# Patient Record
Sex: Male | Born: 1960 | Race: White | Hispanic: No | Marital: Single | Smoking: Never smoker
Health system: Southern US, Community
[De-identification: ages and names within clinical notes are randomized; demographics above are authoritative.]

## PROBLEM LIST (undated history)

## (undated) DIAGNOSIS — T4145XA Adverse effect of unspecified anesthetic, initial encounter: Secondary | ICD-10-CM

## (undated) DIAGNOSIS — R519 Headache, unspecified: Secondary | ICD-10-CM

## (undated) DIAGNOSIS — F200 Paranoid schizophrenia: Secondary | ICD-10-CM

## (undated) DIAGNOSIS — Z21 Asymptomatic human immunodeficiency virus [HIV] infection status: Secondary | ICD-10-CM

## (undated) DIAGNOSIS — E7800 Pure hypercholesterolemia, unspecified: Secondary | ICD-10-CM

## (undated) DIAGNOSIS — I509 Heart failure, unspecified: Secondary | ICD-10-CM

## (undated) DIAGNOSIS — J189 Pneumonia, unspecified organism: Secondary | ICD-10-CM

## (undated) DIAGNOSIS — E119 Type 2 diabetes mellitus without complications: Secondary | ICD-10-CM

## (undated) DIAGNOSIS — Z87442 Personal history of urinary calculi: Secondary | ICD-10-CM

## (undated) DIAGNOSIS — M1990 Unspecified osteoarthritis, unspecified site: Secondary | ICD-10-CM

## (undated) DIAGNOSIS — I639 Cerebral infarction, unspecified: Secondary | ICD-10-CM

## (undated) DIAGNOSIS — C469 Kaposi's sarcoma, unspecified: Secondary | ICD-10-CM

## (undated) DIAGNOSIS — F329 Major depressive disorder, single episode, unspecified: Secondary | ICD-10-CM

## (undated) DIAGNOSIS — K219 Gastro-esophageal reflux disease without esophagitis: Secondary | ICD-10-CM

## (undated) DIAGNOSIS — F32A Depression, unspecified: Secondary | ICD-10-CM

## (undated) DIAGNOSIS — I10 Essential (primary) hypertension: Secondary | ICD-10-CM

## (undated) DIAGNOSIS — F419 Anxiety disorder, unspecified: Secondary | ICD-10-CM

## (undated) DIAGNOSIS — F22 Delusional disorders: Secondary | ICD-10-CM

## (undated) DIAGNOSIS — D649 Anemia, unspecified: Secondary | ICD-10-CM

## (undated) DIAGNOSIS — G459 Transient cerebral ischemic attack, unspecified: Secondary | ICD-10-CM

## (undated) DIAGNOSIS — N189 Chronic kidney disease, unspecified: Secondary | ICD-10-CM

## (undated) DIAGNOSIS — G4730 Sleep apnea, unspecified: Secondary | ICD-10-CM

## (undated) DIAGNOSIS — B20 Human immunodeficiency virus [HIV] disease: Secondary | ICD-10-CM

## (undated) DIAGNOSIS — I219 Acute myocardial infarction, unspecified: Secondary | ICD-10-CM

## (undated) HISTORY — PX: DG TEETH FULL: HXRAD118

## (undated) HISTORY — DX: Unspecified osteoarthritis, unspecified site: M19.90

## (undated) HISTORY — PX: CARDIAC CATHETERIZATION: SHX172

## (undated) HISTORY — DX: Heart failure, unspecified: I50.9

---

## 1998-01-11 ENCOUNTER — Encounter: Admission: RE | Admit: 1998-01-11 | Discharge: 1998-01-11 | Payer: Self-pay | Admitting: Hematology and Oncology

## 1998-08-21 ENCOUNTER — Ambulatory Visit (HOSPITAL_COMMUNITY): Admission: RE | Admit: 1998-08-21 | Discharge: 1998-08-21 | Payer: Self-pay | Admitting: Internal Medicine

## 1998-08-21 ENCOUNTER — Encounter: Admission: RE | Admit: 1998-08-21 | Discharge: 1998-08-21 | Payer: Self-pay | Admitting: Internal Medicine

## 1998-09-11 ENCOUNTER — Encounter: Admission: RE | Admit: 1998-09-11 | Discharge: 1998-09-11 | Payer: Self-pay | Admitting: Internal Medicine

## 2000-01-14 ENCOUNTER — Encounter: Payer: Self-pay | Admitting: Hematology and Oncology

## 2000-01-14 ENCOUNTER — Emergency Department (HOSPITAL_COMMUNITY): Admission: EM | Admit: 2000-01-14 | Discharge: 2000-01-14 | Payer: Self-pay | Admitting: Emergency Medicine

## 2000-01-14 ENCOUNTER — Ambulatory Visit (HOSPITAL_COMMUNITY): Admission: RE | Admit: 2000-01-14 | Discharge: 2000-01-14 | Payer: Self-pay | Admitting: Hematology and Oncology

## 2000-01-14 ENCOUNTER — Encounter: Admission: RE | Admit: 2000-01-14 | Discharge: 2000-01-14 | Payer: Self-pay | Admitting: Hematology and Oncology

## 2000-02-11 ENCOUNTER — Encounter: Admission: RE | Admit: 2000-02-11 | Discharge: 2000-02-11 | Payer: Self-pay | Admitting: Internal Medicine

## 2000-09-17 ENCOUNTER — Encounter: Admission: RE | Admit: 2000-09-17 | Discharge: 2000-09-17 | Payer: Self-pay | Admitting: Hematology and Oncology

## 2004-07-12 ENCOUNTER — Inpatient Hospital Stay: Payer: Self-pay | Admitting: Internal Medicine

## 2004-07-15 ENCOUNTER — Ambulatory Visit: Payer: Self-pay | Admitting: Unknown Physician Specialty

## 2004-08-19 ENCOUNTER — Ambulatory Visit: Payer: Self-pay | Admitting: Internal Medicine

## 2004-08-28 ENCOUNTER — Ambulatory Visit: Payer: Medicaid Other | Admitting: Internal Medicine

## 2006-04-06 ENCOUNTER — Ambulatory Visit: Payer: Self-pay | Admitting: Internal Medicine

## 2007-03-04 ENCOUNTER — Ambulatory Visit: Payer: Self-pay

## 2007-04-27 ENCOUNTER — Ambulatory Visit: Payer: Self-pay | Admitting: Internal Medicine

## 2007-05-17 ENCOUNTER — Ambulatory Visit: Payer: Self-pay | Admitting: Internal Medicine

## 2007-12-14 ENCOUNTER — Ambulatory Visit: Payer: Self-pay

## 2007-12-21 ENCOUNTER — Emergency Department: Payer: Self-pay | Admitting: Emergency Medicine

## 2008-01-11 ENCOUNTER — Ambulatory Visit: Payer: Self-pay | Admitting: Internal Medicine

## 2008-02-21 ENCOUNTER — Ambulatory Visit: Payer: Self-pay | Admitting: Internal Medicine

## 2008-02-29 ENCOUNTER — Ambulatory Visit: Payer: Self-pay | Admitting: Internal Medicine

## 2008-03-30 ENCOUNTER — Ambulatory Visit: Payer: Self-pay | Admitting: Internal Medicine

## 2009-08-14 ENCOUNTER — Ambulatory Visit: Payer: Self-pay | Admitting: Gastroenterology

## 2009-09-27 ENCOUNTER — Ambulatory Visit: Payer: Self-pay | Admitting: Gastroenterology

## 2009-11-27 ENCOUNTER — Emergency Department: Payer: Self-pay | Admitting: Unknown Physician Specialty

## 2009-12-18 ENCOUNTER — Ambulatory Visit: Payer: Self-pay | Admitting: Surgery

## 2010-04-04 ENCOUNTER — Emergency Department: Payer: Self-pay | Admitting: Emergency Medicine

## 2010-07-21 ENCOUNTER — Emergency Department: Payer: Self-pay | Admitting: Internal Medicine

## 2011-04-20 ENCOUNTER — Emergency Department: Payer: Self-pay | Admitting: *Deleted

## 2011-05-11 ENCOUNTER — Emergency Department: Payer: Self-pay | Admitting: Emergency Medicine

## 2011-07-05 ENCOUNTER — Emergency Department: Payer: Self-pay | Admitting: Emergency Medicine

## 2011-07-22 ENCOUNTER — Ambulatory Visit: Payer: Self-pay | Admitting: Gastroenterology

## 2011-09-25 ENCOUNTER — Emergency Department: Payer: Self-pay | Admitting: Emergency Medicine

## 2011-09-25 LAB — URINALYSIS, COMPLETE
Bilirubin,UR: NEGATIVE
Ketone: NEGATIVE
Nitrite: NEGATIVE
Protein: 100
RBC,UR: 2 /HPF (ref 0–5)
Specific Gravity: 1.025 (ref 1.003–1.030)
Squamous Epithelial: <1

## 2011-11-07 ENCOUNTER — Emergency Department: Payer: Self-pay | Admitting: *Deleted

## 2011-11-09 ENCOUNTER — Emergency Department: Payer: Self-pay | Admitting: Internal Medicine

## 2012-03-15 ENCOUNTER — Observation Stay: Payer: Self-pay | Admitting: Internal Medicine

## 2012-03-15 LAB — MAGNESIUM: Magnesium: 1.3 mg/dL — ABNORMAL LOW

## 2012-03-15 LAB — DRUG SCREEN, URINE

## 2012-03-15 LAB — CK TOTAL AND CKMB (NOT AT ARMC)
CK, Total: 45 U/L (ref 35–232)
CK, Total: 48 U/L (ref 35–232)
CK, Total: 73 U/L (ref 35–232)
CK-MB: 2.1 ng/mL (ref 0.5–3.6)
CK-MB: 2.8 ng/mL (ref 0.5–3.6)
CK-MB: 3.5 ng/mL (ref 0.5–3.6)

## 2012-03-15 LAB — COMPREHENSIVE METABOLIC PANEL
Albumin: 2.8 g/dL — ABNORMAL LOW (ref 3.4–5.0)
Alkaline Phosphatase: 86 U/L (ref 50–136)
Anion Gap: 13 (ref 7–16)
BUN: 22 mg/dL — ABNORMAL HIGH (ref 7–18)
Bilirubin,Total: 0.3 mg/dL (ref 0.2–1.0)
Calcium, Total: 8.9 mg/dL (ref 8.5–10.1)
Chloride: 101 mmol/L (ref 98–107)
Co2: 27 mmol/L (ref 21–32)
Creatinine: 1.35 mg/dL — ABNORMAL HIGH (ref 0.60–1.30)
EGFR (African American): >60
EGFR (Non-African Amer.): 60 — ABNORMAL LOW
Glucose: 220 mg/dL — ABNORMAL HIGH (ref 65–99)
Osmolality: 291 (ref 275–301)
Potassium: 3.7 mmol/L (ref 3.5–5.1)
SGOT(AST): 11 U/L — ABNORMAL LOW (ref 15–37)
SGPT (ALT): 37 U/L (ref 12–78)
Sodium: 141 mmol/L (ref 136–145)
Total Protein: 5.9 g/dL — ABNORMAL LOW (ref 6.4–8.2)

## 2012-03-15 LAB — CBC
HCT: 39.5 % — ABNORMAL LOW (ref 40.0–52.0)
HGB: 13.8 g/dL (ref 13.0–18.0)
MCH: 34.9 pg — ABNORMAL HIGH (ref 26.0–34.0)
MCHC: 35 g/dL (ref 32.0–36.0)
MCV: 100 fL (ref 80–100)
Platelet: 188 10*3/uL (ref 150–440)
RBC: 3.96 10*6/uL — ABNORMAL LOW (ref 4.40–5.90)
RDW: 14 % (ref 11.5–14.5)
WBC: 7.2 10*3/uL (ref 3.8–10.6)

## 2012-03-15 LAB — TROPONIN I
Troponin-I: <0.02 ng/mL
Troponin-I: <0.02 ng/mL

## 2012-03-15 LAB — TSH: Thyroid Stimulating Horm: 0.68 u[IU]/mL

## 2012-03-16 LAB — CBC WITH DIFFERENTIAL/PLATELET
Basophil #: 0 10*3/uL (ref 0.0–0.1)
Eosinophil #: 0.1 10*3/uL (ref 0.0–0.7)
Eosinophil %: 1.2 %
HGB: 14.5 g/dL (ref 13.0–18.0)
Lymphocyte %: 35.7 %
MCH: 34.8 pg — ABNORMAL HIGH (ref 26.0–34.0)
MCHC: 35 g/dL (ref 32.0–36.0)
Monocyte #: 0.6 x10 3/mm (ref 0.2–1.0)
Neutrophil %: 54.3 %
Platelet: 211 10*3/uL (ref 150–440)

## 2012-03-16 LAB — BASIC METABOLIC PANEL
Co2: 30 mmol/L (ref 21–32)
Creatinine: 1.04 mg/dL (ref 0.60–1.30)
EGFR (African American): >60
EGFR (Non-African Amer.): >60
Potassium: 3.9 mmol/L (ref 3.5–5.1)
Sodium: 136 mmol/L (ref 136–145)

## 2012-04-15 ENCOUNTER — Emergency Department: Payer: Self-pay | Admitting: Unknown Physician Specialty

## 2012-04-15 LAB — URINALYSIS, COMPLETE
Bacteria: NONE SEEN
Glucose,UR: 150 mg/dL (ref 0–75)
Nitrite: NEGATIVE
Ph: 5 (ref 4.5–8.0)
Protein: 100
Specific Gravity: 1.009 (ref 1.003–1.030)
Squamous Epithelial: NONE SEEN
WBC UR: 1 /HPF (ref 0–5)

## 2012-04-15 LAB — COMPREHENSIVE METABOLIC PANEL
Albumin: 3.5 g/dL (ref 3.4–5.0)
Alkaline Phosphatase: 122 U/L (ref 50–136)
Anion Gap: 11 (ref 7–16)
BUN: 20 mg/dL — ABNORMAL HIGH (ref 7–18)
Bilirubin,Total: 0.5 mg/dL (ref 0.2–1.0)
Calcium, Total: 9.6 mg/dL (ref 8.5–10.1)
Chloride: 97 mmol/L — ABNORMAL LOW (ref 98–107)
Creatinine: 1.05 mg/dL (ref 0.60–1.30)
EGFR (Non-African Amer.): >60
Glucose: 180 mg/dL — ABNORMAL HIGH (ref 65–99)
Potassium: 3.6 mmol/L (ref 3.5–5.1)
Sodium: 136 mmol/L (ref 136–145)
Total Protein: 7.3 g/dL (ref 6.4–8.2)

## 2012-04-15 LAB — CBC WITH DIFFERENTIAL/PLATELET
Basophil #: 0.1 10*3/uL (ref 0.0–0.1)
Eosinophil %: 1.6 %
HCT: 44 % (ref 40.0–52.0)
Lymphocyte #: 3.2 10*3/uL (ref 1.0–3.6)
MCV: 98 fL (ref 80–100)
Monocyte #: 0.8 x10 3/mm (ref 0.2–1.0)
Monocyte %: 8.9 %
Neutrophil %: 52.3 %
Platelet: 260 10*3/uL (ref 150–440)
RBC: 4.48 10*6/uL (ref 4.40–5.90)
RDW: 13.6 % (ref 11.5–14.5)
WBC: 8.7 10*3/uL (ref 3.8–10.6)

## 2012-04-15 LAB — TROPONIN I: Troponin-I: <0.02 ng/mL

## 2012-12-26 ENCOUNTER — Observation Stay: Payer: Self-pay | Admitting: Internal Medicine

## 2012-12-26 LAB — CBC
HGB: 13.5 g/dL (ref 13.0–18.0)
MCH: 33.1 pg (ref 26.0–34.0)
RBC: 4.07 10*6/uL — ABNORMAL LOW (ref 4.40–5.90)
RDW: 13.1 % (ref 11.5–14.5)
WBC: 8.4 10*3/uL (ref 3.8–10.6)

## 2012-12-26 LAB — URINALYSIS, COMPLETE
Glucose,UR: NEGATIVE mg/dL (ref 0–75)
Ketone: NEGATIVE
Nitrite: NEGATIVE
Ph: 6 (ref 4.5–8.0)
RBC,UR: 3 /HPF (ref 0–5)
Squamous Epithelial: NONE SEEN
WBC UR: 1 /HPF (ref 0–5)

## 2012-12-26 LAB — COMPREHENSIVE METABOLIC PANEL
Albumin: 3 g/dL — ABNORMAL LOW (ref 3.4–5.0)
BUN: 10 mg/dL (ref 7–18)
Calcium, Total: 9 mg/dL (ref 8.5–10.1)
Creatinine: 0.99 mg/dL (ref 0.60–1.30)
EGFR (African American): >60
Glucose: 119 mg/dL — ABNORMAL HIGH (ref 65–99)
Osmolality: 276 (ref 275–301)
Potassium: 4.5 mmol/L (ref 3.5–5.1)
SGOT(AST): 29 U/L (ref 15–37)
Sodium: 138 mmol/L (ref 136–145)
Total Protein: 6.7 g/dL (ref 6.4–8.2)

## 2012-12-26 LAB — CK TOTAL AND CKMB (NOT AT ARMC)
CK, Total: 65 U/L (ref 35–232)
CK-MB: 2.2 ng/mL (ref 0.5–3.6)

## 2012-12-27 LAB — BASIC METABOLIC PANEL
Anion Gap: 6 — ABNORMAL LOW (ref 7–16)
Calcium, Total: 9.1 mg/dL (ref 8.5–10.1)
EGFR (Non-African Amer.): >60
Potassium: 3.9 mmol/L (ref 3.5–5.1)
Sodium: 138 mmol/L (ref 136–145)

## 2012-12-27 LAB — CBC WITH DIFFERENTIAL/PLATELET
Eosinophil %: 4.1 %
MCH: 33.3 pg (ref 26.0–34.0)
MCV: 94 fL (ref 80–100)
Monocyte #: 0.7 x10 3/mm (ref 0.2–1.0)
Neutrophil #: 3.8 10*3/uL (ref 1.4–6.5)
RDW: 13 % (ref 11.5–14.5)
WBC: 7.2 10*3/uL (ref 3.8–10.6)

## 2012-12-27 LAB — CK TOTAL AND CKMB (NOT AT ARMC)
CK, Total: 55 U/L (ref 35–232)
CK-MB: 1.4 ng/mL (ref 0.5–3.6)

## 2012-12-27 LAB — TROPONIN I: Troponin-I: <0.02 ng/mL

## 2013-02-20 ENCOUNTER — Emergency Department: Payer: Self-pay | Admitting: Emergency Medicine

## 2013-02-20 LAB — COMPREHENSIVE METABOLIC PANEL
Albumin: 2.9 g/dL — ABNORMAL LOW (ref 3.4–5.0)
Alkaline Phosphatase: 130 U/L (ref 50–136)
Bilirubin,Total: 0.3 mg/dL (ref 0.2–1.0)
Calcium, Total: 8.6 mg/dL (ref 8.5–10.1)
Chloride: 105 mmol/L (ref 98–107)
Co2: 26 mmol/L (ref 21–32)
EGFR (African American): >60
EGFR (Non-African Amer.): >60
Glucose: 160 mg/dL — ABNORMAL HIGH (ref 65–99)
Osmolality: 279 (ref 275–301)
SGOT(AST): 17 U/L (ref 15–37)
SGPT (ALT): 23 U/L (ref 12–78)
Sodium: 138 mmol/L (ref 136–145)
Total Protein: 6.5 g/dL (ref 6.4–8.2)

## 2013-02-20 LAB — CBC
HCT: 38.8 % — ABNORMAL LOW (ref 40.0–52.0)
HGB: 13.4 g/dL (ref 13.0–18.0)
MCV: 93 fL (ref 80–100)
Platelet: 200 10*3/uL (ref 150–440)
WBC: 8.3 10*3/uL (ref 3.8–10.6)

## 2013-02-20 LAB — URINALYSIS, COMPLETE
Bacteria: NONE SEEN
Blood: NEGATIVE
Leukocyte Esterase: NEGATIVE
Nitrite: NEGATIVE
Protein: 150
RBC,UR: NONE SEEN /HPF (ref 0–5)
WBC UR: <1 /HPF (ref 0–5)

## 2013-02-21 ENCOUNTER — Ambulatory Visit: Payer: Self-pay | Admitting: Internal Medicine

## 2013-03-25 ENCOUNTER — Emergency Department: Payer: Self-pay | Admitting: Emergency Medicine

## 2013-03-25 LAB — COMPREHENSIVE METABOLIC PANEL
Alkaline Phosphatase: 162 U/L — ABNORMAL HIGH (ref 50–136)
Anion Gap: 8 (ref 7–16)
BUN: 15 mg/dL (ref 7–18)
Bilirubin,Total: 0.3 mg/dL (ref 0.2–1.0)
Calcium, Total: 9.4 mg/dL (ref 8.5–10.1)
Chloride: 102 mmol/L (ref 98–107)
Co2: 26 mmol/L (ref 21–32)
Creatinine: 1.26 mg/dL (ref 0.60–1.30)
EGFR (African American): >60
EGFR (Non-African Amer.): >60
Glucose: 249 mg/dL — ABNORMAL HIGH (ref 65–99)
Osmolality: 281 (ref 275–301)
Potassium: 4.2 mmol/L (ref 3.5–5.1)
SGOT(AST): 19 U/L (ref 15–37)
SGPT (ALT): 27 U/L (ref 12–78)
Sodium: 136 mmol/L (ref 136–145)

## 2013-03-25 LAB — URINALYSIS, COMPLETE
Bilirubin,UR: NEGATIVE
Glucose,UR: >=500 mg/dL (ref 0–75)
Hyaline Cast: 2
Ketone: NEGATIVE
Leukocyte Esterase: NEGATIVE
Nitrite: NEGATIVE
Ph: 5 (ref 4.5–8.0)
Protein: >=500

## 2013-03-25 LAB — CBC
HCT: 42.1 % (ref 40.0–52.0)
HGB: 14.5 g/dL (ref 13.0–18.0)
MCH: 31.7 pg (ref 26.0–34.0)
MCHC: 34.4 g/dL (ref 32.0–36.0)
RBC: 4.56 10*6/uL (ref 4.40–5.90)
WBC: 11.4 10*3/uL — ABNORMAL HIGH (ref 3.8–10.6)

## 2013-03-26 LAB — URINE CULTURE

## 2014-03-22 ENCOUNTER — Emergency Department: Payer: Self-pay | Admitting: Emergency Medicine

## 2014-04-03 ENCOUNTER — Emergency Department: Payer: Self-pay | Admitting: Emergency Medicine

## 2014-06-07 ENCOUNTER — Emergency Department: Payer: Self-pay | Admitting: Emergency Medicine

## 2014-06-07 LAB — CBC
HCT: 40 % (ref 40.0–52.0)
HGB: 13.5 g/dL (ref 13.0–18.0)
MCH: 33.5 pg (ref 26.0–34.0)
MCHC: 33.7 g/dL (ref 32.0–36.0)
MCV: 99 fL (ref 80–100)
Platelet: 195 10*3/uL (ref 150–440)
RBC: 4.02 10*6/uL — ABNORMAL LOW (ref 4.40–5.90)
RDW: 13 % (ref 11.5–14.5)
WBC: 8 10*3/uL (ref 3.8–10.6)

## 2014-06-07 LAB — COMPREHENSIVE METABOLIC PANEL
ALK PHOS: 84 U/L
AST: 13 U/L — AB (ref 15–37)
Albumin: 3.4 g/dL (ref 3.4–5.0)
Anion Gap: 6 — ABNORMAL LOW (ref 7–16)
BUN: 18 mg/dL (ref 7–18)
Bilirubin,Total: 0.3 mg/dL (ref 0.2–1.0)
CALCIUM: 9 mg/dL (ref 8.5–10.1)
CHLORIDE: 105 mmol/L (ref 98–107)
CO2: 29 mmol/L (ref 21–32)
Creatinine: 1.19 mg/dL (ref 0.60–1.30)
EGFR (African American): >60
EGFR (Non-African Amer.): >60
Glucose: 176 mg/dL — ABNORMAL HIGH (ref 65–99)
OSMOLALITY: 286 (ref 275–301)
POTASSIUM: 3.8 mmol/L (ref 3.5–5.1)
SGPT (ALT): 19 U/L
Sodium: 140 mmol/L (ref 136–145)
TOTAL PROTEIN: 6.9 g/dL (ref 6.4–8.2)

## 2014-10-17 NOTE — H&P (Signed)
PATIENT NAME:  Danny Cook, RISDON MR#:  [id number] DATE OF BIRTH:  05-18-1961  DATE OF ADMISSION:  03/15/2012  PRIMARY CARE PHYSICIAN: Dr. Ginette Pitman ER PHYSICIAN: Dr. Beather Arbour ADMITTING PHYSICIAN: Dr. Pearletha Furl  PRESENTING COMPLAINT: Chest pain which started this evening.   HISTORY OF PRESENT ILLNESS: Patient is a 54 year old male who was in his usual state of health until this evening when he started having chest pain. Pain was sharp in nature, precordial in location, intensity 7/10 radiating up to the neck, associated dizziness, nausea but no vomiting, no loss of consciousness. Denies PND, orthopnea, or pedal edema. Denies any prior episodes of similar chest pain. Had some episodes of dizziness which patient states is chronic for him. However, with recurrence of the pain, duration more than 50 minutes he presented to the Emergency Room where he was given nitrates and has some resolution of the pain. Patient took some aspirin at home prior to arrival in the ER. Currently he is chest pain-free. Workup included a chest x-ray which showed no abnormality and EKG which shows sinus tachycardia, rate of 103. Blood pressure is stable. Denies any recent change in medication, long distance travel, sick contact. No rashes on the anterior chest wall.   REVIEW OF SYSTEMS: CONSTITUTIONAL: Denies any fever, fatigue. Denies any weakness. Admits to chest pain only. No weight loss or weight gain. EYES: No blurred vision, redness or discharge. ENT: No tinnitus, epistaxis, or difficulty swallowing. RESPIRATORY: Has some cough which is nonproductive. Denies any painful respiration. CARDIOVASCULAR: Positive for the chest pain but denies any syncopal episode, palpitations, PND, orthopnea. GASTROINTESTINAL: Positive for nausea but no vomiting or diarrhea. Admits occasional abdominal pain with feeds. No change in bowel habits. GENITOURINARY: No dysuria, frequency, incontinence. ENDOCRINE: No polyuria, polydipsia, heat or cold intolerance.  HEMATOLOGIC: No anemia, easy bruising, bleeding, or swollen glands. SKIN: No rashes, change in hair or skin texture. MUSCULOSKELETAL: No joint pain, redness, swelling, or limited activity. NEUROLOGIC: No numbness, weakness, headaches, seizure, or memory loss. PSYCH: Admits of anxiety but no depression or nervousness.   PAST MEDICAL HISTORY:  1. Type 2 diabetes.  2. Hyperlipidemia.  3. Obstructive sleep apnea. 4. Gastroesophageal reflux disease. 5. Generalized anxiety with paranoid ideation. 6. History of depression. 7. History of HIV since 1990, being followed by Dr. Clayborn Bigness, infection disease, currently on HAART therapy. Viral load undetectable and CD4 count more than 1000. 8. Had history of chronic kidney disease secondary to Tenofovir medication with improvement in renal function after drug was discontinued.  9. History of panic disorder with agoraphobia and chronic posttraumatic stress disorder.   PAST SURGICAL HISTORY: No significant past surgical history.   SOCIAL HISTORY: Lives alone. Denies any alcohol, tobacco, or recreational drug use. Has remote history of drug use more than 20 years ago. Currently unemployed. 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    FAMILY HISTORY: Positive for anxiety and depression in the mother. Coronary artery disease in both parents. Colon cancer.   ALLERGIES: Combivir, Viread.    MEDICATIONS:  1. Aspirin 325 mg daily.  2. Meloxicam 15 mg daily.  3. Lisinopril 2.5 mg once a day. 4. Clonazepam 1 mg t.i.d.   5. Mirtazapine 30 mg at bedtime.  6. Metformin 500 mg twice daily.  7. Meclizine 25 mg twice daily p.r.n.  8. Promethazine 25 mg q.6h.  9. Crestor 20 mg at bedtime. 10. Niaspan ER 500 mg at bedtime.  11. Trilipix 45 mg daily.  12.  Abilify 10 mg daily.  13. Epzicom 600 mg/300 mg 1 tablet twice a day. 14. Kaletra 200 mg/50 mg 2 tablets twice a day.  15. Nevirapine 200 mg twice a day. 16. Viramune 200 mg  twice daily.  17. Hydroxyzine 25 mg 1 to 2 tablets 4 times daily p.r.n. for itching.  18. Lunesta 3 mg at bedtime.  19. ProAir inhaler 2 puffs q.6 hours p.r.n.  20. Symbicort 80 mcg 2 puffs twice daily.  21. Lasix 40 mg daily.  22. Potassium chloride 10 mEq twice daily.  23. Flexeril 10 mg twice daily p.r.n.  24. Omeprazole 40 mg twice daily.  25. Lovaza 500 mg daily. 26. Paroxetine 4 mg daily.   PHYSICAL EXAMINATION:  VITAL SIGNS: Temperature record 95.4 on arrival now is 98, pulse 103, respiratory rate 20, blood pressure 145/86, sats 95% on room air.   GENERAL: Obese, middle-aged male lying comfortably on the gurney, awake, alert, oriented to time, place, and person, in no distress.   HEENT: Atraumatic, normocephalic. Pupils equal, reactive to light and accommodation. Extraocular movement intact. Mucous membranes pink, moist.   NECK: Supple. No JV distention.   CHEST: Good air entry. Clear to auscultation.   HEART: Regular rate and rhythm. No murmur.   ABDOMEN: Obese, distended, moves with respiration, nontender. Bowel sounds normoactive. No organomegaly.   EXTREMITIES: No edema. No clubbing. No deformity.   NEUROLOGICAL: Cranial nerves II through XII grossly intact. No focal deficits.   PSYCH: Affect appropriate to situation.   LABORATORY, DIAGNOSTIC, AND RADIOLOGICAL DATA: EKG shows sinus tachycardia, rate of 100, lateral ST depression in V4 to V6, left atrial enlargement. Chest x-ray showed no obvious infiltrates. CBC: White count 7, hemoglobin 13, platelets 188. Chemistry unremarkable. Creatinine 1.3 from baseline 1.2 from two years ago. BUN 22, glucose 220, calcium 8.9, potassium 3.7, sodium 141, albumin 2.8. LFTs are not elevated. CK 73. Troponin negative. Records shows negative stress test in 2006 during which time he had presented to the Emergency Room with similar complaints.   IMPRESSION:  1. Chest pain, to rule out ACS versus arrhythmia, consider anxiety with  gastritis.  2. Uncontrolled diabetes.  3. Obstructive sleep apnea, not on CPAP or oxygen.  4. Gastroesophageal reflux disease probably exacerbated by NSAID use.  5. Hyperlipidemia, stable.  6. History of HIV infection on HAART therapy with undetectable viral load and CD4 count more than 1000 from last check two months ago according to patient.  7. History of anxiety, depression, panic disorder.   PLAN:  1. Admit to general medical floor for serial cardiac enzymes. Check TSH, magnesium.  2. Check d-dimer and if elevated for CT angiogram.  3. Aspirin, nitroglycerin p.r.n. morphine. Telemonitoring overnight. Serial cardiac enzymes. Stress test in a.m.  4. Resume rest of outpatient medications and adjust as needed. Will hold his Lasix tonight. Hold the Glucophage. Hold meloxicam, potassium chloride for now. 5. GI prophylaxis with Protonix. Deep venous thrombosis prophylaxis with Lovenox.  6. CODE STATUS: FULL CODE.   TOTAL PATIENT CARE TIME: 50 minutes. Will transfer to Dr. Linton Ham service in the morning.   ____________________________ Jules Husbands. Pearletha Furl, MD mia:cms D: 03/15/2012 01:53:30 ET T: 03/15/2012 06:57:48 ET JOB#: [id number]  cc: Hamdi Kley I. Pearletha Furl, MD, <Dictator> Tracie Harrier, MD Carola Frost MD ELECTRONICALLY SIGNED 03/15/2012 22:33

## 2014-10-17 NOTE — Discharge Summary (Signed)
PATIENT NAME:  Danny Cook MR#:  [id number] DATE OF BIRTH:  05/30/1961  DATE OF ADMISSION:  03/15/2012 DATE OF DISCHARGE:  03/16/2012  DISCHARGE DIAGNOSES:  1. Atypical chest pain, myocardial infarction ruled out. Lexiscan stress test negative for ischemia or arrhythmia.  2. Anxiety.  3. Depression.  4. Type 2 diabetes.  5. History of hyperlipidemia.  6. Obstructive sleep apnea syndrome.  7. Gastroesophageal reflux disease.   CHIEF COMPLAINT: Chest pain.   HISTORY OF PRESENT ILLNESS: Danny Cook is a 54 year old male who presented to the ED complaining of chest pain. The patient states that the pain was around 7/10 in intensity, radiating to the neck associated with dizziness, nausea, and described as sharp in nature. The patient denies any previous such episodes. Denies any pedal edema, orthopnea or paroxysmal nocturnal dyspnea. The patient took aspirin at home and was chest pain free when he came to the Emergency Room.   PAST MEDICAL HISTORY:  1. Type 2 diabetes. 2. Hyperlipidemia. 3. Obstructive sleep apnea.   4. Gastroesophageal reflux disease.  5. Anxiety. 6. Paranoid ideation. 7. History of depression. 8. History of human immunodeficiency virus followed by Dr. Clayborn Bigness. 9. History of chronic kidney disease. 10. History of panic disorder and agoraphobia and chronic posttraumatic stress disorder.   PHYSICAL EXAMINATION: GENERAL: He was anxious. VS: Afebrile, respirations 20, pulse 103, blood pressure 144/86, oxygen saturation 95%, not in distress. HEENT: Normocephalic, atraumatic. Mucous membranes were moist. NECK: Supple. No JVD. HEART: S1, S2. LUNGS: Clear to auscultation. ABDOMEN: Soft, obese, nontender. No masses felt. EXTREMITIES: No edema. NEUROLOGIC: Nonfocal.   LABORATORY, RADIOLOGICAL AND DIAGNOSTIC DATA: EKG showed sinus tachycardia with heart rate of 100. ST depression was noted in V4-V6 with left atrial enlargement. Chest x-ray did not show any infiltrates.  Hemoglobin 13, WBC count 7, platelets 188, creatinine 1.3, BUN 22, glucose 220, CPK 73. Troponin was negative.   HOSPITAL COURSE: The patient was admitted to [facility], was ruled out for myocardial infarction, underwent a Lexiscan stress test which showed normal myocardial perfusion without evidence of myocardial ischemia. LV systolic function was normal, ejection fraction of 50%. There was no EKG evidence of ischemia or arrhythmia. The patient was chest pain free, was ambulated and symptomatically better. He was discharged in stable condition on the following medications.   DISCHARGE MEDICATIONS: 1. Hydroxyzine 25 mg 1 to 2 tablets every 4 to 6 hours p.r.n.  2. Kaletra 200 mg 2 tabs b.i.d.  3. Clonazepam 1 mg 3 times daily. 4. Omeprazole 40 mg b.i.d.  5. Lasix 40 mg once a day.  6. Meclizine 25 mg b.i.d. p.r.n.  7. Flexeril 10 mg b.i.d. p.r.n.  8. ProAir HFA 2 puffs q.i.d. p.r.n.  9. Metformin 500 mg b.i.d.  10. Nevirapine 200 mg 1 tab b.i.d.  11. Niaspan ER 500 mg once a day.  12. Lovaza 500 mg daily. 13. Mirtazapine 30 mg once a day at bedtime as needed.  14. Trilipix 45 mg once a day.  15. Crestor 20 mg once a day.  16. Lunesta 3 mg at bedtime. 17. Abilify 10 mg once a day. 18. Paroxetine 40 mg a day.  19. KCl 10 mEq b.i.d.  20. Lisinopril 2.5 mg once a day.  21. Symbicort 80/4.5, two puffs b.i.d.  22. Aspirin 325 mg p.o. daily.  23. Meloxicam 50 mg once a day as needed.  24. Promethazine 25 mg 1/2 tablet q.6 hours p.r.n.   DIET: The patient is advised to follow a strict  ADA diet.  FOLLOWUP:  Follow up with me, Dr. Ginette Pitman, in 1 to 2 weeks' time.  Total time spent in discharging patient was 30 minutes   ____________________________ Tracie Harrier, MD vh:ap D: 03/18/2012 13:14:20 ET T: 03/19/2012 11:29:09 ET JOB#: [id number]  cc: Tracie Harrier, MD, <Dictator> Tracie Harrier MD ELECTRONICALLY SIGNED 03/22/2012 13:07

## 2014-10-20 ENCOUNTER — Emergency Department
Admit: 2014-10-20 | Disposition: A | Discharge status: Home / Self Care | Payer: Self-pay | Admitting: Emergency Medicine

## 2014-10-20 LAB — TROPONIN I: Troponin-I: <0.03 ng/mL

## 2014-10-20 LAB — CBC
HCT: 40.3 % (ref 40.0–52.0)
HGB: 13.8 g/dL (ref 13.0–18.0)
MCH: 32.2 pg (ref 26.0–34.0)
MCHC: 34.1 g/dL (ref 32.0–36.0)
MCV: 94 fL (ref 80–100)
Platelet: 201 10*3/uL (ref 150–440)
RBC: 4.28 10*6/uL — AB (ref 4.40–5.90)
RDW: 13.2 % (ref 11.5–14.5)
WBC: 6.2 10*3/uL (ref 3.8–10.6)

## 2014-10-20 LAB — COMPREHENSIVE METABOLIC PANEL
ALBUMIN: 3.4 g/dL — AB
ALK PHOS: 87 U/L
Anion Gap: 10 (ref 7–16)
BILIRUBIN TOTAL: 0.4 mg/dL
BUN: 16 mg/dL
CREATININE: 1.07 mg/dL
Calcium, Total: 9 mg/dL
Chloride: 103 mmol/L
Co2: 22 mmol/L
EGFR (African American): >60
EGFR (Non-African Amer.): >60
Glucose: 196 mg/dL — ABNORMAL HIGH
Potassium: 3.9 mmol/L
SGOT(AST): 30 U/L
SGPT (ALT): 19 U/L
Sodium: 135 mmol/L
Total Protein: 6.7 g/dL

## 2014-10-20 LAB — URINALYSIS, COMPLETE
Bacteria: NONE SEEN
Blood: NEGATIVE
Glucose,UR: 150 mg/dL (ref 0–75)
Leukocyte Esterase: NEGATIVE
Nitrite: NEGATIVE
Ph: 5 (ref 4.5–8.0)
Protein: >=500
Specific Gravity: 1.027 (ref 1.003–1.030)
Squamous Epithelial: NONE SEEN

## 2014-10-20 NOTE — H&P (Signed)
PATIENT NAME:  Danny Cook, Danny Cook MR#:  [id number] DATE OF BIRTH:  Jul 31, 1960  DATE OF ADMISSION:  12/26/2012  ED REFERRING PHYSICIAN: Dr. Benjaman Lobe   PRIMARY CARE PHYSICIAN:  Dr. Ginette Pitman  DICTATING PHYSICIAN:  Dr. Stevenson Clinch  CHIEF COMPLAINT:  "Chest pain and heaviness earlier today."   HISTORY OF PRESENT ILLNESS:  This is a 54 year old Caucasian male with past medical history of obesity, hypertension, hyperlipidemia, HIV, obstructive sleep apnea, presenting with the above chief complaint. The patient states that he was at his group home and started noticing some chest heaviness and tightness over 15 minutes. It started to become more heavy and tight, associated with some numbness and dizziness and mild nausea, at which point he called EMS and he decided to take 2 of his sublingual nitro tablets, which relieved all his symptoms. By the time EMS got there, he had a little bit of dizziness, and by the time he got to the ED, his chest pain and tightness and heaviness had completely dissipated. Rated as 0/10 now. The patient stated initially when it started, it was about a 7/10, and symptoms are currently gone as of the time of arrival at the ED. Of note, he had a similar episode back in September 2013. He had a nuclear stress test done at that time, which showed no acute ischemic areas of his heart. He follows with Dr. Ginette Pitman. He also is noted to have obstructive sleep apnea, but has not been wearing his CPAP on a regular basis because he states that he does not like it. Hospitalist services were consulted for further inpatient workup and management.   PAST MEDICAL HISTORY:  Again significant for hypertension, obesity, HIV, obstructive sleep apnea, hyperlipidemia, depression, anxiety.    HOME MEDICATIONS ARE AS FOLLOWS:  He is on Abilify 10 mg 1 tab daily, aspirin 325 mg 1 tab daily, Clonazepam 2 mg take twice a day, Crestor 20 mg 1 tab daily, Flexeril 1 tab 3 to 4 times a day, 5 mg, Lunesta 3 mg take by mouth  nightly, triplex 45 mg DR take by mouth daily, Lasix 40 mg 1 tab by mouth as needed for leg swelling and shortness of breath, lisinopril 2.5 mg 1 tab every morning, Lovaza 1 gram take 2 tabs at night, meclizine 25 mg take by mouth as needed once a day, metformin 500 mg 1 tab b.i.d., Remeron 30 mg 1 tab at night, niacin 500 mg 1 tab at night, omeprazole 20 mg 2 tabs in the morning, 2 tabs at night, Paxil 20 mg take by mouth daily, permethrin 2.5% continuously, Klor-Con 10 mEq take by month as needed, Phenergan 25 mg 1 tab as needed.   HIS HAART REGIMEN INCLUDES THE FOLLOWING: Epzicom 600/300, 1 tab by mouth daily, Kaletra 200/50, 2 tabs by mouth twice a day, Viramune 200 mg 1 tab by mouth b.i.d.  ALLERGIES:  COMBIVIR, VIREAD.  PAST SURGICAL HISTORY:  No significant past surgical history.  SOCIAL HISTORY: He lives in a group home at times. Denies any alcohol, tobacco or recreational drug use. Has a remote history of drug use about 20 years ago. 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   FAMILY HISTORY:  Positive for anxiety and depression in mother, coronary artery disease in both parents, and colon cancer.  REVIEW OF SYSTEMS:    CONSTITUTIONAL: No fever, fatigue, weakness. Positive chest pain. No weight loss or weight gain.  EYES: No blurry vision, double vision,  pain or redness.  EARS, NOSE, THROAT:  No tinnitus, ear pain, hearing loss or seasonal allergies.  RESPIRATORY: No cough or wheezing, hemoptysis, dyspnea, asthma, painful respiration, COPD, TB or pneumonia.  CARDIOVASCULAR: Positive chest pain, positive chest heaviness. Positive high blood pressure. Otherwise, no palpitations, syncope or varicose veins.  GASTROINTESTINAL: Positive nausea. No vomiting, diarrhea, abdominal pain. Positive GERD. No hematemesis, melena, ulcers.  GENITOURINARY: No dysuria, hematuria. ENDOCRINE:  No polyuria, nocturia. No thyroid  problems. HEMATOLOGIC/LYMPHATIC:  No anemia, easy bruising, bleeding or swollen glands.  INTEGUMENTARY:  No acne, rash, change in mole, hair or skin.  MUSCULOSKELETAL: No pain in neck, back, shoulders, knees or hips.  NEUROLOGIC: Positive jaw numbness associated with this chest pain. Otherwise, no weakness, dysarthria, epilepsy, tremor or vertigo. PSYCHIATRIC: Positive anxiety, depression. No insomnia, ADD, OCD, bipolar, schizophrenia.   PHYSICAL EXAMINATION IN THE EMERGENCY DEPARTMENT VITAL SIGNS ARE AS FOLLOWS:  Temperature is 98.4, pulse is 78, respirations 22, blood pressure is 138/79, pulse ox at 97%.  GENERAL APPEARANCE: Well-developed, well-nourished, obese male lying in bed in no acute respiratory distress, who had no diaphoresis.  HEENT: PERRLA, EOMI. No scleral icterus. No conjunctivitis. No difficulty hearing.  NECK:  No thyroid problems or enlargement. Neck is supple, nontender. No adenopathy is noted.  RESPIRATORY:  Clear to auscultation bilaterally. No rales, rhonchi, crackles or diminished breath sounds.  CARDIOVASCULAR: Regular rate and rhythm. No chest wall tenderness. No murmurs are noted. No S3 no S4. Trace lower extremity edema.  ABDOMEN:  Soft, nontender. Obese abdomen. Otherwise no hepatosplenomegaly.  MUSCULOSKELETAL: Strength 5/5 bilateral upper and lower extremities. No cyanosis or DJD.   SKIN:  No rashes, change in mole, hair or skin. Skin is warm and dry.  LYMPHATIC: No adenopathy noted in the cervical, axilla or supraclavicular regions.  NEUROLOGIC: Cranial nerves II through XII intact. Deep tendon reflexes intact. Sensory is intact.  PSYCHIATRIC: The patient is alert, oriented to person, time and place, cooperative, and good judgment is noted.   LABS ARE AS FOLLOWS:  Sodium 138, potassium 4.5, chloride 105, BUN 10, creatinine 0.99, glucose 119, alk phos 141, total bilirubin 0.3. Troponin less than 0.02,  White cell count 8.4, hemoglobin 13.5, hematocrit 38.6,  platelet count 214. UA is unremarkable. He did have a portable chest x-ray that showed the following:  No definitive evidence of CHF or pneumonia. No acute cardiopulmonary abnormalities. A followup PA and lateral chest would be of value in evaluating the retrocardiac region of the left. EKG shows normal sinus rhythm, no acute ST-T wave changes, rate of about 80.   ASSESSMENT AND PLAN: A 54 year old male with past medical history of hypertension, diabetes, obstructive sleep apnea, coronary artery disease, presenting with chest pain and heaviness within 20 minutes, relieved with nitroglycerin.   1.  Chest pain, chest heaviness. Rule out ACS with arrhythmia versus gastritis or panic attack. Will monitor him on telemetry bed. Check cardiac enzymes x 3, magnesium. Continue with sublingual, nitroglycerin, aspirin and statin. Obtain a Cardiology consult in the morning. He does have a stress test that is negative from September 2014. We will continue to monitor closely.  2.  Diabetes. Will check a hemoglobin A1c, hold his metformin, and put him on insulin sliding scale.   3.  Gastroesophageal reflux disease. Start him on a proton pump inhibitor.  4.  Obstructive sleep apnea, noncompliant. He states that his mask was uncomfortable, and he does not like wearing his CPAP. He is willing to try it tonight. Will place him  on Auto PAP tonight.  5.  Human immunodeficiency virus. Continue with HAART treatment currently. He will have his friends bring his HAART medications so he can take them.  6.  The patient is a FULL CODE.  7.  Gastrointestinal and deep venous thrombosis prophylaxis. PPI and Lovenox.    ____________________________ Vilinda Boehringer, MD vm:mr D: 12/26/2012 18:06:53 ET T: 12/26/2012 19:11:18 ET JOB#: [id number]  cc: Vilinda Boehringer, MD, <Dictator> Tracie Harrier, MD Vilinda Boehringer MD ELECTRONICALLY SIGNED 12/27/2012 10:53

## 2014-10-20 NOTE — Consult Note (Signed)
   Present Illness Pt is a 54 yo male with history of hypertension, hyperlipidemia, HIV positive status who was admitted with chest pain with both typical and atypical features. He has ruled out for an mi and he has had no further chest pain since admission. He had discomfort in  the past and had a negative funcitonal study. Risk factors include hyperftension and hyperlipidemia.   Physical Exam:  GEN well developed, well nourished, no acute distress   HEENT PERRL, hearing intact to voice   NECK supple  No masses   RESP normal resp effort   CARD Regular rate and rhythm  Normal, S1, S2  No murmur   ABD denies tenderness  normal BS  no Adominal Mass   LYMPH negative neck, negative axillae   EXTR negative cyanosis/clubbing, negative edema   SKIN normal to palpation   NEURO cranial nerves intact, motor/sensory function intact   PSYCH A+O to time, place, person   Review of Systems:  Subjective/Chief Complaint admitted with chest pain. no symptoms at present.   General: No Complaints   Skin: No Complaints   ENT: No Complaints   Eyes: No Complaints   Neck: No Complaints   Respiratory: No Complaints   Cardiovascular: Chest pain or discomfort   Gastrointestinal: No Complaints   Genitourinary: No Complaints   Vascular: No Complaints   Musculoskeletal: No Complaints   Neurologic: No Complaints   Hematologic: No Complaints   Endocrine: No Complaints   Psychiatric: No Complaints   Review of Systems: All other systems were reviewed and found to be negative   Medications/Allergies Reviewed Medications/Allergies reviewed   EKG:  EKG NSR    Combivir: Other  Viread: Unknown   Impression 54 yo male with history of hypertension, sleep apnea (defers CPAP), wno was admitted with chest pain with both typical and atypical features. Has ruled out for an mi and ekg is unremarkable. Ambulating without symptoms. OK for dischage from [place]. Will see as outpatint  for consideariton for funcitonal study.   Plan 1. Conitnue current meds 2. OK for discharge form cardiac standpoint 3. WIll follow up as outpatient for consideration for funcitonal study. 4.   Electronic Signatures: Teodoro Spray (MD)  (Signed 30-Jun-14 14:32)  Authored: General Aspect/Present Illness, History and Physical Exam, Review of System, EKG , Allergies, Impression/Plan   Last Updated: 30-Jun-14 14:32 by Teodoro Spray (MD)

## 2014-10-23 ENCOUNTER — Emergency Department
Admit: 2014-10-23 | Disposition: A | Discharge status: Home / Self Care | Payer: Self-pay | Admitting: Emergency Medicine

## 2014-10-23 LAB — URINALYSIS, COMPLETE
BILIRUBIN, UR: NEGATIVE
Bacteria: NONE SEEN
Glucose,UR: NEGATIVE mg/dL (ref 0–75)
KETONE: NEGATIVE
Leukocyte Esterase: NEGATIVE
Nitrite: NEGATIVE
Ph: 6 (ref 4.5–8.0)
Protein: >=500
SQUAMOUS EPITHELIAL: NONE SEEN
Specific Gravity: 1.017 (ref 1.003–1.030)

## 2014-10-23 LAB — COMPREHENSIVE METABOLIC PANEL
ALT: 24 U/L
Albumin: 3.7 g/dL
Alkaline Phosphatase: 88 U/L
Anion Gap: 13 (ref 7–16)
BILIRUBIN TOTAL: 0.4 mg/dL
BUN: 16 mg/dL
CREATININE: 1.05 mg/dL
Calcium, Total: 9.4 mg/dL
Chloride: 101 mmol/L
Co2: 24 mmol/L
EGFR (African American): >60
EGFR (Non-African Amer.): >60
Glucose: 141 mg/dL — ABNORMAL HIGH
POTASSIUM: 4 mmol/L
SGOT(AST): 29 U/L
SODIUM: 138 mmol/L
TOTAL PROTEIN: 7 g/dL

## 2014-10-23 LAB — CBC
HCT: 39.6 % — AB (ref 40.0–52.0)
HGB: 13.8 g/dL (ref 13.0–18.0)
MCH: 32.3 pg (ref 26.0–34.0)
MCHC: 34.9 g/dL (ref 32.0–36.0)
MCV: 93 fL (ref 80–100)
Platelet: 205 10*3/uL (ref 150–440)
RBC: 4.28 10*6/uL — ABNORMAL LOW (ref 4.40–5.90)
RDW: 13 % (ref 11.5–14.5)
WBC: 9.7 10*3/uL (ref 3.8–10.6)

## 2014-10-23 LAB — SALICYLATE LEVEL: Salicylates, Serum: <4 mg/dL

## 2014-10-23 LAB — DRUG SCREEN, URINE
Amphetamines, Ur Screen: NEGATIVE
BARBITURATES, UR SCREEN: NEGATIVE
BENZODIAZEPINE, UR SCRN: POSITIVE
CANNABINOID 50 NG, UR ~~LOC~~: NEGATIVE
Cocaine Metabolite,Ur ~~LOC~~: NEGATIVE
MDMA (ECSTASY) UR SCREEN: NEGATIVE
METHADONE, UR SCREEN: NEGATIVE
Opiate, Ur Screen: NEGATIVE
Phencyclidine (PCP) Ur S: NEGATIVE
Tricyclic, Ur Screen: NEGATIVE

## 2014-10-23 LAB — ACETAMINOPHEN LEVEL

## 2014-10-23 LAB — ETHANOL: Ethanol: <5 mg/dL

## 2014-11-08 ENCOUNTER — Emergency Department
Admission: EM | Admit: 2014-11-08 | Discharge: 2014-11-09 | Disposition: A | Discharge status: Home / Self Care | Payer: Medicare Other | Attending: Emergency Medicine | Admitting: Emergency Medicine

## 2014-11-08 DIAGNOSIS — R531 Weakness: Secondary | ICD-10-CM | POA: Diagnosis present

## 2014-11-08 DIAGNOSIS — F419 Anxiety disorder, unspecified: Secondary | ICD-10-CM | POA: Diagnosis not present

## 2014-11-09 ENCOUNTER — Other Ambulatory Visit: Payer: Self-pay

## 2014-11-09 DIAGNOSIS — R531 Weakness: Secondary | ICD-10-CM | POA: Diagnosis not present

## 2014-11-09 LAB — BASIC METABOLIC PANEL
Anion gap: 16 — ABNORMAL HIGH (ref 5–15)
BUN: 18 mg/dL (ref 6–20)
CHLORIDE: 101 mmol/L (ref 101–111)
CO2: 20 mmol/L — AB (ref 22–32)
CREATININE: 1.37 mg/dL — AB (ref 0.61–1.24)
Calcium: 10 mg/dL (ref 8.9–10.3)
GFR calc Af Amer: >60 mL/min (ref >60)
GFR calc non Af Amer: 56 mL/min — ABNORMAL LOW (ref >60)
GLUCOSE: 330 mg/dL — AB (ref 65–99)
POTASSIUM: 4.4 mmol/L (ref 3.5–5.1)
Sodium: 137 mmol/L (ref 135–145)

## 2014-11-09 LAB — URINALYSIS COMPLETE WITH MICROSCOPIC (ARMC ONLY)
BILIRUBIN URINE: NEGATIVE
Bacteria, UA: NONE SEEN
Glucose, UA: >500 mg/dL — AB
LEUKOCYTES UA: NEGATIVE
Nitrite: NEGATIVE
Specific Gravity, Urine: 1.027 (ref 1.005–1.030)
pH: 5 (ref 5.0–8.0)

## 2014-11-09 LAB — CBC
HEMATOCRIT: 41 % (ref 40.0–52.0)
Hemoglobin: 13.9 g/dL (ref 13.0–18.0)
MCH: 32.1 pg (ref 26.0–34.0)
MCHC: 33.9 g/dL (ref 32.0–36.0)
MCV: 94.8 fL (ref 80.0–100.0)
Platelets: 261 10*3/uL (ref 150–440)
RBC: 4.33 MIL/uL — ABNORMAL LOW (ref 4.40–5.90)
RDW: 13.1 % (ref 11.5–14.5)
WBC: 8.7 10*3/uL (ref 3.8–10.6)

## 2014-11-09 LAB — TROPONIN I: Troponin I: <0.03 ng/mL (ref <0.031)

## 2014-11-09 NOTE — ED Notes (Signed)
Patient ambulatory to triage with steady gait, without difficulty or distress noted; pt st received several cortisone injections to lower back and right leg Wednesday; now feeling anxious, dizziness, weakness, and light-headed when lying down; st "I didn't take too much of my mind medicine because I was scared it would stop my heart and it felt like it was slowing down"

## 2014-12-15 ENCOUNTER — Other Ambulatory Visit: Payer: Self-pay

## 2014-12-15 ENCOUNTER — Emergency Department
Admission: EM | Admit: 2014-12-15 | Discharge: 2014-12-15 | Disposition: A | Discharge status: Home / Self Care | Payer: Medicare Other | Attending: Emergency Medicine | Admitting: Emergency Medicine

## 2014-12-15 ENCOUNTER — Encounter: Payer: Self-pay | Admitting: Intensive Care

## 2014-12-15 DIAGNOSIS — R079 Chest pain, unspecified: Secondary | ICD-10-CM

## 2014-12-15 DIAGNOSIS — E119 Type 2 diabetes mellitus without complications: Secondary | ICD-10-CM | POA: Insufficient documentation

## 2014-12-15 DIAGNOSIS — I10 Essential (primary) hypertension: Secondary | ICD-10-CM | POA: Diagnosis not present

## 2014-12-15 DIAGNOSIS — R0789 Other chest pain: Secondary | ICD-10-CM | POA: Diagnosis not present

## 2014-12-15 DIAGNOSIS — R42 Dizziness and giddiness: Secondary | ICD-10-CM | POA: Diagnosis present

## 2014-12-15 HISTORY — DX: Paranoid schizophrenia: F20.0

## 2014-12-15 HISTORY — DX: Type 2 diabetes mellitus without complications: E11.9

## 2014-12-15 HISTORY — DX: Major depressive disorder, single episode, unspecified: F32.9

## 2014-12-15 HISTORY — DX: Essential (primary) hypertension: I10

## 2014-12-15 HISTORY — DX: Asymptomatic human immunodeficiency virus (hiv) infection status: Z21

## 2014-12-15 HISTORY — DX: Human immunodeficiency virus (HIV) disease: B20

## 2014-12-15 HISTORY — DX: Depression, unspecified: F32.A

## 2014-12-15 LAB — CBC
HCT: 37.7 % — ABNORMAL LOW (ref 40.0–52.0)
HEMOGLOBIN: 12.8 g/dL — AB (ref 13.0–18.0)
MCH: 31.7 pg (ref 26.0–34.0)
MCHC: 33.9 g/dL (ref 32.0–36.0)
MCV: 93.6 fL (ref 80.0–100.0)
Platelets: 186 10*3/uL (ref 150–440)
RBC: 4.03 MIL/uL — AB (ref 4.40–5.90)
RDW: 13.2 % (ref 11.5–14.5)
WBC: 6.7 10*3/uL (ref 3.8–10.6)

## 2014-12-15 LAB — BASIC METABOLIC PANEL
Anion gap: 11 (ref 5–15)
BUN: 15 mg/dL (ref 6–20)
CO2: 24 mmol/L (ref 22–32)
Calcium: 9.2 mg/dL (ref 8.9–10.3)
Chloride: 102 mmol/L (ref 101–111)
Creatinine, Ser: 1.1 mg/dL (ref 0.61–1.24)
GFR calc Af Amer: >60 mL/min (ref >60)
Glucose, Bld: 135 mg/dL — ABNORMAL HIGH (ref 65–99)
Potassium: 4.2 mmol/L (ref 3.5–5.1)
Sodium: 137 mmol/L (ref 135–145)

## 2014-12-15 LAB — TROPONIN I

## 2014-12-15 NOTE — ED Provider Notes (Signed)
[facility] Emergency Department Provider Note  Time seen: 1:56 PM  I have reviewed the triage vital signs and the nursing notes.   HISTORY  Chief Complaint Dizziness    HPI Danny Cook. is a 54 y.o. male with a past medical history of HIV, diabetes, hypertension, schizophrenia who presents the emergency department with dizziness, chest pain and pressure. According the patient's approximate 1 hour prior to arrival patient states he was at home eating when he suddenly became dizzy, lightheaded, felt short of breath and had a "strange feeling" in his chest which he describes as a tingling or pressure. States the chest painis now resolved, he states he still feels a little dizzy. Denies any history of cardiac disease in the past. Denies any nausea, diaphoresis or any time. Patient states he does have a cardiologist that follows. Patient describes his chest symptoms as mild, and now resolved. No modifying factors identified.     Past Medical History  Diagnosis Date  . HIV (human immunodeficiency virus infection)   . Diabetes mellitus without complication   . Hypertension   . Depressed   . Schizophrenia, paranoid     There are no active problems to display for this patient.   Past Surgical History  Procedure Laterality Date  . Dg teeth full      No current outpatient prescriptions on file.  Allergies Viread  History reviewed. No pertinent family history.  Social History History  Substance Use Topics  . Smoking status: Never Smoker   . Smokeless tobacco: Never Used  . Alcohol Use: No    Review of Systems Constitutional: Negative for fever. Cardiovascular: Positive for chest pain, now resolved. Respiratory: Positive for shortness breath, now resolved. Gastrointestinal: Negative for abdominal pain Musculoskeletal: Negative for back pain. Neurological: Negative for headaches, focal weakness or numbness.  10-point ROS otherwise  negative.  ____________________________________________   PHYSICAL EXAM:  VITAL SIGNS: ED Triage Vitals  Enc Vitals Group     BP 12/15/14 1230 136/91 mmHg     Pulse Rate 12/15/14 1230 80     Resp 12/15/14 1230 16     Temp 12/15/14 1233 98.2 F (36.8 C)     Temp Source 12/15/14 1233 Oral     SpO2 12/15/14 1230 96 %     Weight 12/15/14 1233 236 lb (107.049 kg)     Height 12/15/14 1233 5\' 7"  (1.702 m)     Head Cir --      Peak Flow --      Pain Score --      Pain Loc --      Pain Edu? --      Excl. in [place]? --     Constitutional: Alert and oriented. Well appearing Eyes: Normal exam ENT   Head: Normocephalic and atraumatic. Cardiovascular: Normal rate, regular rhythm. No murmurs, rubs, or gallops. Respiratory: Normal respiratory effort without tachypnea nor retractions. Breath sounds are clear Gastrointestinal: Soft and nontender. No distention.   Musculoskeletal: Nontender with normal range of motion in all extremities.  Neurologic:  Normal speech and language. No gross focal neurologic deficits  Skin:  Skin is warm, dry and intact.  Psychiatric: Mood and affect are normal. Speech and behavior are normal.   ____________________________________________    EKG  EKG reviewed and interpreted by myself shows normal sinus rhythm at 78 bpm, narrow QRS, normal axis, normal intervals, no ST changes noted. Overall normal EKG.  ____________________________________________    INITIAL IMPRESSION / ASSESSMENT AND PLAN /  ED COURSE  Pertinent labs & imaging results that were available during my care of the patient were reviewed by me and considered in my medical decision making (see chart for details).  Patient presents with vague symptoms of dizziness, chest pain, which is now resolved. EKG is normal. We will check labs, and closely monitor in the emergency department. Patient agreeable to plan.  Labs within normal limits and the patient was discharged  home.  ____________________________________________   FINAL CLINICAL IMPRESSION(S) / ED DIAGNOSES  Chest pain   Harvest Dark, MD 12/17/14 [phone]

## 2014-12-15 NOTE — Discharge Instructions (Signed)
Chest Pain (Nonspecific) °It is often hard to give a specific diagnosis for the cause of chest pain. There is always a chance that your pain could be related to something serious, such as a heart attack or a blood clot in the lungs. You need to follow up with your health care provider for further evaluation. °CAUSES  °· Heartburn. °· Pneumonia or bronchitis. °· Anxiety or stress. °· Inflammation around your heart (pericarditis) or lung (pleuritis or pleurisy). °· A blood clot in the lung. °· A collapsed lung (pneumothorax). It can develop suddenly on its own (spontaneous pneumothorax) or from trauma to the chest. °· Shingles infection (herpes zoster virus). °The chest wall is composed of bones, muscles, and cartilage. Any of these can be the source of the pain. °· The bones can be bruised by injury. °· The muscles or cartilage can be strained by coughing or overwork. °· The cartilage can be affected by inflammation and become sore (costochondritis). °DIAGNOSIS  °Lab tests or other studies may be needed to find the cause of your pain. Your health care provider may have you take a test called an ambulatory electrocardiogram (ECG). An ECG records your heartbeat patterns over a 24-hour period. You may also have other tests, such as: °· Transthoracic echocardiogram (TTE). During echocardiography, sound waves are used to evaluate how blood flows through your heart. °· Transesophageal echocardiogram (TEE). °· Cardiac monitoring. This allows your health care provider to monitor your heart rate and rhythm in real time. °· Holter monitor. This is a portable device that records your heartbeat and can help diagnose heart arrhythmias. It allows your health care provider to track your heart activity for several days, if needed. °· Stress tests by exercise or by giving medicine that makes the heart beat faster. °TREATMENT  °· Treatment depends on what may be causing your chest pain. Treatment may include: °¨ Acid blockers for  heartburn. °¨ Anti-inflammatory medicine. °¨ Pain medicine for inflammatory conditions. °¨ Antibiotics if an infection is present. °· You may be advised to change lifestyle habits. This includes stopping smoking and avoiding alcohol, caffeine, and chocolate. °· You may be advised to keep your head raised (elevated) when sleeping. This reduces the chance of acid going backward from your stomach into your esophagus. °Most of the time, nonspecific chest pain will improve within 2-3 days with rest and mild pain medicine.  °HOME CARE INSTRUCTIONS  °· If antibiotics were prescribed, take them as directed. Finish them even if you start to feel better. °· For the next few days, avoid physical activities that bring on chest pain. Continue physical activities as directed. °· Do not use any tobacco products, including cigarettes, chewing tobacco, or electronic cigarettes. °· Avoid drinking alcohol. °· Only take medicine as directed by your health care provider. °· Follow your health care provider's suggestions for further testing if your chest pain does not go away. °· Keep any follow-up appointments you made. If you do not go to an appointment, you could develop lasting (chronic) problems with pain. If there is any problem keeping an appointment, call to reschedule. °SEEK MEDICAL CARE IF:  °· Your chest pain does not go away, even after treatment. °· You have a rash with blisters on your chest. °· You have a fever. °SEEK IMMEDIATE MEDICAL CARE IF:  °· You have increased chest pain or pain that spreads to your arm, neck, jaw, back, or abdomen. °· You have shortness of breath. °· You have an increasing cough, or you cough   up blood.  You have severe back or abdominal pain.  You feel nauseous or vomit.  You have severe weakness.  You faint.  You have chills. This is an emergency. Do not wait to see if the pain will go away. Get medical help at once. Call your local emergency services (911 in [place] Do not drive  yourself to the hospital. MAKE SURE YOU:   Understand these instructions.  Will watch your condition.  Will get help right away if you are not doing well or get worse. Document Released: 03/26/2005 Document Revised: 06/21/2013 Document Reviewed: 01/20/2008 [facility] Patient Information 2015 [place], [place]. This information is not intended to replace advice given to you by your health care provider. Make sure you discuss any questions you have with your health care provider.    Please follow up with your cardiologist as soon as possible to discuss further workup for your chest symptoms including possible stress test. Return to the department for any further chest pain, pressure, tightness or any other symptom personally concerning to self.

## 2014-12-15 NOTE — ED Notes (Signed)
PT arrived per EMS from home. Pt was eating at home and started to feel dizzy, lightheaded, SOB, and stated "had a strange feeling in chest". Pt denies falling or LOC. Per EMS b/p 158/101, 82 pulse, 98 RA, 133 FSBS. HX of HIV, diabetic, depression

## 2015-01-08 ENCOUNTER — Ambulatory Visit
Admission: RE | Admit: 2015-01-08 | Discharge: 2015-01-08 | Disposition: A | Discharge status: Home / Self Care | Payer: Medicare Other | Source: Ambulatory Visit | Attending: Internal Medicine | Admitting: Internal Medicine

## 2015-01-08 ENCOUNTER — Other Ambulatory Visit: Payer: Self-pay | Admitting: Internal Medicine

## 2015-01-08 DIAGNOSIS — R600 Localized edema: Secondary | ICD-10-CM | POA: Insufficient documentation

## 2015-01-08 DIAGNOSIS — M7989 Other specified soft tissue disorders: Secondary | ICD-10-CM

## 2015-02-04 ENCOUNTER — Emergency Department
Admission: EM | Admit: 2015-02-04 | Discharge: 2015-02-04 | Disposition: A | Discharge status: Home / Self Care | Payer: Medicare Other | Attending: Emergency Medicine | Admitting: Emergency Medicine

## 2015-02-04 ENCOUNTER — Emergency Department: Payer: Medicare Other

## 2015-02-04 ENCOUNTER — Other Ambulatory Visit: Payer: Self-pay

## 2015-02-04 ENCOUNTER — Encounter: Payer: Self-pay | Admitting: Emergency Medicine

## 2015-02-04 DIAGNOSIS — E119 Type 2 diabetes mellitus without complications: Secondary | ICD-10-CM | POA: Insufficient documentation

## 2015-02-04 DIAGNOSIS — R079 Chest pain, unspecified: Secondary | ICD-10-CM | POA: Insufficient documentation

## 2015-02-04 DIAGNOSIS — I10 Essential (primary) hypertension: Secondary | ICD-10-CM | POA: Diagnosis not present

## 2015-02-04 DIAGNOSIS — R0602 Shortness of breath: Secondary | ICD-10-CM | POA: Insufficient documentation

## 2015-02-04 HISTORY — DX: Pure hypercholesterolemia, unspecified: E78.00

## 2015-02-04 LAB — BASIC METABOLIC PANEL
Anion gap: 15 (ref 5–15)
BUN: 12 mg/dL (ref 6–20)
CHLORIDE: 100 mmol/L — AB (ref 101–111)
CO2: 22 mmol/L (ref 22–32)
CREATININE: 1.17 mg/dL (ref 0.61–1.24)
Calcium: 9.1 mg/dL (ref 8.9–10.3)
GFR calc Af Amer: >60 mL/min (ref >60)
GFR calc non Af Amer: >60 mL/min (ref >60)
GLUCOSE: 300 mg/dL — AB (ref 65–99)
Potassium: 4 mmol/L (ref 3.5–5.1)
SODIUM: 137 mmol/L (ref 135–145)

## 2015-02-04 LAB — CBC
HEMATOCRIT: 36.3 % — AB (ref 40.0–52.0)
Hemoglobin: 12.9 g/dL — ABNORMAL LOW (ref 13.0–18.0)
MCH: 32.7 pg (ref 26.0–34.0)
MCHC: 35.6 g/dL (ref 32.0–36.0)
MCV: 91.8 fL (ref 80.0–100.0)
PLATELETS: 199 10*3/uL (ref 150–440)
RBC: 3.96 MIL/uL — AB (ref 4.40–5.90)
RDW: 13 % (ref 11.5–14.5)
WBC: 6.5 10*3/uL (ref 3.8–10.6)

## 2015-02-04 LAB — TROPONIN I: Troponin I: <0.03 ng/mL (ref <0.031)

## 2015-02-04 NOTE — ED Provider Notes (Signed)
[facility] Emergency Department Provider Note  ____________________________________________  Time seen: Approximately 530 PM  I have reviewed the triage vital signs and the nursing notes.   HISTORY  Chief Complaint Chest Pain and Shortness of Breath    HPI Danny Cook. is a 54 y.o. male with a history of diabetes, hypertension and HIV presents today with multiple episodes of chest pain. He says that they are in the central chest and radiates to the left arm. The last several minutes at a time. There are no exacerbating factors. He says they may come on when he is just sitting and resting. He has been seen multiple times in the emergency department for these episodes. Has some associated shortness of breath with the episodes. Also with dizziness with chest pain. No nausea vomiting or diaphoresis. The episodes resolve spontaneously. He sees Dr. Ubaldo Glassing and has a stress test scheduled for August 15.He is currently pain-free. He takes a baby aspirin daily. Compliant with his HIV medications. Says that his last CD4 count was 700. No chest pain with deep inspiration.   Past Medical History  Diagnosis Date  . HIV (human immunodeficiency virus infection)   . Diabetes mellitus without complication   . Hypertension   . Depressed   . Schizophrenia, paranoid   . High cholesterol     There are no active problems to display for this patient.   Past Surgical History  Procedure Laterality Date  . Dg teeth full      No current outpatient prescriptions on file.  Allergies Viread  No family history on file.  Social History History  Substance Use Topics  . Smoking status: Never Smoker   . Smokeless tobacco: Never Used  . Alcohol Use: No    Review of Systems Constitutional: No fever/chills Eyes: No visual changes. ENT: No sore throat. Cardiovascular: As above  Respiratory: As above  Gastrointestinal: No abdominal pain.  No nausea, no vomiting.  No  diarrhea.  No constipation. Genitourinary: Negative for dysuria. Musculoskeletal: Negative for back pain. Skin: Negative for rash. Neurological: Negative for headaches, focal weakness or numbness.  10-point ROS otherwise negative.  ____________________________________________   PHYSICAL EXAM:  VITAL SIGNS: ED Triage Vitals  Enc Vitals Group     BP 02/04/15 1524 150/72 mmHg     Pulse Rate 02/04/15 1524 96     Resp 02/04/15 1524 20     Temp 02/04/15 1524 97.8 F (36.6 C)     Temp Source 02/04/15 1524 Oral     SpO2 02/04/15 1524 94 %     Weight 02/04/15 1524 238 lb (107.956 kg)     Height 02/04/15 1524 5\' 7"  (1.702 m)     Head Cir --      Peak Flow --      Pain Score 02/04/15 1524 2     Pain Loc --      Pain Edu? --      Excl. in [place]? --     Constitutional: Alert and oriented. Well appearing and in no acute distress. Eyes: Conjunctivae are normal. PERRL. EOMI. Head: Atraumatic. Nose: No congestion/rhinnorhea. Mouth/Throat: Mucous membranes are moist.  Oropharynx non-erythematous. Neck: No stridor.   Cardiovascular: Normal rate, regular rhythm. Grossly normal heart sounds.  Good peripheral circulation. Respiratory: Normal respiratory effort.  No retractions. Lungs CTAB. Gastrointestinal: Soft and nontender. No distention. No abdominal bruits. No CVA tenderness. Musculoskeletal: No lower extremity tenderness nor edema.  No joint effusions. Neurologic:  Normal speech and language.  No gross focal neurologic deficits are appreciated. No gait instability. Skin:  Skin is warm, dry and intact. No rash noted. Psychiatric: Mood and affect are normal. Speech and behavior are normal.  ____________________________________________   LABS (all labs ordered are listed, but only abnormal results are displayed)  Labs Reviewed  BASIC METABOLIC PANEL - Abnormal; Notable for the following:    Chloride 100 (*)    Glucose, Bld 300 (*)    All other components within normal limits  CBC -  Abnormal; Notable for the following:    RBC 3.96 (*)    Hemoglobin 12.9 (*)    HCT 36.3 (*)    All other components within normal limits  TROPONIN I   ____________________________________________  EKG  ED ECG REPORT I, Doran Stabler, the attending physician, personally viewed and interpreted this ECG.   Date: 02/04/2015  EKG Time: 1525  Rate: 95  Rhythm: normal sinus rhythm  Axis: Normal axis  Intervals:none  ST&T Change: No ST segment elevations or depressions. No abnormal T-wave inversions.  ____________________________________________  RADIOLOGY  Low lung volumes without any acute cardiopulmonary disease. I personally reviewed these images. ____________________________________________   PROCEDURES    ____________________________________________   INITIAL IMPRESSION / ASSESSMENT AND PLAN / ED COURSE  Pertinent labs & imaging results that were available during my care of the patient were reviewed by me and considered in my medical decision making (see chart for details).  ----------------------------------------- 7:14 PM on 02/04/2015 -----------------------------------------  Patient continues to rest comfortable he. No chest pain throughout her emergency department visit. Patient will call his cardiologist tomorrow morning to expedite his follow-up to be seen within the next 3 days. Understands that he will continue to take his 81 mg a daily aspirin along with his other medications.  Reviewed patient's last emergency department note with Dr.Paduchowski and symptoms are almost identical to current complaints. Feel that with recurrent chest pain and reassuring EKG as well as labs that the patient may be safely discharged home for outpatient follow-up. ____________________________________________   FINAL CLINICAL IMPRESSION(S) / ED DIAGNOSES  Acute chest pain. Return visit.    Orbie Pyo, MD 02/04/15 [phone]

## 2015-02-04 NOTE — Discharge Instructions (Signed)

## 2015-02-04 NOTE — ED Notes (Addendum)
Pt reports shortness of breath and chest pressure, started about an hour ago. Pt reports pain radiates to left arm. Pt with some cardiac hx, no stent placement.

## 2015-05-23 ENCOUNTER — Emergency Department: Payer: Medicare Other

## 2015-05-23 ENCOUNTER — Encounter: Payer: Self-pay | Admitting: Emergency Medicine

## 2015-05-23 ENCOUNTER — Emergency Department
Admission: EM | Admit: 2015-05-23 | Discharge: 2015-05-23 | Disposition: A | Discharge status: Home / Self Care | Payer: Medicare Other | Attending: Emergency Medicine | Admitting: Emergency Medicine

## 2015-05-23 DIAGNOSIS — R1032 Left lower quadrant pain: Secondary | ICD-10-CM | POA: Diagnosis not present

## 2015-05-23 DIAGNOSIS — R631 Polydipsia: Secondary | ICD-10-CM | POA: Insufficient documentation

## 2015-05-23 DIAGNOSIS — R358 Other polyuria: Secondary | ICD-10-CM | POA: Insufficient documentation

## 2015-05-23 DIAGNOSIS — E1165 Type 2 diabetes mellitus with hyperglycemia: Secondary | ICD-10-CM | POA: Insufficient documentation

## 2015-05-23 DIAGNOSIS — I10 Essential (primary) hypertension: Secondary | ICD-10-CM | POA: Insufficient documentation

## 2015-05-23 DIAGNOSIS — R109 Unspecified abdominal pain: Secondary | ICD-10-CM | POA: Diagnosis present

## 2015-05-23 DIAGNOSIS — R350 Frequency of micturition: Secondary | ICD-10-CM | POA: Insufficient documentation

## 2015-05-23 LAB — COMPREHENSIVE METABOLIC PANEL
ALT: 27 U/L (ref 17–63)
ANION GAP: 13 (ref 5–15)
AST: 31 U/L (ref 15–41)
Albumin: 3.5 g/dL (ref 3.5–5.0)
Alkaline Phosphatase: 118 U/L (ref 38–126)
BUN: 20 mg/dL (ref 6–20)
CHLORIDE: 98 mmol/L — AB (ref 101–111)
CO2: 22 mmol/L (ref 22–32)
Calcium: 8.9 mg/dL (ref 8.9–10.3)
Creatinine, Ser: 1.38 mg/dL — ABNORMAL HIGH (ref 0.61–1.24)
GFR, EST NON AFRICAN AMERICAN: 56 mL/min — AB (ref >60)
Glucose, Bld: 595 mg/dL (ref 65–99)
POTASSIUM: 4.4 mmol/L (ref 3.5–5.1)
Sodium: 133 mmol/L — ABNORMAL LOW (ref 135–145)
TOTAL PROTEIN: 7 g/dL (ref 6.5–8.1)
Total Bilirubin: 0.4 mg/dL (ref 0.3–1.2)

## 2015-05-23 LAB — CBC WITH DIFFERENTIAL/PLATELET
BASOS ABS: 0.1 10*3/uL (ref 0–0.1)
BASOS PCT: 1 %
Eosinophils Absolute: 0.2 10*3/uL (ref 0–0.7)
Eosinophils Relative: 3 %
HCT: 38 % — ABNORMAL LOW (ref 40.0–52.0)
Hemoglobin: 13 g/dL (ref 13.0–18.0)
Lymphocytes Relative: 33 %
Lymphs Abs: 2.4 10*3/uL (ref 1.0–3.6)
MCH: 32.5 pg (ref 26.0–34.0)
MCHC: 34.3 g/dL (ref 32.0–36.0)
MCV: 94.7 fL (ref 80.0–100.0)
Monocytes Absolute: 0.6 10*3/uL (ref 0.2–1.0)
Monocytes Relative: 8 %
NEUTROS ABS: 4.1 10*3/uL (ref 1.4–6.5)
Neutrophils Relative %: 55 %
PLATELETS: 232 10*3/uL (ref 150–440)
RBC: 4.01 MIL/uL — AB (ref 4.40–5.90)
RDW: 13.7 % (ref 11.5–14.5)
WBC: 7.4 10*3/uL (ref 3.8–10.6)

## 2015-05-23 LAB — URINALYSIS COMPLETE WITH MICROSCOPIC (ARMC ONLY)
BILIRUBIN URINE: NEGATIVE
Bacteria, UA: NONE SEEN
Glucose, UA: >500 mg/dL — AB
Hgb urine dipstick: NEGATIVE
KETONES UR: NEGATIVE mg/dL
LEUKOCYTES UA: NEGATIVE
Nitrite: NEGATIVE
PH: 5 (ref 5.0–8.0)
Protein, ur: 100 mg/dL — AB
SPECIFIC GRAVITY, URINE: 1.018 (ref 1.005–1.030)
Squamous Epithelial / LPF: NONE SEEN

## 2015-05-23 LAB — GLUCOSE, CAPILLARY
GLUCOSE-CAPILLARY: 386 mg/dL — AB (ref 65–99)
Glucose-Capillary: 574 mg/dL (ref 65–99)

## 2015-05-23 LAB — LIPASE, BLOOD: LIPASE: 33 U/L (ref 11–51)

## 2015-05-23 MED ORDER — OXYCODONE-ACETAMINOPHEN 5-325 MG PO TABS: 1.0000 | ORAL_TABLET | Freq: Four times a day (QID) | ORAL | Status: DC | PRN

## 2015-05-23 MED ORDER — SODIUM CHLORIDE 0.9 % IV BOLUS (SEPSIS)
2000.0000 mL | Freq: Once | INTRAVENOUS | Status: AC
  Administered 2015-05-23: 2000 mL via INTRAVENOUS

## 2015-05-23 MED ORDER — HYDROMORPHONE HCL 1 MG/ML IJ SOLN
1.0000 mg | Freq: Once | INTRAMUSCULAR | Status: AC
  Administered 2015-05-23: 1 mg via INTRAVENOUS
  Filled 2015-05-23: qty 1

## 2015-05-23 MED ORDER — ONDANSETRON HCL 4 MG/2ML IJ SOLN
4.0000 mg | Freq: Once | INTRAMUSCULAR | Status: AC
  Administered 2015-05-23: 4 mg via INTRAVENOUS
  Filled 2015-05-23: qty 2

## 2015-05-23 NOTE — ED Provider Notes (Signed)
-----------------------------------------   4:33 PM on 05/23/2015 -----------------------------------------   Blood pressure 134/95, pulse 80, temperature 97.6 F (36.4 C), temperature source Oral, resp. rate 14, height 5\' 7"  (1.702 m), weight 228 lb (103.42 kg), SpO2 97 %.  Assuming care from Dr. Joni Fears.  In short, Danny Cook is a 54 y.o. male with a chief complaint of Hyperglycemia and Back Pain .  Refer to the original H&P for additional details.  The current plan of care is to follow results of the patient's abdominal CT and recheck his blood sugar. His blood sugars dropped to 386 just with IV fluids.  TECHNIQUE: Multidetector CT imaging of the abdomen and pelvis was performed following the standard protocol without oral or intravenous contrast material administration.  COMPARISON: March 25, 2013  FINDINGS: Lower chest: There is slight bibasilar lung atelectasis.  Hepatobiliary: Liver is prominent, measuring 20.6 cm in length. No focal liver lesions are identified on this noncontrast enhanced study. Gallbladder is somewhat contracted without appreciable wall thickening. There is no biliary duct dilatation.  Pancreas: No pancreatic mass or inflammation.  Spleen: No splenic lesions are identified.  Adrenals/Urinary Tract: There is a stable left adrenal adenoma measuring 3.7 x 3.7 cm. Right adrenal appears normal. There is a calculus in the posterior mid right kidney measuring 1.1 x 0.9 cm. No other calculi are identified in either kidney. There is no renal mass or hydronephrosis on either side. There is no ureteral calculus on either side. The urinary bladder is midline with normal wall thickness.  Stomach/Bowel: There is no bowel wall or mesenteric thickening. No bowel obstruction. No free air or portal venous air.  Vascular/Lymphatic: There is no demonstrable abdominal aortic aneurysm. Major mesenteric vessels appear grossly normal on this noncontrast  enhanced study. There is no demonstrable adenopathy in the abdomen or pelvis.  Reproductive: There are several small prostatic calculi. The prostate does not appear enlarged. There is no pelvic mass or pelvic fluid collection.  Other: The appendix appears normal. No ascites or abscess is appreciable in the abdomen or pelvis.  Musculoskeletal: There is degenerative change in the lumbar spine. There are no blastic or lytic bone lesions. No intramuscular or abdominal wall lesions are identified.  IMPRESSION: Nonobstructing calculus mid right kidney measuring 1.1 x 0.9 cm. No ureteral calculi on either side. No hydronephrosis on either side. Several small prostatic calculi noted.  Prominent liver without focal lesion.  Stable left adrenal adenoma.  No bowel obstruction. No abscess. Appendix appears normal.  Patient otherwise has no physical complaints and denied any chest or abdominal pain at this time. Patient was advised to follow up with his primary physician for any adjustments of his metformin but I felt he could take an additional 500 mg tablet in the morning if his blood sugar was still elevated with the morning fingerstick. Patient's currently afebrile and expresses no physical concerns and I felt was stable for discharge. He was advised to watch his diet and again contact his primary physician or return here for any new concerns.  Danny Larsen, MD 05/23/15 [phone]

## 2015-05-23 NOTE — ED Notes (Signed)
Patient states that he has had increased thirst and increased urination x 2 weeks. Patient has hx/o Diabetes.

## 2015-05-23 NOTE — Discharge Instructions (Signed)
You were prescribed a medication that is potentially sedating. Do not drink alcohol, drive or participate in any other potentially dangerous activities while taking this medication as it may make you sleepy. Do not take this medication with any other sedating medications, either prescription or over-the-counter. If you were prescribed Percocet or Vicodin, do not take these with acetaminophen (Tylenol) as it is already contained within these medications. °  °Opioid pain medications (or "narcotics") can be habit forming.  Use it as little as possible to achieve adequate pain control.  Do not use or use it with extreme caution if you have a history of opiate abuse or dependence.  If you are on a pain contract with your primary care doctor or a pain specialist, be sure to let them know you were prescribed this medication today from the [facility].  This medication is intended for your use only - do not give any to anyone else and keep it in a secure place where nobody else, especially children and pets, have access to it.  It will also cause or worsen constipation, so you may want to consider taking an over-the-counter stool softener while you are taking this medication. ° °Abdominal Pain, Adult °Many things can cause abdominal pain. Usually, abdominal pain is not caused by a disease and will improve without treatment. It can often be observed and treated at home. Your health care provider will do a physical exam and possibly order blood tests and X-rays to help determine the seriousness of your pain. However, in many cases, more time must pass before a clear cause of the pain can be found. Before that point, your health care provider may not know if you need more testing or further treatment. °HOME CARE INSTRUCTIONS °Monitor your abdominal pain for any changes. The following actions may help to alleviate any discomfort you are experiencing: °· Only take over-the-counter or prescription  medicines as directed by your health care provider. °· Do not take laxatives unless directed to do so by your health care provider. °· Try a clear liquid diet (broth, tea, or water) as directed by your health care provider. Slowly move to a bland diet as tolerated. °SEEK MEDICAL CARE IF: °· You have unexplained abdominal pain. °· You have abdominal pain associated with nausea or diarrhea. °· You have pain when you urinate or have a bowel movement. °· You experience abdominal pain that wakes you in the night. °· You have abdominal pain that is worsened or improved by eating food. °· You have abdominal pain that is worsened with eating fatty foods. °· You have a fever. °SEEK IMMEDIATE MEDICAL CARE IF: °· Your pain does not go away within 2 hours. °· You keep throwing up (vomiting). °· Your pain is felt only in portions of the abdomen, such as the right side or the left lower portion of the abdomen. °· You pass bloody or black tarry stools. °MAKE SURE YOU: °· Understand these instructions. °· Will watch your condition. °· Will get help right away if you are not doing well or get worse. °  °This information is not intended to replace advice given to you by your health care provider. Make sure you discuss any questions you have with your health care provider. °  °Document Released: 03/26/2005 Document Revised: 03/07/2015 Document Reviewed: 02/23/2013 °[employer] Interactive Patient Education ©2016 [employer] ° °

## 2015-05-23 NOTE — ED Provider Notes (Signed)
[facility] Emergency Department Provider Note  ____________________________________________  Time seen: 12:45 PM  I have reviewed the triage vital signs and the nursing notes.   HISTORY  Chief Complaint Hyperglycemia and Back Pain    HPI Danny Cook. is a 54 y.o. male who complains of left flank pain since this morning. Also polyuria and polydipsia for 2 weeks. He has diabetes and takes metformin and he has been compliant. This morning his blood sugar read about 300, but when EMS picked him up it was over 600. Dysuria frequency urgency hematuria, shortness of breath chest pain fever chills or syncope.     Past Medical History  Diagnosis Date  . HIV (human immunodeficiency virus infection) ([place])   . Diabetes mellitus without complication ([place])   . Hypertension   . Depressed   . Schizophrenia, paranoid ([place])   . High cholesterol      There are no active problems to display for this patient.    Past Surgical History  Procedure Laterality Date  . Dg teeth full       No current outpatient prescriptions on file.   Allergies Viread   No family history on file.  Social History Social History  Substance Use Topics  . Smoking status: Never Smoker   . Smokeless tobacco: Never Used  . Alcohol Use: No    Review of Systems  Constitutional:   No fever or chills. No weight changes Eyes:   No blurry vision or double vision.  ENT:   No sore throat. Cardiovascular:   No chest pain. Respiratory:   No dyspnea or cough. Gastrointestinal:   Positive as above for abdominal pain, without vomiting and diarrhea.  No BRBPR or melena. Genitourinary:   Negative for dysuria, urinary retention, bloody urine, or difficulty urinating. Positive urinary frequency Musculoskeletal:   Negative for back pain. No joint swelling or pain. Skin:   Negative for rash. Neurological:   Negative for headaches, focal weakness or numbness. Psychiatric:  No  anxiety or depression.   Endocrine:  No hot/cold intolerance, changes in energy, or sleep difficulty.  10-point ROS otherwise negative.  ____________________________________________   PHYSICAL EXAM:  VITAL SIGNS: ED Triage Vitals  Enc Vitals Group     BP 05/23/15 1300 102/77 mmHg     Pulse Rate 05/23/15 1251 101     Resp 05/23/15 1251 16     Temp 05/23/15 1251 97.6 F (36.4 C)     Temp Source 05/23/15 1251 Oral     SpO2 05/23/15 1251 96 %     Weight 05/23/15 1251 228 lb (103.42 kg)     Height 05/23/15 1251 5\' 7"  (1.702 m)     Head Cir --      Peak Flow --      Pain Score 05/23/15 1256 8     Pain Loc --      Pain Edu? --      Excl. in [place]? --      Constitutional:   Alert and oriented. Well appearing and in no distress. Eyes:   No scleral icterus. No conjunctival pallor. PERRL. EOMI ENT   Head:   Normocephalic and atraumatic.   Nose:   No congestion/rhinnorhea. No septal hematoma   Mouth/Throat:   MMM, no pharyngeal erythema. No peritonsillar mass. No uvula shift.   Neck:   No stridor. No SubQ emphysema. No meningismus. Hematological/Lymphatic/Immunilogical:   No cervical lymphadenopathy. Cardiovascular:   RRR. Normal and symmetric distal pulses are present in  all extremities. No murmurs, rubs, or gallops. Respiratory:   Normal respiratory effort without tachypnea nor retractions. Breath sounds are clear and equal bilaterally. No wheezes/rales/rhonchi. Gastrointestinal:   Soft with mild left lower quadrant tenderness. No distention. There is no CVA tenderness.  No rebound, rigidity, or guarding. Genitourinary:   deferred Musculoskeletal:   Nontender with normal range of motion in all extremities. No joint effusions.  No lower extremity tenderness.  No edema. Neurologic:   Normal speech and language.  CN 2-10 normal. Motor grossly intact. No pronator drift.  Normal gait. No gross focal neurologic deficits are appreciated.  Skin:    Skin is warm, dry and  intact. No rash noted.  No petechiae, purpura, or bullae. Psychiatric:   Mood and affect are normal. Speech and behavior are normal. Patient exhibits appropriate insight and judgment.  ____________________________________________    LABS (pertinent positives/negatives) (all labs ordered are listed, but only abnormal results are displayed) Labs Reviewed  COMPREHENSIVE METABOLIC PANEL - Abnormal; Notable for the following:    Sodium 133 (*)    Chloride 98 (*)    Glucose, Bld 595 (*)    Creatinine, Ser 1.38 (*)    GFR calc non Af Amer 56 (*)    All other components within normal limits  CBC WITH DIFFERENTIAL/PLATELET - Abnormal; Notable for the following:    RBC 4.01 (*)    HCT 38.0 (*)    All other components within normal limits  URINALYSIS COMPLETEWITH MICROSCOPIC ([facility] ONLY) - Abnormal; Notable for the following:    Color, Urine STRAW (*)    APPearance CLEAR (*)    Glucose, UA >500 (*)    Protein, ur 100 (*)    All other components within normal limits  GLUCOSE, CAPILLARY - Abnormal; Notable for the following:    Glucose-Capillary 574 (*)    All other components within normal limits  LIPASE, BLOOD   ____________________________________________   EKG  Interpreted by me Normal sinus rhythm rate of 99, normal axis intervals QRS and ST segments and T waves.  ____________________________________________    RADIOLOGY  CT abdomen and pelvis renal protocol pending  ____________________________________________   PROCEDURES   ____________________________________________   INITIAL IMPRESSION / ASSESSMENT AND PLAN / ED COURSE  Pertinent labs & imaging results that were available during my care of the patient were reviewed by me and considered in my medical decision making (see chart for details).  Patient presents with flank pain and polyuria with hyperglycemia. We'll evaluate with labs and urine for DKA. CT abdomen and pelvis to evaluate for kidney stone. Low  suspicion for obstruction or perforation, no evidence of appendicitis or cholecystitis, AAA.  Hemodynamically stable.  ----------------------------------------- 3:14 PM on 05/23/2015 -----------------------------------------  Labs unremarkable. Anticipate discharge home. CT report still pending. Care of the patient is signed out to oncoming physician Dr. Marcelene Butte pending results for disposition.     ____________________________________________   FINAL CLINICAL IMPRESSION(S) / ED DIAGNOSES  Final diagnoses:  LLQ abdominal pain      Carrie Mew, MD 05/23/15 1514

## 2015-05-23 NOTE — ED Notes (Signed)
Patient brought in by [facility] c/o Left sided back pain in kidney area and also high blood sugar. Per EMS their meter read "high".

## 2015-05-27 ENCOUNTER — Emergency Department
Admission: EM | Admit: 2015-05-27 | Discharge: 2015-05-27 | Disposition: A | Discharge status: Home / Self Care | Payer: Medicare Other | Attending: Emergency Medicine | Admitting: Emergency Medicine

## 2015-05-27 ENCOUNTER — Emergency Department: Payer: Medicare Other

## 2015-05-27 ENCOUNTER — Encounter: Payer: Self-pay | Admitting: Emergency Medicine

## 2015-05-27 DIAGNOSIS — Z7984 Long term (current) use of oral hypoglycemic drugs: Secondary | ICD-10-CM | POA: Diagnosis not present

## 2015-05-27 DIAGNOSIS — I10 Essential (primary) hypertension: Secondary | ICD-10-CM | POA: Insufficient documentation

## 2015-05-27 DIAGNOSIS — E119 Type 2 diabetes mellitus without complications: Secondary | ICD-10-CM | POA: Diagnosis not present

## 2015-05-27 DIAGNOSIS — M1612 Unilateral primary osteoarthritis, left hip: Secondary | ICD-10-CM

## 2015-05-27 DIAGNOSIS — Z79899 Other long term (current) drug therapy: Secondary | ICD-10-CM | POA: Diagnosis not present

## 2015-05-27 DIAGNOSIS — M25552 Pain in left hip: Secondary | ICD-10-CM

## 2015-05-27 DIAGNOSIS — Z7982 Long term (current) use of aspirin: Secondary | ICD-10-CM | POA: Diagnosis not present

## 2015-05-27 HISTORY — DX: Sleep apnea, unspecified: G47.30

## 2015-05-27 MED ORDER — KETOROLAC TROMETHAMINE 30 MG/ML IJ SOLN
60.0000 mg | Freq: Once | INTRAMUSCULAR | Status: AC
  Administered 2015-05-27: 60 mg via INTRAMUSCULAR
  Filled 2015-05-27: qty 2

## 2015-05-27 MED ORDER — OXYCODONE-ACETAMINOPHEN 5-325 MG PO TABS: 1.0000 | ORAL_TABLET | ORAL | Status: DC | PRN

## 2015-05-27 MED ORDER — IBUPROFEN 800 MG PO TABS: 800.0000 mg | ORAL_TABLET | Freq: Three times a day (TID) | ORAL | Status: DC | PRN

## 2015-05-27 MED ORDER — OXYCODONE-ACETAMINOPHEN 5-325 MG PO TABS
1.0000 | ORAL_TABLET | Freq: Once | ORAL | Status: AC
  Administered 2015-05-27: 1 via ORAL
  Filled 2015-05-27: qty 1

## 2015-05-27 NOTE — ED Notes (Signed)
Pt reports sharp, shooting pain from left hip down leg x 4months.  Pt denies hx sciatica.  Pt denies numbness/tingling.  Pt reports able to walk with difficulty.  PT NAD at this time, respirations equal and unlabored, skin warm and dry.

## 2015-05-27 NOTE — ED Provider Notes (Signed)
[facility] Emergency Department Provider Note  ____________________________________________  Time seen: Approximately 4:32 AM  I have reviewed the triage vital signs and the nursing notes.   HISTORY  Chief Complaint Hip Pain    HPI Danny Cook. is a 54 y.o. male who presents to the ED from home with a chief complaint of left hip pain. Patient reports onset of nontraumatic left hip pain 2 months. Complains of sharp, shooting pain stemming from left hip radiating down left leg. Pain is worse with movement and ambulation. Patient denies associated symptoms of leg weakness, numbness/tingling. Patient denies back pain, bowel or bladder incontinence. Denies recent fever, chills, chest pain, shortness of breath, abdominal pain, nausea, vomiting, diarrhea. Of note, upon chart review, it is noted that patient was seen on November 23 for hyperglycemia and back pain. Denies recent travel. Nothing makes his pain better. Movement makes his pain worse.   Past Medical History  Diagnosis Date  . HIV (human immunodeficiency virus infection) ([place])   . Diabetes mellitus without complication ([place])   . Hypertension   . Depressed   . Schizophrenia, paranoid ([place])   . High cholesterol   . Sleep apnea     There are no active problems to display for this patient.   Past Surgical History  Procedure Laterality Date  . Dg teeth full      Current Outpatient Rx  Name  Route  Sig  Dispense  Refill  . Abacavir-Dolutegravir-Lamivud (TRIUMEQ) 600-50-300 MG TABS   Oral   Take 1 tablet by mouth daily.         [name] aspirin 325 MG tablet   Oral   Take 325 mg by mouth daily.         . clonazePAM (KLONOPIN) 2 MG tablet   Oral   Take 2 mg by mouth 2 (two) times daily.         . divalproex (DEPAKOTE ER) 250 MG 24 hr tablet   Oral   Take 250 mg by mouth daily.         . divalproex (DEPAKOTE ER) 250 MG 24 hr tablet   Oral   Take 500 mg by mouth at bedtime.          . ferrous sulfate 325 (65 FE) MG tablet   Oral   Take 325 mg by mouth daily with breakfast.         . furosemide (LASIX) 20 MG tablet   Oral   Take 20 mg by mouth daily.         [name] gabapentin (NEURONTIN) 100 MG capsule   Oral   Take 100 mg by mouth 3 (three) times daily.         [name] HYDROcodone-homatropine (HYCODAN) 5-1.5 MG/5ML syrup   Oral   Take 5 mLs by mouth every 6 (six) hours as needed for cough.         . iloperidone (FANAPT) 4 MG TABS tablet   Oral   Take 4 mg by mouth 2 (two) times daily.         [name] lisinopril (PRINIVIL,ZESTRIL) 20 MG tablet   Oral   Take 20 mg by mouth daily.         . meclizine (ANTIVERT) 25 MG tablet   Oral   Take 25 mg by mouth 3 (three) times daily as needed for dizziness.         . metFORMIN (GLUCOPHAGE) 1000 MG tablet   Oral   Take 1,000  mg by mouth 2 (two) times daily with a meal.         . mirtazapine (REMERON) 30 MG tablet   Oral   Take 30 mg by mouth at bedtime.         . Multiple Vitamins-Minerals (MULTIVITAMIN WITH MINERALS) tablet   Oral   Take 1 tablet by mouth daily.         [name] omeprazole (PRILOSEC) 40 MG capsule   Oral   Take 40 mg by mouth 2 (two) times daily.         [name] oxyCODONE-acetaminophen (ROXICET) 5-325 MG tablet   Oral   Take 1 tablet by mouth every 6 (six) hours as needed for severe pain.   10 tablet   0   . promethazine (PHENERGAN) 6.25 MG/5ML syrup   Oral   Take 6.25 mg by mouth every 6 (six) hours as needed for nausea or vomiting.         . rosuvastatin (CRESTOR) 20 MG tablet   Oral   Take 20 mg by mouth daily.         . sucralfate (CARAFATE) 1 G tablet   Oral   Take 1 g by mouth 4 (four) times daily -  with meals and at bedtime.           Allergies Viread  History reviewed. No pertinent family history.  Social History Social History  Substance Use Topics  . Smoking status: Never Smoker   . Smokeless tobacco: Never Used  . Alcohol Use: No    Review of  Systems Constitutional: No fever/chills Eyes: No visual changes. ENT: No sore throat. Cardiovascular: Denies chest pain. Respiratory: Denies shortness of breath. Gastrointestinal: No abdominal pain.  No nausea, no vomiting.  No diarrhea.  No constipation. Genitourinary: Negative for dysuria. Musculoskeletal: Positive for left hip pain. Negative for back pain. Skin: Negative for rash. Neurological: Negative for headaches, focal weakness or numbness.  10-point ROS otherwise negative.  ____________________________________________   PHYSICAL EXAM:  VITAL SIGNS: ED Triage Vitals  Enc Vitals Group     BP 05/27/15 0210 129/82 mmHg     Pulse Rate 05/27/15 0210 109     Resp 05/27/15 0210 20     Temp 05/27/15 0210 97.5 F (36.4 C)     Temp Source 05/27/15 0210 Oral     SpO2 05/27/15 0210 94 %     Weight 05/27/15 0210 228 lb (103.42 kg)     Height 05/27/15 0210 5\' 7"  (1.702 m)     Head Cir --      Peak Flow --      Pain Score 05/27/15 0231 10     Pain Loc --      Pain Edu? --      Excl. in [place]? --     Constitutional: Alert and oriented. Well appearing and in mild acute distress. Eyes: Conjunctivae are normal. PERRL. EOMI. Head: Atraumatic. Nose: No congestion/rhinnorhea. Mouth/Throat: Mucous membranes are moist.  Oropharynx non-erythematous. Neck: No stridor.  No cervical spine tenderness to palpation. Cardiovascular: Normal rate, regular rhythm. Grossly normal heart sounds.  Good peripheral circulation. Respiratory: Normal respiratory effort.  No retractions. Lungs CTAB. Gastrointestinal: Soft and nontender. No distention. No abdominal bruits. No CVA tenderness. Musculoskeletal: Stable pelvis. Left lateral hip tender to palpation. Decreased range of motion secondary to pain. There is no external rotation or shortening. No lower extremity tenderness nor edema.  No joint effusions. There is no warmth, erythema or swelling around the left  hip joint to suggest septic  arthritis. Neurologic:  Normal speech and language. No gross focal neurologic deficits are appreciated.  Skin:  Skin is warm, dry and intact. No rash noted. Psychiatric: Mood and affect are normal. Speech and behavior are normal.  ____________________________________________   LABS (all labs ordered are listed, but only abnormal results are displayed)  Labs Reviewed - No data to display ____________________________________________  EKG  None ____________________________________________  RADIOLOGY  Left hip x-ray (viewed by me, interpreted per Dr. [name]): 1. No acute fracture or dislocation. 2. Mild degenerative osteoarthrosis about the hips bilaterally.  ____________________________________________   PROCEDURES  Procedure(s) performed: None  Critical Care performed: No  ____________________________________________   INITIAL IMPRESSION / ASSESSMENT AND PLAN / ED COURSE  Pertinent labs & imaging results that were available during my care of the patient were reviewed by me and considered in my medical decision making (see chart for details).  54 year old male who presents with a 2 month history of left hip pain which is nontraumatic. This is patient's first evaluation for his left hip pain. Will obtain x-rays, administer analgesia and reassess.  ----------------------------------------- 6:24 AM on 05/27/2015 -----------------------------------------  Pain improved. Updated patient of imaging results. Plan for analgesia, walker and orthopedics follow-up. Strict return precautions given. Patient verbalizes understanding and agrees with plan of care. ____________________________________________   FINAL CLINICAL IMPRESSION(S) / ED DIAGNOSES  Final diagnoses:  Left hip pain  Osteoarthritis of left hip, unspecified osteoarthritis type      Paulette Blanch, MD 05/27/15 [id number]

## 2015-05-27 NOTE — ED Notes (Signed)
Discussed discharge instructions, prescriptions, and follow-up care with patient. No questions or concerns at this time. Pt stable at discharge. Pt to lobby to wait for ride.

## 2015-05-27 NOTE — Discharge Instructions (Signed)
1. Take pain medicines as needed (Motrin/Percocet #15). 2. Use walker for balance and ambulation. 3. Return to the ER for worsening symptoms, leg weakness, numbness/tingling or other concerns.  Hip Pain Your hip is the joint between your upper legs and your lower pelvis. The bones, cartilage, tendons, and muscles of your hip joint perform a lot of work each day supporting your body weight and allowing you to move around. Hip pain can range from a minor ache to severe pain in one or both of your hips. Pain may be felt on the inside of the hip joint near the groin, or the outside near the buttocks and upper thigh. You may have swelling or stiffness as well.  HOME CARE INSTRUCTIONS   Take medicines only as directed by your health care provider.  Apply ice to the injured area:  Put ice in a plastic bag.  Place a towel between your skin and the bag.  Leave the ice on for 15-20 minutes at a time, 3-4 times a day.  Keep your leg raised (elevated) when possible to lessen swelling.  Avoid activities that cause pain.  Follow specific exercises as directed by your health care provider.  Sleep with a pillow between your legs on your most comfortable side.  Record how often you have hip pain, the location of the pain, and what it feels like. SEEK MEDICAL CARE IF:   You are unable to put weight on your leg.  Your hip is red or swollen or very tender to touch.  Your pain or swelling continues or worsens after 1 week.  You have increasing difficulty walking.  You have a fever. SEEK IMMEDIATE MEDICAL CARE IF:   You have fallen.  You have a sudden increase in pain and swelling in your hip. MAKE SURE YOU:   Understand these instructions.  Will watch your condition.  Will get help right away if you are not doing well or get worse.   This information is not intended to replace advice given to you by your health care provider. Make sure you discuss any questions you have with your  health care provider.   Document Released: 12/04/2009 Document Revised: 07/07/2014 Document Reviewed: 02/10/2013 Elsevier Interactive Patient Education 2016 Elsevier Inc.  Osteoarthritis Osteoarthritis is a disease that causes soreness and inflammation of a joint. It occurs when the cartilage at the affected joint wears down. Cartilage acts as a cushion, covering the ends of bones where they meet to form a joint. Osteoarthritis is the most common form of arthritis. It often occurs in older people. The joints affected most often by this condition include those in the:  Ends of the fingers.  Thumbs.  Neck.  Lower back.  Knees.  Hips. CAUSES  Over time, the cartilage that covers the ends of bones begins to wear away. This causes bone to rub on bone, producing pain and stiffness in the affected joints.  RISK FACTORS Certain factors can increase your chances of having osteoarthritis, including:  Older age.  Excessive body weight.  Overuse of joints.  Previous joint injury. SIGNS AND SYMPTOMS   Pain, swelling, and stiffness in the joint.  Over time, the joint may lose its normal shape.  Small deposits of bone (osteophytes) may grow on the edges of the joint.  Bits of bone or cartilage can break off and float inside the joint space. This may cause more pain and damage. DIAGNOSIS  Your health care provider will do a physical exam and ask about  your symptoms. Various tests may be ordered, such as:  X-rays of the affected joint.  Blood tests to rule out other types of arthritis. Additional tests may be used to diagnose your condition. TREATMENT  Goals of treatment are to control pain and improve joint function. Treatment plans may include:  A prescribed exercise program that allows for rest and joint relief.  A weight control plan.  Pain relief techniques, such as:  Properly applied heat and cold.  Electric pulses delivered to nerve endings under the skin  (transcutaneous electrical nerve stimulation [TENS]).  Massage.  Certain nutritional supplements.  Medicines to control pain, such as:  Acetaminophen.  Nonsteroidal anti-inflammatory drugs (NSAIDs), such as naproxen.  Narcotic or central-acting agents, such as tramadol.  Corticosteroids. These can be given orally or as an injection.  Surgery to reposition the bones and relieve pain (osteotomy) or to remove loose pieces of bone and cartilage. Joint replacement may be needed in advanced states of osteoarthritis. HOME CARE INSTRUCTIONS   Take medicines only as directed by your health care provider.  Maintain a healthy weight. Follow your health care provider's instructions for weight control. This may include dietary instructions.  Exercise as directed. Your health care provider can recommend specific types of exercise. These may include:  Strengthening exercises. These are done to strengthen the muscles that support joints affected by arthritis. They can be performed with weights or with exercise bands to add resistance.  Aerobic activities. These are exercises, such as brisk walking or low-impact aerobics, that get your heart pumping.  Range-of-motion activities. These keep your joints limber.  Balance and agility exercises. These help you maintain daily living skills.  Rest your affected joints as directed by your health care provider.  Keep all follow-up visits as directed by your health care provider. SEEK MEDICAL CARE IF:   Your skin turns red.  You develop a rash in addition to your joint pain.  You have worsening joint pain.  You have a fever along with joint or muscle aches. SEEK IMMEDIATE MEDICAL CARE IF:  You have a significant loss of weight or appetite.  You have night sweats. [place] of Arthritis and Musculoskeletal and Skin Diseases: www.niams.SouthExposed.es  [employer] on Aging: http://kim-miller.com/  American College  of Rheumatology: www.rheumatology.org   This information is not intended to replace advice given to you by your health care provider. Make sure you discuss any questions you have with your health care provider.   Document Released: 06/16/2005 Document Revised: 07/07/2014 Document Reviewed: 02/21/2013 Elsevier Interactive Patient Education [employer].

## 2015-05-27 NOTE — ED Notes (Signed)
Pt complains of pain in left hip radiating down leg for 2 months that is getting worse.

## 2015-07-23 ENCOUNTER — Other Ambulatory Visit: Payer: Self-pay | Admitting: Physical Medicine and Rehabilitation

## 2015-07-23 ENCOUNTER — Other Ambulatory Visit: Payer: Self-pay | Admitting: Gastroenterology

## 2015-07-23 DIAGNOSIS — R1310 Dysphagia, unspecified: Secondary | ICD-10-CM

## 2015-07-23 DIAGNOSIS — M5416 Radiculopathy, lumbar region: Secondary | ICD-10-CM

## 2015-08-02 ENCOUNTER — Ambulatory Visit: Payer: Medicare Other

## 2015-08-07 ENCOUNTER — Ambulatory Visit: Payer: Medicare Other

## 2015-08-10 ENCOUNTER — Other Ambulatory Visit: Payer: Self-pay | Admitting: Gastroenterology

## 2015-08-10 ENCOUNTER — Ambulatory Visit: Admission: RE | Admit: 2015-08-10 | Payer: Medicare Other | Source: Ambulatory Visit

## 2015-08-10 ENCOUNTER — Ambulatory Visit
Admission: RE | Admit: 2015-08-10 | Discharge: 2015-08-10 | Disposition: A | Discharge status: Home / Self Care | Payer: Medicare Other | Source: Ambulatory Visit | Attending: Physical Medicine and Rehabilitation | Admitting: Physical Medicine and Rehabilitation

## 2015-08-10 ENCOUNTER — Ambulatory Visit
Admission: RE | Admit: 2015-08-10 | Discharge: 2015-08-10 | Disposition: A | Discharge status: Home / Self Care | Payer: Medicare Other | Source: Ambulatory Visit | Attending: Gastroenterology | Admitting: Gastroenterology

## 2015-08-10 DIAGNOSIS — M47816 Spondylosis without myelopathy or radiculopathy, lumbar region: Secondary | ICD-10-CM | POA: Diagnosis not present

## 2015-08-10 DIAGNOSIS — M2578 Osteophyte, vertebrae: Secondary | ICD-10-CM | POA: Diagnosis not present

## 2015-08-10 DIAGNOSIS — M4806 Spinal stenosis, lumbar region: Secondary | ICD-10-CM | POA: Insufficient documentation

## 2015-08-10 DIAGNOSIS — M4316 Spondylolisthesis, lumbar region: Secondary | ICD-10-CM | POA: Insufficient documentation

## 2015-08-10 DIAGNOSIS — R1310 Dysphagia, unspecified: Secondary | ICD-10-CM | POA: Diagnosis present

## 2015-08-10 DIAGNOSIS — M5136 Other intervertebral disc degeneration, lumbar region: Secondary | ICD-10-CM | POA: Diagnosis not present

## 2015-08-10 DIAGNOSIS — K228 Other specified diseases of esophagus: Secondary | ICD-10-CM | POA: Diagnosis not present

## 2015-08-10 DIAGNOSIS — M5416 Radiculopathy, lumbar region: Secondary | ICD-10-CM

## 2015-08-10 DIAGNOSIS — M545 Low back pain: Secondary | ICD-10-CM | POA: Diagnosis present

## 2015-08-31 ENCOUNTER — Encounter: Payer: Self-pay | Admitting: *Deleted

## 2015-09-03 ENCOUNTER — Encounter: Payer: Self-pay | Admitting: *Deleted

## 2015-09-03 ENCOUNTER — Ambulatory Visit: Payer: Medicare Other | Admitting: Anesthesiology

## 2015-09-03 ENCOUNTER — Ambulatory Visit
Admission: RE | Admit: 2015-09-03 | Discharge: 2015-09-03 | Disposition: A | Discharge status: Home / Self Care | Payer: Medicare Other | Source: Ambulatory Visit | Attending: Gastroenterology | Admitting: Gastroenterology

## 2015-09-03 ENCOUNTER — Encounter
Admission: RE | Disposition: A | Discharge status: Home / Self Care | Payer: Self-pay | Source: Ambulatory Visit | Attending: Gastroenterology

## 2015-09-03 DIAGNOSIS — G4730 Sleep apnea, unspecified: Secondary | ICD-10-CM | POA: Diagnosis not present

## 2015-09-03 DIAGNOSIS — E119 Type 2 diabetes mellitus without complications: Secondary | ICD-10-CM | POA: Insufficient documentation

## 2015-09-03 DIAGNOSIS — E7800 Pure hypercholesterolemia, unspecified: Secondary | ICD-10-CM | POA: Diagnosis not present

## 2015-09-03 DIAGNOSIS — Z7984 Long term (current) use of oral hypoglycemic drugs: Secondary | ICD-10-CM | POA: Insufficient documentation

## 2015-09-03 DIAGNOSIS — F419 Anxiety disorder, unspecified: Secondary | ICD-10-CM | POA: Insufficient documentation

## 2015-09-03 DIAGNOSIS — F200 Paranoid schizophrenia: Secondary | ICD-10-CM | POA: Insufficient documentation

## 2015-09-03 DIAGNOSIS — Z79899 Other long term (current) drug therapy: Secondary | ICD-10-CM | POA: Diagnosis not present

## 2015-09-03 DIAGNOSIS — K625 Hemorrhage of anus and rectum: Secondary | ICD-10-CM | POA: Insufficient documentation

## 2015-09-03 DIAGNOSIS — I10 Essential (primary) hypertension: Secondary | ICD-10-CM | POA: Insufficient documentation

## 2015-09-03 DIAGNOSIS — K5730 Diverticulosis of large intestine without perforation or abscess without bleeding: Secondary | ICD-10-CM | POA: Insufficient documentation

## 2015-09-03 DIAGNOSIS — Z888 Allergy status to other drugs, medicaments and biological substances status: Secondary | ICD-10-CM | POA: Insufficient documentation

## 2015-09-03 DIAGNOSIS — Z7982 Long term (current) use of aspirin: Secondary | ICD-10-CM | POA: Insufficient documentation

## 2015-09-03 DIAGNOSIS — Z21 Asymptomatic human immunodeficiency virus [HIV] infection status: Secondary | ICD-10-CM | POA: Diagnosis not present

## 2015-09-03 DIAGNOSIS — K529 Noninfective gastroenteritis and colitis, unspecified: Secondary | ICD-10-CM | POA: Diagnosis not present

## 2015-09-03 HISTORY — DX: Adverse effect of unspecified anesthetic, initial encounter: T41.45XA

## 2015-09-03 HISTORY — DX: Anxiety disorder, unspecified: F41.9

## 2015-09-03 HISTORY — PX: COLONOSCOPY WITH PROPOFOL: SHX5780

## 2015-09-03 LAB — CBC
HCT: 37.7 % — ABNORMAL LOW (ref 40.0–52.0)
Hemoglobin: 13 g/dL (ref 13.0–18.0)
MCH: 31.4 pg (ref 26.0–34.0)
MCHC: 34.6 g/dL (ref 32.0–36.0)
MCV: 90.6 fL (ref 80.0–100.0)
PLATELETS: 174 10*3/uL (ref 150–440)
RBC: 4.16 MIL/uL — AB (ref 4.40–5.90)
RDW: 13.7 % (ref 11.5–14.5)
WBC: 5.5 10*3/uL (ref 3.8–10.6)

## 2015-09-03 LAB — GLUCOSE, CAPILLARY: GLUCOSE-CAPILLARY: 90 mg/dL (ref 65–99)

## 2015-09-03 SURGERY — COLONOSCOPY WITH PROPOFOL
Anesthesia: General

## 2015-09-03 MED ORDER — SODIUM CHLORIDE 0.9 % IV SOLN
INTRAVENOUS | Status: DC
  Administered 2015-09-03: 1000 mL via INTRAVENOUS

## 2015-09-03 MED ORDER — SODIUM CHLORIDE 0.9 % IV SOLN: INTRAVENOUS | Status: DC

## 2015-09-03 MED ORDER — PROPOFOL 10 MG/ML IV BOLUS
INTRAVENOUS | Status: DC | PRN
  Administered 2015-09-03: 100 mg via INTRAVENOUS

## 2015-09-03 MED ORDER — LIDOCAINE HCL (PF) 1 % IJ SOLN
INTRAMUSCULAR | Status: AC
  Administered 2015-09-03: 0.03 mL via INTRADERMAL
  Filled 2015-09-03: qty 2

## 2015-09-03 MED ORDER — MIDAZOLAM HCL 5 MG/5ML IJ SOLN
INTRAMUSCULAR | Status: DC | PRN
  Administered 2015-09-03: 1 mg via INTRAVENOUS

## 2015-09-03 MED ORDER — LIDOCAINE HCL (PF) 1 % IJ SOLN
2.0000 mL | Freq: Once | INTRAMUSCULAR | Status: AC
  Administered 2015-09-03: 0.03 mL via INTRADERMAL

## 2015-09-03 MED ORDER — PROPOFOL 500 MG/50ML IV EMUL
INTRAVENOUS | Status: DC | PRN
  Administered 2015-09-03: 150 ug/kg/min via INTRAVENOUS

## 2015-09-03 MED ORDER — FENTANYL CITRATE (PF) 100 MCG/2ML IJ SOLN
INTRAMUSCULAR | Status: DC | PRN
  Administered 2015-09-03: 50 ug via INTRAVENOUS

## 2015-09-03 MED ORDER — LIDOCAINE HCL (CARDIAC) 20 MG/ML IV SOLN
INTRAVENOUS | Status: DC | PRN
  Administered 2015-09-03: 30 mg via INTRAVENOUS

## 2015-09-03 NOTE — Transfer of Care (Signed)
Immediate Anesthesia Transfer of Care Note  Patient: Danny Cook.  Procedure(s) Performed: Procedure(s): COLONOSCOPY WITH PROPOFOL (N/A)  Patient Location: PACU and Short Stay  Anesthesia Type:General  Level of Consciousness: awake, oriented and patient cooperative  Airway & Oxygen Therapy: Patient Spontanous Breathing and Patient connected to nasal cannula oxygen  Post-op Assessment: Report given to RN and Post -op Vital signs reviewed and stable  Post vital signs: Reviewed and stable  Last Vitals:  Filed Vitals:   09/03/15 0809  BP: 145/88  Pulse: 68  Temp: 36.4 C  Resp: 20    Complications: No apparent anesthesia complications

## 2015-09-03 NOTE — H&P (Signed)
Outpatient short stay form Pre-procedure 09/03/2015 9:41 AM Lollie Sails MD  Primary Physician: Dr. Adrian Prows, Dr.Vishwanath Hande  Reason for visit:  Colonoscopy  History of present illness:  Patient is a 55 year old male presenting today for colonoscopy. He has been seen some occasional blood in his stool. He had a colonoscopy in 2011 with a hyperplastic polyp and some hemorrhoids. He tolerated his prep well. He does take a 325 mg aspirin which she is held for several days. He takes no other blood thinning products.    Current facility-administered medications:  .  0.9 %  sodium chloride infusion, , Intravenous, Continuous, Lollie Sails, MD, Last Rate: 20 mL/hr at 09/03/15 0912, 1,000 mL at 09/03/15 0912 .  0.9 %  sodium chloride infusion, , Intravenous, Continuous, Lollie Sails, MD  Prescriptions prior to admission  Medication Sig Dispense Refill Last Dose  . Abacavir-Dolutegravir-Lamivud (TRIUMEQ) 600-50-300 MG TABS Take 1 tablet by mouth daily.   09/02/2015 at Unknown time  . aspirin 325 MG tablet Take 325 mg by mouth daily.   Past Week at Unknown time  . clonazePAM (KLONOPIN) 2 MG tablet Take 2 mg by mouth 2 (two) times daily.   Past Week at Unknown time  . divalproex (DEPAKOTE ER) 250 MG 24 hr tablet Take 250 mg by mouth daily.   Past Week at Unknown time  . divalproex (DEPAKOTE ER) 250 MG 24 hr tablet Take 500 mg by mouth at bedtime.   Past Week at Unknown time  . ferrous sulfate 325 (65 FE) MG tablet Take 325 mg by mouth daily with breakfast.   Past Week at Unknown time  . furosemide (LASIX) 20 MG tablet Take 20 mg by mouth daily.   Past Week at Unknown time  . gabapentin (NEURONTIN) 100 MG capsule Take 100 mg by mouth 3 (three) times daily.   Past Week at Unknown time  . HYDROcodone-homatropine (HYCODAN) 5-1.5 MG/5ML syrup Take 5 mLs by mouth every 6 (six) hours as needed for cough.   Past Week at Unknown time  . ibuprofen (ADVIL,MOTRIN) 800 MG tablet Take 1  tablet (800 mg total) by mouth every 8 (eight) hours as needed for moderate pain. 15 tablet 0 Past Week at Unknown time  . iloperidone (FANAPT) 4 MG TABS tablet Take 4 mg by mouth 2 (two) times daily.   Past Week at Unknown time  . lisinopril (PRINIVIL,ZESTRIL) 20 MG tablet Take 20 mg by mouth daily.   Past Week at Unknown time  . meclizine (ANTIVERT) 25 MG tablet Take 25 mg by mouth 3 (three) times daily as needed for dizziness.   Past Week at Unknown time  . metFORMIN (GLUCOPHAGE) 1000 MG tablet Take 1,000 mg by mouth 2 (two) times daily with a meal.   Past Week at Unknown time  . mirtazapine (REMERON) 30 MG tablet Take 30 mg by mouth at bedtime.   Past Week at Unknown time  . Multiple Vitamins-Minerals (MULTIVITAMIN WITH MINERALS) tablet Take 1 tablet by mouth daily.   Past Week at Unknown time  . omeprazole (PRILOSEC) 40 MG capsule Take 40 mg by mouth 2 (two) times daily.   Past Week at Unknown time  . oxyCODONE-acetaminophen (ROXICET) 5-325 MG tablet Take 1 tablet by mouth every 4 (four) hours as needed for severe pain. 15 tablet 0 Past Week at Unknown time  . promethazine (PHENERGAN) 6.25 MG/5ML syrup Take 6.25 mg by mouth every 6 (six) hours as needed for nausea or vomiting.  Past Week at Unknown time  . rosuvastatin (CRESTOR) 20 MG tablet Take 20 mg by mouth daily.   Past Week at Unknown time  . sucralfate (CARAFATE) 1 G tablet Take 1 g by mouth 4 (four) times daily -  with meals and at bedtime.   Past Week at Unknown time     Allergies  Allergen Reactions  . Viread [Tenofovir Disoproxil Fumarate]     Damages kidneys     Past Medical History  Diagnosis Date  . HIV (human immunodeficiency virus infection) ([place])   . Diabetes mellitus without complication ([place])   . Hypertension   . Depressed   . Schizophrenia, paranoid ([place])   . High cholesterol   . Sleep apnea   . Complication of anesthesia   . Anxiety     Review of systems:      Physical Exam    Heart and lungs:  Regular rate and rhythm, lungs are bilaterally clear.    HEENT: Normocephalic atraumatic eyes are anicteric    Other:     Pertinant exam for procedure: Soft nontender nondistended bowel sounds positive normoactive.    Planned proceedures: Colonoscopy and indicated procedures. I have discussed the risks benefits and complications of procedures to include not limited to bleeding, infection, perforation and the risk of sedation and the patient wishes to proceed.    Lollie Sails, MD Gastroenterology 09/03/2015  9:41 AM

## 2015-09-03 NOTE — Op Note (Signed)
[facility] Gastroenterology Patient Name: Danny Cook Procedure Date: 09/03/2015 9:58 AM MRN: [id number] Account #: [id number] Date of Birth: [birth year] Admit Type: Outpatient Age: 55 Room: [facility] ENDO ROOM 3 Gender: Male Note Status: Finalized Procedure:            Colonoscopy Indications:          Rectal bleeding Providers:            Lollie Sails, MD Referring MD:         Tracie Harrier, MD (Referring MD) Medicines:            Monitored Anesthesia Care Complications:        No immediate complications. Procedure:            Pre-Anesthesia Assessment:                       - ASA Grade Assessment: III - A patient with severe                        systemic disease.                       After obtaining informed consent, the colonoscope was                        passed under direct vision. Throughout the procedure,                        the patient's blood pressure, pulse, and oxygen                        saturations were monitored continuously. The                        Colonoscope was introduced through the anus and                        advanced to the the cecum, identified by appendiceal                        orifice and ileocecal valve. The colonoscopy was                        performed without difficulty. The patient tolerated the                        procedure well. The quality of the bowel preparation                        was good. Findings:      A few small-mouthed diverticula were found in the sigmoid colon.      A diffuse area of mildly congested mucosa was found in the rectum.       Biopsies for histology were taken with a cold forceps from the rectum       for evaluation of microscopic colitis.      The retroflexed view of the distal rectum and anal verge was normal and       showed no anal or rectal abnormalities.      The exam was otherwise without abnormality.      The digital rectal exam was normal.  A few patchy  non-bleeding aphthae were found in the rectum. Stigmata of       recent bleeding were present. Impression:           - Diverticulosis in the sigmoid colon.                       - Congested mucosa in the rectum. Biopsied.                       - The distal rectum and anal verge are normal on                        retroflexion view.                       - The examination was otherwise normal. Recommendation:       - Discharge patient to home.                       - Await pathology results.                       - Return to GI clinic in 1 month. Procedure Code(s):    --- Professional ---                       [phone], Colonoscopy, flexible; with biopsy, single or                        multiple Diagnosis Code(s):    --- Professional ---                       K62.89, Other specified diseases of anus and rectum                       K62.5, Hemorrhage of anus and rectum                       K57.30, Diverticulosis of large intestine without                        perforation or abscess without bleeding CPT copyright 2016 [employer]. All rights reserved. The codes documented in this report are preliminary and upon coder review may  be revised to meet current compliance requirements. Lollie Sails, MD 09/03/2015 10:27:47 AM This report has been signed electronically. Number of Addenda: 0 Note Initiated On: 09/03/2015 9:58 AM Scope Withdrawal Time: 0 hours 8 minutes 3 seconds  Total Procedure Duration: 0 hours 16 minutes 46 seconds       [facility]

## 2015-09-03 NOTE — Anesthesia Preprocedure Evaluation (Signed)
Anesthesia Evaluation  Patient identified by MRN, date of birth, ID band Patient awake    Reviewed: Allergy & Precautions, NPO status , Patient's Chart, lab work & pertinent test results, reviewed documented beta blocker date and time   Airway Mallampati: II  TM Distance: >3 FB     Dental  (+) Chipped   Pulmonary sleep apnea ,           Cardiovascular hypertension, Pt. on medications      Neuro/Psych PSYCHIATRIC DISORDERS Anxiety Schizophrenia    GI/Hepatic   Endo/Other  diabetes, Type 2  Renal/GU      Musculoskeletal   Abdominal   Peds  Hematology   Anesthesia Other Findings   Reproductive/Obstetrics                             Anesthesia Physical Anesthesia Plan  ASA: II  Anesthesia Plan: General   Post-op Pain Management:    Induction: Intravenous  Airway Management Planned: Nasal Cannula  Additional Equipment:   Intra-op Plan:   Post-operative Plan:   Informed Consent: I have reviewed the patients History and Physical, chart, labs and discussed the procedure including the risks, benefits and alternatives for the proposed anesthesia with the patient or authorized representative who has indicated his/her understanding and acceptance.     Plan Discussed with: CRNA  Anesthesia Plan Comments:         Anesthesia Quick Evaluation

## 2015-09-03 NOTE — Transfer of Care (Signed)
Immediate Anesthesia Transfer of Care Note  Patient: Danny Cook.  Procedure(s) Performed: Procedure(s): COLONOSCOPY WITH PROPOFOL (N/A)  Patient Location: PACU and Short Stay  Anesthesia Type:General  Level of Consciousness: awake and oriented  Airway & Oxygen Therapy: Patient Spontanous Breathing and Patient connected to nasal cannula oxygen  Post-op Assessment: Report given to RN and Post -op Vital signs reviewed and stable  Post vital signs: Reviewed and stable  Last Vitals:  Filed Vitals:   09/03/15 0809 09/03/15 1032  BP: 145/88 132/84  Pulse: 68 64  Temp: 36.4 C 36.2 C  Resp: 20 8    Complications: No apparent anesthesia complications

## 2015-09-04 LAB — SURGICAL PATHOLOGY

## 2015-09-05 NOTE — Anesthesia Postprocedure Evaluation (Signed)
Anesthesia Post Note  Patient: Danny Cook.  Procedure(s) Performed: Procedure(s) (LRB): COLONOSCOPY WITH PROPOFOL (N/A)  Patient location during evaluation: Endoscopy Anesthesia Type: General Level of consciousness: awake and alert Pain management: pain level controlled Vital Signs Assessment: post-procedure vital signs reviewed and stable Respiratory status: spontaneous breathing, nonlabored ventilation, respiratory function stable and patient connected to nasal cannula oxygen Cardiovascular status: blood pressure returned to baseline and stable Postop Assessment: no signs of nausea or vomiting Anesthetic complications: no    Last Vitals:  Filed Vitals:   09/03/15 1050 09/03/15 1100  BP: 89/76 113/79  Pulse: 54 50  Temp:    Resp: 14 21    Last Pain: There were no vitals filed for this visit.               Synda Bagent S

## 2015-11-15 ENCOUNTER — Other Ambulatory Visit
Admission: RE | Admit: 2015-11-15 | Discharge: 2015-11-15 | Disposition: A | Discharge status: Home / Self Care | Payer: Medicare Other | Source: Ambulatory Visit | Attending: Gastroenterology | Admitting: Gastroenterology

## 2015-11-15 DIAGNOSIS — K625 Hemorrhage of anus and rectum: Secondary | ICD-10-CM | POA: Diagnosis present

## 2015-11-15 DIAGNOSIS — R1084 Generalized abdominal pain: Secondary | ICD-10-CM | POA: Diagnosis present

## 2015-11-22 ENCOUNTER — Encounter: Payer: Self-pay | Admitting: Gastroenterology

## 2015-11-23 LAB — MISCELLANEOUS TEST

## 2016-04-12 ENCOUNTER — Emergency Department: Payer: Medicare Other

## 2016-04-12 ENCOUNTER — Emergency Department
Admission: EM | Admit: 2016-04-12 | Discharge: 2016-04-12 | Disposition: A | Discharge status: Home / Self Care | Payer: Medicare Other | Attending: Emergency Medicine | Admitting: Emergency Medicine

## 2016-04-12 DIAGNOSIS — E860 Dehydration: Secondary | ICD-10-CM | POA: Diagnosis not present

## 2016-04-12 DIAGNOSIS — R42 Dizziness and giddiness: Secondary | ICD-10-CM | POA: Diagnosis present

## 2016-04-12 DIAGNOSIS — Z7984 Long term (current) use of oral hypoglycemic drugs: Secondary | ICD-10-CM | POA: Diagnosis not present

## 2016-04-12 DIAGNOSIS — E119 Type 2 diabetes mellitus without complications: Secondary | ICD-10-CM | POA: Insufficient documentation

## 2016-04-12 DIAGNOSIS — Z79899 Other long term (current) drug therapy: Secondary | ICD-10-CM | POA: Diagnosis not present

## 2016-04-12 DIAGNOSIS — I10 Essential (primary) hypertension: Secondary | ICD-10-CM | POA: Diagnosis not present

## 2016-04-12 DIAGNOSIS — Z7982 Long term (current) use of aspirin: Secondary | ICD-10-CM | POA: Insufficient documentation

## 2016-04-12 DIAGNOSIS — Z21 Asymptomatic human immunodeficiency virus [HIV] infection status: Secondary | ICD-10-CM | POA: Diagnosis not present

## 2016-04-12 LAB — CBC
HEMATOCRIT: 38.4 % — AB (ref 40.0–52.0)
Hemoglobin: 13.5 g/dL (ref 13.0–18.0)
MCH: 32.7 pg (ref 26.0–34.0)
MCHC: 35.2 g/dL (ref 32.0–36.0)
MCV: 92.8 fL (ref 80.0–100.0)
Platelets: 202 10*3/uL (ref 150–440)
RBC: 4.14 MIL/uL — AB (ref 4.40–5.90)
RDW: 13.7 % (ref 11.5–14.5)
WBC: 8.7 10*3/uL (ref 3.8–10.6)

## 2016-04-12 LAB — URINALYSIS COMPLETE WITH MICROSCOPIC (ARMC ONLY)
BACTERIA UA: NONE SEEN
BILIRUBIN URINE: NEGATIVE
Ketones, ur: NEGATIVE mg/dL
LEUKOCYTES UA: NEGATIVE
NITRITE: NEGATIVE
PH: 5 (ref 5.0–8.0)
Protein, ur: 100 mg/dL — AB
SPECIFIC GRAVITY, URINE: 1.021 (ref 1.005–1.030)
Squamous Epithelial / LPF: NONE SEEN

## 2016-04-12 LAB — GLUCOSE, CAPILLARY: Glucose-Capillary: 126 mg/dL — ABNORMAL HIGH (ref 65–99)

## 2016-04-12 LAB — BASIC METABOLIC PANEL
Anion gap: 9 (ref 5–15)
BUN: 25 mg/dL — AB (ref 6–20)
CO2: 26 mmol/L (ref 22–32)
Calcium: 9.1 mg/dL (ref 8.9–10.3)
Chloride: 105 mmol/L (ref 101–111)
Creatinine, Ser: 1.75 mg/dL — ABNORMAL HIGH (ref 0.61–1.24)
GFR, EST AFRICAN AMERICAN: 48 mL/min — AB (ref >60)
GFR, EST NON AFRICAN AMERICAN: 41 mL/min — AB (ref >60)
Glucose, Bld: 223 mg/dL — ABNORMAL HIGH (ref 65–99)
Potassium: 4.4 mmol/L (ref 3.5–5.1)
Sodium: 140 mmol/L (ref 135–145)

## 2016-04-12 MED ORDER — MECLIZINE HCL 25 MG PO TABS: 25.0000 mg | ORAL_TABLET | Freq: Three times a day (TID) | ORAL | 1 refills | Status: DC | PRN

## 2016-04-12 MED ORDER — ONDANSETRON 4 MG PO TBDP: 4.0000 mg | ORAL_TABLET | Freq: Three times a day (TID) | ORAL | 0 refills | Status: DC | PRN

## 2016-04-12 MED ORDER — MECLIZINE HCL 25 MG PO TABS
50.0000 mg | ORAL_TABLET | Freq: Once | ORAL | Status: AC
  Administered 2016-04-12: 50 mg via ORAL
  Filled 2016-04-12: qty 2

## 2016-04-12 NOTE — ED Notes (Signed)
E-signature not working. 

## 2016-04-12 NOTE — ED Notes (Signed)
Pt states is dizzy and lightheaded for 2 days. Pt assisted out of vehicle, is ambulatory without difficulty. Pt denies head trauma but states has had a headache today.

## 2016-04-12 NOTE — ED Triage Notes (Signed)
Patient reports being dizzy for the past 2 days, worse tonight and with the feeling of weakness.

## 2016-04-12 NOTE — ED Provider Notes (Signed)
[facility] Emergency Department Provider Note  ____________________________________________  Time seen: Approximately 9:44 PM  I have reviewed the triage vital signs and the nursing notes.   HISTORY  Chief Complaint Dizziness    HPI Danny Cook. is a 55 y.o. male who complains of dizziness described as room spinning for the past 2 days, off and on. Worse with changing of position. Worse with turning of his head. Associated with nausea but no vomiting. Also has a posterior headache. Feels like he is off balance when he walks, but not dropping anything. No lateralizing weakness paresthesias or changes in coordination. Denies any head trauma. No neck stiffness or fever or chills.  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 The headache is new but not thunderclap or sudden onset. Headache is mild in intensity. Patient is compliant with his medications.   Past Medical History:  Diagnosis Date  . Anxiety   . Complication of anesthesia   . Depressed   . Diabetes mellitus without complication ([place])   . High cholesterol   . HIV (human immunodeficiency virus infection) ([place])   . Hypertension   . Schizophrenia, paranoid ([place])   . Sleep apnea      There are no active problems to display for this patient.    Past Surgical History:  Procedure Laterality Date  . COLONOSCOPY WITH PROPOFOL N/A 09/03/2015   Procedure: COLONOSCOPY WITH PROPOFOL;  Surgeon: Lollie Sails, MD;  Location: [facility] ENDOSCOPY;  Service: Endoscopy;  Laterality: N/A;  . DG TEETH FULL       Prior to Admission medications   Medication Sig Start Date End Date Taking? Authorizing Provider  Abacavir-Dolutegravir-Lamivud (TRIUMEQ) 600-50-300 MG TABS Take 1 tablet by mouth daily.    Historical Provider, MD  aspirin 325 MG tablet Take 325 mg by mouth daily.    Historical Provider, MD  clonazePAM (KLONOPIN) 2 MG tablet Take 2 mg by mouth 2 (two)  times daily.    Historical Provider, MD  divalproex (DEPAKOTE ER) 250 MG 24 hr tablet Take 250 mg by mouth daily.    Historical Provider, MD  divalproex (DEPAKOTE ER) 250 MG 24 hr tablet Take 500 mg by mouth at bedtime.    Historical Provider, MD  ferrous sulfate 325 (65 FE) MG tablet Take 325 mg by mouth daily with breakfast.    Historical Provider, MD  furosemide (LASIX) 20 MG tablet Take 20 mg by mouth daily.    Historical Provider, MD  gabapentin (NEURONTIN) 100 MG capsule Take 100 mg by mouth 3 (three) times daily.    Historical Provider, MD  HYDROcodone-homatropine (HYCODAN) 5-1.5 MG/5ML syrup Take 5 mLs by mouth every 6 (six) hours as needed for cough.    Historical Provider, MD  ibuprofen (ADVIL,MOTRIN) 800 MG tablet Take 1 tablet (800 mg total) by mouth every 8 (eight) hours as needed for moderate pain. 05/27/15   Paulette Blanch, MD  iloperidone (FANAPT) 4 MG TABS tablet Take 4 mg by mouth 2 (two) times daily.    Historical Provider, MD  lisinopril (PRINIVIL,ZESTRIL) 20 MG tablet Take 20 mg by mouth daily.    Historical Provider, MD  meclizine (ANTIVERT) 25 MG tablet Take 1 tablet (25 mg total) by mouth 3 (three) times daily as needed for dizziness or nausea. 04/12/16   Carrie Mew, MD  metFORMIN (GLUCOPHAGE) 1000 MG tablet Take 1,000 mg by mouth 2 (two) times daily with a meal.  Historical Provider, MD  mirtazapine (REMERON) 30 MG tablet Take 30 mg by mouth at bedtime.    Historical Provider, MD  Multiple Vitamins-Minerals (MULTIVITAMIN WITH MINERALS) tablet Take 1 tablet by mouth daily.    Historical Provider, MD  omeprazole (PRILOSEC) 40 MG capsule Take 40 mg by mouth 2 (two) times daily.    Historical Provider, MD  ondansetron (ZOFRAN ODT) 4 MG disintegrating tablet Take 1 tablet (4 mg total) by mouth every 8 (eight) hours as needed for nausea or vomiting. 04/12/16   Carrie Mew, MD  oxyCODONE-acetaminophen (ROXICET) 5-325 MG tablet Take 1 tablet by mouth every 4 (four) hours as  needed for severe pain. 05/27/15   Paulette Blanch, MD  promethazine (PHENERGAN) 6.25 MG/5ML syrup Take 6.25 mg by mouth every 6 (six) hours as needed for nausea or vomiting.    Historical Provider, MD  rosuvastatin (CRESTOR) 20 MG tablet Take 20 mg by mouth daily.    Historical Provider, MD  sucralfate (CARAFATE) 1 G tablet Take 1 g by mouth 4 (four) times daily -  with meals and at bedtime.    Historical Provider, MD     Allergies Viread [tenofovir disoproxil]   No family history on file.  Social History Social History  Substance Use Topics  . Smoking status: Never Smoker  . Smokeless tobacco: Never Used  . Alcohol use No    Review of Systems  Constitutional:   No fever or chills.  ENT:   No sore throat. No rhinorrhea. Cardiovascular:   No chest pain. Respiratory:   No dyspnea or cough. Gastrointestinal:   Negative for abdominal pain, vomiting and diarrhea.  Genitourinary:   Negative for dysuria or difficulty urinating. Neurological:   Negative for headaches 10-point ROS otherwise negative.  ____________________________________________   PHYSICAL EXAM:  VITAL SIGNS: ED Triage Vitals  Enc Vitals Group     BP 04/12/16 2055 140/74     Pulse Rate 04/12/16 2055 85     Resp 04/12/16 2055 20     Temp 04/12/16 2055 97.9 F (36.6 C)     Temp Source 04/12/16 2055 Oral     SpO2 04/12/16 2055 98 %     Weight 04/12/16 2054 207 lb (93.9 kg)     Height 04/12/16 2054 5\' 7"  (1.702 m)     Head Circumference --      Peak Flow --      Pain Score --      Pain Loc --      Pain Edu? --      Excl. in [place]? --     Vital signs reviewed, nursing assessments reviewed.   Constitutional:   Alert and oriented. Well appearing and in no distress. Eyes:   No scleral icterus. No conjunctival pallor. PERRL. EOMI.  No nystagmus. ENT   Head:   Normocephalic and atraumatic.   Nose:   No congestion/rhinnorhea. No septal hematoma   Mouth/Throat:   MMM, no pharyngeal erythema. No  peritonsillar mass.    Neck:   No stridor. No SubQ emphysema. No meningismus. Hematological/Lymphatic/Immunilogical:   No cervical lymphadenopathy. Cardiovascular:   RRR. Symmetric bilateral radial and DP pulses.  No murmurs.  Respiratory:   Normal respiratory effort without tachypnea nor retractions. Breath sounds are clear and equal bilaterally. No wheezes/rales/rhonchi. Gastrointestinal:   Soft and nontender. Non distended. There is no CVA tenderness.  No rebound, rigidity, or guarding. Genitourinary:   deferred Musculoskeletal:   Nontender with normal range of motion in all extremities. No  joint effusions.  No lower extremity tenderness.  No edema. Neurologic:   Normal speech and language.  No pronator drift. Normal finger-nose-finger. Steady gait. CN 2-10 normal. Motor grossly intact. No gross focal neurologic deficits are appreciated.  Skin:    Skin is warm, dry and intact. No rash noted.  No petechiae, purpura, or bullae.  ____________________________________________    LABS (pertinent positives/negatives) (all labs ordered are listed, but only abnormal results are displayed) Labs Reviewed  BASIC METABOLIC PANEL - Abnormal; Notable for the following:       Result Value   Glucose, Bld 223 (*)    BUN 25 (*)    Creatinine, Ser 1.75 (*)    GFR calc non Af Amer 41 (*)    GFR calc Af Amer 48 (*)    All other components within normal limits  CBC - Abnormal; Notable for the following:    RBC 4.14 (*)    HCT 38.4 (*)    All other components within normal limits  URINALYSIS COMPLETEWITH MICROSCOPIC ([facility] ONLY) - Abnormal; Notable for the following:    Color, Urine YELLOW (*)    APPearance CLEAR (*)    Glucose, UA >500 (*)    Hgb urine dipstick 2+ (*)    Protein, ur 100 (*)    All other components within normal limits   ____________________________________________   EKG  Interpreted by me  Date: 04/12/2016  Rate: 86  Rhythm: normal sinus rhythm  QRS Axis: normal   Intervals: normal  ST/T Wave abnormalities: normal  Conduction Disutrbances: none  Narrative Interpretation: unremarkable      ____________________________________________    RADIOLOGY  CT head unremarkable  ____________________________________________   PROCEDURES Procedures  ____________________________________________   INITIAL IMPRESSION / ASSESSMENT AND PLAN / ED COURSE  Pertinent labs & imaging results that were available during my care of the patient were reviewed by me and considered in my medical decision making (see chart for details).  Patient well appearing no acute distress. We'll check CT head, give meclizine. Likely benign positional vertigo. Low suspicion for cerebellar stroke or brain stem stroke.     Clinical Course  Comment By Time  CT neg. F/u UA, plan for DC home.  Carrie Mew, MD 10/14 2232    ----------------------------------------- 10:51 PM on 04/12/2016 -----------------------------------------  Urinalysis unremarkable. Creatinine slightly elevated from baseline of 1.4, consistent with mild dehydration, likely due to hyperglycemia, worsening his underlying inner ear dysfunction and vertigo.Considering the patient's symptoms, medical history, and physical examination today, I have low suspicion for ischemic stroke, intracranial hemorrhage, meningitis, encephalitis, carotid or vertebral dissection, venous sinus thrombosis, MS, intracranial hypertension, glaucoma, CRAO, CRVO, or temporal arteritis. Discharge home, follow up with primary care in 2 days on Monday. ____________________________________________   FINAL CLINICAL IMPRESSION(S) / ED DIAGNOSES  Final diagnoses:  Dizziness  Vertigo  Mild dehydration       Portions of this note were generated with dragon dictation software. Dictation errors may occur despite best attempts at proofreading.    Carrie Mew, MD 04/12/16 2252

## 2016-05-15 ENCOUNTER — Other Ambulatory Visit: Payer: Self-pay | Admitting: Internal Medicine

## 2016-05-15 DIAGNOSIS — R519 Headache, unspecified: Secondary | ICD-10-CM

## 2016-05-15 DIAGNOSIS — R51 Headache: Principal | ICD-10-CM

## 2016-05-25 ENCOUNTER — Encounter: Payer: Self-pay | Admitting: Emergency Medicine

## 2016-05-25 ENCOUNTER — Emergency Department: Payer: Medicare Other

## 2016-05-25 ENCOUNTER — Emergency Department
Admission: EM | Admit: 2016-05-25 | Discharge: 2016-05-25 | Disposition: A | Discharge status: Home / Self Care | Payer: Medicare Other | Attending: Emergency Medicine | Admitting: Emergency Medicine

## 2016-05-25 DIAGNOSIS — R531 Weakness: Secondary | ICD-10-CM | POA: Insufficient documentation

## 2016-05-25 DIAGNOSIS — R9082 White matter disease, unspecified: Secondary | ICD-10-CM | POA: Diagnosis not present

## 2016-05-25 DIAGNOSIS — E119 Type 2 diabetes mellitus without complications: Secondary | ICD-10-CM | POA: Diagnosis not present

## 2016-05-25 DIAGNOSIS — Z7982 Long term (current) use of aspirin: Secondary | ICD-10-CM | POA: Insufficient documentation

## 2016-05-25 DIAGNOSIS — Z21 Asymptomatic human immunodeficiency virus [HIV] infection status: Secondary | ICD-10-CM | POA: Diagnosis not present

## 2016-05-25 DIAGNOSIS — R5383 Other fatigue: Secondary | ICD-10-CM | POA: Diagnosis present

## 2016-05-25 DIAGNOSIS — Z7984 Long term (current) use of oral hypoglycemic drugs: Secondary | ICD-10-CM | POA: Diagnosis not present

## 2016-05-25 DIAGNOSIS — I10 Essential (primary) hypertension: Secondary | ICD-10-CM | POA: Insufficient documentation

## 2016-05-25 LAB — URINALYSIS COMPLETE WITH MICROSCOPIC (ARMC ONLY)
Bilirubin Urine: NEGATIVE
GLUCOSE, UA: 150 mg/dL — AB
HGB URINE DIPSTICK: NEGATIVE
Ketones, ur: NEGATIVE mg/dL
NITRITE: NEGATIVE
PH: 5 (ref 5.0–8.0)
PROTEIN: 100 mg/dL — AB
SPECIFIC GRAVITY, URINE: 1.021 (ref 1.005–1.030)

## 2016-05-25 LAB — BASIC METABOLIC PANEL
ANION GAP: 11 (ref 5–15)
BUN: 20 mg/dL (ref 6–20)
CO2: 22 mmol/L (ref 22–32)
Calcium: 9 mg/dL (ref 8.9–10.3)
Chloride: 103 mmol/L (ref 101–111)
Creatinine, Ser: 1.76 mg/dL — ABNORMAL HIGH (ref 0.61–1.24)
GFR, EST AFRICAN AMERICAN: 48 mL/min — AB (ref >60)
GFR, EST NON AFRICAN AMERICAN: 41 mL/min — AB (ref >60)
GLUCOSE: 258 mg/dL — AB (ref 65–99)
POTASSIUM: 3.5 mmol/L (ref 3.5–5.1)
Sodium: 136 mmol/L (ref 135–145)

## 2016-05-25 LAB — CBC
HEMATOCRIT: 34.5 % — AB (ref 40.0–52.0)
HEMOGLOBIN: 12 g/dL — AB (ref 13.0–18.0)
MCH: 32.2 pg (ref 26.0–34.0)
MCHC: 34.9 g/dL (ref 32.0–36.0)
MCV: 92.1 fL (ref 80.0–100.0)
Platelets: 185 10*3/uL (ref 150–440)
RBC: 3.74 MIL/uL — AB (ref 4.40–5.90)
RDW: 13.3 % (ref 11.5–14.5)
WBC: 9.2 10*3/uL (ref 3.8–10.6)

## 2016-05-25 NOTE — Discharge Instructions (Signed)
Please follow up with your doctor as we discussed. Today your lab work CT scan chest x-ray and urinalysis were all unremarkable. You may need additional studies if you or dizziness and weakness continue. Please contact her doctor and schedule an appointment. If you feel like you or worsening please return immediately to the emergency department

## 2016-05-25 NOTE — ED Provider Notes (Signed)
[facility] Emergency Department Provider Note   ____________________________________________    I have reviewed the triage vital signs and the nursing notes.   HISTORY  Chief Complaint Weakness and Shoulder Pain     HPI Danny Cook. is a 55 y.o. male who presents with complaints of fatigue, diffuse weakness and vertigo symptoms. Patient reports he has had vertigo symptoms for years and seems to be gradually worsening over the last several months. He is having it worked up by his PCP. He denies neuro deficits. No fevers or chills. No nausea or vomiting. No headache. No neck pain. He denies cough or dysuria but does report night sweats. No reported fevers. He reports compliance with his medications   Past Medical History:  Diagnosis Date  . Anxiety   . Complication of anesthesia   . Depressed   . Diabetes mellitus without complication ([place])   . High cholesterol   . HIV (human immunodeficiency virus infection) ([place])   . Hypertension   . Schizophrenia, paranoid ([place])   . Sleep apnea     There are no active problems to display for this patient.   Past Surgical History:  Procedure Laterality Date  . COLONOSCOPY WITH PROPOFOL N/A 09/03/2015   Procedure: COLONOSCOPY WITH PROPOFOL;  Surgeon: Lollie Sails, MD;  Location: [facility] ENDOSCOPY;  Service: Endoscopy;  Laterality: N/A;  . DG TEETH FULL      Prior to Admission medications   Medication Sig Start Date End Date Taking? Authorizing Provider  Abacavir-Dolutegravir-Lamivud (TRIUMEQ) 600-50-300 MG TABS Take 1 tablet by mouth daily.    Historical Provider, MD  aspirin 325 MG tablet Take 325 mg by mouth daily.    Historical Provider, MD  clonazePAM (KLONOPIN) 2 MG tablet Take 2 mg by mouth 2 (two) times daily.    Historical Provider, MD  divalproex (DEPAKOTE ER) 250 MG 24 hr tablet Take 250 mg by mouth daily.    Historical Provider, MD  divalproex (DEPAKOTE ER) 250 MG 24 hr tablet Take 500  mg by mouth at bedtime.    Historical Provider, MD  ferrous sulfate 325 (65 FE) MG tablet Take 325 mg by mouth daily with breakfast.    Historical Provider, MD  furosemide (LASIX) 20 MG tablet Take 20 mg by mouth daily.    Historical Provider, MD  gabapentin (NEURONTIN) 100 MG capsule Take 100 mg by mouth 3 (three) times daily.    Historical Provider, MD  HYDROcodone-homatropine (HYCODAN) 5-1.5 MG/5ML syrup Take 5 mLs by mouth every 6 (six) hours as needed for cough.    Historical Provider, MD  ibuprofen (ADVIL,MOTRIN) 800 MG tablet Take 1 tablet (800 mg total) by mouth every 8 (eight) hours as needed for moderate pain. 05/27/15   Paulette Blanch, MD  iloperidone (FANAPT) 4 MG TABS tablet Take 4 mg by mouth 2 (two) times daily.    Historical Provider, MD  lisinopril (PRINIVIL,ZESTRIL) 20 MG tablet Take 20 mg by mouth daily.    Historical Provider, MD  meclizine (ANTIVERT) 25 MG tablet Take 1 tablet (25 mg total) by mouth 3 (three) times daily as needed for dizziness or nausea. 04/12/16   Carrie Mew, MD  metFORMIN (GLUCOPHAGE) 1000 MG tablet Take 1,000 mg by mouth 2 (two) times daily with a meal.    Historical Provider, MD  mirtazapine (REMERON) 30 MG tablet Take 30 mg by mouth at bedtime.    Historical Provider, MD  Multiple Vitamins-Minerals (MULTIVITAMIN WITH MINERALS) tablet Take 1  tablet by mouth daily.    Historical Provider, MD  omeprazole (PRILOSEC) 40 MG capsule Take 40 mg by mouth 2 (two) times daily.    Historical Provider, MD  ondansetron (ZOFRAN ODT) 4 MG disintegrating tablet Take 1 tablet (4 mg total) by mouth every 8 (eight) hours as needed for nausea or vomiting. 04/12/16   Carrie Mew, MD  oxyCODONE-acetaminophen (ROXICET) 5-325 MG tablet Take 1 tablet by mouth every 4 (four) hours as needed for severe pain. 05/27/15   Paulette Blanch, MD  promethazine (PHENERGAN) 6.25 MG/5ML syrup Take 6.25 mg by mouth every 6 (six) hours as needed for nausea or vomiting.    Historical Provider,  MD  rosuvastatin (CRESTOR) 20 MG tablet Take 20 mg by mouth daily.    Historical Provider, MD  sucralfate (CARAFATE) 1 G tablet Take 1 g by mouth 4 (four) times daily -  with meals and at bedtime.    Historical Provider, MD     Allergies Viread [tenofovir disoproxil]  No family history on file.  Social History Social History  Substance Use Topics  . Smoking status: Never Smoker  . Smokeless tobacco: Never Used  . Alcohol use No    Review of Systems  Constitutional: No fever/chills, Night sweats, fatigue Eyes: No visual changes.  ENT: No sore throat. Cardiovascular: Denies chest pain. Respiratory: Denies shortness of breath. Gastrointestinal: No abdominal pain.  Genitourinary: Negative for dysuria. Musculoskeletal: Negative for back pain.  Neurological: Negative for headaches or weakness  10-point ROS otherwise negative.  ____________________________________________   PHYSICAL EXAM:  VITAL SIGNS: ED Triage Vitals  Enc Vitals Group     BP 05/25/16 1627 (!) 103/55     Pulse Rate 05/25/16 1627 88     Resp 05/25/16 1627 20     Temp 05/25/16 1627 97.8 F (36.6 C)     Temp Source 05/25/16 1627 Oral     SpO2 05/25/16 1627 95 %     Weight 05/25/16 1625 218 lb (98.9 kg)     Height 05/25/16 1625 5\' 7"  (1.702 m)     Head Circumference --      Peak Flow --      Pain Score --      Pain Loc --      Pain Edu? --      Excl. in [place]? --     Constitutional: Alert and oriented. No acute distress. Pleasant and interactive Eyes: Conjunctivae are normal. PERRLA, EOMI  Nose: No congestion/rhinnorhea. Mouth/Throat: Mucous membranes are moist.   Neck:  Painless ROM Cardiovascular: Normal rate, regular rhythm. Grossly normal heart sounds.  Good peripheral circulation. Respiratory: Normal respiratory effort.  No retractions. Lungs CTAB. Gastrointestinal: Soft and nontender. No distention.  No CVA tenderness. Genitourinary: deferred Musculoskeletal: No lower extremity tenderness  nor edema.  Warm and well perfused Neurologic:  Normal speech and language. No gross focal neurologic deficits are appreciated. Cranial nerves II-12 are normal, normal ambulation Skin:  Skin is warm, dry and intact. No rash noted. Psychiatric: Mood and affect are normal. Speech and behavior are normal.  ____________________________________________   LABS (all labs ordered are listed, but only abnormal results are displayed)  Labs Reviewed  BASIC METABOLIC PANEL - Abnormal; Notable for the following:       Result Value   Glucose, Bld 258 (*)    Creatinine, Ser 1.76 (*)    GFR calc non Af Amer 41 (*)    GFR calc Af Amer 48 (*)    All other  components within normal limits  CBC - Abnormal; Notable for the following:    RBC 3.74 (*)    Hemoglobin 12.0 (*)    HCT 34.5 (*)    All other components within normal limits  URINALYSIS COMPLETEWITH MICROSCOPIC ([facility] ONLY) - Abnormal; Notable for the following:    Color, Urine YELLOW (*)    APPearance CLEAR (*)    Glucose, UA 150 (*)    Protein, ur 100 (*)    Leukocytes, UA TRACE (*)    Bacteria, UA RARE (*)    Squamous Epithelial / LPF 0-5 (*)    All other components within normal limits   ____________________________________________  EKG  ED ECG REPORT I, Lavonia Drafts, the attending physician, personally viewed and interpreted this ECG.  Date: 05/25/2016  Rate: 81 Rhythm: normal sinus rhythm QRS Axis: normal Intervals: normal ST/T Wave abnormalities: normal Conduction Disturbances: Occasional PVC Narrative Interpretation: unremarkable  ____________________________________________  RADIOLOGY  CT head unremarkable Chest x-ray unremarkable ____________________________________________   PROCEDURES  Procedure(s) performed: No    Critical Care performed: No ____________________________________________   INITIAL IMPRESSION / ASSESSMENT AND PLAN / ED COURSE  Pertinent labs & imaging results that were available  during my care of the patient were reviewed by me and considered in my medical decision making (see chart for details).  Patient well-appearing and in no acute distress. Neurologically intact. No headache. No fevers. Compliant with medications. Workup is unremarkable. he is well-appearing. He does have MRI scheduled as an outpatient. Did not feel further testing is necessary at this time given the chronicity of the complaint  Clinical Course    ____________________________________________   FINAL CLINICAL IMPRESSION(S) / ED DIAGNOSES  Final diagnoses:  Weakness      NEW MEDICATIONS STARTED DURING THIS VISIT:  Discharge Medication List as of 05/25/2016  8:20 PM       Note:  This document was prepared using Dragon voice recognition software and may include unintentional dictation errors.    Lavonia Drafts, MD 05/25/16 2059

## 2016-05-25 NOTE — ED Notes (Signed)
Pt ambulated with RN, slow but steady gait noted. MD made aware. (No assisted devices used)

## 2016-05-25 NOTE — ED Triage Notes (Signed)
States 3 days ago he developed chills and sweats   Weakness    Then had pain to both shoulders 2 days ago  But denies any pain a present

## 2016-06-03 ENCOUNTER — Ambulatory Visit: Payer: Medicare Other

## 2016-06-09 ENCOUNTER — Ambulatory Visit
Admission: RE | Admit: 2016-06-09 | Discharge: 2016-06-09 | Disposition: A | Discharge status: Home / Self Care | Payer: Medicare Other | Source: Ambulatory Visit | Attending: Internal Medicine | Admitting: Internal Medicine

## 2016-06-09 DIAGNOSIS — R51 Headache: Secondary | ICD-10-CM | POA: Insufficient documentation

## 2016-06-09 DIAGNOSIS — R519 Headache, unspecified: Secondary | ICD-10-CM

## 2016-06-09 MED ORDER — GADOBENATE DIMEGLUMINE 529 MG/ML IV SOLN
20.0000 mL | Freq: Once | INTRAVENOUS | Status: AC | PRN
  Administered 2016-06-09: 20 mL via INTRAVENOUS

## 2016-06-21 ENCOUNTER — Emergency Department: Payer: Medicare Other

## 2016-06-21 ENCOUNTER — Encounter: Payer: Self-pay | Admitting: Medical Oncology

## 2016-06-21 ENCOUNTER — Emergency Department
Admission: EM | Admit: 2016-06-21 | Discharge: 2016-06-21 | Disposition: A | Discharge status: Home / Self Care | Payer: Medicare Other | Attending: Student in an Organized Health Care Education/Training Program | Admitting: Student in an Organized Health Care Education/Training Program

## 2016-06-21 DIAGNOSIS — Z7984 Long term (current) use of oral hypoglycemic drugs: Secondary | ICD-10-CM | POA: Insufficient documentation

## 2016-06-21 DIAGNOSIS — Z791 Long term (current) use of non-steroidal anti-inflammatories (NSAID): Secondary | ICD-10-CM | POA: Insufficient documentation

## 2016-06-21 DIAGNOSIS — N3000 Acute cystitis without hematuria: Secondary | ICD-10-CM | POA: Diagnosis not present

## 2016-06-21 DIAGNOSIS — R1032 Left lower quadrant pain: Secondary | ICD-10-CM

## 2016-06-21 DIAGNOSIS — I10 Essential (primary) hypertension: Secondary | ICD-10-CM | POA: Insufficient documentation

## 2016-06-21 DIAGNOSIS — Z21 Asymptomatic human immunodeficiency virus [HIV] infection status: Secondary | ICD-10-CM | POA: Diagnosis not present

## 2016-06-21 DIAGNOSIS — Z7982 Long term (current) use of aspirin: Secondary | ICD-10-CM | POA: Insufficient documentation

## 2016-06-21 DIAGNOSIS — E119 Type 2 diabetes mellitus without complications: Secondary | ICD-10-CM | POA: Diagnosis not present

## 2016-06-21 LAB — CBC
HCT: 35.7 % — ABNORMAL LOW (ref 40.0–52.0)
HEMOGLOBIN: 12.3 g/dL — AB (ref 13.0–18.0)
MCH: 31.1 pg (ref 26.0–34.0)
MCHC: 34.5 g/dL (ref 32.0–36.0)
MCV: 90.3 fL (ref 80.0–100.0)
PLATELETS: 234 10*3/uL (ref 150–440)
RBC: 3.96 MIL/uL — AB (ref 4.40–5.90)
RDW: 12.7 % (ref 11.5–14.5)
WBC: 11.5 10*3/uL — ABNORMAL HIGH (ref 3.8–10.6)

## 2016-06-21 LAB — COMPREHENSIVE METABOLIC PANEL
ALBUMIN: 3.5 g/dL (ref 3.5–5.0)
ALK PHOS: 74 U/L (ref 38–126)
ALT: 12 U/L — AB (ref 17–63)
AST: 24 U/L (ref 15–41)
Anion gap: 11 (ref 5–15)
BUN: 15 mg/dL (ref 6–20)
CALCIUM: 9.2 mg/dL (ref 8.9–10.3)
CHLORIDE: 107 mmol/L (ref 101–111)
CO2: 23 mmol/L (ref 22–32)
CREATININE: 1.26 mg/dL — AB (ref 0.61–1.24)
GFR calc non Af Amer: >60 mL/min (ref >60)
GLUCOSE: 139 mg/dL — AB (ref 65–99)
Potassium: 3.8 mmol/L (ref 3.5–5.1)
SODIUM: 141 mmol/L (ref 135–145)
Total Bilirubin: 0.3 mg/dL (ref 0.3–1.2)
Total Protein: 7.4 g/dL (ref 6.5–8.1)

## 2016-06-21 LAB — URINALYSIS, COMPLETE (UACMP) WITH MICROSCOPIC
BILIRUBIN URINE: NEGATIVE
Glucose, UA: NEGATIVE mg/dL
Hgb urine dipstick: NEGATIVE
KETONES UR: 5 mg/dL — AB
Nitrite: NEGATIVE
Protein, ur: >=300 mg/dL — AB
SQUAMOUS EPITHELIAL / LPF: NONE SEEN
Specific Gravity, Urine: 1.028 (ref 1.005–1.030)
pH: 5 (ref 5.0–8.0)

## 2016-06-21 LAB — CHLAMYDIA/NGC RT PCR (ARMC ONLY)
CHLAMYDIA TR: NOT DETECTED
N gonorrhoeae: NOT DETECTED

## 2016-06-21 LAB — LIPASE, BLOOD: LIPASE: 26 U/L (ref 11–51)

## 2016-06-21 MED ORDER — CEPHALEXIN 500 MG PO CAPS
500.0000 mg | ORAL_CAPSULE | Freq: Once | ORAL | Status: AC
  Administered 2016-06-21: 500 mg via ORAL
  Filled 2016-06-21: qty 1

## 2016-06-21 MED ORDER — CEPHALEXIN 500 MG PO CAPS: 500.0000 mg | ORAL_CAPSULE | Freq: Three times a day (TID) | ORAL | 0 refills | Status: AC

## 2016-06-21 MED ORDER — IOPAMIDOL (ISOVUE-300) INJECTION 61%
100.0000 mL | Freq: Once | INTRAVENOUS | Status: AC | PRN
  Administered 2016-06-21: 100 mL via INTRAVENOUS

## 2016-06-21 NOTE — ED Triage Notes (Signed)
Pt reports for 4 days he has been having left lower abd pain that has worsened, denies NVD, pt reports pain worsens with laying down. Pt denies dysuria

## 2016-06-21 NOTE — ED Provider Notes (Signed)
[facility] Emergency Department Provider Note    First MD Initiated Contact with Patient 06/21/16 1415     (approximate)  I have reviewed the triage vital signs and the nursing notes.   HISTORY  Chief Complaint Abdominal Pain    HPI Danny Cook. is a 55 y.o. male history of HIV and schizophrenia presents with left lower abdominal pain for the past 4 days that is steadily worsening. States worse when he moves or lays down. Denies any dysuria but has noticed a different color. Denies any diarrhea. No constipation. No nausea vomiting. No measured fevers. States that he has been compliant with his home medications. Is unaware of being diagnosed with diverticulosis.   Past Medical History:  Diagnosis Date  . Anxiety   . Complication of anesthesia   . Depressed   . Diabetes mellitus without complication ([place])   . High cholesterol   . HIV (human immunodeficiency virus infection) ([place])   . Hypertension   . Schizophrenia, paranoid ([place])   . Sleep apnea    No family history on file. Past Surgical History:  Procedure Laterality Date  . COLONOSCOPY WITH PROPOFOL N/A 09/03/2015   Procedure: COLONOSCOPY WITH PROPOFOL;  Surgeon: Lollie Sails, MD;  Location: [facility] ENDOSCOPY;  Service: Endoscopy;  Laterality: N/A;  . DG TEETH FULL     There are no active problems to display for this patient.     Prior to Admission medications   Medication Sig Start Date End Date Taking? Authorizing Provider  Abacavir-Dolutegravir-Lamivud (TRIUMEQ) 600-50-300 MG TABS Take 1 tablet by mouth daily.    Historical Provider, MD  aspirin 325 MG tablet Take 325 mg by mouth daily.    Historical Provider, MD  cephALEXin (KEFLEX) 500 MG capsule Take 1 capsule (500 mg total) by mouth 3 (three) times daily. 06/21/16 06/28/16  Merlyn Lot, MD  clonazePAM (KLONOPIN) 2 MG tablet Take 2 mg by mouth 2 (two) times daily.    Historical Provider, MD  divalproex (DEPAKOTE ER) 250  MG 24 hr tablet Take 250 mg by mouth daily.    Historical Provider, MD  divalproex (DEPAKOTE ER) 250 MG 24 hr tablet Take 500 mg by mouth at bedtime.    Historical Provider, MD  ferrous sulfate 325 (65 FE) MG tablet Take 325 mg by mouth daily with breakfast.    Historical Provider, MD  furosemide (LASIX) 20 MG tablet Take 20 mg by mouth daily.    Historical Provider, MD  gabapentin (NEURONTIN) 100 MG capsule Take 100 mg by mouth 3 (three) times daily.    Historical Provider, MD  HYDROcodone-homatropine (HYCODAN) 5-1.5 MG/5ML syrup Take 5 mLs by mouth every 6 (six) hours as needed for cough.    Historical Provider, MD  ibuprofen (ADVIL,MOTRIN) 800 MG tablet Take 1 tablet (800 mg total) by mouth every 8 (eight) hours as needed for moderate pain. 05/27/15   Paulette Blanch, MD  iloperidone (FANAPT) 4 MG TABS tablet Take 4 mg by mouth 2 (two) times daily.    Historical Provider, MD  lisinopril (PRINIVIL,ZESTRIL) 20 MG tablet Take 20 mg by mouth daily.    Historical Provider, MD  meclizine (ANTIVERT) 25 MG tablet Take 1 tablet (25 mg total) by mouth 3 (three) times daily as needed for dizziness or nausea. 04/12/16   Carrie Mew, MD  metFORMIN (GLUCOPHAGE) 1000 MG tablet Take 1,000 mg by mouth 2 (two) times daily with a meal.    Historical Provider, MD  mirtazapine (REMERON)  30 MG tablet Take 30 mg by mouth at bedtime.    Historical Provider, MD  Multiple Vitamins-Minerals (MULTIVITAMIN WITH MINERALS) tablet Take 1 tablet by mouth daily.    Historical Provider, MD  omeprazole (PRILOSEC) 40 MG capsule Take 40 mg by mouth 2 (two) times daily.    Historical Provider, MD  ondansetron (ZOFRAN ODT) 4 MG disintegrating tablet Take 1 tablet (4 mg total) by mouth every 8 (eight) hours as needed for nausea or vomiting. 04/12/16   Carrie Mew, MD  oxyCODONE-acetaminophen (ROXICET) 5-325 MG tablet Take 1 tablet by mouth every 4 (four) hours as needed for severe pain. 05/27/15   Paulette Blanch, MD  promethazine  (PHENERGAN) 6.25 MG/5ML syrup Take 6.25 mg by mouth every 6 (six) hours as needed for nausea or vomiting.    Historical Provider, MD  rosuvastatin (CRESTOR) 20 MG tablet Take 20 mg by mouth daily.    Historical Provider, MD  sucralfate (CARAFATE) 1 G tablet Take 1 g by mouth 4 (four) times daily -  with meals and at bedtime.    Historical Provider, MD    Allergies Viread [tenofovir disoproxil]    Social History Social History  Substance Use Topics  . Smoking status: Never Smoker  . Smokeless tobacco: Never Used  . Alcohol use No    Review of Systems Patient denies headaches, rhinorrhea, blurry vision, numbness, shortness of breath, chest pain, edema, cough, abdominal pain, nausea, vomiting, diarrhea, dysuria, fevers, rashes or hallucinations unless otherwise stated above in HPI. ____________________________________________   PHYSICAL EXAM:  VITAL SIGNS: Vitals:   06/21/16 1500 06/21/16 1613  BP: 132/79 (!) 140/91  Pulse: 72 79  Resp:  18  Temp:      Constitutional: Alert and oriented. Well appearing and in no acute distress. Eyes: Conjunctivae are normal. PERRL. EOMI. Head: Atraumatic. Nose: No congestion/rhinnorhea. Mouth/Throat: Mucous membranes are moist.  Oropharynx non-erythematous. Neck: No stridor. Painless ROM. No cervical spine tenderness to palpation Hematological/Lymphatic/Immunilogical: No cervical lymphadenopathy. Cardiovascular: Normal rate, regular rhythm. Grossly normal heart sounds.  Good peripheral circulation. Respiratory: Normal respiratory effort.  No retractions. Lungs CTAB. Gastrointestinal: Soft with mild LLQ ttp. No distention. No abdominal bruits. No CVA tenderness.  Musculoskeletal: No lower extremity tenderness nor edema.  No joint effusions. Neurologic:  Normal speech and language. No gross focal neurologic deficits are appreciated. No gait instability. Skin:  Skin is warm, dry and intact. No rash noted. Psychiatric: Mood and affect are  normal. Speech and behavior are normal.  ____________________________________________   LABS (all labs ordered are listed, but only abnormal results are displayed)  Results for orders placed or performed during the hospital encounter of 06/21/16 (from the past 24 hour(s))  Lipase, blood     Status: None   Collection Time: 06/21/16 11:56 AM  Result Value Ref Range   Lipase 26 11 - 51 U/L  Comprehensive metabolic panel     Status: Abnormal   Collection Time: 06/21/16 11:56 AM  Result Value Ref Range   Sodium 141 135 - 145 mmol/L   Potassium 3.8 3.5 - 5.1 mmol/L   Chloride 107 101 - 111 mmol/L   CO2 23 22 - 32 mmol/L   Glucose, Bld 139 (H) 65 - 99 mg/dL   BUN 15 6 - 20 mg/dL   Creatinine, Ser 1.26 (H) 0.61 - 1.24 mg/dL   Calcium 9.2 8.9 - 10.3 mg/dL   Total Protein 7.4 6.5 - 8.1 g/dL   Albumin 3.5 3.5 - 5.0 g/dL   AST  24 15 - 41 U/L   ALT 12 (L) 17 - 63 U/L   Alkaline Phosphatase 74 38 - 126 U/L   Total Bilirubin 0.3 0.3 - 1.2 mg/dL   GFR calc non Af Amer >60 >60 mL/min   GFR calc Af Amer >60 >60 mL/min   Anion gap 11 5 - 15  CBC     Status: Abnormal   Collection Time: 06/21/16 11:56 AM  Result Value Ref Range   WBC 11.5 (H) 3.8 - 10.6 K/uL   RBC 3.96 (L) 4.40 - 5.90 MIL/uL   Hemoglobin 12.3 (L) 13.0 - 18.0 g/dL   HCT 35.7 (L) 40.0 - 52.0 %   MCV 90.3 80.0 - 100.0 fL   MCH 31.1 26.0 - 34.0 pg   MCHC 34.5 32.0 - 36.0 g/dL   RDW 12.7 11.5 - 14.5 %   Platelets 234 150 - 440 K/uL  Urinalysis, Complete w Microscopic     Status: Abnormal   Collection Time: 06/21/16  1:26 PM  Result Value Ref Range   Color, Urine YELLOW (A) YELLOW   APPearance CLEAR (A) CLEAR   Specific Gravity, Urine 1.028 1.005 - 1.030   pH 5.0 5.0 - 8.0   Glucose, UA NEGATIVE NEGATIVE mg/dL   Hgb urine dipstick NEGATIVE NEGATIVE   Bilirubin Urine NEGATIVE NEGATIVE   Ketones, ur 5 (A) NEGATIVE mg/dL   Protein, ur >=300 (A) NEGATIVE mg/dL   Nitrite NEGATIVE NEGATIVE   Leukocytes, UA MODERATE (A)  NEGATIVE   RBC / HPF 6-30 0 - 5 RBC/hpf   WBC, UA TOO NUMEROUS TO COUNT 0 - 5 WBC/hpf   Bacteria, UA RARE (A) NONE SEEN   Squamous Epithelial / LPF NONE SEEN NONE SEEN   Mucous PRESENT    Sperm, UA PRESENT   Chlamydia/NGC rt PCR ([facility] only)     Status: None   Collection Time: 06/21/16  2:37 PM  Result Value Ref Range   Specimen source GC/Chlam URINE, RANDOM    Chlamydia Tr NOT DETECTED NOT DETECTED   N gonorrhoeae NOT DETECTED NOT DETECTED   ____________________________________________  EKG_______________________________  RADIOLOGY  I personally reviewed all radiographic images ordered to evaluate for the above acute complaints and reviewed radiology reports and findings.  These findings were personally discussed with the patient.  Please see medical record for radiology report.  ____________________________________________   PROCEDURES  Procedure(s) performed:  Procedures    Critical Care performed: no ____________________________________________   INITIAL IMPRESSION / ASSESSMENT AND PLAN / ED COURSE  Pertinent labs & imaging results that were available during my care of the patient were reviewed by me and considered in my medical decision making (see chart for details).  DDX: diverticulitis, stone, colitis, fistula, pyelonephritis  Danny Cook. is a 55 y.o. who presents to the ED with 4 days of left lower abdominal pain as described above. Patient arrives afebrile but mildly tachycardic. Based on his pain and mild leukocytosis will order CT imaging to evaluate for any evidence of abscess or diverticulosis. Urine does show evidence of protein and probable infection the patient denies any dysuria.  Clinical Course as of Jun 21 2028  Sat Jun 21, 2016  1545 CT without any evidence of acute abnormality. Based on presentation will treat empirically for acute cystitis. Patient remains he mechanically stable without any signs of sepsis at this time.  Will start on  keflex   [PR]  [id number] Patient was able to tolerate PO and was able to ambulate with  a steady gait.  Have discussed with the patient and available family all diagnostics and treatments performed thus far and all questions were answered to the best of my ability. The patient demonstrates understanding and agreement with plan.   [PR]    Clinical Course User Index [PR] Merlyn Lot, MD     ____________________________________________   FINAL CLINICAL IMPRESSION(S) / ED DIAGNOSES  Final diagnoses:  Left lower quadrant pain  Acute cystitis without hematuria      NEW MEDICATIONS STARTED DURING THIS VISIT:  Discharge Medication List as of 06/21/2016  4:02 PM    START taking these medications   Details  cephALEXin (KEFLEX) 500 MG capsule Take 1 capsule (500 mg total) by mouth 3 (three) times daily., Starting Sat 06/21/2016, Until Sat 06/28/2016, Print         Note:  This document was prepared using Dragon voice recognition software and may include unintentional dictation errors.    Merlyn Lot, MD 06/21/16 2029

## 2016-06-21 NOTE — ED Notes (Signed)

## 2016-06-23 LAB — URINE CULTURE: Culture: NO GROWTH

## 2016-06-27 ENCOUNTER — Encounter: Payer: Self-pay | Admitting: *Deleted

## 2016-07-01 ENCOUNTER — Ambulatory Visit
Admission: RE | Admit: 2016-07-01 | Discharge: 2016-07-01 | Disposition: A | Discharge status: Home / Self Care | Payer: Medicare Other | Source: Ambulatory Visit | Attending: Gastroenterology | Admitting: Gastroenterology

## 2016-07-01 ENCOUNTER — Encounter: Payer: Self-pay | Admitting: *Deleted

## 2016-07-01 ENCOUNTER — Ambulatory Visit: Payer: Medicare Other | Admitting: Anesthesiology

## 2016-07-01 ENCOUNTER — Encounter
Admission: RE | Disposition: A | Discharge status: Home / Self Care | Payer: Self-pay | Source: Ambulatory Visit | Attending: Gastroenterology

## 2016-07-01 DIAGNOSIS — Z7984 Long term (current) use of oral hypoglycemic drugs: Secondary | ICD-10-CM | POA: Insufficient documentation

## 2016-07-01 DIAGNOSIS — N189 Chronic kidney disease, unspecified: Secondary | ICD-10-CM | POA: Insufficient documentation

## 2016-07-01 DIAGNOSIS — E1122 Type 2 diabetes mellitus with diabetic chronic kidney disease: Secondary | ICD-10-CM | POA: Insufficient documentation

## 2016-07-01 DIAGNOSIS — Z7982 Long term (current) use of aspirin: Secondary | ICD-10-CM | POA: Insufficient documentation

## 2016-07-01 DIAGNOSIS — K224 Dyskinesia of esophagus: Secondary | ICD-10-CM | POA: Diagnosis not present

## 2016-07-01 DIAGNOSIS — G4730 Sleep apnea, unspecified: Secondary | ICD-10-CM | POA: Diagnosis not present

## 2016-07-01 DIAGNOSIS — R1310 Dysphagia, unspecified: Secondary | ICD-10-CM | POA: Insufficient documentation

## 2016-07-01 DIAGNOSIS — I129 Hypertensive chronic kidney disease with stage 1 through stage 4 chronic kidney disease, or unspecified chronic kidney disease: Secondary | ICD-10-CM | POA: Diagnosis not present

## 2016-07-01 DIAGNOSIS — F419 Anxiety disorder, unspecified: Secondary | ICD-10-CM | POA: Diagnosis not present

## 2016-07-01 DIAGNOSIS — K2950 Unspecified chronic gastritis without bleeding: Secondary | ICD-10-CM | POA: Insufficient documentation

## 2016-07-01 DIAGNOSIS — F200 Paranoid schizophrenia: Secondary | ICD-10-CM | POA: Diagnosis not present

## 2016-07-01 DIAGNOSIS — K3189 Other diseases of stomach and duodenum: Secondary | ICD-10-CM | POA: Diagnosis not present

## 2016-07-01 DIAGNOSIS — E7800 Pure hypercholesterolemia, unspecified: Secondary | ICD-10-CM | POA: Insufficient documentation

## 2016-07-01 HISTORY — DX: Anemia, unspecified: D64.9

## 2016-07-01 HISTORY — DX: Kaposi's sarcoma, unspecified: C46.9

## 2016-07-01 HISTORY — DX: Delusional disorders: F22

## 2016-07-01 HISTORY — PX: ESOPHAGOGASTRODUODENOSCOPY (EGD) WITH PROPOFOL: SHX5813

## 2016-07-01 HISTORY — DX: Chronic kidney disease, unspecified: N18.9

## 2016-07-01 LAB — GLUCOSE, CAPILLARY: GLUCOSE-CAPILLARY: 104 mg/dL — AB (ref 65–99)

## 2016-07-01 SURGERY — ESOPHAGOGASTRODUODENOSCOPY (EGD) WITH PROPOFOL
Anesthesia: General

## 2016-07-01 MED ORDER — PROPOFOL 500 MG/50ML IV EMUL
INTRAVENOUS | Status: DC | PRN
  Administered 2016-07-01: 160 ug/kg/min via INTRAVENOUS

## 2016-07-01 MED ORDER — FENTANYL CITRATE (PF) 100 MCG/2ML IJ SOLN
INTRAMUSCULAR | Status: DC | PRN
  Administered 2016-07-01: 50 ug via INTRAVENOUS

## 2016-07-01 MED ORDER — SODIUM CHLORIDE 0.9 % IV SOLN
INTRAVENOUS | Status: DC
  Administered 2016-07-01: 14:00:00 via INTRAVENOUS
  Administered 2016-07-01: 1000 mL via INTRAVENOUS

## 2016-07-01 MED ORDER — MIDAZOLAM HCL 2 MG/2ML IJ SOLN
INTRAMUSCULAR | Status: AC
  Filled 2016-07-01: qty 2

## 2016-07-01 MED ORDER — FENTANYL CITRATE (PF) 100 MCG/2ML IJ SOLN
INTRAMUSCULAR | Status: AC
  Filled 2016-07-01: qty 2

## 2016-07-01 MED ORDER — MIDAZOLAM HCL 2 MG/2ML IJ SOLN
INTRAMUSCULAR | Status: DC | PRN
  Administered 2016-07-01 (×2): 1 mg via INTRAVENOUS

## 2016-07-01 MED ORDER — PROPOFOL 500 MG/50ML IV EMUL
INTRAVENOUS | Status: AC
  Filled 2016-07-01: qty 50

## 2016-07-01 MED ORDER — PROPOFOL 10 MG/ML IV BOLUS
INTRAVENOUS | Status: AC
  Filled 2016-07-01: qty 20

## 2016-07-01 MED ORDER — SODIUM CHLORIDE 0.9 % IV SOLN: INTRAVENOUS | Status: DC

## 2016-07-01 NOTE — Transfer of Care (Signed)
Immediate Anesthesia Transfer of Care Note  Patient: Danny Cook.  Procedure(s) Performed: Procedure(s): ESOPHAGOGASTRODUODENOSCOPY (EGD) WITH PROPOFOL (N/A)  Patient Location: PACU  Anesthesia Type:General  Level of Consciousness: sedated  Airway & Oxygen Therapy: Patient Spontanous Breathing and Patient connected to nasal cannula oxygen  Post-op Assessment: Report given to RN and Post -op Vital signs reviewed and stable  Post vital signs: Reviewed and stable  Last Vitals:  Vitals:   07/01/16 1224  BP: (!) 131/92  Pulse: 87  Resp: 18  Temp: 37 C    Last Pain:  Vitals:   07/01/16 1224  TempSrc: Tympanic         Complications: No apparent anesthesia complications

## 2016-07-01 NOTE — Op Note (Signed)
[facility] Patient Name: Danny Cook Procedure Date: 07/01/2016 1:41 PM MRN: [id number] Account #: [id number] Date of Birth: [birth year] Admit Type: Outpatient Age: 56 Room: [facility] ENDO ROOM 3 Gender: Male Note Status: Finalized Procedure:            Upper GI endoscopy Indications:          Dysphagia Providers:            Lollie Sails, MD Referring MD:         Tracie Harrier, MD (Referring MD) Medicines:            Monitored Anesthesia Care Complications:        No immediate complications. Procedure:            Pre-Anesthesia Assessment:                       - ASA Grade Assessment: III - A patient with severe                        systemic disease.                       After obtaining informed consent, the endoscope was                        passed under direct vision. Throughout the procedure,                        the patient's blood pressure, pulse, and oxygen                        saturations were monitored continuously. The Endoscope                        was introduced through the mouth, and advanced to the                        third part of duodenum. The upper GI endoscopy was                        accomplished without difficulty. The patient tolerated                        the procedure well. Findings:      The Z-line was regular. Biopsies were taken with a cold forceps for       histology.      Abnormal motility was noted in the mid esophagus and in the distal       esophagus. The cricopharyngeus was normal. There are extra peristaltic       waves in the esophageal body. Tertiary peristaltic waves are noted.      Patchy mild inflammation characterized by erosions, some of these       appearing as a nodule, and erythema was found in the gastric antrum.       Biopsies were taken with a cold forceps for histology.      A few localized, small non-bleeding erosions were found in the       prepyloric region of the stomach.  There were no stigmata of recent       bleeding. Biopsies were taken with a cold forceps for histology.  The cardia and gastric fundus were normal on retroflexion.      The examined duodenum was normal. Impression:           - Z-line regular. Biopsied.                       - Abnormal esophageal motility, suspicious for                        presbyesophagus.                       - Bile gastritis. Biopsied.                       - Non-bleeding erosive gastropathy. Biopsied.                       - Normal examined duodenum. Recommendation:       - Perform a modified barium swallow at appointment to                        be scheduled.                       - Return to GI clinic in 1 month. Procedure Code(s):    --- Professional ---                       [phone], Esophagogastroduodenoscopy, flexible, transoral;                        with biopsy, single or multiple Diagnosis Code(s):    --- Professional ---                       K22.4, Dyskinesia of esophagus                       K29.60, Other gastritis without bleeding                       K31.89, Other diseases of stomach and duodenum                       R13.10, Dysphagia, unspecified CPT copyright 2016 [employer]. All rights reserved. The codes documented in this report are preliminary and upon coder review may  be revised to meet current compliance requirements. Lollie Sails, MD 07/01/2016 2:12:58 PM This report has been signed electronically. Number of Addenda: 0 Note Initiated On: 07/01/2016 1:41 PM      [facility]

## 2016-07-01 NOTE — Anesthesia Procedure Notes (Signed)
Date/Time: 07/01/2016 1:55 PM Performed by: Allean Found Pre-anesthesia Checklist: Patient identified, Emergency Drugs available, Suction available, Patient being monitored and Timeout performed Patient Re-evaluated:Patient Re-evaluated prior to inductionOxygen Delivery Method: Nasal cannula

## 2016-07-01 NOTE — Anesthesia Preprocedure Evaluation (Addendum)
Anesthesia Evaluation  Patient identified by MRN, date of birth, ID band Patient awake    Reviewed: Allergy & Precautions, H&P , NPO status , Patient's Chart, lab work & pertinent test results  History of Anesthesia Complications Negative for: history of anesthetic complications  Airway Mallampati: III  TM Distance: <3 FB Neck ROM: limited    Dental  (+) Poor Dentition, Chipped, Missing   Pulmonary neg shortness of breath, sleep apnea ,    Pulmonary exam normal breath sounds clear to auscultation       Cardiovascular hypertension, (-) angina(-) Past MI and (-) DOE Normal cardiovascular exam Rhythm:regular Rate:Normal     Neuro/Psych PSYCHIATRIC DISORDERS Anxiety Depression Schizophrenia negative neurological ROS     GI/Hepatic negative GI ROS, Neg liver ROS,   Endo/Other  diabetes  Renal/GU Renal disease  negative genitourinary   Musculoskeletal   Abdominal   Peds  Hematology negative hematology ROS (+)   Anesthesia Other Findings Past Medical History: No date: Anemia No date: Anxiety No date: Chronic kidney disease     Comment: Renal Insufficiency Syndrome;               Glomerulosclerosis 0000000 No date: Complication of anesthesia No date: Depressed No date: Diabetes mellitus without complication (HCC) No date: High cholesterol No date: HIV (human immunodeficiency virus infection) (* No date: Hypertension No date: 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[place]) No date: Paranoid disorder ([place]) No date: Schizophrenia, paranoid ([place]) No date: Sleep apnea  Past Surgical History: 09/03/2015: COLONOSCOPY WITH PROPOFOL N/A     Comment: Procedure: COLONOSCOPY WITH PROPOFOL;                Surgeon: Lollie Sails, MD;  Location: [facility]              ENDOSCOPY;  Service: Endoscopy;  Laterality:               N/A; No date: DG TEETH FULL  BMI    Body Mass Index:  34.14 kg/m      Reproductive/Obstetrics negative OB ROS                             Anesthesia Physical Anesthesia Plan  ASA: III  Anesthesia Plan: General   Post-op Pain Management:    Induction:   Airway Management Planned:   Additional Equipment:   Intra-op Plan:   Post-operative Plan:   Informed Consent: I have reviewed the patients History and Physical, chart, labs and discussed the procedure including the risks, benefits and alternatives for the proposed anesthesia with the patient or authorized representative who has indicated his/her understanding and acceptance.   Dental Advisory Given  Plan Discussed with: Anesthesiologist, CRNA and Surgeon  Anesthesia Plan Comments:         Anesthesia Quick Evaluation

## 2016-07-01 NOTE — H&P (Signed)
Outpatient short stay form Pre-procedure 07/01/2016 1:35 PM Danny Sails MD  Primary Physician: Dr Tracie Harrier  Reason for visit:  EGD  History of present illness:  Patient is a 56 year old male presenting today as above. He has a complaint of some cervical dysphagia. He does not regurgitate foods. He does have a room swallow which was done on 08/10/2015 showing multiple prominent cervical and plate osteophytes causing deformity in in the posterior cervical esophagus as well as evidence of presbyesophagus. His symptom is essentially in the low cervical region. He does take a proton pump inhibitor. He has not had a modified barium swallow. He denies any pill dysphagia or liquid dysphagia. Things seem to slow down rather than occluded. His be consistent with the findings on the barium swallow study.    Current Facility-Administered Medications:  .  0.9 %  sodium chloride infusion, , Intravenous, Continuous, Danny Sails, MD, Last Rate: 20 mL/hr at 07/01/16 1242, 1,000 mL at 07/01/16 1242 .  0.9 %  sodium chloride infusion, , Intravenous, Continuous, Danny Sails, MD  Prescriptions Prior to Admission  Medication Sig Dispense Refill Last Dose  . Abacavir-Dolutegravir-Lamivud (TRIUMEQ) 600-50-300 MG TABS Take 1 tablet by mouth daily.   06/30/2016 at Unknown time  . aspirin 325 MG tablet Take 325 mg by mouth daily.   Past Week at Unknown time  . Brexpiprazole 2 MG TABS Take 1 tablet by mouth daily.   07/01/2016 at 0800  . Choline Fenofibrate 45 MG capsule Take 45 mg by mouth daily.   Past Week at Unknown time  . clonazePAM (KLONOPIN) 2 MG tablet Take 2 mg by mouth 2 (two) times daily.   07/01/2016 at 0800  . clorazepate (TRANXENE) 15 MG tablet Take 15 mg by mouth at bedtime.   07/01/2016 at 0800  . Cyanocobalamin (VITAMIN B 12 PO) Take 1,000 mcg by mouth daily.   Past Week at Unknown time  . divalproex (DEPAKOTE ER) 250 MG 24 hr tablet Take 500 mg by mouth at bedtime.   07/01/2016 at 0800   . ferrous sulfate 325 (65 FE) MG tablet Take 325 mg by mouth daily with breakfast.   Past Week at Unknown time  . gabapentin (NEURONTIN) 100 MG capsule Take 100 mg by mouth 3 (three) times daily.   07/01/2016 at 0800  . glimepiride (AMARYL) 4 MG tablet Take 8 mg by mouth daily with breakfast.   06/30/2016 at Unknown time  . ibuprofen (ADVIL,MOTRIN) 800 MG tablet Take 1 tablet (800 mg total) by mouth every 8 (eight) hours as needed for moderate pain. 15 tablet 0 Past Week at Unknown time  . iloperidone (FANAPT) 4 MG TABS tablet Take 4 mg by mouth 2 (two) times daily.   07/01/2016 at 0800  . lisinopril (PRINIVIL,ZESTRIL) 20 MG tablet Take 20 mg by mouth daily.   07/01/2016 at 0800Unknown time  . meclizine (ANTIVERT) 25 MG tablet Take 1 tablet (25 mg total) by mouth 3 (three) times daily as needed for dizziness or nausea. 30 tablet 1 Past Week at Unknown time  . mirtazapine (REMERON) 30 MG tablet Take 30 mg by mouth at bedtime.   06/30/2016 at Unknown time  . Multiple Vitamins-Minerals (MULTIVITAMIN WITH MINERALS) tablet Take 1 tablet by mouth daily.   Past Week at Unknown time  . omeprazole (PRILOSEC) 40 MG capsule Take 40 mg by mouth 2 (two) times daily.   07/01/2016 at 0800  . promethazine-codeine (PHENERGAN WITH CODEINE) 6.25-10 MG/5ML syrup Take 5 mLs  by mouth every 8 (eight) hours as needed for cough.   Past Week at Unknown time  . ramelteon (ROZEREM) 8 MG tablet Take 8 mg by mouth at bedtime.   07/01/2016 at 0800  . rosuvastatin (CRESTOR) 20 MG tablet Take 20 mg by mouth daily.   06/30/2016 at Unknown time  . sucralfate (CARAFATE) 1 G tablet Take 1 g by mouth 4 (four) times daily -  with meals and at bedtime.   07/01/2016 at 0800  . divalproex (DEPAKOTE ER) 250 MG 24 hr tablet Take 250 mg by mouth daily.   Past Week at Unknown time  . furosemide (LASIX) 20 MG tablet Take 20 mg by mouth daily.   Past Week at Unknown time  . HYDROcodone-homatropine (HYCODAN) 5-1.5 MG/5ML syrup Take 5 mLs by mouth every 6 (six)  hours as needed for cough.   Past Week at Unknown time  . metFORMIN (GLUCOPHAGE) 1000 MG tablet Take 1,000 mg by mouth 2 (two) times daily with a meal.   Past Week at Unknown time  . ondansetron (ZOFRAN ODT) 4 MG disintegrating tablet Take 1 tablet (4 mg total) by mouth every 8 (eight) hours as needed for nausea or vomiting. 20 tablet 0   . oxyCODONE-acetaminophen (ROXICET) 5-325 MG tablet Take 1 tablet by mouth every 4 (four) hours as needed for severe pain. 15 tablet 0 Past Week at Unknown time  . promethazine (PHENERGAN) 6.25 MG/5ML syrup Take 6.25 mg by mouth every 6 (six) hours as needed for nausea or vomiting.   Past Week at Unknown time     Allergies  Allergen Reactions  . Trazodone And Nefazodone   . Viread [Tenofovir Disoproxil]     Damages kidneys  . Etodolac Rash     Past Medical History:  Diagnosis Date  . Anemia   . Anxiety   . Chronic kidney disease    Renal Insufficiency Syndrome; Glomerulosclerosis 2013  . Complication of anesthesia   . Depressed   . Diabetes mellitus without complication ([place])   . High cholesterol   . HIV (human immunodeficiency virus infection) ([place])   . Hypertension   . 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 Paranoid disorder ([place])   . Schizophrenia, paranoid ([place])   . Sleep apnea     Review of systems:      Physical Exam    Heart and lungs: Regular rate and rhythm without rub or gallop, lungs are bilaterally clear.    HEENT: Normocephalic atraumatic eyes are anicteric    Other:     Pertinant exam for procedure: Soft nontender nondistended bowel sounds positive normoactive.    Planned proceedures: EGD and indicated procedures. I have discussed the risks benefits and complications of procedures to include not limited to bleeding, infection, perforation and the risk of sedation and the patient wishes to proceed. Outpatient short stay form Pre-procedure 07/01/2016 1:39 PM Danny Sails MD

## 2016-07-02 ENCOUNTER — Encounter: Payer: Self-pay | Admitting: Gastroenterology

## 2016-07-02 NOTE — Anesthesia Postprocedure Evaluation (Signed)
Anesthesia Post Note  Patient: Danny Cook.  Procedure(s) Performed: Procedure(s) (LRB): ESOPHAGOGASTRODUODENOSCOPY (EGD) WITH PROPOFOL (N/A)  Patient location during evaluation: Endoscopy Anesthesia Type: General Level of consciousness: awake and alert Pain management: pain level controlled Vital Signs Assessment: post-procedure vital signs reviewed and stable Respiratory status: spontaneous breathing, nonlabored ventilation, respiratory function stable and patient connected to nasal cannula oxygen Cardiovascular status: blood pressure returned to baseline and stable Postop Assessment: no signs of nausea or vomiting Anesthetic complications: no     Last Vitals:  Vitals:   07/01/16 1440 07/01/16 1450  BP: 135/87 (!) 143/95  Pulse: 65 71  Resp: 17 20  Temp:      Last Pain:  Vitals:   07/01/16 1420  TempSrc: Tympanic                 Precious Haws Mykell Rawl

## 2016-07-07 LAB — SURGICAL PATHOLOGY

## 2016-07-08 ENCOUNTER — Other Ambulatory Visit: Payer: Self-pay | Admitting: Gastroenterology

## 2016-07-08 DIAGNOSIS — R1312 Dysphagia, oropharyngeal phase: Secondary | ICD-10-CM

## 2016-07-28 ENCOUNTER — Ambulatory Visit
Admission: RE | Admit: 2016-07-28 | Discharge: 2016-07-28 | Disposition: A | Discharge status: Home / Self Care | Payer: Medicare Other | Source: Ambulatory Visit | Attending: Gastroenterology | Admitting: Gastroenterology

## 2016-07-28 DIAGNOSIS — R1312 Dysphagia, oropharyngeal phase: Secondary | ICD-10-CM | POA: Diagnosis present

## 2016-07-28 NOTE — Therapy (Signed)
[facility] [place], [place], [address] Phone: [phone]   Fax:     Modified Barium Swallow  Patient Details  Name: Asiah Reynen. MRN: [id number] Date of Birth: 29-Jul-1960 No Data Recorded  Encounter Date: 07/28/2016      End of Session - 07/28/16 1158    Visit Number 1   Number of Visits 1   Date for SLP Re-Evaluation 07/28/16   SLP Start Time 1120   SLP Stop Time  1158   SLP Time Calculation (min) 38 min   Activity Tolerance Patient tolerated treatment well      Past Medical History:  Diagnosis Date  . Anemia   . Anxiety   . Chronic kidney disease    Renal Insufficiency Syndrome; Glomerulosclerosis 2013  . Complication of anesthesia   . Depressed   . Diabetes mellitus without complication ([place])   . High cholesterol   . HIV (human immunodeficiency virus infection) ([place])   . Hypertension   . 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 Paranoid disorder ([place])   . Schizophrenia, paranoid ([place])   . Sleep apnea     Past Surgical History:  Procedure Laterality Date  . COLONOSCOPY WITH PROPOFOL N/A 09/03/2015   Procedure: COLONOSCOPY WITH PROPOFOL;  Surgeon: Lollie Sails, MD;  Location: [facility] ENDOSCOPY;  Service: Endoscopy;  Laterality: N/A;  . DG TEETH FULL    . ESOPHAGOGASTRODUODENOSCOPY (EGD) WITH PROPOFOL N/A 07/01/2016   Procedure: ESOPHAGOGASTRODUODENOSCOPY (EGD) WITH PROPOFOL;  Surgeon: Lollie Sails, MD;  Location[facility] ENDOSCOPY;  Service: Endoscopy;  Laterality: N/A;    There were no vitals filed for this visit.   Subjective: Patient behavior: (alertness, ability to follow instructions, etc.): The patient is able to express his swallowing complaints and follow directions. Chief complaint: dysphagia to solids.  Barium swallow February 2017 showed prominent cervical osteophytes and presbyesophagus.  Abnormal esophagus per upper GI endoscopy January 2018.   Objective:  Radiological  Procedure: A videoflouroscopic evaluation of oral-preparatory, reflex initiation, and pharyngeal phases of the swallow was performed; as well as a screening of the upper esophageal phase.  I. POSTURE: Upright in MBS chair  II. VIEW: Lateral  III. COMPENSATORY STRATEGIES: N/A  IV. BOLUSES ADMINISTERED:   Thin Liquid: 2 small sips, 3 rapid, consecutive moderate sips   Nectar-thick Liquid: 1 moderate sip    Puree: 2 teaspoon presentations   Mechanical Soft: 1/4 graham cracker in apple sauce  V. RESULTS OF EVALUATION: A. ORAL PREPARATORY PHASE: (The lips, tongue, and velum are observed for strength and coordination)       **Overall Severity Rating: Minimal; slightly disorganized oral phase with trace oral residue of solids post swallow  B. SWALLOW INITIATION/REFLEX: (The reflex is normal if "triggered" by the time the bolus reached the base of the tongue)  **Overall Severity Rating: Mild; triggers while falling from the valleculae to the pyriform sinuses  C. PHARYNGEAL PHASE: (Pharyngeal function is normal if the bolus shows rapid, smooth, and continuous transit through the pharynx and there is no pharyngeal residue after the swallow)  **Overall Severity Rating: Within normal limits  D. LARYNGEAL PENETRATION: (Material entering into the laryngeal inlet/vestibule but not aspirated): Transient, only during rapid, consecutive drinking   E. ASPIRATION:  None  F. ESOPHAGEAL PHASE: (Screening of the upper esophagus): cervical osteophytes do not impede flow of boluses tested.   ASSESSMENT: This 56 year old man, with dysphagia to solids, is presenting with mild oropharyngeal dysphagia.  Oral  control of the bolus including oral hold, rotary mastication, and anterior to posterior transfer are minimally disorganized with trace oral residue post swallow. Timing of the pharyngeal swallow is delayed, triggering while falling from the valleculae to the pyriform sinuses.  Aspects of the pharyngeal  stage of swallowing including tongue base retraction, hyolaryngeal excursion, epiglottic inversion, and duration/amplitude of UES opening are within normal limits.  There is no pharyngeal residue.  With rapid, consecutive drinking of thin liquid, there is transient laryngeal penetration.  There was no observed tracheal aspiration.  In the cervical esophagus there is impingement of an osteophyte.  However all boluses flow through the cervical esophagus.  An esophageal sweep was not remarkable.  The patient was counseled to take small bites, chew well, alternate liquids and solids, and modify problematic foods (soften/moisten as needed).  PLAN/RECOMMENDATIONS:   A. Diet: Regular (may want to soften and/or moisten for comfort, ease of swallowing)   B. Swallowing Precautions: small bites, chew well, alternate liquids and solids, and modify problematic foods (soften/moisten as needed).   C. Recommended consultation to: follow up with GI as recommended   D. Therapy recommendations: N/A   E. Results and recommendations were discussed with the patient and a caregiver immediately following the study and the final report routed to the referring practitioner.      Oropharyngeal dysphagia - Plan: DG OP Swallowing Func-Medicare/Speech Path, DG OP Swallowing Func-Medicare/Speech Path      G-Codes - 29-Jul-2016 1159    Functional Assessment Tool Used MBSS, clinical judgment   Functional Limitations Swallowing   Swallow Current Status [id number]) At least 20 percent but less than 40 percent impaired, limited or restricted   Swallow Goal Status [id number]) At least 20 percent but less than 40 percent impaired, limited or restricted   Swallow Discharge Status [phone]) At least 20 percent but less than 40 percent impaired, limited or restricted          Problem List There are no active problems to display for this patient.   Lou Miner 07-29-2016, 11:59 AM  [facility] [place], [place], [address] Phone: [phone]   Fax:     Name: Karin Pisano. MRN: [id number] Date of Birth: 1961/04/19

## 2016-10-27 ENCOUNTER — Emergency Department
Admission: EM | Admit: 2016-10-27 | Discharge: 2016-10-27 | Disposition: A | Discharge status: Home / Self Care | Payer: Medicare Other | Attending: Emergency Medicine | Admitting: Emergency Medicine

## 2016-10-27 DIAGNOSIS — N189 Chronic kidney disease, unspecified: Secondary | ICD-10-CM | POA: Insufficient documentation

## 2016-10-27 DIAGNOSIS — Z7984 Long term (current) use of oral hypoglycemic drugs: Secondary | ICD-10-CM | POA: Diagnosis not present

## 2016-10-27 DIAGNOSIS — E1165 Type 2 diabetes mellitus with hyperglycemia: Secondary | ICD-10-CM | POA: Insufficient documentation

## 2016-10-27 DIAGNOSIS — Z79899 Other long term (current) drug therapy: Secondary | ICD-10-CM | POA: Insufficient documentation

## 2016-10-27 DIAGNOSIS — Z7982 Long term (current) use of aspirin: Secondary | ICD-10-CM | POA: Insufficient documentation

## 2016-10-27 DIAGNOSIS — I129 Hypertensive chronic kidney disease with stage 1 through stage 4 chronic kidney disease, or unspecified chronic kidney disease: Secondary | ICD-10-CM | POA: Insufficient documentation

## 2016-10-27 DIAGNOSIS — R739 Hyperglycemia, unspecified: Secondary | ICD-10-CM

## 2016-10-27 DIAGNOSIS — Z21 Asymptomatic human immunodeficiency virus [HIV] infection status: Secondary | ICD-10-CM | POA: Diagnosis not present

## 2016-10-27 DIAGNOSIS — E1122 Type 2 diabetes mellitus with diabetic chronic kidney disease: Secondary | ICD-10-CM | POA: Insufficient documentation

## 2016-10-27 LAB — CBC WITH DIFFERENTIAL/PLATELET
Basophils Absolute: 0 10*3/uL (ref 0–0.1)
Basophils Relative: 1 %
Eosinophils Absolute: 0.2 10*3/uL (ref 0–0.7)
Eosinophils Relative: 3 %
HEMATOCRIT: 36.3 % — AB (ref 40.0–52.0)
HEMOGLOBIN: 12.6 g/dL — AB (ref 13.0–18.0)
LYMPHS ABS: 2.4 10*3/uL (ref 1.0–3.6)
LYMPHS PCT: 42 %
MCH: 31.2 pg (ref 26.0–34.0)
MCHC: 34.6 g/dL (ref 32.0–36.0)
MCV: 90.2 fL (ref 80.0–100.0)
MONO ABS: 0.5 10*3/uL (ref 0.2–1.0)
MONOS PCT: 9 %
NEUTROS ABS: 2.6 10*3/uL (ref 1.4–6.5)
NEUTROS PCT: 45 %
Platelets: 211 10*3/uL (ref 150–440)
RBC: 4.03 MIL/uL — ABNORMAL LOW (ref 4.40–5.90)
RDW: 14.9 % — AB (ref 11.5–14.5)
WBC: 5.7 10*3/uL (ref 3.8–10.6)

## 2016-10-27 LAB — HEPATIC FUNCTION PANEL
ALBUMIN: 3.5 g/dL (ref 3.5–5.0)
ALT: 29 U/L (ref 17–63)
AST: 32 U/L (ref 15–41)
Alkaline Phosphatase: 89 U/L (ref 38–126)
TOTAL PROTEIN: 6.9 g/dL (ref 6.5–8.1)
Total Bilirubin: 0.6 mg/dL (ref 0.3–1.2)

## 2016-10-27 LAB — BASIC METABOLIC PANEL
ANION GAP: 11 (ref 5–15)
BUN: 14 mg/dL (ref 6–20)
CO2: 24 mmol/L (ref 22–32)
Calcium: 9 mg/dL (ref 8.9–10.3)
Chloride: 105 mmol/L (ref 101–111)
Creatinine, Ser: 1.5 mg/dL — ABNORMAL HIGH (ref 0.61–1.24)
GFR, EST AFRICAN AMERICAN: 58 mL/min — AB (ref >60)
GFR, EST NON AFRICAN AMERICAN: 50 mL/min — AB (ref >60)
GLUCOSE: 317 mg/dL — AB (ref 65–99)
POTASSIUM: 4.4 mmol/L (ref 3.5–5.1)
Sodium: 140 mmol/L (ref 135–145)

## 2016-10-27 LAB — GLUCOSE, CAPILLARY
GLUCOSE-CAPILLARY: 356 mg/dL — AB (ref 65–99)
Glucose-Capillary: 211 mg/dL — ABNORMAL HIGH (ref 65–99)

## 2016-10-27 LAB — BETA-HYDROXYBUTYRIC ACID: Beta-Hydroxybutyric Acid: 0.18 mmol/L (ref 0.05–0.27)

## 2016-10-27 MED ORDER — SODIUM CHLORIDE 0.9 % IV BOLUS (SEPSIS)
1000.0000 mL | Freq: Once | INTRAVENOUS | Status: AC
  Administered 2016-10-27: 1000 mL via INTRAVENOUS

## 2016-10-27 NOTE — Discharge Instructions (Signed)
Please make an appointment to see her primary care physician within the next 2-3 days for recheck of your blood sugar. Continue taking all of your medications as prescribed and return to the emergency department for any new or worsening symptoms such as chest pain, shortness of breath, or for any other concerns.  It was a pleasure to take care of you today, and thank you for coming to our emergency department.  If you have any questions or concerns before leaving please ask the nurse to grab me and I'm more than happy to go through your aftercare instructions again.  If you were prescribed any opioid pain medication today such as Norco, Vicodin, Percocet, morphine, hydrocodone, or oxycodone please make sure you do not drive when you are taking this medication as it can alter your ability to drive safely.  If you have any concerns once you are home that you are not improving or are in fact getting worse before you can make it to your follow-up appointment, please do not hesitate to call 911 and come back for further evaluation.  Darel Hong MD  Results for orders placed or performed during the hospital encounter of 54/62/70  Basic metabolic panel  Result Value Ref Range   Sodium 140 135 - 145 mmol/L   Potassium 4.4 3.5 - 5.1 mmol/L   Chloride 105 101 - 111 mmol/L   CO2 24 22 - 32 mmol/L   Glucose, Bld 317 (H) 65 - 99 mg/dL   BUN 14 6 - 20 mg/dL   Creatinine, Ser 1.50 (H) 0.61 - 1.24 mg/dL   Calcium 9.0 8.9 - 10.3 mg/dL   GFR calc non Af Amer 50 (L) >60 mL/min   GFR calc Af Amer 58 (L) >60 mL/min   Anion gap 11 5 - 15  Hepatic function panel  Result Value Ref Range   Total Protein 6.9 6.5 - 8.1 g/dL   Albumin 3.5 3.5 - 5.0 g/dL   AST 32 15 - 41 U/L   ALT 29 17 - 63 U/L   Alkaline Phosphatase 89 38 - 126 U/L   Total Bilirubin 0.6 0.3 - 1.2 mg/dL   Bilirubin, Direct <0.1 (L) 0.1 - 0.5 mg/dL   Indirect Bilirubin NOT CALCULATED 0.3 - 0.9 mg/dL  CBC with Differential  Result Value Ref  Range   WBC 5.7 3.8 - 10.6 K/uL   RBC 4.03 (L) 4.40 - 5.90 MIL/uL   Hemoglobin 12.6 (L) 13.0 - 18.0 g/dL   HCT 36.3 (L) 40.0 - 52.0 %   MCV 90.2 80.0 - 100.0 fL   MCH 31.2 26.0 - 34.0 pg   MCHC 34.6 32.0 - 36.0 g/dL   RDW 14.9 (H) 11.5 - 14.5 %   Platelets 211 150 - 440 K/uL   Neutrophils Relative % 45 %   Neutro Abs 2.6 1.4 - 6.5 K/uL   Lymphocytes Relative 42 %   Lymphs Abs 2.4 1.0 - 3.6 K/uL   Monocytes Relative 9 %   Monocytes Absolute 0.5 0.2 - 1.0 K/uL   Eosinophils Relative 3 %   Eosinophils Absolute 0.2 0 - 0.7 K/uL   Basophils Relative 1 %   Basophils Absolute 0.0 0 - 0.1 K/uL  Beta-hydroxybutyric acid  Result Value Ref Range   Beta-Hydroxybutyric Acid 0.18 0.05 - 0.27 mmol/L  Blood gas, venous  Result Value Ref Range   FIO2 PENDING    pH, Ven 7.35 7.250 - 7.430   pCO2, Ven 39 (L) 44.0 - 60.0 mmHg  pO2, Ven PENDING 32.0 - 45.0 mmHg   Bicarbonate 21.5 20.0 - 28.0 mmol/L   Acid-base deficit 3.8 (H) 0.0 - 2.0 mmol/L   O2 Saturation 55.6 %   Patient temperature 37.0    Collection site VEIN    Sample type VEIN    Mechanical Rate PENDING   Glucose, capillary  Result Value Ref Range   Glucose-Capillary 356 (H) 65 - 99 mg/dL

## 2016-10-27 NOTE — ED Provider Notes (Signed)
[facility] Emergency Department Provider Note  ____________________________________________   First MD Initiated Contact with Patient 10/27/16 1834     (approximate)  I have reviewed the triage vital signs and the nursing notes.   HISTORY  Chief Complaint Hyperglycemia    HPI Danny Cook. is a 56 y.o. male who comes to the emergency department via EMS for critical high blood sugar at his home. He has a past medical history of type 2 diabetes mellitus, HIV with a last known CD4 count of 800, chronic kidney disease who has felt generally well although somewhat tired the last several weeks. 2 weeks ago he began taking amitriptyline to help him sleep and he has no other recent changes in his medications. Today he checked his blood sugar at home and noted it was critically high so she called 911. When EMS arrived his sugar was in the 380s. He's had no fevers or chills. No chest pain or shortness of breath. No abdominal pain nausea or vomiting.   Past Medical History:  Diagnosis Date  . Anemia   . Anxiety   . Chronic kidney disease    Renal Insufficiency Syndrome; Glomerulosclerosis 2013  . Complication of anesthesia   . Depressed   . Diabetes mellitus without complication ([place])   . High cholesterol   . HIV (human immunodeficiency virus infection) ([place])   . Hypertension   . 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 Paranoid disorder ([place])   . Schizophrenia, paranoid ([place])   . Sleep apnea     There are no active problems to display for this patient.   Past Surgical History:  Procedure Laterality Date  . COLONOSCOPY WITH PROPOFOL N/A 09/03/2015   Procedure: COLONOSCOPY WITH PROPOFOL;  Surgeon: Lollie Sails, MD;  Location: [facility] ENDOSCOPY;  Service: Endoscopy;  Laterality: N/A;  . DG TEETH FULL    . ESOPHAGOGASTRODUODENOSCOPY (EGD) WITH PROPOFOL N/A 07/01/2016   Procedure: ESOPHAGOGASTRODUODENOSCOPY (EGD) WITH PROPOFOL;  Surgeon: Lollie Sails, MD;   Location: [facility] ENDOSCOPY;  Service: Endoscopy;  Laterality: N/A;    Prior to Admission medications   Medication Sig Start Date End Date Taking? Authorizing Provider  Abacavir-Dolutegravir-Lamivud (TRIUMEQ) 600-50-300 MG TABS Take 1 tablet by mouth daily.    Historical Provider, MD  aspirin 325 MG tablet Take 325 mg by mouth daily.    Historical Provider, MD  Brexpiprazole 2 MG TABS Take 1 tablet by mouth daily.    Historical Provider, MD  Choline Fenofibrate 45 MG capsule Take 45 mg by mouth daily.    Historical Provider, MD  clonazePAM (KLONOPIN) 2 MG tablet Take 2 mg by mouth 2 (two) times daily.    Historical Provider, MD  clorazepate (TRANXENE) 15 MG tablet Take 15 mg by mouth at bedtime.    Historical Provider, MD  Cyanocobalamin (VITAMIN B 12 PO) Take 1,000 mcg by mouth daily.    Historical Provider, MD  divalproex (DEPAKOTE ER) 250 MG 24 hr tablet Take 250 mg by mouth daily.    Historical Provider, MD  divalproex (DEPAKOTE ER) 250 MG 24 hr tablet Take 500 mg by mouth at bedtime.    Historical Provider, MD  ferrous sulfate 325 (65 FE) MG tablet Take 325 mg by mouth daily with breakfast.    Historical Provider, MD  furosemide (LASIX) 20 MG tablet Take 20 mg by mouth daily.    Historical Provider, MD  gabapentin (NEURONTIN) 100 MG capsule Take 100 mg by mouth 3 (three) times daily.  Historical Provider, MD  glimepiride (AMARYL) 4 MG tablet Take 8 mg by mouth daily with breakfast.    Historical Provider, MD  HYDROcodone-homatropine (HYCODAN) 5-1.5 MG/5ML syrup Take 5 mLs by mouth every 6 (six) hours as needed for cough.    Historical Provider, MD  ibuprofen (ADVIL,MOTRIN) 800 MG tablet Take 1 tablet (800 mg total) by mouth every 8 (eight) hours as needed for moderate pain. 05/27/15   Paulette Blanch, MD  iloperidone (FANAPT) 4 MG TABS tablet Take 4 mg by mouth 2 (two) times daily.    Historical Provider, MD  lisinopril (PRINIVIL,ZESTRIL) 20 MG tablet Take 20 mg by mouth daily.    Historical  Provider, MD  meclizine (ANTIVERT) 25 MG tablet Take 1 tablet (25 mg total) by mouth 3 (three) times daily as needed for dizziness or nausea. 04/12/16   Carrie Mew, MD  metFORMIN (GLUCOPHAGE) 1000 MG tablet Take 1,000 mg by mouth 2 (two) times daily with a meal.    Historical Provider, MD  mirtazapine (REMERON) 30 MG tablet Take 30 mg by mouth at bedtime.    Historical Provider, MD  Multiple Vitamins-Minerals (MULTIVITAMIN WITH MINERALS) tablet Take 1 tablet by mouth daily.    Historical Provider, MD  omeprazole (PRILOSEC) 40 MG capsule Take 40 mg by mouth 2 (two) times daily.    Historical Provider, MD  ondansetron (ZOFRAN ODT) 4 MG disintegrating tablet Take 1 tablet (4 mg total) by mouth every 8 (eight) hours as needed for nausea or vomiting. 04/12/16   Carrie Mew, MD  oxyCODONE-acetaminophen (ROXICET) 5-325 MG tablet Take 1 tablet by mouth every 4 (four) hours as needed for severe pain. 05/27/15   Paulette Blanch, MD  promethazine (PHENERGAN) 6.25 MG/5ML syrup Take 6.25 mg by mouth every 6 (six) hours as needed for nausea or vomiting.    Historical Provider, MD  promethazine-codeine (PHENERGAN WITH CODEINE) 6.25-10 MG/5ML syrup Take 5 mLs by mouth every 8 (eight) hours as needed for cough.    Historical Provider, MD  ramelteon (ROZEREM) 8 MG tablet Take 8 mg by mouth at bedtime.    Historical Provider, MD  rosuvastatin (CRESTOR) 20 MG tablet Take 20 mg by mouth daily.    Historical Provider, MD  sucralfate (CARAFATE) 1 G tablet Take 1 g by mouth 4 (four) times daily -  with meals and at bedtime.    Historical Provider, MD    Allergies Trazodone and nefazodone; Viread [tenofovir disoproxil]; and Etodolac  Family History  Problem Relation Age of Onset  . Heart attack Mother   . Diabetes Mellitus II Mother   . Mental illness Mother   . CAD Mother   . Heart attack Father   . CAD Father   . Hypertension Father     Social History Social History  Substance Use Topics  . Smoking  status: Never Smoker  . Smokeless tobacco: Never Used  . Alcohol use No    Review of Systems Constitutional: No fever/chills Eyes: No visual changes. ENT: No sore throat. Cardiovascular: Denies chest pain. Respiratory: Denies shortness of breath. Gastrointestinal: No abdominal pain.  No nausea, no vomiting.  No diarrhea.  No constipation. Genitourinary: Negative for dysuria. Musculoskeletal: Negative for back pain. Skin: Negative for rash. Neurological: Negative for headaches, focal weakness or numbness.  10-point ROS otherwise negative.  ____________________________________________   PHYSICAL EXAM:  VITAL SIGNS: ED Triage Vitals  Enc Vitals Group     BP      Pulse      Resp  Temp      Temp src      SpO2      Weight      Height      Head Circumference      Peak Flow      Pain Score      Pain Loc      Pain Edu?      Excl. in [place]?     Constitutional: Alert and oriented x 4 well appearing nontoxic no diaphoresis speaks in full, clear sentences Eyes: PERRL EOMI. Head: Atraumatic. Nose: No congestion/rhinnorhea. Mouth/Throat: No trismus Neck: No stridor.   Cardiovascular: Normal rate, regular rhythm. Grossly normal heart sounds.  Good peripheral circulation. Respiratory: Normal respiratory effort.  No retractions. Lungs CTAB and moving good air Gastrointestinal: Soft nondistended nontender no rebound or guarding no peritonitis Musculoskeletal: No lower extremity edema   Neurologic:  Normal speech and language. No gross focal neurologic deficits are appreciated. Skin:  Skin is warm, dry and intact. No rash noted. Psychiatric: Mood and affect are normal. Speech and behavior are normal.    ____________________________________________   DIFFERENTIAL  HHS, DKA, asymptomatic hyperglycemia, medication noncompliance, medication interaction, ____________________________________________   LABS (all labs ordered are listed, but only abnormal results are  displayed)  Labs Reviewed  BASIC METABOLIC PANEL - Abnormal; Notable for the following:       Result Value   Glucose, Bld 317 (*)    Creatinine, Ser 1.50 (*)    GFR calc non Af Amer 50 (*)    GFR calc Af Amer 58 (*)    All other components within normal limits  HEPATIC FUNCTION PANEL - Abnormal; Notable for the following:    Bilirubin, Direct <0.1 (*)    All other components within normal limits  CBC WITH DIFFERENTIAL/PLATELET - Abnormal; Notable for the following:    RBC 4.03 (*)    Hemoglobin 12.6 (*)    HCT 36.3 (*)    RDW 14.9 (*)    All other components within normal limits  BLOOD GAS, VENOUS - Abnormal; Notable for the following:    pCO2, Ven 39 (*)    Acid-base deficit 3.8 (*)    All other components within normal limits  GLUCOSE, CAPILLARY - Abnormal; Notable for the following:    Glucose-Capillary 356 (*)    All other components within normal limits  GLUCOSE, CAPILLARY - Abnormal; Notable for the following:    Glucose-Capillary 211 (*)    All other components within normal limits  BETA-HYDROXYBUTYRIC ACID    Slightly elevated blood sugar with normal anion gap and normal beta hydroxybutyric acid __________________________________________  EKG   ____________________________________________  RADIOLOGY   ____________________________________________   PROCEDURES  Procedure(s) performed: no  Procedures  Critical Care performed: no  Observation: no ____________________________________________   INITIAL IMPRESSION / ASSESSMENT AND PLAN / ED COURSE  Pertinent labs & imaging results that were available during my care of the patient were reviewed by me and considered in my medical decision making (see chart for details).  The patient arrives well-appearing and hemodynamically stable with no ketones on his breath and no clue small breathing. Doubt DKA. At this point differential for hyperglycemia is broad so labs are pending.    The patient's labs show  an elevated blood glucose but no signs of DKA or HHS. After some hydration and he feels significantly improved. At this point I'm comfortable having the patient follow up with his primary care physician later on this week for recheck. He is discharged  home in improved condition.  ____________________________________________   FINAL CLINICAL IMPRESSION(S) / ED DIAGNOSES  Final diagnoses:  Hyperglycemia      NEW MEDICATIONS STARTED DURING THIS VISIT:  Discharge Medication List as of 10/27/2016  8:03 PM       Note:  This document was prepared using Dragon voice recognition software and may include unintentional dictation errors.     Darel Hong, MD 10/28/16 [phone]

## 2016-10-28 LAB — BLOOD GAS, VENOUS
ACID-BASE DEFICIT: 3.8 mmol/L — AB (ref 0.0–2.0)
BICARBONATE: 21.5 mmol/L (ref 20.0–28.0)
O2 Saturation: 55.6 %
PATIENT TEMPERATURE: 37
pCO2, Ven: 39 mmHg — ABNORMAL LOW (ref 44.0–60.0)
pH, Ven: 7.35 (ref 7.250–7.430)

## 2016-11-16 ENCOUNTER — Emergency Department
Admission: EM | Admit: 2016-11-16 | Discharge: 2016-11-16 | Disposition: A | Discharge status: Home / Self Care | Payer: Medicare Other | Attending: Emergency Medicine | Admitting: Emergency Medicine

## 2016-11-16 ENCOUNTER — Encounter: Payer: Self-pay | Admitting: Emergency Medicine

## 2016-11-16 DIAGNOSIS — Y939 Activity, unspecified: Secondary | ICD-10-CM | POA: Insufficient documentation

## 2016-11-16 DIAGNOSIS — Z7984 Long term (current) use of oral hypoglycemic drugs: Secondary | ICD-10-CM | POA: Diagnosis not present

## 2016-11-16 DIAGNOSIS — Y999 Unspecified external cause status: Secondary | ICD-10-CM | POA: Diagnosis not present

## 2016-11-16 DIAGNOSIS — L299 Pruritus, unspecified: Secondary | ICD-10-CM | POA: Diagnosis not present

## 2016-11-16 DIAGNOSIS — E1122 Type 2 diabetes mellitus with diabetic chronic kidney disease: Secondary | ICD-10-CM | POA: Diagnosis not present

## 2016-11-16 DIAGNOSIS — N189 Chronic kidney disease, unspecified: Secondary | ICD-10-CM | POA: Diagnosis not present

## 2016-11-16 DIAGNOSIS — Y92129 Unspecified place in nursing home as the place of occurrence of the external cause: Secondary | ICD-10-CM | POA: Diagnosis not present

## 2016-11-16 DIAGNOSIS — Z79899 Other long term (current) drug therapy: Secondary | ICD-10-CM | POA: Diagnosis not present

## 2016-11-16 DIAGNOSIS — W57XXXA Bitten or stung by nonvenomous insect and other nonvenomous arthropods, initial encounter: Secondary | ICD-10-CM | POA: Insufficient documentation

## 2016-11-16 DIAGNOSIS — S40862A Insect bite (nonvenomous) of left upper arm, initial encounter: Secondary | ICD-10-CM | POA: Insufficient documentation

## 2016-11-16 DIAGNOSIS — B20 Human immunodeficiency virus [HIV] disease: Secondary | ICD-10-CM | POA: Diagnosis not present

## 2016-11-16 DIAGNOSIS — S40861A Insect bite (nonvenomous) of right upper arm, initial encounter: Secondary | ICD-10-CM | POA: Diagnosis not present

## 2016-11-16 DIAGNOSIS — Z7982 Long term (current) use of aspirin: Secondary | ICD-10-CM | POA: Diagnosis not present

## 2016-11-16 MED ORDER — TRIAMCINOLONE ACETONIDE 0.1 % EX CREA
TOPICAL_CREAM | Freq: Two times a day (BID) | CUTANEOUS | Status: DC | PRN
  Filled 2016-11-16: qty 15

## 2016-11-16 MED ORDER — ACYCLOVIR 400 MG PO TABS: 400.0000 mg | ORAL_TABLET | Freq: Three times a day (TID) | ORAL | 0 refills | Status: DC

## 2016-11-16 MED ORDER — TRIAMCINOLONE ACETONIDE 0.1 % EX CREA: 1.0000 "application " | TOPICAL_CREAM | Freq: Two times a day (BID) | CUTANEOUS | 0 refills | Status: DC | PRN

## 2016-11-16 NOTE — ED Triage Notes (Signed)
Patient reports that he has a bug bite to his left hand and that he had one to his neck that is causing itching.

## 2016-11-16 NOTE — ED Provider Notes (Signed)
[facility] Provider Note   CSN: [id number] Arrival date & time: 11/16/16  1931     History   Chief Complaint Chief Complaint  Patient presents with  . Insect Bite    HPI Danny Cook is a 56 y.o. male presents with several areas on the right and left upper extremities that appears to be bug or insect type bites. Patient endorses the areas feel "itchy." He has attempted to self treat with soap and alcohol. Patient patient lives in a group home and denies anyone else having similar insect like bites. He denies seeing any bugs or insects in his living space or clothing.   HPI  Past Medical History:  Diagnosis Date  . Anemia   . Anxiety   . Chronic kidney disease    Renal Insufficiency Syndrome; Glomerulosclerosis 2013  . Complication of anesthesia   . Depressed   . Diabetes mellitus without complication ([place])   . High cholesterol   . HIV (human immunodeficiency virus infection) ([place])   . Hypertension   . 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 Paranoid disorder ([place])   . Schizophrenia, paranoid ([place])   . Sleep apnea     There are no active problems to display for this patient.   Past Surgical History:  Procedure Laterality Date  . COLONOSCOPY WITH PROPOFOL N/A 09/03/2015   Procedure: COLONOSCOPY WITH PROPOFOL;  Surgeon: Lollie Sails, MD;  Location: [facility] ENDOSCOPY;  Service: Endoscopy;  Laterality: N/A;  . DG TEETH FULL    . ESOPHAGOGASTRODUODENOSCOPY (EGD) WITH PROPOFOL N/A 07/01/2016   Procedure: ESOPHAGOGASTRODUODENOSCOPY (EGD) WITH PROPOFOL;  Surgeon: Lollie Sails, MD;  Location: [facility] ENDOSCOPY;  Service: Endoscopy;  Laterality: N/A;       Home Medications    Prior to Admission medications   Medication Sig Start Date End Date Taking? Authorizing Provider  Abacavir-Dolutegravir-Lamivud (TRIUMEQ) 600-50-300 MG TABS Take 1 tablet by mouth daily.    [provider]  aspirin 325 MG tablet Take 325 mg by mouth daily.    [provider]  Brexpiprazole 2 MG TABS Take 1 tablet by mouth daily.    [provider]  Choline Fenofibrate 45 MG capsule Take 45 mg by mouth daily.    [provider]  clonazePAM (KLONOPIN) 2 MG tablet Take 2 mg by mouth 2 (two) times daily.    [provider]  clorazepate (TRANXENE) 15 MG tablet Take 15 mg by mouth at bedtime.    [provider]  Cyanocobalamin (VITAMIN B 12 PO) Take 1,000 mcg by mouth daily.    [provider]  divalproex (DEPAKOTE ER) 250 MG 24 hr tablet Take 250 mg by mouth daily.    [provider]  divalproex (DEPAKOTE ER) 250 MG 24 hr tablet Take 500 mg by mouth at bedtime.    [provider]  ferrous sulfate 325 (65 FE) MG tablet Take 325 mg by mouth daily with breakfast.    [provider]  furosemide (LASIX) 20 MG tablet Take 20 mg by mouth daily.    [provider]  gabapentin (NEURONTIN) 100 MG capsule Take 100 mg by mouth 3 (three) times daily.    [provider]  glimepiride (AMARYL) 4 MG tablet Take 8 mg by mouth daily with breakfast.    [provider]  HYDROcodone-homatropine (HYCODAN) 5-1.5 MG/5ML syrup Take 5 mLs by mouth every 6 (six) hours as needed for cough.    [provider]  ibuprofen (ADVIL,MOTRIN) 800 MG  tablet Take 1 tablet (800 mg total) by mouth every 8 (eight) hours as needed for moderate pain. 05/27/15   Paulette Blanch, MD  iloperidone (FANAPT) 4 MG TABS tablet Take 4 mg by mouth 2 (two) times daily.    [provider]  lisinopril (PRINIVIL,ZESTRIL) 20 MG tablet Take 20 mg by mouth daily.    [provider]  meclizine (ANTIVERT) 25 MG tablet Take 1 tablet (25 mg total) by mouth 3 (three) times daily as needed for dizziness or nausea. 04/12/16   Carrie Mew, MD  metFORMIN (GLUCOPHAGE) 1000 MG tablet Take 1,000 mg by mouth 2 (two) times daily with a meal.    [provider]  mirtazapine (REMERON) 30 MG tablet  Take 30 mg by mouth at bedtime.    [provider]  Multiple Vitamins-Minerals (MULTIVITAMIN WITH MINERALS) tablet Take 1 tablet by mouth daily.    [provider]  omeprazole (PRILOSEC) 40 MG capsule Take 40 mg by mouth 2 (two) times daily.    [provider]  ondansetron (ZOFRAN ODT) 4 MG disintegrating tablet Take 1 tablet (4 mg total) by mouth every 8 (eight) hours as needed for nausea or vomiting. 04/12/16   Carrie Mew, MD  oxyCODONE-acetaminophen (ROXICET) 5-325 MG tablet Take 1 tablet by mouth every 4 (four) hours as needed for severe pain. 05/27/15   Paulette Blanch, MD  promethazine (PHENERGAN) 6.25 MG/5ML syrup Take 6.25 mg by mouth every 6 (six) hours as needed for nausea or vomiting.    [provider]  promethazine-codeine (PHENERGAN WITH CODEINE) 6.25-10 MG/5ML syrup Take 5 mLs by mouth every 8 (eight) hours as needed for cough.    [provider]  ramelteon (ROZEREM) 8 MG tablet Take 8 mg by mouth at bedtime.    [provider]  rosuvastatin (CRESTOR) 20 MG tablet Take 20 mg by mouth daily.    [provider]  sucralfate (CARAFATE) 1 G tablet Take 1 g by mouth 4 (four) times daily -  with meals and at bedtime.    [provider]  triamcinolone cream (KENALOG) 0.1 % Apply 1 application topically 2 (two) times daily as needed. 11/16/16   Olie Dibert, Laroy Apple, PA-C    Family History Family History  Problem Relation Age of Onset  . Heart attack Mother   . Diabetes Mellitus II Mother   . Mental illness Mother   . CAD Mother   . Heart attack Father   . CAD Father   . Hypertension Father     Social History Social History  Substance Use Topics  . Smoking status: Never Smoker  . Smokeless tobacco: Never Used  . Alcohol use No     Allergies   Trazodone and nefazodone; Viread [tenofovir disoproxil]; and Etodolac   Review of Systems Review of Systems  Constitutional: Negative for chills and fever.    Respiratory: Negative for shortness of breath.   Cardiovascular: Negative for chest pain.  Skin: Positive for rash.     Physical Exam Updated Vital Signs BP 123/78 (BP Location: Left Arm)   Pulse 89   Temp 98 F (36.7 C) (Oral)   Resp 18   Ht 5\' 7"  (1.702 m)   Wt 217 lb (98.4 kg)   SpO2 96%   BMI 33.99 kg/m   Physical Exam  Constitutional: He appears well-developed and well-nourished.  Eyes: Conjunctivae are normal.  Cardiovascular: Normal rate.   Pulmonary/Chest: Breath sounds normal.  Musculoskeletal: Normal range of motion.  Skin:  Skin is warm and dry.  Bilateral upper extremities: several bug like bites, varying stages of healing. Pruritic in nature. Associated per patient during assessment with possibility of bug or insects present however he is unable to fully confirm.      ED Treatments / Results  Labs (all labs ordered are listed, but only abnormal results are displayed) Labs Reviewed - No data to display  EKG  EKG Interpretation None       Radiology No results found.  Procedures Procedures (including critical care time)  none  Medications Ordered in ED Medications  triamcinolone cream (KENALOG) 0.1 % (not administered)     Initial Impression / Assessment and Plan / ED Course  I have reviewed the triage vital signs and the nursing notes.  Pertinent labs & imaging results that were available during my care of the patient were reviewed by me and considered in my medical decision making (see chart for details).    Patient presents with what appears to be insect or bug bites likely bedbug bites. Patient described seeing possibly a few insects in his laundry however unable to confirm what the insects were. Physical exam findings are reassuring that there is no sign of infection. Patient will be prescribed Triamcinolone for symptoms management. Encourage patient to monitor affected areas and follow up with primary care as needed and return to emergency  department if symptoms worsen.  Final Clinical Impressions(s) / ED Diagnoses   Final diagnoses:  Insect bite, initial encounter  Pruritic condition    New Prescriptions Discharge Medication List as of 11/16/2016  9:12 PM    START taking these medications   Details  triamcinolone cream (KENALOG) 0.1 % Apply 1 application topically 2 (two) times daily as needed., Starting Sun 11/16/2016, Print         Radley Barto M, PA-C 11/16/16 2301    Carrie Mew, MD 11/17/16 2205

## 2016-12-16 ENCOUNTER — Encounter: Payer: Medicare Other | Attending: Internal Medicine | Admitting: Dietician

## 2016-12-16 ENCOUNTER — Encounter: Payer: Self-pay | Admitting: Dietician

## 2016-12-16 VITALS — BP 116/78 | Ht 67.0 in | Wt 214.9 lb

## 2016-12-16 DIAGNOSIS — E119 Type 2 diabetes mellitus without complications: Secondary | ICD-10-CM | POA: Diagnosis present

## 2016-12-16 DIAGNOSIS — Z713 Dietary counseling and surveillance: Secondary | ICD-10-CM | POA: Diagnosis not present

## 2016-12-16 NOTE — Progress Notes (Signed)
Diabetes Self-Management Education  Visit Type: First/Initial  Appt. Start Time: 1200 Appt. End Time: 9767  12/16/2016  [name], identified by name and date of birth, is a 56 y.o. male with a diagnosis of Diabetes: Type 2.   ASSESSMENT  Blood pressure 116/78, height 5\' 7"  (1.702 m), weight 214 lb 14.4 oz (97.5 kg). Body mass index is 33.66 kg/m.      Diabetes Self-Management Education - 12/16/16 1500      Visit Information   Visit Type First/Initial     Initial Visit   Diabetes Type Type 2     Health Coping   How would you rate your overall health? Fair     Psychosocial Assessment   Patient Belief/Attitude about Diabetes Motivated to manage diabetes   Self-care barriers Unable to determine   Self-management support Doctor's office  pt lives at [facility] home   Other persons present Patient   Patient Concerns Glycemic Control;Medication;Weight Control;Healthy Lifestyle  prevent complications   Special Needs None   Preferred Learning Style Auditory   Learning Readiness Ready   What is the last grade level you completed in school? GED     Pre-Education Assessment   Patient understands the diabetes disease and treatment process. Needs Review   Patient understands incorporating nutritional management into lifestyle. Needs Review   Patient undertands incorporating physical activity into lifestyle. Needs Review   Patient understands using medications safely. Needs Review   Patient understands monitoring blood glucose, interpreting and using results Needs Review-pt checks BG's 2x/day with recent results 120's-200's   Patient understands prevention, detection, and treatment of acute complications. Needs Review   Patient understands prevention, detection, and treatment of chronic complications. Needs Review   Patient understands how to develop strategies to address psychosocial issues. Needs Review   Patient understands how to develop strategies to promote  health/change behavior. Needs Review     Complications   How often do you check your blood sugar? 1-2 times/day  checks  BG's at family home -results 120's-200's; per pt recent A1C 8+%   Have you had a dilated eye exam in the past 12 months? Yes  early 2018   Have you had a dental exam in the past 12 months? No  has no teeth   Are you checking your feet? No     Dietary Intake   Breakfast --  eats breakfast at 8a-8:15a   Snack (morning) --  occasionally eats chips or sweets   Lunch --  eats lunch 11:45a-12p; eats sweets and fried foods 2-3x/wk    Snack (afternoon) --  occasionally eats chips or sweets   Dinner --  eats supper at [employer] (evening) --  none before bedtime which is 8:30p; wakes up at 3a and often eats meat sandwich during night    Beverage(s) --  drinks water 4-5x/day, regular sodas 2-3x/day, artificially sweetened beverages 2-3x/day and occasional milk with cereal for breakfast     Exercise   Exercise Type ADL's;Light (walking / raking leaves)  walks 30-45 min 2-3x/wk     Patient Education   Previous Diabetes Education Yes (please comment)   Disease state  Definition of diabetes, type 1 and 2, and the diagnosis of diabetes;Explored patient's options for treatment of their diabetes   Nutrition management  Role of diet in the treatment of diabetes and the relationship between the three main macronutrients and blood glucose level;Food label reading, portion sizes and measuring food.;Carbohydrate counting   Physical activity  and exercise  Role of exercise on diabetes management, blood pressure control and cardiac health.;Helped patient identify appropriate exercises in relation to his/her diabetes, diabetes complications and other health issue.   Medications Taught/reviewed insulin injection, site rotation, insulin storage and needle disposal.;Reviewed patients medication for diabetes, action, purpose, timing of dose and side effects.   Monitoring Purpose and  frequency of SMBG.;Taught/discussed recording of test results and interpretation of SMBG.;Identified appropriate SMBG and/or A1C goals.;Daily foot exams;Yearly dilated eye exam;Interpreting lab values - A1C, lipid, urine microalbumina.   Acute complications Taught treatment of hypoglycemia - the 15 rule.   Chronic complications Relationship between chronic complications and blood glucose control;Assessed and discussed foot care and prevention of foot problems;Lipid levels, blood glucose control and heart disease;Identified and discussed with patient  current chronic complications;Dental care;Retinopathy and reason for yearly dilated eye exams;Nephropathy, what it is, prevention of, the use of ACE, ARB's and early detection of through urine microalbumia.;Reviewed with patient heart disease, higher risk of, and prevention   Psychosocial adjustment Role of stress on diabetes   Personal strategies to promote health Lifestyle issues that need to be addressed for better diabetes care;Helped patient develop diabetes management plan for (enter comment)     Outcomes   Expected Outcomes Demonstrated interest in learning. Expect positive outcomes      Individualized Plan for Diabetes Self-Management Training:   Learning Objective:  Patient will have a greater understanding of diabetes self-management. Patient education plan is to attend individual and/or group sessions per assessed needs and concerns.   Plan:   Patient Instructions   Check blood sugars 2 x day before breakfast and 2 hrs after supper every day Bring blood sugar records to the next appointment/class Exercise: walk  for    30  minutes  4 days a week Eat 3 meals day,   2  snacks a day in afternoon and at bedtime Space meals 4-5 hours apart Eat 3 carbohydrate servings/meal + protein Eat 1 carbohydrate serving/snack + protein Limit intake of sweets and fried foods Avoid sugar sweetened drinks (soda, tea, coffee, sports drinks,  juices) Complete 3 Day Food Record and bring to next appt Get a Sharps container Carry fast acting glucose and a snack at all times Rotate injection sites Carry medical alert ID card Check feet daily Start Tresiba insulin   40 units daily as per MD instructed  Decrease Glimepiride 4mg  one tablet daily-if  BG's below 70, discontinue Glimepiride completely as per MD instructed  Return for appointment/classes on:  01-01-17   Expected Outcomes:  Demonstrated interest in learning. Expect positive outcomes  Education material provided: General meal planning guidelines, low BG handout, medical alert ID card, Living Well With Diabetes booklet, A1C handout, foot care handout, 1 tube glucose tablets for PRN use  If problems or questions, patient to contact team via:  [phone]  Future DSME appointment:  01-01-17

## 2016-12-16 NOTE — Patient Instructions (Signed)
  Check blood sugars 2 x day before breakfast and 2 hrs after supper every day  Bring blood sugar records to the next appointment/class  Exercise: walk  for    30  minutes  4 days a week  Eat 3 meals day,   2  snacks a day in afternoon and at bedtime  Space meals 4-5 hours apart  Eat 3 carbohydrate servings/meal + protein  Eat 1 carbohydrate serving/snack + protein  Limit intake of sweets and fried foods  Avoid sugar sweetened drinks (soda, tea, coffee, sports drinks, juices)  Complete 3 Day Food Record and bring to next appt  Get a Sharps container  Carry fast acting glucose and a snack at all times  Rotate injection sites  Carry medical alert ID card  Check feet daily  Return for appointment/classes on:  01-01-17

## 2016-12-19 ENCOUNTER — Telehealth: Payer: Self-pay | Admitting: *Deleted

## 2016-12-19 NOTE — Telephone Encounter (Signed)
Received call from patient. He had questions about using lotion on his feet. Instructed him to apply to top and bottom of feet - especially on his heels but not in between his toes. He verbalized understanding. Instructed him to call back if he has any further questions.

## 2016-12-26 ENCOUNTER — Telehealth: Payer: Self-pay | Admitting: Dietician

## 2016-12-26 NOTE — Telephone Encounter (Signed)
called pt for FU on BG's-pt reports no low BG's since starting Tresiba- BG's doing much better mostly 100's-180 with 200's x1-pt still gets up during night and eats and is trying to work on decreasing that-pt does not usually eat a bedtime snack-advised pt to eat small bedtime snack which may help curb hunger during night

## 2017-01-01 ENCOUNTER — Ambulatory Visit: Payer: Medicare Other | Admitting: Dietician

## 2017-01-13 ENCOUNTER — Encounter: Payer: Self-pay | Admitting: Dietician

## 2017-01-13 NOTE — Progress Notes (Signed)
Patient cancelled appointment for 01/01/17, did not wish to reschedule. Sent discharge letter to MD.

## 2017-03-11 ENCOUNTER — Encounter: Payer: Self-pay | Admitting: Emergency Medicine

## 2017-03-11 ENCOUNTER — Emergency Department
Admission: EM | Admit: 2017-03-11 | Discharge: 2017-03-12 | Disposition: A | Discharge status: Home / Self Care | Payer: Medicare Other | Attending: Emergency Medicine | Admitting: Emergency Medicine

## 2017-03-11 DIAGNOSIS — E1122 Type 2 diabetes mellitus with diabetic chronic kidney disease: Secondary | ICD-10-CM | POA: Insufficient documentation

## 2017-03-11 DIAGNOSIS — W57XXXA Bitten or stung by nonvenomous insect and other nonvenomous arthropods, initial encounter: Secondary | ICD-10-CM | POA: Insufficient documentation

## 2017-03-11 DIAGNOSIS — Z794 Long term (current) use of insulin: Secondary | ICD-10-CM | POA: Diagnosis not present

## 2017-03-11 DIAGNOSIS — Z7982 Long term (current) use of aspirin: Secondary | ICD-10-CM | POA: Insufficient documentation

## 2017-03-11 DIAGNOSIS — R21 Rash and other nonspecific skin eruption: Secondary | ICD-10-CM | POA: Insufficient documentation

## 2017-03-11 DIAGNOSIS — I129 Hypertensive chronic kidney disease with stage 1 through stage 4 chronic kidney disease, or unspecified chronic kidney disease: Secondary | ICD-10-CM | POA: Insufficient documentation

## 2017-03-11 DIAGNOSIS — N189 Chronic kidney disease, unspecified: Secondary | ICD-10-CM | POA: Insufficient documentation

## 2017-03-11 DIAGNOSIS — Z79899 Other long term (current) drug therapy: Secondary | ICD-10-CM | POA: Diagnosis not present

## 2017-03-11 NOTE — ED Triage Notes (Signed)
Patient ambulatory to triage with steady gait, without difficulty or distress noted; pt reports generalized itchy rash x week with no known cause

## 2017-03-12 DIAGNOSIS — R21 Rash and other nonspecific skin eruption: Secondary | ICD-10-CM | POA: Diagnosis not present

## 2017-03-12 MED ORDER — HYDROCORTISONE 1 % EX CREA
TOPICAL_CREAM | Freq: Once | CUTANEOUS | Status: AC
  Administered 2017-03-12: 01:00:00 via TOPICAL
  Filled 2017-03-12: qty 28

## 2017-03-12 MED ORDER — DIPHENHYDRAMINE HCL 25 MG PO CAPS
25.0000 mg | ORAL_CAPSULE | Freq: Once | ORAL | Status: AC
  Administered 2017-03-12: 25 mg via ORAL
  Filled 2017-03-12: qty 1

## 2017-03-12 NOTE — ED Notes (Signed)
Signature pad not responding at this time, pt signed hard copy of ED Discharge and placed on chart.

## 2017-03-12 NOTE — Discharge Instructions (Signed)
1. You may take Benadryl as needed for itching. 2. Apply hydrocortisone cream to affected area 3 times daily as needed for itching and rash. 3. Return to the ER for worsening symptoms, persistent vomiting, difficulty breathing or other concerns.

## 2017-03-12 NOTE — ED Notes (Signed)

## 2017-03-12 NOTE — ED Provider Notes (Signed)
[facility] Emergency Department Provider Note   ____________________________________________   First MD Initiated Contact with Patient 03/12/17 0000     (approximate)  I have reviewed the triage vital signs and the nursing notes.   HISTORY  Chief Complaint Rash    HPI Danny Cook. is a 56 y.o. male brought from group home with a chief complaint of rash. Patient reports itchy rash to bilateral forearms and left thigh for the past 1-1/2 weeks.Found 2 small black insects in his bed. Washed his sheets and the rash to his right forearm and left thigh have improved. Rash to left forearm persists. denies new food or environmental exposures. Denies new medications including over-the-counter medications. Denies fever, chills, facial or tongue swelling, chest pain, wheezing, shortness of breath, abdominal pain, nausea, vomiting. Denies recent travel or trauma.   Past Medical History:  Diagnosis Date  . Anemia   . Anxiety   . Arthritis   . Chronic kidney disease    Renal Insufficiency Syndrome; Glomerulosclerosis 2013  . Complication of anesthesia   . Depressed   . Diabetes mellitus without complication ([place])   . High cholesterol   . HIV (human immunodeficiency virus infection) ([place])   . Hypertension   . 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 Paranoid disorder ([place])   . Schizophrenia, paranoid ([place])   . Sleep apnea     There are no active problems to display for this patient.   Past Surgical History:  Procedure Laterality Date  . COLONOSCOPY WITH PROPOFOL N/A 09/03/2015   Procedure: COLONOSCOPY WITH PROPOFOL;  Surgeon: Lollie Sails, MD;  Location[facility] ENDOSCOPY;  Service: Endoscopy;  Laterality: N/A;  . DG TEETH FULL    . ESOPHAGOGASTRODUODENOSCOPY (EGD) WITH PROPOFOL N/A 07/01/2016   Procedure: ESOPHAGOGASTRODUODENOSCOPY (EGD) WITH PROPOFOL;  Surgeon: Lollie Sails, MD;  Location: [facility] ENDOSCOPY;  Service: Endoscopy;  Laterality: N/A;    Prior  to Admission medications   Medication Sig Start Date End Date Taking? Authorizing Provider  Abacavir-Dolutegravir-Lamivud (TRIUMEQ) 600-50-300 MG TABS Take 1 tablet by mouth daily.    [provider]  amitriptyline (ELAVIL) 25 MG tablet Take 1 tablet by mouth at bedtime. 11/12/16   [provider]  aspirin 325 MG tablet Take 325 mg by mouth daily.    [provider]  B Complex Vitamins (VITAMIN B COMPLEX PO) Take 1 tablet by mouth daily.    [provider]  clonazePAM (KLONOPIN) 2 MG tablet Take 2 mg by mouth 2 (two) times daily.    [provider]  Cyanocobalamin (VITAMIN B 12 PO) Take 1,000 mcg by mouth daily.    [provider]  divalproex (DEPAKOTE ER) 250 MG 24 hr tablet Take 250 mg by mouth daily.    [provider]  divalproex (DEPAKOTE ER) 250 MG 24 hr tablet Take 500 mg by mouth at bedtime.    [provider]  FANAPT 10 MG TABS Take 1 tablet by mouth 2 (two) times daily. 11/14/16   [provider]  fluticasone (FLONASE) 50 MCG/ACT nasal spray Place 2 sprays into the nose at bedtime. 12/11/16 12/11/17  [provider]  gabapentin (NEURONTIN) 300 MG capsule Take 300 mg by mouth 3 (three) times daily. 08/21/16   [provider]  gemfibrozil (LOPID) 600 MG tablet Take 1 tablet by mouth 2 (two) times daily. 11/12/16   [provider]  glimepiride (AMARYL) 4 MG tablet Take 4 mg by mouth daily. 08/21/16   [provider]  glucose 4 GM chewable tablet Chew 4 tablets by mouth as needed. 12/16/16 12/16/17  [provider]  Insulin Degludec 200 UNIT/ML SOPN Inject 40 Units into the skin daily. 12/16/16   [provider]  liraglutide 18 MG/3ML SOPN Inject 1.8 mg into the skin daily. 11/06/16 11/06/17  [provider]  lisinopril (PRINIVIL,ZESTRIL) 20 MG tablet Take 20 mg by mouth daily.    [provider]  meclizine (ANTIVERT) 25 MG tablet Take 1 tablet (25 mg  total) by mouth 3 (three) times daily as needed for dizziness or nausea. Patient not taking: Reported on 12/16/2016 04/12/16   Carrie Mew, MD  metFORMIN (GLUCOPHAGE) 1000 MG tablet Take 1,000 mg by mouth 2 (two) times daily with a meal.    [provider]  mirtazapine (REMERON) 30 MG tablet Take 30 mg by mouth at bedtime.    [provider]  Multiple Vitamins-Minerals (MULTIVITAMIN WITH MINERALS) tablet Take 1 tablet by mouth daily.    [provider]  omeprazole (PRILOSEC) 40 MG capsule Take 40 mg by mouth 2 (two) times daily.    [provider]  ondansetron (ZOFRAN ODT) 4 MG disintegrating tablet Take 1 tablet (4 mg total) by mouth every 8 (eight) hours as needed for nausea or vomiting. Patient not taking: Reported on 12/16/2016 04/12/16   Carrie Mew, MD  promethazine (PHENERGAN) 6.25 MG/5ML syrup Take 6.25 mg by mouth every 6 (six) hours as needed for nausea or vomiting.    [provider]  rosuvastatin (CRESTOR) 20 MG tablet Take 20 mg by mouth daily.    [provider]  sucralfate (CARAFATE) 1 G tablet Take 1 g by mouth 4 (four) times daily -  with meals and at bedtime.    [provider]  triamcinolone cream (KENALOG) 0.1 % Apply 1 application topically 2 (two) times daily as needed. 11/16/16   Little, Traci M, PA-C    Allergies Trazodone and nefazodone; Viread [tenofovir disoproxil]; and Etodolac  Family History  Problem Relation Age of Onset  . Heart attack Mother   . Diabetes Mellitus II Mother   . Mental illness Mother   . CAD Mother   . Heart attack Father   . CAD Father   . Hypertension Father     Social History Social History  Substance Use Topics  . Smoking status: Never Smoker  . Smokeless tobacco: Never Used  . Alcohol use No    Review of Systems  Constitutional: No fever/chills. Eyes: No visual changes. ENT: No sore throat. Cardiovascular: Denies chest pain. Respiratory: Denies  shortness of breath. Gastrointestinal: No abdominal pain.  No nausea, no vomiting.  No diarrhea.  No constipation. Genitourinary: Negative for dysuria. Musculoskeletal: Negative for back pain. Skin: positive for rash. Neurological: Negative for headaches, focal weakness or numbness.   ____________________________________________   PHYSICAL EXAM:  VITAL SIGNS: ED Triage Vitals  Enc Vitals Group     BP 03/11/17 2145 (!) 161/79     Pulse Rate 03/11/17 2145 81     Resp 03/11/17 2145 20     Temp 03/11/17 2145 97.8 F (36.6 C)     Temp Source 03/11/17 2145 Oral     SpO2 03/11/17 2145 97 %     Weight 03/11/17 2144 221 lb (100.2 kg)     Height 03/11/17 2144 5\' 7"  (1.702 m)     Head Circumference --      Peak Flow --      Pain Score --  Pain Loc --      Pain Edu? --      Excl. in [place]? --     Constitutional: Alert and oriented. Well appearing and in no acute distress. Eyes: Conjunctivae are normal. PERRL. EOMI. Head: Atraumatic. Nose: No congestion/rhinnorhea. Mouth/Throat: Mucous membranes are moist.  Oropharynx non-erythematous. Neck: No stridor.   Cardiovascular: Normal rate, regular rhythm. Grossly normal heart sounds.  Good peripheral circulation. Respiratory: Normal respiratory effort.  No retractions. Lungs CTAB. Gastrointestinal: Soft and nontender. No distention. No abdominal bruits. No CVA tenderness. Musculoskeletal: No lower extremity tenderness nor edema.  No joint effusions. Neurologic:  Normal speech and language. No gross focal neurologic deficits are appreciated. No gait instability. Skin:  Skin is warm, dry and intact. Small red papules in a linear distribution along inside of left forearm. No petechiae. Psychiatric: Mood and affect are normal. Speech and behavior are normal.  ____________________________________________   LABS (all labs ordered are listed, but only abnormal results are displayed)  Labs Reviewed - No data to  display ____________________________________________  EKG  none ____________________________________________  RADIOLOGY  No results found.  ____________________________________________   PROCEDURES  Procedure(s) performed: None  Procedures  Critical Care performed: No  ____________________________________________   INITIAL IMPRESSION / ASSESSMENT AND PLAN / ED COURSE  Pertinent labs & imaging results that were available during my care of the patient were reviewed by me and considered in my medical decision making (see chart for details).  56 year old male who presents with itchy rash secondary to bedbugs. Will administer oral Benadryl, apply hydrocortisone cream. Instructed patient to launder all of his belongings in very hot water. Strict return precautions given. Patient verbalizes understanding and agrees with plan of care.      ____________________________________________   FINAL CLINICAL IMPRESSION(S) / ED DIAGNOSES  Final diagnoses:  Bedbug bite, initial encounter      NEW MEDICATIONS STARTED DURING THIS VISIT:  New Prescriptions   No medications on file     Note:  This document was prepared using Dragon voice recognition software and may include unintentional dictation errors.    Paulette Blanch, MD 03/12/17 0630

## 2017-04-26 ENCOUNTER — Emergency Department
Admission: EM | Admit: 2017-04-26 | Discharge: 2017-04-26 | Disposition: A | Discharge status: Home / Self Care | Payer: Medicare Other | Attending: Emergency Medicine | Admitting: Emergency Medicine

## 2017-04-26 ENCOUNTER — Encounter: Payer: Self-pay | Admitting: Emergency Medicine

## 2017-04-26 DIAGNOSIS — Z79899 Other long term (current) drug therapy: Secondary | ICD-10-CM | POA: Diagnosis not present

## 2017-04-26 DIAGNOSIS — N189 Chronic kidney disease, unspecified: Secondary | ICD-10-CM | POA: Insufficient documentation

## 2017-04-26 DIAGNOSIS — Z7982 Long term (current) use of aspirin: Secondary | ICD-10-CM | POA: Insufficient documentation

## 2017-04-26 DIAGNOSIS — F329 Major depressive disorder, single episode, unspecified: Secondary | ICD-10-CM | POA: Diagnosis not present

## 2017-04-26 DIAGNOSIS — E1122 Type 2 diabetes mellitus with diabetic chronic kidney disease: Secondary | ICD-10-CM | POA: Insufficient documentation

## 2017-04-26 DIAGNOSIS — Z21 Asymptomatic human immunodeficiency virus [HIV] infection status: Secondary | ICD-10-CM | POA: Insufficient documentation

## 2017-04-26 DIAGNOSIS — R739 Hyperglycemia, unspecified: Secondary | ICD-10-CM | POA: Diagnosis present

## 2017-04-26 DIAGNOSIS — Z794 Long term (current) use of insulin: Secondary | ICD-10-CM | POA: Insufficient documentation

## 2017-04-26 DIAGNOSIS — F209 Schizophrenia, unspecified: Secondary | ICD-10-CM | POA: Insufficient documentation

## 2017-04-26 DIAGNOSIS — E1165 Type 2 diabetes mellitus with hyperglycemia: Secondary | ICD-10-CM | POA: Insufficient documentation

## 2017-04-26 DIAGNOSIS — F419 Anxiety disorder, unspecified: Secondary | ICD-10-CM | POA: Insufficient documentation

## 2017-04-26 DIAGNOSIS — I129 Hypertensive chronic kidney disease with stage 1 through stage 4 chronic kidney disease, or unspecified chronic kidney disease: Secondary | ICD-10-CM | POA: Diagnosis not present

## 2017-04-26 LAB — CBC
HEMATOCRIT: 36.3 % — AB (ref 40.0–52.0)
Hemoglobin: 12.6 g/dL — ABNORMAL LOW (ref 13.0–18.0)
MCH: 32.7 pg (ref 26.0–34.0)
MCHC: 34.9 g/dL (ref 32.0–36.0)
MCV: 93.9 fL (ref 80.0–100.0)
PLATELETS: 233 10*3/uL (ref 150–440)
RBC: 3.86 MIL/uL — AB (ref 4.40–5.90)
RDW: 14.1 % (ref 11.5–14.5)
WBC: 6.5 10*3/uL (ref 3.8–10.6)

## 2017-04-26 LAB — BASIC METABOLIC PANEL
Anion gap: 10 (ref 5–15)
BUN: 21 mg/dL — AB (ref 6–20)
CHLORIDE: 101 mmol/L (ref 101–111)
CO2: 24 mmol/L (ref 22–32)
CREATININE: 1.21 mg/dL (ref 0.61–1.24)
Calcium: 9.7 mg/dL (ref 8.9–10.3)
GFR calc non Af Amer: >60 mL/min (ref >60)
Glucose, Bld: 384 mg/dL — ABNORMAL HIGH (ref 65–99)
POTASSIUM: 4.7 mmol/L (ref 3.5–5.1)
Sodium: 135 mmol/L (ref 135–145)

## 2017-04-26 LAB — GLUCOSE, CAPILLARY
Glucose-Capillary: 344 mg/dL — ABNORMAL HIGH (ref 65–99)
Glucose-Capillary: 244 mg/dL — ABNORMAL HIGH (ref 65–99)

## 2017-04-26 MED ORDER — SODIUM CHLORIDE 0.9 % IV BOLUS (SEPSIS)
1000.0000 mL | Freq: Once | INTRAVENOUS | Status: AC
  Administered 2017-04-26: 1000 mL via INTRAVENOUS

## 2017-04-26 NOTE — ED Notes (Signed)
Verbal order from Dr. Burlene Arnt for NS 1,000 mL bolus

## 2017-04-26 NOTE — ED Notes (Signed)
Report off to rebecca rn 

## 2017-04-26 NOTE — ED Notes (Signed)
Pt really feeling weak and noticed elevated blood sugar.  Pt reports nausea.  No v/d.   No chest pain or abd pain.  Pt denies sob.  Iv fluids infusing.   nsr on monitor.  Skin warm and dry  Pt alert.  Speech clear.

## 2017-04-26 NOTE — ED Provider Notes (Signed)
[facility] Emergency Department Provider Note  ____________________________________________   I have reviewed the triage vital signs and the nursing notes.   HISTORY  Chief Complaint Hyperglycemia    HPI Danny Cook. is a 56 y.o. male who presents today complaining of high sugar.  Patient has a history of diabetes which is controlled by oral medications, he had a cherry pie and some [place] sugar went up.  He has no other complaints or concerns.-Today, no antecedent or associated symptoms, no prior treatment no complaints    Past Medical History:  Diagnosis Date  . Anemia   . Anxiety   . Arthritis   . Chronic kidney disease    Renal Insufficiency Syndrome; Glomerulosclerosis 2013  . Complication of anesthesia   . Depressed   . Diabetes mellitus without complication ([place])   . High cholesterol   . HIV (human immunodeficiency virus infection) ([place])   . Hypertension   . 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 Paranoid disorder ([place])   . Schizophrenia, paranoid ([place])   . Sleep apnea     There are no active problems to display for this patient.   Past Surgical History:  Procedure Laterality Date  . COLONOSCOPY WITH PROPOFOL N/A 09/03/2015   Procedure: COLONOSCOPY WITH PROPOFOL;  Surgeon: Lollie Sails, MD;  Location: [facility] ENDOSCOPY;  Service: Endoscopy;  Laterality: N/A;  . DG TEETH FULL    . ESOPHAGOGASTRODUODENOSCOPY (EGD) WITH PROPOFOL N/A 07/01/2016   Procedure: ESOPHAGOGASTRODUODENOSCOPY (EGD) WITH PROPOFOL;  Surgeon: Lollie Sails, MD;  Location: [facility] ENDOSCOPY;  Service: Endoscopy;  Laterality: N/A;    Prior to Admission medications   Medication Sig Start Date End Date Taking? Authorizing Provider  Abacavir-Dolutegravir-Lamivud (TRIUMEQ) 600-50-300 MG TABS Take 1 tablet by mouth daily.   Yes [provider]  amitriptyline (ELAVIL) 25 MG tablet Take 1 tablet by mouth at bedtime. 11/12/16  Yes [provider]  aspirin 325 MG  tablet Take 325 mg by mouth daily.   Yes [provider]  B Complex Vitamins (VITAMIN B COMPLEX PO) Take 1 tablet by mouth daily.   Yes [provider]  clonazePAM (KLONOPIN) 2 MG tablet Take 2 mg by mouth 2 (two) times daily.   Yes [provider]  divalproex (DEPAKOTE ER) 250 MG 24 hr tablet Take 250 mg by mouth daily.   Yes [provider]  divalproex (DEPAKOTE ER) 250 MG 24 hr tablet Take 500 mg by mouth at bedtime.   Yes [provider]  FANAPT 10 MG TABS Take 1 tablet by mouth 2 (two) times daily. 11/14/16  Yes [provider]  gabapentin (NEURONTIN) 300 MG capsule Take 300 mg by mouth 3 (three) times daily. 08/21/16  Yes [provider]  gemfibrozil (LOPID) 600 MG tablet Take 1 tablet by mouth 2 (two) times daily. 11/12/16  Yes [provider]  glimepiride (AMARYL) 4 MG tablet Take 4 mg by mouth daily. 08/21/16  Yes [provider]  Insulin Degludec 200 UNIT/ML SOPN Inject 40 Units into the skin daily. 12/16/16  Yes [provider]  liraglutide 18 MG/3ML SOPN Inject 1.8 mg into the skin daily. 11/06/16 11/06/17 Yes [provider]  lisinopril (PRINIVIL,ZESTRIL) 20 MG tablet Take 20 mg by mouth daily.   Yes [provider]  metFORMIN (GLUCOPHAGE) 1000 MG tablet Take 1,000 mg by mouth 2 (two) times daily with a meal.   Yes [provider]  mirtazapine (REMERON) 30 MG tablet Take 30 mg by  mouth at bedtime.   Yes [provider]  Multiple Vitamins-Minerals (MULTIVITAMIN WITH MINERALS) tablet Take 1 tablet by mouth daily.   Yes [provider]  omeprazole (PRILOSEC) 40 MG capsule Take 40 mg by mouth 2 (two) times daily.   Yes [provider]  fluticasone (FLONASE) 50 MCG/ACT nasal spray Place 2 sprays into the nose at bedtime. 12/11/16 12/11/17  [provider]  glucose 4 GM chewable tablet Chew 4 tablets by mouth as needed. 12/16/16 12/16/17  [provider]  meclizine (ANTIVERT) 25 MG tablet Take 1 tablet (25 mg total) by mouth 3 (three) times daily as needed for dizziness or nausea. Patient not taking: Reported on 12/16/2016 04/12/16   Carrie Mew, MD  ondansetron (ZOFRAN ODT) 4 MG disintegrating tablet Take 1 tablet (4 mg total) by mouth every 8 (eight) hours as needed for nausea or vomiting. Patient not taking: Reported on 12/16/2016 04/12/16   Carrie Mew, MD  promethazine (PHENERGAN) 6.25 MG/5ML syrup Take 6.25 mg by mouth every 6 (six) hours as needed for nausea or vomiting.    [provider]  triamcinolone cream (KENALOG) 0.1 % Apply 1 application topically 2 (two) times daily as needed. 11/16/16   Little, Traci M, PA-C    Allergies Trazodone and nefazodone; Viread [tenofovir disoproxil]; and Etodolac  Family History  Problem Relation Age of Onset  . Heart attack Mother   . Diabetes Mellitus II Mother   . Mental illness Mother   . CAD Mother   . Heart attack Father   . CAD Father   . Hypertension Father     Social History Social History  Substance Use Topics  . Smoking status: Never Smoker  . Smokeless tobacco: Never Used  . Alcohol use No    Review of Systems Constitutional: No fever/chills Eyes: No visual changes. ENT: No sore throat. No stiff neck no neck pain Cardiovascular: Denies chest pain. Respiratory: Denies shortness of breath. Gastrointestinal:   no vomiting.  No diarrhea.  No constipation. Genitourinary: Negative for dysuria. Musculoskeletal: Negative lower extremity swelling Skin: Negative for rash. Neurological: Negative for severe headaches, focal weakness or numbness.   ____________________________________________   PHYSICAL EXAM:  VITAL SIGNS: ED Triage Vitals  Enc Vitals Group     BP 04/26/17 1700 (!) 153/87     Pulse Rate 04/26/17 1700 84     Resp 04/26/17 1700 16     Temp 04/26/17 1700 98 F (36.7 C)     Temp Source 04/26/17 1700 Oral     SpO2 04/26/17  1700 97 %     Weight 04/26/17 1701 215 lb (97.5 kg)     Height --      Head Circumference --      Peak Flow --      Pain Score --      Pain Loc --      Pain Edu? --      Excl. in [place]? --     Constitutional: Alert and oriented. Well appearing and in no acute distress. Eyes: Conjunctivae are normal Head: Atraumatic HEENT: No congestion/rhinnorhea. Mucous membranes are moist.  Oropharynx non-erythematous Neck:   Nontender with no meningismus, no masses, no stridor Cardiovascular: Normal rate, regular rhythm. Grossly normal heart sounds.  Good peripheral circulation. Respiratory: Normal respiratory effort.  No retractions. Lungs CTAB. Abdominal: Soft and nontender. No distention. No guarding no rebound Back:  There is no focal tenderness or step off.  there is no midline tenderness there are no  lesions noted. there is no CVA tenderness  Musculoskeletal: No lower extremity tenderness, no upper extremity tenderness. No joint effusions, no DVT signs strong distal pulses no edema Neurologic:  Normal speech and language. No gross focal neurologic deficits are appreciated.  Skin:  Skin is warm, dry and intact. No rash noted. Psychiatric: Mood and affect are normal. Speech and behavior are normal.  ____________________________________________   LABS (all labs ordered are listed, but only abnormal results are displayed)  Labs Reviewed  BASIC METABOLIC PANEL - Abnormal; Notable for the following:       Result Value   Glucose, Bld 384 (*)    BUN 21 (*)    All other components within normal limits  CBC - Abnormal; Notable for the following:    RBC 3.86 (*)    Hemoglobin 12.6 (*)    HCT 36.3 (*)    All other components within normal limits  GLUCOSE, CAPILLARY - Abnormal; Notable for the following:    Glucose-Capillary 344 (*)    All other components within normal limits  URINALYSIS, COMPLETE (UACMP) WITH MICROSCOPIC  CBG MONITORING, ED  CBG MONITORING, ED    Pertinent labs  results  that were available during my care of the patient were reviewed by me and considered in my medical decision making (see chart for details). ____________________________________________  EKG  I personally interpreted any EKGs ordered by me or triage  ____________________________________________  RADIOLOGY  Pertinent labs & imaging results that were available during my care of the patient were reviewed by me and considered in my medical decision making (see chart for details). If possible, patient and/or family made aware of any abnormal findings. ____________________________________________    PROCEDURES  Procedure(s) performed: None  Procedures  Critical Care performed: None  ____________________________________________   INITIAL IMPRESSION / ASSESSMENT AND PLAN / ED COURSE  Pertinent labs & imaging results that were available during my care of the patient were reviewed by me and considered in my medical decision making (see chart for details).  Patient a cherry pie had a high sugar we gave him fluids and sugar evaluation we will discharge him we have advised him about diabetic diet etc.  Sugar now in the 240s which is close to his baseline.  Denies chest pain shortness of breath or concomitant symptoms.    ____________________________________________   FINAL CLINICAL IMPRESSION(S) / ED DIAGNOSES  Final diagnoses:  None      This chart was dictated using voice recognition software.  Despite best efforts to proofread,  errors can occur which can change meaning.      Schuyler Amor, MD 04/26/17 2011

## 2017-04-26 NOTE — ED Notes (Signed)
CBG 244 

## 2017-04-26 NOTE — ED Triage Notes (Signed)
Pt to ED via POV, pt states that today his blood sugar has been elevated. Pt states that his blood sugar was in the 400's at home. Pt in NAD at this time. Respirations are equal and unlabored.

## 2017-04-26 NOTE — Discharge Instructions (Signed)
Follow diabetic diet return to the emergency room for any new or worrisome symptoms.

## 2017-07-25 ENCOUNTER — Emergency Department: Payer: Medicare Other

## 2017-07-25 ENCOUNTER — Other Ambulatory Visit: Payer: Self-pay

## 2017-07-25 ENCOUNTER — Other Ambulatory Visit
Admission: RE | Admit: 2017-07-25 | Discharge: 2017-07-25 | Disposition: A | Discharge status: Home / Self Care | Payer: Medicare Other | Source: Ambulatory Visit | Attending: *Deleted | Admitting: *Deleted

## 2017-07-25 ENCOUNTER — Emergency Department
Admission: EM | Admit: 2017-07-25 | Discharge: 2017-07-25 | Disposition: A | Discharge status: Home / Self Care | Payer: Medicare Other | Attending: Emergency Medicine | Admitting: Emergency Medicine

## 2017-07-25 DIAGNOSIS — R011 Cardiac murmur, unspecified: Secondary | ICD-10-CM | POA: Diagnosis not present

## 2017-07-25 DIAGNOSIS — I129 Hypertensive chronic kidney disease with stage 1 through stage 4 chronic kidney disease, or unspecified chronic kidney disease: Secondary | ICD-10-CM | POA: Insufficient documentation

## 2017-07-25 DIAGNOSIS — R0602 Shortness of breath: Secondary | ICD-10-CM | POA: Insufficient documentation

## 2017-07-25 DIAGNOSIS — R0789 Other chest pain: Secondary | ICD-10-CM | POA: Insufficient documentation

## 2017-07-25 DIAGNOSIS — R0989 Other specified symptoms and signs involving the circulatory and respiratory systems: Secondary | ICD-10-CM | POA: Diagnosis not present

## 2017-07-25 DIAGNOSIS — R5381 Other malaise: Secondary | ICD-10-CM | POA: Insufficient documentation

## 2017-07-25 DIAGNOSIS — R5383 Other fatigue: Secondary | ICD-10-CM

## 2017-07-25 DIAGNOSIS — N189 Chronic kidney disease, unspecified: Secondary | ICD-10-CM | POA: Diagnosis not present

## 2017-07-25 DIAGNOSIS — Z794 Long term (current) use of insulin: Secondary | ICD-10-CM | POA: Insufficient documentation

## 2017-07-25 DIAGNOSIS — B20 Human immunodeficiency virus [HIV] disease: Secondary | ICD-10-CM | POA: Insufficient documentation

## 2017-07-25 DIAGNOSIS — E1122 Type 2 diabetes mellitus with diabetic chronic kidney disease: Secondary | ICD-10-CM | POA: Insufficient documentation

## 2017-07-25 DIAGNOSIS — R000 Tachycardia, unspecified: Secondary | ICD-10-CM | POA: Diagnosis not present

## 2017-07-25 LAB — CBC
HEMATOCRIT: 38.4 % — AB (ref 40.0–52.0)
HEMOGLOBIN: 13.2 g/dL (ref 13.0–18.0)
MCH: 32.8 pg (ref 26.0–34.0)
MCHC: 34.3 g/dL (ref 32.0–36.0)
MCV: 95.5 fL (ref 80.0–100.0)
Platelets: 274 10*3/uL (ref 150–440)
RBC: 4.02 MIL/uL — ABNORMAL LOW (ref 4.40–5.90)
RDW: 13.3 % (ref 11.5–14.5)
WBC: 7.2 10*3/uL (ref 3.8–10.6)

## 2017-07-25 LAB — BASIC METABOLIC PANEL
ANION GAP: 11 (ref 5–15)
BUN: 26 mg/dL — ABNORMAL HIGH (ref 6–20)
CHLORIDE: 106 mmol/L (ref 101–111)
CO2: 23 mmol/L (ref 22–32)
Calcium: 9.5 mg/dL (ref 8.9–10.3)
Creatinine, Ser: 1.22 mg/dL (ref 0.61–1.24)
GFR calc Af Amer: >60 mL/min (ref >60)
GLUCOSE: 216 mg/dL — AB (ref 65–99)
POTASSIUM: 4.4 mmol/L (ref 3.5–5.1)
Sodium: 140 mmol/L (ref 135–145)

## 2017-07-25 LAB — TROPONIN I: Troponin I: <0.03 ng/mL (ref <0.03)

## 2017-07-25 LAB — FIBRIN DERIVATIVES D-DIMER (ARMC ONLY): FIBRIN DERIVATIVES D-DIMER (ARMC): 538.88 ng{FEU}/mL — AB (ref 0.00–499.00)

## 2017-07-25 MED ORDER — FUROSEMIDE 10 MG/ML IJ SOLN
20.0000 mg | Freq: Once | INTRAMUSCULAR | Status: AC
  Administered 2017-07-25: 20 mg via INTRAVENOUS
  Filled 2017-07-25: qty 4

## 2017-07-25 MED ORDER — IOPAMIDOL (ISOVUE-370) INJECTION 76%
75.0000 mL | Freq: Once | INTRAVENOUS | Status: AC | PRN
  Administered 2017-07-25: 75 mL via INTRAVENOUS

## 2017-07-25 MED ORDER — ALBUTEROL SULFATE (2.5 MG/3ML) 0.083% IN NEBU
5.0000 mg | INHALATION_SOLUTION | Freq: Once | RESPIRATORY_TRACT | Status: AC
  Administered 2017-07-25: 5 mg via RESPIRATORY_TRACT
  Filled 2017-07-25: qty 6

## 2017-07-25 NOTE — ED Notes (Signed)
Topez not working 

## 2017-07-25 NOTE — ED Notes (Signed)
FIRST NURSE NOTE: Pt states he was sent over for abnormal labs and needs a CT scan.

## 2017-07-25 NOTE — ED Notes (Signed)
Full rainbow drawn for ? Add on testing

## 2017-07-25 NOTE — ED Provider Notes (Signed)
[facility] Emergency Department Provider Note   ____________________________________________    I have reviewed the triage vital signs and the nursing notes.   HISTORY  Chief Complaint Shortness of breath    HPI Danny Ambs. is a 57 y.o. male who presents with complaints of chest pain and shortness of breath.  Apparently the patient went to urgent care today because he has been feeling weak and fatigued over the last 3 weeks.  He also states that he has had shortness of breath with exertion "more than usual ".  He had chest pain while in triage but none now.  He had labs done at urgent care and they called him and said he needed to come to the emergency department for a CT scan but he does not know what lab he had.  Denies cough or fever.  Has a history of HIV schizophrenia diabetes as listed below.   Past Medical History:  Diagnosis Date  . Anemia   . Anxiety   . Arthritis   . Chronic kidney disease    Renal Insufficiency Syndrome; Glomerulosclerosis 2013  . Complication of anesthesia   . Depressed   . Diabetes mellitus without complication ([place])   . High cholesterol   . HIV (human immunodeficiency virus infection) ([place])   . Hypertension   . 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 Paranoid disorder ([place])   . Schizophrenia, paranoid ([place])   . Sleep apnea     There are no active problems to display for this patient.   Past Surgical History:  Procedure Laterality Date  . COLONOSCOPY WITH PROPOFOL N/A 09/03/2015   Procedure: COLONOSCOPY WITH PROPOFOL;  Surgeon: Lollie Sails, MD;  Location: [facility] ENDOSCOPY;  Service: Endoscopy;  Laterality: N/A;  . DG TEETH FULL    . ESOPHAGOGASTRODUODENOSCOPY (EGD) WITH PROPOFOL N/A 07/01/2016   Procedure: ESOPHAGOGASTRODUODENOSCOPY (EGD) WITH PROPOFOL;  Surgeon: Lollie Sails, MD;  Location: [facility] ENDOSCOPY;  Service: Endoscopy;  Laterality: N/A;    Prior to Admission medications   Medication Sig Start  Date End Date Taking? Authorizing Provider  Abacavir-Dolutegravir-Lamivud (TRIUMEQ) 600-50-300 MG TABS Take 1 tablet by mouth daily.    [provider]  amitriptyline (ELAVIL) 25 MG tablet Take 1 tablet by mouth at bedtime. 11/12/16   [provider]  aspirin 325 MG tablet Take 325 mg by mouth daily.    [provider]  B Complex Vitamins (VITAMIN B COMPLEX PO) Take 1 tablet by mouth daily.    [provider]  clonazePAM (KLONOPIN) 2 MG tablet Take 2 mg by mouth 2 (two) times daily.    [provider]  divalproex (DEPAKOTE ER) 250 MG 24 hr tablet Take 250 mg by mouth daily.    [provider]  divalproex (DEPAKOTE ER) 250 MG 24 hr tablet Take 500 mg by mouth at bedtime.    [provider]  FANAPT 10 MG TABS Take 1 tablet by mouth 2 (two) times daily. 11/14/16   [provider]  fluticasone (FLONASE) 50 MCG/ACT nasal spray Place 2 sprays into the nose at bedtime. 12/11/16 12/11/17  [provider]  gabapentin (NEURONTIN) 300 MG capsule Take 300 mg by mouth 3 (three) times daily. 08/21/16   [provider]  gemfibrozil (LOPID) 600 MG tablet Take 1 tablet by mouth 2 (two) times daily. 11/12/16   [provider]  glimepiride (AMARYL) 4 MG tablet Take 4 mg by mouth daily. 08/21/16   [provider]  glucose 4 GM chewable tablet Chew 4 tablets by mouth as needed. 12/16/16 12/16/17  [provider]  Insulin Degludec 200 UNIT/ML SOPN Inject 40 Units into the skin daily. 12/16/16   [provider]  liraglutide 18 MG/3ML SOPN Inject 1.8 mg into the skin daily. 11/06/16 11/06/17  [provider]  lisinopril (PRINIVIL,ZESTRIL) 20 MG tablet Take 20 mg by mouth daily.    [provider]  meclizine (ANTIVERT) 25 MG tablet Take 1 tablet (25 mg total) by mouth 3 (three) times daily as needed for dizziness or nausea. Patient not taking: Reported on 12/16/2016 04/12/16   Carrie Mew, MD  metFORMIN (GLUCOPHAGE) 1000 MG tablet Take 1,000 mg by mouth 2 (two) times daily with a meal.    [provider]  mirtazapine (REMERON) 30 MG tablet Take 30 mg by mouth at bedtime.    [provider]  Multiple Vitamins-Minerals (MULTIVITAMIN WITH MINERALS) tablet Take 1 tablet by mouth daily.    [provider]  omeprazole (PRILOSEC) 40 MG capsule Take 40 mg by mouth 2 (two) times daily.    [provider]  ondansetron (ZOFRAN ODT) 4 MG disintegrating tablet Take 1 tablet (4 mg total) by mouth every 8 (eight) hours as needed for nausea or vomiting. Patient not taking: Reported on 12/16/2016 04/12/16   Carrie Mew, MD  promethazine (PHENERGAN) 6.25 MG/5ML syrup Take 6.25 mg by mouth every 6 (six) hours as needed for nausea or vomiting.    [provider]  triamcinolone cream (KENALOG) 0.1 % Apply 1 application topically 2 (two) times daily as needed. 11/16/16   Little, Traci M, PA-C     Allergies Trazodone and nefazodone; Viread [tenofovir disoproxil]; and Etodolac  Family History  Problem Relation Age of Onset  . Heart attack Mother   . Diabetes Mellitus II Mother   . Mental illness Mother   . CAD Mother   . Heart attack Father   . CAD Father   . Hypertension Father     Social History Social History   Tobacco Use  . Smoking status: Never Smoker  . Smokeless tobacco: Never Used  Substance Use Topics  . Alcohol use: No  . Drug use: No    Review of Systems  Constitutional: Fatigue Eyes: No visual changes.  ENT: No sore throat. Cardiovascular: Chest pain resolved Respiratory: As above Gastrointestinal: No abdominal pain.  No nausea, no vomiting.   Genitourinary: Negative for dysuria. Musculoskeletal: Negative for back pain. Skin: Negative for rash. Neurological: Negative for headaches    ____________________________________________   PHYSICAL EXAM:  VITAL SIGNS: ED Triage Vitals  Enc Vitals Group      BP 07/25/17 1206 116/60     Pulse Rate 07/25/17 1206 (!) 102     Resp 07/25/17 1206 18     Temp 07/25/17 1206 97.8 F (36.6 C)     Temp Source 07/25/17 1206 Oral     SpO2 07/25/17 1206 97 %     Weight 07/25/17 1204 99.8 kg (220 lb)     Height 07/25/17 1204 1.702 m (5\' 7" )     Head Circumference --      Peak Flow --      Pain Score 07/25/17 1204 5     Pain Loc --      Pain Edu? --      Excl. in [place]? --     Constitutional: Alert and oriented. No acute distress.  Eyes: Conjunctivae are normal.   Nose: No congestion/rhinnorhea. Mouth/Throat:  Mucous membranes are moist.    Cardiovascular: Normal rate, regular rhythm.  Systolic ejection murmur.  Good peripheral circulation. Respiratory: Normal respiratory effort.  No retractions. Lungs CTAB. Gastrointestinal: Soft and nontender. No distention.  No CVA tenderness. Genitourinary: deferred Musculoskeletal: No lower extremity tenderness nor edema.  Warm and well perfused Neurologic:  Normal speech and language. No gross focal neurologic deficits are appreciated.  Skin:  Skin is warm, dry and intact. No rash noted. Psychiatric: Mood and affect are normal. Speech and behavior are normal.  ____________________________________________   LABS (all labs ordered are listed, but only abnormal results are displayed)  Labs Reviewed  BASIC METABOLIC PANEL - Abnormal; Notable for the following components:      Result Value   Glucose, Bld 216 (*)    BUN 26 (*)    All other components within normal limits  CBC - Abnormal; Notable for the following components:   RBC 4.02 (*)    HCT 38.4 (*)    All other components within normal limits  TROPONIN I   ____________________________________________  EKG  ED ECG REPORT I, Lavonia Drafts, the attending physician, personally viewed and interpreted this ECG.  Date: 07/25/2017  Rhythm: normal sinus rhythm QRS Axis: normal Intervals: normal ST/T Wave abnormalities: normal Narrative  Interpretation: no evidence of acute ischemia  ____________________________________________  RADIOLOGY  Chest x-ray shows mild vascular congestion ____________________________________________   PROCEDURES  Procedure(s) performed: No  Procedures   Critical Care performed: No ____________________________________________   INITIAL IMPRESSION / ASSESSMENT AND PLAN / ED COURSE  Pertinent labs & imaging results that were available during my care of the patient were reviewed by me and considered in my medical decision making (see chart for details).  Looking back at records it appears the patient had a mildly elevated d-dimer for which he was sent to the emergency department.  Given chest x-ray shows some mild vascular congestion this is likely the cause of his elevated d-dimer and his shortness of breath however given the patient's description of chest pain and mild tachycardia upon arrival we will obtain CT angiography to rule out PE. ----------------------------------------- 4:29 PM on 07/25/2017 -----------------------------------------   CT angiography unremarkable will refer the patient to CHF clinic for further evaluation of vascular congestion    ____________________________________________   FINAL CLINICAL IMPRESSION(S) / ED DIAGNOSES  Final diagnoses:  Shortness of breath  Pulmonary vascular congestion        Note:  This document was prepared using Dragon voice recognition software and may include unintentional dictation errors.    Lavonia Drafts, MD 07/25/17 [phone]

## 2017-07-25 NOTE — ED Triage Notes (Signed)
Pt went to walk-in clinic for fatigue and weakness - walk-in called and advised him that he had "abnormal labs and needs a CT scan" - pt is now c/o shortness of breath, weakness, and chest pain

## 2017-07-28 ENCOUNTER — Ambulatory Visit: Payer: Medicare Other | Attending: Family | Admitting: Family

## 2017-07-28 ENCOUNTER — Encounter: Payer: Self-pay | Admitting: Family

## 2017-07-28 DIAGNOSIS — I130 Hypertensive heart and chronic kidney disease with heart failure and stage 1 through stage 4 chronic kidney disease, or unspecified chronic kidney disease: Secondary | ICD-10-CM | POA: Insufficient documentation

## 2017-07-28 DIAGNOSIS — F329 Major depressive disorder, single episode, unspecified: Secondary | ICD-10-CM | POA: Insufficient documentation

## 2017-07-28 DIAGNOSIS — F419 Anxiety disorder, unspecified: Secondary | ICD-10-CM | POA: Insufficient documentation

## 2017-07-28 DIAGNOSIS — F209 Schizophrenia, unspecified: Secondary | ICD-10-CM

## 2017-07-28 DIAGNOSIS — M1990 Unspecified osteoarthritis, unspecified site: Secondary | ICD-10-CM | POA: Diagnosis not present

## 2017-07-28 DIAGNOSIS — Z9889 Other specified postprocedural states: Secondary | ICD-10-CM | POA: Insufficient documentation

## 2017-07-28 DIAGNOSIS — R0602 Shortness of breath: Secondary | ICD-10-CM | POA: Diagnosis not present

## 2017-07-28 DIAGNOSIS — Z833 Family history of diabetes mellitus: Secondary | ICD-10-CM | POA: Diagnosis not present

## 2017-07-28 DIAGNOSIS — E7800 Pure hypercholesterolemia, unspecified: Secondary | ICD-10-CM | POA: Diagnosis not present

## 2017-07-28 DIAGNOSIS — Z7982 Long term (current) use of aspirin: Secondary | ICD-10-CM | POA: Diagnosis not present

## 2017-07-28 DIAGNOSIS — Z7984 Long term (current) use of oral hypoglycemic drugs: Secondary | ICD-10-CM | POA: Insufficient documentation

## 2017-07-28 DIAGNOSIS — E1165 Type 2 diabetes mellitus with hyperglycemia: Secondary | ICD-10-CM | POA: Diagnosis not present

## 2017-07-28 DIAGNOSIS — R42 Dizziness and giddiness: Secondary | ICD-10-CM | POA: Diagnosis not present

## 2017-07-28 DIAGNOSIS — Z888 Allergy status to other drugs, medicaments and biological substances status: Secondary | ICD-10-CM | POA: Diagnosis not present

## 2017-07-28 DIAGNOSIS — G4733 Obstructive sleep apnea (adult) (pediatric): Secondary | ICD-10-CM | POA: Insufficient documentation

## 2017-07-28 DIAGNOSIS — Z79899 Other long term (current) drug therapy: Secondary | ICD-10-CM | POA: Insufficient documentation

## 2017-07-28 DIAGNOSIS — E119 Type 2 diabetes mellitus without complications: Secondary | ICD-10-CM

## 2017-07-28 DIAGNOSIS — Z8249 Family history of ischemic heart disease and other diseases of the circulatory system: Secondary | ICD-10-CM | POA: Insufficient documentation

## 2017-07-28 DIAGNOSIS — E1122 Type 2 diabetes mellitus with diabetic chronic kidney disease: Secondary | ICD-10-CM | POA: Diagnosis not present

## 2017-07-28 DIAGNOSIS — I5032 Chronic diastolic (congestive) heart failure: Secondary | ICD-10-CM | POA: Diagnosis not present

## 2017-07-28 DIAGNOSIS — I10 Essential (primary) hypertension: Secondary | ICD-10-CM

## 2017-07-28 DIAGNOSIS — R0789 Other chest pain: Secondary | ICD-10-CM | POA: Insufficient documentation

## 2017-07-28 DIAGNOSIS — Z794 Long term (current) use of insulin: Secondary | ICD-10-CM

## 2017-07-28 DIAGNOSIS — N189 Chronic kidney disease, unspecified: Secondary | ICD-10-CM | POA: Insufficient documentation

## 2017-07-28 LAB — GLUCOSE, CAPILLARY: Glucose-Capillary: 160 mg/dL — ABNORMAL HIGH (ref 65–99)

## 2017-07-28 MED ORDER — FUROSEMIDE 20 MG PO TABS: 10.0000 mg | ORAL_TABLET | Freq: Every day | ORAL | 5 refills | Status: DC

## 2017-07-28 NOTE — Progress Notes (Signed)
Patient ID: Danny Cook., male    DOB: 1960-07-11, 57 y.o.   MRN: [id number]  HPI  [name] is a 57 y/o male with a history of DM, hyperlipidemia, HTN, CKD, HIV, schizophrenia, obstructive sleep apnea, anxiety, osteoarthritis and chronic heart failure.   Echo report from 05/11/17 reviewed and showed an EF of 55% along with mild AR/ TR/ MR.  Was in the ED 07/24/17 due to shortness of breath. D-dimer slightly elevated. CT angiography was negative for PE. Released home. Was in the ED 04/26/17 due to hyperglycemia where he was treated and released.  He presents today for his initial visit with a chief complaint of minimal shortness of breath upon moderate exertion. He says that he's noticed this over the last several months. Has associated fatigue, chest tightness, light-headedness, depression, anxiety and difficulty sleeping. He denies any chest pain, edema, palpitations or abdominal distention.    Past Medical History:  Diagnosis Date  . Anemia   . Anxiety   . Arthritis   . CHF (congestive heart failure) ([place])   . Chronic kidney disease    Renal Insufficiency Syndrome; Glomerulosclerosis 2013  . Complication of anesthesia   . Depressed   . Diabetes mellitus without complication ([place])   . High cholesterol   . HIV (human immunodeficiency virus infection) ([place])   . Hypertension   . 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 Paranoid disorder ([place])   . Schizophrenia, paranoid ([place])   . Sleep apnea    Past Surgical History:  Procedure Laterality Date  . COLONOSCOPY WITH PROPOFOL N/A 09/03/2015   Procedure: COLONOSCOPY WITH PROPOFOL;  Surgeon: Lollie Sails, MD;  Location: [facility] ENDOSCOPY;  Service: Endoscopy;  Laterality: N/A;  . DG TEETH FULL    . ESOPHAGOGASTRODUODENOSCOPY (EGD) WITH PROPOFOL N/A 07/01/2016   Procedure: ESOPHAGOGASTRODUODENOSCOPY (EGD) WITH PROPOFOL;  Surgeon: Lollie Sails, MD;  Location: [facility] ENDOSCOPY;  Service: Endoscopy;  Laterality: N/A;   Family History  Problem  Relation Age of Onset  . Heart attack Mother   . Diabetes Mellitus II Mother   . Mental illness Mother   . CAD Mother   . Heart attack Father   . CAD Father   . Hypertension Father    Social History   Tobacco Use  . Smoking status: Never Smoker  . Smokeless tobacco: Never Used  Substance Use Topics  . Alcohol use: No   Allergies  Allergen Reactions  . Trazodone And Nefazodone   . Viread [Tenofovir Disoproxil]     Damages kidneys  . Etodolac Rash   Prior to Admission medications   Medication Sig Start Date End Date Taking? Authorizing Provider  Abacavir-Dolutegravir-Lamivud (TRIUMEQ) 600-50-300 MG TABS Take 1 tablet by mouth daily.   Yes [provider]  amitriptyline (ELAVIL) 25 MG tablet Take 1 tablet by mouth at bedtime. 11/12/16  Yes [provider]  aspirin 325 MG tablet Take 325 mg by mouth daily.   Yes [provider]  B Complex Vitamins (VITAMIN B COMPLEX PO) Take 1 tablet by mouth daily.   Yes [provider]  clonazePAM (KLONOPIN) 2 MG tablet Take 2 mg by mouth 2 (two) times daily.   Yes [provider]  divalproex (DEPAKOTE ER) 250 MG 24 hr tablet Take 250 mg by mouth daily.   Yes [provider]  FANAPT 10 MG TABS Take 12 mg by mouth 2 (two) times daily.  11/14/16  Yes [provider]  fluticasone (FLONASE) 50 MCG/ACT nasal spray  Place 2 sprays into the nose at bedtime. 12/11/16 12/11/17 Yes [provider]  gabapentin (NEURONTIN) 300 MG capsule Take 300 mg by mouth 3 (three) times daily. 08/21/16  Yes [provider]  gemfibrozil (LOPID) 600 MG tablet Take 1 tablet by mouth 2 (two) times daily. 11/12/16  Yes [provider]  glimepiride (AMARYL) 4 MG tablet Take 4 mg by mouth daily. 08/21/16  Yes [provider]  glucose 4 GM chewable tablet Chew 4 tablets by mouth as needed. 12/16/16 12/16/17 Yes [provider]  Insulin Degludec 200 UNIT/ML SOPN Inject 40 Units into  the skin daily. 12/16/16  Yes [provider]  liraglutide 18 MG/3ML SOPN Inject 1.8 mg into the skin daily. 11/06/16 11/06/17 Yes [provider]  lisinopril (PRINIVIL,ZESTRIL) 20 MG tablet Take 20 mg by mouth daily.   Yes [provider]  meclizine (ANTIVERT) 25 MG tablet Take 1 tablet (25 mg total) by mouth 3 (three) times daily as needed for dizziness or nausea. 04/12/16  Yes Carrie Mew, MD  metFORMIN (GLUCOPHAGE) 1000 MG tablet Take 1,000 mg by mouth 2 (two) times daily with a meal.   Yes [provider]  mirtazapine (REMERON) 30 MG tablet Take 30 mg by mouth at bedtime.   Yes [provider]  Multiple Vitamins-Minerals (MULTIVITAMIN WITH MINERALS) tablet Take 1 tablet by mouth daily.   Yes [provider]  omeprazole (PRILOSEC) 40 MG capsule Take 40 mg by mouth 2 (two) times daily.   Yes [provider]  ondansetron (ZOFRAN ODT) 4 MG disintegrating tablet Take 1 tablet (4 mg total) by mouth every 8 (eight) hours as needed for nausea or vomiting. 04/12/16  Yes Carrie Mew, MD  promethazine (PHENERGAN) 6.25 MG/5ML syrup Take 6.25 mg by mouth every 6 (six) hours as needed for nausea or vomiting.   Yes [provider]  rosuvastatin (CRESTOR) 20 MG tablet Take 20 mg by mouth daily.   Yes [provider]  triamcinolone cream (KENALOG) 0.1 % Apply 1 application topically 2 (two) times daily as needed. 11/16/16  Yes Little, Traci M, PA-C    Review of Systems  Constitutional: Positive for fatigue (improving). Negative for appetite change.  HENT: Negative for congestion, postnasal drip and sore throat.   Eyes: Negative.   Respiratory: Positive for chest tightness (2 days ago) and shortness of breath (with moderate exertion). Negative for cough.   Cardiovascular: Negative for chest pain, palpitations and leg swelling.  Gastrointestinal: Negative for abdominal distention and abdominal pain.  Endocrine: Negative.    Genitourinary: Negative.   Musculoskeletal: Positive for back pain. Negative for neck pain.  Skin: Negative.   Allergic/Immunologic: Negative.   Neurological: Positive for light-headedness. Negative for dizziness.  Hematological: Negative for adenopathy. Does not bruise/bleed easily.  Psychiatric/Behavioral: Positive for dysphoric mood and sleep disturbance (waking up every few hours). The patient is nervous/anxious.    Vitals:   07/28/17 1123  BP: (!) 143/84  Pulse: 80  Resp: 18  SpO2: 99%  Weight: 223 lb 8 oz (101.4 kg)  Height: 5\' 7"  (1.702 m)   Wt Readings from Last 3 Encounters:  07/28/17 223 lb 8 oz (101.4 kg)  07/25/17 220 lb (99.8 kg)  04/26/17 215 lb (97.5 kg)   Lab Results  Component Value Date   CREATININE 1.22 07/25/2017   CREATININE 1.21 04/26/2017   CREATININE 1.50 (H) 10/27/2016    Physical Exam  Constitutional: He is oriented to person, place, and time. He appears well-developed and  well-nourished.  HENT:  Head: Normocephalic and atraumatic.  Neck: Normal range of motion. Neck supple. No JVD present.  Cardiovascular: Normal rate and regular rhythm.  Pulmonary/Chest: Effort normal. He has no wheezes. He has no rales.  Abdominal: Soft. He exhibits no distension. There is no tenderness.  Musculoskeletal: He exhibits no edema or tenderness.  Neurological: He is alert and oriented to person, place, and time.  Skin: Skin is warm and dry.  Psychiatric: He has a normal mood and affect. His behavior is normal. Thought content normal.  Nursing note and vitals reviewed.  Assessment & Plan:  1: Chronic heart failure with preserved ejection fraction- - NYHA class II - euvolemic today - does not have scales so a set of scales was given to him. Instructed to call for an overnight weight gain of >2 pounds or a weekly weight gain of >5 pounds - not adding salt to his food. Written dietary information given to him and instructed to keep sodium intake to 2000mg  a  day - saw cardiology ([place]) 04/08/17 - patient reports receiving his flu vaccine for this season - will add furosemide 10mg  daily. He is to cut the 20mg  tablet in half. Had fluid show up on his chest xray in the ER.   2: HTN- - BP looks good today - BMP from 07/25/17 reviewed and showed sodium 140, potassium 4.4 and GFR >60 - saw PCP Kary Kos) 07/25/17  3: Diabetes- - nonfasting glucose in clinic was 160. Had eaten banana, sausage biscuit and sugar free drink - A1c 07/14/17 was 8.0% - saw endocrinologist Jenetta DownerDina Rich) 07/15/17 - saw nephrologist ([place]) 06/16/17  4: Schizophrenia- - sees psychiatrist  Medication bottles were reviewed.  Return in 1 month or sooner for any questions/problems before then. Will get a BMP at that time since low dose furosemide was started.

## 2017-07-28 NOTE — Patient Instructions (Addendum)
Begin weighing daily and call for an overnight weight gain of > 2 pounds or a weekly weight gain of >5 pounds.  Begin taking furosemide (lasix). Take 1/2 tablet daily in the morning

## 2017-07-29 DIAGNOSIS — I10 Essential (primary) hypertension: Secondary | ICD-10-CM | POA: Insufficient documentation

## 2017-07-29 DIAGNOSIS — I5032 Chronic diastolic (congestive) heart failure: Secondary | ICD-10-CM | POA: Insufficient documentation

## 2017-07-29 DIAGNOSIS — E119 Type 2 diabetes mellitus without complications: Secondary | ICD-10-CM | POA: Insufficient documentation

## 2017-07-29 DIAGNOSIS — F209 Schizophrenia, unspecified: Secondary | ICD-10-CM | POA: Insufficient documentation

## 2017-07-30 ENCOUNTER — Inpatient Hospital Stay
Admission: EM | Admit: 2017-07-30 | Discharge: 2017-08-01 | DRG: 977 | Disposition: A | Discharge status: Home Health | Payer: Medicare Other | Attending: Internal Medicine | Admitting: Internal Medicine

## 2017-07-30 ENCOUNTER — Other Ambulatory Visit: Payer: Self-pay

## 2017-07-30 ENCOUNTER — Encounter: Payer: Self-pay | Admitting: Emergency Medicine

## 2017-07-30 DIAGNOSIS — R42 Dizziness and giddiness: Secondary | ICD-10-CM

## 2017-07-30 DIAGNOSIS — E785 Hyperlipidemia, unspecified: Secondary | ICD-10-CM | POA: Diagnosis not present

## 2017-07-30 DIAGNOSIS — N179 Acute kidney failure, unspecified: Secondary | ICD-10-CM | POA: Diagnosis not present

## 2017-07-30 DIAGNOSIS — Z79899 Other long term (current) drug therapy: Secondary | ICD-10-CM

## 2017-07-30 DIAGNOSIS — Z23 Encounter for immunization: Secondary | ICD-10-CM | POA: Diagnosis not present

## 2017-07-30 DIAGNOSIS — G4730 Sleep apnea, unspecified: Secondary | ICD-10-CM | POA: Diagnosis present

## 2017-07-30 DIAGNOSIS — Z794 Long term (current) use of insulin: Secondary | ICD-10-CM

## 2017-07-30 DIAGNOSIS — I5032 Chronic diastolic (congestive) heart failure: Secondary | ICD-10-CM | POA: Diagnosis not present

## 2017-07-30 DIAGNOSIS — I951 Orthostatic hypotension: Secondary | ICD-10-CM | POA: Diagnosis present

## 2017-07-30 DIAGNOSIS — F419 Anxiety disorder, unspecified: Secondary | ICD-10-CM | POA: Diagnosis not present

## 2017-07-30 DIAGNOSIS — F200 Paranoid schizophrenia: Secondary | ICD-10-CM | POA: Diagnosis present

## 2017-07-30 DIAGNOSIS — F329 Major depressive disorder, single episode, unspecified: Secondary | ICD-10-CM | POA: Diagnosis present

## 2017-07-30 DIAGNOSIS — Z7982 Long term (current) use of aspirin: Secondary | ICD-10-CM | POA: Diagnosis not present

## 2017-07-30 DIAGNOSIS — B20 Human immunodeficiency virus [HIV] disease: Secondary | ICD-10-CM | POA: Diagnosis not present

## 2017-07-30 DIAGNOSIS — E7800 Pure hypercholesterolemia, unspecified: Secondary | ICD-10-CM | POA: Diagnosis not present

## 2017-07-30 DIAGNOSIS — E860 Dehydration: Secondary | ICD-10-CM | POA: Diagnosis not present

## 2017-07-30 DIAGNOSIS — H6691 Otitis media, unspecified, right ear: Secondary | ICD-10-CM | POA: Diagnosis not present

## 2017-07-30 DIAGNOSIS — I110 Hypertensive heart disease with heart failure: Secondary | ICD-10-CM | POA: Diagnosis not present

## 2017-07-30 DIAGNOSIS — E1140 Type 2 diabetes mellitus with diabetic neuropathy, unspecified: Secondary | ICD-10-CM | POA: Diagnosis not present

## 2017-07-30 LAB — CBC WITH DIFFERENTIAL/PLATELET
Basophils Absolute: 0.1 10*3/uL (ref 0–0.1)
Basophils Relative: 1 %
EOS ABS: 0.1 10*3/uL (ref 0–0.7)
Eosinophils Relative: 2 %
HEMATOCRIT: 38 % — AB (ref 40.0–52.0)
HEMOGLOBIN: 13.1 g/dL (ref 13.0–18.0)
LYMPHS ABS: 2.7 10*3/uL (ref 1.0–3.6)
LYMPHS PCT: 35 %
MCH: 33.1 pg (ref 26.0–34.0)
MCHC: 34.5 g/dL (ref 32.0–36.0)
MCV: 96.1 fL (ref 80.0–100.0)
MONOS PCT: 8 %
Monocytes Absolute: 0.6 10*3/uL (ref 0.2–1.0)
NEUTROS ABS: 4.2 10*3/uL (ref 1.4–6.5)
NEUTROS PCT: 54 %
Platelets: 267 10*3/uL (ref 150–440)
RBC: 3.95 MIL/uL — ABNORMAL LOW (ref 4.40–5.90)
RDW: 13.4 % (ref 11.5–14.5)
WBC: 7.8 10*3/uL (ref 3.8–10.6)

## 2017-07-30 LAB — TROPONIN I: Troponin I: <0.03 ng/mL (ref <0.03)

## 2017-07-30 LAB — URINALYSIS, COMPLETE (UACMP) WITH MICROSCOPIC
BILIRUBIN URINE: NEGATIVE
Bacteria, UA: NONE SEEN
GLUCOSE, UA: NEGATIVE mg/dL
Ketones, ur: NEGATIVE mg/dL
Leukocytes, UA: NEGATIVE
Nitrite: NEGATIVE
Protein, ur: 30 mg/dL — AB
SPECIFIC GRAVITY, URINE: 1.01 (ref 1.005–1.030)
Squamous Epithelial / LPF: NONE SEEN
pH: 5 (ref 5.0–8.0)

## 2017-07-30 LAB — BASIC METABOLIC PANEL
Anion gap: 14 (ref 5–15)
BUN: 31 mg/dL — AB (ref 6–20)
CHLORIDE: 105 mmol/L (ref 101–111)
CO2: 19 mmol/L — ABNORMAL LOW (ref 22–32)
CREATININE: 1.63 mg/dL — AB (ref 0.61–1.24)
Calcium: 10 mg/dL (ref 8.9–10.3)
GFR calc Af Amer: 52 mL/min — ABNORMAL LOW (ref >60)
GFR calc non Af Amer: 45 mL/min — ABNORMAL LOW (ref >60)
Glucose, Bld: 169 mg/dL — ABNORMAL HIGH (ref 65–99)
Potassium: 4.4 mmol/L (ref 3.5–5.1)
Sodium: 138 mmol/L (ref 135–145)

## 2017-07-30 MED ORDER — SODIUM CHLORIDE 0.9 % IV BOLUS (SEPSIS)
500.0000 mL | Freq: Once | INTRAVENOUS | Status: AC
  Administered 2017-07-30: 500 mL via INTRAVENOUS

## 2017-07-30 MED ORDER — SODIUM CHLORIDE 0.9 % IV BOLUS (SEPSIS)
1000.0000 mL | Freq: Once | INTRAVENOUS | Status: AC
  Administered 2017-07-30: 1000 mL via INTRAVENOUS

## 2017-07-30 NOTE — ED Notes (Signed)
Danny Cook (brother) [phone]

## 2017-07-30 NOTE — ED Triage Notes (Signed)
Pt to ED via EMS from home with c/o dizziness since aprox 1300 today, pt hx of inner ear dizziness, but states this episode is worse. Pt A&OX4, speech clear, VS stable, MD at bedside

## 2017-07-30 NOTE — Discharge Instructions (Signed)
Please seek medical attention for any high fevers, chest pain, shortness of breath, change in behavior, persistent vomiting, bloody stool or any other new or concerning symptoms.  

## 2017-07-30 NOTE — ED Notes (Signed)
Pt stated that he has been taking fluid pill for the past 4 days and he has noticed that he has not been going to the bathroom enough. Mostly dribbling.

## 2017-07-30 NOTE — ED Provider Notes (Signed)
[facility] Emergency Department Provider Note   ____________________________________________   I have reviewed the triage vital signs and the nursing notes.   HISTORY  Chief Complaint Dizziness   History limited by: Not Limited   HPI Danny Cook. is a 57 y.o. male who presents to the emergency department today because of concerns for dizziness and lightheadedness.  Patient states the symptoms started this afternoon.  It started after the patient got home.  He states that he feels lightheaded and like he might pass out.  It is worse when he gets up and tries to walk around.  He has a history of inner ear disorder and tried taking 2 meclizine without any relief.  Today symptoms feel somewhat different than his normal inner ear symptoms.  He denies any associated chest pain shortness of breath or palpitations.  He denies any fevers.   Per medical record review patient has a history of ER visit 5 days ago  Past Medical History:  Diagnosis Date  . Anemia   . Anxiety   . Arthritis   . CHF (congestive heart failure) ([place])   . Chronic kidney disease    Renal Insufficiency Syndrome; Glomerulosclerosis 2013  . Complication of anesthesia   . Depressed   . Diabetes mellitus without complication ([place])   . High cholesterol   . HIV (human immunodeficiency virus infection) ([place])   . Hypertension   . 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 Paranoid disorder ([place])   . Schizophrenia, paranoid ([place])   . Sleep apnea     Patient Active Problem List   Diagnosis Date Noted  . Chronic diastolic heart failure ([place]) 07/29/2017  . HTN (hypertension) 07/29/2017  . Diabetes ([place]) 07/29/2017  . Schizophrenia ([place]) 07/29/2017    Past Surgical History:  Procedure Laterality Date  . COLONOSCOPY WITH PROPOFOL N/A 09/03/2015   Procedure: COLONOSCOPY WITH PROPOFOL;  Surgeon: Lollie Sails, MD;  Location: [facility] ENDOSCOPY;  Service: Endoscopy;  Laterality: N/A;  . DG TEETH FULL     . ESOPHAGOGASTRODUODENOSCOPY (EGD) WITH PROPOFOL N/A 07/01/2016   Procedure: ESOPHAGOGASTRODUODENOSCOPY (EGD) WITH PROPOFOL;  Surgeon: Lollie Sails, MD;  Location: [facility] ENDOSCOPY;  Service: Endoscopy;  Laterality: N/A;    Prior to Admission medications   Medication Sig Start Date End Date Taking? Authorizing Provider  Abacavir-Dolutegravir-Lamivud (TRIUMEQ) 600-50-300 MG TABS Take 1 tablet by mouth daily.    [provider]  amitriptyline (ELAVIL) 25 MG tablet Take 1 tablet by mouth at bedtime. 11/12/16   [provider]  aspirin 325 MG tablet Take 325 mg by mouth daily.    [provider]  B Complex Vitamins (VITAMIN B COMPLEX PO) Take 1 tablet by mouth daily.    [provider]  clonazePAM (KLONOPIN) 2 MG tablet Take 2 mg by mouth 2 (two) times daily.    [provider]  divalproex (DEPAKOTE ER) 250 MG 24 hr tablet Take 250 mg by mouth daily.    [provider]  FANAPT 10 MG TABS Take 12 mg by mouth 2 (two) times daily.  11/14/16   [provider]  fluticasone (FLONASE) 50 MCG/ACT nasal spray Place 2 sprays into the nose at bedtime. 12/11/16 12/11/17  [provider]  furosemide (LASIX) 20 MG tablet Take 0.5 tablets (10 mg total) by mouth daily. 07/28/17   Alisa Graff, FNP  gabapentin (NEURONTIN) 300 MG capsule Take 300 mg by mouth 3 (three) times daily. 08/21/16   [provider]  gemfibrozil (LOPID) 600 MG tablet Take 1 tablet by mouth 2 (two) times daily. 11/12/16   [provider]  glimepiride (AMARYL) 4 MG tablet Take 4 mg by mouth daily. 08/21/16   [provider]  glucose 4 GM chewable tablet Chew 4 tablets by mouth as needed. 12/16/16 12/16/17  [provider]  Insulin Degludec 200 UNIT/ML SOPN Inject 40 Units into the skin daily. 12/16/16   [provider]  liraglutide 18 MG/3ML SOPN Inject 1.8 mg into the skin daily. 11/06/16 11/06/17  [provider]  lisinopril  (PRINIVIL,ZESTRIL) 20 MG tablet Take 20 mg by mouth daily.    [provider]  meclizine (ANTIVERT) 25 MG tablet Take 1 tablet (25 mg total) by mouth 3 (three) times daily as needed for dizziness or nausea. 04/12/16   Carrie Mew, MD  metFORMIN (GLUCOPHAGE) 1000 MG tablet Take 1,000 mg by mouth 2 (two) times daily with a meal.    [provider]  mirtazapine (REMERON) 30 MG tablet Take 30 mg by mouth at bedtime.    [provider]  Multiple Vitamins-Minerals (MULTIVITAMIN WITH MINERALS) tablet Take 1 tablet by mouth daily.    [provider]  omeprazole (PRILOSEC) 40 MG capsule Take 40 mg by mouth 2 (two) times daily.    [provider]  ondansetron (ZOFRAN ODT) 4 MG disintegrating tablet Take 1 tablet (4 mg total) by mouth every 8 (eight) hours as needed for nausea or vomiting. 04/12/16   Carrie Mew, MD  promethazine (PHENERGAN) 6.25 MG/5ML syrup Take 6.25 mg by mouth every 6 (six) hours as needed for nausea or vomiting.    [provider]  rosuvastatin (CRESTOR) 20 MG tablet Take 20 mg by mouth daily.    [provider]  triamcinolone cream (KENALOG) 0.1 % Apply 1 application topically 2 (two) times daily as needed. 11/16/16   Little, Traci M, PA-C    Allergies Trazodone and nefazodone; Viread [tenofovir disoproxil]; and Etodolac  Family History  Problem Relation Age of Onset  . Heart attack Mother   . Diabetes Mellitus II Mother   . Mental illness Mother   . CAD Mother   . Heart attack Father   . CAD Father   . Hypertension Father     Social History Social History   Tobacco Use  . Smoking status: Never Smoker  . Smokeless tobacco: Never Used  Substance Use Topics  . Alcohol use: No  . Drug use: No    Review of Systems Constitutional: No fever/chills Eyes: No visual changes. ENT: No sore throat. Cardiovascular: Denies chest pain. Respiratory: Denies shortness of breath. Gastrointestinal: No  abdominal pain.  No nausea, no vomiting.  No diarrhea.   Genitourinary: Negative for dysuria. Musculoskeletal: Negative for back pain. Skin: Negative for rash. Neurological: Positive for dizziness. ____________________________________________   PHYSICAL EXAM:  VITAL SIGNS: ED Triage Vitals  Enc Vitals Group     BP 07/30/17 1800 104/77     Pulse Rate 07/30/17 1800 87     Resp 07/30/17 1800 16     Temp 07/30/17 1800 98.7 F (37.1 C)     Temp Source 07/30/17 1800 Oral     SpO2 07/30/17 1800 98 %     Weight --      Height --      Head Circumference --      Peak Flow --      Pain Score 07/30/17 1801 0   Constitutional: Alert and oriented. Well appearing and in  no distress. Eyes: Conjunctivae are normal.  ENT   Head: Normocephalic and atraumatic.   Nose: No congestion/rhinnorhea.   Mouth/Throat: Mucous membranes are moist.   Neck: No stridor. Hematological/Lymphatic/Immunilogical: No cervical lymphadenopathy. Cardiovascular: Normal rate, regular rhythm.  No murmurs, rubs, or gallops.  Respiratory: Normal respiratory effort without tachypnea nor retractions. Breath sounds are clear and equal bilaterally. No wheezes/rales/rhonchi. Gastrointestinal: Soft and non tender. No rebound. No guarding.  Genitourinary: Deferred Musculoskeletal: Normal range of motion in all extremities. No lower extremity edema. Neurologic:  Normal speech and language. No gross focal neurologic deficits are appreciated.  Skin:  Skin is warm, dry and intact. No rash noted. Psychiatric: Mood and affect are normal. Speech and behavior are normal. Patient exhibits appropriate insight and judgment.  ____________________________________________    LABS (pertinent positives/negatives)  Trop <0.03 UA not consistent with infection CBC wbc 7.8, hgb 13.1, plt 267 BMP glu 169, cr 1.63, k 4.4 ____________________________________________   EKG  I, Nance Pear, attending physician, personally  viewed and interpreted this EKG  EKG Time: 1808 Rate: 87 Rhythm: sinus rhythm Axis: normal Intervals: qtc 419 QRS: narrow ST changes: no st elevation Impression: normal ekg   ____________________________________________    RADIOLOGY  None  ____________________________________________   PROCEDURES  Procedures  ____________________________________________   INITIAL IMPRESSION / ASSESSMENT AND PLAN / ED COURSE  Pertinent labs & imaging results that were available during my care of the patient were reviewed by me and considered in my medical decision making (see chart for details).  Patient presented to the emergency department today because of concern for lightheadedness. Ddx would be broad including infection, anemia, dehydration amongst other etiologies. Patient was found to be orthostatic. No concerning signs of infection on blood work. Patient will be given ivfs and reassessed.    ____________________________________________   FINAL CLINICAL IMPRESSION(S) / ED DIAGNOSES  Final diagnoses:  Dehydration  Lightheadedness     Note: This dictation was prepared with Dragon dictation. Any transcriptional errors that result from this process are unintentional     Nance Pear, MD 07/31/17 1446

## 2017-07-31 ENCOUNTER — Inpatient Hospital Stay
Admit: 2017-07-31 | Discharge: 2017-07-31 | Disposition: A | Discharge status: Home / Self Care | Payer: Medicare Other | Attending: Internal Medicine | Admitting: Internal Medicine

## 2017-07-31 ENCOUNTER — Other Ambulatory Visit: Payer: Self-pay

## 2017-07-31 ENCOUNTER — Inpatient Hospital Stay: Payer: Medicare Other

## 2017-07-31 DIAGNOSIS — F200 Paranoid schizophrenia: Secondary | ICD-10-CM | POA: Diagnosis not present

## 2017-07-31 DIAGNOSIS — E785 Hyperlipidemia, unspecified: Secondary | ICD-10-CM | POA: Diagnosis not present

## 2017-07-31 DIAGNOSIS — Z79899 Other long term (current) drug therapy: Secondary | ICD-10-CM | POA: Diagnosis not present

## 2017-07-31 DIAGNOSIS — I951 Orthostatic hypotension: Secondary | ICD-10-CM | POA: Diagnosis not present

## 2017-07-31 DIAGNOSIS — F329 Major depressive disorder, single episode, unspecified: Secondary | ICD-10-CM | POA: Diagnosis not present

## 2017-07-31 DIAGNOSIS — Z794 Long term (current) use of insulin: Secondary | ICD-10-CM | POA: Diagnosis not present

## 2017-07-31 DIAGNOSIS — I110 Hypertensive heart disease with heart failure: Secondary | ICD-10-CM | POA: Diagnosis not present

## 2017-07-31 DIAGNOSIS — R42 Dizziness and giddiness: Secondary | ICD-10-CM

## 2017-07-31 DIAGNOSIS — F419 Anxiety disorder, unspecified: Secondary | ICD-10-CM | POA: Diagnosis not present

## 2017-07-31 DIAGNOSIS — I5032 Chronic diastolic (congestive) heart failure: Secondary | ICD-10-CM | POA: Diagnosis not present

## 2017-07-31 DIAGNOSIS — N179 Acute kidney failure, unspecified: Secondary | ICD-10-CM | POA: Diagnosis not present

## 2017-07-31 DIAGNOSIS — E1140 Type 2 diabetes mellitus with diabetic neuropathy, unspecified: Secondary | ICD-10-CM | POA: Diagnosis not present

## 2017-07-31 DIAGNOSIS — B20 Human immunodeficiency virus [HIV] disease: Secondary | ICD-10-CM | POA: Diagnosis not present

## 2017-07-31 DIAGNOSIS — Z7982 Long term (current) use of aspirin: Secondary | ICD-10-CM | POA: Diagnosis not present

## 2017-07-31 DIAGNOSIS — Z23 Encounter for immunization: Secondary | ICD-10-CM | POA: Diagnosis not present

## 2017-07-31 DIAGNOSIS — E7800 Pure hypercholesterolemia, unspecified: Secondary | ICD-10-CM | POA: Diagnosis not present

## 2017-07-31 DIAGNOSIS — H6691 Otitis media, unspecified, right ear: Secondary | ICD-10-CM | POA: Diagnosis not present

## 2017-07-31 DIAGNOSIS — E860 Dehydration: Secondary | ICD-10-CM | POA: Diagnosis present

## 2017-07-31 DIAGNOSIS — G4730 Sleep apnea, unspecified: Secondary | ICD-10-CM | POA: Diagnosis not present

## 2017-07-31 LAB — BASIC METABOLIC PANEL
Anion gap: 11 (ref 5–15)
BUN: 26 mg/dL — AB (ref 6–20)
CHLORIDE: 106 mmol/L (ref 101–111)
CO2: 23 mmol/L (ref 22–32)
CREATININE: 1.33 mg/dL — AB (ref 0.61–1.24)
Calcium: 9.6 mg/dL (ref 8.9–10.3)
GFR calc non Af Amer: 57 mL/min — ABNORMAL LOW (ref >60)
Glucose, Bld: 77 mg/dL (ref 65–99)
Potassium: 3.8 mmol/L (ref 3.5–5.1)
Sodium: 140 mmol/L (ref 135–145)

## 2017-07-31 LAB — GLUCOSE, CAPILLARY
GLUCOSE-CAPILLARY: 97 mg/dL (ref 65–99)
Glucose-Capillary: 140 mg/dL — ABNORMAL HIGH (ref 65–99)
Glucose-Capillary: 109 mg/dL — ABNORMAL HIGH (ref 65–99)
Glucose-Capillary: 108 mg/dL — ABNORMAL HIGH (ref 65–99)

## 2017-07-31 LAB — CBC
HCT: 37.9 % — ABNORMAL LOW (ref 40.0–52.0)
Hemoglobin: 12.9 g/dL — ABNORMAL LOW (ref 13.0–18.0)
MCH: 32.6 pg (ref 26.0–34.0)
MCHC: 34.1 g/dL (ref 32.0–36.0)
MCV: 95.6 fL (ref 80.0–100.0)
PLATELETS: 259 10*3/uL (ref 150–440)
RBC: 3.97 MIL/uL — AB (ref 4.40–5.90)
RDW: 13.5 % (ref 11.5–14.5)
WBC: 8.1 10*3/uL (ref 3.8–10.6)

## 2017-07-31 MED ORDER — PANTOPRAZOLE SODIUM 40 MG PO TBEC
40.0000 mg | DELAYED_RELEASE_TABLET | Freq: Every day | ORAL | Status: DC
  Administered 2017-07-31 – 2017-08-01 (×2): 40 mg via ORAL
  Filled 2017-07-31 (×2): qty 1

## 2017-07-31 MED ORDER — ACETAMINOPHEN 650 MG RE SUPP: 650.0000 mg | Freq: Four times a day (QID) | RECTAL | Status: DC | PRN

## 2017-07-31 MED ORDER — AMOXICILLIN-POT CLAVULANATE 875-125 MG PO TABS
1.0000 | ORAL_TABLET | Freq: Two times a day (BID) | ORAL | Status: DC
  Administered 2017-07-31 – 2017-08-01 (×3): 1 via ORAL
  Filled 2017-07-31 (×3): qty 1

## 2017-07-31 MED ORDER — ONDANSETRON HCL 4 MG PO TABS
4.0000 mg | ORAL_TABLET | Freq: Four times a day (QID) | ORAL | Status: DC | PRN
Start: 2017-07-31 — End: 2017-08-01
  Administered 2017-07-31: 4 mg via ORAL
  Filled 2017-07-31: qty 1

## 2017-07-31 MED ORDER — SUCRALFATE 1 G PO TABS
1.0000 g | ORAL_TABLET | Freq: Two times a day (BID) | ORAL | Status: DC
  Administered 2017-07-31 – 2017-08-01 (×3): 1 g via ORAL
  Filled 2017-07-31 (×3): qty 1

## 2017-07-31 MED ORDER — GEMFIBROZIL 600 MG PO TABS
600.0000 mg | ORAL_TABLET | Freq: Two times a day (BID) | ORAL | Status: DC
  Administered 2017-07-31 – 2017-08-01 (×3): 600 mg via ORAL
  Filled 2017-07-31 (×4): qty 1

## 2017-07-31 MED ORDER — INSULIN GLARGINE 100 UNIT/ML ~~LOC~~ SOLN
20.0000 [IU] | Freq: Every day | SUBCUTANEOUS | Status: DC
  Administered 2017-07-31: 20 [IU] via SUBCUTANEOUS
  Filled 2017-07-31 (×2): qty 0.2

## 2017-07-31 MED ORDER — GABAPENTIN 300 MG PO CAPS
300.0000 mg | ORAL_CAPSULE | Freq: Three times a day (TID) | ORAL | Status: DC
  Administered 2017-07-31 – 2017-08-01 (×4): 300 mg via ORAL
  Filled 2017-07-31 (×4): qty 1

## 2017-07-31 MED ORDER — LIRAGLUTIDE 18 MG/3ML ~~LOC~~ SOPN: 1.8000 mg | PEN_INJECTOR | Freq: Every day | SUBCUTANEOUS | Status: DC

## 2017-07-31 MED ORDER — MECLIZINE HCL 25 MG PO TABS
25.0000 mg | ORAL_TABLET | Freq: Three times a day (TID) | ORAL | Status: DC | PRN
  Filled 2017-07-31: qty 1

## 2017-07-31 MED ORDER — HYDROCODONE-ACETAMINOPHEN 5-325 MG PO TABS
1.0000 | ORAL_TABLET | ORAL | Status: DC | PRN
  Administered 2017-07-31: 1 via ORAL
  Filled 2017-07-31: qty 1

## 2017-07-31 MED ORDER — SODIUM CHLORIDE 0.9 % IV BOLUS (SEPSIS)
500.0000 mL | Freq: Once | INTRAVENOUS | Status: AC
  Administered 2017-07-31: 500 mL via INTRAVENOUS

## 2017-07-31 MED ORDER — VITAMIN B-12 1000 MCG PO TABS
1000.0000 ug | ORAL_TABLET | Freq: Every day | ORAL | Status: DC
  Administered 2017-07-31 – 2017-08-01 (×2): 1000 ug via ORAL
  Filled 2017-07-31 (×2): qty 1

## 2017-07-31 MED ORDER — METFORMIN HCL 500 MG PO TABS
1000.0000 mg | ORAL_TABLET | Freq: Two times a day (BID) | ORAL | Status: DC
  Administered 2017-07-31 – 2017-08-01 (×3): 1000 mg via ORAL
  Filled 2017-07-31 (×3): qty 2

## 2017-07-31 MED ORDER — DOCUSATE SODIUM 100 MG PO CAPS
100.0000 mg | ORAL_CAPSULE | Freq: Two times a day (BID) | ORAL | Status: DC
  Administered 2017-07-31 – 2017-08-01 (×2): 100 mg via ORAL
  Filled 2017-07-31 (×2): qty 1

## 2017-07-31 MED ORDER — SODIUM CHLORIDE 0.9 % IV SOLN
INTRAVENOUS | Status: DC
  Administered 2017-07-31: 75 mL/h via INTRAVENOUS
  Administered 2017-07-31: 18:00:00 via INTRAVENOUS

## 2017-07-31 MED ORDER — CLONAZEPAM 0.5 MG PO TABS
2.0000 mg | ORAL_TABLET | Freq: Two times a day (BID) | ORAL | Status: DC
  Administered 2017-07-31 – 2017-08-01 (×3): 2 mg via ORAL
  Filled 2017-07-31 (×3): qty 4

## 2017-07-31 MED ORDER — ROSUVASTATIN CALCIUM 20 MG PO TABS
20.0000 mg | ORAL_TABLET | Freq: Every day | ORAL | Status: DC
  Administered 2017-07-31: 20 mg via ORAL
  Filled 2017-07-31 (×2): qty 1
  Filled 2017-07-31: qty 2

## 2017-07-31 MED ORDER — GLIMEPIRIDE 4 MG PO TABS
4.0000 mg | ORAL_TABLET | Freq: Every day | ORAL | Status: DC
  Administered 2017-07-31 – 2017-08-01 (×2): 4 mg via ORAL
  Filled 2017-07-31 (×2): qty 1

## 2017-07-31 MED ORDER — HEPARIN SODIUM (PORCINE) 5000 UNIT/ML IJ SOLN
5000.0000 [IU] | Freq: Three times a day (TID) | INTRAMUSCULAR | Status: DC
  Administered 2017-07-31 – 2017-08-01 (×4): 5000 [IU] via SUBCUTANEOUS
  Filled 2017-07-31 (×4): qty 1

## 2017-07-31 MED ORDER — PNEUMOCOCCAL VAC POLYVALENT 25 MCG/0.5ML IJ INJ
0.5000 mL | INJECTION | INTRAMUSCULAR | Status: AC
  Administered 2017-08-01: 0.5 mL via INTRAMUSCULAR
  Filled 2017-07-31: qty 0.5

## 2017-07-31 MED ORDER — INSULIN DEGLUDEC 200 UNIT/ML ~~LOC~~ SOPN: 40.0000 [IU] | PEN_INJECTOR | Freq: Every day | SUBCUTANEOUS | Status: DC

## 2017-07-31 MED ORDER — ACETAMINOPHEN 325 MG PO TABS
650.0000 mg | ORAL_TABLET | Freq: Four times a day (QID) | ORAL | Status: DC | PRN
Start: 2017-07-31 — End: 2017-08-01

## 2017-07-31 MED ORDER — AMITRIPTYLINE HCL 50 MG PO TABS
25.0000 mg | ORAL_TABLET | Freq: Every day | ORAL | Status: DC
  Administered 2017-07-31: 25 mg via ORAL
  Filled 2017-07-31: qty 0.5
  Filled 2017-07-31: qty 1
  Filled 2017-07-31: qty 0.5

## 2017-07-31 MED ORDER — MIRTAZAPINE 15 MG PO TABS
30.0000 mg | ORAL_TABLET | Freq: Every day | ORAL | Status: DC
  Administered 2017-07-31: 30 mg via ORAL
  Filled 2017-07-31: qty 2

## 2017-07-31 MED ORDER — ABACAVIR-DOLUTEGRAVIR-LAMIVUD 600-50-300 MG PO TABS
1.0000 | ORAL_TABLET | Freq: Every day | ORAL | Status: DC
  Administered 2017-07-31 – 2017-08-01 (×2): 1 via ORAL
  Filled 2017-07-31 (×2): qty 1

## 2017-07-31 MED ORDER — LISINOPRIL 10 MG PO TABS: 20.0000 mg | ORAL_TABLET | Freq: Every day | ORAL | Status: DC

## 2017-07-31 MED ORDER — ONDANSETRON HCL 4 MG/2ML IJ SOLN: 4.0000 mg | Freq: Four times a day (QID) | INTRAMUSCULAR | Status: DC | PRN

## 2017-07-31 MED ORDER — DIVALPROEX SODIUM ER 250 MG PO TB24
500.0000 mg | ORAL_TABLET | Freq: Two times a day (BID) | ORAL | Status: DC
  Administered 2017-07-31 – 2017-08-01 (×3): 500 mg via ORAL
  Filled 2017-07-31 (×5): qty 2

## 2017-07-31 MED ORDER — ASPIRIN 325 MG PO TABS
325.0000 mg | ORAL_TABLET | Freq: Every day | ORAL | Status: DC
  Administered 2017-07-31 – 2017-08-01 (×2): 325 mg via ORAL
  Filled 2017-07-31 (×2): qty 1

## 2017-07-31 MED ORDER — INSULIN ASPART 100 UNIT/ML ~~LOC~~ SOLN
0.0000 [IU] | Freq: Three times a day (TID) | SUBCUTANEOUS | Status: DC
  Administered 2017-07-31 – 2017-08-01 (×2): 2 [IU] via SUBCUTANEOUS
  Administered 2017-08-01: 5 [IU] via SUBCUTANEOUS
  Filled 2017-07-31 (×3): qty 1

## 2017-07-31 MED ORDER — ILOPERIDONE 4 MG PO TABS
12.0000 mg | ORAL_TABLET | Freq: Two times a day (BID) | ORAL | Status: DC
  Administered 2017-07-31 – 2017-08-01 (×3): 12 mg via ORAL
  Filled 2017-07-31 (×5): qty 3

## 2017-07-31 MED ORDER — BISACODYL 5 MG PO TBEC: 5.0000 mg | DELAYED_RELEASE_TABLET | Freq: Every day | ORAL | Status: DC | PRN

## 2017-07-31 MED ORDER — PROMETHAZINE HCL 6.25 MG/5ML PO SYRP
6.2500 mg | ORAL_SOLUTION | Freq: Four times a day (QID) | ORAL | Status: DC | PRN
  Filled 2017-07-31: qty 5

## 2017-07-31 NOTE — Progress Notes (Signed)
Patient ID: Danny Cook., male   DOB: April 16, 1961, 57 y.o.   MRN: [id number]  ACP note  Patient present for discussion.  Diagnosis: Dizziness with fall off the bed.  Other diagnosis: Sleep apnea, HIV, diabetes, depression, history of congestive heart failure.   Discussed CODE STATUS.  Patient would like to be a full code at this point in time.  Symptoms the patient having today dizziness, nausea, weakness, hit head with fall.  Plan for today.  Augmentin started for otitis media in the right ear.  Hopefully this is the cause of his dizziness.  Decrease rate of IV fluids with history of CHF.  Patient's blood pressure on the lower side on presentation and IV fluids given and lisinopril held.  Acute kidney injury on presentation IV fluids decreased.  CT scan of the head for head trauma with fall.  Physical therapy evaluation.  Time spent on ACP discussion 25 minutes  Dr. Loletha Grayer

## 2017-07-31 NOTE — Progress Notes (Signed)
*  PRELIMINARY RESULTS* Echocardiogram 2D Echocardiogram has been performed.  Danny Cook 07/31/2017, 3:37 PM

## 2017-07-31 NOTE — Evaluation (Signed)
Physical Therapy Evaluation Patient Details Name: Danny Cook. MRN: [id number] DOB: 1961-01-12 Today's Date: 07/31/2017   History of Present Illness  Pt admitted following experiencing a fall and hitting his head.  PMH includes sleep apnea, schizophrenia, paranoia, Kaposi carcinoma, Htn, HIV and hypercholestolemia.  Clinical Impression  Pt is a 57 year old male who lives alone in an apartment.  Pt is able to perform bed mobility with use of bed rail and transfers with supervision due to pt not being familiar with using a RW.  PT provided VC's for proper use of RW and advised pt to use a RW for ambulation while he is experiencing difficulty with walking distances due to LBP.  Pt was able to ambulate 100 ft with RW and reported that back pain had increased to a 7/10.  Pt presented with decreased strength of UE and LE and reported no sensation loss.  Pt will continue to benefit from skilled PT with focus on strength, tolerance to activity, functional mobility and safe ambulation with an AD.    Follow Up Recommendations Home health PT    Equipment Recommendations  Rolling walker with 5" wheels    Recommendations for Other Services       Precautions / Restrictions Precautions Precautions: Fall Restrictions Weight Bearing Restrictions: No      Mobility  Bed Mobility Overal bed mobility: Modified Independent             General bed mobility comments: Pt able to complete bed mobility with use of bed rail and increased time due to pt reported back pain.  Transfers Overall transfer level: Needs assistance Equipment used: Rolling walker (2 wheeled) Transfers: Sit to/from Stand Sit to Stand: Supervision         General transfer comment: Pt able to perform STS with RW and VC's from PT for hand placement and safe use of RW.  Ambulation/Gait Ambulation/Gait assistance: Supervision Ambulation Distance (Feet): 100 Feet Assistive device: Rolling walker (2 wheeled)     Gait  velocity interpretation: at or above normal speed for age/gender General Gait Details: Pt able to ambulate 100 ft with RW using good foot clearance, hip and knee flexion and DF.  PT provided VC's for upright posture as pt tends to look at floor during all activity.    Stairs            Wheelchair Mobility    Modified Rankin (Stroke Patients Only)       Balance Overall balance assessment: Modified Independent                                           Pertinent Vitals/Pain Pain Assessment: 0-10 Pain Score: 7  Pain Location: low back during ambulation Pain Descriptors / Indicators: Aching Pain Intervention(s): Limited activity within patient's tolerance    Home Living Family/patient expects to be discharged to:: Private residence Living Arrangements: Alone   Type of Home: Apartment Home Access: Stairs to enter Entrance Stairs-Rails: Can reach both Entrance Stairs-Number of Steps: 11 Home Layout: One level Home Equipment: Grab bars - tub/shower      Prior Function Level of Independence: Independent with assistive device(s)         Comments: Pt states that he hasn't received help with bathing currently but that he loses strength easily and has a fear of falling in the shower.     Hand Dominance  Dominant Hand: Right    Extremity/Trunk Assessment   Upper Extremity Assessment Upper Extremity Assessment: Generalized weakness    Lower Extremity Assessment Lower Extremity Assessment: Generalized weakness    Cervical / Trunk Assessment Cervical / Trunk Assessment: Kyphotic  Communication   Communication: No difficulties  Cognition Arousal/Alertness: Awake/alert Behavior During Therapy: WFL for tasks assessed/performed Overall Cognitive Status: Within Functional Limits for tasks assessed                                 General Comments: A&Ox2      General Comments      Exercises     Assessment/Plan    PT  Assessment Patient needs continued PT services  PT Problem List Decreased strength;Decreased mobility;Decreased activity tolerance;Decreased balance;Decreased knowledge of use of DME;Pain       PT Treatment Interventions DME instruction;Therapeutic activities;Gait training;Therapeutic exercise;Functional mobility training;Balance training;Patient/family education    PT Goals (Current goals can be found in the Care Plan section)  Acute Rehab PT Goals Patient Stated Goal: To return home and maintain as much independence as possible. PT Goal Formulation: With patient Time For Goal Achievement: 08/14/17 Potential to Achieve Goals: Good    Frequency Min 2X/week   Barriers to discharge        Co-evaluation               AM-PAC PT "6 Clicks" Daily Activity  Outcome Measure Difficulty turning over in bed (including adjusting bedclothes, sheets and blankets)?: A Little Difficulty moving from lying on back to sitting on the side of the bed? : A Little Difficulty sitting down on and standing up from a chair with arms (e.g., wheelchair, bedside commode, etc,.)?: A Little Help needed moving to and from a bed to chair (including a wheelchair)?: A Little Help needed walking in hospital room?: A Little Help needed climbing 3-5 steps with a railing? : A Little 6 Click Score: 18    End of Session Equipment Utilized During Treatment: Gait belt Activity Tolerance: Patient limited by pain Patient left: in chair;with chair alarm set;with call bell/phone within reach   PT Visit Diagnosis: Unsteadiness on feet (R26.81);Pain Pain - part of body: (Low back)    Time: 1430-1500 PT Time Calculation (min) (ACUTE ONLY): 30 min   Charges:   PT Evaluation $PT Eval Low Complexity: 1 Low PT Treatments $Therapeutic Activity: 8-22 mins   PT G Codes:   PT G-Codes **NOT FOR INPATIENT CLASS** Functional Assessment Tool Used: AM-PAC 6 Clicks Basic Mobility    Roxanne Gates, PT, DPT   Roxanne Gates 07/31/2017, 3:33 PM

## 2017-07-31 NOTE — H&P (Signed)
[facility] NAME: Danny Cook    MR#:  [id number]  DATE OF BIRTH:  19-Jan-1961  DATE OF ADMISSION:  07/30/2017  PRIMARY CARE PHYSICIAN: Leonel Ramsay, MD   REQUESTING/REFERRING PHYSICIAN:   CHIEF COMPLAINT:   Chief Complaint  Patient presents with  . Dizziness    HISTORY OF PRESENT ILLNESS: Danny Cook  is a 57 y.o. male with a known history of diastolic heart failure, HIV, hypertension, Kaposi's sarcoma, schizophrenia, diabetes type 2, sleep apnea and many other comorbidities. Patient presented to emergency room for acute onset lightheadedness, started just before presenting to emergency room.  Patient complains he feels very dizzy, almost like fainting when he stands up from sitting position.  The lightheadedness resolves when the patient is lying down on the bed.  He denies any chest pain, SOB, or palpitations; no fever or chills; no nausea, vomiting, or diarrhea.  Patient recalls having vertigo episodes in the past but not lightheadedness like this.  No new medications, no changes in the dose of his meds. While in the emergency room he was noted with orthostatic hypotension, that improved very little with IV fluids.  EKG was reviewed and noted without any acute changes.  Troponin level was negative.  UA and chest x-ray do not show any infection.  Creatinine level is slightly elevated at 1.63. And is admitted for further evaluation and treatment.  PAST MEDICAL HISTORY:   Past Medical History:  Diagnosis Date  . Anemia   . Anxiety   . Arthritis   . CHF (congestive heart failure) ([place])   . Chronic kidney disease    Renal Insufficiency Syndrome; Glomerulosclerosis 2013  . Complication of anesthesia   . Depressed   . Diabetes mellitus without complication ([place])   . High cholesterol   . HIV (human immunodeficiency virus infection) ([place])   . Hypertension   . 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[place])   . Paranoid disorder ([place])    . Schizophrenia, paranoid ([place])   . Sleep apnea     PAST SURGICAL HISTORY:  Past Surgical History:  Procedure Laterality Date  . COLONOSCOPY WITH PROPOFOL N/A 09/03/2015   Procedure: COLONOSCOPY WITH PROPOFOL;  Surgeon: Lollie Sails, MD;  Location: [facility] ENDOSCOPY;  Service: Endoscopy;  Laterality: N/A;  . DG TEETH FULL    . ESOPHAGOGASTRODUODENOSCOPY (EGD) WITH PROPOFOL N/A 07/01/2016   Procedure: ESOPHAGOGASTRODUODENOSCOPY (EGD) WITH PROPOFOL;  Surgeon: Lollie Sails, MD;  Location: [facility] ENDOSCOPY;  Service: Endoscopy;  Laterality: N/A;    SOCIAL HISTORY:  Social History   Tobacco Use  . Smoking status: Never Smoker  . Smokeless tobacco: Never Used  Substance Use Topics  . Alcohol use: No    FAMILY HISTORY:  Family History  Problem Relation Age of Onset  . Heart attack Mother   . Diabetes Mellitus II Mother   . Mental illness Mother   . CAD Mother   . Heart attack Father   . CAD Father   . Hypertension Father     DRUG ALLERGIES:  Allergies  Allergen Reactions  . Trazodone And Nefazodone   . Viread [Tenofovir Disoproxil]     Damages kidneys  . Etodolac Rash    REVIEW OF SYSTEMS:   CONSTITUTIONAL: No fever, fatigue or weakness.  EYES: No blurred or double vision.  EARS, NOSE, AND THROAT: No tinnitus or ear pain.  RESPIRATORY: No cough, shortness of breath, wheezing or hemoptysis.  CARDIOVASCULAR: No chest pain, orthopnea, edema.  GASTROINTESTINAL:  No nausea, vomiting, diarrhea or abdominal pain.  GENITOURINARY: No dysuria, hematuria.  ENDOCRINE: No polyuria, nocturia,  HEMATOLOGY: No bleeding SKIN: No rash or lesion. MUSCULOSKELETAL: No joint pain.   NEUROLOGIC: No focal weakness.  PSYCHIATRY: No anxiety or depression.   MEDICATIONS AT HOME:  Prior to Admission medications   Medication Sig Start Date End Date Taking? Authorizing Provider  Abacavir-Dolutegravir-Lamivud (TRIUMEQ) 600-50-300 MG TABS Take 1 tablet by mouth daily.   Yes [provider]  amitriptyline (ELAVIL) 25 MG tablet Take 1 tablet by mouth at bedtime. 11/12/16  Yes [provider]  aspirin 325 MG tablet Take 325 mg by mouth daily.   Yes [provider]  clonazePAM (KLONOPIN) 2 MG tablet Take 2 mg by mouth 2 (two) times daily.   Yes [provider]  cyanocobalamin 1000 MCG tablet Take 1,000 mcg by mouth daily.   Yes [provider]  divalproex (DEPAKOTE ER) 250 MG 24 hr tablet Take 500 mg by mouth 2 (two) times daily.    Yes [provider]  FANAPT 10 MG TABS Take 12 mg by mouth 2 (two) times daily.  11/14/16  Yes [provider]  gabapentin (NEURONTIN) 300 MG capsule Take 300 mg by mouth 3 (three) times daily. 08/21/16  Yes [provider]  gemfibrozil (LOPID) 600 MG tablet Take 1 tablet by mouth 2 (two) times daily. 11/12/16  Yes [provider]  glimepiride (AMARYL) 4 MG tablet Take 4 mg by mouth daily. 08/21/16  Yes [provider]  Insulin Degludec 200 UNIT/ML SOPN Inject 40 Units into the skin daily. 12/16/16  Yes [provider]  liraglutide 18 MG/3ML SOPN Inject 18 mg into the skin daily.  11/06/16 11/06/17 Yes [provider]  lisinopril (PRINIVIL,ZESTRIL) 20 MG tablet Take 20 mg by mouth daily.   Yes [provider]  metFORMIN (GLUCOPHAGE) 1000 MG tablet Take 1,000 mg by mouth 2 (two) times daily with a meal.   Yes [provider]  mirtazapine (REMERON) 30 MG tablet Take 30 mg by mouth at bedtime.   Yes [provider]  omeprazole (PRILOSEC) 40 MG capsule Take 40 mg by mouth 2 (two) times daily.   Yes [provider]  rosuvastatin (CRESTOR) 20 MG tablet Take 20 mg by mouth daily.   Yes [provider]  sucralfate (CARAFATE) 1 g tablet Take 1 g by mouth 2 (two) times daily.   Yes [provider]  meclizine (ANTIVERT) 25 MG tablet Take 1 tablet (25 mg total) by mouth 3 (three) times daily as needed for  dizziness or nausea. 04/12/16   Carrie Mew, MD  ondansetron (ZOFRAN ODT) 4 MG disintegrating tablet Take 1 tablet (4 mg total) by mouth every 8 (eight) hours as needed for nausea or vomiting. Patient not taking: Reported on 07/30/2017 04/12/16   Carrie Mew, MD  promethazine (PHENERGAN) 6.25 MG/5ML syrup Take 6.25 mg by mouth every 6 (six) hours as needed for nausea or vomiting.    [provider]  triamcinolone cream (KENALOG) 0.1 % Apply 1 application topically 2 (two) times daily as needed. 11/16/16   Little, Traci M, PA-C      PHYSICAL EXAMINATION:   VITAL SIGNS: Blood pressure (!) 91/50, pulse 62, temperature 98.7 F (37.1 C), temperature source Oral, resp. rate 16, SpO2 97 %.  GENERAL:  57 y.o.-year-old patient lying in the bed with no acute distress.  EYES: No scleral icterus.  HEENT: Head atraumatic, normocephalic. Oropharynx and nasopharynx clear.  NECK:  Supple, no jugular venous distention. No thyroid enlargement, no tenderness.  LUNGS: Reduced breath sounds bilaterally, no wheezing, rales,rhonchi or crepitation. No use of accessory muscles of respiration.  CARDIOVASCULAR: S1, S2 normal. No murmurs, rubs, or gallops.  ABDOMEN: Soft, nontender, nondistended. Bowel sounds present. No organomegaly or mass.  EXTREMITIES: No pedal edema, cyanosis, or clubbing.  NEUROLOGIC: No focal weakness. Gait not checked, due to patient's dizziness.  PSYCHIATRIC: The patient is alert and oriented x 3.  SKIN: No obvious rash, lesion, or ulcer.   LABORATORY PANEL:   CBC Recent Labs  Lab 07/25/17 1206 07/30/17 1810  WBC 7.2 7.8  HGB 13.2 13.1  HCT 38.4* 38.0*  PLT 274 267  MCV 95.5 96.1  MCH 32.8 33.1  MCHC 34.3 34.5  RDW 13.3 13.4  LYMPHSABS  --  2.7  MONOABS  --  0.6  EOSABS  --  0.1  BASOSABS  --  0.1   ------------------------------------------------------------------------------------------------------------------  Chemistries  Recent Labs  Lab  07/25/17 1206 07/30/17 1810  NA 140 138  K 4.4 4.4  CL 106 105  CO2 23 19*  GLUCOSE 216* 169*  BUN 26* 31*  CREATININE 1.22 1.63*  CALCIUM 9.5 10.0   ------------------------------------------------------------------------------------------------------------------ estimated creatinine clearance is 56 mL/min (A) (by C-G formula based on SCr of 1.63 mg/dL (H)). ------------------------------------------------------------------------------------------------------------------ No results for input(s): TSH, T4TOTAL, T3FREE, THYROIDAB in the last 72 hours.  Invalid input(s): FREET3   Coagulation profile No results for input(s): INR, PROTIME in the last 168 hours. ------------------------------------------------------------------------------------------------------------------- No results for input(s): DDIMER in the last 72 hours. -------------------------------------------------------------------------------------------------------------------  Cardiac Enzymes Recent Labs  Lab 07/25/17 1206 07/30/17 1810  TROPONINI <0.03 <0.03   ------------------------------------------------------------------------------------------------------------------ Invalid input(s): POCBNP  ---------------------------------------------------------------------------------------------------------------  Urinalysis    Component Value Date/Time   COLORURINE STRAW (A) 07/30/2017 2000   APPEARANCEUR HAZY (A) 07/30/2017 2000   APPEARANCEUR Clear 10/23/2014 1719   LABSPEC 1.010 07/30/2017 2000   LABSPEC 1.017 10/23/2014 1719   PHURINE 5.0 07/30/2017 2000   GLUCOSEU NEGATIVE 07/30/2017 2000   GLUCOSEU Negative 10/23/2014 1719   HGBUR SMALL (A) 07/30/2017 2000   BILIRUBINUR NEGATIVE 07/30/2017 2000   BILIRUBINUR Negative 10/23/2014 [place] 07/30/2017 2000   PROTEINUR 30 (A) 07/30/2017 2000   NITRITE NEGATIVE 07/30/2017 2000   LEUKOCYTESUR NEGATIVE 07/30/2017 2000   LEUKOCYTESUR  Negative 10/23/2014 1719     RADIOLOGY: No results found.  EKG: Orders placed or performed during the hospital encounter of 07/30/17  . ED EKG  . ED EKG    IMPRESSION AND PLAN:  1.  Presyncope, unclear etiology .  It could be related to acute renal failure, medications side effects, or HIV.  Will discontinue lisinopril. We will continue IV fluids and will check echocardiogram to rule out other causes.  We will continue to monitor patient on telemetry and follow troponin level.  2.  Acute renal failure, creatinine is elevated at 1.63.  We will start IV fluids and monitor kidney function closely.  Avoid nephrotoxic medications. 3.  Diastolic heart failure, clinically compensated at this time.  We will continue medical management.  4.  HIV, restart home medications. Will have infectious disease specialist further evaluate the patient.  All the records are reviewed and case discussed with ED provider. Management plans discussed with the patient, family and they are in agreement.  CODE STATUS:    Code Status Orders  (From admission, onward)        Start     Ordered  07/31/17 0240  Full code  Continuous     07/31/17 0239    Code Status History    Date Active Date Inactive Code Status Order ID Comments User Context   This patient has a current code status but no historical code status.       TOTAL TIME TAKING CARE OF THIS PATIENT: 42 minutes.    [name] M.D on 07/31/2017 at 2:43 AM  Between 7am to 6pm - Pager - [phone]  After 6pm go to [email or web address] - password [email or web address] [facility]  [facility]  Office  [phone]  CC: Primary care physician; Leonel Ramsay, MD

## 2017-07-31 NOTE — Consult Note (Signed)
[facility] Infectious Disease     Reason for Consult: HIV    Referring Physician: Earleen Newport. R Date of Admission:  07/30/2017   Active Problems:   Orthostatic dizziness   HPI: Danny Cook. is a 57 y.o. male with well controlled  HIV, DM, CKD, HTN schizophrenia admitted with acute onset LH, presyncope.  In ED had orthostatic hypotension and was admitted. Had been seen 1/26 for SOB and CP and had CT chest neg and dced home. He established with CHF clinic on 1/29 and was started on oral lasix 10 mg.  CD4 1000 and VL < 20 on 07/15/17.   CT head neg, CT chest done 1/26 neg for PE.     Past Medical History:  Diagnosis Date  . Anemia   . Anxiety   . Arthritis   . CHF (congestive heart failure) ([place])   . Chronic kidney disease    Renal Insufficiency Syndrome; Glomerulosclerosis 2013  . Complication of anesthesia   . Depressed   . Diabetes mellitus without complication ([place])   . High cholesterol   . HIV (human immunodeficiency virus infection) ([place])   . Hypertension   . 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 Paranoid disorder ([place])   . Schizophrenia, paranoid ([place])   . Sleep apnea    Past Surgical History:  Procedure Laterality Date  . COLONOSCOPY WITH PROPOFOL N/A 09/03/2015   Procedure: COLONOSCOPY WITH PROPOFOL;  Surgeon: Lollie Sails, MD;  Location[facility] ENDOSCOPY;  Service: Endoscopy;  Laterality: N/A;  . DG TEETH FULL    . ESOPHAGOGASTRODUODENOSCOPY (EGD) WITH PROPOFOL N/A 07/01/2016   Procedure: ESOPHAGOGASTRODUODENOSCOPY (EGD) WITH PROPOFOL;  Surgeon: Lollie Sails, MD;  Location: [facility] ENDOSCOPY;  Service: Endoscopy;  Laterality: N/A;   Social History   Tobacco Use  . Smoking status: Never Smoker  . Smokeless tobacco: Never Used  Substance Use Topics  . Alcohol use: No  . Drug use: No   Family History  Problem Relation Age of Onset  . Heart attack Mother   . Diabetes Mellitus II Mother   . Mental illness Mother   . CAD Mother   . Heart attack Father   . CAD  Father   . Hypertension Father     Allergies:  Allergies  Allergen Reactions  . Trazodone And Nefazodone   . Viread [Tenofovir Disoproxil]     Damages kidneys  . Etodolac Rash    Current antibiotics: Antibiotics Given (last 72 hours)    Date/Time Action Medication Dose   07/31/17 1001 Given   abacavir-dolutegravir-lamiVUDine (TRIUMEQ) 600-50-300 MG per tablet 1 tablet 1 tablet   07/31/17 1206 Given   amoxicillin-clavulanate (AUGMENTIN) 875-125 MG per tablet 1 tablet 1 tablet      MEDICATIONS: . abacavir-dolutegravir-lamiVUDine  1 tablet Oral Daily  . amitriptyline  25 mg Oral QHS  . amoxicillin-clavulanate  1 tablet Oral Q12H  . aspirin  325 mg Oral Daily  . clonazePAM  2 mg Oral BID  . divalproex  500 mg Oral BID  . docusate sodium  100 mg Oral BID  . gabapentin  300 mg Oral TID  . gemfibrozil  600 mg Oral BID  . glimepiride  4 mg Oral Daily  . heparin  5,000 Units Subcutaneous Q8H  . iloperidone  12 mg Oral BID  . insulin aspart  0-15 Units Subcutaneous TID WC  . insulin glargine  20 Units Subcutaneous QHS  . metFORMIN  1,000 mg Oral BID WC  . mirtazapine  30 mg  Oral QHS  . pantoprazole  40 mg Oral Daily  . [START ON 08/01/2017] pneumococcal 23 valent vaccine  0.5 mL Intramuscular Tomorrow-1000  . rosuvastatin  20 mg Oral Daily  . sucralfate  1 g Oral BID  . cyanocobalamin  1,000 mcg Oral Daily    Review of Systems - 11 systems reviewed and negative per HPI  OBJECTIVE: Temp:  [97.7 F (36.5 C)-98.7 F (37.1 C)] 97.8 F (36.6 C) (02/01 0820) Pulse Rate:  [59-87] 73 (02/01 0820) Resp:  [16-23] 18 (02/01 0256) BP: (85-122)/(49-84) 119/72 (02/01 0820) SpO2:  [89 %-98 %] 96 % (02/01 0820) Weight:  [98.3 kg (216 lb 12.8 oz)] 98.3 kg (216 lb 12.8 oz) (02/01 0500) Physical Exam  Constitutional: He is oriented to person, place, and time. He appears well-developed and well-nourished. No distress.  HENT:  Mouth/Throat: Oropharynx is clear and moist. No oropharyngeal  exudate.  Cardiovascular: Normal rate, regular rhythm and normal heart sounds. Exam reveals no gallop and no friction rub.  No murmur heard.  Pulmonary/Chest: Effort normal and breath sounds normal. No respiratory distress. He has no wheezes.  Abdominal: Soft. Bowel sounds are normal. He exhibits no distension. There is no tenderness.  Lymphadenopathy:  He has no cervical adenopathy.  Neurological: He is alert and oriented to person, place, and time.  Skin: Skin is warm and dry. No rash noted. No erythema.  Psychiatric: He has a normal mood and affect. His behavior is normal.     LABS: Results for orders placed or performed during the hospital encounter of 07/30/17 (from the past 48 hour(s))  CBC with Differential     Status: Abnormal   Collection Time: 07/30/17  6:10 PM  Result Value Ref Range   WBC 7.8 3.8 - 10.6 K/uL   RBC 3.95 (L) 4.40 - 5.90 MIL/uL   Hemoglobin 13.1 13.0 - 18.0 g/dL   HCT 38.0 (L) 40.0 - 52.0 %   MCV 96.1 80.0 - 100.0 fL   MCH 33.1 26.0 - 34.0 pg   MCHC 34.5 32.0 - 36.0 g/dL   RDW 13.4 11.5 - 14.5 %   Platelets 267 150 - 440 K/uL   Neutrophils Relative % 54 %   Neutro Abs 4.2 1.4 - 6.5 K/uL   Lymphocytes Relative 35 %   Lymphs Abs 2.7 1.0 - 3.6 K/uL   Monocytes Relative 8 %   Monocytes Absolute 0.6 0.2 - 1.0 K/uL   Eosinophils Relative 2 %   Eosinophils Absolute 0.1 0 - 0.7 K/uL   Basophils Relative 1 %   Basophils Absolute 0.1 0 - 0.1 K/uL    Comment: Performed at [facility], [address] [place], [place] [address]  Basic metabolic panel     Status: Abnormal   Collection Time: 07/30/17  6:10 PM  Result Value Ref Range   Sodium 138 135 - 145 mmol/L   Potassium 4.4 3.5 - 5.1 mmol/L   Chloride 105 101 - 111 mmol/L   CO2 19 (L) 22 - 32 mmol/L   Glucose, Bld 169 (H) 65 - 99 mg/dL   BUN 31 (H) 6 - 20 mg/dL   Creatinine, Ser 1.63 (H) 0.61 - 1.24 mg/dL   Calcium 10.0 8.9 - 10.3 mg/dL   GFR calc non Af Amer 45 (L) >60 mL/min   GFR calc Af  Amer 52 (L) >60 mL/min    Comment: (NOTE) The eGFR has been calculated using the CKD EPI equation. This calculation has not been validated in all  clinical situations. eGFR's persistently <60 mL/min signify possible Chronic Kidney Disease.    Anion gap 14 5 - 15    Comment: Performed at [facility], [address] [place], [place] [address]  Troponin I     Status: None   Collection Time: 07/30/17  6:10 PM  Result Value Ref Range   Troponin I <0.03 <0.03 ng/mL    Comment: Performed at [facility], [address] [place], [place] [address]  Urinalysis, Complete w Microscopic     Status: Abnormal   Collection Time: 07/30/17  8:00 PM  Result Value Ref Range   Color, Urine STRAW (A) YELLOW   APPearance HAZY (A) CLEAR   Specific Gravity, Urine 1.010 1.005 - 1.030   pH 5.0 5.0 - 8.0   Glucose, UA NEGATIVE NEGATIVE mg/dL   Hgb urine dipstick SMALL (A) NEGATIVE   Bilirubin Urine NEGATIVE NEGATIVE   Ketones, ur NEGATIVE NEGATIVE mg/dL   Protein, ur 30 (A) NEGATIVE mg/dL   Nitrite NEGATIVE NEGATIVE   Leukocytes, UA NEGATIVE NEGATIVE   RBC / HPF 0-5 0 - 5 RBC/hpf   WBC, UA 0-5 0 - 5 WBC/hpf   Bacteria, UA NONE SEEN NONE SEEN   Squamous Epithelial / LPF NONE SEEN NONE SEEN    Comment: Performed at [facility], [address] [place], [place] [address]  Basic metabolic panel     Status: Abnormal   Collection Time: 07/31/17  3:24 AM  Result Value Ref Range   Sodium 140 135 - 145 mmol/L   Potassium 3.8 3.5 - 5.1 mmol/L   Chloride 106 101 - 111 mmol/L   CO2 23 22 - 32 mmol/L   Glucose, Bld 77 65 - 99 mg/dL   BUN 26 (H) 6 - 20 mg/dL   Creatinine, Ser 1.33 (H) 0.61 - 1.24 mg/dL   Calcium 9.6 8.9 - 10.3 mg/dL   GFR calc non Af Amer 57 (L) >60 mL/min   GFR calc Af Amer >60 >60 mL/min    Comment: (NOTE) The eGFR has been calculated using the CKD EPI equation. This calculation has not been validated in all clinical situations. eGFR's persistently <60  mL/min signify possible Chronic Kidney Disease.    Anion gap 11 5 - 15    Comment: Performed at [facility], [address] [place], [place] [address]  CBC     Status: Abnormal   Collection Time: 07/31/17  3:24 AM  Result Value Ref Range   WBC 8.1 3.8 - 10.6 K/uL   RBC 3.97 (L) 4.40 - 5.90 MIL/uL   Hemoglobin 12.9 (L) 13.0 - 18.0 g/dL   HCT 37.9 (L) 40.0 - 52.0 %   MCV 95.6 80.0 - 100.0 fL   MCH 32.6 26.0 - 34.0 pg   MCHC 34.1 32.0 - 36.0 g/dL   RDW 13.5 11.5 - 14.5 %   Platelets 259 150 - 440 K/uL    Comment: Performed at [facility], [address] [place], [place] [address]  Glucose, capillary     Status: Abnormal   Collection Time: 07/31/17  8:21 AM  Result Value Ref Range   Glucose-Capillary 140 (H) 65 - 99 mg/dL   Comment 1 Notify RN    Comment 2 Document in Chart   Glucose, capillary     Status: None   Collection Time: 07/31/17 12:21 PM  Result Value Ref Range   Glucose-Capillary 97 65 - 99 mg/dL   Comment 1 Notify RN  Comment 2 Document in Chart    No components found for: ESR, C REACTIVE PROTEIN MICRO: No results found for this or any previous visit (from the past 720 hour(s)).  IMAGING: Dg Chest 2 View  Result Date: 07/25/2017 CLINICAL DATA:  Chest pain EXAM: CHEST  2 VIEW COMPARISON:  05/25/2016 FINDINGS: Cardiac shadow is stable. The lungs are well aerated bilaterally. Mild vascular congestion is noted without interstitial edema. No sizable infiltrate is seen. Degenerative changes of the thoracic spine are noted. IMPRESSION: Mild vascular congestion without interstitial edema. Electronically Signed   By: Inez Catalina M.D.   On: 07/25/2017 14:00   Ct Head Wo Contrast  Result Date: 07/31/2017 CLINICAL DATA:  Dizziness and orthostatic hypotension EXAM: CT HEAD WITHOUT CONTRAST TECHNIQUE: Contiguous axial images were obtained from the base of the skull through the vertex without intravenous contrast. COMPARISON:  05/25/2016 FINDINGS: Brain:  No evidence of acute infarction, hemorrhage, hydrocephalus, extra-axial collection or mass lesion/mass effect. Mild atrophic changes are noted. Stable midline lipoma is noted Vascular: No hyperdense vessel or unexpected calcification. Skull: Normal. Negative for fracture or focal lesion. Sinuses/Orbits: No acute finding. Other: None IMPRESSION: Mild atrophic changes.  No acute abnormality noted. Electronically Signed   By: Inez Catalina M.D.   On: 07/31/2017 12:07   Ct Angio Chest Pe W And/or Wo Contrast  Result Date: 07/25/2017 CLINICAL DATA:  Shortness of breath, weakness, fatigue, elevated D-dimer, suspected pulmonary embolism of intermediate clinical probability EXAM: CT ANGIOGRAPHY CHEST WITH CONTRAST TECHNIQUE: Multidetector CT imaging of the chest was performed using the standard protocol during bolus administration of intravenous contrast. Multiplanar CT image reconstructions and MIPs were obtained to evaluate the vascular anatomy. CONTRAST:  68m ISOVUE-370 IOPAMIDOL (ISOVUE-370) INJECTION 76% IV COMPARISON:  07/12/2004 FINDINGS: Cardiovascular: Aneurysmal dilatation of the ascending thoracic aorta 4.3 cm diameter image 38. No evidence of aortic dissection. No pericardial effusion. Heart unremarkable. Pulmonary arteries adequately opacified is and patent. No evidence of pulmonary embolism. Mediastinum/Nodes: Esophagus unremarkable. Few normal size mediastinal lymph nodes. No thoracic adenopathy. Base of cervical region unremarkable. Lungs/Pleura: Minimal subsegmental atelectasis in RIGHT lower lobe. Lungs otherwise clear. No infiltrate, pleural effusion or pneumothorax. Upper Abdomen: Large LEFT adrenal mass 3.9 x 3.4 cm image 82 previously 2.9 x 2.6 cm in 2006; prior MRI of 12/18/2009 demonstrated characteristics consistent with a LEFT adrenal adenoma. Remaining visualized upper abdomen unremarkable. Musculoskeletal: Degenerative disc disease changes thoracic spine. Review of the MIP images confirms the  above findings. IMPRESSION: No evidence of pulmonary embolism. Minimal subsegmental atelectasis RIGHT lower lobe. Mild interval increase in size of a LEFT adrenal adenoma. Electronically Signed   By: MLavonia DanaM.D.   On: 07/25/2017 16:21    Assessment:   KGeza Beranek is a 57y.o. male with HIV, DM, CKD, HTN schizophrenia admitted with acute onset LH, presyncope.  In ED had orthostatic hypotension and was admitted. Had been seen 1/26 for SOB and CP and had CT chest neg and dced home. He established with CHF clinic on 1/29 and was started on oral lasix 10 mg.  His HIV is very well controlled on Triumeq with CD4 1000 and VL < 20 on 07/15/17.   CT head neg, CT chest done 1/26 neg for PE.  I suspect he has some dehydration or med effect or BPPV.  Recommendations Cont triumeq which he has been on for 2 yeasrs and would not cause these sxs Defer further management to medicine. Thank you very much for allowing me to  participate in the care of this patient. Please call with questions.   Cheral Marker. Ola Spurr, MD

## 2017-08-01 DIAGNOSIS — E860 Dehydration: Secondary | ICD-10-CM | POA: Diagnosis present

## 2017-08-01 DIAGNOSIS — Z23 Encounter for immunization: Secondary | ICD-10-CM | POA: Diagnosis not present

## 2017-08-01 LAB — GLUCOSE, CAPILLARY
GLUCOSE-CAPILLARY: 87 mg/dL (ref 65–99)
Glucose-Capillary: 247 mg/dL — ABNORMAL HIGH (ref 65–99)
Glucose-Capillary: 130 mg/dL — ABNORMAL HIGH (ref 65–99)

## 2017-08-01 LAB — ECHOCARDIOGRAM COMPLETE: Weight: 3468.81 oz

## 2017-08-01 MED ORDER — LIRAGLUTIDE 18 MG/3ML ~~LOC~~ SOPN: 1.8000 mg | PEN_INJECTOR | Freq: Every day | SUBCUTANEOUS | 11 refills | Status: DC

## 2017-08-01 MED ORDER — INSULIN DEGLUDEC 200 UNIT/ML ~~LOC~~ SOPN: 20.0000 [IU] | PEN_INJECTOR | Freq: Every day | SUBCUTANEOUS | Status: DC

## 2017-08-01 MED ORDER — AMOXICILLIN-POT CLAVULANATE 875-125 MG PO TABS: 1.0000 | ORAL_TABLET | Freq: Two times a day (BID) | ORAL | 0 refills | Status: DC

## 2017-08-01 NOTE — Discharge Summary (Addendum)
[facility] [place] NAME: Danny Cook    MR#:  [id number]  DATE OF BIRTH:  03-Dec-1960  DATE OF ADMISSION:  07/30/2017 ADMITTING PHYSICIAN: [name], MD  DATE OF DISCHARGE: 08/01/2017  PRIMARY CARE PHYSICIAN: Leonel Ramsay, MD    ADMISSION DIAGNOSIS:  Dehydration [E86.0] Lightheadedness [R42]  DISCHARGE DIAGNOSIS:  Active Problems:   Orthostatic dizziness   SECONDARY DIAGNOSIS:   Past Medical History:  Diagnosis Date  . Anemia   . Anxiety   . Arthritis   . CHF (congestive heart failure) ([place])   . Chronic kidney disease    Renal Insufficiency Syndrome; Glomerulosclerosis 2013  . Complication of anesthesia   . Depressed   . Diabetes mellitus without complication ([place])   . High cholesterol   . HIV (human immunodeficiency virus infection) ([place])   . Hypertension   . 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 Paranoid disorder ([place])   . Schizophrenia, paranoid ([place])   . Sleep apnea     HOSPITAL COURSE:   1.  Acute kidney injury, orthostatic hypotension and dehydration.  This has improved with IV fluids. 2.  Dizziness and right otitis media.  Augmentin prescribed and patient will finish up a course.  Physical therapy recommended home health PT. 3.  HIV on triumeq 4.  Anxiety depression continue psychiatric medications 5.  Type 2 diabetes mellitus on metformin and insulin.  I decreased it down to 20 units.  Hold off on Amaryl and Actos at this point 6.  Hyperlipidemia unspecified on Crestor 7.  Diabetic neuropathy on gabapentin 8.  History of diastolic congestive heart failure.  No signs at this time.  Patient was given IV fluids during the hospital course.  DISCHARGE CONDITIONS:   Satisfactory  CONSULTS OBTAINED:  Treatment Team:  Leonel Ramsay, MD  DRUG ALLERGIES:   Allergies  Allergen Reactions  . Trazodone And Nefazodone   . Viread [Tenofovir Disoproxil]     Damages kidneys  . Etodolac Rash    DISCHARGE  MEDICATIONS:   Allergies as of 08/01/2017      Reactions   Trazodone And Nefazodone    Viread [tenofovir Disoproxil]    Damages kidneys   Etodolac Rash      Medication List    STOP taking these medications   glimepiride 4 MG tablet Commonly known as:  AMARYL   liraglutide 18 MG/3ML Sopn Commonly known as:  VICTOZA   lisinopril 20 MG tablet Commonly known as:  PRINIVIL,ZESTRIL     TAKE these medications   amitriptyline 25 MG tablet Commonly known as:  ELAVIL Take 1 tablet by mouth at bedtime.   amoxicillin-clavulanate 875-125 MG tablet Commonly known as:  AUGMENTIN Take 1 tablet by mouth every 12 (twelve) hours.   aspirin 325 MG tablet Take 325 mg by mouth daily.   clonazePAM 2 MG tablet Commonly known as:  KLONOPIN Take 2 mg by mouth 2 (two) times daily.   cyanocobalamin 1000 MCG tablet Take 1,000 mcg by mouth daily.   divalproex 250 MG 24 hr tablet Commonly known as:  DEPAKOTE ER Take 500 mg by mouth 2 (two) times daily.   FANAPT 10 MG Tabs Generic drug:  Iloperidone Take 12 mg by mouth 2 (two) times daily.   gabapentin 300 MG capsule Commonly known as:  NEURONTIN Take 300 mg by mouth 3 (three) times daily.   gemfibrozil 600 MG tablet Commonly known as:  LOPID Take 1 tablet by mouth 2 (two) times daily.  Insulin Degludec 200 UNIT/ML Sopn Inject 20 Units into the skin daily. What changed:  how much to take   meclizine 25 MG tablet Commonly known as:  ANTIVERT Take 1 tablet (25 mg total) by mouth 3 (three) times daily as needed for dizziness or nausea.   metFORMIN 1000 MG tablet Commonly known as:  GLUCOPHAGE Take 1,000 mg by mouth 2 (two) times daily with a meal.   mirtazapine 30 MG tablet Commonly known as:  REMERON Take 30 mg by mouth at bedtime.   omeprazole 40 MG capsule Commonly known as:  PRILOSEC Take 40 mg by mouth 2 (two) times daily.   ondansetron 4 MG disintegrating tablet Commonly known as:  ZOFRAN ODT Take 1 tablet (4 mg  total) by mouth every 8 (eight) hours as needed for nausea or vomiting.   promethazine 6.25 MG/5ML syrup Commonly known as:  PHENERGAN Take 6.25 mg by mouth every 6 (six) hours as needed for nausea or vomiting.   rosuvastatin 20 MG tablet Commonly known as:  CRESTOR Take 20 mg by mouth daily.   sucralfate 1 g tablet Commonly known as:  CARAFATE Take 1 g by mouth 2 (two) times daily.   triamcinolone cream 0.1 % Commonly known as:  KENALOG Apply 1 application topically 2 (two) times daily as needed.   TRIUMEQ 600-50-300 MG tablet Generic drug:  abacavir-dolutegravir-lamiVUDine Take 1 tablet by mouth daily.            Durable Medical Equipment  (From admission, onward)        Start     Ordered   08/01/17 0935  For home use only DME Walker rolling  Once    Question:  Patient needs a walker to treat with the following condition  Answer:  Unsteady gait   08/01/17 0934       DISCHARGE INSTRUCTIONS:    Follow-up PMD 1 week  If you experience worsening of your admission symptoms, develop shortness of breath, life threatening emergency, suicidal or homicidal thoughts you must seek medical attention immediately by calling 911 or calling your MD immediately  if symptoms less severe.  You Must read complete instructions/literature along with all the possible adverse reactions/side effects for all the Medicines you take and that have been prescribed to you. Take any new Medicines after you have completely understood and accept all the possible adverse reactions/side effects.   Please note  You were cared for by a hospitalist during your hospital stay. If you have any questions about your discharge medications or the care you received while you were in the hospital after you are discharged, you can call the unit and asked to speak with the hospitalist on call if the hospitalist that took care of you is not available. Once you are discharged, your primary care physician will handle  any further medical issues. Please note that NO REFILLS for any discharge medications will be authorized once you are discharged, as it is imperative that you return to your primary care physician (or establish a relationship with a primary care physician if you do not have one) for your aftercare needs so that they can reassess your need for medications and monitor your lab values.    Today   CHIEF COMPLAINT:   Chief Complaint  Patient presents with  . Dizziness    HISTORY OF PRESENT ILLNESS:  Danny Cook  is a 57 y.o. male came in with dizziness.  Found to be in acute kidney injury, orthostatic and dehydrated.  VITAL SIGNS:  Blood pressure 126/72, pulse 71, temperature (!) 97.4 F (36.3 C), temperature source Oral, resp. rate 18, weight 98.6 kg (217 lb 4.8 oz), SpO2 97 %.    PHYSICAL EXAMINATION:  GENERAL:  57 y.o.-year-old patient lying in the bed with no acute distress.  EYES: Pupils equal, round, reactive to light and accommodation. No scleral icterus. Extraocular muscles intact.  HEENT: Head atraumatic, normocephalic. Oropharynx and nasopharynx clear.  NECK:  Supple, no jugular venous distention. No thyroid enlargement, no tenderness.  LUNGS: Normal breath sounds bilaterally, no wheezing, rales,rhonchi or crepitation. No use of accessory muscles of respiration.  CARDIOVASCULAR: S1, S2 normal. No murmurs, rubs, or gallops.  ABDOMEN: Soft, non-tender, non-distended. Bowel sounds present. No organomegaly or mass.  EXTREMITIES: No pedal edema, cyanosis, or clubbing.  NEUROLOGIC: Cranial nerves II through XII are intact. Muscle strength 5/5 in all extremities. Sensation intact. Gait not checked.  PSYCHIATRIC: The patient is alert and oriented x 3.  SKIN: No obvious rash, lesion, or ulcer.   DATA REVIEW:   CBC Recent Labs  Lab 07/31/17 0324  WBC 8.1  HGB 12.9*  HCT 37.9*  PLT 259    Chemistries  Recent Labs  Lab 07/31/17 0324  NA 140  K 3.8  CL 106  CO2 23   GLUCOSE 77  BUN 26*  CREATININE 1.33*  CALCIUM 9.6    Cardiac Enzymes Recent Labs  Lab 07/30/17 1810  TROPONINI <0.03     RADIOLOGY:  Ct Head Wo Contrast  Result Date: 07/31/2017 CLINICAL DATA:  Dizziness and orthostatic hypotension EXAM: CT HEAD WITHOUT CONTRAST TECHNIQUE: Contiguous axial images were obtained from the base of the skull through the vertex without intravenous contrast. COMPARISON:  05/25/2016 FINDINGS: Brain: No evidence of acute infarction, hemorrhage, hydrocephalus, extra-axial collection or mass lesion/mass effect. Mild atrophic changes are noted. Stable midline lipoma is noted Vascular: No hyperdense vessel or unexpected calcification. Skull: Normal. Negative for fracture or focal lesion. Sinuses/Orbits: No acute finding. Other: None IMPRESSION: Mild atrophic changes.  No acute abnormality noted. Electronically Signed   By: Inez Catalina M.D.   On: 07/31/2017 12:07      Management plans discussed with the patient, and he is in agreement.  CODE STATUS:     Code Status Orders  (From admission, onward)        Start     Ordered   07/31/17 0240  Full code  Continuous     07/31/17 0239    Code Status History    Date Active Date Inactive Code Status Order ID Comments User Context   This patient has a current code status but no historical code status.      TOTAL TIME TAKING CARE OF THIS PATIENT: 35 minutes.    Loletha Grayer M.D on 08/01/2017 at 3:10 PM  Between 7am to 6pm - Pager - [phone]  After 6pm go to [email or web address] - password [email or web address]  [employer] Office  [phone]  CC: Primary care physician; Leonel Ramsay, MD

## 2017-08-01 NOTE — Progress Notes (Addendum)
Patient stable for discharge.   PIV discontinued, catheter intact; site clean, dry, intact. Discharge instructions and follow-up reviewed. Patient verbalized understanding.   Family at visitor's entrance to take patient home.   NT to take patient down in wheelchair.

## 2017-08-01 NOTE — Care Management Note (Addendum)
Case Management Note  Patient Details  Name: Danny Cook. MRN: [id number] Date of Birth: July 28, 1960  Subjective/Objective:       Already open to [facility]. A request for a RW was called to [place] [facility].             Action/Plan:   Expected Discharge Date:                  Expected Discharge Plan:     In-House Referral:     Discharge planning Services     Post Acute Care Choice:    Choice offered to:     DME Arranged:    DME Agency:     HH Arranged:    HH Agency:     Status of Service:     If discussed at [employer] of Stay Meetings, dates discussed:    Additional Comments:  Ionia Schey A, RN 08/01/2017, 1:39 PM

## 2017-08-01 NOTE — Progress Notes (Addendum)
Weekend case manager called.  We are waiting for a walker for the patient.  Sts pt cannot leave without walker.  Patient informed.

## 2017-08-02 NOTE — Progress Notes (Signed)
Patient ID: Danny Cook., male   DOB: [birth year], 59 y.o.   MRN: [id number]  Called by nurse that I did not call in Insulin. I called [facility] [phone] and the pharmacist was not very helpful in helping coordinated care I spoke with patient and told him that he already has the insulin Traceba 20 units daily and metformin. I stopped amaryl and victoza.  Dr Leslye Peer

## 2017-08-11 NOTE — Progress Notes (Signed)
Patient ID: Danny Cook., male    DOB: 08/24/1960, 57 y.o.   MRN: [id number]  HPI  [name] is a 57 y/o male with a history of DM, hyperlipidemia, HTN, CKD, HIV, schizophrenia, obstructive sleep apnea, anxiety, osteoarthritis and chronic heart failure.   Echo report from 05/11/17 reviewed and showed an EF of 55% along with mild AR/ TR/ MR.  Admitted 07/30/17 due to hypotension, dizziness and acute kidney injury. Was given IV fluids. Discharged after 2 days with home health PT. Was in the ED 07/25/17 due to shortness of breath. D-dimer slightly elevated. CT angiography was negative for PE. Released home. Was in the ED 04/26/17 due to hyperglycemia where he was treated and released.  He presents today for a follow-up visit with a chief complaint of minimal shortness of breath upon moderate exertion. He describes this as chronic in nature having been present for several months. He has associated fatigue, chest tightness, depression and chronic difficulty sleeping along with this. He denies any chest pain, edema, palpitations, abdominal distention, dizziness or weight gain.   Past Medical History:  Diagnosis Date  . Anemia   . Anxiety   . Arthritis   . CHF (congestive heart failure) ([place])   . Chronic kidney disease    Renal Insufficiency Syndrome; Glomerulosclerosis 2013  . Complication of anesthesia   . Depressed   . Diabetes mellitus without complication ([place])   . High cholesterol   . HIV (human immunodeficiency virus infection) ([place])   . Hypertension   . 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 Paranoid disorder ([place])   . Schizophrenia, paranoid ([place])   . Sleep apnea    Past Surgical History:  Procedure Laterality Date  . COLONOSCOPY WITH PROPOFOL N/A 09/03/2015   Procedure: COLONOSCOPY WITH PROPOFOL;  Surgeon: Lollie Sails, MD;  Location: [facility] ENDOSCOPY;  Service: Endoscopy;  Laterality: N/A;  . DG TEETH FULL    . ESOPHAGOGASTRODUODENOSCOPY (EGD) WITH PROPOFOL N/A 07/01/2016    Procedure: ESOPHAGOGASTRODUODENOSCOPY (EGD) WITH PROPOFOL;  Surgeon: Lollie Sails, MD;  Location: [facility] ENDOSCOPY;  Service: Endoscopy;  Laterality: N/A;   Family History  Problem Relation Age of Onset  . Heart attack Mother   . Diabetes Mellitus II Mother   . Mental illness Mother   . CAD Mother   . Heart attack Father   . CAD Father   . Hypertension Father    Social History   Tobacco Use  . Smoking status: Never Smoker  . Smokeless tobacco: Never Used  Substance Use Topics  . Alcohol use: No   Allergies  Allergen Reactions  . Trazodone And Nefazodone   . Viread [Tenofovir Disoproxil]     Damages kidneys  . Etodolac Rash   Prior to Admission medications   Medication Sig Start Date End Date Taking? Authorizing Provider  Abacavir-Dolutegravir-Lamivud (TRIUMEQ) 600-50-300 MG TABS Take 1 tablet by mouth daily.   Yes [provider]  amitriptyline (ELAVIL) 25 MG tablet Take 1 tablet by mouth at bedtime. 11/12/16  Yes [provider]  amoxicillin-clavulanate (AUGMENTIN) 875-125 MG tablet Take 1 tablet by mouth every 12 (twelve) hours. 08/01/17  Yes Wieting, Richard, MD  aspirin 325 MG tablet Take 325 mg by mouth daily.   Yes [provider]  clonazePAM (KLONOPIN) 2 MG tablet Take 2 mg by mouth 2 (two) times daily.   Yes [provider]  cyanocobalamin 1000 MCG tablet Take 1,000 mcg by mouth daily.   Yes [provider]  divalproex (  DEPAKOTE ER) 250 MG 24 hr tablet Take 500 mg by mouth 2 (two) times daily.    Yes [provider]  FANAPT 10 MG TABS Take 12 mg by mouth 2 (two) times daily.  11/14/16  Yes [provider]  furosemide (LASIX) 20 MG tablet Take 10 mg by mouth daily.   Yes [provider]  gabapentin (NEURONTIN) 300 MG capsule Take 300 mg by mouth 3 (three) times daily. 08/21/16  Yes [provider]  gemfibrozil (LOPID) 600 MG tablet Take 1 tablet by mouth 2 (two) times daily. 11/12/16  Yes  [provider]  Insulin Degludec 200 UNIT/ML SOPN Inject 20 Units into the skin daily. 08/01/17  Yes Wieting, Richard, MD  lisinopril (PRINIVIL,ZESTRIL) 20 MG tablet Take 20 mg by mouth daily.   Yes [provider]  meclizine (ANTIVERT) 25 MG tablet Take 1 tablet (25 mg total) by mouth 3 (three) times daily as needed for dizziness or nausea. 04/12/16  Yes Carrie Mew, MD  metFORMIN (GLUCOPHAGE) 1000 MG tablet Take 1,000 mg by mouth 2 (two) times daily with a meal.   Yes [provider]  mirtazapine (REMERON) 30 MG tablet Take 30 mg by mouth at bedtime.   Yes [provider]  omeprazole (PRILOSEC) 40 MG capsule Take 40 mg by mouth 2 (two) times daily.   Yes [provider]  ondansetron (ZOFRAN ODT) 4 MG disintegrating tablet Take 1 tablet (4 mg total) by mouth every 8 (eight) hours as needed for nausea or vomiting. 04/12/16  Yes Carrie Mew, MD  promethazine (PHENERGAN) 6.25 MG/5ML syrup Take 6.25 mg by mouth every 6 (six) hours as needed for nausea or vomiting.   Yes [provider]  rosuvastatin (CRESTOR) 20 MG tablet Take 20 mg by mouth daily.   Yes [provider]  sucralfate (CARAFATE) 1 g tablet Take 1 g by mouth 2 (two) times daily.   Yes [provider]  triamcinolone cream (KENALOG) 0.1 % Apply 1 application topically 2 (two) times daily as needed. 11/16/16  Yes Little, Traci M, PA-C    Review of Systems  Constitutional: Positive for fatigue. Negative for appetite change.  HENT: Negative for congestion, postnasal drip and sore throat.   Eyes: Negative.   Respiratory: Positive for chest tightness (2 days ago) and shortness of breath (with moderate exertion). Negative for cough.   Cardiovascular: Negative for chest pain, palpitations and leg swelling.  Gastrointestinal: Negative for abdominal distention and abdominal pain.  Endocrine: Negative.   Genitourinary: Negative.   Musculoskeletal: Positive for back  pain. Negative for neck pain.  Skin: Negative.   Allergic/Immunologic: Negative.   Neurological: Negative for dizziness and light-headedness.  Hematological: Negative for adenopathy. Does not bruise/bleed easily.  Psychiatric/Behavioral: Positive for dysphoric mood and sleep disturbance (waking up every few hours). The patient is nervous/anxious.    Vitals:   08/12/17 1116  BP: 119/60  Pulse: (!) 102  Resp: 18  SpO2: 97%  Weight: 218 lb 8 oz (99.1 kg)  Height: 5\' 7"  (1.702 m)   Wt Readings from Last 3 Encounters:  08/12/17 218 lb 8 oz (99.1 kg)  08/01/17 217 lb 4.8 oz (98.6 kg)  07/28/17 223 lb 8 oz (101.4 kg)   Lab Results  Component Value Date   CREATININE 1.25 (H) 08/12/2017   CREATININE 1.33 (H) 07/31/2017   CREATININE 1.63 (H) 07/30/2017    Physical Exam  Constitutional: He is oriented to person, place, and time. He appears well-developed and well-nourished.  HENT:  Head: Normocephalic and atraumatic.  Neck: Normal range of motion. Neck supple. No JVD present.  Cardiovascular: Normal rate and regular rhythm.  Pulmonary/Chest: Effort normal. He has no wheezes. He has no rales.  Abdominal: Soft. He exhibits no distension. There is no tenderness.  Musculoskeletal: He exhibits no edema or tenderness.  Neurological: He is alert and oriented to person, place, and time.  Skin: Skin is warm and dry.  Psychiatric: He has a normal mood and affect. His behavior is normal. Thought content normal.  Nursing note and vitals reviewed.  Assessment & Plan:  1: Chronic heart failure with preserved ejection fraction- - NYHA class II - euvolemic today - weighing daily; reminded to call for an overnight weight gain of >2 pounds or a weekly weight gain of >5 pounds - weight down 5 pounds since 07/28/17 - not adding salt to his food.  - saw cardiology (Danny Cook) 04/08/17 - patient reports receiving his flu vaccine for this season - will get BMP since furosemide was added at last  visit  2: HTN- - BP looks good today - BMP from 07/31/17 reviewed and showed sodium 140, potassium 3.8 and GFR 57 - saw PCP Danny Cook) 07/25/17  3: Diabetes- - fasting glucose at home this morning was 284 - A1c 07/14/17 was 8.0% - saw endocrinologist Danny Cook) 07/15/17 & returns 08/24/17 - saw nephrologist (Danny Cook) 06/16/17  4: Schizophrenia- - sees psychiatrist  Medication bottles were reviewed.  Return in 3 months or sooner for any questions/problems before then.

## 2017-08-12 ENCOUNTER — Telehealth: Payer: Self-pay | Admitting: Family

## 2017-08-12 ENCOUNTER — Ambulatory Visit: Payer: Medicare Other | Attending: Family | Admitting: Family

## 2017-08-12 ENCOUNTER — Encounter: Payer: Self-pay | Admitting: Family

## 2017-08-12 VITALS — BP 119/60 | HR 102 | Resp 18 | Ht 67.0 in | Wt 218.5 lb

## 2017-08-12 DIAGNOSIS — Z7982 Long term (current) use of aspirin: Secondary | ICD-10-CM | POA: Diagnosis not present

## 2017-08-12 DIAGNOSIS — E7800 Pure hypercholesterolemia, unspecified: Secondary | ICD-10-CM | POA: Diagnosis not present

## 2017-08-12 DIAGNOSIS — R0789 Other chest pain: Secondary | ICD-10-CM | POA: Insufficient documentation

## 2017-08-12 DIAGNOSIS — Z79899 Other long term (current) drug therapy: Secondary | ICD-10-CM | POA: Diagnosis not present

## 2017-08-12 DIAGNOSIS — G4733 Obstructive sleep apnea (adult) (pediatric): Secondary | ICD-10-CM | POA: Insufficient documentation

## 2017-08-12 DIAGNOSIS — Z8249 Family history of ischemic heart disease and other diseases of the circulatory system: Secondary | ICD-10-CM | POA: Diagnosis not present

## 2017-08-12 DIAGNOSIS — Z833 Family history of diabetes mellitus: Secondary | ICD-10-CM | POA: Diagnosis not present

## 2017-08-12 DIAGNOSIS — E1165 Type 2 diabetes mellitus with hyperglycemia: Secondary | ICD-10-CM | POA: Insufficient documentation

## 2017-08-12 DIAGNOSIS — I5032 Chronic diastolic (congestive) heart failure: Secondary | ICD-10-CM | POA: Diagnosis present

## 2017-08-12 DIAGNOSIS — F419 Anxiety disorder, unspecified: Secondary | ICD-10-CM | POA: Diagnosis not present

## 2017-08-12 DIAGNOSIS — I130 Hypertensive heart and chronic kidney disease with heart failure and stage 1 through stage 4 chronic kidney disease, or unspecified chronic kidney disease: Secondary | ICD-10-CM | POA: Insufficient documentation

## 2017-08-12 DIAGNOSIS — I10 Essential (primary) hypertension: Secondary | ICD-10-CM

## 2017-08-12 DIAGNOSIS — F209 Schizophrenia, unspecified: Secondary | ICD-10-CM | POA: Diagnosis not present

## 2017-08-12 DIAGNOSIS — Z9889 Other specified postprocedural states: Secondary | ICD-10-CM | POA: Insufficient documentation

## 2017-08-12 DIAGNOSIS — N189 Chronic kidney disease, unspecified: Secondary | ICD-10-CM | POA: Insufficient documentation

## 2017-08-12 DIAGNOSIS — Z85831 Personal history of malignant neoplasm of soft tissue: Secondary | ICD-10-CM | POA: Insufficient documentation

## 2017-08-12 DIAGNOSIS — N179 Acute kidney failure, unspecified: Secondary | ICD-10-CM | POA: Insufficient documentation

## 2017-08-12 DIAGNOSIS — F329 Major depressive disorder, single episode, unspecified: Secondary | ICD-10-CM | POA: Diagnosis not present

## 2017-08-12 DIAGNOSIS — Z794 Long term (current) use of insulin: Secondary | ICD-10-CM | POA: Insufficient documentation

## 2017-08-12 DIAGNOSIS — Z818 Family history of other mental and behavioral disorders: Secondary | ICD-10-CM | POA: Insufficient documentation

## 2017-08-12 DIAGNOSIS — Z888 Allergy status to other drugs, medicaments and biological substances status: Secondary | ICD-10-CM | POA: Insufficient documentation

## 2017-08-12 DIAGNOSIS — E1122 Type 2 diabetes mellitus with diabetic chronic kidney disease: Secondary | ICD-10-CM | POA: Insufficient documentation

## 2017-08-12 DIAGNOSIS — E119 Type 2 diabetes mellitus without complications: Secondary | ICD-10-CM

## 2017-08-12 DIAGNOSIS — B20 Human immunodeficiency virus [HIV] disease: Secondary | ICD-10-CM | POA: Diagnosis not present

## 2017-08-12 DIAGNOSIS — M1990 Unspecified osteoarthritis, unspecified site: Secondary | ICD-10-CM | POA: Insufficient documentation

## 2017-08-12 LAB — BASIC METABOLIC PANEL
ANION GAP: 13 (ref 5–15)
BUN: 22 mg/dL — ABNORMAL HIGH (ref 6–20)
CALCIUM: 9.5 mg/dL (ref 8.9–10.3)
CO2: 23 mmol/L (ref 22–32)
Chloride: 106 mmol/L (ref 101–111)
Creatinine, Ser: 1.25 mg/dL — ABNORMAL HIGH (ref 0.61–1.24)
GLUCOSE: 181 mg/dL — AB (ref 65–99)
Potassium: 4.3 mmol/L (ref 3.5–5.1)
SODIUM: 142 mmol/L (ref 135–145)

## 2017-08-12 NOTE — Patient Instructions (Addendum)
Continue weighing daily and call for an overnight weight gain of > 2 pounds or a weekly weight gain of >5 pounds.  Drink between 40-60 ounces of fluid daily.

## 2017-08-12 NOTE — Telephone Encounter (Signed)
Reviewed lab results with patient that were obtained today (08/12/17). Potassium, sodium and GFR are all normal. Continue medications at this time.

## 2017-08-17 ENCOUNTER — Other Ambulatory Visit: Payer: Self-pay

## 2017-08-25 ENCOUNTER — Ambulatory Visit: Payer: Medicare Other | Admitting: Family

## 2017-10-07 ENCOUNTER — Telehealth: Payer: Self-pay | Admitting: Family

## 2017-10-07 NOTE — Telephone Encounter (Signed)
Marita Kansas from [facility] home health called to report that patient had gained 5 pounds in the last week. She says that he's a little more short of breath and has a little more swelling in his ankles than normal. Currently taking 10mg  furosemide daily.   Casimiro Needle to have him take a whole 20mg  furosemide daily for the next 3 days. Encouraged low sodium diet as Marita Kansas says they had a long discussion about this today as he's been eating bacon.   To call back if weight does not decline or if symptoms worsen.

## 2017-11-04 ENCOUNTER — Encounter: Payer: Self-pay | Admitting: Emergency Medicine

## 2017-11-04 ENCOUNTER — Inpatient Hospital Stay
Admission: AD | Admit: 2017-11-04 | Discharge: 2017-11-09 | DRG: 885 | Disposition: A | Discharge status: Home / Self Care | Payer: Medicare Other | Source: Intra-hospital | Attending: Psychiatry | Admitting: Psychiatry

## 2017-11-04 ENCOUNTER — Emergency Department (EMERGENCY_DEPARTMENT_HOSPITAL)
Admission: EM | Admit: 2017-11-04 | Discharge: 2017-11-04 | Disposition: A | Discharge status: Other Acute Inpatient Hospital | Payer: Medicare Other | Source: Home / Self Care | Attending: Emergency Medicine | Admitting: Emergency Medicine

## 2017-11-04 ENCOUNTER — Other Ambulatory Visit: Payer: Self-pay

## 2017-11-04 DIAGNOSIS — M1990 Unspecified osteoarthritis, unspecified site: Secondary | ICD-10-CM | POA: Diagnosis present

## 2017-11-04 DIAGNOSIS — Z21 Asymptomatic human immunodeficiency virus [HIV] infection status: Secondary | ICD-10-CM

## 2017-11-04 DIAGNOSIS — E1122 Type 2 diabetes mellitus with diabetic chronic kidney disease: Secondary | ICD-10-CM | POA: Insufficient documentation

## 2017-11-04 DIAGNOSIS — I130 Hypertensive heart and chronic kidney disease with heart failure and stage 1 through stage 4 chronic kidney disease, or unspecified chronic kidney disease: Secondary | ICD-10-CM | POA: Insufficient documentation

## 2017-11-04 DIAGNOSIS — F259 Schizoaffective disorder, unspecified: Secondary | ICD-10-CM

## 2017-11-04 DIAGNOSIS — N189 Chronic kidney disease, unspecified: Secondary | ICD-10-CM | POA: Insufficient documentation

## 2017-11-04 DIAGNOSIS — F32A Depression, unspecified: Secondary | ICD-10-CM

## 2017-11-04 DIAGNOSIS — F329 Major depressive disorder, single episode, unspecified: Secondary | ICD-10-CM

## 2017-11-04 DIAGNOSIS — E7800 Pure hypercholesterolemia, unspecified: Secondary | ICD-10-CM | POA: Diagnosis present

## 2017-11-04 DIAGNOSIS — Z7982 Long term (current) use of aspirin: Secondary | ICD-10-CM | POA: Diagnosis not present

## 2017-11-04 DIAGNOSIS — G4730 Sleep apnea, unspecified: Secondary | ICD-10-CM | POA: Diagnosis present

## 2017-11-04 DIAGNOSIS — Z888 Allergy status to other drugs, medicaments and biological substances status: Secondary | ICD-10-CM

## 2017-11-04 DIAGNOSIS — F251 Schizoaffective disorder, depressive type: Secondary | ICD-10-CM | POA: Diagnosis present

## 2017-11-04 DIAGNOSIS — I5032 Chronic diastolic (congestive) heart failure: Secondary | ICD-10-CM | POA: Diagnosis present

## 2017-11-04 DIAGNOSIS — B20 Human immunodeficiency virus [HIV] disease: Secondary | ICD-10-CM | POA: Diagnosis present

## 2017-11-04 DIAGNOSIS — Z8249 Family history of ischemic heart disease and other diseases of the circulatory system: Secondary | ICD-10-CM | POA: Diagnosis not present

## 2017-11-04 DIAGNOSIS — Z8589 Personal history of malignant neoplasm of other organs and systems: Secondary | ICD-10-CM

## 2017-11-04 DIAGNOSIS — Z7984 Long term (current) use of oral hypoglycemic drugs: Secondary | ICD-10-CM

## 2017-11-04 DIAGNOSIS — I10 Essential (primary) hypertension: Secondary | ICD-10-CM | POA: Diagnosis present

## 2017-11-04 DIAGNOSIS — R45851 Suicidal ideations: Secondary | ICD-10-CM

## 2017-11-04 DIAGNOSIS — Z87448 Personal history of other diseases of urinary system: Secondary | ICD-10-CM | POA: Diagnosis not present

## 2017-11-04 DIAGNOSIS — Z833 Family history of diabetes mellitus: Secondary | ICD-10-CM

## 2017-11-04 DIAGNOSIS — E119 Type 2 diabetes mellitus without complications: Secondary | ICD-10-CM

## 2017-11-04 DIAGNOSIS — Z818 Family history of other mental and behavioral disorders: Secondary | ICD-10-CM

## 2017-11-04 LAB — COMPREHENSIVE METABOLIC PANEL
ALT: 19 U/L (ref 17–63)
AST: 29 U/L (ref 15–41)
Albumin: 4.3 g/dL (ref 3.5–5.0)
Alkaline Phosphatase: 92 U/L (ref 38–126)
Anion gap: 13 (ref 5–15)
BILIRUBIN TOTAL: 0.5 mg/dL (ref 0.3–1.2)
BUN: 24 mg/dL — AB (ref 6–20)
CHLORIDE: 103 mmol/L (ref 101–111)
CO2: 23 mmol/L (ref 22–32)
CREATININE: 1.43 mg/dL — AB (ref 0.61–1.24)
Calcium: 9.3 mg/dL (ref 8.9–10.3)
GFR calc Af Amer: >60 mL/min (ref >60)
GFR, EST NON AFRICAN AMERICAN: 52 mL/min — AB (ref >60)
Glucose, Bld: 87 mg/dL (ref 65–99)
POTASSIUM: 4 mmol/L (ref 3.5–5.1)
SODIUM: 139 mmol/L (ref 135–145)
Total Protein: 8.2 g/dL — ABNORMAL HIGH (ref 6.5–8.1)

## 2017-11-04 LAB — SALICYLATE LEVEL: Salicylate Lvl: <7 mg/dL (ref 2.8–30.0)

## 2017-11-04 LAB — URINE DRUG SCREEN, QUALITATIVE (ARMC ONLY)
Amphetamines, Ur Screen: NOT DETECTED
BENZODIAZEPINE, UR SCRN: POSITIVE — AB
Barbiturates, Ur Screen: NOT DETECTED
CANNABINOID 50 NG, UR ~~LOC~~: NOT DETECTED
COCAINE METABOLITE, UR ~~LOC~~: NOT DETECTED
MDMA (Ecstasy)Ur Screen: NOT DETECTED
METHADONE SCREEN, URINE: NOT DETECTED
OPIATE, UR SCREEN: NOT DETECTED
Phencyclidine (PCP) Ur S: NOT DETECTED
TRICYCLIC, UR SCREEN: NOT DETECTED

## 2017-11-04 LAB — CBC
HCT: 39.1 % — ABNORMAL LOW (ref 40.0–52.0)
HEMOGLOBIN: 13.5 g/dL (ref 13.0–18.0)
MCH: 32 pg (ref 26.0–34.0)
MCHC: 34.4 g/dL (ref 32.0–36.0)
MCV: 93 fL (ref 80.0–100.0)
Platelets: 243 10*3/uL (ref 150–440)
RBC: 4.2 MIL/uL — ABNORMAL LOW (ref 4.40–5.90)
RDW: 13.8 % (ref 11.5–14.5)
WBC: 7.9 10*3/uL (ref 3.8–10.6)

## 2017-11-04 LAB — GLUCOSE, CAPILLARY: Glucose-Capillary: 107 mg/dL — ABNORMAL HIGH (ref 65–99)

## 2017-11-04 LAB — ACETAMINOPHEN LEVEL

## 2017-11-04 LAB — ETHANOL

## 2017-11-04 MED ORDER — INSULIN ASPART 100 UNIT/ML ~~LOC~~ SOLN: 0.0000 [IU] | Freq: Three times a day (TID) | SUBCUTANEOUS | Status: DC

## 2017-11-04 MED ORDER — LISINOPRIL 20 MG PO TABS
20.0000 mg | ORAL_TABLET | Freq: Every day | ORAL | Status: DC
  Administered 2017-11-05 – 2017-11-09 (×4): 20 mg via ORAL
  Filled 2017-11-04 (×5): qty 1

## 2017-11-04 MED ORDER — CLONAZEPAM 0.5 MG PO TABS
1.0000 mg | ORAL_TABLET | Freq: Three times a day (TID) | ORAL | Status: DC
  Administered 2017-11-04: 1 mg via ORAL

## 2017-11-04 MED ORDER — GABAPENTIN 300 MG PO CAPS
300.0000 mg | ORAL_CAPSULE | Freq: Three times a day (TID) | ORAL | Status: DC
  Administered 2017-11-05 – 2017-11-09 (×13): 300 mg via ORAL
  Filled 2017-11-04 (×13): qty 1

## 2017-11-04 MED ORDER — GABAPENTIN 300 MG PO CAPS
300.0000 mg | ORAL_CAPSULE | Freq: Three times a day (TID) | ORAL | Status: DC
  Administered 2017-11-04: 300 mg via ORAL
  Filled 2017-11-04: qty 1

## 2017-11-04 MED ORDER — GEMFIBROZIL 600 MG PO TABS
600.0000 mg | ORAL_TABLET | Freq: Two times a day (BID) | ORAL | Status: DC
  Filled 2017-11-04: qty 1

## 2017-11-04 MED ORDER — MAGNESIUM HYDROXIDE 400 MG/5ML PO SUSP: 30.0000 mL | Freq: Every day | ORAL | Status: DC | PRN

## 2017-11-04 MED ORDER — ALUM & MAG HYDROXIDE-SIMETH 200-200-20 MG/5ML PO SUSP
30.0000 mL | ORAL | Status: DC | PRN
  Administered 2017-11-08: 30 mL via ORAL
  Filled 2017-11-04: qty 30

## 2017-11-04 MED ORDER — ROSUVASTATIN CALCIUM 20 MG PO TABS
20.0000 mg | ORAL_TABLET | Freq: Every day | ORAL | Status: DC
  Administered 2017-11-04: 20 mg via ORAL
  Filled 2017-11-04: qty 1

## 2017-11-04 MED ORDER — ABACAVIR-DOLUTEGRAVIR-LAMIVUD 600-50-300 MG PO TABS
1.0000 | ORAL_TABLET | Freq: Every day | ORAL | Status: DC
  Administered 2017-11-05 – 2017-11-09 (×5): 1 via ORAL
  Filled 2017-11-04 (×5): qty 1

## 2017-11-04 MED ORDER — ABACAVIR-DOLUTEGRAVIR-LAMIVUD 600-50-300 MG PO TABS: 1.0000 | ORAL_TABLET | Freq: Every day | ORAL | Status: DC

## 2017-11-04 MED ORDER — ASPIRIN EC 81 MG PO TBEC
DELAYED_RELEASE_TABLET | ORAL | Status: AC
  Administered 2017-11-04: 81 mg via ORAL
  Filled 2017-11-04: qty 1

## 2017-11-04 MED ORDER — CLONAZEPAM 1 MG PO TABS
1.0000 mg | ORAL_TABLET | Freq: Three times a day (TID) | ORAL | Status: DC
  Administered 2017-11-05: 1 mg via ORAL
  Filled 2017-11-04: qty 1

## 2017-11-04 MED ORDER — ASPIRIN EC 81 MG PO TBEC
81.0000 mg | DELAYED_RELEASE_TABLET | Freq: Every day | ORAL | Status: DC
  Administered 2017-11-04: 81 mg via ORAL

## 2017-11-04 MED ORDER — ILOPERIDONE 4 MG PO TABS
12.0000 mg | ORAL_TABLET | Freq: Two times a day (BID) | ORAL | Status: DC
  Filled 2017-11-04: qty 3

## 2017-11-04 MED ORDER — PANTOPRAZOLE SODIUM 40 MG PO TBEC
DELAYED_RELEASE_TABLET | ORAL | Status: AC
  Administered 2017-11-04: 40 mg via ORAL
  Filled 2017-11-04: qty 1

## 2017-11-04 MED ORDER — LISINOPRIL 10 MG PO TABS
20.0000 mg | ORAL_TABLET | Freq: Every day | ORAL | Status: DC
  Administered 2017-11-04: 20 mg via ORAL

## 2017-11-04 MED ORDER — ROSUVASTATIN CALCIUM 20 MG PO TABS
20.0000 mg | ORAL_TABLET | Freq: Every day | ORAL | Status: DC
  Administered 2017-11-05 – 2017-11-08 (×4): 20 mg via ORAL
  Filled 2017-11-04 (×4): qty 1

## 2017-11-04 MED ORDER — AMITRIPTYLINE HCL 50 MG PO TABS: 25.0000 mg | ORAL_TABLET | Freq: Every day | ORAL | Status: DC

## 2017-11-04 MED ORDER — METFORMIN HCL 500 MG PO TABS
ORAL_TABLET | ORAL | Status: AC
  Administered 2017-11-04: 1000 mg via ORAL
  Filled 2017-11-04: qty 2

## 2017-11-04 MED ORDER — METFORMIN HCL 500 MG PO TABS
1000.0000 mg | ORAL_TABLET | Freq: Two times a day (BID) | ORAL | Status: DC
  Administered 2017-11-05 – 2017-11-09 (×9): 1000 mg via ORAL
  Filled 2017-11-04 (×9): qty 2

## 2017-11-04 MED ORDER — ILOPERIDONE 4 MG PO TABS
12.0000 mg | ORAL_TABLET | Freq: Two times a day (BID) | ORAL | Status: DC
  Administered 2017-11-04 – 2017-11-09 (×10): 12 mg via ORAL
  Filled 2017-11-04 (×11): qty 3

## 2017-11-04 MED ORDER — TRAZODONE HCL 100 MG PO TABS: 100.0000 mg | ORAL_TABLET | Freq: Every evening | ORAL | Status: DC | PRN

## 2017-11-04 MED ORDER — INSULIN DEGLUDEC 100 UNIT/ML ~~LOC~~ SOPN: 84.0000 [IU] | PEN_INJECTOR | SUBCUTANEOUS | Status: DC

## 2017-11-04 MED ORDER — PANTOPRAZOLE SODIUM 40 MG PO TBEC
40.0000 mg | DELAYED_RELEASE_TABLET | Freq: Every day | ORAL | Status: DC
  Administered 2017-11-04: 40 mg via ORAL

## 2017-11-04 MED ORDER — CLONAZEPAM 0.5 MG PO TABS
ORAL_TABLET | ORAL | Status: AC
  Administered 2017-11-04: 1 mg via ORAL
  Filled 2017-11-04: qty 2

## 2017-11-04 MED ORDER — MIRTAZAPINE 15 MG PO TABS: 30.0000 mg | ORAL_TABLET | Freq: Every day | ORAL | Status: DC

## 2017-11-04 MED ORDER — HYDROXYZINE HCL 50 MG PO TABS
50.0000 mg | ORAL_TABLET | Freq: Three times a day (TID) | ORAL | Status: DC | PRN
  Administered 2017-11-05: 50 mg via ORAL
  Filled 2017-11-04: qty 1

## 2017-11-04 MED ORDER — ACETAMINOPHEN 325 MG PO TABS: 650.0000 mg | ORAL_TABLET | Freq: Four times a day (QID) | ORAL | Status: DC | PRN

## 2017-11-04 MED ORDER — DIVALPROEX SODIUM 500 MG PO DR TAB: 500.0000 mg | DELAYED_RELEASE_TABLET | Freq: Two times a day (BID) | ORAL | Status: DC

## 2017-11-04 MED ORDER — GLIMEPIRIDE 2 MG PO TABS
4.0000 mg | ORAL_TABLET | Freq: Every day | ORAL | Status: DC
  Administered 2017-11-06 – 2017-11-09 (×4): 4 mg via ORAL
  Filled 2017-11-04 (×3): qty 2
  Filled 2017-11-04: qty 1
  Filled 2017-11-04 (×2): qty 2

## 2017-11-04 MED ORDER — GEMFIBROZIL 600 MG PO TABS
600.0000 mg | ORAL_TABLET | Freq: Two times a day (BID) | ORAL | Status: DC
  Administered 2017-11-05 – 2017-11-09 (×9): 600 mg via ORAL
  Filled 2017-11-04 (×9): qty 1

## 2017-11-04 MED ORDER — AMITRIPTYLINE HCL 25 MG PO TABS
25.0000 mg | ORAL_TABLET | Freq: Every day | ORAL | Status: DC
  Administered 2017-11-04: 25 mg via ORAL
  Filled 2017-11-04: qty 1

## 2017-11-04 MED ORDER — INSULIN ASPART 100 UNIT/ML ~~LOC~~ SOLN
0.0000 [IU] | Freq: Three times a day (TID) | SUBCUTANEOUS | Status: DC
  Administered 2017-11-05: 3 [IU] via SUBCUTANEOUS
  Administered 2017-11-06: 2 [IU] via SUBCUTANEOUS
  Administered 2017-11-06: 5 [IU] via SUBCUTANEOUS
  Administered 2017-11-07 – 2017-11-09 (×5): 3 [IU] via SUBCUTANEOUS
  Filled 2017-11-04 (×5): qty 1

## 2017-11-04 MED ORDER — GLIMEPIRIDE 4 MG PO TABS
4.0000 mg | ORAL_TABLET | Freq: Every day | ORAL | Status: DC
  Filled 2017-11-04: qty 1

## 2017-11-04 MED ORDER — VITAMIN B-12 1000 MCG PO TABS: 1000.0000 ug | ORAL_TABLET | Freq: Every day | ORAL | Status: DC

## 2017-11-04 MED ORDER — MIRTAZAPINE 15 MG PO TABS
30.0000 mg | ORAL_TABLET | Freq: Every day | ORAL | Status: DC
  Administered 2017-11-04 – 2017-11-08 (×5): 30 mg via ORAL
  Filled 2017-11-04 (×5): qty 2

## 2017-11-04 MED ORDER — PANTOPRAZOLE SODIUM 40 MG PO TBEC
40.0000 mg | DELAYED_RELEASE_TABLET | Freq: Every day | ORAL | Status: DC
  Administered 2017-11-05 – 2017-11-09 (×5): 40 mg via ORAL
  Filled 2017-11-04 (×5): qty 1

## 2017-11-04 MED ORDER — ASPIRIN EC 81 MG PO TBEC
81.0000 mg | DELAYED_RELEASE_TABLET | Freq: Every day | ORAL | Status: DC
  Administered 2017-11-05 – 2017-11-09 (×5): 81 mg via ORAL
  Filled 2017-11-04 (×5): qty 1

## 2017-11-04 MED ORDER — METFORMIN HCL 500 MG PO TABS
1000.0000 mg | ORAL_TABLET | Freq: Two times a day (BID) | ORAL | Status: DC
  Administered 2017-11-04: 1000 mg via ORAL

## 2017-11-04 MED ORDER — VITAMIN B-12 1000 MCG PO TABS
1000.0000 ug | ORAL_TABLET | Freq: Every day | ORAL | Status: DC
  Administered 2017-11-05 – 2017-11-09 (×5): 1000 ug via ORAL
  Filled 2017-11-04 (×5): qty 1

## 2017-11-04 MED ORDER — DIVALPROEX SODIUM 500 MG PO DR TAB
500.0000 mg | DELAYED_RELEASE_TABLET | Freq: Two times a day (BID) | ORAL | Status: DC
  Administered 2017-11-04 – 2017-11-09 (×10): 500 mg via ORAL
  Filled 2017-11-04 (×10): qty 1

## 2017-11-04 MED ORDER — LISINOPRIL 10 MG PO TABS
ORAL_TABLET | ORAL | Status: AC
  Administered 2017-11-04: 20 mg via ORAL
  Filled 2017-11-04: qty 2

## 2017-11-04 NOTE — ED Notes (Signed)
Report called to [facility] RN in BMU. Patient aware of plan of care and is agreeable with plan. Patient currently denies SI/HI/AVH and pain.

## 2017-11-04 NOTE — BH Assessment (Signed)
Patient is to be admitted to [facility] by Dr. Weber Cooks.  Attending Physician will be Dr. Wonda Olds.   Patient has been assigned to room 320, by [place].   Intake Paper Work has been signed and placed on patient chart.  ER staff is aware of the admission:  Luann:ER Sectary   Dr. Quentin Cornwall: ER MD   Anderson Malta: Patient's Nurse   Genella Rife: Patient Access.

## 2017-11-04 NOTE — Progress Notes (Signed)
Patient ID: Danny Cook., male   DOB: 1961-02-18, 57 y.o.   MRN: [id number] Per State regulations 482.30 this chart was reviewed for medical necessity with respect to the patient's admission/duration of stay.    Next review date: 11/08/17  Debarah Crape, BSN, RN-BC  Case Manager

## 2017-11-04 NOTE — ED Notes (Signed)
Attempted to call report. Gwen from behavioral health reports they will take the patient after 1900. Patient resting. Even and non labored respirations noted. Patient denies needs at this time. Will continue to monitor.

## 2017-11-04 NOTE — ED Notes (Signed)
Pharmacy notified of need of medication. Will administer medication once it arrives from pharmacy.

## 2017-11-04 NOTE — Tx Team (Signed)
Initial Treatment Plan 11/04/2017 10:48 PM Danny Cook. UXL:[id number]    PATIENT STRESSORS: Financial difficulties Health problems Occupational concerns   PATIENT STRENGTHS: Average or above average intelligence Capable of independent living Communication skills Motivation for treatment/growth   PATIENT IDENTIFIED PROBLEMS: Suicide ideation    Depression and Anxiety    Poor health condition             DISCHARGE CRITERIA:  Adequate post-discharge living arrangements Improved stabilization in mood, thinking, and/or behavior Motivation to continue treatment in a less acute level of care Safe-care adequate arrangements made  PRELIMINARY DISCHARGE PLAN: Attend 12-step recovery group Outpatient therapy Return to previous living arrangement  PATIENT/FAMILY INVOLVEMENT: This treatment plan has been presented to and reviewed with the patient, Danny Cook.,   The patient  have been given the opportunity to ask questions and make suggestions.  Clemens Catholic, RN 11/04/2017, 10:48 PM

## 2017-11-04 NOTE — ED Notes (Signed)
[employer] contact is Charlton Amor and can be reached at [phone]

## 2017-11-04 NOTE — ED Triage Notes (Signed)
Pt comes into the ED via [facility] where the patient's [employer] person was doing a home visit and the patient informed her that he doesn't believe his medication is working and he feels like harming himself.  Patient states he was going to overdose as his plan.  Patient is seen outpatient often with [employer] care.  Patient lives by himself and felt unsafe.

## 2017-11-04 NOTE — Consult Note (Signed)
[facility] Face-to-Face Psychiatry Consult   Reason for Consult: Consult for 57 year old man who came to the emergency room stating he is having suicidal thoughts Referring Physician: Jimmye [place] Patient Identification: Danny Cook. MRN:  [id number] Principal Diagnosis: Schizoaffective disorder, depressive type [facility]) Diagnosis:   Patient Active Problem List   Diagnosis Date Noted  . Schizoaffective disorder, depressive type ([place]) [F25.1] 11/04/2017  . HIV positive ([place]) [Z21] 11/04/2017  . Orthostatic dizziness [R42] 07/31/2017  . Chronic diastolic heart failure ([place]) [I50.32] 07/29/2017  . HTN (hypertension) [I10] 07/29/2017  . Diabetes ([place]) [E11.9] 07/29/2017  . Schizophrenia ([place]) [F20.9] 07/29/2017    Total Time spent with patient: 1 hour  Subjective:   Danny Isenberg. is a 57 y.o. male patient admitted with "I am having suicidal thoughts".  HPI: Patient seen chart reviewed.  Patient with chronic mood disorder comes to the emergency room saying that he has been feeling depressed for over a month but it is gradually getting worse.  He feels overwhelmed by his multiple problems.  Has been having thoughts of overdosing on his medicine.  Patient has major stresses from living independently and worrying constantly about his money and feeling lonely.  He does have outpatient psychiatric treatment and says he is compliant with his prescribed medicine.  He is not using alcohol or drugs.  He has vague paranoia but no hallucinations.  No homicidal ideation.  Social history: Patient is disabled lives by himself.  He is only been living by himself for less than a year and finds it stressful.  Medical history: Patient is HIV positive also has diabetes requiring insulin and chronic congestive heart failure and high blood pressure  Substance abuse history: Quit drinking about 15 years ago.  Before that had a problem with it does not drink or use any drugs currently.  Past Psychiatric History:  Long-standing mental health problems with various diagnoses but the patient feels that schizoaffective disorder is the most recent and correct one.  His last psychiatric hospitalization was about 20 years ago.  Last suicide attempt was at age 28.  He is currently seeing Dr. Collie Siad in [place] to Self: Is patient at risk for suicide?: Yes Risk to Others:   Prior Inpatient Therapy:   Prior Outpatient Therapy:    Past Medical History:  Past Medical History:  Diagnosis Date  . Anemia   . Anxiety   . Arthritis   . CHF (congestive heart failure) ([place])   . Chronic kidney disease    Renal Insufficiency Syndrome; Glomerulosclerosis 2013  . Complication of anesthesia   . Depressed   . Diabetes mellitus without complication ([place])   . High cholesterol   . HIV (human immunodeficiency virus infection) ([place])   . Hypertension   . 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 Paranoid disorder ([place])   . Schizophrenia, paranoid ([place])   . Sleep apnea     Past Surgical History:  Procedure Laterality Date  . COLONOSCOPY WITH PROPOFOL N/A 09/03/2015   Procedure: COLONOSCOPY WITH PROPOFOL;  Surgeon: Lollie Sails, MD;  Location: [facility] ENDOSCOPY;  Service: Endoscopy;  Laterality: N/A;  . DG TEETH FULL    . ESOPHAGOGASTRODUODENOSCOPY (EGD) WITH PROPOFOL N/A 07/01/2016   Procedure: ESOPHAGOGASTRODUODENOSCOPY (EGD) WITH PROPOFOL;  Surgeon: Lollie Sails, MD;  Location: [facility] ENDOSCOPY;  Service: Endoscopy;  Laterality: N/A;   Family History:  Family History  Problem Relation Age of Onset  . Heart attack Mother   . Diabetes Mellitus II Mother   .  Mental illness Mother   . CAD Mother   . Heart attack Father   . CAD Father   . Hypertension Father    Family Psychiatric  History: Mother had depression Social History:  Social History   Substance and Sexual Activity  Alcohol Use No     Social History   Substance and Sexual Activity  Drug Use No    Social History   Socioeconomic History  . Marital  status: Single    Spouse name: Not on file  . Number of children: Not on file  . Years of education: 35  . Highest education level: GED or equivalent  Occupational History  . Not on file  Social Needs  . Financial resource strain: Not hard at all  . Food insecurity:    Worry: Sometimes true    Inability: Never true  . Transportation needs:    Medical: No    Non-medical: No  Tobacco Use  . Smoking status: Never Smoker  . Smokeless tobacco: Never Used  Substance and Sexual Activity  . Alcohol use: No  . Drug use: No  . Sexual activity: Never  Lifestyle  . Physical activity:    Days per week: 0 days    Minutes per session: 0 min  . Stress: To some extent  Relationships  . Social connections:    Talks on phone: More than three times a week    Gets together: Three times a week    Attends religious service: Never    Active member of club or organization: No    Attends meetings of clubs or organizations: Never    Relationship status: Never married  Other Topics Concern  . Not on file  Social History Narrative  . Not on file   Additional Social History:    Allergies:   Allergies  Allergen Reactions  . Trazodone And Nefazodone   . Viread [Tenofovir Disoproxil]     Damages kidneys  . Etodolac Rash    Labs:  Results for orders placed or performed during the hospital encounter of 11/04/17 (from the past 48 hour(s))  Comprehensive metabolic panel     Status: Abnormal   Collection Time: 11/04/17  1:15 PM  Result Value Ref Range   Sodium 139 135 - 145 mmol/L   Potassium 4.0 3.5 - 5.1 mmol/L   Chloride 103 101 - 111 mmol/L   CO2 23 22 - 32 mmol/L   Glucose, Bld 87 65 - 99 mg/dL   BUN 24 (H) 6 - 20 mg/dL   Creatinine, Ser 1.43 (H) 0.61 - 1.24 mg/dL   Calcium 9.3 8.9 - 10.3 mg/dL   Total Protein 8.2 (H) 6.5 - 8.1 g/dL   Albumin 4.3 3.5 - 5.0 g/dL   AST 29 15 - 41 U/L   ALT 19 17 - 63 U/L   Alkaline Phosphatase 92 38 - 126 U/L   Total Bilirubin 0.5 0.3 - 1.2 mg/dL    GFR calc non Af Amer 52 (L) >60 mL/min   GFR calc Af Amer >60 >60 mL/min    Comment: (NOTE) The eGFR has been calculated using the CKD EPI equation. This calculation has not been validated in all clinical situations. eGFR's persistently <60 mL/min signify possible Chronic Kidney Disease.    Anion gap 13 5 - 15    Comment: Performed at [facility], [address] [place], [place] [address]  Ethanol     Status: None   Collection Time: 11/04/17  1:15 PM  Result Value Ref Range   Alcohol, Ethyl (B) <10 <10 mg/dL    Comment:        LOWEST DETECTABLE LIMIT FOR SERUM ALCOHOL IS 10 mg/dL FOR MEDICAL PURPOSES ONLY Performed at [facility], [address] [place], [place] [address]   Salicylate level     Status: None   Collection Time: 11/04/17  1:15 PM  Result Value Ref Range   Salicylate Lvl <5.6 2.8 - 30.0 mg/dL    Comment: Performed at [facility], [address] [place], [place] [address]  Acetaminophen level     Status: Abnormal   Collection Time: 11/04/17  1:15 PM  Result Value Ref Range   Acetaminophen (Tylenol), Serum <10 (L) 10 - 30 ug/mL    Comment:        THERAPEUTIC CONCENTRATIONS VARY SIGNIFICANTLY. A RANGE OF 10-30 ug/mL MAY BE AN EFFECTIVE CONCENTRATION FOR MANY PATIENTS. HOWEVER, SOME ARE BEST TREATED AT CONCENTRATIONS OUTSIDE THIS RANGE. ACETAMINOPHEN CONCENTRATIONS >150 ug/mL AT 4 HOURS AFTER INGESTION AND >50 ug/mL AT 12 HOURS AFTER INGESTION ARE OFTEN ASSOCIATED WITH TOXIC REACTIONS. Performed at [facility], [address] [place], [place] [address]   cbc     Status: Abnormal   Collection Time: 11/04/17  1:15 PM  Result Value Ref Range   WBC 7.9 3.8 - 10.6 K/uL   RBC 4.20 (L) 4.40 - 5.90 MIL/uL   Hemoglobin 13.5 13.0 - 18.0 g/dL   HCT 39.1 (L) 40.0 - 52.0 %   MCV 93.0 80.0 - 100.0 fL   MCH 32.0 26.0 - 34.0 pg   MCHC 34.4 32.0 - 36.0 g/dL   RDW 13.8 11.5 - 14.5 %   Platelets 243 150 - 440 K/uL     Comment: Performed at [facility], [address], [place], [place] [address]    Current Facility-Administered Medications  Medication Dose Route Frequency Provider Last Rate Last Dose  . abacavir-dolutegravir-lamiVUDine (TRIUMEQ) [id number] MG per tablet 1 tablet  1 tablet Oral Daily Sunjai Levandoski T, MD      . amitriptyline (ELAVIL) tablet 25 mg  25 mg Oral QHS Marquan Vokes T, MD      . aspirin EC tablet 81 mg  81 mg Oral Daily Burney Calzadilla T, MD      . clonazePAM (KLONOPIN) tablet 1 mg  1 mg Oral TID Channell Quattrone T, MD      . divalproex (DEPAKOTE) DR tablet 500 mg  500 mg Oral Q12H Otoniel Myhand T, MD      . gabapentin (NEURONTIN) capsule 300 mg  300 mg Oral TID Rilley Poulter T, MD      . gemfibrozil (LOPID) tablet 600 mg  600 mg Oral BID AC Selvin Yun T, MD      . Derrill Memo ON 11/05/2017] glimepiride (AMARYL) tablet 4 mg  4 mg Oral Q breakfast Ryin Schillo T, MD      . iloperidone (FANAPT) tablet 12 mg  12 mg Oral BID Murle Hellstrom T, MD      . insulin aspart (novoLOG) injection 0-15 Units  0-15 Units Subcutaneous TID WC Ja Pistole T, MD      . Derrill Memo ON 11/05/2017] insulin degludec (TRESIBA) 100 UNIT/ML FlexTouch Pen 84 Units  84 Units Subcutaneous BH-q7a Elaine Middleton T, MD      . lisinopril (PRINIVIL,ZESTRIL) tablet 20 mg  20 mg Oral Daily Bilan Tedesco T, MD      . metFORMIN (GLUCOPHAGE) tablet 1,000 mg  1,000 mg Oral  BID WC Teyah Rossy T, MD      . mirtazapine (REMERON) tablet 30 mg  30 mg Oral QHS Siddharth Babington T, MD      . pantoprazole (PROTONIX) EC tablet 40 mg  40 mg Oral Daily Sharlisa Hollifield T, MD      . rosuvastatin (CRESTOR) tablet 20 mg  20 mg Oral q1800 Searra Carnathan T, MD      . vitamin B-12 (CYANOCOBALAMIN) tablet 1,000 mcg  1,000 mcg Oral Daily Abdoul Encinas, Madie Reno, MD       Current Outpatient Medications  Medication Sig Dispense Refill  . Abacavir-Dolutegravir-Lamivud (TRIUMEQ) 600-50-300 MG TABS Take 1 tablet by mouth daily.    [name] amitriptyline (ELAVIL) 25 MG  tablet Take 1 tablet by mouth at bedtime.    [name] amoxicillin-clavulanate (AUGMENTIN) 875-125 MG tablet Take 1 tablet by mouth every 12 (twelve) hours. 17 tablet 0  . aspirin 325 MG tablet Take 325 mg by mouth daily.    . clonazePAM (KLONOPIN) 2 MG tablet Take 2 mg by mouth 2 (two) times daily.    . cyanocobalamin 1000 MCG tablet Take 1,000 mcg by mouth daily.    . divalproex (DEPAKOTE ER) 250 MG 24 hr tablet Take 500 mg by mouth 2 (two) times daily.     [name] FANAPT 10 MG TABS Take 12 mg by mouth 2 (two) times daily.     . furosemide (LASIX) 20 MG tablet Take 10 mg by mouth daily.    [name] gabapentin (NEURONTIN) 300 MG capsule Take 300 mg by mouth 3 (three) times daily.    [name] gemfibrozil (LOPID) 600 MG tablet Take 1 tablet by mouth 2 (two) times daily.    . Insulin Degludec 200 UNIT/ML SOPN Inject 20 Units into the skin daily.    [name] lisinopril (PRINIVIL,ZESTRIL) 20 MG tablet Take 20 mg by mouth daily.    . meclizine (ANTIVERT) 25 MG tablet Take 1 tablet (25 mg total) by mouth 3 (three) times daily as needed for dizziness or nausea. 30 tablet 1  . metFORMIN (GLUCOPHAGE) 1000 MG tablet Take 1,000 mg by mouth 2 (two) times daily with a meal.    . mirtazapine (REMERON) 30 MG tablet Take 30 mg by mouth at bedtime.    [name] omeprazole (PRILOSEC) 40 MG capsule Take 40 mg by mouth 2 (two) times daily.    . ondansetron (ZOFRAN ODT) 4 MG disintegrating tablet Take 1 tablet (4 mg total) by mouth every 8 (eight) hours as needed for nausea or vomiting. 20 tablet 0  . promethazine (PHENERGAN) 6.25 MG/5ML syrup Take 6.25 mg by mouth every 6 (six) hours as needed for nausea or vomiting.    . rosuvastatin (CRESTOR) 20 MG tablet Take 20 mg by mouth daily.    . sucralfate (CARAFATE) 1 g tablet Take 1 g by mouth 2 (two) times daily.    [name] triamcinolone cream (KENALOG) 0.1 % Apply 1 application topically 2 (two) times daily as needed. 30 g 0    Musculoskeletal: Strength & Muscle Tone: within normal limits Gait & Station:  normal Patient leans: N/A  Psychiatric Specialty Exam: Physical Exam  Nursing note and vitals reviewed. Constitutional: He appears well-developed and well-nourished.  HENT:  Head: Normocephalic and atraumatic.  Eyes: Pupils are equal, round, and reactive to light. Conjunctivae are normal.  Neck: Normal range of motion.  Cardiovascular: Regular rhythm and normal heart sounds.  Respiratory: Effort normal. No respiratory distress.  GI: Soft.  Musculoskeletal: Normal range of motion.  Neurological:  He is alert.  Skin: Skin is warm and dry.  Psychiatric: His affect is blunt. His speech is delayed. He is slowed. Cognition and memory are normal. He expresses impulsivity. He exhibits a depressed mood. He expresses suicidal ideation. He expresses suicidal plans.    Review of Systems  Constitutional: Negative.   HENT: Negative.   Eyes: Negative.   Respiratory: Negative.   Cardiovascular: Negative.   Gastrointestinal: Negative.   Musculoskeletal: Negative.   Skin: Negative.   Neurological: Negative.   Psychiatric/Behavioral: Positive for depression and suicidal ideas. Negative for hallucinations, memory loss and substance abuse. The patient is nervous/anxious and has insomnia.     Blood pressure 104/78, pulse 77, temperature 98.4 F (36.9 C), temperature source Oral, resp. rate 16, height 5' 7"  (1.702 m), weight 97.1 kg (214 lb), SpO2 99 %.Body mass index is 33.52 kg/m.  General Appearance: Casual  Eye Contact:  Good  Speech:  Slow  Volume:  Normal  Mood:  Depressed  Affect:  Congruent  Thought Process:  Goal Directed  Orientation:  Full (Time, Place, and Person)  Thought Content:  Logical  Suicidal Thoughts:  Yes.  with intent/plan  Homicidal Thoughts:  No  Memory:  Immediate;   Fair Recent;   Fair Remote;   Fair  Judgement:  Fair  Insight:  Fair  Psychomotor Activity:  Decreased  Concentration:  Concentration: Fair  Recall:  [employer] of Knowledge:  Fair  Language:  Fair   Akathisia:  No  Handed:  Right  AIMS (if indicated):     Assets:  Communication Skills Desire for Improvement Housing Resilience  ADL's:  Intact  Cognition:  WNL  Sleep:        Treatment Plan Summary: Daily contact with patient to assess and evaluate symptoms and progress in treatment, Medication management and Plan 57 year old man with a history of schizoaffective disorder is reporting depressive symptoms with active thoughts of killing himself.  Seems down depressed and withdrawn.  No very specific clear new stressor.  Patient meets criteria for admission.  Admission orders completed.  Continue outpatient medicine.  Not all of his medicines, particularly Victoza, are available so we will try to do our best to replace it with sliding scale.  Continue usual psychiatric medicine if possible.  Full set of labs can be done.  Disposition: Recommend psychiatric Inpatient admission when medically cleared. Supportive therapy provided about ongoing stressors.  Alethia Berthold, MD 11/04/2017 4:18 PM

## 2017-11-04 NOTE — ED Provider Notes (Addendum)
[facility] Emergency Department Provider Note       Time seen: ----------------------------------------- 2:24 PM on 11/04/2017 -----------------------------------------   I have reviewed the triage vital signs and the nursing notes.  HISTORY   Chief Complaint Suicidal    HPI Danny Mo. is a 57 y.o. male with a history of anemia, anxiety, arthritis, CHF, chronic kidney disease, depression, diabetes, hyperlipidemia, HIV, schizophrenia and paranoid disorder who presents to the ED for thoughts of harming himself.  [facility] where he was being evaluated and indicated that he does not believe his medications are working and he feels like harming himself.  He was going to overdose to kill himself.  He lives by himself and felt unsafe.  He denies any medical complaints or recent illness.  Past Medical History:  Diagnosis Date  . Anemia   . Anxiety   . Arthritis   . CHF (congestive heart failure) ([place])   . Chronic kidney disease    Renal Insufficiency Syndrome; Glomerulosclerosis 2013  . Complication of anesthesia   . Depressed   . Diabetes mellitus without complication ([place])   . High cholesterol   . HIV (human immunodeficiency virus infection) ([place])   . Hypertension   . 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 Paranoid disorder ([place])   . Schizophrenia, paranoid ([place])   . Sleep apnea     Patient Active Problem List   Diagnosis Date Noted  . Orthostatic dizziness 07/31/2017  . Chronic diastolic heart failure ([place]) 07/29/2017  . HTN (hypertension) 07/29/2017  . Diabetes ([place]) 07/29/2017  . Schizophrenia ([place]) 07/29/2017    Past Surgical History:  Procedure Laterality Date  . COLONOSCOPY WITH PROPOFOL N/A 09/03/2015   Procedure: COLONOSCOPY WITH PROPOFOL;  Surgeon: Lollie Sails, MD;  [facility];  Service: Endoscopy;  Laterality: N/A;  . DG TEETH FULL    . ESOPHAGOGASTRODUODENOSCOPY (EGD) WITH PROPOFOL N/A  07/01/2016   Procedure: ESOPHAGOGASTRODUODENOSCOPY (EGD) WITH PROPOFOL;  Surgeon: Lollie Sails, MD;  Location: [facility] ENDOSCOPY;  Service: Endoscopy;  Laterality: N/A;    Allergies Trazodone and nefazodone; Viread [tenofovir disoproxil]; and Etodolac  Social History Social History   Tobacco Use  . Smoking status: Never Smoker  . Smokeless tobacco: Never Used  Substance Use Topics  . Alcohol use: No  . Drug use: No   Review of Systems Constitutional: Negative for fever. Cardiovascular: Negative for chest pain. Respiratory: Negative for shortness of breath. Gastrointestinal: Negative for abdominal pain, vomiting and diarrhea. Musculoskeletal: Negative for back pain. Skin: Negative for rash. Neurological: Negative for headaches, focal weakness or numbness. Psychiatric: Positive for depression and suicidal thoughts  All systems negative/normal/unremarkable except as stated in the HPI  ____________________________________________   PHYSICAL EXAM:  VITAL SIGNS: ED Triage Vitals  Enc Vitals Group     BP 11/04/17 1317 (!) 95/57     Pulse Rate 11/04/17 1317 (!) 101     Resp 11/04/17 1317 16     Temp 11/04/17 1317 98.4 F (36.9 C)     Temp Source 11/04/17 1317 Oral     SpO2 11/04/17 1317 96 %     Weight 11/04/17 1313 214 lb (97.1 kg)     Height 11/04/17 1313 5\' 7"  (1.702 m)     Head Circumference --      Peak Flow --      Pain Score 11/04/17 1313 9     Pain Loc --      Pain Edu? --  Excl. in [place]? --    Constitutional: Alert and oriented. Well appearing and in no distress. Eyes: Conjunctivae are normal. Normal extraocular movements. Cardiovascular: Normal rate, regular rhythm. No murmurs, rubs, or gallops. Respiratory: Normal respiratory effort without tachypnea nor retractions. Breath sounds are clear and equal bilaterally. No wheezes/rales/rhonchi. Gastrointestinal: Soft and nontender. Normal bowel sounds Musculoskeletal: Nontender with normal range of motion in  extremities. No lower extremity tenderness nor edema. Neurologic:  Normal speech and language. No gross focal neurologic deficits are appreciated.  Skin:  Skin is warm, dry and intact. No rash noted. Psychiatric: Depressed mood and affect ____________________________________________  ED COURSE:  As part of my medical decision making, I reviewed the following data within the [place] History obtained from family if available, nursing notes, old chart and ekg, as well as notes from prior ED visits. Patient presented for depression and suicidal ideation, we will assess with labs and imaging as indicated at this time.   Procedures ____________________________________________   LABS (pertinent positives/negatives)  Labs Reviewed  COMPREHENSIVE METABOLIC PANEL - Abnormal; Notable for the following components:      Result Value   BUN 24 (*)    Creatinine, Ser 1.43 (*)    Total Protein 8.2 (*)    GFR calc non Af Amer 52 (*)    All other components within normal limits  CBC - Abnormal; Notable for the following components:   RBC 4.20 (*)    HCT 39.1 (*)    All other components within normal limits  ETHANOL  SALICYLATE LEVEL  ACETAMINOPHEN LEVEL  URINE DRUG SCREEN, QUALITATIVE ([facility] ONLY)  ____________________________________________  DIFFERENTIAL DIAGNOSIS   Depression, suicidal ideation, anxiety  FINAL ASSESSMENT AND PLAN  Depression, suicidal thought   Plan: The patient had presented for feeling unsafe at home due to his plans to overdose and harm himself. Patient's labs do not reveal any acute process, he is medically stable for psychiatric evaluation and disposition.   Laurence Aly, MD   Note: This note was generated in part or whole with voice recognition software. Voice recognition is usually quite accurate but there are transcription errors that can and very often do occur. I apologize for any typographical errors that were not detected and  corrected.     Earleen Newport, MD 11/04/17 1429    Earleen Newport, MD 11/04/17 [phone]

## 2017-11-04 NOTE — BH Assessment (Addendum)
Assessment Note  Danny Cook. is an 57 y.o. male. Patient presents to [facility] voluntarily due to depression characterized by suicidal ideation. Patient endorses a suicidal plan to overdose on medication. Patient reports his suicidal ideation has worsened over the past few weeks. Patient reports symptoms of his depression are isolation and feelings of worthlessness and hopelessness. Patient states he is afraid he will kill himself if he goes home, so he came to the ED. Patient endorses a suicide attempt at the age of 22 via overdose. Patient endorses a history of AVH. Patient denies HI.  Patient doesn't have any current involvement in the legal system.  Patient denies substance and alcohol use.   Patient presented cooperative with a depressed affect during assessment.   Diagnosis: Schizoaffective Disorder  Past Medical History:  Past Medical History:  Diagnosis Date  . Anemia   . Anxiety   . Arthritis   . CHF (congestive heart failure) ([place])   . Chronic kidney disease    Renal Insufficiency Syndrome; Glomerulosclerosis 2013  . Complication of anesthesia   . Depressed   . Diabetes mellitus without complication ([place])   . High cholesterol   . HIV (human immunodeficiency virus infection) ([place])   . Hypertension   . 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 Paranoid disorder ([place])   . Schizophrenia, paranoid ([place])   . Sleep apnea     Past Surgical History:  Procedure Laterality Date  . COLONOSCOPY WITH PROPOFOL N/A 09/03/2015   Procedure: COLONOSCOPY WITH PROPOFOL;  Surgeon: Lollie Sails, MD;  Location: [facility] ENDOSCOPY;  Service: Endoscopy;  Laterality: N/A;  . DG TEETH FULL    . ESOPHAGOGASTRODUODENOSCOPY (EGD) WITH PROPOFOL N/A 07/01/2016   Procedure: ESOPHAGOGASTRODUODENOSCOPY (EGD) WITH PROPOFOL;  Surgeon: Lollie Sails, MD;  Location: [facility] ENDOSCOPY;  Service: Endoscopy;  Laterality: N/A;    Family History:  Family History  Problem Relation Age of Onset  . Heart attack Mother    . Diabetes Mellitus II Mother   . Mental illness Mother   . CAD Mother   . Heart attack Father   . CAD Father   . Hypertension Father     Social History:  reports that he has never smoked. He has never used smokeless tobacco. He reports that he does not drink alcohol or use drugs.  Additional Social History:  Alcohol / Drug Use Pain Medications: SEE PTA  Prescriptions: SEE PTA  Over the Counter: SEE PTA  History of alcohol / drug use?: No history of alcohol / drug abuse  CIWA: CIWA-Ar BP: 104/78 Pulse Rate: 77 COWS:    Allergies:  Allergies  Allergen Reactions  . Trazodone And Nefazodone   . Viread [Tenofovir Disoproxil]     Damages kidneys  . Etodolac Rash    Home Medications:  (Not in a hospital admission)  OB/GYN Status:  No LMP for male patient.  General Assessment Data Assessment unable to be completed: (Assessment Completed ) Location of Assessment: [facility] ED TTS Assessment: In system Is this a Tele or Face-to-Face Assessment?: Face-to-Face Is this an Initial Assessment or a Re-assessment for this encounter?: Initial Assessment Marital status: Single Maiden name: N/A Is patient pregnant?: No Pregnancy Status: No Living Arrangements: Alone Can pt return to current living arrangement?: Yes Admission Status: Voluntary Is patient capable of signing voluntary admission?: Yes Referral Source: Self/Family/Friend Insurance type: Medicare  Medical Screening Exam ([place]) Medical Exam completed: Yes  Crisis Care Plan Living Arrangements: Alone Legal Guardian: Other:(None reported) Name of  Psychiatrist: [facility], [place], [place]  Name of Therapist: [facility], [place], [place]   Education Status Is patient currently in school?: No Is the patient employed, unemployed or receiving disability?: Receiving disability income, Unemployed  Risk to self with the past 6 months Suicidal Ideation: Yes-Currently Present Has patient been a risk to self within the past 6  months prior to admission? : Yes Suicidal Intent: Yes-Currently Present Has patient had any suicidal intent within the past 6 months prior to admission? : Yes Is patient at risk for suicide?: Yes Suicidal Plan?: Yes-Currently Present Has patient had any suicidal plan within the past 6 months prior to admission? : Yes Specify Current Suicidal Plan: OD on medications Access to Means: Yes Specify Access to Suicidal Means: prescribed medication What has been your use of drugs/alcohol within the last 12 months?: None reported Previous Attempts/Gestures: No How many times?: 0 Other Self Harm Risks: None reported Triggers for Past Attempts: Unpredictable Intentional Self Injurious Behavior: None Family Suicide History: No Recent stressful life event(s): Other (Comment) Persecutory voices/beliefs?: No Depression: Yes Depression Symptoms: Isolating, Feeling worthless/self pity, Loss of interest in usual pleasures Substance abuse history and/or treatment for substance abuse?: No Suicide prevention information given to non-admitted patients: Not applicable  Risk to Others within the past 6 months Homicidal Ideation: No Does patient have any lifetime risk of violence toward others beyond the six months prior to admission? : No Thoughts of Harm to Others: No Current Homicidal Intent: No Current Homicidal Plan: No Access to Homicidal Means: No Identified Victim: None reported History of harm to others?: No Assessment of Violence: None Noted Violent Behavior Description: None reported Does patient have access to weapons?: No     Mental Status Report Motor Activity: Freedom of movement     ADLScreening [facility] Assessment Services) Patient's cognitive ability adequate to safely complete daily activities?: Yes Patient able to express need for assistance with ADLs?: Yes Independently performs ADLs?: Yes (appropriate for developmental age)  Prior Inpatient Therapy Prior Inpatient Therapy:  Yes Prior Therapy Facilty/Provider(s): [facility]  Reason for Treatment: Depression  Prior Outpatient Therapy Prior Outpatient Therapy: Yes Prior Therapy Facilty/Provider(s): current Reason for Treatment: Depression Does patient have an ACCT team?: No Does patient have Intensive In-House Services?  : No Does patient have [facility] services? : No Does patient have P4CC services?: No  ADL Screening (condition at time of admission) Patient's cognitive ability adequate to safely complete daily activities?: Yes Is the patient deaf or have difficulty hearing?: No Does the patient have difficulty seeing, even when wearing glasses/contacts?: No Does the patient have difficulty concentrating, remembering, or making decisions?: No Patient able to express need for assistance with ADLs?: Yes Does the patient have difficulty dressing or bathing?: No Independently performs ADLs?: Yes (appropriate for developmental age) Does the patient have difficulty walking or climbing stairs?: No Weakness of Legs: None Weakness of Arms/Hands: None  Home Assistive Devices/Equipment Home Assistive Devices/Equipment: None  Therapy Consults (therapy consults require a physician order) PT Evaluation Needed: No OT Evalulation Needed: No SLP Evaluation Needed: No Abuse/Neglect Assessment (Assessment to be complete while patient is alone) Abuse/Neglect Assessment Can Be Completed: Yes Physical Abuse: Denies Verbal Abuse: Denies Sexual Abuse: Denies Exploitation of patient/patient's resources: Denies Self-Neglect: Denies Possible abuse reported to:: [place] department of social services Values / Beliefs Cultural Requests During Hospitalization: None Spiritual Requests During Hospitalization: None Consults Spiritual Care Consult Needed: No Social Work Consult Needed: No            Disposition:  Disposition Initial Assessment Completed for this Encounter: Yes Patient referred to: Other (Comment)([facility] )  On  Site Evaluation by:   Reviewed with Physician:    Jodie Echevaria , [place], LCAS-A 11/04/2017 5:28 PM

## 2017-11-05 DIAGNOSIS — F251 Schizoaffective disorder, depressive type: Principal | ICD-10-CM

## 2017-11-05 LAB — GLUCOSE, CAPILLARY
Glucose-Capillary: 109 mg/dL — ABNORMAL HIGH (ref 65–99)
Glucose-Capillary: 155 mg/dL — ABNORMAL HIGH (ref 65–99)
Glucose-Capillary: 119 mg/dL — ABNORMAL HIGH (ref 65–99)

## 2017-11-05 LAB — LIPID PANEL
CHOLESTEROL: 215 mg/dL — AB (ref 0–200)
HDL: 32 mg/dL — ABNORMAL LOW (ref >40)
LDL Cholesterol: 106 mg/dL — ABNORMAL HIGH (ref 0–99)
TRIGLYCERIDES: 387 mg/dL — AB (ref <150)
Total CHOL/HDL Ratio: 6.7 RATIO
VLDL: 77 mg/dL — AB (ref 0–40)

## 2017-11-05 LAB — HEMOGLOBIN A1C
Hgb A1c MFr Bld: 7.8 % — ABNORMAL HIGH (ref 4.8–5.6)
Mean Plasma Glucose: 177.16 mg/dL

## 2017-11-05 LAB — TSH: TSH: 3.261 u[IU]/mL (ref 0.350–4.500)

## 2017-11-05 MED ORDER — INSULIN GLARGINE 100 UNIT/ML ~~LOC~~ SOLN
84.0000 [IU] | SUBCUTANEOUS | Status: DC
  Administered 2017-11-05 – 2017-11-09 (×4): 84 [IU] via SUBCUTANEOUS
  Filled 2017-11-05 (×6): qty 0.84

## 2017-11-05 MED ORDER — CLONAZEPAM 0.5 MG PO TABS
0.5000 mg | ORAL_TABLET | Freq: Three times a day (TID) | ORAL | Status: DC
  Administered 2017-11-05 – 2017-11-09 (×12): 0.5 mg via ORAL
  Filled 2017-11-05 (×12): qty 1

## 2017-11-05 MED ORDER — DIPHENHYDRAMINE HCL 25 MG PO CAPS
50.0000 mg | ORAL_CAPSULE | Freq: Every evening | ORAL | Status: DC | PRN
  Administered 2017-11-05 – 2017-11-08 (×3): 50 mg via ORAL
  Filled 2017-11-05 (×4): qty 2

## 2017-11-05 MED ORDER — VENLAFAXINE HCL ER 75 MG PO CP24
75.0000 mg | ORAL_CAPSULE | Freq: Every day | ORAL | Status: DC
  Administered 2017-11-05 – 2017-11-09 (×5): 75 mg via ORAL
  Filled 2017-11-05 (×6): qty 1

## 2017-11-05 NOTE — Plan of Care (Signed)
Patient up ad lib ambulates the unit with rolling walker , alert and oriented x 4. Affect/mood sad but brightens upon approach. Denies SI/HI/AVH and pain at this time.. Reports that he slept fair last night without the use of a sleep aid. Appetite is fair, energy level is fair but his concentration is poor. Depression is a 7, hopelessness is rated a 10 and anxiety a 10.  His most important goal for today is to get his mind clear enough to know what is best for him. Milieu remains safe with q 15 minute check.

## 2017-11-05 NOTE — BHH Group Notes (Signed)
LCSW Group Therapy Note 11/05/2017 9:00 AM  Type of Therapy and Topic:  Group Therapy:  Setting Goals  Participation Level:  Did Not Attend  Description of Group: In this process group, patients discussed using strengths to work toward goals and address challenges.  Patients identified two positive things about themselves and one goal they were working on.  Patients were given the opportunity to share openly and support each other's plan for self-empowerment.  The group discussed the value of gratitude and were encouraged to have a daily reflection of positive characteristics or circumstances.  Patients were encouraged to identify a plan to utilize their strengths to work on current challenges and goals.  Therapeutic Goals 1. Patient will verbalize personal strengths/positive qualities and relate how these can assist with achieving desired personal goals 2. Patients will verbalize affirmation of peers plans for personal change and goal setting 3. Patients will explore the value of gratitude and positive focus as related to successful achievement of goals 4. Patients will verbalize a plan for regular reinforcement of personal positive qualities and circumstances.  Summary of Patient Progress:  Sriman was invited to today's group, but chose not to attend.     Therapeutic Modalities Cognitive Behavioral Therapy Motivational Interviewing    Devona Konig, [place] 11/05/2017 10:41 AM

## 2017-11-05 NOTE — Progress Notes (Signed)
Recreation Therapy Notes   Date: 11/05/2017  Time: 9:30 am  Location: Craft Room  Behavioral response: Appropriate   Intervention Topic: Coping Skills  Discussion/Intervention:  Group content on today was focused on coping skills. The group defined what coping skills are and when they can be used. Individuals described how they normally cope with things and the coping skills they normally use. Patients expressed why it is important to cope with things and how not coping with things can affect you. The group participated in the intervention "Exploring coping skills" where they had a chance to test new coping skills they could use in the future.  Clinical Observations/Feedback:  Patient came to group and expressed that when he copes with something in a positive way he feels good afterward. Individual participated in the intervention during group.   Yuriana Gaal LRT/CTRS          Artur Winningham 11/05/2017 12:03 PM

## 2017-11-05 NOTE — BHH Suicide Risk Assessment (Signed)
Danny Cook INPATIENT:  Family/Significant Other Suicide Prevention Education  Suicide Prevention Education:  Education Completed; Danny Cook, pt's brother, at [phone] has been identified by the patient as the family member/significant other with whom the patient will be residing, and identified as the person(s) who will aid the patient in the event of a mental health crisis (suicidal ideations/suicide attempt).  With written consent from the patient, the family member/significant other has been provided the following suicide prevention education, prior to the and/or following the discharge of the patient.  The suicide prevention education provided includes the following:  Suicide risk factors  Suicide prevention and interventions  National Suicide Hotline telephone number  [facility] assessment telephone number  [facility] Emergency Assistance [place] and/or Residential Mobile Crisis Unit telephone number  Request made of family/significant other to:  Remove weapons (e.g., guns, rifles, knives), all items previously/currently identified as safety concern.    Remove drugs/medications (over-the-counter, prescriptions, illicit drugs), all items previously/currently identified as a safety concern.  The family member/significant other verbalizes understanding of the suicide prevention education information provided.  The family member/significant other agrees to remove the items of safety concern listed above.  Pt's brother added, "He's been like that for a long time. He had heavy drug abuse when he was younger--he was using a little bit of everything: cocaine, heroin, angel dust. This is where it's put him, more or less. He feels like people are watching him or he'll feel really down. Usually he comes to my house weekly. And, I haven't talked to him in a while. I know he has a lot of health problems as well. But, I didn't know it was to the point of him being  suicidal. I can't think of anything that's happened recently. He's been depressed for a long time--and it sounds like maybe it's gotten worse. He was living in a group home until about a year ago. Now, he's in an apartment. I know he gets lonely sometimes in there. He's moved a lot. It's hard to say what's happened recently." Pt's brother confirmed pt does not have access to weapons/other means of self harm. [name] will continue to coordinate with pt's brother as needed for updates/discharge planning.   Alden Hipp, LCSW 11/05/2017, 9:02 AM

## 2017-11-05 NOTE — Progress Notes (Signed)
New admit with Dx. Thought of over dose himself with prescription pills, live by himself and felt unsafe, present with multiple medical issues, including  IDDM, HIV positive, paranoid depression and anxiety. Patient denies alcohol and drug use, educate patient on safety and nutrition, verbalize understanding of information giver to him, patient denies any suicidal tendencies and contract for safety of self and others no signs of AVH at this time, patient is resting comfortably in room 20, no distress noted.

## 2017-11-05 NOTE — Progress Notes (Signed)
Per Dr. Weber Cooks, patient had a sitter order that was carried over from when he was in the ED. "He no longer requires a sitter, I will discontinue this order." This information will be forwarded to the on-coming shift.
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
## 2017-11-05 NOTE — [facility] Group Notes (Signed)
[facility] Group Notes:  (Nursing/MHT/Case Management/Adjunct)  Date:  11/05/2017  Time:  12:29 AM  Type of Therapy:  Group Therapy  Participation Level:  Did Not Attend   Summary of Progress/Problems:  Danny Cook 11/05/2017, 12:29 AM

## 2017-11-05 NOTE — [facility] Group Notes (Signed)
  11/05/2017  Time: 1PM  Type of Therapy/Topic:  Group Therapy:  Balance in Life  Participation Level:  Did Not Attend  Description of Group:   This group will address the concept of balance and how it feels and looks when one is unbalanced. Patients will be encouraged to process areas in their lives that are out of balance and identify reasons for remaining unbalanced. Facilitators will guide patients in utilizing problem-solving interventions to address and correct the stressor making their life unbalanced. Understanding and applying boundaries will be explored and addressed for obtaining and maintaining a balanced life. Patients will be encouraged to explore ways to assertively make their unbalanced needs known to significant others in their lives, using other group members and facilitator for support and feedback.  Therapeutic Goals: 1. Patient will identify two or more emotions or situations they have that consume much of in their lives. 2. Patient will identify signs/triggers that life has become out of balance:  3. Patient will identify two ways to set boundaries in order to achieve balance in their lives:  4. Patient will demonstrate ability to communicate their needs through discussion and/or role plays  Summary of Patient Progress: Pt was invited to attend group but chose not to attend. [name] will continue to encourage pt to attend group throughout their admission.    Therapeutic Modalities:   Cognitive Behavioral Therapy Solution-Focused Therapy Assertiveness Training  Alden Hipp, MSW, LCSW Clinical Social Worker 11/05/2017 2:04 PM
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
## 2017-11-05 NOTE — [facility] Suicide Risk Assessment (Signed)
[facility] Admission Suicide Risk Assessment   Nursing information obtained from:  Patient Demographic factors:  Living alone Current Mental Status:  Self-harm thoughts Loss Factors:  Financial problems / change in socioeconomic status Historical Factors:  NA Risk Reduction Factors:  Positive coping skills or problem solving skills  Total Time spent with patient: 1 hour Principal Problem: Schizoaffective disorder, depressive type ([place]) Diagnosis:   Patient Active Problem List   Diagnosis Date Noted  . Schizoaffective disorder, depressive type ([place]) [F25.1] 11/04/2017    Priority: High  . HIV positive ([place]) [Z21] 11/04/2017  . Orthostatic dizziness [R42] 07/31/2017  . Chronic diastolic heart failure ([place]) [I50.32] 07/29/2017  . HTN (hypertension) [I10] 07/29/2017  . Diabetes ([place]) [E11.9] 07/29/2017  . Schizophrenia ([place]) [F20.9] 07/29/2017   Subjective Data: See H&P  Continued Clinical Symptoms:  Alcohol Use Disorder Identification Test Final Score (AUDIT): 5 The "Alcohol Use Disorders Identification Test", Guidelines for Use in Primary Care, Second Edition.  World Pharmacologist [facility]). Score between 0-7:  no or low risk or alcohol related problems. Score between 8-15:  moderate risk of alcohol related problems. Score between 16-19:  high risk of alcohol related problems. Score 20 or above:  warrants further diagnostic evaluation for alcohol dependence and treatment.   CLINICAL FACTORS:   Depression:   Insomnia   COGNITIVE FEATURES THAT CONTRIBUTE TO RISK:  None    SUICIDE RISK:   Moderate:  Frequent suicidal ideation with limited intensity, and duration, some specificity in terms of plans, no associated intent, good self-control, limited dysphoria/symptomatology, some risk factors present, and identifiable protective factors, including available and accessible social support.  PLAN OF CARE: See H&P  I certify that inpatient services furnished can reasonably be expected  to improve the patient's condition.   Marylin Crosby, MD 11/05/2017, 3:14 PM

## 2017-11-05 NOTE — Plan of Care (Signed)
New admit , improving in safety and nutrition, skin is clean no cuts or bruises and body search done with Burnard Bunting and Art therapist no contraband found, patient denies SI/HI and no signs of AVH and patient contract for safety of self and others. Problem: Education: Goal: Knowledge of General Education information will improve Outcome: Progressing   Problem: Nutrition: Goal: Adequate nutrition will be maintained Outcome: Progressing   Problem: Pain Managment: Goal: General experience of comfort will improve Outcome: Progressing   Problem: Safety: Goal: Ability to remain free from injury will improve Outcome: Progressing   Problem: Skin Integrity: Goal: Risk for impaired skin integrity will decrease Outcome: Progressing

## 2017-11-06 LAB — COMPREHENSIVE METABOLIC PANEL
ALK PHOS: 85 U/L (ref 38–126)
ALT: 16 U/L — AB (ref 17–63)
ANION GAP: 9 (ref 5–15)
AST: 17 U/L (ref 15–41)
Albumin: 4 g/dL (ref 3.5–5.0)
BILIRUBIN TOTAL: 0.5 mg/dL (ref 0.3–1.2)
BUN: 40 mg/dL — ABNORMAL HIGH (ref 6–20)
CALCIUM: 10 mg/dL (ref 8.9–10.3)
CO2: 27 mmol/L (ref 22–32)
Chloride: 101 mmol/L (ref 101–111)
Creatinine, Ser: 1.82 mg/dL — ABNORMAL HIGH (ref 0.61–1.24)
GFR calc non Af Amer: 39 mL/min — ABNORMAL LOW (ref >60)
GFR, EST AFRICAN AMERICAN: 45 mL/min — AB (ref >60)
GLUCOSE: 137 mg/dL — AB (ref 65–99)
Potassium: 4.6 mmol/L (ref 3.5–5.1)
Sodium: 137 mmol/L (ref 135–145)
TOTAL PROTEIN: 8 g/dL (ref 6.5–8.1)

## 2017-11-06 LAB — GLUCOSE, CAPILLARY
GLUCOSE-CAPILLARY: 232 mg/dL — AB (ref 65–99)
GLUCOSE-CAPILLARY: 96 mg/dL (ref 65–99)
Glucose-Capillary: 138 mg/dL — ABNORMAL HIGH (ref 65–99)
Glucose-Capillary: 149 mg/dL — ABNORMAL HIGH (ref 65–99)

## 2017-11-06 LAB — VALPROIC ACID LEVEL: Valproic Acid Lvl: 34 ug/mL — ABNORMAL LOW (ref 50.0–100.0)

## 2017-11-06 MED ORDER — VENLAFAXINE HCL ER 75 MG PO CP24: 75.0000 mg | ORAL_CAPSULE | Freq: Every day | ORAL | 0 refills | Status: DC

## 2017-11-06 MED ORDER — FUROSEMIDE 20 MG PO TABS
20.0000 mg | ORAL_TABLET | Freq: Every day | ORAL | Status: DC
  Administered 2017-11-07 – 2017-11-09 (×3): 20 mg via ORAL
  Filled 2017-11-06 (×3): qty 1

## 2017-11-06 NOTE — Progress Notes (Signed)
Recreation Therapy Notes  INPATIENT RECREATION THERAPY ASSESSMENT  Patient Details Name: Danny Cook. MRN: [id number] DOB: 28-Apr-1961 Today's Date: 11/06/2017       Information Obtained From: Patient  Able to Participate in Assessment/Interview: Yes  Patient Presentation: Responsive  Reason for Admission (Per Patient): Active Symptoms, Suicidal Ideation  Patient Stressors: (Living alone)  Coping Skills:   Art  Leisure Interests (2+):  Art - Paint, Art - Draw, Games - Bingo  Frequency of Recreation/Participation: Monthly  Awareness of Community Resources:  Yes  Community Resources:  Recreation Center  Current Use: Yes  If no, Barriers?:    Expressed Interest in [place]:    [employer] of Residence:  Insurance underwriter  Patient Main Form of Transportation:    Patient Strengths:     Patient Identified Areas of Improvement:     Patient Goal for Hospitalization:     Current SI (including self-harm):     Current HI:     Current AVH:    Staff Intervention Plan: Group Attendance, Collaborate with Interdisciplinary Treatment Team  Consent to Intern Participation: N/A  Khaliel Morey 11/06/2017, 2:46 PM

## 2017-11-06 NOTE — Progress Notes (Signed)
[facility] MD Progress Note  11/06/2017 12:58 PM Danny Cook.  MRN:  [id number] Subjective:  Pt states taht he is starting to feel better. He has brighter affect and smiling today. He states that the suicidal thoughts are resolving. His peer support worker came to visit and they are going to set up all sorts of activities for him which he is excited for. He is also going to look into volunteering. He has been attending groups and enjoying them. He states taht he feels much more relaxed and not overwhelmed. He states taht he is sleeping much better and the benadryl helped a lot. We discussed tapering off Klonopin which is my recommendation due to sleep apnea and he does not feel it is effective. He is taking 2 mg qam 1 mg qnoon and 2 mg qhs at home. He will half all of the doses and follow up with his psychiatrist for further tapering.   Principal Problem: Schizoaffective disorder, depressive type ([place]) Diagnosis:   Patient Active Problem List   Diagnosis Date Noted  . Schizoaffective disorder, depressive type ([place]) [F25.1] 11/04/2017    Priority: High  . HIV positive ([place]) [Z21] 11/04/2017  . Orthostatic dizziness [R42] 07/31/2017  . Chronic diastolic heart failure ([place]) [I50.32] 07/29/2017  . HTN (hypertension) [I10] 07/29/2017  . Diabetes ([place]) [E11.9] 07/29/2017  . Schizophrenia ([place]) [F20.9] 07/29/2017   Total Time spent with patient: 20 minutes  Past Psychiatric History: See H&P  Past Medical History:  Past Medical History:  Diagnosis Date  . Anemia   . Anxiety   . Arthritis   . CHF (congestive heart failure) ([place])   . Chronic kidney disease    Renal Insufficiency Syndrome; Glomerulosclerosis 2013  . Complication of anesthesia   . Depressed   . Diabetes mellitus without complication ([place])   . High cholesterol   . HIV (human immunodeficiency virus infection) ([place])   . Hypertension   . 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 Paranoid disorder ([place])   . Schizophrenia, paranoid ([place])   .  Sleep apnea     Past Surgical History:  Procedure Laterality Date  . COLONOSCOPY WITH PROPOFOL N/A 09/03/2015   Procedure: COLONOSCOPY WITH PROPOFOL;  Surgeon: Lollie Sails, MD;  Location: [facility] ENDOSCOPY;  Service: Endoscopy;  Laterality: N/A;  . DG TEETH FULL    . ESOPHAGOGASTRODUODENOSCOPY (EGD) WITH PROPOFOL N/A 07/01/2016   Procedure: ESOPHAGOGASTRODUODENOSCOPY (EGD) WITH PROPOFOL;  Surgeon: Lollie Sails, MD;  Location: [facility] ENDOSCOPY;  Service: Endoscopy;  Laterality: N/A;   Family History:  Family History  Problem Relation Age of Onset  . Heart attack Mother   . Diabetes Mellitus II Mother   . Mental illness Mother   . CAD Mother   . Heart attack Father   . CAD Father   . Hypertension Father    Family Psychiatric  History: See H&P Social History:  Social History   Substance and Sexual Activity  Alcohol Use No     Social History   Substance and Sexual Activity  Drug Use No    Social History   Socioeconomic History  . Marital status: Single    Spouse name: Not on file  . Number of children: Not on file  . Years of education: 68  . Highest education level: GED or equivalent  Occupational History  . Not on file  Social Needs  . Financial resource strain: Not hard at all  . Food insecurity:    Worry: Sometimes true  Inability: Never true  . Transportation needs:    Medical: No    Non-medical: No  Tobacco Use  . Smoking status: Never Smoker  . Smokeless tobacco: Never Used  Substance and Sexual Activity  . Alcohol use: No  . Drug use: No  . Sexual activity: Never  Lifestyle  . Physical activity:    Days per week: 0 days    Minutes per session: 0 min  . Stress: To some extent  Relationships  . Social connections:    Talks on phone: More than three times a week    Gets together: Three times a week    Attends religious service: Never    Active member of club or organization: No    Attends meetings of clubs or organizations: Never     Relationship status: Never married  Other Topics Concern  . Not on file  Social History Narrative  . Not on file   Additional Social History:                         Sleep: Good  Appetite:  Good  Current Medications: Current Facility-Administered Medications  Medication Dose Route Frequency Provider Last Rate Last Dose  . abacavir-dolutegravir-lamiVUDine (TRIUMEQ) [id number] MG per tablet 1 tablet  1 tablet Oral Daily Clapacs, Madie Reno, MD   1 tablet at 11/06/17 [phone]  . acetaminophen (TYLENOL) tablet 650 mg  650 mg Oral Q6H PRN Clapacs, John T, MD      . alum & mag hydroxide-simeth (MAALOX/MYLANTA) 200-200-20 MG/5ML suspension 30 mL  30 mL Oral Q4H PRN Clapacs, John T, MD      . aspirin EC tablet 81 mg  81 mg Oral Daily Clapacs, Madie Reno, MD   81 mg at 11/06/17 0839  . clonazePAM (KLONOPIN) tablet 0.5 mg  0.5 mg Oral TID Marylin Crosby, MD   0.5 mg at 11/06/17 1123  . diphenhydrAMINE (BENADRYL) capsule 50 mg  50 mg Oral QHS PRN Marylin Crosby, MD   50 mg at 11/05/17 2124  . divalproex (DEPAKOTE) DR tablet 500 mg  500 mg Oral Q12H Clapacs, Madie Reno, MD   500 mg at 11/06/17 0839  . [START ON 11/07/2017] furosemide (LASIX) tablet 20 mg  20 mg Oral Daily Alisi Lupien R, MD      . gabapentin (NEURONTIN) capsule 300 mg  300 mg Oral TID Clapacs, Madie Reno, MD   300 mg at 11/06/17 1123  . gemfibrozil (LOPID) tablet 600 mg  600 mg Oral BID AC Clapacs, Madie Reno, MD   600 mg at 11/06/17 0839  . glimepiride (AMARYL) tablet 4 mg  4 mg Oral Q breakfast Clapacs, John T, MD   4 mg at 11/06/17 0839  . hydrOXYzine (ATARAX/VISTARIL) tablet 50 mg  50 mg Oral TID PRN Clapacs, Madie Reno, MD   50 mg at 11/05/17 2124  . iloperidone (FANAPT) tablet 12 mg  12 mg Oral BID Clapacs, Madie Reno, MD   12 mg at 11/06/17 0837  . insulin aspart (novoLOG) injection 0-15 Units  0-15 Units Subcutaneous TID WC Clapacs, Madie Reno, MD   5 Units at 11/06/17 1121  . insulin glargine (LANTUS) injection 84 Units  84 Units Subcutaneous Redmond School, MD   84 Units at 11/06/17 [phone]  . lisinopril (PRINIVIL,ZESTRIL) tablet 20 mg  20 mg Oral Daily Clapacs, Madie Reno, MD   Stopped at 11/06/17 [phone]  . magnesium hydroxide (MILK OF MAGNESIA) suspension 30  mL  30 mL Oral Daily PRN Clapacs, John T, MD      . metFORMIN (GLUCOPHAGE) tablet 1,000 mg  1,000 mg Oral BID WC Clapacs, Madie Reno, MD   1,000 mg at 11/06/17 2297  . mirtazapine (REMERON) tablet 30 mg  30 mg Oral QHS Clapacs, Madie Reno, MD   30 mg at 11/05/17 2124  . pantoprazole (PROTONIX) EC tablet 40 mg  40 mg Oral Daily Clapacs, Madie Reno, MD   40 mg at 11/06/17 0839  . rosuvastatin (CRESTOR) tablet 20 mg  20 mg Oral q1800 Clapacs, Madie Reno, MD   20 mg at 11/05/17 1758  . venlafaxine XR (EFFEXOR-XR) 24 hr capsule 75 mg  75 mg Oral Q breakfast Zanna Hawn, Tyson Babinski, MD   75 mg at 11/06/17 0847  . vitamin B-12 (CYANOCOBALAMIN) tablet 1,000 mcg  1,000 mcg Oral Daily Clapacs, Madie Reno, MD   1,000 mcg at 11/06/17 [phone]    Lab Results:  Results for orders placed or performed during the hospital encounter of 11/04/17 (from the past 48 hour(s))  Glucose, capillary     Status: Abnormal   Collection Time: 11/05/17  7:05 AM  Result Value Ref Range   Glucose-Capillary 109 (H) 65 - 99 mg/dL   Comment 1 Notify RN   Hemoglobin A1c     Status: Abnormal   Collection Time: 11/05/17  7:52 AM  Result Value Ref Range   Hgb A1c MFr Bld 7.8 (H) 4.8 - 5.6 %    Comment: (NOTE) Pre diabetes:          5.7%-6.4% Diabetes:              >6.4% Glycemic control for   <7.0% adults with diabetes    Mean Plasma Glucose 177.16 mg/dL    Comment: Performed at [facility], [place] [address], [place], [place] [address]  Lipid panel     Status: Abnormal   Collection Time: 11/05/17  7:52 AM  Result Value Ref Range   Cholesterol 215 (H) 0 - 200 mg/dL   Triglycerides 387 (H) <150 mg/dL   HDL 32 (L) >40 mg/dL   Total CHOL/HDL Ratio 6.7 RATIO   VLDL 77 (H) 0 - 40 mg/dL   LDL Cholesterol 106 (H) 0 - 99 mg/dL    Comment:         Total Cholesterol/HDL:CHD Risk Coronary Heart Disease Risk Table                     Men   Women  1/2 Average Risk   3.4   3.3  Average Risk       5.0   4.4  2 X Average Risk   9.6   7.1  3 X Average Risk  23.4   11.0        Use the calculated Patient Ratio above and the CHD Risk Table to determine the patient's CHD Risk.        ATP III CLASSIFICATION (LDL):  <100     mg/dL   Optimal  100-129  mg/dL   Near or Above                    Optimal  130-159  mg/dL   Borderline  160-189  mg/dL   High  >190     mg/dL   Very High Performed at [facility], [address], [place], [place] [address]   TSH     Status: None  Collection Time: 11/05/17  7:52 AM  Result Value Ref Range   TSH 3.261 0.350 - 4.500 uIU/mL    Comment: Performed by a 3rd Generation assay with a functional sensitivity of <=0.01 uIU/mL. Performed at [facility], [address] [place], [place] [address]   Glucose, capillary     Status: Abnormal   Collection Time: 11/05/17 11:31 AM  Result Value Ref Range   Glucose-Capillary 155 (H) 65 - 99 mg/dL   Comment 1 Notify RN   Glucose, capillary     Status: Abnormal   Collection Time: 11/05/17  4:23 PM  Result Value Ref Range   Glucose-Capillary 119 (H) 65 - 99 mg/dL   Comment 1 Notify RN   Glucose, capillary     Status: Abnormal   Collection Time: 11/06/17  7:15 AM  Result Value Ref Range   Glucose-Capillary 138 (H) 65 - 99 mg/dL  Valproic acid level     Status: Abnormal   Collection Time: 11/06/17  7:21 AM  Result Value Ref Range   Valproic Acid Lvl 34 (L) 50.0 - 100.0 ug/mL    Comment: Performed at [facility], [address] [place], [place] [address]  Comprehensive metabolic panel     Status: Abnormal   Collection Time: 11/06/17  7:21 AM  Result Value Ref Range   Sodium 137 135 - 145 mmol/L   Potassium 4.6 3.5 - 5.1 mmol/L   Chloride 101 101 - 111 mmol/L   CO2 27 22 - 32 mmol/L   Glucose, Bld 137 (H) 65 - 99 mg/dL    BUN 40 (H) 6 - 20 mg/dL   Creatinine, Ser 1.82 (H) 0.61 - 1.24 mg/dL   Calcium 10.0 8.9 - 10.3 mg/dL   Total Protein 8.0 6.5 - 8.1 g/dL   Albumin 4.0 3.5 - 5.0 g/dL   AST 17 15 - 41 U/L   ALT 16 (L) 17 - 63 U/L   Alkaline Phosphatase 85 38 - 126 U/L   Total Bilirubin 0.5 0.3 - 1.2 mg/dL   GFR calc non Af Amer 39 (L) >60 mL/min   GFR calc Af Amer 45 (L) >60 mL/min    Comment: (NOTE) The eGFR has been calculated using the CKD EPI equation. This calculation has not been validated in all clinical situations. eGFR's persistently <60 mL/min signify possible Chronic Kidney Disease.    Anion gap 9 5 - 15    Comment: Performed at [facility], [address] [place], [place] [address]  Glucose, capillary     Status: Abnormal   Collection Time: 11/06/17 11:20 AM  Result Value Ref Range   Glucose-Capillary 232 (H) 65 - 99 mg/dL    Blood Alcohol level:  Lab Results  Component Value Date   ETH <10 91/47/8295    Metabolic Disorder Labs: Lab Results  Component Value Date   HGBA1C 7.8 (H) 11/05/2017   MPG 177.16 11/05/2017   No results found for: PROLACTIN Lab Results  Component Value Date   CHOL 215 (H) 11/05/2017   TRIG 387 (H) 11/05/2017   HDL 32 (L) 11/05/2017   CHOLHDL 6.7 11/05/2017   VLDL 77 (H) 11/05/2017   LDLCALC 106 (H) 11/05/2017    Physical Findings: AIMS: Facial and Oral Movements Muscles of Facial Expression: None, normal Lips and Perioral Area: None, normal Jaw: None, normal Tongue: None, normal,Extremity Movements Upper (arms, wrists, hands, fingers): None, normal Lower (legs, knees, ankles, toes): None, normal, Trunk Movements Neck, shoulders, hips: None, normal, Overall  Severity Severity of abnormal movements (highest score from questions above): None, normal Incapacitation due to abnormal movements: None, normal Patient's awareness of abnormal movements (rate only patient's report): No Awareness, Dental Status Current problems with teeth  and/or dentures?: No Does patient usually wear dentures?: No  CIWA:  CIWA-Ar Total: 2 COWS:  COWS Total Score: 3  Musculoskeletal: Strength & Muscle Tone: within normal limits Gait & Station: normal Patient leans: N/A  Psychiatric Specialty Exam: Physical Exam  Nursing note and vitals reviewed.   Review of Systems  All other systems reviewed and are negative.   Blood pressure 92/66, pulse 72, temperature 97.9 F (36.6 C), temperature source Oral, resp. rate 18, height 5' 7"  (1.702 m), weight 95.7 kg (211 lb), SpO2 97 %.Body mass index is 33.05 kg/m.  General Appearance: Casual  Eye Contact:  Good  Speech:  Clear and Coherent  Volume:  Normal  Mood:  Euthymic  Affect:  Appropriate  Thought Process:  Coherent and Goal Directed  Orientation:  Full (Time, Place, and Person)  Thought Content:  Logical  Suicidal Thoughts:  No  Homicidal Thoughts:  No  Memory:  Immediate;   Good  Judgement:  Fair  Insight:  Fair  Psychomotor Activity:  Normal  Concentration:  Concentration: Fair  Recall:  [employer] of Knowledge:  Fair  Language:  Fair  Akathisia:  No      Assets:  Resilience  ADL's:  Intact  Cognition:  WNL  Sleep:  Number of Hours: 7.75     Treatment Plan Summary: 57 yo male admitted due to suicidal thoughts. Mood is improving and he is sleeping better. No psychotic symptoms.  Plan:  History of schizoaffective disorder -Continue Fanapt 12 mg BID -Continue Depakote 500 mg BID. Lvel 34, no indication to increase dose at that time as his mood is stable -Continue Remeron 30 mg daily -Continue Effexor XR 75 mg daily -Continue lower dose of Klonopin 0.5 mg TID. Discussed tapering off of this medications which he is open to  HIV -Pt reports consistent follow up with his outpatient provider and monitors CD4 count -Triumeq  DM -Glimepiride 4 mg daily -Metformin 1000 mg BID -Lantus 85 units daily  Cre still elevated, IN chart review, he does have history of renal  insufficient and glomerular infiltration. He will need PCP follow up with this.   Dispo -Pt will discharge home when stable. He will follow up with [facility] and [place], MD 11/06/2017, 12:58 PM

## 2017-11-06 NOTE — Tx Team (Addendum)
Interdisciplinary Treatment and Diagnostic Plan Update  11/06/2017 Time of Session: 1100 Danny Cook. MRN: [id number]  Principal Diagnosis: Schizoaffective disorder, depressive type ([place])  Secondary Diagnoses: Principal Problem:   Schizoaffective disorder, depressive type ([place])   Current Medications:  Current Facility-Administered Medications  Medication Dose Route Frequency Provider Last Rate Last Dose  . abacavir-dolutegravir-lamiVUDine (TRIUMEQ) 268-34-196 MG per tablet 1 tablet  1 tablet Oral Daily Clapacs, Madie Reno, MD   1 tablet at 11/06/17 [phone]  . acetaminophen (TYLENOL) tablet 650 mg  650 mg Oral Q6H PRN Clapacs, John T, MD      . alum & mag hydroxide-simeth (MAALOX/MYLANTA) 200-200-20 MG/5ML suspension 30 mL  30 mL Oral Q4H PRN Clapacs, John T, MD      . aspirin EC tablet 81 mg  81 mg Oral Daily Clapacs, Madie Reno, MD   81 mg at 11/06/17 0839  . clonazePAM (KLONOPIN) tablet 0.5 mg  0.5 mg Oral TID Marylin Crosby, MD   0.5 mg at 11/06/17 0839  . diphenhydrAMINE (BENADRYL) capsule 50 mg  50 mg Oral QHS PRN Marylin Crosby, MD   50 mg at 11/05/17 2124  . divalproex (DEPAKOTE) DR tablet 500 mg  500 mg Oral Q12H Clapacs, Madie Reno, MD   500 mg at 11/06/17 0839  . [START ON 11/07/2017] furosemide (LASIX) tablet 20 mg  20 mg Oral Daily McNew, Holly R, MD      . gabapentin (NEURONTIN) capsule 300 mg  300 mg Oral TID Clapacs, Madie Reno, MD   300 mg at 11/06/17 0839  . gemfibrozil (LOPID) tablet 600 mg  600 mg Oral BID AC Clapacs, Madie Reno, MD   600 mg at 11/06/17 0839  . glimepiride (AMARYL) tablet 4 mg  4 mg Oral Q breakfast Clapacs, John T, MD   4 mg at 11/06/17 0839  . hydrOXYzine (ATARAX/VISTARIL) tablet 50 mg  50 mg Oral TID PRN Clapacs, Madie Reno, MD   50 mg at 11/05/17 2124  . iloperidone (FANAPT) tablet 12 mg  12 mg Oral BID Clapacs, Madie Reno, MD   12 mg at 11/06/17 0837  . insulin aspart (novoLOG) injection 0-15 Units  0-15 Units Subcutaneous TID WC Clapacs, Madie Reno, MD   2 Units at 11/06/17  [phone]  . insulin glargine (LANTUS) injection 84 Units  84 Units Subcutaneous Redmond School, MD   84 Units at 11/06/17 [phone]  . lisinopril (PRINIVIL,ZESTRIL) tablet 20 mg  20 mg Oral Daily Clapacs, Madie Reno, MD   Stopped at 11/06/17 [phone]  . magnesium hydroxide (MILK OF MAGNESIA) suspension 30 mL  30 mL Oral Daily PRN Clapacs, John T, MD      . metFORMIN (GLUCOPHAGE) tablet 1,000 mg  1,000 mg Oral BID WC Clapacs, Madie Reno, MD   1,000 mg at 11/06/17 0814  . mirtazapine (REMERON) tablet 30 mg  30 mg Oral QHS Clapacs, Madie Reno, MD   30 mg at 11/05/17 2124  . pantoprazole (PROTONIX) EC tablet 40 mg  40 mg Oral Daily Clapacs, Madie Reno, MD   40 mg at 11/06/17 0839  . rosuvastatin (CRESTOR) tablet 20 mg  20 mg Oral q1800 Clapacs, Madie Reno, MD   20 mg at 11/05/17 1758  . venlafaxine XR (EFFEXOR-XR) 24 hr capsule 75 mg  75 mg Oral Q breakfast McNew, Tyson Babinski, MD   75 mg at 11/06/17 0847  . vitamin B-12 (CYANOCOBALAMIN) tablet 1,000 mcg  1,000 mcg Oral Daily Clapacs, Madie Reno, MD  1,000 mcg at 11/06/17 4174   PTA Medications: Medications Prior to Admission  Medication Sig Dispense Refill Last Dose  . Abacavir-Dolutegravir-Lamivud (TRIUMEQ) 600-50-300 MG TABS Take 1 tablet by mouth daily.   Taking  . amitriptyline (ELAVIL) 25 MG tablet Take 1 tablet by mouth at bedtime.   Taking  . amoxicillin-clavulanate (AUGMENTIN) 875-125 MG tablet Take 1 tablet by mouth every 12 (twelve) hours. 17 tablet 0 Taking  . aspirin 325 MG tablet Take 325 mg by mouth daily.   Taking  . clonazePAM (KLONOPIN) 2 MG tablet Take 2 mg by mouth 2 (two) times daily.   Taking  . cyanocobalamin 1000 MCG tablet Take 1,000 mcg by mouth daily.   Taking  . divalproex (DEPAKOTE ER) 250 MG 24 hr tablet Take 500 mg by mouth 2 (two) times daily.    Taking  . FANAPT 10 MG TABS Take 12 mg by mouth 2 (two) times daily.    Taking  . furosemide (LASIX) 20 MG tablet Take 10 mg by mouth daily.   Taking  . gabapentin (NEURONTIN) 300 MG capsule Take 300 mg by  mouth 3 (three) times daily.   Taking  . gemfibrozil (LOPID) 600 MG tablet Take 1 tablet by mouth 2 (two) times daily.   Taking  . Insulin Degludec 200 UNIT/ML SOPN Inject 20 Units into the skin daily.   Taking  . lisinopril (PRINIVIL,ZESTRIL) 20 MG tablet Take 20 mg by mouth daily.   Taking  . meclizine (ANTIVERT) 25 MG tablet Take 1 tablet (25 mg total) by mouth 3 (three) times daily as needed for dizziness or nausea. 30 tablet 1 Taking  . metFORMIN (GLUCOPHAGE) 1000 MG tablet Take 1,000 mg by mouth 2 (two) times daily with a meal.   Taking  . mirtazapine (REMERON) 30 MG tablet Take 30 mg by mouth at bedtime.   Taking  . omeprazole (PRILOSEC) 40 MG capsule Take 40 mg by mouth 2 (two) times daily.   Taking  . ondansetron (ZOFRAN ODT) 4 MG disintegrating tablet Take 1 tablet (4 mg total) by mouth every 8 (eight) hours as needed for nausea or vomiting. 20 tablet 0 Taking  . promethazine (PHENERGAN) 6.25 MG/5ML syrup Take 6.25 mg by mouth every 6 (six) hours as needed for nausea or vomiting.   Taking  . rosuvastatin (CRESTOR) 20 MG tablet Take 20 mg by mouth daily.   Taking  . sucralfate (CARAFATE) 1 g tablet Take 1 g by mouth 2 (two) times daily.   Taking  . triamcinolone cream (KENALOG) 0.1 % Apply 1 application topically 2 (two) times daily as needed. 30 g 0 Taking    Patient Stressors: Financial difficulties Health problems Occupational concerns  Patient Strengths: Average or above average intelligence Capable of independent living Communication skills Motivation for treatment/growth  Treatment Modalities: Medication Management, Group therapy, Case management,  1 to 1 session with clinician, Psychoeducation, Recreational therapy.   Physician Treatment Plan for Primary Diagnosis: Schizoaffective disorder, depressive type ([place]) Long Term Goal(s): Improvement in symptoms so as ready for discharge   Short Term Goals: Ability to disclose and discuss suicidal ideas  Medication  Management: Evaluate patient's response, side effects, and tolerance of medication regimen.  Therapeutic Interventions: 1 to 1 sessions, Unit Group sessions and Medication administration.  Evaluation of Outcomes: Progressing  Physician Treatment Plan for Secondary Diagnosis: Principal Problem:   Schizoaffective disorder, depressive type ([place])  Long Term Goal(s): Improvement in symptoms so as ready for discharge   Short Term Goals: Ability  to disclose and discuss suicidal ideas     Medication Management: Evaluate patient's response, side effects, and tolerance of medication regimen.  Therapeutic Interventions: 1 to 1 sessions, Unit Group sessions and Medication administration.  Evaluation of Outcomes: Progressing   RN Treatment Plan for Primary Diagnosis: Schizoaffective disorder, depressive type ([place]) Long Term Goal(s): Knowledge of disease and therapeutic regimen to maintain health will improve  Short Term Goals: Ability to participate in decision making will improve, Ability to identify and develop effective coping behaviors will improve and Compliance with prescribed medications will improve  Medication Management: RN will administer medications as ordered by provider, will assess and evaluate patient's response and provide education to patient for prescribed medication. RN will report any adverse and/or side effects to prescribing provider.  Therapeutic Interventions: 1 on 1 counseling sessions, Psychoeducation, Medication administration, Evaluate responses to treatment, Monitor vital signs and CBGs as ordered, Perform/monitor CIWA, COWS, AIMS and Fall Risk screenings as ordered, Perform wound care treatments as ordered.  Evaluation of Outcomes: Progressing   LCSW Treatment Plan for Primary Diagnosis: Schizoaffective disorder, depressive type ([place]) Long Term Goal(s): Safe transition to appropriate next level of care at discharge, Engage patient in therapeutic group addressing  interpersonal concerns.  Short Term Goals: Engage patient in aftercare planning with referrals and resources, Increase emotional regulation and Increase skills for wellness and recovery  Therapeutic Interventions: Assess for all discharge needs, 1 to 1 time with Social worker, Explore available resources and support systems, Assess for adequacy in community support network, Educate family and significant other(s) on suicide prevention, Complete Psychosocial Assessment, Interpersonal group therapy.  Evaluation of Outcomes: Progressing   Progress in Treatment: Attending groups: Yes. Participating in groups: Yes. Taking medication as prescribed: Yes. Toleration medication: Yes. Family/Significant other contact made: Yes, individual(s) contacted:  pt's brother. Patient understands diagnosis: Yes. Discussing patient identified problems/goals with staff: Yes. Medical problems stabilized or resolved: Yes. Denies suicidal/homicidal ideation: Yes. Issues/concerns per patient self-inventory: No. Other: None at this time.   New problem(s) identified: No, Describe:  none at this time.   New Short Term/Long Term Goal(s): Pt reported his goal for tx is to, "do activities, relax, and not get overwhelmed."   Discharge Plan or Barriers: Pt will discharge home and will continue tx with [facility], and continue peer support services with [facility].   Reason for Continuation of Hospitalization: Depression Medication stabilization  Estimated Length of Stay: 3 days  Recreational Therapy: Patient Stressors: Living alone    Patient Goal: Patient will identify 3 positive coping skills to decrease depressive symptoms within 5 recreation therapy group sessions  Attendees: Patient: Danny Cook  11/06/2017 11:13 AM  Physician: Dr. Wonda Olds, MD 11/06/2017 11:13 AM  Nursing: Jerry Caras, RN  11/06/2017 11:13 AM  RN Care Manager: 11/06/2017 11:13 AM  Social Worker: Alden Hipp, LCSW 11/06/2017 11:13 AM  Recreational  Therapist: Roanna Epley, CTRS-LRT 11/06/2017 11:13 AM  Other: Derrek Gu, LCSW 11/06/2017 11:13 AM  Other: Darin Engels, [place] 11/06/2017 11:13 AM  Other: 11/06/2017 11:13 AM     Scribe for Treatment Team: Alden Hipp, LCSW 11/06/2017 11:21 AM

## 2017-11-06 NOTE — Progress Notes (Signed)
Recreation Therapy Notes  Date: 11/06/2017  Time: 9:30 am  Location: Craft Room  Behavioral response: Appropriate   Intervention Topic: Leisure  Discussion/Intervention:  Group content today was focused on leisure. The group defined what leisure is and some positive leisure activities they participate in. Individuals identified the difference between good and bad leisure. Participants expressed how they feel after participating in the leisure of their choice. The group discussed how they go about picking a leisure activity and if others are involved in their leisure activities. The patient stated how many leisure activities they too choose from and reasons why it is important to have leisure time. Individuals participated in the intervention "Leisure Jeopardy" where they had a chance to identify new leisure activities as well as benefits of leisure. Clinical Observations/Feedback:  Patient came to group and stated he likes to play bingo when he has free time. Individual stated he participates in leisure activities because the relax his mind. He was social with peers and staff while participating in the intervention. Danny Cook LRT/CTRS         Lorie Cleckley 11/06/2017 11:56 AM

## 2017-11-06 NOTE — Progress Notes (Signed)
Data: Patient is alert and oriented. Patient Denies SI/HI/AVH at this time. Patient is appropriate and cooperative. Patient has no complaints and a pain rating of 0/10. Patient reports good sleep, appetite is good. Patient rates depression 1/10 , Feelings of hopelessness 1/10 and Anxiety 4/10. Patients goal for today is staying positive.   Action:  Q x 15 minute observation checks were completed for safety. Patient was provided with education on medications. Patient was offered support and encouragement. Patient was given scheduled medications. Patient  was encourage to attend groups, participate in unit activities and continue with plan of care.    Response: Patient has no complaints at this time. Patient is receptive to treatment and safety maintained on unit.

## 2017-11-06 NOTE — Progress Notes (Signed)
[name] contacted pt's Peer Support Specialist, Hardie Pulley at [phone], to alert her of pt's projected discharge for Monday, 11/09/17. Lauren did not answer, so [name] left a voicemail asking her to call back ASAP. [name] will reattempt contact as needed.   Alden Hipp, MSW, LCSW Clinical Social Worker 11/06/2017 10:01 AM

## 2017-11-06 NOTE — [facility] Group Notes (Signed)

## 2017-11-06 NOTE — Plan of Care (Signed)
  Problem: Self-Concept: Goal: Ability to disclose and discuss suicidal ideas will improve Outcome: Progressing Goal: Will verbalize positive feelings about self Outcome: Progressing   Problem: Coping: Goal: Will verbalize feelings Outcome: Progressing   Problem: Education: Goal: Knowledge of General Education information will improve Outcome: Progressing   Problem: Safety: Goal: Ability to remain free from injury will improve Outcome: Progressing

## 2017-11-07 DIAGNOSIS — Z87448 Personal history of other diseases of urinary system: Secondary | ICD-10-CM

## 2017-11-07 DIAGNOSIS — Z21 Asymptomatic human immunodeficiency virus [HIV] infection status: Secondary | ICD-10-CM

## 2017-11-07 DIAGNOSIS — E119 Type 2 diabetes mellitus without complications: Secondary | ICD-10-CM

## 2017-11-07 DIAGNOSIS — F251 Schizoaffective disorder, depressive type: Secondary | ICD-10-CM

## 2017-11-07 LAB — GLUCOSE, CAPILLARY
GLUCOSE-CAPILLARY: 155 mg/dL — AB (ref 65–99)
GLUCOSE-CAPILLARY: 80 mg/dL (ref 65–99)
GLUCOSE-CAPILLARY: 73 mg/dL (ref 65–99)
Glucose-Capillary: 119 mg/dL — ABNORMAL HIGH (ref 65–99)
Glucose-Capillary: 54 mg/dL — ABNORMAL LOW (ref 65–99)
Glucose-Capillary: 64 mg/dL — ABNORMAL LOW (ref 65–99)
Glucose-Capillary: 101 mg/dL — ABNORMAL HIGH (ref 65–99)

## 2017-11-07 NOTE — Progress Notes (Signed)
Hypoglycemia Interventions   2101: Pt. Blood sugar 64 with no signs or symptoms of hypoglycemia. Pt. Given juice and snacks for snack time per hypoglycemia protocol and per unit activities. Pt. Assessed and Alert and oriented times 4. No signs or symptoms of distress.   2108: On call MD notified of pt. Low blood sugar POC testing. No new orders given. Continue to monitor and follow hypoglycemia protocol. Pt. Assessed and Alert and oriented times 4. No signs or symptoms of distress.    2116 & 2121: Pt. Blood sugar POC testing performed with results still low 54. Pt. Given additional juice and snacks per protocol. Pt. Assessed and Alert and oriented times 4. No signs or symptoms of distress.    2132: Night shift supervisor notified and was asked by this writer for hypoglycemia template for hypoglycemia protocol interventions documentation. Did not receive. Pt. Assessed and Alert and oriented times 4. No signs or symptoms of distress.   2137: Pt. Blood sugar POC testing performed with results WNL. Protocol followed to give additional juice and carbohydrates.Pt. Assessed and Alert and oriented times 4. No signs or symptoms of distress.   2153: Pt. Blood sugar POC testing performed with results WNL. Protocol followed. Pt. Assessed and Alert and oriented times 4. No signs or symptoms of distress.   Will continue to monitor blood sugars per MD orders and protocols for safety.

## 2017-11-07 NOTE — [facility] Group Notes (Signed)
[facility] Group Notes:  (Nursing/MHT/Case Management/Adjunct)  Date:  11/07/2017  Time:  9:54 PM  Type of Therapy:  Group Therapy  Participation Level:  Active  Participation Quality:  Appropriate  Affect:  Appropriate  Cognitive:  Alert  Insight:  Good  Engagement in Group:  Engaged  Modes of Intervention:  Support  Summary of Progress/Problems:  Danny Cook 11/07/2017, 9:54 PM

## 2017-11-07 NOTE — Plan of Care (Signed)
Pt. Denies Si/HI. Pt. Verbally contracts for safety. Pt. Reports he can remain safe while on the unit. Pt. Verbalizes understanding of educations provided. Pt. Given in-debt education on blood sugar management, Dm, utilizing front wheel walker, and to call this Probation officer or staff at anytime for assistance with ambulation or other needs.    Problem: Self-Concept: Goal: Ability to disclose and discuss suicidal ideas will improve Outcome: Progressing   Problem: Education: Goal: Knowledge of General Education information will improve Outcome: Progressing   Problem: Pain Managment: Goal: General experience of comfort will improve Outcome: Progressing   Problem: Safety: Goal: Ability to remain free from injury will improve Outcome: Progressing

## 2017-11-07 NOTE — Progress Notes (Signed)
D: Patient denies SI/HI/AVH. Patient verbally contracts for safety. Patient is calm, cooperative and pleasant. Patient is seen in milieu interacting with peers. Patient has no complaints at this time.  A: Patient was assessed by this nurse. Patient was oriented to unit. Patient's safety was maintained on unit. Q x 15 minute observation checks were completed for safety. Patient care plan was reviewed. Patient was offered support and encouragement. Patient was encourage to attend groups, participate in unit activities and continue with plan of care.   R: Patient has no complaints of pain at this time. Patient is receptive to treatment and safety maintained on unit. Patient is prepared for discharge later this week.

## 2017-11-07 NOTE — Progress Notes (Signed)
D:Pt denies SI/HI/AVH. Pt is pleasant and cooperative. Pt. has no Complaints, but does report anxiety 4/10.  Patient Interaction engaging and appropriate. Pt. Blood sugar monitored for safety per MD orders. Pt. Attending to hygiene well.   A: Q x 15 minute observation checks were completed for safety. Patient was provided with education. Patient was given scheduled medications. Patient  was encourage to attend groups, participate in unit activities and continue with plan of care. Pt. Chart and care plans reviewed. Pt. Given support and encouragement.    R:Patient is complaint with medication and unit procedures.             Precautionary checks every 15 minutes for safety maintained, room free of safety hazards, patient sustains no injury or falls during this shift.

## 2017-11-07 NOTE — Plan of Care (Signed)
Pt. Verbalizes understanding of education provided. Pt. Denies SI/Hi. Pt. Reports he can remain safe while on the unit.    Problem: Self-Concept: Goal: Ability to disclose and discuss suicidal ideas will improve Outcome: Progressing   Problem: Education: Goal: Knowledge of General Education information will improve Outcome: Progressing

## 2017-11-07 NOTE — Progress Notes (Signed)
[facility] MD Progress Note  11/07/2017 2:02 PM Danny Cook.  MRN:  [id number] Subjective: Patient doing much better.  Endorses that he does not have any complaints or concerns at this point.  Feels her medications are doing okay.  Denies any suicidal thoughts or safety issues currently.  Principal Problem: Schizoaffective disorder, depressive type (Danny Cook) Diagnosis:   Patient Active Problem List   Diagnosis Date Noted  . Schizoaffective disorder, depressive type ([place]) [F25.1] 11/04/2017  . HIV positive ([place]) [Z21] 11/04/2017  . Orthostatic dizziness [R42] 07/31/2017  . Chronic diastolic heart failure ([place]) [I50.32] 07/29/2017  . HTN (hypertension) [I10] 07/29/2017  . Diabetes ([place]) [E11.9] 07/29/2017  . Schizophrenia ([place]) [F20.9] 07/29/2017   Total Time spent with patient: 20 minutes  Past Psychiatric History: See H&P  Past Medical History:  Past Medical History:  Diagnosis Date  . Anemia   . Anxiety   . Arthritis   . CHF (congestive heart failure) ([place])   . Chronic kidney disease    Renal Insufficiency Syndrome; Glomerulosclerosis 2013  . Complication of anesthesia   . Depressed   . Diabetes mellitus without complication ([place])   . High cholesterol   . HIV (human immunodeficiency virus infection) ([place])   . Hypertension   . 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 Paranoid disorder ([place])   . Schizophrenia, paranoid ([place])   . Sleep apnea     Past Surgical History:  Procedure Laterality Date  . COLONOSCOPY WITH PROPOFOL N/A 09/03/2015   Procedure: COLONOSCOPY WITH PROPOFOL;  Surgeon: Lollie Sails, MD;  Location: [facility] ENDOSCOPY;  Service: Endoscopy;  Laterality: N/A;  . DG TEETH FULL    . ESOPHAGOGASTRODUODENOSCOPY (EGD) WITH PROPOFOL N/A 07/01/2016   Procedure: ESOPHAGOGASTRODUODENOSCOPY (EGD) WITH PROPOFOL;  Surgeon: Lollie Sails, MD;  [facility];  Service: Endoscopy;  Laterality: N/A;   Family History:  Family History  Problem Relation Age of Onset  . Heart  attack Mother   . Diabetes Mellitus II Mother   . Mental illness Mother   . CAD Mother   . Heart attack Father   . CAD Father   . Hypertension Father    Family Psychiatric  History: See H&P Social History:  Social History   Substance and Sexual Activity  Alcohol Use No     Social History   Substance and Sexual Activity  Drug Use No    Social History   Socioeconomic History  . Marital status: Single    Spouse name: Not on file  . Number of children: Not on file  . Years of education: 75  . Highest education level: GED or equivalent  Occupational History  . Not on file  Social Needs  . Financial resource strain: Not hard at all  . Food insecurity:    Worry: Sometimes true    Inability: Never true  . Transportation needs:    Medical: No    Non-medical: No  Tobacco Use  . Smoking status: Never Smoker  . Smokeless tobacco: Never Used  Substance and Sexual Activity  . Alcohol use: No  . Drug use: No  . Sexual activity: Never  Lifestyle  . Physical activity:    Days per week: 0 days    Minutes per session: 0 min  . Stress: To some extent  Relationships  . Social connections:    Talks on phone: More than three times a week    Gets together: Three times a week    Attends religious service: Never  Active member of club or organization: No    Attends meetings of clubs or organizations: Never    Relationship status: Never married  Other Topics Concern  . Not on file  Social History Narrative  . Not on file   Additional Social History:                         Sleep: Good  Appetite:  Good  Current Medications: Current Facility-Administered Medications  Medication Dose Route Frequency Provider Last Rate Last Dose  . abacavir-dolutegravir-lamiVUDine (TRIUMEQ) [id number] MG per tablet 1 tablet  1 tablet Oral Daily Clapacs, Madie Reno, MD   1 tablet at 11/07/17 0847  . acetaminophen (TYLENOL) tablet 650 mg  650 mg Oral Q6H PRN Clapacs, John T, MD       . alum & mag hydroxide-simeth (MAALOX/MYLANTA) 200-200-20 MG/5ML suspension 30 mL  30 mL Oral Q4H PRN Clapacs, John T, MD      . aspirin EC tablet 81 mg  81 mg Oral Daily Clapacs, Madie Reno, MD   81 mg at 11/07/17 0846  . clonazePAM (KLONOPIN) tablet 0.5 mg  0.5 mg Oral TID Marylin Crosby, MD   0.5 mg at 11/07/17 1112  . diphenhydrAMINE (BENADRYL) capsule 50 mg  50 mg Oral QHS PRN Marylin Crosby, MD   50 mg at 11/06/17 2102  . divalproex (DEPAKOTE) DR tablet 500 mg  500 mg Oral Q12H Clapacs, John T, MD   500 mg at 11/07/17 0847  . furosemide (LASIX) tablet 20 mg  20 mg Oral Daily McNew, Tyson Babinski, MD   20 mg at 11/07/17 0846  . gabapentin (NEURONTIN) capsule 300 mg  300 mg Oral TID Clapacs, Madie Reno, MD   300 mg at 11/07/17 1112  . gemfibrozil (LOPID) tablet 600 mg  600 mg Oral BID AC Clapacs, John T, MD   600 mg at 11/07/17 0848  . glimepiride (AMARYL) tablet 4 mg  4 mg Oral Q breakfast Clapacs, Madie Reno, MD   4 mg at 11/07/17 0847  . hydrOXYzine (ATARAX/VISTARIL) tablet 50 mg  50 mg Oral TID PRN Clapacs, Madie Reno, MD   50 mg at 11/05/17 2124  . iloperidone (FANAPT) tablet 12 mg  12 mg Oral BID Clapacs, Madie Reno, MD   12 mg at 11/07/17 0845  . insulin aspart (novoLOG) injection 0-15 Units  0-15 Units Subcutaneous TID WC Clapacs, Madie Reno, MD   3 Units at 11/07/17 1112  . insulin glargine (LANTUS) injection 84 Units  84 Units Subcutaneous Redmond School, MD   84 Units at 11/07/17 0849  . lisinopril (PRINIVIL,ZESTRIL) tablet 20 mg  20 mg Oral Daily Clapacs, Madie Reno, MD   20 mg at 11/07/17 0846  . magnesium hydroxide (MILK OF MAGNESIA) suspension 30 mL  30 mL Oral Daily PRN Clapacs, John T, MD      . metFORMIN (GLUCOPHAGE) tablet 1,000 mg  1,000 mg Oral BID WC Clapacs, Madie Reno, MD   1,000 mg at 11/07/17 0845  . mirtazapine (REMERON) tablet 30 mg  30 mg Oral QHS Clapacs, Madie Reno, MD   30 mg at 11/06/17 2102  . pantoprazole (PROTONIX) EC tablet 40 mg  40 mg Oral Daily Clapacs, Madie Reno, MD   40 mg at 11/07/17 0847   . rosuvastatin (CRESTOR) tablet 20 mg  20 mg Oral q1800 Clapacs, Madie Reno, MD   20 mg at 11/06/17 1714  . venlafaxine XR (EFFEXOR-XR) 24 hr  capsule 75 mg  75 mg Oral Q breakfast McNew, Tyson Babinski, MD   75 mg at 11/07/17 0846  . vitamin B-12 (CYANOCOBALAMIN) tablet 1,000 mcg  1,000 mcg Oral Daily Clapacs, Madie Reno, MD   1,000 mcg at 11/07/17 0845    Lab Results:  Results for orders placed or performed during the hospital encounter of 11/04/17 (from the past 48 hour(s))  Glucose, capillary     Status: Abnormal   Collection Time: 11/05/17  4:23 PM  Result Value Ref Range   Glucose-Capillary 119 (H) 65 - 99 mg/dL   Comment 1 Notify RN   Glucose, capillary     Status: Abnormal   Collection Time: 11/06/17  7:15 AM  Result Value Ref Range   Glucose-Capillary 138 (H) 65 - 99 mg/dL  Valproic acid level     Status: Abnormal   Collection Time: 11/06/17  7:21 AM  Result Value Ref Range   Valproic Acid Lvl 34 (L) 50.0 - 100.0 ug/mL    Comment: Performed at [facility], [address] [place], [place] [address]  Comprehensive metabolic panel     Status: Abnormal   Collection Time: 11/06/17  7:21 AM  Result Value Ref Range   Sodium 137 135 - 145 mmol/L   Potassium 4.6 3.5 - 5.1 mmol/L   Chloride 101 101 - 111 mmol/L   CO2 27 22 - 32 mmol/L   Glucose, Bld 137 (H) 65 - 99 mg/dL   BUN 40 (H) 6 - 20 mg/dL   Creatinine, Ser 1.82 (H) 0.61 - 1.24 mg/dL   Calcium 10.0 8.9 - 10.3 mg/dL   Total Protein 8.0 6.5 - 8.1 g/dL   Albumin 4.0 3.5 - 5.0 g/dL   AST 17 15 - 41 U/L   ALT 16 (L) 17 - 63 U/L   Alkaline Phosphatase 85 38 - 126 U/L   Total Bilirubin 0.5 0.3 - 1.2 mg/dL   GFR calc non Af Amer 39 (L) >60 mL/min   GFR calc Af Amer 45 (L) >60 mL/min    Comment: (NOTE) The eGFR has been calculated using the CKD EPI equation. This calculation has not been validated in all clinical situations. eGFR's persistently <60 mL/min signify possible Chronic Kidney Disease.    Anion gap 9 5 - 15     Comment: Performed at [facility], [address] [place], [place] [address]  Glucose, capillary     Status: Abnormal   Collection Time: 11/06/17 11:20 AM  Result Value Ref Range   Glucose-Capillary 232 (H) 65 - 99 mg/dL  Glucose, capillary     Status: None   Collection Time: 11/06/17  4:21 PM  Result Value Ref Range   Glucose-Capillary 96 65 - 99 mg/dL  Glucose, capillary     Status: Abnormal   Collection Time: 11/06/17  9:00 PM  Result Value Ref Range   Glucose-Capillary 149 (H) 65 - 99 mg/dL  Glucose, capillary     Status: Abnormal   Collection Time: 11/07/17  7:05 AM  Result Value Ref Range   Glucose-Capillary 119 (H) 65 - 99 mg/dL  Glucose, capillary     Status: Abnormal   Collection Time: 11/07/17 11:12 AM  Result Value Ref Range   Glucose-Capillary 155 (H) 65 - 99 mg/dL    Blood Alcohol level:  Lab Results  Component Value Date   ETH <10 78/67/6720    Metabolic Disorder Labs: Lab Results  Component Value Date   HGBA1C 7.8 (H) 11/05/2017  MPG 177.16 11/05/2017   No results found for: PROLACTIN Lab Results  Component Value Date   CHOL 215 (H) 11/05/2017   TRIG 387 (H) 11/05/2017   HDL 32 (L) 11/05/2017   CHOLHDL 6.7 11/05/2017   VLDL 77 (H) 11/05/2017   LDLCALC 106 (H) 11/05/2017    Physical Findings: AIMS: Facial and Oral Movements Muscles of Facial Expression: None, normal Lips and Perioral Area: None, normal Jaw: None, normal Tongue: None, normal,Extremity Movements Upper (arms, wrists, hands, fingers): None, normal Lower (legs, knees, ankles, toes): None, normal, Trunk Movements Neck, shoulders, hips: None, normal, Overall Severity Severity of abnormal movements (highest score from questions above): None, normal Incapacitation due to abnormal movements: None, normal Patient's awareness of abnormal movements (rate only patient's report): No Awareness, Dental Status Current problems with teeth and/or dentures?: No Does patient usually  wear dentures?: No  CIWA:  CIWA-Ar Total: 2 COWS:  COWS Total Score: 3  Musculoskeletal: Strength & Muscle Tone: within normal limits Gait & Station: normal Patient leans: N/A  Psychiatric Specialty Exam: Physical Exam  Nursing note and vitals reviewed.   Review of Systems  All other systems reviewed and are negative.   Blood pressure 127/78, pulse 89, temperature 98.4 F (36.9 C), temperature source Oral, resp. rate 18, height _0  (1.702 m), weight 211 lb (95.7 kg), SpO2 99 %.Body mass index is 33.05 kg/m.  General Appearance: Casual  Eye Contact:  Good  Speech:  Clear and Coherent  Volume:  Normal  Mood:  Euthymic  Affect:  Appropriate  Thought Process:  Coherent and Goal Directed  Orientation:  Full (Time, Place, and Person)  Thought Content:  Logical  Suicidal Thoughts:  No  Homicidal Thoughts:  No  Memory:  Immediate;   Good  Judgement:  Fair  Insight:  Fair  Psychomotor Activity:  Normal  Concentration:  Concentration: Fair  Recall:  [employer] of Knowledge:  Fair  Language:  Fair  Akathisia:  No      Assets:  Resilience  ADL's:  Intact  Cognition:  WNL  Sleep:  Number of Hours: 4.45     Treatment Plan Summary: 57 yo male admitted due to suicidal thoughts. Mood is improving and he is sleeping better. No psychotic symptoms.  Plan:  History of schizoaffective disorder -Continue Fanapt 12 mg BID -Continue Depakote 500 mg BID. Lvel 34, no indication to increase dose at that time as his mood is stable -Continue Remeron 30 mg daily -Continue Effexor XR 75 mg daily -Continue lower dose of Klonopin 0.5 mg TID. Discussed tapering off of this medications which he is open to  HIV -Pt reports consistent follow up with his outpatient provider and monitors CD4 count -Triumeq  DM -Glimepiride 4 mg daily -Metformin 1000 mg BID -Lantus 85 units daily  Cre still elevated, IN chart review, he does have history of renal insufficient and glomerular infiltration.  He will need PCP follow up with this.   Dispo -Pt will discharge home when stable. He will follow up with [facility] and [facility] Ramond Dial, MD 11/07/2017, 2:02 PM

## 2017-11-07 NOTE — [facility] Group Notes (Addendum)
LCSW Group Therapy Note  11/07/2017 1:15pm  Type of Therapy and Topic:  Group Therapy:  Cognitive Distortions  Participation Level:  Active   Description of Group:    Patients in this group will be introduced to the topic of cognitive distortions.  Patients will identify and describe cognitive distortions, describe the feelings these distortions create for them.  Patients will identify one or more situations in their personal life where they have cognitively distorted thinking and will verbalize challenging this cognitive distortion through positive thinking skills.  Patients will practice the skill of using positive affirmations to challenge cognitive distortions using affirmation cards.    Therapeutic Goals:  1. Patient will identify two or more cognitive distortions they have used 2. Patient will identify one or more emotions that stem from use of a cognitive distortion 3. Patient will demonstrate use of a positive affirmation to counter a cognitive distortion through discussion and/or role play. 4. Patient will describe one way cognitive distortions can be detrimental to wellness   Summary of Patient Progress: Patient scored his mood at a 10 (10 best.) Patient stated, "I am sleeping better." Patient was able to identify and describe cognitive distortions. He described the feelings these distortions create for him. He shared that the most used unhelpful thinking style he uses is personalization. Patient identified situations in his personal life where he has cognitively distorted thinking and was able to challenge these cognitive distortions through positive thinking skills.  Patients practiced the skill of using positive affirmations to challenge cognitive distortions using affirmation cards. Patient actively engaged in group discussion.       Therapeutic Modalities:   Cognitive Behavioral Therapy Motivational Interviewing   Charod Slawinski  CUEBAS-COLON, LCSW 11/07/2017 12:15 PM

## 2017-11-08 DIAGNOSIS — Z21 Asymptomatic human immunodeficiency virus [HIV] infection status: Secondary | ICD-10-CM

## 2017-11-08 DIAGNOSIS — Z87448 Personal history of other diseases of urinary system: Secondary | ICD-10-CM

## 2017-11-08 DIAGNOSIS — E119 Type 2 diabetes mellitus without complications: Secondary | ICD-10-CM

## 2017-11-08 DIAGNOSIS — F251 Schizoaffective disorder, depressive type: Secondary | ICD-10-CM

## 2017-11-08 LAB — GLUCOSE, CAPILLARY
GLUCOSE-CAPILLARY: 129 mg/dL — AB (ref 65–99)
Glucose-Capillary: 159 mg/dL — ABNORMAL HIGH (ref 65–99)
Glucose-Capillary: 169 mg/dL — ABNORMAL HIGH (ref 65–99)
Glucose-Capillary: 157 mg/dL — ABNORMAL HIGH (ref 65–99)

## 2017-11-08 NOTE — Plan of Care (Signed)
Pt calm and cooperative in dayroom interacting with most of the evening. Pt compliant with medication. Pt voice no complaints of pain. Pt denies SI/HI. Pt stated he experiences paranoia off and on throughout the day. Pt is receptive to treatment and safety maintained on unit. Pt gets around well with the front wheel walker. Will continue to monitor.

## 2017-11-08 NOTE — Progress Notes (Signed)
D:Pt denies SI/HI/AVH. Pt is pleasant and cooperative. Pt. has no Complaints.  Patient Interaction is cheerful and appropriate. Pt. This evening had an incident of hypoglycemia with no s/s of low blood sugar. MD and night shift supervisor updated on pt. Low blood sugar as well as hypoglycemia protocol followed per policy. Pt. Blood sugars monitored this shift for safety per MD orders. Pt. Reports no depression or anxiety.   A: Q x 15 minute observation checks were completed for safety. Patient was provided with education. Pt. Given extensive education on diabetes and safe mobility with front wheel walker and when to request the nurse. Patient was given scheduled medications. Patient  was encourage to attend groups, participate in unit activities and continue with plan of care. Pt. Plans of care reviewed as well as chart. Pt. Given support and encouragement.  R:Patient is complaint with medication and unit procedures. Pt. Attends snacks and unit activities, socializing with peers cheerfully. Pt. Is eager for D/C.              Precautionary checks every 15 minutes for safety maintained, room free of safety hazards, patient sustains no injury or falls during this shift.

## 2017-11-08 NOTE — BHH Group Notes (Signed)
LCSW Group Therapy Note 11/08/2017 1:15pm  Type of Therapy and Topic: Group Therapy: Feelings Around Returning Home & Establishing a Supportive Framework and Supporting Oneself When Supports Not Available  Participation Level: Active  Description of Group:  Patients first processed thoughts and feelings about upcoming discharge. These included fears of upcoming changes, lack of change, new living environments, judgements and expectations from others and overall stigma of mental health issues. The group then discussed the definition of a supportive framework, what that looks and feels like, and how do to discern it from an unhealthy non-supportive network. The group identified different types of supports as well as what to do when your family/friends are less than helpful or unavailable  Therapeutic Goals  1. Patient will identify one healthy supportive network that they can use at discharge. 2. Patient will identify one factor of a supportive framework and how to tell it from an unhealthy network. 3. Patient able to identify one coping skill to use when they do not have positive supports from others. 4. Patient will demonstrate ability to communicate their needs through discussion and/or role plays.  Summary of Patient Progress:  Patient reported he feels "okay." Pt engaged during group session. As patients processed their anxiety about discharge and described healthy supports patient shared that he is ready to go home, he stated "my mind is clear and rational." Patients identified at least one self-care tool they were willing to use after discharge, play table games.   Therapeutic Modalities Cognitive Behavioral Therapy Motivational Interviewing   Katisha Shimizu  CUEBAS-COLON, LCSW 11/08/2017 9:59 AM

## 2017-11-08 NOTE — Plan of Care (Signed)
Patient has the ability to make informed decisions about his treatment as well as the ability to cope and verbalize his feelings. Patient denies SI/HI/AVH at this time and has been attending unit groups without any issues. Patient also denies any signs/symptoms of depression and anxiety. Patient has been observed interacting well with staff and other members on the unit and he has been working towards maintaining his clinical measurements within normal limits. Patient has the ability to manage any of his health-related needs and has been free from any health-related complications thus far. Patient has remained free from infection and his diagnostic test results have been improving. Patient has used fair eye contact when communicating with this Probation officer. Patient has also maintained adequate nutrition while on the unit and his overall level of comfort has improved. Patient has remained free from injury, he has been functioning at an adequate level, and his risk for impaired skin integrity has decreased. Patient remains safe on the unit at this time.   Problem: Education: Goal: Ability to make informed decisions regarding treatment will improve Outcome: Progressing   Problem: Self-Concept: Goal: Ability to disclose and discuss suicidal ideas will improve Outcome: Progressing Goal: Will verbalize positive feelings about self Outcome: Progressing   Problem: Coping: Goal: Coping ability will improve Outcome: Progressing Goal: Will verbalize feelings Outcome: Progressing   Problem: Coping: Goal: Ability to identify and develop effective coping behavior will improve Outcome: Progressing Goal: Ability to interact with others will improve Outcome: Progressing Goal: Demonstration of participation in decision-making regarding own care will improve Outcome: Progressing Goal: Ability to use eye contact when communicating with others will improve Outcome: Progressing   Problem: Education: Goal: Knowledge  of General Education information will improve Outcome: Progressing   Problem: Health Behavior/Discharge Planning: Goal: Ability to manage health-related needs will improve Outcome: Progressing   Problem: Clinical Measurements: Goal: Ability to maintain clinical measurements within normal limits will improve Outcome: Progressing Goal: Will remain free from infection Outcome: Progressing Goal: Diagnostic test results will improve Outcome: Progressing Goal: Respiratory complications will improve Outcome: Progressing Goal: Cardiovascular complication will be avoided Outcome: Progressing   Problem: Activity: Goal: Risk for activity intolerance will decrease Outcome: Progressing   Problem: Nutrition: Goal: Adequate nutrition will be maintained Outcome: Progressing   Problem: Coping: Goal: Level of anxiety will decrease Outcome: Progressing   Problem: Elimination: Goal: Will not experience complications related to bowel motility Outcome: Progressing Goal: Will not experience complications related to urinary retention Outcome: Progressing   Problem: Pain Managment: Goal: General experience of comfort will improve Outcome: Progressing   Problem: Safety: Goal: Ability to remain free from injury will improve Outcome: Progressing   Problem: Skin Integrity: Goal: Risk for impaired skin integrity will decrease Outcome: Progressing   Problem: Spiritual Needs Goal: Ability to function at adequate level Outcome: Progressing

## 2017-11-08 NOTE — Progress Notes (Signed)
D- Patient alert and oriented. Patient presents in a pleasant mood on assessment stating that he slept "real good" last night. Patient states to this writer that he has osteoarthritis and "I can't stand for too long". Patient denies SI, HI, AVH, and pain at this time. Patient also denies any signs/symptoms of depression/anxiety at this time. Patient's goal for today is "not to worry about anything", in which patient will "stay positive" in order to do so.  A- Scheduled medications administered to patient, per MD orders. Support and encouragement provided.  Routine safety checks conducted every 15 minutes.  Patient informed to notify staff with problems or concerns.  R- No adverse drug reactions noted. Patient contracts for safety at this time. Patient compliant with medications and treatment plan. Patient receptive, calm, and cooperative. Patient interacts well with others on the unit.  Patient remains safe at this time.

## 2017-11-08 NOTE — BHH Group Notes (Signed)
[facility] Group Notes:  (Nursing/MHT/Case Management/Adjunct)  Date:  11/08/2017  Time:  10:50 PM  Type of Therapy:  Group Therapy  Participation Level:  Active  Participation Quality:  Appropriate  Affect:  Appropriate  Cognitive:  Appropriate  Insight:  Good  Engagement in Group:  Engaged  Modes of Intervention:  Support  Summary of Progress/Problems:  Danny Cook 11/08/2017, 10:50 PM

## 2017-11-08 NOTE — Progress Notes (Signed)
Patient ID: Danny Cook., male   DOB: 1960-09-18, 57 y.o.   MRN: [id number]   Per State regulations 482.30 this chart was reviewed for medical necessity with respect to the patient's admission/duration of stay.    Next review date:11/12/17  Debarah Crape, BSN, RN-BC  Case Manager

## 2017-11-08 NOTE — Progress Notes (Signed)
[facility] MD Progress Note  11/08/2017 12:12 PM Danny Cook.  MRN:  [id number] Subjective: Patient doing much better.  Endorses that he does not have any complaints or concerns at this point.  Feels her medications are doing okay.  Denies any suicidal thoughts or safety issues currently.  Principal Problem: Schizoaffective disorder, depressive type ([place]) Diagnosis:   Patient Active Problem List   Diagnosis Date Noted  . Schizoaffective disorder, depressive type ([place]) [F25.1] 11/04/2017  . HIV positive ([place]) [Z21] 11/04/2017  . Orthostatic dizziness [R42] 07/31/2017  . Chronic diastolic heart failure ([place]) [I50.32] 07/29/2017  . HTN (hypertension) [I10] 07/29/2017  . Diabetes ([place]) [E11.9] 07/29/2017  . Schizophrenia ([place]) [F20.9] 07/29/2017   Total Time spent with patient: 20 minutes  Past Psychiatric History: See H&P  Past Medical History:  Past Medical History:  Diagnosis Date  . Anemia   . Anxiety   . Arthritis   . CHF (congestive heart failure) ([place])   . Chronic kidney disease    Renal Insufficiency Syndrome; Glomerulosclerosis 2013  . Complication of anesthesia   . Depressed   . Diabetes mellitus without complication ([place])   . High cholesterol   . HIV (human immunodeficiency virus infection) ([place])   . Hypertension   . 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 Paranoid disorder ([place])   . Schizophrenia, paranoid ([employer])   . Sleep apnea     Past Surgical History:  Procedure Laterality Date  . COLONOSCOPY WITH PROPOFOL N/A 09/03/2015   Procedure: COLONOSCOPY WITH PROPOFOL;  Surgeon: Lollie Sails, MD;  Location: [facility] ENDOSCOPY;  Service: Endoscopy;  Laterality: N/A;  . DG TEETH FULL    . ESOPHAGOGASTRODUODENOSCOPY (EGD) WITH PROPOFOL N/A 07/01/2016   Procedure: ESOPHAGOGASTRODUODENOSCOPY (EGD) WITH PROPOFOL;  Surgeon: Lollie Sails, MD;  Location: [facility] ENDOSCOPY;  Service: Endoscopy;  Laterality: N/A;   Family History:  Family History  Problem Relation Age of Onset  . Heart  attack Mother   . Diabetes Mellitus II Mother   . Mental illness Mother   . CAD Mother   . Heart attack Father   . CAD Father   . Hypertension Father    Family Psychiatric  History: See H&P Social History:  Social History   Substance and Sexual Activity  Alcohol Use No     Social History   Substance and Sexual Activity  Drug Use No    Social History   Socioeconomic History  . Marital status: Single    Spouse name: Not on file  . Number of children: Not on file  . Years of education: 53  . Highest education level: GED or equivalent  Occupational History  . Not on file  Social Needs  . Financial resource strain: Not hard at all  . Food insecurity:    Worry: Sometimes true    Inability: Never true  . Transportation needs:    Medical: No    Non-medical: No  Tobacco Use  . Smoking status: Never Smoker  . Smokeless tobacco: Never Used  Substance and Sexual Activity  . Alcohol use: No  . Drug use: No  . Sexual activity: Never  Lifestyle  . Physical activity:    Days per week: 0 days    Minutes per session: 0 min  . Stress: To some extent  Relationships  . Social connections:    Talks on phone: More than three times a week    Gets together: Three times a week    Attends religious service: Never  Active member of club or organization: No    Attends meetings of clubs or organizations: Never    Relationship status: Never married  Other Topics Concern  . Not on file  Social History Narrative  . Not on file   Additional Social History:                         Sleep: Good  Appetite:  Good  Current Medications: Current Facility-Administered Medications  Medication Dose Route Frequency Provider Last Rate Last Dose  . abacavir-dolutegravir-lamiVUDine (TRIUMEQ) [id number] MG per tablet 1 tablet  1 tablet Oral Daily Clapacs, Madie Reno, MD   1 tablet at 11/08/17 0806  . acetaminophen (TYLENOL) tablet 650 mg  650 mg Oral Q6H PRN Clapacs, John T, MD       . alum & mag hydroxide-simeth (MAALOX/MYLANTA) 200-200-20 MG/5ML suspension 30 mL  30 mL Oral Q4H PRN Clapacs, John T, MD      . aspirin EC tablet 81 mg  81 mg Oral Daily Clapacs, Madie Reno, MD   81 mg at 11/08/17 0804  . clonazePAM (KLONOPIN) tablet 0.5 mg  0.5 mg Oral TID Marylin Crosby, MD   0.5 mg at 11/08/17 1154  . diphenhydrAMINE (BENADRYL) capsule 50 mg  50 mg Oral QHS PRN Marylin Crosby, MD   50 mg at 11/06/17 2102  . divalproex (DEPAKOTE) DR tablet 500 mg  500 mg Oral Q12H Clapacs, Madie Reno, MD   500 mg at 11/08/17 0804  . furosemide (LASIX) tablet 20 mg  20 mg Oral Daily McNew, Tyson Babinski, MD   20 mg at 11/08/17 0804  . gabapentin (NEURONTIN) capsule 300 mg  300 mg Oral TID Clapacs, Madie Reno, MD   300 mg at 11/08/17 1154  . gemfibrozil (LOPID) tablet 600 mg  600 mg Oral BID AC Clapacs, Madie Reno, MD   600 mg at 11/08/17 0806  . glimepiride (AMARYL) tablet 4 mg  4 mg Oral Q breakfast Clapacs, Madie Reno, MD   4 mg at 11/08/17 0805  . hydrOXYzine (ATARAX/VISTARIL) tablet 50 mg  50 mg Oral TID PRN Clapacs, Madie Reno, MD   50 mg at 11/05/17 2124  . iloperidone (FANAPT) tablet 12 mg  12 mg Oral BID Clapacs, Madie Reno, MD   12 mg at 11/08/17 0805  . insulin aspart (novoLOG) injection 0-15 Units  0-15 Units Subcutaneous TID WC Clapacs, Madie Reno, MD   3 Units at 11/08/17 1155  . insulin glargine (LANTUS) injection 84 Units  84 Units Subcutaneous Redmond School, MD   84 Units at 11/07/17 0849  . lisinopril (PRINIVIL,ZESTRIL) tablet 20 mg  20 mg Oral Daily Clapacs, Madie Reno, MD   20 mg at 11/08/17 0805  . magnesium hydroxide (MILK OF MAGNESIA) suspension 30 mL  30 mL Oral Daily PRN Clapacs, John T, MD      . metFORMIN (GLUCOPHAGE) tablet 1,000 mg  1,000 mg Oral BID WC Clapacs, Madie Reno, MD   1,000 mg at 11/08/17 0804  . mirtazapine (REMERON) tablet 30 mg  30 mg Oral QHS Clapacs, John T, MD   30 mg at 11/07/17 2140  . pantoprazole (PROTONIX) EC tablet 40 mg  40 mg Oral Daily Clapacs, Madie Reno, MD   40 mg at 11/08/17 0805   . rosuvastatin (CRESTOR) tablet 20 mg  20 mg Oral q1800 Clapacs, Madie Reno, MD   20 mg at 11/07/17 1618  . venlafaxine XR (EFFEXOR-XR) 24 hr  capsule 75 mg  75 mg Oral Q breakfast McNew, Tyson Babinski, MD   75 mg at 11/08/17 0805  . vitamin B-12 (CYANOCOBALAMIN) tablet 1,000 mcg  1,000 mcg Oral Daily Clapacs, Madie Reno, MD   1,000 mcg at 11/08/17 0804    Lab Results:  Results for orders placed or performed during the hospital encounter of 11/04/17 (from the past 48 hour(s))  Glucose, capillary     Status: None   Collection Time: 11/06/17  4:21 PM  Result Value Ref Range   Glucose-Capillary 96 65 - 99 mg/dL  Glucose, capillary     Status: Abnormal   Collection Time: 11/06/17  9:00 PM  Result Value Ref Range   Glucose-Capillary 149 (H) 65 - 99 mg/dL  Glucose, capillary     Status: Abnormal   Collection Time: 11/07/17  7:05 AM  Result Value Ref Range   Glucose-Capillary 119 (H) 65 - 99 mg/dL  Glucose, capillary     Status: Abnormal   Collection Time: 11/07/17 11:12 AM  Result Value Ref Range   Glucose-Capillary 155 (H) 65 - 99 mg/dL  Glucose, capillary     Status: None   Collection Time: 11/07/17  4:16 PM  Result Value Ref Range   Glucose-Capillary 80 65 - 99 mg/dL  Glucose, capillary     Status: Abnormal   Collection Time: 11/07/17  9:01 PM  Result Value Ref Range   Glucose-Capillary 64 (L) 65 - 99 mg/dL  Glucose, capillary     Status: Abnormal   Collection Time: 11/07/17  9:21 PM  Result Value Ref Range   Glucose-Capillary 54 (L) 65 - 99 mg/dL  Glucose, capillary     Status: None   Collection Time: 11/07/17  9:37 PM  Result Value Ref Range   Glucose-Capillary 73 65 - 99 mg/dL  Glucose, capillary     Status: Abnormal   Collection Time: 11/07/17  9:53 PM  Result Value Ref Range   Glucose-Capillary 101 (H) 65 - 99 mg/dL  Glucose, capillary     Status: Abnormal   Collection Time: 11/08/17  7:10 AM  Result Value Ref Range   Glucose-Capillary 159 (H) 65 - 99 mg/dL   Comment 1 Notify RN    Glucose, capillary     Status: Abnormal   Collection Time: 11/08/17 11:16 AM  Result Value Ref Range   Glucose-Capillary 169 (H) 65 - 99 mg/dL    Blood Alcohol level:  Lab Results  Component Value Date   ETH <10 76/16/0737    Metabolic Disorder Labs: Lab Results  Component Value Date   HGBA1C 7.8 (H) 11/05/2017   MPG 177.16 11/05/2017   No results found for: PROLACTIN Lab Results  Component Value Date   CHOL 215 (H) 11/05/2017   TRIG 387 (H) 11/05/2017   HDL 32 (L) 11/05/2017   CHOLHDL 6.7 11/05/2017   VLDL 77 (H) 11/05/2017   LDLCALC 106 (H) 11/05/2017    Physical Findings: AIMS: Facial and Oral Movements Muscles of Facial Expression: None, normal Lips and Perioral Area: None, normal Jaw: None, normal Tongue: None, normal,Extremity Movements Upper (arms, wrists, hands, fingers): None, normal Lower (legs, knees, ankles, toes): None, normal, Trunk Movements Neck, shoulders, hips: None, normal, Overall Severity Severity of abnormal movements (highest score from questions above): None, normal Incapacitation due to abnormal movements: None, normal Patient's awareness of abnormal movements (rate only patient's report): No Awareness, Dental Status Current problems with teeth and/or dentures?: No Does patient usually wear dentures?: No  CIWA:  CIWA-Ar  Total: 2 COWS:  COWS Total Score: 3  Musculoskeletal: Strength & Muscle Tone: within normal limits Gait & Station: normal Patient leans: N/A  Psychiatric Specialty Exam: Physical Exam  Nursing note and vitals reviewed.   Review of Systems  All other systems reviewed and are negative.   Blood pressure 120/71, pulse 99, temperature 98.7 F (37.1 C), temperature source Oral, resp. rate 18, height 5\' 7"  (1.702 m), weight 211 lb (95.7 kg), SpO2 93 %.Body mass index is 33.05 kg/m.  General Appearance: Casual  Eye Contact:  Good  Speech:  Clear and Coherent  Volume:  Normal  Mood:  Euthymic  Affect:  Appropriate   Thought Process:  Coherent and Goal Directed  Orientation:  Full (Time, Place, and Person)  Thought Content:  Logical  Suicidal Thoughts:  No  Homicidal Thoughts:  No  Memory:  Immediate;   Good  Judgement:  Fair  Insight:  Fair  Psychomotor Activity:  Normal  Concentration:  Concentration: Fair  Recall:  [employer] of Knowledge:  Fair  Language:  Fair  Akathisia:  No      Assets:  Resilience  ADL's:  Intact  Cognition:  WNL  Sleep:  Number of Hours: 6.15     Treatment Plan Summary: 57 yo male admitted due to suicidal thoughts. Mood is improving and he is sleeping better. No psychotic symptoms.  Plan:  History of schizoaffective disorder -Continue Fanapt 12 mg BID -Continue Depakote 500 mg BID. Lvel 34, no indication to increase dose at that time as his mood is stable -Continue Remeron 30 mg daily -Continue Effexor XR 75 mg daily -Continue lower dose of Klonopin 0.5 mg TID. Discussed tapering off of this medications which he is open to  HIV -Pt reports consistent follow up with his outpatient provider and monitors CD4 count -Triumeq  DM -Glimepiride 4 mg daily -Metformin 1000 mg BID -Lantus 85 units daily  Cre still elevated, IN chart review, he does have history of renal insufficient and glomerular infiltration. He will need PCP follow up with this.   Dispo -Pt will discharge home when stable. He will follow up with [facility] and [facility] Ramond Dial, MD 11/08/2017, 12:12 PM

## 2017-11-09 LAB — GLUCOSE, CAPILLARY: GLUCOSE-CAPILLARY: 161 mg/dL — AB (ref 65–99)

## 2017-11-09 MED ORDER — CLONAZEPAM 1 MG PO TABS: 2.0000 mg | ORAL_TABLET | Freq: Two times a day (BID) | ORAL | Status: DC

## 2017-11-09 MED ORDER — VENLAFAXINE HCL ER 75 MG PO CP24: 75.0000 mg | ORAL_CAPSULE | Freq: Every day | ORAL | 0 refills | Status: DC

## 2017-11-09 MED ORDER — DIPHENHYDRAMINE HCL 50 MG PO CAPS: 50.0000 mg | ORAL_CAPSULE | Freq: Every evening | ORAL | 0 refills | Status: DC | PRN

## 2017-11-09 NOTE — BHH Suicide Risk Assessment (Signed)
[facility] Discharge Suicide Risk Assessment   Principal Problem: Schizoaffective disorder, depressive type [facility]) Discharge Diagnoses:  Patient Active Problem List   Diagnosis Date Noted  . Schizoaffective disorder, depressive type ([place]) [F25.1] 11/04/2017    Priority: High  . HIV positive ([place]) [Z21] 11/04/2017  . Orthostatic dizziness [R42] 07/31/2017  . Chronic diastolic heart failure ([place]) [I50.32] 07/29/2017  . HTN (hypertension) [I10] 07/29/2017  . Diabetes ([place]) [E11.9] 07/29/2017  . Schizophrenia ([place]) [F20.9] 07/29/2017    Mental Status Per Nursing Assessment::   On Admission:  Self-harm thoughts  Demographic Factors:  Male, Caucasian and Living alone  Loss Factors: Loss of significant relationship  Historical Factors: Impulsivity  Risk Reduction Factors:   Positive social support, Positive therapeutic relationship and Positive coping skills or problem solving skills  Continued Clinical Symptoms:  Medical Diagnoses and Treatments/Surgeries  Cognitive Features That Contribute To Risk:  None    Suicide Risk:  Minimal: No identifiable suicidal ideation.    Follow-up Information    Care, [place] on 11/16/2017.   Why:  Please go to your therapist appointment with [facility] on Monday, 11/16/17 at [place]. Your appointment with Dr. Kasandra Knudsen is on Wednesday, 11/25/17 at 10:40AM. Thank you. Contact information: [place] [address] [phone]           Plan Of Care/Follow-up recommendations: Follow up with [place], MD 11/09/2017, 9:43 AM

## 2017-11-09 NOTE — Progress Notes (Signed)
Recreation Therapy Notes  INPATIENT RECREATION TR PLAN  Patient Details Name: Danny Cook. MRN: [id number] DOB: 03/04/1961 Today's Date: 11/09/2017  Rec Therapy Plan Is patient appropriate for Therapeutic Recreation?: Yes Treatment times per week: at least 3 Estimated Length of Stay: 5-7 days TR Treatment/Interventions: Group participation (Comment)  Discharge Criteria Pt will be discharged from therapy if:: Discharged Treatment plan/goals/alternatives discussed and agreed upon by:: Patient/family  Discharge Summary Short term goals set: Patient will identify 3 positive coping skills to decrease depressive symptoms within 5 recreation therapy group sessions Short term goals met: Complete Progress toward goals comments: Groups attended Which groups?: Coping skills, Leisure education, Other (Comment)(Problem Solving) Reason goals not met: N/A Therapeutic equipment acquired: N/A Reason patient discharged from therapy: Discharge from hospital Pt/family agrees with progress & goals achieved: Yes Date patient discharged from therapy: 11/09/17   Rahi Chandonnet 11/09/2017, 3:25 PM

## 2017-11-09 NOTE — Progress Notes (Signed)
  [facility] Adult Case Management Discharge Plan :  Will you be returning to the same living situation after discharge:  Yes,  returning home At discharge, do you have transportation home?: Yes,  peer support. Do you have the ability to pay for your medications: Yes,  insurance  Release of information consent forms completed and in the chart;  Patient's signature needed at discharge.  Patient to Follow up at: Follow-up Information    Care, [place] on 11/16/2017.   Why:  Please go to your therapist appointment with [facility] on Monday, 11/16/17 at [place]. Your appointment with Dr. Kasandra Knudsen is on Wednesday, 11/25/17 at 10:40AM. Thank you. Contact information: [place] [address] [phone]           Next level of care provider has access to [place] and Suicide Prevention discussed: Yes,  with pt and his brother.  Have you used any form of tobacco in the last 30 days? (Cigarettes, Smokeless Tobacco, Cigars, and/or Pipes): No  Has patient been referred to the Quitline?: N/A patient is not a smoker  Patient has been referred for addiction treatment: N/A  Alden Hipp, LCSW 11/09/2017, 8:58 AM

## 2017-11-09 NOTE — Progress Notes (Signed)
Recreation Therapy Notes  Date: 11/09/2017  Time: 9:30 am  Location: Craft Room  Behavioral response: Appropriate   Intervention Topic: Problem Solving  Discussion/Intervention:  Group content on today was focused on problem solving. The group described what problem solving is. Patients expressed how problems affect them and how they deal with problems. Individuals identified healthy ways to deal with problems. Patients explained what normally happens to them when they do not deal with problems. The group expressed reoccurring problems for them. The group participated in the intervention "Ways to Solve problems" where patients were given a chance to explore different ways to solve problems.  Clinical Observations/Feedback:  Patient came to group and identified meditating, listening to music and watching television as things he does to help him problem solve. He stated when he avoids his problem he feels negative. Individual participated in the intervention and was social with peers and staff during group.  Ruben Mahler LRT/CTRS         Ilayda Toda 11/09/2017 10:21 AM

## 2017-11-09 NOTE — Discharge Summary (Signed)
Physician Discharge Summary Note  Patient:  Danny Cook. is an 57 y.o., male MRN:  [id number] DOB:  [birth year] Patient phone:  [phone] (home)  Patient address:   [address] [place] [address],  Total Time spent with patient: 20 minutes  Plus 20 minutes of medication reconciliation, discharge planning, and discharge documentation   Date of Admission:  11/04/2017 Date of Discharge: 11/09/17  Reason for Admission: Suicidal thoughts  Principal Problem: Schizoaffective disorder, depressive type [facility]) Discharge Diagnoses: Patient Active Problem List   Diagnosis Date Noted  . Schizoaffective disorder, depressive type ([place]) [F25.1] 11/04/2017    Priority: High  . HIV positive ([place]) [Z21] 11/04/2017  . Orthostatic dizziness [R42] 07/31/2017  . Chronic diastolic heart failure ([place]) [I50.32] 07/29/2017  . HTN (hypertension) [I10] 07/29/2017  . Diabetes ([place]) [E11.9] 07/29/2017  . Schizophrenia ([place]) [F20.9] 07/29/2017    Past Psychiatric History: See H&P  Past Medical History:  Past Medical History:  Diagnosis Date  . Anemia   . Anxiety   . Arthritis   . CHF (congestive heart failure) ([place])   . Chronic kidney disease    Renal Insufficiency Syndrome; Glomerulosclerosis 2013  . Complication of anesthesia   . Depressed   . Diabetes mellitus without complication ([place])   . High cholesterol   . HIV (human immunodeficiency virus infection) ([place])   . Hypertension   . 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 Paranoid disorder ([place])   . Schizophrenia, paranoid ([place])   . Sleep apnea     Past Surgical History:  Procedure Laterality Date  . COLONOSCOPY WITH PROPOFOL N/A 09/03/2015   Procedure: COLONOSCOPY WITH PROPOFOL;  Surgeon: Lollie Sails, MD;  Location: [facility] ENDOSCOPY;  Service: Endoscopy;  Laterality: N/A;  . DG TEETH FULL    . ESOPHAGOGASTRODUODENOSCOPY (EGD) WITH PROPOFOL N/A 07/01/2016   Procedure: ESOPHAGOGASTRODUODENOSCOPY (EGD) WITH PROPOFOL;  Surgeon:  Lollie Sails, MD;  Location: [facility] ENDOSCOPY;  Service: Endoscopy;  Laterality: N/A;   Family History:  Family History  Problem Relation Age of Onset  . Heart attack Mother   . Diabetes Mellitus II Mother   . Mental illness Mother   . CAD Mother   . Heart attack Father   . CAD Father   . Hypertension Father    Family Psychiatric  History: See H&P Social History:  Social History   Substance and Sexual Activity  Alcohol Use No     Social History   Substance and Sexual Activity  Drug Use No    Social History   Socioeconomic History  . Marital status: Single    Spouse name: Not on file  . Number of children: Not on file  . Years of education: 21  . Highest education level: GED or equivalent  Occupational History  . Not on file  Social Needs  . Financial resource strain: Not hard at all  . Food insecurity:    Worry: Sometimes true    Inability: Never true  . Transportation needs:    Medical: No    Non-medical: No  Tobacco Use  . Smoking status: Never Smoker  . Smokeless tobacco: Never Used  Substance and Sexual Activity  . Alcohol use: No  . Drug use: No  . Sexual activity: Never  Lifestyle  . Physical activity:    Days per week: 0 days    Minutes per session: 0 min  . Stress: To some extent  Relationships  . Social connections:    Talks on phone:  More than three times a week    Gets together: Three times a week    Attends religious service: Never    Active member of club or organization: No    Attends meetings of clubs or organizations: Never    Relationship status: Never married  Other Topics Concern  . Not on file  Social History Narrative  . Not on file    Hospital Course:  Pt was restarted on home medications. Elavil was discontinued. HE was started on Effexor for depression. HE felt that Klonopin was no longer effective for him. It was recommended for him to taper off of this, especially  Since he has history of sleep apnea. He will decrease  to 1 mg TID for now and follow up with Dr. Kasandra Knudsen about further taper schedule. Pt was very calm and cooperative on the unit. HE attended all groups and was social on unit which was helpful for him. ON day of discharge, pt had much brighter affect. HE was feeling much more hopeful. He met with his peer support on the unit which was helpful. They plan to increase his activities which he is looking forward to this. He plans to volunteer to help give him some routine. He denied SI or thoughts of self harm. He was very future oriented. HE denied SI, HI, AH, VH. He was given 7 day supply of Effexor. He requested discharge and did not meet IVC criteria.   The patient is at low risk of imminent suicide. Patient denied thoughts, intent, or plan for harm to self or others, expressed significant future orientation, and expressed an ability to mobilize assistance for his needs. He is presently void of any contributing psychiatric symptoms, cognitive difficulties, or substance use which would elevate his risk for lethality. Chronic risk for lethality is elevated in light of male gender, impulsivity, poor routine. The chronic risk is presently mitigated by his ongoing desire and engagement in [facility] treatment and mobilization of support from family and friends. Chronic risk may elevate if he experiences any significant loss or worsening of symptoms, which can be managed and monitored through outpatient providers. At this time,a cute risk for lethality is low and he is stable for ongoing outpatient management.   Modifiable risk factors were addressed during this hospitalization through appropriate pharmacotherapy and establishment of outpatient follow-up treatment. Some risk factors for suicide are situational (i.e. Unstable housing) or related personality pathology (i.e. Poor coping mechanisms) and thus cannot be further mitigated by continued hospitalization in this setting.    Physical Findings: AIMS: Facial and Oral  Movements Muscles of Facial Expression: None, normal Lips and Perioral Area: None, normal Jaw: None, normal Tongue: None, normal,Extremity Movements Upper (arms, wrists, hands, fingers): None, normal Lower (legs, knees, ankles, toes): None, normal, Trunk Movements Neck, shoulders, hips: None, normal, Overall Severity Severity of abnormal movements (highest score from questions above): None, normal Incapacitation due to abnormal movements: None, normal Patient's awareness of abnormal movements (rate only patient's report): No Awareness, Dental Status Current problems with teeth and/or dentures?: No Does patient usually wear dentures?: No  CIWA:  CIWA-Ar Total: 2 COWS:  COWS Total Score: 3  Musculoskeletal: Strength & Muscle Tone: within normal limits Gait & Station: normal Patient leans: N/A  Psychiatric Specialty Exam: Physical Exam  Nursing note and vitals reviewed.   Review of Systems  All other systems reviewed and are negative.   Blood pressure 126/67, pulse (!) 106, temperature 97.8 F (36.6 C), temperature source Oral, resp. rate 18,  height 5' 7" (1.702 m), weight 95.7 kg (211 lb), SpO2 99 %.Body mass index is 33.05 kg/m.  General Appearance: Casual  Eye Contact:  Good  Speech:  Clear and Coherent  Volume:  Normal  Mood:  Euthymic  Affect:  Appropriate  Thought Process:  Coherent and Goal Directed  Orientation:  Full (Time, Place, and Person)  Thought Content:  Logical  Suicidal Thoughts:  No  Homicidal Thoughts:  No  Memory:  Immediate;   Fair  Judgement:  Fair  Insight:  Good  Psychomotor Activity:  Normal  Concentration:  Concentration: Good  Recall:  Good  Fund of Knowledge:  Good  Language:  Good  Akathisia:  No      Assets:  Resilience  ADL's:  Intact  Cognition:  WNL  Sleep:  Number of Hours: 6.15     Have you used any form of tobacco in the last 30 days? (Cigarettes, Smokeless Tobacco, Cigars, and/or Pipes): No  Has this patient used any form  of tobacco in the last 30 days? (Cigarettes, Smokeless Tobacco, Cigars, and/or Pipes) No  Blood Alcohol level:  Lab Results  Component Value Date   ETH <10 83/15/1761    Metabolic Disorder Labs:  Lab Results  Component Value Date   HGBA1C 7.8 (H) 11/05/2017   MPG 177.16 11/05/2017   No results found for: PROLACTIN Lab Results  Component Value Date   CHOL 215 (H) 11/05/2017   TRIG 387 (H) 11/05/2017   HDL 32 (L) 11/05/2017   CHOLHDL 6.7 11/05/2017   VLDL 77 (H) 11/05/2017   LDLCALC 106 (H) 11/05/2017    See Psychiatric Specialty Exam and Suicide Risk Assessment completed by Attending Physician prior to discharge.  Discharge destination:  Home  Is patient on multiple antipsychotic therapies at discharge:  No   Has Patient had three or more failed trials of antipsychotic monotherapy by history:  No  Recommended Plan for Multiple Antipsychotic Therapies: NA   Allergies as of 11/09/2017      Reactions   Trazodone And Nefazodone    Viread [tenofovir Disoproxil]    Damages kidneys   Etodolac Rash      Medication List    STOP taking these medications   amitriptyline 25 MG tablet Commonly known as:  ELAVIL   amoxicillin-clavulanate 875-125 MG tablet Commonly known as:  AUGMENTIN     TAKE these medications     Indication  aspirin 325 MG tablet Take 325 mg by mouth daily.  Indication:  prophylaxis   clonazePAM 1 MG tablet Commonly known as:  KLONOPIN Take 2 tablets (2 mg total) by mouth 2 (two) times daily. What changed:  medication strength  Indication:  Panic Disorder   cyanocobalamin 1000 MCG tablet Take 1,000 mcg by mouth daily.  Indication:  Inadequate Vitamin B12   diphenhydrAMINE 50 MG capsule Commonly known as:  BENADRYL Take 1 capsule (50 mg total) by mouth at bedtime as needed for sleep.  Indication:  Trouble Sleeping   divalproex 250 MG 24 hr tablet Commonly known as:  DEPAKOTE ER Take 500 mg by mouth 2 (two) times daily.  Indication:   Mood   FANAPT 10 MG Tabs Generic drug:  Iloperidone Take 12 mg by mouth 2 (two) times daily.  Indication:  Schizophrenia   furosemide 20 MG tablet Commonly known as:  LASIX Take 10 mg by mouth daily.  Indication:  Edema   gabapentin 300 MG capsule Commonly known as:  NEURONTIN Take 300 mg by mouth 3 (three)  times daily.  Indication:  Diabetes with Nerve Disease   gemfibrozil 600 MG tablet Commonly known as:  LOPID Take 1 tablet by mouth 2 (two) times daily.  Indication:  HLD   Insulin Degludec 200 UNIT/ML Sopn Inject 20 Units into the skin daily.  Indication:  Type 2 Diabetes   lisinopril 20 MG tablet Commonly known as:  PRINIVIL,ZESTRIL Take 20 mg by mouth daily.  Indication:  High Blood Pressure Disorder   meclizine 25 MG tablet Commonly known as:  ANTIVERT Take 1 tablet (25 mg total) by mouth 3 (three) times daily as needed for dizziness or nausea.  Indication:  Motion Sickness   metFORMIN 1000 MG tablet Commonly known as:  GLUCOPHAGE Take 1,000 mg by mouth 2 (two) times daily with a meal.  Indication:  Type 2 Diabetes   mirtazapine 30 MG tablet Commonly known as:  REMERON Take 30 mg by mouth at bedtime.  Indication:  Major Depressive Disorder   omeprazole 40 MG capsule Commonly known as:  PRILOSEC Take 40 mg by mouth 2 (two) times daily.  Indication:  Gastroesophageal Reflux Disease   ondansetron 4 MG disintegrating tablet Commonly known as:  ZOFRAN ODT Take 1 tablet (4 mg total) by mouth every 8 (eight) hours as needed for nausea or vomiting.  Indication:  Nausea and Vomiting   promethazine 6.25 MG/5ML syrup Commonly known as:  PHENERGAN Take 6.25 mg by mouth every 6 (six) hours as needed for nausea or vomiting.  Indication:  Nausea and Vomiting   rosuvastatin 20 MG tablet Commonly known as:  CRESTOR Take 20 mg by mouth daily.  Indication:  High Amount of Fats in the Blood   sucralfate 1 g tablet Commonly known as:  CARAFATE Take 1 g by mouth 2  (two) times daily.  Indication:  Ulcer of the Duodenum   triamcinolone cream 0.1 % Commonly known as:  KENALOG Apply 1 application topically 2 (two) times daily as needed.  Indication:  Inflammation   TRIUMEQ 600-50-300 MG tablet Generic drug:  abacavir-dolutegravir-lamiVUDine Take 1 tablet by mouth daily.  Indication:  HIV Disease   venlafaxine XR 75 MG 24 hr capsule Commonly known as:  EFFEXOR-XR Take 1 capsule (75 mg total) by mouth daily with breakfast.  Indication:  Major Depressive Disorder      Follow-up Information    [facility] [place] on 11/16/2017.   Why:  Please go to your therapist appointment with [facility] on Monday, 11/16/17 at [place]. Your appointment with Dr. Kasandra Knudsen is on Wednesday, 11/25/17 at 10:40AM. Thank you. Contact information: [place] [address] [phone]           Follow-up recommendations: Follow up with [facility] Signed: Marylin Crosby, MD 11/09/2017, 9:44 AM

## 2017-11-10 NOTE — Progress Notes (Signed)
Patient ID: Danny Cook., male    DOB: [birth year], 57 y.o.   MRN: [id number]  HPI  Mr Aburto is a 57 y/o male with a history of DM, hyperlipidemia, HTN, CKD, HIV, schizophrenia, obstructive sleep apnea, anxiety, osteoarthritis and chronic heart failure.   Echo report from 07/31/17 reviewed and showed an EF of 55-65%. Echo report from 05/11/17 reviewed and showed an EF of 55% along with mild AR/ TR/ MR.  Admitted 11/04/17 due to suicidal thoughts and schizophrenia. Was evaluated by psychiatry and discharged home after 5 days. Admitted 07/30/17 due to hypotension, dizziness and acute kidney injury. Was given IV fWas in the ED 04/26/17 due to hyperglycemia where he was treated and released.  He presents today for a follow-up visit with a chief complaint of minimal shortness of breath upon moderate exertion. This has been chronic in nature having been present for several years. He does feel like his breathing has improved. He has associated fatigue, depression and anxiety along with this. He denies any difficulty sleeping, abdominal distention, palpitations, pedal edema, chest pain, cough, dizziness or weight gain.   Past Medical History:  Diagnosis Date  . Anemia   . Anxiety   . Arthritis   . CHF (congestive heart failure) ([place])   . Chronic kidney disease    Renal Insufficiency Syndrome; Glomerulosclerosis 2013  . Complication of anesthesia   . Depressed   . Diabetes mellitus without complication ([place])   . High cholesterol   . HIV (human immunodeficiency virus infection) ([place])   . Hypertension   . 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 Paranoid disorder ([place])   . Schizophrenia, paranoid ([place])   . Sleep apnea    Past Surgical History:  Procedure Laterality Date  . COLONOSCOPY WITH PROPOFOL N/A 09/03/2015   Procedure: COLONOSCOPY WITH PROPOFOL;  Surgeon: Lollie Sails, MD;  Location: [facility] ENDOSCOPY;  Service: Endoscopy;  Laterality: N/A;  . DG TEETH FULL    . ESOPHAGOGASTRODUODENOSCOPY  (EGD) WITH PROPOFOL N/A 07/01/2016   Procedure: ESOPHAGOGASTRODUODENOSCOPY (EGD) WITH PROPOFOL;  Surgeon: Lollie Sails, MD;  Location: [facility] ENDOSCOPY;  Service: Endoscopy;  Laterality: N/A;   Family History  Problem Relation Age of Onset  . Heart attack Mother   . Diabetes Mellitus II Mother   . Mental illness Mother   . CAD Mother   . Heart attack Father   . CAD Father   . Hypertension Father    Social History   Tobacco Use  . Smoking status: Never Smoker  . Smokeless tobacco: Never Used  Substance Use Topics  . Alcohol use: No   Allergies  Allergen Reactions  . Trazodone And Nefazodone   . Viread [Tenofovir Disoproxil]     Damages kidneys  . Etodolac Rash   Prior to Admission medications   Medication Sig Start Date End Date Taking? Authorizing Provider  Abacavir-Dolutegravir-Lamivud (TRIUMEQ) 600-50-300 MG TABS Take 1 tablet by mouth daily.   Yes [provider]  aspirin 325 MG tablet Take 325 mg by mouth daily.   Yes [provider]  clonazePAM (KLONOPIN) 1 MG tablet Take 0.5 mg by mouth 2 (two) times daily.   Yes [provider]  cyanocobalamin 1000 MCG tablet Take 1,000 mcg by mouth daily.   Yes [provider]  diphenhydrAMINE (BENADRYL) 50 MG capsule Take 1 capsule (50 mg total) by mouth at bedtime as needed for sleep. 11/09/17  Yes McNew, Tyson Babinski, MD  divalproex (DEPAKOTE ER) 250 MG 24 hr tablet  Take 500 mg by mouth 2 (two) times daily.    Yes [provider]  FANAPT 10 MG TABS Take 12 mg by mouth 2 (two) times daily.  11/14/16  Yes [provider]  furosemide (LASIX) 20 MG tablet Take 10 mg by mouth daily.   Yes [provider]  gabapentin (NEURONTIN) 300 MG capsule Take 300 mg by mouth 3 (three) times daily. 08/21/16  Yes [provider]  gemfibrozil (LOPID) 600 MG tablet Take 1 tablet by mouth 2 (two) times daily. 11/12/16  Yes [provider]  Insulin Degludec 200 UNIT/ML SOPN Inject 20  Units into the skin daily. 08/01/17  Yes Wieting, Richard, MD  lisinopril (PRINIVIL,ZESTRIL) 20 MG tablet Take 20 mg by mouth daily.   Yes [provider]  meclizine (ANTIVERT) 25 MG tablet Take 1 tablet (25 mg total) by mouth 3 (three) times daily as needed for dizziness or nausea. 04/12/16  Yes Carrie Mew, MD  metFORMIN (GLUCOPHAGE) 1000 MG tablet Take 1,000 mg by mouth 2 (two) times daily with a meal.   Yes [provider]  mirtazapine (REMERON) 30 MG tablet Take 30 mg by mouth at bedtime.   Yes [provider]  omeprazole (PRILOSEC) 40 MG capsule Take 40 mg by mouth 2 (two) times daily.   Yes [provider]  ondansetron (ZOFRAN ODT) 4 MG disintegrating tablet Take 1 tablet (4 mg total) by mouth every 8 (eight) hours as needed for nausea or vomiting. 04/12/16  Yes Carrie Mew, MD  promethazine (PHENERGAN) 6.25 MG/5ML syrup Take 6.25 mg by mouth every 6 (six) hours as needed for nausea or vomiting.   Yes [provider]  rosuvastatin (CRESTOR) 20 MG tablet Take 20 mg by mouth daily.   Yes [provider]  sucralfate (CARAFATE) 1 g tablet Take 1 g by mouth 2 (two) times daily.   Yes [provider]  triamcinolone cream (KENALOG) 0.1 % Apply 1 application topically 2 (two) times daily as needed. 11/16/16  Yes Little, Traci M, PA-C  venlafaxine XR (EFFEXOR-XR) 75 MG 24 hr capsule Take 1 capsule (75 mg total) by mouth daily with breakfast. 11/09/17  Yes McNew, Tyson Babinski, MD    Review of Systems  Constitutional: Positive for fatigue. Negative for appetite change.  HENT: Negative for congestion, postnasal drip and sore throat.   Eyes: Negative.   Respiratory: Positive for shortness of breath ("improving"). Negative for cough and chest tightness.   Cardiovascular: Negative for chest pain, palpitations and leg swelling.  Gastrointestinal: Negative for abdominal distention and abdominal pain.  Endocrine: Negative.   Genitourinary:  Negative.   Musculoskeletal: Positive for back pain. Negative for neck pain.  Skin: Negative.   Allergic/Immunologic: Negative.   Neurological: Negative for dizziness and light-headedness.  Hematological: Negative for adenopathy. Does not bruise/bleed easily.  Psychiatric/Behavioral: Positive for dysphoric mood. Negative for sleep disturbance. The patient is nervous/anxious.    Vitals:   11/11/17 1055  BP: 108/69  Pulse: 77  Resp: 18  SpO2: 97%  Weight: 222 lb 2 oz (100.8 kg)  Height: 5\' 7"  (1.702 m)   Wt Readings from Last 3 Encounters:  11/11/17 222 lb 2 oz (100.8 kg)  11/04/17 211 lb (95.7 kg)  11/04/17 214 lb (97.1 kg)   Lab Results  Component Value Date   CREATININE 1.82 (H) 11/06/2017   CREATININE 1.43 (H) 11/04/2017   CREATININE 1.25 (H) 08/12/2017    Physical Exam  Constitutional: He is oriented to person, place, and  time. He appears well-developed and well-nourished.  HENT:  Head: Normocephalic and atraumatic.  Neck: Normal range of motion. Neck supple. No JVD present.  Cardiovascular: Normal rate and regular rhythm.  Pulmonary/Chest: Effort normal. He has no wheezes. He has no rales.  Abdominal: Soft. He exhibits no distension. There is no tenderness.  Musculoskeletal: He exhibits no edema or tenderness.  Neurological: He is alert and oriented to person, place, and time.  Skin: Skin is warm and dry.  Psychiatric: He has a normal mood and affect. His behavior is normal. Thought content normal.  Nursing note and vitals reviewed.  Assessment & Plan:  1: Chronic heart failure with preserved ejection fraction- - NYHA class II - euvolemic today - weighing daily; reminded to call for an overnight weight gain of >2 pounds or a weekly weight gain of >5 pounds - weight up 4 pounds since he was last here February 2019 - not adding salt to his food and is trying to follow a low sodium diet - saw cardiology ([place]) 10/21/17 - [facility] has [facility] right  now  2: HTN- - BP looks good today - BMP from 11/06/17 reviewed and showed sodium 137, potassium 4.6 and GFR 39 - saw PCP Ola Spurr) 08/24/17  3: Diabetes- - nonfasting glucose at home this morning was 235 after eating bacon, eggs and skim milk - A1c 11/05/17 was 7.8% - saw endocrinologist Jenetta DownerDina Rich) 10/21/17 - saw nephrologist ([place]) 06/16/17  4: Schizophrenia- - follows closely with psychiatrist - recent hospitalization  Patient did not bring his medications nor a list. Each medication was verbally reviewed with the patient and he was encouraged to bring the bottles to every visit to confirm accuracy of list.  Patient to return in 6 months or sooner for any questions/problems before then.

## 2017-11-11 ENCOUNTER — Encounter: Payer: Self-pay | Admitting: Family

## 2017-11-11 ENCOUNTER — Ambulatory Visit: Payer: Medicare Other | Attending: Family | Admitting: Family

## 2017-11-11 VITALS — BP 108/69 | HR 77 | Resp 18 | Ht 67.0 in | Wt 222.1 lb

## 2017-11-11 DIAGNOSIS — Z833 Family history of diabetes mellitus: Secondary | ICD-10-CM | POA: Diagnosis not present

## 2017-11-11 DIAGNOSIS — Z794 Long term (current) use of insulin: Secondary | ICD-10-CM | POA: Insufficient documentation

## 2017-11-11 DIAGNOSIS — I130 Hypertensive heart and chronic kidney disease with heart failure and stage 1 through stage 4 chronic kidney disease, or unspecified chronic kidney disease: Secondary | ICD-10-CM | POA: Diagnosis not present

## 2017-11-11 DIAGNOSIS — Z79899 Other long term (current) drug therapy: Secondary | ICD-10-CM | POA: Diagnosis not present

## 2017-11-11 DIAGNOSIS — E7800 Pure hypercholesterolemia, unspecified: Secondary | ICD-10-CM | POA: Insufficient documentation

## 2017-11-11 DIAGNOSIS — C469 Kaposi's sarcoma, unspecified: Secondary | ICD-10-CM | POA: Diagnosis not present

## 2017-11-11 DIAGNOSIS — G4733 Obstructive sleep apnea (adult) (pediatric): Secondary | ICD-10-CM | POA: Diagnosis not present

## 2017-11-11 DIAGNOSIS — F419 Anxiety disorder, unspecified: Secondary | ICD-10-CM | POA: Insufficient documentation

## 2017-11-11 DIAGNOSIS — N189 Chronic kidney disease, unspecified: Secondary | ICD-10-CM | POA: Insufficient documentation

## 2017-11-11 DIAGNOSIS — F209 Schizophrenia, unspecified: Secondary | ICD-10-CM | POA: Diagnosis not present

## 2017-11-11 DIAGNOSIS — I509 Heart failure, unspecified: Secondary | ICD-10-CM | POA: Diagnosis present

## 2017-11-11 DIAGNOSIS — E1122 Type 2 diabetes mellitus with diabetic chronic kidney disease: Secondary | ICD-10-CM | POA: Diagnosis not present

## 2017-11-11 DIAGNOSIS — Z8249 Family history of ischemic heart disease and other diseases of the circulatory system: Secondary | ICD-10-CM | POA: Diagnosis not present

## 2017-11-11 DIAGNOSIS — I5032 Chronic diastolic (congestive) heart failure: Secondary | ICD-10-CM

## 2017-11-11 DIAGNOSIS — F329 Major depressive disorder, single episode, unspecified: Secondary | ICD-10-CM | POA: Insufficient documentation

## 2017-11-11 DIAGNOSIS — Z7982 Long term (current) use of aspirin: Secondary | ICD-10-CM | POA: Insufficient documentation

## 2017-11-11 DIAGNOSIS — E119 Type 2 diabetes mellitus without complications: Secondary | ICD-10-CM

## 2017-11-11 DIAGNOSIS — I10 Essential (primary) hypertension: Secondary | ICD-10-CM

## 2017-11-11 DIAGNOSIS — Z888 Allergy status to other drugs, medicaments and biological substances status: Secondary | ICD-10-CM | POA: Diagnosis not present

## 2017-11-11 DIAGNOSIS — B20 Human immunodeficiency virus [HIV] disease: Secondary | ICD-10-CM | POA: Diagnosis not present

## 2017-11-11 DIAGNOSIS — M1990 Unspecified osteoarthritis, unspecified site: Secondary | ICD-10-CM | POA: Diagnosis not present

## 2017-11-11 NOTE — Patient Instructions (Signed)
Continue weighing daily and call for an overnight weight gain of > 2 pounds or a weekly weight gain of >5 pounds. 

## 2017-12-21 ENCOUNTER — Telehealth: Payer: Self-pay | Admitting: Family

## 2017-12-21 NOTE — Telephone Encounter (Signed)
Patient called to say that his left foot/ mid calf is swollen and has been for the last few days or so. No redness/ warmth or pain in that leg. Says that he ate [place] a couple of days ago. Weight has fluctuated between 223-227 pounds.  Currently is taking 10mg  furosemide daily. Advised patient to increase furosemide to 40mg  daily until he can be seen. He says that due to transportation issues, he can't get to the office until later this week. Will see patient in clinic on 12/25/17 and he was instructed to call back if symptoms persist after increasing furosemide.

## 2017-12-25 ENCOUNTER — Ambulatory Visit: Payer: Self-pay | Admitting: Family

## 2018-01-18 ENCOUNTER — Ambulatory Visit: Payer: Self-pay | Admitting: Family

## 2018-01-26 ENCOUNTER — Ambulatory Visit: Payer: Medicare Other | Attending: Family | Admitting: Family

## 2018-01-26 ENCOUNTER — Encounter: Payer: Self-pay | Admitting: Family

## 2018-01-26 VITALS — BP 120/85 | HR 79 | Resp 18 | Ht 67.0 in | Wt 224.4 lb

## 2018-01-26 DIAGNOSIS — F419 Anxiety disorder, unspecified: Secondary | ICD-10-CM | POA: Insufficient documentation

## 2018-01-26 DIAGNOSIS — M1990 Unspecified osteoarthritis, unspecified site: Secondary | ICD-10-CM | POA: Insufficient documentation

## 2018-01-26 DIAGNOSIS — Z7982 Long term (current) use of aspirin: Secondary | ICD-10-CM | POA: Insufficient documentation

## 2018-01-26 DIAGNOSIS — Z79899 Other long term (current) drug therapy: Secondary | ICD-10-CM | POA: Insufficient documentation

## 2018-01-26 DIAGNOSIS — Z8249 Family history of ischemic heart disease and other diseases of the circulatory system: Secondary | ICD-10-CM | POA: Diagnosis not present

## 2018-01-26 DIAGNOSIS — N183 Chronic kidney disease, stage 3 (moderate): Secondary | ICD-10-CM

## 2018-01-26 DIAGNOSIS — F329 Major depressive disorder, single episode, unspecified: Secondary | ICD-10-CM | POA: Insufficient documentation

## 2018-01-26 DIAGNOSIS — F200 Paranoid schizophrenia: Secondary | ICD-10-CM | POA: Insufficient documentation

## 2018-01-26 DIAGNOSIS — I5032 Chronic diastolic (congestive) heart failure: Secondary | ICD-10-CM | POA: Diagnosis not present

## 2018-01-26 DIAGNOSIS — E1122 Type 2 diabetes mellitus with diabetic chronic kidney disease: Secondary | ICD-10-CM | POA: Diagnosis not present

## 2018-01-26 DIAGNOSIS — E785 Hyperlipidemia, unspecified: Secondary | ICD-10-CM | POA: Insufficient documentation

## 2018-01-26 DIAGNOSIS — R5383 Other fatigue: Secondary | ICD-10-CM | POA: Diagnosis present

## 2018-01-26 DIAGNOSIS — G4733 Obstructive sleep apnea (adult) (pediatric): Secondary | ICD-10-CM | POA: Diagnosis not present

## 2018-01-26 DIAGNOSIS — Z794 Long term (current) use of insulin: Secondary | ICD-10-CM | POA: Insufficient documentation

## 2018-01-26 DIAGNOSIS — I130 Hypertensive heart and chronic kidney disease with heart failure and stage 1 through stage 4 chronic kidney disease, or unspecified chronic kidney disease: Secondary | ICD-10-CM | POA: Diagnosis not present

## 2018-01-26 DIAGNOSIS — N189 Chronic kidney disease, unspecified: Secondary | ICD-10-CM | POA: Insufficient documentation

## 2018-01-26 DIAGNOSIS — I10 Essential (primary) hypertension: Secondary | ICD-10-CM

## 2018-01-26 DIAGNOSIS — B20 Human immunodeficiency virus [HIV] disease: Secondary | ICD-10-CM | POA: Diagnosis not present

## 2018-01-26 LAB — BASIC METABOLIC PANEL
ANION GAP: 11 (ref 5–15)
BUN: 16 mg/dL (ref 6–20)
CO2: 27 mmol/L (ref 22–32)
Calcium: 9.6 mg/dL (ref 8.9–10.3)
Chloride: 103 mmol/L (ref 98–111)
Creatinine, Ser: 1.14 mg/dL (ref 0.61–1.24)
GFR calc non Af Amer: >60 mL/min (ref >60)
Glucose, Bld: 80 mg/dL (ref 70–99)
POTASSIUM: 4 mmol/L (ref 3.5–5.1)
SODIUM: 141 mmol/L (ref 135–145)

## 2018-01-26 NOTE — Progress Notes (Signed)
Patient ID: Danny Fairy., male    DOB: 05-22-1961, 57 y.o.   MRN: [id number]  HPI  Danny Cook is a 56 y/o male with a history of DM, hyperlipidemia, HTN, CKD, HIV, schizophrenia, obstructive sleep apnea, anxiety, osteoarthritis and chronic heart failure.   Echo report from 07/31/17 reviewed and showed an EF of 55-65%. Echo report from 05/11/17 reviewed and showed an EF of 55% along with mild AR/ TR/ Danny.  Admitted 11/04/17 due to suicidal thoughts and schizophrenia. Was evaluated by psychiatry and discharged home after 5 days. Admitted 07/30/17 due to hypotension, dizziness and acute kidney injury.   He presents today for a follow-up visit with a chief complaint of minimal fatigue upon moderate exertion. He describes this as chronic in nature having been present for several years. He has associated shortness of breath, chronic back pain and depression along with this. He denies any difficulty sleeping, abdominal distention, palpitations, pedal edema, chest pain, dizziness, cough or weight gain.   Past Medical History:  Diagnosis Date  . Anemia   . Anxiety   . Arthritis   . CHF (congestive heart failure) ([place])   . Chronic kidney disease    Renal Insufficiency Syndrome; Glomerulosclerosis 2013  . Complication of anesthesia   . Depressed   . Diabetes mellitus without complication ([place])   . High cholesterol   . HIV (human immunodeficiency virus infection) ([place])   . Hypertension   . 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 Paranoid disorder ([place])   . Schizophrenia, paranoid ([place])   . Sleep apnea    Past Surgical History:  Procedure Laterality Date  . COLONOSCOPY WITH PROPOFOL N/A 09/03/2015   Procedure: COLONOSCOPY WITH PROPOFOL;  Surgeon: Lollie Sails, MD;  Location: [facility] ENDOSCOPY;  Service: Endoscopy;  Laterality: N/A;  . DG TEETH FULL    . ESOPHAGOGASTRODUODENOSCOPY (EGD) WITH PROPOFOL N/A 07/01/2016   Procedure: ESOPHAGOGASTRODUODENOSCOPY (EGD) WITH PROPOFOL;  Surgeon: Lollie Sails, MD;  Location: [facility] ENDOSCOPY;  Service: Endoscopy;  Laterality: N/A;   Family History  Problem Relation Age of Onset  . Heart attack Mother   . Diabetes Mellitus II Mother   . Mental illness Mother   . CAD Mother   . Heart attack Father   . CAD Father   . Hypertension Father    Social History   Tobacco Use  . Smoking status: Never Smoker  . Smokeless tobacco: Never Used  Substance Use Topics  . Alcohol use: No   Allergies  Allergen Reactions  . Trazodone And Nefazodone   . Viread [Tenofovir Disoproxil]     Damages kidneys  . Etodolac Rash   Prior to Admission medications   Medication Sig Start Date End Date Taking? Authorizing Provider  Abacavir-Dolutegravir-Lamivud (TRIUMEQ) 600-50-300 MG TABS Take 1 tablet by mouth daily.   Yes [provider]  aspirin 325 MG tablet Take 325 mg by mouth daily.   Yes [provider]  clonazePAM (KLONOPIN) 1 MG tablet Take 0.5 mg by mouth 2 (two) times daily.   Yes [provider]  cyanocobalamin 1000 MCG tablet Take 1,000 mcg by mouth daily.   Yes [provider]  diphenhydrAMINE (BENADRYL) 50 MG capsule Take 1 capsule (50 mg total) by mouth at bedtime as needed for sleep. 11/09/17  Yes McNew, Tyson Babinski, MD  divalproex (DEPAKOTE ER) 250 MG 24 hr tablet Take 500 mg by mouth 2 (two) times daily.    Yes [provider]  FANAPT 10 MG TABS  Take 12 mg by mouth 2 (two) times daily.  11/14/16  Yes [provider]  furosemide (LASIX) 20 MG tablet Take 10 mg by mouth daily.   Yes [provider]  gabapentin (NEURONTIN) 300 MG capsule Take 300 mg by mouth 3 (three) times daily. 08/21/16  Yes [provider]  gemfibrozil (LOPID) 600 MG tablet Take 1 tablet by mouth 2 (two) times daily. 11/12/16  Yes [provider]  Insulin Degludec 200 UNIT/ML SOPN Inject 20 Units into the skin daily. 08/01/17  Yes Wieting, Richard, MD  lisinopril (PRINIVIL,ZESTRIL) 20 MG tablet Take 20  mg by mouth daily.   Yes [provider]  meclizine (ANTIVERT) 25 MG tablet Take 1 tablet (25 mg total) by mouth 3 (three) times daily as needed for dizziness or nausea. 04/12/16  Yes Carrie Mew, MD  metFORMIN (GLUCOPHAGE) 1000 MG tablet Take 1,000 mg by mouth 2 (two) times daily with a meal.   Yes [provider]  mirtazapine (REMERON) 30 MG tablet Take 30 mg by mouth at bedtime.   Yes [provider]  omeprazole (PRILOSEC) 40 MG capsule Take 40 mg by mouth 2 (two) times daily.   Yes [provider]  ondansetron (ZOFRAN ODT) 4 MG disintegrating tablet Take 1 tablet (4 mg total) by mouth every 8 (eight) hours as needed for nausea or vomiting. 04/12/16  Yes Carrie Mew, MD  promethazine (PHENERGAN) 6.25 MG/5ML syrup Take 6.25 mg by mouth every 6 (six) hours as needed for nausea or vomiting.   Yes [provider]  rosuvastatin (CRESTOR) 20 MG tablet Take 20 mg by mouth daily.   Yes [provider]  sucralfate (CARAFATE) 1 g tablet Take 1 g by mouth 2 (two) times daily.   Yes [provider]  triamcinolone cream (KENALOG) 0.1 % Apply 1 application topically 2 (two) times daily as needed. 11/16/16  Yes Little, Traci M, PA-C  venlafaxine XR (EFFEXOR-XR) 75 MG 24 hr capsule Take 1 capsule (75 mg total) by mouth daily with breakfast. 11/09/17  Yes McNew, Tyson Babinski, MD   Review of Systems  Constitutional: Positive for fatigue (improving). Negative for appetite change.  HENT: Negative for congestion, postnasal drip and sore throat.   Eyes: Negative.   Respiratory: Positive for shortness of breath (rarely). Negative for cough and chest tightness.   Cardiovascular: Negative for chest pain, palpitations and leg swelling.  Gastrointestinal: Negative for abdominal distention and abdominal pain.  Endocrine: Negative.   Genitourinary: Negative.   Musculoskeletal: Positive for back pain (when standing for long periods of time). Negative for  neck pain.  Skin: Negative.   Allergic/Immunologic: Negative.   Neurological: Negative for dizziness and light-headedness.  Hematological: Negative for adenopathy. Does not bruise/bleed easily.  Psychiatric/Behavioral: Positive for dysphoric mood. Negative for sleep disturbance. The patient is nervous/anxious.    Vitals:   01/26/18 1101  BP: 120/85  Pulse: 79  Resp: 18  SpO2: 97%  Weight: 224 lb 6 oz (101.8 kg)  Height: 5\' 7"  (1.702 m)   Wt Readings from Last 3 Encounters:  01/26/18 224 lb 6 oz (101.8 kg)  11/11/17 222 lb 2 oz (100.8 kg)  11/04/17 214 lb (97.1 kg)   Lab Results  Component Value Date   CREATININE 1.82 (H) 11/06/2017   CREATININE 1.43 (H) 11/04/2017   CREATININE 1.25 (H) 08/12/2017    Physical Exam  Constitutional: He is oriented to person, place, and time. He appears well-developed and well-nourished.  HENT:  Head: Normocephalic and  atraumatic.  Neck: Normal range of motion. Neck supple. No JVD present.  Cardiovascular: Normal rate and regular rhythm.  Pulmonary/Chest: Effort normal. He has no wheezes. He has no rales.  Abdominal: Soft. He exhibits no distension. There is no tenderness.  Musculoskeletal: He exhibits no edema or tenderness.  Neurological: He is alert and oriented to person, place, and time.  Skin: Skin is warm and dry.  Psychiatric: He has a normal mood and affect. His behavior is normal. Thought content normal.  Nursing note and vitals reviewed.  Assessment & Plan:  1: Chronic heart failure with preserved ejection fraction- - NYHA class II - euvolemic today - weighing daily; reminded to call for an overnight weight gain of >2 pounds or a weekly weight gain of >5 pounds - weight stable from last visit 2 months ago - not adding salt to his food and is trying to follow a low sodium diet - saw cardiology ([place]) 10/21/17 - currently has [facility] home health right now - get a BMP today as he had diuretic increased last month but didn't  come for appointment/ labs  2: HTN- - BP looks good today - BMP from 11/06/17 reviewed and showed sodium 137, potassium 4.6 and GFR 39 - saw PCP Ola Spurr) 08/24/17  3: Diabetes- - nonfasting glucose at home this morning was 145 - A1c 11/05/17 was 7.8% - saw endocrinologist Jenetta DownerDina Rich) 10/21/17 - saw nephrologist ([place]) recently  Patient did not bring his medications nor a list. Each medication was verbally reviewed with the patient and he was encouraged to bring the bottles to every visit to confirm accuracy of list.  Return in 4 months or sooner for any questions/problems before then

## 2018-01-26 NOTE — Patient Instructions (Signed)
Continue weighing daily and call for an overnight weight gain of > 2 pounds or a weekly weight gain of >5 pounds. 

## 2018-01-27 ENCOUNTER — Encounter: Payer: Self-pay | Admitting: Family

## 2018-01-30 ENCOUNTER — Emergency Department
Admission: EM | Admit: 2018-01-30 | Discharge: 2018-01-30 | Disposition: A | Discharge status: Home / Self Care | Payer: Medicare Other | Attending: Emergency Medicine | Admitting: Emergency Medicine

## 2018-01-30 ENCOUNTER — Other Ambulatory Visit: Payer: Self-pay

## 2018-01-30 ENCOUNTER — Emergency Department: Payer: Medicare Other

## 2018-01-30 DIAGNOSIS — Z79899 Other long term (current) drug therapy: Secondary | ICD-10-CM | POA: Diagnosis not present

## 2018-01-30 DIAGNOSIS — I5032 Chronic diastolic (congestive) heart failure: Secondary | ICD-10-CM | POA: Diagnosis not present

## 2018-01-30 DIAGNOSIS — B20 Human immunodeficiency virus [HIV] disease: Secondary | ICD-10-CM | POA: Diagnosis not present

## 2018-01-30 DIAGNOSIS — R6884 Jaw pain: Secondary | ICD-10-CM | POA: Insufficient documentation

## 2018-01-30 DIAGNOSIS — E1122 Type 2 diabetes mellitus with diabetic chronic kidney disease: Secondary | ICD-10-CM | POA: Insufficient documentation

## 2018-01-30 DIAGNOSIS — Z794 Long term (current) use of insulin: Secondary | ICD-10-CM | POA: Insufficient documentation

## 2018-01-30 DIAGNOSIS — I130 Hypertensive heart and chronic kidney disease with heart failure and stage 1 through stage 4 chronic kidney disease, or unspecified chronic kidney disease: Secondary | ICD-10-CM | POA: Diagnosis not present

## 2018-01-30 DIAGNOSIS — N189 Chronic kidney disease, unspecified: Secondary | ICD-10-CM | POA: Insufficient documentation

## 2018-01-30 LAB — TROPONIN I: Troponin I: <0.03 ng/mL (ref <0.03)

## 2018-01-30 LAB — CBC
HEMATOCRIT: 37.7 % — AB (ref 40.0–52.0)
Hemoglobin: 13 g/dL (ref 13.0–18.0)
MCH: 32 pg (ref 26.0–34.0)
MCHC: 34.6 g/dL (ref 32.0–36.0)
MCV: 92.6 fL (ref 80.0–100.0)
PLATELETS: 209 10*3/uL (ref 150–440)
RBC: 4.07 MIL/uL — ABNORMAL LOW (ref 4.40–5.90)
RDW: 13.6 % (ref 11.5–14.5)
WBC: 12.7 10*3/uL — ABNORMAL HIGH (ref 3.8–10.6)

## 2018-01-30 LAB — BASIC METABOLIC PANEL
Anion gap: 8 (ref 5–15)
BUN: 15 mg/dL (ref 6–20)
CO2: 23 mmol/L (ref 22–32)
CREATININE: 1.13 mg/dL (ref 0.61–1.24)
Calcium: 9.4 mg/dL (ref 8.9–10.3)
Chloride: 107 mmol/L (ref 98–111)
GFR calc Af Amer: >60 mL/min (ref >60)
GFR calc non Af Amer: >60 mL/min (ref >60)
GLUCOSE: 165 mg/dL — AB (ref 70–99)
POTASSIUM: 4.4 mmol/L (ref 3.5–5.1)
Sodium: 138 mmol/L (ref 135–145)

## 2018-01-30 MED ORDER — OXYCODONE-ACETAMINOPHEN 5-325 MG PO TABS
1.0000 | ORAL_TABLET | Freq: Once | ORAL | Status: AC
  Administered 2018-01-30: 1 via ORAL
  Filled 2018-01-30: qty 1

## 2018-01-30 NOTE — ED Triage Notes (Signed)
Pt comes into the ED via EMS from home with c/o nonradiating BL jaw pain that woke him this morning with SOB. Denies N/V/. States he took 1g tylenol at 4am.

## 2018-01-30 NOTE — ED Notes (Signed)
Pt transported to radiology.

## 2018-01-30 NOTE — ED Notes (Signed)
Pt given sandwich try and diet ginger ale

## 2018-01-30 NOTE — ED Provider Notes (Addendum)
[facility] Emergency Department Provider Note  ____________________________________________   I have reviewed the triage vital signs and the nursing notes. Where available I have reviewed prior notes and, if possible and indicated, outside hospital notes.    HISTORY  Chief Complaint Jaw Pain    HPI Danny Cook. is a 57 y.o. male with history of anxiety, anemia, CHF, HIV, schizoaffective disorder, last echo was February of this year with an EF of 55 to 65%, very mild concentric hypertrophy noted on the left ventricle apparently, patient presents with pain in his jaws when he chews.  It has been going on since last night.  He thinks he was chewing something hard to chew.  He does not have any chest pain shortness of breath.  It is better when he walks around worse when he lies flat or chews something.  He does not have a headache he does have stiff neck he has no difficulty swallowing, he denies any swelling, denies any shortness of breath, hurts to touch on both sides.  Has no pain in his mouth.  No swelling in his mouth.  No teeth.  He has no other complaints.  States that started late last night    Past Medical History:  Diagnosis Date  . Anemia   . Anxiety   . Arthritis   . CHF (congestive heart failure) ([place])   . Chronic kidney disease    Renal Insufficiency Syndrome; Glomerulosclerosis 2013  . Complication of anesthesia   . Depressed   . Diabetes mellitus without complication ([place])   . High cholesterol   . HIV (human immunodeficiency virus infection) ([place])   . Hypertension   . 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 Paranoid disorder ([place])   . Schizophrenia, paranoid ([place])   . Sleep apnea     Patient Active Problem List   Diagnosis Date Noted  . Schizoaffective disorder, depressive type ([place]) 11/04/2017  . HIV positive ([place]) 11/04/2017  . Orthostatic dizziness 07/31/2017  . Chronic diastolic heart failure ([place]) 07/29/2017  . HTN (hypertension)  07/29/2017  . Diabetes ([place]) 07/29/2017  . Schizophrenia ([place]) 07/29/2017    Past Surgical History:  Procedure Laterality Date  . COLONOSCOPY WITH PROPOFOL N/A 09/03/2015   Procedure: COLONOSCOPY WITH PROPOFOL;  Surgeon: Lollie Sails, MD;  Location: [facility] ENDOSCOPY;  Service: Endoscopy;  Laterality: N/A;  . DG TEETH FULL    . ESOPHAGOGASTRODUODENOSCOPY (EGD) WITH PROPOFOL N/A 07/01/2016   Procedure: ESOPHAGOGASTRODUODENOSCOPY (EGD) WITH PROPOFOL;  Surgeon: Lollie Sails, MD;  Location: [facility] ENDOSCOPY;  Service: Endoscopy;  Laterality: N/A;    Prior to Admission medications   Medication Sig Start Date End Date Taking? Authorizing Provider  Abacavir-Dolutegravir-Lamivud (TRIUMEQ) 600-50-300 MG TABS Take 1 tablet by mouth daily.    [provider]  aspirin 325 MG tablet Take 325 mg by mouth daily.    [provider]  clonazePAM (KLONOPIN) 1 MG tablet Take 0.5 mg by mouth 2 (two) times daily.    [provider]  cyanocobalamin 1000 MCG tablet Take 1,000 mcg by mouth daily.    [provider]  diphenhydrAMINE (BENADRYL) 50 MG capsule Take 1 capsule (50 mg total) by mouth at bedtime as needed for sleep. 11/09/17   McNew, Tyson Babinski, MD  divalproex (DEPAKOTE ER) 250 MG 24 hr tablet Take 500 mg by mouth 2 (two) times daily.     [provider]  FANAPT 10 MG TABS Take 12 mg by mouth 2 (two) times daily.  11/14/16   [provider]  furosemide (LASIX) 20 MG tablet Take 10 mg by mouth daily.    [provider]  gabapentin (NEURONTIN) 300 MG capsule Take 300 mg by mouth 3 (three) times daily. 08/21/16   [provider]  gemfibrozil (LOPID) 600 MG tablet Take 1 tablet by mouth 2 (two) times daily. 11/12/16   [provider]  Insulin Degludec 200 UNIT/ML SOPN Inject 20 Units into the skin daily. 08/01/17   Loletha Grayer, MD  lisinopril (PRINIVIL,ZESTRIL) 20 MG tablet Take 20 mg by mouth daily.    [provider]   meclizine (ANTIVERT) 25 MG tablet Take 1 tablet (25 mg total) by mouth 3 (three) times daily as needed for dizziness or nausea. 04/12/16   Carrie Mew, MD  metFORMIN (GLUCOPHAGE) 1000 MG tablet Take 1,000 mg by mouth 2 (two) times daily with a meal.    [provider]  mirtazapine (REMERON) 30 MG tablet Take 30 mg by mouth at bedtime.    [provider]  omeprazole (PRILOSEC) 40 MG capsule Take 40 mg by mouth 2 (two) times daily.    [provider]  ondansetron (ZOFRAN ODT) 4 MG disintegrating tablet Take 1 tablet (4 mg total) by mouth every 8 (eight) hours as needed for nausea or vomiting. 04/12/16   Carrie Mew, MD  promethazine (PHENERGAN) 6.25 MG/5ML syrup Take 6.25 mg by mouth every 6 (six) hours as needed for nausea or vomiting.    [provider]  rosuvastatin (CRESTOR) 20 MG tablet Take 20 mg by mouth daily.    [provider]  sucralfate (CARAFATE) 1 g tablet Take 1 g by mouth 2 (two) times daily.    [provider]  triamcinolone cream (KENALOG) 0.1 % Apply 1 application topically 2 (two) times daily as needed. 11/16/16   Little, Traci M, PA-C  venlafaxine XR (EFFEXOR-XR) 75 MG 24 hr capsule Take 1 capsule (75 mg total) by mouth daily with breakfast. 11/09/17   McNew, Tyson Babinski, MD    Allergies Trazodone and nefazodone; Viread [tenofovir disoproxil]; and Etodolac  Family History  Problem Relation Age of Onset  . Heart attack Mother   . Diabetes Mellitus II Mother   . Mental illness Mother   . CAD Mother   . Heart attack Father   . CAD Father   . Hypertension Father     Social History Social History   Tobacco Use  . Smoking status: Never Smoker  . Smokeless tobacco: Never Used  Substance Use Topics  . Alcohol use: No  . Drug use: No    Review of Systems Constitutional: No fever/chills Eyes: No visual changes. ENT: No sore throat. No stiff neck no neck pain Cardiovascular: Denies chest pain. Respiratory:  Denies shortness of breath. Gastrointestinal:   no vomiting.  No diarrhea.  No constipation. Genitourinary: Negative for dysuria. Musculoskeletal: Negative lower extremity swelling Skin: Negative for rash. Neurological: Negative for severe headaches, focal weakness or numbness.   ____________________________________________   PHYSICAL EXAM:  VITAL SIGNS: ED Triage Vitals  Enc Vitals Group     BP 01/30/18 1005 (!) 154/79     Pulse Rate 01/30/18 1005 (!) 106     Resp 01/30/18 1005 (!) 24     Temp 01/30/18 1006 98.6 F (37 C)     Temp Source 01/30/18 1005 Oral     SpO2 01/30/18 1005 95 %     Weight 01/30/18 1006 226 lb (102.5 kg)     Height  01/30/18 1006 5\' 7"  (1.702 m)     Head Circumference --      Peak Flow --      Pain Score 01/30/18 1006 10     Pain Loc --      Pain Edu? --      Excl. in [place]? --     Constitutional: Alert and oriented. Well appearing and in no acute distress. Eyes: Conjunctivae are normal Head: Atraumatic HEENT: No congestion/rhinnorhea. Mucous membranes are moist.  Oropharynx non-erythematous, he is edentulous.  There is no evidence of intraoral infection or ludwigs angina, his airway is patent, he has no pain or swelling in the mouth.  Palpation of bilateral TMJs reproduces his discomfort.  There is no erythema, but not rather not hot to touch it also hurts when he opens his jaw or bites down hard he states.  No abnormalities noted likely aside from the discomfort he reports when palpated. Neck:   Nontender with no meningismus, no masses, no stridor Cardiovascular: Normal rate, regular rhythm. Grossly normal heart sounds.  Good peripheral circulation. Respiratory: Normal respiratory effort.  No retractions. Lungs CTAB. Abdominal: Soft and nontender. No distention. No guarding no rebound Back:  There is no focal tenderness or step off.  there is no midline tenderness there are no lesions noted. there is no CVA tenderness Musculoskeletal: No lower extremity  tenderness, no upper extremity tenderness. No joint effusions, no DVT signs strong distal pulses no edema Neurologic:  Normal speech and language. No gross focal neurologic deficits are appreciated.  Skin:  Skin is warm, dry and intact. No rash noted. Psychiatric: Mood and affect are somewhat odd. Speech and behavior are normal.  ____________________________________________   LABS (all labs ordered are listed, but only abnormal results are displayed)  Labs Reviewed  BASIC METABOLIC PANEL - Abnormal; Notable for the following components:      Result Value   Glucose, Bld 165 (*)    All other components within normal limits  CBC - Abnormal; Notable for the following components:   WBC 12.7 (*)    RBC 4.07 (*)    HCT 37.7 (*)    All other components within normal limits  TROPONIN I    Pertinent labs  results that were available during my care of the patient were reviewed by me and considered in my medical decision making (see chart for details). ____________________________________________  EKG  I personally interpreted any EKGs ordered by me or triage Sinus rhythm mild tachycardia noted no acute ST elevation or depression, normal axis, morphology consistent with repolarization abnormality. ____________________________________________  RADIOLOGY  Pertinent labs & imaging results that were available during my care of the patient were reviewed by me and considered in my medical decision making (see chart for details). If possible, patient and/or family made aware of any abnormal findings.  Dg Chest 2 View  Result Date: 01/30/2018 CLINICAL DATA:  Jaw pain since early this morning.  Nonsmoker. EXAM: CHEST - 2 VIEW COMPARISON:  CT and radiographs 07/25/2017. FINDINGS: Suboptimal inspiration on both views. The heart size and mediastinal contours are stable. The pulmonary vascularity is normal. The lungs are clear. There is no pleural effusion or pneumothorax. Mild degenerative changes are  present throughout the spine. IMPRESSION: No active cardiopulmonary process. Electronically Signed   By: Richardean Sale M.D.   On: 01/30/2018 10:34   ____________________________________________    PROCEDURES  Procedure(s) performed: None  Procedures  Critical Care performed: None  ____________________________________________   INITIAL IMPRESSION / ASSESSMENT AND  PLAN / ED COURSE  Pertinent labs & imaging results that were available during my care of the patient were reviewed by me and considered in my medical decision making (see chart for details).  Patient here with very reproducible bilateral TMJ discomfort after chewing something last night.  He has schizoaffective disorder which makes history little bit minute.  Exam is quite reassuring, is very reproducible discomfort because jaw pain can sometimes be ACS, even though last echo was quite reassuring few months ago, we will check 2 sets of cardiac markers.  He is pain-free after a single oral analgesic.  KG morphology is quite reassuring although there is limited anterior and inferior ST changes, that are sufficiently of note that I will get a second set of markers but not sufficiently concerning I do not think to send him emergently to the Cath Lab for jaw pain is indicated  ----------------------------------------- 1:41 PM on 01/30/2018 -----------------------------------------  She did no acute distress pain-free after some Percocet here, 2 sets of cardiac enzymes are negative, no chest pain no shortness of breath, work-up otherwise unremarkable, no evidence of acute infection, he is compliant with his antiretrovirals, no fever, reassuring exam on serial exams, no evidence of headache.  He has reproducible bilateral TMJ pain we will refer him to his dentist and his PCP.  Return precautions given and understood.  Patient with uninterrupted discomfort for over 12 hours and no positive troponin no chest pain etc.     ____________________________________________   FINAL CLINICAL IMPRESSION(S) / ED DIAGNOSES  Final diagnoses:  None      This chart was dictated using voice recognition software.  Despite best efforts to proofread,  errors can occur which can change meaning.      Schuyler Amor, MD 01/30/18 1226    Schuyler Amor, MD 01/30/18 1336    Schuyler Amor, MD 01/30/18 1341    Schuyler Amor, MD 01/30/18 1343

## 2018-01-30 NOTE — ED Notes (Signed)
Pt returned from radiology.

## 2018-01-30 NOTE — Discharge Instructions (Addendum)
If you have chest pain, shortness of breath, nausea, vomiting, or you feel worse in any way please return to the emergency department.  Otherwise follow closely with your primary care doctor and your dentist.  Take Tylenol for the pain.

## 2018-01-31 ENCOUNTER — Encounter: Payer: Self-pay | Admitting: Emergency Medicine

## 2018-01-31 ENCOUNTER — Emergency Department
Admission: EM | Admit: 2018-01-31 | Discharge: 2018-01-31 | Disposition: A | Discharge status: Home / Self Care | Payer: Medicare Other | Attending: Emergency Medicine | Admitting: Emergency Medicine

## 2018-01-31 ENCOUNTER — Other Ambulatory Visit: Payer: Self-pay

## 2018-01-31 DIAGNOSIS — R51 Headache: Secondary | ICD-10-CM | POA: Insufficient documentation

## 2018-01-31 DIAGNOSIS — B20 Human immunodeficiency virus [HIV] disease: Secondary | ICD-10-CM | POA: Diagnosis not present

## 2018-01-31 DIAGNOSIS — Z794 Long term (current) use of insulin: Secondary | ICD-10-CM | POA: Diagnosis not present

## 2018-01-31 DIAGNOSIS — Z7982 Long term (current) use of aspirin: Secondary | ICD-10-CM | POA: Insufficient documentation

## 2018-01-31 DIAGNOSIS — E119 Type 2 diabetes mellitus without complications: Secondary | ICD-10-CM | POA: Insufficient documentation

## 2018-01-31 DIAGNOSIS — Z79899 Other long term (current) drug therapy: Secondary | ICD-10-CM | POA: Insufficient documentation

## 2018-01-31 DIAGNOSIS — I110 Hypertensive heart disease with heart failure: Secondary | ICD-10-CM | POA: Diagnosis not present

## 2018-01-31 DIAGNOSIS — R519 Headache, unspecified: Secondary | ICD-10-CM

## 2018-01-31 DIAGNOSIS — I5032 Chronic diastolic (congestive) heart failure: Secondary | ICD-10-CM | POA: Insufficient documentation

## 2018-01-31 DIAGNOSIS — E7800 Pure hypercholesterolemia, unspecified: Secondary | ICD-10-CM | POA: Diagnosis not present

## 2018-01-31 MED ORDER — BUTALBITAL-APAP-CAFFEINE 50-325-40 MG PO TABS: 1.0000 | ORAL_TABLET | Freq: Four times a day (QID) | ORAL | 0 refills | Status: AC | PRN

## 2018-01-31 MED ORDER — SODIUM CHLORIDE 0.9 % IV SOLN
Freq: Once | INTRAVENOUS | Status: AC
  Administered 2018-01-31: 15:00:00 via INTRAVENOUS

## 2018-01-31 MED ORDER — METOCLOPRAMIDE HCL 5 MG/ML IJ SOLN
10.0000 mg | Freq: Once | INTRAMUSCULAR | Status: AC
  Administered 2018-01-31: 10 mg via INTRAVENOUS
  Filled 2018-01-31: qty 2

## 2018-01-31 MED ORDER — KETOROLAC TROMETHAMINE 30 MG/ML IJ SOLN
30.0000 mg | Freq: Once | INTRAMUSCULAR | Status: AC
  Administered 2018-01-31: 30 mg via INTRAVENOUS
  Filled 2018-01-31: qty 1

## 2018-01-31 MED ORDER — BUTALBITAL-APAP-CAFFEINE 50-325-40 MG PO TABS
2.0000 | ORAL_TABLET | Freq: Once | ORAL | Status: AC
  Administered 2018-01-31: 2 via ORAL
  Filled 2018-01-31: qty 2

## 2018-01-31 NOTE — ED Notes (Signed)

## 2018-01-31 NOTE — ED Provider Notes (Signed)
[facility] Emergency Department Provider Note       Time seen: ----------------------------------------- 2:37 PM on 01/31/2018 -----------------------------------------   I have reviewed the triage vital signs and the nursing notes.  HISTORY   Chief Complaint No chief complaint on file.    HPI Danny Cook. is a 57 y.o. male with a history of anemia, anxiety, arthritis, CHF, chronic kidney disease, diabetes, HIV, hypertension, schizophrenia who presents to the ED for severe headache that started yesterday.  Patient complains of photosensitivity and discomfort with loud noises.  He denies fevers, chills, states he was seen yesterday for headache as well as jaw pain.  He denies any other complaints at this time.  He reports he had surveillance labs performed about a week ago for HIV viral load and CD4 count  Past Medical History:  Diagnosis Date  . Anemia   . Anxiety   . Arthritis   . CHF (congestive heart failure) ([place])   . Chronic kidney disease    Renal Insufficiency Syndrome; Glomerulosclerosis 2013  . Complication of anesthesia   . Depressed   . Diabetes mellitus without complication ([place])   . High cholesterol   . HIV (human immunodeficiency virus infection) ([place])   . Hypertension   . 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 Paranoid disorder ([place])   . Schizophrenia, paranoid ([place])   . Sleep apnea     Patient Active Problem List   Diagnosis Date Noted  . Schizoaffective disorder, depressive type ([place]) 11/04/2017  . HIV positive ([place]) 11/04/2017  . Orthostatic dizziness 07/31/2017  . Chronic diastolic heart failure ([place]) 07/29/2017  . HTN (hypertension) 07/29/2017  . Diabetes ([place]) 07/29/2017  . Schizophrenia ([place]) 07/29/2017    Past Surgical History:  Procedure Laterality Date  . COLONOSCOPY WITH PROPOFOL N/A 09/03/2015   Procedure: COLONOSCOPY WITH PROPOFOL;  Surgeon: Lollie Sails, MD;  [facility] ENDOSCOPY;  Service: Endoscopy;   Laterality: N/A;  . DG TEETH FULL    . ESOPHAGOGASTRODUODENOSCOPY (EGD) WITH PROPOFOL N/A 07/01/2016   Procedure: ESOPHAGOGASTRODUODENOSCOPY (EGD) WITH PROPOFOL;  Surgeon: Lollie Sails, MD;  Location: [facility] ENDOSCOPY;  Service: Endoscopy;  Laterality: N/A;    Allergies Trazodone and nefazodone; Viread [tenofovir disoproxil]; and Etodolac  Social History Social History   Tobacco Use  . Smoking status: Never Smoker  . Smokeless tobacco: Never Used  Substance Use Topics  . Alcohol use: No  . Drug use: No   Review of Systems Constitutional: Negative for fever. Cardiovascular: Negative for chest pain. Respiratory: Negative for shortness of breath. Gastrointestinal: Negative for abdominal pain, vomiting and diarrhea. Genitourinary: Negative for dysuria. Musculoskeletal: Negative for back pain. Skin: Negative for rash. Neurological: Positive for headache  All systems negative/normal/unremarkable except as stated in the HPI  ____________________________________________   PHYSICAL EXAM:  VITAL SIGNS: ED Triage Vitals  Enc Vitals Group     BP 01/31/18 1314 113/74     Pulse Rate 01/31/18 1314 (!) 105     Resp 01/31/18 1314 18     Temp 01/31/18 1314 98.5 F (36.9 C)     Temp Source 01/31/18 1314 Oral     SpO2 01/31/18 1314 95 %     Weight 01/31/18 1315 226 lb (102.5 kg)     Height 01/31/18 1315 5\' 7"  (1.702 m)     Head Circumference --      Peak Flow --      Pain Score 01/31/18 1315 9     Pain Loc --  Pain Edu? --      Excl. in [place]? --    Constitutional: Alert and oriented. Well appearing and in no distress. Eyes: Conjunctivae are normal. Normal extraocular movements. ENT   Head: Normocephalic and atraumatic.   Nose: No congestion/rhinnorhea.   Mouth/Throat: Mucous membranes are moist.   Neck: No stridor. Cardiovascular: Normal rate, regular rhythm. No murmurs, rubs, or gallops. Respiratory: Normal respiratory effort without tachypnea nor  retractions. Breath sounds are clear and equal bilaterally. No wheezes/rales/rhonchi. Gastrointestinal: Soft and nontender. Normal bowel sounds Musculoskeletal: Nontender with normal range of motion in extremities. No lower extremity tenderness nor edema. Neurologic:  Normal speech and language. No gross focal neurologic deficits are appreciated.  Skin:  Skin is warm, dry and intact. No rash noted. Psychiatric: Mood and affect are normal. Speech and behavior are normal.  ____________________________________________  ED COURSE:  As part of my medical decision making, I reviewed the following data within the [place] History obtained from family if available, nursing notes, old chart and ekg, as well as notes from prior ED visits. Patient presented for persistent headache, we will assess with labs and imaging as indicated at this time.  Labs yesterday were unremarkable   Procedures ____________________________________________   LABS (pertinent positives/negatives)  Labs Reviewed  HELPER T-LYMPH-CD4 ([facility] ONLY)   ____________________________________________  DIFFERENTIAL DIAGNOSIS   Migraine, tension headache, stress-induced headache  FINAL ASSESSMENT AND PLAN  Headache   Plan: The patient had presented for persistent headache.  I did send out a CD4 count that we would have a reference point in the future.  His headache is nonspecific, posterior and he has no signs of meningitis.  He is currently feeling better, is cleared for outpatient follow-up.   Laurence Aly, MD   Note: This note was generated in part or whole with voice recognition software. Voice recognition is usually quite accurate but there are transcription errors that can and very often do occur. I apologize for any typographical errors that were not detected and corrected.     Earleen Newport, MD 01/31/18 1620

## 2018-01-31 NOTE — ED Triage Notes (Signed)
Pt presents to ED via [facility] from home c/o severe headache that started yesterday. Pt has hx of headaches like this and c/o photosensitivity and discomfort with loud noises. Pt neurologically intact at this time with NAD.

## 2018-02-02 LAB — HELPER T-LYMPH-CD4 (ARMC ONLY)
% CD 4 Pos. Lymph.: 36 % (ref 30.8–58.5)
Absolute CD 4 Helper: 612 /uL (ref 359–1519)
BASOS: 0 %
Basophils Absolute: 0 10*3/uL (ref 0.0–0.2)
EOS (ABSOLUTE): 0 10*3/uL (ref 0.0–0.4)
EOS: 0 %
Hematocrit: 35 % — ABNORMAL LOW (ref 37.5–51.0)
Hemoglobin: 11.7 g/dL — ABNORMAL LOW (ref 13.0–17.7)
Immature Grans (Abs): 0 10*3/uL (ref 0.0–0.1)
Immature Granulocytes: 0 %
LYMPHS ABS: 1.7 10*3/uL (ref 0.7–3.1)
Lymphs: 16 %
MCH: 31.1 pg (ref 26.6–33.0)
MCHC: 33.4 g/dL (ref 31.5–35.7)
MCV: 93 fL (ref 79–97)
Monocytes Absolute: 1.1 10*3/uL — ABNORMAL HIGH (ref 0.1–0.9)
Monocytes: 10 %
NEUTROS PCT: 74 %
Neutrophils Absolute: 7.6 10*3/uL — ABNORMAL HIGH (ref 1.4–7.0)
PLATELETS: 195 10*3/uL (ref 150–450)
RBC: 3.76 x10E6/uL — AB (ref 4.14–5.80)
RDW: 14.4 % (ref 12.3–15.4)
WBC: 10.4 10*3/uL (ref 3.4–10.8)

## 2018-02-22 ENCOUNTER — Emergency Department
Admission: EM | Admit: 2018-02-22 | Discharge: 2018-02-22 | Disposition: A | Discharge status: Home / Self Care | Payer: Medicare Other | Attending: Emergency Medicine | Admitting: Emergency Medicine

## 2018-02-22 ENCOUNTER — Encounter: Payer: Self-pay | Admitting: Emergency Medicine

## 2018-02-22 ENCOUNTER — Emergency Department: Payer: Medicare Other

## 2018-02-22 DIAGNOSIS — R079 Chest pain, unspecified: Secondary | ICD-10-CM | POA: Diagnosis present

## 2018-02-22 LAB — CBC
HCT: 36.7 % — ABNORMAL LOW (ref 40.0–52.0)
Hemoglobin: 12.9 g/dL — ABNORMAL LOW (ref 13.0–18.0)
MCH: 32.2 pg (ref 26.0–34.0)
MCHC: 35.1 g/dL (ref 32.0–36.0)
MCV: 91.7 fL (ref 80.0–100.0)
PLATELETS: 192 10*3/uL (ref 150–440)
RBC: 4 MIL/uL — ABNORMAL LOW (ref 4.40–5.90)
RDW: 14.2 % (ref 11.5–14.5)
WBC: 8.6 10*3/uL (ref 3.8–10.6)

## 2018-02-22 LAB — BASIC METABOLIC PANEL
Anion gap: 9 (ref 5–15)
BUN: 22 mg/dL — AB (ref 6–20)
CALCIUM: 8.8 mg/dL — AB (ref 8.9–10.3)
CO2: 25 mmol/L (ref 22–32)
CREATININE: 1.18 mg/dL (ref 0.61–1.24)
Chloride: 107 mmol/L (ref 98–111)
GFR calc Af Amer: >60 mL/min (ref >60)
Glucose, Bld: 123 mg/dL — ABNORMAL HIGH (ref 70–99)
Potassium: 3.9 mmol/L (ref 3.5–5.1)
Sodium: 141 mmol/L (ref 135–145)

## 2018-02-22 LAB — TROPONIN I: Troponin I: <0.03 ng/mL (ref <0.03)

## 2018-02-22 NOTE — ED Triage Notes (Signed)
Pt arrived via EMS from home with c/o left sided, non-radiating chest pain x3 days. Accompanied by SOB and nausea. Pt has hx/o CHF.

## 2018-02-23 ENCOUNTER — Other Ambulatory Visit
Admission: RE | Admit: 2018-02-23 | Discharge: 2018-02-23 | Disposition: A | Discharge status: Home / Self Care | Payer: Medicare Other | Source: Ambulatory Visit | Attending: Cardiology | Admitting: Cardiology

## 2018-02-23 DIAGNOSIS — R0602 Shortness of breath: Secondary | ICD-10-CM | POA: Diagnosis present

## 2018-02-23 DIAGNOSIS — R079 Chest pain, unspecified: Secondary | ICD-10-CM | POA: Diagnosis present

## 2018-02-23 LAB — TROPONIN I

## 2018-02-27 ENCOUNTER — Other Ambulatory Visit: Payer: Self-pay

## 2018-02-27 ENCOUNTER — Emergency Department
Admission: EM | Admit: 2018-02-27 | Discharge: 2018-02-28 | Disposition: A | Discharge status: Home / Self Care | Payer: Medicare Other | Attending: Emergency Medicine | Admitting: Emergency Medicine

## 2018-02-27 ENCOUNTER — Emergency Department: Payer: Medicare Other

## 2018-02-27 DIAGNOSIS — R531 Weakness: Secondary | ICD-10-CM | POA: Insufficient documentation

## 2018-02-27 DIAGNOSIS — I5032 Chronic diastolic (congestive) heart failure: Secondary | ICD-10-CM | POA: Diagnosis not present

## 2018-02-27 DIAGNOSIS — I130 Hypertensive heart and chronic kidney disease with heart failure and stage 1 through stage 4 chronic kidney disease, or unspecified chronic kidney disease: Secondary | ICD-10-CM | POA: Insufficient documentation

## 2018-02-27 DIAGNOSIS — Z79899 Other long term (current) drug therapy: Secondary | ICD-10-CM | POA: Insufficient documentation

## 2018-02-27 DIAGNOSIS — E860 Dehydration: Secondary | ICD-10-CM | POA: Insufficient documentation

## 2018-02-27 DIAGNOSIS — Z794 Long term (current) use of insulin: Secondary | ICD-10-CM | POA: Insufficient documentation

## 2018-02-27 DIAGNOSIS — Z7982 Long term (current) use of aspirin: Secondary | ICD-10-CM | POA: Diagnosis not present

## 2018-02-27 DIAGNOSIS — E1122 Type 2 diabetes mellitus with diabetic chronic kidney disease: Secondary | ICD-10-CM | POA: Insufficient documentation

## 2018-02-27 DIAGNOSIS — B20 Human immunodeficiency virus [HIV] disease: Secondary | ICD-10-CM | POA: Insufficient documentation

## 2018-02-27 DIAGNOSIS — N289 Disorder of kidney and ureter, unspecified: Secondary | ICD-10-CM | POA: Insufficient documentation

## 2018-02-27 DIAGNOSIS — R42 Dizziness and giddiness: Secondary | ICD-10-CM | POA: Diagnosis present

## 2018-02-27 DIAGNOSIS — N189 Chronic kidney disease, unspecified: Secondary | ICD-10-CM | POA: Insufficient documentation

## 2018-02-27 LAB — URINALYSIS, COMPLETE (UACMP) WITH MICROSCOPIC
Bacteria, UA: NONE SEEN
Bilirubin Urine: NEGATIVE
Hgb urine dipstick: NEGATIVE
KETONES UR: NEGATIVE mg/dL
Leukocytes, UA: NEGATIVE
NITRITE: NEGATIVE
Protein, ur: 100 mg/dL — AB
SPECIFIC GRAVITY, URINE: 1.02 (ref 1.005–1.030)
Squamous Epithelial / HPF: NONE SEEN (ref 0–5)
pH: 5 (ref 5.0–8.0)

## 2018-02-27 LAB — CBC WITH DIFFERENTIAL/PLATELET
BASOS PCT: 1 %
Basophils Absolute: 0.1 10*3/uL (ref 0–0.1)
EOS PCT: 3 %
Eosinophils Absolute: 0.2 10*3/uL (ref 0–0.7)
HCT: 36.4 % — ABNORMAL LOW (ref 40.0–52.0)
Hemoglobin: 12.5 g/dL — ABNORMAL LOW (ref 13.0–18.0)
Lymphocytes Relative: 39 %
Lymphs Abs: 3.1 10*3/uL (ref 1.0–3.6)
MCH: 31.6 pg (ref 26.0–34.0)
MCHC: 34.2 g/dL (ref 32.0–36.0)
MCV: 92.1 fL (ref 80.0–100.0)
Monocytes Absolute: 0.7 10*3/uL (ref 0.2–1.0)
Monocytes Relative: 8 %
Neutro Abs: 4 10*3/uL (ref 1.4–6.5)
Neutrophils Relative %: 49 %
PLATELETS: 159 10*3/uL (ref 150–440)
RBC: 3.95 MIL/uL — ABNORMAL LOW (ref 4.40–5.90)
RDW: 14.2 % (ref 11.5–14.5)
WBC: 8 10*3/uL (ref 3.8–10.6)

## 2018-02-27 LAB — BASIC METABOLIC PANEL
ANION GAP: 11 (ref 5–15)
BUN: 35 mg/dL — AB (ref 6–20)
CHLORIDE: 105 mmol/L (ref 98–111)
CO2: 24 mmol/L (ref 22–32)
Calcium: 8.9 mg/dL (ref 8.9–10.3)
Creatinine, Ser: 2.86 mg/dL — ABNORMAL HIGH (ref 0.61–1.24)
GFR calc Af Amer: 26 mL/min — ABNORMAL LOW (ref >60)
GFR calc non Af Amer: 23 mL/min — ABNORMAL LOW (ref >60)
Glucose, Bld: 171 mg/dL — ABNORMAL HIGH (ref 70–99)
POTASSIUM: 3.9 mmol/L (ref 3.5–5.1)
Sodium: 140 mmol/L (ref 135–145)

## 2018-02-27 MED ORDER — SODIUM CHLORIDE 0.9 % IV BOLUS
1000.0000 mL | Freq: Once | INTRAVENOUS | Status: AC
Start: 2018-02-27 — End: 2018-02-28
  Administered 2018-02-27: 1000 mL via INTRAVENOUS

## 2018-02-27 MED ORDER — SODIUM CHLORIDE 0.9 % IV BOLUS
1000.0000 mL | Freq: Once | INTRAVENOUS | Status: AC
  Administered 2018-02-27: 1000 mL via INTRAVENOUS

## 2018-02-27 NOTE — ED Notes (Signed)
MD cleared pt to drink fluids. Pt given water by RN

## 2018-02-27 NOTE — ED Provider Notes (Signed)
[facility] Emergency Department Provider Note  ____________________________________________  Time seen: Approximately 11:43 PM  I have reviewed the triage vital signs and the nursing notes.   HISTORY  Chief Complaint Near Syncope and Weakness  Level 5 Caveat: Portions of the History and Physical including HPI and review of systems are unable to be completely obtained due to patient being a poor historian    HPI Rory Xiang. is a 57 y.o. male with a history of HIV CKD CHF diabetes who comes to the ED feeling dizzy today.  He states when he stands up he feels lightheaded and he has generalized weakness.  No syncope.  No chest pain shortness of breath belly pain or vomiting or diarrhea.  He reports no recent illnesses.  He does note that on a typical day is only fluid intake is 1 can of diet caffeine free Coke.      Past Medical History:  Diagnosis Date  . Anemia   . Anxiety   . Arthritis   . CHF (congestive heart failure) ([place])   . Chronic kidney disease    Renal Insufficiency Syndrome; Glomerulosclerosis 2013  . Complication of anesthesia   . Depressed   . Diabetes mellitus without complication ([place])   . High cholesterol   . HIV (human immunodeficiency virus infection) ([place])   . Hypertension   . 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 Paranoid disorder ([place])   . Schizophrenia, paranoid ([place])   . Sleep apnea      Patient Active Problem List   Diagnosis Date Noted  . Schizoaffective disorder, depressive type ([place]) 11/04/2017  . HIV positive ([place]) 11/04/2017  . Orthostatic dizziness 07/31/2017  . Chronic diastolic heart failure ([place]) 07/29/2017  . HTN (hypertension) 07/29/2017  . Diabetes ([place]) 07/29/2017  . Schizophrenia ([place]) 07/29/2017     Past Surgical History:  Procedure Laterality Date  . COLONOSCOPY WITH PROPOFOL N/A 09/03/2015   Procedure: COLONOSCOPY WITH PROPOFOL;  Surgeon: Lollie Sails, MD;  Location: [facility] ENDOSCOPY;   Service: Endoscopy;  Laterality: N/A;  . DG TEETH FULL    . ESOPHAGOGASTRODUODENOSCOPY (EGD) WITH PROPOFOL N/A 07/01/2016   Procedure: ESOPHAGOGASTRODUODENOSCOPY (EGD) WITH PROPOFOL;  Surgeon: Lollie Sails, MD;  Location: [facility] ENDOSCOPY;  Service: Endoscopy;  Laterality: N/A;     Prior to Admission medications   Medication Sig Start Date End Date Taking? Authorizing Provider  Abacavir-Dolutegravir-Lamivud (TRIUMEQ) 600-50-300 MG TABS Take 1 tablet by mouth daily.    [provider]  aspirin 325 MG tablet Take 325 mg by mouth daily.    [provider]  butalbital-acetaminophen-caffeine (FIORICET, ESGIC) 50-325-40 MG tablet Take 1-2 tablets by mouth every 6 (six) hours as needed. 01/31/18 01/31/19  Earleen Newport, MD  clonazePAM (KLONOPIN) 1 MG tablet Take 0.5-1 mg by mouth 3 (three) times daily. Take 1 mg in the morning, 0.5 mg mid-day and 1 mg in the evening.    [provider]  cyanocobalamin 1000 MCG tablet Take 1,000 mcg by mouth daily.    [provider]  diphenhydrAMINE (BENADRYL) 50 MG capsule Take 1 capsule (50 mg total) by mouth at bedtime as needed for sleep. 11/09/17   McNew, Tyson Babinski, MD  divalproex (DEPAKOTE ER) 250 MG 24 hr tablet Take 500 mg by mouth 2 (two) times daily.     [provider]  FANAPT 12 MG TABS Take 12 mg by mouth 2 (two) times daily.  11/14/16   [provider]  furosemide (LASIX) 20  MG tablet Take 10 mg by mouth daily.    [provider]  gabapentin (NEURONTIN) 300 MG capsule Take 300 mg by mouth 3 (three) times daily. 08/21/16   [provider]  gemfibrozil (LOPID) 600 MG tablet Take 1 tablet by mouth 2 (two) times daily. 11/12/16   [provider]  Insulin Degludec (TRESIBA FLEXTOUCH [place]) Inject 94 Units into the skin daily.    [provider]  Insulin Degludec 200 UNIT/ML SOPN Inject 20 Units into the skin daily. Patient not taking: Reported on 01/30/2018 08/01/17   Loletha Grayer, MD  liraglutide (VICTOZA) 18 MG/3ML SOPN Inject 1.8 mg into the skin daily.    [provider]  lisinopril (PRINIVIL,ZESTRIL) 20 MG tablet Take 20 mg by mouth daily.    [provider]  meclizine (ANTIVERT) 25 MG tablet Take 1 tablet (25 mg total) by mouth 3 (three) times daily as needed for dizziness or nausea. 04/12/16   Carrie Mew, MD  metFORMIN (GLUCOPHAGE) 1000 MG tablet Take 1,000 mg by mouth 2 (two) times daily with a meal.    [provider]  mirtazapine (REMERON) 30 MG tablet Take 30 mg by mouth at bedtime.    [provider]  omeprazole (PRILOSEC) 40 MG capsule Take 40 mg by mouth 2 (two) times daily.    [provider]  ondansetron (ZOFRAN ODT) 4 MG disintegrating tablet Take 1 tablet (4 mg total) by mouth every 8 (eight) hours as needed for nausea or vomiting. 04/12/16   Carrie Mew, MD  promethazine (PHENERGAN) 6.25 MG/5ML syrup Take 6.25 mg by mouth every 6 (six) hours as needed for nausea or vomiting.    [provider]  rosuvastatin (CRESTOR) 20 MG tablet Take 20 mg by mouth daily.    [provider]  sucralfate (CARAFATE) 1 g tablet Take 1 g by mouth 2 (two) times daily.    [provider]  triamcinolone cream (KENALOG) 0.1 % Apply 1 application topically 2 (two) times daily as needed. 11/16/16   Little, Traci M, PA-C  venlafaxine XR (EFFEXOR-XR) 75 MG 24 hr capsule Take 1 capsule (75 mg total) by mouth daily with breakfast. 11/09/17   McNew, Tyson Babinski, MD     Allergies Trazodone and nefazodone; Viread [tenofovir disoproxil]; and Etodolac   Family History  Problem Relation Age of Onset  . Heart attack Mother   . Diabetes Mellitus II Mother   . Mental illness Mother   . CAD Mother   . Heart attack Father   . CAD Father   . Hypertension Father     Social History Social History   Tobacco Use  . Smoking status: Never Smoker  . Smokeless tobacco: Never Used  Substance Use Topics   . Alcohol use: No  . Drug use: No    Review of Systems  Constitutional:   No fever or chills.  ENT:   No sore throat. No rhinorrhea. Cardiovascular:   No chest pain or syncope. Respiratory:   No dyspnea or cough. Gastrointestinal:   Negative for abdominal pain, vomiting and diarrhea.  Musculoskeletal:   Negative for focal pain or swelling All other systems reviewed and are negative except as documented above in ROS and HPI.  ____________________________________________   PHYSICAL EXAM:  VITAL SIGNS: ED Triage Vitals  Enc Vitals Group     BP 02/27/18 1950 (!) 101/56     Pulse Rate 02/27/18 1950 78     Resp 02/27/18 1950 16  Temp 02/27/18 1950 97.7 F (36.5 C)     Temp Source 02/27/18 1950 Oral     SpO2 02/27/18 1950 96 %     Weight 02/27/18 1948 210 lb (95.3 kg)     Height --      Head Circumference --      Peak Flow --      Pain Score 02/27/18 1948 0     Pain Loc --      Pain Edu? --      Excl. in [place]? --     Vital signs reviewed, nursing assessments reviewed. Orthostatic vital signs positive  Constitutional:   Alert and oriented. Non-toxic appearance. Eyes:   Conjunctivae are normal. EOMI. PERRL. ENT      Head:   Normocephalic and atraumatic.      Nose:   No congestion/rhinnorhea.       Mouth/Throat:   Dry mucous membranes, no pharyngeal erythema. No peritonsillar mass.       Neck:   No meningismus. Full ROM. Hematological/Lymphatic/Immunilogical:   No cervical lymphadenopathy. Cardiovascular:   RRR. Symmetric bilateral radial and DP pulses.  No murmurs. Cap refill less than 2 seconds. Respiratory:   Normal respiratory effort without tachypnea/retractions. Breath sounds are clear and equal bilaterally. No wheezes/rales/rhonchi. Gastrointestinal:   Soft and nontender. Non distended. There is no CVA tenderness.  No rebound, rigidity, or guarding. Genitourinary:   deferred Musculoskeletal:   Normal range of motion in all extremities. No joint effusions.  No  lower extremity tenderness.  No edema. Neurologic:   Normal speech and language.  Motor grossly intact. No acute focal neurologic deficits are appreciated.  Skin:    Skin is warm, dry and intact. No rash noted.  No petechiae, purpura, or bullae.  ____________________________________________    LABS (pertinent positives/negatives) (all labs ordered are listed, but only abnormal results are displayed) Labs Reviewed  BASIC METABOLIC PANEL - Abnormal; Notable for the following components:      Result Value   Glucose, Bld 171 (*)    BUN 35 (*)    Creatinine, Ser 2.86 (*)    GFR calc non Af Amer 23 (*)    GFR calc Af Amer 26 (*)    All other components within normal limits  CBC WITH DIFFERENTIAL/PLATELET - Abnormal; Notable for the following components:   RBC 3.95 (*)    Hemoglobin 12.5 (*)    HCT 36.4 (*)    All other components within normal limits  URINALYSIS, COMPLETE (UACMP) WITH MICROSCOPIC - Abnormal; Notable for the following components:   Color, Urine YELLOW (*)    APPearance CLEAR (*)    Glucose, UA >=500 (*)    Protein, ur 100 (*)    All other components within normal limits   ____________________________________________   EKG  Interpreted by me Sinus rhythm rate of 77, normal axis and intervals.  Normal QRS ST segments and T waves.  ____________________________________________    RADIOLOGY  Ct Renal Stone Study  Result Date: 02/27/2018 CLINICAL DATA:  57 year old male with history of lower abdominal pain, hypotension and weakness. EXAM: CT ABDOMEN AND PELVIS WITHOUT CONTRAST TECHNIQUE: Multidetector CT imaging of the abdomen and pelvis was performed following the standard protocol without IV contrast. COMPARISON:  CT the abdomen and pelvis 06/21/2016. FINDINGS: Lower chest: Unremarkable. Hepatobiliary: No definite cystic or solid hepatic lesions are confidently identified on today's noncontrast CT examination. Unenhanced appearance of the gallbladder is normal.  Pancreas: No pancreatic mass or peripancreatic fluid or inflammatory changes confidently  identified on today's noncontrast CT examination. Spleen: Unremarkable. Adrenals/Urinary Tract: 11 mm nonobstructive calculus in the upper pole the right kidney. No additional calculi are noted within the left kidney, along the course of either ureter, or within the lumen of the urinary bladder. No hydroureteronephrosis. Right adrenal gland is normal in appearance. 4.0 x 3.6 cm low-attenuation (5 Hounsfield unit) left adrenal mass, similar to the prior study, compatible with an adenoma. Urinary bladder is normal in appearance. Stomach/Bowel: Normal appearance of the stomach. No pathologic dilatation of small bowel or colon. Normal appendix. Vascular/Lymphatic: Aortic atherosclerosis. No lymphadenopathy noted in the abdomen or pelvis. Reproductive: Prostate gland and seminal vesicles are unremarkable in appearance. Other: No significant volume of ascites.  No pneumoperitoneum. Musculoskeletal: There are no aggressive appearing lytic or blastic lesions noted in the visualized portions of the skeleton. IMPRESSION: 1. No acute findings are noted in the abdomen or pelvis to account for the patient's symptoms. 2. 11 mm nonobstructive calculus in the upper pole collecting system of the right kidney. No ureteral stones. No findings of urinary tract obstruction. 3. 4.0 x 3.6 cm left adrenal mass is compatible with an adenoma. 4. Aortic atherosclerosis. Electronically Signed   By: Vinnie Langton M.D.   On: 02/27/2018 22:07    ____________________________________________   PROCEDURES Procedures  ____________________________________________  DIFFERENTIAL DIAGNOSIS   Post renal obstruction, urinary tract infection, dehydration  CLINICAL IMPRESSION / ASSESSMENT AND PLAN / ED COURSE  Pertinent labs & imaging results that were available during my care of the patient were reviewed by me and considered in my medical decision  making (see chart for details).    Patient presents with orthostatic dizziness, likely due to dehydration.  Vital signs are unremarkable at rest.  Orthostatics were positive.  Labs show a creatinine of 2.8 compared to a baseline of 1.0.  There is a proportional increase in his BUN and this all appears to be prerenal from dehydration.  Doubt AAA or dissection or renal artery injury.  No evidence of urinary tract infection or pyelonephritis.  No obstructing stone on CT.  Suitable for outpatient follow-up after 2 L IV fluids for hydration.  Patient is feeling better and tolerating oral intake.  Counseled on increasing his water intake, return precautions discussed.  Informed of CT findings.      ____________________________________________   FINAL CLINICAL IMPRESSION(S) / ED DIAGNOSES    Final diagnoses:  Dehydration  Acute renal insufficiency     ED Discharge Orders    None      Portions of this note were generated with dragon dictation software. Dictation errors may occur despite best attempts at proofreading.    Carrie Mew, MD 02/27/18 2348

## 2018-02-27 NOTE — ED Triage Notes (Signed)
Patient reports feeling lightheaded and weak.  Reports checked BP at home several times and it was low.

## 2018-02-27 NOTE — Discharge Instructions (Addendum)
You were significantly dehydrated today.  We gave you lots of IV fluids for hydration.  Be sure to increase your water intake over the next few days and follow-up with your doctor in 3 days.  Return to the ED if your symptoms worsen or if you have inability to eat or drink and more dizziness.  Your creatinine (a measure of kidney function) was 2.8 today.  Please follow-up with your doctor for continued monitoring after further hydration.  Results for orders placed or performed during the hospital encounter of 81/27/51  Basic metabolic panel  Result Value Ref Range   Sodium 140 135 - 145 mmol/L   Potassium 3.9 3.5 - 5.1 mmol/L   Chloride 105 98 - 111 mmol/L   CO2 24 22 - 32 mmol/L   Glucose, Bld 171 (H) 70 - 99 mg/dL   BUN 35 (H) 6 - 20 mg/dL   Creatinine, Ser 2.86 (H) 0.61 - 1.24 mg/dL   Calcium 8.9 8.9 - 10.3 mg/dL   GFR calc non Af Amer 23 (L) >60 mL/min   GFR calc Af Amer 26 (L) >60 mL/min   Anion gap 11 5 - 15  CBC with Differential  Result Value Ref Range   WBC 8.0 3.8 - 10.6 K/uL   RBC 3.95 (L) 4.40 - 5.90 MIL/uL   Hemoglobin 12.5 (L) 13.0 - 18.0 g/dL   HCT 36.4 (L) 40.0 - 52.0 %   MCV 92.1 80.0 - 100.0 fL   MCH 31.6 26.0 - 34.0 pg   MCHC 34.2 32.0 - 36.0 g/dL   RDW 14.2 11.5 - 14.5 %   Platelets 159 150 - 440 K/uL   Neutrophils Relative % 49 %   Neutro Abs 4.0 1.4 - 6.5 K/uL   Lymphocytes Relative 39 %   Lymphs Abs 3.1 1.0 - 3.6 K/uL   Monocytes Relative 8 %   Monocytes Absolute 0.7 0.2 - 1.0 K/uL   Eosinophils Relative 3 %   Eosinophils Absolute 0.2 0 - 0.7 K/uL   Basophils Relative 1 %   Basophils Absolute 0.1 0 - 0.1 K/uL  Urinalysis, Complete w Microscopic  Result Value Ref Range   Color, Urine YELLOW (A) YELLOW   APPearance CLEAR (A) CLEAR   Specific Gravity, Urine 1.020 1.005 - 1.030   pH 5.0 5.0 - 8.0   Glucose, UA >=500 (A) NEGATIVE mg/dL   Hgb urine dipstick NEGATIVE NEGATIVE   Bilirubin Urine NEGATIVE NEGATIVE   Ketones, ur NEGATIVE NEGATIVE mg/dL   Protein, ur 100 (A) NEGATIVE mg/dL   Nitrite NEGATIVE NEGATIVE   Leukocytes, UA NEGATIVE NEGATIVE   RBC / HPF 0-5 0 - 5 RBC/hpf   WBC, UA 0-5 0 - 5 WBC/hpf   Bacteria, UA NONE SEEN NONE SEEN   Squamous Epithelial / LPF NONE SEEN 0 - 5   Dg Chest 2 View  Result Date: 02/22/2018 CLINICAL DATA:  Left chest pain for the past 3 days with some shortness of breath and nausea. EXAM: CHEST - 2 VIEW COMPARISON:  01/30/2018. FINDINGS: Stable enlarged cardiac silhouette. Clear lungs with normal vascularity. Thoracic spine degenerative changes. IMPRESSION: Stable cardiomegaly. No acute cardiopulmonary abnormalities. Electronically Signed   By: Claudie Revering M.D.   On: 02/22/2018 20:04   Dg Chest 2 View  Result Date: 01/30/2018 CLINICAL DATA:  Jaw pain since early this morning.  Nonsmoker. EXAM: CHEST - 2 VIEW COMPARISON:  CT and radiographs 07/25/2017. FINDINGS: Suboptimal inspiration on both views. The heart size and mediastinal contours are  stable. The pulmonary vascularity is normal. The lungs are clear. There is no pleural effusion or pneumothorax. Mild degenerative changes are present throughout the spine. IMPRESSION: No active cardiopulmonary process. Electronically Signed   By: Richardean Sale M.D.   On: 01/30/2018 10:34   Ct Renal Stone Study  Result Date: 02/27/2018 CLINICAL DATA:  57 year old male with history of lower abdominal pain, hypotension and weakness. EXAM: CT ABDOMEN AND PELVIS WITHOUT CONTRAST TECHNIQUE: Multidetector CT imaging of the abdomen and pelvis was performed following the standard protocol without IV contrast. COMPARISON:  CT the abdomen and pelvis 06/21/2016. FINDINGS: Lower chest: Unremarkable. Hepatobiliary: No definite cystic or solid hepatic lesions are confidently identified on today's noncontrast CT examination. Unenhanced appearance of the gallbladder is normal. Pancreas: No pancreatic mass or peripancreatic fluid or inflammatory changes confidently identified on today's  noncontrast CT examination. Spleen: Unremarkable. Adrenals/Urinary Tract: 11 mm nonobstructive calculus in the upper pole the right kidney. No additional calculi are noted within the left kidney, along the course of either ureter, or within the lumen of the urinary bladder. No hydroureteronephrosis. Right adrenal gland is normal in appearance. 4.0 x 3.6 cm low-attenuation (5 Hounsfield unit) left adrenal mass, similar to the prior study, compatible with an adenoma. Urinary bladder is normal in appearance. Stomach/Bowel: Normal appearance of the stomach. No pathologic dilatation of small bowel or colon. Normal appendix. Vascular/Lymphatic: Aortic atherosclerosis. No lymphadenopathy noted in the abdomen or pelvis. Reproductive: Prostate gland and seminal vesicles are unremarkable in appearance. Other: No significant volume of ascites.  No pneumoperitoneum. Musculoskeletal: There are no aggressive appearing lytic or blastic lesions noted in the visualized portions of the skeleton. IMPRESSION: 1. No acute findings are noted in the abdomen or pelvis to account for the patient's symptoms. 2. 11 mm nonobstructive calculus in the upper pole collecting system of the right kidney. No ureteral stones. No findings of urinary tract obstruction. 3. 4.0 x 3.6 cm left adrenal mass is compatible with an adenoma. 4. Aortic atherosclerosis. Electronically Signed   By: Vinnie Langton M.D.   On: 02/27/2018 22:07

## 2018-03-23 ENCOUNTER — Other Ambulatory Visit: Payer: Self-pay

## 2018-03-23 ENCOUNTER — Encounter
Admission: EM | Disposition: A | Discharge status: Home Health | Payer: Self-pay | Source: Home / Self Care | Attending: Specialist

## 2018-03-23 ENCOUNTER — Inpatient Hospital Stay: Payer: Medicare Other

## 2018-03-23 ENCOUNTER — Encounter: Payer: Self-pay | Admitting: *Deleted

## 2018-03-23 ENCOUNTER — Inpatient Hospital Stay
Admission: EM | Admit: 2018-03-23 | Discharge: 2018-03-27 | DRG: 314 | Disposition: A | Discharge status: Home Health | Payer: Medicare Other | Attending: Internal Medicine | Admitting: Internal Medicine

## 2018-03-23 DIAGNOSIS — E669 Obesity, unspecified: Secondary | ICD-10-CM | POA: Diagnosis present

## 2018-03-23 DIAGNOSIS — I309 Acute pericarditis, unspecified: Secondary | ICD-10-CM | POA: Diagnosis present

## 2018-03-23 DIAGNOSIS — N183 Chronic kidney disease, stage 3 (moderate): Secondary | ICD-10-CM | POA: Diagnosis present

## 2018-03-23 DIAGNOSIS — E1142 Type 2 diabetes mellitus with diabetic polyneuropathy: Secondary | ICD-10-CM | POA: Diagnosis present

## 2018-03-23 DIAGNOSIS — N182 Chronic kidney disease, stage 2 (mild): Secondary | ICD-10-CM

## 2018-03-23 DIAGNOSIS — E7800 Pure hypercholesterolemia, unspecified: Secondary | ICD-10-CM | POA: Diagnosis present

## 2018-03-23 DIAGNOSIS — F251 Schizoaffective disorder, depressive type: Secondary | ICD-10-CM | POA: Diagnosis present

## 2018-03-23 DIAGNOSIS — Z8589 Personal history of malignant neoplasm of other organs and systems: Secondary | ICD-10-CM

## 2018-03-23 DIAGNOSIS — R4182 Altered mental status, unspecified: Secondary | ICD-10-CM

## 2018-03-23 DIAGNOSIS — E872 Acidosis: Secondary | ICD-10-CM | POA: Diagnosis present

## 2018-03-23 DIAGNOSIS — I5032 Chronic diastolic (congestive) heart failure: Secondary | ICD-10-CM | POA: Diagnosis present

## 2018-03-23 DIAGNOSIS — I480 Paroxysmal atrial fibrillation: Secondary | ICD-10-CM | POA: Diagnosis not present

## 2018-03-23 DIAGNOSIS — I130 Hypertensive heart and chronic kidney disease with heart failure and stage 1 through stage 4 chronic kidney disease, or unspecified chronic kidney disease: Secondary | ICD-10-CM | POA: Diagnosis present

## 2018-03-23 DIAGNOSIS — N170 Acute kidney failure with tubular necrosis: Secondary | ICD-10-CM | POA: Diagnosis present

## 2018-03-23 DIAGNOSIS — Z9111 Patient's noncompliance with dietary regimen: Secondary | ICD-10-CM

## 2018-03-23 DIAGNOSIS — F419 Anxiety disorder, unspecified: Secondary | ICD-10-CM | POA: Diagnosis present

## 2018-03-23 DIAGNOSIS — I319 Disease of pericardium, unspecified: Secondary | ICD-10-CM | POA: Diagnosis present

## 2018-03-23 DIAGNOSIS — G43909 Migraine, unspecified, not intractable, without status migrainosus: Secondary | ICD-10-CM | POA: Diagnosis present

## 2018-03-23 DIAGNOSIS — E1165 Type 2 diabetes mellitus with hyperglycemia: Secondary | ICD-10-CM | POA: Diagnosis present

## 2018-03-23 DIAGNOSIS — G4733 Obstructive sleep apnea (adult) (pediatric): Secondary | ICD-10-CM | POA: Diagnosis present

## 2018-03-23 DIAGNOSIS — R05 Cough: Secondary | ICD-10-CM

## 2018-03-23 DIAGNOSIS — Z8249 Family history of ischemic heart disease and other diseases of the circulatory system: Secondary | ICD-10-CM | POA: Diagnosis not present

## 2018-03-23 DIAGNOSIS — R0603 Acute respiratory distress: Secondary | ICD-10-CM

## 2018-03-23 DIAGNOSIS — R29708 NIHSS score 8: Secondary | ICD-10-CM | POA: Diagnosis not present

## 2018-03-23 DIAGNOSIS — E860 Dehydration: Secondary | ICD-10-CM | POA: Diagnosis not present

## 2018-03-23 DIAGNOSIS — Z7952 Long term (current) use of systemic steroids: Secondary | ICD-10-CM

## 2018-03-23 DIAGNOSIS — R51 Headache: Secondary | ICD-10-CM | POA: Diagnosis not present

## 2018-03-23 DIAGNOSIS — T464X5A Adverse effect of angiotensin-converting-enzyme inhibitors, initial encounter: Secondary | ICD-10-CM | POA: Diagnosis not present

## 2018-03-23 DIAGNOSIS — R112 Nausea with vomiting, unspecified: Secondary | ICD-10-CM | POA: Diagnosis not present

## 2018-03-23 DIAGNOSIS — B20 Human immunodeficiency virus [HIV] disease: Secondary | ICD-10-CM | POA: Diagnosis present

## 2018-03-23 DIAGNOSIS — G9340 Encephalopathy, unspecified: Secondary | ICD-10-CM | POA: Diagnosis present

## 2018-03-23 DIAGNOSIS — R509 Fever, unspecified: Secondary | ICD-10-CM | POA: Diagnosis not present

## 2018-03-23 DIAGNOSIS — R9431 Abnormal electrocardiogram [ECG] [EKG]: Secondary | ICD-10-CM

## 2018-03-23 DIAGNOSIS — R059 Cough, unspecified: Secondary | ICD-10-CM

## 2018-03-23 DIAGNOSIS — N179 Acute kidney failure, unspecified: Secondary | ICD-10-CM

## 2018-03-23 DIAGNOSIS — R571 Hypovolemic shock: Secondary | ICD-10-CM | POA: Diagnosis present

## 2018-03-23 DIAGNOSIS — Z6834 Body mass index (BMI) 34.0-34.9, adult: Secondary | ICD-10-CM | POA: Diagnosis not present

## 2018-03-23 DIAGNOSIS — E1122 Type 2 diabetes mellitus with diabetic chronic kidney disease: Secondary | ICD-10-CM | POA: Diagnosis present

## 2018-03-23 DIAGNOSIS — I959 Hypotension, unspecified: Secondary | ICD-10-CM | POA: Diagnosis not present

## 2018-03-23 DIAGNOSIS — K5900 Constipation, unspecified: Secondary | ICD-10-CM | POA: Diagnosis present

## 2018-03-23 DIAGNOSIS — F200 Paranoid schizophrenia: Secondary | ICD-10-CM | POA: Diagnosis present

## 2018-03-23 DIAGNOSIS — R531 Weakness: Secondary | ICD-10-CM

## 2018-03-23 DIAGNOSIS — Z452 Encounter for adjustment and management of vascular access device: Secondary | ICD-10-CM

## 2018-03-23 DIAGNOSIS — E876 Hypokalemia: Secondary | ICD-10-CM | POA: Diagnosis present

## 2018-03-23 DIAGNOSIS — R0602 Shortness of breath: Secondary | ICD-10-CM

## 2018-03-23 DIAGNOSIS — Z818 Family history of other mental and behavioral disorders: Secondary | ICD-10-CM

## 2018-03-23 DIAGNOSIS — Z7982 Long term (current) use of aspirin: Secondary | ICD-10-CM

## 2018-03-23 DIAGNOSIS — Z79899 Other long term (current) drug therapy: Secondary | ICD-10-CM

## 2018-03-23 DIAGNOSIS — R4701 Aphasia: Secondary | ICD-10-CM | POA: Diagnosis not present

## 2018-03-23 DIAGNOSIS — I509 Heart failure, unspecified: Secondary | ICD-10-CM | POA: Diagnosis not present

## 2018-03-23 DIAGNOSIS — Z21 Asymptomatic human immunodeficiency virus [HIV] infection status: Secondary | ICD-10-CM | POA: Diagnosis present

## 2018-03-23 DIAGNOSIS — E785 Hyperlipidemia, unspecified: Secondary | ICD-10-CM | POA: Diagnosis present

## 2018-03-23 DIAGNOSIS — Z794 Long term (current) use of insulin: Secondary | ICD-10-CM

## 2018-03-23 DIAGNOSIS — N189 Chronic kidney disease, unspecified: Secondary | ICD-10-CM | POA: Diagnosis not present

## 2018-03-23 DIAGNOSIS — I313 Pericardial effusion (noninflammatory): Secondary | ICD-10-CM | POA: Diagnosis not present

## 2018-03-23 DIAGNOSIS — Z833 Family history of diabetes mellitus: Secondary | ICD-10-CM

## 2018-03-23 DIAGNOSIS — D649 Anemia, unspecified: Secondary | ICD-10-CM | POA: Diagnosis present

## 2018-03-23 DIAGNOSIS — I10 Essential (primary) hypertension: Secondary | ICD-10-CM | POA: Diagnosis present

## 2018-03-23 DIAGNOSIS — K219 Gastro-esophageal reflux disease without esophagitis: Secondary | ICD-10-CM | POA: Diagnosis present

## 2018-03-23 DIAGNOSIS — E119 Type 2 diabetes mellitus without complications: Secondary | ICD-10-CM

## 2018-03-23 DIAGNOSIS — I951 Orthostatic hypotension: Secondary | ICD-10-CM | POA: Diagnosis not present

## 2018-03-23 DIAGNOSIS — N269 Renal sclerosis, unspecified: Secondary | ICD-10-CM | POA: Diagnosis present

## 2018-03-23 LAB — HEPATIC FUNCTION PANEL
ALT: 13 U/L (ref 0–44)
AST: 22 U/L (ref 15–41)
Albumin: 3.5 g/dL (ref 3.5–5.0)
Alkaline Phosphatase: 73 U/L (ref 38–126)
BILIRUBIN INDIRECT: 0.5 mg/dL (ref 0.3–0.9)
Bilirubin, Direct: 0.1 mg/dL (ref 0.0–0.2)
TOTAL PROTEIN: 7.5 g/dL (ref 6.5–8.1)
Total Bilirubin: 0.6 mg/dL (ref 0.3–1.2)

## 2018-03-23 LAB — CBC
HCT: 32.8 % — ABNORMAL LOW (ref 40.0–52.0)
Hemoglobin: 11.2 g/dL — ABNORMAL LOW (ref 13.0–18.0)
MCH: 31.9 pg (ref 26.0–34.0)
MCHC: 34.2 g/dL (ref 32.0–36.0)
MCV: 93.4 fL (ref 80.0–100.0)
PLATELETS: 164 10*3/uL (ref 150–440)
RBC: 3.51 MIL/uL — ABNORMAL LOW (ref 4.40–5.90)
RDW: 14.7 % — ABNORMAL HIGH (ref 11.5–14.5)
WBC: 11.3 10*3/uL — ABNORMAL HIGH (ref 3.8–10.6)

## 2018-03-23 LAB — BASIC METABOLIC PANEL
Anion gap: 14 (ref 5–15)
BUN: 41 mg/dL — ABNORMAL HIGH (ref 6–20)
CALCIUM: 8.3 mg/dL — AB (ref 8.9–10.3)
CO2: 21 mmol/L — AB (ref 22–32)
CREATININE: 4.52 mg/dL — AB (ref 0.61–1.24)
Chloride: 101 mmol/L (ref 98–111)
GFR calc non Af Amer: 13 mL/min — ABNORMAL LOW (ref >60)
GFR, EST AFRICAN AMERICAN: 15 mL/min — AB (ref >60)
GLUCOSE: 163 mg/dL — AB (ref 70–99)
Potassium: 4.3 mmol/L (ref 3.5–5.1)
Sodium: 136 mmol/L (ref 135–145)

## 2018-03-23 LAB — TROPONIN I: TROPONIN I: 0.03 ng/mL — AB (ref <0.03)

## 2018-03-23 LAB — APTT: APTT: 155 s — AB (ref 24–36)

## 2018-03-23 LAB — GLUCOSE, CAPILLARY: GLUCOSE-CAPILLARY: 105 mg/dL — AB (ref 70–99)

## 2018-03-23 LAB — LIPASE, BLOOD: LIPASE: 34 U/L (ref 11–51)

## 2018-03-23 LAB — PROTIME-INR
INR: 1.34
Prothrombin Time: 16.5 seconds — ABNORMAL HIGH (ref 11.4–15.2)

## 2018-03-23 LAB — MAGNESIUM: MAGNESIUM: 1.9 mg/dL (ref 1.7–2.4)

## 2018-03-23 SURGERY — CORONARY/GRAFT ACUTE MI REVASCULARIZATION
Anesthesia: Moderate Sedation

## 2018-03-23 MED ORDER — ACETAMINOPHEN 325 MG PO TABS
650.0000 mg | ORAL_TABLET | Freq: Four times a day (QID) | ORAL | Status: DC | PRN
  Administered 2018-03-24: 650 mg via ORAL
  Filled 2018-03-23: qty 2

## 2018-03-23 MED ORDER — SODIUM CHLORIDE 0.9 % IV BOLUS
1000.0000 mL | Freq: Once | INTRAVENOUS | Status: AC
  Administered 2018-03-23: 1000 mL via INTRAVENOUS

## 2018-03-23 MED ORDER — ABACAVIR-DOLUTEGRAVIR-LAMIVUD 600-50-300 MG PO TABS
1.0000 | ORAL_TABLET | Freq: Every day | ORAL | Status: DC
  Administered 2018-03-24 (×2): 1 via ORAL
  Filled 2018-03-23 (×2): qty 1

## 2018-03-23 MED ORDER — OXYCODONE HCL 5 MG PO TABS: 5.0000 mg | ORAL_TABLET | ORAL | Status: DC | PRN

## 2018-03-23 MED ORDER — CLONAZEPAM 0.5 MG PO TABS
0.5000 mg | ORAL_TABLET | Freq: Every day | ORAL | Status: DC
  Administered 2018-03-24: 0.5 mg via ORAL
  Filled 2018-03-23: qty 1

## 2018-03-23 MED ORDER — PHENYLEPHRINE HCL-NACL 10-0.9 MG/250ML-% IV SOLN
0.0000 ug/min | INTRAVENOUS | Status: DC
  Administered 2018-03-23: 20 ug/min via INTRAVENOUS
  Administered 2018-03-24: 150 ug/min via INTRAVENOUS
  Administered 2018-03-24 (×2): 160 ug/min via INTRAVENOUS
  Administered 2018-03-24 (×2): 150 ug/min via INTRAVENOUS
  Administered 2018-03-25: 20 ug/min via INTRAVENOUS
  Filled 2018-03-23 (×3): qty 250
  Filled 2018-03-23: qty 10
  Filled 2018-03-23 (×4): qty 250
  Filled 2018-03-23: qty 10
  Filled 2018-03-23 (×2): qty 250

## 2018-03-23 MED ORDER — SODIUM CHLORIDE 0.9 % IV SOLN
INTRAVENOUS | Status: AC
  Administered 2018-03-23: 22:00:00 via INTRAVENOUS

## 2018-03-23 MED ORDER — ACETAMINOPHEN 650 MG RE SUPP: 650.0000 mg | Freq: Four times a day (QID) | RECTAL | Status: DC | PRN

## 2018-03-23 MED ORDER — HEPARIN BOLUS VIA INFUSION
4000.0000 [IU] | Freq: Once | INTRAVENOUS | Status: DC
  Filled 2018-03-23: qty 4000

## 2018-03-23 MED ORDER — CLONAZEPAM 0.5 MG PO TABS
1.0000 mg | ORAL_TABLET | Freq: Two times a day (BID) | ORAL | Status: DC
  Administered 2018-03-23 – 2018-03-24 (×3): 1 mg via ORAL
  Filled 2018-03-23 (×4): qty 2

## 2018-03-23 MED ORDER — ASPIRIN EC 325 MG PO TBEC
325.0000 mg | DELAYED_RELEASE_TABLET | Freq: Every day | ORAL | Status: DC
  Administered 2018-03-24 – 2018-03-27 (×4): 325 mg via ORAL
  Filled 2018-03-23 (×4): qty 1

## 2018-03-23 MED ORDER — HEPARIN SODIUM (PORCINE) 5000 UNIT/ML IJ SOLN
4000.0000 [IU] | Freq: Once | INTRAMUSCULAR | Status: AC
  Administered 2018-03-23: 4000 [IU] via INTRAVENOUS
  Filled 2018-03-23: qty 1

## 2018-03-23 MED ORDER — HEPARIN SODIUM (PORCINE) 5000 UNIT/ML IJ SOLN
5000.0000 [IU] | Freq: Three times a day (TID) | INTRAMUSCULAR | Status: DC
  Administered 2018-03-24 – 2018-03-25 (×4): 5000 [IU] via SUBCUTANEOUS
  Filled 2018-03-23 (×4): qty 1

## 2018-03-23 MED ORDER — ILOPERIDONE 4 MG PO TABS
12.0000 mg | ORAL_TABLET | Freq: Two times a day (BID) | ORAL | Status: DC
  Administered 2018-03-24 – 2018-03-27 (×8): 12 mg via ORAL
  Filled 2018-03-23 (×9): qty 3

## 2018-03-23 MED ORDER — ASPIRIN 81 MG PO CHEW
324.0000 mg | CHEWABLE_TABLET | Freq: Once | ORAL | Status: AC
  Administered 2018-03-23: 324 mg via ORAL
  Filled 2018-03-23: qty 4

## 2018-03-23 MED ORDER — HEPARIN (PORCINE) IN NACL 100-0.45 UNIT/ML-% IJ SOLN
1200.0000 [IU]/h | INTRAMUSCULAR | Status: DC
  Filled 2018-03-23: qty 250

## 2018-03-23 MED ORDER — ONDANSETRON HCL 4 MG PO TABS: 4.0000 mg | ORAL_TABLET | Freq: Four times a day (QID) | ORAL | Status: DC | PRN

## 2018-03-23 MED ORDER — INSULIN ASPART 100 UNIT/ML ~~LOC~~ SOLN
0.0000 [IU] | SUBCUTANEOUS | Status: DC
  Administered 2018-03-24 (×2): 1 [IU] via SUBCUTANEOUS
  Administered 2018-03-25: 2 [IU] via SUBCUTANEOUS
  Administered 2018-03-25: 3 [IU] via SUBCUTANEOUS
  Administered 2018-03-25: 2 [IU] via SUBCUTANEOUS
  Administered 2018-03-25: 3 [IU] via SUBCUTANEOUS
  Administered 2018-03-25: 2 [IU] via SUBCUTANEOUS
  Administered 2018-03-26: 7 [IU] via SUBCUTANEOUS
  Administered 2018-03-26: 2 [IU] via SUBCUTANEOUS
  Administered 2018-03-26: 3 [IU] via SUBCUTANEOUS
  Administered 2018-03-26: 2 [IU] via SUBCUTANEOUS
  Administered 2018-03-26 – 2018-03-27 (×2): 3 [IU] via SUBCUTANEOUS
  Administered 2018-03-27 (×2): 1 [IU] via SUBCUTANEOUS
  Administered 2018-03-27: 2 [IU] via SUBCUTANEOUS
  Filled 2018-03-23 (×17): qty 1

## 2018-03-23 MED ORDER — PANTOPRAZOLE SODIUM 40 MG PO TBEC
40.0000 mg | DELAYED_RELEASE_TABLET | Freq: Two times a day (BID) | ORAL | Status: DC
  Administered 2018-03-23 – 2018-03-27 (×8): 40 mg via ORAL
  Filled 2018-03-23 (×8): qty 1

## 2018-03-23 MED ORDER — ENOXAPARIN SODIUM 40 MG/0.4ML ~~LOC~~ SOLN: 40.0000 mg | SUBCUTANEOUS | Status: DC

## 2018-03-23 MED ORDER — ONDANSETRON HCL 4 MG/2ML IJ SOLN: 4.0000 mg | Freq: Four times a day (QID) | INTRAMUSCULAR | Status: DC | PRN

## 2018-03-23 MED ORDER — ROSUVASTATIN CALCIUM 20 MG PO TABS
20.0000 mg | ORAL_TABLET | Freq: Every day | ORAL | Status: DC
  Administered 2018-03-23 – 2018-03-27 (×5): 20 mg via ORAL
  Filled 2018-03-23: qty 1
  Filled 2018-03-23: qty 2
  Filled 2018-03-23: qty 1
  Filled 2018-03-23: qty 2
  Filled 2018-03-23: qty 1
  Filled 2018-03-23: qty 2
  Filled 2018-03-23 (×2): qty 1
  Filled 2018-03-23: qty 2

## 2018-03-23 MED ORDER — DIVALPROEX SODIUM ER 500 MG PO TB24
500.0000 mg | ORAL_TABLET | Freq: Two times a day (BID) | ORAL | Status: DC
  Administered 2018-03-24 – 2018-03-27 (×8): 500 mg via ORAL
  Filled 2018-03-23 (×9): qty 1

## 2018-03-23 MED ORDER — VENLAFAXINE HCL ER 75 MG PO CP24
75.0000 mg | ORAL_CAPSULE | Freq: Every day | ORAL | Status: DC
  Administered 2018-03-24 – 2018-03-27 (×4): 75 mg via ORAL
  Filled 2018-03-23 (×4): qty 1

## 2018-03-23 NOTE — ED Notes (Signed)
Stemi called in to [facility]

## 2018-03-23 NOTE — ED Provider Notes (Addendum)
[facility] Emergency Department Provider Note  ____________________________________________  Time seen: Approximately 7:39 PM  I have reviewed the triage vital signs and the nursing notes.   HISTORY  Chief Complaint Dehydration    HPI Danny Cook. is a 57 y.o. male who complains of generalized weakness for the past 3 or 4 days and decreased appetite and decreased oral intake.  Reports that he saw his doctor today who checked labs and told him to come to the ED for fluids because he was dehydrated.  Patient denies any focal pain no chest pain shortness of breath or exertional symptoms.  No fevers or chills.  No headache vision changes or syncope.  Symptoms are constant, severe, no aggravating or alleviating factors.      Past Medical History:  Diagnosis Date  . Anemia   . Anxiety   . Arthritis   . CHF (congestive heart failure) ([place])   . Chronic kidney disease    Renal Insufficiency Syndrome; Glomerulosclerosis 2013  . Complication of anesthesia   . Depressed   . Diabetes mellitus without complication ([place])   . High cholesterol   . HIV (human immunodeficiency virus infection) ([place])   . Hypertension   . 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 Paranoid disorder ([place])   . Schizophrenia, paranoid ([place])   . Sleep apnea      Patient Active Problem List   Diagnosis Date Noted  . Schizoaffective disorder, depressive type ([place]) 11/04/2017  . HIV positive ([place]) 11/04/2017  . Orthostatic dizziness 07/31/2017  . Chronic diastolic heart failure ([place]) 07/29/2017  . HTN (hypertension) 07/29/2017  . Diabetes ([place]) 07/29/2017  . Schizophrenia ([place]) 07/29/2017     Past Surgical History:  Procedure Laterality Date  . COLONOSCOPY WITH PROPOFOL N/A 09/03/2015   Procedure: COLONOSCOPY WITH PROPOFOL;  Surgeon: Lollie Sails, MD;  Location: [facility] ENDOSCOPY;  Service: Endoscopy;  Laterality: N/A;  . DG TEETH FULL    . ESOPHAGOGASTRODUODENOSCOPY (EGD) WITH  PROPOFOL N/A 07/01/2016   Procedure: ESOPHAGOGASTRODUODENOSCOPY (EGD) WITH PROPOFOL;  Surgeon: Lollie Sails, MD;  Location: [facility] ENDOSCOPY;  Service: Endoscopy;  Laterality: N/A;     Prior to Admission medications   Medication Sig Start Date End Date Taking? Authorizing Provider  Abacavir-Dolutegravir-Lamivud (TRIUMEQ) 600-50-300 MG TABS Take 1 tablet by mouth daily.    [provider]  aspirin 325 MG tablet Take 325 mg by mouth daily.    [provider]  butalbital-acetaminophen-caffeine (FIORICET, ESGIC) 50-325-40 MG tablet Take 1-2 tablets by mouth every 6 (six) hours as needed. 01/31/18 01/31/19  Earleen Newport, MD  clonazePAM (KLONOPIN) 1 MG tablet Take 0.5-1 mg by mouth 3 (three) times daily. Take 1 mg in the morning, 0.5 mg mid-day and 1 mg in the evening.    [provider]  cyanocobalamin 1000 MCG tablet Take 1,000 mcg by mouth daily.    [provider]  diphenhydrAMINE (BENADRYL) 50 MG capsule Take 1 capsule (50 mg total) by mouth at bedtime as needed for sleep. 11/09/17   McNew, Tyson Babinski, MD  divalproex (DEPAKOTE ER) 250 MG 24 hr tablet Take 500 mg by mouth 2 (two) times daily.     [provider]  FANAPT 12 MG TABS Take 12 mg by mouth 2 (two) times daily.  11/14/16   [provider]  furosemide (LASIX) 20 MG tablet Take 10 mg by mouth daily.    [provider]  gabapentin (NEURONTIN) 300 MG capsule Take 300 mg by mouth  3 (three) times daily. 08/21/16   [provider]  gemfibrozil (LOPID) 600 MG tablet Take 1 tablet by mouth 2 (two) times daily. 11/12/16   [provider]  Insulin Degludec (TRESIBA FLEXTOUCH [place]) Inject 94 Units into the skin daily.    [provider]  Insulin Degludec 200 UNIT/ML SOPN Inject 20 Units into the skin daily. Patient not taking: Reported on 01/30/2018 08/01/17   Loletha Grayer, MD  liraglutide (VICTOZA) 18 MG/3ML SOPN Inject 1.8 mg into the skin daily.    [provider]  lisinopril (PRINIVIL,ZESTRIL) 20 MG tablet Take 20 mg by mouth daily.    [provider]  meclizine (ANTIVERT) 25 MG tablet Take 1 tablet (25 mg total) by mouth 3 (three) times daily as needed for dizziness or nausea. 04/12/16   Carrie Mew, MD  metFORMIN (GLUCOPHAGE) 1000 MG tablet Take 1,000 mg by mouth 2 (two) times daily with a meal.    [provider]  mirtazapine (REMERON) 30 MG tablet Take 30 mg by mouth at bedtime.    [provider]  omeprazole (PRILOSEC) 40 MG capsule Take 40 mg by mouth 2 (two) times daily.    [provider]  ondansetron (ZOFRAN ODT) 4 MG disintegrating tablet Take 1 tablet (4 mg total) by mouth every 8 (eight) hours as needed for nausea or vomiting. 04/12/16   Carrie Mew, MD  promethazine (PHENERGAN) 6.25 MG/5ML syrup Take 6.25 mg by mouth every 6 (six) hours as needed for nausea or vomiting.    [provider]  rosuvastatin (CRESTOR) 20 MG tablet Take 20 mg by mouth daily.    [provider]  sucralfate (CARAFATE) 1 g tablet Take 1 g by mouth 2 (two) times daily.    [provider]  triamcinolone cream (KENALOG) 0.1 % Apply 1 application topically 2 (two) times daily as needed. 11/16/16   Little, Traci M, PA-C  venlafaxine XR (EFFEXOR-XR) 75 MG 24 hr capsule Take 1 capsule (75 mg total) by mouth daily with breakfast. 11/09/17   McNew, Tyson Babinski, MD     Allergies Trazodone and nefazodone; Viread [tenofovir disoproxil]; and Etodolac   Family History  Problem Relation Age of Onset  . Heart attack Mother   . Diabetes Mellitus II Mother   . Mental illness Mother   . CAD Mother   . Heart attack Father   . CAD Father   . Hypertension Father     Social History Social History   Tobacco Use  . Smoking status: Never Smoker  . Smokeless tobacco: Never Used  Substance Use Topics  . Alcohol use: No  . Drug use: No    Review of Systems  Constitutional:   No fever or  chills.  ENT:   No sore throat. No rhinorrhea. Cardiovascular:   No chest pain or syncope. Respiratory:   No dyspnea or cough. Gastrointestinal:   Negative for abdominal pain, vomiting and diarrhea.  Musculoskeletal:   Negative for focal pain or swelling All other systems reviewed and are negative except as documented above in ROS and HPI.  ____________________________________________   PHYSICAL EXAM:  VITAL SIGNS: ED Triage Vitals [03/23/18 1839]  Enc Vitals Group     BP (!) 66/36     Pulse Rate 92     Resp 16     Temp 98.2 F (36.8 C)     Temp src      SpO2 93 %     Weight 221 lb (100.2 kg)  Height 5\' 7"  (1.702 m)     Head Circumference      Peak Flow      Pain Score 3     Pain Loc      Pain Edu?      Excl. in [place]?     Vital signs reviewed, nursing assessments reviewed.   Constitutional:   Alert and oriented. Non-toxic appearance. Eyes:   Conjunctivae are normal. EOMI. PERRL. ENT      Head:   Normocephalic and atraumatic.      Nose:   No congestion/rhinnorhea.       Mouth/Throat:   Dry mucous membranes, no pharyngeal erythema. No peritonsillar mass.       Neck:   No meningismus. Full ROM. Hematological/Lymphatic/Immunilogical:   No cervical lymphadenopathy. Cardiovascular:   RRR. Symmetric bilateral radial and DP pulses.  No murmurs. Cap refill less than 2 seconds. Respiratory:   Normal respiratory effort without tachypnea/retractions. Breath sounds are clear and equal bilaterally. No wheezes/rales/rhonchi. Gastrointestinal:   Soft and nontender. Non distended. There is no CVA tenderness.  No rebound, rigidity, or guarding. Musculoskeletal:   Normal range of motion in all extremities. No joint effusions.  No lower extremity tenderness.  No edema. Neurologic:   Normal speech and language.  Motor grossly intact. No acute focal neurologic deficits are appreciated.  Skin:    Skin is warm, dry and intact. No rash noted.  No petechiae, purpura, or  bullae.  ____________________________________________    LABS (pertinent positives/negatives) (all labs ordered are listed, but only abnormal results are displayed) Labs Reviewed  BASIC METABOLIC PANEL - Abnormal; Notable for the following components:      Result Value   CO2 21 (*)    Glucose, Bld 163 (*)    BUN 41 (*)    Creatinine, Ser 4.52 (*)    Calcium 8.3 (*)    GFR calc non Af Amer 13 (*)    GFR calc Af Amer 15 (*)    All other components within normal limits  CBC - Abnormal; Notable for the following components:   WBC 11.3 (*)    RBC 3.51 (*)    Hemoglobin 11.2 (*)    HCT 32.8 (*)    RDW 14.7 (*)    All other components within normal limits  URINALYSIS, COMPLETE (UACMP) WITH MICROSCOPIC  HEPATIC FUNCTION PANEL  LIPASE, BLOOD  MAGNESIUM  TROPONIN I   ____________________________________________   EKG  Interpreted by me Sinus rhythm rate of 85, normal axis and intervals.  Normal QRS ST segments and T waves.  Diffuse PR depression suggestive of pericarditis  ____________________________________________    RADIOLOGY  No results found.  ____________________________________________   PROCEDURES .Critical Care Performed by: Carrie Mew, MD Authorized by: Carrie Mew, MD   Critical care provider statement:    Critical care time (minutes):  30   Critical care time was exclusive of:  Separately billable procedures and treating other patients   Critical care was necessary to treat or prevent imminent or life-threatening deterioration of the following conditions:  Circulatory failure, dehydration and shock   Critical care was time spent personally by me on the following activities:  Development of treatment plan with patient or surrogate, discussions with consultants, evaluation of patient's response to treatment, examination of patient, obtaining history from patient or surrogate, ordering and performing treatments and interventions, ordering and  review of laboratory studies, ordering and review of radiographic studies, pulse oximetry, re-evaluation of patient's condition and review of old charts  ____________________________________________    CLINICAL IMPRESSION / ASSESSMENT AND PLAN / ED COURSE  Pertinent labs & imaging results that were available during my care of the patient were reviewed by me and considered in my medical decision making (see chart for details).    Patient presents with generalized weakness, found to have acute renal failure with a creatinine of 4.5 compared to a baseline normal creatinine for this patient.  Blood counts are unremarkable.  He does have hypotension and orthostatic symptoms.  2 L IV saline bolus ordered in the ED.  EKG to me is not consistent with a STEMI.  He has no chest pain shortness of breath or other symptoms to suggest ACS.  I will check troponin with his other labs and plan for admission.  Clinical Course as of Mar 24 2031  Tue Mar 23, 2018  2030 EKG and repeat EKG reviewed with cardiology who advises that for lack of a alternate diagnosis would proceed with STEMI evaluation.  Code STEMI activated through [place].  Follow-up with Dr. Geronimo Running.   [PS]    Clinical Course User Index [PS] Carrie Mew, MD     ____________________________________________   FINAL CLINICAL IMPRESSION(S) / ED DIAGNOSES    Final diagnoses:  Acute renal failure, unspecified acute renal failure type ([place])  Dehydration  Hypotension, unspecified hypotension type  Generalized weakness     ED Discharge Orders    None      Portions of this note were generated with dragon dictation software. Dictation errors may occur despite best attempts at proofreading.    Carrie Mew, MD 03/23/18 Glorianne Manchester    Carrie Mew, MD 03/23/18 2032

## 2018-03-23 NOTE — Progress Notes (Signed)
ANTICOAGULATION CONSULT NOTE - Initial Consult  Pharmacy Consult for Heparin  Indication: chest pain/ACS  Allergies  Allergen Reactions  . Trazodone And Nefazodone   . Viread [Tenofovir Disoproxil]     Damages kidneys  . Etodolac Rash    Patient Measurements: Height: 5\' 7"  (170.2 cm) Weight: 221 lb (100.2 kg) IBW/kg (Calculated) : 66.1 Heparin Dosing Weight:  87.9 kg   Vital Signs: Temp: 98.2 F (36.8 C) (09/24 1839) BP: 81/55 (09/24 2040) Pulse Rate: 80 (09/24 2040)  Labs: Recent Labs    03/23/18 1847  HGB 11.2*  HCT 32.8*  PLT 164  CREATININE 4.52*  TROPONINI 0.03*    Estimated Creatinine Clearance: 19.8 mL/min (A) (by C-G formula based on SCr of 4.52 mg/dL (H)).   Medical History: Past Medical History:  Diagnosis Date  . Anemia   . Anxiety   . Arthritis   . CHF (congestive heart failure) ([place])   . Chronic kidney disease    Renal Insufficiency Syndrome; Glomerulosclerosis 2013  . Complication of anesthesia   . Depressed   . Diabetes mellitus without complication ([place])   . High cholesterol   . HIV (human immunodeficiency virus infection) ([place])   . Hypertension   . 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 Paranoid disorder ([place])   . Schizophrenia, paranoid ([place])   . Sleep apnea     Medications:   (Not in a hospital admission)  Assessment: Pharmacy consulted to dose heparin in this 57 year old male admitted with ACS/NSTEMI .  No prior anticoag noted. CrCl = 19.8 ml/min  Goal of Therapy:  Heparin level 0.3-0.7 units/ml Monitor platelets by anticoagulation protocol: Yes   Plan:  Give 4000 units bolus x 1 Start heparin infusion at 1200 units/hr Check anti-Xa level in 8 hours and daily while on heparin Continue to monitor H&H and platelets  Noelie Renfrow D 03/23/2018,8:47 PM

## 2018-03-23 NOTE — H&P (Signed)
[facility] NAME: Danny Cook    MR#:  [id number]  DATE OF BIRTH:  07/07/1960  DATE OF ADMISSION:  03/23/2018  PRIMARY CARE PHYSICIAN: Katheren Shams   REQUESTING/REFERRING PHYSICIAN: Joni Fears, MD  CHIEF COMPLAINT:   Chief Complaint  Patient presents with  . Dehydration    HISTORY OF PRESENT ILLNESS:  Danny Cook  is a 57 y.o. male who presents with chief complaint as above.  States that for the last 3 to 4 days he has had headaches, migraines, and has had associated nausea with some vomiting.  He has had accompanying malaise and fatigue.  He went to see his primary care physician today due to the same, and was told he had a low blood pressure and encouraged to come to the emergency department.  Patient did not want to come to the ED.  His PCP performed some labs, and called him back at home to let them know that he needed to come to the emergency department because he was in renal failure.  Patient came to the ED and here was found initially to have ST elevations on his EKG in his inferior leads concerning for possible STEMI, although the patient has not had any chest symptoms whatsoever.  Cardiology came to see the patient in the ED and felt that this is more likely due to pericarditis, especially given bedside echo performed by cardiology which showed pericardial effusion.  Hospitalist were called for admission  PAST MEDICAL HISTORY:   Past Medical History:  Diagnosis Date  . Anemia   . Anxiety   . Arthritis   . CHF (congestive heart failure) ([place])   . Chronic kidney disease    Renal Insufficiency Syndrome; Glomerulosclerosis 2013  . Complication of anesthesia   . Depressed   . Diabetes mellitus without complication ([place])   . High cholesterol   . HIV (human immunodeficiency virus infection) ([place])   . Hypertension   . 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 Paranoid disorder ([place])   . Schizophrenia, paranoid ([place])    . Sleep apnea      PAST SURGICAL HISTORY:   Past Surgical History:  Procedure Laterality Date  . COLONOSCOPY WITH PROPOFOL N/A 09/03/2015   Procedure: COLONOSCOPY WITH PROPOFOL;  Surgeon: Lollie Sails, MD;  Location[facility] ENDOSCOPY;  Service: Endoscopy;  Laterality: N/A;  . DG TEETH FULL    . ESOPHAGOGASTRODUODENOSCOPY (EGD) WITH PROPOFOL N/A 07/01/2016   Procedure: ESOPHAGOGASTRODUODENOSCOPY (EGD) WITH PROPOFOL;  Surgeon: Lollie Sails, MD;  Location: [facility] ENDOSCOPY;  Service: Endoscopy;  Laterality: N/A;     SOCIAL HISTORY:   Social History   Tobacco Use  . Smoking status: Never Smoker  . Smokeless tobacco: Never Used  Substance Use Topics  . Alcohol use: No     FAMILY HISTORY:   Family History  Problem Relation Age of Onset  . Heart attack Mother   . Diabetes Mellitus II Mother   . Mental illness Mother   . CAD Mother   . Heart attack Father   . CAD Father   . Hypertension Father      DRUG ALLERGIES:   Allergies  Allergen Reactions  . Trazodone And Nefazodone   . Viread [Tenofovir Disoproxil]     Damages kidneys  . Etodolac Rash    MEDICATIONS AT HOME:   Prior to Admission medications   Medication Sig Start Date End Date Taking? Authorizing Provider  Abacavir-Dolutegravir-Lamivud (TRIUMEQ) 600-50-300 MG TABS Take  1 tablet by mouth daily.   Yes [provider]  aspirin 325 MG tablet Take 325 mg by mouth daily.   Yes [provider]  butalbital-acetaminophen-caffeine (FIORICET, ESGIC) 50-325-40 MG tablet Take 1-2 tablets by mouth every 6 (six) hours as needed. 01/31/18 01/31/19 Yes Earleen Newport, MD  clonazePAM (KLONOPIN) 1 MG tablet Take 0.5-1 mg by mouth 3 (three) times daily. Take 1 mg in the morning, 0.5 mg mid-day and 1 mg in the evening.   Yes [provider]  cyanocobalamin 1000 MCG tablet Take 1,000 mcg by mouth daily.   Yes [provider]  diphenhydrAMINE (BENADRYL) 50 MG capsule Take 1 capsule (50  mg total) by mouth at bedtime as needed for sleep. 11/09/17  Yes McNew, Tyson Babinski, MD  divalproex (DEPAKOTE ER) 250 MG 24 hr tablet Take 500 mg by mouth 2 (two) times daily.    Yes [provider]  FANAPT 12 MG TABS Take 12 mg by mouth 2 (two) times daily.  11/14/16  Yes [provider]  furosemide (LASIX) 20 MG tablet Take 10 mg by mouth daily.   Yes [provider]  gabapentin (NEURONTIN) 300 MG capsule Take 300 mg by mouth 3 (three) times daily. 08/21/16  Yes [provider]  gemfibrozil (LOPID) 600 MG tablet Take 1 tablet by mouth 2 (two) times daily. 11/12/16  Yes [provider]  Insulin Degludec (TRESIBA FLEXTOUCH [place]) Inject 94 Units into the skin daily.   Yes [provider]  liraglutide (VICTOZA) 18 MG/3ML SOPN Inject 1.8 mg into the skin daily.   Yes [provider]  lisinopril (PRINIVIL,ZESTRIL) 20 MG tablet Take 20 mg by mouth daily.   Yes [provider]  metFORMIN (GLUCOPHAGE) 1000 MG tablet Take 1,000 mg by mouth 2 (two) times daily with a meal.   Yes [provider]  mirtazapine (REMERON) 30 MG tablet Take 30 mg by mouth at bedtime.   Yes [provider]  omeprazole (PRILOSEC) 40 MG capsule Take 40 mg by mouth 2 (two) times daily.   Yes [provider]  promethazine (PHENERGAN) 6.25 MG/5ML syrup Take 6.25 mg by mouth every 6 (six) hours as needed for nausea or vomiting.   Yes [provider]  rosuvastatin (CRESTOR) 20 MG tablet Take 20 mg by mouth daily.   Yes [provider]  sucralfate (CARAFATE) 1 g tablet Take 1 g by mouth 2 (two) times daily.   Yes [provider]  triamcinolone cream (KENALOG) 0.1 % Apply 1 application topically 2 (two) times daily as needed. 11/16/16  Yes Little, Traci M, PA-C  venlafaxine XR (EFFEXOR-XR) 75 MG 24 hr capsule Take 1 capsule (75 mg total) by mouth daily with breakfast. 11/09/17  Yes McNew, Tyson Babinski, MD  Insulin Degludec 200  UNIT/ML SOPN Inject 20 Units into the skin daily. Patient not taking: Reported on 01/30/2018 08/01/17   Loletha Grayer, MD  meclizine (ANTIVERT) 25 MG tablet Take 1 tablet (25 mg total) by mouth 3 (three) times daily as needed for dizziness or nausea. Patient not taking: Reported on 03/23/2018 04/12/16   Carrie Mew, MD  ondansetron (ZOFRAN ODT) 4 MG disintegrating tablet Take 1 tablet (4 mg total) by mouth every 8 (eight) hours as needed for nausea or vomiting. Patient not taking: Reported on 03/23/2018 04/12/16   Carrie Mew, MD    REVIEW OF SYSTEMS:  Review of Systems  Constitutional: Positive for malaise/fatigue. Negative for chills, fever and weight loss.  HENT:  Negative for ear pain, hearing loss and tinnitus.   Eyes: Negative for blurred vision, double vision, pain and redness.  Respiratory: Negative for cough, hemoptysis and shortness of breath.   Cardiovascular: Negative for chest pain, palpitations, orthopnea and leg swelling.  Gastrointestinal: Negative for abdominal pain, constipation, diarrhea, nausea and vomiting.  Genitourinary: Negative for dysuria, frequency and hematuria.  Musculoskeletal: Negative for back pain, joint pain and neck pain.  Skin:       No acne, rash, or lesions  Neurological: Positive for weakness and headaches. Negative for dizziness, tremors and focal weakness.  Endo/Heme/Allergies: Negative for polydipsia. Does not bruise/bleed easily.  Psychiatric/Behavioral: Negative for depression. The patient is not nervous/anxious and does not have insomnia.      VITAL SIGNS:   Vitals:   03/23/18 2036 03/23/18 2040 03/23/18 2100 03/23/18 2110  BP: (!) 74/53 (!) 81/55 (!) 78/54 (!) 77/49  Pulse: 75 80 80 79  Resp: 20 (!) 21 19 19   Temp:      SpO2: 96% 93% 96% 95%  Weight:      Height:       Wt Readings from Last 3 Encounters:  03/23/18 100.2 kg  02/27/18 95.3 kg  02/22/18 99.8 kg    PHYSICAL EXAMINATION:  Physical Exam  Vitals  reviewed. Constitutional: He is oriented to person, place, and time. He appears well-developed and well-nourished. No distress.  HENT:  Head: Normocephalic and atraumatic.  Mouth/Throat: Oropharynx is clear and moist.  Eyes: Pupils are equal, round, and reactive to light. Conjunctivae and EOM are normal. No scleral icterus.  Neck: Normal range of motion. Neck supple. No JVD present. No thyromegaly present.  Cardiovascular: Normal rate, regular rhythm and intact distal pulses. Exam reveals no gallop and no friction rub.  No murmur heard. Distant heart sounds, no appreciable rub  Respiratory: Effort normal and breath sounds normal. No respiratory distress. He has no wheezes. He has no rales.  GI: Soft. Bowel sounds are normal. He exhibits no distension. There is no tenderness.  Musculoskeletal: Normal range of motion. He exhibits no edema.  No arthritis, no gout  Lymphadenopathy:    He has no cervical adenopathy.  Neurological: He is alert and oriented to person, place, and time. No cranial nerve deficit.  No dysarthria, no aphasia  Skin: Skin is warm and dry. No rash noted. No erythema.  Psychiatric: He has a normal mood and affect. His behavior is normal. Judgment and thought content normal.    LABORATORY PANEL:   CBC Recent Labs  Lab 03/23/18 1847  WBC 11.3*  HGB 11.2*  HCT 32.8*  PLT 164   ------------------------------------------------------------------------------------------------------------------  Chemistries  Recent Labs  Lab 03/23/18 1847  NA 136  K 4.3  CL 101  CO2 21*  GLUCOSE 163*  BUN 41*  CREATININE 4.52*  CALCIUM 8.3*  MG 1.9  AST 22  ALT 13  ALKPHOS 73  BILITOT 0.6   ------------------------------------------------------------------------------------------------------------------  Cardiac Enzymes Recent Labs  Lab 03/23/18 1847  TROPONINI 0.03*    ------------------------------------------------------------------------------------------------------------------  RADIOLOGY:  No results found.  EKG:   Orders placed or performed during the hospital encounter of 03/23/18  . ED EKG  . ED EKG  . EKG 12-Lead  . EKG 12-Lead  . EKG 12-Lead  . EKG 12-Lead  . Repeat EKG  . Repeat EKG  . EKG 12-Lead  . EKG 12-Lead    IMPRESSION AND PLAN:  Principal Problem:   Pericarditis with effusion -consult cardiology, formal echocardiogram ordered, per initial recommendations  by cardiology I have ordered some preliminary autoimmune labs.  Given his renal failure we will avoid colchicine or NSAIDs, and due to the pericardial effusion we will not give any anticoagulation at this time unless his troponin rises significantly, his EKG changes significantly, or he develops chest symptoms. Active Problems:   Chronic diastolic heart failure (HCC) -cautious resuscitation with IV fluids to improve his blood pressure, continue home meds, other work-up as above   AKI (acute kidney injury) ([place]) -profound renal failure, unclear absolute etiology.  Patient had some labs in our system around a month ago which showed a creatinine of around 2, labs prior to that this year had shown a normal creatinine.  His creatinine today is greater than 4.  Nephrology consult, IV fluids tonight, avoid nephrotoxins   HTN (hypertension) -patient has been hypotensive in the ED, hold antihypertensives, IV fluids initially for blood pressure support as above   Diabetes (HCC) -sliding scale insulin with corresponding glucose checks   HIV positive (HCC) -checking HIV labs including CD4 count   Schizoaffective disorder, depressive type ([place]) -continue home meds  Chart review performed and case discussed with ED provider. Labs, imaging and/or ECG reviewed by provider and discussed with patient/family. Management plans discussed with the patient and/or family.  DVT PROPHYLAXIS: SubQ  lovenox   GI PROPHYLAXIS:  PPI   ADMISSION STATUS: Inpatient     CODE STATUS: Full Code Status History    Date Active Date Inactive Code Status Order ID Comments User Context   11/04/2017 2050 11/09/2017 1400 Full Code [id number]  Gonzella Lex, MD Inpatient   07/31/2017 0240 08/01/2017 1952 Full Code [id number]  [name], MD ED      TOTAL TIME TAKING CARE OF THIS PATIENT: 45 minutes.   Jatniel Verastegui [place] 03/23/2018, 9:21 PM  [employer]  [phone]  CC: Primary care physician; Katheren Shams  Note:  This document was prepared using Dragon voice recognition software and may include unintentional dictation errors.

## 2018-03-23 NOTE — ED Notes (Addendum)
Cardiology MD at bedside. 2051 Cardiology MD at bedside with [place] performing echo 2100 per End MD hold on cath lab at this time. Pt aware.

## 2018-03-23 NOTE — ED Triage Notes (Signed)
PT to ED after PCP called and told him to come to the ED due to dehydration. Pt reports he has been drinking more sodas then water but has also been feeling weak, tired, and dizzy x 3 days with intermittent migraines.

## 2018-03-23 NOTE — Consult Note (Signed)
Cardiology Consultation:   Patient ID: Danny Cook. MRN: [id number]; DOB: [birth year]  Admit date: 03/23/2018 Date of Consult: 03/23/2018  Primary Care Provider: Katheren Shams Primary Cardiologist: Jordan Hawks, MD Primary Electrophysiologist:  None    Patient Profile:   Danny Cook. is a 57 y.o. 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 Joni Fears.  History of Present Illness:   Danny Cook reports several days of generalized weakness, malaise, and nausea/vomiting.  He has not been unable to keep anything down for the last few days.  Today, he was profoundly weak and even unable to stand on his own.  He notes a persistent migraine headache for the last 3 days, to which she attributes much of his nausea and vomiting.  He was seen by his PCP, Dr. Ginette Pitman, today, at which time labs were drawn.  Basic metabolic panel returned with a creatinine of 3.7, prompting the patient to be referred to the emergency department for further evaluation.  At this time, Danny Cook continues to have generalized fatigue.  He has not had any chest pain, shortness of breath, palpitations, orthopnea, or edema.  He was evaluated a month ago by Dr. Ubaldo Glassing of [facility] clinic cardiology due to an episode of chest pain.  Stress test is currently pending.  The patient denies a personal history of cardiac disease.  Echocardiogram in February showed normal LVEF without valvular abnormalities.  Past Medical History:  Diagnosis Date  . Anemia   . Anxiety   . Arthritis   . CHF (congestive heart failure) ([place])   . Chronic kidney disease    Renal Insufficiency Syndrome; Glomerulosclerosis 2013  . Complication of anesthesia   . Depressed   . Diabetes mellitus  without complication ([place])   . High cholesterol   . HIV (human immunodeficiency virus infection) ([place])   . Hypertension   . 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[place])   . Paranoid disorder ([place])   . Schizophrenia, paranoid ([place])   . Sleep apnea     Past Surgical History:  Procedure Laterality Date  . COLONOSCOPY WITH PROPOFOL N/A 09/03/2015   Procedure: COLONOSCOPY WITH PROPOFOL;  Surgeon: Lollie Sails, MD;  Location: [facility] ENDOSCOPY;  Service: Endoscopy;  Laterality: N/A;  . DG TEETH FULL    . ESOPHAGOGASTRODUODENOSCOPY (EGD) WITH PROPOFOL N/A 07/01/2016   Procedure: ESOPHAGOGASTRODUODENOSCOPY (EGD) WITH PROPOFOL;  Surgeon: Lollie Sails, MD;  Location: [facility] ENDOSCOPY;  Service: Endoscopy;  Laterality: N/A;     Home Medications:  Prior to Admission medications   Medication Sig Start Date Arkel Cartwright Date Taking? Authorizing Provider  Abacavir-Dolutegravir-Lamivud (TRIUMEQ) 600-50-300 MG TABS Take 1 tablet by mouth daily.   Yes [provider]  aspirin 325 MG tablet Take 325 mg by mouth daily.   Yes [provider]  butalbital-acetaminophen-caffeine (FIORICET, ESGIC) 50-325-40 MG tablet Take 1-2 tablets by mouth every 6 (six) hours as needed. 01/31/18 01/31/19 Yes Earleen Newport, MD  clonazePAM (KLONOPIN) 1 MG tablet Take 0.5-1 mg by mouth 3 (three) times daily. Take 1 mg in the morning, 0.5 mg mid-day and 1 mg in the evening.   Yes [provider]  cyanocobalamin 1000 MCG tablet Take 1,000 mcg by mouth daily.   Yes [provider]  diphenhydrAMINE (BENADRYL) 50 MG capsule Take 1 capsule (50  mg total) by mouth at bedtime as needed for sleep. 11/09/17  Yes McNew, Tyson Babinski, MD  divalproex (DEPAKOTE ER) 250 MG 24 hr tablet Take 500 mg by mouth 2 (two) times daily.    Yes [provider]  FANAPT 12 MG TABS Take 12 mg by mouth 2 (two) times daily.  11/14/16  Yes [provider]  furosemide (LASIX) 20 MG tablet Take 10 mg by mouth daily.   Yes [provider]  gabapentin (NEURONTIN) 300 MG capsule Take 300 mg by mouth 3 (three) times daily. 08/21/16  Yes [provider]  gemfibrozil (LOPID) 600 MG tablet Take 1 tablet by mouth 2 (two) times daily. 11/12/16  Yes [provider]  Insulin Degludec (TRESIBA FLEXTOUCH [place]) Inject 94 Units into the skin daily.   Yes [provider]  liraglutide (VICTOZA) 18 MG/3ML SOPN Inject 1.8 mg into the skin daily.   Yes [provider]  lisinopril (PRINIVIL,ZESTRIL) 20 MG tablet Take 20 mg by mouth daily.   Yes [provider]  metFORMIN (GLUCOPHAGE) 1000 MG tablet Take 1,000 mg by mouth 2 (two) times daily with a meal.   Yes [provider]  mirtazapine (REMERON) 30 MG tablet Take 30 mg by mouth at bedtime.   Yes [provider]  omeprazole (PRILOSEC) 40 MG capsule Take 40 mg by mouth 2 (two) times daily.   Yes [provider]  promethazine (PHENERGAN) 6.25 MG/5ML syrup Take 6.25 mg by mouth every 6 (six) hours as needed for nausea or vomiting.   Yes [provider]  rosuvastatin (CRESTOR) 20 MG tablet Take 20 mg by mouth daily.   Yes [provider]  sucralfate (CARAFATE) 1 g tablet Take 1 g by mouth 2 (two) times daily.   Yes [provider]  triamcinolone cream (KENALOG) 0.1 % Apply 1 application topically 2 (two) times daily as needed. 11/16/16  Yes Little, Traci M, PA-C  venlafaxine XR (EFFEXOR-XR) 75 MG 24 hr capsule Take 1 capsule (75 mg total) by mouth daily with breakfast. 11/09/17  Yes McNew, Tyson Babinski, MD  Insulin Degludec 200 UNIT/ML SOPN Inject 20 Units into the skin daily. Patient not taking: Reported on 01/30/2018 08/01/17   Loletha Grayer, MD  meclizine (ANTIVERT) 25 MG tablet Take 1 tablet (25 mg total) by mouth 3 (three) times daily as needed for dizziness or nausea. Patient not taking: Reported on 03/23/2018 04/12/16   Carrie Mew, MD  ondansetron (ZOFRAN ODT) 4 MG disintegrating tablet Take  1 tablet (4 mg total) by mouth every 8 (eight) hours as needed for nausea or vomiting. Patient not taking: Reported on 03/23/2018 04/12/16   Carrie Mew, MD    Inpatient Medications: Scheduled Meds:  Continuous Infusions:  PRN Meds:   Allergies:    Allergies  Allergen Reactions  . Trazodone And Nefazodone   . Viread [Tenofovir Disoproxil]     Damages kidneys  . Etodolac Rash    Social History:   Social History   Socioeconomic History  . Marital status: Single    Spouse name: Not on file  . Number of children: Not on file  . Years of education: 35  . Highest education level: GED or equivalent  Occupational History  . Not on file  Social Needs  . Financial resource strain: Not hard at all  . Food insecurity:    Worry: Sometimes true    Inability: Never true  . Transportation needs:    Medical: No  Non-medical: No  Tobacco Use  . Smoking status: Never Smoker  . Smokeless tobacco: Never Used  Substance and Sexual Activity  . Alcohol use: No  . Drug use: No  . Sexual activity: Never  Lifestyle  . Physical activity:    Days per week: 0 days    Minutes per session: 0 min  . Stress: To some extent  Relationships  . Social connections:    Talks on phone: More than three times a week    Gets together: Three times a week    Attends religious service: Never    Active member of club or organization: No    Attends meetings of clubs or organizations: Never    Relationship status: Never married  . Intimate partner violence:    Fear of current or ex partner: Patient refused    Emotionally abused: Patient refused    Physically abused: Patient refused    Forced sexual activity: Patient refused  Other Topics Concern  . Not on file  Social History Narrative  . Not on file    Family History:   Family History  Problem Relation Age of Onset  . Heart attack Mother   . Diabetes Mellitus II Mother   . Mental illness Mother   . CAD Mother   . Heart attack  Father   . CAD Father   . Hypertension Father      ROS:  Please see the history of present illness. All other ROS reviewed and negative.     Physical Exam/Data:   Vitals:   03/23/18 2000 03/23/18 2036 03/23/18 2040 03/23/18 2100  BP: (!) 78/57 (!) 74/53 (!) 81/55 (!) 78/54  Pulse: 75 75 80 80  Resp: 19 20 (!) 21 19  Temp:      SpO2: 95% 96% 93% 96%  Weight:      Height:        Intake/Output Summary (Last 24 hours) at 03/23/2018 2109 Last data filed at 03/23/2018 2044 Gross per 24 hour  Intake 1999.99 ml  Output -  Net 1999.99 ml   Filed Weights   03/23/18 1839  Weight: 100.2 kg   Body mass index is 34.61 kg/m.  General: Ill-appearing man, lying in bed. HEENT: normal Lymph: no adenopathy Neck: no JVD Endocrine:  No thryomegaly Vascular: No carotid bruits; FA pulses 2+ bilaterally without bruits  Cardiac: Distant heart sounds.  Regular rate and rhythm without murmurs. Lungs:  clear to auscultation bilaterally, no wheezing, rhonchi or rales  Abd: soft, nontender, no hepatomegaly  Ext: no edema Musculoskeletal:  No deformities, BUE and BLE strength normal and equal Skin: warm and mildly diaphoretic. Neuro:  CNs 2-12 intact, no focal abnormalities noted Psych:  Normal affect   EKG:  The EKG was personally reviewed and demonstrates: Normal sinus rhythm with ST elevation in the inferior and anterolateral leads.  There is also PR depression in the inferior leads as well as PR elevation in aVR.  Relevant CV Studies: Limited bedside echocardiogram performed by myself today demonstrates normal LVEF without wall motion abnormality.  If anything, the LV appears somewhat underfilled.  There is a small to moderate pericardial effusion without evidence of tamponade.  IVC is normal size with decreased respiratory variation suggestive of mildly elevated central venous pressure.  Laboratory Data:  Chemistry Recent Labs  Lab 03/23/18 1847  NA 136  K 4.3  CL 101  CO2 21*    GLUCOSE 163*  BUN 41*  CREATININE 4.52*  CALCIUM 8.3*  GFRNONAA  13*  GFRAA 15*  ANIONGAP 14    Recent Labs  Lab 03/23/18 1847  PROT 7.5  ALBUMIN 3.5  AST 22  ALT 13  ALKPHOS 73  BILITOT 0.6   Hematology Recent Labs  Lab 03/23/18 1847  WBC 11.3*  RBC 3.51*  HGB 11.2*  HCT 32.8*  MCV 93.4  MCH 31.9  MCHC 34.2  RDW 14.7*  PLT 164   Cardiac Enzymes Recent Labs  Lab 03/23/18 1847  TROPONINI 0.03*   No results for input(s): TROPIPOC in the last 168 hours.  BNPNo results for input(s): BNP, PROBNP in the last 168 hours.  DDimer No results for input(s): DDIMER in the last 168 hours.  Radiology/Studies:  No results found.  Assessment and Plan:   Abnormal EKG with suspected pericarditis EKG findings certainly are concerning for ischemia.  However, given lack of chest pain, distribution of ST elevation, subtle PR depression (elevation in aVR), and normal LVEF by bedside echo without wall motion abnormality, I am hesitant to proceed with emergent cardiac catheterization as I do not believe this represents an acute myocardial infarction.  Pericarditis is a greater concern.  In the setting of acute kidney injury, certainly uremic pericarditis is a possibility.  Patient also is at risk for infectious etiologies with his history of HIV as well as idiopathic/inflammatory pericarditis.  Continue IV hydration and close monitoring in stepdown/ICU.  Trend troponin.  Unless patient has marked rise in troponin, onset of anginal chest pain, or worsening EKG changes, I would recommend against heparinization or emergent cardiac catheterization in the setting of possible pericarditis and acute on chronic kidney disease.  Avoid NSAIDs and colchicine in the setting of acute on chronic kidney disease.  Consider autoimmune panel to work-up potential inflammatory causes of pericarditis.  Acute on chronic kidney disease Evidence of acute kidney injury superimposed on baseline chronic kidney  disease stage II-III (creatinine 1.2 on 02/17/2018 but up to 2.9 on 02/27/2018).  Continue gentle hydration.  Avoid nephrotoxic drugs.  Recommend nephrology consultation.  Hypotension Likely multifactorial.  Possible considerations include septic and hypovolemic shock.  Given preserved LVEF, cardiogenic shock seems less likely.  RV dysfunction is a possibility, though RV was grossly normal and function on limited bedside echo.  Obtain formal echocardiogram.  Further work-up for potential infectious etiologies per internal medicine.  If patient has refractory hypotension, consider vasopressor support.  Echo findings and the lack of tachycardia argue against tamponade physiology.  Consider pulses paradoxus evaluation.  Check lactic acid level, given hypotension and history of metformin use in the setting of worsening renal insufficiency.   Disposition Patient follows with Dr. Ubaldo Glassing.  Recommend consultation with [facility] Cardiology for further recommendations.  For questions or updates, please contact [place] Please consult [email or web address] for contact info under [facility] cardiology.  Signed, Nelva Bush, MD  03/23/2018 9:09 PM

## 2018-03-23 NOTE — ED Notes (Signed)
[place] MD at bedside 

## 2018-03-23 NOTE — Consult Note (Addendum)
PULMONARY / CRITICAL CARE MEDICINE   Name: Danny Cook. MRN: [id number] DOB: 01-29-1961    ADMISSION DATE:  03/23/2018 CONSULTATION DATE:  03/23/2018  REFERRING MD:  Dr. Jannifer Franklin  CHIEF COMPLAINT:  Generalized weakness and poor PO intake  DISCUSSION: 57 y.o.  Male with Hx of HIV, admitted with Pericarditis with a small to moderate pericardial effusion without evidence of tamponade on bedside Echo, and AKI on CKD.  HISTORY OF PRESENT ILLNESS:   Danny Cook is a 57 y.o. Male with a PMH as listed below who presents to [facility] ED on 03/23/18 with c/o generalized weakness and poor po intake for approximately 3 to 4 days.  He reports that he was at his PCP today who checked labs and referred him to the ED for fluids due to dehydration and AKI.  Pt denies chest pain, shortness of breath, or fever/chills.  Initial workup in the ED revealed Creatinine 4.52 (baseline Cr 1.2), serum CO2 21, Anion gap 14, and glucose 163.  Pt was noted to have hypotension and orthostatic symptoms.  EKG was concerning for ST elevation, but clinically he denied chest pain, SOB, or other symptoms suggestive of ACS. Troponin was 0.03.  Cardiology was consulted and saw pt in ED, of which a bedside Ultrasound was performed.  Bedside Ultrasound was concerning for Pericarditis with a small to moderate pericardial effusion without evidence of tamponade.  He is admitted to [facility] ICU for treatment of Pericarditis and AKI on CKD.  PCCM is consulted for further management.   Significant event: 03/24/2018 admission, central line attempt  Culture: Blood> Sputum> Urine>  Antibiotics: Vanco plus cefepime 9/2 5  Subjective: Overnight patient went into A. fib with RVR, started on amiodarone drip after discussing the case with cardiology, patient's blood pressure remained low and borderline, received IV fluid boluses and started on phenylephrine, central line was attempted without success - Currently patient in sinus tach on  phenylephrine 160 mics amiodarone 0.5 - 4400+ - Prior investigation suggested adrenal adenoma and kidney stone  PAST MEDICAL HISTORY :  He  has a past medical history of Anemia, Anxiety, Arthritis, CHF (congestive heart failure) ([place]), Chronic kidney disease, Complication of anesthesia, Depressed, Diabetes mellitus without complication ([place]), High cholesterol, HIV (human immunodeficiency virus infection) ([place]), Hypertension, 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[place]), Paranoid disorder ([place]), Schizophrenia, paranoid ([place]), and Sleep apnea.  PAST SURGICAL HISTORY: He  has a past surgical history that includes DG TEETH FULL; Colonoscopy with propofol (N/A, 09/03/2015); and Esophagogastroduodenoscopy (egd) with propofol (N/A, 07/01/2016).  Allergies  Allergen Reactions  . Trazodone And Nefazodone   . Viread [Tenofovir Disoproxil]     Damages kidneys  . Etodolac Rash    No current facility-administered medications on file prior to encounter.    Current Outpatient Medications on File Prior to Encounter  Medication Sig  . Abacavir-Dolutegravir-Lamivud (TRIUMEQ) 600-50-300 MG TABS Take 1 tablet by mouth daily.  [name] aspirin 325 MG tablet Take 325 mg by mouth daily.  . butalbital-acetaminophen-caffeine (FIORICET, ESGIC) 50-325-40 MG tablet Take 1-2 tablets by mouth every 6 (six) hours as needed.  . clonazePAM (KLONOPIN) 1 MG tablet Take 0.5-1 mg by mouth 3 (three) times daily. Take 1 mg in the morning, 0.5 mg mid-day and 1 mg in the evening.  . cyanocobalamin 1000 MCG tablet Take 1,000 mcg by mouth daily.  . diphenhydrAMINE (BENADRYL) 50 MG capsule Take 1 capsule (50 mg total) by mouth at bedtime as needed for sleep.  . divalproex (DEPAKOTE ER) 250 MG 24 hr tablet  Take 500 mg by mouth 2 (two) times daily.   [name] FANAPT 12 MG TABS Take 12 mg by mouth 2 (two) times daily.   . furosemide (LASIX) 20 MG tablet Take 10 mg by mouth daily.  [name] gabapentin (NEURONTIN) 300 MG capsule Take 300 mg by mouth 3 (three) times daily.  [name]  gemfibrozil (LOPID) 600 MG tablet Take 1 tablet by mouth 2 (two) times daily.  . Insulin Degludec (TRESIBA FLEXTOUCH ) Inject 94 Units into the skin daily.  [name] liraglutide (VICTOZA) 18 MG/3ML SOPN Inject 1.8 mg into the skin daily.  [name] lisinopril (PRINIVIL,ZESTRIL) 20 MG tablet Take 20 mg by mouth daily.  . metFORMIN (GLUCOPHAGE) 1000 MG tablet Take 1,000 mg by mouth 2 (two) times daily with a meal.  . mirtazapine (REMERON) 30 MG tablet Take 30 mg by mouth at bedtime.  [name] omeprazole (PRILOSEC) 40 MG capsule Take 40 mg by mouth 2 (two) times daily.  . promethazine (PHENERGAN) 6.25 MG/5ML syrup Take 6.25 mg by mouth every 6 (six) hours as needed for nausea or vomiting.  . rosuvastatin (CRESTOR) 20 MG tablet Take 20 mg by mouth daily.  . sucralfate (CARAFATE) 1 g tablet Take 1 g by mouth 2 (two) times daily.  [name] triamcinolone cream (KENALOG) 0.1 % Apply 1 application topically 2 (two) times daily as needed.  . venlafaxine XR (EFFEXOR-XR) 75 MG 24 hr capsule Take 1 capsule (75 mg total) by mouth daily with breakfast.  . Insulin Degludec 200 UNIT/ML SOPN Inject 20 Units into the skin daily. (Patient not taking: Reported on 01/30/2018)  . meclizine (ANTIVERT) 25 MG tablet Take 1 tablet (25 mg total) by mouth 3 (three) times daily as needed for dizziness or nausea. (Patient not taking: Reported on 03/23/2018)  . ondansetron (ZOFRAN ODT) 4 MG disintegrating tablet Take 1 tablet (4 mg total) by mouth every 8 (eight) hours as needed for nausea or vomiting. (Patient not taking: Reported on 03/23/2018)    FAMILY HISTORY:  His He indicated that the status of his mother is unknown. He indicated that the status of his father is unknown.   SOCIAL HISTORY: He  reports that he has never smoked. He has never used smokeless tobacco. He reports that he does not drink alcohol or use drugs.  REVIEW OF SYSTEMS:   Positives in BOLD: Gen: Denies fever, chills, weight change, +fatigue, night sweats HEENT: Denies blurred  vision, double vision, hearing loss, tinnitus, sinus congestion, rhinorrhea, sore throat, neck stiffness, dysphagia PULM: Denies shortness of breath, +cough, sputum production, hemoptysis, wheezing CV: Denies chest pain, edema, orthopnea, paroxysmal nocturnal dyspnea, palpitations GI: Denies abdominal pain, nausea, vomiting, diarrhea, hematochezia, melena, +constipation, change in bowel habits GU: Denies dysuria, hematuria, polyuria, oliguria, urethral discharge Endocrine: Denies hot or cold intolerance, polyuria, polyphagia or appetite change Derm: Denies rash, dry skin, scaling or peeling skin change Heme: Denies easy bruising, bleeding, bleeding gums Neuro: Denies headache, numbness, weakness, slurred speech, loss of memory or consciousness   SUBJECTIVE:  Pt reports feeling tired and fatigued, chills Reports intermittent nonproductive dry cough Denies chest pain, SOB, palpitations, edema, weight loss or gain On room air  VITAL SIGNS: BP (!) 88/50   Pulse 82   Temp 98.3 F (36.8 C) (Oral)   Resp 20   Ht 5\' 7"  (1.702 m)   Wt 100.2 kg   SpO2 96%   BMI 34.61 kg/m   HEMODYNAMICS:    VENTILATOR SETTINGS:    INTAKE / OUTPUT: No intake/output data recorded.  PHYSICAL EXAMINATION: General:  Acutely ill appearing male, laying in bed, on room air, in NAD Neuro:  Awake, A&O x4, follows commands, no focal deficits HEENT:  Atraumatic, normocephalic, neck supple, no JVD Cardiovascular: RRR, s1s2, no M/R/G, 2+ pulses throughout Lungs:  Clear diminished bilaterally, no wheezing, even, non-labored, normal effort Abdomen:  Obese, tender, BS+ x4 Musculoskeletal:  No deformities, normal bulk and tone Skin:  Warm/dry. No obvious rashes, lesions, or ulcerations  LABS:  BMET Recent Labs  Lab 03/23/18 1847  NA 136  K 4.3  CL 101  CO2 21*  BUN 41*  CREATININE 4.52*  GLUCOSE 163*    Electrolytes Recent Labs  Lab 03/23/18 1847  CALCIUM 8.3*  MG 1.9    CBC Recent Labs   Lab 03/23/18 1847  WBC 11.3*  HGB 11.2*  HCT 32.8*  PLT 164    Coag's Recent Labs  Lab 03/23/18 2043  APTT 155*  INR 1.34    Sepsis Markers No results for input(s): LATICACIDVEN, PROCALCITON, O2SATVEN in the last 168 hours.  ABG No results for input(s): PHART, PCO2ART, PO2ART in the last 168 hours.  Liver Enzymes Recent Labs  Lab 03/23/18 1847  AST 22  ALT 13  ALKPHOS 73  BILITOT 0.6  ALBUMIN 3.5    Cardiac Enzymes Recent Labs  Lab 03/23/18 1847  TROPONINI 0.03*    Glucose No results for input(s): GLUCAP in the last 168 hours.  Imaging No results found.   STUDIES:  CXR 9/24>> stable cardiomegaly, negative for acute disease process Echocardiogram 9/25>>  CULTURES: Blood x2 03/23/18>>  ANTIBIOTICS:   SIGNIFICANT EVENTS: 03/23/2018>> Admission to [facility]   LINES/TUBES:   ASSESSMENT / PLAN:  PULMONARY A: No acute issues Hx: Sleep apnea P:   Supplemental O2 prn to maintain O2 sats >92% BiPAP prn at bedtime for sleep apnea -Discussed with the patient at length if he can bring his home sleep machine if not will use empiric BiPAP 12/8 in the hospital  CARDIOVASCULAR A:  Pericarditis Mildly elevated troponin Shock, likely hypovolemic +/- Septic vs. Obstructive (bedside ECHO not concerning for cardiac tamponade) P:  -Cardiac monitoring -Maintain MAP>65 -Received 3L NS boluses -Maintenance IVF: NS @ 100 ml/hr -Neo-synephrine if needed to maintain MAP goal -Cardiology following, appreciate input -Per Cardiology recommendations, do not recommend Heparin gtt or emergent cardiac cath at this time -Trend Troponin -Unable to treat Pericarditis with NSAIDS and Colchicine at this time due to AKI- get rheumatology consult, consider prednisone -Auto-immune panel workup to evaluate for potential inflammatory cause of Pericarditis -Obtain Blood cultures to look for infectious source of pericarditis -Echocardiogram pending -At this time,  Pericardiocentesis not indicated as bedside Echo without evidence of tamponade, continue to monitor - Currently on amiodarone for A. fib, monitor LFTs   RENAL A:   AKI on CKD (baseline Cr. 1.2) Mild Anion Gap Metabolic Acidosis P:   -Monitor I&O's / urinary output-Foley catheter -Follow BMP -Ensure adequate renal perfusion -Avoid nephrotoxic agents as able -Replace electrolytes as indicated -Gentle IVF -Trend Lactic acid -Obtain Renal Ultrasound -Nephrology consulted,    GASTROINTESTINAL A:   Constipation P:   -Discussed with cardiology if no procedure planned start diet -Monitor LFTs -Continue PO Protonix BID -Add Colace  HEMATOLOGIC A:   Anemia without signs of bleeding P:  -Monitor for s/sx of bleeding -Trend CBC -Heparin SQ for VTE Prophylaxis -Transfuse for Hgb<7  INFECTIOUS A:   Leukocytosis -CXR currently negative for consolidation at this time Hx: HIV last CD4 of 612 on  August P:   -Monitor fever curve -Trend WBC -Obtain Blood cultures -Trend Procalcitonin, started on empiric Vanco and cefepime -Consult ID,  -Continue home Triumeq  ENDOCRINE A:   Hyperglycemia Hx: DM   P:   -CBG's -SSI -Follow ICU hypo/hyperglycemia protocol  NEUROLOGIC A:   No acute issues Hx: Schizophrenia, Depression, Anxiety P:   -Provide supportive care -Lights on during the day -Promote normal wake/sleep cycle -Avoid sedating meds as able -Continue home Effexor, Iloperidone, Depakote, Clonzapam   FAMILY  - Updates: Updated pt at bedside 03/23/18.  Pt remains Full Code. - PICC line order placed as central line attempt was unsuccessful - Plan of care discussed with the nursing  Darel Hong, [facility] Pager: [phone]   03/23/2018, 10:23 PM   STAFF NOTE: I, Dr Lahoma Rocker have personally reviewed patient's available data, including medical history, events of note, physical examination and test results as part  of my evaluation. I have discussed with resident/NP and other care providers such as pharmacist, RN and RRT.  In addition,  I personally evaluated patient and elicited key findings of   -I reviewed patient's note, examined the patient, made appropriate changes in the note, reviewed labs and patient's history   Skin/Wound: Chronic changes  Electrolytes: Replace electrolytes per ICU electrolyte replacement protocol.   IVF: Normal saline at 100, potassium replaced  Nutrition: Start diet if no procedures done  Prophylaxis: DVT Prophylaxis with heparin,. GI Prophylaxis.   Restraints: None  PT/OT eval and treat. OOB when appropriate.   Lines/Tubes: Insert Foley plan PICC central line.  ADVANCE DIRECTIVE: Full code  FAMILY DISCUSSION: Discussed with the patient  Quality Care: PPI, DVT prophylaxis, HOB elevated, Infection control all reviewed and addressed.  Events and notes from last 24 hours reviewed. Care plan discussed on multidisciplinary rounds  CC TIME: 40 minutes   Old records reviewed discussed results and management plan with patient  Images personally reviewed and results and labs reviewed and discussed with patient.  All medication reviewed and adjusted  Further management depending on test results and work up as outlined above.    Lahoma Rocker, M.D

## 2018-03-23 NOTE — ED Notes (Signed)
Date and time results received: 03/23/18 1954  Test: Troponin  Critical Value: 0.03  Name of Provider Notified: Joni Fears MD  Orders Received? Or Actions Taken? Pt remains on cardiac monitor and pulse ox, will continue to monitor.

## 2018-03-23 NOTE — Progress Notes (Signed)
   03/23/18 2045  Clinical Encounter Type  Visited With Patient not available  Visit Type Initial;Spiritual support;ED  Referral From Nurse  Consult/Referral To Chaplain  Spiritual Encounters  Spiritual Needs Prayer;Other (Comment)   [place] received a Code stemi for Danny Cook. I reported to the patient's room and he was in the middle of receiving a test from the MD. As I waited to see the patient the code stemi was cancelled. I will follow up as needed.

## 2018-03-24 ENCOUNTER — Inpatient Hospital Stay: Payer: Self-pay

## 2018-03-24 ENCOUNTER — Inpatient Hospital Stay: Payer: Medicare Other

## 2018-03-24 ENCOUNTER — Inpatient Hospital Stay
Admit: 2018-03-24 | Discharge: 2018-03-24 | Disposition: A | Discharge status: Home / Self Care | Payer: Medicare Other | Attending: Cardiovascular Disease | Admitting: Cardiovascular Disease

## 2018-03-24 DIAGNOSIS — Z794 Long term (current) use of insulin: Secondary | ICD-10-CM

## 2018-03-24 DIAGNOSIS — I959 Hypotension, unspecified: Secondary | ICD-10-CM

## 2018-03-24 DIAGNOSIS — Z7982 Long term (current) use of aspirin: Secondary | ICD-10-CM

## 2018-03-24 DIAGNOSIS — R531 Weakness: Secondary | ICD-10-CM

## 2018-03-24 DIAGNOSIS — E1122 Type 2 diabetes mellitus with diabetic chronic kidney disease: Secondary | ICD-10-CM

## 2018-03-24 DIAGNOSIS — N179 Acute kidney failure, unspecified: Secondary | ICD-10-CM

## 2018-03-24 DIAGNOSIS — Z79899 Other long term (current) drug therapy: Secondary | ICD-10-CM

## 2018-03-24 DIAGNOSIS — R509 Fever, unspecified: Secondary | ICD-10-CM

## 2018-03-24 DIAGNOSIS — I313 Pericardial effusion (noninflammatory): Secondary | ICD-10-CM

## 2018-03-24 DIAGNOSIS — E860 Dehydration: Secondary | ICD-10-CM

## 2018-03-24 DIAGNOSIS — B20 Human immunodeficiency virus [HIV] disease: Secondary | ICD-10-CM

## 2018-03-24 DIAGNOSIS — R0602 Shortness of breath: Secondary | ICD-10-CM

## 2018-03-24 DIAGNOSIS — F329 Major depressive disorder, single episode, unspecified: Secondary | ICD-10-CM

## 2018-03-24 DIAGNOSIS — Z888 Allergy status to other drugs, medicaments and biological substances status: Secondary | ICD-10-CM

## 2018-03-24 DIAGNOSIS — I509 Heart failure, unspecified: Secondary | ICD-10-CM

## 2018-03-24 DIAGNOSIS — R05 Cough: Secondary | ICD-10-CM

## 2018-03-24 DIAGNOSIS — I130 Hypertensive heart and chronic kidney disease with heart failure and stage 1 through stage 4 chronic kidney disease, or unspecified chronic kidney disease: Secondary | ICD-10-CM

## 2018-03-24 DIAGNOSIS — R51 Headache: Secondary | ICD-10-CM

## 2018-03-24 DIAGNOSIS — N189 Chronic kidney disease, unspecified: Secondary | ICD-10-CM

## 2018-03-24 DIAGNOSIS — N051 Unspecified nephritic syndrome with focal and segmental glomerular lesions: Secondary | ICD-10-CM

## 2018-03-24 DIAGNOSIS — R112 Nausea with vomiting, unspecified: Secondary | ICD-10-CM

## 2018-03-24 LAB — URINALYSIS, COMPLETE (UACMP) WITH MICROSCOPIC
BILIRUBIN URINE: NEGATIVE
Bacteria, UA: NONE SEEN
KETONES UR: NEGATIVE mg/dL
LEUKOCYTES UA: NEGATIVE
NITRITE: NEGATIVE
PH: 5 (ref 5.0–8.0)
Protein, ur: 30 mg/dL — AB
SQUAMOUS EPITHELIAL / LPF: NONE SEEN (ref 0–5)
Specific Gravity, Urine: 1.013 (ref 1.005–1.030)

## 2018-03-24 LAB — BASIC METABOLIC PANEL
ANION GAP: 13 (ref 5–15)
BUN: 41 mg/dL — ABNORMAL HIGH (ref 6–20)
CHLORIDE: 105 mmol/L (ref 98–111)
CO2: 22 mmol/L (ref 22–32)
CREATININE: 3.92 mg/dL — AB (ref 0.61–1.24)
Calcium: 7.6 mg/dL — ABNORMAL LOW (ref 8.9–10.3)
GFR calc non Af Amer: 15 mL/min — ABNORMAL LOW (ref >60)
GFR, EST AFRICAN AMERICAN: 18 mL/min — AB (ref >60)
Glucose, Bld: 72 mg/dL (ref 70–99)
POTASSIUM: 3.3 mmol/L — AB (ref 3.5–5.1)
SODIUM: 140 mmol/L (ref 135–145)

## 2018-03-24 LAB — MAGNESIUM: Magnesium: 2 mg/dL (ref 1.7–2.4)

## 2018-03-24 LAB — PROTEIN / CREATININE RATIO, URINE
CREATININE, URINE: 113 mg/dL
Protein Creatinine Ratio: 0.46 mg/mg{Cre} — ABNORMAL HIGH (ref 0.00–0.15)
TOTAL PROTEIN, URINE: 52 mg/dL

## 2018-03-24 LAB — CK: Total CK: 877 U/L — ABNORMAL HIGH (ref 49–397)

## 2018-03-24 LAB — CBC
HCT: 32.1 % — ABNORMAL LOW (ref 40.0–52.0)
Hemoglobin: 10.8 g/dL — ABNORMAL LOW (ref 13.0–18.0)
MCH: 31.9 pg (ref 26.0–34.0)
MCHC: 33.7 g/dL (ref 32.0–36.0)
MCV: 94.6 fL (ref 80.0–100.0)
PLATELETS: 140 10*3/uL — AB (ref 150–440)
RBC: 3.39 MIL/uL — AB (ref 4.40–5.90)
RDW: 14.8 % — AB (ref 11.5–14.5)
WBC: 11.9 10*3/uL — AB (ref 3.8–10.6)

## 2018-03-24 LAB — PROCALCITONIN
PROCALCITONIN: 0.8 ng/mL
Procalcitonin: 0.83 ng/mL

## 2018-03-24 LAB — ECHOCARDIOGRAM COMPLETE
HEIGHTINCHES: 67 in
Weight: 3536 oz

## 2018-03-24 LAB — GLUCOSE, CAPILLARY
GLUCOSE-CAPILLARY: 145 mg/dL — AB (ref 70–99)
GLUCOSE-CAPILLARY: 85 mg/dL (ref 70–99)
Glucose-Capillary: 91 mg/dL (ref 70–99)
Glucose-Capillary: 138 mg/dL — ABNORMAL HIGH (ref 70–99)
Glucose-Capillary: 131 mg/dL — ABNORMAL HIGH (ref 70–99)
Glucose-Capillary: 134 mg/dL — ABNORMAL HIGH (ref 70–99)
Glucose-Capillary: 118 mg/dL — ABNORMAL HIGH (ref 70–99)
Glucose-Capillary: 101 mg/dL — ABNORMAL HIGH (ref 70–99)

## 2018-03-24 LAB — C-REACTIVE PROTEIN: CRP: 23.2 mg/dL — ABNORMAL HIGH (ref <1.0)

## 2018-03-24 LAB — LACTIC ACID, PLASMA
LACTIC ACID, VENOUS: 1.3 mmol/L (ref 0.5–1.9)
Lactic Acid, Venous: 1.9 mmol/L (ref 0.5–1.9)

## 2018-03-24 LAB — SEDIMENTATION RATE: Sed Rate: >140 mm/hr — ABNORMAL HIGH (ref 0–20)

## 2018-03-24 LAB — TROPONIN I
Troponin I: 0.03 ng/mL (ref <0.03)
Troponin I: <0.03 ng/mL (ref <0.03)

## 2018-03-24 LAB — MRSA PCR SCREENING: MRSA by PCR: NEGATIVE

## 2018-03-24 MED ORDER — AMIODARONE LOAD VIA INFUSION: 150.0000 mg | Freq: Once | INTRAVENOUS | Status: DC

## 2018-03-24 MED ORDER — LAMIVUDINE 10 MG/ML PO SOLN
100.0000 mg | Freq: Every day | ORAL | Status: DC
  Administered 2018-03-25: 100 mg via ORAL
  Filled 2018-03-24 (×2): qty 10

## 2018-03-24 MED ORDER — POTASSIUM CHLORIDE 20 MEQ PO PACK
40.0000 meq | PACK | Freq: Once | ORAL | Status: AC
  Administered 2018-03-24: 40 meq via ORAL
  Filled 2018-03-24: qty 2

## 2018-03-24 MED ORDER — SODIUM CHLORIDE 0.9 % IV SOLN
2.0000 g | INTRAVENOUS | Status: DC
  Administered 2018-03-24: 2 g via INTRAVENOUS
  Filled 2018-03-24 (×2): qty 2

## 2018-03-24 MED ORDER — AMIODARONE HCL IN DEXTROSE 360-4.14 MG/200ML-% IV SOLN
30.0000 mg/h | INTRAVENOUS | Status: DC
  Administered 2018-03-25 (×2): 30 mg/h via INTRAVENOUS
  Filled 2018-03-24 (×3): qty 200

## 2018-03-24 MED ORDER — AMIODARONE IV BOLUS ONLY 150 MG/100ML
150.0000 mg | Freq: Once | INTRAVENOUS | Status: AC
  Administered 2018-03-24: 150 mg via INTRAVENOUS
  Filled 2018-03-24: qty 100

## 2018-03-24 MED ORDER — SODIUM CHLORIDE 0.9 % IV BOLUS
500.0000 mL | Freq: Once | INTRAVENOUS | Status: AC
  Administered 2018-03-24: 500 mL via INTRAVENOUS

## 2018-03-24 MED ORDER — DOLUTEGRAVIR SODIUM 50 MG PO TABS
50.0000 mg | ORAL_TABLET | Freq: Every day | ORAL | Status: DC
  Administered 2018-03-25 – 2018-03-27 (×3): 50 mg via ORAL
  Filled 2018-03-24 (×3): qty 1

## 2018-03-24 MED ORDER — AMIODARONE HCL IN DEXTROSE 360-4.14 MG/200ML-% IV SOLN
60.0000 mg/h | INTRAVENOUS | Status: AC
  Administered 2018-03-24: 60 mg/h via INTRAVENOUS
  Filled 2018-03-24: qty 200

## 2018-03-24 MED ORDER — VANCOMYCIN HCL IN DEXTROSE 1-5 GM/200ML-% IV SOLN
1000.0000 mg | INTRAVENOUS | Status: DC
  Administered 2018-03-24: 1000 mg via INTRAVENOUS
  Filled 2018-03-24 (×2): qty 200

## 2018-03-24 MED ORDER — DOCUSATE SODIUM 100 MG PO CAPS
100.0000 mg | ORAL_CAPSULE | Freq: Every day | ORAL | Status: DC
  Administered 2018-03-24 – 2018-03-27 (×4): 100 mg via ORAL
  Filled 2018-03-24 (×4): qty 1

## 2018-03-24 MED ORDER — VANCOMYCIN HCL IN DEXTROSE 1-5 GM/200ML-% IV SOLN
1000.0000 mg | Freq: Once | INTRAVENOUS | Status: AC
  Administered 2018-03-24: 1000 mg via INTRAVENOUS
  Filled 2018-03-24: qty 200

## 2018-03-24 MED ORDER — ABACAVIR SULFATE 300 MG PO TABS
600.0000 mg | ORAL_TABLET | Freq: Every day | ORAL | Status: DC
  Administered 2018-03-25 – 2018-03-27 (×3): 600 mg via ORAL
  Filled 2018-03-24 (×3): qty 2

## 2018-03-24 NOTE — Consult Note (Signed)
Reason for Consult: Pericardial effusion.  Referring Physician: Hospitalist  [name].   HPI: 57 year old white male.  Prior Engineer, manufacturing systems.  Past history of diabetes.  HIV positive without active disease.  Recent paranoia with schizophrenia Mild renal insufficiency.  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 In the last 3 days he had headache.  Thought to be his regular migraine.  Then had chills and fever.  Had episodes of being lightheaded when he stood up.  Felt weak.  Was seen in the office with a new elevation of creatinine at 3.7.  Was then hospitalized.  Creatinine rose to 4.5 Admission at EKG with ST segment elevation diffusely.  Bedside echo suggested pericardial effusion.  Was hypotensive requiring pressors. Troponin 0 0.03.  White count 11,000.  Hemoglobin 10.  Platelets 140,000.  New decrease.  Urinalysis shows 100 protein.  Unchanged.  No red cells Sed rate 140 Denies joint pain.  Denies recent skin rash.  Sometimes will get an itching rash on his elbows.  Has been short of breath.  No significant chest pain.  No ray nodes.  No photosensitive.  PMH: Diabetes.  HIV.  Reflux.  Schizophrenia.  Hyperlipidemia.  Renal insufficiency.  Neuropathy.  Migraines.  SURGICAL HISTORY: No joint surgery  Family History: No family history of connective tissue disease  Social History: No cigarettes or alcohol.  No illicit drugs.  Allergies:  Allergies  Allergen Reactions  . Trazodone And Nefazodone   . Viread [Tenofovir Disoproxil]     Damages kidneys  . Etodolac Rash    Medications:  Scheduled: . abacavir-dolutegravir-lamiVUDine  1 tablet Oral Daily  . aspirin EC  325 mg Oral Daily  . clonazePAM  0.5 mg Oral Daily  . clonazePAM  1 mg Oral BID  . divalproex  500 mg Oral BID  . docusate sodium  100 mg Oral Daily  . heparin injection (subcutaneous)  5,000 Units Subcutaneous Q8H  . iloperidone  12 mg Oral BID  . insulin aspart  0-9 Units Subcutaneous Q4H  .  pantoprazole  40 mg Oral BID  . rosuvastatin  20 mg Oral Daily  . venlafaxine XR  75 mg Oral Q breakfast        ROS: Nausea.  No vomiting.  No abdominal pain.  No diarrhea.   PHYSICAL EXAM: Blood pressure 94/68, pulse (!) 105, temperature 100.3 F (37.9 C), temperature source Oral, resp. rate (!) 30, height 5\' 7"  (1.702 m), weight 100.2 kg, SpO2 91 %. Mildly ill-appearing male.  Seen in bed in ICU.  Alert.  Sclera clear.  Oropharynx clear without ulcerations.  Neck is supple.  Chest clear.  No significant murmur.  Regular rhythm.  Nontender abdomen.  Lower extremities with no significant edema.  No rash.  No photosensitive rash.  No vasculitic rash Good range of motion of neck and shoulders.  Hands without synovitis.  Hips move well.  No knee effusions  Assessment: Sudden illness with manifestations of chills and fever, nausea, pericarditis with ST changes but no chest pain, worsening renal insufficiency.  Proteinuria chronic without red cells in urinalysis.  Viral versus inflammatory illness sudden onset  Atrial fibrillation, sudden onset, hypotension requiring pressors.  No evidence of infarct  HIV positive, adequate CD4 counts  Diabetes  Mild chronic renal insufficiency  History of paranoia and schizophrenia     Recommendations: Pending echocardiogram and renal ultrasound and nephrology opinion. Serologies and serum proteins ordered. No current indication for steroids.  Awaiting further work-up   Leeanne Mannan,  Julieanne Manson 03/24/2018, 12:22 PM

## 2018-03-24 NOTE — Progress Notes (Signed)
Pharmacy Antibiotic Note  Danny Cook. is a 57 y.o. male admitted on 03/23/2018 with sepsis.  Pharmacy has been consulted for vanc/cefepime dosing. Patient received vanc 1g  Plan: Will continue vanc 1g IV q24h w/ 6 hour stack  Will draw vanc trough 09/28 @ 1000 prior to 4th dose. Will start cefepime 2g IV q24h  Ke 0.0234 T1/2 29.6 ~ 24 hrs Goal trough 15 - 20 mcg/mL  Height: 5\' 7"  (170.2 cm) Weight: 221 lb (100.2 kg) IBW/kg (Calculated) : 66.1  Temp (24hrs), Avg:98.7 F (37.1 C), Min:98.2 F (36.8 C), Max:99.6 F (37.6 C)  Recent Labs  Lab 03/23/18 1847 03/23/18 2341 03/24/18 0204  WBC 11.3*  --  11.9*  CREATININE 4.52*  --  3.92*  LATICACIDVEN  --  1.3 1.9    Estimated Creatinine Clearance: 22.9 mL/min (A) (by C-G formula based on SCr of 3.92 mg/dL (H)).    Allergies  Allergen Reactions  . Trazodone And Nefazodone   . Viread [Tenofovir Disoproxil]     Damages kidneys  . Etodolac Rash   Thank you for allowing pharmacy to be a part of this patient's care.  Tobie Lords, PharmD, BCPS Clinical Pharmacist 03/24/2018

## 2018-03-24 NOTE — Progress Notes (Signed)
Procalcitonin has resulted back at 0.83.  Pt with AKI (Cr. 4.52). Given elevated procalcitonin, concern as to whether there is an infectious source contributing to Pericarditis.  Discussed with Dr. Mortimer Fries regarding intiating empiric abx at this time.  Dr. Mortimer Fries recommends holding off on Abx as this time given that his AKI can falsely elevate his procalcitonin.  If Procalcitonin trends upward or pt becomes febrile will initiate empiric abx.  Continue to monitor.   Darel Hong, AGACNP-BC [facility] Pager: [phone]

## 2018-03-24 NOTE — Progress Notes (Signed)
Pt remains in A-fib with rate 110-120's and with increasing Neo-synephrine requirements.  Concern that pt could possibly be progressing to tamponade.  Called and discussed with Dr. Harrell Gave of Cardiology.  Dr. Harrell Gave recommends IV amiodarone bolus and amiodarone infusion, and also recommends can continue with fluid boluses (pt has received 3.5 L to date).  Order placed for amiodarone.

## 2018-03-24 NOTE — Progress Notes (Signed)
Per Dr. Candiss Norse, PICC line placement is acceptable. He would prefer an IJ if the team is able to get it.

## 2018-03-24 NOTE — Progress Notes (Signed)
Pharmacy Antibiotic Note  Danny Cook. is a 57 y.o. male admitted on 03/23/2018 with pericarditis and AKI. Concern for sepsis. Pharmacy has been consulted for vancomycin and cefepime dosing.  Plan: Continue vancomycin 1 g IV q24h. Goal trough 15-20 mcg/mL. Trough ordered 9/28 at 1000. Continue cefepime 2 g IV q24h  Per CCM on ICU 9/25 rounds, will continue antibiotics for now and reassess after cultures result   Height: 5\' 7"  (170.2 cm) Weight: 221 lb (100.2 kg) IBW/kg (Calculated) : 66.1  Temp (24hrs), Avg:99.1 F (37.3 C), Min:98.2 F (36.8 C), Max:100.3 F (37.9 C)  Recent Labs  Lab 03/23/18 1847 03/23/18 2341 03/24/18 0204  WBC 11.3*  --  11.9*  CREATININE 4.52*  --  3.92*  LATICACIDVEN  --  1.3 1.9    Estimated Creatinine Clearance: 22.9 mL/min (A) (by C-G formula based on SCr of 3.92 mg/dL (H)).    Allergies  Allergen Reactions  . Trazodone And Nefazodone   . Viread [Tenofovir Disoproxil]     Damages kidneys  . Etodolac Rash    Antimicrobials this admission: Vancomycin 9/25 >>  Cefepime 9/25 >>   Dose adjustments this admission: N/A  Microbiology results: 9/24 BCx: NG < 12 hours  9/24 MRSA PCR: negative  Thank you for allowing pharmacy to be a part of this patient's care.  Tawnya Crook, PharmD Pharmacy Resident  03/24/2018 1:45 PM

## 2018-03-24 NOTE — Progress Notes (Signed)
[facility] NAME: Olin Gurski    MR#:  [id number]  DATE OF BIRTH:  [birth year]  SUBJECTIVE:   Patient presents to the hospital secondary to severe hypotension, dizziness and also noted to have a pericardial effusion and noted to be in acute kidney injury.  Patient denies any chest pains, but admits to some shortness of breath and has been complaining of headaches now for the past few days.  REVIEW OF SYSTEMS:    Review of Systems  Constitutional: Negative for chills and fever.  HENT: Negative for congestion and tinnitus.   Eyes: Negative for blurred vision and double vision.  Respiratory: Positive for shortness of breath. Negative for cough and wheezing.   Cardiovascular: Negative for chest pain, orthopnea and PND.  Gastrointestinal: Negative for abdominal pain, diarrhea, nausea and vomiting.  Genitourinary: Negative for dysuria and hematuria.  Neurological: Positive for dizziness and headaches. Negative for sensory change and focal weakness.  All other systems reviewed and are negative.   Nutrition: Heart Healthy/Carb modified Tolerating Diet: Yes Tolerating PT: Await Eval.    DRUG ALLERGIES:   Allergies  Allergen Reactions  . Trazodone And Nefazodone   . Viread [Tenofovir Disoproxil]     Damages kidneys  . Etodolac Rash    VITALS:  Blood pressure 106/60, pulse 99, temperature 100.3 F (37.9 C), temperature source Oral, resp. rate (!) 34, height 5\' 7"  (1.702 m), weight 100.2 kg, SpO2 94 %.  PHYSICAL EXAMINATION:   Physical Exam  GENERAL:  57 y.o.-year-old patient lying in bed in no acute distress.  EYES: Pupils equal, round, reactive to light and accommodation. No scleral icterus. Extraocular muscles intact.  HEENT: Head atraumatic, normocephalic. Oropharynx and nasopharynx clear.  NECK:  Supple, no jugular venous distention. No thyroid enlargement, no tenderness.  LUNGS: Normal breath sounds bilaterally, no  wheezing, rales, rhonchi. No use of accessory muscles of respiration.  CARDIOVASCULAR: S1, S2 normal. No murmurs, rubs, or gallops.  ABDOMEN: Soft, nontender, nondistended. Bowel sounds present. No organomegaly or mass.  EXTREMITIES: No cyanosis, clubbing, +1 edema b/l NEUROLOGIC: Cranial nerves II through XII are intact. No focal Motor or sensory deficits b/l. Globally weak   PSYCHIATRIC: The patient is alert and oriented x 3.  SKIN: No obvious rash, lesion, or ulcer.    LABORATORY PANEL:   CBC Recent Labs  Lab 03/24/18 0204  WBC 11.9*  HGB 10.8*  HCT 32.1*  PLT 140*   ------------------------------------------------------------------------------------------------------------------  Chemistries  Recent Labs  Lab 03/23/18 1847 03/24/18 0204  NA 136 140  K 4.3 3.3*  CL 101 105  CO2 21* 22  GLUCOSE 163* 72  BUN 41* 41*  CREATININE 4.52* 3.92*  CALCIUM 8.3* 7.6*  MG 1.9 2.0  AST 22  --   ALT 13  --   ALKPHOS 73  --   BILITOT 0.6  --    ------------------------------------------------------------------------------------------------------------------  Cardiac Enzymes Recent Labs  Lab 03/24/18 0204  TROPONINI 0.03*   ------------------------------------------------------------------------------------------------------------------  RADIOLOGY:  US Renal  Result Date: 03/24/2018 CLINICAL DATA:  Acute renal failure. EXAM: RENAL / URINARY TRACT ULTRASOUND COMPLETE COMPARISON:  CT scan of February 27, 2018. FINDINGS: Right Kidney: Length: 11.4 cm. 11 mm nonobstructive calculus is seen in upper pole. Echogenicity within normal limits. No mass or hydronephrosis visualized. Left Kidney: Length: 11.9 cm. Echogenicity within normal limits. No mass or hydronephrosis visualized. Bladder: Appears normal for degree of bladder distention. Bilateral ureteral jets are noted. Mild ascites is noted. IMPRESSION:  Nonobstructive right renal calculus. Mild ascites is noted. No other renal  abnormality is noted. Electronically Signed   By: Marijo Conception, M.D.   On: 03/24/2018 13:44   Dg Chest Port 1 View  Result Date: 03/24/2018 CLINICAL DATA:  Central line placement EXAM: PORTABLE CHEST 1 VIEW COMPARISON:  Chest radiograph 03/24/2018 FINDINGS: Left IJ central venous catheter tip is at the cavoatrial junction. Cardiomegaly is unchanged. No focal airspace disease. No pneumothorax. IMPRESSION: Left IJ central venous catheter tip at the cavoatrial junction. No pneumothorax. Electronically Signed   By: Ulyses Jarred M.D.   On: 03/24/2018 14:56   Dg Chest Port 1 View  Result Date: 03/24/2018 CLINICAL DATA:  Recent attempted central line placement, evaluate for possible pneumothorax EXAM: PORTABLE CHEST 1 VIEW COMPARISON:  03/23/2018 FINDINGS: Cardiac shadow remains enlarged but accentuated by the portable technique. No pneumothorax is noted following filled central line placement. No bony abnormality is seen. IMPRESSION: No evidence of pneumothorax following fail attempt at central line placement. Electronically Signed   By: Inez Catalina M.D.   On: 03/24/2018 11:08   Dg Chest Port 1 View  Result Date: 03/23/2018 CLINICAL DATA:  Weakness and fatigue EXAM: PORTABLE CHEST 1 VIEW COMPARISON:  02/22/2018 FINDINGS: Stable cardiomegaly. Nonaneurysmal thoracic aorta. Mild elevation or eventration of the right hemidiaphragm. No acute pulmonary consolidation. No effusion or pneumothorax. No overt pulmonary edema. No acute nor suspicious osseous abnormalities. No free air beneath the diaphragm. IMPRESSION: Stable cardiomegaly.  No active pulmonary disease. Electronically Signed   By: Ashley Royalty M.D.   On: 03/23/2018 22:42   [place] Ekg Site Rite  Result Date: 03/24/2018 If Site Rite image not attached, placement could not be confirmed due to current cardiac rhythm.    ASSESSMENT AND PLAN:   57 year old male with past medical history of schizophrenia, history of HIV, obstructive sleep apnea, CHF,  CKD stage III, hypertension who presented to the hospital due to dizziness and noted to be severely hypotensive and in acute on chronic renal failure.  1.  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  Baseline creatinine is around 1.13 as of August 2019. -He presents now with acute kidney injury suspected to be multifactorial in relation to increasing nonsteroidal use over the past month.  As per nephrology this is likely ATN due to hypotension and also in the setting of patient taking ACE inhibitors. - Continue IV fluid hydration, follow BUN and creatinine urine output. -Serologic work-up for underlying renal failure has been initiated per nephrology.  Renal ultrasound showing no evidence of obstruction. -BUN/creatinine improved since yesterday with IV fluid hydration.  2.  Sepsis-source unclear.  Patient presented to the hospital with severe hypotension, tachycardia. - Chest x-ray is negative for acute pathology, urinalysis negative.  Empirically patient is on IV vancomycin, cefepime.  Follow cultures. -Await further infectious disease input.  3.  Acute pericarditis- initially when patient presented to the hospital he was noted to have ST elevations consistent with a ST elevation MI but patient had no chest pain and his cardiac markers are not elevated. - Suspect this is pericarditis nonspecific and viral in nature.  Patient's echocardiogram that showed a moderate pericardial effusion but no tamponade physiology. - Seen by cardiology and continue supportive care to treat underlying renal failure.  No plans for doing pericardial window or pericardiocentesis for now.  4.  Hypotension-in the setting of sepsis/volume loss. -Continue IV fluids, Neo-Synephrine.  5.  History of paranoid schizophrenia/bipolar  disorder-continue Depakote.  6.  Depression-continue Effexor.  7.  A. fib with RVR- continue amiodarone drip as per cardiology.  Defer anticoagulation for  now.  8.  History of HIV-cont. HAART.  - await ID input. Pt's CD4 count has been good as per ID.  Pt. Is complaint.     All the records are reviewed and case discussed with Care Management/Social Worker. Management plans discussed with the patient, family and they are in agreement.  CODE STATUS: Full code  DVT Prophylaxis: Hep SQ  TOTAL TIME TAKING CARE OF THIS PATIENT: 30 minutes.   POSSIBLE D/C IN 3-4 DAYS, DEPENDING ON CLINICAL CONDITION.   Henreitta Leber M.D on 03/24/2018 at 3:36 PM  Between 7am to 6pm - Pager - [phone]  After 6pm go to [email or web address] - [facility]  Office  [phone]  CC: Primary care physician; Katheren Shams

## 2018-03-24 NOTE — Progress Notes (Signed)
Pt has converted into A-fib, HR 110-120.  Pt also with increasing requirements on Neo-synephrine gtt to maintain MAP goal.  Discussed with Dr. Mortimer Fries conversion into A-fib and increasing pressor requirements.  Dr. Mortimer Fries recommends to hold off on anticoagulation, and to give an additional 500 ml bolus NS.

## 2018-03-24 NOTE — Progress Notes (Addendum)
Pt now has become febrile, and with increasing Neo-synephrine requirements.  PCT 0.8 (previously 0.83).  Will start empiric vancomycin and cefepime.  Pharmacy consulted for dosing.

## 2018-03-24 NOTE — Progress Notes (Signed)
MEDICATION RELATED CONSULT NOTE - INITIAL   Pharmacy Consult for drug interaction review Indication: amiodarone drip for new onset afib w/ RVR  Allergies  Allergen Reactions  . Trazodone And Nefazodone   . Viread [Tenofovir Disoproxil]     Damages kidneys  . Etodolac Rash    Patient Measurements: Height: 5\' 7"  (170.2 cm) Weight: 221 lb (100.2 kg) IBW/kg (Calculated) : 66.1 Adjusted Body Weight: 77 kg  Vital Signs: Temp: 99.6 F (37.6 C) (09/25 0400) Temp Source: Oral (09/24 2200) BP: 86/49 (09/25 0400) Pulse Rate: 110 (09/25 0400) Intake/Output from previous day: 09/24 0701 - 09/25 0700 In: 3408.1 [I.V.:1352.6; IV Piggyback:2055.5] Out: 100 [Urine:100] Intake/Output from this shift: Total I/O In: 3408.1 [I.V.:1352.6; IV Piggyback:2055.5] Out: 100 [Urine:100]  Labs: Recent Labs    03/23/18 1847 03/23/18 2043 03/24/18 0204  WBC 11.3*  --  11.9*  HGB 11.2*  --  10.8*  HCT 32.8*  --  32.1*  PLT 164  --  140*  APTT  --  155*  --   CREATININE 4.52*  --  3.92*  MG 1.9  --   --   ALBUMIN 3.5  --   --   PROT 7.5  --   --   AST 22  --   --   ALT 13  --   --   ALKPHOS 73  --   --   BILITOT 0.6  --   --   BILIDIR 0.1  --   --   IBILI 0.5  --   --    Estimated Creatinine Clearance: 22.9 mL/min (A) (by C-G formula based on SCr of 3.92 mg/dL (H)).   Microbiology: Recent Results (from the past 720 hour(s))  MRSA PCR Screening     Status: None   Collection Time: 03/23/18 10:46 PM  Result Value Ref Range Status   MRSA by PCR NEGATIVE NEGATIVE Final    Comment:        The GeneXpert MRSA Assay (FDA approved for NASAL specimens only), is one component of a comprehensive MRSA colonization surveillance program. It is not intended to diagnose MRSA infection nor to guide or monitor treatment for MRSA infections. Performed at [facility], [address], [place], [place] [address]     Medical History: Past Medical History:  Diagnosis Date  . Anemia    . Anxiety   . Arthritis   . CHF (congestive heart failure) ([place])   . Chronic kidney disease    Renal Insufficiency Syndrome; Glomerulosclerosis 2013  . Complication of anesthesia   . Depressed   . Diabetes mellitus without complication ([place])   . High cholesterol   . HIV (human immunodeficiency virus infection) ([place])   . Hypertension   . 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 Paranoid disorder ([place])   . Schizophrenia, paranoid ([place])   . Sleep apnea     Medications:  Scheduled:  . abacavir-dolutegravir-lamiVUDine  1 tablet Oral Daily  . aspirin EC  325 mg Oral Daily  . clonazePAM  0.5 mg Oral Daily  . clonazePAM  1 mg Oral BID  . divalproex  500 mg Oral BID  . docusate sodium  100 mg Oral Daily  . heparin injection (subcutaneous)  5,000 Units Subcutaneous Q8H  . iloperidone  12 mg Oral BID  . insulin aspart  0-9 Units Subcutaneous Q4H  . pantoprazole  40 mg Oral BID  . rosuvastatin  20 mg Oral Daily  . venlafaxine XR  75 mg Oral Q breakfast  Assessment: Patient admitted for weakness and dehydration was thought to have STEMI d/t EKG ST elevation w/ trops < 0.03 >> 0.03, but no c/o CP. Patient does however present with a type of shock-state (not sure if cardiogenic or septic). Patient is tachycardic and is now showing afib w/ RVR started on amiodarone drip and hypotensive. LA 1.3 >> 1.9 PCT 0.83 >> 0.80 WBC 11.9 Sed rate > 140 CXR cardiomegaly EKG: afib w/ RVR, QTc: 467 msec  Pharmacy has been consulted to review drug-drug interactions for start of amiodarone therapy:  Amiodarone + venlafaxine = QTc prolongation (high severity) Amiodarone + iloperidone = QTc prolongation (high severity) Amiodarone + ondansetron = QTc prolongation (high severity) Amiodarone + oxycodone = increase oxycodone levels and risk of respiratory distress Amiodarone + clonazepam = increase clonazepam levels and risk of respiratory distress (moderate severity) Amiodarone + rosuvastatin = increase  rosuvastatin levers and risk of elevated AST/ALT, creatinine, rhabdomyolysis Clonazepam + oxycodone = increased risk of respiratory depression  Goal of Therapy:  Avoid/reduce possibility of drug-drug interactions and drug-related adverse events  Recommendations: Current QTc WNL, continue to monitor QTc intervals. If prolongation occurs; consider holding one or more QTc prolonging agents, until QTc can normalize.  Tobie Lords, PharmD, BCPS Clinical Pharmacist 03/24/2018

## 2018-03-24 NOTE — Progress Notes (Signed)
[facility]  SUBJECTIVE: Patient laying flat in bed, denies chest pain or shortness of breath   Vitals:   03/24/18 0400 03/24/18 0500 03/24/18 0600 03/24/18 0800  BP: (!) 86/49 (!) 79/59 (!) 77/55 94/68  Pulse: (!) 110 (!) 118 (!) 118 (!) 105  Resp: (!) 25 (!) 25 (!) 28 (!) 30  Temp: 99.6 F (37.6 C)   100.3 F (37.9 C)  TempSrc:    Oral  SpO2: 93% 94% 93% 91%  Weight:      Height:         Intake/Output Summary (Last 24 hours) at 03/24/2018 1324 Last data filed at 03/24/2018 1200 Gross per 24 hour  Intake 4582.7 ml  Output 675 ml  Net 3907.7 ml      PHYSICAL EXAM  General: Well developed, well nourished, in no acute distress HEENT:  Normocephalic and atramatic Neck:  No JVD.  Lungs: Clear bilaterally to auscultation and percussion. Heart: HRRR . Normal S1 and S2 without gallops or murmurs.  Abdomen: Bowel sounds are positive, abdomen soft and non-tender  Msk:  Back normal, normal gait. Normal strength and tone for age. Extremities: No clubbing, cyanosis or edema.   Neuro: Alert and oriented X 3. Psych:  Good affect, responds appropriately   LABS: Basic Metabolic Panel: Recent Labs    03/23/18 1847 03/24/18 0204  NA 136 140  K 4.3 3.3*  CL 101 105  CO2 21* 22  GLUCOSE 163* 72  BUN 41* 41*  CREATININE 4.52* 3.92*  CALCIUM 8.3* 7.6*  MG 1.9 2.0   Liver Function Tests: Recent Labs    03/23/18 1847  AST 22  ALT 13  ALKPHOS 73  BILITOT 0.6  PROT 7.5  ALBUMIN 3.5   Recent Labs    03/23/18 1847  LIPASE 34   CBC: Recent Labs    03/23/18 1847 03/24/18 0204  WBC 11.3* 11.9*  HGB 11.2* 10.8*  HCT 32.8* 32.1*  MCV 93.4 94.6  PLT 164 140*   Cardiac Enzymes: Recent Labs    03/23/18 1847 03/23/18 2341 03/24/18 0204  TROPONINI 0.03* <0.03 0.03*   BNP: Invalid input(s): POCBNP D-Dimer: No results for input(s): DDIMER in the last 72 hours. Hemoglobin A1C: No results for input(s): HGBA1C in the last 72 hours. Fasting Lipid Panel: No  results for input(s): CHOL, HDL, LDLCALC, TRIG, CHOLHDL, LDLDIRECT in the last 72 hours. Thyroid Function Tests: No results for input(s): TSH, T4TOTAL, T3FREE, THYROIDAB in the last 72 hours.  Invalid input(s): FREET3 Anemia Panel: No results for input(s): VITAMINB12, FOLATE, FERRITIN, TIBC, IRON, RETICCTPCT in the last 72 hours.  Dg Chest Port 1 View  Result Date: 03/24/2018 CLINICAL DATA:  Recent attempted central line placement, evaluate for possible pneumothorax EXAM: PORTABLE CHEST 1 VIEW COMPARISON:  03/23/2018 FINDINGS: Cardiac shadow remains enlarged but accentuated by the portable technique. No pneumothorax is noted following filled central line placement. No bony abnormality is seen. IMPRESSION: No evidence of pneumothorax following fail attempt at central line placement. Electronically Signed   By: Inez Catalina M.D.   On: 03/24/2018 11:08   Dg Chest Port 1 View  Result Date: 03/23/2018 CLINICAL DATA:  Weakness and fatigue EXAM: PORTABLE CHEST 1 VIEW COMPARISON:  02/22/2018 FINDINGS: Stable cardiomegaly. Nonaneurysmal thoracic aorta. Mild elevation or eventration of the right hemidiaphragm. No acute pulmonary consolidation. No effusion or pneumothorax. No overt pulmonary edema. No acute nor suspicious osseous abnormalities. No free air beneath the diaphragm. IMPRESSION: Stable cardiomegaly.  No active pulmonary disease. Electronically Signed  By: Ashley Royalty M.D.   On: 03/23/2018 22:42   [place] Ekg Site Rite  Result Date: 03/24/2018 If Site Rite image not attached, placement could not be confirmed due to current cardiac rhythm.    Echo LVEF 55 to 65%, small to moderate pericardial effusion, without evidence for hemodynamic compromise  TELEMETRY: Sinus tachycardia at 105 bpm:  ASSESSMENT AND PLAN:  Principal Problem:   Pericarditis with effusion Active Problems:   Chronic diastolic heart failure (HCC)   HTN (hypertension)   Diabetes (HCC)   Schizoaffective disorder, depressive  type (HCC)   HIV positive ([place])   AKI (acute kidney injury) ([place])    1.  Abnormal ECG, with PR elevation aVR, PR depression with upsloping ST elevation in inferior leads, consistent with acute pericarditis, in the absence of chest pain, with negative troponin x3, likely nonischemic 2.  Probable acute pericarditis, mild to moderate pericardial effusion without evidence for hemodynamic compromise, of uncertain clinical significance, in the setting of renal failure due to ATN as result of low blood pressure, and ACE inhibitor therapy.  Review of echo shows small amount of effusion apically, not ideal for diagnostic pericardiocentesis. 3.  Paroxysmal atrial fibrillation, converted to sinus tachycardia, on amiodarone drip 4.  Hypotension, on Neo-Synephrine,  being weaned  Recommendations  1.  Agree with overall current therapy 2.  Continue amiodarone drip for now, plan to transition to oral amiodarone 3.  Agree with weaning Neo-Synephrine 4.  Defer diagnostic pericardiocentesis 5.  If clinically indicated, may consider pericardial window, though effusion very likely uremic in nature 6.  Defer cardiac catheterization as per Dr. Arvin Collard, MD, PhD, [facility] 03/24/2018 1:24 PM

## 2018-03-24 NOTE — Progress Notes (Signed)
*  PRELIMINARY RESULTS* Echocardiogram 2D Echocardiogram has been performed.  Danny Cook 03/24/2018, 11:34 AM

## 2018-03-24 NOTE — Consult Note (Signed)
Date of Admission:  03/23/2018                 Reason for Consult: HIV    Referring Provider: Gouru    HPI: Danny Cook. is a 57 y.o. male with history of HIV, CHF, chronic kidney disease] focal sclerosis glomerular nephritis is admitted with dizziness, nausea, headaches for the past few days.  As per patient he had a headache for the past month or so.  Recently he started to feel dizzy along with nausea.  He also felt hot but did not check his temperature.  He went to his PCPs office on 03/23/2018 and  his blood pressure was 100/68, heart rate of 107, oxygen saturation of 92%.  He received a injection of Toradol 30 mg IM for headache and was asked to hold the Jardiance and Lasix and was asked to go to the  emergency department .  In the ED was found to have a creatinine of 4.52, anion gap of 14, bicarb of 21 his temperature was 98.2 blood pressure was 66/36.  Cultures were sent he also had an EKG which showed changes in the inferior and anterior leads suggestive of an MI and cardiology was consulted they thought it could be due to pericarditis he also had an echo at bedside which shows  pericardial fluid.  Patient does not have any chest pain.  He has been short of breath for the past few weeks.    He has presented to the ED quite a few times in August.  On August 3 it was for jaw pain .on August 4 he presented for intractable headache.  During that visit his vitals were blood pressure 130/74 heart rate of 105 and temperature of 98.5 CD4 was done on that date and it was 612 with 36%.  He was diagnosed as migraine.  He was discharged on Fioricet.  On August 26 his creatinine was 1.18.  He was started on Jardiance which is a DPP 4 by his endocrinologist on August 28.  He presented to [facility] ED on August 31 complaining of lightheadedness and generalized weakness.  His creatinine from August 31st was 2.86.  He was given IV fluids and discharged.  He saw his PCP on 9/24 /2019 and was asked to get  admitted.  I am asked to see the patient for HIV.  He is currently in ICU and is getting pressors for low blood pressure.  He had transient A. fib and was given amiodarone he has reversed back to sinus rhythm. He has been seen by cardiologist, nephrologist, intensivist and hospitalist.  He is also on vancomycin and cefepime. Patient states he has felt hot but never checked his temperature. He has had a chronic cough for almost a month but nonproductive. He has been short of breath on minimal exertion. He also has orthopnea and especially when he lies flat he has   cough and gets short of breath.  Patient states he was diagnosed with HIV many years ago on a routine blood test which was done as he was donating blood.  He was initially followed at [facility] and of late had been seeing Dr. Ola Spurr until May 2019.  He has been on Triumeq.  He states he is 100% adherent to his medication.    Past Medical History:  Diagnosis Date  . Anemia   . Anxiety   . Arthritis   . CHF (congestive heart failure) ([place])   . Chronic kidney disease  Renal Insufficiency Syndrome; Glomerulosclerosis 2013  . Complication of anesthesia   . Depressed   . Diabetes mellitus without complication ([place])   . High cholesterol   . HIV (human immunodeficiency virus infection) ([place])   . Hypertension   . 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 Paranoid disorder ([place])   . Schizophrenia, paranoid ([place])   . Sleep apnea     Past Surgical History:  Procedure Laterality Date  . COLONOSCOPY WITH PROPOFOL N/A 09/03/2015   Procedure: COLONOSCOPY WITH PROPOFOL;  Surgeon: Lollie Sails, MD;  Location: [facility] ENDOSCOPY;  Service: Endoscopy;  Laterality: N/A;  . DG TEETH FULL    . ESOPHAGOGASTRODUODENOSCOPY (EGD) WITH PROPOFOL N/A 07/01/2016   Procedure: ESOPHAGOGASTRODUODENOSCOPY (EGD) WITH PROPOFOL;  Surgeon: Lollie Sails, MD;  Location: [facility] ENDOSCOPY;  Service: Endoscopy;  Laterality: N/A;    Social History   Tobacco Use  .  Smoking status: Never Smoker  . Smokeless tobacco: Never Used  Substance Use Topics  . Alcohol use: No  . Drug use: No    Family History  Problem Relation Age of Onset  . Heart attack Mother   . Diabetes Mellitus II Mother   . Mental illness Mother   . CAD Mother   . Heart attack Father   . CAD Father   . Hypertension Father    Home meds Aspirin B complex Clonazepam Vitamin B12 Benadryl as needed Depakote Jardiance which was started in August 2019 when he saw his endocrinologist Flonase Lasix Gabapentin Lopid Glimepiride Metformin I iloperidone Degludec insulin Lisinopril Metformin Remeron Omeprazole Rosuvastatin Victoza Triumeq Sucralfate Effexor     Abtx:  Anti-infectives (From admission, onward)   Start     Dose/Rate Route Frequency Ordered Stop   03/24/18 1100  vancomycin (VANCOCIN) IVPB 1000 mg/200 mL premix     1,000 mg 200 mL/hr over 60 Minutes Intravenous Every 24 hours 03/24/18 0534     03/24/18 0530  vancomycin (VANCOCIN) IVPB 1000 mg/200 mL premix     1,000 mg 200 mL/hr over 60 Minutes Intravenous  Once 03/24/18 0527 03/24/18 0742   03/24/18 0530  ceFEPIme (MAXIPIME) 2 g in sodium chloride 0.9 % 100 mL IVPB     2 g 200 mL/hr over 30 Minutes Intravenous Every 24 hours 03/24/18 0527     03/23/18 2200  abacavir-dolutegravir-lamiVUDine (TRIUMEQ) 600-50-300 MG per tablet 1 tablet     1 tablet Oral Daily 03/23/18 2154         Review of Systems: Review of Systems  Constitutional:       Felt hot, weight gain, sob  HENT: Negative for congestion, hearing loss and sore throat.   Eyes: Positive for blurred vision (a few days ago- not now).  Respiratory: Positive for cough and shortness of breath. Negative for sputum production.   Cardiovascular: Positive for orthopnea and leg swelling. Negative for chest pain and palpitations.  Gastrointestinal: Positive for nausea and vomiting. Negative for abdominal pain, diarrhea and melena.  Genitourinary:  Negative for dysuria.       Decreased urine output  Musculoskeletal: Positive for back pain.  Skin: Negative for itching and rash.  Neurological: Positive for dizziness, weakness and headaches. Negative for focal weakness.  Endo/Heme/Allergies: Does not bruise/bleed easily.  Psychiatric/Behavioral: Positive for depression.      Allergies  Allergen Reactions  . Trazodone And Nefazodone   . Viread [Tenofovir Disoproxil]     Damages kidneys  . Etodolac Rash    OBJECTIVE: Blood pressure 94/68, pulse (!) 105, temperature 100.3  F (37.9 C), temperature source Oral, resp. rate (!) 30, height _0  (1.702 m), weight 100.2 kg, SpO2 91 %.  Physical Exam Constitutional: Well-nourished, no obvious distress, Eyes: Pupils reacting to light ENT: Oral cavity normal with no exudate, no nasal congestion, no sinus tenderness CVS: S1-S2 tachycardia RS: Bilateral air entry GI: Abdomen soft distention no tenderness GU MSK moves all limbs, ankle edema, no joint swelling SKIN: No rash Neurologic: Nonfocal examination cranial nerves intact Psychiatric: Normal affect Lymphatic: No adenopathy palpated  Lab Results CBC    Component Value Date/Time   WBC 11.9 (H) 03/24/2018 0204   RBC 3.39 (L) 03/24/2018 0204   HGB 10.8 (L) 03/24/2018 0204   HGB 11.7 (L) 01/31/2018 1434   HCT 32.1 (L) 03/24/2018 0204   HCT 35.0 (L) 01/31/2018 1434   PLT 140 (L) 03/24/2018 0204   PLT 195 01/31/2018 1434   MCV 94.6 03/24/2018 0204   MCV 93 01/31/2018 1434   MCV 93 10/23/2014 1719   MCH 31.9 03/24/2018 0204   MCHC 33.7 03/24/2018 0204   RDW 14.8 (H) 03/24/2018 0204   RDW 14.4 01/31/2018 1434   RDW 13.0 10/23/2014 1719   LYMPHSABS 3.1 02/27/2018 2012   LYMPHSABS 1.7 01/31/2018 1434   LYMPHSABS 2.3 12/27/2012 0550   MONOABS 0.7 02/27/2018 2012   MONOABS 0.7 12/27/2012 0550   EOSABS 0.2 02/27/2018 2012   EOSABS 0.0 01/31/2018 1434   EOSABS 0.3 12/27/2012 0550   BASOSABS 0.1 02/27/2018 2012   BASOSABS  0.0 01/31/2018 1434   BASOSABS 0.1 12/27/2012 0550    CMP Latest Ref Rng & Units 03/24/2018 03/23/2018 02/27/2018  Glucose 70 - 99 mg/dL 72 163(H) 171(H)  BUN 6 - 20 mg/dL 41(H) 41(H) 35(H)  Creatinine 0.61 - 1.24 mg/dL 3.92(H) 4.52(H) 2.86(H)  Sodium 135 - 145 mmol/L 140 136 140  Potassium 3.5 - 5.1 mmol/L 3.3(L) 4.3 3.9  Chloride 98 - 111 mmol/L 105 101 105  CO2 22 - 32 mmol/L 22 21(L) 24  Calcium 8.9 - 10.3 mg/dL 7.6(L) 8.3(L) 8.9  Total Protein 6.5 - 8.1 g/dL - 7.5 -  Total Bilirubin 0.3 - 1.2 mg/dL - 0.6 -  Alkaline Phos 38 - 126 U/L - 73 -  AST 15 - 41 U/L - 22 -  ALT 0 - 44 U/L - 13 -      Microbiology: Recent Results (from the past 240 hour(s))  MRSA PCR Screening     Status: None   Collection Time: 03/23/18 10:46 PM  Result Value Ref Range Status   MRSA by PCR NEGATIVE NEGATIVE Final    Comment:        The GeneXpert MRSA Assay (FDA approved for NASAL specimens only), is one component of a comprehensive MRSA colonization surveillance program. It is not intended to diagnose MRSA infection nor to guide or monitor treatment for MRSA infections. Performed at [facility], [address] [place], [place] [address]   CULTURE, BLOOD (ROUTINE X 2) w Reflex to ID Panel     Status: None (Preliminary result)   Collection Time: 03/23/18 11:44 PM  Result Value Ref Range Status   Specimen Description BLOOD LEFT ANTECUBITAL  Final   Special Requests   Final    BOTTLES DRAWN AEROBIC AND ANAEROBIC Blood Culture adequate volume   Culture   Final    NO GROWTH < 12 HOURS Performed at [facility], [address] [place], [place] [address]    Report Status PENDING  Incomplete  CULTURE,  BLOOD (ROUTINE X 2) w Reflex to ID Panel     Status: None (Preliminary result)   Collection Time: 03/23/18 11:52 PM  Result Value Ref Range Status   Specimen Description BLOOD BLOOD LEFT HAND  Final   Special Requests   Final    BOTTLES DRAWN AEROBIC AND ANAEROBIC Blood  Culture adequate volume   Culture   Final    NO GROWTH < 12 HOURS Performed at [facility], [address] [place], [place] [address]    Report Status PENDING  Incomplete    Radiographs and labs were personally reviewed by me.   Assessment and Plan 57 y.o. male with history of HIV, CHF, chronic kidney disease] focal sclerosis glomera nephritis is admitted with dizziness, nausea, headaches for the past few days.  As per patient he had a headache for the past month or so.  Recently he started to feel dizzy along with nausea.  He also felt hot but did not check his temperature.  He went to his PCPs office on 03/23/2018 and was and his blood pressure was 100/68, heart rate of 107, oxygen saturation of 92%.  He received a injection of Toradol 30 mg IM for headache and was asked to hold the Jardiance and Lasix and was asked to go to the  emergency department .  Headache, nausea, shortness of breath with subjective fever but no documented temperature in the hospital.  Likely the symptoms are related to his worsening kidney failure. Not sure what the reason of his kidney failure.  But there are only some medications which could have aggravated the condition and also with underlying renal failure could have caused side effects including metformin, Jardiance. Also he is on Triumeq for HIV which is a fixed drug combination of dolutegravir, Epivir and abacavir.  The Epivir dose will have to be adjusted for creatinine clearance . Will change the single pill to its separate components tomorrow   AKI on CKD Avoid nephrotoxic drug  Hypotension: Likely related to hypovolemia, medications. Pericardial effusion/tamponade was being considered Sepsis is a concern but I do not see clinically any evidence for of infection. Procalcitonin is mildly elevated but that could be high  in kidney failure. Will avoid nephrotoxic drugs like vancomycin. Continuing cefepime for now   Pericardial effusion- doubt  this is acute pericarditis - uremic pericardial effusion VS other causes including infection/autoimmune. ESR is high > 130 -with headache  Temporal arteritis a concern but no temporal tenderness tenderness    HIV- well controlled -says he never had KS. Last cd4 612 and Vl 20- On triumeq- need to adjust the medicine because of low crcl  DM- was on multiple meds- GLP A1, SGLTP2, Metformin, insulin  CHF  HTN- lasix, lisinoril  Tsosie Billing, MD  03/24/2018, 1:48 PM  Note:  This document was prepared using Dragon voice recognition software and may include unintentional dictation errors.

## 2018-03-24 NOTE — Consult Note (Signed)
Date: 03/24/2018                  Patient Name:  Danny Cook.  MRN: [id number]  DOB: 1961/05/24  Age / Sex: 57 y.o., male         PCP: [facility]                 Service Requesting Consult: IM/ Henreitta Leber, MD                 Reason for Consult: ARF            History of Present Illness: Patient is a 57 y.o. caucsian male with medical problems of HIV, CKD with FSGS, who was admitted to [facility] on 03/23/2018 for evaluation of weakness, tiredness and dizziness for last 3 days.  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 He is followed by [facility] and was last seen by Dr Kathrynn Ducking. Managed conservatively with ACE-I.  Baseline creatinine of 1.18 from February 22, 2018.  Of note, patient has had similar episodes of dizziness, tiredness and was evaluated for dehydration about a month ago in the emergency room.  He was given a bolus of IV fluids. A CT scan of the abdomen and pelvis on August 31 show nonobstructing right kidney stone of 11 mm Patient states he was taking nonsteroidals about a month ago and was asked to discontinue. He presents with another elevated creatinine of 4.52 which has improved to 3.92 today Currently he is requiring IV pressor support.  He was evaluated by cardiologist for elevated troponin.  Diagnosed with acute pericarditis and pericardial effusion..  Patient to undergo 2D echo this morning    Medications: Outpatient medications: Medications Prior to Admission  Medication Sig Dispense Refill Last Dose  . Abacavir-Dolutegravir-Lamivud (TRIUMEQ) 600-50-300 MG TABS Take 1 tablet by mouth daily.   01/30/2018 at 0830  . aspirin 325 MG tablet Take 325 mg by mouth daily.   01/30/2018 at 0830  . butalbital-acetaminophen-caffeine (FIORICET, ESGIC) 50-325-40 MG tablet Take 1-2 tablets by mouth every 6 (six) hours as needed. 20 tablet 0   . clonazePAM (KLONOPIN) 1 MG tablet Take 0.5-1 mg by mouth 3 (three) times daily. Take 1 mg in the morning,  0.5 mg mid-day and 1 mg in the evening.   01/30/2018 at 0830  . cyanocobalamin 1000 MCG tablet Take 1,000 mcg by mouth daily.   01/30/2018 at 0830  . diphenhydrAMINE (BENADRYL) 50 MG capsule Take 1 capsule (50 mg total) by mouth at bedtime as needed for sleep. 30 capsule 0 PRN at PRN  . divalproex (DEPAKOTE ER) 250 MG 24 hr tablet Take 500 mg by mouth 2 (two) times daily.    01/30/2018 at 0830  . FANAPT 12 MG TABS Take 12 mg by mouth 2 (two) times daily.    01/30/2018 at 0830  . furosemide (LASIX) 20 MG tablet Take 10 mg by mouth daily.   01/30/2018 at 0830  . gabapentin (NEURONTIN) 300 MG capsule Take 300 mg by mouth 3 (three) times daily.   01/30/2018 at 0830  . gemfibrozil (LOPID) 600 MG tablet Take 1 tablet by mouth 2 (two) times daily.   01/30/2018 at 0830  . Insulin Degludec (TRESIBA FLEXTOUCH [place]) Inject 94 Units into the skin daily.   01/29/2018 at 0800  . liraglutide (VICTOZA) 18 MG/3ML SOPN Inject 1.8 mg into the skin daily.   01/29/2018 at 0800  . lisinopril (PRINIVIL,ZESTRIL) 20 MG tablet  Take 20 mg by mouth daily.   01/30/2018 at 0830  . metFORMIN (GLUCOPHAGE) 1000 MG tablet Take 1,000 mg by mouth 2 (two) times daily with a meal.   01/30/2018 at 0830  . mirtazapine (REMERON) 30 MG tablet Take 30 mg by mouth at bedtime.   01/29/2018 at 2000  . omeprazole (PRILOSEC) 40 MG capsule Take 40 mg by mouth 2 (two) times daily.   01/30/2018 at 0830  . promethazine (PHENERGAN) 6.25 MG/5ML syrup Take 6.25 mg by mouth every 6 (six) hours as needed for nausea or vomiting.   PRN at PRN  . rosuvastatin (CRESTOR) 20 MG tablet Take 20 mg by mouth daily.   01/30/2018 at 0830  . sucralfate (CARAFATE) 1 g tablet Take 1 g by mouth 2 (two) times daily.   01/30/2018 at 0830  . triamcinolone cream (KENALOG) 0.1 % Apply 1 application topically 2 (two) times daily as needed. 30 g 0 PRN at PRN  . venlafaxine XR (EFFEXOR-XR) 75 MG 24 hr capsule Take 1 capsule (75 mg total) by mouth daily with breakfast. 30 capsule 0 01/30/2018 at 0830  . Insulin  Degludec 200 UNIT/ML SOPN Inject 20 Units into the skin daily. (Patient not taking: Reported on 01/30/2018)   Not Taking at Unknown time  . meclizine (ANTIVERT) 25 MG tablet Take 1 tablet (25 mg total) by mouth 3 (three) times daily as needed for dizziness or nausea. (Patient not taking: Reported on 03/23/2018) 30 tablet 1 Not Taking at Unknown time  . ondansetron (ZOFRAN ODT) 4 MG disintegrating tablet Take 1 tablet (4 mg total) by mouth every 8 (eight) hours as needed for nausea or vomiting. (Patient not taking: Reported on 03/23/2018) 20 tablet 0 Not Taking at Unknown time    Current medications: Current Facility-Administered Medications  Medication Dose Route Frequency Provider Last Rate Last Dose  . abacavir-dolutegravir-lamiVUDine (TRIUMEQ) 161-09-604 MG per tablet 1 tablet  1 tablet Oral Daily Lance Coon, MD   1 tablet at 03/24/18 0004  . acetaminophen (TYLENOL) tablet 650 mg  650 mg Oral Q6H PRN Lance Coon, MD       Or  . acetaminophen (TYLENOL) suppository 650 mg  650 mg Rectal Q6H PRN Lance Coon, MD      . amiodarone (NEXTERONE PREMIX) 360-4.14 MG/200ML-% (1.8 mg/mL) IV infusion  60 mg/hr Intravenous Continuous Darel Hong D, NP 33.3 mL/hr at 03/24/18 0541 60 mg/hr at 03/24/18 0541   Followed by  . amiodarone (NEXTERONE PREMIX) 360-4.14 MG/200ML-% (1.8 mg/mL) IV infusion  30 mg/hr Intravenous Continuous Darel Hong D, NP      . aspirin EC tablet 325 mg  325 mg Oral Daily Lance Coon, MD      . ceFEPIme (MAXIPIME) 2 g in sodium chloride 0.9 % 100 mL IVPB  2 g Intravenous Q24H Lance Coon, MD 200 mL/hr at 03/24/18 0555 2 g at 03/24/18 0555  . clonazePAM (KLONOPIN) tablet 0.5 mg  0.5 mg Oral Daily Lance Coon, MD      . clonazePAM [facility] [place]) tablet 1 mg  1 mg Oral BID Lance Coon, MD   1 mg at 03/23/18 2255  . divalproex (DEPAKOTE ER) 24 hr tablet 500 mg  500 mg Oral BID Lance Coon, MD   500 mg at 03/24/18 0002  . docusate sodium (COLACE) capsule 100 mg  100 mg Oral  Daily Darel Hong D, NP      . heparin injection 5,000 Units  5,000 Units Subcutaneous Q8H Lance Coon, MD   5,000 Units  at 03/24/18 7591  . iloperidone (FANAPT) tablet 12 mg  12 mg Oral BID Lance Coon, MD   12 mg at 03/24/18 0003  . insulin aspart (novoLOG) injection 0-9 Units  0-9 Units Subcutaneous Q4H Lance Coon, MD   1 Units at 03/24/18 [phone]  . ondansetron (ZOFRAN) tablet 4 mg  4 mg Oral Q6H PRN Lance Coon, MD       Or  . ondansetron [facility]) injection 4 mg  4 mg Intravenous Q6H PRN Lance Coon, MD      . oxyCODONE (Oxy IR/ROXICODONE) immediate release tablet 5 mg  5 mg Oral Q4H PRN Lance Coon, MD      . pantoprazole (PROTONIX) EC tablet 40 mg  40 mg Oral BID Lance Coon, MD   40 mg at 03/23/18 2255  . phenylephrine (NEOSYNEPHRINE) 10-0.9 MG/250ML-% infusion  0-400 mcg/min Intravenous Titrated Darel Hong D, NP 240 mL/hr at 03/24/18 0643 160 mcg/min at 03/24/18 0643  . rosuvastatin (CRESTOR) tablet 20 mg  20 mg Oral Daily Lance Coon, MD   20 mg at 03/23/18 2255  . vancomycin (VANCOCIN) IVPB 1000 mg/200 mL premix  1,000 mg Intravenous Q24H Lance Coon, MD      . venlafaxine XR [facility]) 24 hr capsule 75 mg  75 mg Oral Q breakfast Lance Coon, MD   75 mg at 03/24/18 0820      Allergies: Allergies  Allergen Reactions  . Trazodone And Nefazodone   . Viread [Tenofovir Disoproxil]     Damages kidneys  . Etodolac Rash      Past Medical History: Past Medical History:  Diagnosis Date  . Anemia   . Anxiety   . Arthritis   . CHF (congestive heart failure) ([place])   . Chronic kidney disease    Renal Insufficiency Syndrome; Glomerulosclerosis 2013  . Complication of anesthesia   . Depressed   . Diabetes mellitus without complication ([place])   . High cholesterol   . HIV (human immunodeficiency virus infection) ([place])   . Hypertension   . 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 Paranoid disorder ([place])   . Schizophrenia, paranoid ([place])   . Sleep apnea      Past  Surgical History: Past Surgical History:  Procedure Laterality Date  . COLONOSCOPY WITH PROPOFOL N/A 09/03/2015   Procedure: COLONOSCOPY WITH PROPOFOL;  Surgeon: Lollie Sails, MD;  Location: [facility] ENDOSCOPY;  Service: Endoscopy;  Laterality: N/A;  . DG TEETH FULL    . ESOPHAGOGASTRODUODENOSCOPY (EGD) WITH PROPOFOL N/A 07/01/2016   Procedure: ESOPHAGOGASTRODUODENOSCOPY (EGD) WITH PROPOFOL;  Surgeon: Lollie Sails, MD;  Location: [facility] ENDOSCOPY;  Service: Endoscopy;  Laterality: N/A;     Family History: Family History  Problem Relation Age of Onset  . Heart attack Mother   . Diabetes Mellitus II Mother   . Mental illness Mother   . CAD Mother   . Heart attack Father   . CAD Father   . Hypertension Father      Social History: Social History   Socioeconomic History  . Marital status: Single    Spouse name: Not on file  . Number of children: Not on file  . Years of education: 55  . Highest education level: GED or equivalent  Occupational History  . Not on file  Social Needs  . Financial resource strain: Not hard at all  . Food insecurity:    Worry: Sometimes true    Inability: Never true  . Transportation needs:    Medical: No  Non-medical: No  Tobacco Use  . Smoking status: Never Smoker  . Smokeless tobacco: Never Used  Substance and Sexual Activity  . Alcohol use: No  . Drug use: No  . Sexual activity: Never  Lifestyle  . Physical activity:    Days per week: 0 days    Minutes per session: 0 min  . Stress: To some extent  Relationships  . Social connections:    Talks on phone: More than three times a week    Gets together: Three times a week    Attends religious service: Never    Active member of club or organization: No    Attends meetings of clubs or organizations: Never    Relationship status: Never married  . Intimate partner violence:    Fear of current or ex partner: Patient refused    Emotionally abused: Patient refused    Physically abused:  Patient refused    Forced sexual activity: Patient refused  Other Topics Concern  . Not on file  Social History Narrative  . Not on file     Review of Systems: Gen: Generalized weakness, subjective fevers HEENT: No vision or hearing complaints CV: No chest pain at present Resp: No cough or sputum production GI: Decreased appetite.  No nausea, vomiting or diarrhea GU : Denies any blood in the urine MS: Generalized weakness Derm:  No complaints Psych: No complaints Heme: No complaints Neuro: No complaints Endocrine.  Patient has been noncompliant with diet  Vital Signs: Blood pressure 94/68, pulse (!) 105, temperature 100.3 F (37.9 C), temperature source Oral, resp. rate (!) 30, height 5\' 7"  (1.702 m), weight 100.2 kg, SpO2 91 %.   Intake/Output Summary (Last 24 hours) at 03/24/2018 0940 Last data filed at 03/24/2018 1601 Gross per 24 hour  Intake 4582.7 ml  Output 100 ml  Net 4482.7 ml    Weight trends: [employer]   03/23/18 1839  Weight: 100.2 kg    Physical Exam: General:  Ill-appearing, laying in the bed  HEENT  anicteric, dry oral mucous membranes  Neck:  Supple  Lungs:  Bibasilar crackles  Heart::  Regular,   Abdomen:  Soft, nontender  Extremities:  No peripheral edema  Neurologic:  Alert, oriented  Skin:  No acute rashes             Lab results: Basic Metabolic Panel: Recent Labs  Lab 03/23/18 1847 03/24/18 0204  NA 136 140  K 4.3 3.3*  CL 101 105  CO2 21* 22  GLUCOSE 163* 72  BUN 41* 41*  CREATININE 4.52* 3.92*  CALCIUM 8.3* 7.6*  MG 1.9 2.0    Liver Function Tests: Recent Labs  Lab 03/23/18 1847  AST 22  ALT 13  ALKPHOS 73  BILITOT 0.6  PROT 7.5  ALBUMIN 3.5   Recent Labs  Lab 03/23/18 1847  LIPASE 34   No results for input(s): AMMONIA in the last 168 hours.  CBC: Recent Labs  Lab 03/23/18 1847 03/24/18 0204  WBC 11.3* 11.9*  HGB 11.2* 10.8*  HCT 32.8* 32.1*  MCV 93.4 94.6  PLT 164 140*    Cardiac  Enzymes: Recent Labs  Lab 03/24/18 0204  TROPONINI 0.03*    BNP: Invalid input(s): POCBNP  CBG: Recent Labs  Lab 03/23/18 2217 03/24/18 0004 03/24/18 0423 03/24/18 0718 03/24/18 0823  GLUCAP 105* 85 91 138* 131*    Microbiology: Recent Results (from the past 720 hour(s))  MRSA PCR Screening     Status: None  Collection Time: 03/23/18 10:46 PM  Result Value Ref Range Status   MRSA by PCR NEGATIVE NEGATIVE Final    Comment:        The GeneXpert MRSA Assay (FDA approved for NASAL specimens only), is one component of a comprehensive MRSA colonization surveillance program. It is not intended to diagnose MRSA infection nor to guide or monitor treatment for MRSA infections. Performed at [facility], [address] [place], [place] [address]   CULTURE, BLOOD (ROUTINE X 2) w Reflex to ID Panel     Status: None (Preliminary result)   Collection Time: 03/23/18 11:44 PM  Result Value Ref Range Status   Specimen Description BLOOD LEFT ANTECUBITAL  Final   Special Requests   Final    BOTTLES DRAWN AEROBIC AND ANAEROBIC Blood Culture adequate volume   Culture   Final    NO GROWTH < 12 HOURS Performed at [facility], [address] [place], [place] [address]    Report Status PENDING  Incomplete  CULTURE, BLOOD (ROUTINE X 2) w Reflex to ID Panel     Status: None (Preliminary result)   Collection Time: 03/23/18 11:52 PM  Result Value Ref Range Status   Specimen Description BLOOD BLOOD LEFT HAND  Final   Special Requests   Final    BOTTLES DRAWN AEROBIC AND ANAEROBIC Blood Culture adequate volume   Culture   Final    NO GROWTH < 12 HOURS Performed at [facility], [address] [place], [place] [address]    Report Status PENDING  Incomplete     Coagulation Studies: Recent Labs    03/23/18 29-Sep-2041  LABPROT 16.5*  INR 1.34    Urinalysis: No results for input(s): COLORURINE, LABSPEC, PHURINE, GLUCOSEU, HGBUR, BILIRUBINUR, KETONESUR,  PROTEINUR, UROBILINOGEN, NITRITE, LEUKOCYTESUR in the last 72 hours.  Invalid input(s): APPERANCEUR      Imaging: Dg Chest Port 1 View  Result Date: 03/23/2018 CLINICAL DATA:  Weakness and fatigue EXAM: PORTABLE CHEST 1 VIEW COMPARISON:  02/22/2018 FINDINGS: Stable cardiomegaly. Nonaneurysmal thoracic aorta. Mild elevation or eventration of the right hemidiaphragm. No acute pulmonary consolidation. No effusion or pneumothorax. No overt pulmonary edema. No acute nor suspicious osseous abnormalities. No free air beneath the diaphragm. IMPRESSION: Stable cardiomegaly.  No active pulmonary disease. Electronically Signed   By: Ashley Royalty M.D.   On: 03/23/2018 22:42   [place] Ekg Site Rite  Result Date: 03/24/2018 If Site Rite image not attached, placement could not be confirmed due to current cardiac rhythm.     Assessment & Plan: Pt is a 57 y.o. caucsian  male with CKD secondary to FSGS, HIV,Schizophrenia, Diabetes mellitus, HTN, OSA , was admitted on 03/23/2018 with hypotension, pericarditis, dizziness, generalized weakness.   1.  Acute renal failure, likely multifactorial Patient has history of nonsteroidal use until about a month ago.  He also has a right kidney stone 11 mm that was nonobstructive as per CT done on August 26.  He was evaluated for hypotension, dizziness and similar complaints about a month ago.  Other differential diagnosis includes ATN due to hypotension in the setting of taking ACE inhibitors.  Patient states that he has not missed his HIV medications therefore HIV-associated nephropathy less likely Will obtain urinalysis, urine protein to creatinine ratio, add to serologies in light of current pericardial effusion.  Renal ultrasound is pending.  Serum creatinine has improved today to 3.92 from IV resuscitation and hemodynamic stability.  Continue management as per ICU team  Electrolytes and  volume status are acceptable.  No acute indication for hemodialysis at  present Patient has poor IV access prefer IJ PICC line if possible  2.  FSGS Baseline creatinine 1.13 from January 30, 2018.  GFR greater than 60.  Managed with ACE inhibitors  3.  Acute pericarditis Evaluation by cardiology is ongoing.  4. Hypokalemia -Electrolyte replacement as per ICU protocols.     LOS: [place] 9/25/20199:40 AM  [place], [place]  Note: This note was prepared with Dragon dictation. Any transcription errors are unintentional

## 2018-03-25 ENCOUNTER — Inpatient Hospital Stay
Admit: 2018-03-25 | Discharge: 2018-03-25 | Disposition: A | Discharge status: Home / Self Care | Payer: Medicare Other | Attending: Pulmonary Disease | Admitting: Pulmonary Disease

## 2018-03-25 ENCOUNTER — Inpatient Hospital Stay: Payer: Medicare Other

## 2018-03-25 DIAGNOSIS — R4182 Altered mental status, unspecified: Secondary | ICD-10-CM

## 2018-03-25 LAB — CBC WITH DIFFERENTIAL/PLATELET
BASOS ABS: 0 10*3/uL (ref 0–0.1)
Basophils Relative: 1 %
Eosinophils Absolute: 0 10*3/uL (ref 0–0.7)
Eosinophils Relative: 0 %
HCT: 25.3 % — ABNORMAL LOW (ref 40.0–52.0)
Hemoglobin: 8.9 g/dL — ABNORMAL LOW (ref 13.0–18.0)
Lymphocytes Relative: 21 %
Lymphs Abs: 2 10*3/uL (ref 1.0–3.6)
MCH: 32.4 pg (ref 26.0–34.0)
MCHC: 35.1 g/dL (ref 32.0–36.0)
MCV: 92.2 fL (ref 80.0–100.0)
MONO ABS: 1.1 10*3/uL — AB (ref 0.2–1.0)
MONOS PCT: 12 %
Neutro Abs: 6.1 10*3/uL (ref 1.4–6.5)
Neutrophils Relative %: 66 %
Platelets: 113 10*3/uL — ABNORMAL LOW (ref 150–440)
RBC: 2.74 MIL/uL — ABNORMAL LOW (ref 4.40–5.90)
RDW: 14.5 % (ref 11.5–14.5)
WBC: 9.3 10*3/uL (ref 3.8–10.6)

## 2018-03-25 LAB — PROTIME-INR
INR: 1.22
INR: 1.19
PROTHROMBIN TIME: 15 s (ref 11.4–15.2)
Prothrombin Time: 15.3 seconds — ABNORMAL HIGH (ref 11.4–15.2)

## 2018-03-25 LAB — C4 COMPLEMENT: COMPLEMENT C4, BODY FLUID: 21 mg/dL (ref 14–44)

## 2018-03-25 LAB — T-HELPER CELLS CD4/CD8 %
% CD 4 Pos. Lymph.: 45.3 % (ref 30.8–58.5)
Absolute CD 4 Helper: 1314 /uL (ref 359–1519)
Basophils Absolute: 0 10*3/uL (ref 0.0–0.2)
Basos: 0 %
CD3+CD4+ CELLS/CD3+CD8+ CELLS BLD: 1.16 (ref 0.92–3.72)
CD3+CD8+ CELLS # BLD: 1128 /uL — AB (ref 109–897)
CD3+CD8+ CELLS NFR BLD: 38.9 % — AB (ref 12.0–35.5)
EOS (ABSOLUTE): 0.1 10*3/uL (ref 0.0–0.4)
EOS: 1 %
Hematocrit: 29.5 % — ABNORMAL LOW (ref 37.5–51.0)
Hemoglobin: 9.5 g/dL — ABNORMAL LOW (ref 13.0–17.7)
IMMATURE GRANS (ABS): 0 10*3/uL (ref 0.0–0.1)
Immature Granulocytes: 0 %
LYMPHS ABS: 2.9 10*3/uL (ref 0.7–3.1)
Lymphs: 29 %
MCH: 30.4 pg (ref 26.6–33.0)
MCHC: 32.2 g/dL (ref 31.5–35.7)
MCV: 95 fL (ref 79–97)
Monocytes Absolute: 1.3 10*3/uL — ABNORMAL HIGH (ref 0.1–0.9)
Monocytes: 14 %
NEUTROS PCT: 56 %
Neutrophils Absolute: 5.5 10*3/uL (ref 1.4–7.0)
PLATELETS: 156 10*3/uL (ref 150–450)
RBC: 3.12 x10E6/uL — AB (ref 4.14–5.80)
RDW: 14.9 % (ref 12.3–15.4)
WBC: 9.8 10*3/uL (ref 3.4–10.8)

## 2018-03-25 LAB — ECHOCARDIOGRAM COMPLETE
Height: 67 in
WEIGHTICAEL: 3536 [oz_av]

## 2018-03-25 LAB — BLOOD GAS, ARTERIAL
Acid-base deficit: 6.9 mmol/L — ABNORMAL HIGH (ref 0.0–2.0)
Bicarbonate: 17.2 mmol/L — ABNORMAL LOW (ref 20.0–28.0)
FIO2: 0.21
O2 SAT: 90 %
PATIENT TEMPERATURE: 37
PO2 ART: 60 mmHg — AB (ref 83.0–108.0)
pCO2 arterial: 29 mmHg — ABNORMAL LOW (ref 32.0–48.0)
pH, Arterial: 7.38 (ref 7.350–7.450)

## 2018-03-25 LAB — COMPREHENSIVE METABOLIC PANEL
ALT: 18 U/L (ref 0–44)
AST: 34 U/L (ref 15–41)
Albumin: 2.6 g/dL — ABNORMAL LOW (ref 3.5–5.0)
Alkaline Phosphatase: 47 U/L (ref 38–126)
Anion gap: 10 (ref 5–15)
BUN: 39 mg/dL — AB (ref 6–20)
CHLORIDE: 109 mmol/L (ref 98–111)
CO2: 19 mmol/L — ABNORMAL LOW (ref 22–32)
Calcium: 6.9 mg/dL — ABNORMAL LOW (ref 8.9–10.3)
Creatinine, Ser: 2.84 mg/dL — ABNORMAL HIGH (ref 0.61–1.24)
GFR calc Af Amer: 26 mL/min — ABNORMAL LOW (ref >60)
GFR, EST NON AFRICAN AMERICAN: 23 mL/min — AB (ref >60)
Glucose, Bld: 100 mg/dL — ABNORMAL HIGH (ref 70–99)
Potassium: 3.5 mmol/L (ref 3.5–5.1)
Sodium: 138 mmol/L (ref 135–145)
Total Bilirubin: 0.7 mg/dL (ref 0.3–1.2)
Total Protein: 6 g/dL — ABNORMAL LOW (ref 6.5–8.1)

## 2018-03-25 LAB — URINE DRUG SCREEN, QUALITATIVE (ARMC ONLY)
AMPHETAMINES, UR SCREEN: NOT DETECTED
BENZODIAZEPINE, UR SCRN: NOT DETECTED
Barbiturates, Ur Screen: POSITIVE — AB
Cannabinoid 50 Ng, Ur ~~LOC~~: NOT DETECTED
Cocaine Metabolite,Ur ~~LOC~~: NOT DETECTED
MDMA (Ecstasy)Ur Screen: NOT DETECTED
METHADONE SCREEN, URINE: NOT DETECTED
OPIATE, UR SCREEN: NOT DETECTED
Phencyclidine (PCP) Ur S: NOT DETECTED
Tricyclic, Ur Screen: NOT DETECTED

## 2018-03-25 LAB — MAGNESIUM: MAGNESIUM: 1.9 mg/dL (ref 1.7–2.4)

## 2018-03-25 LAB — ANTIPHOSPHOLIPID SYNDROME EVAL, BLD
Anticardiolipin IgA: <9 APL U/mL (ref 0–11)
Anticardiolipin IgG: <9 GPL U/mL (ref 0–14)
Anticardiolipin IgM: <9 MPL U/mL (ref 0–12)
DRVVT: 39.8 s (ref 0.0–47.0)
PHOSPHATYDALSERINE, IGA: 3 {APS'U} (ref 0–20)
PHOSPHATYDALSERINE, IGM: 5 {MPS'U} (ref 0–25)
PTT LA: 44.9 s (ref 0.0–51.9)
Phosphatydalserine, IgG: 6 GPS IgG (ref 0–11)

## 2018-03-25 LAB — GLUCOSE, CAPILLARY
GLUCOSE-CAPILLARY: 83 mg/dL (ref 70–99)
GLUCOSE-CAPILLARY: 237 mg/dL — AB (ref 70–99)
GLUCOSE-CAPILLARY: 181 mg/dL — AB (ref 70–99)
Glucose-Capillary: 93 mg/dL (ref 70–99)
Glucose-Capillary: 117 mg/dL — ABNORMAL HIGH (ref 70–99)
Glucose-Capillary: 133 mg/dL — ABNORMAL HIGH (ref 70–99)
Glucose-Capillary: 168 mg/dL — ABNORMAL HIGH (ref 70–99)
Glucose-Capillary: 193 mg/dL — ABNORMAL HIGH (ref 70–99)
Glucose-Capillary: 201 mg/dL — ABNORMAL HIGH (ref 70–99)

## 2018-03-25 LAB — PROCALCITONIN: Procalcitonin: 1.13 ng/mL

## 2018-03-25 LAB — C3 COMPLEMENT: C3 COMPLEMENT: 155 mg/dL (ref 82–167)

## 2018-03-25 LAB — ANCA TITERS: C-ANCA: <1:20 {titer}

## 2018-03-25 LAB — AMMONIA: AMMONIA: 15 umol/L (ref 9–35)

## 2018-03-25 LAB — CYCLIC CITRUL PEPTIDE ANTIBODY, IGG/IGA
CCP Antibodies IgG/IgA: 7 units (ref 0–19)
CCP Antibodies IgG/IgA: 8 units (ref 0–19)

## 2018-03-25 LAB — RHEUMATOID FACTOR
RHEUMATOID FACTOR: 13.9 [IU]/mL (ref 0.0–13.9)
Rhuematoid fact SerPl-aCnc: 10.8 IU/mL (ref 0.0–13.9)

## 2018-03-25 LAB — GLOMERULAR BASEMENT MEMBRANE ANTIBODIES: GBM AB: 3 U (ref 0–20)

## 2018-03-25 LAB — TROPONIN I
TROPONIN I: 0.12 ng/mL — AB (ref <0.03)
TROPONIN I: 0.09 ng/mL — AB (ref <0.03)
Troponin I: 0.15 ng/mL (ref <0.03)

## 2018-03-25 LAB — ANTI-SCLERODERMA ANTIBODY: Scleroderma (Scl-70) (ENA) Antibody, IgG: <0.2 AI (ref 0.0–0.9)

## 2018-03-25 LAB — APTT: APTT: 39 s — AB (ref 24–36)

## 2018-03-25 LAB — PHOSPHORUS: Phosphorus: 3.3 mg/dL (ref 2.5–4.6)

## 2018-03-25 LAB — ANA: ANA: NEGATIVE

## 2018-03-25 LAB — ANA W/REFLEX: ANA: NEGATIVE

## 2018-03-25 LAB — TSH: TSH: 0.613 u[IU]/mL (ref 0.350–4.500)

## 2018-03-25 MED ORDER — DIPHENHYDRAMINE HCL 25 MG PO CAPS
50.0000 mg | ORAL_CAPSULE | Freq: Every evening | ORAL | Status: DC | PRN
  Filled 2018-03-25 (×2): qty 1

## 2018-03-25 MED ORDER — DEXTROSE 50 % IV SOLN
INTRAVENOUS | Status: AC
  Administered 2018-03-25: 25 mL via INTRAVENOUS
  Filled 2018-03-25: qty 50

## 2018-03-25 MED ORDER — VANCOMYCIN HCL IN DEXTROSE 1-5 GM/200ML-% IV SOLN
1000.0000 mg | INTRAVENOUS | Status: DC
  Administered 2018-03-26: 1000 mg via INTRAVENOUS
  Filled 2018-03-25 (×2): qty 200

## 2018-03-25 MED ORDER — SODIUM CHLORIDE 0.9 % IV BOLUS
500.0000 mL | Freq: Once | INTRAVENOUS | Status: AC
  Administered 2018-03-25: 500 mL via INTRAVENOUS

## 2018-03-25 MED ORDER — VANCOMYCIN HCL 10 G IV SOLR
1500.0000 mg | Freq: Once | INTRAVENOUS | Status: DC
  Administered 2018-03-25: 1500 mg via INTRAVENOUS
  Filled 2018-03-25: qty 1500

## 2018-03-25 MED ORDER — VANCOMYCIN HCL 10 G IV SOLR
1250.0000 mg | INTRAVENOUS | Status: DC
  Filled 2018-03-25: qty 1250

## 2018-03-25 MED ORDER — NEPRO/CARBSTEADY PO LIQD
237.0000 mL | Freq: Two times a day (BID) | ORAL | Status: DC
  Administered 2018-03-25 – 2018-03-27 (×2): 237 mL via ORAL

## 2018-03-25 MED ORDER — LACTATED RINGERS IV SOLN: INTRAVENOUS | Status: DC

## 2018-03-25 MED ORDER — AMIODARONE HCL 200 MG PO TABS
400.0000 mg | ORAL_TABLET | Freq: Two times a day (BID) | ORAL | Status: DC
  Administered 2018-03-25 – 2018-03-27 (×5): 400 mg via ORAL
  Filled 2018-03-25 (×5): qty 2

## 2018-03-25 MED ORDER — DEXTROSE 50 % IV SOLN
25.0000 mL | Freq: Once | INTRAVENOUS | Status: AC
  Administered 2018-03-25: 25 mL via INTRAVENOUS

## 2018-03-25 MED ORDER — DEXTROSE 5 % IV SOLN
10.0000 mg/kg | Freq: Two times a day (BID) | INTRAVENOUS | Status: DC
  Administered 2018-03-25 (×2): 795 mg via INTRAVENOUS
  Filled 2018-03-25 (×3): qty 15.9

## 2018-03-25 MED ORDER — SODIUM CHLORIDE 0.9% FLUSH: 10.0000 mL | INTRAVENOUS | Status: DC | PRN

## 2018-03-25 MED ORDER — MAGNESIUM SULFATE IN D5W 1-5 GM/100ML-% IV SOLN
1.0000 g | Freq: Once | INTRAVENOUS | Status: AC
  Administered 2018-03-25: 1 g via INTRAVENOUS
  Filled 2018-03-25: qty 100

## 2018-03-25 MED ORDER — SODIUM CHLORIDE 0.9 % IV SOLN
2.0000 g | Freq: Two times a day (BID) | INTRAVENOUS | Status: DC
  Administered 2018-03-25 – 2018-03-27 (×4): 2 g via INTRAVENOUS
  Filled 2018-03-25 (×5): qty 2

## 2018-03-25 MED ORDER — NOREPINEPHRINE 16 MG/250ML-% IV SOLN
0.0000 ug/min | INTRAVENOUS | Status: DC
  Administered 2018-03-25: 10 ug/min via INTRAVENOUS
  Filled 2018-03-25: qty 250

## 2018-03-25 MED ORDER — NOREPINEPHRINE 4 MG/250ML-% IV SOLN
INTRAVENOUS | Status: AC
  Filled 2018-03-25: qty 250

## 2018-03-25 MED ORDER — DEXAMETHASONE SODIUM PHOSPHATE 4 MG/ML IJ SOLN
10.0000 mg | Freq: Four times a day (QID) | INTRAMUSCULAR | Status: DC
  Administered 2018-03-25 (×4): 10 mg via INTRAVENOUS
  Filled 2018-03-25 (×4): qty 3

## 2018-03-25 MED ORDER — ACETAMINOPHEN 325 MG PO TABS
325.0000 mg | ORAL_TABLET | Freq: Once | ORAL | Status: AC
  Administered 2018-03-25: 325 mg via ORAL
  Filled 2018-03-25: qty 1

## 2018-03-25 MED ORDER — SODIUM CHLORIDE 0.9 % IV SOLN
1.0000 g | Freq: Two times a day (BID) | INTRAVENOUS | Status: DC
  Administered 2018-03-25: 1 g via INTRAVENOUS
  Filled 2018-03-25 (×3): qty 1

## 2018-03-25 MED ORDER — SODIUM CHLORIDE 0.9 % IV SOLN
1.0000 g | Freq: Four times a day (QID) | INTRAVENOUS | Status: DC
  Administered 2018-03-25: 1 g via INTRAVENOUS
  Filled 2018-03-25: qty 1000
  Filled 2018-03-25: qty 1
  Filled 2018-03-25 (×2): qty 1000

## 2018-03-25 MED ORDER — SODIUM CHLORIDE 0.9 % IV SOLN
0.0000 ug/min | INTRAVENOUS | Status: DC
  Administered 2018-03-25: 10 ug/min via INTRAVENOUS
  Administered 2018-03-25: 20 ug/min via INTRAVENOUS
  Filled 2018-03-25: qty 40

## 2018-03-25 NOTE — Significant Event (Signed)
Called to bedside by Terrence Dupont, pts RN, due to Altered mental status.  Pt previously had been awake and alert, no focal deficits.  Pt is now obtunded and somnolent, unable to follow commands, aphasic.  Orders placed for STAT Head CT without contrast and ABG.  Glucose was 83, so 1/2 amp D50 given.  Will call TeleNeurology when pt arrives back from CT.

## 2018-03-25 NOTE — Progress Notes (Signed)
[facility] NAME: Danny Cook    MR#:  [id number]  DATE OF BIRTH:  September 26, 1960  SUBJECTIVE:   Overnight patient had a unresponsive episode and thought to have a possible stroke, CT head was negative and full stroke work-up was initiated.  Patient was not given TPA.  He is more awake and alert today.  Also empirically started on antibiotics to treat meningitis/encephalitis.  Patient's headache is improved today, he denies any nausea or vomiting or any symptoms.  REVIEW OF SYSTEMS:    Review of Systems  Constitutional: Negative for chills and fever.  HENT: Negative for congestion and tinnitus.   Eyes: Negative for blurred vision and double vision.  Respiratory: Positive for shortness of breath. Negative for cough and wheezing.   Cardiovascular: Negative for chest pain, orthopnea and PND.  Gastrointestinal: Negative for abdominal pain, diarrhea, nausea and vomiting.  Genitourinary: Negative for dysuria and hematuria.  Neurological: Positive for dizziness and headaches. Negative for sensory change and focal weakness.  All other systems reviewed and are negative.   Nutrition: Heart Healthy/Carb modified Tolerating Diet: Yes Tolerating PT: Await Eval.    DRUG ALLERGIES:   Allergies  Allergen Reactions  . Trazodone And Nefazodone   . Viread [Tenofovir Disoproxil]     Damages kidneys  . Etodolac Rash    VITALS:  Blood pressure 125/71, pulse 91, temperature 99.4 F (37.4 C), temperature source Oral, resp. rate (!) 29, height 5\' 7"  (1.702 m), weight 100.2 kg, SpO2 92 %.  PHYSICAL EXAMINATION:   Physical Exam  GENERAL:  57 y.o.-year-old obese patient lying in bed in no acute distress.  EYES: Pupils equal, round, reactive to light and accommodation. No scleral icterus. Extraocular muscles intact.  HEENT: Head atraumatic, normocephalic. Oropharynx and nasopharynx clear.  NECK:  Supple, no jugular venous distention. No thyroid  enlargement, no tenderness.  LUNGS: Normal breath sounds bilaterally, no wheezing, rales, rhonchi. No use of accessory muscles of respiration.  CARDIOVASCULAR: S1, S2 normal. No murmurs, rubs, or gallops.  ABDOMEN: Soft, nontender, nondistended. Bowel sounds present. No organomegaly or mass.  EXTREMITIES: No cyanosis, clubbing, +1 edema b/l NEUROLOGIC: Cranial nerves II through XII are intact. No focal Motor or sensory deficits b/l. Globally weak   PSYCHIATRIC: The patient is alert and oriented x 3.  SKIN: No obvious rash, lesion, or ulcer.    LABORATORY PANEL:   CBC Recent Labs  Lab 03/25/18 0108  WBC 9.3  HGB 8.9*  HCT 25.3*  PLT 113*   ------------------------------------------------------------------------------------------------------------------  Chemistries  Recent Labs  Lab 03/25/18 0108  NA 138  K 3.5  CL 109  CO2 19*  GLUCOSE 100*  BUN 39*  CREATININE 2.84*  CALCIUM 6.9*  MG 1.9  AST 34  ALT 18  ALKPHOS 47  BILITOT 0.7   ------------------------------------------------------------------------------------------------------------------  Cardiac Enzymes Recent Labs  Lab 03/25/18 1344  TROPONINI 0.09*   ------------------------------------------------------------------------------------------------------------------  RADIOLOGY:  Ct Head Wo Contrast  Result Date: 03/25/2018 CLINICAL DATA:  Initial evaluation for acute altered mental status. EXAM: CT HEAD WITHOUT CONTRAST TECHNIQUE: Contiguous axial images were obtained from the base of the skull through the vertex without intravenous contrast. COMPARISON:  Prior CT from 07/31/2017. FINDINGS: Brain: Generalized age-related cerebral atrophy with mild chronic small vessel ischemic disease, stable. No acute intracranial hemorrhage. No acute large vessel territory infarct. No mass lesion, midline shift or mass effect no hydrocephalus. No extra-axial fluid collection. Vascular: No hyperdense vessel. Scattered  vascular calcifications noted within  the carotid siphons. Skull: Scalp soft tissues within normal limits.  Calvarium intact. Sinuses/Orbits: Globes and orbital soft tissues demonstrate no acute finding. Mild scattered mucosal thickening within the ethmoidal air cells. Paranasal sinuses are otherwise clear. No mastoid effusion. Other: None. IMPRESSION: 1. No acute intracranial abnormality. 2. Mild age-related cerebral atrophy with chronic small vessel ischemic disease, stable. Electronically Signed   By: [name] M.D.   On: 03/25/2018 03:03   US Renal  Result Date: 03/24/2018 CLINICAL DATA:  Acute renal failure. EXAM: RENAL / URINARY TRACT ULTRASOUND COMPLETE COMPARISON:  CT scan of February 27, 2018. FINDINGS: Right Kidney: Length: 11.4 cm. 11 mm nonobstructive calculus is seen in upper pole. Echogenicity within normal limits. No mass or hydronephrosis visualized. Left Kidney: Length: 11.9 cm. Echogenicity within normal limits. No mass or hydronephrosis visualized. Bladder: Appears normal for degree of bladder distention. Bilateral ureteral jets are noted. Mild ascites is noted. IMPRESSION: Nonobstructive right renal calculus. Mild ascites is noted. No other renal abnormality is noted. Electronically Signed   By: Marijo Conception, M.D.   On: 03/24/2018 13:44   Dg Chest Port 1 View  Result Date: 03/25/2018 CLINICAL DATA:  Acute respiratory distress EXAM: PORTABLE CHEST 1 VIEW COMPARISON:  Chest radiograph from one day prior. FINDINGS: Left internal jugular central venous catheter terminates at the cavoatrial junction. Stable cardiomediastinal silhouette with mild cardiomegaly. No pneumothorax. No right pleural effusion. Probable stable small left pleural effusion. No overt pulmonary edema. Low lung volumes with stable left retrocardiac opacity. IMPRESSION: 1. Stable mild cardiomegaly without overt pulmonary edema. 2. Probable stable small left pleural effusion. 3. Low lung volumes with stable left  retrocardiac opacity, favor atelectasis. Electronically Signed   By: Ilona Sorrel M.D.   On: 03/25/2018 01:39   Dg Chest Port 1 View  Result Date: 03/24/2018 CLINICAL DATA:  Central line placement EXAM: PORTABLE CHEST 1 VIEW COMPARISON:  Chest radiograph 03/24/2018 FINDINGS: Left IJ central venous catheter tip is at the cavoatrial junction. Cardiomegaly is unchanged. No focal airspace disease. No pneumothorax. IMPRESSION: Left IJ central venous catheter tip at the cavoatrial junction. No pneumothorax. Electronically Signed   By: Ulyses Jarred M.D.   On: 03/24/2018 14:56   Dg Chest Port 1 View  Result Date: 03/24/2018 CLINICAL DATA:  Recent attempted central line placement, evaluate for possible pneumothorax EXAM: PORTABLE CHEST 1 VIEW COMPARISON:  03/23/2018 FINDINGS: Cardiac shadow remains enlarged but accentuated by the portable technique. No pneumothorax is noted following filled central line placement. No bony abnormality is seen. IMPRESSION: No evidence of pneumothorax following fail attempt at central line placement. Electronically Signed   By: Inez Catalina M.D.   On: 03/24/2018 11:08   Dg Chest Port 1 View  Result Date: 03/23/2018 CLINICAL DATA:  Weakness and fatigue EXAM: PORTABLE CHEST 1 VIEW COMPARISON:  02/22/2018 FINDINGS: Stable cardiomegaly. Nonaneurysmal thoracic aorta. Mild elevation or eventration of the right hemidiaphragm. No acute pulmonary consolidation. No effusion or pneumothorax. No overt pulmonary edema. No acute nor suspicious osseous abnormalities. No free air beneath the diaphragm. IMPRESSION: Stable cardiomegaly.  No active pulmonary disease. Electronically Signed   By: Ashley Royalty M.D.   On: 03/23/2018 22:42   [place] Ekg Site Rite  Result Date: 03/24/2018 If Site Rite image not attached, placement could not be confirmed due to current cardiac rhythm.    ASSESSMENT AND PLAN:   57 year old male with past medical history of schizophrenia, history of HIV, obstructive sleep  apnea, CHF, CKD stage III, hypertension who presented  to the hospital due to dizziness and noted to be severely hypotensive and in acute on chronic renal failure.  1.  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  Baseline creatinine is around 1.13 as of August 2019. -He presents now with acute kidney injury suspected to be multifactorial in relation to increasing nonsteroidal use over the past month.  As per nephrology this is likely ATN due to hypotension and also in the setting of patient taking ACE inhibitors. - Continue IV fluid hydration, Bun/Cr improving. No acute indication for dialysis.  -Serologic work-up for underlying renal failure has been initiated per nephrology.  Renal ultrasound showing no evidence of obstruction.  2.  Sepsis-source unclear.  Patient presented to the hospital with severe hypotension, tachycardia. - Chest x-ray is negative for acute pathology, urinalysis negative.  Empirically patient is on IV vancomycin, cefepime.  Follow cultures which are (-) so far.   3.  Acute pericarditis- initially when patient presented to the hospital he was noted to have ST elevations consistent with a ST elevation MI but patient had no chest pain and his cardiac markers are not elevated. - As per cardiology this is likely related to volume overload/uremia unlikely viral pericarditis.   -  Patient's echocardiogram  showed a moderate pericardial effusion but no tamponade physiology. - Seen by cardiology and continue supportive care to treat underlying renal failure.    4.  Altered mental status/confusion-patient had an episode of altered mental status and unresponsiveness last night.  Stroke work-up was initiated.  CT head was negative, await MRI/MRA of the brain.  EEG was done today showing no evidence of seizures.  Etiology remains unclear.  Initially patient was started on broad-spectrum antibiotics to treat underlying meningitis/encephalitis but those  have been narrowed down now. - Await further neurology input.  Continue supportive care with antibiotics as mentioned above.  Await further neurologic imaging.  5.  Hypotension-in the setting of sepsis/volume loss. -Continue IV fluids, Neo-Synephrine, Levophed.  6.  History of paranoid schizophrenia/bipolar disorder-continue Depakote.  7.  Depression-continue Effexor.  8.  A. fib with RVR- off amiodarone drip, switch to oral amiodarone. - Defer anticoagulation for now.  Continue aspirin.  9.  History of HIV-cont. HAART.  -Last cd4 612 and Vl 20. Appreciate ID Input.    All the records are reviewed and case discussed with Care Management/Social Worker. Management plans discussed with the patient, family and they are in agreement.  CODE STATUS: Full code  DVT Prophylaxis: Hep SQ  TOTAL TIME TAKING CARE OF THIS PATIENT: 30 minutes.   POSSIBLE D/C IN 3-4 DAYS, DEPENDING ON CLINICAL CONDITION.   Henreitta Leber M.D on 03/25/2018 at 4:01 PM  Between 7am to 6pm - Pager - [phone]  After 6pm go to [email or web address] - [facility]  Office  [phone]  CC: Primary care physician; Katheren Shams

## 2018-03-25 NOTE — Progress Notes (Addendum)
Pharmacy Antibiotic Note  Galan Ghee. is a 57 y.o. male admitted on 03/23/2018 with meningitis.  Pharmacy has been consulted for vanc/ceftazidime/acyclovir dosing. Patient was previously on vanc 1g IV q24h 09/25 then d/c'd. Last dose was vanc 1g @ 1111 09/25. Pharmacy reconsulted for vanc for meningitis and patient was given vanc 1.5g IV x 1 16 hours post last dose.  Plan: Since patient has previously been on vanc and received a 1.5g IV x 1 tonight, will hold off on a maintenance regimen and will draw a 24 hour random level to assess. Renal function appears to be improving 4.52 >> 3.92 >> 2.84, UOP 1L. Will continue to monitor.  Ke 0.0306  T1/2 24 hours Goal random < 20 mcg/mL Goal trough 15 - 20 mcg/mL  Will start ceftazidime 1g IV q12h and acyclovir 795 mg IV q12h (10 mg/kg per adjusted body weight)  Height: 5\' 7"  (170.2 cm) Weight: 221 lb (100.2 kg) IBW/kg (Calculated) : 66.1  Temp (24hrs), Avg:99.9 F (37.7 C), Min:98.2 F (36.8 C), Max:101.3 F (38.5 C)  Recent Labs  Lab 03/23/18 1847 03/23/18 2341 03/24/18 0204 03/25/18 0108  WBC 11.3*  --  11.9* 9.3  CREATININE 4.52*  --  3.92* 2.84*  LATICACIDVEN  --  1.3 1.9  --     Estimated Creatinine Clearance: 31.6 mL/min (A) (by C-G formula based on SCr of 2.84 mg/dL (H)).    Allergies  Allergen Reactions  . Trazodone And Nefazodone   . Viread [Tenofovir Disoproxil]     Damages kidneys  . Etodolac Rash    Thank you for allowing pharmacy to be a part of this patient's care.  Tobie Lords, PharmD, BCPS Clinical Pharmacist 03/25/2018

## 2018-03-25 NOTE — Progress Notes (Signed)
[facility] is recommending CTA of Head and Neck.  Called and discussed with Dr. Candiss Norse of Nephrology given pt's AKI.  Per Dr. Candiss Norse, it if is medically necessary, then proceed with CTA, and will follow kidney function.  Darel Hong, AGACNP-BC [facility] Pager: [phone]

## 2018-03-25 NOTE — Progress Notes (Signed)
[facility] Cardiology  SUBJECTIVE: Patient laying in bed, denies chest pain or shortness of breath   Vitals:   03/25/18 0430 03/25/18 0500 03/25/18 0530 03/25/18 0630  BP: (!) 114/57 116/66 111/65 (!) 110/59  Pulse: 80 88 85 86  Resp: (!) 21 (!) 30 (!) 22 (!) 28  Temp: 98.2 F (36.8 C)     TempSrc: Oral     SpO2: 97% 96% 96% 97%  Weight:      Height:         Intake/Output Summary (Last 24 hours) at 03/25/2018 1248 Last data filed at 03/25/2018 0600 Gross per 24 hour  Intake 3781.06 ml  Output 1350 ml  Net 2431.06 ml      PHYSICAL EXAM  General: Well developed, well nourished, in no acute distress HEENT:  Normocephalic and atramatic Neck:  No JVD.  Lungs: Clear bilaterally to auscultation and percussion. Heart: HRRR . Normal S1 and S2 without gallops or murmurs.  Abdomen: Bowel sounds are positive, abdomen soft and non-tender  Msk:  Back normal, normal gait. Normal strength and tone for age. Extremities: No clubbing, cyanosis or edema.   Neuro: Alert and oriented X 3. Psych:  Good affect, responds appropriately   LABS: Basic Metabolic Panel: Recent Labs    03/24/18 0204 03/25/18 0108  NA 140 138  K 3.3* 3.5  CL 105 109  CO2 22 19*  GLUCOSE 72 100*  BUN 41* 39*  CREATININE 3.92* 2.84*  CALCIUM 7.6* 6.9*  MG 2.0 1.9  PHOS  --  3.3   Liver Function Tests: Recent Labs    03/23/18 1847 03/25/18 0108  AST 22 34  ALT 13 18  ALKPHOS 73 47  BILITOT 0.6 0.7  PROT 7.5 6.0*  ALBUMIN 3.5 2.6*   Recent Labs    03/23/18 1847  LIPASE 34   CBC: Recent Labs    03/24/18 0204 03/25/18 0108  WBC 11.9* 9.3  NEUTROABS  --  6.1  HGB 10.8* 8.9*  HCT 32.1* 25.3*  MCV 94.6 92.2  PLT 140* 113*   Cardiac Enzymes: Recent Labs    03/24/18 0204 03/24/18 0731 03/25/18 0539 03/25/18 0902  CKTOTAL  --  877*  --   --   TROPONINI 0.03*  --  0.15* 0.12*   BNP: Invalid input(s): POCBNP D-Dimer: No results for input(s): DDIMER in the last 72 hours. Hemoglobin  A1C: No results for input(s): HGBA1C in the last 72 hours. Fasting Lipid Panel: No results for input(s): CHOL, HDL, LDLCALC, TRIG, CHOLHDL, LDLDIRECT in the last 72 hours. Thyroid Function Tests: Recent Labs    03/25/18 0108  TSH 0.613   Anemia Panel: No results for input(s): VITAMINB12, FOLATE, FERRITIN, TIBC, IRON, RETICCTPCT in the last 72 hours.  Ct Head Wo Contrast  Result Date: 03/25/2018 CLINICAL DATA:  Initial evaluation for acute altered mental status. EXAM: CT HEAD WITHOUT CONTRAST TECHNIQUE: Contiguous axial images were obtained from the base of the skull through the vertex without intravenous contrast. COMPARISON:  Prior CT from 07/31/2017. FINDINGS: Brain: Generalized age-related cerebral atrophy with mild chronic small vessel ischemic disease, stable. No acute intracranial hemorrhage. No acute large vessel territory infarct. No mass lesion, midline shift or mass effect no hydrocephalus. No extra-axial fluid collection. Vascular: No hyperdense vessel. Scattered vascular calcifications noted within the carotid siphons. Skull: Scalp soft tissues within normal limits.  Calvarium intact. Sinuses/Orbits: Globes and orbital soft tissues demonstrate no acute finding. Mild scattered mucosal thickening within the ethmoidal air cells. Paranasal sinuses are otherwise  clear. No mastoid effusion. Other: None. IMPRESSION: 1. No acute intracranial abnormality. 2. Mild age-related cerebral atrophy with chronic small vessel ischemic disease, stable. Electronically Signed   By: [name] M.D.   On: 03/25/2018 03:03   US Renal  Result Date: 03/24/2018 CLINICAL DATA:  Acute renal failure. EXAM: RENAL / URINARY TRACT ULTRASOUND COMPLETE COMPARISON:  CT scan of February 27, 2018. FINDINGS: Right Kidney: Length: 11.4 cm. 11 mm nonobstructive calculus is seen in upper pole. Echogenicity within normal limits. No mass or hydronephrosis visualized. Left Kidney: Length: 11.9 cm. Echogenicity within  normal limits. No mass or hydronephrosis visualized. Bladder: Appears normal for degree of bladder distention. Bilateral ureteral jets are noted. Mild ascites is noted. IMPRESSION: Nonobstructive right renal calculus. Mild ascites is noted. No other renal abnormality is noted. Electronically Signed   By: Marijo Conception, M.D.   On: 03/24/2018 13:44   Dg Chest Port 1 View  Result Date: 03/25/2018 CLINICAL DATA:  Acute respiratory distress EXAM: PORTABLE CHEST 1 VIEW COMPARISON:  Chest radiograph from one day prior. FINDINGS: Left internal jugular central venous catheter terminates at the cavoatrial junction. Stable cardiomediastinal silhouette with mild cardiomegaly. No pneumothorax. No right pleural effusion. Probable stable small left pleural effusion. No overt pulmonary edema. Low lung volumes with stable left retrocardiac opacity. IMPRESSION: 1. Stable mild cardiomegaly without overt pulmonary edema. 2. Probable stable small left pleural effusion. 3. Low lung volumes with stable left retrocardiac opacity, favor atelectasis. Electronically Signed   By: Ilona Sorrel M.D.   On: 03/25/2018 01:39   Dg Chest Port 1 View  Result Date: 03/24/2018 CLINICAL DATA:  Central line placement EXAM: PORTABLE CHEST 1 VIEW COMPARISON:  Chest radiograph 03/24/2018 FINDINGS: Left IJ central venous catheter tip is at the cavoatrial junction. Cardiomegaly is unchanged. No focal airspace disease. No pneumothorax. IMPRESSION: Left IJ central venous catheter tip at the cavoatrial junction. No pneumothorax. Electronically Signed   By: Ulyses Jarred M.D.   On: 03/24/2018 14:56   Dg Chest Port 1 View  Result Date: 03/24/2018 CLINICAL DATA:  Recent attempted central line placement, evaluate for possible pneumothorax EXAM: PORTABLE CHEST 1 VIEW COMPARISON:  03/23/2018 FINDINGS: Cardiac shadow remains enlarged but accentuated by the portable technique. No pneumothorax is noted following filled central line placement. No bony  abnormality is seen. IMPRESSION: No evidence of pneumothorax following fail attempt at central line placement. Electronically Signed   By: Inez Catalina M.D.   On: 03/24/2018 11:08   Dg Chest Port 1 View  Result Date: 03/23/2018 CLINICAL DATA:  Weakness and fatigue EXAM: PORTABLE CHEST 1 VIEW COMPARISON:  02/22/2018 FINDINGS: Stable cardiomegaly. Nonaneurysmal thoracic aorta. Mild elevation or eventration of the right hemidiaphragm. No acute pulmonary consolidation. No effusion or pneumothorax. No overt pulmonary edema. No acute nor suspicious osseous abnormalities. No free air beneath the diaphragm. IMPRESSION: Stable cardiomegaly.  No active pulmonary disease. Electronically Signed   By: Ashley Royalty M.D.   On: 03/23/2018 22:42   [place] Ekg Site Rite  Result Date: 03/24/2018 If Site Rite image not attached, placement could not be confirmed due to current cardiac rhythm.    Echo LVEF 55 to 65%, with small to moderate pericardial effusion, predominant posterior location with less than 1 cm at the apex, without apparent hemodynamic compromise  TELEMETRY: Sinus rhythm:  ASSESSMENT AND PLAN:  Principal Problem:   Pericarditis with effusion Active Problems:   Chronic diastolic heart failure (HCC)   HTN (hypertension)   Diabetes ([place])   Schizoaffective  disorder, depressive type ([place])   HIV positive ([place])   AKI (acute kidney injury) ([place])    1.  Abnormal ECG, with PR elevation in aVR, PR depression and upsloping ST elevation in inferior leads, most consistent with acute pericarditis, in the absence of chest pain, borderline elevated troponin, likely nonischemic 2.  Probable acute pericarditis, with mild to moderate pericardial effusion, without evidence for hemodynamic compromise, of uncertain clinical significance, in the setting of renal failure due to ATN as a result of low blood pressure and recent ACE inhibitor therapy. 3.  Persistent hypotension, on Neo-Synephrine and levophed 4.   Paroxysmal atrial fibrillation, converted to sinus rhythm, on amiodarone drip 5.  Cute on chronic renal failure, probable ATN  Recommendations  1.  Agree with overall current therapy 2.  Transition IV to oral amiodarone 3.  Consult Dr. Genevive Bi to consider diagnostic +- therapeutic pericardial window 4.  Defer cardiac catheterization   Isaias Cowman, MD, PhD, [facility] 03/25/2018 12:48 PM

## 2018-03-25 NOTE — Progress Notes (Signed)
Danny Cook. is a 57 y.o. male with history of HIV, CHF, chronic kidney disease] focal sclerosis glomerular nephritis is admitted with dizziness, nausea, headaches for the past few days. As per patient he had a headache for the past month or so. Recently he started to feel dizzy along with nausea. He also felt hot but did not check his temperature. He went to his PCPs office on 03/23/2018 and his blood pressure was 100/68, heart rate of 107, oxygen saturation of 92%. He received a injection of Toradol 30 mg IM for headache and was asked to hold the Jardiance and Lasix and was asked to go to the emergency department . In the ED was found to have a creatinine of 4.52, anion gap of 14, bicarb of 21 his temperature was 98.2 blood pressure was 66/36. Cultures were sent he also had an EKG which showed changes in the inferior and anterior leads suggestive of an MI and cardiology was consulted they thought it could be due to pericarditis he also had an echo at bedside which shows pericardial fluid. Patient does not have any chest pain. He has been short of breath for the past few weeks.  He has presented to the ED quite a few times in August. On August 3 it was for jaw pain .on August 4 he presented for intractable headache. During that visit his vitals were blood pressure 130/74 heart rate of 105 and temperature of 98.5 CD4 was done on that date and it was 612 with 36%. He was diagnosed as migraine. He was discharged on Fioricet. On August 26 his creatinine was 1.18. He was started on Jardiance which is a DPP 4 by his endocrinologist on August 28. He presented to [facility] ED on August 31 complaining of lightheadedness and generalized weakness. His creatinine from August 31st was 2.86. He was given IV fluids and discharged. He saw his PCP on 9/24 /2019 and was asked to get admitted.  I am asked to see the patient for HIV.  He is currently in ICU and is getting pressors for low blood pressure. He had transient  A. fib and was given amiodarone he has reversed back to sinus rhythm.  He has been seen by cardiologist, nephrologist, intensivist and hospitalist. He is also on vancomycin and cefepime.  Patient states he has felt hot but never checked his temperature.  He has had a chronic cough for almost a month but nonproductive.  He has been short of breath on minimal exertion.  He also has orthopnea and especially when he lies flat he has cough and gets short of breath.  Patient states he was diagnosed with HIV many years ago on a routine blood test which was done as he was donating blood.   Subjective Last night he was less responsive and a code S was called and he was given decadron 36m and also antibiotic expanded to cover meningitis and encephalitis. He is fine this morning- awake and alert and sitting and taling  OBJECTIVE:  BP 102/75   Pulse 71   Temp 98.3 F (36.8 C) (Oral)   Resp 20   Ht 5' 7"  (1.702 m)   Wt 100.2 kg   SpO2 94%   BMI 34.61 kg/m    Physical Exam  Constitutional: Well-nourished, no obvious distress,  Eyes: Pupils reacting to light  ENT: Oral cavity normal with no exudate, no nasal congestion, no sinus tenderness  CVS: S1-S2 tachycardia  RS: Bilateral air entry  GI: Abdomen  soft distention no tenderness  GU  MSK moves all limbs, ankle edema, no joint swelling  SKIN: No rash  Neurologic: Nonfocal examination cranial nerves intact  Psychiatric: Normal affect  Lymphatic: No adenopathy palpated  Lab Results  CBC Latest Ref Rng & Units 03/25/2018 03/24/2018 03/23/2018  WBC 3.8 - 10.6 K/uL 9.3 11.9(H) 9.8  Hemoglobin 13.0 - 18.0 g/dL 8.9(L) 10.8(L) 9.5(L)  Hematocrit 40.0 - 52.0 % 25.3(L) 32.1(L) 29.5(L)  Platelets 150 - 440 K/uL 113(L) 140(L) 156   CMP Latest Ref Rng & Units 03/25/2018 03/24/2018 03/23/2018  Glucose 70 - 99 mg/dL 100(H) 72 163(H)  BUN 6 - 20 mg/dL 39(H) 41(H) 41(H)  Creatinine 0.61 - 1.24 mg/dL 2.84(H) 3.92(H) 4.52(H)  Sodium 135 - 145 mmol/L 138 140  136  Potassium 3.5 - 5.1 mmol/L 3.5 3.3(L) 4.3  Chloride 98 - 111 mmol/L 109 105 101  CO2 22 - 32 mmol/L 19(L) 22 21(L)  Calcium 8.9 - 10.3 mg/dL 6.9(L) 7.6(L) 8.3(L)  Total Protein 6.5 - 8.1 g/dL 6.0(L) - 7.5  Total Bilirubin 0.3 - 1.2 mg/dL 0.7 - 0.6  Alkaline Phos 38 - 126 U/L 47 - 73  AST 15 - 41 U/L 34 - 22  ALT 0 - 44 U/L 18 - 13      Assessment and Plan  57 y.o. male with history of HIV, CHF, chronic kidney disease] focal sclerosis glomera nephritis is admitted with dizziness, nausea, headaches for the past few days. As per patient he had a headache for the past month or so. Recently he started to feel dizzy along with nausea. He also felt hot but did not check his temperature. He went to his PCPs office on 03/23/2018 and was and his blood pressure was 100/68, heart rate of 107, oxygen saturation of 92%. He received a injection of Toradol 30 mg IM for headache and was asked to hold the Jardiance and Lasix and was asked to go to the emergency department .   Fever- clinically this is not meningitis or encephalitis- DC acyclovir and ampicillin  Pericardial effusion- unclear etiology- if window is being performed will need to send fluid for AFB, bacterial culture and cell count, pathology of pericardial tissue  Headache, nausea, shortness of breath Not sure what the reason of his kidney failure. But there are only some medications which could have aggravated the condition and also with underlying renal failure could have caused side effects including metformin, Jardiance.  Also he is on Triumeq for HIV which is a fixed drug combination of dolutegravir, Epivir and abacavir. The Epivir dose will have to be adjusted for creatinine clearance . Will change the single pill to its separate components   AKI on CKD  Avoid nephrotoxic drug   Hypotension: Likely related to hypovolemia, medications. Pericardial effusion/tamponade was being considered  Sepsis is a concern but clincially no source  unless the pericardial fluid is infected  Procalcitonin is mildly elevated but that could be high in kidney failure.    Pericardial effusion- - uremic pericardial effusion VS other causes including infection/autoimmune.  ESR is high > 130 -with headache Temporal arteritis a concern but no temporal tenderness . Will repeat ESR   HIV- well controlled -says he never had KS.  Last cd4 612 and Vl 20- adjusted the dose of his HAARt  DM- was on multiple meds- GLP A1, SGLTP2, Metformin, insulin   CHF  HTN- lasix, lisinopril  Discussed the management with the patient and care team  Tsosie Billing, MD

## 2018-03-25 NOTE — Consult Note (Addendum)
Reportedly there was an improvement in patients mental status and now he is more easily arousable with no focal deficits.  Can hold off on urgent head and neck CTA. Should get routine brain MRI, head MRA without contrast and Carotid Ultrasound.  /////////////////////////////////  Date of Service 03/25/2018  [facility] Consult Services  Comments: Last time known well:  _ 2:30 Door time:  _ inpatient [facility] contacted: _239 [facility] at bedside: _244 NIHSS assessment time: _249 consult end time: _308 --------------------------------------------------------------------------- Impression:  obtundation, etiology is unclear but given nonfocal examination it is most likely toxic metabolic  Does meet Large Vessel Occlusion (LVO) screening criteria (Aphasia, Neglect, Gaze deviation/preference, Dense hemiparesis, or Visual field deficits on exam), therefore advanced imaging (CTA head and neck and CTP brain) is indicated.   Differential Diagnosis:  1. Cardioembolic stroke  2. Small vessel disease/ lacune  3. Thromboembolic, artery-to-artery mechanism  4. Hypercoagulable state-related infarct  5. Transient ischemic attack  6. Thrombotic mechanism, large artery disease   ------------------------------------------------------------------------------------------- tPA decision and other recommendations:   _ Patient is not a tPA candidate Head CT did not show any acute hemorrhage. reviewed report (if available) and images  Reason: _ nonfocal exam, only obtunded which is more likely to be toxic metabolic. Based on the results of CTA head and neck and (if needed) CTP brain, will determine presence of Large Vessel Occlusion and eligibility for mechanical thrombectomy.  Recommendations dysphagia screen ASA if no contraindications head of bed flat IV fluids NS Stroke work up with: noncontrast brain MRI, 2D ECHO, lipid panel, HbA1c (Goal LDL<70,  HbA1c<7)  Consider EEG  inpatient neurology consultation Inpatient stroke evaluation as per Neurology/ Internal Medicine Discussed with ED physician/medical staff Please contact [facility] Navigator to reach me if further questions/concerns arise.   -------------------------------------------------------------------------------------------- Reason for Stroke Alert and History of Present Illness:  _ Patient is a(n) 57 years old  male, with history of pericarditis, acute renal failure, hypotension, sepsis, Atrial Fibrillation last known well: 230  was noticed to become obtunded.  ---------------------------------------------------------------------------------------------- Review of Systems:  Constitutional:  Negative except as documented in history of present illness.   Eye:  Negative except as documented in history of present illness.   Ear/Nose/Mouth/Throat:  Negative except as documented in history of present illness.   Respiratory:  Negative except as documented in history of present illness.   Cardiovascular:  Negative except as documented in history of present illness.   Gastrointestinal:  Negative except as documented in history of present illness.   Musculoskeletal:  Negative except as documented in history of present illness.   Neurologic:  Negative except as documented in history of present illness.    --------------------------------------------------------------------------------------------------- Examination:   NIHSS Details documented in the note ___ 8 Deficits nonfocal and likely due to obtundation.  # NIHSS - NIH STROKE SCALE - 11 items:  1A: Level of consciousness: 2 - Requires repeated stimulation to arouse; Or Movements to pain.  1B: Ask month and age: 36 - Both questions right  1C: 'Blink eyes' & 'squeeze hands': 0 - Performs both tasks  2: Horizontal extraocular movements: 0 - Normal.  3: Visual fields: 0 - No visual loss.  4: Facial palsy: 0  - Normal symmetry.  5A: Left arm motor drift: 0 - No drift for 10 seconds, or Amputation/joint fusion.  5B: Right arm motor drift: 0 - No drift for 10 seconds, or Amputation/joint fusion.  6A: Left leg motor drift: 3 - No effort against gravity.  6B: Right leg motor  drift: 3 - No effort against gravity.  7: Limb Ataxia: 0 - No ataxia, or does not understand, or Paralyzed, or Amputation/joint fusion.  8: Sensation: 0 - Normal; no sensory loss.  9: Language/aphasia: 0 - Normal; no aphasia.  10: Dysarthria: 0 - Normal, or Intubated/unable to test  11: Extinction/inattention: 0 - No abnormality   == Score: 8.  ------------------------------------------------------------------------------------------------------- Medical Decision Making:  - Extensive number of diagnosis or management options are considered above.   - Extensive amount of complex data reviewed.   - High risk of complication and/or morbidity or mortality are associated with differential diagnostic considerations above.  - There may be Uncertain outcome and increased probability of prolonged functional impairment or high probability of severe prolonged functional impairment associated with some of these differential diagnoses.  Medical Data Reviewed:  1.Data reviewed include clinical labs, radiology, Medical Tests;   2.Tests results discussed w/performing or interpreting physician;   3.Obtaining/reviewing old medical records;  4.Obtaining case history from another source;  5.Independent review of image, tracing or specimen.    When possible Patient/family were informed the Neurology Consult would happen via TeleHealth consult by way of interactive audio and video telecommunications and consented to receiving care in this manner.  Case discussed with the Medical staff.  Critical Care notation:  I was called to see this critical patient emergently. I personally evaluated this critical patient for acute stroke  evaluation and determining their eligibility for IV Alteplase and interventional therapies. I have spent approximately 24__ minutes with the patient, including time at bedside, time discussing the case with other physicians, reviewing plan of care, and time independently reviewing the records and scans.

## 2018-03-25 NOTE — Progress Notes (Signed)
Pt has arrived back from CT.  [facility] is evaluating pt.  Will follow their recommendations.

## 2018-03-25 NOTE — Progress Notes (Signed)
*  PRELIMINARY RESULTS* Echocardiogram 2D Echocardiogram has been performed.  Danny Cook 03/25/2018, 1:31 PM

## 2018-03-25 NOTE — Progress Notes (Signed)
.   0215 I could hear patient snoring hard and went to room to check on pt now he was obtunded and somnolent, speech was slurred. Pt has been previously alert and  Oriented. Now pt unable to follow any command expect deep pain stimulation. NP was called and pt was taken to stat CT head. Upon return to room code stroke was activated. See neurology progress note.

## 2018-03-25 NOTE — Consult Note (Signed)
Referring Physician: Henreitta Leber, MD    Chief Complaint: Altered mental status  HPI: Danny Cook. is an 57 y.o. male with multiple medical problems as below presenting to the ED as a referral from PCPs office on 03/23/2018 with persistent headache, dizziness, generalized weakness, decreased p.o. intake for the past 3 to 4 days.  At his PCPs office he was noted to have a blood pressure of 100/68, tachycardic, with a low oxygen saturation.  He received a Toradol injection 30 mg IM for headache at his PCPs office and was sent to the ED for further evaluation.  Initial work-up in the ED revealed abnormal kidney function with elevated creatinine of 4.52, he was also hypotensive with orthostatic symptoms.  He had an ultrasound which shows pericarditis with a small to moderate pericardial effusion without evidence of tamponade.  Cardiology was consulted and patient admitted to the ICU for further management.  While admitted in the ICU on 03/24/2018, patient was noted to have altered mental status which was a change from baseline with no focal deficit.  He appeared obtunded and somnolent, unable to follow commands and somewhat aphasic. Code stroke was called and patient was evaluated by tele-neurologist.  Initial CT head did not show acute intracranial abnormality.  CTA head and neck was recommended however due to patient's decreased kidney function and improving mental status patient did not get the imaging.  Per ICU report, patient remains lethargic but arouses easily to voice and able to follow commands.  Aphasia also resolved with no focality noted therefore patient was deemed not a candidate for TPA.  Initial NIH stroke scale of 8.  He was started on empiric treatment with vancomycin, ceftazidime and acyclovir for possible meningitis. He is currently on vaso active drip for MAP>65.  Date last known well: Date: 03/25/2018 Time last known well: Time: 02:30 tPA Given: No: Rapidly improving  symptoms with nonfocal exam  Past Medical History:  Diagnosis Date  . Anemia   . Anxiety   . Arthritis   . CHF (congestive heart failure) ([place])   . Chronic kidney disease    Renal Insufficiency Syndrome; Glomerulosclerosis 2013  . Complication of anesthesia   . Depressed   . Diabetes mellitus without complication ([place])   . High cholesterol   . HIV (human immunodeficiency virus infection) ([place])   . Hypertension   . 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 Paranoid disorder ([place])   . Schizophrenia, paranoid ([place])   . Sleep apnea     Past Surgical History:  Procedure Laterality Date  . COLONOSCOPY WITH PROPOFOL N/A 09/03/2015   Procedure: COLONOSCOPY WITH PROPOFOL;  Surgeon: Lollie Sails, MD;  Location: [facility] ENDOSCOPY;  Service: Endoscopy;  Laterality: N/A;  . DG TEETH FULL    . ESOPHAGOGASTRODUODENOSCOPY (EGD) WITH PROPOFOL N/A 07/01/2016   Procedure: ESOPHAGOGASTRODUODENOSCOPY (EGD) WITH PROPOFOL;  Surgeon: Lollie Sails, MD;  Location: [facility] ENDOSCOPY;  Service: Endoscopy;  Laterality: N/A;    Family History  Problem Relation Age of Onset  . Heart attack Mother   . Diabetes Mellitus II Mother   . Mental illness Mother   . CAD Mother   . Heart attack Father   . CAD Father   . Hypertension Father    Social History:  reports that he has never smoked. He has never used smokeless tobacco. He reports that he does not drink alcohol or use drugs.  Allergies:  Allergies  Allergen Reactions  . Trazodone And Nefazodone   . Viread [  Tenofovir Disoproxil]     Damages kidneys  . Etodolac Rash    Medications:  I have reviewed the patient's current medications. Prior to Admission:  Medications Prior to Admission  Medication Sig Dispense Refill Last Dose  . Abacavir-Dolutegravir-Lamivud (TRIUMEQ) 600-50-300 MG TABS Take 1 tablet by mouth daily.   01/30/2018 at 0830  . aspirin 325 MG tablet Take 325 mg by mouth daily.   01/30/2018 at 0830  . butalbital-acetaminophen-caffeine (FIORICET,  ESGIC) 50-325-40 MG tablet Take 1-2 tablets by mouth every 6 (six) hours as needed. 20 tablet 0   . clonazePAM (KLONOPIN) 1 MG tablet Take 0.5-1 mg by mouth 3 (three) times daily. Take 1 mg in the morning, 0.5 mg mid-day and 1 mg in the evening.   01/30/2018 at 0830  . cyanocobalamin 1000 MCG tablet Take 1,000 mcg by mouth daily.   01/30/2018 at 0830  . diphenhydrAMINE (BENADRYL) 50 MG capsule Take 1 capsule (50 mg total) by mouth at bedtime as needed for sleep. 30 capsule 0 PRN at PRN  . divalproex (DEPAKOTE ER) 250 MG 24 hr tablet Take 500 mg by mouth 2 (two) times daily.    01/30/2018 at 0830  . FANAPT 12 MG TABS Take 12 mg by mouth 2 (two) times daily.    01/30/2018 at 0830  . furosemide (LASIX) 20 MG tablet Take 10 mg by mouth daily.   01/30/2018 at 0830  . gabapentin (NEURONTIN) 300 MG capsule Take 300 mg by mouth 3 (three) times daily.   01/30/2018 at 0830  . gemfibrozil (LOPID) 600 MG tablet Take 1 tablet by mouth 2 (two) times daily.   01/30/2018 at 0830  . Insulin Degludec (TRESIBA FLEXTOUCH [place]) Inject 94 Units into the skin daily.   01/29/2018 at 0800  . liraglutide (VICTOZA) 18 MG/3ML SOPN Inject 1.8 mg into the skin daily.   01/29/2018 at 0800  . lisinopril (PRINIVIL,ZESTRIL) 20 MG tablet Take 20 mg by mouth daily.   01/30/2018 at 0830  . metFORMIN (GLUCOPHAGE) 1000 MG tablet Take 1,000 mg by mouth 2 (two) times daily with a meal.   01/30/2018 at 0830  . mirtazapine (REMERON) 30 MG tablet Take 30 mg by mouth at bedtime.   01/29/2018 at 2000  . omeprazole (PRILOSEC) 40 MG capsule Take 40 mg by mouth 2 (two) times daily.   01/30/2018 at 0830  . promethazine (PHENERGAN) 6.25 MG/5ML syrup Take 6.25 mg by mouth every 6 (six) hours as needed for nausea or vomiting.   PRN at PRN  . rosuvastatin (CRESTOR) 20 MG tablet Take 20 mg by mouth daily.   01/30/2018 at 0830  . sucralfate (CARAFATE) 1 g tablet Take 1 g by mouth 2 (two) times daily.   01/30/2018 at 0830  . triamcinolone cream (KENALOG) 0.1 % Apply 1 application  topically 2 (two) times daily as needed. 30 g 0 PRN at PRN  . venlafaxine XR (EFFEXOR-XR) 75 MG 24 hr capsule Take 1 capsule (75 mg total) by mouth daily with breakfast. 30 capsule 0 01/30/2018 at 0830  . Insulin Degludec 200 UNIT/ML SOPN Inject 20 Units into the skin daily. (Patient not taking: Reported on 01/30/2018)   Not Taking at Unknown time  . meclizine (ANTIVERT) 25 MG tablet Take 1 tablet (25 mg total) by mouth 3 (three) times daily as needed for dizziness or nausea. (Patient not taking: Reported on 03/23/2018) 30 tablet 1 Not Taking at Unknown time  . ondansetron (ZOFRAN ODT) 4 MG disintegrating tablet Take 1 tablet (4  mg total) by mouth every 8 (eight) hours as needed for nausea or vomiting. (Patient not taking: Reported on 03/23/2018) 20 tablet 0 Not Taking at Unknown time   Scheduled: . abacavir  600 mg Oral Daily  . aspirin EC  325 mg Oral Daily  . dexamethasone  10 mg Intravenous Q6H  . divalproex  500 mg Oral BID  . docusate sodium  100 mg Oral Daily  . dolutegravir  50 mg Oral Daily  . iloperidone  12 mg Oral BID  . insulin aspart  0-9 Units Subcutaneous Q4H  . lamiVUDine  100 mg Oral Daily  . pantoprazole  40 mg Oral BID  . rosuvastatin  20 mg Oral Daily  . venlafaxine XR  75 mg Oral Q breakfast    ROS: History obtained from the patient   General ROS: negative for - chills, fatigue, fever, night sweats, weight gain or weight loss Psychological ROS: positive for - behavioral disorder, hallucinations, paranoia, mood swings. Negative for suicidal ideation Ophthalmic ROS: negative for - blurry vision, double vision, eye pain or loss of vision ENT ROS: negative for - epistaxis, nasal discharge, oral lesions, sore throat, tinnitus or vertigo Allergy and Immunology ROS: negative for - hives or itchy/watery eyes Hematological and Lymphatic ROS: negative for - bleeding problems, bruising or swollen lymph nodes Endocrine ROS: negative for - galactorrhea, hair pattern changes,  polydipsia/polyuria or temperature intolerance Respiratory ROS: Positive for -  shortness of breath or wheezing. Negative for cough, hemoptysis, Cardiovascular ROS: negative for - chest pain, dyspnea on exertion, edema or irregular heartbeat Gastrointestinal ROS: negative for - abdominal pain, diarrhea, hematemesis, nausea/vomiting or stool incontinence Genito-Urinary ROS: negative for - dysuria, hematuria, incontinence or urinary frequency/urgency Musculoskeletal ROS: negative for - joint swelling or muscular weakness Neurological ROS: as noted in HPI Dermatological ROS: negative for rash and skin lesion changes  Physical Exam   Vitals Blood pressure (!) 110/59, pulse 86, temperature 98.2 F (36.8 C), temperature source Oral, resp. rate (!) 28, height 5\' 7"  (1.702 m), weight 100.2 kg, SpO2 97 %.    HEENT-  Normocephalic, no lesions, without obvious abnormality.  Normal external eye and conjunctiva.  Normal TM's bilaterally.  Normal auditory canals and external ears. Normal external nose, mucus membranes and septum.  Normal pharynx. Cardiovascular- S1, S2 normal, pulses palpable throughout   Lungs- chest clear, no wheezing, rales, normal symmetric air entry Abdomen- soft, non-tender; bowel sounds normal; no masses,  no organomegaly Extremities- no edema Lymph-no adenopathy palpable Musculoskeletal-no joint tenderness, deformity or swelling Skin-warm and dry, no hyperpigmentation, vitiligo, or suspicious lesions  Neurological Exam   Mental Status: Alert, oriented, thought content appropriate.  Speech fluent without evidence of aphasia.  Able to follow 3 step commands without difficulty. Attention span and concentration seemed appropriate  Cranial Nerves: II: Discs flat bilaterally; Visual fields grossly normal, pupils equal, round, reactive to light and accommodation III,IV, VI: ptosis not present, extra-ocular motions intact bilaterally V,VII: smile symmetric, facial light touch  sensation intact VIII: hearing normal bilaterally IX,X: gag reflex present XI: bilateral shoulder shrug XII: midline tongue extension Motor: Right :  Upper extremity   5/5 Without pronator drift      Left: Upper extremity   5/5 without pronator drift Right:   Lower extremity   5/5  Left: Lower extremity   5/5 Tone and bulk:normal tone throughout; no atrophy noted Sensory: Pinprick and light touch intact bialterally Deep Tendon Reflexes: 2+ and symmetric throughout Plantars: Right: mute                             Left: mute Cerebellar: Finger-to-nose testing intact bilaterally. Heel to shin testing normal bilaterally Gait: not tested due to safety concerns   Data Reviewed  Laboratory Studies:  Basic Metabolic Panel: Recent Labs  Lab 03/23/18 1847 03/24/18 0204 03/25/18 0108  NA 136 140 138  K 4.3 3.3* 3.5  CL 101 105 109  CO2 21* 22 19*  GLUCOSE 163* 72 100*  BUN 41* 41* 39*  CREATININE 4.52* 3.92* 2.84*  CALCIUM 8.3* 7.6* 6.9*  MG 1.9 2.0 1.9  PHOS  --   --  3.3    Liver Function Tests: Recent Labs  Lab 03/23/18 1847 03/25/18 0108  AST 22 34  ALT 13 18  ALKPHOS 73 47  BILITOT 0.6 0.7  PROT 7.5 6.0*  ALBUMIN 3.5 2.6*   Recent Labs  Lab 03/23/18 1847  LIPASE 34   Recent Labs  Lab 03/25/18 0543  AMMONIA 15    CBC: Recent Labs  Lab 03/23/18 1847 03/24/18 0204 03/25/18 0108  WBC 11.3* 11.9* 9.3  NEUTROABS  --   --  6.1  HGB 11.2* 10.8* 8.9*  HCT 32.8* 32.1* 25.3*  MCV 93.4 94.6 92.2  PLT 164 140* 113*    Cardiac Enzymes: Recent Labs  Lab 03/23/18 1847 03/23/18 2341 03/24/18 0204 03/24/18 0731 03/25/18 0539  CKTOTAL  --   --   --  877*  --   TROPONINI 0.03* <0.03 0.03*  --  0.15*    BNP: Invalid input(s): POCBNP  CBG: Recent Labs  Lab 03/24/18 2347 03/25/18 0221 03/25/18 0401 03/25/18 0422 03/25/18 0743  GLUCAP 93 29 117* 133* 168*    Microbiology: Results for orders placed or  performed during the hospital encounter of 03/23/18  MRSA PCR Screening     Status: None   Collection Time: 03/23/18 10:46 PM  Result Value Ref Range Status   MRSA by PCR NEGATIVE NEGATIVE Final    Comment:        The GeneXpert MRSA Assay (FDA approved for NASAL specimens only), is one component of a comprehensive MRSA colonization surveillance program. It is not intended to diagnose MRSA infection nor to guide or monitor treatment for MRSA infections. Performed at [facility], [address] [place], [place] [address]   CULTURE, BLOOD (ROUTINE X 2) w Reflex to ID Panel     Status: None (Preliminary result)   Collection Time: 03/23/18 11:44 PM  Result Value Ref Range Status   Specimen Description BLOOD LEFT ANTECUBITAL  Final   Special Requests   Final    BOTTLES DRAWN AEROBIC AND ANAEROBIC Blood Culture adequate volume   Culture   Final    NO GROWTH 1 DAY Performed at [facility], [address], [place], [place] [address]    Report Status PENDING  Incomplete  CULTURE, BLOOD (ROUTINE X 2) w Reflex to ID Panel     Status: None (Preliminary result)   Collection Time: 03/23/18 11:52 PM  Result Value Ref Range Status   Specimen Description BLOOD BLOOD LEFT HAND  Final   Special Requests   Final    BOTTLES DRAWN AEROBIC AND ANAEROBIC Blood Culture adequate volume  Culture   Final    NO GROWTH 1 DAY Performed at [facility], [address] [place], [place] [address]    Report Status PENDING  Incomplete    Coagulation Studies: Recent Labs    03/23/18 10-11-41 03/25/18 0108 03/25/18 0539  LABPROT 16.5* 15.3* 15.0  INR 1.34 1.22 1.19    Urinalysis:  Recent Labs  Lab 03/23/18 1714  COLORURINE YELLOW*  LABSPEC 1.013  PHURINE 5.0  GLUCOSEU >=500*  HGBUR SMALL*  BILIRUBINUR NEGATIVE  KETONESUR NEGATIVE  PROTEINUR 30*  NITRITE NEGATIVE  LEUKOCYTESUR NEGATIVE    Lipid Panel:    Component Value Date/Time   CHOL 215 (H) 11/05/2017  0752   TRIG 387 (H) 11/05/2017 0752   HDL 32 (L) 11/05/2017 0752   CHOLHDL 6.7 11/05/2017 0752   VLDL 77 (H) 11/05/2017 0752   LDLCALC 106 (H) 11/05/2017 0752    HgbA1C:  Lab Results  Component Value Date   HGBA1C 7.8 (H) 11/05/2017    Urine Drug Screen:      Component Value Date/Time   LABOPIA NONE DETECTED 03/25/2018 0543   COCAINSCRNUR NONE DETECTED 03/25/2018 0543   LABBENZ NONE DETECTED 03/25/2018 0543   AMPHETMU NONE DETECTED 03/25/2018 0543   THCU NONE DETECTED 03/25/2018 0543   LABBARB POSITIVE (A) 03/25/2018 0543    Alcohol Level: No results for input(s): ETH in the last 168 hours.  Other results: EKG: normal EKG, normal sinus rhythm, unchanged from previous tracings, atrial fibrillation, rate controlled on Amiodarone.  Imaging: Ct Head Wo Contrast  Result Date: 03/25/2018 CLINICAL DATA:  Initial evaluation for acute altered mental status. EXAM: CT HEAD WITHOUT CONTRAST TECHNIQUE: Contiguous axial images were obtained from the base of the skull through the vertex without intravenous contrast. COMPARISON:  Prior CT from 07/31/2017. FINDINGS: Brain: Generalized age-related cerebral atrophy with mild chronic small vessel ischemic disease, stable. No acute intracranial hemorrhage. No acute large vessel territory infarct. No mass lesion, midline shift or mass effect no hydrocephalus. No extra-axial fluid collection. Vascular: No hyperdense vessel. Scattered vascular calcifications noted within the carotid siphons. Skull: Scalp soft tissues within normal limits.  Calvarium intact. Sinuses/Orbits: Globes and orbital soft tissues demonstrate no acute finding. Mild scattered mucosal thickening within the ethmoidal air cells. Paranasal sinuses are otherwise clear. No mastoid effusion. Other: None. IMPRESSION: 1. No acute intracranial abnormality. 2. Mild age-related cerebral atrophy with chronic small vessel ischemic disease, stable. Electronically Signed   By: [name]  M.D.   On: 03/25/2018 03:03   US Renal  Result Date: 03/24/2018 CLINICAL DATA:  Acute renal failure. EXAM: RENAL / URINARY TRACT ULTRASOUND COMPLETE COMPARISON:  CT scan of February 27, 2018. FINDINGS: Right Kidney: Length: 11.4 cm. 11 mm nonobstructive calculus is seen in upper pole. Echogenicity within normal limits. No mass or hydronephrosis visualized. Left Kidney: Length: 11.9 cm. Echogenicity within normal limits. No mass or hydronephrosis visualized. Bladder: Appears normal for degree of bladder distention. Bilateral ureteral jets are noted. Mild ascites is noted. IMPRESSION: Nonobstructive right renal calculus. Mild ascites is noted. No other renal abnormality is noted. Electronically Signed   By: Marijo Conception, M.D.   On: 03/24/2018 13:44   Dg Chest Port 1 View  Result Date: 03/25/2018 CLINICAL DATA:  Acute respiratory distress EXAM: PORTABLE CHEST 1 VIEW COMPARISON:  Chest radiograph from one day prior. FINDINGS: Left internal jugular central venous catheter terminates at the cavoatrial junction. Stable cardiomediastinal silhouette with mild cardiomegaly. No pneumothorax. No right pleural effusion. Probable stable small  left pleural effusion. No overt pulmonary edema. Low lung volumes with stable left retrocardiac opacity. IMPRESSION: 1. Stable mild cardiomegaly without overt pulmonary edema. 2. Probable stable small left pleural effusion. 3. Low lung volumes with stable left retrocardiac opacity, favor atelectasis. Electronically Signed   By: Ilona Sorrel M.D.   On: 03/25/2018 01:39   Dg Chest Port 1 View  Result Date: 03/24/2018 CLINICAL DATA:  Central line placement EXAM: PORTABLE CHEST 1 VIEW COMPARISON:  Chest radiograph 03/24/2018 FINDINGS: Left IJ central venous catheter tip is at the cavoatrial junction. Cardiomegaly is unchanged. No focal airspace disease. No pneumothorax. IMPRESSION: Left IJ central venous catheter tip at the cavoatrial junction. No pneumothorax. Electronically Signed    By: Ulyses Jarred M.D.   On: 03/24/2018 14:56   Dg Chest Port 1 View  Result Date: 03/24/2018 CLINICAL DATA:  Recent attempted central line placement, evaluate for possible pneumothorax EXAM: PORTABLE CHEST 1 VIEW COMPARISON:  03/23/2018 FINDINGS: Cardiac shadow remains enlarged but accentuated by the portable technique. No pneumothorax is noted following filled central line placement. No bony abnormality is seen. IMPRESSION: No evidence of pneumothorax following fail attempt at central line placement. Electronically Signed   By: Inez Catalina M.D.   On: 03/24/2018 11:08   Dg Chest Port 1 View  Result Date: 03/23/2018 CLINICAL DATA:  Weakness and fatigue EXAM: PORTABLE CHEST 1 VIEW COMPARISON:  02/22/2018 FINDINGS: Stable cardiomegaly. Nonaneurysmal thoracic aorta. Mild elevation or eventration of the right hemidiaphragm. No acute pulmonary consolidation. No effusion or pneumothorax. No overt pulmonary edema. No acute nor suspicious osseous abnormalities. No free air beneath the diaphragm. IMPRESSION: Stable cardiomegaly.  No active pulmonary disease. Electronically Signed   By: Ashley Royalty M.D.   On: 03/23/2018 22:42   [place] Ekg Site Rite  Result Date: 03/24/2018 If Site Rite image not attached, placement could not be confirmed due to current cardiac rhythm.  Patient seen and examined.  Clinical course and management discussed.  Necessary edits performed.  I agree with the above.  Assessment and plan of care developed and discussed below.    Assessment: 57 y.o. male with multiple medical problems presenting as a code stroke due to an episode of altered mental status and aphasia.  Head CT reviewed and shows no acute intracranial abnormality.  Patient has returned to baseline.  Etiology of event unclear.  There is a question of medication effect.  Would consider TIA and seizure as well.  Doubt meningitis with patient's rapid return to baseline.  LP not indicated at this time.    Stroke Risk Factors -  diabetes mellitus, family history, hyperlipidemia and hypertension  Plan: 1. MRI of the brain without contrast 2. EEG 3. Telemetry monitoring 4. Frequent neuro checks  This patient was staffed with Dr. Magda Paganini, Doy Mince who personally evaluated patient, reviewed documentation and agreed with assessment and plan of care as above.  Rufina Falco, DNP, FNP-BC Board certified Nurse Practitioner Neurology Department  03/25/2018, 8:41 AM   Alexis Goodell, MD Neurology [phone]  03/25/2018  2:35 PM

## 2018-03-25 NOTE — Progress Notes (Signed)
Patient has remained alert and oriented through out shift.  Levophed and neosynephrine titrated off. Patient hemodynamically stable.  Order obtained for patient to go to MRI without RN supervision.

## 2018-03-25 NOTE — Progress Notes (Signed)
[facility], [place] 03/25/18  Subjective:   Patient had an episode last night where he was thought to have a aphasia.  Code stroke was called.  TPA was not given.  This morning he is back to his usual self. Urine output 1900 cc Serum creatinine slightly improved to 2.84 Still requiring infusion of pressors phenylephrine and norepinephrine  Objective:  Vital signs in last 24 hours:  Temp:  [98.2 F (36.8 C)-101.3 F (38.5 C)] 98.2 F (36.8 C) (09/26 0430) Pulse Rate:  [66-101] 86 (09/26 0630) Resp:  [0-34] 28 (09/26 0630) BP: (70-133)/(43-115) 110/59 (09/26 0630) SpO2:  [90 %-98 %] 97 % (09/26 0630)  Weight change:  Filed Weights   03/23/18 1839  Weight: 100.2 kg    Intake/Output:    Intake/Output Summary (Last 24 hours) at 03/25/2018 0958 Last data filed at 03/25/2018 0600 Gross per 24 hour  Intake 4565.76 ml  Output 1575 ml  Net 2990.76 ml     Physical Exam: General:  No acute distress, laying in the bed  HEENT  anicteric, moist oral mucous membranes  Neck  supple  Pulm/lungs  normal breathing effort, mild basilar crackles  CVS/Heart  regular  Abdomen:   Soft, nontender  Extremities:  No significant edema  Neurologic:  Alert, oriented, speech normal, able to answer questions             Basic Metabolic Panel:  Recent Labs  Lab 03/23/18 1847 03/24/18 0204 03/25/18 0108  NA 136 140 138  K 4.3 3.3* 3.5  CL 101 105 109  CO2 21* 22 19*  GLUCOSE 163* 72 100*  BUN 41* 41* 39*  CREATININE 4.52* 3.92* 2.84*  CALCIUM 8.3* 7.6* 6.9*  MG 1.9 2.0 1.9  PHOS  --   --  3.3     CBC: Recent Labs  Lab 03/23/18 1847 03/24/18 0204 03/25/18 0108  WBC 11.3* 11.9* 9.3  NEUTROABS  --   --  6.1  HGB 11.2* 10.8* 8.9*  HCT 32.8* 32.1* 25.3*  MCV 93.4 94.6 92.2  PLT 164 140* 113*     No results found for: HEPBSAG, HEPBSAB, HEPBIGM    Microbiology:  Recent Results (from the past 240 hour(s))  MRSA PCR Screening     Status:  None   Collection Time: 03/23/18 10:46 PM  Result Value Ref Range Status   MRSA by PCR NEGATIVE NEGATIVE Final    Comment:        The GeneXpert MRSA Assay (FDA approved for NASAL specimens only), is one component of a comprehensive MRSA colonization surveillance program. It is not intended to diagnose MRSA infection nor to guide or monitor treatment for MRSA infections. Performed at [facility], [address] [place], [place] [address]   CULTURE, BLOOD (ROUTINE X 2) w Reflex to ID Panel     Status: None (Preliminary result)   Collection Time: 03/23/18 11:44 PM  Result Value Ref Range Status   Specimen Description BLOOD LEFT ANTECUBITAL  Final   Special Requests   Final    BOTTLES DRAWN AEROBIC AND ANAEROBIC Blood Culture adequate volume   Culture   Final    NO GROWTH 1 DAY Performed at [facility], [address], [place], [place] [address]    Report Status PENDING  Incomplete  CULTURE, BLOOD (ROUTINE X 2) w Reflex to ID Panel     Status: None (Preliminary result)   Collection Time: 03/23/18 11:52 PM  Result Value Ref Range Status  Specimen Description BLOOD BLOOD LEFT HAND  Final   Special Requests   Final    BOTTLES DRAWN AEROBIC AND ANAEROBIC Blood Culture adequate volume   Culture   Final    NO GROWTH 1 DAY Performed at [facility], [address] [place], [place] [address]    Report Status PENDING  Incomplete    Coagulation Studies: Recent Labs    03/23/18 2041/09/26 03/25/18 0108 03/25/18 0539  LABPROT 16.5* 15.3* 15.0  INR 1.34 1.22 1.19    Urinalysis: Recent Labs    03/23/18 1714  COLORURINE YELLOW*  LABSPEC 1.013  PHURINE 5.0  GLUCOSEU >=500*  HGBUR SMALL*  BILIRUBINUR NEGATIVE  KETONESUR NEGATIVE  PROTEINUR 30*  NITRITE NEGATIVE  LEUKOCYTESUR NEGATIVE      Imaging: Ct Head Wo Contrast  Result Date: 03/25/2018 CLINICAL DATA:  Initial evaluation for acute altered mental status. EXAM: CT HEAD WITHOUT CONTRAST  TECHNIQUE: Contiguous axial images were obtained from the base of the skull through the vertex without intravenous contrast. COMPARISON:  Prior CT from 07/31/2017. FINDINGS: Brain: Generalized age-related cerebral atrophy with mild chronic small vessel ischemic disease, stable. No acute intracranial hemorrhage. No acute large vessel territory infarct. No mass lesion, midline shift or mass effect no hydrocephalus. No extra-axial fluid collection. Vascular: No hyperdense vessel. Scattered vascular calcifications noted within the carotid siphons. Skull: Scalp soft tissues within normal limits.  Calvarium intact. Sinuses/Orbits: Globes and orbital soft tissues demonstrate no acute finding. Mild scattered mucosal thickening within the ethmoidal air cells. Paranasal sinuses are otherwise clear. No mastoid effusion. Other: None. IMPRESSION: 1. No acute intracranial abnormality. 2. Mild age-related cerebral atrophy with chronic small vessel ischemic disease, stable. Electronically Signed   By: [name] M.D.   On: 03/25/2018 03:03   US Renal  Result Date: 03/24/2018 CLINICAL DATA:  Acute renal failure. EXAM: RENAL / URINARY TRACT ULTRASOUND COMPLETE COMPARISON:  CT scan of February 27, 2018. FINDINGS: Right Kidney: Length: 11.4 cm. 11 mm nonobstructive calculus is seen in upper pole. Echogenicity within normal limits. No mass or hydronephrosis visualized. Left Kidney: Length: 11.9 cm. Echogenicity within normal limits. No mass or hydronephrosis visualized. Bladder: Appears normal for degree of bladder distention. Bilateral ureteral jets are noted. Mild ascites is noted. IMPRESSION: Nonobstructive right renal calculus. Mild ascites is noted. No other renal abnormality is noted. Electronically Signed   By: Marijo Conception, M.D.   On: 03/24/2018 13:44   Dg Chest Port 1 View  Result Date: 03/25/2018 CLINICAL DATA:  Acute respiratory distress EXAM: PORTABLE CHEST 1 VIEW COMPARISON:  Chest radiograph from one day  prior. FINDINGS: Left internal jugular central venous catheter terminates at the cavoatrial junction. Stable cardiomediastinal silhouette with mild cardiomegaly. No pneumothorax. No right pleural effusion. Probable stable small left pleural effusion. No overt pulmonary edema. Low lung volumes with stable left retrocardiac opacity. IMPRESSION: 1. Stable mild cardiomegaly without overt pulmonary edema. 2. Probable stable small left pleural effusion. 3. Low lung volumes with stable left retrocardiac opacity, favor atelectasis. Electronically Signed   By: Ilona Sorrel M.D.   On: 03/25/2018 01:39   Dg Chest Port 1 View  Result Date: 03/24/2018 CLINICAL DATA:  Central line placement EXAM: PORTABLE CHEST 1 VIEW COMPARISON:  Chest radiograph 03/24/2018 FINDINGS: Left IJ central venous catheter tip is at the cavoatrial junction. Cardiomegaly is unchanged. No focal airspace disease. No pneumothorax. IMPRESSION: Left IJ central venous catheter tip at the cavoatrial junction. No pneumothorax. Electronically Signed   By: Cletus Gash.D.  On: 03/24/2018 14:56   Dg Chest Port 1 View  Result Date: 03/24/2018 CLINICAL DATA:  Recent attempted central line placement, evaluate for possible pneumothorax EXAM: PORTABLE CHEST 1 VIEW COMPARISON:  03/23/2018 FINDINGS: Cardiac shadow remains enlarged but accentuated by the portable technique. No pneumothorax is noted following filled central line placement. No bony abnormality is seen. IMPRESSION: No evidence of pneumothorax following fail attempt at central line placement. Electronically Signed   By: Inez Catalina M.D.   On: 03/24/2018 11:08   Dg Chest Port 1 View  Result Date: 03/23/2018 CLINICAL DATA:  Weakness and fatigue EXAM: PORTABLE CHEST 1 VIEW COMPARISON:  02/22/2018 FINDINGS: Stable cardiomegaly. Nonaneurysmal thoracic aorta. Mild elevation or eventration of the right hemidiaphragm. No acute pulmonary consolidation. No effusion or pneumothorax. No overt pulmonary  edema. No acute nor suspicious osseous abnormalities. No free air beneath the diaphragm. IMPRESSION: Stable cardiomegaly.  No active pulmonary disease. Electronically Signed   By: Ashley Royalty M.D.   On: 03/23/2018 22:42   [place] Ekg Site Rite  Result Date: 03/24/2018 If Site Rite image not attached, placement could not be confirmed due to current cardiac rhythm.    Medications:   . acyclovir Stopped (03/25/18 0459)  . amiodarone 30 mg/hr (03/25/18 0600)  . ampicillin (OMNIPEN) IV 300 mL/hr at 03/25/18 0600  . cefTAZidime (FORTAZ)  IV Stopped (03/25/18 0441)  . norepinephrine (LEVOPHED) Adult infusion 30 mcg/min (03/25/18 0600)  . norepinephrine Stopped (03/25/18 0310)  . phenylephrine (NEO-SYNEPHRINE) Adult infusion 50 mcg/min (03/25/18 0600)   . abacavir  600 mg Oral Daily  . aspirin EC  325 mg Oral Daily  . dexamethasone  10 mg Intravenous Q6H  . divalproex  500 mg Oral BID  . docusate sodium  100 mg Oral Daily  . dolutegravir  50 mg Oral Daily  . feeding supplement (NEPRO CARB STEADY)  237 mL Oral BID BM  . iloperidone  12 mg Oral BID  . insulin aspart  0-9 Units Subcutaneous Q4H  . lamiVUDine  100 mg Oral Daily  . pantoprazole  40 mg Oral BID  . rosuvastatin  20 mg Oral Daily  . venlafaxine XR  75 mg Oral Q breakfast   acetaminophen **OR** acetaminophen, diphenhydrAMINE, ondansetron **OR** ondansetron (ZOFRAN) IV, oxyCODONE, sodium chloride flush  Assessment/ Plan:  57 y.o. 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   1.  Acute renal failure, likely multifactorial Patient has history of nonsteroidal use until about a month ago.  He also has a right kidney stone 11 mm that was nonobstructive as per CT done on August 26.  He was evaluated for hypotension, dizziness and similar complaints about a month ago.  Other differential diagnosis  includes ATN due to hypotension in the setting of taking ACE inhibitors.  Patient states that he has not missed his HIV medications therefore HIV-associated nephropathy less likely Urinalysis is unremarkable.  Urine protein to creatinine ratio 0.5 g Serum creatinine is improving with IV hydration. Urine output significantly improved today  Serum creatinine has improved today to 2.84 from IV resuscitation and hemodynamic stability.  Continue management as per ICU team  Electrolytes and volume status are acceptable.  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  GFR greater than 60.  Managed with ACE inhibitors  3.  Acute pericarditis Evaluation by cardiology is ongoing.  4. Hypokalemia -Electrolyte replacement as per ICU protocols.  5. OSA - May need CPAP at night .     LOS: [place] 9/26/20199:58 AM  Luce, Amelia  Note: This note was prepared with Dragon dictation. Any transcription errors are unintentional

## 2018-03-25 NOTE — Progress Notes (Signed)
Called and discussed with Dr. Doy Mince of Neurology pt's neuro symptoms and consultation with [facility].  Per Dr. Doy Mince, continue to follow [facility] recommendations.  If they deem pt needs tPA, she will urgently come assess pt.  It they recommend against tPA, she will follow up with pt in the am as Routine Consult.  She is in agreement with empiric treatment of meninginitis.  Will defer obtaining LP to Neurology. CT Head wo contrast negative for any acute abnormality.   Darel Hong, AGACNP-BC [facility] Pager: [phone]

## 2018-03-25 NOTE — Progress Notes (Addendum)
It is noted that pt had fever of 101 prior to change in mental status.  Will cover empirically for meningitis at this time.  Will consult Neurology.    Darel Hong, AGACNP-BC [facility] Pager: [phone]

## 2018-03-25 NOTE — Procedures (Signed)
ELECTROENCEPHALOGRAM REPORT   Patient: Danny Cook.       Room #: IC09A-AA EEG No. ID: [id number] Age: 58 y.o.        Sex: male Referring Physician: Manuella Ghazi Report Date:  03/25/2018        Interpreting Physician: Alexis Goodell  History: Amadu Schlageter. is an 57 y.o. male with a episode of altered mental status  Medications:  Ziagen, ASA, Decadron, Colace, Tivicav, Fanapt, Insulin, Epivir, Levophed, Protonix, Crestor, Effexor  Conditions of Recording:  This is a 21 channel routine scalp EEG performed with bipolar and monopolar montages arranged in accordance to the international 10/20 system of electrode placement. One channel was dedicated to EKG recording.  The patient is in the awake state.  Description:  The waking background activity consists of a low voltage, symmetrical, fairly well organized, 9 Hz alpha activity, seen from the parieto-occipital and posterior temporal regions.  Low voltage fast activity, poorly organized, is seen anteriorly and is at times superimposed on more posterior regions.  A mixture of theta and alpha rhythms are seen from the central and temporal regions. The patient does not drowse or sleep. No epileptiform activity is noted.   Hyperventilation and intermittent photic stimulation were not performed.   IMPRESSION: This is a normal awake electroencephalogram. There are no focal lateralizing or epileptiform features.   Comment:  An EEG with the patient sleep deprived to elicit drowse and light sleep may be desirable to further elicit a possible seizure disorder.     Alexis Goodell, MD Neurology [phone] 03/25/2018, 1:06 PM

## 2018-03-25 NOTE — Progress Notes (Signed)
Pharmacy Antibiotic Note  Danny Cook. is a 57 y.o. male admitted on 03/23/2018 with pericarditis. Concern for sepsis for which patient was receiving vancomycin, cefepime and vancomycin. Yesterday, there was concern for possible meningitis for which antibiotic coverage was broadened. Pharmacy now consulted for vancomycin and cefepime dosing with concern for sepsis.  Plan: Patient received vancomycin 1500 mg at 0600 this morning. Will start vancomycin 1 g IV q24h to start tomorrow at 1000. Goal trough 15-20 mcg/mL. Trough ordered 9/29 at 0900. Start cefepime 2 g IV q12h to start tonight at 1800  Height: 5\' 7"  (170.2 cm) Weight: 221 lb (100.2 kg) IBW/kg (Calculated) : 66.1  Temp (24hrs), Avg:99.7 F (37.6 C), Min:98.2 F (36.8 C), Max:101.3 F (38.5 C)  Recent Labs  Lab 03/23/18 1847 03/23/18 2341 03/24/18 0204 03/25/18 0108  WBC 11.3* 9.8 11.9* 9.3  CREATININE 4.52*  --  3.92* 2.84*  LATICACIDVEN  --  1.3 1.9  --     Estimated Creatinine Clearance: 31.6 mL/min (A) (by C-G formula based on SCr of 2.84 mg/dL (H)).    Allergies  Allergen Reactions  . Trazodone And Nefazodone   . Viread [Tenofovir Disoproxil]     Damages kidneys  . Etodolac Rash    Antimicrobials this admission: Cefepime 9/25 x 1 dose Ceftazidime 9/26 >> 9/26 Ampicillin 9/26 >> 9/26 Acyclovir 9/26 >> 9/26 Vancomycin 9/25 >> Cefepime 9/26 >>   Dose adjustments this admission: N/A  Microbiology results: 9/24 BCx: NG 1 day  9/24 MRSA PCR: negative  Thank you for allowing pharmacy to be a part of this patient's care.  Tawnya Crook, PharmD Pharmacy Resident  03/25/2018 3:15 PM

## 2018-03-25 NOTE — Progress Notes (Signed)
Spoke with [facility] of pt's improvement in mentation and resolving neuro symptoms. They are in agreement with avoiding urgent CTA with contrast, and recommend obtaining ROUTINE Carotid US, MRI Head wo contrast, and MRA Head wo contrast.  Orders placed.     Darel Hong, AGACNP-BC [facility] Pager: [phone]

## 2018-03-25 NOTE — Progress Notes (Signed)
PULMONARY / CRITICAL CARE MEDICINE   Name: Danny Cook. MRN: [id number] DOB: January 22, 1961    ADMISSION DATE:  03/23/2018  REFERRING MD:  Dr. Jannifer Franklin  CHIEF COMPLAINT:  Generalized weakness and poor PO intake  DISCUSSION: 57 y.o.  Male with Hx of HIV, admitted with Pericarditis with a small to moderate pericardial effusion without evidence of tamponade on bedside Echo, and AKI on CKD.  HISTORY OF PRESENT ILLNESS:   Danny Cook is a 57 y.o. Male with a PMH as listed below who presents to [facility] ED on 03/23/18 with c/o generalized weakness and poor po intake for approximately 3 to 4 days.  He reports that he was at his PCP today who checked labs and referred him to the ED for fluids due to dehydration and AKI.  Pt denies chest pain, shortness of breath, or fever/chills.  Initial workup in the ED revealed Creatinine 4.52 (baseline Cr 1.2), serum CO2 21, Anion gap 14, and glucose 163.  Pt was noted to have hypotension and orthostatic symptoms.  EKG was concerning for ST elevation, but clinically he denied chest pain, SOB, or other symptoms suggestive of ACS. Troponin was 0.03.  Cardiology was consulted and saw pt in ED, of which a bedside Ultrasound was performed.  Bedside Ultrasound was concerning for Pericarditis with a small to moderate pericardial effusion without evidence of tamponade.  He is admitted to [facility] ICU for treatment of Pericarditis and AKI on CKD.  PCCM is consulted for further management.   Significant event: 03/24/2018 admission, central line attempt, Picc placement 9/26: Code S called- Head CT ok- More hypotensive went on phenylephrine and levo- antibiotics changed to meningitis coverage  Culture: Blood> Sputum> Urine>  Antibiotics: Vanco plus cefepime 9/2 5 Vanco +Ceftazidime+ Acyclovir+ Ampicilin started on 9/26  Subjective: Overnight became somnolant and code S called, Head Ct ordered and was negative, CTA recommended but patient was waking up and following all  commands and with Cr like this it was kept on hold  - Currently patient in sleeping but waking up and able to follow command -7400 + -- Renal sono is done no significant abnormality, Cr showing improvement -- Overnight required more pressors now on Levophed  30 mcg and phenylephrine 50 mcg and amiodarone 0.5 - Prior investigation suggested adrenal adenoma and kidney stone VITAL SIGNS: BP (!) 110/59   Pulse 86   Temp 98.2 F (36.8 C) (Oral)   Resp (!) 28   Ht 5\' 7"  (1.702 m)   Wt 100.2 kg   SpO2 97%   BMI 34.61 kg/m   INTAKE / OUTPUT: I/O last 3 completed shifts: In: 9465 [P.O.:840; I.V.:3674.7; IV Piggyback:4950.3] Out: 2025 [Urine:2025]  PHYSICAL EXAMINATION: General:  Acutely ill appearing male, laying in bed, on room air, in NAD Neuro:  Awake, A&O x4, follows commands, no focal deficits HEENT:  Atraumatic, normocephalic, neck supple, no JVD Cardiovascular: RRR, s1s2, no M/R/G, 2+ pulses throughout Lungs:  Clear diminished bilaterally, no wheezing, even, non-labored, normal effort Abdomen:  Obese, tender, BS+ x4 Musculoskeletal:  No deformities, normal bulk and tone Skin:  Warm/dry. No obvious rashes, lesions, or ulcerations  LABS:  BMET Recent Labs  Lab 03/23/18 1847 03/24/18 0204 03/25/18 0108  NA 136 140 138  K 4.3 3.3* 3.5  CL 101 105 109  CO2 21* 22 19*  BUN 41* 41* 39*  CREATININE 4.52* 3.92* 2.84*  GLUCOSE 163* 72 100*    Electrolytes Recent Labs  Lab 03/23/18 1847 03/24/18 0204 03/25/18 0108  CALCIUM 8.3* 7.6* 6.9*  MG 1.9 2.0 1.9  PHOS  --   --  3.3    CBC Recent Labs  Lab 03/23/18 1847 03/24/18 0204 03/25/18 0108  WBC 11.3* 11.9* 9.3  HGB 11.2* 10.8* 8.9*  HCT 32.8* 32.1* 25.3*  PLT 164 140* 113*    Coag's Recent Labs  Lab 03/23/18 2043 03/25/18 0108 03/25/18 0539  APTT 155*  --  39*  INR 1.34 1.22 1.19    Sepsis Markers Recent Labs  Lab 03/23/18 2341 03/24/18 0204 03/25/18 0108  LATICACIDVEN 1.3 1.9  --    PROCALCITON 0.83 0.80 1.13    ABG Recent Labs  Lab 03/25/18 0222  PHART 7.38  PCO2ART 29*  PO2ART 60*    Liver Enzymes Recent Labs  Lab 03/23/18 1847 03/25/18 0108  AST 22 34  ALT 13 18  ALKPHOS 73 47  BILITOT 0.6 0.7  ALBUMIN 3.5 2.6*    Cardiac Enzymes Recent Labs  Lab 03/23/18 2341 03/24/18 0204 03/25/18 0539  TROPONINI <0.03 0.03* 0.15*    Glucose Recent Labs  Lab 03/24/18 1710 03/24/18 1928 03/24/18 2347 03/25/18 0221 03/25/18 0401 03/25/18 0422  GLUCAP 118* 101* 93 83 117* 133*    Imaging Ct Head Wo Contrast  Result Date: 03/25/2018 CLINICAL DATA:  Initial evaluation for acute altered mental status. EXAM: CT HEAD WITHOUT CONTRAST TECHNIQUE: Contiguous axial images were obtained from the base of the skull through the vertex without intravenous contrast. COMPARISON:  Prior CT from 07/31/2017. FINDINGS: Brain: Generalized age-related cerebral atrophy with mild chronic small vessel ischemic disease, stable. No acute intracranial hemorrhage. No acute large vessel territory infarct. No mass lesion, midline shift or mass effect no hydrocephalus. No extra-axial fluid collection. Vascular: No hyperdense vessel. Scattered vascular calcifications noted within the carotid siphons. Skull: Scalp soft tissues within normal limits.  Calvarium intact. Sinuses/Orbits: Globes and orbital soft tissues demonstrate no acute finding. Mild scattered mucosal thickening within the ethmoidal air cells. Paranasal sinuses are otherwise clear. No mastoid effusion. Other: None. IMPRESSION: 1. No acute intracranial abnormality. 2. Mild age-related cerebral atrophy with chronic small vessel ischemic disease, stable. Electronically Signed   By: [name] M.D.   On: 03/25/2018 03:03   US Renal  Result Date: 03/24/2018 CLINICAL DATA:  Acute renal failure. EXAM: RENAL / URINARY TRACT ULTRASOUND COMPLETE COMPARISON:  CT scan of February 27, 2018. FINDINGS: Right Kidney: Length: 11.4  cm. 11 mm nonobstructive calculus is seen in upper pole. Echogenicity within normal limits. No mass or hydronephrosis visualized. Left Kidney: Length: 11.9 cm. Echogenicity within normal limits. No mass or hydronephrosis visualized. Bladder: Appears normal for degree of bladder distention. Bilateral ureteral jets are noted. Mild ascites is noted. IMPRESSION: Nonobstructive right renal calculus. Mild ascites is noted. No other renal abnormality is noted. Electronically Signed   By: Marijo Conception, M.D.   On: 03/24/2018 13:44   Dg Chest Port 1 View  Result Date: 03/25/2018 CLINICAL DATA:  Acute respiratory distress EXAM: PORTABLE CHEST 1 VIEW COMPARISON:  Chest radiograph from one day prior. FINDINGS: Left internal jugular central venous catheter terminates at the cavoatrial junction. Stable cardiomediastinal silhouette with mild cardiomegaly. No pneumothorax. No right pleural effusion. Probable stable small left pleural effusion. No overt pulmonary edema. Low lung volumes with stable left retrocardiac opacity. IMPRESSION: 1. Stable mild cardiomegaly without overt pulmonary edema. 2. Probable stable small left pleural effusion. 3. Low lung volumes with stable left retrocardiac opacity, favor atelectasis. Electronically Signed   By: Ilona Sorrel  M.D.   On: 03/25/2018 01:39   Dg Chest Port 1 View  Result Date: 03/24/2018 CLINICAL DATA:  Central line placement EXAM: PORTABLE CHEST 1 VIEW COMPARISON:  Chest radiograph 03/24/2018 FINDINGS: Left IJ central venous catheter tip is at the cavoatrial junction. Cardiomegaly is unchanged. No focal airspace disease. No pneumothorax. IMPRESSION: Left IJ central venous catheter tip at the cavoatrial junction. No pneumothorax. Electronically Signed   By: Ulyses Jarred M.D.   On: 03/24/2018 14:56     STUDIES:  CXR 9/24>> stable cardiomegaly, negative for acute disease process Echocardiogram 9/25>>  CULTURES: Blood x2 03/23/18>>  ANTIBIOTICS:   SIGNIFICANT  EVENTS: 03/23/2018>> Admission to [facility]   LINES/TUBES:   ASSESSMENT / PLAN:  PULMONARY A: No acute issues Hx: Sleep apnea P:   Supplemental O2 prn to maintain O2 sats >92% BiPAP prn at bedtime for sleep apnea -Discussed with the patient at length if he can bring his home sleep machine if not will use empiric BiPAP 12/8 in the hospital  CARDIOVASCULAR A:  Pericarditis Mildly elevated troponin Shock, likely hypovolemic +/- Septic vs. Obstructive (bedside ECHO not concerning for cardiac tamponade)-- Worsening BP and more pressure requirement- Will rpt Echo today and ask for cardiology follow up P:  -Cardiac monitoring -Maintain MAP>65 -Maintenance IVF:  LR at 50/hr -Taper levophed and phenylephrine as tolerated - RPT echo as ongoing hypotension -Cardiology following, appreciate input -Per Cardiology recommendations, do not recommend Heparin gtt or emergent cardiac cath at this time -Mild leak of troponin defer to cardiology most likely type 2 due to demand ischemia and Renal failure -Unable to treat Pericarditis with NSAIDS and Colchicine at this time due to AKI- Rehumatology consult appreciated, Plan to wait on work up and hold steroids for now -Auto-immune panel workup to evaluate for potential inflammatory cause of Pericarditis -Pending Blood cultures to look for infectious source of pericarditis -Echocardiogram pending - Currently on amiodarone for A. fib, monitor LFTs   RENAL A:   AKI on CKD (baseline Cr. 1.2) Mild Anion Gap Metabolic Acidosis P:   -Monitor I&O's / urinary output-Foley catheter -Follow BMP -Ensure adequate renal perfusion -Avoid nephrotoxic agents as able -Replace electrolytes as indicated -Gentle IVF- Start LR at 29, Good out put started with improvement in CR noted -Trend Lactic acid -Obtain Renal Ultrasound -Nephrology consulted,   NEUROLOGIC A:   AMS overnight- Extensive work up done, Head CT ok, neurology inhouse consult is pending, I think  patient received too much clonapine along with sepsis causing AMS Hx: Schizophrenia, Depression, Anxiety P:   -Provide supportive care -DC clonapine - Ammonia is ok - ABG is ok -Follow Neurology consult, MRI head, Carotid doppler -I personally do not think meningitis is causing all this but will defer to neurology and ID to decide on antibiotics and LP -Lights on during the day -Promote normal wake/sleep cycle -Avoid sedating meds as able -Continue home Effexor, Iloperidone, Depakote,   GASTROINTESTINAL A:   Constipation P:   -Monitor LFTs -Continue PO Protonix BID -Add Colace  HEMATOLOGIC A:   Anemia without signs of bleeding Drop in PLT P:  -Monitor for s/sx of bleeding -Trend CBC -Drop in PLT noted- Hold heparin and send HITT AB -Transfuse for Hgb<7  INFECTIOUS A:   Leukocytosis -CXR currently negative for consolidation at this time Hx: HIV last CD4 of 612 on August P:   -Monitor fever curve -Trend WBC -Follow ID recommendation on adjustment of antibiotics -Trend Procalcitonin, started on empiric treatment of meningitis -Continue home Triumeq  ENDOCRINE A:   Hyperglycemia Hx: DM   P:   -CBG's -SSI -Follow ICU hypo/hyperglycemia protocol  FAMILY  - Updates: Updated pt at bedside 03/23/18.  Pt remains Full Code. - Plan of care discussed with the nursing  Skin/Wound: Chronic changes  Electrolytes: Replace electrolytes per ICU electrolyte replacement protocol.   IVF: Normal saline at 100, potassium replaced  Nutrition: Start diet if no procedures done  Prophylaxis: DVT Prophylaxis with heparin,. GI Prophylaxis.   Restraints: None  PT/OT eval and treat. OOB when appropriate.   Lines/Tubes: Insert Foley plan PICC central line.  ADVANCE DIRECTIVE: Full code  FAMILY DISCUSSION: Discussed with the patient  Quality Care: PPI, DVT prophylaxis, HOB elevated, Infection control all reviewed and addressed.  Events and notes from last 24 hours  reviewed. Care plan discussed on multidisciplinary rounds  CC TIME: 35 minutes   Old records reviewed discussed results and management plan with patient  Images personally reviewed and results and labs reviewed and discussed with patient.  All medication reviewed and adjusted  Further management depending on test results and work up as outlined above.    Lahoma Rocker, M.D

## 2018-03-25 NOTE — Progress Notes (Signed)
Pt remains lethargic, but arouses easily to voice and is following commands.  Equal strength bilaterally.  Pupils PERRL sluggish bilaterally.  Aphasia has resolved.  Dr. Manuella Ghazi at bedside to assess pt.  Discussed with Dr. Manuella Ghazi, given that pt's symptoms are resolving and that pt has AKI, Dr. Manuella Ghazi wishes to cancel CTA, and would like to obtain MRI Head instead.  Pt is currently hemodynamically unstable, requiring Levophed and Neo-synephrine to maintain MAP 65.  Will obtain MRI later this morning given hemodynamically instability at present time.  Continue to monitor.

## 2018-03-26 ENCOUNTER — Inpatient Hospital Stay: Payer: Medicare Other

## 2018-03-26 LAB — GLUCOSE, CAPILLARY
Glucose-Capillary: 193 mg/dL — ABNORMAL HIGH (ref 70–99)
Glucose-Capillary: 166 mg/dL — ABNORMAL HIGH (ref 70–99)
Glucose-Capillary: 219 mg/dL — ABNORMAL HIGH (ref 70–99)
Glucose-Capillary: 216 mg/dL — ABNORMAL HIGH (ref 70–99)
Glucose-Capillary: 187 mg/dL — ABNORMAL HIGH (ref 70–99)
Glucose-Capillary: 328 mg/dL — ABNORMAL HIGH (ref 70–99)

## 2018-03-26 LAB — BLOOD GAS, ARTERIAL
Acid-base deficit: 7 mmol/L — ABNORMAL HIGH (ref 0.0–2.0)
Bicarbonate: 16.7 mmol/L — ABNORMAL LOW (ref 20.0–28.0)
FIO2: 0.21
O2 Saturation: 93.5 %
PH ART: 7.4 (ref 7.350–7.450)
Patient temperature: 37
pCO2 arterial: 27 mmHg — ABNORMAL LOW (ref 32.0–48.0)
pO2, Arterial: 69 mmHg — ABNORMAL LOW (ref 83.0–108.0)

## 2018-03-26 LAB — C3 COMPLEMENT: C3 Complement: 151 mg/dL (ref 82–167)

## 2018-03-26 LAB — CBC
HCT: 28.5 % — ABNORMAL LOW (ref 40.0–52.0)
Hemoglobin: 9.9 g/dL — ABNORMAL LOW (ref 13.0–18.0)
MCH: 32.1 pg (ref 26.0–34.0)
MCHC: 34.7 g/dL (ref 32.0–36.0)
MCV: 92.6 fL (ref 80.0–100.0)
Platelets: 135 10*3/uL — ABNORMAL LOW (ref 150–440)
RBC: 3.08 MIL/uL — ABNORMAL LOW (ref 4.40–5.90)
RDW: 14.7 % — ABNORMAL HIGH (ref 11.5–14.5)
WBC: 9.2 10*3/uL (ref 3.8–10.6)

## 2018-03-26 LAB — COMPREHENSIVE METABOLIC PANEL
ALBUMIN: 2.9 g/dL — AB (ref 3.5–5.0)
ALT: 31 U/L (ref 0–44)
ANION GAP: 13 (ref 5–15)
AST: 41 U/L (ref 15–41)
Alkaline Phosphatase: 66 U/L (ref 38–126)
BUN: 40 mg/dL — AB (ref 6–20)
CHLORIDE: 111 mmol/L (ref 98–111)
CO2: 18 mmol/L — ABNORMAL LOW (ref 22–32)
Calcium: 7.9 mg/dL — ABNORMAL LOW (ref 8.9–10.3)
Creatinine, Ser: 1.83 mg/dL — ABNORMAL HIGH (ref 0.61–1.24)
GFR calc Af Amer: 45 mL/min — ABNORMAL LOW (ref >60)
GFR calc non Af Amer: 39 mL/min — ABNORMAL LOW (ref >60)
GLUCOSE: 208 mg/dL — AB (ref 70–99)
Potassium: 4.6 mmol/L (ref 3.5–5.1)
SODIUM: 142 mmol/L (ref 135–145)
Total Bilirubin: 0.5 mg/dL (ref 0.3–1.2)
Total Protein: 6.7 g/dL (ref 6.5–8.1)

## 2018-03-26 LAB — AMMONIA: AMMONIA: 12 umol/L (ref 9–35)

## 2018-03-26 LAB — IMMUNOFIXATION ELECTROPHORESIS
IGA: 206 mg/dL (ref 90–386)
IGG (IMMUNOGLOBIN G), SERUM: 711 mg/dL (ref 700–1600)
IgM (Immunoglobulin M), Srm: 29 mg/dL (ref 20–172)
Total Protein ELP: 5.4 g/dL — ABNORMAL LOW (ref 6.0–8.5)

## 2018-03-26 LAB — MAGNESIUM: Magnesium: 2.6 mg/dL — ABNORMAL HIGH (ref 1.7–2.4)

## 2018-03-26 LAB — C4 COMPLEMENT: COMPLEMENT C4, BODY FLUID: 21 mg/dL (ref 14–44)

## 2018-03-26 LAB — PHOSPHORUS: PHOSPHORUS: 2.9 mg/dL (ref 2.5–4.6)

## 2018-03-26 LAB — HEPARIN INDUCED PLATELET AB (HIT ANTIBODY): Heparin Induced Plt Ab: 0.258 OD (ref 0.000–0.400)

## 2018-03-26 LAB — MPO/PR-3 (ANCA) ANTIBODIES
ANCA Proteinase 3: <3.5 U/mL (ref 0.0–3.5)
Myeloperoxidase Abs: <9 U/mL (ref 0.0–9.0)

## 2018-03-26 LAB — SEDIMENTATION RATE: Sed Rate: 126 mm/hr — ABNORMAL HIGH (ref 0–20)

## 2018-03-26 LAB — CK: Total CK: 373 U/L (ref 49–397)

## 2018-03-26 LAB — CRYPTOCOCCUS ANTIGEN, SERUM: CRYPTOCOCCUS ANTIGEN, SERUM: NEGATIVE

## 2018-03-26 MED ORDER — ENALAPRIL MALEATE 2.5 MG PO TABS
2.5000 mg | ORAL_TABLET | Freq: Every day | ORAL | Status: DC
Start: 2018-03-26 — End: 2018-03-27
  Administered 2018-03-26 – 2018-03-27 (×2): 2.5 mg via ORAL
  Filled 2018-03-26 (×2): qty 1

## 2018-03-26 MED ORDER — CLONAZEPAM 0.5 MG PO TABS: 0.5000 mg | ORAL_TABLET | Freq: Three times a day (TID) | ORAL | Status: DC

## 2018-03-26 MED ORDER — SODIUM CHLORIDE 0.9 % IV SOLN
INTRAVENOUS | Status: DC | PRN
  Administered 2018-03-26: 250 mL via INTRAVENOUS

## 2018-03-26 MED ORDER — CLONAZEPAM 0.5 MG PO TABS
1.0000 mg | ORAL_TABLET | Freq: Three times a day (TID) | ORAL | Status: DC
  Administered 2018-03-26 – 2018-03-27 (×3): 1 mg via ORAL
  Filled 2018-03-26: qty 2
  Filled 2018-03-26: qty 1
  Filled 2018-03-26: qty 2

## 2018-03-26 MED ORDER — LAMIVUDINE 10 MG/ML PO SOLN
150.0000 mg | Freq: Every day | ORAL | Status: DC
  Administered 2018-03-27: 150 mg via ORAL
  Filled 2018-03-26: qty 15

## 2018-03-26 NOTE — Progress Notes (Signed)
[facility] NAME: Danny Cook    MR#:  [id number]  DATE OF BIRTH:  [birth year]  SUBJECTIVE:   Patient's mental status is much improved and back to baseline.  He is asking for anxiety medications.  Neurologic work-up was negative for stroke including CT head, MRI of the brain.  REVIEW OF SYSTEMS:    Review of Systems  Constitutional: Negative for chills and fever.  HENT: Negative for congestion and tinnitus.   Eyes: Negative for blurred vision and double vision.  Respiratory: Negative for cough, shortness of breath and wheezing.   Cardiovascular: Negative for chest pain, orthopnea and PND.  Gastrointestinal: Negative for abdominal pain, diarrhea, nausea and vomiting.  Genitourinary: Negative for dysuria and hematuria.  Neurological: Negative for dizziness, sensory change, focal weakness and headaches.  All other systems reviewed and are negative.   Nutrition: Heart Healthy/Carb modified Tolerating Diet: Yes Tolerating PT: Await Eval.    DRUG ALLERGIES:   Allergies  Allergen Reactions  . Trazodone And Nefazodone   . Viread [Tenofovir Disoproxil]     Damages kidneys  . Etodolac Rash    VITALS:  Blood pressure 132/83, pulse 64, temperature 97.6 F (36.4 C), temperature source Oral, resp. rate (!) 24, height 5\' 7"  (1.702 m), weight 100.2 kg, SpO2 93 %.  PHYSICAL EXAMINATION:   Physical Exam  GENERAL:  57 y.o.-year-old obese patient lying in bed in no acute distress.  EYES: Pupils equal, round, reactive to light and accommodation. No scleral icterus. Extraocular muscles intact.  HEENT: Head atraumatic, normocephalic. Oropharynx and nasopharynx clear.  NECK:  Supple, no jugular venous distention. No thyroid enlargement, no tenderness.  LUNGS: Normal breath sounds bilaterally, no wheezing, rales, rhonchi. No use of accessory muscles of respiration.  CARDIOVASCULAR: S1, S2 normal. No murmurs, rubs, or gallops.  ABDOMEN:  Soft, nontender, nondistended. Bowel sounds present. No organomegaly or mass.  EXTREMITIES: No cyanosis, clubbing, +1 edema b/l NEUROLOGIC: Cranial nerves II through XII are intact. No focal Motor or sensory deficits b/l. Globally weak   PSYCHIATRIC: The patient is alert and oriented x 3.  SKIN: No obvious rash, lesion, or ulcer.    LABORATORY PANEL:   CBC Recent Labs  Lab 03/26/18 0638  WBC 9.2  HGB 9.9*  HCT 28.5*  PLT 135*   ------------------------------------------------------------------------------------------------------------------  Chemistries  Recent Labs  Lab 03/26/18 0347  NA 142  K 4.6  CL 111  CO2 18*  GLUCOSE 208*  BUN 40*  CREATININE 1.83*  CALCIUM 7.9*  MG 2.6*  AST 41  ALT 31  ALKPHOS 66  BILITOT 0.5   ------------------------------------------------------------------------------------------------------------------  Cardiac Enzymes Recent Labs  Lab 03/25/18 1344  TROPONINI 0.09*   ------------------------------------------------------------------------------------------------------------------  RADIOLOGY:  Ct Head Wo Contrast  Result Date: 03/25/2018 CLINICAL DATA:  Initial evaluation for acute altered mental status. EXAM: CT HEAD WITHOUT CONTRAST TECHNIQUE: Contiguous axial images were obtained from the base of the skull through the vertex without intravenous contrast. COMPARISON:  Prior CT from 07/31/2017. FINDINGS: Brain: Generalized age-related cerebral atrophy with mild chronic small vessel ischemic disease, stable. No acute intracranial hemorrhage. No acute large vessel territory infarct. No mass lesion, midline shift or mass effect no hydrocephalus. No extra-axial fluid collection. Vascular: No hyperdense vessel. Scattered vascular calcifications noted within the carotid siphons. Skull: Scalp soft tissues within normal limits.  Calvarium intact. Sinuses/Orbits: Globes and orbital soft tissues demonstrate no acute finding. Mild scattered  mucosal thickening within the ethmoidal air cells. Paranasal sinuses are  otherwise clear. No mastoid effusion. Other: None. IMPRESSION: 1. No acute intracranial abnormality. 2. Mild age-related cerebral atrophy with chronic small vessel ischemic disease, stable. Electronically Signed   By: [name] M.D.   On: 03/25/2018 03:03   Mr Jodene Nam Head Wo Contrast  Result Date: 03/26/2018 CLINICAL DATA:  Initial evaluation for acute altered mental status, unclear cause. EXAM: MRI HEAD WITHOUT CONTRAST MRA HEAD WITHOUT CONTRAST TECHNIQUE: Multiplanar, multiecho pulse sequences of the brain and surrounding structures were obtained without intravenous contrast. Angiographic images of the head were obtained using MRA technique without contrast. COMPARISON:  Prior CT from 03/25/2018 as well as previous MRI from 06/09/2016. FINDINGS: MRI HEAD FINDINGS Brain: Mild generalized age-related cerebral atrophy. Patchy and confluent T2/FLAIR hyperintensities seen involving the periventricular, deep, and subcortical white matter both cerebral hemispheres, nonspecific, but most like related chronic small vessel ischemic change. These are mildly progressed relative to 2017. No abnormal foci of restricted diffusion to suggest acute or subacute ischemia. No encephalomalacia to suggest remote cortical infarction. No foci of susceptibility artifact to suggest acute or chronic intracranial hemorrhage. 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 No other mass lesion. No midline shift or mass effect. No general swelling or edema to suggest encephalitis. Ventricles normal size without hydrocephalus. No extra-axial fluid collection. Pituitary gland normal. Vascular: Major intracranial vascular flow voids are maintained. Skull and upper cervical spine: Craniocervical junction within normal limits. Upper cervical spine normal. Bone marrow signal intensity within normal limits. No scalp soft tissue abnormality.  Sinuses/Orbits: Globes and orbital soft tissues within normal limits. Mild mucosal thickening within the ethmoidal air cells and maxillary sinuses. Paranasal sinuses are otherwise clear. No mastoid effusion. Inner ear structures grossly normal. Other: None. MRA HEAD FINDINGS ANTERIOR CIRCULATION: Distal cervical segments of the internal carotid arteries are patent with antegrade flow. Petrous, cavernous, and supraclinoid segments widely patent without flow-limiting stenosis. Origin of the ophthalmic arteries patent bilaterally. ICA termini widely patent. A1 segments widely patent bilaterally. Azygos ACA proximally, which subsequently divides into bilateral A2 segments, both of which are widely patent. M1 segments widely patent bilaterally. Normal MCA bifurcations. No proximal M2 occlusion. Distal MCA branches well perfused and symmetric. POSTERIOR CIRCULATION: Left vertebral artery dominant and widely patent to the vertebrobasilar junction. 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 Left PICA patent. Right PICA not visualized. Dominant right AICA. Basilar widely patent to its distal aspect. Superior cerebral arteries patent bilaterally. Right PCA supplied via the basilar. Fetal type origin of the left PCA. PCAs widely patent to their distal aspects without stenosis. No intracranial aneurysm. IMPRESSION: MRI HEAD IMPRESSION: 1. No acute intracranial infarct or other abnormality identified. 2. Mild cerebral atrophy with chronic nonspecific cerebral white matter signal changes, mildly progressed relative to 2017. Findings most commonly related to sequelae of chronic small vessel ischemic disease. 3. 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 MRA HEAD IMPRESSION: 1. Negative intracranial MRA with no large vessel occlusion or hemodynamically significant stenosis identified. 2. 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 Electronically Signed   By: [name] M.D.   On: 03/26/2018 02:44   Mr Brain Wo Contrast  Result Date: 03/26/2018 CLINICAL DATA:  Initial evaluation for acute altered mental status, unclear cause. EXAM: MRI HEAD WITHOUT CONTRAST MRA HEAD WITHOUT CONTRAST TECHNIQUE: Multiplanar, multiecho pulse sequences of the brain and surrounding structures were obtained without intravenous contrast. Angiographic  images of the head were obtained using MRA technique without contrast. COMPARISON:  Prior CT from 03/25/2018 as well as previous MRI from 06/09/2016. FINDINGS: MRI HEAD FINDINGS Brain: Mild generalized age-related cerebral atrophy. Patchy and confluent T2/FLAIR hyperintensities seen involving the periventricular, deep, and subcortical white matter both cerebral hemispheres, nonspecific, but most like related chronic small vessel ischemic change. These are mildly progressed relative to 2017. No abnormal foci of restricted diffusion to suggest acute or subacute ischemia. No encephalomalacia to suggest remote cortical infarction. No foci of susceptibility artifact to suggest acute or chronic intracranial hemorrhage. 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 No other mass lesion. No midline shift or mass effect. No general swelling or edema to suggest encephalitis. Ventricles normal size without hydrocephalus. No extra-axial fluid collection. Pituitary gland normal. Vascular: Major intracranial vascular flow voids are maintained. Skull and upper cervical spine: Craniocervical junction within normal limits. Upper cervical spine normal. Bone marrow signal intensity within normal limits. No scalp soft tissue abnormality. Sinuses/Orbits: Globes and orbital soft tissues within normal limits. Mild mucosal thickening within the ethmoidal air cells and maxillary sinuses. Paranasal sinuses are otherwise clear. No mastoid effusion. Inner ear  structures grossly normal. Other: None. MRA HEAD FINDINGS ANTERIOR CIRCULATION: Distal cervical segments of the internal carotid arteries are patent with antegrade flow. Petrous, cavernous, and supraclinoid segments widely patent without flow-limiting stenosis. Origin of the ophthalmic arteries patent bilaterally. ICA termini widely patent. A1 segments widely patent bilaterally. Azygos ACA proximally, which subsequently divides into bilateral A2 segments, both of which are widely patent. M1 segments widely patent bilaterally. Normal MCA bifurcations. No proximal M2 occlusion. Distal MCA branches well perfused and symmetric. POSTERIOR CIRCULATION: Left vertebral artery dominant and widely patent to the vertebrobasilar junction. 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 Left PICA patent. Right PICA not visualized. Dominant right AICA. Basilar widely patent to its distal aspect. Superior cerebral arteries patent bilaterally. Right PCA supplied via the basilar. Fetal type origin of the left PCA. PCAs widely patent to their distal aspects without stenosis. No intracranial aneurysm. IMPRESSION: MRI HEAD IMPRESSION: 1. No acute intracranial infarct or other abnormality identified. 2. Mild cerebral atrophy with chronic nonspecific cerebral white matter signal changes, mildly progressed relative to 2017. Findings most commonly related to sequelae of chronic small vessel ischemic disease. 3. 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 MRA HEAD IMPRESSION: 1. Negative intracranial MRA with no large vessel occlusion or hemodynamically significant stenosis identified. 2. 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 Electronically Signed   By: [name] M.D.   On: 03/26/2018 02:44   US Carotid Bilateral  Result Date: 03/26/2018 CLINICAL DATA:  57 year old male with  altered mental status EXAM: BILATERAL CAROTID DUPLEX ULTRASOUND TECHNIQUE: Pearline Cables scale imaging, color Doppler and duplex ultrasound were performed of bilateral carotid and vertebral arteries in the neck. COMPARISON:  Brain MRI 03/26/2018; prior carotid duplex ultrasound 12/14/2007 FINDINGS: Criteria: Quantification of carotid stenosis is based on velocity parameters that correlate the residual internal carotid diameter with NASCET-based stenosis levels, using the diameter of the distal internal carotid lumen as the denominator for stenosis measurement. The following velocity measurements were obtained: RIGHT ICA: 78/28 cm/sec CCA: 44/01 cm/sec SYSTOLIC ICA/CCA RATIO:  1.0 ECA:  70 cm/sec LEFT ICA: 89/32 cm/sec CCA: 02/72 cm/sec SYSTOLIC ICA/CCA RATIO:  1.3 ECA:  120 cm/sec RIGHT CAROTID ARTERY: No significant atherosclerotic plaque or  evidence of stenosis. RIGHT VERTEBRAL ARTERY:  Patent with normal antegrade flow. LEFT CAROTID ARTERY: Trace heterogeneous atherosclerotic plaque at the origin of the internal carotid artery. By peak systolic velocity criteria, the estimated stenosis remains less than 50%. LEFT VERTEBRAL ARTERY:  Patent with normal antegrade flow. IMPRESSION: 1. 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 2. No evidence of atherosclerotic plaque or stenosis in the right internal carotid artery. 3. Vertebral arteries are patent with normal antegrade flow. Signed, Criselda Peaches, MD, [facility] Electronically Signed   By: Jacqulynn Cadet M.D.   On: 03/26/2018 11:13   Dg Chest Port 1 View  Result Date: 03/26/2018 CLINICAL DATA:  Shortness of breath EXAM: PORTABLE CHEST 1 VIEW COMPARISON:  03/25/2018 FINDINGS: Cardiac shadow is enlarged but stable from the previous exam. Left jugular central line is noted in satisfactory position. No pneumothorax is  noted. Left retrocardiac atelectasis is noted slightly increased when compared with the prior exam. No other focal abnormality is noted. IMPRESSION: Increasing left basilar atelectasis. Electronically Signed   By: Inez Catalina M.D.   On: 03/26/2018 06:48   Dg Chest Port 1 View  Result Date: 03/25/2018 CLINICAL DATA:  Acute respiratory distress EXAM: PORTABLE CHEST 1 VIEW COMPARISON:  Chest radiograph from one day prior. FINDINGS: Left internal jugular central venous catheter terminates at the cavoatrial junction. Stable cardiomediastinal silhouette with mild cardiomegaly. No pneumothorax. No right pleural effusion. Probable stable small left pleural effusion. No overt pulmonary edema. Low lung volumes with stable left retrocardiac opacity. IMPRESSION: 1. Stable mild cardiomegaly without overt pulmonary edema. 2. Probable stable small left pleural effusion. 3. Low lung volumes with stable left retrocardiac opacity, favor atelectasis. Electronically Signed   By: Ilona Sorrel M.D.   On: 03/25/2018 01:39     ASSESSMENT AND PLAN:   57 year old male with past medical history of schizophrenia, history of HIV, obstructive sleep apnea, CHF, CKD stage III, hypertension who presented to the hospital due to dizziness and noted to be severely hypotensive and in acute on chronic renal failure.  1.  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  Baseline creatinine is around 1.13 as of August 2019. -He presents now with acute kidney injury suspected to be multifactorial in relation to increasing nonsteroidal use over the past month, use of Metformin, Jardiance.   - Continue IV fluid hydration, Bun/Cr improving. No acute indication for dialysis.  -Serologic work-up for underlying renal failure has been initiated per nephrology.  Renal ultrasound showing no evidence of obstruction.  2.  Sepsis-source unclear.  Patient presented to the hospital with severe hypotension,  tachycardia. - Chest x-ray is negative for acute pathology, urinalysis negative.  Empirically patient is on IV vancomycin, cefepime.   -Patient's blood cultures have remained negative.  As per ID can DC IV antibiotics tomorrow if cultures remain negative.  3.  Acute pericarditis- initially when patient presented to the hospital he was noted to have ST elevations consistent with a ST elevation MI but patient had no chest pain and his cardiac markers are not elevated. - As per cardiology this is likely related to volume overload/uremia unlikely viral pericarditis.   -  Patient's echocardiogram  showed a moderate pericardial effusion but no tamponade physiology. - Seen by cardiology and continue supportive care to treat underlying renal failure.   - repeat Echo outpatient in the next few months.  4.  Altered mental status/confusion-patient had an episode of altered mental status and unresponsiveness last night.  Stroke work-up was initiated.  -CT head, MRI MRA of the brain was negative for acute pathology.  Patient's carotid duplex shows no hemodynamically significant carotid artery stenosis. - There was also concern for encephalitis but this has been ruled out and will DC acyclovir.  Mental status back to baseline.  EEG is also negative for seizures.  5.  Hypotension-in the setting of sepsis/volume loss. -Continue IV fluids, Neo-Synephrine, Levophed.  6.  History of paranoid schizophrenia/bipolar disorder- continue Depakote.  7.  Depression- continue Effexor.  8.  A. fib with RVR- cont. Amiodarone.   - Defer anticoagulation for now.  Continue aspirin.  9.  History of HIV-cont. HAART.  -Last cd4 612 and Vl 20. Appreciate ID Input.    All the records are reviewed and case discussed with Care Management/Social Worker. Management plans discussed with the patient, family and they are in agreement.  CODE STATUS: Full code  DVT Prophylaxis: Hep SQ  TOTAL TIME TAKING CARE OF THIS PATIENT:  30 minutes.   POSSIBLE D/C IN 1-2 DAYS, DEPENDING ON CLINICAL CONDITION.   Henreitta Leber M.D on 03/26/2018 at 3:41 PM  Between 7am to 6pm - Pager - [phone]  After 6pm go to [email or web address] - [employer] [facility]  Office  [phone]  CC: Primary care physician; Katheren Shams

## 2018-03-26 NOTE — Progress Notes (Signed)
Patient has transfer order to [facility] floor.  Discussed with Dr. Verdell Carmine.  Will sign off.  Please call PCCM service as needed.  Merton Border, MD PCCM service Mobile [phone] Pager [phone] 03/26/2018 2:51 PM

## 2018-03-26 NOTE — Progress Notes (Signed)
[facility], [place] 03/26/18  Subjective:   Doing well this morning. UOP 5300 cc S Creatinine is better Being transferred out of ICU   Objective:  Vital signs in last 24 hours:  Temp:  [97.6 F (36.4 C)-98.5 F (36.9 C)] 97.6 F (36.4 C) (09/27 0745) Pulse Rate:  [48-94] 64 (09/27 0745) Resp:  [14-30] 24 (09/27 0745) BP: (102-140)/(66-84) 132/83 (09/27 0745) SpO2:  [91 %-95 %] 93 % (09/27 0745)  Weight change:  Filed Weights   03/23/18 1839  Weight: 100.2 kg    Intake/Output:    Intake/Output Summary (Last 24 hours) at 03/26/2018 1036 Last data filed at 03/26/2018 0939 Gross per 24 hour  Intake 1005.78 ml  Output 5825 ml  Net -4819.22 ml     Physical Exam: General:  No acute distress, laying in the bed  HEENT  anicteric, moist oral mucous membranes  Neck  supple  Pulm/lungs  normal breathing effort, clear to ausculation  CVS/Heart  regular  Abdomen:   Soft, nontender  Extremities:  No significant edema  Neurologic:  Alert, oriented, speech normal, able to answer questions             Basic Metabolic Panel:  Recent Labs  Lab 03/23/18 1847 03/24/18 0204 03/25/18 0108 03/26/18 0347  NA 136 140 138 142  K 4.3 3.3* 3.5 4.6  CL 101 105 109 111  CO2 21* 22 19* 18*  GLUCOSE 163* 72 100* 208*  BUN 41* 41* 39* 40*  CREATININE 4.52* 3.92* 2.84* 1.83*  CALCIUM 8.3* 7.6* 6.9* 7.9*  MG 1.9 2.0 1.9 2.6*  PHOS  --   --  3.3 2.9     CBC: Recent Labs  Lab 03/23/18 1847 03/23/18 2341 03/24/18 0204 03/25/18 0108 03/26/18 0638  WBC 11.3* 9.8 11.9* 9.3 9.2  NEUTROABS  --  5.5  --  6.1  --   HGB 11.2* 9.5* 10.8* 8.9* 9.9*  HCT 32.8* 29.5* 32.1* 25.3* 28.5*  MCV 93.4 95 94.6 92.2 92.6  PLT 164 156 140* 113* 135*     No results found for: HEPBSAG, HEPBSAB, HEPBIGM    Microbiology:  Recent Results (from the past 240 hour(s))  MRSA PCR Screening     Status: None   Collection Time: 03/23/18 10:46 PM  Result Value Ref  Range Status   MRSA by PCR NEGATIVE NEGATIVE Final    Comment:        The GeneXpert MRSA Assay (FDA approved for NASAL specimens only), is one component of a comprehensive MRSA colonization surveillance program. It is not intended to diagnose MRSA infection nor to guide or monitor treatment for MRSA infections. Performed at [facility], [address] [place], [place] [address]   CULTURE, BLOOD (ROUTINE X 2) w Reflex to ID Panel     Status: None (Preliminary result)   Collection Time: 03/23/18 11:44 PM  Result Value Ref Range Status   Specimen Description BLOOD LEFT ANTECUBITAL  Final   Special Requests   Final    BOTTLES DRAWN AEROBIC AND ANAEROBIC Blood Culture adequate volume   Culture   Final    NO GROWTH 2 DAYS Performed at [facility], [address] [place], [place] [address]    Report Status PENDING  Incomplete  CULTURE, BLOOD (ROUTINE X 2) w Reflex to ID Panel     Status: None (Preliminary result)   Collection Time: 03/23/18 11:52 PM  Result Value Ref Range Status   Specimen Description BLOOD BLOOD  LEFT HAND  Final   Special Requests   Final    BOTTLES DRAWN AEROBIC AND ANAEROBIC Blood Culture adequate volume   Culture   Final    NO GROWTH 2 DAYS Performed at [facility], [address] [place], [place] [address]    Report Status PENDING  Incomplete    Coagulation Studies: Recent Labs    03/23/18 October 09, 2041 03/25/18 0108 03/25/18 0539  LABPROT 16.5* 15.3* 15.0  INR 1.34 1.22 1.19    Urinalysis: Recent Labs    03/23/18 1714  COLORURINE YELLOW*  LABSPEC 1.013  PHURINE 5.0  GLUCOSEU >=500*  HGBUR SMALL*  BILIRUBINUR NEGATIVE  KETONESUR NEGATIVE  PROTEINUR 30*  NITRITE NEGATIVE  LEUKOCYTESUR NEGATIVE      Imaging: Ct Head Wo Contrast  Result Date: 03/25/2018 CLINICAL DATA:  Initial evaluation for acute altered mental status. EXAM: CT HEAD WITHOUT CONTRAST TECHNIQUE: Contiguous axial images were obtained from the  base of the skull through the vertex without intravenous contrast. COMPARISON:  Prior CT from 07/31/2017. FINDINGS: Brain: Generalized age-related cerebral atrophy with mild chronic small vessel ischemic disease, stable. No acute intracranial hemorrhage. No acute large vessel territory infarct. No mass lesion, midline shift or mass effect no hydrocephalus. No extra-axial fluid collection. Vascular: No hyperdense vessel. Scattered vascular calcifications noted within the carotid siphons. Skull: Scalp soft tissues within normal limits.  Calvarium intact. Sinuses/Orbits: Globes and orbital soft tissues demonstrate no acute finding. Mild scattered mucosal thickening within the ethmoidal air cells. Paranasal sinuses are otherwise clear. No mastoid effusion. Other: None. IMPRESSION: 1. No acute intracranial abnormality. 2. Mild age-related cerebral atrophy with chronic small vessel ischemic disease, stable. Electronically Signed   By: [name] M.D.   On: 03/25/2018 03:03   Mr Jodene Nam Head Wo Contrast  Result Date: 03/26/2018 CLINICAL DATA:  Initial evaluation for acute altered mental status, unclear cause. EXAM: MRI HEAD WITHOUT CONTRAST MRA HEAD WITHOUT CONTRAST TECHNIQUE: Multiplanar, multiecho pulse sequences of the brain and surrounding structures were obtained without intravenous contrast. Angiographic images of the head were obtained using MRA technique without contrast. COMPARISON:  Prior CT from 03/25/2018 as well as previous MRI from 06/09/2016. FINDINGS: MRI HEAD FINDINGS Brain: Mild generalized age-related cerebral atrophy. Patchy and confluent T2/FLAIR hyperintensities seen involving the periventricular, deep, and subcortical white matter both cerebral hemispheres, nonspecific, but most like related chronic small vessel ischemic change. These are mildly progressed relative to 10-10-15. No abnormal foci of restricted diffusion to suggest acute or subacute ischemia. No encephalomalacia to suggest remote  cortical infarction. No foci of susceptibility artifact to suggest acute or chronic intracranial hemorrhage. 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 No other mass lesion. No midline shift or mass effect. No general swelling or edema to suggest encephalitis. Ventricles normal size without hydrocephalus. No extra-axial fluid collection. Pituitary gland normal. Vascular: Major intracranial vascular flow voids are maintained. Skull and upper cervical spine: Craniocervical junction within normal limits. Upper cervical spine normal. Bone marrow signal intensity within normal limits. No scalp soft tissue abnormality. Sinuses/Orbits: Globes and orbital soft tissues within normal limits. Mild mucosal thickening within the ethmoidal air cells and maxillary sinuses. Paranasal sinuses are otherwise clear. No mastoid effusion. Inner ear structures grossly normal. Other: None. MRA HEAD FINDINGS ANTERIOR CIRCULATION: Distal cervical segments of the internal carotid arteries are patent with antegrade flow. Petrous, cavernous, and supraclinoid segments widely patent without flow-limiting stenosis. Origin of the ophthalmic arteries patent bilaterally. ICA termini widely patent. A1 segments widely patent bilaterally.  Azygos ACA proximally, which subsequently divides into bilateral A2 segments, both of which are widely patent. M1 segments widely patent bilaterally. Normal MCA bifurcations. No proximal M2 occlusion. Distal MCA branches well perfused and symmetric. POSTERIOR CIRCULATION: Left vertebral artery dominant and widely patent to the vertebrobasilar junction. 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 Left PICA patent. Right PICA not visualized. Dominant right AICA. Basilar widely patent to its distal aspect. Superior cerebral arteries patent bilaterally. Right PCA supplied via the basilar. Fetal type origin of the left PCA. PCAs widely patent to their distal aspects without stenosis. No  intracranial aneurysm. IMPRESSION: MRI HEAD IMPRESSION: 1. No acute intracranial infarct or other abnormality identified. 2. Mild cerebral atrophy with chronic nonspecific cerebral white matter signal changes, mildly progressed relative to 2017. Findings most commonly related to sequelae of chronic small vessel ischemic disease. 3. 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 MRA HEAD IMPRESSION: 1. Negative intracranial MRA with no large vessel occlusion or hemodynamically significant stenosis identified. 2. 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 Electronically Signed   By: [name] M.D.   On: 03/26/2018 02:44   Mr Brain Wo Contrast  Result Date: 03/26/2018 CLINICAL DATA:  Initial evaluation for acute altered mental status, unclear cause. EXAM: MRI HEAD WITHOUT CONTRAST MRA HEAD WITHOUT CONTRAST TECHNIQUE: Multiplanar, multiecho pulse sequences of the brain and surrounding structures were obtained without intravenous contrast. Angiographic images of the head were obtained using MRA technique without contrast. COMPARISON:  Prior CT from 03/25/2018 as well as previous MRI from 06/09/2016. FINDINGS: MRI HEAD FINDINGS Brain: Mild generalized age-related cerebral atrophy. Patchy and confluent T2/FLAIR hyperintensities seen involving the periventricular, deep, and subcortical white matter both cerebral hemispheres, nonspecific, but most like related chronic small vessel ischemic change. These are mildly progressed relative to 2017. No abnormal foci of restricted diffusion to suggest acute or subacute ischemia. No encephalomalacia to suggest remote cortical infarction. No foci of susceptibility artifact to suggest acute or chronic intracranial hemorrhage. 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 No other mass lesion. No midline shift or mass  effect. No general swelling or edema to suggest encephalitis. Ventricles normal size without hydrocephalus. No extra-axial fluid collection. Pituitary gland normal. Vascular: Major intracranial vascular flow voids are maintained. Skull and upper cervical spine: Craniocervical junction within normal limits. Upper cervical spine normal. Bone marrow signal intensity within normal limits. No scalp soft tissue abnormality. Sinuses/Orbits: Globes and orbital soft tissues within normal limits. Mild mucosal thickening within the ethmoidal air cells and maxillary sinuses. Paranasal sinuses are otherwise clear. No mastoid effusion. Inner ear structures grossly normal. Other: None. MRA HEAD FINDINGS ANTERIOR CIRCULATION: Distal cervical segments of the internal carotid arteries are patent with antegrade flow. Petrous, cavernous, and supraclinoid segments widely patent without flow-limiting stenosis. Origin of the ophthalmic arteries patent bilaterally. ICA termini widely patent. A1 segments widely patent bilaterally. Azygos ACA proximally, which subsequently divides into bilateral A2 segments, both of which are widely patent. M1 segments widely patent bilaterally. Normal MCA bifurcations. No proximal M2 occlusion. Distal MCA branches well perfused and symmetric. POSTERIOR CIRCULATION: Left vertebral artery dominant and widely patent to the vertebrobasilar junction. 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 Left PICA patent. Right PICA not visualized. Dominant right AICA. Basilar widely patent to its distal aspect. Superior cerebral arteries patent bilaterally. Right PCA supplied via the basilar. Fetal type origin of  the left PCA. PCAs widely patent to their distal aspects without stenosis. No intracranial aneurysm. IMPRESSION: MRI HEAD IMPRESSION: 1. No acute intracranial infarct or other abnormality identified. 2. Mild cerebral atrophy with chronic nonspecific cerebral white matter signal changes, mildly progressed  relative to 2017. Findings most commonly related to sequelae of chronic small vessel ischemic disease. 3. 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 MRA HEAD IMPRESSION: 1. Negative intracranial MRA with no large vessel occlusion or hemodynamically significant stenosis identified. 2. 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 Electronically Signed   By: [name] M.D.   On: 03/26/2018 02:44   US Renal  Result Date: 03/24/2018 CLINICAL DATA:  Acute renal failure. EXAM: RENAL / URINARY TRACT ULTRASOUND COMPLETE COMPARISON:  CT scan of February 27, 2018. FINDINGS: Right Kidney: Length: 11.4 cm. 11 mm nonobstructive calculus is seen in upper pole. Echogenicity within normal limits. No mass or hydronephrosis visualized. Left Kidney: Length: 11.9 cm. Echogenicity within normal limits. No mass or hydronephrosis visualized. Bladder: Appears normal for degree of bladder distention. Bilateral ureteral jets are noted. Mild ascites is noted. IMPRESSION: Nonobstructive right renal calculus. Mild ascites is noted. No other renal abnormality is noted. Electronically Signed   By: Marijo Conception, M.D.   On: 03/24/2018 13:44   Dg Chest Port 1 View  Result Date: 03/26/2018 CLINICAL DATA:  Shortness of breath EXAM: PORTABLE CHEST 1 VIEW COMPARISON:  03/25/2018 FINDINGS: Cardiac shadow is enlarged but stable from the previous exam. Left jugular central line is noted in satisfactory position. No pneumothorax is noted. Left retrocardiac atelectasis is noted slightly increased when compared with the prior exam. No other focal abnormality is noted. IMPRESSION: Increasing left basilar atelectasis. Electronically Signed   By: Inez Catalina M.D.   On: 03/26/2018 06:48   Dg Chest Port 1 View  Result Date: 03/25/2018 CLINICAL DATA:  Acute respiratory distress EXAM: PORTABLE CHEST 1 VIEW  COMPARISON:  Chest radiograph from one day prior. FINDINGS: Left internal jugular central venous catheter terminates at the cavoatrial junction. Stable cardiomediastinal silhouette with mild cardiomegaly. No pneumothorax. No right pleural effusion. Probable stable small left pleural effusion. No overt pulmonary edema. Low lung volumes with stable left retrocardiac opacity. IMPRESSION: 1. Stable mild cardiomegaly without overt pulmonary edema. 2. Probable stable small left pleural effusion. 3. Low lung volumes with stable left retrocardiac opacity, favor atelectasis. Electronically Signed   By: Ilona Sorrel M.D.   On: 03/25/2018 01:39   Dg Chest Port 1 View  Result Date: 03/24/2018 CLINICAL DATA:  Central line placement EXAM: PORTABLE CHEST 1 VIEW COMPARISON:  Chest radiograph 03/24/2018 FINDINGS: Left IJ central venous catheter tip is at the cavoatrial junction. Cardiomegaly is unchanged. No focal airspace disease. No pneumothorax. IMPRESSION: Left IJ central venous catheter tip at the cavoatrial junction. No pneumothorax. Electronically Signed   By: Ulyses Jarred M.D.   On: 03/24/2018 14:56     Medications:   . ceFEPime (MAXIPIME) IV Stopped (03/25/18 1815)  . norepinephrine (LEVOPHED) Adult infusion Stopped (03/25/18 1400)  . phenylephrine (NEO-SYNEPHRINE) Adult infusion Stopped (03/25/18 1722)  . vancomycin     . abacavir  600 mg Oral Daily  . amiodarone  400 mg Oral BID  . aspirin EC  325 mg Oral Daily  . divalproex  500 mg Oral BID  . docusate sodium  100 mg Oral Daily  . dolutegravir  50 mg Oral  Daily  . feeding supplement (NEPRO CARB STEADY)  237 mL Oral BID BM  . iloperidone  12 mg Oral BID  . insulin aspart  0-9 Units Subcutaneous Q4H  . [START ON 03/27/2018] lamiVUDine  150 mg Oral Daily  . pantoprazole  40 mg Oral BID  . rosuvastatin  20 mg Oral Daily  . venlafaxine XR  75 mg Oral Q breakfast   acetaminophen **OR** acetaminophen, diphenhydrAMINE, ondansetron **OR** ondansetron  (ZOFRAN) IV, oxyCODONE, sodium chloride flush  Assessment/ Plan:  57 y.o. 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   1.  Acute renal failure, likely multifactorial Patient has history of nonsteroidal use until about a month ago.  He also has a right kidney stone 11 mm that was nonobstructive as per CT done on August 26.  He was evaluated for hypotension, dizziness and similar complaints about a month ago.  Other differential diagnosis includes ATN due to hypotension in the setting of taking ACE inhibitors.  Patient states that he has not missed his HIV medications therefore HIV-associated nephropathy less likely Urinalysis is unremarkable.  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  GFR greater than 60.   Managed with ACE inhibitors Restart low dose  3.  Acute pericarditis Small effusion per recent echo  4. Hypokalemia -Electrolyte replacement as per ICU protocols.  5. OSA - May need CPAP at night .   6. Diabetes - Outpatient metformin     LOS: [place] 9/27/201910:36 AM  [place], [place]  Note: This note was prepared with Dragon dictation. Any transcription errors are unintentional

## 2018-03-26 NOTE — Progress Notes (Signed)
Patient ID: Danny Cook., male   DOB: Jan 10, 1961, 57 y.o.   MRN: [id number]   I was called for a consultation regarding a pericardial effusion on this patient.  I did discuss his care yesterday with Dr. Donette Larry his cardiologist and also with Dr. Jamal Collin today the intensivist.  At the present time I have been asked to hold on any consultation until further notice.  It would appear on the basis of the information I have been able to obtain that the pericardial effusion is small and that he is not hemodynamically unstable.

## 2018-03-26 NOTE — Progress Notes (Signed)
Danny Cook. is a 58 y.o. male with history of HIV, CHF, chronic kidney disease] focal sclerosis glomerular nephritis is admitted with dizziness, nausea, headaches for the past few days. As per patient he had a headache for the past month or so. Recently he started to feel dizzy along with nausea. He also felt hot but did not check his temperature. He went to his PCPs office on 03/23/2018 and his blood pressure was 100/68, heart rate of 107, oxygen saturation of 92%. He received a injection of Toradol 30 mg IM for headache and was asked to hold the Jardiance and Lasix and was asked to go to the emergency department . In the ED was found to have a creatinine of 4.52, anion gap of 14, bicarb of 21 his temperature was 98.2 blood pressure was 66/36. Cultures were sent he also had an EKG which showed changes in the inferior and anterior leads suggestive of an MI and cardiology was consulted they thought it could be due to pericarditis he also had an echo at bedside which shows pericardial fluid. Patient does not have any chest pain. He has been short of breath for the past few weeks.  He has presented to the ED quite a few times in August. On August 3 it was for jaw pain .on August 4 he presented for intractable headache. During that visit his vitals were blood pressure 130/74 heart rate of 105 and temperature of 98.5 CD4 was done on that date and it was 612 with 36%. He was diagnosed as migraine. He was discharged on Fioricet. On August 26 his creatinine was 1.18. He was started on Jardiance which is a DPP 4 by his endocrinologist on August 28. He presented to [facility] ED on August 31 complaining of lightheadedness and generalized weakness. His creatinine from August 31st was 2.86. He was given IV fluids and discharged. He saw his PCP on 9/24 /2019 and was asked to get admitted.  I am asked to see the patient for HIV.  He is currently in ICU and is getting pressors for low blood pressure. He had transient  A. fib and was given amiodarone he has reversed back to sinus rhythm.  He has been seen by cardiologist, nephrologist, intensivist and hospitalist. He is also on vancomycin and cefepime.  Patient states he has felt hot but never checked his temperature.  He has had a chronic cough for almost a month but nonproductive.  He has been short of breath on minimal exertion.  He also has orthopnea and especially when he lies flat he has cough and gets short of breath.  Patient states he was diagnosed with HIV many years ago on a routine blood test which was done as he was donating blood.   Subjective Doing very well No fever in 24 hrs Says his anxiety is coming from his psychiatrist reducing his med from 2 mg to 0.5 mg No headache Some sob  OBJECTIVE:  BP 132/83 (BP Location: Left Arm)   Pulse 64   Temp 97.6 F (36.4 C) (Oral)   Resp (!) 24   Ht 5' 7"  (1.702 m)   Wt 100.2 kg   SpO2 93%   BMI 34.61 kg/m    Physical Exam  Constitutional: Well-nourished, no obvious distress,  Eyes: Pupils reacting to light  No tenderness or cord thickening over the temporal region b/l ENT: Oral cavity normal with no exudate, no nasal congestion, no sinus tenderness  CVS: S1-S2 tachycardia  RS: Bilateral  air entry  GI: Abdomen soft distention no tenderness  GU  MSK moves all limbs, ankle edema, no joint swelling  SKIN: No rash  Neurologic: Nonfocal examination cranial nerves intact  Psychiatric: Normal affect  Lymphatic: No adenopathy palpated  Lab Results  CBC Latest Ref Rng & Units 03/26/2018 03/25/2018 03/24/2018  WBC 3.8 - 10.6 K/uL 9.2 9.3 11.9(H)  Hemoglobin 13.0 - 18.0 g/dL 9.9(L) 8.9(L) 10.8(L)  Hematocrit 40.0 - 52.0 % 28.5(L) 25.3(L) 32.1(L)  Platelets 150 - 440 K/uL 135(L) 113(L) 140(L)   CMP Latest Ref Rng & Units 03/26/2018 03/25/2018 03/24/2018  Glucose 70 - 99 mg/dL 208(H) 100(H) 72  BUN 6 - 20 mg/dL 40(H) 39(H) 41(H)  Creatinine 0.61 - 1.24 mg/dL 1.83(H) 2.84(H) 3.92(H)  Sodium 135 -  145 mmol/L 142 138 140  Potassium 3.5 - 5.1 mmol/L 4.6 3.5 3.3(L)  Chloride 98 - 111 mmol/L 111 109 105  CO2 22 - 32 mmol/L 18(L) 19(L) 22  Calcium 8.9 - 10.3 mg/dL 7.9(L) 6.9(L) 7.6(L)  Total Protein 6.5 - 8.1 g/dL 6.7 6.0(L) -  Total Bilirubin 0.3 - 1.2 mg/dL 0.5 0.7 -  Alkaline Phos 38 - 126 U/L 66 47 -  AST 15 - 41 U/L 41 34 -  ALT 0 - 44 U/L 31 18 -      Assessment and Plan  57 y.o. male with history of HIV, CHF, chronic kidney disease] focal sclerosis glomera nephritis is admitted with dizziness, nausea, headaches for the past few days. As per patient he had a headache for the past month or so. Recently he started to feel dizzy along with nausea. He also felt hot but did not check his temperature. He went to his PCPs office on 03/23/2018 and was and his blood pressure was 100/68, heart rate of 107, oxygen saturation of 92%. He received a injection of Toradol 30 mg IM for headache and was asked to hold the Jardiance and Lasix and was asked to go to the emergency department .   Fever- resolved clinically this is not meningitis or encephalitis- DC acyclovir and ampicillin  on 9/26 Currently on cefepime and vanco.  blood culture from 9/24 negative so far discontinue antibiotics tomorrow if no fever and cultures remain negative . Pericardial effusion-small- not sure of etiology  could be from CKD- no intervention planned now  Headache, nausea, shortness of breath  Not sure what the reason for his worsening  kidney failure. But there are only some medications which could have aggravated the condition and also with underlying renal failure could have caused side effects including metformin, Jardiance.  Also he is on Triumeq for HIV which is a fixed drug combination of dolutegravir, Epivir and abacavir. The Epivir dose will have to be adjusted for creatinine clearance . changed the single pill to its separate components   AKI on CKD  Avoid nephrotoxic drug   Hypotension: resolved Likely  related to hypovolemia, medications.  Sepsis was originally questioned but clincially no source  Procalcitonin is mildly elevated but that could be high in kidney failure.    Pericardial effusion- - uremic pericardial effusion VS other causes including infection/autoimmune.  ESR is high > 130 -with headache Temporal arteritis unlikely has  temporal tenderness . Will repeat ESR   HIV- well controlled -says he never had KS.  Last cd4 612 and Vl 20- adjusted the dose of his HAARt  DM- was on multiple meds- GLP A1, SGLTP2, Metformin, insulin   CHF  HTN- lasix, lisinopril  Discussed  the management with the patient and care team Will not round on him this weekend .   Tsosie Billing, MD

## 2018-03-26 NOTE — Progress Notes (Signed)
Subjective: Patient states that he is feeling fine denies any complaint or issues overnight.  His only complaint today is decreased appetite. No significant event reported by nursing staff.  Objective: Current vital signs: BP 132/83 (BP Location: Left Arm)   Pulse 64   Temp 97.6 F (36.4 C) (Oral)   Resp (!) 24   Ht 5\' 7"  (1.702 m)   Wt 100.2 kg   SpO2 93%   BMI 34.61 kg/m  Vital signs in last 24 hours: Temp:  [97.6 F (36.4 C)-98.5 F (36.9 C)] 97.6 F (36.4 C) (09/27 0745) Pulse Rate:  [48-94] 64 (09/27 0745) Resp:  [14-30] 24 (09/27 0745) BP: (102-132)/(66-84) 132/83 (09/27 0745) SpO2:  [91 %-95 %] 93 % (09/27 0745)  Intake/Output from previous day: 09/26 0701 - 09/27 0700 In: 1005.8 [I.V.:391.6; NG/GT:240; IV Piggyback:374.2] Out: 5300 [Urine:5300] Intake/Output this shift: Total I/O In: -  Out: 525 [Urine:525] Nutritional status:  Diet Order            Diet heart healthy/carb modified Room service appropriate? Yes; Fluid consistency: Thin  Diet effective now             Neurological Exam   Mental Status: Alert, oriented, thought content appropriate. Speech fluent without evidence of aphasia. Able to follow 3 step commands without difficulty. Attention span and concentration seemed appropriate  Cranial Nerves: II: Discs flat bilaterally; Visual fields grossly normal, pupils equal, round, reactive to light and accommodation III,IV, VI: ptosis not present, extra-ocular motions intact bilaterally V,VII: smile symmetric, facial light touch sensationintact VIII: hearing normal bilaterally IX,X: gag reflex present XI: bilateral shoulder shrug XII: midline tongue extension Motor: Right :Upper extremity 5/5Without pronator driftLeft: Upper extremity 5/5 without pronator drift Right:Lower extremity 5/5Left: Lower extremity 5/5 Tone and bulk:normal tone throughout; no atrophy noted Sensory: Pinprick  and light touch intact bialterally Deep Tendon Reflexes: 2+ and symmetric throughout Plantars: Right:muteLeft: mute Cerebellar: Finger-to-nosetesting intact bilaterally.Heel to shin testing normal bilaterally Gait: not tested due to safety concerns   Lab Results: Basic Metabolic Panel: Recent Labs  Lab 03/23/18 1847 03/24/18 0204 03/25/18 0108 03/26/18 0347  NA 136 140 138 142  K 4.3 3.3* 3.5 4.6  CL 101 105 109 111  CO2 21* 22 19* 18*  GLUCOSE 163* 72 100* 208*  BUN 41* 41* 39* 40*  CREATININE 4.52* 3.92* 2.84* 1.83*  CALCIUM 8.3* 7.6* 6.9* 7.9*  MG 1.9 2.0 1.9 2.6*  PHOS  --   --  3.3 2.9    Liver Function Tests: Recent Labs  Lab 03/23/18 1847 03/25/18 0108 03/26/18 0347  AST 22 34 41  ALT 13 18 31   ALKPHOS 73 47 66  BILITOT 0.6 0.7 0.5  PROT 7.5 6.0* 6.7  ALBUMIN 3.5 2.6* 2.9*   Recent Labs  Lab 03/23/18 1847  LIPASE 34   Recent Labs  Lab 03/25/18 0543 03/26/18 0638  AMMONIA 15 12    CBC: Recent Labs  Lab 03/23/18 1847 03/23/18 2341 03/24/18 0204 03/25/18 0108 03/26/18 0638  WBC 11.3* 9.8 11.9* 9.3 9.2  NEUTROABS  --  5.5  --  6.1  --   HGB 11.2* 9.5* 10.8* 8.9* 9.9*  HCT 32.8* 29.5* 32.1* 25.3* 28.5*  MCV 93.4 95 94.6 92.2 92.6  PLT 164 156 140* 113* 135*    Cardiac Enzymes: Recent Labs  Lab 03/23/18 2341 03/24/18 0204 03/24/18 0731 03/25/18 0539 03/25/18 0902 03/25/18 1344 03/26/18 0347  CKTOTAL  --   --  877*  --   --   --  373  TROPONINI <0.03 0.03*  --  0.15* 0.12* 0.09*  --     Lipid Panel: No results for input(s): CHOL, TRIG, HDL, CHOLHDL, VLDL, LDLCALC in the last 168 hours.  CBG: Recent Labs  Lab 03/25/18 1638 03/25/18 1952 03/25/18 2323 03/26/18 0353 03/26/18 0752  GLUCAP 201* 237* 181* 74* 166*    Microbiology: Results for orders placed or performed during the hospital encounter of 03/23/18  MRSA PCR Screening     Status: None   Collection Time: 03/23/18 10:46 PM   Result Value Ref Range Status   MRSA by PCR NEGATIVE NEGATIVE Final    Comment:        The GeneXpert MRSA Assay (FDA approved for NASAL specimens only), is one component of a comprehensive MRSA colonization surveillance program. It is not intended to diagnose MRSA infection nor to guide or monitor treatment for MRSA infections. Performed at [facility], [address] [place], [place] [address]   CULTURE, BLOOD (ROUTINE X 2) w Reflex to ID Panel     Status: None (Preliminary result)   Collection Time: 03/23/18 11:44 PM  Result Value Ref Range Status   Specimen Description BLOOD LEFT ANTECUBITAL  Final   Special Requests   Final    BOTTLES DRAWN AEROBIC AND ANAEROBIC Blood Culture adequate volume   Culture   Final    NO GROWTH 2 DAYS Performed at [facility], [address], [place], [place] [address]    Report Status PENDING  Incomplete  CULTURE, BLOOD (ROUTINE X 2) w Reflex to ID Panel     Status: None (Preliminary result)   Collection Time: 03/23/18 11:52 PM  Result Value Ref Range Status   Specimen Description BLOOD BLOOD LEFT HAND  Final   Special Requests   Final    BOTTLES DRAWN AEROBIC AND ANAEROBIC Blood Culture adequate volume   Culture   Final    NO GROWTH 2 DAYS Performed at [facility], [address], [place], [place] [address]    Report Status PENDING  Incomplete    Coagulation Studies: Recent Labs    03/23/18 2041/10/21 03/25/18 0108 03/25/18 0539  LABPROT 16.5* 15.3* 15.0  INR 1.34 1.22 1.19    Imaging: Ct Head Wo Contrast  Result Date: 03/25/2018 CLINICAL DATA:  Initial evaluation for acute altered mental status. EXAM: CT HEAD WITHOUT CONTRAST TECHNIQUE: Contiguous axial images were obtained from the base of the skull through the vertex without intravenous contrast. COMPARISON:  Prior CT from 07/31/2017. FINDINGS: Brain: Generalized age-related cerebral atrophy with mild chronic small vessel ischemic disease, stable. No  acute intracranial hemorrhage. No acute large vessel territory infarct. No mass lesion, midline shift or mass effect no hydrocephalus. No extra-axial fluid collection. Vascular: No hyperdense vessel. Scattered vascular calcifications noted within the carotid siphons. Skull: Scalp soft tissues within normal limits.  Calvarium intact. Sinuses/Orbits: Globes and orbital soft tissues demonstrate no acute finding. Mild scattered mucosal thickening within the ethmoidal air cells. Paranasal sinuses are otherwise clear. No mastoid effusion. Other: None. IMPRESSION: 1. No acute intracranial abnormality. 2. Mild age-related cerebral atrophy with chronic small vessel ischemic disease, stable. Electronically Signed   By: [name] M.D.   On: 03/25/2018 03:03   Mr Jodene Nam Head Wo Contrast  Result Date: 03/26/2018 CLINICAL DATA:  Initial evaluation for acute altered mental status, unclear cause. EXAM: MRI HEAD WITHOUT CONTRAST MRA HEAD WITHOUT CONTRAST TECHNIQUE: Multiplanar, multiecho pulse sequences of the brain and surrounding structures were obtained without intravenous contrast.  Angiographic images of the head were obtained using MRA technique without contrast. COMPARISON:  Prior CT from 03/25/2018 as well as previous MRI from 06/09/2016. FINDINGS: MRI HEAD FINDINGS Brain: Mild generalized age-related cerebral atrophy. Patchy and confluent T2/FLAIR hyperintensities seen involving the periventricular, deep, and subcortical white matter both cerebral hemispheres, nonspecific, but most like related chronic small vessel ischemic change. These are mildly progressed relative to 2017. No abnormal foci of restricted diffusion to suggest acute or subacute ischemia. No encephalomalacia to suggest remote cortical infarction. No foci of susceptibility artifact to suggest acute or chronic intracranial hemorrhage. 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 No other mass lesion. No midline shift or mass  effect. No general swelling or edema to suggest encephalitis. Ventricles normal size without hydrocephalus. No extra-axial fluid collection. Pituitary gland normal. Vascular: Major intracranial vascular flow voids are maintained. Skull and upper cervical spine: Craniocervical junction within normal limits. Upper cervical spine normal. Bone marrow signal intensity within normal limits. No scalp soft tissue abnormality. Sinuses/Orbits: Globes and orbital soft tissues within normal limits. Mild mucosal thickening within the ethmoidal air cells and maxillary sinuses. Paranasal sinuses are otherwise clear. No mastoid effusion. Inner ear structures grossly normal. Other: None. MRA HEAD FINDINGS ANTERIOR CIRCULATION: Distal cervical segments of the internal carotid arteries are patent with antegrade flow. Petrous, cavernous, and supraclinoid segments widely patent without flow-limiting stenosis. Origin of the ophthalmic arteries patent bilaterally. ICA termini widely patent. A1 segments widely patent bilaterally. Azygos ACA proximally, which subsequently divides into bilateral A2 segments, both of which are widely patent. M1 segments widely patent bilaterally. Normal MCA bifurcations. No proximal M2 occlusion. Distal MCA branches well perfused and symmetric. POSTERIOR CIRCULATION: Left vertebral artery dominant and widely patent to the vertebrobasilar junction. 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 Left PICA patent. Right PICA not visualized. Dominant right AICA. Basilar widely patent to its distal aspect. Superior cerebral arteries patent bilaterally. Right PCA supplied via the basilar. Fetal type origin of the left PCA. PCAs widely patent to their distal aspects without stenosis. No intracranial aneurysm. IMPRESSION: MRI HEAD IMPRESSION: 1. No acute intracranial infarct or other abnormality identified. 2. Mild cerebral atrophy with chronic nonspecific cerebral white matter signal changes, mildly progressed  relative to 2017. Findings most commonly related to sequelae of chronic small vessel ischemic disease. 3. 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 MRA HEAD IMPRESSION: 1. Negative intracranial MRA with no large vessel occlusion or hemodynamically significant stenosis identified. 2. 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 Electronically Signed   By: [name] M.D.   On: 03/26/2018 02:44   Mr Brain Wo Contrast  Result Date: 03/26/2018 CLINICAL DATA:  Initial evaluation for acute altered mental status, unclear cause. EXAM: MRI HEAD WITHOUT CONTRAST MRA HEAD WITHOUT CONTRAST TECHNIQUE: Multiplanar, multiecho pulse sequences of the brain and surrounding structures were obtained without intravenous contrast. Angiographic images of the head were obtained using MRA technique without contrast. COMPARISON:  Prior CT from 03/25/2018 as well as previous MRI from 06/09/2016. FINDINGS: MRI HEAD FINDINGS Brain: Mild generalized age-related cerebral atrophy. Patchy and confluent T2/FLAIR hyperintensities seen involving the periventricular, deep, and subcortical white matter both cerebral hemispheres, nonspecific, but most like related chronic small vessel ischemic change. These are mildly progressed relative to 2017. No abnormal foci of restricted diffusion to suggest acute or subacute ischemia. No encephalomalacia to suggest remote cortical infarction.  No foci of susceptibility artifact to suggest acute or chronic intracranial hemorrhage. 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 No other mass lesion. No midline shift or mass effect. No general swelling or edema to suggest encephalitis. Ventricles normal size without hydrocephalus. No extra-axial fluid collection. Pituitary gland normal. Vascular: Major intracranial vascular flow voids are  maintained. Skull and upper cervical spine: Craniocervical junction within normal limits. Upper cervical spine normal. Bone marrow signal intensity within normal limits. No scalp soft tissue abnormality. Sinuses/Orbits: Globes and orbital soft tissues within normal limits. Mild mucosal thickening within the ethmoidal air cells and maxillary sinuses. Paranasal sinuses are otherwise clear. No mastoid effusion. Inner ear structures grossly normal. Other: None. MRA HEAD FINDINGS ANTERIOR CIRCULATION: Distal cervical segments of the internal carotid arteries are patent with antegrade flow. Petrous, cavernous, and supraclinoid segments widely patent without flow-limiting stenosis. Origin of the ophthalmic arteries patent bilaterally. ICA termini widely patent. A1 segments widely patent bilaterally. Azygos ACA proximally, which subsequently divides into bilateral A2 segments, both of which are widely patent. M1 segments widely patent bilaterally. Normal MCA bifurcations. No proximal M2 occlusion. Distal MCA branches well perfused and symmetric. POSTERIOR CIRCULATION: Left vertebral artery dominant and widely patent to the vertebrobasilar junction. 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 Left PICA patent. Right PICA not visualized. Dominant right AICA. Basilar widely patent to its distal aspect. Superior cerebral arteries patent bilaterally. Right PCA supplied via the basilar. Fetal type origin of the left PCA. PCAs widely patent to their distal aspects without stenosis. No intracranial aneurysm. IMPRESSION: MRI HEAD IMPRESSION: 1. No acute intracranial infarct or other abnormality identified. 2. Mild cerebral atrophy with chronic nonspecific cerebral white matter signal changes, mildly progressed relative to 2017. Findings most commonly related to sequelae of chronic small vessel ischemic disease. 3. 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 MRA HEAD  IMPRESSION: 1. Negative intracranial MRA with no large vessel occlusion or hemodynamically significant stenosis identified. 2. 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 Electronically Signed   By: [name] M.D.   On: 03/26/2018 02:44   US Renal  Result Date: 03/24/2018 CLINICAL DATA:  Acute renal failure. EXAM: RENAL / URINARY TRACT ULTRASOUND COMPLETE COMPARISON:  CT scan of February 27, 2018. FINDINGS: Right Kidney: Length: 11.4 cm. 11 mm nonobstructive calculus is seen in upper pole. Echogenicity within normal limits. No mass or hydronephrosis visualized. Left Kidney: Length: 11.9 cm. Echogenicity within normal limits. No mass or hydronephrosis visualized. Bladder: Appears normal for degree of bladder distention. Bilateral ureteral jets are noted. Mild ascites is noted. IMPRESSION: Nonobstructive right renal calculus. Mild ascites is noted. No other renal abnormality is noted. Electronically Signed   By: Marijo Conception, M.D.   On: 03/24/2018 13:44   US Carotid Bilateral  Result Date: 03/26/2018 CLINICAL DATA:  57 year old male with altered mental status EXAM: BILATERAL CAROTID DUPLEX ULTRASOUND TECHNIQUE: Pearline Cables scale imaging, color Doppler and duplex ultrasound were performed of bilateral carotid and vertebral arteries in the neck. COMPARISON:  Brain MRI 03/26/2018; prior carotid duplex ultrasound 12/14/2007 FINDINGS: Criteria: Quantification of carotid stenosis is based on velocity parameters that correlate the residual internal carotid diameter with NASCET-based stenosis levels, using the diameter of the distal internal carotid lumen as the denominator for stenosis measurement. The following velocity measurements were obtained: RIGHT ICA: 78/28 cm/sec CCA: 61/60 cm/sec SYSTOLIC ICA/CCA RATIO:  1.0 ECA:  70 cm/sec LEFT ICA: 89/32 cm/sec CCA: 43/32 cm/sec SYSTOLIC ICA/CCA RATIO:  1.3 ECA:  120 cm/sec RIGHT  CAROTID ARTERY: No significant atherosclerotic plaque or evidence of stenosis. RIGHT VERTEBRAL ARTERY:  Patent with normal antegrade flow. LEFT CAROTID ARTERY: Trace heterogeneous atherosclerotic plaque at the origin of the internal carotid artery. By peak systolic velocity criteria, the estimated stenosis remains less than 50%. LEFT VERTEBRAL ARTERY:  Patent with normal antegrade flow. IMPRESSION: 1. 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 2. No evidence of atherosclerotic plaque or stenosis in the right internal carotid artery. 3. Vertebral arteries are patent with normal antegrade flow. Signed, Criselda Peaches, MD, [facility] Electronically Signed   By: Jacqulynn Cadet M.D.   On: 03/26/2018 11:13   Dg Chest Port 1 View  Result Date: 03/26/2018 CLINICAL DATA:  Shortness of breath EXAM: PORTABLE CHEST 1 VIEW COMPARISON:  03/25/2018 FINDINGS: Cardiac shadow is enlarged but stable from the previous exam. Left jugular central line is noted in satisfactory position. No pneumothorax is noted. Left retrocardiac atelectasis is noted slightly increased when compared with the prior exam. No other focal abnormality is noted. IMPRESSION: Increasing left basilar atelectasis. Electronically Signed   By: Inez Catalina M.D.   On: 03/26/2018 06:48   Dg Chest Port 1 View  Result Date: 03/25/2018 CLINICAL DATA:  Acute respiratory distress EXAM: PORTABLE CHEST 1 VIEW COMPARISON:  Chest radiograph from one day prior. FINDINGS: Left internal jugular central venous catheter terminates at the cavoatrial junction. Stable cardiomediastinal silhouette with mild cardiomegaly. No pneumothorax. No right pleural effusion. Probable stable small left pleural effusion. No overt pulmonary edema. Low lung volumes with stable left retrocardiac opacity. IMPRESSION: 1. Stable mild cardiomegaly  without overt pulmonary edema. 2. Probable stable small left pleural effusion. 3. Low lung volumes with stable left retrocardiac opacity, favor atelectasis. Electronically Signed   By: Ilona Sorrel M.D.   On: 03/25/2018 01:39   Dg Chest Port 1 View  Result Date: 03/24/2018 CLINICAL DATA:  Central line placement EXAM: PORTABLE CHEST 1 VIEW COMPARISON:  Chest radiograph 03/24/2018 FINDINGS: Left IJ central venous catheter tip is at the cavoatrial junction. Cardiomegaly is unchanged. No focal airspace disease. No pneumothorax. IMPRESSION: Left IJ central venous catheter tip at the cavoatrial junction. No pneumothorax. Electronically Signed   By: Ulyses Jarred M.D.   On: 03/24/2018 14:56    Medications:  I have reviewed the patient's current medications. Prior to Admission:  Medications Prior to Admission  Medication Sig Dispense Refill Last Dose  . Abacavir-Dolutegravir-Lamivud (TRIUMEQ) 600-50-300 MG TABS Take 1 tablet by mouth daily.   01/30/2018 at 0830  . aspirin 325 MG tablet Take 325 mg by mouth daily.   01/30/2018 at 0830  . butalbital-acetaminophen-caffeine (FIORICET, ESGIC) 50-325-40 MG tablet Take 1-2 tablets by mouth every 6 (six) hours as needed. 20 tablet 0   . clonazePAM (KLONOPIN) 1 MG tablet Take 0.5-1 mg by mouth 3 (three) times daily. Take 1 mg in the morning, 0.5 mg mid-day and 1 mg in the evening.   01/30/2018 at 0830  . cyanocobalamin 1000 MCG tablet Take 1,000 mcg by mouth daily.   01/30/2018 at 0830  . diphenhydrAMINE (BENADRYL) 50 MG capsule Take 1 capsule (50 mg total) by mouth at bedtime as needed for sleep. 30 capsule 0 PRN at PRN  . divalproex (DEPAKOTE ER) 250 MG 24 hr tablet Take 500 mg by mouth 2 (two)  times daily.    01/30/2018 at 0830  . FANAPT 12 MG TABS Take 12 mg by mouth 2 (two) times daily.    01/30/2018 at 0830  . furosemide (LASIX) 20 MG tablet Take 10 mg by mouth daily.   01/30/2018 at 0830  . gabapentin (NEURONTIN) 300 MG capsule Take 300 mg by mouth 3 (three) times  daily.   01/30/2018 at 0830  . gemfibrozil (LOPID) 600 MG tablet Take 1 tablet by mouth 2 (two) times daily.   01/30/2018 at 0830  . Insulin Degludec (TRESIBA FLEXTOUCH [place]) Inject 94 Units into the skin daily.   01/29/2018 at 0800  . liraglutide (VICTOZA) 18 MG/3ML SOPN Inject 1.8 mg into the skin daily.   01/29/2018 at 0800  . lisinopril (PRINIVIL,ZESTRIL) 20 MG tablet Take 20 mg by mouth daily.   01/30/2018 at 0830  . metFORMIN (GLUCOPHAGE) 1000 MG tablet Take 1,000 mg by mouth 2 (two) times daily with a meal.   01/30/2018 at 0830  . mirtazapine (REMERON) 30 MG tablet Take 30 mg by mouth at bedtime.   01/29/2018 at 2000  . omeprazole (PRILOSEC) 40 MG capsule Take 40 mg by mouth 2 (two) times daily.   01/30/2018 at 0830  . promethazine (PHENERGAN) 6.25 MG/5ML syrup Take 6.25 mg by mouth every 6 (six) hours as needed for nausea or vomiting.   PRN at PRN  . rosuvastatin (CRESTOR) 20 MG tablet Take 20 mg by mouth daily.   01/30/2018 at 0830  . sucralfate (CARAFATE) 1 g tablet Take 1 g by mouth 2 (two) times daily.   01/30/2018 at 0830  . triamcinolone cream (KENALOG) 0.1 % Apply 1 application topically 2 (two) times daily as needed. 30 g 0 PRN at PRN  . venlafaxine XR (EFFEXOR-XR) 75 MG 24 hr capsule Take 1 capsule (75 mg total) by mouth daily with breakfast. 30 capsule 0 01/30/2018 at 0830  . Insulin Degludec 200 UNIT/ML SOPN Inject 20 Units into the skin daily. (Patient not taking: Reported on 01/30/2018)   Not Taking at Unknown time  . meclizine (ANTIVERT) 25 MG tablet Take 1 tablet (25 mg total) by mouth 3 (three) times daily as needed for dizziness or nausea. (Patient not taking: Reported on 03/23/2018) 30 tablet 1 Not Taking at Unknown time  . ondansetron (ZOFRAN ODT) 4 MG disintegrating tablet Take 1 tablet (4 mg total) by mouth every 8 (eight) hours as needed for nausea or vomiting. (Patient not taking: Reported on 03/23/2018) 20 tablet 0 Not Taking at Unknown time   Scheduled: . abacavir  600 mg Oral Daily  .  amiodarone  400 mg Oral BID  . aspirin EC  325 mg Oral Daily  . divalproex  500 mg Oral BID  . docusate sodium  100 mg Oral Daily  . dolutegravir  50 mg Oral Daily  . enalapril  2.5 mg Oral Daily  . feeding supplement (NEPRO CARB STEADY)  237 mL Oral BID BM  . iloperidone  12 mg Oral BID  . insulin aspart  0-9 Units Subcutaneous Q4H  . [START ON 03/27/2018] lamiVUDine  150 mg Oral Daily  . pantoprazole  40 mg Oral BID  . rosuvastatin  20 mg Oral Daily  . venlafaxine XR  75 mg Oral Q breakfast    Patient seen and examined.  Clinical course and management discussed.  Necessary edits performed.  I agree with the above.  Assessment and plan of care developed and discussed below.    Assessment:  Patient  stable with no focality.  MRI of the brain reviewed and shows no acute intracranial abnormalities, MRA head did not show evidence of large vessel occlusion.  Ultrasound carotids bilaterally did not show any hemodynamically significant stenosis.  EEG was normal with no evidence of epileptiform discharges.  Echocardiogram normal with no source of emboli, EF of 55%to 60%.  Recommendation Agree with continuing with current medical management.  No further neurologic intervention is recommended at this time.  If further questions arise, please call or page at that time.  Thank you for allowing neurology to participate in the care of this patient.  This patient was staffed with Dr. Magda Paganini, Doy Mince who personally evaluated patient, reviewed documentation and agreed with assessment and plan of care as above.  Rufina Falco, DNP, FNP-BC Board certified Nurse Practitioner Neurology Department   LOS: 3 days    03/26/2018  11:17 AM   Alexis Goodell, MD Neurology [phone]  03/26/2018  12:22 PM

## 2018-03-27 LAB — GLUCOSE, CAPILLARY
GLUCOSE-CAPILLARY: 173 mg/dL — AB (ref 70–99)
Glucose-Capillary: 125 mg/dL — ABNORMAL HIGH (ref 70–99)
Glucose-Capillary: 122 mg/dL — ABNORMAL HIGH (ref 70–99)
Glucose-Capillary: 223 mg/dL — ABNORMAL HIGH (ref 70–99)

## 2018-03-27 LAB — BASIC METABOLIC PANEL
Anion gap: 7 (ref 5–15)
BUN: 43 mg/dL — AB (ref 6–20)
CALCIUM: 8.3 mg/dL — AB (ref 8.9–10.3)
CO2: 22 mmol/L (ref 22–32)
CREATININE: 1.54 mg/dL — AB (ref 0.61–1.24)
Chloride: 113 mmol/L — ABNORMAL HIGH (ref 98–111)
GFR calc Af Amer: 55 mL/min — ABNORMAL LOW (ref >60)
GFR calc non Af Amer: 48 mL/min — ABNORMAL LOW (ref >60)
Glucose, Bld: 138 mg/dL — ABNORMAL HIGH (ref 70–99)
Potassium: 3.9 mmol/L (ref 3.5–5.1)
SODIUM: 142 mmol/L (ref 135–145)

## 2018-03-27 MED ORDER — ENALAPRIL MALEATE 2.5 MG PO TABS: 2.5000 mg | ORAL_TABLET | Freq: Every day | ORAL | 0 refills | Status: DC

## 2018-03-27 MED ORDER — NEPRO/CARBSTEADY PO LIQD: 237.0000 mL | Freq: Two times a day (BID) | ORAL | 0 refills | Status: DC

## 2018-03-27 MED ORDER — AMIODARONE HCL 200 MG PO TABS: ORAL_TABLET | ORAL | 0 refills | Status: DC

## 2018-03-27 MED ORDER — ABACAVIR-DOLUTEGRAVIR-LAMIVUD 600-50-300 MG PO TABS: 1.0000 | ORAL_TABLET | Freq: Every day | ORAL | Status: DC

## 2018-03-27 MED ORDER — INSULIN DEGLUDEC 100 UNIT/ML ~~LOC~~ SOPN: 20.0000 [IU] | PEN_INJECTOR | Freq: Every day | SUBCUTANEOUS | Status: DC

## 2018-03-27 NOTE — Progress Notes (Signed)
LCSW introduced myself to patient and discussed his level of care needs. The patient no longer wishes for an ALF or [place] at this time. LCSW did provided patient with Navigation hand out which clearly discussed different types of care facilities based on patients needs and insurance and criteria for admission. The patient was very friendly and this LCSW was thanked and he declined any further service needs at this time.  Consulted with Floor nurse and she reported he is ready to go.  [employer] LCSW [phone]

## 2018-03-27 NOTE — Discharge Instructions (Signed)

## 2018-03-27 NOTE — Progress Notes (Signed)
Pharmacist - Prescriber Communication  Renal function has improved to a point that Triumeq can be resumed (lamivudine component needed to be reduced for CrCl but now >50 ml/min). Discontinue the abacavir 600 daily, dolutegravir 50 daily, and lamivudine O/S 150 daily and resume PTA Triumeq 600-50-300 once daily.   Alfie Alderfer A. [place], [place].D., BCPS Clinical Pharmacist 03/27/2018 09:40

## 2018-03-27 NOTE — Discharge Summary (Signed)
[facility] [place] NAME: Danny Cook    MR#:  [id number]  DATE OF BIRTH:  Jan 17, 1961  DATE OF ADMISSION:  03/23/2018 ADMITTING PHYSICIAN: Nicholes Mango, MD  DATE OF DISCHARGE: 03/27/2018  2:00 PM  PRIMARY CARE PHYSICIAN: [facility]    ADMISSION DIAGNOSIS:  Cough [R05] Dehydration [E86.0] Generalized weakness [R53.1] Hypotension, unspecified hypotension type [I95.9] Acute renal failure, unspecified acute renal failure type ([place]) [N17.9]  DISCHARGE DIAGNOSIS:  Principal Problem:   Pericarditis with effusion Active Problems:   Chronic diastolic heart failure (HCC)   HTN (hypertension)   Diabetes (HCC)   Schizoaffective disorder, depressive type (HCC)   HIV positive (HCC)   AKI (acute kidney injury) ([place])   SECONDARY DIAGNOSIS:   Past Medical History:  Diagnosis Date  . Anemia   . Anxiety   . Arthritis   . CHF (congestive heart failure) ([place])   . Chronic kidney disease    Renal Insufficiency Syndrome; Glomerulosclerosis 2013  . Complication of anesthesia   . Depressed   . Diabetes mellitus without complication ([place])   . High cholesterol   . HIV (human immunodeficiency virus infection) ([place])   . Hypertension   . 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 Paranoid disorder ([place])   . Schizophrenia, paranoid ([place])   . Sleep apnea     HOSPITAL COURSE:   1.  Acute on chronic renal failure.  Patient's baseline creatinine 1.13.  Patient's creatinine was 4.52 on presentation and came down to 1.54 upon discharge.  Cleared by nephrology to restart low-dose enalapril. 2.  Suspected sepsis.  This was ruled out all cultures are negative antibiotics were discontinued. 3.  Acute pericarditis.  Patient had pericardial effusion but no tamponade physiology.  Repeat echocardiogram as outpatient 4.  Acute encephalopathy.  CT scan of the head, MRI of the brain and MRA of the brain was negative for acute pathology.  Carotid ultrasound negative.   EEG negative for seizures.  Mental status improved. 5.  Hypotension on presentation this has improved.  Off pressors. 6.  Atrial fibrillation with rapid ventricular response.  Placed on amiodarone.  I prescribed amiodarone upon going home.  Aspirin for anticoagulation 7.  History of HIV on [place] treatment.  Follow-up with ID as outpatient.  Seen by ID here in the hospital 8.  Type 2 diabetes mellitus.  Decrease dose of Tresiba insulin down to 20 units.  Monitor sugars closely.  Patient also takes metformin and Victoza.  Since creatinine has improved can go back on these medications as outpatient. 9.  History of paranoid schizophrenia, bipolar disorder and depression.  Continue psychiatric medications 10.  I walked the patient around the nursing station myself.  He did well with a cane.  He is interested in setting up home health.  DISCHARGE CONDITIONS:   Satisfactory  CONSULTS OBTAINED:  Treatment Team:  Isaias Cowman, MD Murlean Iba, MD Tsosie Billing, MD Emmaline Kluver., MD Alexis Goodell, MD  DRUG ALLERGIES:   Allergies  Allergen Reactions  . Trazodone And Nefazodone   . Viread [Tenofovir Disoproxil]     Damages kidneys  . Etodolac Rash    DISCHARGE MEDICATIONS:   Allergies as of 03/27/2018      Reactions   Trazodone And Nefazodone    Viread [tenofovir Disoproxil]    Damages kidneys   Etodolac Rash      Medication List    STOP taking these medications   furosemide 20 MG tablet Commonly known as:  LASIX   gabapentin 300 MG capsule Commonly known as:  NEURONTIN   gemfibrozil 600 MG tablet Commonly known as:  LOPID   Insulin Degludec 200 UNIT/ML Sopn   lisinopril 20 MG tablet Commonly known as:  PRINIVIL,ZESTRIL   meclizine 25 MG tablet Commonly known as:  ANTIVERT   mirtazapine 30 MG tablet Commonly known as:  REMERON   ondansetron 4 MG disintegrating tablet Commonly known as:  ZOFRAN-ODT   sucralfate 1 g tablet Commonly  known as:  CARAFATE   TRESIBA FLEXTOUCH [place] Replaced by:  insulin degludec 100 UNIT/ML Sopn FlexTouch Pen     TAKE these medications   amiodarone 200 MG tablet Commonly known as:  PACERONE Two tabs po twice a day for two days then one tablet twice a day afterwards   aspirin 325 MG tablet Take 325 mg by mouth daily.   butalbital-acetaminophen-caffeine 50-325-40 MG tablet Commonly known as:  FIORICET, ESGIC Take 1-2 tablets by mouth every 6 (six) hours as needed.   clonazePAM 1 MG tablet Commonly known as:  KLONOPIN Take 0.5-1 mg by mouth 3 (three) times daily. Take 1 mg in the morning, 0.5 mg mid-day and 1 mg in the evening.   cyanocobalamin 1000 MCG tablet Take 1,000 mcg by mouth daily.   diphenhydrAMINE 50 MG capsule Commonly known as:  BENADRYL Take 1 capsule (50 mg total) by mouth at bedtime as needed for sleep.   divalproex 250 MG 24 hr tablet Commonly known as:  DEPAKOTE ER Take 500 mg by mouth 2 (two) times daily.   enalapril 2.5 MG tablet Commonly known as:  VASOTEC Take 1 tablet (2.5 mg total) by mouth daily. Start taking on:  03/28/2018   FANAPT 12 MG Tabs Generic drug:  Iloperidone Take 12 mg by mouth 2 (two) times daily.   feeding supplement (NEPRO CARB STEADY) Liqd Take 237 mLs by mouth 2 (two) times daily between meals.   insulin degludec 100 UNIT/ML Sopn FlexTouch Pen Commonly known as:  TRESIBA Inject 0.2 mLs (20 Units total) into the skin daily. Replaces:  TRESIBA FLEXTOUCH    liraglutide 18 MG/3ML Sopn Commonly known as:  VICTOZA Inject 1.8 mg into the skin daily.   metFORMIN 1000 MG tablet Commonly known as:  GLUCOPHAGE Take 1,000 mg by mouth 2 (two) times daily with a meal.   omeprazole 40 MG capsule Commonly known as:  PRILOSEC Take 40 mg by mouth 2 (two) times daily.   promethazine 6.25 MG/5ML syrup Commonly known as:  PHENERGAN Take 6.25 mg by mouth every 6 (six) hours as needed for nausea or vomiting.   rosuvastatin 20 MG  tablet Commonly known as:  CRESTOR Take 20 mg by mouth daily.   triamcinolone cream 0.1 % Commonly known as:  KENALOG Apply 1 application topically 2 (two) times daily as needed.   TRIUMEQ 600-50-300 MG tablet Generic drug:  abacavir-dolutegravir-lamiVUDine Take 1 tablet by mouth daily.   venlafaxine XR 75 MG 24 hr capsule Commonly known as:  EFFEXOR-XR Take 1 capsule (75 mg total) by mouth daily with breakfast.        DISCHARGE INSTRUCTIONS:   Follow-up PMD 5 days Follow-up with cardiology, nephrology and infectious disease as outpatient  If you experience worsening of your admission symptoms, develop shortness of breath, life threatening emergency, suicidal or homicidal thoughts you must seek medical attention immediately by calling 911 or calling your MD immediately  if symptoms less severe.  You Must read complete instructions/literature along with all the possible adverse  reactions/side effects for all the Medicines you take and that have been prescribed to you. Take any new Medicines after you have completely understood and accept all the possible adverse reactions/side effects.   Please note  You were cared for by a hospitalist during your hospital stay. If you have any questions about your discharge medications or the care you received while you were in the hospital after you are discharged, you can call the unit and asked to speak with the hospitalist on call if the hospitalist that took care of you is not available. Once you are discharged, your primary care physician will handle any further medical issues. Please note that NO REFILLS for any discharge medications will be authorized once you are discharged, as it is imperative that you return to your primary care physician (or establish a relationship with a primary care physician if you do not have one) for your aftercare needs so that they can reassess your need for medications and monitor your lab values.    Today    CHIEF COMPLAINT:   Chief Complaint  Patient presents with  . Dehydration    HISTORY OF PRESENT ILLNESS:  Danny Cook  is a 57 y.o. male came in with dehydration   VITAL SIGNS:  Blood pressure 118/72, pulse 79, temperature (!) 97.4 F (36.3 C), temperature source Oral, resp. rate 20, height 5\' 7"  (1.702 m), weight 98.9 kg, SpO2 96 %.    PHYSICAL EXAMINATION:  GENERAL:  57 y.o.-year-old patient lying in the bed with no acute distress.  EYES: Pupils equal, round, reactive to light and accommodation. No scleral icterus. Extraocular muscles intact.  HEENT: Head atraumatic, normocephalic. Oropharynx and nasopharynx clear.  NECK:  Supple, no jugular venous distention. No thyroid enlargement, no tenderness.  LUNGS: Normal breath sounds bilaterally, no wheezing, rales,rhonchi or crepitation. No use of accessory muscles of respiration.  CARDIOVASCULAR: S1, S2 normal. No murmurs, rubs, or gallops.  ABDOMEN: Soft, non-tender, non-distended. Bowel sounds present. No organomegaly or mass.  EXTREMITIES: No pedal edema, cyanosis, or clubbing.  NEUROLOGIC: Cranial nerves II through XII are intact. Muscle strength 5/5 in all extremities. Sensation intact. Gait not checked.  PSYCHIATRIC: The patient is alert and oriented x 3.  SKIN: No obvious rash, lesion, or ulcer.   DATA REVIEW:   CBC Recent Labs  Lab 03/26/18 0638  WBC 9.2  HGB 9.9*  HCT 28.5*  PLT 135*    Chemistries  Recent Labs  Lab 03/26/18 0347 03/27/18 0447  NA 142 142  K 4.6 3.9  CL 111 113*  CO2 18* 22  GLUCOSE 208* 138*  BUN 40* 43*  CREATININE 1.83* 1.54*  CALCIUM 7.9* 8.3*  MG 2.6*  --   AST 41  --   ALT 31  --   ALKPHOS 66  --   BILITOT 0.5  --     Cardiac Enzymes Recent Labs  Lab 03/25/18 1344  TROPONINI 0.09*    Microbiology Results  Results for orders placed or performed during the hospital encounter of 03/23/18  MRSA PCR Screening     Status: None   Collection Time: 03/23/18 10:46 PM   Result Value Ref Range Status   MRSA by PCR NEGATIVE NEGATIVE Final    Comment:        The GeneXpert MRSA Assay (FDA approved for NASAL specimens only), is one component of a comprehensive MRSA colonization surveillance program. It is not intended to diagnose MRSA infection nor to guide or monitor treatment for MRSA infections.  Performed at [facility], [address] [place], [place] [address]   CULTURE, BLOOD (ROUTINE X 2) w Reflex to ID Panel     Status: None (Preliminary result)   Collection Time: 03/23/18 11:44 PM  Result Value Ref Range Status   Specimen Description BLOOD LEFT ANTECUBITAL  Final   Special Requests   Final    BOTTLES DRAWN AEROBIC AND ANAEROBIC Blood Culture adequate volume   Culture   Final    NO GROWTH 3 DAYS Performed at [facility], [address] [place], [place] [address]    Report Status PENDING  Incomplete  CULTURE, BLOOD (ROUTINE X 2) w Reflex to ID Panel     Status: None (Preliminary result)   Collection Time: 03/23/18 11:52 PM  Result Value Ref Range Status   Specimen Description BLOOD BLOOD LEFT HAND  Final   Special Requests   Final    BOTTLES DRAWN AEROBIC AND ANAEROBIC Blood Culture adequate volume   Culture   Final    NO GROWTH 3 DAYS Performed at [facility], [address], [place], [place] [address]    Report Status PENDING  Incomplete    RADIOLOGY:  Mr Virgel Paling CW Contrast  Result Date: 03/26/2018 CLINICAL DATA:  Initial evaluation for acute altered mental status, unclear cause. EXAM: MRI HEAD WITHOUT CONTRAST MRA HEAD WITHOUT CONTRAST TECHNIQUE: Multiplanar, multiecho pulse sequences of the brain and surrounding structures were obtained without intravenous contrast. Angiographic images of the head were obtained using MRA technique without contrast. COMPARISON:  Prior CT from 03/25/2018 as well as previous MRI from 06/09/2016. FINDINGS: MRI HEAD FINDINGS Brain: Mild generalized age-related cerebral  atrophy. Patchy and confluent T2/FLAIR hyperintensities seen involving the periventricular, deep, and subcortical white matter both cerebral hemispheres, nonspecific, but most like related chronic small vessel ischemic change. These are mildly progressed relative to 2017. No abnormal foci of restricted diffusion to suggest acute or subacute ischemia. No encephalomalacia to suggest remote cortical infarction. No foci of susceptibility artifact to suggest acute or chronic intracranial hemorrhage. 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 No other mass lesion. No midline shift or mass effect. No general swelling or edema to suggest encephalitis. Ventricles normal size without hydrocephalus. No extra-axial fluid collection. Pituitary gland normal. Vascular: Major intracranial vascular flow voids are maintained. Skull and upper cervical spine: Craniocervical junction within normal limits. Upper cervical spine normal. Bone marrow signal intensity within normal limits. No scalp soft tissue abnormality. Sinuses/Orbits: Globes and orbital soft tissues within normal limits. Mild mucosal thickening within the ethmoidal air cells and maxillary sinuses. Paranasal sinuses are otherwise clear. No mastoid effusion. Inner ear structures grossly normal. Other: None. MRA HEAD FINDINGS ANTERIOR CIRCULATION: Distal cervical segments of the internal carotid arteries are patent with antegrade flow. Petrous, cavernous, and supraclinoid segments widely patent without flow-limiting stenosis. Origin of the ophthalmic arteries patent bilaterally. ICA termini widely patent. A1 segments widely patent bilaterally. Azygos ACA proximally, which subsequently divides into bilateral A2 segments, both of which are widely patent. M1 segments widely patent bilaterally. Normal MCA bifurcations. No proximal M2 occlusion. Distal MCA branches well perfused and symmetric. POSTERIOR CIRCULATION: Left vertebral artery dominant and widely  patent to the vertebrobasilar junction. 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 Left PICA patent. Right PICA not visualized. Dominant right AICA. Basilar widely patent to its distal aspect. Superior cerebral arteries patent bilaterally. Right PCA supplied via the basilar. Fetal type origin of the left PCA. PCAs widely patent  to their distal aspects without stenosis. No intracranial aneurysm. IMPRESSION: MRI HEAD IMPRESSION: 1. No acute intracranial infarct or other abnormality identified. 2. Mild cerebral atrophy with chronic nonspecific cerebral white matter signal changes, mildly progressed relative to 2017. Findings most commonly related to sequelae of chronic small vessel ischemic disease. 3. 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 MRA HEAD IMPRESSION: 1. Negative intracranial MRA with no large vessel occlusion or hemodynamically significant stenosis identified. 2. 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 Electronically Signed   By: [name] M.D.   On: 03/26/2018 02:44   Mr Brain Wo Contrast  Result Date: 03/26/2018 CLINICAL DATA:  Initial evaluation for acute altered mental status, unclear cause. EXAM: MRI HEAD WITHOUT CONTRAST MRA HEAD WITHOUT CONTRAST TECHNIQUE: Multiplanar, multiecho pulse sequences of the brain and surrounding structures were obtained without intravenous contrast. Angiographic images of the head were obtained using MRA technique without contrast. COMPARISON:  Prior CT from 03/25/2018 as well as previous MRI from 06/09/2016. FINDINGS: MRI HEAD FINDINGS Brain: Mild generalized age-related cerebral atrophy. Patchy and confluent T2/FLAIR hyperintensities seen involving the periventricular, deep, and subcortical white matter both cerebral hemispheres, nonspecific, but most like related chronic small vessel ischemic change.  These are mildly progressed relative to 2017. No abnormal foci of restricted diffusion to suggest acute or subacute ischemia. No encephalomalacia to suggest remote cortical infarction. No foci of susceptibility artifact to suggest acute or chronic intracranial hemorrhage. 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 No other mass lesion. No midline shift or mass effect. No general swelling or edema to suggest encephalitis. Ventricles normal size without hydrocephalus. No extra-axial fluid collection. Pituitary gland normal. Vascular: Major intracranial vascular flow voids are maintained. Skull and upper cervical spine: Craniocervical junction within normal limits. Upper cervical spine normal. Bone marrow signal intensity within normal limits. No scalp soft tissue abnormality. Sinuses/Orbits: Globes and orbital soft tissues within normal limits. Mild mucosal thickening within the ethmoidal air cells and maxillary sinuses. Paranasal sinuses are otherwise clear. No mastoid effusion. Inner ear structures grossly normal. Other: None. MRA HEAD FINDINGS ANTERIOR CIRCULATION: Distal cervical segments of the internal carotid arteries are patent with antegrade flow. Petrous, cavernous, and supraclinoid segments widely patent without flow-limiting stenosis. Origin of the ophthalmic arteries patent bilaterally. ICA termini widely patent. A1 segments widely patent bilaterally. Azygos ACA proximally, which subsequently divides into bilateral A2 segments, both of which are widely patent. M1 segments widely patent bilaterally. Normal MCA bifurcations. No proximal M2 occlusion. Distal MCA branches well perfused and symmetric. POSTERIOR CIRCULATION: Left vertebral artery dominant and widely patent to the vertebrobasilar junction. 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 Left PICA patent. Right PICA not visualized. Dominant right AICA. Basilar widely patent to its distal aspect. Superior cerebral arteries  patent bilaterally. Right PCA supplied via the basilar. Fetal type origin of the left PCA. PCAs widely patent to their distal aspects without stenosis. No intracranial aneurysm. IMPRESSION: MRI HEAD IMPRESSION: 1. No acute intracranial infarct or other abnormality identified. 2. Mild cerebral atrophy with chronic nonspecific cerebral white matter signal changes, mildly progressed relative to 2017. Findings most commonly related to sequelae of chronic small vessel ischemic disease. 3. 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 MRA HEAD IMPRESSION: 1. Negative intracranial MRA with no large vessel occlusion or hemodynamically significant stenosis identified. 2. 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 Electronically Signed   By: [name] M.D.   On: 03/26/2018 02:44   US Carotid Bilateral  Result Date: 03/26/2018 CLINICAL DATA:  57 year old male with altered mental status EXAM: BILATERAL CAROTID DUPLEX ULTRASOUND TECHNIQUE: Pearline Cables scale imaging, color Doppler and duplex ultrasound were performed of bilateral carotid and vertebral arteries in the neck. COMPARISON:  Brain MRI 03/26/2018; prior carotid duplex ultrasound 12/14/2007 FINDINGS: Criteria: Quantification of carotid stenosis is based on velocity parameters that correlate the residual internal carotid diameter with NASCET-based stenosis levels, using the diameter of the distal internal carotid lumen as the denominator for stenosis measurement. The following velocity measurements were obtained: RIGHT ICA: 78/28 cm/sec CCA: 81/44 cm/sec SYSTOLIC ICA/CCA RATIO:  1.0 ECA:  70 cm/sec LEFT ICA: 89/32 cm/sec CCA: 81/85 cm/sec SYSTOLIC ICA/CCA RATIO:  1.3 ECA:  120 cm/sec RIGHT CAROTID ARTERY: No significant atherosclerotic plaque or evidence of stenosis. RIGHT VERTEBRAL ARTERY:  Patent with normal antegrade flow.  LEFT CAROTID ARTERY: Trace heterogeneous atherosclerotic plaque at the origin of the internal carotid artery. By peak systolic velocity criteria, the estimated stenosis remains less than 50%. LEFT VERTEBRAL ARTERY:  Patent with normal antegrade flow. IMPRESSION: 1. 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 2. No evidence of atherosclerotic plaque or stenosis in the right internal carotid artery. 3. Vertebral arteries are patent with normal antegrade flow. Signed, Criselda Peaches, MD, [facility] Electronically Signed   By: Jacqulynn Cadet M.D.   On: 03/26/2018 11:13   Dg Chest Port 1 View  Result Date: 03/26/2018 CLINICAL DATA:  Shortness of breath EXAM: PORTABLE CHEST 1 VIEW COMPARISON:  03/25/2018 FINDINGS: Cardiac shadow is enlarged but stable from the previous exam. Left jugular central line is noted in satisfactory position. No pneumothorax is noted. Left retrocardiac atelectasis is noted slightly increased when compared with the prior exam. No other focal abnormality is noted. IMPRESSION: Increasing left basilar atelectasis. Electronically Signed   By: Inez Catalina M.D.   On: 03/26/2018 06:48      Management plans discussed with the patient, family and they are in agreement.  CODE STATUS:     Code Status Orders  (From admission, onward)         Start     Ordered   03/23/18 2123  Full code  Continuous     03/23/18 2122        Code Status History    Date Active Date Inactive Code Status Order ID Comments User Context   11/04/2017 2050 11/09/2017 1400 Full Code [id number]  Gonzella Lex, MD Inpatient   07/31/2017 0240 08/01/2017 1952 Full Code [id number]  [name], MD ED      TOTAL TIME TAKING CARE OF THIS PATIENT: 35 minutes.    Loletha Grayer M.D on 03/27/2018 at 4:41 PM  Between 7am to 6pm - Pager - [phone]  After 6pm go  to [email or web address] - Proofreader  [employer] Office  [phone]  CC: Primary care physician; Katheren Shams

## 2018-03-27 NOTE — Care Management Note (Signed)
Case Management Note  Patient Details  Name: Danny Cook. MRN: [id number] Date of Birth: 1960-11-27  Subjective/Objective:   Patient to be discharged per MD order. Orders in place for home health services. Patient agreeable to home health services and has recently just closed with [facility]. Referral placed with [place] from [facility] and she agrees to accept the patient for PT,RN and aide services. There are no DME needs. Family to provide transport. Ines Bloomer RN BSN RNCM [phone]                  Action/Plan:   Expected Discharge Date:  03/27/18               Expected Discharge Plan:  [place]  In-House Referral:     Discharge planning Services  CM Consult  Post Acute Care Choice:  Home Health Choice offered to:  Patient  DME Arranged:    DME Agency:     HH Arranged:  RN, PT, Nurse's Aide [place] Agency:  [facility]  Status of Service:  Completed, signed off  If discussed at [place] of Stay Meetings, dates discussed:    Additional Comments:  Latanya Maudlin, RN 03/27/2018, 2:39 PM

## 2018-03-27 NOTE — Progress Notes (Signed)
Pt discharged home with father. Pt escorted to vehicle in wheelchair by nursing staff.

## 2018-03-27 NOTE — Progress Notes (Signed)
Discharge instructions given. Pt verbalizes understanding. Pt states that he has home health currently in place ( [facility] ) and has spoken with them this am. States they are aware of his discharge today and will follow up with him in the am. Pt states that he has his medications and insulin pen at home. Pt verbalizes understanding of information given.

## 2018-03-27 NOTE — Progress Notes (Signed)
[place], [place] 03/27/18  Subjective:   Patient is doing well.  Sitting up in the chair.  Able to eat without nausea or vomiting. Serum creatinine has improved to 1.54 Urine output greater than 2000 cc last 24 hours   Objective:  Vital signs in last 24 hours:  Temp:  [97.6 F (36.4 C)-98.1 F (36.7 C)] 98.1 F (36.7 C) (09/28 0411) Pulse Rate:  [45-79] 45 (09/28 0411) Resp:  [14-22] 14 (09/28 0411) BP: (101-139)/(70-78) 114/78 (09/28 0848) SpO2:  [93 %-97 %] 97 % (09/28 0411) Weight:  [98.9 kg] 98.9 kg (09/27 1616)  Weight change:  Filed Weights   03/23/18 1839 03/26/18 1616  Weight: 100.2 kg 98.9 kg    Intake/Output:    Intake/Output Summary (Last 24 hours) at 03/27/2018 1205 Last data filed at 03/27/2018 1159 Gross per 24 hour  Intake 996.27 ml  Output 2125 ml  Net -1128.73 ml     Physical Exam: General:  No acute distress,  HEENT  anicteric, moist oral mucous membranes  Neck  supple  Pulm/lungs  normal breathing effort, clear to ausculation  CVS/Heart  regular  Abdomen:   Soft, nontender  Extremities:  No significant edema  Neurologic:  Alert, oriented, speech normal, able to answer questions             Basic Metabolic Panel:  Recent Labs  Lab 03/23/18 1847 03/24/18 0204 03/25/18 0108 03/26/18 0347 03/27/18 0447  NA 136 140 138 142 142  K 4.3 3.3* 3.5 4.6 3.9  CL 101 105 109 111 113*  CO2 21* 22 19* 18* 22  GLUCOSE 163* 72 100* 208* 138*  BUN 41* 41* 39* 40* 43*  CREATININE 4.52* 3.92* 2.84* 1.83* 1.54*  CALCIUM 8.3* 7.6* 6.9* 7.9* 8.3*  MG 1.9 2.0 1.9 2.6*  --   PHOS  --   --  3.3 2.9  --      CBC: Recent Labs  Lab 03/23/18 1847 03/23/18 2341 03/24/18 0204 03/25/18 0108 03/26/18 0638  WBC 11.3* 9.8 11.9* 9.3 9.2  NEUTROABS  --  5.5  --  6.1  --   HGB 11.2* 9.5* 10.8* 8.9* 9.9*  HCT 32.8* 29.5* 32.1* 25.3* 28.5*  MCV 93.4 95 94.6 92.2 92.6  PLT 164 156 140* 113* 135*     No results found  for: HEPBSAG, HEPBSAB, HEPBIGM    Microbiology:  Recent Results (from the past 240 hour(s))  MRSA PCR Screening     Status: None   Collection Time: 03/23/18 10:46 PM  Result Value Ref Range Status   MRSA by PCR NEGATIVE NEGATIVE Final    Comment:        The GeneXpert MRSA Assay (FDA approved for NASAL specimens only), is one component of a comprehensive MRSA colonization surveillance program. It is not intended to diagnose MRSA infection nor to guide or monitor treatment for MRSA infections. Performed at [facility], [address] [place], [place] [address]   CULTURE, BLOOD (ROUTINE X 2) w Reflex to ID Panel     Status: None (Preliminary result)   Collection Time: 03/23/18 11:44 PM  Result Value Ref Range Status   Specimen Description BLOOD LEFT ANTECUBITAL  Final   Special Requests   Final    BOTTLES DRAWN AEROBIC AND ANAEROBIC Blood Culture adequate volume   Culture   Final    NO GROWTH 3 DAYS Performed at [facility], [address], [place], [place] [address]    Report Status  PENDING  Incomplete  CULTURE, BLOOD (ROUTINE X 2) w Reflex to ID Panel     Status: None (Preliminary result)   Collection Time: 03/23/18 11:52 PM  Result Value Ref Range Status   Specimen Description BLOOD BLOOD LEFT HAND  Final   Special Requests   Final    BOTTLES DRAWN AEROBIC AND ANAEROBIC Blood Culture adequate volume   Culture   Final    NO GROWTH 3 DAYS Performed at [facility], [address] [place], [place] [address]    Report Status PENDING  Incomplete    Coagulation Studies: Recent Labs    03/25/18 0108 03/25/18 0539  LABPROT 15.3* 15.0  INR 1.22 1.19    Urinalysis: No results for input(s): COLORURINE, LABSPEC, PHURINE, GLUCOSEU, HGBUR, BILIRUBINUR, KETONESUR, PROTEINUR, UROBILINOGEN, NITRITE, LEUKOCYTESUR in the last 72 hours.  Invalid input(s): APPERANCEUR    Imaging: Mr Virgel Paling KG Contrast  Result Date: 03/26/2018 CLINICAL  DATA:  Initial evaluation for acute altered mental status, unclear cause. EXAM: MRI HEAD WITHOUT CONTRAST MRA HEAD WITHOUT CONTRAST TECHNIQUE: Multiplanar, multiecho pulse sequences of the brain and surrounding structures were obtained without intravenous contrast. Angiographic images of the head were obtained using MRA technique without contrast. COMPARISON:  Prior CT from 03/25/2018 as well as previous MRI from 06/09/2016. FINDINGS: MRI HEAD FINDINGS Brain: Mild generalized age-related cerebral atrophy. Patchy and confluent T2/FLAIR hyperintensities seen involving the periventricular, deep, and subcortical white matter both cerebral hemispheres, nonspecific, but most like related chronic small vessel ischemic change. These are mildly progressed relative to 2017. No abnormal foci of restricted diffusion to suggest acute or subacute ischemia. No encephalomalacia to suggest remote cortical infarction. No foci of susceptibility artifact to suggest acute or chronic intracranial hemorrhage. 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 No other mass lesion. No midline shift or mass effect. No general swelling or edema to suggest encephalitis. Ventricles normal size without hydrocephalus. No extra-axial fluid collection. Pituitary gland normal. Vascular: Major intracranial vascular flow voids are maintained. Skull and upper cervical spine: Craniocervical junction within normal limits. Upper cervical spine normal. Bone marrow signal intensity within normal limits. No scalp soft tissue abnormality. Sinuses/Orbits: Globes and orbital soft tissues within normal limits. Mild mucosal thickening within the ethmoidal air cells and maxillary sinuses. Paranasal sinuses are otherwise clear. No mastoid effusion. Inner ear structures grossly normal. Other: None. MRA HEAD FINDINGS ANTERIOR CIRCULATION: Distal cervical segments of the internal carotid arteries are patent with antegrade flow. Petrous, cavernous, and  supraclinoid segments widely patent without flow-limiting stenosis. Origin of the ophthalmic arteries patent bilaterally. ICA termini widely patent. A1 segments widely patent bilaterally. Azygos ACA proximally, which subsequently divides into bilateral A2 segments, both of which are widely patent. M1 segments widely patent bilaterally. Normal MCA bifurcations. No proximal M2 occlusion. Distal MCA branches well perfused and symmetric. POSTERIOR CIRCULATION: Left vertebral artery dominant and widely patent to the vertebrobasilar junction. 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 Left PICA patent. Right PICA not visualized. Dominant right AICA. Basilar widely patent to its distal aspect. Superior cerebral arteries patent bilaterally. Right PCA supplied via the basilar. Fetal type origin of the left PCA. PCAs widely patent to their distal aspects without stenosis. No intracranial aneurysm. IMPRESSION: MRI HEAD IMPRESSION: 1. No acute intracranial infarct or other abnormality identified. 2. Mild cerebral atrophy with chronic nonspecific cerebral white matter signal changes, mildly progressed relative to 2017. Findings most commonly related to sequelae of chronic small vessel ischemic disease. 3. Incidental small  congenital midline lipoma, unchanged, and felt to be of no clinical significance. MRA HEAD IMPRESSION: 1. Negative intracranial MRA with no large vessel occlusion or hemodynamically significant stenosis identified. 2. 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 Electronically Signed   By: [name] M.D.   On: 03/26/2018 02:44   Mr Brain Wo Contrast  Result Date: 03/26/2018 CLINICAL DATA:  Initial evaluation for acute altered mental status, unclear cause. EXAM: MRI HEAD WITHOUT CONTRAST MRA HEAD WITHOUT CONTRAST TECHNIQUE: Multiplanar, multiecho pulse sequences of the brain and surrounding structures were  obtained without intravenous contrast. Angiographic images of the head were obtained using MRA technique without contrast. COMPARISON:  Prior CT from 03/25/2018 as well as previous MRI from 06/09/2016. FINDINGS: MRI HEAD FINDINGS Brain: Mild generalized age-related cerebral atrophy. Patchy and confluent T2/FLAIR hyperintensities seen involving the periventricular, deep, and subcortical white matter both cerebral hemispheres, nonspecific, but most like related chronic small vessel ischemic change. These are mildly progressed relative to 2017. No abnormal foci of restricted diffusion to suggest acute or subacute ischemia. No encephalomalacia to suggest remote cortical infarction. No foci of susceptibility artifact to suggest acute or chronic intracranial hemorrhage. 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 No other mass lesion. No midline shift or mass effect. No general swelling or edema to suggest encephalitis. Ventricles normal size without hydrocephalus. No extra-axial fluid collection. Pituitary gland normal. Vascular: Major intracranial vascular flow voids are maintained. Skull and upper cervical spine: Craniocervical junction within normal limits. Upper cervical spine normal. Bone marrow signal intensity within normal limits. No scalp soft tissue abnormality. Sinuses/Orbits: Globes and orbital soft tissues within normal limits. Mild mucosal thickening within the ethmoidal air cells and maxillary sinuses. Paranasal sinuses are otherwise clear. No mastoid effusion. Inner ear structures grossly normal. Other: None. MRA HEAD FINDINGS ANTERIOR CIRCULATION: Distal cervical segments of the internal carotid arteries are patent with antegrade flow. Petrous, cavernous, and supraclinoid segments widely patent without flow-limiting stenosis. Origin of the ophthalmic arteries patent bilaterally. ICA termini widely patent. A1 segments widely patent bilaterally. Azygos ACA proximally, which  subsequently divides into bilateral A2 segments, both of which are widely patent. M1 segments widely patent bilaterally. Normal MCA bifurcations. No proximal M2 occlusion. Distal MCA branches well perfused and symmetric. POSTERIOR CIRCULATION: Left vertebral artery dominant and widely patent to the vertebrobasilar junction. 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 Left PICA patent. Right PICA not visualized. Dominant right AICA. Basilar widely patent to its distal aspect. Superior cerebral arteries patent bilaterally. Right PCA supplied via the basilar. Fetal type origin of the left PCA. PCAs widely patent to their distal aspects without stenosis. No intracranial aneurysm. IMPRESSION: MRI HEAD IMPRESSION: 1. No acute intracranial infarct or other abnormality identified. 2. Mild cerebral atrophy with chronic nonspecific cerebral white matter signal changes, mildly progressed relative to 2017. Findings most commonly related to sequelae of chronic small vessel ischemic disease. 3. 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 MRA HEAD IMPRESSION: 1. Negative intracranial MRA with no large vessel occlusion or hemodynamically significant stenosis identified. 2. 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 Electronically Signed   By: [name] M.D.   On: 03/26/2018 02:44   US Carotid Bilateral  Result Date: 03/26/2018 CLINICAL DATA:  57 year old male with altered mental status EXAM: BILATERAL CAROTID DUPLEX ULTRASOUND TECHNIQUE: Pearline Cables scale  imaging, color Doppler and duplex ultrasound were performed of bilateral carotid and vertebral arteries in the neck. COMPARISON:  Brain MRI 03/26/2018; prior carotid duplex ultrasound 12/14/2007 FINDINGS: Criteria: Quantification of carotid stenosis is based on velocity parameters that correlate the residual internal carotid  diameter with NASCET-based stenosis levels, using the diameter of the distal internal carotid lumen as the denominator for stenosis measurement. The following velocity measurements were obtained: RIGHT ICA: 78/28 cm/sec CCA: 06/30 cm/sec SYSTOLIC ICA/CCA RATIO:  1.0 ECA:  70 cm/sec LEFT ICA: 89/32 cm/sec CCA: 16/01 cm/sec SYSTOLIC ICA/CCA RATIO:  1.3 ECA:  120 cm/sec RIGHT CAROTID ARTERY: No significant atherosclerotic plaque or evidence of stenosis. RIGHT VERTEBRAL ARTERY:  Patent with normal antegrade flow. LEFT CAROTID ARTERY: Trace heterogeneous atherosclerotic plaque at the origin of the internal carotid artery. By peak systolic velocity criteria, the estimated stenosis remains less than 50%. LEFT VERTEBRAL ARTERY:  Patent with normal antegrade flow. IMPRESSION: 1. 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 2. No evidence of atherosclerotic plaque or stenosis in the right internal carotid artery. 3. Vertebral arteries are patent with normal antegrade flow. Signed, Criselda Peaches, MD, [facility] Electronically Signed   By: Jacqulynn Cadet M.D.   On: 03/26/2018 11:13   Dg Chest Port 1 View  Result Date: 03/26/2018 CLINICAL DATA:  Shortness of breath EXAM: PORTABLE CHEST 1 VIEW COMPARISON:  03/25/2018 FINDINGS: Cardiac shadow is enlarged but stable from the previous exam. Left jugular central line is noted in satisfactory position. No pneumothorax is noted. Left retrocardiac atelectasis is noted slightly increased when compared with the prior exam. No other focal abnormality is noted. IMPRESSION: Increasing left basilar atelectasis. Electronically Signed   By: Inez Catalina M.D.   On: 03/26/2018 06:48     Medications:   . sodium chloride Stopped (03/27/18 0013)  . norepinephrine (LEVOPHED) Adult infusion Stopped (03/25/18 1400)   . [START ON 03/28/2018]  abacavir-dolutegravir-lamiVUDine  1 tablet Oral Daily  . amiodarone  400 mg Oral BID  . aspirin EC  325 mg Oral Daily  . clonazePAM  1 mg Oral TID  . divalproex  500 mg Oral BID  . docusate sodium  100 mg Oral Daily  . enalapril  2.5 mg Oral Daily  . feeding supplement (NEPRO CARB STEADY)  237 mL Oral BID BM  . iloperidone  12 mg Oral BID  . insulin aspart  0-9 Units Subcutaneous Q4H  . pantoprazole  40 mg Oral BID  . rosuvastatin  20 mg Oral Daily  . venlafaxine XR  75 mg Oral Q breakfast   sodium chloride, acetaminophen **OR** acetaminophen, diphenhydrAMINE, ondansetron **OR** ondansetron (ZOFRAN) IV, oxyCODONE, sodium chloride flush  Assessment/ Plan:  57 y.o. 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   1.  Acute renal failure, likely multifactorial Patient has history of nonsteroidal use until about a month ago.  He also has a right kidney stone 11 mm that was nonobstructive as per CT done on August 26.  He was evaluated for hypotension, dizziness and similar complaints about a month ago.  Other differential diagnosis includes ATN due to hypotension in the setting of taking ACE inhibitors.  Patient states that he has not missed his HIV medications therefore HIV-associated nephropathy less likely Urinalysis is unremarkable.  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  GFR greater than 60.   Managed with ACE inhibitors Continue low-dose Vasotec.  3.  Acute pericarditis Small effusion per recent echo Exhibited management  4. Hypokalemia -Level has improved now  5. OSA -continue CPAP at night .   6.  Diabetes -GFR 48.  Should be able to restart metformin     LOS: [place] 9/28/201912:05 PM  [place], [place]  Note: This note was prepared with Dragon dictation. Any transcription errors are unintentional

## 2018-03-29 ENCOUNTER — Other Ambulatory Visit: Payer: Self-pay

## 2018-03-29 ENCOUNTER — Emergency Department: Payer: Medicare Other

## 2018-03-29 ENCOUNTER — Emergency Department
Admission: EM | Admit: 2018-03-29 | Discharge: 2018-03-29 | Disposition: A | Discharge status: Home / Self Care | Payer: Medicare Other | Attending: Emergency Medicine | Admitting: Emergency Medicine

## 2018-03-29 DIAGNOSIS — F419 Anxiety disorder, unspecified: Secondary | ICD-10-CM | POA: Insufficient documentation

## 2018-03-29 DIAGNOSIS — N189 Chronic kidney disease, unspecified: Secondary | ICD-10-CM | POA: Diagnosis not present

## 2018-03-29 DIAGNOSIS — R635 Abnormal weight gain: Secondary | ICD-10-CM | POA: Diagnosis not present

## 2018-03-29 DIAGNOSIS — R0602 Shortness of breath: Secondary | ICD-10-CM | POA: Diagnosis not present

## 2018-03-29 DIAGNOSIS — Z21 Asymptomatic human immunodeficiency virus [HIV] infection status: Secondary | ICD-10-CM | POA: Diagnosis not present

## 2018-03-29 DIAGNOSIS — Z794 Long term (current) use of insulin: Secondary | ICD-10-CM | POA: Insufficient documentation

## 2018-03-29 DIAGNOSIS — I5032 Chronic diastolic (congestive) heart failure: Secondary | ICD-10-CM | POA: Insufficient documentation

## 2018-03-29 DIAGNOSIS — R2243 Localized swelling, mass and lump, lower limb, bilateral: Secondary | ICD-10-CM | POA: Diagnosis not present

## 2018-03-29 DIAGNOSIS — Z7982 Long term (current) use of aspirin: Secondary | ICD-10-CM | POA: Diagnosis not present

## 2018-03-29 DIAGNOSIS — I130 Hypertensive heart and chronic kidney disease with heart failure and stage 1 through stage 4 chronic kidney disease, or unspecified chronic kidney disease: Secondary | ICD-10-CM | POA: Diagnosis not present

## 2018-03-29 DIAGNOSIS — E1122 Type 2 diabetes mellitus with diabetic chronic kidney disease: Secondary | ICD-10-CM | POA: Diagnosis not present

## 2018-03-29 DIAGNOSIS — Z85831 Personal history of malignant neoplasm of soft tissue: Secondary | ICD-10-CM | POA: Diagnosis not present

## 2018-03-29 LAB — BRAIN NATRIURETIC PEPTIDE: B Natriuretic Peptide: 235 pg/mL — ABNORMAL HIGH (ref 0.0–100.0)

## 2018-03-29 LAB — CBC
HCT: 28.9 % — ABNORMAL LOW (ref 40.0–52.0)
Hemoglobin: 10.1 g/dL — ABNORMAL LOW (ref 13.0–18.0)
MCH: 32.4 pg (ref 26.0–34.0)
MCHC: 34.9 g/dL (ref 32.0–36.0)
MCV: 92.8 fL (ref 80.0–100.0)
PLATELETS: 277 10*3/uL (ref 150–440)
RBC: 3.11 MIL/uL — AB (ref 4.40–5.90)
RDW: 14.9 % — AB (ref 11.5–14.5)
WBC: 7.3 10*3/uL (ref 3.8–10.6)

## 2018-03-29 LAB — BASIC METABOLIC PANEL
Anion gap: 8 (ref 5–15)
BUN: 24 mg/dL — AB (ref 6–20)
CALCIUM: 9 mg/dL (ref 8.9–10.3)
CHLORIDE: 110 mmol/L (ref 98–111)
CO2: 22 mmol/L (ref 22–32)
CREATININE: 1.1 mg/dL (ref 0.61–1.24)
GFR calc non Af Amer: >60 mL/min (ref >60)
Glucose, Bld: 161 mg/dL — ABNORMAL HIGH (ref 70–99)
Potassium: 4.1 mmol/L (ref 3.5–5.1)
SODIUM: 140 mmol/L (ref 135–145)

## 2018-03-29 LAB — CULTURE, BLOOD (ROUTINE X 2)
CULTURE: NO GROWTH
CULTURE: NO GROWTH
SPECIAL REQUESTS: ADEQUATE
Special Requests: ADEQUATE

## 2018-03-29 LAB — TROPONIN I

## 2018-03-29 MED ORDER — FUROSEMIDE 40 MG PO TABS
20.0000 mg | ORAL_TABLET | Freq: Once | ORAL | Status: AC
  Administered 2018-03-29: 20 mg via ORAL
  Filled 2018-03-29: qty 1

## 2018-03-29 NOTE — Discharge Instructions (Addendum)
These follow-up with your cardiologist tomorrow if you have shortness of breath chest pain or you change your mind about further evaluation please return to the emergency room.  We will give you a dose of Lasix here.  They stopped her Lasix because your kidneys were injured during her last admission.  You do need to call your cardiologist and your regular doctor tomorrow for further clarification and what they want you to continue taking.  If you have leg swelling or shortness of breath, or chest pain or any other concerns return to the emergency room.

## 2018-03-29 NOTE — ED Provider Notes (Signed)
[facility] Emergency Department Provider Note  ____________________________________________   I have reviewed the triage vital signs and the nursing notes. Where available I have reviewed prior notes and, if possible and indicated, outside hospital notes.    HISTORY  Chief Complaint Shortness of Breath (wt gain)    HPI Danny Volante. is a 57 y.o. male presents today complaining of feeling anxious.  Patient states he has been feeling anxious since he got discharged from the hospital he has no SI or HI, he states when he gets anxious he feels short of breath.  Patient does not like where he is been staying.  However, since arriving in this emergency department he called a place that he does like to stay and they said that he can go back and stay there.  Now that that has been conveyed to him he states he no longer feels anxious has no shortness of breath has no complaints and wants to go home.  He denies any chest pain.  He states he only has shortness of breath when he is anxious about his living arrangement.  He denies any exertional symptoms.  He denies any fever chills nausea vomiting diarrhea denies any leg swelling.  Patient was recently hospitalized.  He states he actually feels pretty good and this was mostly because he was try to get out of the place he is staying.  Patient is his own legal guardian, he does not have a guardian listed.  He would like to be discharged at this time.  He states that his legs are chronically mildly swollen and he was worried he was gaining weight but now he feels that he is not.  He states that he stopped his Lasix when he was discharged.  He would like to go back on Lasix.  This was stopped because of acute renal injury.     Past Medical History:  Diagnosis Date  . Anemia   . Anxiety   . Arthritis   . CHF (congestive heart failure) ([place])   . Chronic kidney disease    Renal Insufficiency Syndrome; Glomerulosclerosis  2013  . Complication of anesthesia   . Depressed   . Diabetes mellitus without complication ([place])   . High cholesterol   . HIV (human immunodeficiency virus infection) ([place])   . Hypertension   . 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 Paranoid disorder ([place])   . Schizophrenia, paranoid ([place])   . Sleep apnea     Patient Active Problem List   Diagnosis Date Noted  . AKI (acute kidney injury) ([place]) 03/23/2018  . Pericarditis with effusion 03/23/2018  . Schizoaffective disorder, depressive type ([place]) 11/04/2017  . HIV positive ([place]) 11/04/2017  . Orthostatic dizziness 07/31/2017  . Chronic diastolic heart failure ([place]) 07/29/2017  . HTN (hypertension) 07/29/2017  . Diabetes ([place]) 07/29/2017  . Schizophrenia ([place]) 07/29/2017    Past Surgical History:  Procedure Laterality Date  . COLONOSCOPY WITH PROPOFOL N/A 09/03/2015   Procedure: COLONOSCOPY WITH PROPOFOL;  Surgeon: Lollie Sails, MD;  Location[facility] ENDOSCOPY;  Service: Endoscopy;  Laterality: N/A;  . DG TEETH FULL    . ESOPHAGOGASTRODUODENOSCOPY (EGD) WITH PROPOFOL N/A 07/01/2016   Procedure: ESOPHAGOGASTRODUODENOSCOPY (EGD) WITH PROPOFOL;  Surgeon: Lollie Sails, MD;  Location: [facility] ENDOSCOPY;  Service: Endoscopy;  Laterality: N/A;    Prior to Admission medications   Medication Sig Start Date End Date Taking? Authorizing Provider  Abacavir-Dolutegravir-Lamivud (TRIUMEQ) 600-50-300 MG TABS Take 1 tablet by mouth daily.  [provider]  amiodarone (PACERONE) 200 MG tablet Two tabs po twice a day for two days then one tablet twice a day afterwards 03/27/18   Loletha Grayer, MD  aspirin 325 MG tablet Take 325 mg by mouth daily.    [provider]  butalbital-acetaminophen-caffeine (FIORICET, ESGIC) 50-325-40 MG tablet Take 1-2 tablets by mouth every 6 (six) hours as needed. 01/31/18 01/31/19  Earleen Newport, MD  clonazePAM (KLONOPIN) 1 MG tablet Take 0.5-1 mg by mouth 3 (three) times daily. Take 1 mg in the  morning, 0.5 mg mid-day and 1 mg in the evening.    [provider]  cyanocobalamin 1000 MCG tablet Take 1,000 mcg by mouth daily.    [provider]  diphenhydrAMINE (BENADRYL) 50 MG capsule Take 1 capsule (50 mg total) by mouth at bedtime as needed for sleep. 11/09/17   McNew, Tyson Babinski, MD  divalproex (DEPAKOTE ER) 250 MG 24 hr tablet Take 500 mg by mouth 2 (two) times daily.     [provider]  enalapril (VASOTEC) 2.5 MG tablet Take 1 tablet (2.5 mg total) by mouth daily. 03/28/18   Wieting, Richard, MD  FANAPT 12 MG TABS Take 12 mg by mouth 2 (two) times daily.  11/14/16   [provider]  insulin degludec (TRESIBA FLEXTOUCH) 100 UNIT/ML SOPN FlexTouch Pen Inject 0.2 mLs (20 Units total) into the skin daily. 03/27/18   Loletha Grayer, MD  liraglutide (VICTOZA) 18 MG/3ML SOPN Inject 1.8 mg into the skin daily.    [provider]  metFORMIN (GLUCOPHAGE) 1000 MG tablet Take 1,000 mg by mouth 2 (two) times daily with a meal.    [provider]  Nutritional Supplements (FEEDING SUPPLEMENT, NEPRO CARB STEADY,) LIQD Take 237 mLs by mouth 2 (two) times daily between meals. 03/27/18   Loletha Grayer, MD  omeprazole (PRILOSEC) 40 MG capsule Take 40 mg by mouth 2 (two) times daily.    [provider]  promethazine (PHENERGAN) 6.25 MG/5ML syrup Take 6.25 mg by mouth every 6 (six) hours as needed for nausea or vomiting.    [provider]  rosuvastatin (CRESTOR) 20 MG tablet Take 20 mg by mouth daily.    [provider]  triamcinolone cream (KENALOG) 0.1 % Apply 1 application topically 2 (two) times daily as needed. 11/16/16   Little, Traci M, PA-C  venlafaxine XR (EFFEXOR-XR) 75 MG 24 hr capsule Take 1 capsule (75 mg total) by mouth daily with breakfast. 11/09/17   McNew, Tyson Babinski, MD    Allergies Trazodone and nefazodone; Viread [tenofovir disoproxil]; and Etodolac  Family History  Problem Relation Age of Onset  . Heart  attack Mother   . Diabetes Mellitus II Mother   . Mental illness Mother   . CAD Mother   . Heart attack Father   . CAD Father   . Hypertension Father     Social History Social History   Tobacco Use  . Smoking status: Never Smoker  . Smokeless tobacco: Never Used  Substance Use Topics  . Alcohol use: No  . Drug use: No    Review of Systems Constitutional: No fever/chills Eyes: No visual changes. ENT: No sore throat. No stiff neck no neck pain Cardiovascular: Denies chest pain. Respiratory: Denies shortness of breath. Gastrointestinal:   no vomiting.  No diarrhea.  No constipation. Genitourinary: Negative for dysuria. Musculoskeletal: Negative lower extremity swelling Skin: Negative for rash. Neurological: Negative for severe headaches, focal weakness or numbness.   ____________________________________________  PHYSICAL EXAM:  VITAL SIGNS: ED Triage Vitals  Enc Vitals Group     BP 03/29/18 1052 127/64     Pulse Rate 03/29/18 1052 67     Resp 03/29/18 1052 20     Temp 03/29/18 1052 98.4 F (36.9 C)     Temp Source 03/29/18 1052 Oral     SpO2 03/29/18 1052 96 %     Weight 03/29/18 1053 222 lb (100.7 kg)     Height 03/29/18 1053 5\' 7"  (1.702 m)     Head Circumference --      Peak Flow --      Pain Score 03/29/18 1100 0     Pain Loc --      Pain Edu? --      Excl. in [place]? --     Constitutional: Alert and oriented. Well appearing and in no acute distress. Eyes: Conjunctivae are normal Head: Atraumatic HEENT: No congestion/rhinnorhea. Mucous membranes are moist.  Oropharynx non-erythematous Neck:   Nontender with no meningismus, no masses, no stridor Cardiovascular: Normal rate, regular rhythm. Grossly normal heart sounds.  Good peripheral circulation. Respiratory: Normal respiratory effort.  No retractions. Lungs CTAB. Abdominal: Soft and nontender. No distention. No guarding no rebound Back:  There is no focal tenderness or step off.  there is no midline  tenderness there are no lesions noted. there is no CVA tenderness Musculoskeletal: No lower extremity tenderness, no upper extremity tenderness. No joint effusions, no DVT signs strong distal pulses no edema Neurologic:  Normal speech and language. No gross focal neurologic deficits are appreciated.  Skin:  Skin is warm, dry and intact. No rash noted. Psychiatric: Mood and affect are normal. Speech and behavior are normal.  ____________________________________________   LABS (all labs ordered are listed, but only abnormal results are displayed)  Labs Reviewed  BASIC METABOLIC PANEL - Abnormal; Notable for the following components:      Result Value   Glucose, Bld 161 (*)    BUN 24 (*)    All other components within normal limits  CBC - Abnormal; Notable for the following components:   RBC 3.11 (*)    Hemoglobin 10.1 (*)    HCT 28.9 (*)    RDW 14.9 (*)    All other components within normal limits  BRAIN NATRIURETIC PEPTIDE - Abnormal; Notable for the following components:   B Natriuretic Peptide 235.0 (*)    All other components within normal limits  TROPONIN I    Pertinent labs  results that were available during my care of the patient were reviewed by me and considered in my medical decision making (see chart for details). ____________________________________________  EKG  I personally interpreted any EKGs ordered by me or triage  Initial EKG showed normal sinus rhythm, rate of 71 bpm, there is baseline artifact which limits interpretation in the anterior leads, repeat EKG shows sinus rhythm rate 61 no acute ST elevation or depression that artifact is now gone, no evidence of ischemia  ____________________________________________  RADIOLOGY  Pertinent labs & imaging results that were available during my care of the patient were reviewed by me and considered in my medical decision making (see chart for details). If possible, patient and/or family made aware of any abnormal  findings.  Dg Chest 2 View  Result Date: 03/29/2018 CLINICAL DATA:  Shortness of breath with weight gain over the last 2 days. History of diabetes, congestive heart failure and HIV infection. EXAM: CHEST - 2 VIEW COMPARISON:  03/26/2018 and 03/25/2018  radiographs. FINDINGS: The left IJ central venous catheter has been removed. There is stable mild cardiomegaly. Left basilar pulmonary opacity and a small left pleural effusion have gradually progressed over the last week, although are not significantly changed over the last 3 days. The right lung is clear. There is no pneumothorax or edema. IMPRESSION: Stable small left pleural effusion and left basilar opacity over the last 3 days, likely atelectasis. No edema or progressive airspace disease. Electronically Signed   By: Richardean Sale M.D.   On: 03/29/2018 12:04   ____________________________________________    PROCEDURES  Procedure(s) performed: None  Procedures  Critical Care performed: None  ____________________________________________   INITIAL IMPRESSION / ASSESSMENT AND PLAN / ED COURSE  Pertinent labs & imaging results that were available during my care of the patient were reviewed by me and considered in my medical decision making (see chart for details).  Patient's BNP is slightly elevated, we do not know his baseline no other stigmata of significant CHF is noted.  I will give a single dose of Lasix here but we will continue to hold that pending his follow-up as an outpatient given recent renal failure.  He has no chest pain and he denies shortness of breath to me at this time.  He states it was also only he said just so he could be seen because he does not like his group home but is not made better arrangements for himself.  I will fill out an FL to form to see if we can get him to where he wants to go.  He declines further work-up or admission.  We will have him follow closely with primary care and cardiology.  Patient states that  he wants to go home as soon as his ride gets here and he has no other complaints.  He states that he was only anxious and that is the only reason he is really nothing to suggest PE ACS or dissection patient refuses further evaluation and he states he is ready to go.  He has a capacity to understand this and clearly states that he was exaggerating his symptoms to get to where he wanted to be    ____________________________________________   FINAL CLINICAL IMPRESSION(S) / ED DIAGNOSES  Final diagnoses:  None      This chart was dictated using voice recognition software.  Despite best efforts to proofread,  errors can occur which can change meaning.      Schuyler Amor, MD 03/29/18 [phone]

## 2018-03-29 NOTE — NC FL2 (Signed)
[place] LEVEL OF CARE SCREENING TOOL     IDENTIFICATION  Patient Name: Danny Cook. Birthdate: [birth year] Sex: male Admission Date (Current Location): 03/29/2018  [place] and [place] Number:  Danny Cook ([id number] [facility]) Facility and Address:  [facility], [address] [place],  [address]      Provider Number: [id number]  Attending Physician Name and Address:  Schuyler Amor, MD  Relative Name and Phone Number:       Current Level of Care: Hospital Recommended Level of Care: [facility] Prior Approval Number:    Date Approved/Denied:   PASRR Number: [id number])  Discharge Plan: Domiciliary (Rest home)    Current Diagnoses: Patient Active Problem List   Diagnosis Date Noted  . AKI (acute kidney injury) ([place]) 03/23/2018  . Pericarditis with effusion 03/23/2018  . Schizoaffective disorder, depressive type ([place]) 11/04/2017  . HIV positive ([place]) 11/04/2017  . Orthostatic dizziness 07/31/2017  . Chronic diastolic heart failure ([place]) 07/29/2017  . HTN (hypertension) 07/29/2017  . Diabetes ([place]) 07/29/2017  . Schizophrenia ([place]) 07/29/2017    Orientation RESPIRATION BLADDER Height & Weight     Self, Time, Situation, Place  Normal Continent Weight: 222 lb (100.7 kg) Height:  5\' 7"  (170.2 cm)  BEHAVIORAL SYMPTOMS/MOOD NEUROLOGICAL BOWEL NUTRITION STATUS      Continent Diet(Regular Diet )  AMBULATORY STATUS COMMUNICATION OF NEEDS Skin   Supervision Verbally Normal                       Personal Care Assistance Level of Assistance  Bathing, Feeding, Dressing Bathing Assistance: Independent Feeding assistance: Independent Dressing Assistance: Independent     Functional Limitations Info  Sight, Hearing, Speech Sight Info: Adequate Hearing Info: Adequate Speech Info: Adequate    SPECIAL CARE FACTORS FREQUENCY  PT (By licensed PT)     PT Frequency: (2-3 home health )               Contractures      Additional Factors Info  Code Status, Allergies Code Status Info: (Full Code. ) Allergies Info: (Trazodone And Nefazodone, Viread Tenofovir Disoproxil, Etodolac)           Current Medications (03/29/2018):  This is the current hospital active medication list No current facility-administered medications for this encounter.    Current Outpatient Medications  Medication Sig Dispense Refill  . Abacavir-Dolutegravir-Lamivud (TRIUMEQ) 600-50-300 MG TABS Take 1 tablet by mouth daily.    [name] amiodarone (PACERONE) 200 MG tablet Two tabs po twice a day for two days then one tablet twice a day afterwards 66 tablet 0  . aspirin 325 MG tablet Take 325 mg by mouth daily.    . butalbital-acetaminophen-caffeine (FIORICET, ESGIC) 50-325-40 MG tablet Take 1-2 tablets by mouth every 6 (six) hours as needed. 20 tablet 0  . clonazePAM (KLONOPIN) 1 MG tablet Take 0.5-1 mg by mouth 3 (three) times daily. Take 1 mg in the morning, 0.5 mg mid-day and 1 mg in the evening.    . cyanocobalamin 1000 MCG tablet Take 1,000 mcg by mouth daily.    . diphenhydrAMINE (BENADRYL) 50 MG capsule Take 1 capsule (50 mg total) by mouth at bedtime as needed for sleep. 30 capsule 0  . divalproex (DEPAKOTE ER) 250 MG 24 hr tablet Take 500 mg by mouth 2 (two) times daily.     . enalapril (VASOTEC) 2.5 MG tablet Take 1 tablet (2.5 mg total) by mouth daily. 30 tablet 0  .  FANAPT 12 MG TABS Take 12 mg by mouth 2 (two) times daily.     . insulin degludec (TRESIBA FLEXTOUCH) 100 UNIT/ML SOPN FlexTouch Pen Inject 0.2 mLs (20 Units total) into the skin daily.    [name] liraglutide (VICTOZA) 18 MG/3ML SOPN Inject 1.8 mg into the skin daily.    . metFORMIN (GLUCOPHAGE) 1000 MG tablet Take 1,000 mg by mouth 2 (two) times daily with a meal.    . Nutritional Supplements (FEEDING SUPPLEMENT, NEPRO CARB STEADY,) LIQD Take 237 mLs by mouth 2 (two) times daily between meals. 60 Can 0  . omeprazole (PRILOSEC) 40 MG capsule Take 40  mg by mouth 2 (two) times daily.    . promethazine (PHENERGAN) 6.25 MG/5ML syrup Take 6.25 mg by mouth every 6 (six) hours as needed for nausea or vomiting.    . rosuvastatin (CRESTOR) 20 MG tablet Take 20 mg by mouth daily.    [name] triamcinolone cream (KENALOG) 0.1 % Apply 1 application topically 2 (two) times daily as needed. 30 g 0  . venlafaxine XR (EFFEXOR-XR) 75 MG 24 hr capsule Take 1 capsule (75 mg total) by mouth daily with breakfast. 30 capsule 0     Discharge Medications: Please see discharge summary for a list of discharge medications.  Relevant Imaging Results:  Relevant Lab Results:   Additional Information (SSN: [id number])  Danny Cook, Danny Beets, LCSW

## 2018-03-29 NOTE — ED Notes (Addendum)
Pt aware that Lavada Mesi will be coming to pick pt up and take pt to her group home between 1600-1630. Pt states he is alright to wait in [place] for [facility] to pick pt up. At time of dc pt alert & oriented x4. ABCs intact. NAD.

## 2018-03-29 NOTE — ED Notes (Signed)
MD at bedside. 

## 2018-03-29 NOTE — Progress Notes (Signed)
Clinical Education officer, museum ([name]) received a call from [employer] stating that she can take patient into her [place] group home today and she will come pick him up today between 4 pm and 4:30 pm. Danny Cook requested an FL2. FL2 complete. RN and MD aware of above.   [employer], LCSW [phone]

## 2018-03-29 NOTE — ED Triage Notes (Addendum)
Pt comes into the ED via EMS from home with c/o slight SOB with a 5lb wt gain since Saturday. Pt has multiple medical issues. Pt is in NAD on arrival. Respirations WNL. Skin Is warm and dry.pt had a recent admission for pericarditis, states upon discharge he was instructed to stop taking his lasix.

## 2018-04-12 ENCOUNTER — Emergency Department: Payer: Medicare Other

## 2018-04-12 ENCOUNTER — Other Ambulatory Visit: Payer: Self-pay

## 2018-04-12 ENCOUNTER — Inpatient Hospital Stay
Admission: EM | Admit: 2018-04-12 | Discharge: 2018-04-14 | DRG: 975 | Disposition: A | Discharge status: Home / Self Care | Payer: Medicare Other | Attending: Internal Medicine | Admitting: Internal Medicine

## 2018-04-12 DIAGNOSIS — R509 Fever, unspecified: Secondary | ICD-10-CM | POA: Diagnosis not present

## 2018-04-12 DIAGNOSIS — F419 Anxiety disorder, unspecified: Secondary | ICD-10-CM | POA: Diagnosis present

## 2018-04-12 DIAGNOSIS — F200 Paranoid schizophrenia: Secondary | ICD-10-CM | POA: Diagnosis present

## 2018-04-12 DIAGNOSIS — M1990 Unspecified osteoarthritis, unspecified site: Secondary | ICD-10-CM | POA: Diagnosis present

## 2018-04-12 DIAGNOSIS — Z8249 Family history of ischemic heart disease and other diseases of the circulatory system: Secondary | ICD-10-CM

## 2018-04-12 DIAGNOSIS — Z818 Family history of other mental and behavioral disorders: Secondary | ICD-10-CM

## 2018-04-12 DIAGNOSIS — I509 Heart failure, unspecified: Secondary | ICD-10-CM | POA: Diagnosis present

## 2018-04-12 DIAGNOSIS — N269 Renal sclerosis, unspecified: Secondary | ICD-10-CM | POA: Diagnosis present

## 2018-04-12 DIAGNOSIS — G4730 Sleep apnea, unspecified: Secondary | ICD-10-CM | POA: Diagnosis present

## 2018-04-12 DIAGNOSIS — I313 Pericardial effusion (noninflammatory): Secondary | ICD-10-CM | POA: Diagnosis present

## 2018-04-12 DIAGNOSIS — Z888 Allergy status to other drugs, medicaments and biological substances status: Secondary | ICD-10-CM | POA: Diagnosis not present

## 2018-04-12 DIAGNOSIS — Z8589 Personal history of malignant neoplasm of other organs and systems: Secondary | ICD-10-CM | POA: Diagnosis not present

## 2018-04-12 DIAGNOSIS — N183 Chronic kidney disease, stage 3 (moderate): Secondary | ICD-10-CM | POA: Diagnosis present

## 2018-04-12 DIAGNOSIS — J9811 Atelectasis: Secondary | ICD-10-CM | POA: Diagnosis present

## 2018-04-12 DIAGNOSIS — K219 Gastro-esophageal reflux disease without esophagitis: Secondary | ICD-10-CM | POA: Diagnosis present

## 2018-04-12 DIAGNOSIS — B20 Human immunodeficiency virus [HIV] disease: Secondary | ICD-10-CM | POA: Diagnosis present

## 2018-04-12 DIAGNOSIS — Z79899 Other long term (current) drug therapy: Secondary | ICD-10-CM

## 2018-04-12 DIAGNOSIS — I130 Hypertensive heart and chronic kidney disease with heart failure and stage 1 through stage 4 chronic kidney disease, or unspecified chronic kidney disease: Secondary | ICD-10-CM | POA: Diagnosis present

## 2018-04-12 DIAGNOSIS — E1122 Type 2 diabetes mellitus with diabetic chronic kidney disease: Secondary | ICD-10-CM | POA: Diagnosis present

## 2018-04-12 DIAGNOSIS — I480 Paroxysmal atrial fibrillation: Secondary | ICD-10-CM | POA: Diagnosis present

## 2018-04-12 DIAGNOSIS — A419 Sepsis, unspecified organism: Secondary | ICD-10-CM

## 2018-04-12 DIAGNOSIS — D631 Anemia in chronic kidney disease: Secondary | ICD-10-CM | POA: Diagnosis present

## 2018-04-12 DIAGNOSIS — I712 Thoracic aortic aneurysm, without rupture: Secondary | ICD-10-CM | POA: Diagnosis present

## 2018-04-12 DIAGNOSIS — J90 Pleural effusion, not elsewhere classified: Secondary | ICD-10-CM | POA: Diagnosis not present

## 2018-04-12 DIAGNOSIS — Z833 Family history of diabetes mellitus: Secondary | ICD-10-CM

## 2018-04-12 DIAGNOSIS — Z7982 Long term (current) use of aspirin: Secondary | ICD-10-CM

## 2018-04-12 DIAGNOSIS — Y95 Nosocomial condition: Secondary | ICD-10-CM | POA: Diagnosis present

## 2018-04-12 DIAGNOSIS — N179 Acute kidney failure, unspecified: Secondary | ICD-10-CM | POA: Diagnosis not present

## 2018-04-12 DIAGNOSIS — E785 Hyperlipidemia, unspecified: Secondary | ICD-10-CM | POA: Diagnosis present

## 2018-04-12 DIAGNOSIS — J189 Pneumonia, unspecified organism: Principal | ICD-10-CM | POA: Diagnosis present

## 2018-04-12 DIAGNOSIS — N189 Chronic kidney disease, unspecified: Secondary | ICD-10-CM | POA: Diagnosis not present

## 2018-04-12 DIAGNOSIS — D649 Anemia, unspecified: Secondary | ICD-10-CM | POA: Diagnosis not present

## 2018-04-12 DIAGNOSIS — D3500 Benign neoplasm of unspecified adrenal gland: Secondary | ICD-10-CM | POA: Diagnosis present

## 2018-04-12 DIAGNOSIS — R0602 Shortness of breath: Secondary | ICD-10-CM | POA: Diagnosis not present

## 2018-04-12 DIAGNOSIS — R51 Headache: Secondary | ICD-10-CM | POA: Diagnosis not present

## 2018-04-12 DIAGNOSIS — R05 Cough: Secondary | ICD-10-CM | POA: Diagnosis not present

## 2018-04-12 LAB — GLUCOSE, CAPILLARY
GLUCOSE-CAPILLARY: 169 mg/dL — AB (ref 70–99)
Glucose-Capillary: 114 mg/dL — ABNORMAL HIGH (ref 70–99)
Glucose-Capillary: 94 mg/dL (ref 70–99)

## 2018-04-12 LAB — URINALYSIS, ROUTINE W REFLEX MICROSCOPIC
BILIRUBIN URINE: NEGATIVE
Bacteria, UA: NONE SEEN
GLUCOSE, UA: NEGATIVE mg/dL
Ketones, ur: 5 mg/dL — AB
NITRITE: NEGATIVE
PH: 5 (ref 5.0–8.0)
Protein, ur: 100 mg/dL — AB
SPECIFIC GRAVITY, URINE: 1.023 (ref 1.005–1.030)

## 2018-04-12 LAB — CBC WITH DIFFERENTIAL/PLATELET
Abs Immature Granulocytes: 0.04 10*3/uL (ref 0.00–0.07)
BASOS ABS: 0 10*3/uL (ref 0.0–0.1)
BASOS PCT: 0 %
EOS PCT: 0 %
Eosinophils Absolute: 0 10*3/uL (ref 0.0–0.5)
HCT: 28.3 % — ABNORMAL LOW (ref 39.0–52.0)
HEMOGLOBIN: 9.5 g/dL — AB (ref 13.0–17.0)
Immature Granulocytes: 0 %
LYMPHS PCT: 6 %
Lymphs Abs: 0.5 10*3/uL — ABNORMAL LOW (ref 0.7–4.0)
MCH: 30.3 pg (ref 26.0–34.0)
MCHC: 33.6 g/dL (ref 30.0–36.0)
MCV: 90.1 fL (ref 80.0–100.0)
Monocytes Absolute: 0.9 10*3/uL (ref 0.1–1.0)
Monocytes Relative: 9 %
NRBC: 0 % (ref 0.0–0.2)
Neutro Abs: 8.2 10*3/uL — ABNORMAL HIGH (ref 1.7–7.7)
Neutrophils Relative %: 85 %
PLATELETS: 208 10*3/uL (ref 150–400)
RBC: 3.14 MIL/uL — AB (ref 4.22–5.81)
RDW: 13.8 % (ref 11.5–15.5)
WBC: 9.7 10*3/uL (ref 4.0–10.5)

## 2018-04-12 LAB — COMPREHENSIVE METABOLIC PANEL
ALT: 27 U/L (ref 0–44)
AST: 28 U/L (ref 15–41)
Albumin: 2.8 g/dL — ABNORMAL LOW (ref 3.5–5.0)
Alkaline Phosphatase: 80 U/L (ref 38–126)
Anion gap: 14 (ref 5–15)
BILIRUBIN TOTAL: 0.6 mg/dL (ref 0.3–1.2)
BUN: 21 mg/dL — ABNORMAL HIGH (ref 6–20)
CHLORIDE: 99 mmol/L (ref 98–111)
CO2: 22 mmol/L (ref 22–32)
CREATININE: 1.55 mg/dL — AB (ref 0.61–1.24)
Calcium: 8.3 mg/dL — ABNORMAL LOW (ref 8.9–10.3)
GFR calc Af Amer: 55 mL/min — ABNORMAL LOW (ref >60)
GFR, EST NON AFRICAN AMERICAN: 47 mL/min — AB (ref >60)
Glucose, Bld: 196 mg/dL — ABNORMAL HIGH (ref 70–99)
Potassium: 3.9 mmol/L (ref 3.5–5.1)
SODIUM: 135 mmol/L (ref 135–145)
TOTAL PROTEIN: 7.1 g/dL (ref 6.5–8.1)

## 2018-04-12 LAB — TROPONIN I: Troponin I: <0.03 ng/mL (ref <0.03)

## 2018-04-12 LAB — LACTIC ACID, PLASMA: Lactic Acid, Venous: 1.4 mmol/L (ref 0.5–1.9)

## 2018-04-12 LAB — INFLUENZA PANEL BY PCR (TYPE A & B)
Influenza A By PCR: NEGATIVE
Influenza B By PCR: NEGATIVE

## 2018-04-12 MED ORDER — CLONAZEPAM 0.5 MG PO TABS
1.0000 mg | ORAL_TABLET | Freq: Two times a day (BID) | ORAL | Status: DC
  Administered 2018-04-13 – 2018-04-14 (×3): 1 mg via ORAL
  Filled 2018-04-12 (×3): qty 2

## 2018-04-12 MED ORDER — GUAIFENESIN-CODEINE 100-10 MG/5ML PO SOLN
5.0000 mL | Freq: Four times a day (QID) | ORAL | Status: DC | PRN
  Administered 2018-04-12 (×2): 5 mL via ORAL
  Filled 2018-04-12 (×2): qty 5

## 2018-04-12 MED ORDER — VENLAFAXINE HCL ER 75 MG PO CP24
75.0000 mg | ORAL_CAPSULE | Freq: Every day | ORAL | Status: DC
  Administered 2018-04-13 – 2018-04-14 (×2): 75 mg via ORAL
  Filled 2018-04-12 (×2): qty 1

## 2018-04-12 MED ORDER — METFORMIN HCL 500 MG PO TABS
1000.0000 mg | ORAL_TABLET | Freq: Two times a day (BID) | ORAL | Status: DC
  Administered 2018-04-12 – 2018-04-14 (×4): 1000 mg via ORAL
  Filled 2018-04-12 (×5): qty 2

## 2018-04-12 MED ORDER — ILOPERIDONE 4 MG PO TABS
12.0000 mg | ORAL_TABLET | Freq: Two times a day (BID) | ORAL | Status: DC
  Administered 2018-04-12 – 2018-04-14 (×4): 12 mg via ORAL
  Filled 2018-04-12 (×7): qty 3

## 2018-04-12 MED ORDER — INSULIN DEGLUDEC 100 UNIT/ML ~~LOC~~ SOPN: 20.0000 [IU] | PEN_INJECTOR | Freq: Every day | SUBCUTANEOUS | Status: DC

## 2018-04-12 MED ORDER — PANTOPRAZOLE SODIUM 40 MG PO TBEC
40.0000 mg | DELAYED_RELEASE_TABLET | Freq: Every day | ORAL | Status: DC
  Administered 2018-04-13 – 2018-04-14 (×2): 40 mg via ORAL
  Filled 2018-04-12 (×2): qty 1

## 2018-04-12 MED ORDER — SODIUM CHLORIDE 0.9 % IV SOLN
500.0000 mg | INTRAVENOUS | Status: DC
  Administered 2018-04-12: 500 mg via INTRAVENOUS
  Filled 2018-04-12 (×2): qty 500

## 2018-04-12 MED ORDER — SODIUM CHLORIDE 0.9 % IV SOLN
2.0000 g | Freq: Two times a day (BID) | INTRAVENOUS | Status: DC
  Administered 2018-04-12 – 2018-04-14 (×4): 2 g via INTRAVENOUS
  Filled 2018-04-12 (×6): qty 2

## 2018-04-12 MED ORDER — DIVALPROEX SODIUM ER 500 MG PO TB24
500.0000 mg | ORAL_TABLET | Freq: Two times a day (BID) | ORAL | Status: DC
  Administered 2018-04-12 – 2018-04-14 (×4): 500 mg via ORAL
  Filled 2018-04-12 (×5): qty 1

## 2018-04-12 MED ORDER — DIPHENHYDRAMINE HCL 25 MG PO CAPS
50.0000 mg | ORAL_CAPSULE | Freq: Every evening | ORAL | Status: DC | PRN
  Administered 2018-04-13: 21:00:00 50 mg via ORAL
  Filled 2018-04-12: qty 2

## 2018-04-12 MED ORDER — INSULIN ASPART 100 UNIT/ML ~~LOC~~ SOLN
0.0000 [IU] | Freq: Three times a day (TID) | SUBCUTANEOUS | Status: DC
  Administered 2018-04-13: 18:00:00 2 [IU] via SUBCUTANEOUS
  Administered 2018-04-14: 1 [IU] via SUBCUTANEOUS
  Filled 2018-04-12 (×2): qty 1

## 2018-04-12 MED ORDER — CLONAZEPAM 0.5 MG PO TABS: 0.5000 mg | ORAL_TABLET | Freq: Every day | ORAL | Status: DC

## 2018-04-12 MED ORDER — ASPIRIN EC 325 MG PO TBEC
325.0000 mg | DELAYED_RELEASE_TABLET | Freq: Every day | ORAL | Status: DC
  Administered 2018-04-13 – 2018-04-14 (×2): 325 mg via ORAL
  Filled 2018-04-12 (×2): qty 1

## 2018-04-12 MED ORDER — ENALAPRIL MALEATE 2.5 MG PO TABS
2.5000 mg | ORAL_TABLET | Freq: Every day | ORAL | Status: DC
  Administered 2018-04-13 – 2018-04-14 (×2): 2.5 mg via ORAL
  Filled 2018-04-12 (×3): qty 1

## 2018-04-12 MED ORDER — SODIUM CHLORIDE 0.9 % IV SOLN
2.0000 g | Freq: Once | INTRAVENOUS | Status: AC
  Administered 2018-04-12: 2 g via INTRAVENOUS
  Filled 2018-04-12: qty 2

## 2018-04-12 MED ORDER — AMIODARONE HCL 200 MG PO TABS
200.0000 mg | ORAL_TABLET | Freq: Two times a day (BID) | ORAL | Status: DC
  Administered 2018-04-12 – 2018-04-13 (×3): 200 mg via ORAL
  Filled 2018-04-12 (×4): qty 1

## 2018-04-12 MED ORDER — VANCOMYCIN HCL IN DEXTROSE 1-5 GM/200ML-% IV SOLN
1000.0000 mg | Freq: Once | INTRAVENOUS | Status: AC
  Administered 2018-04-12: 1000 mg via INTRAVENOUS
  Filled 2018-04-12: qty 200

## 2018-04-12 MED ORDER — VANCOMYCIN HCL IN DEXTROSE 1-5 GM/200ML-% IV SOLN
1000.0000 mg | INTRAVENOUS | Status: DC
  Administered 2018-04-12: 1000 mg via INTRAVENOUS
  Filled 2018-04-12 (×2): qty 200

## 2018-04-12 MED ORDER — TRIAMCINOLONE ACETONIDE 0.1 % EX CREA
1.0000 "application " | TOPICAL_CREAM | Freq: Two times a day (BID) | CUTANEOUS | Status: DC | PRN
  Filled 2018-04-12: qty 15

## 2018-04-12 MED ORDER — PROMETHAZINE HCL 6.25 MG/5ML PO SYRP
6.2500 mg | ORAL_SOLUTION | Freq: Four times a day (QID) | ORAL | Status: DC | PRN
  Filled 2018-04-12: qty 5

## 2018-04-12 MED ORDER — INSULIN GLARGINE 100 UNIT/ML ~~LOC~~ SOLN
20.0000 [IU] | Freq: Every day | SUBCUTANEOUS | Status: DC
  Filled 2018-04-12: qty 0.2

## 2018-04-12 MED ORDER — HEPARIN SODIUM (PORCINE) 5000 UNIT/ML IJ SOLN
5000.0000 [IU] | Freq: Three times a day (TID) | INTRAMUSCULAR | Status: DC
  Administered 2018-04-12 – 2018-04-14 (×5): 5000 [IU] via SUBCUTANEOUS
  Filled 2018-04-12 (×5): qty 1

## 2018-04-12 MED ORDER — CLONAZEPAM 0.5 MG PO TABS
0.5000 mg | ORAL_TABLET | Freq: Two times a day (BID) | ORAL | Status: DC | PRN
  Administered 2018-04-12: 0.5 mg via ORAL
  Filled 2018-04-12: qty 1

## 2018-04-12 MED ORDER — DOCUSATE SODIUM 100 MG PO CAPS: 100.0000 mg | ORAL_CAPSULE | Freq: Two times a day (BID) | ORAL | Status: DC | PRN

## 2018-04-12 MED ORDER — VITAMIN B-12 1000 MCG PO TABS
1000.0000 ug | ORAL_TABLET | Freq: Every day | ORAL | Status: DC
  Administered 2018-04-13 – 2018-04-14 (×2): 1000 ug via ORAL
  Filled 2018-04-12 (×2): qty 1

## 2018-04-12 MED ORDER — BUTALBITAL-APAP-CAFFEINE 50-325-40 MG PO TABS
1.0000 | ORAL_TABLET | Freq: Four times a day (QID) | ORAL | Status: DC | PRN
  Administered 2018-04-13: 07:00:00 2 via ORAL
  Filled 2018-04-12 (×2): qty 2

## 2018-04-12 MED ORDER — ROSUVASTATIN CALCIUM 20 MG PO TABS
20.0000 mg | ORAL_TABLET | Freq: Every day | ORAL | Status: DC
  Administered 2018-04-13 – 2018-04-14 (×2): 20 mg via ORAL
  Filled 2018-04-12 (×2): qty 1

## 2018-04-12 MED ORDER — NEPRO/CARBSTEADY PO LIQD
237.0000 mL | Freq: Two times a day (BID) | ORAL | Status: DC
  Administered 2018-04-13 – 2018-04-14 (×3): 237 mL via ORAL

## 2018-04-12 MED ORDER — ABACAVIR-DOLUTEGRAVIR-LAMIVUD 600-50-300 MG PO TABS
1.0000 | ORAL_TABLET | Freq: Every day | ORAL | Status: DC
  Administered 2018-04-13 – 2018-04-14 (×2): 1 via ORAL
  Filled 2018-04-12 (×2): qty 1

## 2018-04-12 NOTE — Progress Notes (Signed)
Pharmacy Antibiotic Note  Danny Cook. is a 57 y.o. male admitted on 04/12/2018 with pneumonia.  Pharmacy has been consulted for vancomycin and cefepime dosing. Pt received first doses in the ED.  Plan: Cefepime 2 g IV q12h   Vancomycin 1 g IV q18h with stacked dosing Trough before 4th dose - 10/16 at 2300 Will need to closely monitor renal function. SCr 1.55 on admission with SCr 1.10 on 03/29/18. Will order MRSA PCR to help guide therapy  Ke 0.044, half life 16 h, Vd 54 L Goal trough 15-20 mcg  Height: 5\' 7"  (170.2 cm) Weight: 207 lb (93.9 kg) IBW/kg (Calculated) : 66.1  Temp (24hrs), Avg:99 F (37.2 C), Min:98.3 F (36.8 C), Max:99.6 F (37.6 C)  Recent Labs  Lab 04/12/18 1101  WBC 9.7  CREATININE 1.55*  LATICACIDVEN 1.4    Estimated Creatinine Clearance: 56 mL/min (A) (by C-G formula based on SCr of 1.55 mg/dL (H)).    Allergies  Allergen Reactions  . Trazodone And Nefazodone   . Viread [Tenofovir Disoproxil]     Damages kidneys  . Etodolac Rash    Antimicrobials this admission: Cefepime/vanc 10/14>> Azithromycin 10/14>>  Dose adjustments this admission:   Microbiology results: 10/14 BCx: sent 10/14 MRSA PCR: ordered  Thank you for allowing pharmacy to be a part of this patient's care.  Rocky Morel 04/12/2018 2:26 PM

## 2018-04-12 NOTE — Progress Notes (Signed)
Inpatient Diabetes Program Recommendations  AACE/ADA: New Consensus Statement on Inpatient Glycemic Control (2019)  Target Ranges:  Prepandial:   less than 140 mg/dL      Peak postprandial:   less than 180 mg/dL (1-2 hours)      Critically ill patients:  140 - 180 mg/dL  Results for ADDAM, GOELLER (MRN [id number]) as of 04/12/2018 14:46  Ref. Range 04/12/2018 13:59  Glucose-Capillary Latest Ref Range: 70 - 99 mg/dL 169 (H)    Review of Glycemic Control  Diabetes history: DM2 Outpatient Diabetes medications: Tresiba 20 units daily (if CBG <85; if CBG > 85 uses Victoza), Victoza 1.8 mg (if CBG >85; uses [place] if CBG <85), Metformin 1000 mg BID Current orders for Inpatient glycemic control: Novolog 0-9 units TID with meals  Inpatient Diabetes Program Recommendations: Insulin - Basal: Noted order for Tresiba 20 units daily. Tresiba insulin is not on formulary. Please discontinue Tyler Aas and do not recommend orderig any basal insulin at this time. Correction (SSI): Please consider ordering Novolog 0-5 units QHS for bedtime correction scale.  Thanks, Barnie Alderman, RN, MSN, CDE Diabetes Coordinator Inpatient Diabetes Program [phone] (Team Pager from 8am to 5pm)

## 2018-04-12 NOTE — ED Notes (Signed)
Taken to floor by Netta Cedars.

## 2018-04-12 NOTE — Progress Notes (Signed)
CODE SEPSIS - PHARMACY COMMUNICATION  **Broad Spectrum Antibiotics should be administered within 1 hour of Sepsis diagnosis**  Time Code Sepsis Called/Page Received: 1056  Antibiotics Ordered: Cefepime, Vancomycin, Azithromycin   Time of 1st antibiotic administration: 1153 Cefepime  Additional action taken by pharmacy: spoke with RN   If necessary, Name of Provider/Nurse Contacted: Huel Coventry ,PharmD Clinical Pharmacist  04/12/2018  11:12 AM

## 2018-04-12 NOTE — ED Provider Notes (Signed)
[facility] Emergency Department Provider Note  ____________________________________________   First MD Initiated Contact with Patient 04/12/18 1102     (approximate)  I have reviewed the triage vital signs and the nursing notes.   HISTORY  Chief Complaint Fever and Cough   HPI Danny Cook. is a 57 y.o. male with a history of HIV taking antiretroviral therapy complaining of 4 days of shortness of breath with a dry cough.  Fever at home per EMS of 101.4.  Patient has not taken any Tylenol.  Denies body aches.  Says that his last CD4 count a month ago was approximately 800 with an undetectable viral load.   Past Medical History:  Diagnosis Date  . Anemia   . Anxiety   . Arthritis   . CHF (congestive heart failure) ([place])   . Chronic kidney disease    Renal Insufficiency Syndrome; Glomerulosclerosis 2013  . Complication of anesthesia   . Depressed   . Diabetes mellitus without complication ([place])   . High cholesterol   . HIV (human immunodeficiency virus infection) ([place])   . Hypertension   . 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 Paranoid disorder ([place])   . Schizophrenia, paranoid ([place])   . Sleep apnea     Patient Active Problem List   Diagnosis Date Noted  . AKI (acute kidney injury) ([place]) 03/23/2018  . Pericarditis with effusion 03/23/2018  . Schizoaffective disorder, depressive type ([place]) 11/04/2017  . HIV positive ([place]) 11/04/2017  . Orthostatic dizziness 07/31/2017  . Chronic diastolic heart failure ([place]) 07/29/2017  . HTN (hypertension) 07/29/2017  . Diabetes ([place]) 07/29/2017  . Schizophrenia ([place]) 07/29/2017    Past Surgical History:  Procedure Laterality Date  . COLONOSCOPY WITH PROPOFOL N/A 09/03/2015   Procedure: COLONOSCOPY WITH PROPOFOL;  Surgeon: Lollie Sails, MD;  Location: [facility] ENDOSCOPY;  Service: Endoscopy;  Laterality: N/A;  . DG TEETH FULL    . ESOPHAGOGASTRODUODENOSCOPY (EGD) WITH PROPOFOL N/A 07/01/2016   Procedure: ESOPHAGOGASTRODUODENOSCOPY (EGD) WITH PROPOFOL;  Surgeon: Lollie Sails, MD;  Location: [facility] ENDOSCOPY;  Service: Endoscopy;  Laterality: N/A;    Prior to Admission medications   Medication Sig Start Date End Date Taking? Authorizing Provider  Abacavir-Dolutegravir-Lamivud (TRIUMEQ) 600-50-300 MG TABS Take 1 tablet by mouth daily.    [provider]  amiodarone (PACERONE) 200 MG tablet Two tabs po twice a day for two days then one tablet twice a day afterwards 03/27/18   Loletha Grayer, MD  aspirin 325 MG tablet Take 325 mg by mouth daily.    [provider]  butalbital-acetaminophen-caffeine (FIORICET, ESGIC) 50-325-40 MG tablet Take 1-2 tablets by mouth every 6 (six) hours as needed. 01/31/18 01/31/19  Earleen Newport, MD  clonazePAM (KLONOPIN) 1 MG tablet Take 0.5-1 mg by mouth 3 (three) times daily. Take 1 mg in the morning, 0.5 mg mid-day and 1 mg in the evening.    [provider]  cyanocobalamin 1000 MCG tablet Take 1,000 mcg by mouth daily.    [provider]  diphenhydrAMINE (BENADRYL) 50 MG capsule Take 1 capsule (50 mg total) by mouth at bedtime as needed for sleep. 11/09/17   McNew, Tyson Babinski, MD  divalproex (DEPAKOTE ER) 250 MG 24 hr tablet Take 500 mg by mouth 2 (two) times daily.     [provider]  enalapril (VASOTEC) 2.5 MG tablet Take 1 tablet (2.5 mg total) by mouth daily. 03/28/18   Loletha Grayer, MD  FANAPT 12 MG TABS Take 12  mg by mouth 2 (two) times daily.  11/14/16   [provider]  insulin degludec (TRESIBA FLEXTOUCH) 100 UNIT/ML SOPN FlexTouch Pen Inject 0.2 mLs (20 Units total) into the skin daily. 03/27/18   Loletha Grayer, MD  liraglutide (VICTOZA) 18 MG/3ML SOPN Inject 1.8 mg into the skin daily.    [provider]  metFORMIN (GLUCOPHAGE) 1000 MG tablet Take 1,000 mg by mouth 2 (two) times daily with a meal.    [provider]  Nutritional Supplements (FEEDING SUPPLEMENT, NEPRO  CARB STEADY,) LIQD Take 237 mLs by mouth 2 (two) times daily between meals. 03/27/18   Loletha Grayer, MD  omeprazole (PRILOSEC) 40 MG capsule Take 40 mg by mouth 2 (two) times daily.    [provider]  promethazine (PHENERGAN) 6.25 MG/5ML syrup Take 6.25 mg by mouth every 6 (six) hours as needed for nausea or vomiting.    [provider]  rosuvastatin (CRESTOR) 20 MG tablet Take 20 mg by mouth daily.    [provider]  triamcinolone cream (KENALOG) 0.1 % Apply 1 application topically 2 (two) times daily as needed. 11/16/16   Little, Traci M, PA-C  venlafaxine XR (EFFEXOR-XR) 75 MG 24 hr capsule Take 1 capsule (75 mg total) by mouth daily with breakfast. 11/09/17   McNew, Tyson Babinski, MD    Allergies Trazodone and nefazodone; Viread [tenofovir disoproxil]; and Etodolac  Family History  Problem Relation Age of Onset  . Heart attack Mother   . Diabetes Mellitus II Mother   . Mental illness Mother   . CAD Mother   . Heart attack Father   . CAD Father   . Hypertension Father     Social History Social History   Tobacco Use  . Smoking status: Never Smoker  . Smokeless tobacco: Never Used  Substance Use Topics  . Alcohol use: No  . Drug use: No    Review of Systems  Constitutional: As above Eyes: No visual changes. ENT: No sore throat. Cardiovascular: Denies chest pain. Respiratory: As above Gastrointestinal: No abdominal pain.  No nausea, no vomiting.  No diarrhea.  No constipation. Genitourinary: Negative for dysuria. Musculoskeletal: Negative for back pain. Skin: Negative for rash. Neurological: Negative for headaches, focal weakness or numbness.   ____________________________________________   PHYSICAL EXAM:  VITAL SIGNS: ED Triage Vitals  Enc Vitals Group     BP 04/12/18 1053 131/70     Pulse Rate 04/12/18 1053 97     Resp 04/12/18 1053 (!) 28     Temp 04/12/18 1053 98.3 F (36.8 C)     Temp Source 04/12/18 1053 Oral     SpO2 04/12/18  1052 98 %     Weight 04/12/18 1054 221 lb 12.5 oz (100.6 kg)     Height 04/12/18 1054 5\' 7"  (1.702 m)     Head Circumference --      Peak Flow --      Pain Score 04/12/18 1054 0     Pain Loc --      Pain Edu? --      Excl. in [place]? --     Constitutional: Alert and oriented. Well appearing and in no acute distress. Eyes: Conjunctivae are normal.  Head: Atraumatic. Nose: No congestion/rhinnorhea. Mouth/Throat: Mucous membranes are moist.  Neck: No stridor.   Cardiovascular: Normal rate, regular rhythm. Grossly normal heart sounds.  Respiratory: Tachypneic.  No retractions. Lungs CTAB. Gastrointestinal: Soft and nontender. No distention.  Musculoskeletal: No lower extremity tenderness nor edema.  No  joint effusions. Neurologic:  Normal speech and language. No gross focal neurologic deficits are appreciated. Skin:  Skin is warm, dry and intact. No rash noted. Psychiatric: Mood and affect are normal. Speech and behavior are normal.  ____________________________________________   LABS (all labs ordered are listed, but only abnormal results are displayed)  Labs Reviewed  CBC WITH DIFFERENTIAL/PLATELET - Abnormal; Notable for the following components:      Result Value   RBC 3.14 (*)    Hemoglobin 9.5 (*)    HCT 28.3 (*)    Neutro Abs 8.2 (*)    Lymphs Abs 0.5 (*)    All other components within normal limits  CULTURE, BLOOD (ROUTINE X 2)  CULTURE, BLOOD (ROUTINE X 2)  COMPREHENSIVE METABOLIC PANEL  URINALYSIS, ROUTINE W REFLEX MICROSCOPIC  LACTIC ACID, PLASMA  LACTIC ACID, PLASMA  TROPONIN I  INFLUENZA PANEL BY PCR (TYPE A & B)   ____________________________________________  EKG  ED ECG REPORT I, Doran Stabler, the attending physician, personally viewed and interpreted this ECG.   Date: 04/12/2018  EKG Time: 1056  Rate: 96  Rhythm: normal sinus rhythm  Axis: Normal  Intervals:none  ST&T Change: T wave inversions 2, 3 and aVF as well as V4 through V6.  No ST  elevation or depression. Inversions appear new from previous. ____________________________________________  RADIOLOGY  Small left-sided effusion with bilateral lower lobe atelectasis versus infiltrates.  Left greater than right. ____________________________________________   PROCEDURES  Procedure(s) performed:   .Critical Care Performed by: Orbie Pyo, MD Authorized by: Orbie Pyo, MD   Critical care provider statement:    Critical care time (minutes):  45   Critical care was necessary to treat or prevent imminent or life-threatening deterioration of the following conditions:  Sepsis   Critical care was time spent personally by me on the following activities:  Discussions with consultants, evaluation of patient's response to treatment, examination of patient, ordering and performing treatments and interventions, ordering and review of laboratory studies, ordering and review of radiographic studies, pulse oximetry, re-evaluation of patient's condition, obtaining history from patient or surrogate and review of old charts    Critical Care performed:    ____________________________________________   INITIAL IMPRESSION / ASSESSMENT AND PLAN / ED COURSE  Pertinent labs & imaging results that were available during my care of the patient were reviewed by me and considered in my medical decision making (see chart for details).  Differential includes, but is not limited to, viral syndrome, bronchitis including COPD exacerbation, pneumonia, reactive airway disease including asthma, CHF including exacerbation with or without pulmonary/interstitial edema, pneumothorax, ACS, thoracic trauma, and pulmonary embolism. As part of my medical decision making, I reviewed the following data within the electronic MEDICAL RECORD NUMBER Notes from prior ED visits  ----------------------------------------- 11:35 AM on  04/12/2018 -----------------------------------------  Patient with labored respirations.  EKG changes as well as fever at home.  HIV positive but with reportedly normal CD4 count as well as undetectable viral load.  Patient to be given antibiotics for community acquired pneumonia.  Will be admitted to the hospital for sepsis.  Patient understanding the diagnosis as well as treatment and willing to comply.  ____________________________________________   FINAL CLINICAL IMPRESSION(S) / ED DIAGNOSES  Sepsis.  Bilateral pneumonia.  NEW MEDICATIONS STARTED DURING THIS VISIT:  New Prescriptions   No medications on file     Note:  This document was prepared using Dragon voice recognition software and may include unintentional dictation errors.  Orbie Pyo, MD 04/12/18 1135

## 2018-04-12 NOTE — ED Triage Notes (Signed)
To ER via [facility] from group home c/o nonproductive cough, fever and chills X 5 days. No medications taken PTA. Nausea.  EMS reports 101.53F oral temperature, afebrile on arrival to ER. EDP at bedside. Pt wearing mask. States cannot sleep due to coughing. Pt alert and oriented X4, active, cooperative, pt in NAD. RR even and unlabored, color WNL.

## 2018-04-12 NOTE — H&P (Signed)
[facility] NAME: Danny Cook    MR#:  [id number]  DATE OF BIRTH:  1960-11-26  DATE OF ADMISSION:  04/12/2018  PRIMARY CARE PHYSICIAN: Katheren Shams   REQUESTING/REFERRING PHYSICIAN: [place]  CHIEF COMPLAINT:   Chief Complaint  Patient presents with  . Fever  . Cough    HISTORY OF PRESENT ILLNESS: Danny Cook  is a 57 y.o. male with a known history of anemia, anxiety, arthritis, CHF, chronic kidney disease, diabetes, HIV infection, hypertension, schizophrenia, sleep apnea-takes his HIV medications regularly and as per the last check 2 to 3 months ago his CD4 count was more than 800 and viral load was undetectable. For last 4 days he has fever and cough but no productive sputum.  Concerned with this he came to emergency room.  Noted to have some pneumonia and given to hospitalist team for further management after starting on broad-spectrum antibiotics by ER physician.  PAST MEDICAL HISTORY:   Past Medical History:  Diagnosis Date  . Anemia   . Anxiety   . Arthritis   . CHF (congestive heart failure) ([place])   . Chronic kidney disease    Renal Insufficiency Syndrome; Glomerulosclerosis 2013  . Complication of anesthesia   . Depressed   . Diabetes mellitus without complication ([place])   . High cholesterol   . HIV (human immunodeficiency virus infection) ([place])   . Hypertension   . 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 Paranoid disorder ([place])   . Schizophrenia, paranoid ([place])   . Sleep apnea     PAST SURGICAL HISTORY:  Past Surgical History:  Procedure Laterality Date  . COLONOSCOPY WITH PROPOFOL N/A 09/03/2015   Procedure: COLONOSCOPY WITH PROPOFOL;  Surgeon: Lollie Sails, MD;  Location[facility] ENDOSCOPY;  Service: Endoscopy;  Laterality: N/A;  . DG TEETH FULL    . ESOPHAGOGASTRODUODENOSCOPY (EGD) WITH PROPOFOL N/A 07/01/2016   Procedure: ESOPHAGOGASTRODUODENOSCOPY (EGD) WITH PROPOFOL;  Surgeon: Lollie Sails, MD;   Location: [facility] ENDOSCOPY;  Service: Endoscopy;  Laterality: N/A;    SOCIAL HISTORY:  Social History   Tobacco Use  . Smoking status: Never Smoker  . Smokeless tobacco: Never Used  Substance Use Topics  . Alcohol use: No    FAMILY HISTORY:  Family History  Problem Relation Age of Onset  . Heart attack Mother   . Diabetes Mellitus II Mother   . Mental illness Mother   . CAD Mother   . Heart attack Father   . CAD Father   . Hypertension Father     DRUG ALLERGIES:  Allergies  Allergen Reactions  . Trazodone And Nefazodone   . Viread [Tenofovir Disoproxil]     Damages kidneys  . Etodolac Rash    REVIEW OF SYSTEMS:   CONSTITUTIONAL: He have fever, fatigue or weakness.  EYES: No blurred or double vision.  EARS, NOSE, AND THROAT: No tinnitus or ear pain.  RESPIRATORY: Have cough, shortness of breath, no wheezing or hemoptysis.  CARDIOVASCULAR: No chest pain, orthopnea, edema.  GASTROINTESTINAL: No nausea, vomiting, diarrhea or abdominal pain.  GENITOURINARY: No dysuria, hematuria.  ENDOCRINE: No polyuria, nocturia,  HEMATOLOGY: No anemia, easy bruising or bleeding SKIN: No rash or lesion. MUSCULOSKELETAL: No joint pain or arthritis.   NEUROLOGIC: No tingling, numbness, weakness.  PSYCHIATRY: No anxiety or depression.   MEDICATIONS AT HOME:  Prior to Admission medications   Medication Sig Start Date End Date Taking? Authorizing Provider  Abacavir-Dolutegravir-Lamivud (TRIUMEQ) 600-50-300 MG TABS Take 1 tablet  by mouth daily.    [provider]  amiodarone (PACERONE) 200 MG tablet Two tabs po twice a day for two days then one tablet twice a day afterwards 03/27/18   Loletha Grayer, MD  aspirin 325 MG tablet Take 325 mg by mouth daily.    [provider]  butalbital-acetaminophen-caffeine (FIORICET, ESGIC) 50-325-40 MG tablet Take 1-2 tablets by mouth every 6 (six) hours as needed. 01/31/18 01/31/19  Earleen Newport, MD  clonazePAM (KLONOPIN) 1 MG  tablet Take 0.5-1 mg by mouth 3 (three) times daily. Take 1 mg in the morning, 0.5 mg mid-day and 1 mg in the evening.    [provider]  cyanocobalamin 1000 MCG tablet Take 1,000 mcg by mouth daily.    [provider]  diphenhydrAMINE (BENADRYL) 50 MG capsule Take 1 capsule (50 mg total) by mouth at bedtime as needed for sleep. 11/09/17   McNew, Tyson Babinski, MD  divalproex (DEPAKOTE ER) 250 MG 24 hr tablet Take 500 mg by mouth 2 (two) times daily.     [provider]  enalapril (VASOTEC) 2.5 MG tablet Take 1 tablet (2.5 mg total) by mouth daily. 03/28/18   Wieting, Richard, MD  FANAPT 12 MG TABS Take 12 mg by mouth 2 (two) times daily.  11/14/16   [provider]  insulin degludec (TRESIBA FLEXTOUCH) 100 UNIT/ML SOPN FlexTouch Pen Inject 0.2 mLs (20 Units total) into the skin daily. 03/27/18   Loletha Grayer, MD  liraglutide (VICTOZA) 18 MG/3ML SOPN Inject 1.8 mg into the skin daily.    [provider]  metFORMIN (GLUCOPHAGE) 1000 MG tablet Take 1,000 mg by mouth 2 (two) times daily with a meal.    [provider]  Nutritional Supplements (FEEDING SUPPLEMENT, NEPRO CARB STEADY,) LIQD Take 237 mLs by mouth 2 (two) times daily between meals. 03/27/18   Loletha Grayer, MD  omeprazole (PRILOSEC) 40 MG capsule Take 40 mg by mouth 2 (two) times daily.    [provider]  promethazine (PHENERGAN) 6.25 MG/5ML syrup Take 6.25 mg by mouth every 6 (six) hours as needed for nausea or vomiting.    [provider]  rosuvastatin (CRESTOR) 20 MG tablet Take 20 mg by mouth daily.    [provider]  triamcinolone cream (KENALOG) 0.1 % Apply 1 application topically 2 (two) times daily as needed. 11/16/16   Little, Traci M, PA-C  venlafaxine XR (EFFEXOR-XR) 75 MG 24 hr capsule Take 1 capsule (75 mg total) by mouth daily with breakfast. 11/09/17   McNew, Tyson Babinski, MD      PHYSICAL EXAMINATION:   VITAL SIGNS: Blood pressure 135/74, pulse 93,  temperature 98.3 F (36.8 C), temperature source Oral, resp. rate (!) 21, height 5\' 7"  (1.702 m), weight 100.6 kg, SpO2 96 %.  GENERAL:  57 y.o.-year-old patient lying in the bed with no acute distress.  EYES: Pupils equal, round, reactive to light and accommodation. No scleral icterus. Extraocular muscles intact.  HEENT: Head atraumatic, normocephalic. Oropharynx and nasopharynx clear.  NECK:  Supple, no jugular venous distention. No thyroid enlargement, no tenderness.  LUNGS: Normal breath sounds bilaterally, no wheezing, some crepitation. No use of accessory muscles of respiration.  CARDIOVASCULAR: S1, S2 normal. No murmurs, rubs, or gallops.  ABDOMEN: Soft, nontender, nondistended. Bowel sounds present. No organomegaly or mass.  EXTREMITIES: No pedal edema, cyanosis, or clubbing.  NEUROLOGIC: Cranial nerves II through XII are intact. Muscle strength 5/5 in all extremities. Sensation intact. Gait not checked.  PSYCHIATRIC: The  patient is alert and oriented x 3.  SKIN: No obvious rash, lesion, or ulcer.   LABORATORY PANEL:   CBC Recent Labs  Lab 04/12/18 1101  WBC 9.7  HGB 9.5*  HCT 28.3*  PLT 208  MCV 90.1  MCH 30.3  MCHC 33.6  RDW 13.8  LYMPHSABS 0.5*  MONOABS 0.9  EOSABS 0.0  BASOSABS 0.0   ------------------------------------------------------------------------------------------------------------------  Chemistries  Recent Labs  Lab 04/12/18 1101  NA 135  K 3.9  CL 99  CO2 22  GLUCOSE 196*  BUN 21*  CREATININE 1.55*  CALCIUM 8.3*  AST 28  ALT 27  ALKPHOS 80  BILITOT 0.6   ------------------------------------------------------------------------------------------------------------------ estimated creatinine clearance is 58 mL/min (A) (by C-G formula based on SCr of 1.55 mg/dL (H)). ------------------------------------------------------------------------------------------------------------------ No results for input(s): TSH, T4TOTAL, T3FREE, THYROIDAB in the  last 72 hours.  Invalid input(s): FREET3   Coagulation profile No results for input(s): INR, PROTIME in the last 168 hours. ------------------------------------------------------------------------------------------------------------------- No results for input(s): DDIMER in the last 72 hours. -------------------------------------------------------------------------------------------------------------------  Cardiac Enzymes Recent Labs  Lab 04/12/18 1101  TROPONINI <0.03   ------------------------------------------------------------------------------------------------------------------ Invalid input(s): POCBNP  ---------------------------------------------------------------------------------------------------------------  Urinalysis    Component Value Date/Time   COLORURINE YELLOW (A) 04/12/2018 1101   APPEARANCEUR HAZY (A) 04/12/2018 1101   APPEARANCEUR Clear 10/23/2014 1719   LABSPEC 1.023 04/12/2018 1101   LABSPEC 1.017 10/23/2014 1719   PHURINE 5.0 04/12/2018 1101   GLUCOSEU NEGATIVE 04/12/2018 1101   GLUCOSEU Negative 10/23/2014 1719   HGBUR SMALL (A) 04/12/2018 1101   BILIRUBINUR NEGATIVE 04/12/2018 1101   BILIRUBINUR Negative 10/23/2014 1719   KETONESUR 5 (A) 04/12/2018 1101   PROTEINUR 100 (A) 04/12/2018 1101   NITRITE NEGATIVE 04/12/2018 1101   LEUKOCYTESUR TRACE (A) 04/12/2018 1101   LEUKOCYTESUR Negative 10/23/2014 1719     RADIOLOGY: Dg Chest Port 1 View  Result Date: 04/12/2018 CLINICAL DATA:  Nonproductive cough, fever, chills EXAM: PORTABLE CHEST 1 VIEW COMPARISON:  03/29/2018 FINDINGS: Cardiomegaly with vascular congestion. Small left pleural effusion with left lower lobe atelectasis or infiltrate. Minimal right basilar opacity as well. No overt edema. No effusions scattered that no acute bony abnormality. IMPRESSION: Small left effusion. Bilateral lower lobe atelectasis or infiltrates, left greater than right. Mild cardiomegaly with vascular congestion.  Electronically Signed   By: Rolm Baptise M.D.   On: 04/12/2018 11:26    EKG: Orders placed or performed during the hospital encounter of 04/12/18  . ED EKG 12-Lead  . ED EKG 12-Lead  . EKG 12-Lead  . EKG 12-Lead    IMPRESSION AND PLAN:  *Healthcare associated pneumonia Patient was admitted to hospital 2 weeks ago for possible sepsis but it ruled out after work-up and ID suggested to stop antibiotics. Now he returns with pneumonia. We will continue broad-spectrum antibiotics for now and call infectious disease consult for further management. Also check for influenza.  *HIV He is under treatment for HIV, as per him last viral load was undetectable and CD4 count was more than 800- 2 months ago.  *Chronic renal failure stage III Appears stable currently, continue to monitor.  *Anemia of chronic disease due to CKD. Appears stable, continue to monitor.  *Hypertension Continue home medications and monitor.  *Diabetes Continue Tyler Aas, metformin and keep on sliding scale coverage.  All the records are reviewed and case discussed with ED provider. Management plans discussed with the patient, family and they are in agreement.  CODE STATUS: Full code. Code Status History    Date Active Date  Inactive Code Status Order ID Comments User Context   03/23/2018 2123 03/27/2018 1802 Full Code [id number]  Lance Coon, MD ED   11/04/2017 2050 11/09/2017 1400 Full Code [id number]  Gonzella Lex, MD Inpatient   07/31/2017 0240 08/01/2017 1952 Full Code [id number]  [name], MD ED       TOTAL TIME TAKING CARE OF THIS PATIENT: 45 minutes.    Vaughan Basta M.D on 04/12/2018   Between 7am to 6pm - Pager - [phone]  After 6pm go to [email or web address] - password [email or web address] [facility]  Office  [phone]  CC: Primary care physician; Katheren Shams   Note: This dictation was prepared with Dragon dictation along with smaller phrase technology. Any  transcriptional errors that result from this process are unintentional.

## 2018-04-13 ENCOUNTER — Inpatient Hospital Stay: Payer: Self-pay | Admitting: Infectious Diseases

## 2018-04-13 DIAGNOSIS — J9811 Atelectasis: Secondary | ICD-10-CM

## 2018-04-13 DIAGNOSIS — I509 Heart failure, unspecified: Secondary | ICD-10-CM

## 2018-04-13 DIAGNOSIS — Z888 Allergy status to other drugs, medicaments and biological substances status: Secondary | ICD-10-CM

## 2018-04-13 DIAGNOSIS — Z21 Asymptomatic human immunodeficiency virus [HIV] infection status: Secondary | ICD-10-CM

## 2018-04-13 DIAGNOSIS — R0602 Shortness of breath: Secondary | ICD-10-CM

## 2018-04-13 DIAGNOSIS — D3500 Benign neoplasm of unspecified adrenal gland: Secondary | ICD-10-CM

## 2018-04-13 DIAGNOSIS — N179 Acute kidney failure, unspecified: Secondary | ICD-10-CM

## 2018-04-13 DIAGNOSIS — R51 Headache: Secondary | ICD-10-CM

## 2018-04-13 DIAGNOSIS — D649 Anemia, unspecified: Secondary | ICD-10-CM

## 2018-04-13 DIAGNOSIS — E1122 Type 2 diabetes mellitus with diabetic chronic kidney disease: Secondary | ICD-10-CM

## 2018-04-13 DIAGNOSIS — F200 Paranoid schizophrenia: Secondary | ICD-10-CM

## 2018-04-13 DIAGNOSIS — I712 Thoracic aortic aneurysm, without rupture: Secondary | ICD-10-CM

## 2018-04-13 DIAGNOSIS — I480 Paroxysmal atrial fibrillation: Secondary | ICD-10-CM

## 2018-04-13 DIAGNOSIS — R05 Cough: Secondary | ICD-10-CM

## 2018-04-13 DIAGNOSIS — I313 Pericardial effusion (noninflammatory): Secondary | ICD-10-CM

## 2018-04-13 DIAGNOSIS — N189 Chronic kidney disease, unspecified: Secondary | ICD-10-CM

## 2018-04-13 DIAGNOSIS — Z79899 Other long term (current) drug therapy: Secondary | ICD-10-CM

## 2018-04-13 DIAGNOSIS — R509 Fever, unspecified: Secondary | ICD-10-CM

## 2018-04-13 DIAGNOSIS — F419 Anxiety disorder, unspecified: Secondary | ICD-10-CM

## 2018-04-13 LAB — BASIC METABOLIC PANEL
ANION GAP: 9 (ref 5–15)
BUN: 21 mg/dL — AB (ref 6–20)
CHLORIDE: 102 mmol/L (ref 98–111)
CO2: 27 mmol/L (ref 22–32)
Calcium: 8.4 mg/dL — ABNORMAL LOW (ref 8.9–10.3)
Creatinine, Ser: 1.45 mg/dL — ABNORMAL HIGH (ref 0.61–1.24)
GFR, EST AFRICAN AMERICAN: 59 mL/min — AB (ref >60)
GFR, EST NON AFRICAN AMERICAN: 51 mL/min — AB (ref >60)
Glucose, Bld: 177 mg/dL — ABNORMAL HIGH (ref 70–99)
POTASSIUM: 4.4 mmol/L (ref 3.5–5.1)
SODIUM: 138 mmol/L (ref 135–145)

## 2018-04-13 LAB — CBC
HEMATOCRIT: 26.6 % — AB (ref 39.0–52.0)
HEMOGLOBIN: 8.5 g/dL — AB (ref 13.0–17.0)
MCH: 29.5 pg (ref 26.0–34.0)
MCHC: 32 g/dL (ref 30.0–36.0)
MCV: 92.4 fL (ref 80.0–100.0)
Platelets: 154 10*3/uL (ref 150–400)
RBC: 2.88 MIL/uL — AB (ref 4.22–5.81)
RDW: 14.2 % (ref 11.5–15.5)
WBC: 6.9 10*3/uL (ref 4.0–10.5)
nRBC: 0 % (ref 0.0–0.2)

## 2018-04-13 LAB — GLUCOSE, CAPILLARY
GLUCOSE-CAPILLARY: 99 mg/dL (ref 70–99)
GLUCOSE-CAPILLARY: 120 mg/dL — AB (ref 70–99)
GLUCOSE-CAPILLARY: 148 mg/dL — AB (ref 70–99)
Glucose-Capillary: 153 mg/dL — ABNORMAL HIGH (ref 70–99)

## 2018-04-13 LAB — PROCALCITONIN: Procalcitonin: 0.4 ng/mL

## 2018-04-13 MED ORDER — METHYLPREDNISOLONE SODIUM SUCC 40 MG IJ SOLR
40.0000 mg | Freq: Every day | INTRAMUSCULAR | Status: DC
  Administered 2018-04-13 – 2018-04-14 (×2): 40 mg via INTRAVENOUS
  Filled 2018-04-13 (×2): qty 1

## 2018-04-13 MED ORDER — HYDROCOD POLST-CPM POLST ER 10-8 MG/5ML PO SUER
5.0000 mL | Freq: Two times a day (BID) | ORAL | Status: DC | PRN
  Administered 2018-04-13: 5 mL via ORAL
  Filled 2018-04-13: qty 5

## 2018-04-13 NOTE — Consult Note (Signed)
NAME: Danny Cook.  DOB: 1960-07-13  MRN: [id number]  Date/Time: 04/13/2018 9:13 PM Subjective:  REASON FOR CONSULT: HIV and pneumonia ? Danny Cook. is a 57 y.o. male with history of HIV, CHF, chronic kidney disease(FSGN), schizophrenia  is admitted with cough, fever of 5 days duration. Pt was recently in [facility] between 9/21-9/2 6 was admitted for dizziness, nausa and headaches and cough and found to be hypotensive, AKI on CKD and was in ICU and treated with fluids and pressors. 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 As there was no tamponade no pericardiocentesis was done. He also had high ESR and was seen by rheumatologist and work up including ANA, RH factor, ANCA were negative. HE was treated with IV antibiotics for a few days and also was given meningitis like treatment for a day  as he had become drowsy in the ICU.all the antibiotics were stopped as there was no evidence clinically of meningitis or encephalitis.  CT scan of the head, MRI of the brain and MRA of the brain was negative for acute pathology.  Carotid ultrasound negative.  EEG negative for seizures.  Mental status improved. The blood cultures were also negative. Pt was also very anxious from his underlying mental health problem.  After discharge on 9/28 he returned to the ED on 9/30 complaining of feeling anxious and sob. He did not want to live alone as it was making him anxious and so he was sent to an assisted living.  He says he has been doing okay at the assisted living until 5 days ago when he developed non productive cough and fever and was sent to the ED. EMS documented a fever of > 101 but no fever since admission- Normal WBC, CXR showed lower lobe atelectasis VS infiltrate. Flu test was negative I am asked to see him for HIV and possible pneumonia   Past Medical History:  Diagnosis Date  . Anemia     . Anxiety   . Arthritis   . CHF (congestive heart failure) ([place])   . Chronic kidney disease    Renal Insufficiency Syndrome; Glomerulosclerosis 2013  . Complication of anesthesia   . Depressed   . Diabetes mellitus without complication ([place])   . High cholesterol   . HIV (human immunodeficiency virus infection) ([place])   . Hypertension   . 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 Paranoid disorder ([place])   . Schizophrenia, paranoid ([place])   . Sleep apnea     Past Surgical History:  Procedure Laterality Date  . COLONOSCOPY WITH PROPOFOL N/A 09/03/2015   Procedure: COLONOSCOPY WITH PROPOFOL;  Surgeon: Lollie Sails, MD;  Location: [facility] ENDOSCOPY;  Service: Endoscopy;  Laterality: N/A;  . DG TEETH FULL    . ESOPHAGOGASTRODUODENOSCOPY (EGD) WITH PROPOFOL N/A 07/01/2016   Procedure: ESOPHAGOGASTRODUODENOSCOPY (EGD) WITH PROPOFOL;  Surgeon: Lollie Sails, MD;  Location: [facility] ENDOSCOPY;  Service: Endoscopy;  Laterality: N/A;      Family History  Problem Relation Age of Onset  . Heart attack Mother   . Diabetes Mellitus II Mother   . Mental illness Mother   . CAD Mother   . Heart attack Father   . CAD Father   . Hypertension Father    Allergies  Allergen Reactions  . Trazodone And Nefazodone   . Viread [Tenofovir Disoproxil]     Damages kidneys  . Etodolac Rash  ?  Current Facility-Administered Medications  Medication Dose Route Frequency Provider Last Rate Last Dose  . abacavir-dolutegravir-lamiVUDine (TRIUMEQ) 322-02-542 MG per tablet 1 tablet  1 tablet Oral Daily Vaughan Basta, MD   1 tablet at 04/13/18 7062  . amiodarone (PACERONE) tablet 200 mg  200 mg Oral BID Vaughan Basta, MD   200 mg at 04/13/18 2032  . aspirin EC tablet 325 mg  325 mg Oral Daily Vaughan Basta, MD   325 mg at 04/13/18 0918  . butalbital-acetaminophen-caffeine (FIORICET, ESGIC) 50-325-40 MG per tablet 1-2 tablet  1-2 tablet Oral Q6H PRN Vaughan Basta, MD   2 tablet at 04/13/18 0700   . ceFEPIme (MAXIPIME) 2 g in sodium chloride 0.9 % 100 mL IVPB  2 g Intravenous Q12H Rocky Morel, RPH 200 mL/hr at 04/13/18 2039 2 g at 04/13/18 2039  . chlorpheniramine-HYDROcodone (TUSSIONEX) 10-8 MG/5ML suspension 5 mL  5 mL Oral Q12H PRN Loletha Grayer, MD   5 mL at 04/13/18 1500  . clonazePAM (KLONOPIN) tablet 0.5 mg  0.5 mg Oral BID PRN Vaughan Basta, MD   0.5 mg at 04/12/18 1657  . clonazePAM (KLONOPIN) tablet 1 mg  1 mg Oral BID Vaughan Basta, MD   1 mg at 04/13/18 2032  . diphenhydrAMINE (BENADRYL) capsule 50 mg  50 mg Oral QHS PRN Vaughan Basta, MD      . divalproex (DEPAKOTE ER) 24 hr tablet 500 mg  500 mg Oral BID Vaughan Basta, MD   500 mg at 04/13/18 2032  . docusate sodium (COLACE) capsule 100 mg  100 mg Oral BID PRN Vaughan Basta, MD      . enalapril (VASOTEC) tablet 2.5 mg  2.5 mg Oral Daily Vaughan Basta, MD   2.5 mg at 04/13/18 1213  . feeding supplement (NEPRO CARB STEADY) liquid 237 mL  237 mL Oral BID BM Vaughan Basta, MD   237 mL at 04/13/18 1510  . guaiFENesin-codeine 100-10 MG/5ML solution 5 mL  5 mL Oral Q6H PRN Vaughan Basta, MD   5 mL at 04/12/18 2356  . heparin injection 5,000 Units  5,000 Units Subcutaneous Q8H Vaughan Basta, MD   5,000 Units at 04/13/18 2032  . iloperidone (FANAPT) tablet 12 mg  12 mg Oral BID Vaughan Basta, MD   12 mg at 04/13/18 2032  . insulin aspart (novoLOG) injection 0-9 Units  0-9 Units Subcutaneous TID WC Vaughan Basta, MD   2 Units at 04/13/18 1731  . metFORMIN (GLUCOPHAGE) tablet 1,000 mg  1,000 mg Oral BID WC Vaughan Basta, MD   1,000 mg at 04/13/18 1731  . methylPREDNISolone sodium succinate (SOLU-MEDROL) 40 mg/mL injection 40 mg  40 mg Intravenous Daily Loletha Grayer, MD   40 mg at 04/13/18 1511  . pantoprazole (PROTONIX) EC tablet 40 mg  40 mg Oral Daily Vaughan Basta, MD   40 mg at 04/13/18 0918  . promethazine  (PHENERGAN) 6.25 MG/5ML syrup 6.25 mg  6.25 mg Oral Q6H PRN Vaughan Basta, MD      . rosuvastatin (CRESTOR) tablet 20 mg  20 mg Oral Daily Vaughan Basta, MD   20 mg at 04/13/18 0918  . triamcinolone cream (KENALOG) 0.1 % 1 application  1 application Topical BID PRN Vaughan Basta, MD      . venlafaxine XR (EFFEXOR-XR) 24 hr capsule 75 mg  75 mg Oral Q breakfast Vaughan Basta, MD   75 mg at 04/13/18 0918  . vitamin B-12 (CYANOCOBALAMIN) tablet 1,000 mcg  1,000 mcg Oral Daily Vaughan Basta, MD  1,000 mcg at 04/13/18 2992     Abtx:  Anti-infectives (From admission, onward)   Start     Dose/Rate Route Frequency Ordered Stop   04/13/18 1000  abacavir-dolutegravir-lamiVUDine (TRIUMEQ) 600-50-300 MG per tablet 1 tablet     1 tablet Oral Daily 04/12/18 1407     04/12/18 2200  ceFEPIme (MAXIPIME) 2 g in sodium chloride 0.9 % 100 mL IVPB     2 g 200 mL/hr over 30 Minutes Intravenous Every 12 hours 04/12/18 1419     04/12/18 1800  vancomycin (VANCOCIN) IVPB 1000 mg/200 mL premix  Status:  Discontinued     1,000 mg 200 mL/hr over 60 Minutes Intravenous Every 18 hours 04/12/18 1419 04/13/18 1200   04/12/18 1145  vancomycin (VANCOCIN) IVPB 1000 mg/200 mL premix     1,000 mg 200 mL/hr over 60 Minutes Intravenous  Once 04/12/18 1142 04/12/18 1300   04/12/18 1145  ceFEPIme (MAXIPIME) 2 g in sodium chloride 0.9 % 100 mL IVPB     2 g 200 mL/hr over 30 Minutes Intravenous  Once 04/12/18 1142 04/12/18 1239   04/12/18 1145  azithromycin (ZITHROMAX) 500 mg in sodium chloride 0.9 % 250 mL IVPB  Status:  Discontinued     500 mg 250 mL/hr over 60 Minutes Intravenous Every 24 hours 04/12/18 1142 04/12/18 1530      REVIEW OF SYSTEMS:  Const: fever, negative chills, negative weight loss Eyes: negative diplopia or visual changes, negative eye pain ENT: negative coryza, negative sore throat Resp:  cough, no hemoptysis, positive dyspnea Cards: cough causing  chest  pain, palpitations, lower extremity edema GU: negative for frequency, dysuria and hematuria GI: Negative for abdominal pain, diarrhea, bleeding, constipation Skin: negative for rash and pruritus Heme: negative for easy bruising and gum/nose bleeding MS: negative for myalgias, arthralgias, back pain and muscle weakness Neurolo:no  Headaches or dizziness now, vertigo, memory problems  Psych:anxiety,   Endocrine: negative for thyroid, diabetes Allergy/Immunology- negative for any medication or food allergies ? Pertinent Positives include : Objective:  VITALS:  BP 115/72 (BP Location: Left Arm)   Pulse 71   Temp 97.8 F (36.6 C) (Oral)   Resp 16   Ht _0  (1.702 m)   Wt 93.9 kg   SpO2 97%   BMI 32.42 kg/m  PHYSICAL EXAM:  General: Alert, cooperative, no distress, appears stated age.  Head: Normocephalic, without obvious abnormality, atraumatic. Eyes: Conjunctivae clear, anicteric sclerae. Pupils are equal ENT Nares normal. No drainage or sinus tenderness. Lips, mucosa, and tongue normal. No Thrush Neck: Supple, symmetrical, no adenopathy, thyroid: non tender no carotid bruit and no JVD. Back: No CVA tenderness. Lungs: Clear to auscultation bilaterally. No Wheezing or Rhonchi. No rales. Heart: Regular rate and rhythm, no murmur, rub or gallop. Abdomen: Soft, non-tender,not distended. Bowel sounds normal. No masses Extremities: atraumatic, no cyanosis. No edema. No clubbing Skin: No rashes or lesions. Or bruising Lymph: Cervical, supraclavicular normal. Neurologic: Grossly non-focal Pertinent Labs HB    CBC Latest Ref Rng & Units 04/13/2018 04/12/2018 03/29/2018  WBC 4.0 - 10.5 K/uL 6.9 9.7 7.3  Hemoglobin 13.0 - 17.0 g/dL 8.5(L) 9.5(L) 10.1(L)  Hematocrit 39.0 - 52.0 % 26.6(L) 28.3(L) 28.9(L)  Platelets 150 - 400 K/uL 154 208 277   CMP Latest Ref Rng & Units 04/13/2018 04/12/2018 03/29/2018  Glucose 70 - 99 mg/dL 177(H) 196(H) 161(H)  BUN 6 - 20 mg/dL 21(H) 21(H) 24(H)    Creatinine 0.61 - 1.24 mg/dL 1.45(H) 1.55(H) 1.10  Sodium 135 -  145 mmol/L 138 135 140  Potassium 3.5 - 5.1 mmol/L 4.4 3.9 4.1  Chloride 98 - 111 mmol/L 102 99 110  CO2 22 - 32 mmol/L _0 Calcium 8.9 - 10.3 mg/dL 8.4(L) 8.3(L) 9.0  Total Protein 6.5 - 8.1 g/dL - 7.1 -  Total Bilirubin 0.3 - 1.2 mg/dL - 0.6 -  Alkaline Phos 38 - 126 U/L - 80 -  AST 15 - 41 U/L - 28 -  ALT 0 - 44 U/L - 27 -     IMAGING RESULTS: ? Impression/Recommendation ?57 y.o. male with history of HIV, CHF, chronic kidney disease(FSGN), schizophrenia  is admitted with cough, fever of 5 days duration.  Recurrent visits to ED and hospitalization with constellation of symptoms like cough, SOB, headache, anxiety Not clear what the underlying pathology is? He has following positive  findings  Anemia since Sept 2019 High ESR > 100 Pericardial effusion Adrenal adenoma  Which has increased in size Fluctuating renal function Ascending thoracic Aortic aneurysm 4.3 cm  Need to screen for cortisol deficiency or excess Also need to r/o pheo Serum electrophoresis quantiferon Gold  Cough Levie [place]- CXR shows lower lobe atelectasis/left lower lobe infiltrate- Recommend  CT chest? On vanco and cefepime and azithromycin and now only on cefepime.  Also is on steroids and he feels much better with it.  HIV- well controlled -Vl < 20 and Cd4 > 1000 on triumeq( dolutegravir/abacavir and 3tc) If creatinine  worsens we may have to change this regimen as 3TC will have to be adjusted for crcl  DM- on multiple medications  Paranoid schizophrenia on depakote and iloperidone ?paroxysmal afib on amiodarone- watch for lung toxicity ___________________________________________________ Discussed with patient, and Dr.Wieting

## 2018-04-13 NOTE — Clinical Social Work Note (Signed)
Clinical Social Work Assessment  Patient Details  Name: Danny Cook. MRN: [id number] Date of Birth: [birth year]  Date of referral:  04/13/18               Reason for consult:  Facility Placement                Permission sought to share information with:  Case Manager, Customer service manager, Family Supports Permission granted to share information::  Yes, Verbal Permission Granted  Name::        Agency::     Relationship::     Contact Information:     Housing/Transportation Living arrangements for the past 2 months:  Group Home Source of Information:  Patient Patient Interpreter Needed:  None Criminal Activity/Legal Involvement Pertinent to Current Situation/Hospitalization:  No - Comment as needed Significant Relationships:  Parents, Siblings Lives with:  Facility Resident Do you feel safe going back to the place where you live?  Yes Need for family participation in patient care:  Yes (Comment)  Care giving concerns:  Patient lives at [facility] in [place] Worker assessment / plan:  [name] consulted for facility placement. [name] met with patient to discuss discharge plan. Patient is alert and oriented x3. [name] introduced self and explained role. Patient states that he lives at [facility] and has been there about a month. Per patient, he was living alone prior to group home and has support from his father and brother. Patient reports that he plans to return to group home at discharge. [name] contacted [facility] [phone] and they confirmed that patient is a resident there and can return when ready. [name] will follow for discharge planning.   Employment status:  Disabled (Comment on whether or not currently receiving Disability) Insurance information:  Medicare PT Recommendations:  Not assessed at this time Information / Referral to community resources:     Patient/Family's Response to care:  Patient thanked [name] for assistance    Patient/Family's Understanding of and Emotional Response to Diagnosis, Current Treatment, and Prognosis:  Patient states he is here for Pneumonia and understands current treatment plan   Emotional Assessment Appearance:  Appears stated age Attitude/Demeanor/Rapport:    Affect (typically observed):  Pleasant, Hopeful Orientation:  Oriented to Self, Oriented to Place, Oriented to  Time Alcohol / Substance use:  Not Applicable Psych involvement (Current and /or in the community):  No (Comment)  Discharge Needs  Concerns to be addressed:  Discharge Planning Concerns Readmission within the last 30 days:  Yes Current discharge risk:  None Barriers to Discharge:  Continued Medical Work up   [employer], [place] 04/13/2018, 9:19 AM

## 2018-04-13 NOTE — Progress Notes (Signed)
ID Full consult note to follow Pt with HIV ( well controlled on triumeq) admitted with cough and fever No sputum CXR shows either b/l atelectasis or infiltrates He is on cefepime + vanco Recently was in the hospital for dizziness, nasuea, headache and was found to be AKI on CKD with a small pericardial effusion, had high ESR Not sure what is going on as he seems to be getting hospitalized frequently ??CT chest

## 2018-04-13 NOTE — NC FL2 (Signed)
[place] LEVEL OF CARE SCREENING TOOL     IDENTIFICATION  Patient Name: Danny Cook. Birthdate: December 18, 1960 Sex: male Admission Date (Current Location): 04/12/2018  [place] and [place] Number:  Engineering geologist and Address:  [facility], [address], [place], [place] [address]      Provider Number: [id number]  Attending Physician Name and Address:  Loletha Grayer, MD  Relative Name and Phone Number:  Jaelynn Currier- father [phone]    Current Level of Care: Hospital Recommended Level of Care: Other (Comment)(Group Home ) Prior Approval Number:    Date Approved/Denied:   PASRR Number:    Discharge Plan: Other (Comment)(Group Home )    Current Diagnoses: Patient Active Problem List   Diagnosis Date Noted  . Community acquired pneumonia 04/12/2018  . Pneumonia 04/12/2018  . AKI (acute kidney injury) ([place]) 03/23/2018  . Pericarditis with effusion 03/23/2018  . Schizoaffective disorder, depressive type ([place]) 11/04/2017  . HIV positive ([place]) 11/04/2017  . Orthostatic dizziness 07/31/2017  . Chronic diastolic heart failure ([place]) 07/29/2017  . HTN (hypertension) 07/29/2017  . Diabetes ([place]) 07/29/2017  . Schizophrenia ([place]) 07/29/2017    Orientation RESPIRATION BLADDER Height & Weight     Self, Time, Place  Normal Continent Weight: 207 lb (93.9 kg) Height:  5\' 7"  (170.2 cm)  BEHAVIORAL SYMPTOMS/MOOD NEUROLOGICAL BOWEL NUTRITION STATUS  (none) (none) Continent Diet(heart healthy )  AMBULATORY STATUS COMMUNICATION OF NEEDS Skin   Limited Assist Verbally Normal                       Personal Care Assistance Level of Assistance  Bathing, Feeding, Dressing Bathing Assistance: Limited assistance Feeding assistance: Independent Dressing Assistance: Limited assistance     Functional Limitations Info  Sight, Hearing, Speech Sight Info: Adequate Hearing Info: Adequate Speech Info: Adequate     SPECIAL CARE FACTORS FREQUENCY                       Contractures Contractures Info: Not present    Additional Factors Info  Code Status, Allergies Code Status Info: Full Code              Current Medications (04/13/2018):  This is the current hospital active medication list Current Facility-Administered Medications  Medication Dose Route Frequency Provider Last Rate Last Dose  . abacavir-dolutegravir-lamiVUDine (TRIUMEQ) 008-67-619 MG per tablet 1 tablet  1 tablet Oral Daily Vaughan Basta, MD   1 tablet at 04/13/18 5093  . amiodarone (PACERONE) tablet 200 mg  200 mg Oral BID Vaughan Basta, MD   200 mg at 04/13/18 0918  . aspirin EC tablet 325 mg  325 mg Oral Daily Vaughan Basta, MD   325 mg at 04/13/18 0918  . butalbital-acetaminophen-caffeine (FIORICET, ESGIC) 50-325-40 MG per tablet 1-2 tablet  1-2 tablet Oral Q6H PRN Vaughan Basta, MD   2 tablet at 04/13/18 0700  . ceFEPIme (MAXIPIME) 2 g in sodium chloride 0.9 % 100 mL IVPB  2 g Intravenous Q12H Rocky Morel, RPH 200 mL/hr at 04/13/18 0926 2 g at 04/13/18 0926  . clonazePAM (KLONOPIN) tablet 0.5 mg  0.5 mg Oral BID PRN Vaughan Basta, MD   0.5 mg at 04/12/18 1657  . clonazePAM (KLONOPIN) tablet 1 mg  1 mg Oral BID Vaughan Basta, MD   1 mg at 04/13/18 0918  . diphenhydrAMINE (BENADRYL) capsule 50 mg  50 mg Oral QHS PRN Vaughan Basta, MD      .  divalproex (DEPAKOTE ER) 24 hr tablet 500 mg  500 mg Oral BID Vaughan Basta, MD   500 mg at 04/13/18 0920  . docusate sodium (COLACE) capsule 100 mg  100 mg Oral BID PRN Vaughan Basta, MD      . enalapril (VASOTEC) tablet 2.5 mg  2.5 mg Oral Daily Vaughan Basta, MD      . feeding supplement (NEPRO CARB STEADY) liquid 237 mL  237 mL Oral BID BM Vaughan Basta, MD   237 mL at 04/13/18 0922  . guaiFENesin-codeine 100-10 MG/5ML solution 5 mL  5 mL Oral Q6H PRN Vaughan Basta, MD    5 mL at 04/12/18 2356  . heparin injection 5,000 Units  5,000 Units Subcutaneous Q8H Vaughan Basta, MD   5,000 Units at 04/13/18 [id number]  . iloperidone (FANAPT) tablet 12 mg  12 mg Oral BID Vaughan Basta, MD   12 mg at 04/13/18 0917  . insulin aspart (novoLOG) injection 0-9 Units  0-9 Units Subcutaneous TID WC Vaughan Basta, MD      . metFORMIN (GLUCOPHAGE) tablet 1,000 mg  1,000 mg Oral BID WC Vaughan Basta, MD   1,000 mg at 04/13/18 0921  . pantoprazole (PROTONIX) EC tablet 40 mg  40 mg Oral Daily Vaughan Basta, MD   40 mg at 04/13/18 0918  . promethazine (PHENERGAN) 6.25 MG/5ML syrup 6.25 mg  6.25 mg Oral Q6H PRN Vaughan Basta, MD      . rosuvastatin (CRESTOR) tablet 20 mg  20 mg Oral Daily Vaughan Basta, MD   20 mg at 04/13/18 0918  . triamcinolone cream (KENALOG) 0.1 % 1 application  1 application Topical BID PRN Vaughan Basta, MD      . vancomycin (VANCOCIN) IVPB 1000 mg/200 mL premix  1,000 mg Intravenous Q18H Rocky Morel, RPH 185 mL/hr at 04/12/18 1716 1,000 mg at 04/12/18 1716  . venlafaxine XR (EFFEXOR-XR) 24 hr capsule 75 mg  75 mg Oral Q breakfast Vaughan Basta, MD   75 mg at 04/13/18 0918  . vitamin B-12 (CYANOCOBALAMIN) tablet 1,000 mcg  1,000 mcg Oral Daily Vaughan Basta, MD   1,000 mcg at 04/13/18 8841     Discharge Medications: Please see discharge summary for a list of discharge medications.  Relevant Imaging Results:  Relevant Lab Results:   Additional Information    Lyle Niblett  Louretta Shorten, LCSWA

## 2018-04-13 NOTE — Progress Notes (Signed)
Inpatient Diabetes Program Recommendations  AACE/ADA: New Consensus Statement on Inpatient Glycemic Control (2019)  Target Ranges:  Prepandial:   less than 140 mg/dL      Peak postprandial:   less than 180 mg/dL (1-2 hours)      Critically ill patients:  140 - 180 mg/dL   Results for Danny Cook, Danny Cook (MRN [id number]) as of 04/13/2018 07:41  Ref. Range 04/12/2018 13:59 04/12/2018 16:46 04/12/2018 20:31 04/13/2018 07:39  Glucose-Capillary Latest Ref Range: 70 - 99 mg/dL 169 (H) 114 (H) 94 99   Review of Glycemic Control  Diabetes history: DM2 Outpatient Diabetes medications: Tresiba 20 units daily (if CBG <85; if CBG > 85 uses Victoza), Victoza 1.8 mg (if CBG >85; uses [place] if CBG <85), Metformin 1000 mg BID Current orders for Inpatient glycemic control: Lantus 20 units daily, Novolog 0-9 units TID with meals, Metformin 1000 mg BID  Inpatient Diabetes Program Recommendations:  Insulin - Basal: No insulin given since admitted and fasting glucose 99 mg/dl today. Please discontinue Lantus. Correction (SSI): Please consider ordering Novolog 0-5 units QHS for bedtime correction scale.  Thanks, Barnie Alderman, RN, MSN, CDE Diabetes Coordinator Inpatient Diabetes Program [phone] (Team Pager from 8am to 5pm)

## 2018-04-13 NOTE — Progress Notes (Addendum)
Patient ID: Danny Cook., male   DOB: 04/04/1961, 57 y.o.   MRN: [id number]  [facility] PROGRESS NOTE  Danny Cook. BOF:[id number] DOB: 01/01/1961 DOA: 04/12/2018 PCP: Katheren Shams  HPI/Subjective: Patient stated he had fever and chills and coughing quite a bit.  Found to have a pneumonia.  He still not feeling well.  Still coughing quite a bit  Objective: Vitals:   04/13/18 0444 04/13/18 0904  BP: 107/69 110/69  Pulse: 68 68  Resp: 19 18  Temp: 98.2 F (36.8 C) 98.2 F (36.8 C)  SpO2: 94% 96%    Intake/Output Summary (Last 24 hours) at 04/13/2018 1459 Last data filed at 04/13/2018 1015 Gross per 24 hour  Intake 866.75 ml  Output -  Net 866.75 ml   Filed Weights   04/12/18 1054 04/12/18 1400  Weight: 100.6 kg 93.9 kg    ROS: Review of Systems  Constitutional: Negative for chills and fever.  Eyes: Negative for blurred vision.  Respiratory: Positive for cough and shortness of breath.   Cardiovascular: Positive for chest pain.  Gastrointestinal: Negative for abdominal pain, constipation, diarrhea, nausea and vomiting.  Genitourinary: Negative for dysuria.  Musculoskeletal: Negative for joint pain.  Neurological: Negative for dizziness and headaches.   Exam: Physical Exam  Constitutional: He is oriented to person, place, and time.  HENT:  Nose: No mucosal edema.  Mouth/Throat: No oropharyngeal exudate or posterior oropharyngeal edema.  Eyes: Pupils are equal, round, and reactive to light. Conjunctivae, EOM and lids are normal.  Neck: No JVD present. Carotid bruit is not present. No edema present. No thyroid mass and no thyromegaly present.  Cardiovascular: S1 normal and S2 normal. Exam reveals no gallop.  No murmur heard. Pulses:      Dorsalis pedis pulses are 2+ on the right side, and 2+ on the left side.  Respiratory: No respiratory distress. He has decreased breath sounds in the right lower field and the left lower field.  He has no wheezes. He has no rhonchi. He has no rales.  Unable to take a deep breath  GI: Soft. Bowel sounds are normal. There is no tenderness.  Musculoskeletal:       Right ankle: He exhibits no swelling.       Left ankle: He exhibits no swelling.  Lymphadenopathy:    He has no cervical adenopathy.  Neurological: He is alert and oriented to person, place, and time. No cranial nerve deficit.  Skin: Skin is warm. No rash noted. Nails show no clubbing.  Psychiatric: He has a normal mood and affect.      Data Reviewed: Basic Metabolic Panel: Recent Labs  Lab 04/12/18 1101 04/13/18 0411  NA 135 138  K 3.9 4.4  CL 99 102  CO2 22 27  GLUCOSE 196* 177*  BUN 21* 21*  CREATININE 1.55* 1.45*  CALCIUM 8.3* 8.4*   Liver Function Tests: Recent Labs  Lab 04/12/18 1101  AST 28  ALT 27  ALKPHOS 80  BILITOT 0.6  PROT 7.1  ALBUMIN 2.8*   CBC: Recent Labs  Lab 04/12/18 1101 04/13/18 0411  WBC 9.7 6.9  NEUTROABS 8.2*  --   HGB 9.5* 8.5*  HCT 28.3* 26.6*  MCV 90.1 92.4  PLT 208 154   Cardiac Enzymes: Recent Labs  Lab 04/12/18 1101  TROPONINI <0.03   BNP (last 3 results) Recent Labs    03/29/18 1108  BNP 235.0*     CBG: Recent Labs  Lab 04/12/18 1359 04/12/18 1646  04/12/18 2031 04/13/18 0739 04/13/18 1150  GLUCAP 169* 114* 94 99 120*    Recent Results (from the past 240 hour(s))  Blood Culture (routine x 2)     Status: None (Preliminary result)   Collection Time: 04/12/18 11:01 AM  Result Value Ref Range Status   Specimen Description BLOOD RIGHT HAND  Final   Special Requests   Final    BOTTLES DRAWN AEROBIC AND ANAEROBIC Blood Culture adequate volume   Culture   Final    NO GROWTH < 24 HOURS Performed at [facility], [address] [place], [place] [address]    Report Status PENDING  Incomplete  Blood Culture (routine x 2)     Status: None (Preliminary result)   Collection Time: 04/12/18 11:01 AM  Result Value Ref Range Status    Specimen Description BLOOD RIGHT AC  Final   Special Requests   Final    BOTTLES DRAWN AEROBIC AND ANAEROBIC Blood Culture adequate volume   Culture   Final    NO GROWTH < 24 HOURS Performed at [facility], [address], [place], [place] [address]    Report Status PENDING  Incomplete     Studies: Dg Chest Port 1 View  Result Date: 04/12/2018 CLINICAL DATA:  Nonproductive cough, fever, chills EXAM: PORTABLE CHEST 1 VIEW COMPARISON:  03/29/2018 FINDINGS: Cardiomegaly with vascular congestion. Small left pleural effusion with left lower lobe atelectasis or infiltrate. Minimal right basilar opacity as well. No overt edema. No effusions scattered that no acute bony abnormality. IMPRESSION: Small left effusion. Bilateral lower lobe atelectasis or infiltrates, left greater than right. Mild cardiomegaly with vascular congestion. Electronically Signed   By: Rolm Baptise M.D.   On: 04/12/2018 11:26    Scheduled Meds: . abacavir-dolutegravir-lamiVUDine  1 tablet Oral Daily  . amiodarone  200 mg Oral BID  . aspirin EC  325 mg Oral Daily  . clonazePAM  1 mg Oral BID  . divalproex  500 mg Oral BID  . enalapril  2.5 mg Oral Daily  . feeding supplement (NEPRO CARB STEADY)  237 mL Oral BID BM  . heparin  5,000 Units Subcutaneous Q8H  . iloperidone  12 mg Oral BID  . insulin aspart  0-9 Units Subcutaneous TID WC  . metFORMIN  1,000 mg Oral BID WC  . methylPREDNISolone (SOLU-MEDROL) injection  40 mg Intravenous Daily  . pantoprazole  40 mg Oral Daily  . rosuvastatin  20 mg Oral Daily  . venlafaxine XR  75 mg Oral Q breakfast  . cyanocobalamin  1,000 mcg Oral Daily   Continuous Infusions: . ceFEPime (MAXIPIME) IV 2 g (04/13/18 0926)    Assessment/Plan:  1. Pneumonia on Maxipime.  MRSA PCR negative so vancomycin was discontinued.  Cough medication ordered 2. Pleuritic chest pain start Solu-Medrol 40 mg IV daily 3. Type 2 diabetes mellitus on sliding scale for right now holding  other medications 4. HIV.  Continue current medications 5. Chronic kidney disease stage III.  Watch closely 6. Hypertension continue usual medications 7. Hyperlipidemia unspecified on Crestor 8. GERD on Protonix 9. Anemia and on ferritin.  Likely of chronic disease  Code Status:     Code Status Orders  (From admission, onward)         Start     Ordered   04/12/18 1408  Full code  Continuous     04/12/18 1407        Code Status History    Date Active Date Inactive Code  Status Order ID Comments User Context   03/23/2018 2123 03/27/2018 1802 Full Code [id number]  Lance Coon, MD ED   11/04/2017 2050 11/09/2017 1400 Full Code [id number]  Gonzella Lex, MD Inpatient   07/31/2017 0240 08/01/2017 1952 Full Code [id number]  [name], MD ED     Family Communication: Father initially before I saw him Disposition Plan: As per social worker is at a group home now  Antibiotics:  Cefepime  Time spent: 28 minutes  [place]

## 2018-04-14 ENCOUNTER — Inpatient Hospital Stay: Payer: Medicare Other

## 2018-04-14 LAB — BASIC METABOLIC PANEL
Anion gap: 8 (ref 5–15)
BUN: 24 mg/dL — ABNORMAL HIGH (ref 6–20)
CO2: 25 mmol/L (ref 22–32)
Calcium: 8.7 mg/dL — ABNORMAL LOW (ref 8.9–10.3)
Chloride: 104 mmol/L (ref 98–111)
Creatinine, Ser: 1.27 mg/dL — ABNORMAL HIGH (ref 0.61–1.24)
GFR calc Af Amer: >60 mL/min (ref >60)
Glucose, Bld: 154 mg/dL — ABNORMAL HIGH (ref 70–99)
POTASSIUM: 4.7 mmol/L (ref 3.5–5.1)
SODIUM: 137 mmol/L (ref 135–145)

## 2018-04-14 LAB — CBC
HCT: 26.3 % — ABNORMAL LOW (ref 39.0–52.0)
HEMOGLOBIN: 8.4 g/dL — AB (ref 13.0–17.0)
MCH: 29.5 pg (ref 26.0–34.0)
MCHC: 31.9 g/dL (ref 30.0–36.0)
MCV: 92.3 fL (ref 80.0–100.0)
Platelets: 169 10*3/uL (ref 150–400)
RBC: 2.85 MIL/uL — ABNORMAL LOW (ref 4.22–5.81)
RDW: 14.1 % (ref 11.5–15.5)
WBC: 7 10*3/uL (ref 4.0–10.5)
nRBC: 0 % (ref 0.0–0.2)

## 2018-04-14 LAB — GLUCOSE, CAPILLARY
GLUCOSE-CAPILLARY: 148 mg/dL — AB (ref 70–99)
Glucose-Capillary: 111 mg/dL — ABNORMAL HIGH (ref 70–99)

## 2018-04-14 LAB — FERRITIN: FERRITIN: 269 ng/mL (ref 24–336)

## 2018-04-14 LAB — SEDIMENTATION RATE: Sed Rate: 127 mm/hr — ABNORMAL HIGH (ref 0–20)

## 2018-04-14 MED ORDER — HYDROCOD POLST-CPM POLST ER 10-8 MG/5ML PO SUER: 5.0000 mL | Freq: Two times a day (BID) | ORAL | 0 refills | Status: DC | PRN

## 2018-04-14 MED ORDER — ALBUTEROL SULFATE HFA 108 (90 BASE) MCG/ACT IN AERS: 2.0000 | INHALATION_SPRAY | Freq: Four times a day (QID) | RESPIRATORY_TRACT | 2 refills | Status: AC | PRN

## 2018-04-14 MED ORDER — FLUTICASONE PROPIONATE HFA 220 MCG/ACT IN AERO: 2.0000 | INHALATION_SPRAY | Freq: Two times a day (BID) | RESPIRATORY_TRACT | 12 refills | Status: AC

## 2018-04-14 MED ORDER — AMIODARONE HCL 200 MG PO TABS
100.0000 mg | ORAL_TABLET | Freq: Two times a day (BID) | ORAL | Status: DC
  Administered 2018-04-14: 100 mg via ORAL

## 2018-04-14 MED ORDER — PREDNISONE 10 MG PO TABS: ORAL_TABLET | ORAL | 0 refills | Status: DC

## 2018-04-14 MED ORDER — AMIODARONE HCL 100 MG PO TABS: 100.0000 mg | ORAL_TABLET | Freq: Every day | ORAL | 0 refills | Status: DC

## 2018-04-14 MED ORDER — CEFDINIR 300 MG PO CAPS: 300.0000 mg | ORAL_CAPSULE | Freq: Two times a day (BID) | ORAL | 0 refills | Status: DC

## 2018-04-14 NOTE — Discharge Summary (Signed)
[place] at [place] NAME: Danny Cook    MR#:  [id number]  DATE OF BIRTH:  Jul 08, 1960  DATE OF ADMISSION:  04/12/2018 ADMITTING PHYSICIAN: Vaughan Basta, MD  DATE OF DISCHARGE: 04/14/2018  PRIMARY CARE PHYSICIAN: [facility], Jef[facility] Bryant    ADMISSION DIAGNOSIS:  Pneumonia of both lungs due to infectious organism, unspecified part of lung [J18.9] Sepsis, due to unspecified organism, unspecified whether acute organ dysfunction present ([place]) [A41.9]  DISCHARGE DIAGNOSIS:  Principal Problem:   Community acquired pneumonia Active Problems:   Pneumonia   SECONDARY DIAGNOSIS:   Past Medical History:  Diagnosis Date  . Anemia   . Anxiety   . Arthritis   . CHF (congestive heart failure) ([place])   . Chronic kidney disease    Renal Insufficiency Syndrome; Glomerulosclerosis 2013  . Complication of anesthesia   . Depressed   . Diabetes mellitus without complication ([place])   . High cholesterol   . HIV (human immunodeficiency virus infection) ([place])   . Hypertension   . 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 Paranoid disorder ([place])   . Schizophrenia, paranoid ([place])   . Sleep apnea     HOSPITAL COURSE:   1.  Pneumonia.  Patient feeling much better today.  Breathing a lot more comfortably.  MR SA PCR was negative so vancomycin was discontinued.  Patient was given Maxipime here and will give Cefdinir ear upon discharge. 2.  Pleuritic chest pain.  Started Solu-Medrol and I think this is what made the patient breathe much better.  We will give 3 more days of prednisone 40 mg daily. 3.  Type 2 diabetes mellitus.  Patient was here on sliding scale and Glucophage only.  We held his other medications.  Can go back on Vick toes as outpatient but I would get rid of the [place]. 4.  Chronic kidney disease stage III.  Creatinine actually improved while here. 5.  HIV.  Continue current medications 6.  Hypertension.  Continue usual medications 7.  History  of paroxysmal atrial fibrillation decrease amiodarone dose since heart rate is on the lower side.  I will give 100 mg daily. 8.  Hyperlipidemia unspecified on Crestor 9.  GERD on Protonix 10.  Anemia of chronic disease.  Ferritin is actually high. 11.  Adrenal adenoma seen on prior CAT scan.  Since I started steroids I cannot send off a cortisol.  Would recommend adrenal work-up as outpatient. 12.  I think something is going on underlying here because he responds to steroids every time he is in the hospital.  I do not want to keep the patient on chronic steroids without a diagnosis.  I will try a Flovent inhaler and albuterol inhaler to see if this helps. 13.  Send off a serum protein electrophoresis and a QuantiFERON gold  DISCHARGE CONDITIONS:   Satisfactory  CONSULTS OBTAINED:  Treatment Team:  Tsosie Billing, MD  DRUG ALLERGIES:   Allergies  Allergen Reactions  . Trazodone And Nefazodone   . Viread [Tenofovir Disoproxil]     Damages kidneys  . Etodolac Rash    DISCHARGE MEDICATIONS:   Allergies as of 04/14/2018      Reactions   Trazodone And Nefazodone    Viread [tenofovir Disoproxil]    Damages kidneys   Etodolac Rash      Medication List    STOP taking these medications   insulin degludec 100 UNIT/ML Sopn FlexTouch Pen Commonly known as:  TRESIBA     TAKE these medications  albuterol 108 (90 Base) MCG/ACT inhaler Commonly known as:  PROVENTIL HFA;VENTOLIN HFA Inhale 2 puffs into the lungs every 6 (six) hours as needed for wheezing or shortness of breath.   amiodarone 100 MG tablet Commonly known as:  PACERONE Take 1 tablet (100 mg total) by mouth daily. What changed:    medication strength  how much to take  how to take this  when to take this  additional instructions   aspirin EC 325 MG tablet Take 325 mg by mouth daily.   butalbital-acetaminophen-caffeine 50-325-40 MG tablet Commonly known as:  FIORICET, ESGIC Take 1-2 tablets by  mouth every 6 (six) hours as needed. What changed:  reasons to take this   cefdinir 300 MG capsule Commonly known as:  OMNICEF Take 1 capsule (300 mg total) by mouth 2 (two) times daily.   chlorpheniramine-HYDROcodone 10-8 MG/5ML Suer Commonly known as:  TUSSIONEX Take 5 mLs by mouth every 12 (twelve) hours as needed for cough.   clonazePAM 1 MG tablet Commonly known as:  KLONOPIN Take 1 mg by mouth 2 (two) times daily. Take 1 mg in the morning, 0.5 mg mid-day and 1 mg in the evening.   cyanocobalamin 1000 MCG tablet Take 1,000 mcg by mouth daily.   diphenhydrAMINE 50 MG capsule Commonly known as:  BENADRYL Take 1 capsule (50 mg total) by mouth at bedtime as needed for sleep.   divalproex 250 MG 24 hr tablet Commonly known as:  DEPAKOTE ER Take 500 mg by mouth 2 (two) times daily.   enalapril 2.5 MG tablet Commonly known as:  VASOTEC Take 1 tablet (2.5 mg total) by mouth daily.   FANAPT 12 MG Tabs Generic drug:  Iloperidone Take 12 mg by mouth 2 (two) times daily.   feeding supplement (NEPRO CARB STEADY) Liqd Take 237 mLs by mouth 2 (two) times daily between meals.   fluticasone 220 MCG/ACT inhaler Commonly known as:  FLOVENT HFA Inhale 2 puffs into the lungs 2 (two) times daily. Rinse out mouth afterwards   liraglutide 18 MG/3ML Sopn Commonly known as:  VICTOZA Inject 1.8 mg into the skin See admin instructions. Daily if blood sugar is greater than 85; patient uses tresiba if blood sugar is less than 85   metFORMIN 1000 MG tablet Commonly known as:  GLUCOPHAGE Take 1,000 mg by mouth 2 (two) times daily with a meal.   nitroGLYCERIN 0.4 MG SL tablet Commonly known as:  NITROSTAT Place 0.4 mg under the tongue every 5 (five) minutes as needed for chest pain.   omeprazole 40 MG capsule Commonly known as:  PRILOSEC Take 40 mg by mouth 2 (two) times daily.   predniSONE 10 MG tablet Commonly known as:  DELTASONE 4 tabs po daily for 3 dyays   promethazine 6.25  MG/5ML syrup Commonly known as:  PHENERGAN Take 6.25 mg by mouth every 6 (six) hours as needed for nausea or vomiting.   rosuvastatin 20 MG tablet Commonly known as:  CRESTOR Take 20 mg by mouth daily.   triamcinolone cream 0.1 % Commonly known as:  KENALOG Apply 1 application topically 2 (two) times daily as needed. What changed:  reasons to take this   TRIUMEQ 600-50-300 MG tablet Generic drug:  abacavir-dolutegravir-lamiVUDine Take 1 tablet by mouth daily.   venlafaxine XR 75 MG 24 hr capsule Commonly known as:  EFFEXOR-XR Take 1 capsule (75 mg total) by mouth daily with breakfast.        DISCHARGE INSTRUCTIONS:   Follow-up PMD 5 days  Follow-up infectious disease 2 weeks  If you experience worsening of your admission symptoms, develop shortness of breath, life threatening emergency, suicidal or homicidal thoughts you must seek medical attention immediately by calling 911 or calling your MD immediately  if symptoms less severe.  You Must read complete instructions/literature along with all the possible adverse reactions/side effects for all the Medicines you take and that have been prescribed to you. Take any new Medicines after you have completely understood and accept all the possible adverse reactions/side effects.   Please note  You were cared for by a hospitalist during your hospital stay. If you have any questions about your discharge medications or the care you received while you were in the hospital after you are discharged, you can call the unit and asked to speak with the hospitalist on call if the hospitalist that took care of you is not available. Once you are discharged, your primary care physician will handle any further medical issues. Please note that NO REFILLS for any discharge medications will be authorized once you are discharged, as it is imperative that you return to your primary care physician (or establish a relationship with a primary care physician if  you do not have one) for your aftercare needs so that they can reassess your need for medications and monitor your lab values.    Today   CHIEF COMPLAINT:   Chief Complaint  Patient presents with  . Fever  . Cough    HISTORY OF PRESENT ILLNESS:  Danny Cook  is a 57 y.o. male presented with fever and cough and found to have pneumonia   VITAL SIGNS:  Blood pressure 112/66, pulse (!) 52, temperature (!) 97.5 F (36.4 C), temperature source Oral, resp. rate 16, height 5\' 7"  (1.702 m), weight 93.9 kg, SpO2 98 %.    PHYSICAL EXAMINATION:  GENERAL:  57 y.o.-year-old patient lying in the bed with no acute distress.  EYES: Pupils equal, round, reactive to light and accommodation. No scleral icterus. Extraocular muscles intact.  HEENT: Head atraumatic, normocephalic. Oropharynx and nasopharynx clear.  NECK:  Supple, no jugular venous distention. No thyroid enlargement, no tenderness.  LUNGS: Normal breath sounds bilaterally, no wheezing, rales,rhonchi or crepitation. No use of accessory muscles of respiration.  CARDIOVASCULAR: S1, S2 normal. No murmurs, rubs, or gallops.  ABDOMEN: Soft, non-tender, non-distended. Bowel sounds present. No organomegaly or mass.  EXTREMITIES: No pedal edema, cyanosis, or clubbing.  NEUROLOGIC: Cranial nerves II through XII are intact. Muscle strength 5/5 in all extremities. Sensation intact. Gait not checked.  PSYCHIATRIC: The patient is alert and oriented x 3.  SKIN: No obvious rash, lesion, or ulcer.   DATA REVIEW:   CBC Recent Labs  Lab 04/14/18 0359  WBC 7.0  HGB 8.4*  HCT 26.3*  PLT 169    Chemistries  Recent Labs  Lab 04/12/18 1101  04/14/18 0359  NA 135   < > 137  K 3.9   < > 4.7  CL 99   < > 104  CO2 22   < > 25  GLUCOSE 196*   < > 154*  BUN 21*   < > 24*  CREATININE 1.55*   < > 1.27*  CALCIUM 8.3*   < > 8.7*  AST 28  --   --   ALT 27  --   --   ALKPHOS 80  --   --   BILITOT 0.6  --   --    < > = values in  this  interval not displayed.    Cardiac Enzymes Recent Labs  Lab 04/12/18 1101  TROPONINI <0.03    Microbiology Results  Results for orders placed or performed during the hospital encounter of 04/12/18  Blood Culture (routine x 2)     Status: None (Preliminary result)   Collection Time: 04/12/18 11:01 AM  Result Value Ref Range Status   Specimen Description BLOOD RIGHT HAND  Final   Special Requests   Final    BOTTLES DRAWN AEROBIC AND ANAEROBIC Blood Culture adequate volume   Culture   Final    NO GROWTH 2 DAYS Performed at [facility], [address], [place], [place] [address]    Report Status PENDING  Incomplete  Blood Culture (routine x 2)     Status: None (Preliminary result)   Collection Time: 04/12/18 11:01 AM  Result Value Ref Range Status   Specimen Description BLOOD RIGHT [facility]  Final   Special Requests   Final    BOTTLES DRAWN AEROBIC AND ANAEROBIC Blood Culture adequate volume   Culture   Final    NO GROWTH 2 DAYS Performed at [facility], [address], [place], [place] [address]    Report Status PENDING  Incomplete     Management plans discussed with the patient, and he is in agreement.  CODE STATUS:     Code Status Orders  (From admission, onward)         Start     Ordered   04/12/18 1408  Full code  Continuous     04/12/18 1407        Code Status History    Date Active Date Inactive Code Status Order ID Comments User Context   03/23/2018 2123 03/27/2018 1802 Full Code [id number]  Lance Coon, MD ED   11/04/2017 2050 11/09/2017 1400 Full Code [id number]  Gonzella Lex, MD Inpatient   07/31/2017 0240 08/01/2017 1952 Full Code [id number]  [name], MD ED      TOTAL TIME TAKING CARE OF THIS PATIENT: 35 minutes.    Loletha Grayer M.D on 04/14/2018 at 12:55 PM  Between 7am to 6pm - Pager - [phone]  After 6pm go to [email or web address] - Proofreader  [facility] Office  [phone]  CC: Primary care physician;  Katheren Shams

## 2018-04-14 NOTE — Progress Notes (Signed)
Patient ID: Danny Cook., male   DOB: 26-Oct-1960, 57 y.o.   MRN: [id number]  Infectious disease wanted to get a CT scan of the chest prior to discharge.  Patient was discharged prior to me reviewing the CT scan of the chest.  Patient had a small left pleural effusion with isolated atelectasis they did not comment on pneumonia on the lung fields.  Lung fields were okay.  Patient had a pericardial effusion.  This was seen on the last hospitalization with echocardiogram.  Patient was seen by cardiology at that time and they did not want to do any aggressive intervention at that time.  Close clinical monitoring as outpatient needed.  The patient seems to do well on steroids but then has some worsening symptoms after steroids are stopped.  Once the steroids are stopped we have to see what his symptoms are.  Dr. Loletha Grayer

## 2018-04-14 NOTE — Progress Notes (Signed)
Received order to discharge patient home, reviewed discharge instructions homes meds, prescriptions and follow up appointments with patient and patient verbalized understanding

## 2018-04-14 NOTE — Clinical Social Work Note (Signed)
Patient is medically ready for discharge today back to [facility] group [facility]. [name] contacted [facility] and they state that they will pick up patient this afternoon.   [place], [place]

## 2018-04-15 LAB — PROTEIN ELECTROPHORESIS, SERUM
A/G RATIO SPE: 0.7 (ref 0.7–1.7)
ALPHA-1-GLOBULIN: 0.6 g/dL — AB (ref 0.0–0.4)
ALPHA-2-GLOBULIN: 1.4 g/dL — AB (ref 0.4–1.0)
Albumin ELP: 2.7 g/dL — ABNORMAL LOW (ref 2.9–4.4)
BETA GLOBULIN: 1.3 g/dL (ref 0.7–1.3)
GLOBULIN, TOTAL: 4.1 g/dL — AB (ref 2.2–3.9)
Gamma Globulin: 0.8 g/dL (ref 0.4–1.8)
M-Spike, %: 0.4 g/dL — ABNORMAL HIGH
TOTAL PROTEIN ELP: 6.8 g/dL (ref 6.0–8.5)

## 2018-04-17 LAB — QUANTIFERON-TB GOLD PLUS: QUANTIFERON-TB GOLD PLUS: UNDETERMINED

## 2018-04-17 LAB — CULTURE, BLOOD (ROUTINE X 2)
CULTURE: NO GROWTH
Culture: NO GROWTH
SPECIAL REQUESTS: ADEQUATE
SPECIAL REQUESTS: ADEQUATE

## 2018-04-17 LAB — QUANTIFERON-TB GOLD PLUS (RQFGPL)
QUANTIFERON NIL VALUE: 0.05 [IU]/mL
QUANTIFERON TB1 AG VALUE: 0.07 [IU]/mL
QUANTIFERON TB2 AG VALUE: 0.06 [IU]/mL
QuantiFERON Mitogen Value: 0.14 IU/mL

## 2018-04-24 ENCOUNTER — Emergency Department: Payer: Medicare Other

## 2018-04-24 ENCOUNTER — Emergency Department
Admission: EM | Admit: 2018-04-24 | Discharge: 2018-04-24 | Disposition: A | Discharge status: Home / Self Care | Payer: Medicare Other | Source: Home / Self Care | Attending: Emergency Medicine | Admitting: Emergency Medicine

## 2018-04-24 ENCOUNTER — Encounter: Payer: Self-pay | Admitting: Emergency Medicine

## 2018-04-24 DIAGNOSIS — Z7984 Long term (current) use of oral hypoglycemic drugs: Secondary | ICD-10-CM

## 2018-04-24 DIAGNOSIS — E1122 Type 2 diabetes mellitus with diabetic chronic kidney disease: Secondary | ICD-10-CM | POA: Insufficient documentation

## 2018-04-24 DIAGNOSIS — R059 Cough, unspecified: Secondary | ICD-10-CM

## 2018-04-24 DIAGNOSIS — I130 Hypertensive heart and chronic kidney disease with heart failure and stage 1 through stage 4 chronic kidney disease, or unspecified chronic kidney disease: Secondary | ICD-10-CM

## 2018-04-24 DIAGNOSIS — Z79899 Other long term (current) drug therapy: Secondary | ICD-10-CM | POA: Insufficient documentation

## 2018-04-24 DIAGNOSIS — R0902 Hypoxemia: Secondary | ICD-10-CM | POA: Diagnosis not present

## 2018-04-24 DIAGNOSIS — J159 Unspecified bacterial pneumonia: Secondary | ICD-10-CM | POA: Diagnosis not present

## 2018-04-24 DIAGNOSIS — N189 Chronic kidney disease, unspecified: Secondary | ICD-10-CM

## 2018-04-24 DIAGNOSIS — I5032 Chronic diastolic (congestive) heart failure: Secondary | ICD-10-CM

## 2018-04-24 DIAGNOSIS — Z7982 Long term (current) use of aspirin: Secondary | ICD-10-CM | POA: Insufficient documentation

## 2018-04-24 DIAGNOSIS — R05 Cough: Secondary | ICD-10-CM | POA: Insufficient documentation

## 2018-04-24 LAB — COMPREHENSIVE METABOLIC PANEL
ALK PHOS: 92 U/L (ref 38–126)
ALT: 16 U/L (ref 0–44)
ANION GAP: 14 (ref 5–15)
AST: 12 U/L — ABNORMAL LOW (ref 15–41)
Albumin: 3.3 g/dL — ABNORMAL LOW (ref 3.5–5.0)
BUN: 14 mg/dL (ref 6–20)
CALCIUM: 8.9 mg/dL (ref 8.9–10.3)
CO2: 23 mmol/L (ref 22–32)
Chloride: 103 mmol/L (ref 98–111)
Creatinine, Ser: 1.3 mg/dL — ABNORMAL HIGH (ref 0.61–1.24)
GFR, EST NON AFRICAN AMERICAN: 59 mL/min — AB (ref >60)
Glucose, Bld: 157 mg/dL — ABNORMAL HIGH (ref 70–99)
Potassium: 3.6 mmol/L (ref 3.5–5.1)
SODIUM: 140 mmol/L (ref 135–145)
TOTAL PROTEIN: 7.3 g/dL (ref 6.5–8.1)
Total Bilirubin: 1 mg/dL (ref 0.3–1.2)

## 2018-04-24 LAB — CBC WITH DIFFERENTIAL/PLATELET
Abs Immature Granulocytes: 0.11 10*3/uL — ABNORMAL HIGH (ref 0.00–0.07)
BASOS PCT: 0 %
Basophils Absolute: 0 10*3/uL (ref 0.0–0.1)
EOS PCT: 0 %
Eosinophils Absolute: 0 10*3/uL (ref 0.0–0.5)
HCT: 37.2 % — ABNORMAL LOW (ref 39.0–52.0)
HEMOGLOBIN: 11.7 g/dL — AB (ref 13.0–17.0)
Immature Granulocytes: 1 %
Lymphocytes Relative: 14 %
Lymphs Abs: 1.8 10*3/uL (ref 0.7–4.0)
MCH: 29.3 pg (ref 26.0–34.0)
MCHC: 31.5 g/dL (ref 30.0–36.0)
MCV: 93.2 fL (ref 80.0–100.0)
MONO ABS: 1.5 10*3/uL — AB (ref 0.1–1.0)
Monocytes Relative: 12 %
Neutro Abs: 9.3 10*3/uL — ABNORMAL HIGH (ref 1.7–7.7)
Neutrophils Relative %: 73 %
PLATELETS: 237 10*3/uL (ref 150–400)
RBC: 3.99 MIL/uL — AB (ref 4.22–5.81)
RDW: 15.6 % — ABNORMAL HIGH (ref 11.5–15.5)
WBC: 12.8 10*3/uL — AB (ref 4.0–10.5)
nRBC: 0 % (ref 0.0–0.2)

## 2018-04-24 MED ORDER — BENZONATATE 100 MG PO CAPS: 100.0000 mg | ORAL_CAPSULE | Freq: Four times a day (QID) | ORAL | 0 refills | Status: AC | PRN

## 2018-04-24 MED ORDER — BENZONATATE 100 MG PO CAPS: 100.0000 mg | ORAL_CAPSULE | Freq: Four times a day (QID) | ORAL | 0 refills | Status: DC | PRN

## 2018-04-24 MED ORDER — DOXYCYCLINE HYCLATE 100 MG PO CAPS: 100.0000 mg | ORAL_CAPSULE | Freq: Two times a day (BID) | ORAL | 0 refills | Status: DC

## 2018-04-24 MED ORDER — DOXYCYCLINE HYCLATE 100 MG PO TABS
100.0000 mg | ORAL_TABLET | Freq: Once | ORAL | Status: AC
  Administered 2018-04-24: 100 mg via ORAL
  Filled 2018-04-24: qty 1

## 2018-04-24 NOTE — Discharge Instructions (Signed)
Return to the emergency room for any new or worrisome symptoms including shortness of breath or worsening cough take the antibiotics until they are gone see your doctor without fail first thing Monday morning. you would prefer to go home rather than stay, which is certainly your choice, but if you feel worse or you do need to come back to the hospital.

## 2018-04-24 NOTE — ED Provider Notes (Signed)
[facility] Emergency Department Provider Note  ____________________________________________   I have reviewed the triage vital signs and the nursing notes. Where available I have reviewed prior notes and, if possible and indicated, outside hospital notes.    HISTORY  Chief Complaint Cough    HPI Malikah Lakey. is a 57 y.o. male who states that he has a history of chronic kidney disease, chronic renal insufficiency, anxiety arthritis and CHF, presents today with a cough.  Patient has had a cough since yesterday.  Subjectively felt warm T-max was 99.  Did have pneumonia earlier this month, and went home on antibiotics which are now gone.  Patient does have a history of HIV which is well controlled.  He has been coughing for the last day and a half.  Occasionally productive.  No chest pain no leg swelling.   Past Medical History:  Diagnosis Date  . Anemia   . Anxiety   . Arthritis   . CHF (congestive heart failure) ([place])   . Chronic kidney disease    Renal Insufficiency Syndrome; Glomerulosclerosis 2013  . Complication of anesthesia   . Depressed   . Diabetes mellitus without complication ([place])   . High cholesterol   . HIV (human immunodeficiency virus infection) ([place])   . Hypertension   . 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 Paranoid disorder ([place])   . Schizophrenia, paranoid ([place])   . Sleep apnea     Patient Active Problem List   Diagnosis Date Noted  . Community acquired pneumonia 04/12/2018  . Pneumonia 04/12/2018  . AKI (acute kidney injury) ([place]) 03/23/2018  . Pericarditis with effusion 03/23/2018  . Schizoaffective disorder, depressive type ([place]) 11/04/2017  . HIV positive ([place]) 11/04/2017  . Orthostatic dizziness 07/31/2017  . Chronic diastolic heart failure ([place]) 07/29/2017  . HTN (hypertension) 07/29/2017  . Diabetes ([place]) 07/29/2017  . Schizophrenia ([place]) 07/29/2017    Past Surgical History:  Procedure Laterality  Date  . COLONOSCOPY WITH PROPOFOL N/A 09/03/2015   Procedure: COLONOSCOPY WITH PROPOFOL;  Surgeon: Lollie Sails, MD;  Location: [facility] ENDOSCOPY;  Service: Endoscopy;  Laterality: N/A;  . DG TEETH FULL    . ESOPHAGOGASTRODUODENOSCOPY (EGD) WITH PROPOFOL N/A 07/01/2016   Procedure: ESOPHAGOGASTRODUODENOSCOPY (EGD) WITH PROPOFOL;  Surgeon: Lollie Sails, MD;  Location: [facility] ENDOSCOPY;  Service: Endoscopy;  Laterality: N/A;    Prior to Admission medications   Medication Sig Start Date End Date Taking? Authorizing Provider  Abacavir-Dolutegravir-Lamivud (TRIUMEQ) 600-50-300 MG TABS Take 1 tablet by mouth daily.    [provider]  albuterol (PROVENTIL HFA;VENTOLIN HFA) 108 (90 Base) MCG/ACT inhaler Inhale 2 puffs into the lungs every 6 (six) hours as needed for wheezing or shortness of breath. 04/14/18   Loletha Grayer, MD  amiodarone (PACERONE) 100 MG tablet Take 1 tablet (100 mg total) by mouth daily. 04/14/18   Loletha Grayer, MD  aspirin EC 325 MG tablet Take 325 mg by mouth daily.    [provider]  butalbital-acetaminophen-caffeine (FIORICET, ESGIC) 50-325-40 MG tablet Take 1-2 tablets by mouth every 6 (six) hours as needed. Patient taking differently: Take 1-2 tablets by mouth every 6 (six) hours as needed for migraine.  01/31/18 01/31/19  Earleen Newport, MD  cefdinir (OMNICEF) 300 MG capsule Take 1 capsule (300 mg total) by mouth 2 (two) times daily. 04/14/18   Loletha Grayer, MD  chlorpheniramine-HYDROcodone (TUSSIONEX) 10-8 MG/5ML SUER Take 5 mLs by mouth every 12 (twelve) hours as needed for cough. 04/14/18  Wieting, Richard, MD  clonazePAM (KLONOPIN) 1 MG tablet Take 1 mg by mouth 2 (two) times daily. Take 1 mg in the morning, 0.5 mg mid-day and 1 mg in the evening.    [provider]  cyanocobalamin 1000 MCG tablet Take 1,000 mcg by mouth daily.    [provider]  diphenhydrAMINE (BENADRYL) 50 MG capsule Take 1 capsule (50 mg total) by  mouth at bedtime as needed for sleep. 11/09/17   McNew, Tyson Babinski, MD  divalproex (DEPAKOTE ER) 250 MG 24 hr tablet Take 500 mg by mouth 2 (two) times daily.     [provider]  enalapril (VASOTEC) 2.5 MG tablet Take 1 tablet (2.5 mg total) by mouth daily. 03/28/18   Wieting, Richard, MD  FANAPT 12 MG TABS Take 12 mg by mouth 2 (two) times daily.  11/14/16   [provider]  fluticasone (FLOVENT HFA) 220 MCG/ACT inhaler Inhale 2 puffs into the lungs 2 (two) times daily. Rinse out mouth afterwards 04/14/18   Loletha Grayer, MD  liraglutide (VICTOZA) 18 MG/3ML SOPN Inject 1.8 mg into the skin See admin instructions. Daily if blood sugar is greater than 85; patient uses tresiba if blood sugar is less than 85    [provider]  metFORMIN (GLUCOPHAGE) 1000 MG tablet Take 1,000 mg by mouth 2 (two) times daily with a meal.    [provider]  nitroGLYCERIN (NITROSTAT) 0.4 MG SL tablet Place 0.4 mg under the tongue every 5 (five) minutes as needed for chest pain.     [provider]  Nutritional Supplements (FEEDING SUPPLEMENT, NEPRO CARB STEADY,) LIQD Take 237 mLs by mouth 2 (two) times daily between meals. 03/27/18   Loletha Grayer, MD  omeprazole (PRILOSEC) 40 MG capsule Take 40 mg by mouth 2 (two) times daily.    [provider]  predniSONE (DELTASONE) 10 MG tablet 4 tabs po daily for 3 dyays 04/14/18   Loletha Grayer, MD  promethazine (PHENERGAN) 6.25 MG/5ML syrup Take 6.25 mg by mouth every 6 (six) hours as needed for nausea or vomiting.    [provider]  rosuvastatin (CRESTOR) 20 MG tablet Take 20 mg by mouth daily.    [provider]  triamcinolone cream (KENALOG) 0.1 % Apply 1 application topically 2 (two) times daily as needed. Patient taking differently: Apply 1 application topically 2 (two) times daily as needed (for rash).  11/16/16   Little, Traci M, PA-C  venlafaxine XR (EFFEXOR-XR) 75 MG 24 hr capsule Take 1 capsule (75  mg total) by mouth daily with breakfast. 11/09/17   McNew, Tyson Babinski, MD    Allergies Trazodone and nefazodone; Viread [tenofovir disoproxil]; and Etodolac  Family History  Problem Relation Age of Onset  . Heart attack Mother   . Diabetes Mellitus II Mother   . Mental illness Mother   . CAD Mother   . Heart attack Father   . CAD Father   . Hypertension Father     Social History Social History   Tobacco Use  . Smoking status: Never Smoker  . Smokeless tobacco: Never Used  Substance Use Topics  . Alcohol use: No  . Drug use: No    Review of Systems Constitutional: No fever/chills Eyes: No visual changes. ENT: No sore throat. No stiff neck no neck pain Cardiovascular: Denies chest pain. Respiratory: Denies shortness of breath. Gastrointestinal:   no vomiting.  No diarrhea.  No constipation. Genitourinary: Negative for dysuria. Musculoskeletal: Negative lower extremity swelling Skin: Negative  for rash. Neurological: Negative for severe headaches, focal weakness or numbness.   ____________________________________________   PHYSICAL EXAM:  VITAL SIGNS: ED Triage Vitals  Enc Vitals Group     BP 04/24/18 1422 (!) 102/57     Pulse Rate 04/24/18 1422 92     Resp 04/24/18 1422 12     Temp 04/24/18 1422 98.4 F (36.9 C)     Temp Source 04/24/18 1422 Oral     SpO2 04/24/18 1422 95 %     Weight 04/24/18 1439 207 lb (93.9 kg)     Height 04/24/18 1439 5\' 7"  (1.702 m)     Head Circumference --      Peak Flow --      Pain Score 04/24/18 1439 0     Pain Loc --      Pain Edu? --      Excl. in [place]? --     Constitutional: Alert and oriented. Well appearing and in no acute distress. Eyes: Conjunctivae are normal Head: Atraumatic HEENT: No congestion/rhinnorhea. Mucous membranes are moist.  Oropharynx non-erythematous Neck:   Nontender with no meningismus, no masses, no stridor Cardiovascular: Normal rate, regular rhythm. Grossly normal heart sounds.  Good peripheral  circulation. Respiratory: Normal respiratory effort.  No retractions. Lungs CTAB. Abdominal: Soft and nontender. No distention. No guarding no rebound Back:  There is no focal tenderness or step off.  there is no midline tenderness there are no lesions noted. there is no CVA tenderness Musculoskeletal: No lower extremity tenderness, no upper extremity tenderness. No joint effusions, no DVT signs strong distal pulses no edema Neurologic:  Normal speech and language. No gross focal neurologic deficits are appreciated.  Skin:  Skin is warm, dry and intact. No rash noted. Psychiatric: Mood and affect are normal. Speech and behavior are normal.  ____________________________________________   LABS (all labs ordered are listed, but only abnormal results are displayed)  Labs Reviewed  CBC WITH DIFFERENTIAL/PLATELET - Abnormal; Notable for the following components:      Result Value   WBC 12.8 (*)    RBC 3.99 (*)    Hemoglobin 11.7 (*)    HCT 37.2 (*)    RDW 15.6 (*)    Neutro Abs 9.3 (*)    Monocytes Absolute 1.5 (*)    Abs Immature Granulocytes 0.11 (*)    All other components within normal limits  COMPREHENSIVE METABOLIC PANEL - Abnormal; Notable for the following components:   Glucose, Bld 157 (*)    Creatinine, Ser 1.30 (*)    Albumin 3.3 (*)    AST 12 (*)    GFR calc non Af Amer 59 (*)    All other components within normal limits    Pertinent labs  results that were available during my care of the patient were reviewed by me and considered in my medical decision making (see chart for details). ____________________________________________  EKG  I personally interpreted any EKGs ordered by me or triage  ____________________________________________  RADIOLOGY  Pertinent labs & imaging results that were available during my care of the patient were reviewed by me and considered in my medical decision making (see chart for details). If possible, patient and/or family made aware  of any abnormal findings.  Dg Chest 2 View  Result Date: 04/24/2018 CLINICAL DATA:  Dry cough, fever, and chills since yesterday. Patient also states SOB on exertion. Patient states that he was discharged from hospital 10 days ago where he was admitted with pneumonia. Hx of CHF, diabetes,  Kaposi's sarcoma, HIV. EXAM: CHEST - 2 VIEW COMPARISON:  04/12/2018 and older exams FINDINGS: There is increased opacity at the left base now obscuring the left heart border as well as the hemidiaphragm. Remainder of the lungs is clear. Small to moderate left pleural effusion. No right pleural effusion. No pneumothorax. Cardiac silhouette is normal in size and configuration. No mediastinal or hilar masses. No evidence of adenopathy. Skeletal structures are unremarkable. IMPRESSION: 1. Increased opacity at the left lung base since the prior exams. This is consistent with a combination of pleural fluid with atelectasis and/or pneumonia. 2. No other abnormality.  No pulmonary edema. Electronically Signed   By: Lajean Manes M.D.   On: 04/24/2018 16:01   ____________________________________________    PROCEDURES  Procedure(s) performed: None  Procedures  Critical Care performed: None  ____________________________________________   INITIAL IMPRESSION / ASSESSMENT AND PLAN / ED COURSE  Pertinent labs & imaging results that were available during my care of the patient were reviewed by me and considered in my medical decision making (see chart for details).  Patient with cough and wheeze questionable early pneumonia versus resolution of prior pneumonia, sats are good, no evidence of sepsis, is HIV positive, we will send blood cultures nothing to suggest ACS PE dissection myocarditis enteritis pericarditis pneumothorax, pericardial effusion etc.  Patient strong preference is to be discharged home.  At this time, he does not meet acute criteria for admission however we will give him antibiotics prior to  discharge.     ____________________________________________   FINAL CLINICAL IMPRESSION(S) / ED DIAGNOSES  Final diagnoses:  None      This chart was dictated using voice recognition software.  Despite best efforts to proofread,  errors can occur which can change meaning.      Schuyler Amor, MD 04/24/18 1901

## 2018-04-24 NOTE — ED Notes (Signed)
Reviewed discharge instructions, follow-up care, and prescriptions with patient. Patient verbalized understanding of all information reviewed. Patient stable, with no distress noted at this time.    

## 2018-04-24 NOTE — ED Notes (Signed)
Patient ambulated per MD request. Patient's pulse oximetry level 93%-94% while laying down. Patient's pulse oximetry level increased to 99-100% when upright and ambulated. Patient's heart rate and respiratory rates remained stable, 70-80. And 15-20 respectively. MD McShane informed.

## 2018-04-24 NOTE — ED Triage Notes (Signed)
Pt arrives from group home with complaints of dry cough, fever, and chills since yesterday. Pt denies pain but reports exertional shortness of breath.

## 2018-04-25 ENCOUNTER — Emergency Department: Payer: Medicare Other

## 2018-04-25 ENCOUNTER — Encounter: Payer: Self-pay | Admitting: Internal Medicine

## 2018-04-25 ENCOUNTER — Other Ambulatory Visit: Payer: Self-pay

## 2018-04-25 ENCOUNTER — Inpatient Hospital Stay
Admission: EM | Admit: 2018-04-25 | Discharge: 2018-05-03 | DRG: 194 | Disposition: A | Discharge status: Home / Self Care | Payer: Medicare Other | Attending: Internal Medicine | Admitting: Internal Medicine

## 2018-04-25 DIAGNOSIS — E785 Hyperlipidemia, unspecified: Secondary | ICD-10-CM | POA: Diagnosis present

## 2018-04-25 DIAGNOSIS — F70 Mild intellectual disabilities: Secondary | ICD-10-CM | POA: Diagnosis present

## 2018-04-25 DIAGNOSIS — E1122 Type 2 diabetes mellitus with diabetic chronic kidney disease: Secondary | ICD-10-CM | POA: Diagnosis present

## 2018-04-25 DIAGNOSIS — J90 Pleural effusion, not elsewhere classified: Secondary | ICD-10-CM

## 2018-04-25 DIAGNOSIS — E7800 Pure hypercholesterolemia, unspecified: Secondary | ICD-10-CM | POA: Diagnosis present

## 2018-04-25 DIAGNOSIS — E46 Unspecified protein-calorie malnutrition: Secondary | ICD-10-CM | POA: Diagnosis present

## 2018-04-25 DIAGNOSIS — Z79899 Other long term (current) drug therapy: Secondary | ICD-10-CM

## 2018-04-25 DIAGNOSIS — G4730 Sleep apnea, unspecified: Secondary | ICD-10-CM | POA: Diagnosis present

## 2018-04-25 DIAGNOSIS — I313 Pericardial effusion (noninflammatory): Secondary | ICD-10-CM | POA: Diagnosis present

## 2018-04-25 DIAGNOSIS — N051 Unspecified nephritic syndrome with focal and segmental glomerular lesions: Secondary | ICD-10-CM | POA: Diagnosis present

## 2018-04-25 DIAGNOSIS — I130 Hypertensive heart and chronic kidney disease with heart failure and stage 1 through stage 4 chronic kidney disease, or unspecified chronic kidney disease: Secondary | ICD-10-CM | POA: Diagnosis present

## 2018-04-25 DIAGNOSIS — R0602 Shortness of breath: Secondary | ICD-10-CM | POA: Diagnosis not present

## 2018-04-25 DIAGNOSIS — D631 Anemia in chronic kidney disease: Secondary | ICD-10-CM | POA: Diagnosis present

## 2018-04-25 DIAGNOSIS — E1165 Type 2 diabetes mellitus with hyperglycemia: Secondary | ICD-10-CM | POA: Diagnosis present

## 2018-04-25 DIAGNOSIS — R509 Fever, unspecified: Secondary | ICD-10-CM

## 2018-04-25 DIAGNOSIS — A419 Sepsis, unspecified organism: Secondary | ICD-10-CM

## 2018-04-25 DIAGNOSIS — Z7984 Long term (current) use of oral hypoglycemic drugs: Secondary | ICD-10-CM

## 2018-04-25 DIAGNOSIS — J189 Pneumonia, unspecified organism: Secondary | ICD-10-CM

## 2018-04-25 DIAGNOSIS — Z21 Asymptomatic human immunodeficiency virus [HIV] infection status: Secondary | ICD-10-CM | POA: Diagnosis present

## 2018-04-25 DIAGNOSIS — Z8589 Personal history of malignant neoplasm of other organs and systems: Secondary | ICD-10-CM

## 2018-04-25 DIAGNOSIS — J9811 Atelectasis: Secondary | ICD-10-CM | POA: Diagnosis present

## 2018-04-25 DIAGNOSIS — R51 Headache: Secondary | ICD-10-CM | POA: Diagnosis not present

## 2018-04-25 DIAGNOSIS — I712 Thoracic aortic aneurysm, without rupture: Secondary | ICD-10-CM | POA: Diagnosis present

## 2018-04-25 DIAGNOSIS — Z888 Allergy status to other drugs, medicaments and biological substances status: Secondary | ICD-10-CM

## 2018-04-25 DIAGNOSIS — Z7982 Long term (current) use of aspirin: Secondary | ICD-10-CM

## 2018-04-25 DIAGNOSIS — I714 Abdominal aortic aneurysm, without rupture: Secondary | ICD-10-CM | POA: Diagnosis present

## 2018-04-25 DIAGNOSIS — G43909 Migraine, unspecified, not intractable, without status migrainosus: Secondary | ICD-10-CM | POA: Diagnosis present

## 2018-04-25 DIAGNOSIS — N184 Chronic kidney disease, stage 4 (severe): Secondary | ICD-10-CM | POA: Diagnosis present

## 2018-04-25 DIAGNOSIS — E876 Hypokalemia: Secondary | ICD-10-CM | POA: Diagnosis present

## 2018-04-25 DIAGNOSIS — D3500 Benign neoplasm of unspecified adrenal gland: Secondary | ICD-10-CM | POA: Diagnosis present

## 2018-04-25 DIAGNOSIS — Z8249 Family history of ischemic heart disease and other diseases of the circulatory system: Secondary | ICD-10-CM

## 2018-04-25 DIAGNOSIS — M1990 Unspecified osteoarthritis, unspecified site: Secondary | ICD-10-CM | POA: Diagnosis present

## 2018-04-25 DIAGNOSIS — I5030 Unspecified diastolic (congestive) heart failure: Secondary | ICD-10-CM | POA: Diagnosis present

## 2018-04-25 DIAGNOSIS — Z833 Family history of diabetes mellitus: Secondary | ICD-10-CM

## 2018-04-25 DIAGNOSIS — K219 Gastro-esophageal reflux disease without esophagitis: Secondary | ICD-10-CM | POA: Diagnosis present

## 2018-04-25 DIAGNOSIS — I495 Sick sinus syndrome: Secondary | ICD-10-CM | POA: Diagnosis present

## 2018-04-25 DIAGNOSIS — R05 Cough: Secondary | ICD-10-CM | POA: Diagnosis not present

## 2018-04-25 DIAGNOSIS — D869 Sarcoidosis, unspecified: Secondary | ICD-10-CM | POA: Diagnosis present

## 2018-04-25 DIAGNOSIS — F200 Paranoid schizophrenia: Secondary | ICD-10-CM | POA: Diagnosis present

## 2018-04-25 DIAGNOSIS — R600 Localized edema: Secondary | ICD-10-CM

## 2018-04-25 DIAGNOSIS — Y95 Nosocomial condition: Secondary | ICD-10-CM | POA: Diagnosis present

## 2018-04-25 DIAGNOSIS — R0902 Hypoxemia: Secondary | ICD-10-CM

## 2018-04-25 DIAGNOSIS — N269 Renal sclerosis, unspecified: Secondary | ICD-10-CM | POA: Diagnosis present

## 2018-04-25 DIAGNOSIS — J159 Unspecified bacterial pneumonia: Secondary | ICD-10-CM | POA: Diagnosis present

## 2018-04-25 DIAGNOSIS — F319 Bipolar disorder, unspecified: Secondary | ICD-10-CM | POA: Diagnosis present

## 2018-04-25 DIAGNOSIS — D472 Monoclonal gammopathy: Secondary | ICD-10-CM | POA: Diagnosis not present

## 2018-04-25 DIAGNOSIS — Z794 Long term (current) use of insulin: Secondary | ICD-10-CM

## 2018-04-25 DIAGNOSIS — I480 Paroxysmal atrial fibrillation: Secondary | ICD-10-CM | POA: Diagnosis present

## 2018-04-25 DIAGNOSIS — I959 Hypotension, unspecified: Secondary | ICD-10-CM | POA: Diagnosis present

## 2018-04-25 DIAGNOSIS — Z9889 Other specified postprocedural states: Secondary | ICD-10-CM

## 2018-04-25 DIAGNOSIS — Z818 Family history of other mental and behavioral disorders: Secondary | ICD-10-CM

## 2018-04-25 LAB — GLUCOSE, CAPILLARY
GLUCOSE-CAPILLARY: 164 mg/dL — AB (ref 70–99)
Glucose-Capillary: 177 mg/dL — ABNORMAL HIGH (ref 70–99)
Glucose-Capillary: 115 mg/dL — ABNORMAL HIGH (ref 70–99)

## 2018-04-25 LAB — CBC WITH DIFFERENTIAL/PLATELET
Abs Immature Granulocytes: 0.11 10*3/uL — ABNORMAL HIGH (ref 0.00–0.07)
BASOS PCT: 0 %
Basophils Absolute: 0 10*3/uL (ref 0.0–0.1)
EOS PCT: 0 %
Eosinophils Absolute: 0 10*3/uL (ref 0.0–0.5)
HCT: 35.4 % — ABNORMAL LOW (ref 39.0–52.0)
HEMOGLOBIN: 11.4 g/dL — AB (ref 13.0–17.0)
Immature Granulocytes: 1 %
LYMPHS PCT: 9 %
Lymphs Abs: 1.1 10*3/uL (ref 0.7–4.0)
MCH: 29.7 pg (ref 26.0–34.0)
MCHC: 32.2 g/dL (ref 30.0–36.0)
MCV: 92.2 fL (ref 80.0–100.0)
Monocytes Absolute: 1.1 10*3/uL — ABNORMAL HIGH (ref 0.1–1.0)
Monocytes Relative: 9 %
Neutro Abs: 10.2 10*3/uL — ABNORMAL HIGH (ref 1.7–7.7)
Neutrophils Relative %: 81 %
Platelets: 243 10*3/uL (ref 150–400)
RBC: 3.84 MIL/uL — AB (ref 4.22–5.81)
RDW: 15.7 % — ABNORMAL HIGH (ref 11.5–15.5)
WBC: 12.6 10*3/uL — AB (ref 4.0–10.5)
nRBC: 0 % (ref 0.0–0.2)

## 2018-04-25 LAB — VALPROIC ACID LEVEL: Valproic Acid Lvl: 29 ug/mL — ABNORMAL LOW (ref 50.0–100.0)

## 2018-04-25 LAB — COMPREHENSIVE METABOLIC PANEL
ALT: 16 U/L (ref 0–44)
AST: 13 U/L — AB (ref 15–41)
Albumin: 3.3 g/dL — ABNORMAL LOW (ref 3.5–5.0)
Alkaline Phosphatase: 85 U/L (ref 38–126)
Anion gap: 11 (ref 5–15)
BILIRUBIN TOTAL: 0.8 mg/dL (ref 0.3–1.2)
BUN: 17 mg/dL (ref 6–20)
CO2: 25 mmol/L (ref 22–32)
CREATININE: 1.41 mg/dL — AB (ref 0.61–1.24)
Calcium: 8.9 mg/dL (ref 8.9–10.3)
Chloride: 102 mmol/L (ref 98–111)
GFR calc Af Amer: >60 mL/min (ref >60)
GFR, EST NON AFRICAN AMERICAN: 53 mL/min — AB (ref >60)
Glucose, Bld: 214 mg/dL — ABNORMAL HIGH (ref 70–99)
Potassium: 3.7 mmol/L (ref 3.5–5.1)
Sodium: 138 mmol/L (ref 135–145)
TOTAL PROTEIN: 7.1 g/dL (ref 6.5–8.1)

## 2018-04-25 LAB — URINALYSIS, ROUTINE W REFLEX MICROSCOPIC
Bacteria, UA: NONE SEEN
Bilirubin Urine: NEGATIVE
Glucose, UA: NEGATIVE mg/dL
Ketones, ur: NEGATIVE mg/dL
Leukocytes, UA: NEGATIVE
Nitrite: NEGATIVE
PROTEIN: 100 mg/dL — AB
SPECIFIC GRAVITY, URINE: 1.012 (ref 1.005–1.030)
pH: 5 (ref 5.0–8.0)

## 2018-04-25 LAB — PHOSPHORUS: PHOSPHORUS: 1.8 mg/dL — AB (ref 2.5–4.6)

## 2018-04-25 LAB — MAGNESIUM: MAGNESIUM: 0.8 mg/dL — AB (ref 1.7–2.4)

## 2018-04-25 LAB — TROPONIN I: TROPONIN I: 0.05 ng/mL — AB (ref <0.03)

## 2018-04-25 LAB — LACTIC ACID, PLASMA: LACTIC ACID, VENOUS: 1.9 mmol/L (ref 0.5–1.9)

## 2018-04-25 MED ORDER — ASPIRIN EC 325 MG PO TBEC
DELAYED_RELEASE_TABLET | ORAL | Status: AC
  Administered 2018-04-25: 325 mg via ORAL
  Filled 2018-04-25: qty 1

## 2018-04-25 MED ORDER — ACETAMINOPHEN 325 MG PO TABS
650.0000 mg | ORAL_TABLET | Freq: Once | ORAL | Status: AC
  Administered 2018-04-25: 650 mg via ORAL

## 2018-04-25 MED ORDER — CLONAZEPAM 0.5 MG PO TABS
ORAL_TABLET | ORAL | Status: AC
  Filled 2018-04-25: qty 2

## 2018-04-25 MED ORDER — ACETAMINOPHEN 325 MG PO TABS
650.0000 mg | ORAL_TABLET | Freq: Four times a day (QID) | ORAL | Status: DC | PRN
  Administered 2018-04-25 – 2018-04-29 (×8): 650 mg via ORAL
  Filled 2018-04-25 (×9): qty 2

## 2018-04-25 MED ORDER — SENNOSIDES-DOCUSATE SODIUM 8.6-50 MG PO TABS: 1.0000 | ORAL_TABLET | Freq: Every evening | ORAL | Status: DC | PRN

## 2018-04-25 MED ORDER — DIVALPROEX SODIUM ER 500 MG PO TB24
500.0000 mg | ORAL_TABLET | Freq: Two times a day (BID) | ORAL | Status: DC
  Administered 2018-04-25 – 2018-05-03 (×17): 500 mg via ORAL
  Filled 2018-04-25 (×8): qty 1
  Filled 2018-04-25: qty 2
  Filled 2018-04-25 (×8): qty 1

## 2018-04-25 MED ORDER — BENZONATATE 100 MG PO CAPS
100.0000 mg | ORAL_CAPSULE | Freq: Four times a day (QID) | ORAL | Status: DC | PRN
  Administered 2018-04-26 – 2018-05-02 (×3): 100 mg via ORAL
  Filled 2018-04-25 (×3): qty 1

## 2018-04-25 MED ORDER — MAGNESIUM SULFATE 2 GM/50ML IV SOLN: 2.0000 g | Freq: Once | INTRAVENOUS | Status: DC

## 2018-04-25 MED ORDER — ASPIRIN EC 325 MG PO TBEC
325.0000 mg | DELAYED_RELEASE_TABLET | Freq: Every day | ORAL | Status: DC
  Administered 2018-04-25 – 2018-05-03 (×9): 325 mg via ORAL
  Filled 2018-04-25 (×8): qty 1

## 2018-04-25 MED ORDER — AMIODARONE HCL 200 MG PO TABS
100.0000 mg | ORAL_TABLET | Freq: Every day | ORAL | Status: DC
  Administered 2018-04-25 – 2018-04-27 (×3): 100 mg via ORAL
  Filled 2018-04-25 (×3): qty 1

## 2018-04-25 MED ORDER — DIVALPROEX SODIUM ER 250 MG PO TB24
ORAL_TABLET | ORAL | Status: AC
  Filled 2018-04-25: qty 2

## 2018-04-25 MED ORDER — FLUTICASONE PROPIONATE HFA 220 MCG/ACT IN AERO: 2.0000 | INHALATION_SPRAY | Freq: Two times a day (BID) | RESPIRATORY_TRACT | Status: DC

## 2018-04-25 MED ORDER — SODIUM CHLORIDE 0.9 % IV SOLN
2.0000 g | INTRAVENOUS | Status: DC
  Administered 2018-04-25: 2 g via INTRAVENOUS
  Filled 2018-04-25: qty 20

## 2018-04-25 MED ORDER — CLONAZEPAM 1 MG PO TABS
1.0000 mg | ORAL_TABLET | Freq: Two times a day (BID) | ORAL | Status: DC
  Administered 2018-04-25 – 2018-04-26 (×3): 1 mg via ORAL
  Filled 2018-04-25: qty 1
  Filled 2018-04-25: qty 2

## 2018-04-25 MED ORDER — NEPRO/CARBSTEADY PO LIQD
237.0000 mL | Freq: Two times a day (BID) | ORAL | Status: DC
  Administered 2018-04-30 – 2018-05-03 (×5): 237 mL via ORAL

## 2018-04-25 MED ORDER — ENOXAPARIN SODIUM 40 MG/0.4ML ~~LOC~~ SOLN
40.0000 mg | SUBCUTANEOUS | Status: DC
  Administered 2018-04-25 – 2018-04-29 (×4): 40 mg via SUBCUTANEOUS
  Filled 2018-04-25 (×4): qty 0.4

## 2018-04-25 MED ORDER — PANTOPRAZOLE SODIUM 40 MG PO TBEC
40.0000 mg | DELAYED_RELEASE_TABLET | Freq: Every day | ORAL | Status: DC
  Administered 2018-04-25 – 2018-04-26 (×2): 40 mg via ORAL
  Filled 2018-04-25 (×2): qty 1

## 2018-04-25 MED ORDER — ENALAPRIL MALEATE 2.5 MG PO TABS
2.5000 mg | ORAL_TABLET | Freq: Every day | ORAL | Status: DC
  Administered 2018-04-25 – 2018-04-26 (×2): 2.5 mg via ORAL
  Filled 2018-04-25 (×2): qty 1

## 2018-04-25 MED ORDER — VITAMIN B-12 1000 MCG PO TABS
1000.0000 ug | ORAL_TABLET | Freq: Every day | ORAL | Status: DC
  Administered 2018-04-25 – 2018-05-03 (×9): 1000 ug via ORAL
  Filled 2018-04-25 (×9): qty 1

## 2018-04-25 MED ORDER — PIPERACILLIN-TAZOBACTAM 3.375 G IVPB 30 MIN: 3.3750 g | Freq: Three times a day (TID) | INTRAVENOUS | Status: DC

## 2018-04-25 MED ORDER — VANCOMYCIN HCL 10 G IV SOLR
1250.0000 mg | Freq: Two times a day (BID) | INTRAVENOUS | Status: DC
  Administered 2018-04-25 – 2018-04-26 (×3): 1250 mg via INTRAVENOUS
  Filled 2018-04-25 (×5): qty 1250

## 2018-04-25 MED ORDER — BUTALBITAL-APAP-CAFFEINE 50-325-40 MG PO TABS
1.0000 | ORAL_TABLET | Freq: Four times a day (QID) | ORAL | Status: DC | PRN
  Administered 2018-04-26: 2 via ORAL
  Filled 2018-04-25 (×2): qty 2

## 2018-04-25 MED ORDER — SODIUM CHLORIDE 0.9 % IV SOLN
500.0000 mg | INTRAVENOUS | Status: DC
  Administered 2018-04-25: 500 mg via INTRAVENOUS
  Filled 2018-04-25: qty 500

## 2018-04-25 MED ORDER — POTASSIUM PHOSPHATES 15 MMOLE/5ML IV SOLN
30.0000 mmol | Freq: Once | INTRAVENOUS | Status: AC
  Administered 2018-04-25: 30 mmol via INTRAVENOUS
  Filled 2018-04-25: qty 10

## 2018-04-25 MED ORDER — INSULIN ASPART 100 UNIT/ML ~~LOC~~ SOLN
0.0000 [IU] | Freq: Every day | SUBCUTANEOUS | Status: DC
  Administered 2018-04-26 – 2018-04-30 (×2): 2 [IU] via SUBCUTANEOUS
  Administered 2018-05-01 – 2018-05-02 (×2): 3 [IU] via SUBCUTANEOUS
  Filled 2018-04-25 (×4): qty 1

## 2018-04-25 MED ORDER — PIPERACILLIN-TAZOBACTAM 3.375 G IVPB
3.3750 g | Freq: Three times a day (TID) | INTRAVENOUS | Status: DC
  Administered 2018-04-25 (×2): 3.375 g via INTRAVENOUS
  Filled 2018-04-25 (×3): qty 50

## 2018-04-25 MED ORDER — ONDANSETRON HCL 4 MG/2ML IJ SOLN: 4.0000 mg | Freq: Four times a day (QID) | INTRAMUSCULAR | Status: DC | PRN

## 2018-04-25 MED ORDER — PANTOPRAZOLE SODIUM 40 MG PO TBEC
DELAYED_RELEASE_TABLET | ORAL | Status: AC
  Filled 2018-04-25: qty 1

## 2018-04-25 MED ORDER — BUDESONIDE 0.5 MG/2ML IN SUSP
1.0000 mg | Freq: Two times a day (BID) | RESPIRATORY_TRACT | Status: DC
  Administered 2018-04-25 – 2018-04-26 (×3): 1 mg via RESPIRATORY_TRACT
  Administered 2018-04-27: 0.5 mg via RESPIRATORY_TRACT
  Administered 2018-04-27 – 2018-05-03 (×13): 1 mg via RESPIRATORY_TRACT
  Filled 2018-04-25 (×21): qty 4

## 2018-04-25 MED ORDER — NITROGLYCERIN 0.4 MG SL SUBL: 0.4000 mg | SUBLINGUAL_TABLET | SUBLINGUAL | Status: DC | PRN

## 2018-04-25 MED ORDER — ROSUVASTATIN CALCIUM 10 MG PO TABS
20.0000 mg | ORAL_TABLET | Freq: Every day | ORAL | Status: DC
  Administered 2018-04-25 – 2018-04-26 (×2): 20 mg via ORAL
  Filled 2018-04-25: qty 2
  Filled 2018-04-25: qty 1

## 2018-04-25 MED ORDER — DIPHENHYDRAMINE HCL 25 MG PO CAPS
50.0000 mg | ORAL_CAPSULE | Freq: Every evening | ORAL | Status: DC | PRN
  Administered 2018-04-27 – 2018-04-29 (×4): 50 mg via ORAL
  Filled 2018-04-25 (×4): qty 2

## 2018-04-25 MED ORDER — VENLAFAXINE HCL ER 75 MG PO CP24
75.0000 mg | ORAL_CAPSULE | Freq: Every day | ORAL | Status: DC
  Administered 2018-04-26 – 2018-05-03 (×7): 75 mg via ORAL
  Filled 2018-04-25 (×9): qty 1

## 2018-04-25 MED ORDER — ASPIRIN 81 MG PO CHEW: 81.0000 mg | CHEWABLE_TABLET | Freq: Every day | ORAL | Status: DC

## 2018-04-25 MED ORDER — SODIUM CHLORIDE 0.9 % IV BOLUS (SEPSIS)
1000.0000 mL | Freq: Once | INTRAVENOUS | Status: AC
  Administered 2018-04-25: 1000 mL via INTRAVENOUS

## 2018-04-25 MED ORDER — ILOPERIDONE 4 MG PO TABS
12.0000 mg | ORAL_TABLET | Freq: Two times a day (BID) | ORAL | Status: DC
  Administered 2018-04-25 – 2018-05-03 (×16): 12 mg via ORAL
  Filled 2018-04-25 (×18): qty 3

## 2018-04-25 MED ORDER — MAGNESIUM SULFATE 2 GM/50ML IV SOLN
2.0000 g | INTRAVENOUS | Status: AC
  Administered 2018-04-25 (×3): 2 g via INTRAVENOUS
  Filled 2018-04-25 (×3): qty 50

## 2018-04-25 MED ORDER — HYDROCOD POLST-CPM POLST ER 10-8 MG/5ML PO SUER
5.0000 mL | Freq: Two times a day (BID) | ORAL | Status: DC | PRN
  Administered 2018-04-26 – 2018-04-30 (×5): 5 mL via ORAL
  Filled 2018-04-25 (×5): qty 5

## 2018-04-25 MED ORDER — ACETAMINOPHEN 325 MG PO TABS
ORAL_TABLET | ORAL | Status: AC
  Administered 2018-04-25: 650 mg via ORAL
  Filled 2018-04-25: qty 2

## 2018-04-25 MED ORDER — BISACODYL 5 MG PO TBEC
5.0000 mg | DELAYED_RELEASE_TABLET | Freq: Every day | ORAL | Status: DC | PRN
  Filled 2018-04-25: qty 1

## 2018-04-25 MED ORDER — ABACAVIR-DOLUTEGRAVIR-LAMIVUD 600-50-300 MG PO TABS
1.0000 | ORAL_TABLET | Freq: Every day | ORAL | Status: DC
  Administered 2018-04-25 – 2018-04-28 (×4): 1 via ORAL
  Filled 2018-04-25 (×5): qty 1

## 2018-04-25 MED ORDER — IPRATROPIUM-ALBUTEROL 0.5-2.5 (3) MG/3ML IN SOLN
3.0000 mL | RESPIRATORY_TRACT | Status: DC | PRN
  Administered 2018-04-26 – 2018-04-27 (×2): 3 mL via RESPIRATORY_TRACT
  Filled 2018-04-25 (×2): qty 3

## 2018-04-25 MED ORDER — INSULIN ASPART 100 UNIT/ML ~~LOC~~ SOLN
0.0000 [IU] | Freq: Three times a day (TID) | SUBCUTANEOUS | Status: DC
  Administered 2018-04-25 – 2018-04-26 (×3): 3 [IU] via SUBCUTANEOUS
  Administered 2018-04-26: 5 [IU] via SUBCUTANEOUS
  Administered 2018-04-26 – 2018-04-27 (×3): 3 [IU] via SUBCUTANEOUS
  Administered 2018-04-27: 2 [IU] via SUBCUTANEOUS
  Administered 2018-04-28: 3 [IU] via SUBCUTANEOUS
  Administered 2018-04-28: 2 [IU] via SUBCUTANEOUS
  Administered 2018-04-29 – 2018-04-30 (×3): 5 [IU] via SUBCUTANEOUS
  Administered 2018-04-30 – 2018-05-01 (×2): 3 [IU] via SUBCUTANEOUS
  Administered 2018-05-01 (×2): 8 [IU] via SUBCUTANEOUS
  Administered 2018-05-02 (×2): 5 [IU] via SUBCUTANEOUS
  Administered 2018-05-02: 8 [IU] via SUBCUTANEOUS
  Administered 2018-05-03 (×2): 11 [IU] via SUBCUTANEOUS
  Administered 2018-05-03: 8 [IU] via SUBCUTANEOUS
  Filled 2018-04-25 (×23): qty 1

## 2018-04-25 NOTE — Progress Notes (Signed)
[facility] NAME: Danny Cook    MR#:  [id number]  DATE OF BIRTH:  1960-09-18  SUBJECTIVE:  CHIEF COMPLAINT:   Chief Complaint  Patient presents with  . Cough   Came with cough and fever.  REVIEW OF SYSTEMS:  CONSTITUTIONAL: No fever, fatigue or weakness.  EYES: No blurred or double vision.  EARS, NOSE, AND THROAT: No tinnitus or ear pain.  RESPIRATORY: Have cough, shortness of breath, no wheezing or hemoptysis.  CARDIOVASCULAR: No chest pain, orthopnea, edema.  GASTROINTESTINAL: No nausea, vomiting, diarrhea or abdominal pain.  GENITOURINARY: No dysuria, hematuria.  ENDOCRINE: No polyuria, nocturia,  HEMATOLOGY: No anemia, easy bruising or bleeding SKIN: No rash or lesion. MUSCULOSKELETAL: No joint pain or arthritis.   NEUROLOGIC: No tingling, numbness, weakness.  PSYCHIATRY: No anxiety or depression.   ROS  DRUG ALLERGIES:   Allergies  Allergen Reactions  . Trazodone And Nefazodone   . Viread [Tenofovir Disoproxil]     Damages kidneys  . Etodolac Rash    VITALS:  Blood pressure 102/65, pulse 86, temperature 100.3 F (37.9 C), temperature source Oral, resp. rate 18, SpO2 92 %.  PHYSICAL EXAMINATION:  GENERAL:  57 y.o.-year-old patient lying in the bed with no acute distress.  EYES: Pupils equal, round, reactive to light and accommodation. No scleral icterus. Extraocular muscles intact.  HEENT: Head atraumatic, normocephalic. Oropharynx and nasopharynx clear.  NECK:  Supple, no jugular venous distention. No thyroid enlargement, no tenderness.  LUNGS: Normal breath sounds bilaterally, no wheezing, some rales,rhonchi or crepitation. No use of accessory muscles of respiration.  CARDIOVASCULAR: S1, S2 normal. No murmurs, rubs, or gallops.  ABDOMEN: Soft, nontender, nondistended. Bowel sounds present. No organomegaly or mass.  EXTREMITIES: No pedal edema, cyanosis, or clubbing.  NEUROLOGIC: Cranial nerves II  through XII are intact. Muscle strength 5/5 in all extremities. Sensation intact. Gait not checked.  PSYCHIATRIC: The patient is alert and oriented x 3.  SKIN: No obvious rash, lesion, or ulcer.   Physical Exam LABORATORY PANEL:   CBC Recent Labs  Lab 04/25/18 0316  WBC 12.6*  HGB 11.4*  HCT 35.4*  PLT 243   ------------------------------------------------------------------------------------------------------------------  Chemistries  Recent Labs  Lab 04/25/18 0316 04/25/18 0548  NA 138  --   K 3.7  --   CL 102  --   CO2 25  --   GLUCOSE 214*  --   BUN 17  --   CREATININE 1.41*  --   CALCIUM 8.9  --   MG  --  0.8*  AST 13*  --   ALT 16  --   ALKPHOS 85  --   BILITOT 0.8  --    ------------------------------------------------------------------------------------------------------------------  Cardiac Enzymes Recent Labs  Lab 04/25/18 0316  TROPONINI 0.05*   ------------------------------------------------------------------------------------------------------------------  RADIOLOGY:  Dg Chest 2 View  Result Date: 04/24/2018 CLINICAL DATA:  Dry cough, fever, and chills since yesterday. Patient also states SOB on exertion. Patient states that he was discharged from hospital 10 days ago where he was admitted with pneumonia. Hx of CHF, diabetes, Kaposi's sarcoma, HIV. EXAM: CHEST - 2 VIEW COMPARISON:  04/12/2018 and older exams FINDINGS: There is increased opacity at the left base now obscuring the left heart border as well as the hemidiaphragm. Remainder of the lungs is clear. Small to moderate left pleural effusion. No right pleural effusion. No pneumothorax. Cardiac silhouette is normal in size and configuration. No mediastinal or hilar masses. No evidence of adenopathy.  Skeletal structures are unremarkable. IMPRESSION: 1. Increased opacity at the left lung base since the prior exams. This is consistent with a combination of pleural fluid with atelectasis and/or  pneumonia. 2. No other abnormality.  No pulmonary edema. Electronically Signed   By: Lajean Manes M.D.   On: 04/24/2018 16:01   Dg Chest Port 1 View  Result Date: 04/25/2018 CLINICAL DATA:  58 y/o  M; cough and shortness of breath. EXAM: PORTABLE CHEST 1 VIEW COMPARISON:  04/24/2018 chest radiograph. FINDINGS: Normal cardiac silhouette. Stable small to moderate left pleural effusion and basilar opacity. No new consolidation. No pneumothorax. No acute osseous abnormality is evident. IMPRESSION: Stable small to moderate left pleural effusion and basilar opacity which may represent associated atelectasis or pneumonia. Electronically Signed   By: Kristine Garbe M.D.   On: 04/25/2018 03:59    ASSESSMENT AND PLAN:   Active Problems:   Healthcare associated bacterial pneumonia  *Healthcare acquired pneumonia-sepsis Broad-spectrum antibiotics.  Cultures are sent. Also ordered Legionella by admitting physician.  *Diabetes mellitus with hyperglycemia Keep on sliding scale coverage.  *Chronic kidney disease stage III Renal function appears at baseline, continue to monitor.  Avoid nephrotoxic.  *Hypokalemia Hypomagnesemia-replace IV Replace as needed.  *Hypertension Continue home medications.    All the records are reviewed and case discussed with Care Management/Social Workerr. Management plans discussed with the patient, family and they are in agreement.  CODE STATUS: Full code  TOTAL TIME TAKING CARE OF THIS PATIENT: 35 minutes.     POSSIBLE D/C IN 1-2 DAYS, DEPENDING ON CLINICAL CONDITION.   Vaughan Basta M.D on 04/25/2018   Between 7am to 6pm - Pager - [phone]  After 6pm go to [email or web address] - password [email or web address] [place] [facility]  Office  [phone]  CC: Primary care physician; Katheren Shams  Note: This dictation was prepared with Dragon dictation along with smaller phrase technology. Any transcriptional errors that result  from this process are unintentional.

## 2018-04-25 NOTE — ED Notes (Addendum)
Pt given breakfast tray at this time. Sat pt up to eat.   Paging admitting MD about pt magnesium level.

## 2018-04-25 NOTE — ED Notes (Signed)
Pt using phone to call family member.

## 2018-04-25 NOTE — H&P (Signed)
[facility] NAME: Danny Cook    MR#:  [id number]  DATE OF BIRTH:  [birth year]  DATE OF ADMISSION:  04/25/2018  PRIMARY CARE PHYSICIAN: Katheren Shams   REQUESTING/REFERRING PHYSICIAN: Paulette Blanch, MD  CHIEF COMPLAINT:   Chief Complaint  Patient presents with  . Cough    HISTORY OF PRESENT ILLNESS:  Danny Cook  is a 57 y.o. male with a known history of T2NIDDM, HTN, HLD, CKD III, HIV (HAART), intellectual disability p/w pneumonia, sepsis. Pt appears to have mild mental retardation/intellectual disability, but he is AAOx3. He is able to provide a minimal Hx, but his complaints are largely vague, and his narrative does not follow a specific timecourse. He tells me that he felt fairly well up until Saturday 04/24/2018, when he developed non-productive cough, SOB, and decreased exercise tolerance. He then tells me he has had chills and diaphoresis x2d. He then tells me he had diarrhea 3-4d ago, but it stopped. He is tachypneic, but is not in distress. He denies chest pain. He says he feels okay. Pt meets SIRS criteria, (+) F/WBC + tachypnea, (+) sepsis 2/2 HCAP. Pt exhibits a flat affect. I explained that he probably has a lung infection. He tells me he understands.  PAST MEDICAL HISTORY:   Past Medical History:  Diagnosis Date  . Anemia   . Anxiety   . Arthritis   . CHF (congestive heart failure) ([place])   . Chronic kidney disease    Renal Insufficiency Syndrome; Glomerulosclerosis 2013  . Complication of anesthesia   . Depressed   . Diabetes mellitus without complication ([place])   . High cholesterol   . HIV (human immunodeficiency virus infection) ([place])   . Hypertension   . 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 Paranoid disorder ([place])   . Schizophrenia, paranoid ([place])   . Sleep apnea     PAST SURGICAL HISTORY:   Past Surgical History:  Procedure Laterality Date  . COLONOSCOPY WITH PROPOFOL N/A 09/03/2015   Procedure:  COLONOSCOPY WITH PROPOFOL;  Surgeon: Lollie Sails, MD;  Location: [facility] ENDOSCOPY;  Service: Endoscopy;  Laterality: N/A;  . DG TEETH FULL    . ESOPHAGOGASTRODUODENOSCOPY (EGD) WITH PROPOFOL N/A 07/01/2016   Procedure: ESOPHAGOGASTRODUODENOSCOPY (EGD) WITH PROPOFOL;  Surgeon: Lollie Sails, MD;  Location: [facility] ENDOSCOPY;  Service: Endoscopy;  Laterality: N/A;    SOCIAL HISTORY:   Social History   Tobacco Use  . Smoking status: Never Smoker  . Smokeless tobacco: Never Used  Substance Use Topics  . Alcohol use: No    FAMILY HISTORY:   Family History  Problem Relation Age of Onset  . Heart attack Mother   . Diabetes Mellitus II Mother   . Mental illness Mother   . CAD Mother   . Heart attack Father   . CAD Father   . Hypertension Father     DRUG ALLERGIES:   Allergies  Allergen Reactions  . Trazodone And Nefazodone   . Viread [Tenofovir Disoproxil]     Damages kidneys  . Etodolac Rash    REVIEW OF SYSTEMS:   Review of Systems  Constitutional: Positive for chills and diaphoresis. Negative for fever, malaise/fatigue and weight loss.  HENT: Negative for congestion, ear pain, hearing loss, nosebleeds, sinus pain, sore throat and tinnitus.   Eyes: Negative for blurred vision, double vision and photophobia.  Respiratory: Positive for cough and shortness of breath. Negative for hemoptysis, sputum production and wheezing.  Cardiovascular: Negative for chest pain, palpitations, orthopnea, claudication, leg swelling and PND.  Gastrointestinal: Positive for diarrhea. Negative for abdominal pain, blood in stool, constipation, heartburn, melena, nausea and vomiting.  Genitourinary: Negative for dysuria, frequency, hematuria and urgency.  Musculoskeletal: Negative for back pain, joint pain, myalgias and neck pain.  Skin: Negative for itching and rash.  Neurological: Negative for dizziness, tingling, tremors, sensory change, speech change, focal weakness, seizures, loss of  consciousness, weakness and headaches.  Psychiatric/Behavioral: Negative for memory loss. The patient does not have insomnia.    MEDICATIONS AT HOME:   Prior to Admission medications   Medication Sig Start Date End Date Taking? Authorizing Provider  Abacavir-Dolutegravir-Lamivud (TRIUMEQ) 600-50-300 MG TABS Take 1 tablet by mouth daily.    [provider]  albuterol (PROVENTIL HFA;VENTOLIN HFA) 108 (90 Base) MCG/ACT inhaler Inhale 2 puffs into the lungs every 6 (six) hours as needed for wheezing or shortness of breath. 04/14/18   Loletha Grayer, MD  amiodarone (PACERONE) 100 MG tablet Take 1 tablet (100 mg total) by mouth daily. 04/14/18   Loletha Grayer, MD  aspirin EC 325 MG tablet Take 325 mg by mouth daily.    [provider]  benzonatate (TESSALON PERLES) 100 MG capsule Take 1 capsule (100 mg total) by mouth every 6 (six) hours as needed for cough. 04/24/18 04/24/19  Schuyler Amor, MD  butalbital-acetaminophen-caffeine (FIORICET, ESGIC) [id number] MG tablet Take 1-2 tablets by mouth every 6 (six) hours as needed. Patient taking differently: Take 1-2 tablets by mouth every 6 (six) hours as needed for migraine.  01/31/18 01/31/19  Earleen Newport, MD  cefdinir (OMNICEF) 300 MG capsule Take 1 capsule (300 mg total) by mouth 2 (two) times daily. 04/14/18   Loletha Grayer, MD  chlorpheniramine-HYDROcodone (TUSSIONEX) 10-8 MG/5ML SUER Take 5 mLs by mouth every 12 (twelve) hours as needed for cough. 04/14/18   Loletha Grayer, MD  clonazePAM (KLONOPIN) 1 MG tablet Take 1 mg by mouth 2 (two) times daily. Take 1 mg in the morning, 0.5 mg mid-day and 1 mg in the evening.    [provider]  cyanocobalamin 1000 MCG tablet Take 1,000 mcg by mouth daily.    [provider]  diphenhydrAMINE (BENADRYL) 50 MG capsule Take 1 capsule (50 mg total) by mouth at bedtime as needed for sleep. 11/09/17   McNew, Tyson Babinski, MD  divalproex (DEPAKOTE ER) 250 MG 24 hr tablet  Take 500 mg by mouth 2 (two) times daily.     [provider]  doxycycline (VIBRAMYCIN) 100 MG capsule Take 1 capsule (100 mg total) by mouth 2 (two) times daily. 04/24/18   Schuyler Amor, MD  enalapril (VASOTEC) 2.5 MG tablet Take 1 tablet (2.5 mg total) by mouth daily. 03/28/18   Wieting, Richard, MD  FANAPT 12 MG TABS Take 12 mg by mouth 2 (two) times daily.  11/14/16   [provider]  fluticasone (FLOVENT HFA) 220 MCG/ACT inhaler Inhale 2 puffs into the lungs 2 (two) times daily. Rinse out mouth afterwards 04/14/18   Loletha Grayer, MD  liraglutide (VICTOZA) 18 MG/3ML SOPN Inject 1.8 mg into the skin See admin instructions. Daily if blood sugar is greater than 85; patient uses tresiba if blood sugar is less than 85    [provider]  metFORMIN (GLUCOPHAGE) 1000 MG tablet Take 1,000 mg by mouth 2 (two) times daily with a meal.    [provider]  nitroGLYCERIN (NITROSTAT) 0.4 MG SL tablet Place 0.4 mg under  the tongue every 5 (five) minutes as needed for chest pain.     [provider]  Nutritional Supplements (FEEDING SUPPLEMENT, NEPRO CARB STEADY,) LIQD Take 237 mLs by mouth 2 (two) times daily between meals. 03/27/18   Loletha Grayer, MD  omeprazole (PRILOSEC) 40 MG capsule Take 40 mg by mouth 2 (two) times daily.    [provider]  predniSONE (DELTASONE) 10 MG tablet 4 tabs po daily for 3 dyays 04/14/18   Loletha Grayer, MD  promethazine (PHENERGAN) 6.25 MG/5ML syrup Take 6.25 mg by mouth every 6 (six) hours as needed for nausea or vomiting.    [provider]  rosuvastatin (CRESTOR) 20 MG tablet Take 20 mg by mouth daily.    [provider]  triamcinolone cream (KENALOG) 0.1 % Apply 1 application topically 2 (two) times daily as needed. Patient taking differently: Apply 1 application topically 2 (two) times daily as needed (for rash).  11/16/16   Little, Traci M, PA-C  venlafaxine XR (EFFEXOR-XR) 75 MG 24 hr  capsule Take 1 capsule (75 mg total) by mouth daily with breakfast. 11/09/17   McNew, Tyson Babinski, MD      VITAL SIGNS:  Blood pressure 103/68, pulse 89, temperature 98.6 F (37 C), temperature source Oral, resp. rate (!) 26, SpO2 97 %.  PHYSICAL EXAMINATION:  Physical Exam  Constitutional: He is oriented to person, place, and time. He appears well-developed and well-nourished. He is active and cooperative. He has a sickly appearance. He appears ill. No distress. He is not intubated.  HENT:  Head: Atraumatic.  Mouth/Throat: Oropharynx is clear and moist. No oropharyngeal exudate.  Eyes: Conjunctivae, EOM and lids are normal. No scleral icterus.  Neck: Neck supple. No JVD present. No thyromegaly present.  Cardiovascular: Normal rate, regular rhythm, S1 normal, S2 normal and normal heart sounds.  No extrasystoles are present. Exam reveals no gallop, no S3, no distant heart sounds and no friction rub.  No murmur heard. Pulmonary/Chest: Effort normal. No accessory muscle usage or stridor. Tachypnea noted. No apnea and no bradypnea. He is not intubated. No respiratory distress. He has decreased breath sounds in the right upper field, the right middle field, the right lower field, the left upper field, the left middle field and the left lower field. He has no wheezes. He has no rhonchi. He has rales in the right lower field and the left lower field.  Abdominal: Soft. Bowel sounds are normal. He exhibits no distension. There is no tenderness. There is no rigidity, no rebound and no guarding.  Musculoskeletal: Normal range of motion. He exhibits no edema or tenderness.  Lymphadenopathy:    He has no cervical adenopathy.  Neurological: He is alert and oriented to person, place, and time. He is not disoriented.  Mild intellectual disability. Flat affect.  Skin: Skin is warm, dry and intact. No rash noted. He is not diaphoretic. No erythema.  Psychiatric: His speech is normal. Judgment and thought content  normal. His affect is blunt. He is slowed and withdrawn. Cognition and memory are impaired.  Mild intellectual disability. Flat affect.   LABORATORY PANEL:   CBC Recent Labs  Lab 04/25/18 0316  WBC 12.6*  HGB 11.4*  HCT 35.4*  PLT 243   ------------------------------------------------------------------------------------------------------------------  Chemistries  Recent Labs  Lab 04/25/18 0316 04/25/18 0548  NA 138  --   K 3.7  --   CL 102  --   CO2 25  --   GLUCOSE 214*  --   BUN  17  --   CREATININE 1.41*  --   CALCIUM 8.9  --   MG  --  0.8*  AST 13*  --   ALT 16  --   ALKPHOS 85  --   BILITOT 0.8  --    ------------------------------------------------------------------------------------------------------------------  Cardiac Enzymes Recent Labs  Lab 04/25/18 0316  TROPONINI 0.05*   ------------------------------------------------------------------------------------------------------------------  RADIOLOGY:  Dg Chest 2 View  Result Date: 04/24/2018 CLINICAL DATA:  Dry cough, fever, and chills since yesterday. Patient also states SOB on exertion. Patient states that he was discharged from hospital 10 days ago where he was admitted with pneumonia. Hx of CHF, diabetes, Kaposi's sarcoma, HIV. EXAM: CHEST - 2 VIEW COMPARISON:  04/12/2018 and older exams FINDINGS: There is increased opacity at the left base now obscuring the left heart border as well as the hemidiaphragm. Remainder of the lungs is clear. Small to moderate left pleural effusion. No right pleural effusion. No pneumothorax. Cardiac silhouette is normal in size and configuration. No mediastinal or hilar masses. No evidence of adenopathy. Skeletal structures are unremarkable. IMPRESSION: 1. Increased opacity at the left lung base since the prior exams. This is consistent with a combination of pleural fluid with atelectasis and/or pneumonia. 2. No other abnormality.  No pulmonary edema. Electronically Signed    By: Lajean Manes M.D.   On: 04/24/2018 16:01   Dg Chest Port 1 View  Result Date: 04/25/2018 CLINICAL DATA:  56 y/o  M; cough and shortness of breath. EXAM: PORTABLE CHEST 1 VIEW COMPARISON:  04/24/2018 chest radiograph. FINDINGS: Normal cardiac silhouette. Stable small to moderate left pleural effusion and basilar opacity. No new consolidation. No pneumothorax. No acute osseous abnormality is evident. IMPRESSION: Stable small to moderate left pleural effusion and basilar opacity which may represent associated atelectasis or pneumonia. Electronically Signed   By: Kristine Garbe M.D.   On: 04/25/2018 03:59   IMPRESSION AND PLAN:   A/P: 45M acute bacterial healthcare acquired pneumonia, sepsis. Hyperglycemia (w/ T2NIDDM), Cr elevation/CKD III, hypokalemia, hypomagnesemia, hypophosphatemia, hypoalbuminemia, Troponin elevation, leukocytosis, normocytic anemia. -Acute bacterial healthcare acquired pneumonia, sepsis: Cough, SOB, chills, diaphoresis. WBC 12.6, tachypnea, febrile (T 102). SIRS (+). CXR (+) L basilar opacity. (+) sepsis. Multiple recent hospitalizations. Tx for acute bacterial healthcare acquired pneumonia. Lactate 1.9. Broad-spectrum ABx (Vancomycin + Zosyn). BCx, sputum Cx/gram, UStrep + U[place]alla, PCT pending. PRN nebs, incentive spirometry, pulmonary toileting. -Hyperglycemia, T2NIDDM: SSI. Hold home agents. -Cr elevation, CKD III: Cr 1.41 on present admission. Baseline Cr 1.2-1.4 based on review of prior labwork, pt presently at baseline. Known CKD III (2/2 DM, HTN, aged kidney). Monitor BMP, avoid nephrotoxins. -Hypokalemia, hypomagnesemia, hypophosphatemia, hypoalbuminemia: Likely collectively 2/2 inadequate dietary intake, malnourishment. Replete. Prealbumin. -Troponin elevation: Trop-I 0.05. (-) CP. EKG (-) STE. Cardiac monitoring. c/w ASA, statin, home cardiac meds. Elevation likely 2/2 sepsis, demand, impaired renal clearance. -Normocytic anemia: Likely 2/2 anemia of  chronic (kidney) disease. No evidence of acute blood loss at present time. -c/w other home meds. -FEN/GI: Cardiac diabetic diet. -DVT PPx: Lovenox. -Code status: Full code. -Disposition: Admission, > 2 midnights.   All the records are reviewed and case discussed with ED provider. Management plans discussed with the patient, family and they are in agreement.  CODE STATUS: Full code.  TOTAL TIME TAKING CARE OF THIS PATIENT: 75 minutes.    Arta Silence M.D on 04/25/2018 at 9:55 AM  Between 7am to 6pm - Pager - [phone]  After 6pm go to [email or web address] - [facility]  [facility]  Office  [phone]  CC: Primary care physician; Katheren Shams   Note: This dictation was prepared with Dragon dictation along with smaller phrase technology. Any transcriptional errors that result from this process are unintentional.

## 2018-04-25 NOTE — Progress Notes (Deleted)
CODE SEPSIS - PHARMACY COMMUNICATION  **Broad Spectrum Antibiotics should be administered within 1 hour of Sepsis diagnosis**  Time Code Sepsis Called/Page Received: 1595  Antibiotics Ordered: 3967  Time of 1st antibiotic administration: azithro/ceftriaxone  Additional action taken by pharmacy:   If necessary, Name of Provider/Nurse Contacted:     Tobie Lords ,PharmD Clinical Pharmacist  04/25/2018  3:57 AM

## 2018-04-25 NOTE — ED Provider Notes (Signed)
[facility] Emergency Department Provider Note   ____________________________________________   First MD Initiated Contact with Patient 04/25/18 [phone]     (approximate)  I have reviewed the triage vital signs and the nursing notes.   HISTORY  Chief Complaint Cough  Level V caveat: Limited by flat affect  HPI Danny Cook. is a 57 y.o. male brought to the ED via EMS from local care home.  Patient with a history of HIV, schizophrenia, hypertension, CHF who was seen in the ED less than 24 hours ago for cough and shortness of breath.  Found to have left-sided pneumonia and discharged on doxycycline.  Patient was unable to get antibiotic or cough medicine filled.  Returns with worsening cough and shortness of breath.  Denies chest pain, abdominal pain, nausea, vomiting, dysuria or diarrhea.  Denies recent travel or trauma.   Past Medical History:  Diagnosis Date  . Anemia   . Anxiety   . Arthritis   . CHF (congestive heart failure) ([place])   . Chronic kidney disease    Renal Insufficiency Syndrome; Glomerulosclerosis 2013  . Complication of anesthesia   . Depressed   . Diabetes mellitus without complication ([place])   . High cholesterol   . HIV (human immunodeficiency virus infection) ([place])   . Hypertension   . 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 Paranoid disorder ([place])   . Schizophrenia, paranoid ([place])   . Sleep apnea     Patient Active Problem List   Diagnosis Date Noted  . Community acquired pneumonia 04/12/2018  . Pneumonia 04/12/2018  . AKI (acute kidney injury) ([place]) 03/23/2018  . Pericarditis with effusion 03/23/2018  . Schizoaffective disorder, depressive type ([place]) 11/04/2017  . HIV positive ([place]) 11/04/2017  . Orthostatic dizziness 07/31/2017  . Chronic diastolic heart failure ([place]) 07/29/2017  . HTN (hypertension) 07/29/2017  . Diabetes ([place]) 07/29/2017  . Schizophrenia ([place]) 07/29/2017    Past Surgical History:  Procedure  Laterality Date  . COLONOSCOPY WITH PROPOFOL N/A 09/03/2015   Procedure: COLONOSCOPY WITH PROPOFOL;  Surgeon: Lollie Sails, MD;  Location: [facility] ENDOSCOPY;  Service: Endoscopy;  Laterality: N/A;  . DG TEETH FULL    . ESOPHAGOGASTRODUODENOSCOPY (EGD) WITH PROPOFOL N/A 07/01/2016   Procedure: ESOPHAGOGASTRODUODENOSCOPY (EGD) WITH PROPOFOL;  Surgeon: Lollie Sails, MD;  Location: [facility] ENDOSCOPY;  Service: Endoscopy;  Laterality: N/A;    Prior to Admission medications   Medication Sig Start Date End Date Taking? Authorizing Provider  Abacavir-Dolutegravir-Lamivud (TRIUMEQ) 600-50-300 MG TABS Take 1 tablet by mouth daily.    [provider]  albuterol (PROVENTIL HFA;VENTOLIN HFA) 108 (90 Base) MCG/ACT inhaler Inhale 2 puffs into the lungs every 6 (six) hours as needed for wheezing or shortness of breath. 04/14/18   Loletha Grayer, MD  amiodarone (PACERONE) 100 MG tablet Take 1 tablet (100 mg total) by mouth daily. 04/14/18   Loletha Grayer, MD  aspirin EC 325 MG tablet Take 325 mg by mouth daily.    [provider]  benzonatate (TESSALON PERLES) 100 MG capsule Take 1 capsule (100 mg total) by mouth every 6 (six) hours as needed for cough. 04/24/18 04/24/19  Schuyler Amor, MD  butalbital-acetaminophen-caffeine (FIORICET, ESGIC) [phone] MG tablet Take 1-2 tablets by mouth every 6 (six) hours as needed. Patient taking differently: Take 1-2 tablets by mouth every 6 (six) hours as needed for migraine.  01/31/18 01/31/19  Earleen Newport, MD  cefdinir (OMNICEF) 300 MG capsule Take 1 capsule (300 mg total) by mouth  2 (two) times daily. 04/14/18   Loletha Grayer, MD  chlorpheniramine-HYDROcodone (TUSSIONEX) 10-8 MG/5ML SUER Take 5 mLs by mouth every 12 (twelve) hours as needed for cough. 04/14/18   Loletha Grayer, MD  clonazePAM (KLONOPIN) 1 MG tablet Take 1 mg by mouth 2 (two) times daily. Take 1 mg in the morning, 0.5 mg mid-day and 1 mg in the evening.    [provider]  cyanocobalamin 1000 MCG tablet Take 1,000 mcg by mouth daily.    [provider]  diphenhydrAMINE (BENADRYL) 50 MG capsule Take 1 capsule (50 mg total) by mouth at bedtime as needed for sleep. 11/09/17   McNew, Tyson Babinski, MD  divalproex (DEPAKOTE ER) 250 MG 24 hr tablet Take 500 mg by mouth 2 (two) times daily.     [provider]  doxycycline (VIBRAMYCIN) 100 MG capsule Take 1 capsule (100 mg total) by mouth 2 (two) times daily. 04/24/18   Schuyler Amor, MD  enalapril (VASOTEC) 2.5 MG tablet Take 1 tablet (2.5 mg total) by mouth daily. 03/28/18   Wieting, Richard, MD  FANAPT 12 MG TABS Take 12 mg by mouth 2 (two) times daily.  11/14/16   [provider]  fluticasone (FLOVENT HFA) 220 MCG/ACT inhaler Inhale 2 puffs into the lungs 2 (two) times daily. Rinse out mouth afterwards 04/14/18   Loletha Grayer, MD  liraglutide (VICTOZA) 18 MG/3ML SOPN Inject 1.8 mg into the skin See admin instructions. Daily if blood sugar is greater than 85; patient uses tresiba if blood sugar is less than 85    [provider]  metFORMIN (GLUCOPHAGE) 1000 MG tablet Take 1,000 mg by mouth 2 (two) times daily with a meal.    [provider]  nitroGLYCERIN (NITROSTAT) 0.4 MG SL tablet Place 0.4 mg under the tongue every 5 (five) minutes as needed for chest pain.     [provider]  Nutritional Supplements (FEEDING SUPPLEMENT, NEPRO CARB STEADY,) LIQD Take 237 mLs by mouth 2 (two) times daily between meals. 03/27/18   Loletha Grayer, MD  omeprazole (PRILOSEC) 40 MG capsule Take 40 mg by mouth 2 (two) times daily.    [provider]  predniSONE (DELTASONE) 10 MG tablet 4 tabs po daily for 3 dyays 04/14/18   Loletha Grayer, MD  promethazine (PHENERGAN) 6.25 MG/5ML syrup Take 6.25 mg by mouth every 6 (six) hours as needed for nausea or vomiting.    [provider]  rosuvastatin (CRESTOR) 20 MG tablet Take 20 mg by mouth daily.    [provider]  triamcinolone cream (KENALOG) 0.1 % Apply 1 application topically 2 (two) times daily as needed. Patient taking differently: Apply 1 application topically 2 (two) times daily as needed (for rash).  11/16/16   Little, Traci M, PA-C  venlafaxine XR (EFFEXOR-XR) 75 MG 24 hr capsule Take 1 capsule (75 mg total) by mouth daily with breakfast. 11/09/17   McNew, Tyson Babinski, MD    Allergies Trazodone and nefazodone; Viread [tenofovir disoproxil]; and Etodolac  Family History  Problem Relation Age of Onset  . Heart attack Mother   . Diabetes Mellitus II Mother   . Mental illness Mother   . CAD Mother   . Heart attack Father   . CAD Father   . Hypertension Father     Social History Social History   Tobacco Use  . Smoking status: Never Smoker  . Smokeless tobacco: Never Used  Substance Use Topics  . Alcohol use: No  .  Drug use: No    Review of Systems   Constitutional: Positive for fever/chills Eyes: No visual changes. ENT: No sore throat. Cardiovascular: Denies chest pain. Respiratory: Positive for cough and shortness of breath. Gastrointestinal: No abdominal pain.  No nausea, no vomiting.  No diarrhea.  No constipation. Genitourinary: Negative for dysuria. Musculoskeletal: Negative for back pain. Skin: Negative for rash. Neurological: Negative for headaches, focal weakness or numbness.   ____________________________________________   PHYSICAL EXAM:  VITAL SIGNS: ED Triage Vitals [04/25/18 0309]  Enc Vitals Group     BP      Pulse      Resp      Temp      Temp src      SpO2      Weight      Height      Head Circumference      Peak Flow      Pain Score 0     Pain Loc      Pain Edu?      Excl. in [place]?     Constitutional: Alert and oriented.  Weak appearing and in mild acute distress. Eyes: Conjunctivae are normal. PERRL. EOMI. Head: Atraumatic. Nose: No congestion/rhinnorhea. Mouth/Throat: Mucous membranes are moist.  Oropharynx  non-erythematous. Neck: No stridor.   Cardiovascular: Tachycardic rate, regular rhythm. Grossly normal heart sounds.  Good peripheral circulation. Respiratory: Increased respiratory effort.  No retractions. Lungs with scattered rales and rhonchi. Gastrointestinal: Soft and nontender. No distention. No abdominal bruits. No CVA tenderness. Musculoskeletal: No lower extremity tenderness nor edema.  No joint effusions. Neurologic:  Normal speech and language. No gross focal neurologic deficits are appreciated.  Skin:  Skin is hot, diaphoretic and intact. No rash noted.  No petechiae. Psychiatric: Mood and affect are flat. Speech and behavior are normal.  ____________________________________________   LABS (all labs ordered are listed, but only abnormal results are displayed)  Labs Reviewed  COMPREHENSIVE METABOLIC PANEL - Abnormal; Notable for the following components:      Result Value   Glucose, Bld 214 (*)    Creatinine, Ser 1.41 (*)    Albumin 3.3 (*)    AST 13 (*)    GFR calc non Af Amer 53 (*)    All other components within normal limits  CBC WITH DIFFERENTIAL/PLATELET - Abnormal; Notable for the following components:   WBC 12.6 (*)    RBC 3.84 (*)    Hemoglobin 11.4 (*)    HCT 35.4 (*)    RDW 15.7 (*)    Neutro Abs 10.2 (*)    Monocytes Absolute 1.1 (*)    Abs Immature Granulocytes 0.11 (*)    All other components within normal limits  TROPONIN I - Abnormal; Notable for the following components:   Troponin I 0.05 (*)    All other components within normal limits  CULTURE, BLOOD (ROUTINE X 2)  CULTURE, BLOOD (ROUTINE X 2)  URINE CULTURE  LACTIC ACID, PLASMA  URINALYSIS, ROUTINE W REFLEX MICROSCOPIC  LACTIC ACID, PLASMA   ____________________________________________  EKG  ED ECG REPORT I, Jil Penland J, the attending physician, personally viewed and interpreted this ECG.   Date: 04/25/2018  EKG Time: 0330  Rate: 98  Rhythm: normal EKG, normal sinus rhythm  Axis:  Normal  Intervals:none  ST&T Change: Nonspecific; Prolonged QTC  ____________________________________________  RADIOLOGY  ED MD interpretation: Left-sided pneumonia; possible pleural effusion  Official radiology report(s): Dg Chest 2 View  Result Date: 04/24/2018 CLINICAL DATA:  Dry cough, fever, and chills  since yesterday. Patient also states SOB on exertion. Patient states that he was discharged from hospital 10 days ago where he was admitted with pneumonia. Hx of CHF, diabetes, Kaposi's sarcoma, HIV. EXAM: CHEST - 2 VIEW COMPARISON:  04/12/2018 and older exams FINDINGS: There is increased opacity at the left base now obscuring the left heart border as well as the hemidiaphragm. Remainder of the lungs is clear. Small to moderate left pleural effusion. No right pleural effusion. No pneumothorax. Cardiac silhouette is normal in size and configuration. No mediastinal or hilar masses. No evidence of adenopathy. Skeletal structures are unremarkable. IMPRESSION: 1. Increased opacity at the left lung base since the prior exams. This is consistent with a combination of pleural fluid with atelectasis and/or pneumonia. 2. No other abnormality.  No pulmonary edema. Electronically Signed   By: Lajean Manes M.D.   On: 04/24/2018 16:01   Dg Chest Port 1 View  Result Date: 04/25/2018 CLINICAL DATA:  57 y/o  M; cough and shortness of breath. EXAM: PORTABLE CHEST 1 VIEW COMPARISON:  04/24/2018 chest radiograph. FINDINGS: Normal cardiac silhouette. Stable small to moderate left pleural effusion and basilar opacity. No new consolidation. No pneumothorax. No acute osseous abnormality is evident. IMPRESSION: Stable small to moderate left pleural effusion and basilar opacity which may represent associated atelectasis or pneumonia. Electronically Signed   By: Kristine Garbe M.D.   On: 04/25/2018 03:59    ____________________________________________   PROCEDURES  Procedure(s) performed:  None  Procedures  Critical Care performed: Yes, see critical care note(s)   CRITICAL CARE Performed by: Paulette Blanch   Total critical care time: 30 minutes  Critical care time was exclusive of separately billable procedures and treating other patients.  Critical care was necessary to treat or prevent imminent or life-threatening deterioration.  Critical care was time spent personally by me on the following activities: development of treatment plan with patient and/or surrogate as well as nursing, discussions with consultants, evaluation of patient's response to treatment, examination of patient, obtaining history from patient or surrogate, ordering and performing treatments and interventions, ordering and review of laboratory studies, ordering and review of radiographic studies, pulse oximetry and re-evaluation of patient's condition.  ____________________________________________   INITIAL IMPRESSION / ASSESSMENT AND PLAN / ED COURSE  As part of my medical decision making, I reviewed the following data within the [place] notes reviewed and incorporated, Labs reviewed, EKG interpreted, Old chart reviewed, Radiograph reviewed, Discussed with admitting physician and Notes from prior ED visits   57 year old male who returns to the ED with worsening cough, now with fever, tachypnea and hypoxia. Differential includes, but is not limited to, viral syndrome, bronchitis including COPD exacerbation, pneumonia, reactive airway disease including asthma, CHF including exacerbation with or without pulmonary/interstitial edema, pneumothorax, ACS, thoracic trauma, and pulmonary embolism.  ED code sepsis initiated on patient's arrival to the treatment room.  Will order antibiotics for community-acquired pneumonia as patient is in a local care home (not nursing facility).  Will discuss with hospitalist to evaluate patient in the emergency department for admission.       ____________________________________________   FINAL CLINICAL IMPRESSION(S) / ED DIAGNOSES  Final diagnoses:  Community acquired pneumonia of left lung, unspecified part of lung  Sepsis, due to unspecified organism, unspecified whether acute organ dysfunction present ([place])  Hypoxia  Fever, unspecified fever cause     ED Discharge Orders    None       Note:  This document was prepared using Dragon  voice recognition software and may include unintentional dictation errors.    Paulette Blanch, MD 04/25/18 [phone]

## 2018-04-25 NOTE — ED Notes (Addendum)
Date and time results received: 04/25/18 0945   Test: magnesium Critical Value: 0.08  Name of Provider Notified: Dr. Anselm Jungling   Orders Received? Or Actions Taken?: stated he was going to order IV magnesium

## 2018-04-25 NOTE — ED Notes (Signed)
Patient requesting to speak to MD. MD Beather Arbour to beside.

## 2018-04-25 NOTE — ED Notes (Signed)
Magnesium infusion delayed while IV access is obtained in the right arm to avoid compatibility issues with potassium phosphate

## 2018-04-25 NOTE — ED Triage Notes (Signed)
Patient triage via wheelchair by EMS from local care home.  EMS reports patient was seen here earlier and has returned be cough not getting any better and feeling short of breath.  Per EMS patient was unable to get prescription for cough med fill because pharmacy told him Medicare would not pay for it.  Antibiotic will be filled on Monday.  EMS vital signs;  HR - 105, BP - 107? 71, pulse oxi on room air 92%.

## 2018-04-25 NOTE — Progress Notes (Signed)
CODE SEPSIS - PHARMACY COMMUNICATION  **Broad Spectrum Antibiotics should be administered within 1 hour of Sepsis diagnosis**  Time Code Sepsis Called/Page Received: 0211  Antibiotics Ordered: 1735  Time of 1st antibiotic administration: azithro/ceftriaxone  Additional action taken by pharmacy:   If necessary, Name of Provider/Nurse Contacted:     Tobie Lords ,PharmD Clinical Pharmacist  04/25/2018  3:57 AM

## 2018-04-25 NOTE — Progress Notes (Signed)
Pharmacy Antibiotic Note  Danny Cook. is a 57 y.o. male admitted on 04/25/2018 with pneumonia.  Pharmacy has been consulted for vanc/zosyn dosing.  Plan: Will start vanc 1.25g IV q12h  Will draw trough 10/28 @ 1700 prior to 4th dose Will start zosyn 3.375g IV q8h  Ke 0.0555 T1/2 ~ 12 hrs Goal trough 15 - 20 mcg/mL     Temp (24hrs), Avg:99.2 F (37.3 C), Min:98.1 F (36.7 C), Max:102 F (38.9 C)  Recent Labs  Lab 04/24/18 1444 04/25/18 0316  WBC 12.8* 12.6*  CREATININE 1.30* 1.41*  LATICACIDVEN  --  1.9    Estimated Creatinine Clearance: 61.6 mL/min (A) (by C-G formula based on SCr of 1.41 mg/dL (H)).    Allergies  Allergen Reactions  . Trazodone And Nefazodone   . Viread [Tenofovir Disoproxil]     Damages kidneys  . Etodolac Rash    Thank you for allowing pharmacy to be a part of this patient's care.  Tobie Lords, PharmD, BCPS Clinical Pharmacist 04/25/2018

## 2018-04-25 NOTE — ED Notes (Signed)
Pt ambulatory to toilet to use toilet. Steady gait noted.

## 2018-04-26 ENCOUNTER — Inpatient Hospital Stay: Payer: Medicare Other

## 2018-04-26 LAB — GLUCOSE, CAPILLARY
GLUCOSE-CAPILLARY: 141 mg/dL — AB (ref 70–99)
GLUCOSE-CAPILLARY: 239 mg/dL — AB (ref 70–99)
Glucose-Capillary: 174 mg/dL — ABNORMAL HIGH (ref 70–99)
Glucose-Capillary: 176 mg/dL — ABNORMAL HIGH (ref 70–99)
Glucose-Capillary: 206 mg/dL — ABNORMAL HIGH (ref 70–99)

## 2018-04-26 LAB — BASIC METABOLIC PANEL
ANION GAP: 7 (ref 5–15)
BUN: 16 mg/dL (ref 6–20)
CO2: 22 mmol/L (ref 22–32)
Calcium: 8 mg/dL — ABNORMAL LOW (ref 8.9–10.3)
Chloride: 108 mmol/L (ref 98–111)
Creatinine, Ser: 1.59 mg/dL — ABNORMAL HIGH (ref 0.61–1.24)
GFR calc Af Amer: 53 mL/min — ABNORMAL LOW (ref >60)
GFR calc non Af Amer: 46 mL/min — ABNORMAL LOW (ref >60)
GLUCOSE: 246 mg/dL — AB (ref 70–99)
Potassium: 3.8 mmol/L (ref 3.5–5.1)
Sodium: 137 mmol/L (ref 135–145)

## 2018-04-26 LAB — URINE CULTURE: Culture: NO GROWTH

## 2018-04-26 LAB — CBC
HEMATOCRIT: 29.4 % — AB (ref 39.0–52.0)
Hemoglobin: 9.3 g/dL — ABNORMAL LOW (ref 13.0–17.0)
MCH: 29.5 pg (ref 26.0–34.0)
MCHC: 31.6 g/dL (ref 30.0–36.0)
MCV: 93.3 fL (ref 80.0–100.0)
NRBC: 0 % (ref 0.0–0.2)
Platelets: 167 10*3/uL (ref 150–400)
RBC: 3.15 MIL/uL — AB (ref 4.22–5.81)
RDW: 15.8 % — ABNORMAL HIGH (ref 11.5–15.5)
WBC: 14.1 10*3/uL — ABNORMAL HIGH (ref 4.0–10.5)

## 2018-04-26 LAB — PROCALCITONIN
PROCALCITONIN: 0.22 ng/mL
Procalcitonin: <0.1 ng/mL

## 2018-04-26 LAB — PHOSPHORUS: PHOSPHORUS: 2.3 mg/dL — AB (ref 2.5–4.6)

## 2018-04-26 LAB — STREP PNEUMONIAE URINARY ANTIGEN: Strep Pneumo Urinary Antigen: NEGATIVE

## 2018-04-26 LAB — TROPONIN I
TROPONIN I: 0.03 ng/mL — AB (ref <0.03)
TROPONIN I: 0.03 ng/mL — AB (ref <0.03)

## 2018-04-26 LAB — MAGNESIUM: MAGNESIUM: 2.1 mg/dL (ref 1.7–2.4)

## 2018-04-26 LAB — PREALBUMIN: PREALBUMIN: 21.2 mg/dL (ref 18–38)

## 2018-04-26 LAB — MRSA PCR SCREENING: MRSA by PCR: NEGATIVE

## 2018-04-26 MED ORDER — PANTOPRAZOLE SODIUM 40 MG PO TBEC
40.0000 mg | DELAYED_RELEASE_TABLET | Freq: Two times a day (BID) | ORAL | Status: DC
  Administered 2018-04-26 – 2018-05-03 (×14): 40 mg via ORAL
  Filled 2018-04-26 (×14): qty 1

## 2018-04-26 MED ORDER — ROSUVASTATIN CALCIUM 10 MG PO TABS
40.0000 mg | ORAL_TABLET | Freq: Every day | ORAL | Status: DC
  Administered 2018-04-27 – 2018-05-03 (×7): 40 mg via ORAL
  Filled 2018-04-26 (×7): qty 4

## 2018-04-26 MED ORDER — SODIUM CHLORIDE 0.9 % IV BOLUS
500.0000 mL | Freq: Once | INTRAVENOUS | Status: AC
  Administered 2018-04-26: 500 mL via INTRAVENOUS

## 2018-04-26 MED ORDER — PIPERACILLIN-TAZOBACTAM 3.375 G IVPB
3.3750 g | Freq: Three times a day (TID) | INTRAVENOUS | Status: DC
  Administered 2018-04-26 (×2): 3.375 g via INTRAVENOUS
  Filled 2018-04-26: qty 50

## 2018-04-26 MED ORDER — IBUPROFEN 400 MG PO TABS
200.0000 mg | ORAL_TABLET | Freq: Once | ORAL | Status: AC
  Administered 2018-04-26: 200 mg via ORAL
  Filled 2018-04-26: qty 1

## 2018-04-26 MED ORDER — GUAIFENESIN-DM 100-10 MG/5ML PO SYRP
5.0000 mL | ORAL_SOLUTION | ORAL | Status: DC | PRN
  Administered 2018-04-26 – 2018-05-02 (×7): 5 mL via ORAL
  Filled 2018-04-26 (×7): qty 5

## 2018-04-26 MED ORDER — MIRTAZAPINE 15 MG PO TABS
30.0000 mg | ORAL_TABLET | Freq: Every day | ORAL | Status: DC
  Administered 2018-04-26 – 2018-05-02 (×7): 30 mg via ORAL
  Filled 2018-04-26 (×7): qty 2

## 2018-04-26 MED ORDER — IBUPROFEN 400 MG PO TABS
200.0000 mg | ORAL_TABLET | Freq: Once | ORAL | Status: AC
  Administered 2018-04-27: 200 mg via ORAL
  Filled 2018-04-26: qty 1

## 2018-04-26 MED ORDER — SUCRALFATE 1 G PO TABS
1.0000 g | ORAL_TABLET | Freq: Two times a day (BID) | ORAL | Status: DC
  Administered 2018-04-26 – 2018-05-03 (×13): 1 g via ORAL
  Filled 2018-04-26 (×13): qty 1

## 2018-04-26 MED ORDER — CLONAZEPAM 1 MG PO TABS
1.0000 mg | ORAL_TABLET | Freq: Two times a day (BID) | ORAL | Status: DC | PRN
  Administered 2018-04-27 – 2018-04-30 (×2): 1 mg via ORAL
  Filled 2018-04-26 (×2): qty 1

## 2018-04-26 NOTE — Progress Notes (Addendum)
Pt's BP is 79/54, HR 79. Dr. Anselm Jungling notified. Received verbal orders for 500 cc bolus. Will continue to monitor.

## 2018-04-26 NOTE — Progress Notes (Signed)
[facility] [place] NAME: Danny Cook    MR#:  [id number]  DATE OF BIRTH:  1960-12-18  SUBJECTIVE:  CHIEF COMPLAINT:   Chief Complaint  Patient presents with  . Cough   Came with cough and fever. This is patient's fourth admission with similar complaints.  Every time he gets better with use of IV steroids and once steroid tapers off he starts having fever and cough.  REVIEW OF SYSTEMS:  CONSTITUTIONAL: No fever,have fatigue or weakness.  EYES: No blurred or double vision.  EARS, NOSE, AND THROAT: No tinnitus or ear pain.  RESPIRATORY: Have cough, shortness of breath, no wheezing or hemoptysis.  CARDIOVASCULAR: No chest pain, orthopnea, edema.  GASTROINTESTINAL: No nausea, vomiting, diarrhea or abdominal pain.  GENITOURINARY: No dysuria, hematuria.  ENDOCRINE: No polyuria, nocturia,  HEMATOLOGY: No anemia, easy bruising or bleeding SKIN: No rash or lesion. MUSCULOSKELETAL: No joint pain or arthritis.   NEUROLOGIC: No tingling, numbness, weakness.  PSYCHIATRY: No anxiety or depression.   ROS  DRUG ALLERGIES:   Allergies  Allergen Reactions  . Trazodone And Nefazodone   . Viread [Tenofovir Disoproxil]     Damages kidneys  . Etodolac Rash    VITALS:  Blood pressure 98/62, pulse 78, temperature 98.7 F (37.1 C), temperature source Oral, resp. rate 18, height 5\' 7"  (1.702 m), weight 93 kg, SpO2 98 %.  PHYSICAL EXAMINATION:  GENERAL:  57 y.o.-year-old patient lying in the bed with no acute distress.  EYES: Pupils equal, round, reactive to light and accommodation. No scleral icterus. Extraocular muscles intact.  HEENT: Head atraumatic, normocephalic. Oropharynx and nasopharynx clear.  NECK:  Supple, no jugular venous distention. No thyroid enlargement, no tenderness.  LUNGS: Normal breath sounds bilaterally, no wheezing, some rales,rhonchi or crepitation. No use of accessory muscles of respiration.  CARDIOVASCULAR: S1, S2  normal. No murmurs, rubs, or gallops.  ABDOMEN: Soft, nontender, nondistended. Bowel sounds present. No organomegaly or mass.  EXTREMITIES: No pedal edema, cyanosis, or clubbing.  NEUROLOGIC: Cranial nerves II through XII are intact. Muscle strength 5/5 in all extremities. Sensation intact. Gait not checked.  PSYCHIATRIC: The patient is alert and oriented x 3.  SKIN: No obvious rash, lesion, or ulcer.   Physical Exam LABORATORY PANEL:   CBC Recent Labs  Lab 04/26/18 1811  WBC 14.1*  HGB 9.3*  HCT 29.4*  PLT 167   ------------------------------------------------------------------------------------------------------------------  Chemistries  Recent Labs  Lab 04/25/18 0316  04/26/18 0423 04/26/18 1811  NA 138  --   --  137  K 3.7  --   --  3.8  CL 102  --   --  108  CO2 25  --   --  22  GLUCOSE 214*  --   --  246*  BUN 17  --   --  16  CREATININE 1.41*  --   --  1.59*  CALCIUM 8.9  --   --  8.0*  MG  --    < > 2.1  --   AST 13*  --   --   --   ALT 16  --   --   --   ALKPHOS 85  --   --   --   BILITOT 0.8  --   --   --    < > = values in this interval not displayed.   ------------------------------------------------------------------------------------------------------------------  Cardiac Enzymes Recent Labs  Lab 04/25/18 0805 04/26/18 0423  TROPONINI 0.03* 0.03*   ------------------------------------------------------------------------------------------------------------------  RADIOLOGY:  Dg Chest Left Decubitus  Result Date: 04/26/2018 CLINICAL DATA:  57 year old male. Follow-up pleural effusions. Subsequent encounter. EXAM: CHEST - LEFT DECUBITUS COMPARISON:  04/25/2018 chest x-ray. FINDINGS: Left-side down decubitus view of the chest reveals left pleural fluid layers in a dependent position. IMPRESSION: Left pleural effusion layers on decubitus view. Electronically Signed   By: Genia Del M.D.   On: 04/26/2018 15:01   Dg Chest Port 1 View  Result  Date: 04/25/2018 CLINICAL DATA:  57 y/o  M; cough and shortness of breath. EXAM: PORTABLE CHEST 1 VIEW COMPARISON:  04/24/2018 chest radiograph. FINDINGS: Normal cardiac silhouette. Stable small to moderate left pleural effusion and basilar opacity. No new consolidation. No pneumothorax. No acute osseous abnormality is evident. IMPRESSION: Stable small to moderate left pleural effusion and basilar opacity which may represent associated atelectasis or pneumonia. Electronically Signed   By: Kristine Garbe M.D.   On: 04/25/2018 03:59    ASSESSMENT AND PLAN:   Active Problems:   Healthcare associated bacterial pneumonia  *Healthcare acquired pneumonia-sepsis Broad-spectrum antibiotics.  Cultures are sent. Also ordered Legionella by admitting physician. MRSA PCR negative, stop Vanco continue Zosyn. This is patient's fourth admission for similar complaints.  Had multiple work-ups in the past and ID physician had suggested to stop antibiotics. Each time he got significantly better within 1 to 2 days of receiving IV steroids and was discharged with oral tapering steroid.  Once the steroid comes off he started having the symptoms again. I spoke to ID physician and pulmonary physician extensively about the patient.  They suspect that this could be some kind of autoimmune disorder.  And suggested to do work-up for that but not to start IV steroid at this time. Patient also seems to be having some left-sided pleural effusion, pulmonology suggested IR guided thoracentesis.  *Diabetes mellitus with hyperglycemia Keep on sliding scale coverage.  *Chronic kidney disease stage III Renal function appears at baseline, continue to monitor.  Avoid nephrotoxic.  *Hypokalemia Hypomagnesemia-replace IV Replace as needed.  *Hypertension She will continue her home medication but now blood pressure was running low so we will stop the medication and start on IV fluids.    All the records are reviewed  and case discussed with Care Management/Social Workerr. Management plans discussed with the patient, family and they are in agreement.  CODE STATUS: Full code  TOTAL TIME TAKING CARE OF THIS PATIENT: 55 minutes.     POSSIBLE D/C IN 1-2 DAYS, DEPENDING ON CLINICAL CONDITION.   Vaughan Basta M.D on 04/26/2018   Between 7am to 6pm - Pager - [phone]  After 6pm go to [email or web address] - password [email or web address] [facility]  Office  [phone]  CC: Primary care physician; Katheren Shams  Note: This dictation was prepared with Dragon dictation along with smaller phrase technology. Any transcriptional errors that result from this process are unintentional.

## 2018-04-26 NOTE — Progress Notes (Addendum)
Pt. Complaining of sob, chills, Oral Temp at 1106 was 100.6. Breathing tx and Tylenol given Temp still 100.2 at 1226. BP 118/71, HR 101, SPO2 92% on R/A. Dr.Vachhani notified, received verbal orders for one time dose of Ibuprofen. 2 L via N/C applied for comfort. Will administer and continue to monitor.

## 2018-04-26 NOTE — Plan of Care (Signed)
  Problem: Education: Goal: Knowledge of General Education information will improve Description Including pain rating scale, medication(s)/side effects and non-pharmacologic comfort measures Outcome: Progressing   Problem: Clinical Measurements: Goal: Ability to maintain clinical measurements within normal limits will improve Outcome: Progressing Goal: Will remain free from infection Outcome: Progressing   Problem: Activity: Goal: Risk for activity intolerance will decrease Outcome: Progressing   Problem: Nutrition: Goal: Adequate nutrition will be maintained Outcome: Progressing   Problem: Clinical Measurements: Goal: Signs and symptoms of infection will decrease Outcome: Progressing

## 2018-04-26 NOTE — Consult Note (Signed)
NAME: Danny Cook.  DOB: February 14, 1961  MRN: [id number]  Date/Time: 04/26/2018 4:59 PM  vacchani Subjective:  REASON FOR CONSULT: fever and cough ?Danny Cook. is a 57 y.o. malewith history of HIV, CHF, chronic kidney disease(FSGN), schizophrenia  is admitted with cough, fever . Pt has had recurrent hospitalization for the same complaint. Was recently in [facility] between 10/14-10/16 and treated as Pneumonia with IV vanco+ cefepime and then sent home on cefdinir eventhough CT chest did not show any pneumonia (left pleural effusion and atelectasis) and N procal) He also received steroids which made him feel much better- He was sent home on 3 days of steroids and since stopping it he started to get fever, cough and chills, No sputum, no hemoptysis, no chest pain,  Has headaches. He currently lives in [facility] care assisted living  Prior to this was hospitalized between 9/21-9/2 6 for dizziness, nausea ,headaches and cough and found to be hypotensive, AKI on CKD and was in ICU and treated with fluids and pressors. 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 As there was no tamponade no pericardiocentesis was done. He also had high ESR and was seen by rheumatologist and work up including ANA, RH factor, ANCA were negative. HE was treated with IV antibiotics for a few days and also was given meningitis like treatment for a day  as he had become drowsy in the ICU.All the antibiotics were stopped as there was no evidence clinically of meningitis or encephalitis. CT scan of the head, MRI of the brain and MRA of the brain was negative for acute pathology. Carotid ultrasound negative. EEG negative for seizures. Mental status improved. The blood cultures were also negative. Pt was also very anxious from his underlying mental health problem.  After discharge on 9/28 he returned to the ED on 9/30  complaining of feeling anxious and sob. He did not want to live alone as it was making him anxious and so he was sent to an assisted living.    Past Medical History:  Diagnosis Date  . Anemia   . Anxiety   . Arthritis   . CHF (congestive heart failure) ([place])   . Chronic kidney disease    Renal Insufficiency Syndrome; Glomerulosclerosis 2013  . Complication of anesthesia   . Depressed   . Diabetes mellitus without complication ([place])   . High cholesterol   . HIV (human immunodeficiency virus infection) ([place])   . Hypertension   . 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 Paranoid disorder ([place])   . Schizophrenia, paranoid ([place])   . Sleep apnea     Past Surgical History:  Procedure Laterality Date  . COLONOSCOPY WITH PROPOFOL N/A 09/03/2015   Procedure: COLONOSCOPY WITH PROPOFOL;  Surgeon: Lollie Sails, MD;  Location: [facility] ENDOSCOPY;  Service: Endoscopy;  Laterality: N/A;  . DG TEETH FULL    . ESOPHAGOGASTRODUODENOSCOPY (EGD) WITH PROPOFOL N/A 07/01/2016   Procedure: ESOPHAGOGASTRODUODENOSCOPY (EGD) WITH PROPOFOL;  Surgeon: Lollie Sails, MD;  Location[facility] END[facility]COPY;  Service: Endoscopy;  Laterality: N/A;   Danny Cook Lives in assisted living Non smoker alcohol in the past  not sexually active in many months Has male friend Family History  Problem Relation Age of Onset  . Heart attack Mother   . Diabetes Mellitus II Mother   . Mental illness Mother   . CAD Mother   . Heart attack Father   .  CAD Father   . Hypertension Father    Allergies  Allergen Reactions  . Trazodone And Nefazodone   . Viread [Tenofovir Disoproxil]     Damages kidneys  . Etodolac Rash   I Current Facility-Administered Medications  Medication Dose Route Frequency Provider Last Rate Last Dose  . abacavir-dolutegravir-lamiVUDine (TRIUMEQ) [id number] MG per tablet 1 tablet  1 tablet Oral Daily Arta Silence, MD   1 tablet at 04/26/18 0838  . acetaminophen (TYLENOL) tablet 650 mg  650 mg Oral Q6H PRN  Arta Silence, MD   650 mg at 04/26/18 1135  . amiodarone (PACERONE) tablet 100 mg  100 mg Oral Daily Arta Silence, MD   100 mg at 04/26/18 0834  . aspirin EC tablet 325 mg  325 mg Oral Daily Arta Silence, MD   325 mg at 04/26/18 0834  . benzonatate (TESSALON) capsule 100 mg  100 mg Oral Q6H PRN Arta Silence, MD   100 mg at 04/26/18 0100  . bisacodyl (DULCOLAX) EC tablet 5 mg  5 mg Oral Daily PRN Arta Silence, MD      . budesonide (PULMICORT) nebulizer solution 1 mg  1 mg Nebulization BID Colin Broach A, RPH   1 mg at 04/26/18 0737  . butalbital-acetaminophen-caffeine (FIORICET, ESGIC) 50-325-40 MG per tablet 1-2 tablet  1-2 tablet Oral Q6H PRN Arta Silence, MD   2 tablet at 04/26/18 0327  . chlorpheniramine-HYDROcodone (TUSSIONEX) 10-8 MG/5ML suspension 5 mL  5 mL Oral Q12H PRN Arta Silence, MD   5 mL at 04/26/18 1414  . clonazePAM (KLONOPIN) tablet 1 mg  1 mg Oral BID PRN Vaughan Basta, MD      . diphenhydrAMINE (BENADRYL) capsule 50 mg  50 mg Oral QHS PRN Arta Silence, MD      . divalproex (DEPAKOTE ER) 24 hr tablet 500 mg  500 mg Oral BID Arta Silence, MD   500 mg at 04/26/18 0836  . enalapril (VASOTEC) tablet 2.5 mg  2.5 mg Oral Daily Arta Silence, MD   2.5 mg at 04/26/18 0838  . enoxaparin (LOVENOX) injection 40 mg  40 mg Subcutaneous Q24H Arta Silence, MD   40 mg at 04/25/18 0952  . feeding supplement (NEPRO CARB STEADY) liquid 237 mL  237 mL Oral BID BM Sridharan, Prasanna, MD      . guaiFENesin-dextromethorphan (ROBITUSSIN DM) 100-10 MG/5ML syrup 5 mL  5 mL Oral Q4H PRN Vaughan Basta, MD   5 mL at 04/26/18 1135  . iloperidone (FANAPT) tablet 12 mg  12 mg Oral BID Arta Silence, MD   12 mg at 04/26/18 0836  . insulin aspart (novoLOG) injection 0-15 Units  0-15 Units Subcutaneous TID WC Arta Silence, MD   5 Units at 04/26/18 1638  . insulin aspart (novoLOG) injection 0-5 Units   0-5 Units Subcutaneous QHS Sridharan, Prasanna, MD      . ipratropium-albuterol (DUONEB) 0.5-2.5 (3) MG/3ML nebulizer solution 3 mL  3 mL Nebulization Q4H PRN Arta Silence, MD   3 mL at 04/26/18 1229  . mirtazapine (REMERON) tablet 30 mg  30 mg Oral QHS Vaughan Basta, MD      . nitroGLYCERIN (NITROSTAT) SL tablet 0.4 mg  0.4 mg Sublingual Q5 min PRN Arta Silence, MD      . ondansetron (ZOFRAN) injection 4 mg  4 mg Intravenous Q6H PRN Arta Silence, MD      . pantoprazole (PROTONIX) EC tablet 40 mg  40 mg Oral BID Vaughan Basta, MD      . Derrill Memo  ON 04/27/2018] rosuvastatin (CRESTOR) tablet 40 mg  40 mg Oral Daily Vaughan Basta, MD      . senna-docusate (Senokot-S) tablet 1 tablet  1 tablet Oral QHS PRN Arta Silence, MD      . sucralfate (CARAFATE) tablet 1 g  1 g Oral BID Vaughan Basta, MD      . venlafaxine XR (EFFEXOR-XR) 24 hr capsule 75 mg  75 mg Oral Q breakfast Arta Silence, MD   75 mg at 04/26/18 0833  . vitamin B-12 (CYANOCOBALAMIN) tablet 1,000 mcg  1,000 mcg Oral Daily Arta Silence, MD   1,000 mcg at 04/26/18 0836     Abtx:  Anti-infectives (From admission, onward)   Start     Dose/Rate Route Frequency Ordered Stop   04/26/18 0200  piperacillin-tazobactam (ZOSYN) IVPB 3.375 g  Status:  Discontinued     3.375 g 12.5 mL/hr over 240 Minutes Intravenous Every 8 hours 04/26/18 0104 04/26/18 1605   04/25/18 1100  abacavir-dolutegravir-lamiVUDine (TRIUMEQ) 600-50-300 MG per tablet 1 tablet     1 tablet Oral Daily 04/25/18 1009     04/25/18 0615  piperacillin-tazobactam (ZOSYN) IVPB 3.375 g  Status:  Discontinued     3.375 g 12.5 mL/hr over 240 Minutes Intravenous Every 8 hours 04/25/18 0600 04/26/18 0104   04/25/18 0600  vancomycin (VANCOCIN) 1,250 mg in sodium chloride 0.9 % 250 mL IVPB  Status:  Discontinued     1,250 mg 166.7 mL/hr over 90 Minutes Intravenous Every 12 hours 04/25/18 0537 04/26/18 1218    04/25/18 0600  piperacillin-tazobactam (ZOSYN) IVPB 3.375 g  Status:  Discontinued     3.375 g 100 mL/hr over 30 Minutes Intravenous Every 8 hours 04/25/18 0537 04/25/18 0600   04/25/18 0330  cefTRIAXone (ROCEPHIN) 2 g in sodium chloride 0.9 % 100 mL IVPB  Status:  Discontinued     2 g 200 mL/hr over 30 Minutes Intravenous Every 24 hours 04/25/18 0326 04/25/18 0536   04/25/18 0330  azithromycin (ZITHROMAX) 500 mg in sodium chloride 0.9 % 250 mL IVPB  Status:  Discontinued     500 mg 250 mL/hr over 60 Minutes Intravenous Every 24 hours 04/25/18 0326 04/25/18 0536      REVIEW OF SYSTEMS:  Const:  fever chills, negative weight loss Eyes: negative diplopia or visual changes, negative eye pain ENT: negative coryza, negative sore throat Resp: cough, dry no sputum .hemoptysis, dyspnea Cards: negative for chest pain, palpitations, lower extremity edema GU: negative for frequency, dysuria and hematuria GI: Negative for abdominal pain, diarrhea, bleeding, constipation Skin: negative for rash and pruritus Heme: negative for easy bruising and gum/nose bleeding MS: negative for myalgias, arthralgias, back pain and muscle weakness Neurolo:negative for headaches, dizziness, vertigo, memory problems  Psych: negative for feelings of anxiety, depression  Endocrine: negative for thyroid, diabetes Allergy/Immunology-has some allergies to med  Objective:  VITALS:  BP (!) 73/53 (BP Location: Right Arm)   Pulse 79   Temp 98.7 F (37.1 C) (Oral)   Resp 18   Ht _0  (1.702 m)   Wt 93 kg   SpO2 98%   BMI 32.11 kg/m  PHYSICAL EXAM:  General: Alert, cooperative, no distress, appears stated age.  Head: Normocephalic, without obvious abnormality, atraumatic. Eyes: Conjunctivae clear, anicteric sclerae. Pupils are equal ENT Nares normal. No drainage or sinus tenderness. Lips, mucosa, and tongue normal. No Thrush Neck: Supple, symmetrical, no adenopathy, thyroid: non tender no carotid bruit and no  JVD. Back: No CVA tenderness. Lungs: b/l air entry,  decreased left base Heart: Regular rate and rhythm, no murmur, rub or gallop. Abdomen: Soft, non-tender,not distended. Bowel sounds normal. No masses Extremities: atraumatic, no cyanosis. No edema. No clubbing Skin: No rashes or lesions. Or bruising Lymph: Cervical, supraclavicular normal. Neurologic: Grossly non-focal Pertinent Labs Lab Results CBC    Component Value Date/Time   WBC 12.6 (H) 04/25/2018 0316   RBC 3.84 (L) 04/25/2018 0316   HGB 11.4 (L) 04/25/2018 0316   HGB 9.5 (L) 03/23/2018 2341   HCT 35.4 (L) 04/25/2018 0316   HCT 29.5 (L) 03/23/2018 2341   PLT 243 04/25/2018 0316   PLT 156 03/23/2018 2341   MCV 92.2 04/25/2018 0316   MCV 95 03/23/2018 2341   MCV 93 10/23/2014 1719   MCH 29.7 04/25/2018 0316   MCHC 32.2 04/25/2018 0316   RDW 15.7 (H) 04/25/2018 0316   RDW 14.9 03/23/2018 2341   RDW 13.0 10/23/2014 1719   LYMPHSABS 1.1 04/25/2018 0316   LYMPHSABS 2.9 03/23/2018 2341   LYMPHSABS 2.3 12/27/2012 0550   MONOABS 1.1 (H) 04/25/2018 0316   MONOABS 0.7 12/27/2012 0550   EOSABS 0.0 04/25/2018 0316   EOSABS 0.1 03/23/2018 2341   EOSABS 0.3 12/27/2012 0550   BASOSABS 0.0 04/25/2018 0316   BASOSABS 0.0 03/23/2018 2341   BASOSABS 0.1 12/27/2012 0550    CMP Latest Ref Rng & Units 04/25/2018 04/24/2018 04/14/2018  Glucose 70 - 99 mg/dL 214(H) 157(H) 154(H)  BUN 6 - 20 mg/dL 17 14 24(H)  Creatinine 0.61 - 1.24 mg/dL 1.41(H) 1.30(H) 1.27(H)  Sodium 135 - 145 mmol/L 138 140 137  Potassium 3.5 - 5.1 mmol/L 3.7 3.6 4.7  Chloride 98 - 111 mmol/L 102 103 104  CO2 22 - 32 mmol/L _0 Calcium 8.9 - 10.3 mg/dL 8.9 8.9 8.7(L)  Total Protein 6.5 - 8.1 g/dL 7.1 7.3 -  Total Bilirubin 0.3 - 1.2 mg/dL 0.8 1.0 -  Alkaline Phos 38 - 126 U/L 85 92 -  AST 15 - 41 U/L 13(L) 12(L) -  ALT 0 - 44 U/L 16 16 -      Microbiology: Recent Results (from the past 240 hour(s))  Culture, blood (routine x 2)     Status: None  (Preliminary result)   Collection Time: 04/24/18  7:06 PM  Result Value Ref Range Status   Specimen Description BLOOD LEFT HAND  Final   Special Requests   Final    BOTTLES DRAWN AEROBIC AND ANAEROBIC Blood Culture results may not be optimal due to an excessive volume of blood received in culture bottles   Culture   Final    NO GROWTH 2 DAYS Performed at [facility], [address], [place], [place] [address]    Report Status PENDING  Incomplete  Culture, blood (routine x 2)     Status: None (Preliminary result)   Collection Time: 04/24/18  7:40 PM  Result Value Ref Range Status   Specimen Description BLOOD LEFT ANTECUBITAL  Final   Special Requests   Final    BOTTLES DRAWN AEROBIC AND ANAEROBIC Blood Culture results may not be optimal due to an excessive volume of blood received in culture bottles   Culture   Final    NO GROWTH 2 DAYS Performed at [facility], [address] [place], [place] [address]    Report Status PENDING  Incomplete  Blood Culture (routine x 2)     Status: None (Preliminary result)   Collection Time: 04/25/18  3:20 AM  Result Value Ref Range Status   Specimen Description BLOOD RIGHT ANTECUBITAL  Final   Special Requests   Final    BOTTLES DRAWN AEROBIC AND ANAEROBIC Blood Culture results may not be optimal due to an excessive volume of blood received in culture bottles   Culture   Final    NO GROWTH < 24 HOURS Performed at [facility], [address] [place], [place] [address]    Report Status PENDING  Incomplete  Blood Culture (routine x 2)     Status: None (Preliminary result)   Collection Time: 04/25/18  3:54 AM  Result Value Ref Range Status   Specimen Description BLOOD BLOOD LEFT HAND  Final   Special Requests   Final    BOTTLES DRAWN AEROBIC AND ANAEROBIC Blood Culture results may not be optimal due to an excessive volume of blood received in culture bottles   Culture   Final    NO GROWTH 1 DAY Performed at  [facility], [address] [place], [place] [address]    Report Status PENDING  Incomplete  Urine culture     Status: None   Collection Time: 04/25/18  6:57 AM  Result Value Ref Range Status   Specimen Description   Final    URINE, RANDOM Performed at [facility] [address], [place], [place] [address]    Special Requests   Final    NONE Performed at [facility], [address], [place], [place] [address]    Culture   Final    NO GROWTH Performed at [facility], [place] [address], [place], [place] [address]    Report Status 04/26/2018 FINAL  Final  MRSA PCR Screening     Status: None   Collection Time: 04/26/18  1:08 AM  Result Value Ref Range Status   MRSA by PCR NEGATIVE NEGATIVE Final    Comment:        The GeneXpert MRSA Assay (FDA approved for NASAL specimens only), is one component of a comprehensive MRSA colonization surveillance program. It is not intended to diagnose MRSA infection nor to guide or monitor treatment for MRSA infections. Performed at [facility], [place], [place] [address]    IMAGING RESULTS: ? I  Impression/Recommendation  57 y.o. malewith history of HIV, CHF, chronic kidney disease(FSGN), schizophrenia  is admitted with cough, fever . Pt has had recurrent hospitalization for the same complaint.  ?Recurrent visits to ED and hospitalization with constellation of symptoms like cough, Fever SOB, headache, anxiety Not clear what the underlying pathology is? He has following positive  findings Anemia since Sept 2019 High ESR > 100 Pericardial effusion Left pleural effusion  ? 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   Afib on amiodarone- if cough present- may consider changing it ? _ HIV- well controlled on Triumeq- VL < 20 and cd4 754 >__________________________________________________ Discussed with patient, requesting provider

## 2018-04-27 ENCOUNTER — Inpatient Hospital Stay: Payer: Medicare Other

## 2018-04-27 DIAGNOSIS — D3500 Benign neoplasm of unspecified adrenal gland: Secondary | ICD-10-CM

## 2018-04-27 DIAGNOSIS — J90 Pleural effusion, not elsewhere classified: Secondary | ICD-10-CM

## 2018-04-27 DIAGNOSIS — R51 Headache: Secondary | ICD-10-CM

## 2018-04-27 DIAGNOSIS — N189 Chronic kidney disease, unspecified: Secondary | ICD-10-CM

## 2018-04-27 DIAGNOSIS — N179 Acute kidney failure, unspecified: Secondary | ICD-10-CM

## 2018-04-27 DIAGNOSIS — R05 Cough: Secondary | ICD-10-CM

## 2018-04-27 DIAGNOSIS — D649 Anemia, unspecified: Secondary | ICD-10-CM

## 2018-04-27 DIAGNOSIS — I313 Pericardial effusion (noninflammatory): Secondary | ICD-10-CM

## 2018-04-27 DIAGNOSIS — I509 Heart failure, unspecified: Secondary | ICD-10-CM

## 2018-04-27 DIAGNOSIS — I4891 Unspecified atrial fibrillation: Secondary | ICD-10-CM

## 2018-04-27 DIAGNOSIS — R509 Fever, unspecified: Secondary | ICD-10-CM

## 2018-04-27 DIAGNOSIS — F419 Anxiety disorder, unspecified: Secondary | ICD-10-CM

## 2018-04-27 DIAGNOSIS — Z79899 Other long term (current) drug therapy: Secondary | ICD-10-CM

## 2018-04-27 DIAGNOSIS — N051 Unspecified nephritic syndrome with focal and segmental glomerular lesions: Secondary | ICD-10-CM | POA: Diagnosis present

## 2018-04-27 DIAGNOSIS — R0602 Shortness of breath: Secondary | ICD-10-CM

## 2018-04-27 DIAGNOSIS — Z21 Asymptomatic human immunodeficiency virus [HIV] infection status: Secondary | ICD-10-CM

## 2018-04-27 DIAGNOSIS — I712 Thoracic aortic aneurysm, without rupture: Secondary | ICD-10-CM

## 2018-04-27 DIAGNOSIS — F209 Schizophrenia, unspecified: Secondary | ICD-10-CM

## 2018-04-27 DIAGNOSIS — J9811 Atelectasis: Secondary | ICD-10-CM

## 2018-04-27 LAB — GLUCOSE, CAPILLARY
GLUCOSE-CAPILLARY: 170 mg/dL — AB (ref 70–99)
Glucose-Capillary: 150 mg/dL — ABNORMAL HIGH (ref 70–99)
Glucose-Capillary: 164 mg/dL — ABNORMAL HIGH (ref 70–99)
Glucose-Capillary: 162 mg/dL — ABNORMAL HIGH (ref 70–99)

## 2018-04-27 LAB — BASIC METABOLIC PANEL
Anion gap: 9 (ref 5–15)
BUN: 17 mg/dL (ref 6–20)
CHLORIDE: 105 mmol/L (ref 98–111)
CO2: 26 mmol/L (ref 22–32)
Calcium: 7.9 mg/dL — ABNORMAL LOW (ref 8.9–10.3)
Creatinine, Ser: 1.54 mg/dL — ABNORMAL HIGH (ref 0.61–1.24)
GFR calc non Af Amer: 48 mL/min — ABNORMAL LOW (ref >60)
GFR, EST AFRICAN AMERICAN: 55 mL/min — AB (ref >60)
Glucose, Bld: 238 mg/dL — ABNORMAL HIGH (ref 70–99)
POTASSIUM: 3.5 mmol/L (ref 3.5–5.1)
SODIUM: 140 mmol/L (ref 135–145)

## 2018-04-27 LAB — CBC
HEMATOCRIT: 27.9 % — AB (ref 39.0–52.0)
HEMOGLOBIN: 8.7 g/dL — AB (ref 13.0–17.0)
MCH: 29.4 pg (ref 26.0–34.0)
MCHC: 31.2 g/dL (ref 30.0–36.0)
MCV: 94.3 fL (ref 80.0–100.0)
Platelets: 151 10*3/uL (ref 150–400)
RBC: 2.96 MIL/uL — AB (ref 4.22–5.81)
RDW: 15.6 % — ABNORMAL HIGH (ref 11.5–15.5)
WBC: 15.1 10*3/uL — ABNORMAL HIGH (ref 4.0–10.5)
nRBC: 0 % (ref 0.0–0.2)

## 2018-04-27 LAB — BODY FLUID CELL COUNT WITH DIFFERENTIAL
Eos, Fluid: 0 %
LYMPHS FL: 11 %
MONOCYTE-MACROPHAGE-SEROUS FLUID: 23 %
NEUTROPHIL FLUID: 66 %
Other Cells, Fluid: 0 %
WBC FLUID: 229 uL

## 2018-04-27 LAB — VALPROIC ACID LEVEL, FREE: VALPROIC ACID FREE: 7 ug/mL (ref 6.0–22.0)

## 2018-04-27 LAB — LACTATE DEHYDROGENASE, PLEURAL OR PERITONEAL FLUID: LD FL: 105 U/L — AB (ref 3–23)

## 2018-04-27 LAB — AMYLASE, PLEURAL OR PERITONEAL FLUID: AMYLASE FL: 37 U/L

## 2018-04-27 LAB — ANGIOTENSIN CONVERTING ENZYME: Angiotensin-Converting Enzyme: <15 U/L (ref 14–82)

## 2018-04-27 LAB — FERRITIN: Ferritin: 250 ng/mL (ref 24–336)

## 2018-04-27 LAB — CORTISOL-AM, BLOOD: CORTISOL - AM: 12.2 ug/dL (ref 6.7–22.6)

## 2018-04-27 LAB — PROCALCITONIN: Procalcitonin: 0.51 ng/mL

## 2018-04-27 LAB — PROTEIN / CREATININE RATIO, URINE
Creatinine, Urine: 175 mg/dL
Protein Creatinine Ratio: 1.75 mg/mg{Cre} — ABNORMAL HIGH (ref 0.00–0.15)
Total Protein, Urine: 307 mg/dL

## 2018-04-27 LAB — PROTEIN, PLEURAL OR PERITONEAL FLUID: TOTAL PROTEIN, FLUID: 3.7 g/dL

## 2018-04-27 LAB — LEGIONELLA PNEUMOPHILA SEROGP 1 UR AG: L. pneumophila Serogp 1 Ur Ag: NEGATIVE

## 2018-04-27 MED ORDER — LOSARTAN POTASSIUM 25 MG PO TABS
25.0000 mg | ORAL_TABLET | Freq: Every day | ORAL | Status: DC
  Administered 2018-04-28 – 2018-05-03 (×6): 25 mg via ORAL
  Filled 2018-04-27 (×6): qty 1

## 2018-04-27 MED ORDER — OXYCODONE HCL 5 MG PO TABS
5.0000 mg | ORAL_TABLET | Freq: Four times a day (QID) | ORAL | Status: DC | PRN
  Administered 2018-04-27: 5 mg via ORAL
  Filled 2018-04-27: qty 1

## 2018-04-27 MED ORDER — INSULIN ASPART 100 UNIT/ML ~~LOC~~ SOLN
4.0000 [IU] | Freq: Three times a day (TID) | SUBCUTANEOUS | Status: DC
  Administered 2018-04-27 – 2018-05-03 (×14): 4 [IU] via SUBCUTANEOUS
  Filled 2018-04-27 (×14): qty 1

## 2018-04-27 MED ORDER — IBUPROFEN 200 MG PO TABS
200.0000 mg | ORAL_TABLET | Freq: Once | ORAL | Status: DC
  Filled 2018-04-27: qty 1

## 2018-04-27 MED ORDER — CARVEDILOL 6.25 MG PO TABS: 6.2500 mg | ORAL_TABLET | Freq: Two times a day (BID) | ORAL | Status: DC

## 2018-04-27 NOTE — Consult Note (Signed)
[facility]  CONSULT NOTE    Date: 04/27/2018                  Patient Name:  Danny Cook.  MRN: [id number]  DOB: 11-18-1960  Age / Sex: 57 y.o., male         PCP: Katheren Shams                 Service Requesting Consult: Dr. Anselm Jungling                 Reason for Consult: Chronic Kidney Disease stage III/ FSGS            History of Present Illness: [name]. is a 57 y.o.  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   Patient was admitted to [facility] on 04/25/2018 for Hypoxia [R09.02] Fever, unspecified fever cause [R50.9] Community acquired pneumonia of left lung, unspecified part of lung [J18.9] Sepsis, due to unspecified organism, unspecified whether acute organ dysfunction present [facility]) [A41.9]  Patient states he has been having shortness of breath and coughing but denies hemoptysis.   Patient states he has pain in his back and does endorse use of nonsteroidal anti-inflammatory agents.   Patient claims to be taking all his medications as prescribed. States blood pressure and blood glucose levels have been at goal.   Previous admission from 9/24 to 9/28 for pneumonia with pericarditis treated with steroids. Then again from 10/14 to 10/16. Patient states after his steroid taper resolves, then his symptoms return.   Endorses second hand smoke.   Left thoracentesis earlier today with 1.2 liters removed.   Medications: Outpatient medications: Medications Prior to Admission  Medication Sig Dispense Refill Last Dose  . Abacavir-Dolutegravir-Lamivud (TRIUMEQ) 600-50-300 MG TABS Take 1 tablet by mouth daily.   04/24/2018 at Unknown time  . albuterol (PROVENTIL HFA;VENTOLIN HFA) 108 (90 Base) MCG/ACT inhaler Inhale 2  puffs into the lungs every 6 (six) hours as needed for wheezing or shortness of breath. 1 Inhaler 2 PRN at PRN  . amiodarone (PACERONE) 100 MG tablet Take 1 tablet (100 mg total) by mouth daily. 30 tablet 0 04/24/2018 at Unknown time  . aspirin EC 325 MG tablet Take 325 mg by mouth daily.   04/24/2018 at Unknown time  . benzonatate (TESSALON PERLES) 100 MG capsule Take 1 capsule (100 mg total) by mouth every 6 (six) hours as needed for cough. 30 capsule 0 PRN at PRN  . butalbital-acetaminophen-caffeine (FIORICET, ESGIC) 50-325-40 MG tablet Take 1-2 tablets by mouth every 6 (six) hours as needed. (Patient taking differently: Take 1-2 tablets by mouth every 6 (six) hours as needed for migraine. ) 20 tablet 0 PRN at PRN  . chlorpheniramine-HYDROcodone (TUSSIONEX) 10-8 MG/5ML SUER Take 5 mLs by mouth every 12 (twelve) hours as needed for cough. 115 mL 0 PRN at PRN  . clonazePAM (KLONOPIN) 1 MG tablet Take 1 mg by mouth 2 (two) times daily. Take 1 mg in the morning, 0.5 mg mid-day and 1 mg in the evening.   PRN at PRN  . cyanocobalamin 1000 MCG tablet Take 1,000 mcg by mouth daily.   Past Week at Unknown time  . diphenhydrAMINE (BENADRYL) 50 MG capsule Take 1 capsule (50 mg total) by mouth at bedtime as needed for sleep. 30 capsule  0 PRN at PRN  . divalproex (DEPAKOTE ER) 250 MG 24 hr tablet Take 500 mg by mouth 2 (two) times daily.    04/24/2018 at Unknown time  . doxycycline (VIBRAMYCIN) 100 MG capsule Take 1 capsule (100 mg total) by mouth 2 (two) times daily. 20 capsule 0 UNKNOWN at UNKNOWN  . enalapril (VASOTEC) 2.5 MG tablet Take 1 tablet (2.5 mg total) by mouth daily. 30 tablet 0 04/24/2018 at Unknown time  . FANAPT 12 MG TABS Take 12 mg by mouth 2 (two) times daily.    04/24/2018 at Unknown time  . fluticasone (FLOVENT HFA) 220 MCG/ACT inhaler Inhale 2 puffs into the lungs 2 (two) times daily. Rinse out mouth afterwards 1 Inhaler 12 04/24/2018 at Unknown time  . Insulin Degludec (TRESIBA FLEXTOUCH)  200 UNIT/ML SOPN Inject into the skin daily.   Past Week at Unknown time  . liraglutide (VICTOZA) 18 MG/3ML SOPN Inject 1.8 mg into the skin See admin instructions. Daily if blood sugar is greater than 85; patient uses tresiba if blood sugar is less than 85   04/24/2018 at Unknown time  . metFORMIN (GLUCOPHAGE) 1000 MG tablet Take 1,000 mg by mouth 2 (two) times daily with a meal.   04/24/2018 at Unknown time  . mirtazapine (REMERON) 30 MG tablet Take 30 mg by mouth at bedtime.   04/24/2018 at Unknown time  . nitroGLYCERIN (NITROSTAT) 0.4 MG SL tablet Place 0.4 mg under the tongue every 5 (five) minutes as needed for chest pain.    PRN at PRN  . Nutritional Supplements (FEEDING SUPPLEMENT, NEPRO CARB STEADY,) LIQD Take 237 mLs by mouth 2 (two) times daily between meals. 60 Can 0   . omeprazole (PRILOSEC) 40 MG capsule Take 40 mg by mouth 2 (two) times daily.   04/24/2018 at Unknown time  . promethazine (PHENERGAN) 6.25 MG/5ML syrup Take 6.25 mg by mouth every 6 (six) hours as needed for nausea or vomiting.   PRN at PRN  . rosuvastatin (CRESTOR) 20 MG tablet Take 40 mg by mouth daily.    04/24/2018 at Unknown time  . sucralfate (CARAFATE) 1 g tablet Take 1 g by mouth 2 (two) times daily.   04/24/2018 at Unknown time  . triamcinolone cream (KENALOG) 0.1 % Apply 1 application topically 2 (two) times daily as needed. (Patient taking differently: Apply 1 application topically 2 (two) times daily as needed (for rash). ) 30 g 0 PRN at PRN  . venlafaxine XR (EFFEXOR-XR) 75 MG 24 hr capsule Take 1 capsule (75 mg total) by mouth daily with breakfast. 30 capsule 0 04/24/2018 at Unknown time  . predniSONE (DELTASONE) 10 MG tablet 4 tabs po daily for 3 dyays (Patient not taking: Reported on 04/25/2018) 12 tablet 0 Completed Course at Unknown time    Current medications: Current Facility-Administered Medications  Medication Dose Route Frequency Provider Last Rate Last Dose  . abacavir-dolutegravir-lamiVUDine  (TRIUMEQ) 371-69-678 MG per tablet 1 tablet  1 tablet Oral Daily Arta Silence, MD   1 tablet at 04/27/18 1048  . acetaminophen (TYLENOL) tablet 650 mg  650 mg Oral Q6H PRN Arta Silence, MD   650 mg at 04/27/18 1048  . amiodarone (PACERONE) tablet 100 mg  100 mg Oral Daily Arta Silence, MD   100 mg at 04/27/18 0828  . aspirin EC tablet 325 mg  325 mg Oral Daily Arta Silence, MD   325 mg at 04/27/18 0828  . benzonatate (TESSALON) capsule 100 mg  100 mg Oral Q6H PRN  Arta Silence, MD   100 mg at 04/26/18 2109  . bisacodyl (DULCOLAX) EC tablet 5 mg  5 mg Oral Daily PRN Arta Silence, MD      . budesonide (PULMICORT) nebulizer solution 1 mg  1 mg Nebulization BID Colin Broach A, RPH   0.5 mg at 04/27/18 0753  . butalbital-acetaminophen-caffeine (FIORICET, ESGIC) 50-325-40 MG per tablet 1-2 tablet  1-2 tablet Oral Q6H PRN Arta Silence, MD   2 tablet at 04/26/18 0327  . chlorpheniramine-HYDROcodone (TUSSIONEX) 10-8 MG/5ML suspension 5 mL  5 mL Oral Q12H PRN Arta Silence, MD   5 mL at 04/27/18 0828  . clonazePAM (KLONOPIN) tablet 1 mg  1 mg Oral BID PRN Vaughan Basta, MD   1 mg at 04/27/18 0845  . diphenhydrAMINE (BENADRYL) capsule 50 mg  50 mg Oral QHS PRN Arta Silence, MD   50 mg at 04/27/18 0024  . divalproex (DEPAKOTE ER) 24 hr tablet 500 mg  500 mg Oral BID Arta Silence, MD   500 mg at 04/27/18 0827  . enoxaparin (LOVENOX) injection 40 mg  40 mg Subcutaneous Q24H Arta Silence, MD   40 mg at 04/27/18 1049  . feeding supplement (NEPRO CARB STEADY) liquid 237 mL  237 mL Oral BID BM Sridharan, Prasanna, MD      . guaiFENesin-dextromethorphan (ROBITUSSIN DM) 100-10 MG/5ML syrup 5 mL  5 mL Oral Q4H PRN Vaughan Basta, MD   5 mL at 04/27/18 0117  . ibuprofen (ADVIL,MOTRIN) tablet 200 mg  200 mg Oral Once Vaughan Basta, MD      . iloperidone (FANAPT) tablet 12 mg  12 mg Oral BID Arta Silence, MD    12 mg at 04/27/18 0827  . insulin aspart (novoLOG) injection 0-15 Units  0-15 Units Subcutaneous TID WC Arta Silence, MD   3 Units at 04/27/18 1231  . insulin aspart (novoLOG) injection 0-5 Units  0-5 Units Subcutaneous QHS Arta Silence, MD   2 Units at 04/26/18 2110  . insulin aspart (novoLOG) injection 4 Units  4 Units Subcutaneous TID WC Vaughan Basta, MD      . ipratropium-albuterol (DUONEB) 0.5-2.5 (3) MG/3ML nebulizer solution 3 mL  3 mL Nebulization Q4H PRN Arta Silence, MD   3 mL at 04/26/18 1229  . mirtazapine (REMERON) tablet 30 mg  30 mg Oral QHS Vaughan Basta, MD   30 mg at 04/26/18 2108  . nitroGLYCERIN (NITROSTAT) SL tablet 0.4 mg  0.4 mg Sublingual Q5 min PRN Arta Silence, MD      . ondansetron (ZOFRAN) injection 4 mg  4 mg Intravenous Q6H PRN Arta Silence, MD      . oxyCODONE (Oxy IR/ROXICODONE) immediate release tablet 5 mg  5 mg Oral Q6H PRN Vaughan Basta, MD      . pantoprazole (PROTONIX) EC tablet 40 mg  40 mg Oral BID Vaughan Basta, MD   40 mg at 04/27/18 0828  . rosuvastatin (CRESTOR) tablet 40 mg  40 mg Oral Daily Vaughan Basta, MD   40 mg at 04/27/18 0827  . senna-docusate (Senokot-S) tablet 1 tablet  1 tablet Oral QHS PRN Arta Silence, MD      . sucralfate (CARAFATE) tablet 1 g  1 g Oral BID Vaughan Basta, MD   1 g at 04/27/18 1248  . venlafaxine XR (EFFEXOR-XR) 24 hr capsule 75 mg  75 mg Oral Q breakfast Arta Silence, MD   75 mg at 04/27/18 0828  . vitamin B-12 (CYANOCOBALAMIN) tablet 1,000 mcg  1,000 mcg Oral Daily Sridharan,  Aliene Altes, MD   1,000 mcg at 04/27/18 0831      Allergies: Allergies  Allergen Reactions  . Trazodone And Nefazodone   . Viread [Tenofovir Disoproxil]     Damages kidneys  . Etodolac Rash      Past Medical History: Past Medical History:  Diagnosis Date  . Anemia   . Anxiety   . Arthritis   . CHF (congestive heart failure) ([place])   .  Chronic kidney disease    Renal Insufficiency Syndrome; Glomerulosclerosis 2013  . Complication of anesthesia   . Depressed   . Diabetes mellitus without complication ([place])   . High cholesterol   . HIV (human immunodeficiency virus infection) ([place])   . Hypertension   . 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 Paranoid disorder ([place])   . Schizophrenia, paranoid ([place])   . Sleep apnea      Past Surgical History: Past Surgical History:  Procedure Laterality Date  . COLONOSCOPY WITH PROPOFOL N/A 09/03/2015   Procedure: COLONOSCOPY WITH PROPOFOL;  Surgeon: Lollie Sails, MD;  Location: [facility] ENDOSCOPY;  Service: Endoscopy;  Laterality: N/A;  . DG TEETH FULL    . ESOPHAGOGASTRODUODENOSCOPY (EGD) WITH PROPOFOL N/A 07/01/2016   Procedure: ESOPHAGOGASTRODUODENOSCOPY (EGD) WITH PROPOFOL;  Surgeon: Lollie Sails, MD;  Location: [facility] ENDOSCOPY;  Service: Endoscopy;  Laterality: N/A;     Family History: Family History  Problem Relation Age of Onset  . Heart attack Mother   . Diabetes Mellitus II Mother   . Mental illness Mother   . CAD Mother   . Heart attack Father   . CAD Father   . Hypertension Father      Social History: Social History   Socioeconomic History  . Marital status: Single    Spouse name: Not on file  . Number of children: Not on file  . Years of education: 20  . Highest education level: GED or equivalent  Occupational History  . Not on file  Social Needs  . Financial resource strain: Not hard at all  . Food insecurity:    Worry: Sometimes true    Inability: Never true  . Transportation needs:    Medical: No    Non-medical: No  Tobacco Use  . Smoking status: Never Smoker  . Smokeless tobacco: Never Used  Substance and Sexual Activity  . Alcohol use: No  . Drug use: No  . Sexual activity: Never  Lifestyle  . Physical activity:    Days per week: 0 days    Minutes per session: 0 min  . Stress: To some extent  Relationships  . Social connections:    Talks on  phone: More than three times a week    Gets together: Three times a week    Attends religious service: Never    Active member of club or organization: No    Attends meetings of clubs or organizations: Never    Relationship status: Never married  . Intimate partner violence:    Fear of current or ex partner: Patient refused    Emotionally abused: Patient refused    Physically abused: Patient refused    Forced sexual activity: Patient refused  Other Topics Concern  . Not on file  Social History Narrative  . Not on file     Review of Systems: Review of Systems  Constitutional: Positive for chills, diaphoresis, fever, malaise/fatigue and weight loss.  HENT: Negative for congestion, ear discharge, ear pain, hearing loss, nosebleeds, sinus pain, sore throat and tinnitus.  Eyes: Negative.  Negative for blurred vision, double vision, photophobia, pain, discharge and redness.  Respiratory: Positive for cough, sputum production, shortness of breath and wheezing. Negative for hemoptysis and stridor.   Cardiovascular: Negative.  Negative for chest pain, palpitations, orthopnea, claudication, leg swelling and PND.  Gastrointestinal: Negative.  Negative for abdominal pain, blood in stool, constipation, diarrhea, heartburn, melena, nausea and vomiting.  Genitourinary: Negative.  Negative for dysuria, flank pain, frequency, hematuria and urgency.  Musculoskeletal: Negative.  Negative for back pain, falls, joint pain, myalgias and neck pain.  Skin: Negative for itching and rash.  Neurological: Negative.  Negative for dizziness, tingling, tremors, sensory change, speech change, focal weakness, seizures, loss of consciousness, weakness and headaches.  Endo/Heme/Allergies: Negative.  Negative for environmental allergies and polydipsia. Does not bruise/bleed easily.  Psychiatric/Behavioral: Negative.  Negative for depression, hallucinations, memory loss, substance abuse and suicidal ideas. The patient is  not nervous/anxious and does not have insomnia.     Vital Signs: Blood pressure (!) 107/41, pulse (!) 103, temperature 98.7 F (37.1 C), temperature source Oral, resp. rate 18, height 5\' 7"  (1.702 m), weight 93 kg, SpO2 94 %.  Weight trends: Filed Weights   04/26/18 0003  Weight: 93 kg    Physical Exam: General: NAD,   Head: Normocephalic, atraumatic. Moist oral mucosal membranes  Eyes: Anicteric, PERRL  Neck: Supple, trachea midline  Lungs:  Left diminished breath sounds.   Heart: Regular rate and rhythm  Abdomen:  Soft, nontender,   Extremities:  no  peripheral edema.  Neurologic: Nonfocal, moving all four extremities  Skin: No lesions        Lab results: Basic Metabolic Panel: Recent Labs  Lab 04/25/18 0316 04/25/18 0548 04/26/18 0423 04/26/18 0427 04/26/18 1811 04/27/18 0359  NA 138  --   --   --  137 140  K 3.7  --   --   --  3.8 3.5  CL 102  --   --   --  108 105  CO2 25  --   --   --  22 26  GLUCOSE 214*  --   --   --  246* 238*  BUN 17  --   --   --  16 17  CREATININE 1.41*  --   --   --  1.59* 1.54*  CALCIUM 8.9  --   --   --  8.0* 7.9*  MG  --  0.8* 2.1  --   --   --   PHOS  --  1.8*  --  2.3*  --   --     Liver Function Tests: Recent Labs  Lab 04/24/18 1444 04/25/18 0316  AST 12* 13*  ALT 16 16  ALKPHOS 92 85  BILITOT 1.0 0.8  PROT 7.3 7.1  ALBUMIN 3.3* 3.3*   No results for input(s): LIPASE, AMYLASE in the last 168 hours. No results for input(s): AMMONIA in the last 168 hours.  CBC: Recent Labs  Lab 04/24/18 1444 04/25/18 0316 04/26/18 1811 04/27/18 0359  WBC 12.8* 12.6* 14.1* 15.1*  NEUTROABS 9.3* 10.2*  --   --   HGB 11.7* 11.4* 9.3* 8.7*  HCT 37.2* 35.4* 29.4* 27.9*  MCV 93.2 92.2 93.3 94.3  PLT 237 243 167 151    Cardiac Enzymes: Recent Labs  Lab 04/25/18 0316 04/25/18 0805 04/26/18 0423  TROPONINI 0.05* 0.03* 0.03*    BNP: Invalid input(s): POCBNP  CBG: Recent Labs  Lab 04/26/18 1126 04/26/18 1635  04/26/18 2047  04/27/18 0739 04/27/18 1135  GLUCAP 176* 239* 206* 150* 164*    Microbiology: Results for orders placed or performed during the hospital encounter of 04/25/18  Blood Culture (routine x 2)     Status: None (Preliminary result)   Collection Time: 04/25/18  3:20 AM  Result Value Ref Range Status   Specimen Description BLOOD RIGHT ANTECUBITAL  Final   Special Requests   Final    BOTTLES DRAWN AEROBIC AND ANAEROBIC Blood Culture results may not be optimal due to an excessive volume of blood received in culture bottles   Culture   Final    NO GROWTH 2 DAYS Performed at [facility], [address], [place], [place] [address]    Report Status PENDING  Incomplete  Blood Culture (routine x 2)     Status: None (Preliminary result)   Collection Time: 04/25/18  3:54 AM  Result Value Ref Range Status   Specimen Description BLOOD BLOOD LEFT HAND  Final   Special Requests   Final    BOTTLES DRAWN AEROBIC AND ANAEROBIC Blood Culture results may not be optimal due to an excessive volume of blood received in culture bottles   Culture   Final    NO GROWTH 2 DAYS Performed at [facility], [address] [place], [place] [address]    Report Status PENDING  Incomplete  Urine culture     Status: None   Collection Time: 04/25/18  6:57 AM  Result Value Ref Range Status   Specimen Description   Final    URINE, RANDOM Performed at [facility], [address], [place], [place] [address]    Special Requests   Final    NONE Performed at [facility], [address], [place], [place] [address]    Culture   Final    NO GROWTH Performed at [facility], [place] [address] [place], [place] [address]    Report Status 04/26/2018 FINAL  Final  MRSA PCR Screening     Status: None   Collection Time: 04/26/18  1:08 AM  Result Value Ref Range Status   MRSA by PCR NEGATIVE NEGATIVE Final    Comment:        The GeneXpert MRSA Assay (FDA approved  for NASAL specimens only), is one component of a comprehensive MRSA colonization surveillance program. It is not intended to diagnose MRSA infection nor to guide or monitor treatment for MRSA infections. Performed at [facility], [address] [place], [place] [address]   Body fluid culture     Status: None (Preliminary result)   Collection Time: 04/27/18  9:50 AM  Result Value Ref Range Status   Specimen Description   Final    PLEURAL Performed at [facility], [address], [place], [place] [address]    Special Requests   Final    NONE Performed at [facility], [place] [place], [place] [address]    Gram Stain   Final    RARE WBC PRESENT, PREDOMINANTLY PMN NO ORGANISMS SEEN Performed at [facility], [place] [address] [place], [place] [address]    Culture PENDING  Incomplete   Report Status PENDING  Incomplete    Coagulation Studies: No results for input(s): LABPROT, INR in the last 72 hours.  Urinalysis: Recent Labs    04/25/18 0657  COLORURINE YELLOW*  LABSPEC 1.012  PHURINE 5.0  GLUCOSEU NEGATIVE  [place]  100*  NITRITE NEGATIVE  LEUKOCYTESUR NEGATIVE      Imaging: Dg Chest Left Decubitus  Result Date: 04/26/2018 CLINICAL DATA:  57 year old male. Follow-up pleural effusions. Subsequent encounter. EXAM: CHEST - LEFT DECUBITUS COMPARISON:  04/25/2018 chest x-ray. FINDINGS: Left-side down decubitus view of the chest reveals left pleural fluid layers in a dependent position. IMPRESSION: Left pleural effusion layers on decubitus view. Electronically Signed   By: Genia Del M.D.   On: 04/26/2018 15:01   Dg Chest Port 1 View  Result Date: 04/27/2018 CLINICAL DATA:  Status post thoracentesis. EXAM: PORTABLE CHEST 1 VIEW COMPARISON:  Ultrasound 04/27/2018.  Chest x-ray 04/26/2018. FINDINGS: Interval left-sided thoracentesis. Decrease in left-sided pleural  effusion. Left base atelectasis. No pneumothorax. Stable cardiomegaly. IMPRESSION: 1. Interval left thoracentesis with decrease in left-sided pleural effusion. Left base atelectasis. No pneumothorax. 2.  Stable cardiomegaly. Electronically Signed   By: Marcello Moores  Register   On: 04/27/2018 10:42   US Thoracentesis Asp Pleural Space W/img Guide  Result Date: 04/27/2018 INDICATION: Left pleural effusion EXAM: ULTRASOUND GUIDED LEFT THORACENTESIS MEDICATIONS: None. COMPLICATIONS: None immediate. PROCEDURE: An ultrasound guided thoracentesis was thoroughly discussed with the patient and questions answered. The benefits, risks, alternatives and complications were also discussed. The patient understands and wishes to proceed with the procedure. Written consent was obtained. Ultrasound was performed to localize and mark an adequate pocket of fluid in the left chest. The area was then prepped and draped in the normal sterile fashion. 1% Lidocaine was used for local anesthesia. Under ultrasound guidance a 6 Fr Safe-T-Centesis catheter was introduced. Thoracentesis was performed. The catheter was removed and a dressing applied. FINDINGS: A total of approximately 1200 cc of clear yellow fluid was removed. Samples were sent to the laboratory as requested by the clinical team. IMPRESSION: Successful ultrasound guided left thoracentesis yielding 1200 cc of pleural fluid. Electronically Signed   By: Marybelle Killings M.D.   On: 04/27/2018 10:04      Assessment & Plan: Mr. Cesario Weidinger. is a 57 y.o.  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   1. 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 However unclear if this is  due to a secondary cause. FSGS can be due to HIV, NSAIDs, obesity etc.  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  Baseline proteinuria is subnephrotic at 1.1 grams on 02/17/18.  Baseline creatinine of 1.2-1.4 Enalapril 2.5 mg daily - change to ARB due to cough, losartan 25mg  daily.   2. Hypertension:  Blood pressure currently at goal. Enalapril at home. Change to losartan as above.   3. Anemia with chronic kidney disease: hemoglobin 8.7. Normocytic.   4. Pneumonia: with fever and white count. Status post thoracentesis on 10/29.  Will talk to pulmonary about possibility of amiodarone toxicity.  - appreciate ID input.   LOS: 2 Antavius Sperbeck 10/29/20194:22 PM

## 2018-04-27 NOTE — Progress Notes (Signed)
Inpatient Diabetes Program Recommendations  AACE/ADA: New Consensus Statement on Inpatient Glycemic Control (2015)  Target Ranges:  Prepandial:   less than 140 mg/dL      Peak postprandial:   less than 180 mg/dL (1-2 hours)      Critically ill patients:  140 - 180 mg/dL   Results for Danny Cook, Danny Cook (MRN [id number]) as of 04/27/2018 09:10  Ref. Range 04/26/2018 07:35 04/26/2018 11:26 04/26/2018 16:35 04/26/2018 20:47  Glucose-Capillary Latest Ref Range: 70 - 99 mg/dL 174 (H)  3 units NOVOLOG  176 (H)  3 units NOVOLOG  239 (H)  5 units NOVOLOG  206 (H)  2 units NOVOLOG    Results for Danny Cook, Danny Cook (MRN [id number]) as of 04/27/2018 09:10  Ref. Range 04/27/2018 07:39  Glucose-Capillary Latest Ref Range: 70 - 99 mg/dL 150 (H)  2 units NOVOLOG     Admit with: Pneumonia  History: DM, CKD, HIV  Home DM Meds: Tresiba (insulin degludec U200) 80 units Daily (see PCP note from Care Everywhere from 04/19/2018)       Victoza 1.8 mg Daily       Metformin 1000 mg BID  Current Orders: Novolog Moderate Correction Scale/ SSI (0-15 units) TID AC + HS      MD- Please consider the following in-hospital insulin adjustments:  Start Novolog Meal Coverage: Novolog 4 units TID with meals   (Please add the following Hold Parameters: Hold if pt eats <50% of meal, Hold if pt NPO)     --Will follow patient during hospitalization--  Wyn Quaker RN, MSN, CDE Diabetes Coordinator Inpatient Glycemic Control Team Team Pager: [phone] (8a-5p)

## 2018-04-27 NOTE — Progress Notes (Signed)
[facility] [place] NAME: Danny Cook    MR#:  [id number]  DATE OF BIRTH:  1961-06-25  SUBJECTIVE:  CHIEF COMPLAINT:   Chief Complaint  Patient presents with  . Cough   Came with cough and fever. This is patient's fourth admission with similar complaints.  Every time he gets better with use of IV steroids and once steroid tapers off he starts having fever and cough.  Still have fever and cough now.  REVIEW OF SYSTEMS:  CONSTITUTIONAL: No fever,have fatigue or weakness.  EYES: No blurred or double vision.  EARS, NOSE, AND THROAT: No tinnitus or ear pain.  RESPIRATORY: Have cough, shortness of breath, no wheezing or hemoptysis.  CARDIOVASCULAR: No chest pain, orthopnea, edema.  GASTROINTESTINAL: No nausea, vomiting, diarrhea or abdominal pain.  GENITOURINARY: No dysuria, hematuria.  ENDOCRINE: No polyuria, nocturia,  HEMATOLOGY: No anemia, easy bruising or bleeding SKIN: No rash or lesion. MUSCULOSKELETAL: No joint pain or arthritis.   NEUROLOGIC: No tingling, numbness, weakness.  PSYCHIATRY: No anxiety or depression.   ROS  DRUG ALLERGIES:   Allergies  Allergen Reactions  . Trazodone And Nefazodone   . Viread [Tenofovir Disoproxil]     Damages kidneys  . Etodolac Rash    VITALS:  Blood pressure (!) 148/70, pulse (!) 113, temperature (!) 103.1 F (39.5 C), temperature source Oral, resp. rate (!) 32, height 5\' 7"  (1.702 m), weight 93 kg, SpO2 96 %.  PHYSICAL EXAMINATION:  GENERAL:  57 y.o.-year-old patient lying in the bed with no acute distress.  EYES: Pupils equal, round, reactive to light and accommodation. No scleral icterus. Extraocular muscles intact.  HEENT: Head atraumatic, normocephalic. Oropharynx and nasopharynx clear.  NECK:  Supple, no jugular venous distention. No thyroid enlargement, no tenderness.  LUNGS: Normal breath sounds bilaterally, no wheezing, some rales,rhonchi or crepitation. No use of accessory  muscles of respiration.  CARDIOVASCULAR: S1, S2 normal. No murmurs, rubs, or gallops.  ABDOMEN: Soft, nontender, nondistended. Bowel sounds present. No organomegaly or mass.  EXTREMITIES: No pedal edema, cyanosis, or clubbing.  NEUROLOGIC: Cranial nerves II through XII are intact. Muscle strength 5/5 in all extremities. Sensation intact. Gait not checked.  PSYCHIATRIC: The patient is alert and oriented x 3.  SKIN: No obvious rash, lesion, or ulcer.   Physical Exam LABORATORY PANEL:   CBC Recent Labs  Lab 04/27/18 0359  WBC 15.1*  HGB 8.7*  HCT 27.9*  PLT 151   ------------------------------------------------------------------------------------------------------------------  Chemistries  Recent Labs  Lab 04/25/18 0316  04/26/18 0423  04/27/18 0359  NA 138  --   --    < > 140  K 3.7  --   --    < > 3.5  CL 102  --   --    < > 105  CO2 25  --   --    < > 26  GLUCOSE 214*  --   --    < > 238*  BUN 17  --   --    < > 17  CREATININE 1.41*  --   --    < > 1.54*  CALCIUM 8.9  --   --    < > 7.9*  MG  --    < > 2.1  --   --   AST 13*  --   --   --   --   ALT 16  --   --   --   --   [facility]  85  --   --   --   --   BILITOT 0.8  --   --   --   --    < > = values in this interval not displayed.   ------------------------------------------------------------------------------------------------------------------  Cardiac Enzymes Recent Labs  Lab 04/25/18 0805 04/26/18 0423  TROPONINI 0.03* 0.03*   ------------------------------------------------------------------------------------------------------------------  RADIOLOGY:  Dg Chest Left Decubitus  Result Date: 04/26/2018 CLINICAL DATA:  57 year old male. Follow-up pleural effusions. Subsequent encounter. EXAM: CHEST - LEFT DECUBITUS COMPARISON:  04/25/2018 chest x-ray. FINDINGS: Left-side down decubitus view of the chest reveals left pleural fluid layers in a dependent position. IMPRESSION: Left pleural effusion layers on  decubitus view. Electronically Signed   By: Genia Del M.D.   On: 04/26/2018 15:01   Dg Chest Port 1 View  Result Date: 04/27/2018 CLINICAL DATA:  Status post thoracentesis. EXAM: PORTABLE CHEST 1 VIEW COMPARISON:  Ultrasound 04/27/2018.  Chest x-ray 04/26/2018. FINDINGS: Interval left-sided thoracentesis. Decrease in left-sided pleural effusion. Left base atelectasis. No pneumothorax. Stable cardiomegaly. IMPRESSION: 1. Interval left thoracentesis with decrease in left-sided pleural effusion. Left base atelectasis. No pneumothorax. 2.  Stable cardiomegaly. Electronically Signed   By: Marcello Moores  Register   On: 04/27/2018 10:42   US Thoracentesis Asp Pleural Space W/img Guide  Result Date: 04/27/2018 INDICATION: Left pleural effusion EXAM: ULTRASOUND GUIDED LEFT THORACENTESIS MEDICATIONS: None. COMPLICATIONS: None immediate. PROCEDURE: An ultrasound guided thoracentesis was thoroughly discussed with the patient and questions answered. The benefits, risks, alternatives and complications were also discussed. The patient understands and wishes to proceed with the procedure. Written consent was obtained. Ultrasound was performed to localize and mark an adequate pocket of fluid in the left chest. The area was then prepped and draped in the normal sterile fashion. 1% Lidocaine was used for local anesthesia. Under ultrasound guidance a 6 Fr Safe-T-Centesis catheter was introduced. Thoracentesis was performed. The catheter was removed and a dressing applied. FINDINGS: A total of approximately 1200 cc of clear yellow fluid was removed. Samples were sent to the laboratory as requested by the clinical team. IMPRESSION: Successful ultrasound guided left thoracentesis yielding 1200 cc of pleural fluid. Electronically Signed   By: Marybelle Killings M.D.   On: 04/27/2018 10:04    ASSESSMENT AND PLAN:   Active Problems:   Healthcare associated bacterial pneumonia   FSGS (focal segmental glomerulosclerosis)  *Healthcare  acquired pneumonia-sepsis Broad-spectrum antibiotics.  Cultures are sent. Also ordered Legionella by admitting physician. MRSA PCR negative, stop Vanco continue Zosyn. This is patient's fourth admission for similar complaints.  Had multiple work-ups in the past and ID physician had suggested to stop antibiotics. Each time he got significantly better within 1 to 2 days of receiving IV steroids and was discharged with oral tapering steroid.  Once the steroid comes off he started having the symptoms again. I spoke to ID physician and pulmonary physician extensively about the patient.  They suspect that this could be some kind of autoimmune disorder.  And suggested to do work-up for that but not to start IV steroid at this time. Patient also seems to be having some left-sided pleural effusion, pulmonology suggested IR guided thoracentesis. Also consulted rheumatology. Suggested to keep amiodarone toxicity as a differential diagnosis. Stop it for now.  *Diabetes mellitus with hyperglycemia Keep on sliding scale coverage.  *Chronic kidney disease stage III Renal function appears at baseline, continue to monitor.  Avoid nephrotoxic.   Called nephro consult, to help.  *Hypokalemia Hypomagnesemia-replace IV Replace as needed.  *Hypertension She  will continue her home medication but now blood pressure was running low so we will stop the medication and start on IV fluids.    All the records are reviewed and case discussed with Care Management/Social Workerr. Management plans discussed with the patient, family and they are in agreement.  CODE STATUS: Full code  TOTAL TIME TAKING CARE OF THIS PATIENT: 55 minutes.     POSSIBLE D/C IN 1-2 DAYS, DEPENDING ON CLINICAL CONDITION.   Vaughan Basta M.D on 04/27/2018   Between 7am to 6pm - Pager - [phone]  After 6pm go to [email or web address] - password [email or web address] [facility]  Office  [phone]  CC: Primary care  physician; Katheren Shams  Note: This dictation was prepared with Dragon dictation along with smaller phrase technology. Any transcriptional errors that result from this process are unintentional.

## 2018-04-27 NOTE — Progress Notes (Signed)
PULMONARY CONSULT NOTE  Requesting MD/Service: Anselm Jungling Date of initial consultation: 10/28 Reason for consultation: L pleural effusion  PT PROFILE: 57 y.o. male never smoker with history of HIV, CKD, schizophrenia and recurrent illness of fever, cough, dyspnea, left pleural effusion.  A recent CT chest also reveals a very small pericardial effusion.  He is noted to have a very elevated ESR.  He has previously been seen by rheumatology with a rheumatology panel performed which is unrevealing.  HPI:  As above.  I have reviewed his recent admission notes as well as the extensive note by Dr. Delaine Lame and at this point have little further to add to this history.  Past Medical History:  Diagnosis Date  . Anemia   . Anxiety   . Arthritis   . CHF (congestive heart failure) ([place])   . Chronic kidney disease    Renal Insufficiency Syndrome; Glomerulosclerosis 2013  . Complication of anesthesia   . Depressed   . Diabetes mellitus without complication ([place])   . High cholesterol   . HIV (human immunodeficiency virus infection) ([place])   . Hypertension   . 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 Paranoid disorder ([place])   . Schizophrenia, paranoid ([place])   . Sleep apnea     Past Surgical History:  Procedure Laterality Date  . COLONOSCOPY WITH PROPOFOL N/A 09/03/2015   Procedure: COLONOSCOPY WITH PROPOFOL;  Surgeon: Lollie Sails, MD;  Location: [facility] ENDOSCOPY;  Service: Endoscopy;  Laterality: N/A;  . DG TEETH FULL    . ESOPHAGOGASTRODUODENOSCOPY (EGD) WITH PROPOFOL N/A 07/01/2016   Procedure: ESOPHAGOGASTRODUODENOSCOPY (EGD) WITH PROPOFOL;  Surgeon: Lollie Sails, MD;  Location: [facility] ENDOSCOPY;  Service: Endoscopy;  Laterality: N/A;    MEDICATIONS: I have reviewed all medications and confirmed regimen as documented  Social History   Socioeconomic History  . Marital status: Single    Spouse name: Not on file  . Number of children: Not on file  . Years of education: 83  . Highest education  level: GED or equivalent  Occupational History  . Not on file  Social Needs  . Financial resource strain: Not hard at all  . Food insecurity:    Worry: Sometimes true    Inability: Never true  . Transportation needs:    Medical: No    Non-medical: No  Tobacco Use  . Smoking status: Never Smoker  . Smokeless tobacco: Never Used  Substance and Sexual Activity  . Alcohol use: No  . Drug use: No  . Sexual activity: Never  Lifestyle  . Physical activity:    Days per week: 0 days    Minutes per session: 0 min  . Stress: To some extent  Relationships  . Social connections:    Talks on phone: More than three times a week    Gets together: Three times a week    Attends religious service: Never    Active member of club or organization: No    Attends meetings of clubs or organizations: Never    Relationship status: Never married  . Intimate partner violence:    Fear of current or ex partner: Patient refused    Emotionally abused: Patient refused    Physically abused: Patient refused    Forced sexual activity: Patient refused  Other Topics Concern  . Not on file  Social History Narrative  . Not on file    Family History  Problem Relation Age of Onset  . Heart attack Mother   . Diabetes Mellitus II Mother   .  Mental illness Mother   . CAD Mother   . Heart attack Father   . CAD Father   . Hypertension Father     ROS: No fever, myalgias/arthralgias, unexplained weight loss or weight gain No new focal weakness or sensory deficits No otalgia, hearing loss, visual changes, nasal and sinus symptoms, mouth and throat problems No neck pain or adenopathy No abdominal pain, N/V/D, diarrhea, change in bowel pattern No dysuria, change in urinary pattern   Vitals:   04/27/18 0920 04/27/18 0957 04/27/18 1045 04/27/18 1214  BP: 126/67 114/71 (!) 107/41   Pulse:  (!) 106 (!) 103   Resp: 20 18    Temp:   (!) 102.9 F (39.4 C) 98.7 F (37.1 C)  TempSrc:   Oral Oral  SpO2: 95%  94% 94%   Weight:      Height:         EXAM:  Gen: NAD HEENT: NCAT, sclera white Neck: No JVD Lungs: Minimal bibasilar crackles.  No pleural friction rub.  No wheezes or rhonchi Cardiovascular: RRR, no murmurs Abdomen: Soft, nontender, normal BS Ext: without clubbing, cyanosis, edema Neuro: grossly intact Skin: Limited exam, no lesions noted   DATA:   BMP Latest Ref Rng & Units 04/27/2018 04/26/2018 04/25/2018  Glucose 70 - 99 mg/dL 238(H) 246(H) 214(H)  BUN 6 - 20 mg/dL _0 Creatinine 0.61 - 1.24 mg/dL 1.54(H) 1.59(H) 1.41(H)  Sodium 135 - 145 mmol/L 140 137 138  Potassium 3.5 - 5.1 mmol/L 3.5 3.8 3.7  Chloride 98 - 111 mmol/L 105 108 102  CO2 22 - 32 mmol/L _1 Calcium 8.9 - 10.3 mg/dL 7.9(L) 8.0(L) 8.9    CBC Latest Ref Rng & Units 04/27/2018 04/26/2018 04/25/2018  WBC 4.0 - 10.5 K/uL 15.1(H) 14.1(H) 12.6(H)  Hemoglobin 13.0 - 17.0 g/dL 8.7(L) 9.3(L) 11.4(L)  Hematocrit 39.0 - 52.0 % 27.9(L) 29.4(L) 35.4(L)  Platelets 150 - 400 K/uL 151 167 243   Pleural fluid analysis: Borderline exudative chemistries.  Rest of fluid analysis pending  CXR: Post-thoracentesis, there is minimal left basilar atelectasis and probable small residual left pleural effusion  I have personally reviewed all chest radiographs reported above including CXRs and CT chest unless otherwise indicated  IMPRESSION:   Febrile illness with recurrent left pleural effusion.  I concur with Dr Delaine Lame that this does not seem to be recurrent community-acquired pneumonia.  Other possible explanations could include malignancy, connective tissue disease, drug-induced pleuro-pericarditis etc.   PLAN:  Follow-up on rest of pleural fluid analysis, most importantly cytology, flow cytometry Agree with monitoring off of antibacterial antimicrobials. Follow CXR over time to monitor for recurrence of left pleural effusion  If pleural fluid re-accumulates and we remain without an explanation, the  next step in evaluation should probably be VATS pleural biopsy  Merton Border, MD PCCM service Mobile [phone] Pager [phone] 04/27/2018 1:18 PM

## 2018-04-27 NOTE — Procedures (Signed)
Left thoracentesis 1200 cc EBL 0 Comp 0

## 2018-04-27 NOTE — Plan of Care (Signed)
Continues to have fevers.

## 2018-04-27 NOTE — Progress Notes (Signed)
Danny Cook. is a 57 y.o. malewith history of HIV, CHF, chronic kidney disease(FSGN), schizophrenia  is admitted with cough, fever . Pt has had recurrent hospitalization for the same complaint. Was recently in [facility] between 10/14-10/16 and treated as Pneumonia with IV vanco+ cefepime and then sent home on cefdinir eventhough CT chest did not show any pneumonia (left pleural effusion and atelectasis) and N procal) He also received steroids which made him feel much better- He was sent home on 3 days of steroids and since stopping it he started to get fever, cough and chills, No sputum, no hemoptysis, no chest pain,  Has headaches. He currently lives in [facility] care assisted living  Prior to this was hospitalized between 9/21-9/2 6 for dizziness, nausea ,headaches and cough and found to be hypotensive, AKI on CKD and was in ICU and treated with fluids and pressors. 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 As there was no tamponade no pericardiocentesis was done. He also had high ESR and was seen by rheumatologist and work up including ANA, RH factor, ANCA were negative. HE was treated with IV antibiotics for a few days and also was given antibiotics along with steroids  for possible meningitis   for a day  as he had become drowsy in the ICU. All the antibiotics were stopped as there was no evidence clinically of meningitis or encephalitis. CT scan of the head, MRI of the brain and MRA of the brain was negative for acute pathology. Carotid ultrasound negative. EEG negative for seizures. Mental status improved. The blood cultures were also negative. AKI was thought to be due to volume depletion due to JArdiance and lasix which were stopped by his PCP a day prior to admission  Pt was also very anxious from his underlying mental health problem.  After discharge on 9/28 he returned to the ED on 9/30  complaining of feeling anxious and sob. He did not want to live alone as it was making him anxious and so he was sent to an assisted living.    Reviewed rheumatologist and pulmonologist notes Had thoracentesis today\1.2 litre fluid removed Cough less Has chills and fever   Objective:  VITALS:  Patient Vitals for the past 24 hrs:  BP Temp Temp src Pulse Resp SpO2  04/27/18 1626 103/88 98.3 F (36.8 C) Oral (!) 131 - 94 %  04/27/18 1214 - 98.7 F (37.1 C) Oral - - -  04/27/18 1045 (!) 107/41 (!) 102.9 F (39.4 C) Oral (!) 103 - 94 %  04/27/18 0957 114/71 - - (!) 106 18 94 %  04/27/18 0920 126/67 - - - 20 95 %  04/27/18 0754 - - - - - 94 %  04/27/18 0741 132/78 97.6 F (36.4 C) Oral 91 - 94 %  04/27/18 0430 (!) 91/55 (!) 97.3 F (36.3 C) Oral 77 18 95 %  04/26/18 2334 121/70 (!) 103 F (39.4 C) Oral 95 18 -  04/26/18 2041 - 98.6 F (37 C) Oral - - -  04/26/18 1945 - - - - - 93 %  04/26/18 1936 - (!) 97.5 F (36.4 C) Oral - - -  04/26/18 1931 93/72 - - 80 18 98 %  PHYSICAL EXAM:  General: Alert, cooperative, no distress, appears stated age.  Head: Normocephalic, without obvious abnormality, atraumatic. Eyes: Conjunctivae clear, anicteric sclerae. Pupils are equal ENT Nares normal. No drainage  or sinus tenderness. Lips, mucosa, and tongue normal. No Thrush Neck: Supple, symmetrical, no adenopathy, thyroid: non tender no carotid bruit and no JVD. Back: No CVA tenderness. Lungs: b/l air entry, decreased left base Heart: Regular rate and rhythm, no murmur, rub or gallop. Abdomen: Soft, non-tender,not distended. Bowel sounds normal. No masses Extremities: atraumatic, no cyanosis. No edema. No clubbing Skin: No rashes or lesions. Or bruising Lymph: Cervical, supraclavicular normal. Neurologic: Grossly non-focal Pertinent Labs Lab Results CBC    Component Value Date/Time   WBC 15.1 (H) 04/27/2018 0359   RBC 2.96 (L) 04/27/2018 0359   HGB 8.7 (L) 04/27/2018 0359   HGB 9.5  (L) 03/23/2018 2341   HCT 27.9 (L) 04/27/2018 0359   HCT 29.5 (L) 03/23/2018 2341   PLT 151 04/27/2018 0359   PLT 156 03/23/2018 2341   MCV 94.3 04/27/2018 0359   MCV 95 03/23/2018 2341   MCV 93 10/23/2014 1719   MCH 29.4 04/27/2018 0359   MCHC 31.2 04/27/2018 0359   RDW 15.6 (H) 04/27/2018 0359   RDW 14.9 03/23/2018 2341   RDW 13.0 10/23/2014 1719   LYMPHSABS 1.1 04/25/2018 0316   LYMPHSABS 2.9 03/23/2018 2341   LYMPHSABS 2.3 12/27/2012 0550   MONOABS 1.1 (H) 04/25/2018 0316   MONOABS 0.7 12/27/2012 0550   EOSABS 0.0 04/25/2018 0316   EOSABS 0.1 03/23/2018 2341   EOSABS 0.3 12/27/2012 0550   BASOSABS 0.0 04/25/2018 0316   BASOSABS 0.0 03/23/2018 2341   BASOSABS 0.1 12/27/2012 0550    CMP Latest Ref Rng & Units 04/27/2018 04/26/2018 04/25/2018  Glucose 70 - 99 mg/dL 238(H) 246(H) 214(H)  BUN 6 - 20 mg/dL 17 16 17   Creatinine 0.61 - 1.24 mg/dL 1.54(H) 1.59(H) 1.41(H)  Sodium 135 - 145 mmol/L 140 137 138  Potassium 3.5 - 5.1 mmol/L 3.5 3.8 3.7  Chloride 98 - 111 mmol/L 105 108 102  CO2 22 - 32 mmol/L 26 22 25   Calcium 8.9 - 10.3 mg/dL 7.9(L) 8.0(L) 8.9  Total Protein 6.5 - 8.1 g/dL - - 7.1  Total Bilirubin 0.3 - 1.2 mg/dL - - 0.8  Alkaline Phos 38 - 126 U/L - - 85  AST 15 - 41 U/L - - 13(L)  ALT 0 - 44 U/L - - 16      Microbiology: Recent Results (from the past 240 hour(s))  Culture, blood (routine x 2)     Status: None (Preliminary result)   Collection Time: 04/24/18  7:06 PM  Result Value Ref Range Status   Specimen Description BLOOD LEFT HAND  Final   Special Requests   Final    BOTTLES DRAWN AEROBIC AND ANAEROBIC Blood Culture results may not be optimal due to an excessive volume of blood received in culture bottles   Culture   Final    NO GROWTH 3 DAYS Performed at [facility], [address] [place], [place] [address]    Report Status PENDING  Incomplete  Culture, blood (routine x 2)     Status: None (Preliminary result)   Collection Time:  04/24/18  7:40 PM  Result Value Ref Range Status   Specimen Description BLOOD LEFT ANTECUBITAL  Final   Special Requests   Final    BOTTLES DRAWN AEROBIC AND ANAEROBIC Blood Culture results may not be optimal due to an excessive volume of blood received in culture bottles   Culture   Final    NO GROWTH 3 DAYS Performed at [facility], [address] [place],  [place] [address]    Report Status PENDING  Incomplete  Blood Culture (routine x 2)     Status: None (Preliminary result)   Collection Time: 04/25/18  3:20 AM  Result Value Ref Range Status   Specimen Description BLOOD RIGHT ANTECUBITAL  Final   Special Requests   Final    BOTTLES DRAWN AEROBIC AND ANAEROBIC Blood Culture results may not be optimal due to an excessive volume of blood received in culture bottles   Culture   Final    NO GROWTH 2 DAYS Performed at [facility], [address], [place], [place] [address]    Report Status PENDING  Incomplete  Blood Culture (routine x 2)     Status: None (Preliminary result)   Collection Time: 04/25/18  3:54 AM  Result Value Ref Range Status   Specimen Description BLOOD BLOOD LEFT HAND  Final   Special Requests   Final    BOTTLES DRAWN AEROBIC AND ANAEROBIC Blood Culture results may not be optimal due to an excessive volume of blood received in culture bottles   Culture   Final    NO GROWTH 2 DAYS Performed at [facility], [address] [place], [place] [address]    Report Status PENDING  Incomplete  Urine culture     Status: None   Collection Time: 04/25/18  6:57 AM  Result Value Ref Range Status   Specimen Description   Final    URINE, RANDOM Performed at [facility], [address], [place], [place] [address]    Special Requests   Final    NONE Performed at [facility], [address] [place], [place] [address]    Culture   Final    NO GROWTH Performed at [facility], [place] [address] [place], [place]  [address]    Report Status 04/26/2018 FINAL  Final  MRSA PCR Screening     Status: None   Collection Time: 04/26/18  1:08 AM  Result Value Ref Range Status   MRSA by PCR NEGATIVE NEGATIVE Final    Comment:        The GeneXpert MRSA Assay (FDA approved for NASAL specimens only), is one component of a comprehensive MRSA colonization surveillance program. It is not intended to diagnose MRSA infection nor to guide or monitor treatment for MRSA infections. Performed at [facility], [address] [place], [place] [address]   Body fluid culture     Status: None (Preliminary result)   Collection Time: 04/27/18  9:50 AM  Result Value Ref Range Status   Specimen Description   Final    PLEURAL Performed at [facility], [address] [place], [place] [address]    Special Requests   Final    NONE Performed at [facility], [address] [place], [place] [address]    Gram Stain   Final    RARE WBC PRESENT, PREDOMINANTLY PMN NO ORGANISMS SEEN Performed at [facility], [place] [address] [place], [place] [address]    Culture PENDING  Incomplete   Report Status PENDING  Incomplete   IMAGING RESULTS: ? I  Impression/Recommendation  57 y.o. malewith history of HIV, CHF, chronic kidney disease(FSGN), schizophrenia  is admitted with cough, fever . Pt has had recurrent hospitalization for the same complaint.  ?Recurrent visits to ED and hospitalization with constellation of symptoms like cough, Fever SOB, headache, anxiety Not clear what the underlying pathology is? He has following positive  findings Anemia since Sept 2019 High ESR > 100 Pericardial effusion  pleural effusion Fever Serum protein electrophoresis shows low M spike 0.4 Neg ANA, ANCA, normal ferritin,  ? autoimmuneVS malignancy VS infection VS drug induced ?Sarcoid ?TRAPS ? 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   Afib on amiodarone-   As per uptodate "A nonproductive cough and dyspnea are present in 50 of 75 percent of affected individuals at presentation. Pleuritic pain, weight loss, fever (33 to 50 percent of cases), and malaise can also occur".  But it looks like amiodarone was started only in Sept when he already had these symptoms.  it may still be worth stopping it and observing? To check with cardiology to see whether amiodarone can bestopped _ HIV- well controlled on Triumeq- VL < 20 and cd4 754 __________________________________________________ Discussed with patient and care team

## 2018-04-27 NOTE — Consult Note (Signed)
Reason for Consult: Elevated sedimentation rate  Referring Physician: Hospitalist  [name].   HPI: Complicated story.  57 year old white male.  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  Went into atrial fibrillation.  Had echocardiogram showing pericardial effusion.  Acute renal failure.  Was placed on amiodarone.  Had antibiotics.  Transient steroids with improvement. His discharge.  Return with shortness of breath infiltrate thought to be pneumonia.  Was treated with antibiotic and steroid with improvement.  Became short winded on discharge to home.  Readmitted with large pleural effusion.  No definite infiltrate.  Positive fevers.  White count elevated. Has had worsening anemia now with hemoglobin down to 8.7.  No thrombocytosis.  Creatinine 1.5.  Serum protein like pheresis shows low M spike at 0.4.  Total protein is 6.8.  Sed rate 127 Sedimentation rate 127 Negative ANA, ACE, ANCA, rheumatoid factor Today had thoracentesis.  Still febrile.  Tachycardic and short winded today Recent ferritin normal Does have headaches.  History of migraines.  Headache is worse when he is coughing.  Usually posterior.  Has not had recent jaw pain though one ER evaluation present.  He does not have pain in his jaw when he chews.  He has not been swelling his legs.  His joints have not been swelling.  There is been no photosensitive rash.  No rash with procedures such as IV.  Chest CT showed mediastinal adenopathy thought to be reactive but no interstitial lung disease positive proteinuria at 100  PMH: HIV prediabetes.  Reflux.  Schizophrenia.  SURGICAL HISTORY: No joint surgery  Family History: No family history of connective tissue disease  Social History: Previously worked in [employer].  No significant cigarettes or alcohol  Allergies:  Allergies  Allergen Reactions  .  Trazodone And Nefazodone   . Viread [Tenofovir Disoproxil]     Damages kidneys  . Etodolac Rash    Medications:  Scheduled: . abacavir-dolutegravir-lamiVUDine  1 tablet Oral Daily  . amiodarone  100 mg Oral Daily  . aspirin EC  325 mg Oral Daily  . budesonide (PULMICORT) nebulizer solution  1 mg Nebulization BID  . divalproex  500 mg Oral BID  . enoxaparin (LOVENOX) injection  40 mg Subcutaneous Q24H  . feeding supplement (NEPRO CARB STEADY)  237 mL Oral BID BM  . ibuprofen  200 mg Oral Once  . iloperidone  12 mg Oral BID  . insulin aspart  0-15 Units Subcutaneous TID WC  . insulin aspart  0-5 Units Subcutaneous QHS  . mirtazapine  30 mg Oral QHS  . pantoprazole  40 mg Oral BID  . rosuvastatin  40 mg Oral Daily  . sucralfate  1 g Oral BID  . venlafaxine XR  75 mg Oral Q breakfast  . cyanocobalamin  1,000 mcg Oral Daily        ROS: As above.  Weight down 14 pounds.  No abdominal pain.  No bloody diarrhea.  No joint pain.  No leg swelling   PHYSICAL EXAM: Blood pressure (!) 107/41, pulse (!) 103, temperature 98.7 F (37.1 C), temperature source Oral, resp. rate 18, height 5' 7"  (1.702 m), weight 93 kg, SpO2 94 %. Ill-appearing male.  Seen in bed.  Mildly pale sclera.  Heart rate at 100.  Febrile.  Skin without rash.  Lymph negative without significant cervical adenopathy.  Chest with distant lung sounds but no  wheezing or rales.  Systolic murmur.  Nontender abdomen.  No significant lower extremity edema Musculoskeletal: No synovitis.  Neck supple.  Shoulders well.  Hands and knees without synovitis neurologic: Oriented.  Moves all extremities well  Assessment: Recent onset of illness with manifestations of fever, anemia, serositis (pericardial and pleural) with and without pulmonary infiltrate, proteinuria, elevated sed rate.   -Seronegative for ANCA vasculitis and for lupus.  -Would be atypical for Adult Still's disease as he has normal ferritin and no rash.   -Question  hypersensitivity however currently has no pulmonary infiltrate.  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  -Question amiodarone toxicity.   - He does have headaches but the breadth of this illness would be atypical for giant cell arteritis.He  currently has no jaw pain   Proteinuria/renal insufficiency  ?all d/t FSG versus a second etiology  HIV,quiet  Diabetes  Atrial fibrillation, coronary disease  Recommendations:  Urine protein to creatinine ratio Immunoelectrophoresis Consider hematology opinion given his monoclonal gammopathy Consider trial off amiodarone If above and thoracentesis are nondiagnostic, may need consideration of renal biopsy or bone marrow biopsy to rule out amyloid   Emmaline Kluver 04/27/2018, 12:30 PM

## 2018-04-28 ENCOUNTER — Inpatient Hospital Stay: Payer: Medicare Other

## 2018-04-28 LAB — BASIC METABOLIC PANEL
Anion gap: 9 (ref 5–15)
BUN: 15 mg/dL (ref 6–20)
CHLORIDE: 105 mmol/L (ref 98–111)
CO2: 27 mmol/L (ref 22–32)
CREATININE: 1.52 mg/dL — AB (ref 0.61–1.24)
Calcium: 8.3 mg/dL — ABNORMAL LOW (ref 8.9–10.3)
GFR calc Af Amer: 56 mL/min — ABNORMAL LOW (ref >60)
GFR calc non Af Amer: 48 mL/min — ABNORMAL LOW (ref >60)
Glucose, Bld: 117 mg/dL — ABNORMAL HIGH (ref 70–99)
POTASSIUM: 3.6 mmol/L (ref 3.5–5.1)
Sodium: 141 mmol/L (ref 135–145)

## 2018-04-28 LAB — CBC
HCT: 27.2 % — ABNORMAL LOW (ref 39.0–52.0)
HEMOGLOBIN: 8.5 g/dL — AB (ref 13.0–17.0)
MCH: 29.2 pg (ref 26.0–34.0)
MCHC: 31.3 g/dL (ref 30.0–36.0)
MCV: 93.5 fL (ref 80.0–100.0)
Platelets: 168 10*3/uL (ref 150–400)
RBC: 2.91 MIL/uL — AB (ref 4.22–5.81)
RDW: 15.7 % — ABNORMAL HIGH (ref 11.5–15.5)
WBC: 9.5 10*3/uL (ref 4.0–10.5)
nRBC: 0 % (ref 0.0–0.2)

## 2018-04-28 LAB — GLUCOSE, PLEURAL OR PERITONEAL FLUID: GLUCOSE FL: 198 mg/dL

## 2018-04-28 LAB — GLUCOSE, CAPILLARY
GLUCOSE-CAPILLARY: 107 mg/dL — AB (ref 70–99)
GLUCOSE-CAPILLARY: 182 mg/dL — AB (ref 70–99)
Glucose-Capillary: 124 mg/dL — ABNORMAL HIGH (ref 70–99)
Glucose-Capillary: 174 mg/dL — ABNORMAL HIGH (ref 70–99)

## 2018-04-28 LAB — INFLUENZA PANEL BY PCR (TYPE A & B)
INFLBPCR: NEGATIVE
Influenza A By PCR: NEGATIVE

## 2018-04-28 LAB — SEDIMENTATION RATE: Sed Rate: 105 mm/hr — ABNORMAL HIGH (ref 0–20)

## 2018-04-28 LAB — ANGIOTENSIN CONVERTING ENZYME: Angiotensin-Converting Enzyme: <15 U/L (ref 14–82)

## 2018-04-28 LAB — IMMUNOFIXATION, URINE

## 2018-04-28 MED ORDER — DOLUTEGRAVIR SODIUM 50 MG PO TABS: 50.0000 mg | ORAL_TABLET | Freq: Every day | ORAL | Status: DC

## 2018-04-28 MED ORDER — ABACAVIR-DOLUTEGRAVIR-LAMIVUD 600-50-300 MG PO TABS
1.0000 | ORAL_TABLET | Freq: Every day | ORAL | Status: DC
  Administered 2018-04-29 – 2018-04-30 (×2): 1 via ORAL
  Filled 2018-04-28 (×2): qty 1

## 2018-04-28 MED ORDER — LAMIVUDINE 150 MG PO TABS: 300.0000 mg | ORAL_TABLET | Freq: Every day | ORAL | Status: DC

## 2018-04-28 MED ORDER — METOPROLOL TARTRATE 25 MG PO TABS
25.0000 mg | ORAL_TABLET | Freq: Two times a day (BID) | ORAL | Status: DC
  Administered 2018-04-28 – 2018-05-02 (×8): 25 mg via ORAL
  Filled 2018-04-28 (×11): qty 1

## 2018-04-28 NOTE — Progress Notes (Signed)
[facility] [place] NAME: Danny Cook    MR#:  [id number]  DATE OF BIRTH:  [birth year]  SUBJECTIVE:   Patient still having fever spikes of 103 yesterday.  Has been admitted multiple times for fever/altered mental status and cough and suspected to have pneumonia and has responded to steroids and IV antibiotics.  He says he feels better and wants to go home.  REVIEW OF SYSTEMS:    Review of Systems  Constitutional: Positive for fever. Negative for chills.  HENT: Negative for congestion and tinnitus.   Eyes: Negative for blurred vision and double vision.  Respiratory: Negative for cough, shortness of breath and wheezing.   Cardiovascular: Negative for chest pain, orthopnea and PND.  Gastrointestinal: Negative for abdominal pain, diarrhea, nausea and vomiting.  Genitourinary: Negative for dysuria and hematuria.  Neurological: Negative for dizziness, sensory change and focal weakness.  All other systems reviewed and are negative.   Nutrition: Heart healthy/Carb modified Tolerating Diet: yes Tolerating PT: Ambulatory  DRUG ALLERGIES:   Allergies  Allergen Reactions  . Trazodone And Nefazodone   . Viread [Tenofovir Disoproxil]     Damages kidneys  . Etodolac Rash    VITALS:  Blood pressure 117/65, pulse 88, temperature 98.2 F (36.8 C), temperature source Oral, resp. rate 18, height 5\' 7"  (1.702 m), weight 93 kg, SpO2 94 %.  PHYSICAL EXAMINATION:   Physical Exam  GENERAL:  57 y.o.-year-old obese patient sitting up in bed in no acute distress.  EYES: Pupils equal, round, reactive to light and accommodation. No scleral icterus. Extraocular muscles intact.  HEENT: Head atraumatic, normocephalic. Oropharynx and nasopharynx clear.  NECK:  Supple, no jugular venous distention. No thyroid enlargement, no tenderness.  LUNGS: Normal breath sounds bilaterally, no wheezing, rales, rhonchi. No use of accessory muscles of respiration.    CARDIOVASCULAR: S1, S2 normal. No murmurs, rubs, or gallops.  ABDOMEN: Soft, nontender, nondistended. Bowel sounds present. No organomegaly or mass.  EXTREMITIES: No cyanosis, clubbing or edema b/l.    NEUROLOGIC: Cranial nerves II through XII are intact. No focal Motor or sensory deficits b/l. Globally weak.  PSYCHIATRIC: The patient is alert and oriented x 3.  SKIN: No obvious rash, lesion, or ulcer.    LABORATORY PANEL:   CBC Recent Labs  Lab 04/28/18 0634  WBC 9.5  HGB 8.5*  HCT 27.2*  PLT 168   ------------------------------------------------------------------------------------------------------------------  Chemistries  Recent Labs  Lab 04/25/18 0316  04/26/18 0423  04/28/18 0634  NA 138  --   --    < > 141  K 3.7  --   --    < > 3.6  CL 102  --   --    < > 105  CO2 25  --   --    < > 27  GLUCOSE 214*  --   --    < > 117*  BUN 17  --   --    < > 15  CREATININE 1.41*  --   --    < > 1.52*  CALCIUM 8.9  --   --    < > 8.3*  MG  --    < > 2.1  --   --   AST 13*  --   --   --   --   ALT 16  --   --   --   --   ALKPHOS 85  --   --   --   --  BILITOT 0.8  --   --   --   --    < > = values in this interval not displayed.   ------------------------------------------------------------------------------------------------------------------  Cardiac Enzymes Recent Labs  Lab 04/26/18 0423  TROPONINI 0.03*   ------------------------------------------------------------------------------------------------------------------  RADIOLOGY:  Dg Chest Port 1 View  Result Date: 04/27/2018 CLINICAL DATA:  Status post thoracentesis. EXAM: PORTABLE CHEST 1 VIEW COMPARISON:  Ultrasound 04/27/2018.  Chest x-ray 04/26/2018. FINDINGS: Interval left-sided thoracentesis. Decrease in left-sided pleural effusion. Left base atelectasis. No pneumothorax. Stable cardiomegaly. IMPRESSION: 1. Interval left thoracentesis with decrease in left-sided pleural effusion. Left base atelectasis. No  pneumothorax. 2.  Stable cardiomegaly. Electronically Signed   By: Marcello Moores  Register   On: 04/27/2018 10:42   US Thoracentesis Asp Pleural Space W/img Guide  Result Date: 04/27/2018 INDICATION: Left pleural effusion EXAM: ULTRASOUND GUIDED LEFT THORACENTESIS MEDICATIONS: None. COMPLICATIONS: None immediate. PROCEDURE: An ultrasound guided thoracentesis was thoroughly discussed with the patient and questions answered. The benefits, risks, alternatives and complications were also discussed. The patient understands and wishes to proceed with the procedure. Written consent was obtained. Ultrasound was performed to localize and mark an adequate pocket of fluid in the left chest. The area was then prepped and draped in the normal sterile fashion. 1% Lidocaine was used for local anesthesia. Under ultrasound guidance a 6 Fr Safe-T-Centesis catheter was introduced. Thoracentesis was performed. The catheter was removed and a dressing applied. FINDINGS: A total of approximately 1200 cc of clear yellow fluid was removed. Samples were sent to the laboratory as requested by the clinical team. IMPRESSION: Successful ultrasound guided left thoracentesis yielding 1200 cc of pleural fluid. Electronically Signed   By: Marybelle Killings M.D.   On: 04/27/2018 10:04     ASSESSMENT AND PLAN:   57 year old male with past medical history of schizophrenia, HIV, obstructive sleep apnea, diabetes, hypertension, CKD stage IV, chronic anemia, history of Kaposi's sarcoma who presents to the hospital due to suspected sepsis.  1.  Sepsis-this was the working diagnosis on admission given patient's fever, tachycardia, elevated white cell count and suspected to have pneumonia.  Patient has been hospitalized multiple times for similar reasons and improved with IV steroids and IV antibiotics. - Seen by infectious disease and they do not think that the patient has underlying sepsis or pneumonia but likely some kind of an autoimmune process which  is steroid responsive.  Sepsis has now been ruled out.  Patient's cultures remain negative and patient has been tapered off antibiotics.  2.  Pneumonia-this was suspected on admission and patient has had repeated admissions for similar complaints and similar reasons and has responded well to antibiotics. -Pulmonary was consulted and I do not think that the patient has underlying pneumonia.  Patient was noted to have a left-sided pleural effusion and is status post thoracentesis but the pleural fluid analysis does not seem infectious in nature. - Pulmonary is possibly considering primary tuberculosis- they will order a QuantiFERON and further analysis on the pleural fluid.  Hold off on antibiotics for now.  3.  Steroid responsive systemic illness- etiology unclear presently. - Seen by pulmonary, rheumatology and appreciate input.  Patient's case was discussed with case conference today with all specialists.  Rheumatology is possibly considering amyloidosis as patient had a mild monoclonal gammopathy with some mild anemia.  I will get hematology oncology consult and they have been notified of the consult. - Continue further care as per pulmonary, rheumatology, await heme-onc input.  4.  Diabetes type 2 without complication-continue  sliding scale insulin, follow blood sugars.  5.  CKD stage IV-patient's creatinine is at baseline.  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 - Follow BUN and creatinine urine output.  Renal dose meds, avoid nephrotoxins.  6.  History of migraines-continue Fioricet.  7.  Anxiety/depression -continue Klonopin, Effexor.  8.  History of bipolar disorder-continue Depakote.  9. GERD - cont. Protonix.    10. Hx of A. Fib -rate controlled, patient was on amiodarone but it was discontinued given possible underlying autoimmune process and leading to pleural effusion. -Continue metoprolol, appreciate cardiology input. Cont.  ASA    All the records are reviewed and case discussed with Care Management/Social Worker. Management plans discussed with the patient, family and they are in agreement.  CODE STATUS: Full code  DVT Prophylaxis: Lovenox  TOTAL TIME TAKING CARE OF THIS PATIENT: 45 minutes.   POSSIBLE D/C IN 2-3 DAYS, DEPENDING ON CLINICAL CONDITION.   Henreitta Leber M.D on 04/28/2018 at 3:21 PM  Between 7am to 6pm - Pager - [phone]  After 6pm go to [email or web address] - [facility]  Office  [phone]  CC: Primary care physician; Katheren Shams

## 2018-04-28 NOTE — Progress Notes (Signed)
PULMONARY CONSULT NOTE  Requesting MD/Service: Anselm Jungling Date of initial consultation: 10/28 Reason for consultation: L pleural effusion  PT PROFILE: 57 y.o. male never smoker with history of HIV, CKD, schizophrenia and recurrent illness of fever, cough, dyspnea, left pleural effusion.  A recent CT chest also reveals a very small pericardial effusion.  He is noted to have a very elevated ESR.  He has previously been seen by rheumatology with a rheumatology panel performed which is unrevealing.  SUBJ:  Febrile to 100.3 last night.  Presently afebrile.  No new complaints.  Denies dyspnea and pleuritic chest pain.  Comfortable on RA  Vitals:   04/28/18 0250 04/28/18 0746 04/28/18 0832 04/28/18 1208  BP: 111/83  117/65   Pulse: (!) 102  88   Resp: (!) 24  18   Temp: 98.6 F (37 C)  99.2 F (37.3 C) 98.2 F (36.8 C)  TempSrc: Oral  Oral Oral  SpO2: 97% 90% 94%   Weight:      Height:      RA  EXAM:  Gen: NAD HEENT: NCAT, sclera white Neck: No JVD Lungs: Bibasilar crackles.  No pleural friction rub.  No wheezes or rhonchi Cardiovascular: RRR, no murmurs, no rub Abdomen: Soft, nontender, normal BS Ext: No edema Neuro: grossly intact Skin: Limited exam, no lesions noted   DATA:   BMP Latest Ref Rng & Units 04/28/2018 04/27/2018 04/26/2018  Glucose 70 - 99 mg/dL 117(H) 238(H) 246(H)  BUN 6 - 20 mg/dL _0 Creatinine 0.61 - 1.24 mg/dL 1.52(H) 1.54(H) 1.59(H)  Sodium 135 - 145 mmol/L 141 140 137  Potassium 3.5 - 5.1 mmol/L 3.6 3.5 3.8  Chloride 98 - 111 mmol/L 105 105 108  CO2 22 - 32 mmol/L _1 Calcium 8.9 - 10.3 mg/dL 8.3(L) 7.9(L) 8.0(L)    CBC Latest Ref Rng & Units 04/28/2018 04/27/2018 04/26/2018  WBC 4.0 - 10.5 K/uL 9.5 15.1(H) 14.1(H)  Hemoglobin 13.0 - 17.0 g/dL 8.5(L) 8.7(L) 9.3(L)  Hematocrit 39.0 - 52.0 % 27.2(L) 27.9(L) 29.4(L)  Platelets 150 - 400 K/uL 168 151 167   Pleural fluid analysis: Borderline exudative chemistries.  WBC 229, 66%  neutrophils, 11% lymphs.   Cytology pending, flow cytometry pending.  CXR: No new film  I have personally reviewed all chest radiographs reported above including CXRs and CT chest unless otherwise indicated  IMPRESSION:   HIV positive, well treated with undetectable viral load and most recent CD4 count >1000 History of schizophrenia, controlled Cyclical fever pattern Small-moderate left pleural effusion with borderline chemistries  DISCUSSION: He has been discussed in detail at case conference today and I have spoken individually with Dr. Verdell Carmine, Dr. Jefm Bryant and Dr. Delaine Lame.  From my pulmonologist's point of view, I would consider primary tuberculosis infection (which can cause fevers and pleural effusion).  Notably, a QuantiFERON gold performed last hospitalization was "indeterminate".  However, the pleural fluid characteristics would not be classic for tuberculous pleuritis.  Also, would consider the possibility of venous thromboembolism which could explain FUO and pleural effusion (if he has had a pulmonary embolism).  However, fevers to this extent are uncommon with VTE.  Nonetheless, since we are struggling with a diagnosis, I will order investigations to pursue these potential diagnoses.  PLAN:  QuantiFERON gold reordered I have asked that adenosine deaminase be added to the pleural fluid evaluation I have ordered venous ultrasound to rule out DVT Repeat 2 view chest x-ray in a.m. 10/31 If pleural fluid re-accumulates, we should  consider requesting VATS pleural biopsy  Merton Border, MD PCCM service Mobile [phone] Pager [phone] 04/28/2018 3:03 PM

## 2018-04-28 NOTE — Plan of Care (Signed)
  Problem: Education: Goal: Knowledge of General Education information will improve Description Including pain rating scale, medication(s)/side effects and non-pharmacologic comfort measures Outcome: Progressing   Problem: Clinical Measurements: Goal: Ability to maintain clinical measurements within normal limits will improve Outcome: Progressing   Problem: Activity: Goal: Risk for activity intolerance will decrease Outcome: Progressing   

## 2018-04-28 NOTE — Consult Note (Signed)
Cardiology Consultation:   Patient ID: Danny Cook. MRN: [id number]; DOB: Jul 29, 1960  Admit date: 04/25/2018 Date of Consult: 04/28/2018  Primary Care Provider: Katheren Shams Primary Cardiologist: No primary care provider on file. Dr. Ubaldo Glassing Primary Electrophysiologist:  None     Patient Profile:   Danny Cook. is a 57 y.o. male with a hx of HIV disease, diabetes, FSG induced chronic kidney disease, history of paroxysmal atrial fibrillation.  History of Present Illness:   Danny Cook  is a 57 y.o. male with a hx of HIV disease, diabetes, FSG induced chronic kidney disease, history of paroxysmal atrial fibrillation currently treated with amiodarone, history of recurrent pleural effusions with history of pericardial effusion without tamponade physiology who is being seen today for the evaluation of shortness of breath with possible amiodarone etiology at the request of Dr. Verdell Carmine.  He also was treated for schizophrenia.  He was recently admitted with shortness of breath and hypoxia with fever of unknown cause with treatment for community-acquired pneumonia of the left lung although there was no obvious pneumonia noted on chest x-ray.  He is now readmitted with increasing shortness of breath.  He had a moderate-sized left pleural effusion which was treated with thoracentesis removing 1200 cc of clear effusion.  Feels a lot better since this was done.  He is able to lay flat in bed without shortness of breath.  EKG reveals sinus rhythm.  Past Medical History:  Diagnosis Date  . Anemia   . Anxiety   . Arthritis   . CHF (congestive heart failure) ([place])   . Chronic kidney disease    Renal Insufficiency Syndrome; Glomerulosclerosis 2013  . Complication of anesthesia   . Depressed   . Diabetes mellitus without complication ([place])   . High cholesterol   . HIV (human immunodeficiency virus infection) ([place])   . Hypertension   . 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  Paranoid disorder ([place])   . Schizophrenia, paranoid ([place])   . Sleep apnea     Past Surgical History:  Procedure Laterality Date  . COLONOSCOPY WITH PROPOFOL N/A 09/03/2015   Procedure: COLONOSCOPY WITH PROPOFOL;  Surgeon: Lollie Sails, MD;  Location: [facility] ENDOSCOPY;  Service: Endoscopy;  Laterality: N/A;  . DG TEETH FULL    . ESOPHAGOGASTRODUODENOSCOPY (EGD) WITH PROPOFOL N/A 07/01/2016   Procedure: ESOPHAGOGASTRODUODENOSCOPY (EGD) WITH PROPOFOL;  Surgeon: Lollie Sails, MD;  Location: [facility] ENDOSCOPY;  Service: Endoscopy;  Laterality: N/A;     Home Medications:  Prior to Admission medications   Medication Sig Start Date End Date Taking? Authorizing Provider  Abacavir-Dolutegravir-Lamivud (TRIUMEQ) 600-50-300 MG TABS Take 1 tablet by mouth daily.   Yes [provider]  albuterol (PROVENTIL HFA;VENTOLIN HFA) 108 (90 Base) MCG/ACT inhaler Inhale 2 puffs into the lungs every 6 (six) hours as needed for wheezing or shortness of breath. 04/14/18  Yes Wieting, Richard, MD  amiodarone (PACERONE) 100 MG tablet Take 1 tablet (100 mg total) by mouth daily. 04/14/18  Yes Loletha Grayer, MD  aspirin EC 325 MG tablet Take 325 mg by mouth daily.   Yes [provider]  benzonatate (TESSALON PERLES) 100 MG capsule Take 1 capsule (100 mg total) by mouth every 6 (six) hours as needed for cough. 04/24/18 04/24/19 Yes McShane, Gerda Diss, MD  butalbital-acetaminophen-caffeine (FIORICET, ESGIC) [phone] MG tablet Take 1-2 tablets by mouth every 6 (six) hours as needed. Patient taking differently: Take 1-2 tablets by mouth every 6 (six) hours as needed for migraine.  01/31/18 01/31/19 Yes Earleen Newport, MD  chlorpheniramine-HYDROcodone (TUSSIONEX) 10-8 MG/5ML SUER Take 5 mLs by mouth every 12 (twelve) hours as needed for cough. 04/14/18  Yes Wieting, Richard, MD  clonazePAM (KLONOPIN) 1 MG tablet Take 1 mg by mouth 2 (two) times daily. Take 1 mg in the morning, 0.5 mg mid-day and 1 mg in  the evening.   Yes [provider]  cyanocobalamin 1000 MCG tablet Take 1,000 mcg by mouth daily.   Yes [provider]  diphenhydrAMINE (BENADRYL) 50 MG capsule Take 1 capsule (50 mg total) by mouth at bedtime as needed for sleep. 11/09/17  Yes McNew, Tyson Babinski, MD  divalproex (DEPAKOTE ER) 250 MG 24 hr tablet Take 500 mg by mouth 2 (two) times daily.    Yes [provider]  doxycycline (VIBRAMYCIN) 100 MG capsule Take 1 capsule (100 mg total) by mouth 2 (two) times daily. 04/24/18  Yes Schuyler Amor, MD  enalapril (VASOTEC) 2.5 MG tablet Take 1 tablet (2.5 mg total) by mouth daily. 03/28/18  Yes Wieting, Richard, MD  FANAPT 12 MG TABS Take 12 mg by mouth 2 (two) times daily.  11/14/16  Yes [provider]  fluticasone (FLOVENT HFA) 220 MCG/ACT inhaler Inhale 2 puffs into the lungs 2 (two) times daily. Rinse out mouth afterwards 04/14/18  Yes Wieting, Richard, MD  Insulin Degludec (TRESIBA FLEXTOUCH) 200 UNIT/ML SOPN Inject into the skin daily.   Yes [provider]  liraglutide (VICTOZA) 18 MG/3ML SOPN Inject 1.8 mg into the skin See admin instructions. Daily if blood sugar is greater than 85; patient uses tresiba if blood sugar is less than 85   Yes [provider]  metFORMIN (GLUCOPHAGE) 1000 MG tablet Take 1,000 mg by mouth 2 (two) times daily with a meal.   Yes [provider]  mirtazapine (REMERON) 30 MG tablet Take 30 mg by mouth at bedtime. 04/21/18  Yes [provider]  nitroGLYCERIN (NITROSTAT) 0.4 MG SL tablet Place 0.4 mg under the tongue every 5 (five) minutes as needed for chest pain.    Yes [provider]  Nutritional Supplements (FEEDING SUPPLEMENT, NEPRO CARB STEADY,) LIQD Take 237 mLs by mouth 2 (two) times daily between meals. 03/27/18  Yes Wieting, Richard, MD  omeprazole (PRILOSEC) 40 MG capsule Take 40 mg by mouth 2 (two) times daily.   Yes [provider]  promethazine (PHENERGAN) 6.25  MG/5ML syrup Take 6.25 mg by mouth every 6 (six) hours as needed for nausea or vomiting.   Yes [provider]  rosuvastatin (CRESTOR) 20 MG tablet Take 40 mg by mouth daily.    Yes [provider]  sucralfate (CARAFATE) 1 g tablet Take 1 g by mouth 2 (two) times daily. 04/21/18  Yes [provider]  triamcinolone cream (KENALOG) 0.1 % Apply 1 application topically 2 (two) times daily as needed. Patient taking differently: Apply 1 application topically 2 (two) times daily as needed (for rash).  11/16/16  Yes Little, Traci M, PA-C  venlafaxine XR (EFFEXOR-XR) 75 MG 24 hr capsule Take 1 capsule (75 mg total) by mouth daily with breakfast. 11/09/17  Yes McNew, Tyson Babinski, MD  predniSONE (DELTASONE) 10 MG tablet 4 tabs po daily for 3 dyays Patient not taking: Reported on 04/25/2018 04/14/18   Loletha Grayer, MD    Inpatient Medications: Scheduled Meds: . abacavir-dolutegravir-lamiVUDine  1 tablet Oral Daily  . aspirin EC  325 mg Oral Daily  . budesonide (PULMICORT) nebulizer solution  1 mg  Nebulization BID  . divalproex  500 mg Oral BID  . enoxaparin (LOVENOX) injection  40 mg Subcutaneous Q24H  . feeding supplement (NEPRO CARB STEADY)  237 mL Oral BID BM  . ibuprofen  200 mg Oral Once  . iloperidone  12 mg Oral BID  . insulin aspart  0-15 Units Subcutaneous TID WC  . insulin aspart  0-5 Units Subcutaneous QHS  . insulin aspart  4 Units Subcutaneous TID WC  . losartan  25 mg Oral Daily  . metoprolol tartrate  25 mg Oral BID  . mirtazapine  30 mg Oral QHS  . pantoprazole  40 mg Oral BID  . rosuvastatin  40 mg Oral Daily  . sucralfate  1 g Oral BID  . venlafaxine XR  75 mg Oral Q breakfast  . cyanocobalamin  1,000 mcg Oral Daily   Continuous Infusions:  PRN Meds: acetaminophen, benzonatate, bisacodyl, butalbital-acetaminophen-caffeine, chlorpheniramine-HYDROcodone, clonazePAM, diphenhydrAMINE, guaiFENesin-dextromethorphan, ipratropium-albuterol, nitroGLYCERIN,  ondansetron (ZOFRAN) IV, oxyCODONE, senna-docusate  Allergies:    Allergies  Allergen Reactions  . Trazodone And Nefazodone   . Viread [Tenofovir Disoproxil]     Damages kidneys  . Etodolac Rash    Social History:   Social History   Socioeconomic History  . Marital status: Single    Spouse name: Not on file  . Number of children: Not on file  . Years of education: 41  . Highest education level: GED or equivalent  Occupational History  . Not on file  Social Needs  . Financial resource strain: Not hard at all  . Food insecurity:    Worry: Sometimes true    Inability: Never true  . Transportation needs:    Medical: No    Non-medical: No  Tobacco Use  . Smoking status: Never Smoker  . Smokeless tobacco: Never Used  Substance and Sexual Activity  . Alcohol use: No  . Drug use: No  . Sexual activity: Never  Lifestyle  . Physical activity:    Days per week: 0 days    Minutes per session: 0 min  . Stress: To some extent  Relationships  . Social connections:    Talks on phone: More than three times a week    Gets together: Three times a week    Attends religious service: Never    Active member of club or organization: No    Attends meetings of clubs or organizations: Never    Relationship status: Never married  . Intimate partner violence:    Fear of current or ex partner: Patient refused    Emotionally abused: Patient refused    Physically abused: Patient refused    Forced sexual activity: Patient refused  Other Topics Concern  . Not on file  Social History Narrative  . Not on file    Family History:     Family History  Problem Relation Age of Onset  . Heart attack Mother   . Diabetes Mellitus II Mother   . Mental illness Mother   . CAD Mother   . Heart attack Father   . CAD Father   . Hypertension Father      ROS:  Please see the history of present illness.    All other ROS reviewed and negative.     Physical Exam/Data:   Vitals:   04/27/18  1952 04/27/18 2028 04/28/18 0250 04/28/18 0746  BP: (!) 148/70  111/83   Pulse: (!) 113  (!) 102   Resp:   (!) 24   Temp:  98.6 F (37 C)   TempSrc:   Oral   SpO2:  96% 97% 90%  Weight:      Height:        Intake/Output Summary (Last 24 hours) at 04/28/2018 0802 Last data filed at 04/28/2018 0250 Gross per 24 hour  Intake 0 ml  Output 200 ml  Net -200 ml   Filed Weights   04/26/18 0003  Weight: 93 kg   Body mass index is 32.11 kg/m.  General:  Well nourished, well developed, in no acute distress  HEENT: normal Lymph: no adenopathy Neck: no JVD Endocrine:  No thryomegaly Vascular: No carotid bruits; FA pulses 2+ bilaterally without bruits  Cardiac:  normal S1, S2; RRR; no murmur no rubs. Lungs: Decreased breath sounds bilaterally with occasional rhonchi.  No wheezes. Abd: soft, nontender, no hepatomegaly  Ext: no edema Musculoskeletal:  No deformities, BUE and BLE strength normal and equal Skin: warm and dry  Neuro:  CNs 2-12 intact, no focal abnormalities noted Psych:  Normal affect   EKG:  The EKG was personally reviewed and demonstrates: Normal sinus rhythm at a rate of 86 bpm no ischemia Telemetry:  Telemetry was personally reviewed and demonstrates: Sinus rhythm no atrial fibrillation  Relevant CV Studies: Echocardiogram done March 25, 2018 during previous admission revealing preserved LV function with ejection fraction 55 to 60%, small pericardial effusion with no hemodynamic compromise.  Laboratory Data:  Chemistry Recent Labs  Lab 04/26/18 1811 04/27/18 0359 04/28/18 0634  NA 137 140 141  K 3.8 3.5 3.6  CL 108 105 105  CO2 22 26 27   GLUCOSE 246* 238* 117*  BUN 16 17 15   CREATININE 1.59* 1.54* 1.52*  CALCIUM 8.0* 7.9* 8.3*  GFRNONAA 46* 48* 48*  GFRAA 53* 55* 56*  ANIONGAP 7 9 9     Recent Labs  Lab 04/24/18 1444 04/25/18 0316  PROT 7.3 7.1  ALBUMIN 3.3* 3.3*  AST 12* 13*  ALT 16 16  ALKPHOS 92 85  BILITOT 1.0 0.8    Hematology Recent Labs  Lab 04/26/18 1811 04/27/18 0359 04/28/18 0634  WBC 14.1* 15.1* 9.5  RBC 3.15* 2.96* 2.91*  HGB 9.3* 8.7* 8.5*  HCT 29.4* 27.9* 27.2*  MCV 93.3 94.3 93.5  MCH 29.5 29.4 29.2  MCHC 31.6 31.2 31.3  RDW 15.8* 15.6* 15.7*  PLT 167 151 168   Cardiac Enzymes Recent Labs  Lab 04/25/18 0316 04/25/18 0805 04/26/18 0423  TROPONINI 0.05* 0.03* 0.03*   No results for input(s): TROPIPOC in the last 168 hours.  BNPNo results for input(s): BNP, PROBNP in the last 168 hours.  DDimer No results for input(s): DDIMER in the last 168 hours.  Radiology/Studies:  Dg Chest 2 View  Result Date: 04/24/2018 CLINICAL DATA:  Dry cough, fever, and chills since yesterday. Patient also states SOB on exertion. Patient states that he was discharged from hospital 10 days ago where he was admitted with pneumonia. Hx of CHF, diabetes, Kaposi's sarcoma, HIV. EXAM: CHEST - 2 VIEW COMPARISON:  04/12/2018 and older exams FINDINGS: There is increased opacity at the left base now obscuring the left heart border as well as the hemidiaphragm. Remainder of the lungs is clear. Small to moderate left pleural effusion. No right pleural effusion. No pneumothorax. Cardiac silhouette is normal in size and configuration. No mediastinal or hilar masses. No evidence of adenopathy. Skeletal structures are unremarkable. IMPRESSION: 1. Increased opacity at the left lung base since the prior exams. This is consistent with a combination of  pleural fluid with atelectasis and/or pneumonia. 2. No other abnormality.  No pulmonary edema. Electronically Signed   By: Lajean Manes M.D.   On: 04/24/2018 16:01   Dg Chest Left Decubitus  Result Date: 04/26/2018 CLINICAL DATA:  57 year old male. Follow-up pleural effusions. Subsequent encounter. EXAM: CHEST - LEFT DECUBITUS COMPARISON:  04/25/2018 chest x-ray. FINDINGS: Left-side down decubitus view of the chest reveals left pleural fluid layers in a dependent position.  IMPRESSION: Left pleural effusion layers on decubitus view. Electronically Signed   By: Genia Del M.D.   On: 04/26/2018 15:01   Dg Chest Port 1 View  Result Date: 04/27/2018 CLINICAL DATA:  Status post thoracentesis. EXAM: PORTABLE CHEST 1 VIEW COMPARISON:  Ultrasound 04/27/2018.  Chest x-ray 04/26/2018. FINDINGS: Interval left-sided thoracentesis. Decrease in left-sided pleural effusion. Left base atelectasis. No pneumothorax. Stable cardiomegaly. IMPRESSION: 1. Interval left thoracentesis with decrease in left-sided pleural effusion. Left base atelectasis. No pneumothorax. 2.  Stable cardiomegaly. Electronically Signed   By: Marcello Moores  Register   On: 04/27/2018 10:42   Dg Chest Port 1 View  Result Date: 04/25/2018 CLINICAL DATA:  57 y/o  M; cough and shortness of breath. EXAM: PORTABLE CHEST 1 VIEW COMPARISON:  04/24/2018 chest radiograph. FINDINGS: Normal cardiac silhouette. Stable small to moderate left pleural effusion and basilar opacity. No new consolidation. No pneumothorax. No acute osseous abnormality is evident. IMPRESSION: Stable small to moderate left pleural effusion and basilar opacity which may represent associated atelectasis or pneumonia. Electronically Signed   By: Kristine Garbe M.D.   On: 04/25/2018 03:59   US Thoracentesis Asp Pleural Space W/img Guide  Result Date: 04/27/2018 INDICATION: Left pleural effusion EXAM: ULTRASOUND GUIDED LEFT THORACENTESIS MEDICATIONS: None. COMPLICATIONS: None immediate. PROCEDURE: An ultrasound guided thoracentesis was thoroughly discussed with the patient and questions answered. The benefits, risks, alternatives and complications were also discussed. The patient understands and wishes to proceed with the procedure. Written consent was obtained. Ultrasound was performed to localize and mark an adequate pocket of fluid in the left chest. The area was then prepped and draped in the normal sterile fashion. 1% Lidocaine was used for local  anesthesia. Under ultrasound guidance a 6 Fr Safe-T-Centesis catheter was introduced. Thoracentesis was performed. The catheter was removed and a dressing applied. FINDINGS: A total of approximately 1200 cc of clear yellow fluid was removed. Samples were sent to the laboratory as requested by the clinical team. IMPRESSION: Successful ultrasound guided left thoracentesis yielding 1200 cc of pleural fluid. Electronically Signed   By: Marybelle Killings M.D.   On: 04/27/2018 10:04    Assessment and Plan:   1. Patient with history of atrial fibrillation with recurrent shortness of breath and pleural and small pericardial effusions.  Status post thoracentesis of a large pleural effusion.  Symptoms.  Concern over possible amiodarone etiology.  Will discontinue amiodarone and follow rhythm however rate controlled by placing the patient on metoprolol 25 mg tartrate twice daily.  Will discontinue carvedilol as well which was added.  Metoprolol will be better for rate control and rhythm control.  Would not treat with chronic anticoagulation due to bleeding risk. 2. Pleural effusion-status post thoracentesis.  Will follow for recurrence. 3.  Pericardial effusion-small pericardial effusion not clinically significant.  Will follow off amiodarone for any improvement. 4.  Renal insufficiency-appears to be at baseline.  Appreciate nephrology input.          Signed, Teodoro Spray, MD  04/28/2018 8:02 AM

## 2018-04-28 NOTE — Progress Notes (Signed)
[facility]  ROUNDING NOTE   Subjective:   Patient states he is breathing better  Amiodarone discontinued.   Objective:  Vital signs in last 24 hours:  Temp:  [98.3 F (36.8 C)-103.1 F (39.5 C)] 99.2 F (37.3 C) (10/30 0832) Pulse Rate:  [88-131] 88 (10/30 0832) Resp:  [18-32] 18 (10/30 0832) BP: (103-148)/(39-88) 117/65 (10/30 0832) SpO2:  [90 %-97 %] 94 % (10/30 0832)  Weight change:  Filed Weights   04/26/18 0003  Weight: 93 kg    Intake/Output: I/O last 3 completed shifts: In: 0  Out: 975 [Urine:975]   Intake/Output this shift:  No intake/output data recorded.  Physical Exam: General: NAD,   Head: Normocephalic, atraumatic. Moist oral mucosal membranes  Eyes: Anicteric, PERRL  Neck: Supple, trachea midline  Lungs:  Clear to auscultation  Heart: Regular rate and rhythm  Abdomen:  Soft, nontender,   Extremities:  no peripheral edema.  Neurologic: Nonfocal, moving all four extremities  Skin: No lesions        Basic Metabolic Panel: Recent Labs  Lab 04/24/18 1444 04/25/18 0316 04/25/18 0548 04/26/18 0423 04/26/18 0427 04/26/18 1811 04/27/18 0359 04/28/18 0634  NA 140 138  --   --   --  137 140 141  K 3.6 3.7  --   --   --  3.8 3.5 3.6  CL 103 102  --   --   --  108 105 105  CO2 23 25  --   --   --  22 26 27   GLUCOSE 157* 214*  --   --   --  246* 238* 117*  BUN 14 17  --   --   --  16 17 15   CREATININE 1.30* 1.41*  --   --   --  1.59* 1.54* 1.52*  CALCIUM 8.9 8.9  --   --   --  8.0* 7.9* 8.3*  MG  --   --  0.8* 2.1  --   --   --   --   PHOS  --   --  1.8*  --  2.3*  --   --   --     Liver Function Tests: Recent Labs  Lab 04/24/18 1444 04/25/18 0316  AST 12* 13*  ALT 16 16  ALKPHOS 92 85  BILITOT 1.0 0.8  PROT 7.3 7.1  ALBUMIN 3.3* 3.3*   No results for input(s): LIPASE, AMYLASE in the last 168 hours. No results for input(s): AMMONIA in the last 168 hours.  CBC: Recent Labs  Lab 04/24/18 1444 04/25/18 0316  04/26/18 1811 04/27/18 0359 04/28/18 0634  WBC 12.8* 12.6* 14.1* 15.1* 9.5  NEUTROABS 9.3* 10.2*  --   --   --   HGB 11.7* 11.4* 9.3* 8.7* 8.5*  HCT 37.2* 35.4* 29.4* 27.9* 27.2*  MCV 93.2 92.2 93.3 94.3 93.5  PLT 237 243 167 151 168    Cardiac Enzymes: Recent Labs  Lab 04/25/18 0316 04/25/18 0805 04/26/18 0423  TROPONINI 0.05* 0.03* 0.03*    BNP: Invalid input(s): POCBNP  CBG: Recent Labs  Lab 04/27/18 0739 04/27/18 1135 04/27/18 1626 04/27/18 2111 04/28/18 0829  GLUCAP 150* 164* 162* 170* 57*    Microbiology: Results for orders placed or performed during the hospital encounter of 04/25/18  Blood Culture (routine x 2)     Status: None (Preliminary result)   Collection Time: 04/25/18  3:20 AM  Result Value Ref Range Status   Specimen Description BLOOD RIGHT ANTECUBITAL  Final   Special Requests   Final    BOTTLES DRAWN AEROBIC AND ANAEROBIC Blood Culture results may not be optimal due to an excessive volume of blood received in culture bottles   Culture   Final    NO GROWTH 3 DAYS Performed at [facility], [address], [place], [place] [address]    Report Status PENDING  Incomplete  Blood Culture (routine x 2)     Status: None (Preliminary result)   Collection Time: 04/25/18  3:54 AM  Result Value Ref Range Status   Specimen Description BLOOD BLOOD LEFT HAND  Final   Special Requests   Final    BOTTLES DRAWN AEROBIC AND ANAEROBIC Blood Culture results may not be optimal due to an excessive volume of blood received in culture bottles   Culture   Final    NO GROWTH 3 DAYS Performed at [facility], [address] [place], [place] [address]    Report Status PENDING  Incomplete  Urine culture     Status: None   Collection Time: 04/25/18  6:57 AM  Result Value Ref Range Status   Specimen Description   Final    URINE, RANDOM Performed at [facility], [address], [place], [place] [address]    Special Requests   Final     NONE Performed at [facility], [address], [place], [place] [address]    Culture   Final    NO GROWTH Performed at [facility], [place] [address], [place], [place] [address]    Report Status 04/26/2018 FINAL  Final  MRSA PCR Screening     Status: None   Collection Time: 04/26/18  1:08 AM  Result Value Ref Range Status   MRSA by PCR NEGATIVE NEGATIVE Final    Comment:        The GeneXpert MRSA Assay (FDA approved for NASAL specimens only), is one component of a comprehensive MRSA colonization surveillance program. It is not intended to diagnose MRSA infection nor to guide or monitor treatment for MRSA infections. Performed at [facility], [address] [place], [place] [address]   Body fluid culture     Status: None (Preliminary result)   Collection Time: 04/27/18  9:50 AM  Result Value Ref Range Status   Specimen Description   Final    PLEURAL Performed at [facility], [address] [place], [place] [address]    Special Requests   Final    NONE Performed at [facility], [address] [place], [place] [address]    Gram Stain   Final    RARE WBC PRESENT, PREDOMINANTLY PMN NO ORGANISMS SEEN Performed at [facility], [place] [address] [place], [place] [address]    Culture PENDING  Incomplete   Report Status PENDING  Incomplete    Coagulation Studies: No results for input(s): LABPROT, INR in the last 72 hours.  Urinalysis: No results for input(s): COLORURINE, LABSPEC, PHURINE, GLUCOSEU, HGBUR, BILIRUBINUR, KETONESUR, PROTEINUR, UROBILINOGEN, NITRITE, LEUKOCYTESUR in the last 72 hours.  Invalid input(s): APPERANCEUR    Imaging: Dg Chest Left Decubitus  Result Date: 04/26/2018 CLINICAL DATA:  57 year old male. Follow-up pleural effusions. Subsequent encounter. EXAM: CHEST - LEFT DECUBITUS COMPARISON:  04/25/2018 chest x-ray. FINDINGS: Left-side down decubitus view of the chest reveals left pleural  fluid layers in a dependent position. IMPRESSION: Left pleural effusion layers on decubitus view. Electronically Signed   By: Alcide Evener.D.  On: 04/26/2018 15:01   Dg Chest Port 1 View  Result Date: 04/27/2018 CLINICAL DATA:  Status post thoracentesis. EXAM: PORTABLE CHEST 1 VIEW COMPARISON:  Ultrasound 04/27/2018.  Chest x-ray 04/26/2018. FINDINGS: Interval left-sided thoracentesis. Decrease in left-sided pleural effusion. Left base atelectasis. No pneumothorax. Stable cardiomegaly. IMPRESSION: 1. Interval left thoracentesis with decrease in left-sided pleural effusion. Left base atelectasis. No pneumothorax. 2.  Stable cardiomegaly. Electronically Signed   By: Marcello Moores  Register   On: 04/27/2018 10:42   US Thoracentesis Asp Pleural Space W/img Guide  Result Date: 04/27/2018 INDICATION: Left pleural effusion EXAM: ULTRASOUND GUIDED LEFT THORACENTESIS MEDICATIONS: None. COMPLICATIONS: None immediate. PROCEDURE: An ultrasound guided thoracentesis was thoroughly discussed with the patient and questions answered. The benefits, risks, alternatives and complications were also discussed. The patient understands and wishes to proceed with the procedure. Written consent was obtained. Ultrasound was performed to localize and mark an adequate pocket of fluid in the left chest. The area was then prepped and draped in the normal sterile fashion. 1% Lidocaine was used for local anesthesia. Under ultrasound guidance a 6 Fr Safe-T-Centesis catheter was introduced. Thoracentesis was performed. The catheter was removed and a dressing applied. FINDINGS: A total of approximately 1200 cc of clear yellow fluid was removed. Samples were sent to the laboratory as requested by the clinical team. IMPRESSION: Successful ultrasound guided left thoracentesis yielding 1200 cc of pleural fluid. Electronically Signed   By: Marybelle Killings M.D.   On: 04/27/2018 10:04     Medications:    . abacavir-dolutegravir-lamiVUDine  1  tablet Oral Daily  . aspirin EC  325 mg Oral Daily  . budesonide (PULMICORT) nebulizer solution  1 mg Nebulization BID  . divalproex  500 mg Oral BID  . enoxaparin (LOVENOX) injection  40 mg Subcutaneous Q24H  . feeding supplement (NEPRO CARB STEADY)  237 mL Oral BID BM  . ibuprofen  200 mg Oral Once  . iloperidone  12 mg Oral BID  . insulin aspart  0-15 Units Subcutaneous TID WC  . insulin aspart  0-5 Units Subcutaneous QHS  . insulin aspart  4 Units Subcutaneous TID WC  . losartan  25 mg Oral Daily  . metoprolol tartrate  25 mg Oral BID  . mirtazapine  30 mg Oral QHS  . pantoprazole  40 mg Oral BID  . rosuvastatin  40 mg Oral Daily  . sucralfate  1 g Oral BID  . venlafaxine XR  75 mg Oral Q breakfast  . cyanocobalamin  1,000 mcg Oral Daily   acetaminophen, benzonatate, bisacodyl, butalbital-acetaminophen-caffeine, chlorpheniramine-HYDROcodone, clonazePAM, diphenhydrAMINE, guaiFENesin-dextromethorphan, ipratropium-albuterol, nitroGLYCERIN, ondansetron (ZOFRAN) IV, oxyCODONE, senna-docusate  Assessment/ Plan:  [name]. is a 57 y.o. 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   1. 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 However unclear if this is due to a secondary cause. FSGS can be due to HIV, NSAIDs, obesity etc.  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  Baseline proteinuria is subnephrotic at 1.1 grams on 02/17/18.  Baseline creatinine of 1.2-1.4 Enalapril 2.5 mg daily - changed to ARB due to cough, losartan 25mg  daily.   2. Hypertension:  Blood pressure currently at goal.  - losartan as above  3. Anemia with chronic kidney disease: hemoglobin  8.5. Normocytic.   4. Pneumonia: with fever and white count. Status post thoracentesis on 10/29.  - Appreciate Rhem, Pulm, Cards and ID input   LOS: 3 Ginnifer Creelman 10/30/20198:53 AM

## 2018-04-28 NOTE — Clinical Social Work Note (Signed)
Clinical Social Work Assessment  Patient Details  Name: Danny Cook. MRN: [id number] Date of Birth: January 20, 1961  Date of referral:  04/28/18               Reason for consult:  Facility Placement                Permission sought to share information with:  Family Supports, Customer service manager Permission granted to share information::  Yes, Verbal Permission Granted  Name::     Danny Cook   [phone] or Danny Cook Father [phone]   Agency::  [facility] owner Danny Cook family care home owner [phone]  Relationship::     Contact Information:     Housing/Transportation Living arrangements for the past 2 months:  Danny Cook of Information:  Patient Patient Interpreter Needed:  None Criminal Activity/Legal Involvement Pertinent to Current Situation/Hospitalization:  No - Comment as needed Significant Relationships:  Warehouse manager, Other Family Members Lives with:  Facility Resident Do you feel safe going back to the place where you live?  Yes Need for family participation in patient care:  No (Coment)  Care giving concerns:  Patient did not express any concerns about returning back to his group home neither did Danny Cook, the family care home owner.   Social Worker assessment / plan:  Patient is a 57 year old male who is alert and oriented x4.  [facility], patient stated he has only been there about a month, but he is happy with his arrangements.  Patient stated that he has 5 other roommates, but he has his own room.  He feels like he gets alone well with his roommates, and all his needs are being met.  Patient stated he is able to get to all of his appointments, and gets all of his medications.  Patient was explained role of [name] and process for coordinating for him to get back to group home.  Patient asked [name] when he will be discharging back to family care home, and [name] informed him it's up  to the physician.   Employment status:  Disabled (Comment on whether or not currently receiving Disability) Insurance information:  Medicare PT Recommendations:  Not assessed at this time Information / Referral to community resources:     Patient/Family's Response to care:  Patient is in agreement to returning back to family care home.  Patient/Family's Understanding of and Emotional Response to Diagnosis, Current Treatment, and Prognosis:  Patient expressed he is ready to go home, and hopes it won't be much longer.  Emotional Assessment Appearance:  Appears older than stated age Attitude/Demeanor/Rapport:    Affect (typically observed):  Appropriate, Stable, Pleasant Orientation:  Oriented to Self, Oriented to Place, Oriented to  Time, Oriented to Situation Alcohol / Substance use:  Not Applicable Psych involvement (Current and /or in the community):  No (Comment)  Discharge Needs  Concerns to be addressed:  Care Coordination Readmission within the last 30 days:  Yes Current discharge risk:  None Barriers to Discharge:  Continued Medical Work up   Danny Cook 04/28/2018, 5:16 PM

## 2018-04-28 NOTE — Care Management Important Message (Signed)
Copy of signed IM left with patient in room.  

## 2018-04-28 NOTE — Progress Notes (Signed)
Danny Cook. is a 57 y.o. malewith history of HIV, CHF, chronic kidney disease(FSGN), schizophrenia  is admitted with cough, fever . Pt has had recurrent hospitalization for the same complaint. Was recently in [facility] between 10/14-10/16 and treated as Pneumonia with IV vanco+ cefepime and then sent home on cefdinir eventhough CT chest did not show any pneumonia (left pleural effusion and atelectasis) and N procal) He also received steroids which made him feel much better- He was sent home on 3 days of steroids and since stopping it he started to get fever, cough and chills, No sputum, no hemoptysis, no chest pain,  Has headaches. He currently lives in [facility] care assisted living  Prior to this was hospitalized between 9/21-9/2 6 for dizziness, nausea ,headaches and cough and found to be hypotensive, AKI on CKD and was in ICU and treated with fluids and pressors. 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 As there was no tamponade no pericardiocentesis was done. He also had high ESR and was seen by rheumatologist and work up including ANA, RH factor, ANCA were negative. HE was treated with IV antibiotics for a few days and also was given antibiotics along with steroids  for possible meningitis   for a day  as he had become drowsy in the ICU. All the antibiotics were stopped as there was no evidence clinically of meningitis or encephalitis. CT scan of the head, MRI of the brain and MRA of the brain was negative for acute pathology. Carotid ultrasound negative. EEG negative for seizures. Mental status improved. The blood cultures were also negative. AKI was thought to be due to volume depletion due to JArdiance and lasix which were stopped by his PCP a day prior to admission  Pt was also very anxious from his underlying mental health problem.  After discharge on 9/28 he returned to the ED on 9/30  complaining of feeling anxious and sob. He did not want to live alone as it was making him anxious and so he was sent to an assisted living.    Subjective Still has chills Appetite fine No cough  Objective:  VITALS:  Patient Vitals for the past 24 hrs:  BP Temp Temp src Pulse Resp SpO2  04/28/18 1208 - 98.2 F (36.8 C) Oral - - -  04/28/18 0832 117/65 99.2 F (37.3 C) Oral 88 18 94 %  04/28/18 0746 - - - - - 90 %  04/28/18 0250 111/83 98.6 F (37 C) Oral (!) 102 (!) 24 97 %  04/27/18 2028 - - - - - 96 %  04/27/18 1952 (!) 148/70 - - (!) 113 - -  04/27/18 1942 (!) 107/39 (!) 103.1 F (39.5 C) Oral 100 (!) 32 96 %  04/27/18 1626 103/88 98.3 F (36.8 C) Oral (!) 131 - 94 %  PHYSICAL EXAM:  General: Alert, cooperative, no distress, appears stated age.  Head: Normocephalic, without obvious abnormality, atraumatic. Eyes: Conjunctivae clear, anicteric sclerae. Pupils are equal ENT Nares normal. No drainage or sinus tenderness. Lips, mucosa, and tongue normal. No Thrush Neck: Supple, symmetrical, no adenopathy, thyroid: non tender no carotid bruit and no JVD. Back: No CVA tenderness. Lungs: b/l air entry, decreased left base Heart: Regular rate and rhythm, no murmur, rub or gallop. Abdomen: Soft, non-tender,not distended. Bowel sounds normal. No masses Extremities: atraumatic, no cyanosis. No edema. No clubbing Skin: No rashes or lesions. Or  bruising Lymph: Cervical, supraclavicular normal. Neurologic: Grossly non-focal Pertinent Labs Lab Results CBC    Component Value Date/Time   WBC 9.5 04/28/2018 0634   RBC 2.91 (L) 04/28/2018 0634   HGB 8.5 (L) 04/28/2018 0634   HGB 9.5 (L) 03/23/2018 2341   HCT 27.2 (L) 04/28/2018 0634   HCT 29.5 (L) 03/23/2018 2341   PLT 168 04/28/2018 0634   PLT 156 03/23/2018 2341   MCV 93.5 04/28/2018 0634   MCV 95 03/23/2018 2341   MCV 93 10/23/2014 1719   MCH 29.2 04/28/2018 0634   MCHC 31.3 04/28/2018 0634   RDW 15.7 (H) 04/28/2018 0634     RDW 14.9 03/23/2018 2341   RDW 13.0 10/23/2014 1719   LYMPHSABS 1.1 04/25/2018 0316   LYMPHSABS 2.9 03/23/2018 2341   LYMPHSABS 2.3 12/27/2012 0550   MONOABS 1.1 (H) 04/25/2018 0316   MONOABS 0.7 12/27/2012 0550   EOSABS 0.0 04/25/2018 0316   EOSABS 0.1 03/23/2018 2341   EOSABS 0.3 12/27/2012 0550   BASOSABS 0.0 04/25/2018 0316   BASOSABS 0.0 03/23/2018 2341   BASOSABS 0.1 12/27/2012 0550    CMP Latest Ref Rng & Units 04/28/2018 04/27/2018 04/26/2018  Glucose 70 - 99 mg/dL 117(H) 238(H) 246(H)  BUN 6 - 20 mg/dL _0 Creatinine 0.61 - 1.24 mg/dL 1.52(H) 1.54(H) 1.59(H)  Sodium 135 - 145 mmol/L 141 140 137  Potassium 3.5 - 5.1 mmol/L 3.6 3.5 3.8  Chloride 98 - 111 mmol/L 105 105 108  CO2 22 - 32 mmol/L _1 Calcium 8.9 - 10.3 mg/dL 8.3(L) 7.9(L) 8.0(L)  Total Protein 6.5 - 8.1 g/dL - - -  Total Bilirubin 0.3 - 1.2 mg/dL - - -  Alkaline Phos 38 - 126 U/L - - -  AST 15 - 41 U/L - - -  ALT 0 - 44 U/L - - -      Microbiology: Recent Results (from the past 240 hour(s))  Culture, blood (routine x 2)     Status: None (Preliminary result)   Collection Time: 04/24/18  7:06 PM  Result Value Ref Range Status   Specimen Description BLOOD LEFT HAND  Final   Special Requests   Final    BOTTLES DRAWN AEROBIC AND ANAEROBIC Blood Culture results may not be optimal due to an excessive volume of blood received in culture bottles   Culture   Final    NO GROWTH 4 DAYS Performed at [facility], [address], [place], [place] [address]    Report Status PENDING  Incomplete  Culture, blood (routine x 2)     Status: None (Preliminary result)   Collection Time: 04/24/18  7:40 PM  Result Value Ref Range Status   Specimen Description BLOOD LEFT ANTECUBITAL  Final   Special Requests   Final    BOTTLES DRAWN AEROBIC AND ANAEROBIC Blood Culture results may not be optimal due to an excessive volume of blood received in culture bottles   Culture   Final    NO GROWTH 4  DAYS Performed at [facility], [address] [place],  [address]    Report Status PENDING  Incomplete  Blood Culture (routine x 2)     Status: None (Preliminary result)   Collection Time: 04/25/18  3:20 AM  Result Value Ref Range Status   Specimen Description BLOOD RIGHT ANTECUBITAL  Final   Special Requests   Final    BOTTLES DRAWN AEROBIC AND ANAEROBIC Blood Culture results may not be  optimal due to an excessive volume of blood received in culture bottles   Culture   Final    NO GROWTH 3 DAYS Performed at [facility], [address] [place], [place] [address]    Report Status PENDING  Incomplete  Blood Culture (routine x 2)     Status: None (Preliminary result)   Collection Time: 04/25/18  3:54 AM  Result Value Ref Range Status   Specimen Description BLOOD BLOOD LEFT HAND  Final   Special Requests   Final    BOTTLES DRAWN AEROBIC AND ANAEROBIC Blood Culture results may not be optimal due to an excessive volume of blood received in culture bottles   Culture   Final    NO GROWTH 3 DAYS Performed at [facility], [address], [place], [place] [address]    Report Status PENDING  Incomplete  Urine culture     Status: None   Collection Time: 04/25/18  6:57 AM  Result Value Ref Range Status   Specimen Description   Final    URINE, RANDOM Performed at [facility], [address], [place], [place] [address]    Special Requests   Final    NONE Performed at [facility], [address], [place], [place] [address]    Culture   Final    NO GROWTH Performed at [facility], [place] [address] [place], [place] [address]    Report Status 04/26/2018 FINAL  Final  MRSA PCR Screening     Status: None   Collection Time: 04/26/18  1:08 AM  Result Value Ref Range Status   MRSA by PCR NEGATIVE NEGATIVE Final    Comment:        The GeneXpert MRSA Assay (FDA approved for NASAL specimens only), is one component of  a comprehensive MRSA colonization surveillance program. It is not intended to diagnose MRSA infection nor to guide or monitor treatment for MRSA infections. Performed at [facility], [address] [place], [place] [address]   Body fluid culture     Status: None (Preliminary result)   Collection Time: 04/27/18  9:50 AM  Result Value Ref Range Status   Specimen Description   Final    PLEURAL Performed at [facility], [address] [place], [place] [address]    Special Requests   Final    NONE Performed at [facility], [address] [place], [place] [address]    Gram Stain   Final    RARE WBC PRESENT, PREDOMINANTLY PMN NO ORGANISMS SEEN    Culture   Final    NO GROWTH < 24 HOURS Performed at [facility], [place] [address], [place],  [address]    Report Status PENDING  Incomplete   IMAGING RESULTS: ? I  Impression/Recommendation  57 y.o. malewith history of HIV, CHF, chronic kidney disease(FSGN), schizophrenia  is admitted with cough, fever . Pt has had recurrent hospitalization for the same complaint.  ?Recurrent visits to ED and hospitalization with constellation of symptoms like cough, Fever SOB, headache, anxiety Not clear what the underlying pathology is?  He has following positive  findings Fever Anemia since Sept 2019 High ESR > 100 Pericardial effusion  pleural effusion Serum protein electrophoresis shows low M spike 0.4 indeterminate quantiferon gold  Neg ANA, ANCA, normal ferritin, ACE   D.D ? autoimmuneVS malignancy VS infection VS drug induced ?Sarcoid ?TRAPS ? FMF ( no genetic predisposition) Need to r/o body cavity lymphoma  sarcoid TB- afb pleural fluid culture  ( pleural fluid analysis  is not suggestive of pleural TB ( not an exudate, no lymphocytosis)-  Appreciate pulmonologist recommendation- adenosine de-aminase added-   Left pleural effusion with atelectasis- He does not have pneumonia (  was treated as one in the last admission and he responded to steroids rather than to antibiotics last admission)  Pleurocentesis- sent for bacterial, afb ( added) , fungal culture and cytology, cell count and chemistry Not suggestive of exudate,no lymphocyte predominance  Need pleural/pericardial biopsy  HHV8  Fluctuating renal function  Adrenal adenoma    Ascending thoracic Aortic aneurysm 4.3 cm?   Afib on amiodarone-  Has been stopped today _ HIV- well controlled on Triumeq- VL < 20 and cd4 >1000 Pt has been on Triumeq since 2016 and before that Epzicom- so I doubt that abacavir is causing fever __________________________________________________ Discussed with patient and care team

## 2018-04-29 ENCOUNTER — Inpatient Hospital Stay: Payer: Medicare Other

## 2018-04-29 ENCOUNTER — Inpatient Hospital Stay: Payer: Self-pay | Admitting: Infectious Diseases

## 2018-04-29 DIAGNOSIS — J90 Pleural effusion, not elsewhere classified: Secondary | ICD-10-CM

## 2018-04-29 DIAGNOSIS — R509 Fever, unspecified: Secondary | ICD-10-CM

## 2018-04-29 DIAGNOSIS — D472 Monoclonal gammopathy: Secondary | ICD-10-CM

## 2018-04-29 LAB — CK: Total CK: 23 U/L — ABNORMAL LOW (ref 49–397)

## 2018-04-29 LAB — GLUCOSE, CAPILLARY
GLUCOSE-CAPILLARY: 236 mg/dL — AB (ref 70–99)
GLUCOSE-CAPILLARY: 225 mg/dL — AB (ref 70–99)
Glucose-Capillary: 120 mg/dL — ABNORMAL HIGH (ref 70–99)
Glucose-Capillary: 159 mg/dL — ABNORMAL HIGH (ref 70–99)

## 2018-04-29 LAB — CULTURE, BLOOD (ROUTINE X 2)
Culture: NO GROWTH
Culture: NO GROWTH

## 2018-04-29 LAB — HEPATIC FUNCTION PANEL
ALK PHOS: 72 U/L (ref 38–126)
ALT: 35 U/L (ref 0–44)
AST: 53 U/L — AB (ref 15–41)
Albumin: 2.4 g/dL — ABNORMAL LOW (ref 3.5–5.0)
Total Bilirubin: 0.4 mg/dL (ref 0.3–1.2)
Total Protein: 6.7 g/dL (ref 6.5–8.1)

## 2018-04-29 LAB — LACTATE DEHYDROGENASE: LDH: 137 U/L (ref 98–192)

## 2018-04-29 LAB — CYTOLOGY - NON PAP

## 2018-04-29 MED ORDER — ACETAMINOPHEN 325 MG PO TABS: 650.0000 mg | ORAL_TABLET | Freq: Four times a day (QID) | ORAL | Status: DC | PRN

## 2018-04-29 MED ORDER — SODIUM CHLORIDE 0.9 % IV SOLN
INTRAVENOUS | Status: DC
  Administered 2018-04-30: 06:00:00 via INTRAVENOUS

## 2018-04-29 MED ORDER — IOPAMIDOL (ISOVUE-300) INJECTION 61%
15.0000 mL | INTRAVENOUS | Status: AC
  Administered 2018-04-29 (×2): 15 mL via ORAL

## 2018-04-29 NOTE — Progress Notes (Signed)
Patient Name: Danny Cook. Date of Encounter: 04/29/2018  Hospital Problem List     Active Problems:   Healthcare associated bacterial pneumonia   FSGS (focal segmental glomerulosclerosis)    Patient Profile      Danny Cook. is a 57 y.o. male with a hx of HIV disease, diabetes, FSG induced chronic kidney disease, history of paroxysmal atrial fibrillation  Subjective   Feels better. Less sob  Inpatient Medications    . abacavir-dolutegravir-lamiVUDine  1 tablet Oral Daily  . aspirin EC  325 mg Oral Daily  . budesonide (PULMICORT) nebulizer solution  1 mg Nebulization BID  . divalproex  500 mg Oral BID  . enoxaparin (LOVENOX) injection  40 mg Subcutaneous Q24H  . feeding supplement (NEPRO CARB STEADY)  237 mL Oral BID BM  . ibuprofen  200 mg Oral Once  . iloperidone  12 mg Oral BID  . insulin aspart  0-15 Units Subcutaneous TID WC  . insulin aspart  0-5 Units Subcutaneous QHS  . insulin aspart  4 Units Subcutaneous TID WC  . losartan  25 mg Oral Daily  . metoprolol tartrate  25 mg Oral BID  . mirtazapine  30 mg Oral QHS  . pantoprazole  40 mg Oral BID  . rosuvastatin  40 mg Oral Daily  . sucralfate  1 g Oral BID  . venlafaxine XR  75 mg Oral Q breakfast  . cyanocobalamin  1,000 mcg Oral Daily    Vital Signs    Vitals:   04/28/18 2010 04/28/18 2037 04/28/18 2301 04/29/18 0404  BP: 122/64   (!) 106/58  Pulse: 92   74  Resp:    18  Temp: (!) 102.7 F (39.3 C)  98.4 F (36.9 C) 98.8 F (37.1 C)  TempSrc: Oral  Oral Oral  SpO2: 93% 92%  97%  Weight:      Height:       No intake or output data in the 24 hours ending 04/29/18 0832 Filed Weights   04/26/18 0003  Weight: 93 kg    Physical Exam    GEN: Well nourished, well developed, in no acute distress.  HEENT: normal.  Neck: Supple, no JVD, carotid bruits, or masses. Cardiac: RRR, no murmurs, rubs, or gallops. No clubbing, cyanosis, edema.  Radials/DP/PT 2+ and equal  bilaterally.  Respiratory:  Respirations regular and unlabored, clear to auscultation bilaterally. GI: Soft, nontender, nondistended, BS + x 4. MS: no deformity or atrophy. Skin: warm and dry, no rash. Neuro:  Strength and sensation are intact. Psych: Normal affect.  Labs    CBC Recent Labs    04/27/18 0359 04/28/18 0634  WBC 15.1* 9.5  HGB 8.7* 8.5*  HCT 27.9* 27.2*  MCV 94.3 93.5  PLT 151 379   Basic Metabolic Panel Recent Labs    04/27/18 0359 04/28/18 0634  NA 140 141  K 3.5 3.6  CL 105 105  CO2 26 27  GLUCOSE 238* 117*  BUN 17 15  CREATININE 1.54* 1.52*  CALCIUM 7.9* 8.3*   Liver Function Tests Recent Labs    04/28/18 1718  AST 53*  ALT 35  ALKPHOS 72  BILITOT 0.4  PROT 6.7  ALBUMIN 2.4*   No results for input(s): LIPASE, AMYLASE in the last 72 hours. Cardiac Enzymes No results for input(s): CKTOTAL, CKMB, CKMBINDEX, TROPONINI in the last 72 hours. BNP No results for input(s): BNP in the last 72 hours. D-Dimer No results for input(s): DDIMER in the  last 72 hours. Hemoglobin A1C No results for input(s): HGBA1C in the last 72 hours. Fasting Lipid Panel No results for input(s): CHOL, HDL, LDLCALC, TRIG, CHOLHDL, LDLDIRECT in the last 72 hours. Thyroid Function Tests No results for input(s): TSH, T4TOTAL, T3FREE, THYROIDAB in the last 72 hours.  Invalid input(s): FREET3  Telemetry    nsr  ECG    nsr  Radiology    Dg Chest 2 View  Result Date: 04/24/2018 CLINICAL DATA:  Dry cough, fever, and chills since yesterday. Patient also states SOB on exertion. Patient states that he was discharged from hospital 10 days ago where he was admitted with pneumonia. Hx of CHF, diabetes, Kaposi's sarcoma, HIV. EXAM: CHEST - 2 VIEW COMPARISON:  04/12/2018 and older exams FINDINGS: There is increased opacity at the left base now obscuring the left heart border as well as the hemidiaphragm. Remainder of the lungs is clear. Small to moderate left pleural  effusion. No right pleural effusion. No pneumothorax. Cardiac silhouette is normal in size and configuration. No mediastinal or hilar masses. No evidence of adenopathy. Skeletal structures are unremarkable. IMPRESSION: 1. Increased opacity at the left lung base since the prior exams. This is consistent with a combination of pleural fluid with atelectasis and/or pneumonia. 2. No other abnormality.  No pulmonary edema. Electronically Signed   By: Lajean Manes M.D.   On: 04/24/2018 16:01   Ct Chest Wo Contrast  Result Date: 04/14/2018 CLINICAL DATA:  56 year old male with a history of fever and cough EXAM: CT CHEST WITHOUT CONTRAST TECHNIQUE: Multidetector CT imaging of the chest was performed following the standard protocol without IV contrast. COMPARISON:  Chest x-ray 04/12/2018, CT 07/25/2017 FINDINGS: Cardiovascular: Heart size unchanged, not enlarged. New small pericardial fluid/thickening compared to the CT of 07/25/2017. Calcifications of the left anterior descending coronary artery. Unremarkable course caliber and contour of the thoracic aorta. Differential attenuation of the blood pool and the myocardium. Mediastinum/Nodes: Multiple lymph nodes of the mediastinum, potentially reactive. Unremarkable appearance of the thoracic esophagus. Unremarkable thoracic inlet. Lungs/Pleura: Small left-sided pleural effusion with associated atelectasis. No confluent airspace disease. No pneumothorax. No evidence of pulmonary edema. Upper Abdomen: No acute finding of the upper abdomen. Musculoskeletal: No acute displaced fracture. Degenerative changes of the spine. No bony canal narrowing. IMPRESSION: Small left pleural effusion with associated atelectasis. New small pericardial effusion compared to the prior CT. CT demonstrates evidence of anemia. Correlation with lab values may be useful. Single vessel coronary artery disease. Electronically Signed   By: Corrie Mckusick D.O.   On: 04/14/2018 14:19   Dg Chest Left  Decubitus  Result Date: 04/26/2018 CLINICAL DATA:  57 year old male. Follow-up pleural effusions. Subsequent encounter. EXAM: CHEST - LEFT DECUBITUS COMPARISON:  04/25/2018 chest x-ray. FINDINGS: Left-side down decubitus view of the chest reveals left pleural fluid layers in a dependent position. IMPRESSION: Left pleural effusion layers on decubitus view. Electronically Signed   By: Genia Del M.D.   On: 04/26/2018 15:01   US Venous Img Lower Bilateral  Result Date: 04/28/2018 CLINICAL DATA:  57 year old male with bilateral lower extremity edema EXAM: BILATERAL LOWER EXTREMITY VENOUS DOPPLER ULTRASOUND TECHNIQUE: Gray-scale sonography with graded compression, as well as color Doppler and duplex ultrasound were performed to evaluate the lower extremity deep venous systems from the level of the common femoral vein and including the common femoral, femoral, profunda femoral, popliteal and calf veins including the posterior tibial, peroneal and gastrocnemius veins when visible. The superficial great saphenous vein was also interrogated. Spectral Doppler  was utilized to evaluate flow at rest and with distal augmentation maneuvers in the common femoral, femoral and popliteal veins. COMPARISON:  None. FINDINGS: RIGHT LOWER EXTREMITY Common Femoral Vein: No evidence of thrombus. Normal compressibility, respiratory phasicity and response to augmentation. Increased pulsatility of the venous waveform. Saphenofemoral Junction: No evidence of thrombus. Normal compressibility and flow on color Doppler imaging. Profunda Femoral Vein: No evidence of thrombus. Normal compressibility and flow on color Doppler imaging. Femoral Vein: No evidence of thrombus. Normal compressibility, respiratory phasicity and response to augmentation. Popliteal Vein: No evidence of thrombus. Normal compressibility, respiratory phasicity and response to augmentation. Calf Veins: No evidence of thrombus. Normal compressibility and flow on color  Doppler imaging. Superficial Great Saphenous Vein: No evidence of thrombus. Normal compressibility. Venous Reflux:  None. Other Findings:  None. LEFT LOWER EXTREMITY Common Femoral Vein: No evidence of thrombus. Normal compressibility, respiratory phasicity and response to augmentation. Increased pulsatility of the venous waveform. Saphenofemoral Junction: No evidence of thrombus. Normal compressibility and flow on color Doppler imaging. Profunda Femoral Vein: No evidence of thrombus. Normal compressibility and flow on color Doppler imaging. Femoral Vein: No evidence of thrombus. Normal compressibility, respiratory phasicity and response to augmentation. Popliteal Vein: No evidence of thrombus. Normal compressibility, respiratory phasicity and response to augmentation. Calf Veins: No evidence of thrombus. Normal compressibility and flow on color Doppler imaging. Superficial Great Saphenous Vein: No evidence of thrombus. Normal compressibility. Venous Reflux:  None. Other Findings:  None. IMPRESSION: 1. No evidence of deep venous thrombosis in either lower extremity. 2. Increased pulsatility of the venous waveforms bilaterally suggesting elevated right heart pressures. Differential considerations include tricuspid regurgitation, right heart failure, COPD and pulmonary arterial hypertension. Electronically Signed   By: Jacqulynn Cadet M.D.   On: 04/28/2018 16:52   Dg Chest Port 1 View  Result Date: 04/27/2018 CLINICAL DATA:  Status post thoracentesis. EXAM: PORTABLE CHEST 1 VIEW COMPARISON:  Ultrasound 04/27/2018.  Chest x-ray 04/26/2018. FINDINGS: Interval left-sided thoracentesis. Decrease in left-sided pleural effusion. Left base atelectasis. No pneumothorax. Stable cardiomegaly. IMPRESSION: 1. Interval left thoracentesis with decrease in left-sided pleural effusion. Left base atelectasis. No pneumothorax. 2.  Stable cardiomegaly. Electronically Signed   By: Marcello Moores  Register   On: 04/27/2018 10:42   Dg  Chest Port 1 View  Result Date: 04/25/2018 CLINICAL DATA:  57 y/o  M; cough and shortness of breath. EXAM: PORTABLE CHEST 1 VIEW COMPARISON:  04/24/2018 chest radiograph. FINDINGS: Normal cardiac silhouette. Stable small to moderate left pleural effusion and basilar opacity. No new consolidation. No pneumothorax. No acute osseous abnormality is evident. IMPRESSION: Stable small to moderate left pleural effusion and basilar opacity which may represent associated atelectasis or pneumonia. Electronically Signed   By: Kristine Garbe M.D.   On: 04/25/2018 03:59   Dg Chest Port 1 View  Result Date: 04/12/2018 CLINICAL DATA:  Nonproductive cough, fever, chills EXAM: PORTABLE CHEST 1 VIEW COMPARISON:  03/29/2018 FINDINGS: Cardiomegaly with vascular congestion. Small left pleural effusion with left lower lobe atelectasis or infiltrate. Minimal right basilar opacity as well. No overt edema. No effusions scattered that no acute bony abnormality. IMPRESSION: Small left effusion. Bilateral lower lobe atelectasis or infiltrates, left greater than right. Mild cardiomegaly with vascular congestion. Electronically Signed   By: Rolm Baptise M.D.   On: 04/12/2018 11:26   US Thoracentesis Asp Pleural Space W/img Guide  Result Date: 04/27/2018 INDICATION: Left pleural effusion EXAM: ULTRASOUND GUIDED LEFT THORACENTESIS MEDICATIONS: None. COMPLICATIONS: None immediate. PROCEDURE: An ultrasound guided thoracentesis was thoroughly discussed  with the patient and questions answered. The benefits, risks, alternatives and complications were also discussed. The patient understands and wishes to proceed with the procedure. Written consent was obtained. Ultrasound was performed to localize and mark an adequate pocket of fluid in the left chest. The area was then prepped and draped in the normal sterile fashion. 1% Lidocaine was used for local anesthesia. Under ultrasound guidance a 6 Fr Safe-T-Centesis catheter was  introduced. Thoracentesis was performed. The catheter was removed and a dressing applied. FINDINGS: A total of approximately 1200 cc of clear yellow fluid was removed. Samples were sent to the laboratory as requested by the clinical team. IMPRESSION: Successful ultrasound guided left thoracentesis yielding 1200 cc of pleural fluid. Electronically Signed   By: Marybelle Killings M.D.   On: 04/27/2018 10:04    Assessment & Plan    afib-remains in nsr off of amiodarone thus far. Will remain off and follow. Continue with metoprolol   Pleural effusions-stable post thoracentesis.   Signed, Javier Docker Arantza Darrington MD 04/29/2018, 8:32 AM  Pager: [phone]

## 2018-04-29 NOTE — Consult Note (Addendum)
Hematology/Oncology Consult note [facility] Telephone:([phone] [phone]  Patient Care Team: Katheren Shams as PCP - General   Name of the patient: Danny Cook  [id number]  June 06, 1961   Date of visit: 04/29/18 REASON FOR COSULTATION:  Monoclonal gammopathy, anemia, steroid responsive unknown system illness.  History of presenting illness-  57 y.o. male with PMH including HIV, CHF, chronic kidney disease, schizophrenia is currently admitted due to febrile illness, cough. Patient has had recurrent hospitalization for the same complaint and was recently admitted 8 [facility] between 10 14-10 16 and was treated as pneumonia.  CT chest at that time showed left pleural effusion and atelectasis. Patient also received steroids which made him feel better. Patient was discharged and felt well for couple of days.  After stopping steroids he started to have fever, cough and chills.  Nonproductive cough. He was also recently admitted from 9/24 -/9/28 with AKI, acute pericarditis, acute encephalopathy. Patient tells me that prior to these admissions, he has been feeling well at baseline.  Denies any weight loss, night sweating.  He had good appetite at that time.  Denies any easy bruising, skin rash, neurologic symptoms, urinary symptoms, abdominal pain nausea vomiting.. Patient has underwent extensive  etiology work-up.  Negative influenza, negative blood culture, procalcitonin negative, CMV DNA quantitative pending, EBV pending, Parvovirus B19 pending Quant to [place] TB Gold plus nonconclusive.  Repeat is pending. LDH normal ESR persistently elevated, ANA negative, ANCA negative. Angiotensin converting enzyme negative, antiphospholipid panel negative, Scleroderma (Scl-70) (ENA) Antibody negative.  Protein serum electrophoresis showed a faint band in the beta region which may represent a monoclonal gammopathy.  M spike 0.4.  Pleural fungus culture with stain  pending. 04/14/2018 CT chest without contrast showed small left pleural effusion with associated atelectasis.  New small pericardial effusion compared to prior CT 10/31 2019 showed a stable small pericardial effusion.  Stable mild left pleural effusion with associated atelectasis of left lower lobe.  Stable adrenal adenoma.  4.2 cm ascending thoracic aortic aneurysm. CT head, MRI and brain and MRI of the brain was negative for acute pathology.  EEG negative for seizures.  Patient sitting in recliner comfortably.  Denies any pain.  He spiked fever of 102 last night  Review of Systems  Constitutional: Positive for fever. Negative for chills.  HENT: Negative for sore throat.   Eyes: Negative for redness.  Respiratory: Positive for cough.   Cardiovascular: Negative for chest pain.  Gastrointestinal: Negative for abdominal pain, nausea and vomiting.  Genitourinary: Negative for dysuria.  Musculoskeletal: Negative for joint pain.  Skin: Negative for rash.  Neurological: Negative for focal weakness.  Endo/Heme/Allergies: Does not bruise/bleed easily.  Psychiatric/Behavioral: Negative for hallucinations.    Allergies  Allergen Reactions  . Trazodone And Nefazodone   . Viread [Tenofovir Disoproxil]     Damages kidneys  . Etodolac Rash    Patient Active Problem List   Diagnosis Date Noted  . 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 Healthcare associated bacterial pneumonia 04/25/2018  . Community acquired pneumonia 04/12/2018  . Pneumonia 04/12/2018  . AKI (acute kidney injury) ([place]) 03/23/2018  . Pericarditis with effusion 03/23/2018  . Schizoaffective disorder, depressive type ([place]) 11/04/2017  . HIV positive ([place]) 11/04/2017  . Orthostatic dizziness 07/31/2017  . Chronic diastolic heart failure ([place]) 07/29/2017  . HTN (hypertension) 07/29/2017  . Diabetes ([place]) 07/29/2017  . Schizophrenia ([place]) 07/29/2017     Past Medical History:  Diagnosis Date  . Anemia     .  Anxiety   . Arthritis   . CHF (congestive heart failure) ([place])   . Chronic kidney disease    Renal Insufficiency Syndrome; Glomerulosclerosis 2013  . Complication of anesthesia   . Depressed   . Diabetes mellitus without complication ([place])   . High cholesterol   . HIV (human immunodeficiency virus infection) ([place])   . Hypertension   . 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 Paranoid disorder ([place])   . Schizophrenia, paranoid ([place])   . Sleep apnea      Past Surgical History:  Procedure Laterality Date  . COLONOSCOPY WITH PROPOFOL N/A 09/03/2015   Procedure: COLONOSCOPY WITH PROPOFOL;  Surgeon: Lollie Sails, MD;  Location: [facility] ENDOSCOPY;  Service: Endoscopy;  Laterality: N/A;  . DG TEETH FULL    . ESOPHAGOGASTRODUODENOSCOPY (EGD) WITH PROPOFOL N/A 07/01/2016   Procedure: ESOPHAGOGASTRODUODENOSCOPY (EGD) WITH PROPOFOL;  Surgeon: Lollie Sails, MD;  Location: [facility] ENDOSCOPY;  Service: Endoscopy;  Laterality: N/A;    Social History   Socioeconomic History  . Marital status: Single    Spouse name: Not on file  . Number of children: Not on file  . Years of education: 8  . Highest education level: GED or equivalent  Occupational History  . Not on file  Social Needs  . Financial resource strain: Not hard at all  . Food insecurity:    Worry: Sometimes true    Inability: Never true  . Transportation needs:    Medical: No    Non-medical: No  Tobacco Use  . Smoking status: Never Smoker  . Smokeless tobacco: Never Used  Substance and Sexual Activity  . Alcohol use: No  . Drug use: No  . Sexual activity: Never  Lifestyle  . Physical activity:    Days per week: 0 days    Minutes per session: 0 min  . Stress: To some extent  Relationships  . Social connections:    Talks on phone: More than three times a week    Gets together: Three times a week    Attends religious service: Never    Active member of club or organization: No    Attends meetings of clubs or organizations:  Never    Relationship status: Never married  . Intimate partner violence:    Fear of current or ex partner: Patient refused    Emotionally abused: Patient refused    Physically abused: Patient refused    Forced sexual activity: Patient refused  Other Topics Concern  . Not on file  Social History Narrative  . Not on file     Family History  Problem Relation Age of Onset  . Heart attack Mother   . Diabetes Mellitus II Mother   . Mental illness Mother   . CAD Mother   . Heart attack Father   . CAD Father   . Hypertension Father      Current Facility-Administered Medications:  .  [START ON 04/30/2018] 0.9 %  sodium chloride infusion, , Intravenous, Continuous, Shick, Michael, MD .  abacavir-dolutegravir-lamiVUDine ([place]) 600-50-300 MG per tablet 1 tablet, 1 tablet, Oral, Daily, Tsosie Billing, MD, 1 tablet at 04/29/18 0852 .  acetaminophen (TYLENOL) tablet 650 mg, 650 mg, Oral, Q6H PRN, Tsosie Billing, MD .  aspirin EC tablet 325 mg, 325 mg, Oral, Daily, Jodell Cipro, Prasanna, MD, 325 mg at 04/29/18 0850 .  benzonatate (TESSALON) capsule 100 mg, 100 mg, Oral, Q6H PRN, Arta Silence, MD, 100 mg at 04/26/18 2109 .  bisacodyl (DULCOLAX) EC tablet 5 mg,  5 mg, Oral, Daily PRN, Jodell Cipro, Prasanna, MD .  budesonide (PULMICORT) nebulizer solution 1 mg, 1 mg, Nebulization, BID, Shari Prows, RPH, 1 mg at 04/29/18 8453 .  butalbital-acetaminophen-caffeine (FIORICET, ESGIC) 50-325-40 MG per tablet 1-2 tablet, 1-2 tablet, Oral, Q6H PRN, Arta Silence, MD, 2 tablet at 04/26/18 0327 .  chlorpheniramine-HYDROcodone (TUSSIONEX) 10-8 MG/5ML suspension 5 mL, 5 mL, Oral, Q12H PRN, Arta Silence, MD, 5 mL at 04/27/18 0828 .  clonazePAM (KLONOPIN) tablet 1 mg, 1 mg, Oral, BID PRN, Vaughan Basta, MD, 1 mg at 04/27/18 0845 .  diphenhydrAMINE (BENADRYL) capsule 50 mg, 50 mg, Oral, QHS PRN, Arta Silence, MD, 50 mg at 04/28/18 2112 .  divalproex  (DEPAKOTE ER) 24 hr tablet 500 mg, 500 mg, Oral, BID, Jodell Cipro, Prasanna, MD, 500 mg at 04/29/18 0852 .  enoxaparin (LOVENOX) injection 40 mg, 40 mg, Subcutaneous, Q24H, Jodell Cipro, Prasanna, MD, 40 mg at 04/29/18 0847 .  feeding supplement (NEPRO CARB STEADY) liquid 237 mL, 237 mL, Oral, BID BM, Sridharan, Prasanna, MD .  guaiFENesin-dextromethorphan (ROBITUSSIN DM) 100-10 MG/5ML syrup 5 mL, 5 mL, Oral, Q4H PRN, Vaughan Basta, MD, 5 mL at 04/29/18 0847 .  ibuprofen (ADVIL,MOTRIN) tablet 200 mg, 200 mg, Oral, Once, Vaughan Basta, MD .  iloperidone (FANAPT) tablet 12 mg, 12 mg, Oral, BID, Arta Silence, MD, 12 mg at 04/29/18 0851 .  insulin aspart (novoLOG) injection 0-15 Units, 0-15 Units, Subcutaneous, TID WC, Arta Silence, MD, 5 Units at 04/29/18 1711 .  insulin aspart (novoLOG) injection 0-5 Units, 0-5 Units, Subcutaneous, QHS, Arta Silence, MD, 2 Units at 04/26/18 2110 .  insulin aspart (novoLOG) injection 4 Units, 4 Units, Subcutaneous, TID WC, Vaughan Basta, MD, 4 Units at 04/29/18 1712 .  iopamidol (ISOVUE-300) 61 % injection 15 mL, 15 mL, Oral, Q1 Hr x 2, Sainani, Vivek J, MD .  ipratropium-albuterol (DUONEB) 0.5-2.5 (3) MG/3ML nebulizer solution 3 mL, 3 mL, Nebulization, Q4H PRN, Arta Silence, MD, 3 mL at 04/27/18 2028 .  losartan (COZAAR) tablet 25 mg, 25 mg, Oral, Daily, Kolluru, Sarath, MD, 25 mg at 04/29/18 0850 .  metoprolol tartrate (LOPRESSOR) tablet 25 mg, 25 mg, Oral, BID, Teodoro Spray, MD, 25 mg at 04/29/18 0849 .  mirtazapine (REMERON) tablet 30 mg, 30 mg, Oral, QHS, Vaughan Basta, MD, 30 mg at 04/28/18 2112 .  nitroGLYCERIN (NITROSTAT) SL tablet 0.4 mg, 0.4 mg, Sublingual, Q5 min PRN, Jodell Cipro, Prasanna, MD .  ondansetron (ZOFRAN) injection 4 mg, 4 mg, Intravenous, Q6H PRN, Jodell Cipro, Prasanna, MD .  oxyCODONE (Oxy IR/ROXICODONE) immediate release tablet 5 mg, 5 mg, Oral, Q6H PRN, Vaughan Basta, MD, 5 mg  at 04/27/18 1722 .  pantoprazole (PROTONIX) EC tablet 40 mg, 40 mg, Oral, BID, Vaughan Basta, MD, 40 mg at 04/29/18 0850 .  rosuvastatin (CRESTOR) tablet 40 mg, 40 mg, Oral, Daily, Vaughan Basta, MD, 40 mg at 04/29/18 0848 .  senna-docusate (Senokot-S) tablet 1 tablet, 1 tablet, Oral, QHS PRN, Jodell Cipro, Prasanna, MD .  sucralfate (CARAFATE) tablet 1 g, 1 g, Oral, BID, Vaughan Basta, MD, 1 g at 04/29/18 0850 .  venlafaxine XR (EFFEXOR-XR) 24 hr capsule 75 mg, 75 mg, Oral, Q breakfast, Jodell Cipro, Prasanna, MD, 75 mg at 04/29/18 0850 .  vitamin B-12 (CYANOCOBALAMIN) tablet 1,000 mcg, 1,000 mcg, Oral, Daily, Arta Silence, MD, 1,000 mcg at 04/29/18 0849   Physical exam Vitals:   04/29/18 0847 04/29/18 0900 04/29/18 1038 04/29/18 1613  BP: 114/64   106/67  Pulse: 81   64  Resp: 18  12  Temp:  99.9 F (37.7 C) 98.6 F (37 C) 97.8 F (36.6 C)  TempSrc:    Oral  SpO2: 94%   100%  Weight:      Height:       Physical Exam  Constitutional: No distress.  HENT:  Head: Normocephalic and atraumatic.  Mouth/Throat: No oropharyngeal exudate.  Eyes: Pupils are equal, round, and reactive to light.  Neck: Neck supple.  Cardiovascular: Normal rate.  Pulmonary/Chest: Effort normal. No respiratory distress.  Abdominal: Soft. He exhibits no distension. There is no tenderness.  Musculoskeletal: Normal range of motion.  Neurological: He is alert. No cranial nerve deficit.  Skin: Skin is dry. No rash noted. He is not diaphoretic. No erythema.  Psychiatric: Affect normal.        CMP Latest Ref Rng & Units 04/28/2018  Glucose 70 - 99 mg/dL 117(H)  BUN 6 - 20 mg/dL 15  Creatinine 0.61 - 1.24 mg/dL 1.52(H)  Sodium 135 - 145 mmol/L 141  Potassium 3.5 - 5.1 mmol/L 3.6  Chloride 98 - 111 mmol/L 105  CO2 22 - 32 mmol/L 27  Calcium 8.9 - 10.3 mg/dL 8.3(L)  Total Protein 6.5 - 8.1 g/dL 6.7  Total Bilirubin 0.3 - 1.2 mg/dL 0.4  Alkaline Phos 38 - 126 U/L 72  AST 15  - 41 U/L 53(H)  ALT 0 - 44 U/L 35   CBC Latest Ref Rng & Units 04/28/2018  WBC 4.0 - 10.5 K/uL 9.5  Hemoglobin 13.0 - 17.0 g/dL 8.5(L)  Hematocrit 39.0 - 52.0 % 27.2(L)  Platelets 150 - 400 K/uL 168   RADIOGRAPHIC STUDIES: I have personally reviewed the radiological images as listed and agreed with the findings in the report. Dg Chest 2 View  Result Date: 04/29/2018 CLINICAL DATA:  Dry cough and pleural effusion, follow-up examination EXAM: CHEST - 2 VIEW COMPARISON:  04/27/2018 FINDINGS: Cardiac shadow is enlarged but stable. No pneumothorax is noted. Left basilar atelectasis with associated small effusion is seen stable from the previous exam. IMPRESSION: Stable left effusion and basilar atelectasis. No pneumothorax is seen. Electronically Signed   By: Inez Catalina M.D.   On: 04/29/2018 09:12   Dg Chest 2 View  Result Date: 04/24/2018 CLINICAL DATA:  Dry cough, fever, and chills since yesterday. Patient also states SOB on exertion. Patient states that he was discharged from hospital 10 days ago where he was admitted with pneumonia. Hx of CHF, diabetes, Kaposi's sarcoma, HIV. EXAM: CHEST - 2 VIEW COMPARISON:  04/12/2018 and older exams FINDINGS: There is increased opacity at the left base now obscuring the left heart border as well as the hemidiaphragm. Remainder of the lungs is clear. Small to moderate left pleural effusion. No right pleural effusion. No pneumothorax. Cardiac silhouette is normal in size and configuration. No mediastinal or hilar masses. No evidence of adenopathy. Skeletal structures are unremarkable. IMPRESSION: 1. Increased opacity at the left lung base since the prior exams. This is consistent with a combination of pleural fluid with atelectasis and/or pneumonia. 2. No other abnormality.  No pulmonary edema. Electronically Signed   By: Lajean Manes M.D.   On: 04/24/2018 16:01   Ct Chest Wo Contrast  Result Date: 04/29/2018 CLINICAL DATA:  Pleural effusion, cough. EXAM:  CT CHEST WITHOUT CONTRAST TECHNIQUE: Multidetector CT imaging of the chest was performed following the standard protocol without IV contrast. COMPARISON:  Radiographs of same day.  CT scan of April 14, 2018. FINDINGS: Cardiovascular: 4.2 cm ascending thoracic aortic aneurysm is  noted. Small pericardial effusion is noted. Mild coronary artery calcifications are noted. Mediastinum/Nodes: No enlarged mediastinal or axillary lymph nodes. Thyroid gland, trachea, and esophagus demonstrate no significant findings. Lungs/Pleura: No pneumothorax is noted. Right lung is clear. Mild left pleural effusion is noted with associated atelectasis of the left lower lobe. Upper Abdomen: Stable left adrenal adenoma. No other abnormality seen in visualized portion of upper abdomen. Musculoskeletal: No chest wall mass or suspicious bone lesions identified. IMPRESSION: 4.2 cm ascending thoracic aortic aneurysm. Recommend annual imaging followup by CTA or MRA. This recommendation follows 2010 ACCF/AHA/AATS/ACR/ASA/SCA/SCAI/SIR/STS/SVM Guidelines for the Diagnosis and Management of Patients with Thoracic Aortic Disease. Circulation. 2010; 121: K270-W237. Stable small pericardial effusion. Mild coronary artery calcifications are noted. Stable mild left pleural effusion is noted with associated atelectasis of the left lower lobe. Stable left adrenal adenoma. Electronically Signed   By: Marijo Conception, M.D.   On: 04/29/2018 13:33   Ct Chest Wo Contrast  Result Date: 04/14/2018 CLINICAL DATA:  57 year old male with a history of fever and cough EXAM: CT CHEST WITHOUT CONTRAST TECHNIQUE: Multidetector CT imaging of the chest was performed following the standard protocol without IV contrast. COMPARISON:  Chest x-ray 04/12/2018, CT 07/25/2017 FINDINGS: Cardiovascular: Heart size unchanged, not enlarged. New small pericardial fluid/thickening compared to the CT of 07/25/2017. Calcifications of the left anterior descending coronary artery.  Unremarkable course caliber and contour of the thoracic aorta. Differential attenuation of the blood pool and the myocardium. Mediastinum/Nodes: Multiple lymph nodes of the mediastinum, potentially reactive. Unremarkable appearance of the thoracic esophagus. Unremarkable thoracic inlet. Lungs/Pleura: Small left-sided pleural effusion with associated atelectasis. No confluent airspace disease. No pneumothorax. No evidence of pulmonary edema. Upper Abdomen: No acute finding of the upper abdomen. Musculoskeletal: No acute displaced fracture. Degenerative changes of the spine. No bony canal narrowing. IMPRESSION: Small left pleural effusion with associated atelectasis. New small pericardial effusion compared to the prior CT. CT demonstrates evidence of anemia. Correlation with lab values may be useful. Single vessel coronary artery disease. Electronically Signed   By: Corrie Mckusick D.O.   On: 04/14/2018 14:19   Dg Chest Left Decubitus  Result Date: 04/26/2018 CLINICAL DATA:  57 year old male. Follow-up pleural effusions. Subsequent encounter. EXAM: CHEST - LEFT DECUBITUS COMPARISON:  04/25/2018 chest x-ray. FINDINGS: Left-side down decubitus view of the chest reveals left pleural fluid layers in a dependent position. IMPRESSION: Left pleural effusion layers on decubitus view. Electronically Signed   By: Genia Del M.D.   On: 04/26/2018 15:01   US Venous Img Lower Bilateral  Result Date: 04/28/2018 CLINICAL DATA:  57 year old male with bilateral lower extremity edema EXAM: BILATERAL LOWER EXTREMITY VENOUS DOPPLER ULTRASOUND TECHNIQUE: Gray-scale sonography with graded compression, as well as color Doppler and duplex ultrasound were performed to evaluate the lower extremity deep venous systems from the level of the common femoral vein and including the common femoral, femoral, profunda femoral, popliteal and calf veins including the posterior tibial, peroneal and gastrocnemius veins when visible. The  superficial great saphenous vein was also interrogated. Spectral Doppler was utilized to evaluate flow at rest and with distal augmentation maneuvers in the common femoral, femoral and popliteal veins. COMPARISON:  None. FINDINGS: RIGHT LOWER EXTREMITY Common Femoral Vein: No evidence of thrombus. Normal compressibility, respiratory phasicity and response to augmentation. Increased pulsatility of the venous waveform. Saphenofemoral Junction: No evidence of thrombus. Normal compressibility and flow on color Doppler imaging. Profunda Femoral Vein: No evidence of thrombus. Normal compressibility and flow on color Doppler imaging. Femoral  Vein: No evidence of thrombus. Normal compressibility, respiratory phasicity and response to augmentation. Popliteal Vein: No evidence of thrombus. Normal compressibility, respiratory phasicity and response to augmentation. Calf Veins: No evidence of thrombus. Normal compressibility and flow on color Doppler imaging. Superficial Great Saphenous Vein: No evidence of thrombus. Normal compressibility. Venous Reflux:  None. Other Findings:  None. LEFT LOWER EXTREMITY Common Femoral Vein: No evidence of thrombus. Normal compressibility, respiratory phasicity and response to augmentation. Increased pulsatility of the venous waveform. Saphenofemoral Junction: No evidence of thrombus. Normal compressibility and flow on color Doppler imaging. Profunda Femoral Vein: No evidence of thrombus. Normal compressibility and flow on color Doppler imaging. Femoral Vein: No evidence of thrombus. Normal compressibility, respiratory phasicity and response to augmentation. Popliteal Vein: No evidence of thrombus. Normal compressibility, respiratory phasicity and response to augmentation. Calf Veins: No evidence of thrombus. Normal compressibility and flow on color Doppler imaging. Superficial Great Saphenous Vein: No evidence of thrombus. Normal compressibility. Venous Reflux:  None. Other Findings:  None.  IMPRESSION: 1. No evidence of deep venous thrombosis in either lower extremity. 2. Increased pulsatility of the venous waveforms bilaterally suggesting elevated right heart pressures. Differential considerations include tricuspid regurgitation, right heart failure, COPD and pulmonary arterial hypertension. Electronically Signed   By: Jacqulynn Cadet M.D.   On: 04/28/2018 16:52   Dg Chest Port 1 View  Result Date: 04/27/2018 CLINICAL DATA:  Status post thoracentesis. EXAM: PORTABLE CHEST 1 VIEW COMPARISON:  Ultrasound 04/27/2018.  Chest x-ray 04/26/2018. FINDINGS: Interval left-sided thoracentesis. Decrease in left-sided pleural effusion. Left base atelectasis. No pneumothorax. Stable cardiomegaly. IMPRESSION: 1. Interval left thoracentesis with decrease in left-sided pleural effusion. Left base atelectasis. No pneumothorax. 2.  Stable cardiomegaly. Electronically Signed   By: Marcello Moores  Register   On: 04/27/2018 10:42   Dg Chest Port 1 View  Result Date: 04/25/2018 CLINICAL DATA:  57 y/o  M; cough and shortness of breath. EXAM: PORTABLE CHEST 1 VIEW COMPARISON:  04/24/2018 chest radiograph. FINDINGS: Normal cardiac silhouette. Stable small to moderate left pleural effusion and basilar opacity. No new consolidation. No pneumothorax. No acute osseous abnormality is evident. IMPRESSION: Stable small to moderate left pleural effusion and basilar opacity which may represent associated atelectasis or pneumonia. Electronically Signed   By: Kristine Garbe M.D.   On: 04/25/2018 03:59   Dg Chest Port 1 View  Result Date: 04/12/2018 CLINICAL DATA:  Nonproductive cough, fever, chills EXAM: PORTABLE CHEST 1 VIEW COMPARISON:  03/29/2018 FINDINGS: Cardiomegaly with vascular congestion. Small left pleural effusion with left lower lobe atelectasis or infiltrate. Minimal right basilar opacity as well. No overt edema. No effusions scattered that no acute bony abnormality. IMPRESSION: Small left effusion.  Bilateral lower lobe atelectasis or infiltrates, left greater than right. Mild cardiomegaly with vascular congestion. Electronically Signed   By: Rolm Baptise M.D.   On: 04/12/2018 11:26   US Thoracentesis Asp Pleural Space W/img Guide  Result Date: 04/27/2018 INDICATION: Left pleural effusion EXAM: ULTRASOUND GUIDED LEFT THORACENTESIS MEDICATIONS: None. COMPLICATIONS: None immediate. PROCEDURE: An ultrasound guided thoracentesis was thoroughly discussed with the patient and questions answered. The benefits, risks, alternatives and complications were also discussed. The patient understands and wishes to proceed with the procedure. Written consent was obtained. Ultrasound was performed to localize and mark an adequate pocket of fluid in the left chest. The area was then prepped and draped in the normal sterile fashion. 1% Lidocaine was used for local anesthesia. Under ultrasound guidance a 6 Fr Safe-T-Centesis catheter was introduced. Thoracentesis was performed. The  catheter was removed and a dressing applied. FINDINGS: A total of approximately 1200 cc of clear yellow fluid was removed. Samples were sent to the laboratory as requested by the clinical team. IMPRESSION: Successful ultrasound guided left thoracentesis yielding 1200 cc of pleural fluid. Electronically Signed   By: Marybelle Killings M.D.   On: 04/27/2018 10:04    Assessment and plan- Patient is a 57 y.o. male with history of HIV, CHF, chronic kidney disease(FSGN), schizophreniais admitted with cough, fever.  # Fever of unknown origin/steroid responsive illness,  Autoimmune disease vs infectious etiology[ viral?]  vs malignancy Per note, his case was discussed on case conference, ?amyloidosis MGUS: Patient's serum protein phoresis showed very faint monoclonal band, serum IFE normal, urine IFE normal, normal immunoglobulin   I ordered light chain ratio    Normal ferritin, no splenomegaly, less likely [place],  Flowcytometry pending, I think it is  reasonable to obtain bone marrow biopsy as part of patient's fever of unknown origin. I discussed with patient and he is in agreement.  In addition to the routine labs, will also add bone marrow AFB cultures, congo red staining for amyloidosis, etc.   Thank you for allowing me to participate in the care of this patient.  Total face to face encounter time for this patient visit was 34mn. >50% of the time was  spent in counseling and coordination of care.    ZEarlie Server MD, PhD Hematology Oncology CRevision Advanced Surgery Center Incat APain Diagnostic Treatment CenterPager- [phone]/31/2019

## 2018-04-29 NOTE — Plan of Care (Signed)
  Problem: Education: Goal: Knowledge of General Education information will improve Description Including pain rating scale, medication(s)/side effects and non-pharmacologic comfort measures Outcome: Progressing No complains of pain throughout shift.   Problem: Clinical Measurements: Goal: Ability to maintain clinical measurements within normal limits will improve Outcome: Progressing   Problem: Activity: Goal: Risk for activity intolerance will decrease Outcome: Progressing Pt ambulated around nurses stationx 2 and tolerated well.

## 2018-04-29 NOTE — Progress Notes (Signed)
Danny Cook. is a 57 y.o. malewith history of HIV, CHF, chronic kidney disease(FSGN), schizophrenia  is admitted with cough, fever . Pt has had recurrent hospitalization for the same complaint. Was recently in [facility] between 10/14-10/16 and treated as Pneumonia with IV vanco+ cefepime and then sent home on cefdinir eventhough CT chest did not show any pneumonia (left pleural effusion and atelectasis) and N procal) He also received steroids which made him feel much better- He was sent home on 3 days of steroids and since stopping it he started to get fever, cough and chills, No sputum, no hemoptysis, no chest pain,  Has headaches. He currently lives in [facility] assisted living  Prior to this was hospitalized between 9/21-9/2 6 for dizziness, nausea ,headaches and cough and found to be hypotensive, AKI on CKD and was in ICU and treated with fluids and pressors. 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 As there was no tamponade no pericardiocentesis was done. He also had high ESR and was seen by rheumatologist and work up including ANA, RH factor, ANCA were negative. HE was treated with IV antibiotics for a few days and also was given antibiotics along with steroids  for possible meningitis   for a day  as he had become drowsy in the ICU. All the antibiotics were stopped as there was no evidence clinically of meningitis or encephalitis. CT scan of the head, MRI of the brain and MRA of the brain was negative for acute pathology. Carotid ultrasound negative. EEG negative for seizures. Mental status improved. The blood cultures were also negative. AKI was thought to be due to volume depletion due to JArdiance and lasix which were stopped by his PCP a day prior to admission  Pt was also very anxious from his underlying mental health problem.  After discharge on 9/28 he returned to the ED on 9/30  complaining of feeling anxious and sob. He did not want to live alone as it was making him anxious and so he was sent to an assisted living.    Subjective Says he is feeling better No cough No fever since morning  Objective:  VITALS:  Patient Vitals for the past 24 hrs:  BP Temp Temp src Pulse Resp SpO2  04/29/18 1613 106/67 97.8 F (36.6 C) Oral 64 12 100 %  04/29/18 1038 - 98.6 F (37 C) - - - -  04/29/18 0900 - 99.9 F (37.7 C) - - - -  04/29/18 0847 114/64 - - 81 18 94 %  04/29/18 0846 114/64 - - 74 - -  04/29/18 0404 (!) 106/58 98.8 F (37.1 C) Oral 74 18 97 %  04/28/18 2301 - 98.4 F (36.9 C) Oral - - -  04/28/18 2037 - - - - - 92 %  04/28/18 2010 122/64 (!) 102.7 F (39.3 C) Oral 92 - 93 %  04/28/18 1653 99/61 98.4 F (36.9 C) Oral 77 18 97 %  PHYSICAL EXAM:  General: Alert, cooperative, no distress, appears stated age.  Head: Normocephalic, without obvious abnormality, atraumatic. Eyes: Conjunctivae clear, anicteric sclerae. Pupils are equal ENT Nares normal. No drainage or sinus tenderness. Lips, mucosa, and tongue normal. No Thrush Neck: Supple, symmetrical, no adenopathy, thyroid: non tender no carotid bruit and no JVD. Back: No CVA tenderness. Lungs: b/l air entry, decreased left base Heart: Regular rate and rhythm, no murmur, rub or gallop. Abdomen:  Soft, non-tender,not distended. Bowel sounds normal. No masses Extremities: atraumatic, no cyanosis. No edema. No clubbing Skin: No rashes or lesions. Or bruising Lymph: Cervical, supraclavicular normal. Neurologic: Grossly non-focal Pertinent Labs Lab Results CBC    Component Value Date/Time   WBC 9.5 04/28/2018 0634   RBC 2.91 (L) 04/28/2018 0634   HGB 8.5 (L) 04/28/2018 0634   HGB 9.5 (L) 03/23/2018 2341   HCT 27.2 (L) 04/28/2018 0634   HCT 29.5 (L) 03/23/2018 2341   PLT 168 04/28/2018 0634   PLT 156 03/23/2018 2341   MCV 93.5 04/28/2018 0634   MCV 95 03/23/2018 2341   MCV 93 10/23/2014 1719    MCH 29.2 04/28/2018 0634   MCHC 31.3 04/28/2018 0634   RDW 15.7 (H) 04/28/2018 0634   RDW 14.9 03/23/2018 2341   RDW 13.0 10/23/2014 1719   LYMPHSABS 1.1 04/25/2018 0316   LYMPHSABS 2.9 03/23/2018 2341   LYMPHSABS 2.3 12/27/2012 0550   MONOABS 1.1 (H) 04/25/2018 0316   MONOABS 0.7 12/27/2012 0550   EOSABS 0.0 04/25/2018 0316   EOSABS 0.1 03/23/2018 2341   EOSABS 0.3 12/27/2012 0550   BASOSABS 0.0 04/25/2018 0316   BASOSABS 0.0 03/23/2018 2341   BASOSABS 0.1 12/27/2012 0550    CMP Latest Ref Rng & Units 04/28/2018 04/27/2018 04/26/2018  Glucose 70 - 99 mg/dL 117(H) 238(H) 246(H)  BUN 6 - 20 mg/dL 15 17 16   Creatinine 0.61 - 1.24 mg/dL 1.52(H) 1.54(H) 1.59(H)  Sodium 135 - 145 mmol/L 141 140 137  Potassium 3.5 - 5.1 mmol/L 3.6 3.5 3.8  Chloride 98 - 111 mmol/L 105 105 108  CO2 22 - 32 mmol/L 27 26 22   Calcium 8.9 - 10.3 mg/dL 8.3(L) 7.9(L) 8.0(L)  Total Protein 6.5 - 8.1 g/dL 6.7 - -  Total Bilirubin 0.3 - 1.2 mg/dL 0.4 - -  Alkaline Phos 38 - 126 U/L 72 - -  AST 15 - 41 U/L 53(H) - -  ALT 0 - 44 U/L 35 - -      Microbiology: Recent Results (from the past 240 hour(s))  Culture, blood (routine x 2)     Status: None   Collection Time: 04/24/18  7:06 PM  Result Value Ref Range Status   Specimen Description BLOOD LEFT HAND  Final   Special Requests   Final    BOTTLES DRAWN AEROBIC AND ANAEROBIC Blood Culture results Danny not be optimal due to an excessive volume of blood received in culture bottles   Culture   Final    NO GROWTH 5 DAYS Performed at [facility], [address] [place], [place] [address]    Report Status 04/29/2018 FINAL  Final  Culture, blood (routine x 2)     Status: None   Collection Time: 04/24/18  7:40 PM  Result Value Ref Range Status   Specimen Description BLOOD LEFT ANTECUBITAL  Final   Special Requests   Final    BOTTLES DRAWN AEROBIC AND ANAEROBIC Blood Culture results Danny not be optimal due to an excessive volume of blood received  in culture bottles   Culture   Final    NO GROWTH 5 DAYS Performed at [facility], [address], [place], [place] [address]    Report Status 04/29/2018 FINAL  Final  Blood Culture (routine x 2)     Status: None (Preliminary result)   Collection Time: 04/25/18  3:20 AM  Result Value Ref Range Status   Specimen Description BLOOD RIGHT ANTECUBITAL  Final  Special Requests   Final    BOTTLES DRAWN AEROBIC AND ANAEROBIC Blood Culture results Danny not be optimal due to an excessive volume of blood received in culture bottles   Culture   Final    NO GROWTH 4 DAYS Performed at [facility], [address] [place], [place] [address]    Report Status PENDING  Incomplete  Blood Culture (routine x 2)     Status: None (Preliminary result)   Collection Time: 04/25/18  3:54 AM  Result Value Ref Range Status   Specimen Description BLOOD BLOOD LEFT HAND  Final   Special Requests   Final    BOTTLES DRAWN AEROBIC AND ANAEROBIC Blood Culture results Danny not be optimal due to an excessive volume of blood received in culture bottles   Culture   Final    NO GROWTH 4 DAYS Performed at [facility], [address] [place], [place] [address]    Report Status PENDING  Incomplete  Urine culture     Status: None   Collection Time: 04/25/18  6:57 AM  Result Value Ref Range Status   Specimen Description   Final    URINE, RANDOM Performed at [facility], [address], [place], [place] [address]    Special Requests   Final    NONE Performed at [facility], [address], [place], [place] [address]    Culture   Final    NO GROWTH Performed at [facility], [place] [address] [place], [place] [address]    Report Status 04/26/2018 FINAL  Final  MRSA PCR Screening     Status: None   Collection Time: 04/26/18  1:08 AM  Result Value Ref Range Status   MRSA by PCR NEGATIVE NEGATIVE Final    Comment:        The GeneXpert MRSA Assay (FDA approved  for NASAL specimens only), is one component of a comprehensive MRSA colonization surveillance program. It is not intended to diagnose MRSA infection nor to guide or monitor treatment for MRSA infections. Performed at [facility], [address] [place], [place] [address]   Body fluid culture     Status: None (Preliminary result)   Collection Time: 04/27/18  9:50 AM  Result Value Ref Range Status   Specimen Description   Final    PLEURAL Performed at [facility], [address], [place], [address] [address]    Special Requests   Final    NONE Performed at [facility], [address] [place], [place] [address]    Gram Stain   Final    RARE WBC PRESENT, PREDOMINANTLY PMN NO ORGANISMS SEEN    Culture   Final    NO GROWTH 2 DAYS Performed at [facility], [place] [address] [place], [place] [address]    Report Status PENDING  Incomplete  Fungus Culture With Stain     Status: None (Preliminary result)   Collection Time: 04/27/18  9:50 AM  Result Value Ref Range Status   Fungus Stain Final report  Final    Comment: (NOTE) Performed At: [facility] [address] [place], [place] [id number] Rush Farmer MD [id number]    Fungus (Mycology) Culture PENDING  Incomplete   Fungal Source PLEURAL  Final    Comment: Performed at [facility], [address] [place], [place] [address]  Fungus Culture Result     Status: None   Collection Time: 04/27/18  9:50 AM  Result Value Ref Range Status   Result 1 Comment  Final    Comment: (NOTE) KOH/Calcofluor preparation:  no fungus observed. Performed At: [facility] [address] [place], [place] [id number] Rush Farmer MD [id number]    IMAGING RESULTS: ? I  Impression/Recommendation  57 y.o. malewith history of HIV, CHF, chronic kidney disease(FSGN), schizophrenia  is admitted with cough, fever . Pt has had recurrent hospitalization for the same  complaint.  ?Recurrent visits to ED and hospitalization with constellation of symptoms like cough, Fever SOB, headache, anxiety Not clear what the underlying pathology is?  He has following positive  findings Fever Anemia since Sept 2019 High ESR > 100 Pericardial effusion  pleural effusion Serum protein electrophoresis shows low M spike 0.4 indeterminate quantiferon gold  Neg ANA, ANCA, normal ferritin, ACE , LDH, CK, legionella neg, blood cultures neg D.D ? autoimmuneVS malignancy VS infection VS drug induced ?Sarcoid ?TRAPS ? FMF ( no genetic predisposition) Need to r/o body cavity lymphoma  sarcoid TB- afb pleural fluid culture  ( pleural fluid analysis  is not suggestive of pleural TB ( not an exudate, no lymphocytosis)-  Appreciate pulmonologist recommendation- adenosine de-aminase added-  Will get parvovirus, CMV, EBV, toxo, RPR, Borrelia Burgdoferi Will check with lab for other tests like HHV8,  CT abdomen ( IV contrast would be better for abscess but he has CKD) Left pleural effusion with atelectasis- He does not have pneumonia ( was treated as one in the last admission and he responded to steroids rather than to antibiotics last admission)  Pleurocentesis- sent for bacterial, afb ( added) , fungal culture and cytology, cell count and chemistry Need pleural/pericardial biopsy  HHV8  Fluctuating renal function  Adrenal adenoma    Ascending thoracic Aortic aneurysm 4.3 cm?   Afib on amiodarone-  Has been stopped today _ HIV- well controlled on Triumeq- VL < 20 and cd4 >1000 Pt has been on Triumeq since 2016 and before that Epzicom- so I doubt that abacavir is causing fever __________________________________________________ Discussed with patient and care team

## 2018-04-29 NOTE — Progress Notes (Addendum)
Per central tele pt's HR drop to 37 for 5 seconds and went into A-flutter for about 30 sec but its back in NRS, per pt is feeling anxious, and lightheaded. Dr. Ubaldo Glassing notified. Awaiting call back.  Update:1600. Per Dr. Ubaldo Glassing continue with current plan and monitor HR.

## 2018-04-29 NOTE — Progress Notes (Signed)
[facility] NAME: Danny Cook    MR#:  [id number]  DATE OF BIRTH:  03-Jun-1961  SUBJECTIVE:   Patient denies any complaints, sitting up and eating breakfast.  Had a fever of 102.7 yesterday evening again.  Seen by hematology oncology and the plan for IR guided bone marrow biopsy tomorrow.  REVIEW OF SYSTEMS:    Review of Systems  Constitutional: Positive for fever. Negative for chills.  HENT: Negative for congestion and tinnitus.   Eyes: Negative for blurred vision and double vision.  Respiratory: Negative for cough, shortness of breath and wheezing.   Cardiovascular: Negative for chest pain, orthopnea and PND.  Gastrointestinal: Negative for abdominal pain, diarrhea, nausea and vomiting.  Genitourinary: Negative for dysuria and hematuria.  Neurological: Negative for dizziness, sensory change and focal weakness.  All other systems reviewed and are negative.   Nutrition: Heart healthy/Carb modified Tolerating Diet: yes Tolerating PT: Ambulatory  DRUG ALLERGIES:   Allergies  Allergen Reactions  . Trazodone And Nefazodone   . Viread [Tenofovir Disoproxil]     Damages kidneys  . Etodolac Rash    VITALS:  Blood pressure 114/64, pulse 81, temperature 98.6 F (37 C), resp. rate 18, height 5' 7"  (1.702 m), weight 93 kg, SpO2 94 %.  PHYSICAL EXAMINATION:   Physical Exam  GENERAL:  57 y.o.-year-old obese patient sitting up in chair eating breakfast in no acute distress.  EYES: Pupils equal, round, reactive to light and accommodation. No scleral icterus. Extraocular muscles intact.  HEENT: Head atraumatic, normocephalic. Oropharynx and nasopharynx clear.  NECK:  Supple, no jugular venous distention. No thyroid enlargement, no tenderness.  LUNGS: Normal breath sounds bilaterally, no wheezing, rales, rhonchi. No use of accessory muscles of respiration.  CARDIOVASCULAR: S1, S2 normal. No murmurs, rubs, or gallops.    ABDOMEN: Soft, nontender, nondistended. Bowel sounds present. No organomegaly or mass.  EXTREMITIES: No cyanosis, clubbing or edema b/l.    NEUROLOGIC: Cranial nerves II through XII are intact. No focal Motor or sensory deficits b/l. Globally weak.  PSYCHIATRIC: The patient is alert and oriented x 3.  SKIN: No obvious rash, lesion, or ulcer.    LABORATORY PANEL:   CBC Recent Labs  Lab 04/28/18 0634  WBC 9.5  HGB 8.5*  HCT 27.2*  PLT 168   ------------------------------------------------------------------------------------------------------------------  Chemistries  Recent Labs  Lab 04/26/18 0423  04/28/18 0634 04/28/18 1718  NA  --    < > 141  --   K  --    < > 3.6  --   CL  --    < > 105  --   CO2  --    < > 27  --   GLUCOSE  --    < > 117*  --   BUN  --    < > 15  --   CREATININE  --    < > 1.52*  --   CALCIUM  --    < > 8.3*  --   MG 2.1  --   --   --   AST  --   --   --  53*  ALT  --   --   --  35  ALKPHOS  --   --   --  72  BILITOT  --   --   --  0.4   < > = values in this interval not displayed.   ------------------------------------------------------------------------------------------------------------------  Cardiac Enzymes  Recent Labs  Lab 04/26/18 0423  TROPONINI 0.03*   ------------------------------------------------------------------------------------------------------------------  RADIOLOGY:  Dg Chest 2 View  Result Date: 04/29/2018 CLINICAL DATA:  Dry cough and pleural effusion, follow-up examination EXAM: CHEST - 2 VIEW COMPARISON:  04/27/2018 FINDINGS: Cardiac shadow is enlarged but stable. No pneumothorax is noted. Left basilar atelectasis with associated small effusion is seen stable from the previous exam. IMPRESSION: Stable left effusion and basilar atelectasis. No pneumothorax is seen. Electronically Signed   By: Inez Catalina M.D.   On: 04/29/2018 09:12   Ct Chest Wo Contrast  Result Date: 04/29/2018 CLINICAL DATA:  Pleural  effusion, cough. EXAM: CT CHEST WITHOUT CONTRAST TECHNIQUE: Multidetector CT imaging of the chest was performed following the standard protocol without IV contrast. COMPARISON:  Radiographs of same day.  CT scan of April 14, 2018. FINDINGS: Cardiovascular: 4.2 cm ascending thoracic aortic aneurysm is noted. Small pericardial effusion is noted. Mild coronary artery calcifications are noted. Mediastinum/Nodes: No enlarged mediastinal or axillary lymph nodes. Thyroid gland, trachea, and esophagus demonstrate no significant findings. Lungs/Pleura: No pneumothorax is noted. Right lung is clear. Mild left pleural effusion is noted with associated atelectasis of the left lower lobe. Upper Abdomen: Stable left adrenal adenoma. No other abnormality seen in visualized portion of upper abdomen. Musculoskeletal: No chest wall mass or suspicious bone lesions identified. IMPRESSION: 4.2 cm ascending thoracic aortic aneurysm. Recommend annual imaging followup by CTA or MRA. This recommendation follows 2010 ACCF/AHA/AATS/ACR/ASA/SCA/SCAI/SIR/STS/SVM Guidelines for the Diagnosis and Management of Patients with Thoracic Aortic Disease. Circulation. 2010; 121: P619-J093. Stable small pericardial effusion. Mild coronary artery calcifications are noted. Stable mild left pleural effusion is noted with associated atelectasis of the left lower lobe. Stable left adrenal adenoma. Electronically Signed   By: Marijo Conception, M.D.   On: 04/29/2018 13:33   US Venous Img Lower Bilateral  Result Date: 04/28/2018 CLINICAL DATA:  57 year old male with bilateral lower extremity edema EXAM: BILATERAL LOWER EXTREMITY VENOUS DOPPLER ULTRASOUND TECHNIQUE: Gray-scale sonography with graded compression, as well as color Doppler and duplex ultrasound were performed to evaluate the lower extremity deep venous systems from the level of the common femoral vein and including the common femoral, femoral, profunda femoral, popliteal and calf veins  including the posterior tibial, peroneal and gastrocnemius veins when visible. The superficial great saphenous vein was also interrogated. Spectral Doppler was utilized to evaluate flow at rest and with distal augmentation maneuvers in the common femoral, femoral and popliteal veins. COMPARISON:  None. FINDINGS: RIGHT LOWER EXTREMITY Common Femoral Vein: No evidence of thrombus. Normal compressibility, respiratory phasicity and response to augmentation. Increased pulsatility of the venous waveform. Saphenofemoral Junction: No evidence of thrombus. Normal compressibility and flow on color Doppler imaging. Profunda Femoral Vein: No evidence of thrombus. Normal compressibility and flow on color Doppler imaging. Femoral Vein: No evidence of thrombus. Normal compressibility, respiratory phasicity and response to augmentation. Popliteal Vein: No evidence of thrombus. Normal compressibility, respiratory phasicity and response to augmentation. Calf Veins: No evidence of thrombus. Normal compressibility and flow on color Doppler imaging. Superficial Great Saphenous Vein: No evidence of thrombus. Normal compressibility. Venous Reflux:  None. Other Findings:  None. LEFT LOWER EXTREMITY Common Femoral Vein: No evidence of thrombus. Normal compressibility, respiratory phasicity and response to augmentation. Increased pulsatility of the venous waveform. Saphenofemoral Junction: No evidence of thrombus. Normal compressibility and flow on color Doppler imaging. Profunda Femoral Vein: No evidence of thrombus. Normal compressibility and flow on color Doppler imaging. Femoral Vein: No evidence of thrombus. Normal compressibility,  respiratory phasicity and response to augmentation. Popliteal Vein: No evidence of thrombus. Normal compressibility, respiratory phasicity and response to augmentation. Calf Veins: No evidence of thrombus. Normal compressibility and flow on color Doppler imaging. Superficial Great Saphenous Vein: No evidence  of thrombus. Normal compressibility. Venous Reflux:  None. Other Findings:  None. IMPRESSION: 1. No evidence of deep venous thrombosis in either lower extremity. 2. Increased pulsatility of the venous waveforms bilaterally suggesting elevated right heart pressures. Differential considerations include tricuspid regurgitation, right heart failure, COPD and pulmonary arterial hypertension. Electronically Signed   By: Jacqulynn Cadet M.D.   On: 04/28/2018 16:52     ASSESSMENT AND PLAN:   57 year old male with past medical history of schizophrenia, HIV, obstructive sleep apnea, diabetes, hypertension, CKD stage IV, chronic anemia, history of Kaposi's sarcoma who presents to the hospital due to suspected sepsis.  1.  Sepsis-this was the working diagnosis on admission given patient's fever, tachycardia, elevated white cell count and suspected to have pneumonia.  Patient has been hospitalized multiple times for similar reasons and improved with IV steroids and IV antibiotics. - Seen by infectious disease and they do not think that the patient has underlying sepsis or pneumonia but likely some kind of an autoimmune process which is steroid responsive.  Sepsis has now been ruled out.  Patient's cultures remain negative and patient has been tapered off antibiotics. - pt. Continues to have fever spikes though.   2.  Pneumonia-this was suspected on admission and patient has had repeated admissions for similar complaints and similar reasons and has responded well to antibiotics. -Pulmonary was consulted and they do not think that the patient has underlying pneumonia.  Patient was noted to have a left-sided pleural effusion and is status post thoracentesis but the pleural fluid analysis does not seem infectious in nature. - Pulmonary is possibly considering primary tuberculosis and patient had repeated QuantiFERON testing results of which is still pending.  Patient's pleural fluid cytometry is also still pending.   CT scan of the chest obtained today which shows small pleural effusion but no evidence of left lower lobe infiltrate.  Pulmonary to possibly discuss with cardiothoracic surgery for pleural biopsy if the bone marrow biopsy and further serologic testing are not diagnostic.  3.  Steroid responsive systemic illness- etiology unclear presently. - Seen by pulmonary, rheumatology, ID, Nephrology and also Hem/Onc and appreciate input.  Patient's case was discussed with case conference today with all specialists yesterday.  Rheumatology is possibly considering amyloidosis as patient had a mild monoclonal gammopathy with some mild anemia.  - Seen by Hem/onc but their suspicion for Amyloid is low.  Bone Marrow biopsy has been scheduled for tomorrow for now. Serologic testing as per Onc has been done.   4.  Diabetes type 2 without complication-continue sliding scale insulin - BS stable.   5.  CKD stage IV-patient's creatinine is at baseline.  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 -Cr at baseline and will monitor.   6.  History of migraines-continue Fioricet.  7.  Anxiety/depression -continue Klonopin, Effexor.  8.  History of bipolar disorder-continue Depakote.  9. GERD - cont. Protonix.    10. Hx of A. Fib -rate controlled, patient was on amiodarone but it was discontinued given possible underlying autoimmune process and leading to pleural effusion. -Continue metoprolol, appreciate cardiology input. Cont. ASA    All the records are reviewed and case discussed with Care Management/Social Worker. Management plans discussed with the patient,  family and they are in agreement.  CODE STATUS: Full code  DVT Prophylaxis: Lovenox  TOTAL TIME TAKING CARE OF THIS PATIENT: 35 minutes.   POSSIBLE D/C IN 2-3 DAYS, DEPENDING ON CLINICAL CONDITION.   Henreitta Leber M.D on 04/29/2018 at 4:04 PM  Between 7am to 6pm - Pager - [phone]  After 6pm go to  [email or web address] - [facility]  Office  [phone]  CC: Primary care physician; Katheren Shams

## 2018-04-29 NOTE — Progress Notes (Signed)
Clinically no change Still with intermittent fever.  102.7 last night. No new complaints Exam unchanged and continues to reveal bibasilar crackles more prominent on the left than the right  CXR: Again reveals opacity in the left base which I believe is some mixture of infiltrate and effusion  I ordered a CT of chest to better evaluate the left base.  Official report is pending.  There does not appear to be significant infiltrate in the left lower lobe.  The chest x-ray findings represent reaccumulation of the left pleural effusion  Impression: FUO Recurrent L pleural effusion  Cytology pending  Plan/rec: Follow-up on pleural fluid cytology, flow cytometry, leukemia/lymphoma panel, Quant gold If none of the above studies reveals an explanatory diagnosis, I would recommend thoracic surgery consultation to consider VATS with pleural biopsy.  At this point, I will follow peripherally awaiting results of the above tests.  Merton Border, MD PCCM service Mobile [phone] Pager [phone] 04/29/2018 1:37 PM

## 2018-04-29 NOTE — Progress Notes (Signed)
[facility]  ROUNDING NOTE   Subjective:   Tmax 102.7  Wants to go home. Has no specific complaints.   Objective:  Vital signs in last 24 hours:  Temp:  [98.2 F (36.8 C)-102.7 F (39.3 C)] 99.9 F (37.7 C) (10/31 0900) Pulse Rate:  [74-92] 81 (10/31 0847) Resp:  [18] 18 (10/31 0847) BP: (99-122)/(58-64) 114/64 (10/31 0847) SpO2:  [92 %-97 %] 94 % (10/31 0847)  Weight change:  Filed Weights   04/26/18 0003  Weight: 93 kg    Intake/Output: I/O last 3 completed shifts: In: 0  Out: 100 [Urine:100]   Intake/Output this shift:  Total I/O In: -  Out: 400 [Urine:400]  Physical Exam: General: NAD,   Head: Normocephalic, atraumatic. Moist oral mucosal membranes  Eyes: Anicteric, PERRL  Neck: Supple, trachea midline  Lungs:  Clear to auscultation  Heart: Regular rate and rhythm  Abdomen:  Soft, nontender,   Extremities:  no peripheral edema.  Neurologic: Nonfocal, moving all four extremities  Skin: No lesions        Basic Metabolic Panel: Recent Labs  Lab 04/24/18 1444 04/25/18 0316 04/25/18 0548 04/26/18 0423 04/26/18 0427 04/26/18 1811 04/27/18 0359 04/28/18 0634  NA 140 138  --   --   --  137 140 141  K 3.6 3.7  --   --   --  3.8 3.5 3.6  CL 103 102  --   --   --  108 105 105  CO2 23 25  --   --   --  22 26 27   GLUCOSE 157* 214*  --   --   --  246* 238* 117*  BUN 14 17  --   --   --  16 17 15   CREATININE 1.30* 1.41*  --   --   --  1.59* 1.54* 1.52*  CALCIUM 8.9 8.9  --   --   --  8.0* 7.9* 8.3*  MG  --   --  0.8* 2.1  --   --   --   --   PHOS  --   --  1.8*  --  2.3*  --   --   --     Liver Function Tests: Recent Labs  Lab 04/24/18 1444 04/25/18 0316 04/28/18 1718  AST 12* 13* 53*  ALT 16 16 35  ALKPHOS 92 85 72  BILITOT 1.0 0.8 0.4  PROT 7.3 7.1 6.7  ALBUMIN 3.3* 3.3* 2.4*   No results for input(s): LIPASE, AMYLASE in the last 168 hours. No results for input(s): AMMONIA in the last 168 hours.  CBC: Recent Labs  Lab  04/24/18 1444 04/25/18 0316 04/26/18 1811 04/27/18 0359 04/28/18 0634  WBC 12.8* 12.6* 14.1* 15.1* 9.5  NEUTROABS 9.3* 10.2*  --   --   --   HGB 11.7* 11.4* 9.3* 8.7* 8.5*  HCT 37.2* 35.4* 29.4* 27.9* 27.2*  MCV 93.2 92.2 93.3 94.3 93.5  PLT 237 243 167 151 168    Cardiac Enzymes: Recent Labs  Lab 04/25/18 0316 04/25/18 0805 04/26/18 0423  TROPONINI 0.05* 0.03* 0.03*    BNP: Invalid input(s): POCBNP  CBG: Recent Labs  Lab 04/28/18 0829 04/28/18 1142 04/28/18 1650 04/28/18 2109 04/29/18 0748  GLUCAP 124* 174* 107* 182* 31*    Microbiology: Results for orders placed or performed during the hospital encounter of 04/25/18  Blood Culture (routine x 2)     Status: None (Preliminary result)   Collection Time: 04/25/18  3:20 AM  Result Value Ref Range Status   Specimen Description BLOOD RIGHT ANTECUBITAL  Final   Special Requests   Final    BOTTLES DRAWN AEROBIC AND ANAEROBIC Blood Culture results may not be optimal due to an excessive volume of blood received in culture bottles   Culture   Final    NO GROWTH 4 DAYS Performed at [facility], [address] [place], [place] [address]    Report Status PENDING  Incomplete  Blood Culture (routine x 2)     Status: None (Preliminary result)   Collection Time: 04/25/18  3:54 AM  Result Value Ref Range Status   Specimen Description BLOOD BLOOD LEFT HAND  Final   Special Requests   Final    BOTTLES DRAWN AEROBIC AND ANAEROBIC Blood Culture results may not be optimal due to an excessive volume of blood received in culture bottles   Culture   Final    NO GROWTH 4 DAYS Performed at [facility], [address], [place], [place] [address]    Report Status PENDING  Incomplete  Urine culture     Status: None   Collection Time: 04/25/18  6:57 AM  Result Value Ref Range Status   Specimen Description   Final    URINE, RANDOM Performed at [facility], [address], [place], [place]  [address]    Special Requests   Final    NONE Performed at [facility], [address], [place], [place] [address]    Culture   Final    NO GROWTH Performed at [facility], [place] [address] [place], [place] [address]    Report Status 04/26/2018 FINAL  Final  MRSA PCR Screening     Status: None   Collection Time: 04/26/18  1:08 AM  Result Value Ref Range Status   MRSA by PCR NEGATIVE NEGATIVE Final    Comment:        The GeneXpert MRSA Assay (FDA approved for NASAL specimens only), is one component of a comprehensive MRSA colonization surveillance program. It is not intended to diagnose MRSA infection nor to guide or monitor treatment for MRSA infections. Performed at [facility], [address] [place], [place] [address]   Body fluid culture     Status: None (Preliminary result)   Collection Time: 04/27/18  9:50 AM  Result Value Ref Range Status   Specimen Description   Final    PLEURAL Performed at [facility], [address] [place], [place] [address]    Special Requests   Final    NONE Performed at [facility], [address] [place], [place] [address]    Gram Stain   Final    RARE WBC PRESENT, PREDOMINANTLY PMN NO ORGANISMS SEEN    Culture   Final    NO GROWTH < 24 HOURS Performed at [facility], [place] [address] [place], [place] [address]    Report Status PENDING  Incomplete  Fungus Culture With Stain     Status: None (Preliminary result)   Collection Time: 04/27/18  9:50 AM  Result Value Ref Range Status   Fungus Stain Final report  Final    Comment: (NOTE) Performed At: [facility] [address] [place], [place] [id number] Rush Farmer MD TD:[id number]    Fungus (Mycology) Culture PENDING  Incomplete   Fungal Source PLEURAL  Final    Comment: Performed at [facility], [address], [place], [place] [address]  Fungus  Culture Result     Status: None   Collection Time:  04/27/18  9:50 AM  Result Value Ref Range Status   Result 1 Comment  Final    Comment: (NOTE) KOH/Calcofluor preparation:  no fungus observed. Performed At: [facility] [place], [place] [id number] Rush Farmer MD HQ:[id number]     Coagulation Studies: No results for input(s): LABPROT, INR in the last 72 hours.  Urinalysis: No results for input(s): COLORURINE, LABSPEC, PHURINE, GLUCOSEU, HGBUR, BILIRUBINUR, KETONESUR, PROTEINUR, UROBILINOGEN, NITRITE, LEUKOCYTESUR in the last 72 hours.  Invalid input(s): APPERANCEUR    Imaging: Dg Chest 2 View  Result Date: 04/29/2018 CLINICAL DATA:  Dry cough and pleural effusion, follow-up examination EXAM: CHEST - 2 VIEW COMPARISON:  04/27/2018 FINDINGS: Cardiac shadow is enlarged but stable. No pneumothorax is noted. Left basilar atelectasis with associated small effusion is seen stable from the previous exam. IMPRESSION: Stable left effusion and basilar atelectasis. No pneumothorax is seen. Electronically Signed   By: Inez Catalina M.D.   On: 04/29/2018 09:12   US Venous Img Lower Bilateral  Result Date: 04/28/2018 CLINICAL DATA:  57 year old male with bilateral lower extremity edema EXAM: BILATERAL LOWER EXTREMITY VENOUS DOPPLER ULTRASOUND TECHNIQUE: Gray-scale sonography with graded compression, as well as color Doppler and duplex ultrasound were performed to evaluate the lower extremity deep venous systems from the level of the common femoral vein and including the common femoral, femoral, profunda femoral, popliteal and calf veins including the posterior tibial, peroneal and gastrocnemius veins when visible. The superficial great saphenous vein was also interrogated. Spectral Doppler was utilized to evaluate flow at rest and with distal augmentation maneuvers in the common femoral, femoral and popliteal veins. COMPARISON:  None. FINDINGS: RIGHT LOWER EXTREMITY Common Femoral Vein: No evidence of thrombus. Normal  compressibility, respiratory phasicity and response to augmentation. Increased pulsatility of the venous waveform. Saphenofemoral Junction: No evidence of thrombus. Normal compressibility and flow on color Doppler imaging. Profunda Femoral Vein: No evidence of thrombus. Normal compressibility and flow on color Doppler imaging. Femoral Vein: No evidence of thrombus. Normal compressibility, respiratory phasicity and response to augmentation. Popliteal Vein: No evidence of thrombus. Normal compressibility, respiratory phasicity and response to augmentation. Calf Veins: No evidence of thrombus. Normal compressibility and flow on color Doppler imaging. Superficial Great Saphenous Vein: No evidence of thrombus. Normal compressibility. Venous Reflux:  None. Other Findings:  None. LEFT LOWER EXTREMITY Common Femoral Vein: No evidence of thrombus. Normal compressibility, respiratory phasicity and response to augmentation. Increased pulsatility of the venous waveform. Saphenofemoral Junction: No evidence of thrombus. Normal compressibility and flow on color Doppler imaging. Profunda Femoral Vein: No evidence of thrombus. Normal compressibility and flow on color Doppler imaging. Femoral Vein: No evidence of thrombus. Normal compressibility, respiratory phasicity and response to augmentation. Popliteal Vein: No evidence of thrombus. Normal compressibility, respiratory phasicity and response to augmentation. Calf Veins: No evidence of thrombus. Normal compressibility and flow on color Doppler imaging. Superficial Great Saphenous Vein: No evidence of thrombus. Normal compressibility. Venous Reflux:  None. Other Findings:  None. IMPRESSION: 1. No evidence of deep venous thrombosis in either lower extremity. 2. Increased pulsatility of the venous waveforms bilaterally suggesting elevated right heart pressures. Differential considerations include tricuspid regurgitation, right heart failure, COPD and pulmonary arterial hypertension.  Electronically Signed   By: Jacqulynn Cadet M.D.   On: 04/28/2018 16:52     Medications:    . abacavir-dolutegravir-lamiVUDine  1 tablet Oral Daily  . aspirin EC  325 mg Oral Daily  .  budesonide (PULMICORT) nebulizer solution  1 mg Nebulization BID  . divalproex  500 mg Oral BID  . enoxaparin (LOVENOX) injection  40 mg Subcutaneous Q24H  . feeding supplement (NEPRO CARB STEADY)  237 mL Oral BID BM  . ibuprofen  200 mg Oral Once  . iloperidone  12 mg Oral BID  . insulin aspart  0-15 Units Subcutaneous TID WC  . insulin aspart  0-5 Units Subcutaneous QHS  . insulin aspart  4 Units Subcutaneous TID WC  . losartan  25 mg Oral Daily  . metoprolol tartrate  25 mg Oral BID  . mirtazapine  30 mg Oral QHS  . pantoprazole  40 mg Oral BID  . rosuvastatin  40 mg Oral Daily  . sucralfate  1 g Oral BID  . venlafaxine XR  75 mg Oral Q breakfast  . cyanocobalamin  1,000 mcg Oral Daily   acetaminophen, benzonatate, bisacodyl, butalbital-acetaminophen-caffeine, chlorpheniramine-HYDROcodone, clonazePAM, diphenhydrAMINE, guaiFENesin-dextromethorphan, ipratropium-albuterol, nitroGLYCERIN, ondansetron (ZOFRAN) IV, oxyCODONE, senna-docusate  Assessment/ Plan:  [name]. is a 57 y.o. 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   1. 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 However unclear if this is due to a secondary cause. FSGS can be due to HIV, NSAIDs, obesity etc.  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  Baseline proteinuria is subnephrotic at 1.1 grams on 02/17/18.  Baseline creatinine of 1.2-1.4 Enalapril 2.5 mg daily - changed to ARB due to cough, losartan 25mg   daily.  No indication for renal biopsy.   2. Hypertension:  Blood pressure currently at goal.  - losartan as above  3. Anemia with chronic kidney disease: hemoglobin 8.5. Normocytic.  - Heme/onc consultation  4. Pneumonia: with fever and white count. Status post thoracentesis on 10/29.  - Appreciate Rhem, Pulm, Cards and ID input   LOS: 4 Danny Cook 10/31/201910:22 AM

## 2018-04-30 ENCOUNTER — Other Ambulatory Visit (HOSPITAL_COMMUNITY)
Admission: RE | Admit: 2018-04-30 | Disposition: A | Discharge status: Home / Self Care | Payer: Medicare Other | Source: Ambulatory Visit | Attending: Oncology | Admitting: Oncology

## 2018-04-30 ENCOUNTER — Inpatient Hospital Stay: Payer: Medicare Other

## 2018-04-30 LAB — FUNGAL ANTIBODIES PANEL, ID-BLOOD
ASPERGILLUS FLAVUS: NEGATIVE
ASPERGILLUS FUMIGATUS IGG: NEGATIVE
ASPERGILLUS NIGER: NEGATIVE
BLASTOMYCES ABS, QN, DID: NEGATIVE
HISTOPLASMA AB ID: NEGATIVE

## 2018-04-30 LAB — COMP PANEL: LEUKEMIA/LYMPHOMA

## 2018-04-30 LAB — CBC WITH DIFFERENTIAL/PLATELET
ABS IMMATURE GRANULOCYTES: 0.03 10*3/uL (ref 0.00–0.07)
Basophils Absolute: 0 10*3/uL (ref 0.0–0.1)
Basophils Relative: 1 %
Eosinophils Absolute: 0.1 10*3/uL (ref 0.0–0.5)
Eosinophils Relative: 2 %
HCT: 27.9 % — ABNORMAL LOW (ref 39.0–52.0)
HEMOGLOBIN: 8.8 g/dL — AB (ref 13.0–17.0)
Immature Granulocytes: 1 %
LYMPHS PCT: 30 %
Lymphs Abs: 1.9 10*3/uL (ref 0.7–4.0)
MCH: 28.9 pg (ref 26.0–34.0)
MCHC: 31.5 g/dL (ref 30.0–36.0)
MCV: 91.5 fL (ref 80.0–100.0)
MONO ABS: 0.7 10*3/uL (ref 0.1–1.0)
MONOS PCT: 10 %
NEUTROS ABS: 3.7 10*3/uL (ref 1.7–7.7)
Neutrophils Relative %: 56 %
Platelets: 231 10*3/uL (ref 150–400)
RBC: 3.05 MIL/uL — ABNORMAL LOW (ref 4.22–5.81)
RDW: 15.7 % — ABNORMAL HIGH (ref 11.5–15.5)
WBC: 6.5 10*3/uL (ref 4.0–10.5)
nRBC: 0 % (ref 0.0–0.2)

## 2018-04-30 LAB — CULTURE, BLOOD (ROUTINE X 2)
CULTURE: NO GROWTH
Culture: NO GROWTH

## 2018-04-30 LAB — PROTIME-INR
INR: 1.03
PROTHROMBIN TIME: 13.4 s (ref 11.4–15.2)

## 2018-04-30 LAB — GLUCOSE, CAPILLARY
GLUCOSE-CAPILLARY: 221 mg/dL — AB (ref 70–99)
GLUCOSE-CAPILLARY: 229 mg/dL — AB (ref 70–99)
Glucose-Capillary: 127 mg/dL — ABNORMAL HIGH (ref 70–99)
Glucose-Capillary: 165 mg/dL — ABNORMAL HIGH (ref 70–99)

## 2018-04-30 LAB — KAPPA/LAMBDA LIGHT CHAINS
KAPPA, LAMDA LIGHT CHAIN RATIO: 0.95 (ref 0.26–1.65)
Kappa free light chain: 38.3 mg/L — ABNORMAL HIGH (ref 3.3–19.4)
LAMDA FREE LIGHT CHAINS: 40.2 mg/L — AB (ref 5.7–26.3)

## 2018-04-30 LAB — BODY FLUID CULTURE: CULTURE: NO GROWTH

## 2018-04-30 MED ORDER — ENOXAPARIN SODIUM 40 MG/0.4ML ~~LOC~~ SOLN
40.0000 mg | SUBCUTANEOUS | Status: DC
  Administered 2018-05-01 – 2018-05-03 (×3): 40 mg via SUBCUTANEOUS
  Filled 2018-04-30 (×3): qty 0.4

## 2018-04-30 MED ORDER — FENTANYL CITRATE (PF) 100 MCG/2ML IJ SOLN
INTRAMUSCULAR | Status: AC | PRN
  Administered 2018-04-30: 50 ug via INTRAVENOUS
  Administered 2018-04-30 (×2): 25 ug via INTRAVENOUS

## 2018-04-30 MED ORDER — MIDAZOLAM HCL 5 MG/5ML IJ SOLN
INTRAMUSCULAR | Status: AC
  Filled 2018-04-30: qty 5

## 2018-04-30 MED ORDER — ABACAVIR-DOLUTEGRAVIR-LAMIVUD 600-50-300 MG PO TABS
1.0000 | ORAL_TABLET | Freq: Every day | ORAL | Status: DC
  Administered 2018-05-01 – 2018-05-03 (×3): 1 via ORAL
  Filled 2018-04-30 (×3): qty 1

## 2018-04-30 MED ORDER — MIDAZOLAM HCL 5 MG/5ML IJ SOLN
INTRAMUSCULAR | Status: AC | PRN
  Administered 2018-04-30 (×3): 1 mg via INTRAVENOUS

## 2018-04-30 MED ORDER — DIAZEPAM 2 MG PO TABS
2.0000 mg | ORAL_TABLET | Freq: Three times a day (TID) | ORAL | Status: DC | PRN
  Administered 2018-04-30 – 2018-05-01 (×3): 2 mg via ORAL
  Filled 2018-04-30 (×3): qty 1

## 2018-04-30 MED ORDER — FENTANYL CITRATE (PF) 100 MCG/2ML IJ SOLN
INTRAMUSCULAR | Status: AC
  Filled 2018-04-30: qty 4

## 2018-04-30 MED ORDER — HEPARIN SOD (PORK) LOCK FLUSH 100 UNIT/ML IV SOLN
INTRAVENOUS | Status: AC
  Filled 2018-04-30: qty 5

## 2018-04-30 NOTE — H&P (Signed)
Chief Complaint: Concern for MGUS    Referring Physician(s): Yu   Patient Status: [facility] - In-pt  History of Present Illness: Danny Cook. is a 57 y.o. male with HIV, DM, FUO, and concern for MGUS.  Plan for CT BM asp and core bx today as an inpt  Past Medical History:  Diagnosis Date  . Anemia   . Anxiety   . Arthritis   . CHF (congestive heart failure) ([place])   . Chronic kidney disease    Renal Insufficiency Syndrome; Glomerulosclerosis 2013  . Complication of anesthesia   . Depressed   . Diabetes mellitus without complication ([place])   . High cholesterol   . HIV (human immunodeficiency virus infection) ([place])   . Hypertension   . 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 Paranoid disorder ([place])   . Schizophrenia, paranoid ([place])   . Sleep apnea     Past Surgical History:  Procedure Laterality Date  . COLONOSCOPY WITH PROPOFOL N/A 09/03/2015   Procedure: COLONOSCOPY WITH PROPOFOL;  Surgeon: Lollie Sails, MD;  Location: [facility] ENDOSCOPY;  Service: Endoscopy;  Laterality: N/A;  . DG TEETH FULL    . ESOPHAGOGASTRODUODENOSCOPY (EGD) WITH PROPOFOL N/A 07/01/2016   Procedure: ESOPHAGOGASTRODUODENOSCOPY (EGD) WITH PROPOFOL;  Surgeon: Lollie Sails, MD;  Location: [facility] ENDOSCOPY;  Service: Endoscopy;  Laterality: N/A;    Allergies: Trazodone and nefazodone; Viread [tenofovir disoproxil]; and Etodolac  Medications: Prior to Admission medications   Medication Sig Start Date End Date Taking? Authorizing Provider  Abacavir-Dolutegravir-Lamivud (TRIUMEQ) 600-50-300 MG TABS Take 1 tablet by mouth daily.   Yes [provider]  albuterol (PROVENTIL HFA;VENTOLIN HFA) 108 (90 Base) MCG/ACT inhaler Inhale 2 puffs into the lungs every 6 (six) hours as needed for wheezing or shortness of breath. 04/14/18  Yes Wieting, Richard, MD  amiodarone (PACERONE) 100 MG tablet Take 1 tablet (100 mg total) by mouth daily. 04/14/18  Yes Loletha Grayer, MD  aspirin EC 325 MG tablet  Take 325 mg by mouth daily.   Yes [provider]  benzonatate (TESSALON PERLES) 100 MG capsule Take 1 capsule (100 mg total) by mouth every 6 (six) hours as needed for cough. 04/24/18 04/24/19 Yes McShane, Gerda Diss, MD  butalbital-acetaminophen-caffeine (FIORICET, ESGIC) [phone] MG tablet Take 1-2 tablets by mouth every 6 (six) hours as needed. Patient taking differently: Take 1-2 tablets by mouth every 6 (six) hours as needed for migraine.  01/31/18 01/31/19 Yes Earleen Newport, MD  chlorpheniramine-HYDROcodone (TUSSIONEX) 10-8 MG/5ML SUER Take 5 mLs by mouth every 12 (twelve) hours as needed for cough. 04/14/18  Yes Wieting, Richard, MD  clonazePAM (KLONOPIN) 1 MG tablet Take 1 mg by mouth 2 (two) times daily. Take 1 mg in the morning, 0.5 mg mid-day and 1 mg in the evening.   Yes [provider]  cyanocobalamin 1000 MCG tablet Take 1,000 mcg by mouth daily.   Yes [provider]  diphenhydrAMINE (BENADRYL) 50 MG capsule Take 1 capsule (50 mg total) by mouth at bedtime as needed for sleep. 11/09/17  Yes McNew, Tyson Babinski, MD  divalproex (DEPAKOTE ER) 250 MG 24 hr tablet Take 500 mg by mouth 2 (two) times daily.    Yes [provider]  doxycycline (VIBRAMYCIN) 100 MG capsule Take 1 capsule (100 mg total) by mouth 2 (two) times daily. 04/24/18  Yes Schuyler Amor, MD  enalapril (VASOTEC) 2.5 MG tablet Take 1 tablet (2.5 mg total) by mouth daily. 03/28/18  Yes  Wieting, Richard, MD  FANAPT 12 MG TABS Take 12 mg by mouth 2 (two) times daily.  11/14/16  Yes [provider]  fluticasone (FLOVENT HFA) 220 MCG/ACT inhaler Inhale 2 puffs into the lungs 2 (two) times daily. Rinse out mouth afterwards 04/14/18  Yes Wieting, Richard, MD  Insulin Degludec (TRESIBA FLEXTOUCH) 200 UNIT/ML SOPN Inject into the skin daily.   Yes [provider]  liraglutide (VICTOZA) 18 MG/3ML SOPN Inject 1.8 mg into the skin See admin instructions. Daily if blood sugar is greater  than 85; patient uses tresiba if blood sugar is less than 85   Yes [provider]  metFORMIN (GLUCOPHAGE) 1000 MG tablet Take 1,000 mg by mouth 2 (two) times daily with a meal.   Yes [provider]  mirtazapine (REMERON) 30 MG tablet Take 30 mg by mouth at bedtime. 04/21/18  Yes [provider]  nitroGLYCERIN (NITROSTAT) 0.4 MG SL tablet Place 0.4 mg under the tongue every 5 (five) minutes as needed for chest pain.    Yes [provider]  Nutritional Supplements (FEEDING SUPPLEMENT, NEPRO CARB STEADY,) LIQD Take 237 mLs by mouth 2 (two) times daily between meals. 03/27/18  Yes Wieting, Richard, MD  omeprazole (PRILOSEC) 40 MG capsule Take 40 mg by mouth 2 (two) times daily.   Yes [provider]  promethazine (PHENERGAN) 6.25 MG/5ML syrup Take 6.25 mg by mouth every 6 (six) hours as needed for nausea or vomiting.   Yes [provider]  rosuvastatin (CRESTOR) 20 MG tablet Take 40 mg by mouth daily.    Yes [provider]  sucralfate (CARAFATE) 1 g tablet Take 1 g by mouth 2 (two) times daily. 04/21/18  Yes [provider]  triamcinolone cream (KENALOG) 0.1 % Apply 1 application topically 2 (two) times daily as needed. Patient taking differently: Apply 1 application topically 2 (two) times daily as needed (for rash).  11/16/16  Yes Little, Traci M, PA-C  venlafaxine XR (EFFEXOR-XR) 75 MG 24 hr capsule Take 1 capsule (75 mg total) by mouth daily with breakfast. 11/09/17  Yes McNew, Tyson Babinski, MD  predniSONE (DELTASONE) 10 MG tablet 4 tabs po daily for 3 dyays Patient not taking: Reported on 04/25/2018 04/14/18   Loletha Grayer, MD     Family History  Problem Relation Age of Onset  . Heart attack Mother   . Diabetes Mellitus II Mother   . Mental illness Mother   . CAD Mother   . Heart attack Father   . CAD Father   . Hypertension Father     Social History   Socioeconomic History  . Marital status: Single    Spouse name:  Not on file  . Number of children: Not on file  . Years of education: 52  . Highest education level: GED or equivalent  Occupational History  . Not on file  Social Needs  . Financial resource strain: Not hard at all  . Food insecurity:    Worry: Sometimes true    Inability: Never true  . Transportation needs:    Medical: No    Non-medical: No  Tobacco Use  . Smoking status: Never Smoker  . Smokeless tobacco: Never Used  Substance and Sexual Activity  . Alcohol use: No  . Drug use: No  . Sexual activity: Never  Lifestyle  . Physical activity:    Days per week: 0 days    Minutes per session: 0 min  . Stress: To some extent  Relationships  .  Social connections:    Talks on phone: More than three times a week    Gets together: Three times a week    Attends religious service: Never    Active member of club or organization: No    Attends meetings of clubs or organizations: Never    Relationship status: Never married  Other Topics Concern  . Not on file  Social History Narrative  . Not on file      Review of Systems: A 12 point ROS discussed and pertinent positives are indicated in the HPI above.  All other systems are negative.  Review of Systems  Vital Signs: BP 112/72 (BP Location: Right Arm)   Pulse 62   Temp 98.3 F (36.8 C) (Oral)   Resp 20   Ht 5\' 7"  (1.702 m)   Wt 93 kg   SpO2 95%   BMI 32.11 kg/m   Physical Exam  Constitutional:  Ill appearing male in bed but no distress   Eyes: Conjunctivae are normal. No scleral icterus.  Cardiovascular: Normal rate and regular rhythm.  Pulmonary/Chest: Effort normal and breath sounds normal.  Abdominal: Soft. Bowel sounds are normal.  Skin: He is not diaphoretic.    Imaging: Ct Abdomen Pelvis Wo Contrast  Result Date: 04/30/2018 CLINICAL DATA:  Abdominal infections. Persistent fever. Generalized abdominal pain. EXAM: CT ABDOMEN AND PELVIS WITHOUT CONTRAST TECHNIQUE: Multidetector CT imaging of the abdomen  and pelvis was performed following the standard protocol without IV contrast. COMPARISON:  02/27/2018, 06/21/2016 and 05/23/2015 FINDINGS: Lower chest: Small left pleural effusion with associated compressive atelectasis which is new. Tiny amount right pleural fluid. Minimal calcified plaque over the left anterior descending coronary artery. Small pericardial effusion which is new. Hepatobiliary: Gallbladder, liver and biliary tree are normal. Pancreas: Normal. Spleen: Normal. Adrenals/Urinary Tract: Right adrenal gland is normal. 3.7 cm left adrenal mass compatible with an adenoma and unchanged. Kidneys are normal in size. Stable 1.1 cm stone over the mid pole right kidney. No left renal stones. No hydronephrosis. Ureters and bladder are normal. Stomach/Bowel: Stomach and small bowel are normal. Appendix is normal. Colon is normal. Vascular/Lymphatic: Minimal calcified plaque involving the abdominal aorta. No adenopathy. Reproductive: Normal. Other: No free fluid or focal inflammatory change. Musculoskeletal: Degenerative changes of the spine and hips. IMPRESSION: No acute findings in the abdomen/pelvis. Stable nonobstructing 1.1 cm right renal stone. New small left effusion with associated compressive atelectasis and tiny amount right pleural fluid. New small pericardial effusion. Stable 3.7 cm left adrenal adenoma. Aortic Atherosclerosis (ICD10-I70.0). Mild atherosclerotic coronary artery disease. Electronically Signed   By: Marin Olp M.D.   On: 04/30/2018 02:26   Dg Chest 2 View  Result Date: 04/29/2018 CLINICAL DATA:  Dry cough and pleural effusion, follow-up examination EXAM: CHEST - 2 VIEW COMPARISON:  04/27/2018 FINDINGS: Cardiac shadow is enlarged but stable. No pneumothorax is noted. Left basilar atelectasis with associated small effusion is seen stable from the previous exam. IMPRESSION: Stable left effusion and basilar atelectasis. No pneumothorax is seen. Electronically Signed   By: Inez Catalina  M.D.   On: 04/29/2018 09:12   Dg Chest 2 View  Result Date: 04/24/2018 CLINICAL DATA:  Dry cough, fever, and chills since yesterday. Patient also states SOB on exertion. Patient states that he was discharged from hospital 10 days ago where he was admitted with pneumonia. Hx of CHF, diabetes, Kaposi's sarcoma, HIV. EXAM: CHEST - 2 VIEW COMPARISON:  04/12/2018 and older exams FINDINGS: There is increased opacity at the left  base now obscuring the left heart border as well as the hemidiaphragm. Remainder of the lungs is clear. Small to moderate left pleural effusion. No right pleural effusion. No pneumothorax. Cardiac silhouette is normal in size and configuration. No mediastinal or hilar masses. No evidence of adenopathy. Skeletal structures are unremarkable. IMPRESSION: 1. Increased opacity at the left lung base since the prior exams. This is consistent with a combination of pleural fluid with atelectasis and/or pneumonia. 2. No other abnormality.  No pulmonary edema. Electronically Signed   By: Lajean Manes M.D.   On: 04/24/2018 16:01   Ct Chest Wo Contrast  Result Date: 04/29/2018 CLINICAL DATA:  Pleural effusion, cough. EXAM: CT CHEST WITHOUT CONTRAST TECHNIQUE: Multidetector CT imaging of the chest was performed following the standard protocol without IV contrast. COMPARISON:  Radiographs of same day.  CT scan of April 14, 2018. FINDINGS: Cardiovascular: 4.2 cm ascending thoracic aortic aneurysm is noted. Small pericardial effusion is noted. Mild coronary artery calcifications are noted. Mediastinum/Nodes: No enlarged mediastinal or axillary lymph nodes. Thyroid gland, trachea, and esophagus demonstrate no significant findings. Lungs/Pleura: No pneumothorax is noted. Right lung is clear. Mild left pleural effusion is noted with associated atelectasis of the left lower lobe. Upper Abdomen: Stable left adrenal adenoma. No other abnormality seen in visualized portion of upper abdomen. Musculoskeletal:  No chest wall mass or suspicious bone lesions identified. IMPRESSION: 4.2 cm ascending thoracic aortic aneurysm. Recommend annual imaging followup by CTA or MRA. This recommendation follows 2010 ACCF/AHA/AATS/ACR/ASA/SCA/SCAI/SIR/STS/SVM Guidelines for the Diagnosis and Management of Patients with Thoracic Aortic Disease. Circulation. 2010; 121: Z308-M578. Stable small pericardial effusion. Mild coronary artery calcifications are noted. Stable mild left pleural effusion is noted with associated atelectasis of the left lower lobe. Stable left adrenal adenoma. Electronically Signed   By: Marijo Conception, M.D.   On: 04/29/2018 13:33   Ct Chest Wo Contrast  Result Date: 04/14/2018 CLINICAL DATA:  57 year old male with a history of fever and cough EXAM: CT CHEST WITHOUT CONTRAST TECHNIQUE: Multidetector CT imaging of the chest was performed following the standard protocol without IV contrast. COMPARISON:  Chest x-ray 04/12/2018, CT 07/25/2017 FINDINGS: Cardiovascular: Heart size unchanged, not enlarged. New small pericardial fluid/thickening compared to the CT of 07/25/2017. Calcifications of the left anterior descending coronary artery. Unremarkable course caliber and contour of the thoracic aorta. Differential attenuation of the blood pool and the myocardium. Mediastinum/Nodes: Multiple lymph nodes of the mediastinum, potentially reactive. Unremarkable appearance of the thoracic esophagus. Unremarkable thoracic inlet. Lungs/Pleura: Small left-sided pleural effusion with associated atelectasis. No confluent airspace disease. No pneumothorax. No evidence of pulmonary edema. Upper Abdomen: No acute finding of the upper abdomen. Musculoskeletal: No acute displaced fracture. Degenerative changes of the spine. No bony canal narrowing. IMPRESSION: Small left pleural effusion with associated atelectasis. New small pericardial effusion compared to the prior CT. CT demonstrates evidence of anemia. Correlation with lab  values may be useful. Single vessel coronary artery disease. Electronically Signed   By: Corrie Mckusick D.O.   On: 04/14/2018 14:19   Dg Chest Left Decubitus  Result Date: 04/26/2018 CLINICAL DATA:  57 year old male. Follow-up pleural effusions. Subsequent encounter. EXAM: CHEST - LEFT DECUBITUS COMPARISON:  04/25/2018 chest x-ray. FINDINGS: Left-side down decubitus view of the chest reveals left pleural fluid layers in a dependent position. IMPRESSION: Left pleural effusion layers on decubitus view. Electronically Signed   By: Genia Del M.D.   On: 04/26/2018 15:01   US Venous Img Lower Bilateral  Result Date: 04/28/2018 CLINICAL  DATA:  57 year old male with bilateral lower extremity edema EXAM: BILATERAL LOWER EXTREMITY VENOUS DOPPLER ULTRASOUND TECHNIQUE: Gray-scale sonography with graded compression, as well as color Doppler and duplex ultrasound were performed to evaluate the lower extremity deep venous systems from the level of the common femoral vein and including the common femoral, femoral, profunda femoral, popliteal and calf veins including the posterior tibial, peroneal and gastrocnemius veins when visible. The superficial great saphenous vein was also interrogated. Spectral Doppler was utilized to evaluate flow at rest and with distal augmentation maneuvers in the common femoral, femoral and popliteal veins. COMPARISON:  None. FINDINGS: RIGHT LOWER EXTREMITY Common Femoral Vein: No evidence of thrombus. Normal compressibility, respiratory phasicity and response to augmentation. Increased pulsatility of the venous waveform. Saphenofemoral Junction: No evidence of thrombus. Normal compressibility and flow on color Doppler imaging. Profunda Femoral Vein: No evidence of thrombus. Normal compressibility and flow on color Doppler imaging. Femoral Vein: No evidence of thrombus. Normal compressibility, respiratory phasicity and response to augmentation. Popliteal Vein: No evidence of thrombus.  Normal compressibility, respiratory phasicity and response to augmentation. Calf Veins: No evidence of thrombus. Normal compressibility and flow on color Doppler imaging. Superficial Great Saphenous Vein: No evidence of thrombus. Normal compressibility. Venous Reflux:  None. Other Findings:  None. LEFT LOWER EXTREMITY Common Femoral Vein: No evidence of thrombus. Normal compressibility, respiratory phasicity and response to augmentation. Increased pulsatility of the venous waveform. Saphenofemoral Junction: No evidence of thrombus. Normal compressibility and flow on color Doppler imaging. Profunda Femoral Vein: No evidence of thrombus. Normal compressibility and flow on color Doppler imaging. Femoral Vein: No evidence of thrombus. Normal compressibility, respiratory phasicity and response to augmentation. Popliteal Vein: No evidence of thrombus. Normal compressibility, respiratory phasicity and response to augmentation. Calf Veins: No evidence of thrombus. Normal compressibility and flow on color Doppler imaging. Superficial Great Saphenous Vein: No evidence of thrombus. Normal compressibility. Venous Reflux:  None. Other Findings:  None. IMPRESSION: 1. No evidence of deep venous thrombosis in either lower extremity. 2. Increased pulsatility of the venous waveforms bilaterally suggesting elevated right heart pressures. Differential considerations include tricuspid regurgitation, right heart failure, COPD and pulmonary arterial hypertension. Electronically Signed   By: Jacqulynn Cadet M.D.   On: 04/28/2018 16:52   Dg Chest Port 1 View  Result Date: 04/27/2018 CLINICAL DATA:  Status post thoracentesis. EXAM: PORTABLE CHEST 1 VIEW COMPARISON:  Ultrasound 04/27/2018.  Chest x-ray 04/26/2018. FINDINGS: Interval left-sided thoracentesis. Decrease in left-sided pleural effusion. Left base atelectasis. No pneumothorax. Stable cardiomegaly. IMPRESSION: 1. Interval left thoracentesis with decrease in left-sided pleural  effusion. Left base atelectasis. No pneumothorax. 2.  Stable cardiomegaly. Electronically Signed   By: Marcello Moores  Register   On: 04/27/2018 10:42   Dg Chest Port 1 View  Result Date: 04/25/2018 CLINICAL DATA:  57 y/o  M; cough and shortness of breath. EXAM: PORTABLE CHEST 1 VIEW COMPARISON:  04/24/2018 chest radiograph. FINDINGS: Normal cardiac silhouette. Stable small to moderate left pleural effusion and basilar opacity. No new consolidation. No pneumothorax. No acute osseous abnormality is evident. IMPRESSION: Stable small to moderate left pleural effusion and basilar opacity which may represent associated atelectasis or pneumonia. Electronically Signed   By: Kristine Garbe M.D.   On: 04/25/2018 03:59   Dg Chest Port 1 View  Result Date: 04/12/2018 CLINICAL DATA:  Nonproductive cough, fever, chills EXAM: PORTABLE CHEST 1 VIEW COMPARISON:  03/29/2018 FINDINGS: Cardiomegaly with vascular congestion. Small left pleural effusion with left lower lobe atelectasis or infiltrate. Minimal right basilar opacity as  well. No overt edema. No effusions scattered that no acute bony abnormality. IMPRESSION: Small left effusion. Bilateral lower lobe atelectasis or infiltrates, left greater than right. Mild cardiomegaly with vascular congestion. Electronically Signed   By: Rolm Baptise M.D.   On: 04/12/2018 11:26   US Thoracentesis Asp Pleural Space W/img Guide  Result Date: 04/27/2018 INDICATION: Left pleural effusion EXAM: ULTRASOUND GUIDED LEFT THORACENTESIS MEDICATIONS: None. COMPLICATIONS: None immediate. PROCEDURE: An ultrasound guided thoracentesis was thoroughly discussed with the patient and questions answered. The benefits, risks, alternatives and complications were also discussed. The patient understands and wishes to proceed with the procedure. Written consent was obtained. Ultrasound was performed to localize and mark an adequate pocket of fluid in the left chest. The area was then prepped and  draped in the normal sterile fashion. 1% Lidocaine was used for local anesthesia. Under ultrasound guidance a 6 Fr Safe-T-Centesis catheter was introduced. Thoracentesis was performed. The catheter was removed and a dressing applied. FINDINGS: A total of approximately 1200 cc of clear yellow fluid was removed. Samples were sent to the laboratory as requested by the clinical team. IMPRESSION: Successful ultrasound guided left thoracentesis yielding 1200 cc of pleural fluid. Electronically Signed   By: Marybelle Killings M.D.   On: 04/27/2018 10:04    Labs:  CBC: Recent Labs    04/26/18 1811 04/27/18 0359 04/28/18 0634 04/30/18 0422  WBC 14.1* 15.1* 9.5 6.5  HGB 9.3* 8.7* 8.5* 8.8*  HCT 29.4* 27.9* 27.2* 27.9*  PLT 167 151 168 231    COAGS: Recent Labs    03/23/18 2043 03/25/18 0108 03/25/18 0539 04/30/18 0422  INR 1.34 1.22 1.19 1.03  APTT 155*  --  39*  --     BMP: Recent Labs    04/25/18 0316 04/26/18 1811 04/27/18 0359 04/28/18 0634  NA 138 137 140 141  K 3.7 3.8 3.5 3.6  CL 102 108 105 105  CO2 25 22 26 27   GLUCOSE 214* 246* 238* 117*  BUN 17 16 17 15   CALCIUM 8.9 8.0* 7.9* 8.3*  CREATININE 1.41* 1.59* 1.54* 1.52*  GFRNONAA 53* 46* 48* 48*  GFRAA >60 53* 55* 56*    LIVER FUNCTION TESTS: Recent Labs    04/12/18 1101 04/24/18 1444 04/25/18 0316 04/28/18 1718  BILITOT 0.6 1.0 0.8 0.4  AST 28 12* 13* 53*  ALT 27 16 16  35  ALKPHOS 80 92 85 72  PROT 7.1 7.3 7.1 6.7  ALBUMIN 2.8* 3.3* 3.3* 2.4*    TUMOR MARKERS: No results for input(s): AFPTM, CEA, CA199, CHROMGRNA in the last 8760 hours.  Assessment and Plan:  HIV, FUO, anemia, concern for MGUS.  CT BM asp and core bx today.  Risks and benefits discussed with the patient including, but not limited to bleeding, infection, damage to adjacent structures or low yield requiring additional tests.  All of the patient's questions were answered, patient is agreeable to proceed. Consent signed and in  chart.    Thank you for this interesting consult.  I greatly enjoyed meeting Danny Cook. and look forward to participating in their care.  A copy of this report was sent to the requesting provider on this date.  Electronically Signed: Greggory Keen, MD 04/30/2018, 8:08 AM   I spent a total of 20 Minutes    in face to face in clinical consultation, greater than 50% of which was counseling/coordinating care for with HIV, FUO, possible MGUS.

## 2018-04-30 NOTE — Progress Notes (Signed)
Patient requesting to eat. MD paged to ask. Per Dr. Anselm Jungling he would like to round on patient first. Patient notified, will continue to monitor patient.

## 2018-04-30 NOTE — Progress Notes (Signed)
Pleural effusionCT showed stable leftChest CT yesterday showed a stable mild left pleural effusion.  Patient Name: Danny Cook. Date of Encounter: 04/30/2018  Hospital Problem List     Active Problems:   Healthcare associated bacterial pneumonia   FSGS (focal segmental glomerulosclerosis)   Pleural effusion   FUO (fever of unknown origin)   MGUS (monoclonal gammopathy of unknown significance)    Patient Profile     Danny Cookis a 57 y.o.malewith a hx of HIV disease, diabetes,FSG induced chronic kidney disease, history of paroxysmal atrial fibrillation and pleural effusion.   Subjective   Feeling more short of breath today.  Actually shows sinus rhythm/sinus bradycardia.  Had episodic sick sinus syndrome yesterday afternoon.  He is not symptomatic at present.  Inpatient Medications    . fentaNYL      . heparin lock flush      . midazolam      . abacavir-dolutegravir-lamiVUDine  1 tablet Oral Daily  . aspirin EC  325 mg Oral Daily  . budesonide (PULMICORT) nebulizer solution  1 mg Nebulization BID  . divalproex  500 mg Oral BID  . enoxaparin (LOVENOX) injection  40 mg Subcutaneous Q24H  . feeding supplement (NEPRO CARB STEADY)  237 mL Oral BID BM  . ibuprofen  200 mg Oral Once  . iloperidone  12 mg Oral BID  . insulin aspart  0-15 Units Subcutaneous TID WC  . insulin aspart  0-5 Units Subcutaneous QHS  . insulin aspart  4 Units Subcutaneous TID WC  . losartan  25 mg Oral Daily  . metoprolol tartrate  25 mg Oral BID  . mirtazapine  30 mg Oral QHS  . pantoprazole  40 mg Oral BID  . rosuvastatin  40 mg Oral Daily  . sucralfate  1 g Oral BID  . venlafaxine XR  75 mg Oral Q breakfast  . cyanocobalamin  1,000 mcg Oral Daily    Vital Signs    Vitals:   04/29/18 1613 04/29/18 2103 04/29/18 2146 04/30/18 0548  BP: 106/67  132/73 104/69  Pulse: 64  66 65  Resp: 12     Temp: 97.8 F (36.6 C)  98.2 F (36.8 C) 99.2 F (37.3 C)  TempSrc:  Oral  Oral Oral  SpO2: 100% 100% 99% 93%  Weight:      Height:        Intake/Output Summary (Last 24 hours) at 04/30/2018 0736 Last data filed at 04/30/2018 0548 Gross per 24 hour  Intake 300 ml  Output 925 ml  Net -625 ml   Filed Weights   04/26/18 0003  Weight: 93 kg    Physical Exam    GEN: Well nourished, well developed, in no acute distress.  HEENT: normal.  Neck: Supple, no JVD, carotid bruits, or masses. Cardiac: Bradycardia, no murmurs, rubs, or gallops. No clubbing, cyanosis, edema.  Radials/DP/PT 2+ and equal bilaterally.  Respiratory:  Respirations regular and unlabored, clear to auscultation bilaterally. GI: Soft, nontender, nondistended, BS + x 4. MS: no deformity or atrophy. Skin: warm and dry, no rash. Neuro:  Strength and sensation are intact. Psych: Normal affect.  Labs    CBC Recent Labs    04/28/18 0634 04/30/18 0422  WBC 9.5 6.5  NEUTROABS  --  3.7  HGB 8.5* 8.8*  HCT 27.2* 27.9*  MCV 93.5 91.5  PLT 168 734   Basic Metabolic Panel Recent Labs    04/28/18 0634  NA 141  K 3.6  CL  105  CO2 27  GLUCOSE 117*  BUN 15  CREATININE 1.52*  CALCIUM 8.3*   Liver Function Tests Recent Labs    04/28/18 1718  AST 53*  ALT 35  ALKPHOS 72  BILITOT 0.4  PROT 6.7  ALBUMIN 2.4*   No results for input(s): LIPASE, AMYLASE in the last 72 hours. Cardiac Enzymes Recent Labs    04/29/18 1058  CKTOTAL 23*   BNP No results for input(s): BNP in the last 72 hours. D-Dimer No results for input(s): DDIMER in the last 72 hours. Hemoglobin A1C No results for input(s): HGBA1C in the last 72 hours. Fasting Lipid Panel No results for input(s): CHOL, HDL, LDLCALC, TRIG, CHOLHDL, LDLDIRECT in the last 72 hours. Thyroid Function Tests No results for input(s): TSH, T4TOTAL, T3FREE, THYROIDAB in the last 72 hours.  Invalid input(s): FREET3  Telemetry    Sinus bradycardia  ECG      Radiology    Ct Abdomen Pelvis Wo Contrast  Result Date:  04/30/2018 CLINICAL DATA:  Abdominal infections. Persistent fever. Generalized abdominal pain. EXAM: CT ABDOMEN AND PELVIS WITHOUT CONTRAST TECHNIQUE: Multidetector CT imaging of the abdomen and pelvis was performed following the standard protocol without IV contrast. COMPARISON:  02/27/2018, 06/21/2016 and 05/23/2015 FINDINGS: Lower chest: Small left pleural effusion with associated compressive atelectasis which is new. Tiny amount right pleural fluid. Minimal calcified plaque over the left anterior descending coronary artery. Small pericardial effusion which is new. Hepatobiliary: Gallbladder, liver and biliary tree are normal. Pancreas: Normal. Spleen: Normal. Adrenals/Urinary Tract: Right adrenal gland is normal. 3.7 cm left adrenal mass compatible with an adenoma and unchanged. Kidneys are normal in size. Stable 1.1 cm stone over the mid pole right kidney. No left renal stones. No hydronephrosis. Ureters and bladder are normal. Stomach/Bowel: Stomach and small bowel are normal. Appendix is normal. Colon is normal. Vascular/Lymphatic: Minimal calcified plaque involving the abdominal aorta. No adenopathy. Reproductive: Normal. Other: No free fluid or focal inflammatory change. Musculoskeletal: Degenerative changes of the spine and hips. IMPRESSION: No acute findings in the abdomen/pelvis. Stable nonobstructing 1.1 cm right renal stone. New small left effusion with associated compressive atelectasis and tiny amount right pleural fluid. New small pericardial effusion. Stable 3.7 cm left adrenal adenoma. Aortic Atherosclerosis (ICD10-I70.0). Mild atherosclerotic coronary artery disease. Electronically Signed   By: Marin Olp M.D.   On: 04/30/2018 02:26   Dg Chest 2 View  Result Date: 04/29/2018 CLINICAL DATA:  Dry cough and pleural effusion, follow-up examination EXAM: CHEST - 2 VIEW COMPARISON:  04/27/2018 FINDINGS: Cardiac shadow is enlarged but stable. No pneumothorax is noted. Left basilar atelectasis  with associated small effusion is seen stable from the previous exam. IMPRESSION: Stable left effusion and basilar atelectasis. No pneumothorax is seen. Electronically Signed   By: Inez Catalina M.D.   On: 04/29/2018 09:12   Dg Chest 2 View  Result Date: 04/24/2018 CLINICAL DATA:  Dry cough, fever, and chills since yesterday. Patient also states SOB on exertion. Patient states that he was discharged from hospital 10 days ago where he was admitted with pneumonia. Hx of CHF, diabetes, Kaposi's sarcoma, HIV. EXAM: CHEST - 2 VIEW COMPARISON:  04/12/2018 and older exams FINDINGS: There is increased opacity at the left base now obscuring the left heart border as well as the hemidiaphragm. Remainder of the lungs is clear. Small to moderate left pleural effusion. No right pleural effusion. No pneumothorax. Cardiac silhouette is normal in size and configuration. No mediastinal or hilar masses. No evidence of  adenopathy. Skeletal structures are unremarkable. IMPRESSION: 1. Increased opacity at the left lung base since the prior exams. This is consistent with a combination of pleural fluid with atelectasis and/or pneumonia. 2. No other abnormality.  No pulmonary edema. Electronically Signed   By: Lajean Manes M.D.   On: 04/24/2018 16:01   Ct Chest Wo Contrast  Result Date: 04/29/2018 CLINICAL DATA:  Pleural effusion, cough. EXAM: CT CHEST WITHOUT CONTRAST TECHNIQUE: Multidetector CT imaging of the chest was performed following the standard protocol without IV contrast. COMPARISON:  Radiographs of same day.  CT scan of April 14, 2018. FINDINGS: Cardiovascular: 4.2 cm ascending thoracic aortic aneurysm is noted. Small pericardial effusion is noted. Mild coronary artery calcifications are noted. Mediastinum/Nodes: No enlarged mediastinal or axillary lymph nodes. Thyroid gland, trachea, and esophagus demonstrate no significant findings. Lungs/Pleura: No pneumothorax is noted. Right lung is clear. Mild left pleural  effusion is noted with associated atelectasis of the left lower lobe. Upper Abdomen: Stable left adrenal adenoma. No other abnormality seen in visualized portion of upper abdomen. Musculoskeletal: No chest wall mass or suspicious bone lesions identified. IMPRESSION: 4.2 cm ascending thoracic aortic aneurysm. Recommend annual imaging followup by CTA or MRA. This recommendation follows 2010 ACCF/AHA/AATS/ACR/ASA/SCA/SCAI/SIR/STS/SVM Guidelines for the Diagnosis and Management of Patients with Thoracic Aortic Disease. Circulation. 2010; 121: Z610-R604. Stable small pericardial effusion. Mild coronary artery calcifications are noted. Stable mild left pleural effusion is noted with associated atelectasis of the left lower lobe. Stable left adrenal adenoma. Electronically Signed   By: Marijo Conception, M.D.   On: 04/29/2018 13:33   Ct Chest Wo Contrast  Result Date: 04/14/2018 CLINICAL DATA:  57 year old male with a history of fever and cough EXAM: CT CHEST WITHOUT CONTRAST TECHNIQUE: Multidetector CT imaging of the chest was performed following the standard protocol without IV contrast. COMPARISON:  Chest x-ray 04/12/2018, CT 07/25/2017 FINDINGS: Cardiovascular: Heart size unchanged, not enlarged. New small pericardial fluid/thickening compared to the CT of 07/25/2017. Calcifications of the left anterior descending coronary artery. Unremarkable course caliber and contour of the thoracic aorta. Differential attenuation of the blood pool and the myocardium. Mediastinum/Nodes: Multiple lymph nodes of the mediastinum, potentially reactive. Unremarkable appearance of the thoracic esophagus. Unremarkable thoracic inlet. Lungs/Pleura: Small left-sided pleural effusion with associated atelectasis. No confluent airspace disease. No pneumothorax. No evidence of pulmonary edema. Upper Abdomen: No acute finding of the upper abdomen. Musculoskeletal: No acute displaced fracture. Degenerative changes of the spine. No bony canal  narrowing. IMPRESSION: Small left pleural effusion with associated atelectasis. New small pericardial effusion compared to the prior CT. CT demonstrates evidence of anemia. Correlation with lab values may be useful. Single vessel coronary artery disease. Electronically Signed   By: Corrie Mckusick D.O.   On: 04/14/2018 14:19   Dg Chest Left Decubitus  Result Date: 04/26/2018 CLINICAL DATA:  57 year old male. Follow-up pleural effusions. Subsequent encounter. EXAM: CHEST - LEFT DECUBITUS COMPARISON:  04/25/2018 chest x-ray. FINDINGS: Left-side down decubitus view of the chest reveals left pleural fluid layers in a dependent position. IMPRESSION: Left pleural effusion layers on decubitus view. Electronically Signed   By: Genia Del M.D.   On: 04/26/2018 15:01   US Venous Img Lower Bilateral  Result Date: 04/28/2018 CLINICAL DATA:  57 year old male with bilateral lower extremity edema EXAM: BILATERAL LOWER EXTREMITY VENOUS DOPPLER ULTRASOUND TECHNIQUE: Gray-scale sonography with graded compression, as well as color Doppler and duplex ultrasound were performed to evaluate the lower extremity deep venous systems from the level of the common  femoral vein and including the common femoral, femoral, profunda femoral, popliteal and calf veins including the posterior tibial, peroneal and gastrocnemius veins when visible. The superficial great saphenous vein was also interrogated. Spectral Doppler was utilized to evaluate flow at rest and with distal augmentation maneuvers in the common femoral, femoral and popliteal veins. COMPARISON:  None. FINDINGS: RIGHT LOWER EXTREMITY Common Femoral Vein: No evidence of thrombus. Normal compressibility, respiratory phasicity and response to augmentation. Increased pulsatility of the venous waveform. Saphenofemoral Junction: No evidence of thrombus. Normal compressibility and flow on color Doppler imaging. Profunda Femoral Vein: No evidence of thrombus. Normal compressibility  and flow on color Doppler imaging. Femoral Vein: No evidence of thrombus. Normal compressibility, respiratory phasicity and response to augmentation. Popliteal Vein: No evidence of thrombus. Normal compressibility, respiratory phasicity and response to augmentation. Calf Veins: No evidence of thrombus. Normal compressibility and flow on color Doppler imaging. Superficial Great Saphenous Vein: No evidence of thrombus. Normal compressibility. Venous Reflux:  None. Other Findings:  None. LEFT LOWER EXTREMITY Common Femoral Vein: No evidence of thrombus. Normal compressibility, respiratory phasicity and response to augmentation. Increased pulsatility of the venous waveform. Saphenofemoral Junction: No evidence of thrombus. Normal compressibility and flow on color Doppler imaging. Profunda Femoral Vein: No evidence of thrombus. Normal compressibility and flow on color Doppler imaging. Femoral Vein: No evidence of thrombus. Normal compressibility, respiratory phasicity and response to augmentation. Popliteal Vein: No evidence of thrombus. Normal compressibility, respiratory phasicity and response to augmentation. Calf Veins: No evidence of thrombus. Normal compressibility and flow on color Doppler imaging. Superficial Great Saphenous Vein: No evidence of thrombus. Normal compressibility. Venous Reflux:  None. Other Findings:  None. IMPRESSION: 1. No evidence of deep venous thrombosis in either lower extremity. 2. Increased pulsatility of the venous waveforms bilaterally suggesting elevated right heart pressures. Differential considerations include tricuspid regurgitation, right heart failure, COPD and pulmonary arterial hypertension. Electronically Signed   By: Jacqulynn Cadet M.D.   On: 04/28/2018 16:52   Dg Chest Port 1 View  Result Date: 04/27/2018 CLINICAL DATA:  Status post thoracentesis. EXAM: PORTABLE CHEST 1 VIEW COMPARISON:  Ultrasound 04/27/2018.  Chest x-ray 04/26/2018. FINDINGS: Interval left-sided  thoracentesis. Decrease in left-sided pleural effusion. Left base atelectasis. No pneumothorax. Stable cardiomegaly. IMPRESSION: 1. Interval left thoracentesis with decrease in left-sided pleural effusion. Left base atelectasis. No pneumothorax. 2.  Stable cardiomegaly. Electronically Signed   By: Marcello Moores  Register   On: 04/27/2018 10:42   Dg Chest Port 1 View  Result Date: 04/25/2018 CLINICAL DATA:  57 y/o  M; cough and shortness of breath. EXAM: PORTABLE CHEST 1 VIEW COMPARISON:  04/24/2018 chest radiograph. FINDINGS: Normal cardiac silhouette. Stable small to moderate left pleural effusion and basilar opacity. No new consolidation. No pneumothorax. No acute osseous abnormality is evident. IMPRESSION: Stable small to moderate left pleural effusion and basilar opacity which may represent associated atelectasis or pneumonia. Electronically Signed   By: Kristine Garbe M.D.   On: 04/25/2018 03:59   Dg Chest Port 1 View  Result Date: 04/12/2018 CLINICAL DATA:  Nonproductive cough, fever, chills EXAM: PORTABLE CHEST 1 VIEW COMPARISON:  03/29/2018 FINDINGS: Cardiomegaly with vascular congestion. Small left pleural effusion with left lower lobe atelectasis or infiltrate. Minimal right basilar opacity as well. No overt edema. No effusions scattered that no acute bony abnormality. IMPRESSION: Small left effusion. Bilateral lower lobe atelectasis or infiltrates, left greater than right. Mild cardiomegaly with vascular congestion. Electronically Signed   By: Rolm Baptise M.D.   On: 04/12/2018 11:26  US Thoracentesis Asp Pleural Space W/img Guide  Result Date: 04/27/2018 INDICATION: Left pleural effusion EXAM: ULTRASOUND GUIDED LEFT THORACENTESIS MEDICATIONS: None. COMPLICATIONS: None immediate. PROCEDURE: An ultrasound guided thoracentesis was thoroughly discussed with the patient and questions answered. The benefits, risks, alternatives and complications were also discussed. The patient understands  and wishes to proceed with the procedure. Written consent was obtained. Ultrasound was performed to localize and mark an adequate pocket of fluid in the left chest. The area was then prepped and draped in the normal sterile fashion. 1% Lidocaine was used for local anesthesia. Under ultrasound guidance a 6 Fr Safe-T-Centesis catheter was introduced. Thoracentesis was performed. The catheter was removed and a dressing applied. FINDINGS: A total of approximately 1200 cc of clear yellow fluid was removed. Samples were sent to the laboratory as requested by the clinical team. IMPRESSION: Successful ultrasound guided left thoracentesis yielding 1200 cc of pleural fluid. Electronically Signed   By: Marybelle Killings M.D.   On: 04/27/2018 10:04    Assessment & Plan    Atrial fibrillation-predominately sinus rhythm/sinus bradycardia.  Will remain on metoprolol to remain off of amiodarone.  Pleural effusion-CT showed stable left pleural effusion. Will follow. Trivial pericardial effusion. No tamponade physiology. Will follow with serial echo and /or symptoms.   AAA-4.2 ascending TAA. Not critical at present. Annual monitoring.   FUO-faint monoclonal band on spep. bm biopsy pending.   Signed, Javier Docker Anavey Coombes MD 04/30/2018, 7:36 AM  Pager: [phone]

## 2018-04-30 NOTE — Progress Notes (Addendum)
CCMD called and reported that pt just has a HR at 30 for 3- 6 second a-flutter went to brady then SR with fisrt degree AV block. Notify [facility]. Will continue to monitor.  Update 0926: Doctor Jannifer Franklin ordered to hold metropolol at this time. Will continue to monitor.

## 2018-04-30 NOTE — Progress Notes (Signed)
No fever last night!!  CXR: NNF  CT chest reveals only small-mod L effusion without infiltrate (there is small amount of compressive atx)  Pl fluid cytology: no malignancy  Impression: FUO Recurrent L pleural effusion  Cytology negative  Flow cytometry pending  Plan/rec: Follow-up on pleural fluid flow cytometry, leukemia/lymphoma panel, Quant gold If none of the above studies reveals an explanatory diagnosis, I would recommend thoracic surgery consultation to consider VATS with pleural biopsy.  At this point, I will sign off. Please call if I can be of further assistance.  Merton Border, MD PCCM service Mobile [phone] Pager [phone] 04/30/2018 12:30 PM

## 2018-04-30 NOTE — Progress Notes (Signed)
Subjective Says he is feeling better No cough No fever 48 hrs  Had bone marrow biopsy today  Objective:  VITALS:  Patient Vitals for the past 24 hrs:  BP Temp Temp src Pulse Resp SpO2  04/30/18 1726 100/63 - - 66 - -  04/30/18 1529 (!) 94/59 97.7 F (36.5 C) Oral 64 18 95 %  04/30/18 0936 107/69 98.1 F (36.7 C) Oral 70 18 94 %  04/30/18 0936 - - - - - 94 %  04/30/18 0912 117/73 - - 67 (!) 93 -  04/30/18 0855 115/70 - - 85 (!) 97 -  04/30/18 0846 111/67 - - 69 16 96 %  04/30/18 0841 117/82 - - 69 20 98 %  04/30/18 0839 119/77 - - 68 (!) 21 98 %  04/30/18 0822 120/77 - - - - -  04/30/18 0821 - - - 69 18 98 %  04/30/18 0745 112/72 98.3 F (36.8 C) Oral 62 20 95 %  04/30/18 0548 104/69 99.2 F (37.3 C) Oral 65 - 93 %  04/29/18 2146 132/73 98.2 F (36.8 C) Oral 66 - 99 %  04/29/18 2103 - - - - - 100 %  PHYSICAL EXAM:  General: Alert, cooperative, no distress, appears stated age.  Lungs: b/l air entry, decreased left base Heart: Regular rate and rhythm, no murmur, rub or gallop. Abdomen: Soft, non-tender,not distended. Bowel sounds normal. No masses Extremities: atraumatic, no cyanosis. No edema. No clubbing Skin: No rashes or lesions. Or bruising Lymph: Cervical, supraclavicular normal. Neurologic: Grossly non-focal Pertinent Labs Lab Results CBC    Component Value Date/Time   WBC 6.5 04/30/2018 0422   RBC 3.05 (L) 04/30/2018 0422   HGB 8.8 (L) 04/30/2018 0422   HGB 9.5 (L) 03/23/2018 2341   HCT 27.9 (L) 04/30/2018 0422   HCT 29.5 (L) 03/23/2018 2341   PLT 231 04/30/2018 0422   PLT 156 03/23/2018 2341   MCV 91.5 04/30/2018 0422   MCV 95 03/23/2018 2341   MCV 93 10/23/2014 1719   MCH 28.9 04/30/2018 0422   MCHC 31.5 04/30/2018 0422   RDW 15.7 (H) 04/30/2018 0422   RDW 14.9 03/23/2018 2341   RDW 13.0 10/23/2014 1719   LYMPHSABS 1.9 04/30/2018 0422   LYMPHSABS 2.9 03/23/2018 2341   LYMPHSABS 2.3 12/27/2012 0550   MONOABS 0.7 04/30/2018 0422   MONOABS 0.7  12/27/2012 0550   EOSABS 0.1 04/30/2018 0422   EOSABS 0.1 03/23/2018 2341   EOSABS 0.3 12/27/2012 0550   BASOSABS 0.0 04/30/2018 0422   BASOSABS 0.0 03/23/2018 2341   BASOSABS 0.1 12/27/2012 0550    CMP Latest Ref Rng & Units 04/28/2018 04/27/2018 04/26/2018  Glucose 70 - 99 mg/dL 117(H) 238(H) 246(H)  BUN 6 - 20 mg/dL 15 17 16   Creatinine 0.61 - 1.24 mg/dL 1.52(H) 1.54(H) 1.59(H)  Sodium 135 - 145 mmol/L 141 140 137  Potassium 3.5 - 5.1 mmol/L 3.6 3.5 3.8  Chloride 98 - 111 mmol/L 105 105 108  CO2 22 - 32 mmol/L 27 26 22   Calcium 8.9 - 10.3 mg/dL 8.3(L) 7.9(L) 8.0(L)  Total Protein 6.5 - 8.1 g/dL 6.7 - -  Total Bilirubin 0.3 - 1.2 mg/dL 0.4 - -  Alkaline Phos 38 - 126 U/L 72 - -  AST 15 - 41 U/L 53(H) - -  ALT 0 - 44 U/L 35 - -      Microbiology: Recent Results (from the past 240 hour(s))  Culture, blood (routine x 2)     Status:  None   Collection Time: 04/24/18  7:06 PM  Result Value Ref Range Status   Specimen Description BLOOD LEFT HAND  Final   Special Requests   Final    BOTTLES DRAWN AEROBIC AND ANAEROBIC Blood Culture results may not be optimal due to an excessive volume of blood received in culture bottles   Culture   Final    NO GROWTH 5 DAYS Performed at [facility], [address] [place], [place] [address]    Report Status 04/29/2018 FINAL  Final  Culture, blood (routine x 2)     Status: None   Collection Time: 04/24/18  7:40 PM  Result Value Ref Range Status   Specimen Description BLOOD LEFT ANTECUBITAL  Final   Special Requests   Final    BOTTLES DRAWN AEROBIC AND ANAEROBIC Blood Culture results may not be optimal due to an excessive volume of blood received in culture bottles   Culture   Final    NO GROWTH 5 DAYS Performed at [facility], [address] [place], [place] [address]    Report Status 04/29/2018 FINAL  Final  Blood Culture (routine x 2)     Status: None   Collection Time: 04/25/18  3:20 AM  Result Value Ref Range  Status   Specimen Description BLOOD RIGHT ANTECUBITAL  Final   Special Requests   Final    BOTTLES DRAWN AEROBIC AND ANAEROBIC Blood Culture results may not be optimal due to an excessive volume of blood received in culture bottles   Culture   Final    NO GROWTH 5 DAYS Performed at [facility], [address] [place], [place] [address]    Report Status 04/30/2018 FINAL  Final  Blood Culture (routine x 2)     Status: None   Collection Time: 04/25/18  3:54 AM  Result Value Ref Range Status   Specimen Description BLOOD BLOOD LEFT HAND  Final   Special Requests   Final    BOTTLES DRAWN AEROBIC AND ANAEROBIC Blood Culture results may not be optimal due to an excessive volume of blood received in culture bottles   Culture   Final    NO GROWTH 5 DAYS Performed at [facility], [address], [place], [place] [address]    Report Status 04/30/2018 FINAL  Final  Urine culture     Status: None   Collection Time: 04/25/18  6:57 AM  Result Value Ref Range Status   Specimen Description   Final    URINE, RANDOM Performed at [facility], [address], [place], [place] [address]    Special Requests   Final    NONE Performed at [facility], [address], [place], [place] [address]    Culture   Final    NO GROWTH Performed at [facility], [place] [address], [place], [place] [address]    Report Status 04/26/2018 FINAL  Final  MRSA PCR Screening     Status: None   Collection Time: 04/26/18  1:08 AM  Result Value Ref Range Status   MRSA by PCR NEGATIVE NEGATIVE Final    Comment:        The GeneXpert MRSA Assay (FDA approved for NASAL specimens only), is one component of a comprehensive MRSA colonization surveillance program. It is not intended to diagnose MRSA infection nor to guide or monitor treatment for MRSA infections. Performed at [facility], [address] [place], [place] [address]   Body fluid  culture     Status:  None   Collection Time: 04/27/18  9:50 AM  Result Value Ref Range Status   Specimen Description   Final    PLEURAL Performed at [facility], [address] [place], [place] [address]    Special Requests   Final    NONE Performed at [facility], [address] [place], [place] [address]    Gram Stain   Final    RARE WBC PRESENT, PREDOMINANTLY PMN NO ORGANISMS SEEN    Culture   Final    NO GROWTH 3 DAYS Performed at [facility], [place] [address] [place], [place] [address]    Report Status 04/30/2018 FINAL  Final  Fungus Culture With Stain     Status: None (Preliminary result)   Collection Time: 04/27/18  9:50 AM  Result Value Ref Range Status   Fungus Stain Final report  Final    Comment: (NOTE) Performed At: [facility] [place], [place] [id number] Rush Farmer MD [id number]    Fungus (Mycology) Culture PENDING  Incomplete   Fungal Source PLEURAL  Final    Comment: Performed at [facility], [address] [place], [place] [address]  Fungus Culture Result     Status: None   Collection Time: 04/27/18  9:50 AM  Result Value Ref Range Status   Result 1 Comment  Final    Comment: (NOTE) KOH/Calcofluor preparation:  no fungus observed. Performed At: [facility], [place] [id number] Rush Farmer MD [id number]   CULTURE, BLOOD (ROUTINE X 2) w Reflex to ID Panel     Status: None (Preliminary result)   Collection Time: 04/29/18 10:58 AM  Result Value Ref Range Status   Specimen Description BLOOD BLOOD RIGHT HAND  Final   Special Requests   Final    BOTTLES DRAWN AEROBIC AND ANAEROBIC Blood Culture adequate volume   Culture   Final    NO GROWTH < 24 HOURS Performed at [facility], [address], [place], [place] [address]    Report Status PENDING  Incomplete  CULTURE, BLOOD (ROUTINE X 2) w Reflex to ID Panel     Status: None (Preliminary result)   Collection  Time: 04/29/18 11:05 AM  Result Value Ref Range Status   Specimen Description BLOOD BLOOD LEFT HAND  Final   Special Requests   Final    BOTTLES DRAWN AEROBIC AND ANAEROBIC Blood Culture adequate volume   Culture   Final    NO GROWTH < 24 HOURS Performed at [facility], [address], [place], [place] [address]    Report Status PENDING  Incomplete   IMAGING RESULTS: ? I  Impression/Recommendation  57 y.o. malewith history of HIV, CHF, chronic kidney disease(FSGN), schizophrenia  is admitted with cough, fever . Pt has had recurrent hospitalization for the same complaint.  ?Recurrent visits to ED and hospitalization with constellation of symptoms like cough, Fever SOB, headache, anxiety Not clear what the underlying pathology is?  He has following positive  findings Fever- none for the past 48 hrs  Anemia since Sept 2019 High ESR > 100 Pericardial effusion  pleural effusion Serum protein electrophoresis shows low M spike 0.4 indeterminate quantiferon gold S/pbone marrow biopsy Neg ANA, ANCA, normal ferritin, ACE , LDH, CK, legionella neg, blood cultures neg D.D ? autoimmuneVS malignancy VS infection VS drug induced ?Sarcoid ?TRAPS ? FMF ( no genetic predisposition) Need to r/o body cavity lymphoma  sarcoid TB- afb pleural fluid culture  ( pleural fluid analysis  is not suggestive of pleural TB (await AFB smear result) Appreciate pulmonologist recommendation- adenosine de-aminase added-   parvovirus, CMV, EBV, toxo, RPR, Borrelia Burgdoferi pending Will check with lab for other tests like HHV8,  CT abdomen -no abscess  Left pleural effusion with atelectasis- He does not have pneumonia ( was treated as one in the last admission and he responded to steroids rather than to antibiotics last admission)  Pleurocentesis- sent for bacterial, afb    Fluctuating renal function  Adrenal adenoma    Ascending thoracic Aortic aneurysm 4.3 cm?   Afib was on  amiodarone-  discontinued _ HIV- well controlled on Triumeq- VL < 20 and cd4 >1000 Pt has been on Triumeq since 2016 and before that Epzicom-  __________________________________________________ Discussed with patient and care team

## 2018-04-30 NOTE — Plan of Care (Signed)
  Problem: Health Behavior/Discharge Planning: Goal: Ability to manage health-related needs will improve Outcome: Progressing   Problem: Coping: Goal: Level of anxiety will decrease Outcome: Progressing   Problem: Respiratory: Goal: Ability to maintain adequate ventilation will improve Outcome: Progressing

## 2018-04-30 NOTE — Consult Note (Signed)
MEDICATION-RELATED CONSULT NOTE   IR Procedure Consult - Anticoagulant/Antiplatelet PTA/Inpatient Med List Review by Pharmacist    Procedure: CT BM asp and core bx    Completed: 11/01 0900  Post-Procedural bleeding risk per IR MD assessment:  Standard  Antithrombotic medications on inpatient or PTA profile prior to procedure:   Enoxaparin 40 mg subcutaneous every 24 hours    Recommended restart time per IR Post-Procedure Guidelines:  Day +1 (next AM)   Other considerations:      Plan:    Ordered Enoxaparin 40 mg subQ every 24 hours to start 11/02 0900

## 2018-04-30 NOTE — Procedures (Signed)
HIV, FUO, MGUS  S/p CT BM ASP AND CORE BX  No comp Stable EBL min Path and cx pending Full report in pacs

## 2018-05-01 LAB — QUANTIFERON-TB GOLD PLUS: QuantiFERON-TB Gold Plus: NEGATIVE

## 2018-05-01 LAB — GLUCOSE, CAPILLARY
GLUCOSE-CAPILLARY: 270 mg/dL — AB (ref 70–99)
Glucose-Capillary: 260 mg/dL — ABNORMAL HIGH (ref 70–99)
Glucose-Capillary: 257 mg/dL — ABNORMAL HIGH (ref 70–99)

## 2018-05-01 LAB — FUNGITELL, SERUM: Fungitell Result: <31 pg/mL (ref <80)

## 2018-05-01 LAB — CMV DNA, QUANTITATIVE, PCR
CMV DNA Quant: NEGATIVE IU/mL
LOG10 CMV QN DNA PL: UNDETERMINED {Log_IU}/mL

## 2018-05-01 LAB — QUANTIFERON-TB GOLD PLUS (RQFGPL)
QUANTIFERON NIL VALUE: 0.05 [IU]/mL
QuantiFERON Mitogen Value: 2.62 IU/mL
QuantiFERON TB1 Ag Value: 0.06 IU/mL
QuantiFERON TB2 Ag Value: 0.06 IU/mL

## 2018-05-01 LAB — TOXOPLASMA ANTIBODIES- IGG AND  IGM

## 2018-05-01 LAB — ACID FAST SMEAR (AFB)

## 2018-05-01 LAB — ACID FAST SMEAR (AFB, MYCOBACTERIA): Acid Fast Smear: NEGATIVE

## 2018-05-01 LAB — RPR: RPR: NONREACTIVE

## 2018-05-01 MED ORDER — DIAZEPAM 5 MG PO TABS
5.0000 mg | ORAL_TABLET | Freq: Three times a day (TID) | ORAL | Status: DC | PRN
  Administered 2018-05-01 – 2018-05-02 (×3): 5 mg via ORAL
  Filled 2018-05-01 (×4): qty 1

## 2018-05-01 NOTE — Progress Notes (Signed)
   05/01/18 1540  Clinical Encounter Type  Visited With Patient  Visit Type Follow-up  Recommendations follow up later today or tomorrow  [place] followed up with patient who reported that he was feeling better than he had during last conversation.  He spoke of medications and impact on his condition.  Chaplain maintained a calming presence, engaging in energetic prayer and emotional support.  Patient would appreciate ongoing follow up either later today or tomorrow.  If this chaplain is unable to return later today, request will be passed on to [facility] on-call chaplain.

## 2018-05-01 NOTE — Progress Notes (Signed)
Danny Cook at [place] NAME: Danny Cook    MR#:  [id number]  DATE OF BIRTH:  1960/11/07  SUBJECTIVE:   Patient denies any complaints, sitting up and eating breakfast.  Had a fever of 102.7 yesterday evening again.  Seen by hematology oncology and the plan for IR guided bone marrow biopsy - done. No fever now.  REVIEW OF SYSTEMS:    Review of Systems  Constitutional: Positive for fever. Negative for chills.  HENT: Negative for congestion and tinnitus.   Eyes: Negative for blurred vision and double vision.  Respiratory: Negative for cough, shortness of breath and wheezing.   Cardiovascular: Negative for chest pain, orthopnea and PND.  Gastrointestinal: Negative for abdominal pain, diarrhea, nausea and vomiting.  Genitourinary: Negative for dysuria and hematuria.  Neurological: Negative for dizziness, sensory change and focal weakness.  All other systems reviewed and are negative.   Nutrition: Heart healthy/Carb modified Tolerating Diet: yes Tolerating PT: Ambulatory  DRUG ALLERGIES:   Allergies  Allergen Reactions  . Trazodone And Nefazodone   . Viread [Tenofovir Disoproxil]     Damages kidneys  . Etodolac Rash    VITALS:  Blood pressure 123/67, pulse 64, temperature 98.2 F (36.8 C), temperature source Oral, resp. rate 19, height _0  (1.702 m), weight 93 kg, SpO2 95 %.  PHYSICAL EXAMINATION:   Physical Exam  GENERAL:  57 y.o.-year-old obese patient sitting up in chair eating breakfast in no acute distress.  EYES: Pupils equal, round, reactive to light and accommodation. No scleral icterus. Extraocular muscles intact.  HEENT: Head atraumatic, normocephalic. Oropharynx and nasopharynx clear.  NECK:  Supple, no jugular venous distention. No thyroid enlargement, no tenderness.  LUNGS: Normal breath sounds bilaterally, no wheezing, rales, rhonchi. No use of accessory muscles of respiration.  CARDIOVASCULAR: S1, S2  normal. No murmurs, rubs, or gallops.  ABDOMEN: Soft, nontender, nondistended. Bowel sounds present. No organomegaly or mass.  EXTREMITIES: No cyanosis, clubbing or edema b/l.    NEUROLOGIC: Cranial nerves II through XII are intact. No focal Motor or sensory deficits b/l. Globally weak.  PSYCHIATRIC: The patient is alert and oriented x 3.  SKIN: No obvious rash, lesion, or ulcer.    LABORATORY PANEL:   CBC Recent Labs  Lab 04/30/18 0422  WBC 6.5  HGB 8.8*  HCT 27.9*  PLT 231   ------------------------------------------------------------------------------------------------------------------  Chemistries  Recent Labs  Lab 04/26/18 0423  04/28/18 0634 04/28/18 1718  NA  --    < > 141  --   K  --    < > 3.6  --   CL  --    < > 105  --   CO2  --    < > 27  --   GLUCOSE  --    < > 117*  --   BUN  --    < > 15  --   CREATININE  --    < > 1.52*  --   CALCIUM  --    < > 8.3*  --   MG 2.1  --   --   --   AST  --   --   --  53*  ALT  --   --   --  35  ALKPHOS  --   --   --  72  BILITOT  --   --   --  0.4   < > = values in this interval not displayed.   ------------------------------------------------------------------------------------------------------------------  Cardiac Enzymes Recent Labs  Lab 04/26/18 0423  TROPONINI 0.03*   ------------------------------------------------------------------------------------------------------------------  RADIOLOGY:  Ct Abdomen Pelvis Wo Contrast  Result Date: 04/30/2018 CLINICAL DATA:  Abdominal infections. Persistent fever. Generalized abdominal pain. EXAM: CT ABDOMEN AND PELVIS WITHOUT CONTRAST TECHNIQUE: Multidetector CT imaging of the abdomen and pelvis was performed following the standard protocol without IV contrast. COMPARISON:  02/27/2018, 06/21/2016 and 05/23/2015 FINDINGS: Lower chest: Small left pleural effusion with associated compressive atelectasis which is new. Tiny amount right pleural fluid. Minimal calcified plaque  over the left anterior descending coronary artery. Small pericardial effusion which is new. Hepatobiliary: Gallbladder, liver and biliary tree are normal. Pancreas: Normal. Spleen: Normal. Adrenals/Urinary Tract: Right adrenal gland is normal. 3.7 cm left adrenal mass compatible with an adenoma and unchanged. Kidneys are normal in size. Stable 1.1 cm stone over the mid pole right kidney. No left renal stones. No hydronephrosis. Ureters and bladder are normal. Stomach/Bowel: Stomach and small bowel are normal. Appendix is normal. Colon is normal. Vascular/Lymphatic: Minimal calcified plaque involving the abdominal aorta. No adenopathy. Reproductive: Normal. Other: No free fluid or focal inflammatory change. Musculoskeletal: Degenerative changes of the spine and hips. IMPRESSION: No acute findings in the abdomen/pelvis. Stable nonobstructing 1.1 cm right renal stone. New small left effusion with associated compressive atelectasis and tiny amount right pleural fluid. New small pericardial effusion. Stable 3.7 cm left adrenal adenoma. Aortic Atherosclerosis (ICD10-I70.0). Mild atherosclerotic coronary artery disease. Electronically Signed   By: Marin Olp M.D.   On: 04/30/2018 02:26   Ct Bone Marrow Biopsy & Aspiration  Result Date: 04/30/2018 INDICATION: HIV, fever unknown origin, concern for monoclonal gammopathy of uncertain significance EXAM: CT GUIDED RIGHT ILIAC BONE MARROW ASPIRATION AND CORE BIOPSY Date:  04/30/2018 04/30/2018 9:12 am Radiologist:  M. Daryll Brod, MD Guidance:  CT FLUOROSCOPY TIME:  Fluoroscopy Time: None. MEDICATIONS: 1% lidocaine local ANESTHESIA/SEDATION: 3.0 mg IV Versed; 100 mcg IV Fentanyl Moderate Sedation Time:  14 minutes The patient was continuously monitored during the procedure by the interventional radiology nurse under my direct supervision. CONTRAST:  None. COMPLICATIONS: None PROCEDURE: Informed consent was obtained from the patient following explanation of the procedure,  risks, benefits and alternatives. The patient understands, agrees and consents for the procedure. All questions were addressed. A time out was performed. The patient was positioned prone and non-contrast localization CT was performed of the pelvis to demonstrate the iliac marrow spaces. Maximal barrier sterile technique utilized including caps, mask, sterile gowns, sterile gloves, large sterile drape, hand hygiene, and Betadine prep. Under sterile conditions and local anesthesia, an 11 gauge coaxial bone biopsy needle was advanced into the right iliac marrow space. Needle position was confirmed with CT imaging. Initially, bone marrow aspiration was performed. Next, the 11 gauge outer cannula was utilized to obtain a right iliac bone marrow core biopsy. Needle was removed. Hemostasis was obtained with compression. The patient tolerated the procedure well. Samples were prepared with the cytotechnologist. No immediate complications. IMPRESSION: CT guided right iliac bone marrow aspiration and core biopsy. Electronically Signed   By: Jerilynn Mages.  Shick M.D.   On: 04/30/2018 09:17     ASSESSMENT AND PLAN:   57 year old male with past medical history of schizophrenia, HIV, obstructive sleep apnea, diabetes, hypertension, CKD stage IV, chronic anemia, history of Kaposi's sarcoma who presents to the hospital due to suspected sepsis.  1.  Sepsis-this was the working diagnosis on admission given patient's fever, tachycardia, elevated white cell count and suspected to have pneumonia.  Patient has been  hospitalized multiple times for similar reasons and improved with IV steroids and IV antibiotics. - Seen by infectious disease and they do not think that the patient has underlying sepsis or pneumonia but likely some kind of an autoimmune process which is steroid responsive.  Sepsis has now been ruled out.  Patient's cultures remain negative and patient has been tapered off antibiotics. - AFB work ups sent.  2.  Pneumonia-this  was suspected on admission and patient has had repeated admissions for similar complaints and similar reasons and has responded well to antibiotics. -Pulmonary was consulted and they do not think that the patient has underlying pneumonia.  Patient was noted to have a left-sided pleural effusion and is status post thoracentesis but the pleural fluid analysis does not seem infectious in nature. - Pulmonary is possibly considering primary tuberculosis and patient had repeated QuantiFERON testing results of which is still pending.  Patient's pleural fluid cytometry is also still pending.  CT scan of the chest obtained today which shows small pleural effusion but no evidence of left lower lobe infiltrate.  Pulmonary to possibly discuss with cardiothoracic surgery for pleural biopsy if the bone marrow biopsy and further serologic testing are not diagnostic.  3.  Steroid responsive systemic illness- etiology unclear presently. - Seen by pulmonary, rheumatology, ID, Nephrology and also Hem/Onc and appreciate input.  Patient's case was discussed with case conference today with all specialists yesterday.  Rheumatology is possibly considering amyloidosis as patient had a mild monoclonal gammopathy with some mild anemia.  - Seen by Hem/onc but their suspicion for Amyloid is low.  Bone Marrow biopsy is done . Serologic testing as per Onc has been done.   4.  Diabetes type 2 without complication-continue sliding scale insulin - BS stable.   5.  CKD stage IV-patient's creatinine is at baseline.  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 -Cr at baseline and will monitor.   6.  History of migraines-continue Fioricet.  7.  Anxiety/depression -continue Klonopin, Effexor.   8.  History of bipolar disorder-continue Depakote.  9. GERD - cont. Protonix.    10. Hx of A. Fib -rate controlled, patient was on amiodarone but it was discontinued given possible underlying  autoimmune process and leading to pleural effusion. -Continue metoprolol, appreciate cardiology input. Cont. ASA   All the records are reviewed and case discussed with Care Management/Social Worker. Management plans discussed with the patient, family and they are in agreement.  CODE STATUS: Full code  DVT Prophylaxis: Lovenox  TOTAL TIME TAKING CARE OF THIS PATIENT: 35 minutes.   POSSIBLE D/C IN 2-3 DAYS, DEPENDING ON CLINICAL CONDITION.   Vaughan Basta M.D on 05/01/2018 at 2:22 PM  Between 7am to 6pm - Pager - [phone]  After 6pm go to [email or web address] - [facility]  Office  [phone]  CC: Primary care physician; Katheren Shams

## 2018-05-01 NOTE — Progress Notes (Signed)
Danny Cook at [place] NAME: Danny Cook    MR#:  [id number]  DATE OF BIRTH:  1960/11/07  SUBJECTIVE:   Patient denies any complaints, sitting up and eating breakfast.  Had a fever of 102.7 yesterday evening again.  Seen by hematology oncology and the plan for IR guided bone marrow biopsy - done. No fever now.  REVIEW OF SYSTEMS:    Review of Systems  Constitutional: Positive for fever. Negative for chills.  HENT: Negative for congestion and tinnitus.   Eyes: Negative for blurred vision and double vision.  Respiratory: Negative for cough, shortness of breath and wheezing.   Cardiovascular: Negative for chest pain, orthopnea and PND.  Gastrointestinal: Negative for abdominal pain, diarrhea, nausea and vomiting.  Genitourinary: Negative for dysuria and hematuria.  Neurological: Negative for dizziness, sensory change and focal weakness.  All other systems reviewed and are negative.   Nutrition: Heart healthy/Carb modified Tolerating Diet: yes Tolerating PT: Ambulatory  DRUG ALLERGIES:   Allergies  Allergen Reactions  . Trazodone And Nefazodone   . Viread [Tenofovir Disoproxil]     Damages kidneys  . Etodolac Rash    VITALS:  Blood pressure 123/67, pulse 64, temperature 98.2 F (36.8 C), temperature source Oral, resp. rate 19, height _0  (1.702 m), weight 93 kg, SpO2 95 %.  PHYSICAL EXAMINATION:   Physical Exam  GENERAL:  57 y.o.-year-old obese patient sitting up in chair eating breakfast in no acute distress.  EYES: Pupils equal, round, reactive to light and accommodation. No scleral icterus. Extraocular muscles intact.  HEENT: Head atraumatic, normocephalic. Oropharynx and nasopharynx clear.  NECK:  Supple, no jugular venous distention. No thyroid enlargement, no tenderness.  LUNGS: Normal breath sounds bilaterally, no wheezing, rales, rhonchi. No use of accessory muscles of respiration.  CARDIOVASCULAR: S1, S2  normal. No murmurs, rubs, or gallops.  ABDOMEN: Soft, nontender, nondistended. Bowel sounds present. No organomegaly or mass.  EXTREMITIES: No cyanosis, clubbing or edema b/l.    NEUROLOGIC: Cranial nerves II through XII are intact. No focal Motor or sensory deficits b/l. Globally weak.  PSYCHIATRIC: The patient is alert and oriented x 3.  SKIN: No obvious rash, lesion, or ulcer.    LABORATORY PANEL:   CBC Recent Labs  Lab 04/30/18 0422  WBC 6.5  HGB 8.8*  HCT 27.9*  PLT 231   ------------------------------------------------------------------------------------------------------------------  Chemistries  Recent Labs  Lab 04/26/18 0423  04/28/18 0634 04/28/18 1718  NA  --    < > 141  --   K  --    < > 3.6  --   CL  --    < > 105  --   CO2  --    < > 27  --   GLUCOSE  --    < > 117*  --   BUN  --    < > 15  --   CREATININE  --    < > 1.52*  --   CALCIUM  --    < > 8.3*  --   MG 2.1  --   --   --   AST  --   --   --  53*  ALT  --   --   --  35  ALKPHOS  --   --   --  72  BILITOT  --   --   --  0.4   < > = values in this interval not displayed.   ------------------------------------------------------------------------------------------------------------------  Cardiac Enzymes Recent Labs  Lab 04/26/18 0423  TROPONINI 0.03*   ------------------------------------------------------------------------------------------------------------------  RADIOLOGY:  Ct Abdomen Pelvis Wo Contrast  Result Date: 04/30/2018 CLINICAL DATA:  Abdominal infections. Persistent fever. Generalized abdominal pain. EXAM: CT ABDOMEN AND PELVIS WITHOUT CONTRAST TECHNIQUE: Multidetector CT imaging of the abdomen and pelvis was performed following the standard protocol without IV contrast. COMPARISON:  02/27/2018, 06/21/2016 and 05/23/2015 FINDINGS: Lower chest: Small left pleural effusion with associated compressive atelectasis which is new. Tiny amount right pleural fluid. Minimal calcified plaque  over the left anterior descending coronary artery. Small pericardial effusion which is new. Hepatobiliary: Gallbladder, liver and biliary tree are normal. Pancreas: Normal. Spleen: Normal. Adrenals/Urinary Tract: Right adrenal gland is normal. 3.7 cm left adrenal mass compatible with an adenoma and unchanged. Kidneys are normal in size. Stable 1.1 cm stone over the mid pole right kidney. No left renal stones. No hydronephrosis. Ureters and bladder are normal. Stomach/Bowel: Stomach and small bowel are normal. Appendix is normal. Colon is normal. Vascular/Lymphatic: Minimal calcified plaque involving the abdominal aorta. No adenopathy. Reproductive: Normal. Other: No free fluid or focal inflammatory change. Musculoskeletal: Degenerative changes of the spine and hips. IMPRESSION: No acute findings in the abdomen/pelvis. Stable nonobstructing 1.1 cm right renal stone. New small left effusion with associated compressive atelectasis and tiny amount right pleural fluid. New small pericardial effusion. Stable 3.7 cm left adrenal adenoma. Aortic Atherosclerosis (ICD10-I70.0). Mild atherosclerotic coronary artery disease. Electronically Signed   By: Marin Olp M.D.   On: 04/30/2018 02:26   Ct Bone Marrow Biopsy & Aspiration  Result Date: 04/30/2018 INDICATION: HIV, fever unknown origin, concern for monoclonal gammopathy of uncertain significance EXAM: CT GUIDED RIGHT ILIAC BONE MARROW ASPIRATION AND CORE BIOPSY Date:  04/30/2018 04/30/2018 9:12 am Radiologist:  M. Daryll Brod, MD Guidance:  CT FLUOROSCOPY TIME:  Fluoroscopy Time: None. MEDICATIONS: 1% lidocaine local ANESTHESIA/SEDATION: 3.0 mg IV Versed; 100 mcg IV Fentanyl Moderate Sedation Time:  14 minutes The patient was continuously monitored during the procedure by the interventional radiology nurse under my direct supervision. CONTRAST:  None. COMPLICATIONS: None PROCEDURE: Informed consent was obtained from the patient following explanation of the procedure,  risks, benefits and alternatives. The patient understands, agrees and consents for the procedure. All questions were addressed. A time out was performed. The patient was positioned prone and non-contrast localization CT was performed of the pelvis to demonstrate the iliac marrow spaces. Maximal barrier sterile technique utilized including caps, mask, sterile gowns, sterile gloves, large sterile drape, hand hygiene, and Betadine prep. Under sterile conditions and local anesthesia, an 11 gauge coaxial bone biopsy needle was advanced into the right iliac marrow space. Needle position was confirmed with CT imaging. Initially, bone marrow aspiration was performed. Next, the 11 gauge outer cannula was utilized to obtain a right iliac bone marrow core biopsy. Needle was removed. Hemostasis was obtained with compression. The patient tolerated the procedure well. Samples were prepared with the cytotechnologist. No immediate complications. IMPRESSION: CT guided right iliac bone marrow aspiration and core biopsy. Electronically Signed   By: Jerilynn Mages.  Shick M.D.   On: 04/30/2018 09:17     ASSESSMENT AND PLAN:   57 year old male with past medical history of schizophrenia, HIV, obstructive sleep apnea, diabetes, hypertension, CKD stage IV, chronic anemia, history of Kaposi's sarcoma who presents to the hospital due to suspected sepsis.  1.  Sepsis-this was the working diagnosis on admission given patient's fever, tachycardia, elevated white cell count and suspected to have pneumonia.  Patient has been  hospitalized multiple times for similar reasons and improved with IV steroids and IV antibiotics. - Seen by infectious disease and they do not think that the patient has underlying sepsis or pneumonia but likely some kind of an autoimmune process which is steroid responsive.  Sepsis has now been ruled out.  Patient's cultures remain negative and patient has been tapered off antibiotics. - AFB work ups sent.  2.  Pneumonia-this  was suspected on admission and patient has had repeated admissions for similar complaints and similar reasons and has responded well to antibiotics. -Pulmonary was consulted and they do not think that the patient has underlying pneumonia.  Patient was noted to have a left-sided pleural effusion and is status post thoracentesis but the pleural fluid analysis does not seem infectious in nature. - Pulmonary is possibly considering primary tuberculosis and patient had repeated QuantiFERON testing results of which is still pending.  Patient's pleural fluid cytometry is also still pending.  CT scan of the chest obtained today which shows small pleural effusion but no evidence of left lower lobe infiltrate.  Pulmonary to possibly discuss with cardiothoracic surgery for pleural biopsy if the bone marrow biopsy and further serologic testing are not diagnostic.  3.  Steroid responsive systemic illness- etiology unclear presently. - Seen by pulmonary, rheumatology, ID, Nephrology and also Hem/Onc and appreciate input.  Patient's case was discussed with case conference today with all specialists yesterday.  Rheumatology is possibly considering amyloidosis as patient had a mild monoclonal gammopathy with some mild anemia.  - Seen by Hem/onc but their suspicion for Amyloid is low.  Bone Marrow biopsy is done . Serologic testing as per Onc has been done.   4.  Diabetes type 2 without complication-continue sliding scale insulin - BS stable.   5.  CKD stage IV-patient's creatinine is at baseline.  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 -Cr at baseline and will monitor.   6.  History of migraines-continue Fioricet.  7.  Anxiety/depression -continue Klonopin, Effexor.   8.  History of bipolar disorder-continue Depakote.  9. GERD - cont. Protonix.    10. Hx of A. Fib -rate controlled, patient was on amiodarone but it was discontinued given possible underlying  autoimmune process and leading to pleural effusion. -Continue metoprolol, appreciate cardiology input. Cont. ASA   All the records are reviewed and case discussed with Care Management/Social Worker. Management plans discussed with the patient, family and they are in agreement.  CODE STATUS: Full code  DVT Prophylaxis: Lovenox  TOTAL TIME TAKING CARE OF THIS PATIENT: 35 minutes.   POSSIBLE D/C IN 2-3 DAYS, DEPENDING ON CLINICAL CONDITION.   Vaughan Basta M.D on 05/01/2018 at 2:17 PM  Between 7am to 6pm - Pager - [phone]  After 6pm go to [email or web address] - [facility]  Office  [phone]  CC: Primary care physician; Katheren Shams

## 2018-05-01 NOTE — Progress Notes (Signed)
   05/01/18 0930  Clinical Encounter Type  Visited With Patient  Visit Type Initial (order requisition)  Referral From Nurse  Consult/Referral To Chaplain  Recommendations follow up later today  Spiritual Encounters  Spiritual Needs Emotional   Chaplain responded to OR for patient support.  Patient self-reported anxiety and difficulty in breathing/catching his breath.  He stated that 'having someone to talk to might help.'  Chaplain utilized active and reflective listening as patient reviewed experience of difficulty breathing and impact of being in hospital when patient would prefer to be at home.  Chaplain offered to facilitate a relaxation meditation which patient shifted focus to expressing hope that getting out to walk with staff would be helpful.  Patient did agree to try a breathing exercise which chaplain demonstrated.  Patient expressed desire to rest but is open to chaplain following up again later today.

## 2018-05-01 NOTE — Progress Notes (Signed)
CCMD called to notify patient has had more frequent episodes of sinus brady in the 66s. Going back and forth between flutter and sinus rhythm/sinus brady. Patient asymptomatic, sitting up in the chair. Dr. Ubaldo Glassing and Dr. Anselm Jungling notified. Will continue to monitor patient.

## 2018-05-01 NOTE — Progress Notes (Signed)
   05/01/18 1850  Clinical Encounter Type  Visited With Patient  Visit Type Follow-up  Recommendations follow up Sunday 11/3   Chaplain followed up with patient who spoke of upcoming visit from friend.  Patient also shared shift of focus from wanting to go home to wanting to remain until care team has identified what brought him into the hospital in the first place.  Chaplain maintained a calming presence, offering emotional support.  Patient asked if a chaplain would visit with him tomorrow.  Chaplain stated that she would ask tomorrow's chaplain to follow up with patient.

## 2018-05-01 NOTE — Progress Notes (Signed)
CCMD reported pt have a 2.42 paused. Pt only complaints SOB but sat at 95%. VSS. Pt has a schedule metropolol at 2200. Notify [employer].Will continue to monitor.  Update 2225: Doctor Jannifer Franklin states to hold metropolol at this time. Will continue to monitor.

## 2018-05-02 LAB — COMPREHENSIVE METABOLIC PANEL
ALK PHOS: 106 U/L (ref 38–126)
ALT: 42 U/L (ref 0–44)
ANION GAP: 11 (ref 5–15)
AST: 19 U/L (ref 15–41)
Albumin: 2.3 g/dL — ABNORMAL LOW (ref 3.5–5.0)
BILIRUBIN TOTAL: 0.4 mg/dL (ref 0.3–1.2)
BUN: 21 mg/dL — AB (ref 6–20)
CO2: 26 mmol/L (ref 22–32)
Calcium: 8.7 mg/dL — ABNORMAL LOW (ref 8.9–10.3)
Chloride: 101 mmol/L (ref 98–111)
Creatinine, Ser: 1.21 mg/dL (ref 0.61–1.24)
Glucose, Bld: 258 mg/dL — ABNORMAL HIGH (ref 70–99)
Potassium: 3.8 mmol/L (ref 3.5–5.1)
Sodium: 138 mmol/L (ref 135–145)
TOTAL PROTEIN: 6.2 g/dL — AB (ref 6.5–8.1)

## 2018-05-02 LAB — EPSTEIN BARR VRS(EBV DNA BY PCR)
EBV DNA QN by PCR: 1335 copies/mL
log10 EBV DNA Qn PCR: 3.125 log10 copy/mL

## 2018-05-02 LAB — ADENOSIDE DEAMINASE, PLEURAL FL

## 2018-05-02 LAB — CBC
HCT: 27.3 % — ABNORMAL LOW (ref 39.0–52.0)
HEMOGLOBIN: 8.7 g/dL — AB (ref 13.0–17.0)
MCH: 28.9 pg (ref 26.0–34.0)
MCHC: 31.9 g/dL (ref 30.0–36.0)
MCV: 90.7 fL (ref 80.0–100.0)
Platelets: 293 10*3/uL (ref 150–400)
RBC: 3.01 MIL/uL — ABNORMAL LOW (ref 4.22–5.81)
RDW: 15.4 % (ref 11.5–15.5)
WBC: 5.1 10*3/uL (ref 4.0–10.5)
nRBC: 0 % (ref 0.0–0.2)

## 2018-05-02 LAB — ACID FAST SMEAR (AFB)

## 2018-05-02 LAB — ACID FAST SMEAR (AFB, MYCOBACTERIA): Acid Fast Smear: NEGATIVE

## 2018-05-02 LAB — GLUCOSE, CAPILLARY
GLUCOSE-CAPILLARY: 229 mg/dL — AB (ref 70–99)
GLUCOSE-CAPILLARY: 241 mg/dL — AB (ref 70–99)
GLUCOSE-CAPILLARY: 278 mg/dL — AB (ref 70–99)
Glucose-Capillary: 276 mg/dL — ABNORMAL HIGH (ref 70–99)

## 2018-05-02 LAB — ACID FAST CULTURE WITH REFLEXED SENSITIVITIES (MYCOBACTERIA)

## 2018-05-02 MED ORDER — INSULIN GLARGINE 100 UNIT/ML ~~LOC~~ SOLN
10.0000 [IU] | Freq: Every day | SUBCUTANEOUS | Status: DC
  Administered 2018-05-02: 10 [IU] via SUBCUTANEOUS
  Filled 2018-05-02 (×2): qty 0.1

## 2018-05-02 MED ORDER — GABAPENTIN 100 MG PO CAPS
100.0000 mg | ORAL_CAPSULE | Freq: Three times a day (TID) | ORAL | Status: DC
  Administered 2018-05-02 – 2018-05-03 (×3): 100 mg via ORAL
  Filled 2018-05-02 (×3): qty 1

## 2018-05-02 MED ORDER — DIAZEPAM 5 MG PO TABS
10.0000 mg | ORAL_TABLET | Freq: Three times a day (TID) | ORAL | Status: DC | PRN
  Administered 2018-05-02 – 2018-05-03 (×2): 10 mg via ORAL
  Filled 2018-05-02 (×2): qty 2

## 2018-05-02 NOTE — Plan of Care (Signed)
  Problem: Health Behavior/Discharge Planning: Goal: Ability to manage health-related needs will improve Outcome: Progressing   Problem: Coping: Goal: Level of anxiety will decrease Outcome: Progressing   

## 2018-05-02 NOTE — Progress Notes (Signed)
Chaplain follow up with patient from previous chaplain. Chaplain offered alternative thoughts and encouragement. He is wondering why God is allowing him to go through this stay. Chaplain offered positive considerations. MD came in and gave positive news for the patient to consider.  Chaplain prayed with patient for news that will allow home to go home    05/02/18 1400  Clinical Encounter Type  Visited With Patient  Visit Type Follow-up  Consult/Referral To Chaplain

## 2018-05-03 ENCOUNTER — Inpatient Hospital Stay: Payer: Medicare Other

## 2018-05-03 LAB — GLUCOSE, CAPILLARY
GLUCOSE-CAPILLARY: 185 mg/dL — AB (ref 70–99)
GLUCOSE-CAPILLARY: 167 mg/dL — AB (ref 70–99)
GLUCOSE-CAPILLARY: 326 mg/dL — AB (ref 70–99)
Glucose-Capillary: 203 mg/dL — ABNORMAL HIGH (ref 70–99)
Glucose-Capillary: 347 mg/dL — ABNORMAL HIGH (ref 70–99)
Glucose-Capillary: 283 mg/dL — ABNORMAL HIGH (ref 70–99)

## 2018-05-03 MED ORDER — DIAZEPAM 2 MG PO TABS: 2.0000 mg | ORAL_TABLET | Freq: Three times a day (TID) | ORAL | 0 refills | Status: DC | PRN

## 2018-05-03 MED ORDER — GABAPENTIN 100 MG PO CAPS: 100.0000 mg | ORAL_CAPSULE | Freq: Three times a day (TID) | ORAL | 0 refills | Status: DC

## 2018-05-03 MED ORDER — METOPROLOL TARTRATE 25 MG PO TABS: 12.5000 mg | ORAL_TABLET | Freq: Two times a day (BID) | ORAL | Status: DC

## 2018-05-03 MED ORDER — ACETAMINOPHEN 325 MG PO TABS: 650.0000 mg | ORAL_TABLET | Freq: Four times a day (QID) | ORAL | 0 refills | Status: DC | PRN

## 2018-05-03 MED ORDER — LOSARTAN POTASSIUM 25 MG PO TABS: 25.0000 mg | ORAL_TABLET | Freq: Every day | ORAL | 0 refills | Status: DC

## 2018-05-03 MED ORDER — INSULIN GLARGINE 100 UNIT/ML ~~LOC~~ SOLN
15.0000 [IU] | Freq: Every day | SUBCUTANEOUS | Status: DC
  Filled 2018-05-03: qty 0.15

## 2018-05-03 MED ORDER — INSULIN ASPART 100 UNIT/ML ~~LOC~~ SOLN
5.0000 [IU] | Freq: Three times a day (TID) | SUBCUTANEOUS | Status: DC
  Administered 2018-05-03 (×2): 5 [IU] via SUBCUTANEOUS
  Filled 2018-05-03 (×2): qty 1

## 2018-05-03 MED ORDER — METOPROLOL TARTRATE 25 MG PO TABS: 12.5000 mg | ORAL_TABLET | Freq: Two times a day (BID) | ORAL | 0 refills | Status: DC

## 2018-05-03 NOTE — Progress Notes (Signed)
Patient given discharge instructions with prescriptions. Both IVs taken out and tele monitor off. Report also given to Lavada Mesi at group home. Lawanda informed patient is ready for discharge. Per Tommie Sams she will come for patient a little after 6pm. Facility will tranport patient back to group home.

## 2018-05-03 NOTE — Clinical Social Work Note (Signed)
[name] was informed that patient is ready for discharge, [name] contacted Lavada Mesi, group home manager, [phone], she said to contact her once patient is ready for discharge and she will make arrangements to have patient return back to family care home.  [name] updated bedside nurse to inform her that facility will transport patient.  Patient to be d[facility]c'ed [facility].  Patient and family care home agreeable to plans will transport via facility transportation RN to call report [phone].  [name] attempted to contact group home administrator to inform her that patient is ready for discharge, message left awaiting for call back.  Evette Cristal, MSW, [place]

## 2018-05-03 NOTE — NC FL2 (Signed)
[place] LEVEL OF CARE SCREENING TOOL     IDENTIFICATION  Patient Name: Danny Cook. Birthdate: March 25, 1961 Sex: male Admission Date (Current Location): 04/25/2018  [place] and [place] Number:  Engineering geologist and Address:  [facility], [address] [place], [place] [address]      Provider Number: [id number]  Attending Physician Name and Address:  Vaughan Basta, *  Relative Name and Phone Number:  Reiner Loewen- father [phone]    Current Level of Care: Hospital Recommended Level of Care: Other (Comment)([facility]) Prior Approval Number:    Date Approved/Denied:   PASRR Number:    Discharge Plan: Other (Comment)(Family Care Home)    Current Diagnoses: Patient Active Problem List   Diagnosis Date Noted  . Pleural effusion   . FUO (fever of unknown origin)   . MGUS (monoclonal gammopathy of unknown significance)   . 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 Healthcare associated bacterial pneumonia 04/25/2018  . Community acquired pneumonia 04/12/2018  . Pneumonia 04/12/2018  . AKI (acute kidney injury) ([place]) 03/23/2018  . Pericarditis with effusion 03/23/2018  . Schizoaffective disorder, depressive type ([place]) 11/04/2017  . HIV positive ([place]) 11/04/2017  . Orthostatic dizziness 07/31/2017  . Chronic diastolic heart failure ([place]) 07/29/2017  . HTN (hypertension) 07/29/2017  . Diabetes ([place]) 07/29/2017  . Schizophrenia ([place]) 07/29/2017    Orientation RESPIRATION BLADDER Height & Weight     Self, Time, Place, Situation  Normal Continent Weight: 205 lb (93 kg) Height:  5\' 7"  (170.2 cm)  BEHAVIORAL SYMPTOMS/MOOD NEUROLOGICAL BOWEL NUTRITION STATUS      Continent Diet(Carb Modified)  AMBULATORY STATUS COMMUNICATION OF NEEDS Skin   Limited Assist Verbally Normal                       Personal Care Assistance Level of Assistance   Bathing, Feeding, Dressing Bathing Assistance: Limited assistance Feeding assistance: Independent Dressing Assistance: Limited assistance     Functional Limitations Info  Sight, Hearing, Speech Sight Info: Adequate Hearing Info: Adequate Speech Info: Adequate    SPECIAL CARE FACTORS FREQUENCY                       Contractures Contractures Info: Not present    Additional Factors Info  Code Status, Allergies, Psychotropic, Insulin Sliding Scale Code Status Info: Full code Allergies Info: TRAZODONE AND NEFAZODONE, VIREAD TENOFOVIR DISOPROXIL, ETODOLAC  Psychotropic Info: divalproex (DEPAKOTE ER) 24 hr tablet 500 mg and mirtazapine (REMERON) tablet 30 mg and venlafaxine XR (EFFEXOR-XR) 24 hr capsule 75 mg  Insulin Sliding Scale Info: insulin aspart (novoLOG) injection 0-15 Units 3x a day with meals       Current Medications (05/03/2018):  This is the current hospital active medication list Current Facility-Administered Medications  Medication Dose Route Frequency Provider Last Rate Last Dose  . 0.9 %  sodium chloride infusion   Intravenous Continuous Greggory Keen, MD   Stopped at 04/30/18 0915  . abacavir-dolutegravir-lamiVUDine (TRIUMEQ) 600-50-300 MG per tablet 1 tablet  1 tablet Oral QAC breakfast Berton Mount, RPH   1 tablet at 05/03/18 0834  . acetaminophen (TYLENOL) tablet 650 mg  650 mg Oral Q6H PRN Tsosie Billing, MD      . aspirin EC tablet 325 mg  325 mg Oral Daily Arta Silence, MD   325 mg at 05/03/18 0836  . benzonatate (TESSALON) capsule 100 mg  100  mg Oral Q6H PRN Arta Silence, MD   100 mg at 05/02/18 1240  . bisacodyl (DULCOLAX) EC tablet 5 mg  5 mg Oral Daily PRN Arta Silence, MD      . budesonide (PULMICORT) nebulizer solution 1 mg  1 mg Nebulization BID Colin Broach A, RPH   1 mg at 05/03/18 0859  . butalbital-acetaminophen-caffeine (FIORICET, ESGIC) 50-325-40 MG per tablet 1-2 tablet  1-2 tablet Oral Q6H PRN  Arta Silence, MD   2 tablet at 04/26/18 0327  . chlorpheniramine-HYDROcodone (TUSSIONEX) 10-8 MG/5ML suspension 5 mL  5 mL Oral Q12H PRN Arta Silence, MD   5 mL at 04/30/18 1454  . diazepam (VALIUM) tablet 10 mg  10 mg Oral Q8H PRN Vaughan Basta, MD   10 mg at 05/03/18 0837  . diphenhydrAMINE (BENADRYL) capsule 50 mg  50 mg Oral QHS PRN Arta Silence, MD   50 mg at 04/29/18 2331  . divalproex (DEPAKOTE ER) 24 hr tablet 500 mg  500 mg Oral BID Arta Silence, MD   500 mg at 05/03/18 0836  . enoxaparin (LOVENOX) injection 40 mg  40 mg Subcutaneous Q24H Colin Broach A, RPH   40 mg at 05/03/18 0840  . feeding supplement (NEPRO CARB STEADY) liquid 237 mL  237 mL Oral BID BM Arta Silence, MD   237 mL at 05/03/18 1503  . gabapentin (NEURONTIN) capsule 100 mg  100 mg Oral TID Vaughan Basta, MD   100 mg at 05/03/18 1502  . guaiFENesin-dextromethorphan (ROBITUSSIN DM) 100-10 MG/5ML syrup 5 mL  5 mL Oral Q4H PRN Vaughan Basta, MD   5 mL at 05/02/18 1425  . iloperidone (FANAPT) tablet 12 mg  12 mg Oral BID Arta Silence, MD   12 mg at 05/03/18 0835  . insulin aspart (novoLOG) injection 0-15 Units  0-15 Units Subcutaneous TID WC Arta Silence, MD   8 Units at 05/03/18 1654  . insulin aspart (novoLOG) injection 0-5 Units  0-5 Units Subcutaneous QHS Arta Silence, MD   3 Units at 05/02/18 2144  . insulin aspart (novoLOG) injection 5 Units  5 Units Subcutaneous TID WC Vaughan Basta, MD   5 Units at 05/03/18 1655  . insulin glargine (LANTUS) injection 15 Units  15 Units Subcutaneous QHS Vaughan Basta, MD      . ipratropium-albuterol (DUONEB) 0.5-2.5 (3) MG/3ML nebulizer solution 3 mL  3 mL Nebulization Q4H PRN Arta Silence, MD   3 mL at 04/27/18 2028  . losartan (COZAAR) tablet 25 mg  25 mg Oral Daily Kolluru, Sarath, MD   25 mg at 05/03/18 0836  . metoprolol tartrate (LOPRESSOR) tablet 12.5 mg  12.5 mg Oral  BID Vaughan Basta, MD      . mirtazapine (REMERON) tablet 30 mg  30 mg Oral QHS Vaughan Basta, MD   30 mg at 05/02/18 2142  . nitroGLYCERIN (NITROSTAT) SL tablet 0.4 mg  0.4 mg Sublingual Q5 min PRN Arta Silence, MD      . ondansetron (ZOFRAN) injection 4 mg  4 mg Intravenous Q6H PRN Arta Silence, MD      . oxyCODONE (Oxy IR/ROXICODONE) immediate release tablet 5 mg  5 mg Oral Q6H PRN Vaughan Basta, MD   5 mg at 04/27/18 1722  . pantoprazole (PROTONIX) EC tablet 40 mg  40 mg Oral BID Vaughan Basta, MD   40 mg at 05/03/18 0835  . rosuvastatin (CRESTOR) tablet 40 mg  40 mg Oral Daily Vaughan Basta, MD   40 mg at 05/03/18 0836  .  senna-docusate (Senokot-S) tablet 1 tablet  1 tablet Oral QHS PRN Arta Silence, MD      . sucralfate (CARAFATE) tablet 1 g  1 g Oral BID Vaughan Basta, MD   1 g at 05/03/18 1025  . venlafaxine XR (EFFEXOR-XR) 24 hr capsule 75 mg  75 mg Oral Q breakfast Arta Silence, MD   75 mg at 05/03/18 0834  . vitamin B-12 (CYANOCOBALAMIN) tablet 1,000 mcg  1,000 mcg Oral Daily Arta Silence, MD   1,000 mcg at 05/03/18 0836     Discharge Medications: STOP taking these medications   amiodarone 100 MG tablet Commonly known as:  PACERONE   clonazePAM 1 MG tablet Commonly known as:  KLONOPIN   doxycycline 100 MG capsule Commonly known as:  VIBRAMYCIN   enalapril 2.5 MG tablet Commonly known as:  VASOTEC   metFORMIN 1000 MG tablet Commonly known as:  GLUCOPHAGE   predniSONE 10 MG tablet Commonly known as:  DELTASONE     TAKE these medications   acetaminophen 325 MG tablet Commonly known as:  TYLENOL Take 2 tablets (650 mg total) by mouth every 6 (six) hours as needed for fever (Fever >101).   albuterol 108 (90 Base) MCG/ACT inhaler Commonly known as:  PROVENTIL HFA;VENTOLIN HFA Inhale 2 puffs into the lungs every 6 (six) hours as needed for wheezing or shortness of breath.    aspirin EC 325 MG tablet Take 325 mg by mouth daily.   benzonatate 100 MG capsule Commonly known as:  TESSALON Take 1 capsule (100 mg total) by mouth every 6 (six) hours as needed for cough.   butalbital-acetaminophen-caffeine 50-325-40 MG tablet Commonly known as:  FIORICET, ESGIC Take 1-2 tablets by mouth every 6 (six) hours as needed. What changed:  reasons to take this   chlorpheniramine-HYDROcodone 10-8 MG/5ML Suer Commonly known as:  TUSSIONEX Take 5 mLs by mouth every 12 (twelve) hours as needed for cough.   cyanocobalamin 1000 MCG tablet Take 1,000 mcg by mouth daily.   diazepam 2 MG tablet Commonly known as:  VALIUM Take 1 tablet (2 mg total) by mouth every 8 (eight) hours as needed for anxiety (use for breakthrough anxiety).   diphenhydrAMINE 50 MG capsule Commonly known as:  BENADRYL Take 1 capsule (50 mg total) by mouth at bedtime as needed for sleep.   divalproex 250 MG 24 hr tablet Commonly known as:  DEPAKOTE ER Take 500 mg by mouth 2 (two) times daily.   FANAPT 12 MG Tabs Generic drug:  Iloperidone Take 12 mg by mouth 2 (two) times daily.   feeding supplement (NEPRO CARB STEADY) Liqd Take 237 mLs by mouth 2 (two) times daily between meals.   fluticasone 220 MCG/ACT inhaler Commonly known as:  FLOVENT HFA Inhale 2 puffs into the lungs 2 (two) times daily. Rinse out mouth afterwards   gabapentin 100 MG capsule Commonly known as:  NEURONTIN Take 1 capsule (100 mg total) by mouth 3 (three) times daily.   liraglutide 18 MG/3ML Sopn Commonly known as:  VICTOZA Inject 1.8 mg into the skin See admin instructions. Daily if blood sugar is greater than 85; patient uses tresiba if blood sugar is less than 85   losartan 25 MG tablet Commonly known as:  COZAAR Take 1 tablet (25 mg total) by mouth daily. Start taking on:  05/04/2018   metoprolol tartrate 25 MG tablet Commonly known as:  LOPRESSOR Take 0.5 tablets (12.5 mg total) by mouth 2  (two) times daily.   mirtazapine 30 MG  tablet Commonly known as:  REMERON Take 30 mg by mouth at bedtime.   nitroGLYCERIN 0.4 MG SL tablet Commonly known as:  NITROSTAT Place 0.4 mg under the tongue every 5 (five) minutes as needed for chest pain.   omeprazole 40 MG capsule Commonly known as:  PRILOSEC Take 40 mg by mouth 2 (two) times daily.   promethazine 6.25 MG/5ML syrup Commonly known as:  PHENERGAN Take 6.25 mg by mouth every 6 (six) hours as needed for nausea or vomiting.   rosuvastatin 20 MG tablet Commonly known as:  CRESTOR Take 40 mg by mouth daily.   sucralfate 1 g tablet Commonly known as:  CARAFATE Take 1 g by mouth 2 (two) times daily.   TRESIBA FLEXTOUCH 200 UNIT/ML Sopn Generic drug:  Insulin Degludec Inject 20 Units into the skin See admin instructions. Daily if blood sugar >85; If bs <85, patient uses victoza   triamcinolone cream 0.1 % Commonly known as:  KENALOG Apply 1 application topically 2 (two) times daily as needed. What changed:  reasons to take this   TRIUMEQ 600-50-300 MG tablet Generic drug:  abacavir-dolutegravir-lamiVUDine Take 1 tablet by mouth daily.   venlafaxine XR 75 MG 24 hr capsule Commonly known as:  EFFEXOR-XR Take 1 capsule (75 mg total) by mouth daily with breakfast.     Relevant Imaging Results:  Relevant Lab Results:   Additional Information SSN [id number]  Ross Ludwig, [place]

## 2018-05-03 NOTE — Discharge Summary (Signed)
[facility] NAME: Danny Cook    MR#:  [id number]  DATE OF BIRTH:  07/27/1960  DATE OF ADMISSION:  04/25/2018 ADMITTING PHYSICIAN: Arta Silence, MD  DATE OF DISCHARGE: 05/03/2018   PRIMARY CARE PHYSICIAN: [facility]    ADMISSION DIAGNOSIS:  Hypoxia [R09.02] Fever, unspecified fever cause [R50.9] Community acquired pneumonia of left lung, unspecified part of lung [J18.9] Sepsis, due to unspecified organism, unspecified whether acute organ dysfunction present ([place]) [A41.9]  DISCHARGE DIAGNOSIS:  Active Problems:   Healthcare associated bacterial pneumonia   FSGS (focal segmental glomerulosclerosis)   Pleural effusion   FUO (fever of unknown origin)   MGUS (monoclonal gammopathy of unknown significance)   SECONDARY DIAGNOSIS:   Past Medical History:  Diagnosis Date  . Anemia   . Anxiety   . Arthritis   . CHF (congestive heart failure) ([place])   . Chronic kidney disease    Renal Insufficiency Syndrome; Glomerulosclerosis 2013  . Complication of anesthesia   . Depressed   . Diabetes mellitus without complication ([place])   . High cholesterol   . HIV (human immunodeficiency virus infection) ([place])   . Hypertension   . 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 Paranoid disorder ([place])   . Schizophrenia, paranoid ([place])   . Sleep apnea     HOSPITAL COURSE:   57 year old male with past medical history of schizophrenia, HIV, obstructive sleep apnea, diabetes, hypertension, CKD stage IV, chronic anemia, history of Kaposi's sarcoma who presents to the hospital due to suspected sepsis.  1.  Sepsis- ruled out.-this was the working diagnosis on admission given patient's fever, tachycardia, elevated white cell count and suspected to have pneumonia.  Patient has been hospitalized multiple times for similar reasons and improved with IV steroids and IV antibiotics. - Seen by infectious disease and they do not think that the  patient has underlying sepsis or pneumonia but likely some kind of an autoimmune process which is steroid responsive.  Sepsis has now been ruled out.  Patient's cultures remain negative and patient has been tapered off antibiotics. - AFB work ups sent. Negative. - Follow with ID clinic.   2.  Pneumonia-this was suspected on admission and patient has had repeated admissions for similar complaints and similar reasons and has responded well to antibiotics. -Pulmonary was consulted and they do not think that the patient has underlying pneumonia.  Patient was noted to have a left-sided pleural effusion and is status post thoracentesis but the pleural fluid analysis does not seem infectious in nature. - Pulmonary is possibly considering primary tuberculosis and patient had repeated QuantiFERON testing results of which is still pending.  Patient's pleural fluid cytometry is also still pending.  CT scan of the chest obtained today which shows small pleural effusion but no evidence of left lower lobe infiltrate.  Pulmonary to possibly discuss with cardiothoracic surgery for pleural biopsy if the bone marrow biopsy and further serologic testing are not diagnostic.  3.  Steroid responsive systemic illness- etiology unclear presently. - Seen by pulmonary, rheumatology, ID, Nephrology and also Hem/Onc and appreciate input.  Patient's case was discussed with case conference today with all specialists yesterday.  Rheumatology is possibly considering amyloidosis as patient had a mild monoclonal gammopathy with some mild anemia.  - Seen by Hem/onc but their suspicion for Amyloid is low.  Bone Marrow biopsy is done . Serologic testing as per Onc has been done.  - Follow in ID clinic for results.  4.  Diabetes type 2 without complication-continue sliding scale insulin - BS running high, so added lantus 10 U daily.  5.  CKD stage IV-patient's creatinine is at baseline.  Seen by nephrology patient has a history of  FSGS and has not nonnephrotic proteinuria. -Cr at baseline and will monitor.   6.  History of migraines-continue Fioricet.  7.  Anxiety/depression -continue Klonopin, Effexor.   pt complained clonazepam not helping, stopped that- added and now increased valium.  8.  History of bipolar disorder-continue Depakote.  9. GERD - cont. Protonix.    10. Hx of A. Fib -rate controlled, patient was on amiodarone but it was discontinued given possible underlying autoimmune process and leading to pleural effusion. -Continue metoprolol, appreciate cardiology input. Cont. ASA - HR under control. Had some bradycardia episodes, decreased dose of metoprolol.   DISCHARGE CONDITIONS:    Stable.  CONSULTS OBTAINED:  Treatment Team:  Arta Silence, MD Emmaline Kluver., MD Tsosie Billing, MD Teodoro Spray, MD Earlie Server, MD  DRUG ALLERGIES:   Allergies  Allergen Reactions  . Trazodone And Nefazodone   . Viread [Tenofovir Disoproxil]     Damages kidneys  . Etodolac Rash    DISCHARGE MEDICATIONS:   Allergies as of 05/03/2018      Reactions   Trazodone And Nefazodone    Viread [tenofovir Disoproxil]    Damages kidneys   Etodolac Rash      Medication List    STOP taking these medications   amiodarone 100 MG tablet Commonly known as:  PACERONE   clonazePAM 1 MG tablet Commonly known as:  KLONOPIN   doxycycline 100 MG capsule Commonly known as:  VIBRAMYCIN   enalapril 2.5 MG tablet Commonly known as:  VASOTEC   metFORMIN 1000 MG tablet Commonly known as:  GLUCOPHAGE   predniSONE 10 MG tablet Commonly known as:  DELTASONE     TAKE these medications   acetaminophen 325 MG tablet Commonly known as:  TYLENOL Take 2 tablets (650 mg total) by mouth every 6 (six) hours as needed for fever (Fever >101).   albuterol 108 (90 Base) MCG/ACT inhaler Commonly known as:  PROVENTIL HFA;VENTOLIN HFA Inhale 2 puffs into the lungs every 6 (six) hours as needed  for wheezing or shortness of breath.   aspirin EC 325 MG tablet Take 325 mg by mouth daily.   benzonatate 100 MG capsule Commonly known as:  TESSALON Take 1 capsule (100 mg total) by mouth every 6 (six) hours as needed for cough.   butalbital-acetaminophen-caffeine 50-325-40 MG tablet Commonly known as:  FIORICET, ESGIC Take 1-2 tablets by mouth every 6 (six) hours as needed. What changed:  reasons to take this   chlorpheniramine-HYDROcodone 10-8 MG/5ML Suer Commonly known as:  TUSSIONEX Take 5 mLs by mouth every 12 (twelve) hours as needed for cough.   cyanocobalamin 1000 MCG tablet Take 1,000 mcg by mouth daily.   diazepam 2 MG tablet Commonly known as:  VALIUM Take 1 tablet (2 mg total) by mouth every 8 (eight) hours as needed for anxiety (use for breakthrough anxiety).   diphenhydrAMINE 50 MG capsule Commonly known as:  BENADRYL Take 1 capsule (50 mg total) by mouth at bedtime as needed for sleep.   divalproex 250 MG 24 hr tablet Commonly known as:  DEPAKOTE ER Take 500 mg by mouth 2 (two) times daily.   FANAPT 12 MG Tabs Generic drug:  Iloperidone Take 12 mg by mouth 2 (two) times daily.   feeding supplement (NEPRO CARB  STEADY) Liqd Take 237 mLs by mouth 2 (two) times daily between meals.   fluticasone 220 MCG/ACT inhaler Commonly known as:  FLOVENT HFA Inhale 2 puffs into the lungs 2 (two) times daily. Rinse out mouth afterwards   gabapentin 100 MG capsule Commonly known as:  NEURONTIN Take 1 capsule (100 mg total) by mouth 3 (three) times daily.   liraglutide 18 MG/3ML Sopn Commonly known as:  VICTOZA Inject 1.8 mg into the skin See admin instructions. Daily if blood sugar is greater than 85; patient uses tresiba if blood sugar is less than 85   losartan 25 MG tablet Commonly known as:  COZAAR Take 1 tablet (25 mg total) by mouth daily. Start taking on:  05/04/2018   metoprolol tartrate 25 MG tablet Commonly known as:  LOPRESSOR Take 0.5 tablets (12.5  mg total) by mouth 2 (two) times daily.   mirtazapine 30 MG tablet Commonly known as:  REMERON Take 30 mg by mouth at bedtime.   nitroGLYCERIN 0.4 MG SL tablet Commonly known as:  NITROSTAT Place 0.4 mg under the tongue every 5 (five) minutes as needed for chest pain.   omeprazole 40 MG capsule Commonly known as:  PRILOSEC Take 40 mg by mouth 2 (two) times daily.   promethazine 6.25 MG/5ML syrup Commonly known as:  PHENERGAN Take 6.25 mg by mouth every 6 (six) hours as needed for nausea or vomiting.   rosuvastatin 20 MG tablet Commonly known as:  CRESTOR Take 40 mg by mouth daily.   sucralfate 1 g tablet Commonly known as:  CARAFATE Take 1 g by mouth 2 (two) times daily.   TRESIBA FLEXTOUCH 200 UNIT/ML Sopn Generic drug:  Insulin Degludec Inject 20 Units into the skin See admin instructions. Daily if blood sugar >85; If bs <85, patient uses victoza   triamcinolone cream 0.1 % Commonly known as:  KENALOG Apply 1 application topically 2 (two) times daily as needed. What changed:  reasons to take this   TRIUMEQ 600-50-300 MG tablet Generic drug:  abacavir-dolutegravir-lamiVUDine Take 1 tablet by mouth daily.   venlafaxine XR 75 MG 24 hr capsule Commonly known as:  EFFEXOR-XR Take 1 capsule (75 mg total) by mouth daily with breakfast.        DISCHARGE INSTRUCTIONS:    Follow with ID clinic.  If you experience worsening of your admission symptoms, develop shortness of breath, life threatening emergency, suicidal or homicidal thoughts you must seek medical attention immediately by calling 911 or calling your MD immediately  if symptoms less severe.  You Must read complete instructions/literature along with all the possible adverse reactions/side effects for all the Medicines you take and that have been prescribed to you. Take any new Medicines after you have completely understood and accept all the possible adverse reactions/side effects.   Please note  You were  cared for by a hospitalist during your hospital stay. If you have any questions about your discharge medications or the care you received while you were in the hospital after you are discharged, you can call the unit and asked to speak with the hospitalist on call if the hospitalist that took care of you is not available. Once you are discharged, your primary care physician will handle any further medical issues. Please note that NO REFILLS for any discharge medications will be authorized once you are discharged, as it is imperative that you return to your primary care physician (or establish a relationship with a primary care physician if you do not have  one) for your aftercare needs so that they can reassess your need for medications and monitor your lab values.    Today   CHIEF COMPLAINT:   Chief Complaint  Patient presents with  . Cough    HISTORY OF PRESENT ILLNESS:  Zakir Henner  is a 57 y.o. male with a known history of T2NIDDM, HTN, HLD, CKD III, HIV ([place]), intellectual disability p/w pneumonia, sepsis. Pt appears to have mild mental retardation/intellectual disability, but he is AAOx3. He is able to provide a minimal Hx, but his complaints are largely vague, and his narrative does not follow a specific timecourse. He tells me that he felt fairly well up until Saturday 04/24/2018, when he developed non-productive cough, SOB, and decreased exercise tolerance. He then tells me he has had chills and diaphoresis x2d. He then tells me he had diarrhea 3-4d ago, but it stopped. He is tachypneic, but is not in distress. He denies chest pain. He says he feels okay. Pt meets SIRS criteria, (+) F/WBC + tachypnea, (+) sepsis 2/2 HCAP. Pt exhibits a flat affect. I explained that he probably has a lung infection. He tells me he understands.   VITAL SIGNS:  Blood pressure 127/77, pulse (!) 55, temperature (!) 97.5 F (36.4 C), temperature source Oral, resp. rate 19, height 5' 7" (1.702 m), weight 93  kg, SpO2 99 %.  I/O:    Intake/Output Summary (Last 24 hours) at 05/03/2018 1634 Last data filed at 05/03/2018 1405 Gross per 24 hour  Intake 360 ml  Output 1225 ml  Net -865 ml    PHYSICAL EXAMINATION:  GENERAL:  57 y.o.-year-old patient lying in the bed with no acute distress.  EYES: Pupils equal, round, reactive to light and accommodation. No scleral icterus. Extraocular muscles intact.  HEENT: Head atraumatic, normocephalic. Oropharynx and nasopharynx clear.  NECK:  Supple, no jugular venous distention. No thyroid enlargement, no tenderness.  LUNGS: Normal breath sounds bilaterally, no wheezing, rales,rhonchi or crepitation. No use of accessory muscles of respiration.  CARDIOVASCULAR: S1, S2 normal. No murmurs, rubs, or gallops.  ABDOMEN: Soft, non-tender, non-distended. Bowel sounds present. No organomegaly or mass.  EXTREMITIES: No pedal edema, cyanosis, or clubbing.  NEUROLOGIC: Cranial nerves II through XII are intact. Muscle strength 5/5 in all extremities. Sensation intact. Gait not checked.  PSYCHIATRIC: The patient is alert and oriented x 3.  SKIN: No obvious rash, lesion, or ulcer.   DATA REVIEW:   CBC Recent Labs  Lab 05/02/18 0548  WBC 5.1  HGB 8.7*  HCT 27.3*  PLT 293    Chemistries  Recent Labs  Lab 05/02/18 0548  NA 138  K 3.8  CL 101  CO2 26  GLUCOSE 258*  BUN 21*  CREATININE 1.21  CALCIUM 8.7*  AST 19  ALT 42  ALKPHOS 106  BILITOT 0.4    Cardiac Enzymes No results for input(s): TROPONINI in the last 168 hours.  Microbiology Results  Results for orders placed or performed during the hospital encounter of 04/25/18  Blood Culture (routine x 2)     Status: None   Collection Time: 04/25/18  3:20 AM  Result Value Ref Range Status   Specimen Description BLOOD RIGHT ANTECUBITAL  Final   Special Requests   Final    BOTTLES DRAWN AEROBIC AND ANAEROBIC Blood Culture results may not be optimal due to an excessive volume of blood received in  culture bottles   Culture   Final    NO GROWTH 5 DAYS Performed at [facility], [address], [place], [place] [address]    Report Status 04/30/2018 FINAL  Final  Blood Culture (routine x 2)     Status: None   Collection Time: 04/25/18  3:54 AM  Result Value Ref Range Status   Specimen Description BLOOD BLOOD LEFT HAND  Final   Special Requests   Final    BOTTLES DRAWN AEROBIC AND ANAEROBIC Blood Culture results may not be optimal due to an excessive volume of blood received in culture bottles   Culture   Final    NO GROWTH 5 DAYS Performed at [facility], [address] [place], [place] [address]    Report Status 04/30/2018 FINAL  Final  Urine culture     Status: None   Collection Time: 04/25/18  6:57 AM  Result Value Ref Range Status   Specimen Description   Final    URINE, RANDOM Performed at [facility], [address], [place], [place] [address]    Special Requests   Final    NONE Performed at [facility], [address] [place], [place] [address]    Culture   Final    NO GROWTH Performed at [facility], [place] [address] [place], [place] [address]    Report Status 04/26/2018 FINAL  Final  MRSA PCR Screening     Status: None   Collection Time: 04/26/18  1:08 AM  Result Value Ref Range Status   MRSA by PCR NEGATIVE NEGATIVE Final    Comment:        The GeneXpert MRSA Assay (FDA approved for NASAL specimens only), is one component of a comprehensive MRSA colonization surveillance program. It is not intended to diagnose MRSA infection nor to guide or monitor treatment for MRSA infections. Performed at [facility] [address] [place], [place] [address]   Body fluid culture     Status: None   Collection Time: 04/27/18  9:50 AM  Result Value Ref Range Status   Specimen Description   Final    PLEURAL Performed at [facility], [address] [place], [place] [address]    Special  Requests   Final    NONE Performed at [facility], [address] [place], [place] [address]    Gram Stain   Final    RARE WBC PRESENT, PREDOMINANTLY PMN NO ORGANISMS SEEN    Culture   Final    NO GROWTH 3 DAYS Performed at [facility], [place] [address] [place], [place] [address]    Report Status 04/30/2018 FINAL  Final  Fungus Culture With Stain     Status: None (Preliminary result)   Collection Time: 04/27/18  9:50 AM  Result Value Ref Range Status   Fungus Stain Final report  Final    Comment: (NOTE) Performed At: [facility] [place], [place] [id number] Rush Farmer MD [id number]    Fungus (Mycology) Culture PENDING  Incomplete   Fungal Source PLEURAL  Final    Comment: Performed at [facility], [address] [place],  [address]  Acid Fast Smear (AFB)     Status: None   Collection Time: 04/27/18  9:50 AM  Result Value Ref Range Status   AFB Specimen Processing Concentration  Final   Acid Fast Smear Negative  Final    Comment: (NOTE) Performed At: [facility] [address] [place], [place] [id number] Rush Farmer MD [id number]    Source (AFB) PLEURAL  Final    Comment: Performed at [facility], [address] [place], [place] [address]  Acid Fast Culture with reflexed sensitivities     Status: None   Collection Time: 04/27/18  9:50 AM  Result Value Ref Range Status   Acid Fast Culture Concentration  Final    Comment: (NOTE) Performed At: [facility] [place], [place] [id number] Rush Farmer MD OF:[id number]    Source of Sample PLEURAL  Final    Comment: Performed at [facility], [address] [place], [place] [address]  Fungus Culture Result     Status: None   Collection Time: 04/27/18  9:50 AM  Result Value Ref Range Status   Result 1 Comment  Final    Comment: (NOTE) KOH/Calcofluor preparation:  no fungus observed. Performed At:  [facility] [place], [place] [id number] Rush Farmer MD [id number]   CULTURE, BLOOD (ROUTINE X 2) w Reflex to ID Panel     Status: None (Preliminary result)   Collection Time: 04/29/18 10:58 AM  Result Value Ref Range Status   Specimen Description BLOOD BLOOD RIGHT HAND  Final   Special Requests   Final    BOTTLES DRAWN AEROBIC AND ANAEROBIC Blood Culture adequate volume   Culture   Final    NO GROWTH 4 DAYS Performed at [facility], [address] [place], [place] [address]    Report Status PENDING  Incomplete  CULTURE, BLOOD (ROUTINE X 2) w Reflex to ID Panel     Status: None (Preliminary result)   Collection Time: 04/29/18 11:05 AM  Result Value Ref Range Status   Specimen Description BLOOD BLOOD LEFT HAND  Final   Special Requests   Final    BOTTLES DRAWN AEROBIC AND ANAEROBIC Blood Culture adequate volume   Culture   Final    NO GROWTH 4 DAYS Performed at [facility], [address] [place], [place] [address]    Report Status PENDING  Incomplete  Acid Fast Smear (AFB)     Status: None   Collection Time: 04/30/18  8:57 AM  Result Value Ref Range Status   AFB Specimen Processing Concentration  Final   Acid Fast Smear Negative  Final    Comment: (NOTE) Performed At: [facility] [place], [place] [id number] Rush Farmer MD RR:[id number]    Source (AFB) BONE MARROW  Final    Comment: Performed at [facility], [address] [place], [place] [address]  Aerobic/Anaerobic Culture (surgical/deep wound)     Status: None (Preliminary result)   Collection Time: 04/30/18  8:59 AM  Result Value Ref Range Status   Specimen Description   Final    BONE MARROW Performed at [facility], [address], [place], [place] [address]    Special Requests   Final    Immunocompromised Performed at [facility], [address] [place], [place] [address]    Gram Stain   Final     FEW WBC PRESENT, PREDOMINANTLY PMN RARE GRAM NEGATIVE RODS    Culture   Final    NO GROWTH 3 DAYS NO ANAEROBES ISOLATED; CULTURE IN PROGRESS FOR 5 DAYS Performed at [facility], [place] [address], [place], [place] [address]    Report Status PENDING  Incomplete    RADIOLOGY:  Dg Chest 2 View  Result Date: 05/03/2018 CLINICAL DATA:  Pneumonia and sepsis, hypoxia. History of CHF, chronic kidney disease, HIV, cap see sarcoma.  EXAM: CHEST - 2 VIEW COMPARISON:  Chest x-ray of April 29, 2018 and chest CT scan of April 29, 2018 FINDINGS: The lungs are borderline hypoinflated. There is left basilar density consistent with atelectasis or infiltrate. There is a small left pleural effusion and trace right pleural effusion. The cardiac silhouette is mildly enlarged. The pulmonary vascularity is normal. There is calcification in the wall of the aortic arch. The observed bony thorax is unremarkable. IMPRESSION: Left basilar atelectasis or pneumonia perhaps slightly improved today. Persistent small left and trace right pleural effusions. Thoracic aortic atherosclerosis. Electronically Signed   By: David  Martinique M.D.   On: 05/03/2018 07:59    EKG:   Orders placed or performed during the hospital encounter of 04/25/18  . ED EKG 12-Lead  . ED EKG 12-Lead      Management plans discussed with the patient, family and they are in agreement.  CODE STATUS:     Code Status Orders  (From admission, onward)         Start     Ordered   04/25/18 0921  Full code  Continuous     04/25/18 0920        Code Status History    Date Active Date Inactive Code Status Order ID Comments User Context   04/12/2018 1407 04/14/2018 1852 Full Code [id number]  Vaughan Basta, MD Inpatient   03/23/2018 2123 03/27/2018 1802 Full Code [id number]  Lance Coon, MD ED   11/04/2017 2050 11/09/2017 1400 Full Code [id number]  Gonzella Lex, MD Inpatient   07/31/2017 0240 08/01/2017 1952 Full Code [id number]  [name], MD ED      TOTAL TIME TAKING CARE OF THIS PATIENT: 35 minutes.    Vaughan Basta M.D on 05/03/2018 at 4:34 PM  Between 7am to 6pm - Pager - [phone]  After 6pm go to [email or web address] - password [email or web address] [facility]  Office  [phone]  CC: Primary care physician; Katheren Shams   Note: This dictation was prepared with Dragon dictation along with smaller phrase technology. Any transcriptional errors that result from this process are unintentional.

## 2018-05-03 NOTE — Care Management Important Message (Signed)
Copy of signed IM left with patient in room.  

## 2018-05-03 NOTE — Progress Notes (Signed)
[facility] [place] NAME: Danny Cook    MR#:  [id number]  DATE OF BIRTH:  Feb 27, 1961  SUBJECTIVE:   Patient denies any complaints, sitting up and eating breakfast.  No fever in last 1-2 days.  Seen by hematology oncology and the plan for IR guided bone marrow biopsy - done. No fever now.  cont to have anxiety episodes.  REVIEW OF SYSTEMS:    Review of Systems  Constitutional: Negative for chills and fever.  HENT: Negative for congestion and tinnitus.   Eyes: Negative for blurred vision and double vision.  Respiratory: Negative for cough, shortness of breath and wheezing.   Cardiovascular: Negative for chest pain, orthopnea and PND.  Gastrointestinal: Negative for abdominal pain, diarrhea, nausea and vomiting.  Genitourinary: Negative for dysuria and hematuria.  Neurological: Negative for dizziness, sensory change and focal weakness.  Psychiatric/Behavioral:       Anxiety  All other systems reviewed and are negative.   Nutrition: Heart healthy/Carb modified Tolerating Diet: yes Tolerating PT: Ambulatory  DRUG ALLERGIES:   Allergies  Allergen Reactions  . Trazodone And Nefazodone   . Viread [Tenofovir Disoproxil]     Damages kidneys  . Etodolac Rash    VITALS:  Blood pressure 122/60, pulse (!) 47, temperature 97.9 F (36.6 C), temperature source Oral, resp. rate 19, height 5' 7" (1.702 m), weight 93 kg, SpO2 95 %.  PHYSICAL EXAMINATION:   Physical Exam  GENERAL:  57 y.o.-year-old obese patient sitting up in chair eating breakfast in no acute distress.  EYES: Pupils equal, round, reactive to light and accommodation. No scleral icterus. Extraocular muscles intact.  HEENT: Head atraumatic, normocephalic. Oropharynx and nasopharynx clear.  NECK:  Supple, no jugular venous distention. No thyroid enlargement, no tenderness.  LUNGS: Normal breath sounds bilaterally, no wheezing, rales, rhonchi. No use of accessory  muscles of respiration.  CARDIOVASCULAR: S1, S2 normal. No murmurs, rubs, or gallops.  ABDOMEN: Soft, nontender, nondistended. Bowel sounds present. No organomegaly or mass.  EXTREMITIES: No cyanosis, clubbing or edema b/l.    NEUROLOGIC: Cranial nerves II through XII are intact. No focal Motor or sensory deficits b/l. Globally weak.  PSYCHIATRIC: The patient is alert and oriented x 3.  SKIN: No obvious rash, lesion, or ulcer.    LABORATORY PANEL:   CBC Recent Labs  Lab 05/02/18 0548  WBC 5.1  HGB 8.7*  HCT 27.3*  PLT 293   ------------------------------------------------------------------------------------------------------------------  Chemistries  Recent Labs  Lab 05/02/18 0548  NA 138  K 3.8  CL 101  CO2 26  GLUCOSE 258*  BUN 21*  CREATININE 1.21  CALCIUM 8.7*  AST 19  ALT 42  ALKPHOS 106  BILITOT 0.4   ------------------------------------------------------------------------------------------------------------------  Cardiac Enzymes No results for input(s): TROPONINI in the last 168 hours. ------------------------------------------------------------------------------------------------------------------  RADIOLOGY:  Dg Chest 2 View  Result Date: 05/03/2018 CLINICAL DATA:  Pneumonia and sepsis, hypoxia. History of CHF, chronic kidney disease, HIV, cap see sarcoma. EXAM: CHEST - 2 VIEW COMPARISON:  Chest x-ray of April 29, 2018 and chest CT scan of April 29, 2018 FINDINGS: The lungs are borderline hypoinflated. There is left basilar density consistent with atelectasis or infiltrate. There is a small left pleural effusion and trace right pleural effusion. The cardiac silhouette is mildly enlarged. The pulmonary vascularity is normal. There is calcification in the wall of the aortic arch. The observed bony thorax is unremarkable. IMPRESSION: Left basilar atelectasis or pneumonia perhaps slightly improved today.  Persistent small left and trace right pleural  effusions. Thoracic aortic atherosclerosis. Electronically Signed   By: David  Martinique M.D.   On: 05/03/2018 07:59     ASSESSMENT AND PLAN:   57 year old male with past medical history of schizophrenia, HIV, obstructive sleep apnea, diabetes, hypertension, CKD stage IV, chronic anemia, history of Kaposi's sarcoma who presents to the hospital due to suspected sepsis.  1.  Sepsis-this was the working diagnosis on admission given patient's fever, tachycardia, elevated white cell count and suspected to have pneumonia.  Patient has been hospitalized multiple times for similar reasons and improved with IV steroids and IV antibiotics. - Seen by infectious disease and they do not think that the patient has underlying sepsis or pneumonia but likely some kind of an autoimmune process which is steroid responsive.  Sepsis has now been ruled out.  Patient's cultures remain negative and patient has been tapered off antibiotics. - AFB work ups sent.  2.  Pneumonia-this was suspected on admission and patient has had repeated admissions for similar complaints and similar reasons and has responded well to antibiotics. -Pulmonary was consulted and they do not think that the patient has underlying pneumonia.  Patient was noted to have a left-sided pleural effusion and is status post thoracentesis but the pleural fluid analysis does not seem infectious in nature. - Pulmonary is possibly considering primary tuberculosis and patient had repeated QuantiFERON testing results of which is still pending.  Patient's pleural fluid cytometry is also still pending.  CT scan of the chest obtained today which shows small pleural effusion but no evidence of left lower lobe infiltrate.  Pulmonary to possibly discuss with cardiothoracic surgery for pleural biopsy if the bone marrow biopsy and further serologic testing are not diagnostic.  3.  Steroid responsive systemic illness- etiology unclear presently. - Seen by pulmonary,  rheumatology, ID, Nephrology and also Hem/Onc and appreciate input.  Patient's case was discussed with case conference today with all specialists yesterday.  Rheumatology is possibly considering amyloidosis as patient had a mild monoclonal gammopathy with some mild anemia.  - Seen by Hem/onc but their suspicion for Amyloid is low.  Bone Marrow biopsy is done . Serologic testing as per Onc has been done.   4.  Diabetes type 2 without complication-continue sliding scale insulin - BS running high, so added lantus 10 U daily.  5.  CKD stage IV-patient's creatinine is at baseline.  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 -Cr at baseline and will monitor.   6.  History of migraines-continue Fioricet.  7.  Anxiety/depression -continue Klonopin, Effexor.   pt complained clonazepam not helping, stopped that- added and now increased valium.  8.  History of bipolar disorder-continue Depakote.  9. GERD - cont. Protonix.    10. Hx of A. Fib -rate controlled, patient was on amiodarone but it was discontinued given possible underlying autoimmune process and leading to pleural effusion. -Continue metoprolol, appreciate cardiology input. Cont. ASA   All the records are reviewed and case discussed with Care Management/Social Worker. Management plans discussed with the patient, family and they are in agreement.  CODE STATUS: Full code  DVT Prophylaxis: Lovenox  TOTAL TIME TAKING CARE OF THIS PATIENT: 35 minutes.   POSSIBLE D/C IN 2-3 DAYS, DEPENDING ON CLINICAL CONDITION.   Vaughan Basta M.D on 05/03/2018 at 10:00 AM  Between 7am to 6pm - Pager - [phone]  After 6pm go to [email or web address] - [facility]  [facility]  Office  [phone]  CC: Primary care physician; Katheren Shams

## 2018-05-03 NOTE — Plan of Care (Signed)

## 2018-05-03 NOTE — Progress Notes (Signed)
Inpatient Diabetes Program Recommendations  AACE/ADA: New Consensus Statement on Inpatient Glycemic Control (2015)  Target Ranges:  Prepandial:   less than 140 mg/dL      Peak postprandial:   less than 180 mg/dL (1-2 hours)      Critically ill patients:  140 - 180 mg/dL   Lab Results  Component Value Date   GLUCAP 326 (H) 05/03/2018   HGBA1C 7.8 (H) 11/05/2017    Review of Glycemic ControlResults for KAMDYN, COLBORN (MRN [id number]) as of 05/03/2018 15:11  Ref. Range 05/02/2018 16:59 05/02/2018 21:00 05/03/2018 04:54 05/03/2018 07:23 05/03/2018 11:21  Glucose-Capillary Latest Ref Range: 70 - 99 mg/dL 241 (H) 278 (H) 203 (H) 347 (H) 326 (H)   Home DM Meds: Tresiba (insulin degludec U200) 80 units Daily (see PCP note from Care Everywhere from 04/19/2018)                             Victoza 1.8 mg Daily                             Metformin 1000 mg BID  Current Orders: Novolog Moderate Correction Scale/ SSI (0-15 units) TID AC + HS                            Lantus 15 units q HS                            Novolog 5 units tid with meals (meal coverage)  Inpatient Diabetes Program Recommendations:    Note insulin adjustments today.  If fasting blood sugars remain>goal, consider increasing Lantus to 30 units q HS.   Thanks  Adah Perl, RN, BC-ADM Inpatient Diabetes Coordinator Pager [phone] (8a-5p)

## 2018-05-03 NOTE — Progress Notes (Signed)
[facility]  ROUNDING NOTE   Subjective:   Patient states he is breathing better No cute c/o No leg edema  Objective:  Vital signs in last 24 hours:  Temp:  [97.5 F (36.4 C)-97.9 F (36.6 C)] 97.5 F (36.4 C) (11/04 1516) Pulse Rate:  [47-58] 55 (11/04 1516) Resp:  [19-20] 19 (11/04 1516) BP: (122-149)/(60-120) 127/77 (11/04 1516) SpO2:  [95 %-99 %] 99 % (11/04 1516)  Weight change:  Filed Weights   04/26/18 0003  Weight: 93 kg    Intake/Output: I/O last 3 completed shifts: In: -  Out: 1790 [Urine:1790]   Intake/Output this shift:  Total I/O In: 360 [P.O.:360] Out: 500 [Urine:500]  Physical Exam: General: NAD, sitting up in chaor  Head: Normocephalic, atraumatic. Moist oral mucosal membranes  Eyes: Anicteric,   Neck: Supple,   Lungs:  Clear to auscultation  Heart: Regular rate and rhythm  Abdomen:  Soft, nontender,   Extremities:  no peripheral edema.  Neurologic: Nonfocal, moving all four extremities  Skin: No acute lesions        Basic Metabolic Panel: Recent Labs  Lab 04/26/18 1811 04/27/18 0359 04/28/18 0634 05/02/18 0548  NA 137 140 141 138  K 3.8 3.5 3.6 3.8  CL 108 105 105 101  CO2 22 26 27 26   GLUCOSE 246* 238* 117* 258*  BUN 16 17 15  21*  CREATININE 1.59* 1.54* 1.52* 1.21  CALCIUM 8.0* 7.9* 8.3* 8.7*    Liver Function Tests: Recent Labs  Lab 04/28/18 1718 05/02/18 0548  AST 53* 19  ALT 35 42  ALKPHOS 72 106  BILITOT 0.4 0.4  PROT 6.7 6.2*  ALBUMIN 2.4* 2.3*   No results for input(s): LIPASE, AMYLASE in the last 168 hours. No results for input(s): AMMONIA in the last 168 hours.  CBC: Recent Labs  Lab 04/26/18 1811 04/27/18 0359 04/28/18 0634 04/30/18 0422 05/02/18 0548  WBC 14.1* 15.1* 9.5 6.5 5.1  NEUTROABS  --   --   --  3.7  --   HGB 9.3* 8.7* 8.5* 8.8* 8.7*  HCT 29.4* 27.9* 27.2* 27.9* 27.3*  MCV 93.3 94.3 93.5 91.5 90.7  PLT 167 151 168 231 293    Cardiac Enzymes: Recent Labs  Lab  04/29/18 1058  CKTOTAL 23*    BNP: Invalid input(s): POCBNP  CBG: Recent Labs  Lab 05/02/18 1659 05/02/18 2100 05/03/18 0454 05/03/18 0723 05/03/18 1121  GLUCAP 241* 278* 203* 347* 326*    Microbiology: Results for orders placed or performed during the hospital encounter of 04/25/18  Blood Culture (routine x 2)     Status: None   Collection Time: 04/25/18  3:20 AM  Result Value Ref Range Status   Specimen Description BLOOD RIGHT ANTECUBITAL  Final   Special Requests   Final    BOTTLES DRAWN AEROBIC AND ANAEROBIC Blood Culture results may not be optimal due to an excessive volume of blood received in culture bottles   Culture   Final    NO GROWTH 5 DAYS Performed at [facility], [address] [place], [place] [address]    Report Status 04/30/2018 FINAL  Final  Blood Culture (routine x 2)     Status: None   Collection Time: 04/25/18  3:54 AM  Result Value Ref Range Status   Specimen Description BLOOD BLOOD LEFT HAND  Final   Special Requests   Final    BOTTLES DRAWN AEROBIC AND ANAEROBIC Blood Culture results may not be optimal due to an  excessive volume of blood received in culture bottles   Culture   Final    NO GROWTH 5 DAYS Performed at [facility], [address] [place], [place] [address]    Report Status 04/30/2018 FINAL  Final  Urine culture     Status: None   Collection Time: 04/25/18  6:57 AM  Result Value Ref Range Status   Specimen Description   Final    URINE, RANDOM Performed at [facility], [address] [place], [place] [address]    Special Requests   Final    NONE Performed at [facility], [address], [place], [place] [address]    Culture   Final    NO GROWTH Performed at [facility], [place] [address] [place], [place] [address]    Report Status 04/26/2018 FINAL  Final  MRSA PCR Screening     Status: None   Collection Time: 04/26/18  1:08 AM  Result Value Ref Range Status   MRSA by  PCR NEGATIVE NEGATIVE Final    Comment:        The GeneXpert MRSA Assay (FDA approved for NASAL specimens only), is one component of a comprehensive MRSA colonization surveillance program. It is not intended to diagnose MRSA infection nor to guide or monitor treatment for MRSA infections. Performed at [facility], [address] [place], [place] [address]   Body fluid culture     Status: None   Collection Time: 04/27/18  9:50 AM  Result Value Ref Range Status   Specimen Description   Final    PLEURAL Performed at [facility], [address] [place], [place] [address]    Special Requests   Final    NONE Performed at [facility], [address] [place], [place] [address]    Gram Stain   Final    RARE WBC PRESENT, PREDOMINANTLY PMN NO ORGANISMS SEEN    Culture   Final    NO GROWTH 3 DAYS Performed at [facility], [place] [address], [place], [place] [address]    Report Status 04/30/2018 FINAL  Final  Fungus Culture With Stain     Status: None (Preliminary result)   Collection Time: 04/27/18  9:50 AM  Result Value Ref Range Status   Fungus Stain Final report  Final    Comment: (NOTE) Performed At: [facility] [place], [place] [id number] Rush Farmer MD [id number]    Fungus (Mycology) Culture PENDING  Incomplete   Fungal Source PLEURAL  Final    Comment: Performed at [facility], [address] [place], [place] [address]  Acid Fast Smear (AFB)     Status: None   Collection Time: 04/27/18  9:50 AM  Result Value Ref Range Status   AFB Specimen Processing Concentration  Final   Acid Fast Smear Negative  Final    Comment: (NOTE) Performed At: [facility] [address] [place], [place] [id number] Rush Farmer MD [id number]    Source (AFB) PLEURAL  Final    Comment: Performed at [facility], [place] [place], [place] [address]  Acid Fast Culture with reflexed  sensitivities     Status: None   Collection Time: 04/27/18  9:50 AM  Result Value Ref Range Status   Acid Fast Culture Concentration  Final    Comment: (NOTE) Performed At: [facility] [place], [place] [id number] Rush Farmer MD [id number]    Source of  Sample PLEURAL  Final    Comment: Performed at [facility], [address] [place], [place] [address]  Fungus Culture Result     Status: None   Collection Time: 04/27/18  9:50 AM  Result Value Ref Range Status   Result 1 Comment  Final    Comment: (NOTE) KOH/Calcofluor preparation:  no fungus observed. Performed At: [facility] [place], [place] [id number] Rush Farmer MD [id number]   CULTURE, BLOOD (ROUTINE X 2) w Reflex to ID Panel     Status: None (Preliminary result)   Collection Time: 04/29/18 10:58 AM  Result Value Ref Range Status   Specimen Description BLOOD BLOOD RIGHT HAND  Final   Special Requests   Final    BOTTLES DRAWN AEROBIC AND ANAEROBIC Blood Culture adequate volume   Culture   Final    NO GROWTH 4 DAYS Performed at [facility], [address] [place], [place] [address]    Report Status PENDING  Incomplete  CULTURE, BLOOD (ROUTINE X 2) w Reflex to ID Panel     Status: None (Preliminary result)   Collection Time: 04/29/18 11:05 AM  Result Value Ref Range Status   Specimen Description BLOOD BLOOD LEFT HAND  Final   Special Requests   Final    BOTTLES DRAWN AEROBIC AND ANAEROBIC Blood Culture adequate volume   Culture   Final    NO GROWTH 4 DAYS Performed at [facility], [address], [place], [place] [address]    Report Status PENDING  Incomplete  Acid Fast Smear (AFB)     Status: None   Collection Time: 04/30/18  8:57 AM  Result Value Ref Range Status   AFB Specimen Processing Concentration  Final   Acid Fast Smear Negative  Final    Comment: (NOTE) Performed At: [facility] [place], [place] [id number] Rush Farmer MD [id number]    Source (AFB) BONE MARROW  Final    Comment: Performed at [facility], [address] [place], [place] [address]  Aerobic/Anaerobic Culture (surgical/deep wound)     Status: None (Preliminary result)   Collection Time: 04/30/18  8:59 AM  Result Value Ref Range Status   Specimen Description   Final    BONE MARROW Performed at [facility], [address] [place], [place] [address]    Special Requests   Final    Immunocompromised Performed at [facility], [address] [place], [place] [address]    Gram Stain   Final    FEW WBC PRESENT, PREDOMINANTLY PMN RARE GRAM NEGATIVE RODS    Culture   Final    NO GROWTH 3 DAYS NO ANAEROBES ISOLATED; CULTURE IN PROGRESS FOR 5 DAYS Performed at [facility], [place] [address] [place], [place] [address]    Report Status PENDING  Incomplete    Coagulation Studies: No results for input(s): LABPROT, INR in the last 72 hours.  Urinalysis: No results for input(s): COLORURINE, LABSPEC, PHURINE, GLUCOSEU, HGBUR, BILIRUBINUR, KETONESUR, PROTEINUR, UROBILINOGEN, NITRITE, LEUKOCYTESUR in the last 72 hours.  Invalid input(s): APPERANCEUR    Imaging: Dg Chest 2 View  Result Date: 05/03/2018 CLINICAL DATA:  Pneumonia and sepsis, hypoxia. History of CHF, chronic kidney disease, HIV, cap see sarcoma. EXAM: CHEST - 2 VIEW COMPARISON:  Chest x-ray of April 29, 2018 and chest CT scan of April 29, 2018 FINDINGS: The lungs are borderline hypoinflated. There is left basilar density consistent with atelectasis or infiltrate.  There is a small left pleural effusion and trace right pleural effusion. The cardiac silhouette is mildly enlarged. The pulmonary vascularity is normal. There is calcification in the wall of the aortic arch. The observed bony thorax is unremarkable. IMPRESSION: Left basilar atelectasis or pneumonia perhaps slightly improved today. Persistent  small left and trace right pleural effusions. Thoracic aortic atherosclerosis. Electronically Signed   By: David  Martinique M.D.   On: 05/03/2018 07:59     Medications:   . sodium chloride Stopped (04/30/18 0915)   . abacavir-dolutegravir-lamiVUDine  1 tablet Oral QAC breakfast  . aspirin EC  325 mg Oral Daily  . budesonide (PULMICORT) nebulizer solution  1 mg Nebulization BID  . divalproex  500 mg Oral BID  . enoxaparin (LOVENOX) injection  40 mg Subcutaneous Q24H  . feeding supplement (NEPRO CARB STEADY)  237 mL Oral BID BM  . gabapentin  100 mg Oral TID  . iloperidone  12 mg Oral BID  . insulin aspart  0-15 Units Subcutaneous TID WC  . insulin aspart  0-5 Units Subcutaneous QHS  . insulin aspart  5 Units Subcutaneous TID WC  . insulin glargine  15 Units Subcutaneous QHS  . losartan  25 mg Oral Daily  . metoprolol tartrate  12.5 mg Oral BID  . mirtazapine  30 mg Oral QHS  . pantoprazole  40 mg Oral BID  . rosuvastatin  40 mg Oral Daily  . sucralfate  1 g Oral BID  . venlafaxine XR  75 mg Oral Q breakfast  . cyanocobalamin  1,000 mcg Oral Daily   acetaminophen, benzonatate, bisacodyl, butalbital-acetaminophen-caffeine, chlorpheniramine-HYDROcodone, diazepam, diphenhydrAMINE, guaiFENesin-dextromethorphan, ipratropium-albuterol, nitroGLYCERIN, ondansetron (ZOFRAN) IV, oxyCODONE, senna-docusate  Assessment/ Plan:  [name]. is a 57 y.o. 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   1. 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

## 2018-05-03 NOTE — Progress Notes (Signed)
Patients heart rate in the 50s. MD notifed, per MD hold metoprolol for morning scheduled dose. Will continue to monitor patient.

## 2018-05-03 NOTE — Progress Notes (Signed)
Subjective Says he is feeling better No cough No fever in 96 hrs  Objective:  VITALS:  Patient Vitals for the past 24 hrs:  BP Temp Temp src Pulse Resp SpO2  05/03/18 0723 122/60 - - (!) 47 - -  05/03/18 0721 (!) 149/120 97.9 F (36.6 C) Oral (!) 58 19 99 %  05/03/18 0453 129/71 (!) 97.5 F (36.4 C) Oral (!) 50 19 96 %  05/02/18 1954 129/74 97.8 F (36.6 C) Oral (!) 58 20 98 %  05/02/18 1600 121/77 97.8 F (36.6 C) Oral (!) 55 16 95 %  PHYSICAL EXAM:  General: Alert, cooperative, no distress, appears stated age.  Lungs: b/l air entry, decreased left base Heart: Regular rate and rhythm, no murmur, rub or gallop. Abdomen: Soft, non-tender,not distended. Bowel sounds normal. No masses Extremities: atraumatic, no cyanosis. No edema. No clubbing Skin: No rashes or lesions. Or bruising Lymph: Cervical, supraclavicular normal. Neurologic: Grossly non-focal Pertinent Labs Lab Results CBC    Component Value Date/Time   WBC 5.1 05/02/2018 0548   RBC 3.01 (L) 05/02/2018 0548   HGB 8.7 (L) 05/02/2018 0548   HGB 9.5 (L) 03/23/2018 2341   HCT 27.3 (L) 05/02/2018 0548   HCT 29.5 (L) 03/23/2018 2341   PLT 293 05/02/2018 0548   PLT 156 03/23/2018 2341   MCV 90.7 05/02/2018 0548   MCV 95 03/23/2018 2341   MCV 93 10/23/2014 1719   MCH 28.9 05/02/2018 0548   MCHC 31.9 05/02/2018 0548   RDW 15.4 05/02/2018 0548   RDW 14.9 03/23/2018 2341   RDW 13.0 10/23/2014 1719   LYMPHSABS 1.9 04/30/2018 0422   LYMPHSABS 2.9 03/23/2018 2341   LYMPHSABS 2.3 12/27/2012 0550   MONOABS 0.7 04/30/2018 0422   MONOABS 0.7 12/27/2012 0550   EOSABS 0.1 04/30/2018 0422   EOSABS 0.1 03/23/2018 2341   EOSABS 0.3 12/27/2012 0550   BASOSABS 0.0 04/30/2018 0422   BASOSABS 0.0 03/23/2018 2341   BASOSABS 0.1 12/27/2012 0550    CMP Latest Ref Rng & Units 05/02/2018 04/28/2018 04/27/2018  Glucose 70 - 99 mg/dL 258(H) 117(H) 238(H)  BUN 6 - 20 mg/dL 21(H) 15 17  Creatinine 0.61 - 1.24 mg/dL 1.21 1.52(H)  1.54(H)  Sodium 135 - 145 mmol/L 138 141 140  Potassium 3.5 - 5.1 mmol/L 3.8 3.6 3.5  Chloride 98 - 111 mmol/L 101 105 105  CO2 22 - 32 mmol/L _0 Calcium 8.9 - 10.3 mg/dL 8.7(L) 8.3(L) 7.9(L)  Total Protein 6.5 - 8.1 g/dL 6.2(L) 6.7 -  Total Bilirubin 0.3 - 1.2 mg/dL 0.4 0.4 -  Alkaline Phos 38 - 126 U/L 106 72 -  AST 15 - 41 U/L 19 53(H) -  ALT 0 - 44 U/L 42 35 -     Quantiferon glod from 04/28/18- Negative RPR NR 04/27/18 Pleural fluid afb smear negative 04/30/18 Bone marrow biopsy AFB smear negative  IMAGING RESULTS: ? I  Impression/Recommendation  57 y.o. malewith history of HIV, CHF, chronic kidney disease(FSGN), schizophrenia  is admitted with cough, fever . Pt has had recurrent hospitalization for the same complaint.  ?Recurrent visits to ED and hospitalization with constellation of symptoms like cough, Fever SOB, headache, anxiety Not clear what the underlying pathology is?  He has following positive  findings Fever- none for the past 96 hrs  Anemia since Sept 2019 High ESR > 100 Pericardial effusion  pleural effusion Serum protein electrophoresis shows low M spike 0.4 indeterminate quantiferon gold S/pbone marrow biopsy Neg ANA,  ANCA, normal ferritin, ACE , LDH, CK, legionella neg, blood cultures neg, CMV DNA  Neg , TOXO neg, EBV DNA positive at 3125 ( bot not significant) D.D ? autoimmuneVS malignancy VS infection VS drug induced No TB- quantiferon gold neg, pleural fluid and bone marrow AFB smear both negafb pleural fluid culture  ( pleural fluid analysis  is also not suggestive of pleural TB DC airborne   Appreciate pulmonologist recommendation- adenosine de-aminase pending  parvovirus, CMV, EBV, toxo, RPR, Borrelia Burgdoferi pending CT abdomen -no abscess  Left pleural effusion with atelectasis- He does not have pneumonia ( was treated as one in the last admission and he responded to steroids rather than to antibiotics last  admission)  Pleurocentesis- sent for bacterial, afb- neg    Fluctuating renal function  Adrenal adenoma    Ascending thoracic Aortic aneurysm 4.3 cm?   Afib was on amiodarone-  Discontinued- improved after that _ HIV- well controlled on Triumeq- VL < 20 and cd4 >1000 Pt has been on Triumeq since 2016 and before that Epzicom-  __________________________________________________ Discussed with patient and care team ID will sign off -call if needed

## 2018-05-04 ENCOUNTER — Encounter: Payer: Self-pay | Admitting: Infectious Diseases

## 2018-05-04 ENCOUNTER — Inpatient Hospital Stay: Payer: Self-pay | Admitting: Infectious Diseases

## 2018-05-04 ENCOUNTER — Ambulatory Visit: Payer: Medicare Other | Attending: Infectious Diseases | Admitting: Infectious Diseases

## 2018-05-04 VITALS — BP 132/86 | HR 71 | Ht 67.0 in | Wt 204.0 lb

## 2018-05-04 DIAGNOSIS — Z8709 Personal history of other diseases of the respiratory system: Secondary | ICD-10-CM

## 2018-05-04 DIAGNOSIS — B20 Human immunodeficiency virus [HIV] disease: Secondary | ICD-10-CM

## 2018-05-04 DIAGNOSIS — F200 Paranoid schizophrenia: Secondary | ICD-10-CM

## 2018-05-04 DIAGNOSIS — Z794 Long term (current) use of insulin: Secondary | ICD-10-CM

## 2018-05-04 DIAGNOSIS — Z79899 Other long term (current) drug therapy: Secondary | ICD-10-CM

## 2018-05-04 DIAGNOSIS — D649 Anemia, unspecified: Secondary | ICD-10-CM

## 2018-05-04 DIAGNOSIS — Z888 Allergy status to other drugs, medicaments and biological substances status: Secondary | ICD-10-CM

## 2018-05-04 DIAGNOSIS — E785 Hyperlipidemia, unspecified: Secondary | ICD-10-CM

## 2018-05-04 DIAGNOSIS — E119 Type 2 diabetes mellitus without complications: Secondary | ICD-10-CM | POA: Diagnosis not present

## 2018-05-04 DIAGNOSIS — I509 Heart failure, unspecified: Secondary | ICD-10-CM | POA: Diagnosis not present

## 2018-05-04 DIAGNOSIS — F419 Anxiety disorder, unspecified: Secondary | ICD-10-CM

## 2018-05-04 LAB — CULTURE, BLOOD (ROUTINE X 2)
CULTURE: NO GROWTH
Culture: NO GROWTH
SPECIAL REQUESTS: ADEQUATE
Special Requests: ADEQUATE

## 2018-05-04 LAB — PARVOVIRUS B19 ANTIBODY, IGG AND IGM
PAROVIRUS B19 IGG ABS: 0.6 {index} (ref 0.0–0.8)
Parovirus B19 IgM Abs: 0.1 index (ref 0.0–0.8)

## 2018-05-04 LAB — FUNGAL ANTIBODIES PANEL, ID-BLOOD
ASPERGILLUS NIGER: NEGATIVE
Aspergillus flavus: NEGATIVE
Aspergillus fumigatus, IgG: NEGATIVE
BLASTOMYCES ABS, QN, DID: NEGATIVE
Histoplasma Ab, Immunodiffusion: NEGATIVE

## 2018-05-04 LAB — B. BURGDORFI ANTIBODIES: B burgdorferi Ab IgG+IgM: <0.91 {ISR} (ref 0.00–0.90)

## 2018-05-04 NOTE — Patient Instructions (Signed)
You are here to establish HIV care. You were followed by Dr.Fitzgerald . You currently are taking Triumeq and your last Vl < 20 and cd4 > 1000. Will do labs in 3 months and see you after that. We need to update your vaccination. I will check with your PCP whether you got the folowing vaccines HEPB HEPA TdaP Prevnar

## 2018-05-04 NOTE — Progress Notes (Signed)
NAME: Danny Cook.  DOB: 1960-08-09  MRN: [id number]  Date/Time: 05/04/2018 10:45 AM Subjective:  REASON FOR CONSULT:  ? Danny Cook. is a 57 y.o. male with a history of HIV/DM/CHF/schizophrenia, anxiety is here to establish HIV care. He is a poor historian Says he was diagnosed in the 69s. Last was followed by Dr.Fitzgerald at [facility] clinic. Discharged from hospital yesterday after a week stay for cough, fever , and was worked up for pleural effusion. Was hospitalized before that in Sept for pericardial effusion Current regimen Triumeq Nadir Cd4 [place] OI- says he did not have any( there is mention of spontaneous resolution of KS in the chart)  HAARt history Triumeq since 2016 Prior regimen- epzicom+ reyataz+norvir  Acquired thru sex with men Genotype-NK ? Active problems CKD FSGS Hyperlipidemia Anemia Pericardial effusion  Left plural effusion High ESR HIV Adrenal adenoma  Past Medical History:  Diagnosis Date  . Anemia   . Anxiety   . Arthritis   . CHF (congestive heart failure) ([place])   . Chronic kidney disease    Renal Insufficiency Syndrome; Glomerulosclerosis 2013  . Complication of anesthesia   . Depressed   . Diabetes mellitus without complication ([place])   . High cholesterol   . HIV (human immunodeficiency virus infection) ([place])   . Hypertension   . 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 Paranoid disorder ([place])   . Schizophrenia, paranoid ([place])   . Sleep apnea     Past Surgical History:  Procedure Laterality Date  . COLONOSCOPY WITH PROPOFOL N/A 09/03/2015   Procedure: COLONOSCOPY WITH PROPOFOL;  Surgeon: Lollie Sails, MD;  Location: [facility] ENDOSCOPY;  Service: Endoscopy;  Laterality: N/A;  . DG TEETH FULL    . ESOPHAGOGASTRODUODENOSCOPY (EGD) WITH PROPOFOL N/A 07/01/2016   Procedure: ESOPHAGOGASTRODUODENOSCOPY (EGD) WITH PROPOFOL;  Surgeon: Lollie Sails, MD;  Location[facility] END[facility]COPY;  Service: Endoscopy;  Laterality: N/A;    Social  History   Socioeconomic History  . Marital status: Single    Spouse name: Not on file  . Number of children: Not on file  . Years of education: 30  . Highest education level: GED or equivalent  Occupational History  . Not on file  Social Needs  . Financial resource strain: Not hard at all  . Food insecurity:    Worry: Sometimes true    Inability: Never true  . Transportation needs:    Medical: No    Non-medical: No  Tobacco Use  . Smoking status: Never Smoker  . Smokeless tobacco: Never Used  Substance and Sexual Activity  . Alcohol use: No  . Drug use: No  . Sexual activity: Never  Lifestyle  . Physical activity:    Days per week: 0 days    Minutes per session: 0 min  . Stress: To some extent  Relationships  . Social connections:    Talks on phone: More than three times a week    Gets together: Three times a week    Attends religious service: Never    Active member of club or organization: No    Attends meetings of clubs or organizations: Never    Relationship status: Never married  . Intimate partner violence:    Fear of current or ex partner: Patient refused    Emotionally abused: Patient refused    Physically abused: Patient refused    Forced sexual activity: Patient refused  Other Topics Concern  . Not on file  Social History Narrative  . Not  on file    Family History  Problem Relation Age of Onset  . Heart attack Mother   . Diabetes Mellitus II Mother   . Mental illness Mother   . CAD Mother   . Heart attack Father   . CAD Father   . Hypertension Father    Allergies  Allergen Reactions  . Trazodone And Nefazodone   . Viread [Tenofovir Disoproxil]     Damages kidneys  . Etodolac Rash   ? Current Outpatient Medications  Medication Sig Dispense Refill  . Abacavir-Dolutegravir-Lamivud (TRIUMEQ) 600-50-300 MG TABS Take 1 tablet by mouth daily.    [name] acetaminophen (TYLENOL) 325 MG tablet Take 2 tablets (650 mg total) by mouth every 6 (six) hours as  needed for fever (Fever >101). 10 tablet 0  . albuterol (PROVENTIL HFA;VENTOLIN HFA) 108 (90 Base) MCG/ACT inhaler Inhale 2 puffs into the lungs every 6 (six) hours as needed for wheezing or shortness of breath. 1 Inhaler 2  . aspirin EC 325 MG tablet Take 325 mg by mouth daily.    . benzonatate (TESSALON PERLES) 100 MG capsule Take 1 capsule (100 mg total) by mouth every 6 (six) hours as needed for cough. 30 capsule 0  . butalbital-acetaminophen-caffeine (FIORICET, ESGIC) 50-325-40 MG tablet Take 1-2 tablets by mouth every 6 (six) hours as needed. (Patient taking differently: Take 1-2 tablets by mouth every 6 (six) hours as needed for migraine. ) 20 tablet 0  . chlorpheniramine-HYDROcodone (TUSSIONEX) 10-8 MG/5ML SUER Take 5 mLs by mouth every 12 (twelve) hours as needed for cough. 115 mL 0  . cyanocobalamin 1000 MCG tablet Take 1,000 mcg by mouth daily.    . diazepam (VALIUM) 2 MG tablet Take 1 tablet (2 mg total) by mouth every 8 (eight) hours as needed for anxiety (use for breakthrough anxiety). 30 tablet 0  . diphenhydrAMINE (BENADRYL) 50 MG capsule Take 1 capsule (50 mg total) by mouth at bedtime as needed for sleep. 30 capsule 0  . divalproex (DEPAKOTE ER) 250 MG 24 hr tablet Take 500 mg by mouth 2 (two) times daily.     [name] FANAPT 12 MG TABS Take 12 mg by mouth 2 (two) times daily.     . fluticasone (FLOVENT HFA) 220 MCG/ACT inhaler Inhale 2 puffs into the lungs 2 (two) times daily. Rinse out mouth afterwards 1 Inhaler 12  . gabapentin (NEURONTIN) 100 MG capsule Take 1 capsule (100 mg total) by mouth 3 (three) times daily. 30 capsule 0  . Insulin Degludec (TRESIBA FLEXTOUCH) 200 UNIT/ML SOPN Inject 20 Units into the skin See admin instructions. Daily if blood sugar >85; If bs <85, patient uses victoza    . liraglutide (VICTOZA) 18 MG/3ML SOPN Inject 1.8 mg into the skin See admin instructions. Daily if blood sugar is greater than 85; patient uses tresiba if blood sugar is less than 85    .  losartan (COZAAR) 25 MG tablet Take 1 tablet (25 mg total) by mouth daily. 30 tablet 0  . metoprolol tartrate (LOPRESSOR) 25 MG tablet Take 0.5 tablets (12.5 mg total) by mouth 2 (two) times daily. 30 tablet 0  . mirtazapine (REMERON) 30 MG tablet Take 30 mg by mouth at bedtime.    . nitroGLYCERIN (NITROSTAT) 0.4 MG SL tablet Place 0.4 mg under the tongue every 5 (five) minutes as needed for chest pain.     . Nutritional Supplements (FEEDING SUPPLEMENT, NEPRO CARB STEADY,) LIQD Take 237 mLs by mouth 2 (two) times daily between meals. [place]  Can 0  . omeprazole (PRILOSEC) 40 MG capsule Take 40 mg by mouth 2 (two) times daily.    . promethazine (PHENERGAN) 6.25 MG/5ML syrup Take 6.25 mg by mouth every 6 (six) hours as needed for nausea or vomiting.    . rosuvastatin (CRESTOR) 20 MG tablet Take 40 mg by mouth daily.     . sucralfate (CARAFATE) 1 g tablet Take 1 g by mouth 2 (two) times daily.    [name] triamcinolone cream (KENALOG) 0.1 % Apply 1 application topically 2 (two) times daily as needed. (Patient taking differently: Apply 1 application topically 2 (two) times daily as needed (for rash). ) 30 g 0  . venlafaxine XR (EFFEXOR-XR) 75 MG 24 hr capsule Take 1 capsule (75 mg total) by mouth daily with breakfast. 30 capsule 0   No current facility-administered medications for this visit.     REVIEW OF SYSTEMS:  Const: negative fever, negative chills, negative weight loss Eyes: negative diplopia or visual changes, negative eye pain ENT: negative coryza, negative sore throat Resp: minimal  cough, hemoptysis, dyspnea Cards: negative for chest pain, palpitations, lower extremity edema GU: negative for frequency, dysuria and hematuria Skin: negative for rash and pruritus Heme: negative for easy bruising and gum/nose bleeding MS: negative for myalgias, arthralgias, back pain and muscle weakness Neurolo:headahces and dizziness 4 weeks ago- none now  vertigo, memory problems  Psych:anxiety     Objective:   VITALS:  BP 132/86 (BP Location: Left Arm, Patient Position: Sitting, Cuff Size: Normal)   Pulse 71   Ht 5' 7"  (1.702 m)   Wt 204 lb (92.5 kg)   BMI 31.95 kg/m  PHYSICAL EXAM:  General: Alert, cooperative, no distress, appears stated age.  Head: Normocephalic, without obvious abnormality, atraumatic. Eyes: Conjunctivae clear, anicteric sclerae. Pupils are equal Nose: Nares normal. No drainage or sinus tenderness. Throat: Lips, mucosa, and tongue normal. No Thrush Neck: Supple, symmetrical, no adenopathy, thyroid: non tender no carotid bruit and no JVD. Back: No CVA tenderness. Lungs: b/l air entry- decreased left base Heart: Regular rate and rhythm, no murmur, rub or gallop. Abdomen: Soft, non-tender,not distended. Bowel sounds normal. No masses Extremities: Extremities normal, atraumatic, no cyanosis. No edema. No clubbing Skin: No rashes or lesions. Not Jaundiced Lymph: Cervical, supraclavicular normal. Neurologic: Grossly non-focal Pertinent Labs Vl < 20 and Cd4 > 1000 from  IMAGING RESULTS: Health maintenance Vaccination pneumovac- 23-08/01/17 , 07/06/15 Prevnar-13 HepB HepA TdaP Flu Herpes zoster- RPR HEPC ab GC/CHl Anal PAP Lipid CMv TOXO -IGG/IgM HIV VL Cd4 quantiferon Gold- Neg 04/28/18 Colonoscopy 09/03/15 EGD 07/01/2016 Impression/Recommendation HIV: well controlled- last Cd4 from [place] 2019 is 1134 and Vl < 20 On triumeq-100% adherent  Recent pericardial effusion improved- unclear etiology   High Esr- Neg Autoimmune work up rheumatologist seen patient- no steroids for now Quantiferon Gold from 04/28/18 was negative 9 prior one in Sept 2019 was intermediate)  Recent left pleural effusion- s/p pleurocentesis- Neg Afb, not an exudate  Anemia- new since aug 2019- recent bone marrow(04/30/18) result pending Serum electrophoresis had a small M spike- unclear sugnificance  DM-on liraglutide ( victoza) and [place]  Hyperlipidemia on Rosuvastatin  CHF  -losartan, metoprolol  paranoid schizophrenia  Health maintenance needs to be updated Will check with PCP regarding vaccination status ( HEPB/A,Pnumova, TDAp, Shingrix) ? ?follow up in 3 months ? ___________________________________________________ Discussed with patient, requesting provider

## 2018-05-05 LAB — AEROBIC/ANAEROBIC CULTURE (SURGICAL/DEEP WOUND)

## 2018-05-05 LAB — AEROBIC/ANAEROBIC CULTURE W GRAM STAIN (SURGICAL/DEEP WOUND): Culture: NO GROWTH

## 2018-05-10 ENCOUNTER — Other Ambulatory Visit: Payer: Self-pay | Admitting: Internal Medicine

## 2018-05-10 ENCOUNTER — Ambulatory Visit
Admission: RE | Admit: 2018-05-10 | Discharge: 2018-05-10 | Disposition: A | Discharge status: Home / Self Care | Payer: Medicare Other | Source: Ambulatory Visit | Attending: Internal Medicine | Admitting: Internal Medicine

## 2018-05-10 DIAGNOSIS — R600 Localized edema: Secondary | ICD-10-CM | POA: Diagnosis not present

## 2018-05-11 NOTE — Progress Notes (Signed)
Patient ID: Danny Cook., male    DOB: 05/03/1961, 57 y.o.   MRN: [id number]  HPI  Danny Cook is a 57 y/o male with a history of DM, hyperlipidemia, HTN, CKD, HIV, schizophrenia, obstructive sleep apnea, anxiety, osteoarthritis and chronic heart failure.   Echo report from 03/25/18 reviewed and showed an EF of 55-60%. Echo report from 07/31/17 reviewed and showed an EF of 55-65%. Echo report from 05/11/17 reviewed and showed an EF of 55% along with mild AR/ TR/ Danny.  Admitted 04/25/18 due to pneumonia. IV steroids and antibiotics were given. Cardiology, nephrology, rheumatology and ID consults obtained. Discharged after 8 days. Admitted 11/04/17 due to suicidal thoughts and schizophrenia. Was evaluated by psychiatry and discharged home after 5 days. Danny Cook   He presents today for a follow-up visit with a chief complaint of minimal fatigue upon moderate exertion. He describes this as chronic in nature having been present for several years. He has associated shortness of breath, pedal edema, anxiety and back pain along with this. He denies any difficulty sleeping, abdominal distention, palpitations, chest pain, cough, dizziness or weight gain. Now living at [place] [facility] and says that that is going well for him.   Past Medical History:  Diagnosis Date  . Anemia   . Anxiety   . Arthritis   . CHF (congestive heart failure) ([place])   . Chronic kidney disease    Renal Insufficiency Syndrome; Glomerulosclerosis 2013  . Complication of anesthesia   . Depressed   . Diabetes mellitus without complication ([place])   . High cholesterol   . HIV (human immunodeficiency virus infection) ([place])   . Hypertension   . 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 Paranoid disorder ([place])   . Schizophrenia, paranoid ([place])   . Sleep apnea    Past Surgical History:  Procedure Laterality Date  . COLONOSCOPY WITH PROPOFOL N/A 09/03/2015   Procedure: COLONOSCOPY WITH PROPOFOL;  Surgeon: Lollie Sails, MD;  Location:  [facility] ENDOSCOPY;  Service: Endoscopy;  Laterality: N/A;  . DG TEETH FULL    . ESOPHAGOGASTRODUODENOSCOPY (EGD) WITH PROPOFOL N/A 07/01/2016   Procedure: ESOPHAGOGASTRODUODENOSCOPY (EGD) WITH PROPOFOL;  Surgeon: Lollie Sails, MD;  Location: [facility] ENDOSCOPY;  Service: Endoscopy;  Laterality: N/A;   Family History  Problem Relation Age of Onset  . Heart attack Mother   . Diabetes Mellitus II Mother   . Mental illness Mother   . CAD Mother   . Heart attack Father   . CAD Father   . Hypertension Father    Social History   Tobacco Use  . Smoking status: Never Smoker  . Smokeless tobacco: Never Used  Substance Use Topics  . Alcohol use: No   Allergies  Allergen Reactions  . Trazodone And Nefazodone   . Viread [Tenofovir Disoproxil]     Damages kidneys  . Etodolac Rash   Prior to Admission medications   Medication Sig Start Date End Date Taking? Authorizing Provider  Abacavir-Dolutegravir-Lamivud (TRIUMEQ) 600-50-300 MG TABS Take 1 tablet by mouth daily.   Yes [provider]  acetaminophen (TYLENOL) 325 MG tablet Take 2 tablets (650 mg total) by mouth every 6 (six) hours as needed for fever (Fever >101). 05/03/18  Yes Vaughan Basta, MD  albuterol (PROVENTIL HFA;VENTOLIN HFA) 108 (90 Base) MCG/ACT inhaler Inhale 2 puffs into the lungs every 6 (six) hours as needed for wheezing or shortness of breath. 04/14/18  Yes Loletha Grayer, MD  aspirin EC 325 MG tablet Take  325 mg by mouth daily.   Yes [provider]  benzonatate (TESSALON PERLES) 100 MG capsule Take 1 capsule (100 mg total) by mouth every 6 (six) hours as needed for cough. 04/24/18 04/24/19 Yes McShane, Gerda Diss, MD  bumetanide (BUMEX) 1 MG tablet Take 1 mg by mouth every other day.   Yes [provider]  butalbital-acetaminophen-caffeine (FIORICET, ESGIC) 50-325-40 MG tablet Take 1-2 tablets by mouth every 6 (six) hours as needed. Patient taking differently: Take 1-2 tablets by mouth every  6 (six) hours as needed for migraine.  01/31/18 01/31/19 Yes Earleen Newport, MD  cyanocobalamin 1000 MCG tablet Take 1,000 mcg by mouth daily.   Yes [provider]  diazepam (VALIUM) 2 MG tablet Take 1 tablet (2 mg total) by mouth every 8 (eight) hours as needed for anxiety (use for breakthrough anxiety). 05/03/18  Yes Vaughan Basta, MD  diphenhydrAMINE (BENADRYL) 50 MG capsule Take 1 capsule (50 mg total) by mouth at bedtime as needed for sleep. 11/09/17  Yes McNew, Tyson Babinski, MD  divalproex (DEPAKOTE ER) 250 MG 24 hr tablet Take 500 mg by mouth 2 (two) times daily.    Yes [provider]  FANAPT 12 MG TABS Take 12 mg by mouth 2 (two) times daily.  11/14/16  Yes [provider]  feeding supplement, GLUCERNA SHAKE, (GLUCERNA SHAKE) LIQD Take 237 mLs by mouth daily.   Yes [provider]  ferrous sulfate 325 (65 FE) MG tablet Take 325 mg by mouth daily with breakfast.   Yes [provider]  fluticasone (FLONASE) 50 MCG/ACT nasal spray Place 2 sprays into both nostrils daily as needed for allergies.   Yes [provider]  fluticasone (FLOVENT HFA) 220 MCG/ACT inhaler Inhale 2 puffs into the lungs 2 (two) times daily. Rinse out mouth afterwards 04/14/18  Yes Wieting, Richard, MD  gabapentin (NEURONTIN) 100 MG capsule Take 1 capsule (100 mg total) by mouth 3 (three) times daily. Patient taking differently: Take 300 mg by mouth 3 (three) times daily.  05/03/18  Yes Vaughan Basta, MD  guaiFENesin-codeine [facility]) 100-10 MG/5ML syrup Take 5 mLs by mouth every 6 (six) hours as needed for cough.   Yes [provider]  Insulin Degludec (TRESIBA FLEXTOUCH) 200 UNIT/ML SOPN Inject 86 Units into the skin See admin instructions. Daily if blood sugar >85; If bs <85, patient uses victoza   Yes [provider]  liraglutide (VICTOZA) 18 MG/3ML SOPN Inject 1.8 mg into the skin See admin instructions. Daily if blood sugar is  greater than 85; patient uses tresiba if blood sugar is less than 85   Yes [provider]  losartan (COZAAR) 25 MG tablet Take 1 tablet (25 mg total) by mouth daily. 05/04/18  Yes Vaughan Basta, MD  metoprolol tartrate (LOPRESSOR) 25 MG tablet Take 0.5 tablets (12.5 mg total) by mouth 2 (two) times daily. 05/03/18  Yes Vaughan Basta, MD  mirtazapine (REMERON) 30 MG tablet Take 30 mg by mouth at bedtime. 04/21/18  Yes [provider]  nitroGLYCERIN (NITROSTAT) 0.4 MG SL tablet Place 0.4 mg under the tongue every 5 (five) minutes as needed for chest pain.    Yes [provider]  omeprazole (PRILOSEC) 40 MG capsule Take 40 mg by mouth 2 (two) times daily.   Yes [provider]  promethazine (PHENERGAN) 6.25 MG/5ML syrup Take 6.25 mg by mouth every 6 (six) hours as needed for nausea or vomiting.   Yes [provider]  rosuvastatin (  CRESTOR) 20 MG tablet Take 40 mg by mouth daily.    Yes [provider]  triamcinolone cream (KENALOG) 0.1 % Apply 1 application topically 2 (two) times daily as needed. Patient taking differently: Apply 1 application topically 2 (two) times daily as needed (for rash).  11/16/16  Yes Little, Traci M, PA-C  venlafaxine XR (EFFEXOR-XR) 75 MG 24 hr capsule Take 1 capsule (75 mg total) by mouth daily with breakfast. 11/09/17  Yes McNew, Tyson Babinski, MD  chlorpheniramine-HYDROcodone (TUSSIONEX) 10-8 MG/5ML SUER Take 5 mLs by mouth every 12 (twelve) hours as needed for cough. Patient not taking: Reported on 05/12/2018 04/14/18   Loletha Grayer, MD  Nutritional Supplements (FEEDING SUPPLEMENT, NEPRO CARB STEADY,) LIQD Take 237 mLs by mouth 2 (two) times daily between meals. Patient not taking: Reported on 05/12/2018 03/27/18   Loletha Grayer, MD  sucralfate (CARAFATE) 1 g tablet Take 1 g by mouth 2 (two) times daily. 04/21/18   [provider]    Review of Systems  Constitutional: Positive for fatigue  (improving). Negative for appetite change.  HENT: Negative for congestion, postnasal drip and sore throat.   Eyes: Negative.   Respiratory: Positive for shortness of breath (rarely). Negative for cough and chest tightness.   Cardiovascular: Positive for leg swelling. Negative for chest pain and palpitations.  Gastrointestinal: Negative for abdominal distention and abdominal pain.  Endocrine: Negative.   Genitourinary: Negative.   Musculoskeletal: Positive for back pain (when standing for long periods of time). Negative for neck pain.  Skin: Negative.   Allergic/Immunologic: Negative.   Neurological: Negative for dizziness and light-headedness.  Hematological: Negative for adenopathy. Does not bruise/bleed easily.  Psychiatric/Behavioral: Positive for dysphoric mood. Negative for sleep disturbance. The patient is nervous/anxious.    Vitals:   05/12/18 1035  BP: (!) 142/93  Pulse: 63  Resp: 18  SpO2: 97%  Weight: 213 lb (96.6 kg)  Height: 5\' 7"  (1.702 m)   Wt Readings from Last 3 Encounters:  05/12/18 213 lb (96.6 kg)  05/04/18 204 lb (92.5 kg)  04/26/18 205 lb (93 kg)   Lab Results  Component Value Date   CREATININE 1.21 05/02/2018   CREATININE 1.52 (H) 04/28/2018   CREATININE 1.54 (H) 04/27/2018    Physical Exam  Constitutional: He is oriented to person, place, and time. He appears well-developed and well-nourished.  HENT:  Head: Normocephalic and atraumatic.  Neck: Normal range of motion. Neck supple. No JVD present.  Cardiovascular: Normal rate and regular rhythm.  Pulmonary/Chest: Effort normal. He has no wheezes. He has no rales.  Abdominal: Soft. He exhibits no distension. There is no tenderness.  Musculoskeletal: He exhibits edema (2+ pitting left lower leg/ 1+ pitting right lower leg). He exhibits no tenderness.  Neurological: He is alert and oriented to person, place, and time.  Skin: Skin is warm and dry.  Psychiatric: He has a normal mood and affect. His  behavior is normal. Thought content normal.  Nursing note and vitals reviewed.  Assessment & Plan:  1: Chronic heart failure with preserved ejection fraction- - NYHA class II - minimally fluid overloaded today with pedal edema - was started on every other day bumex not long ago - weighing daily; reminded to call for an overnight weight gain of >2 pounds or a weekly weight gain of >5 pounds - weight down 11 pounds from last visit 3 1/2 months ago - not adding salt to his food and doesn't think the food at the family care home tastes salty -  saw cardiology Minette Brine) 04/05/18 - BNP 03/29/18 was 235.0 - PharmD reconciled medications with the patient - patient reports receiving his flu vaccine for this season  2: HTN- - BP looks ok today, slightly elevated - BMP from 05/02/18 reviewed and showed sodium 138, potassium 3.8, creatinine 1.21 and GFR >60 - saw PCP ([place]) 05/10/18  3: Diabetes- - nonfasting glucose this morning was 362 - A1c 11/05/17 was 7.8% - saw endocrinologist Jenetta DownerDina Rich) 10/21/17 - saw nephrologist ([place]) 05/10/18  4: Lymphedema- - stage 2 - does elevate his legs at times and he was encouraged to elevate his legs when sitting for long periods of time - order written for him to get compression socks and wear them daily with removal at bedtime - limited in his ability to exercise due to now living in a family care home - consider lymphapress compression boots if edema persists  Facility medication list was reviewed.  Return in 4 months or sooner for any questions/problems before then.

## 2018-05-12 ENCOUNTER — Encounter: Payer: Self-pay | Admitting: Family

## 2018-05-12 ENCOUNTER — Ambulatory Visit: Payer: Medicare Other | Attending: Family | Admitting: Family

## 2018-05-12 ENCOUNTER — Encounter (HOSPITAL_COMMUNITY): Payer: Self-pay | Admitting: Oncology

## 2018-05-12 VITALS — BP 142/93 | HR 63 | Resp 18 | Ht 67.0 in | Wt 213.0 lb

## 2018-05-12 DIAGNOSIS — F200 Paranoid schizophrenia: Secondary | ICD-10-CM | POA: Insufficient documentation

## 2018-05-12 DIAGNOSIS — E785 Hyperlipidemia, unspecified: Secondary | ICD-10-CM | POA: Insufficient documentation

## 2018-05-12 DIAGNOSIS — Z7951 Long term (current) use of inhaled steroids: Secondary | ICD-10-CM | POA: Insufficient documentation

## 2018-05-12 DIAGNOSIS — Z79899 Other long term (current) drug therapy: Secondary | ICD-10-CM | POA: Diagnosis not present

## 2018-05-12 DIAGNOSIS — M1990 Unspecified osteoarthritis, unspecified site: Secondary | ICD-10-CM | POA: Diagnosis not present

## 2018-05-12 DIAGNOSIS — B20 Human immunodeficiency virus [HIV] disease: Secondary | ICD-10-CM | POA: Insufficient documentation

## 2018-05-12 DIAGNOSIS — G4733 Obstructive sleep apnea (adult) (pediatric): Secondary | ICD-10-CM | POA: Diagnosis not present

## 2018-05-12 DIAGNOSIS — E1122 Type 2 diabetes mellitus with diabetic chronic kidney disease: Secondary | ICD-10-CM | POA: Diagnosis not present

## 2018-05-12 DIAGNOSIS — N189 Chronic kidney disease, unspecified: Secondary | ICD-10-CM | POA: Insufficient documentation

## 2018-05-12 DIAGNOSIS — N183 Chronic kidney disease, stage 3 unspecified: Secondary | ICD-10-CM

## 2018-05-12 DIAGNOSIS — I5032 Chronic diastolic (congestive) heart failure: Secondary | ICD-10-CM

## 2018-05-12 DIAGNOSIS — Z794 Long term (current) use of insulin: Secondary | ICD-10-CM | POA: Diagnosis not present

## 2018-05-12 DIAGNOSIS — E7800 Pure hypercholesterolemia, unspecified: Secondary | ICD-10-CM | POA: Diagnosis not present

## 2018-05-12 DIAGNOSIS — I130 Hypertensive heart and chronic kidney disease with heart failure and stage 1 through stage 4 chronic kidney disease, or unspecified chronic kidney disease: Secondary | ICD-10-CM | POA: Diagnosis not present

## 2018-05-12 DIAGNOSIS — Z7982 Long term (current) use of aspirin: Secondary | ICD-10-CM | POA: Insufficient documentation

## 2018-05-12 DIAGNOSIS — Z8249 Family history of ischemic heart disease and other diseases of the circulatory system: Secondary | ICD-10-CM | POA: Diagnosis not present

## 2018-05-12 DIAGNOSIS — I890 Lymphedema, not elsewhere classified: Secondary | ICD-10-CM | POA: Insufficient documentation

## 2018-05-12 DIAGNOSIS — F329 Major depressive disorder, single episode, unspecified: Secondary | ICD-10-CM | POA: Insufficient documentation

## 2018-05-12 DIAGNOSIS — I509 Heart failure, unspecified: Secondary | ICD-10-CM | POA: Diagnosis present

## 2018-05-12 DIAGNOSIS — I10 Essential (primary) hypertension: Secondary | ICD-10-CM

## 2018-05-12 DIAGNOSIS — Z833 Family history of diabetes mellitus: Secondary | ICD-10-CM | POA: Diagnosis not present

## 2018-05-12 NOTE — Patient Instructions (Addendum)
Continue weighing daily and call for an overnight weight gain of > 2 pounds or a weekly weight gain of >5 pounds.  Get compression socks and apply them daily with removal at bedtime

## 2018-05-27 LAB — FUNGUS CULTURE RESULT

## 2018-05-27 LAB — FUNGAL ORGANISM REFLEX

## 2018-05-27 LAB — FUNGUS CULTURE WITH STAIN

## 2018-06-03 LAB — FUNGUS CULTURE WITH STAIN

## 2018-06-03 LAB — FUNGUS CULTURE RESULT

## 2018-06-03 LAB — FUNGAL ORGANISM REFLEX

## 2018-06-07 ENCOUNTER — Inpatient Hospital Stay
Admission: EM | Admit: 2018-06-07 | Discharge: 2018-06-08 | DRG: 311 | Disposition: A | Discharge status: Home / Self Care | Payer: Medicare Other | Attending: Internal Medicine | Admitting: Internal Medicine

## 2018-06-07 ENCOUNTER — Other Ambulatory Visit: Payer: Self-pay

## 2018-06-07 DIAGNOSIS — Z833 Family history of diabetes mellitus: Secondary | ICD-10-CM

## 2018-06-07 DIAGNOSIS — Z7982 Long term (current) use of aspirin: Secondary | ICD-10-CM | POA: Diagnosis not present

## 2018-06-07 DIAGNOSIS — Z794 Long term (current) use of insulin: Secondary | ICD-10-CM | POA: Diagnosis not present

## 2018-06-07 DIAGNOSIS — E785 Hyperlipidemia, unspecified: Secondary | ICD-10-CM | POA: Diagnosis present

## 2018-06-07 DIAGNOSIS — B20 Human immunodeficiency virus [HIV] disease: Secondary | ICD-10-CM | POA: Diagnosis not present

## 2018-06-07 DIAGNOSIS — C469 Kaposi's sarcoma, unspecified: Secondary | ICD-10-CM | POA: Diagnosis not present

## 2018-06-07 DIAGNOSIS — F419 Anxiety disorder, unspecified: Secondary | ICD-10-CM | POA: Diagnosis not present

## 2018-06-07 DIAGNOSIS — Z6833 Body mass index (BMI) 33.0-33.9, adult: Secondary | ICD-10-CM | POA: Diagnosis not present

## 2018-06-07 DIAGNOSIS — I130 Hypertensive heart and chronic kidney disease with heart failure and stage 1 through stage 4 chronic kidney disease, or unspecified chronic kidney disease: Secondary | ICD-10-CM | POA: Diagnosis not present

## 2018-06-07 DIAGNOSIS — D472 Monoclonal gammopathy: Secondary | ICD-10-CM | POA: Diagnosis not present

## 2018-06-07 DIAGNOSIS — I5032 Chronic diastolic (congestive) heart failure: Secondary | ICD-10-CM | POA: Diagnosis not present

## 2018-06-07 DIAGNOSIS — Z7951 Long term (current) use of inhaled steroids: Secondary | ICD-10-CM

## 2018-06-07 DIAGNOSIS — D649 Anemia, unspecified: Secondary | ICD-10-CM | POA: Diagnosis not present

## 2018-06-07 DIAGNOSIS — N183 Chronic kidney disease, stage 3 (moderate): Secondary | ICD-10-CM | POA: Diagnosis not present

## 2018-06-07 DIAGNOSIS — Z888 Allergy status to other drugs, medicaments and biological substances status: Secondary | ICD-10-CM

## 2018-06-07 DIAGNOSIS — Z8249 Family history of ischemic heart disease and other diseases of the circulatory system: Secondary | ICD-10-CM

## 2018-06-07 DIAGNOSIS — F200 Paranoid schizophrenia: Secondary | ICD-10-CM | POA: Diagnosis present

## 2018-06-07 DIAGNOSIS — E7800 Pure hypercholesterolemia, unspecified: Secondary | ICD-10-CM | POA: Diagnosis present

## 2018-06-07 DIAGNOSIS — Z818 Family history of other mental and behavioral disorders: Secondary | ICD-10-CM | POA: Diagnosis not present

## 2018-06-07 DIAGNOSIS — E669 Obesity, unspecified: Secondary | ICD-10-CM | POA: Diagnosis not present

## 2018-06-07 DIAGNOSIS — Z79899 Other long term (current) drug therapy: Secondary | ICD-10-CM | POA: Diagnosis not present

## 2018-06-07 DIAGNOSIS — R531 Weakness: Secondary | ICD-10-CM | POA: Diagnosis present

## 2018-06-07 DIAGNOSIS — G4733 Obstructive sleep apnea (adult) (pediatric): Secondary | ICD-10-CM | POA: Diagnosis present

## 2018-06-07 DIAGNOSIS — I214 Non-ST elevation (NSTEMI) myocardial infarction: Secondary | ICD-10-CM | POA: Diagnosis present

## 2018-06-07 DIAGNOSIS — I248 Other forms of acute ischemic heart disease: Secondary | ICD-10-CM | POA: Diagnosis not present

## 2018-06-07 DIAGNOSIS — E1122 Type 2 diabetes mellitus with diabetic chronic kidney disease: Secondary | ICD-10-CM | POA: Diagnosis not present

## 2018-06-07 LAB — URINALYSIS, COMPLETE (UACMP) WITH MICROSCOPIC
BACTERIA UA: NONE SEEN
Bilirubin Urine: NEGATIVE
Glucose, UA: NEGATIVE mg/dL
Hgb urine dipstick: NEGATIVE
Ketones, ur: NEGATIVE mg/dL
LEUKOCYTES UA: NEGATIVE
Nitrite: NEGATIVE
PROTEIN: 100 mg/dL — AB
Specific Gravity, Urine: 1.018 (ref 1.005–1.030)
pH: 5 (ref 5.0–8.0)

## 2018-06-07 LAB — CBC WITH DIFFERENTIAL/PLATELET
Abs Immature Granulocytes: 0.07 10*3/uL (ref 0.00–0.07)
Basophils Absolute: 0.1 10*3/uL (ref 0.0–0.1)
Basophils Relative: 1 %
Eosinophils Absolute: 0.2 10*3/uL (ref 0.0–0.5)
Eosinophils Relative: 1 %
HCT: 36 % — ABNORMAL LOW (ref 39.0–52.0)
HEMOGLOBIN: 11.8 g/dL — AB (ref 13.0–17.0)
Immature Granulocytes: 1 %
Lymphocytes Relative: 25 %
Lymphs Abs: 2.9 10*3/uL (ref 0.7–4.0)
MCH: 29.8 pg (ref 26.0–34.0)
MCHC: 32.8 g/dL (ref 30.0–36.0)
MCV: 90.9 fL (ref 80.0–100.0)
MONOS PCT: 10 %
Monocytes Absolute: 1.1 10*3/uL — ABNORMAL HIGH (ref 0.1–1.0)
Neutro Abs: 7.1 10*3/uL (ref 1.7–7.7)
Neutrophils Relative %: 62 %
Platelets: 222 10*3/uL (ref 150–400)
RBC: 3.96 MIL/uL — ABNORMAL LOW (ref 4.22–5.81)
RDW: 15.6 % — ABNORMAL HIGH (ref 11.5–15.5)
WBC: 11.3 10*3/uL — ABNORMAL HIGH (ref 4.0–10.5)
nRBC: 0 % (ref 0.0–0.2)

## 2018-06-07 LAB — PROTIME-INR
INR: 0.91
Prothrombin Time: 12.2 seconds (ref 11.4–15.2)

## 2018-06-07 LAB — TROPONIN I: TROPONIN I: 0.35 ng/mL — AB (ref <0.03)

## 2018-06-07 LAB — COMPREHENSIVE METABOLIC PANEL
ALT: 13 U/L (ref 0–44)
AST: 36 U/L (ref 15–41)
Albumin: 3.5 g/dL (ref 3.5–5.0)
Alkaline Phosphatase: 85 U/L (ref 38–126)
Anion gap: 13 (ref 5–15)
BILIRUBIN TOTAL: 0.9 mg/dL (ref 0.3–1.2)
BUN: 22 mg/dL — ABNORMAL HIGH (ref 6–20)
CO2: 23 mmol/L (ref 22–32)
CREATININE: 1.77 mg/dL — AB (ref 0.61–1.24)
Calcium: 8.7 mg/dL — ABNORMAL LOW (ref 8.9–10.3)
Chloride: 100 mmol/L (ref 98–111)
GFR calc Af Amer: 48 mL/min — ABNORMAL LOW (ref >60)
GFR, EST NON AFRICAN AMERICAN: 41 mL/min — AB (ref >60)
Glucose, Bld: 140 mg/dL — ABNORMAL HIGH (ref 70–99)
Potassium: 4.2 mmol/L (ref 3.5–5.1)
Sodium: 136 mmol/L (ref 135–145)
Total Protein: 7.2 g/dL (ref 6.5–8.1)

## 2018-06-07 MED ORDER — HEPARIN (PORCINE) 25000 UT/250ML-% IV SOLN
1500.0000 [IU]/h | INTRAVENOUS | Status: DC
  Administered 2018-06-07: 1200 [IU]/h via INTRAVENOUS
  Filled 2018-06-07: qty 250

## 2018-06-07 MED ORDER — SODIUM CHLORIDE 0.9 % IV BOLUS
500.0000 mL | Freq: Once | INTRAVENOUS | Status: AC
  Administered 2018-06-07: 500 mL via INTRAVENOUS

## 2018-06-07 MED ORDER — HEPARIN BOLUS VIA INFUSION
4000.0000 [IU] | Freq: Once | INTRAVENOUS | Status: AC
  Administered 2018-06-07: 4000 [IU] via INTRAVENOUS
  Filled 2018-06-07: qty 4000

## 2018-06-07 MED ORDER — ONDANSETRON HCL 4 MG/2ML IJ SOLN
4.0000 mg | Freq: Once | INTRAMUSCULAR | Status: AC
  Administered 2018-06-07: 4 mg via INTRAVENOUS
  Filled 2018-06-07: qty 2

## 2018-06-07 NOTE — ED Notes (Signed)
Charge nurse notified of troponin 0.35

## 2018-06-07 NOTE — ED Triage Notes (Addendum)
Pt reports fever for 2 days but has not taken temperature Pt c/o headaches, lightheadedness, weakness - c/o nausea

## 2018-06-07 NOTE — Progress Notes (Signed)
ANTICOAGULATION CONSULT NOTE - Initial Consult  Pharmacy Consult for heparin Indication: chest pain/ACS  Allergies  Allergen Reactions  . Trazodone And Nefazodone   . Viread [Tenofovir Disoproxil]     Damages kidneys  . Etodolac Rash    Patient Measurements: Height: 5\' 7"  (170.2 cm) Weight: 215 lb (97.5 kg) IBW/kg (Calculated) : 66.1 Heparin Dosing Weight: 87 kg  Vital Signs: Temp: 98.3 F (36.8 C) (12/09 1949) Temp Source: Oral (12/09 1949) BP: 95/67 (12/09 2050) Pulse Rate: 88 (12/09 2050)  Labs: Recent Labs    06/07/18 1953  HGB 11.8*  HCT 36.0*  PLT 222  CREATININE 1.77*  TROPONINI 0.35*    Estimated Creatinine Clearance: 50 mL/min (A) (by C-G formula based on SCr of 1.77 mg/dL (H)).   Medical History: Past Medical History:  Diagnosis Date  . Anemia   . Anxiety   . Arthritis   . CHF (congestive heart failure) ([place])   . Chronic kidney disease    Renal Insufficiency Syndrome; Glomerulosclerosis 2013  . Complication of anesthesia   . Depressed   . Diabetes mellitus without complication ([place])   . High cholesterol   . HIV (human immunodeficiency virus infection) ([place])   . Hypertension   . 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 Paranoid disorder ([place])   . Schizophrenia, paranoid ([place])   . Sleep apnea     Medications:  Scheduled:  . heparin  4,000 Units Intravenous Once  . ondansetron (ZOFRAN) IV  4 mg Intravenous Once    Assessment: Patient arrived w/ CP w/ initial trops of 0.35 and EKG showing ST elevation in leads II and III. Patient is being started on a heparin drip no PTA anticoagulation.  Goal of Therapy:  Heparin level 0.3-0.7 units/ml Monitor platelets by anticoagulation protocol: Yes   Plan:  Will bolus w/ heparin 4000 units IV x 1 Will start drip @ 1200 units/hr  Will check anti-Xa w/ am labs Baseline labs ordered Will monitor daily CBC's and adjust per anti-Xa levels  Tobie Lords, PharmD, BCPS Clinical Pharmacist 06/07/2018

## 2018-06-08 ENCOUNTER — Other Ambulatory Visit: Payer: Self-pay

## 2018-06-08 ENCOUNTER — Inpatient Hospital Stay
Admit: 2018-06-08 | Discharge: 2018-06-08 | Disposition: A | Discharge status: Home / Self Care | Payer: Medicare Other | Attending: Internal Medicine | Admitting: Internal Medicine

## 2018-06-08 DIAGNOSIS — R531 Weakness: Secondary | ICD-10-CM | POA: Diagnosis not present

## 2018-06-08 DIAGNOSIS — I248 Other forms of acute ischemic heart disease: Secondary | ICD-10-CM | POA: Diagnosis not present

## 2018-06-08 LAB — CBC
HCT: 32.8 % — ABNORMAL LOW (ref 39.0–52.0)
Hemoglobin: 10.4 g/dL — ABNORMAL LOW (ref 13.0–17.0)
MCH: 29.5 pg (ref 26.0–34.0)
MCHC: 31.7 g/dL (ref 30.0–36.0)
MCV: 92.9 fL (ref 80.0–100.0)
Platelets: 210 10*3/uL (ref 150–400)
RBC: 3.53 MIL/uL — ABNORMAL LOW (ref 4.22–5.81)
RDW: 15.6 % — ABNORMAL HIGH (ref 11.5–15.5)
WBC: 8.3 10*3/uL (ref 4.0–10.5)
nRBC: 0 % (ref 0.0–0.2)

## 2018-06-08 LAB — BASIC METABOLIC PANEL
Anion gap: 9 (ref 5–15)
BUN: 19 mg/dL (ref 6–20)
CO2: 28 mmol/L (ref 22–32)
Calcium: 8.1 mg/dL — ABNORMAL LOW (ref 8.9–10.3)
Chloride: 103 mmol/L (ref 98–111)
Creatinine, Ser: 1.43 mg/dL — ABNORMAL HIGH (ref 0.61–1.24)
GFR calc Af Amer: >60 mL/min (ref >60)
GFR calc non Af Amer: 53 mL/min — ABNORMAL LOW (ref >60)
Glucose, Bld: 81 mg/dL (ref 70–99)
POTASSIUM: 3.5 mmol/L (ref 3.5–5.1)
SODIUM: 140 mmol/L (ref 135–145)

## 2018-06-08 LAB — ECHOCARDIOGRAM COMPLETE
Height: 67 in
Weight: 3440 oz

## 2018-06-08 LAB — MRSA PCR SCREENING: MRSA by PCR: NEGATIVE

## 2018-06-08 LAB — GLUCOSE, CAPILLARY
Glucose-Capillary: 88 mg/dL (ref 70–99)
Glucose-Capillary: 68 mg/dL — ABNORMAL LOW (ref 70–99)
Glucose-Capillary: 72 mg/dL (ref 70–99)
Glucose-Capillary: 64 mg/dL — ABNORMAL LOW (ref 70–99)
Glucose-Capillary: 81 mg/dL (ref 70–99)

## 2018-06-08 LAB — APTT: aPTT: 91 seconds — ABNORMAL HIGH (ref 24–36)

## 2018-06-08 LAB — HEPARIN LEVEL (UNFRACTIONATED): Heparin Unfractionated: 0.11 IU/mL — ABNORMAL LOW (ref 0.30–0.70)

## 2018-06-08 LAB — TROPONIN I: Troponin I: 0.26 ng/mL (ref <0.03)

## 2018-06-08 MED ORDER — BUDESONIDE 0.25 MG/2ML IN SUSP
0.2500 mg | Freq: Two times a day (BID) | RESPIRATORY_TRACT | Status: DC
  Administered 2018-06-08: 0.25 mg via RESPIRATORY_TRACT
  Filled 2018-06-08: qty 2

## 2018-06-08 MED ORDER — METOPROLOL TARTRATE 25 MG PO TABS
12.5000 mg | ORAL_TABLET | Freq: Two times a day (BID) | ORAL | Status: DC
  Administered 2018-06-08 (×2): 12.5 mg via ORAL
  Filled 2018-06-08 (×2): qty 1

## 2018-06-08 MED ORDER — BUTALBITAL-APAP-CAFFEINE 50-325-40 MG PO TABS: 1.0000 | ORAL_TABLET | Freq: Four times a day (QID) | ORAL | Status: DC | PRN

## 2018-06-08 MED ORDER — INSULIN ASPART 100 UNIT/ML ~~LOC~~ SOLN: 0.0000 [IU] | Freq: Every day | SUBCUTANEOUS | Status: DC

## 2018-06-08 MED ORDER — ABACAVIR-DOLUTEGRAVIR-LAMIVUD 600-50-300 MG PO TABS
1.0000 | ORAL_TABLET | Freq: Every day | ORAL | Status: DC
  Administered 2018-06-08: 1 via ORAL
  Filled 2018-06-08: qty 1

## 2018-06-08 MED ORDER — ONDANSETRON HCL 4 MG/2ML IJ SOLN: 4.0000 mg | Freq: Four times a day (QID) | INTRAMUSCULAR | Status: DC | PRN

## 2018-06-08 MED ORDER — LIRAGLUTIDE 18 MG/3ML ~~LOC~~ SOPN: 1.8000 mg | PEN_INJECTOR | SUBCUTANEOUS | Status: DC

## 2018-06-08 MED ORDER — FERROUS SULFATE 325 (65 FE) MG PO TABS
325.0000 mg | ORAL_TABLET | Freq: Every day | ORAL | Status: DC
  Administered 2018-06-08: 325 mg via ORAL
  Filled 2018-06-08: qty 1

## 2018-06-08 MED ORDER — HEPARIN BOLUS VIA INFUSION
2600.0000 [IU] | Freq: Once | INTRAVENOUS | Status: AC
  Administered 2018-06-08: 2600 [IU] via INTRAVENOUS
  Filled 2018-06-08: qty 2600

## 2018-06-08 MED ORDER — DOCUSATE SODIUM 100 MG PO CAPS
100.0000 mg | ORAL_CAPSULE | Freq: Two times a day (BID) | ORAL | Status: DC
  Administered 2018-06-08 (×2): 100 mg via ORAL
  Filled 2018-06-08 (×2): qty 1

## 2018-06-08 MED ORDER — GABAPENTIN 300 MG PO CAPS
300.0000 mg | ORAL_CAPSULE | Freq: Three times a day (TID) | ORAL | Status: DC
  Administered 2018-06-08: 300 mg via ORAL
  Filled 2018-06-08: qty 1

## 2018-06-08 MED ORDER — DIAZEPAM 2 MG PO TABS
2.0000 mg | ORAL_TABLET | Freq: Three times a day (TID) | ORAL | Status: DC | PRN
  Administered 2018-06-08: 2 mg via ORAL
  Filled 2018-06-08: qty 1

## 2018-06-08 MED ORDER — HYDROCODONE-ACETAMINOPHEN 5-325 MG PO TABS: 1.0000 | ORAL_TABLET | ORAL | Status: DC | PRN

## 2018-06-08 MED ORDER — DIVALPROEX SODIUM ER 500 MG PO TB24
500.0000 mg | ORAL_TABLET | Freq: Two times a day (BID) | ORAL | Status: DC
  Administered 2018-06-08 (×2): 500 mg via ORAL
  Filled 2018-06-08 (×3): qty 1

## 2018-06-08 MED ORDER — ACETAMINOPHEN 325 MG PO TABS: 650.0000 mg | ORAL_TABLET | Freq: Four times a day (QID) | ORAL | Status: DC | PRN

## 2018-06-08 MED ORDER — DIPHENHYDRAMINE HCL 25 MG PO CAPS: 50.0000 mg | ORAL_CAPSULE | Freq: Every evening | ORAL | Status: DC | PRN

## 2018-06-08 MED ORDER — ALBUTEROL SULFATE (2.5 MG/3ML) 0.083% IN NEBU: 2.5000 mg | INHALATION_SOLUTION | Freq: Four times a day (QID) | RESPIRATORY_TRACT | Status: DC | PRN

## 2018-06-08 MED ORDER — VITAMIN B-12 1000 MCG PO TABS
1000.0000 ug | ORAL_TABLET | Freq: Every day | ORAL | Status: DC
  Administered 2018-06-08: 1000 ug via ORAL
  Filled 2018-06-08: qty 1

## 2018-06-08 MED ORDER — INSULIN ASPART 100 UNIT/ML ~~LOC~~ SOLN: 0.0000 [IU] | Freq: Three times a day (TID) | SUBCUTANEOUS | Status: DC

## 2018-06-08 MED ORDER — VENLAFAXINE HCL ER 75 MG PO CP24
75.0000 mg | ORAL_CAPSULE | Freq: Every day | ORAL | Status: DC
  Administered 2018-06-08: 75 mg via ORAL
  Filled 2018-06-08: qty 1

## 2018-06-08 MED ORDER — ACETAMINOPHEN 650 MG RE SUPP: 650.0000 mg | Freq: Four times a day (QID) | RECTAL | Status: DC | PRN

## 2018-06-08 MED ORDER — BISACODYL 5 MG PO TBEC: 5.0000 mg | DELAYED_RELEASE_TABLET | Freq: Every day | ORAL | Status: DC | PRN

## 2018-06-08 MED ORDER — SUCRALFATE 1 G PO TABS
1.0000 g | ORAL_TABLET | Freq: Two times a day (BID) | ORAL | Status: DC
  Administered 2018-06-08: 1 g via ORAL
  Filled 2018-06-08 (×2): qty 1

## 2018-06-08 MED ORDER — BUMETANIDE 1 MG PO TABS
1.0000 mg | ORAL_TABLET | ORAL | Status: DC
  Administered 2018-06-08: 1 mg via ORAL
  Filled 2018-06-08: qty 1

## 2018-06-08 MED ORDER — NITROGLYCERIN 0.4 MG SL SUBL: 0.4000 mg | SUBLINGUAL_TABLET | SUBLINGUAL | Status: DC | PRN

## 2018-06-08 MED ORDER — PANTOPRAZOLE SODIUM 40 MG PO TBEC
40.0000 mg | DELAYED_RELEASE_TABLET | Freq: Every day | ORAL | Status: DC
  Administered 2018-06-08: 40 mg via ORAL
  Filled 2018-06-08: qty 1

## 2018-06-08 MED ORDER — SODIUM CHLORIDE 0.9 % IV SOLN
Freq: Once | INTRAVENOUS | Status: AC
  Administered 2018-06-08: 02:00:00 via INTRAVENOUS

## 2018-06-08 MED ORDER — NORTRIPTYLINE HCL 10 MG PO CAPS
20.0000 mg | ORAL_CAPSULE | Freq: Every day | ORAL | Status: DC
  Administered 2018-06-08: 20 mg via ORAL
  Filled 2018-06-08 (×2): qty 2

## 2018-06-08 MED ORDER — MIRTAZAPINE 15 MG PO TABS
30.0000 mg | ORAL_TABLET | Freq: Every day | ORAL | Status: DC
  Administered 2018-06-08: 30 mg via ORAL
  Filled 2018-06-08: qty 2

## 2018-06-08 MED ORDER — ROSUVASTATIN CALCIUM 10 MG PO TABS: 40.0000 mg | ORAL_TABLET | Freq: Every day | ORAL | Status: DC

## 2018-06-08 MED ORDER — ONDANSETRON HCL 4 MG PO TABS: 4.0000 mg | ORAL_TABLET | Freq: Four times a day (QID) | ORAL | Status: DC | PRN

## 2018-06-08 MED ORDER — FLUTICASONE PROPIONATE 50 MCG/ACT NA SUSP
2.0000 | Freq: Every day | NASAL | Status: DC | PRN
  Filled 2018-06-08: qty 16

## 2018-06-08 MED ORDER — LOSARTAN POTASSIUM 25 MG PO TABS
25.0000 mg | ORAL_TABLET | Freq: Every day | ORAL | Status: DC
  Administered 2018-06-08: 25 mg via ORAL
  Filled 2018-06-08: qty 1

## 2018-06-08 MED ORDER — ILOPERIDONE 4 MG PO TABS
12.0000 mg | ORAL_TABLET | Freq: Two times a day (BID) | ORAL | Status: DC
  Administered 2018-06-08 (×2): 12 mg via ORAL
  Filled 2018-06-08 (×4): qty 3

## 2018-06-08 NOTE — Consult Note (Signed)
Reason for Consult: Shortness of breath possible angina elevated troponin Referring Physician:                                                                                                 Caleb Decock. is an 57 y.o. male.  HPI: Patient is a 57 year old white male history of congestive heart failure chronic insufficiency diabetes hypertension schizophrenia HIV.  Patient has history of mild obesity presented to emergency room with generalized weakness lightheadedness over the past 2 days gradually getting worse.  Patient had some nausea decreased appetite.  Patient denied any discrete chest pain but had some dyspnea denied palpitation tachycardia he recently had a hospitalization for sepsis.  Blood work done emergency room suggested borderline elevated troponins patient was found to have a low blood pressure of about 90 systolic low sats in the 58K so the patient was admitted for further assessment evaluation EKG had nonspecific ST-T wave changes  Past Medical History:  Diagnosis Date  . Anemia   . Anxiety   . Arthritis   . CHF (congestive heart failure) ([place])   . Chronic kidney disease    Renal Insufficiency Syndrome; Glomerulosclerosis 2013  . Complication of anesthesia   . Depressed   . Diabetes mellitus without complication ([place])   . High cholesterol   . HIV (human immunodeficiency virus infection) ([place])   . Hypertension   . 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 Paranoid disorder ([place])   . Schizophrenia, paranoid ([place])   . Sleep apnea     Past Surgical History:  Procedure Laterality Date  . COLONOSCOPY WITH PROPOFOL N/A 09/03/2015   Procedure: COLONOSCOPY WITH PROPOFOL;  Surgeon: Lollie Sails, MD;  Location: [facility] ENDOSCOPY;  Service: Endoscopy;  Laterality: N/A;  . DG TEETH FULL    . ESOPHAGOGASTRODUODENOSCOPY (EGD) WITH PROPOFOL N/A 07/01/2016   Procedure: ESOPHAGOGASTRODUODENOSCOPY (EGD) WITH PROPOFOL;  Surgeon: Lollie Sails, MD;  Location: [facility] ENDOSCOPY;  Service:  Endoscopy;  Laterality: N/A;    Family History  Problem Relation Age of Onset  . Heart attack Mother   . Diabetes Mellitus II Mother   . Mental illness Mother   . CAD Mother   . Heart attack Father   . CAD Father   . Hypertension Father     Social History:  reports that he has never smoked. He has never used smokeless tobacco. He reports that he does not drink alcohol or use drugs.  Allergies:  Allergies  Allergen Reactions  . Trazodone And Nefazodone   . Viread [Tenofovir Disoproxil]     Damages kidneys  . Etodolac Rash    Medications: I have reviewed the patient's current medications.  Results for orders placed or performed during the hospital encounter of 06/07/18 (from the past 48 hour(s))  CBC with Differential     Status: Abnormal   Collection Time: 06/07/18  7:53 PM  Result Value Ref Range   WBC 11.3 (H) 4.0 - 10.5 K/uL   RBC 3.96 (L) 4.22 - 5.81 MIL/uL   Hemoglobin 11.8 (L) 13.0 - 17.0 g/dL   HCT 36.0 (L) 39.0 -  52.0 %   MCV 90.9 80.0 - 100.0 fL   MCH 29.8 26.0 - 34.0 pg   MCHC 32.8 30.0 - 36.0 g/dL   RDW 15.6 (H) 11.5 - 15.5 %   Platelets 222 150 - 400 K/uL   nRBC 0.0 0.0 - 0.2 %   Neutrophils Relative % 62 %   Neutro Abs 7.1 1.7 - 7.7 K/uL   Lymphocytes Relative 25 %   Lymphs Abs 2.9 0.7 - 4.0 K/uL   Monocytes Relative 10 %   Monocytes Absolute 1.1 (H) 0.1 - 1.0 K/uL   Eosinophils Relative 1 %   Eosinophils Absolute 0.2 0.0 - 0.5 K/uL   Basophils Relative 1 %   Basophils Absolute 0.1 0.0 - 0.1 K/uL   Immature Granulocytes 1 %   Abs Immature Granulocytes 0.07 0.00 - 0.07 K/uL    Comment: Performed at [facility], [address] [place], [place] [address]  Comprehensive metabolic panel     Status: Abnormal   Collection Time: 06/07/18  7:53 PM  Result Value Ref Range   Sodium 136 135 - 145 mmol/L   Potassium 4.2 3.5 - 5.1 mmol/L    Comment: HEMOLYSIS AT THIS LEVEL MAY AFFECT RESULT   Chloride 100 98 - 111 mmol/L   CO2 23 22 - 32 mmol/L    Glucose, Bld 140 (H) 70 - 99 mg/dL   BUN 22 (H) 6 - 20 mg/dL   Creatinine, Ser 1.77 (H) 0.61 - 1.24 mg/dL   Calcium 8.7 (L) 8.9 - 10.3 mg/dL   Total Protein 7.2 6.5 - 8.1 g/dL   Albumin 3.5 3.5 - 5.0 g/dL   AST 36 15 - 41 U/L    Comment: HEMOLYSIS AT THIS LEVEL MAY AFFECT RESULT   ALT 13 0 - 44 U/L   Alkaline Phosphatase 85 38 - 126 U/L   Total Bilirubin 0.9 0.3 - 1.2 mg/dL    Comment: HEMOLYSIS AT THIS LEVEL MAY AFFECT RESULT   GFR calc non Af Amer 41 (L) >60 mL/min   GFR calc Af Amer 48 (L) >60 mL/min   Anion gap 13 5 - 15    Comment: Performed at [facility], [address] [place], [place] [address]  Troponin I - ONCE - STAT     Status: Abnormal   Collection Time: 06/07/18  7:53 PM  Result Value Ref Range   Troponin I 0.35 (HH) <0.03 ng/mL    Comment: CRITICAL RESULT CALLED TO, READ BACK BY AND VERIFIED WITH LISA THOMPSON @2031  ON 06/07/18 BY FMW Performed at [facility], [address] [place], [place] [address]   Urinalysis, Complete w Microscopic     Status: Abnormal   Collection Time: 06/07/18 10:05 PM  Result Value Ref Range   Color, Urine YELLOW (A) YELLOW   APPearance CLEAR (A) CLEAR   Specific Gravity, Urine 1.018 1.005 - 1.030   pH 5.0 5.0 - 8.0   Glucose, UA NEGATIVE NEGATIVE mg/dL   Hgb urine dipstick NEGATIVE NEGATIVE   Bilirubin Urine NEGATIVE NEGATIVE   Ketones, ur NEGATIVE NEGATIVE mg/dL   Protein, ur 100 (A) NEGATIVE mg/dL   Nitrite NEGATIVE NEGATIVE   Leukocytes, UA NEGATIVE NEGATIVE   RBC / HPF 0-5 0 - 5 RBC/hpf   WBC, UA 0-5 0 - 5 WBC/hpf   Bacteria, UA NONE SEEN NONE SEEN   Squamous Epithelial / LPF 0-5 0 - 5   Mucus PRESENT    Hyaline Casts, UA PRESENT  Comment: Performed at [facility], [address] [place], [place] [address]  Protime-INR     Status: None   Collection Time: 06/07/18 10:05 PM  Result Value Ref Range   Prothrombin Time 12.2 11.4 - 15.2 seconds   INR 0.91     Comment: Performed at  [facility], [address] [place], [place] [address]  APTT     Status: Abnormal   Collection Time: 06/07/18 11:45 PM  Result Value Ref Range   aPTT 91 (H) 24 - 36 seconds    Comment:        IF BASELINE aPTT IS ELEVATED, SUGGEST PATIENT RISK ASSESSMENT BE USED TO DETERMINE APPROPRIATE ANTICOAGULANT THERAPY. Performed at [facility], [address] [place], [place] [address]   Troponin I - ONCE - STAT     Status: Abnormal   Collection Time: 06/07/18 11:45 PM  Result Value Ref Range   Troponin I 0.26 (HH) <0.03 ng/mL    Comment: CRITICAL VALUE NOTED. VALUE IS CONSISTENT WITH PREVIOUSLY REPORTED/CALLED VALUE /FLC Performed at [facility], [address] [place], [place] [address]   Glucose, capillary     Status: None   Collection Time: 06/08/18  1:00 AM  Result Value Ref Range   Glucose-Capillary 88 70 - 99 mg/dL   Comment 1 Notify RN    Comment 2 Document in Chart   MRSA PCR Screening     Status: None   Collection Time: 06/08/18  1:10 AM  Result Value Ref Range   MRSA by PCR NEGATIVE NEGATIVE    Comment:        The GeneXpert MRSA Assay (FDA approved for NASAL specimens only), is one component of a comprehensive MRSA colonization surveillance program. It is not intended to diagnose MRSA infection nor to guide or monitor treatment for MRSA infections. Performed at [facility], [place], [place] [address]   Heparin level (unfractionated)     Status: Abnormal   Collection Time: 06/08/18  5:34 AM  Result Value Ref Range   Heparin Unfractionated 0.11 (L) 0.30 - 0.70 IU/mL    Comment: (NOTE) If heparin results are below expected values, and patient dosage has  been confirmed, suggest follow up testing of antithrombin III levels. Performed at [facility], [address] [place], [place] [address]   Basic metabolic panel     Status: Abnormal   Collection Time: 06/08/18  5:34 AM  Result Value Ref Range    Sodium 140 135 - 145 mmol/L   Potassium 3.5 3.5 - 5.1 mmol/L   Chloride 103 98 - 111 mmol/L   CO2 28 22 - 32 mmol/L   Glucose, Bld 81 70 - 99 mg/dL   BUN 19 6 - 20 mg/dL   Creatinine, Ser 1.43 (H) 0.61 - 1.24 mg/dL   Calcium 8.1 (L) 8.9 - 10.3 mg/dL   GFR calc non Af Amer 53 (L) >60 mL/min   GFR calc Af Amer >60 >60 mL/min   Anion gap 9 5 - 15    Comment: Performed at [facility], [address] [place], [place] [address]  CBC     Status: Abnormal   Collection Time: 06/08/18  5:34 AM  Result Value Ref Range   WBC 8.3 4.0 - 10.5 K/uL   RBC 3.53 (L) 4.22 - 5.81 MIL/uL   Hemoglobin 10.4 (L) 13.0 - 17.0 g/dL   HCT 32.8 (L) 39.0 - 52.0 %   MCV 92.9 80.0 -  100.0 fL   MCH 29.5 26.0 - 34.0 pg   MCHC 31.7 30.0 - 36.0 g/dL   RDW 15.6 (H) 11.5 - 15.5 %   Platelets 210 150 - 400 K/uL   nRBC 0.0 0.0 - 0.2 %    Comment: Performed at [facility], [address] [place], [place] [address]  Glucose, capillary     Status: Abnormal   Collection Time: 06/08/18  8:39 AM  Result Value Ref Range   Glucose-Capillary 68 (L) 70 - 99 mg/dL   Comment 1 Notify RN   Glucose, capillary     Status: None   Collection Time: 06/08/18  8:58 AM  Result Value Ref Range   Glucose-Capillary 72 70 - 99 mg/dL   Comment 1 Notify RN   Glucose, capillary     Status: Abnormal   Collection Time: 06/08/18 11:43 AM  Result Value Ref Range   Glucose-Capillary 64 (L) 70 - 99 mg/dL   Comment 1 Notify RN   Glucose, capillary     Status: None   Collection Time: 06/08/18 12:09 PM  Result Value Ref Range   Glucose-Capillary 81 70 - 99 mg/dL   Comment 1 Notify RN     No results found.  Review of Systems  Constitutional: Positive for diaphoresis and malaise/fatigue.  HENT: Positive for congestion.   Eyes: Negative.   Respiratory: Positive for shortness of breath.   Cardiovascular: Positive for chest pain, orthopnea and PND.  Gastrointestinal: Negative.   Genitourinary: Negative.    Musculoskeletal: Positive for myalgias.  Skin: Negative.   Neurological: Positive for focal weakness and weakness.  Endo/Heme/Allergies: Negative.   Psychiatric/Behavioral: Negative.    Blood pressure 117/78, pulse 75, temperature 97.6 F (36.4 C), temperature source Oral, resp. rate 16, height 5\' 7"  (1.702 m), weight 97.5 kg, SpO2 92 %. Physical Exam  Nursing note and vitals reviewed. Constitutional: He is oriented to person, place, and time. He appears well-developed and well-nourished.  HENT:  Head: Normocephalic and atraumatic.  Eyes: Pupils are equal, round, and reactive to light. Conjunctivae and EOM are normal.  Neck: Normal range of motion. Neck supple.  Cardiovascular: Normal rate, regular rhythm and normal heart sounds.  Respiratory: Effort normal and breath sounds normal.  GI: Soft. Bowel sounds are normal.  Musculoskeletal: Normal range of motion.  Neurological: He is alert and oriented to person, place, and time. He has normal reflexes.  Skin: Skin is warm and dry.  Psychiatric: He has a normal mood and affect.    Assessment/Plan: Shortness of breath Vertigo Headaches Obesity Congestive heart failure chronic Renal sufficiency Anxiety Diabetes Hyperlipidemia HIV Schizophrenia Obstructive sleep apnea Obesity. . Plan Agree with admit rule out for myocardial infarction Follow-up EKGs troponins Recommend conservative cardiac therapy Recommend treat the patient medically Elevated troponin probably demand ischemia Renal insufficiency we will have the patient follow-up with nephrology Recommend weight loss exercise portion control Discontinue IV anticoagulation after 24 hours Recommend have the patient follow-up with cardiology as an outpatient   D  06/08/2018, 1:20 PM

## 2018-06-08 NOTE — Progress Notes (Signed)
*  PRELIMINARY RESULTS* Echocardiogram 2D Echocardiogram has been performed.  Sherrie Sport 06/08/2018, 1:59 PM

## 2018-06-08 NOTE — H&P (Signed)
[facility] NAME: Danny Cook    MR#:  [id number]  DATE OF BIRTH:  1961-04-21  DATE OF ADMISSION:  06/07/2018  PRIMARY CARE PHYSICIAN: Katheren Shams   REQUESTING/REFERRING PHYSICIAN:   CHIEF COMPLAINT:   Chief Complaint  Patient presents with  . Headache  . Dizziness    HISTORY OF PRESENT ILLNESS: Danny Cook  is a 57 y.o. male with a known history of CHF, CKD, diabetes type 2, hypertension, schizophrenia, HIV and other comorbidities. Patient presented to emergency room for generalized weakness and lightheadedness going on for the past 2 days, gradually getting worse.  Patient also has some nausea and decreased appetite associated with his symptoms.  He denies any chest pain, shortness of breath or palpitations, no vomiting, no bleeding.  He denies any new medications.  He was recently hospitalized for sepsis. Blood test done emergency room are notable for elevated troponin level of 0.35.  Initial blood pressure was on the low side at 90/45 and heart rate 103.  Oxygen saturation 90% on room air. EKG shows sinus tachycardia with heart rate at 103 bpm.  There is borderline lower than 2 mm ST elevation in inferior leads with borderline ST depression in V2 and V3.  This changes are noted on prior EKG from 05/04/2018. Patient is admitted for further evaluation and treatment.  PAST MEDICAL HISTORY:   Past Medical History:  Diagnosis Date  . Anemia   . Anxiety   . Arthritis   . CHF (congestive heart failure) ([place])   . Chronic kidney disease    Renal Insufficiency Syndrome; Glomerulosclerosis 2013  . Complication of anesthesia   . Depressed   . Diabetes mellitus without complication ([place])   . High cholesterol   . HIV (human immunodeficiency virus infection) ([place])   . Hypertension   . 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 Paranoid disorder ([place])   . Schizophrenia, paranoid ([place])   . Sleep apnea     PAST SURGICAL  HISTORY:  Past Surgical History:  Procedure Laterality Date  . COLONOSCOPY WITH PROPOFOL N/A 09/03/2015   Procedure: COLONOSCOPY WITH PROPOFOL;  Surgeon: Lollie Sails, MD;  Location: [facility] ENDOSCOPY;  Service: Endoscopy;  Laterality: N/A;  . DG TEETH FULL    . ESOPHAGOGASTRODUODENOSCOPY (EGD) WITH PROPOFOL N/A 07/01/2016   Procedure: ESOPHAGOGASTRODUODENOSCOPY (EGD) WITH PROPOFOL;  Surgeon: Lollie Sails, MD;  Location: [facility] ENDOSCOPY;  Service: Endoscopy;  Laterality: N/A;    SOCIAL HISTORY:  Social History   Tobacco Use  . Smoking status: Never Smoker  . Smokeless tobacco: Never Used  Substance Use Topics  . Alcohol use: No    FAMILY HISTORY:  Family History  Problem Relation Age of Onset  . Heart attack Mother   . Diabetes Mellitus II Mother   . Mental illness Mother   . CAD Mother   . Heart attack Father   . CAD Father   . Hypertension Father     DRUG ALLERGIES:  Allergies  Allergen Reactions  . Trazodone And Nefazodone   . Viread [Tenofovir Disoproxil]     Damages kidneys  . Etodolac Rash    REVIEW OF SYSTEMS:   CONSTITUTIONAL: No fever, but positive for fatigue and generalized weakness.  EYES: No changes in vision.  EARS, NOSE, AND THROAT: No tinnitus or ear pain.  RESPIRATORY: No cough, shortness of breath, wheezing or hemoptysis.  CARDIOVASCULAR: Positive for lightheadedness.  No chest pain, orthopnea, edema.  GASTROINTESTINAL: Positive for  nausea, no vomiting, diarrhea or abdominal pain.  GENITOURINARY: No dysuria, hematuria.  ENDOCRINE: No polyuria, nocturia. HEMATOLOGY: No bleeding. SKIN: No rash or lesion. MUSCULOSKELETAL: No joint pain at this time.   NEUROLOGIC: No focal weakness.  PSYCHIATRY: No anxiety or depression.   MEDICATIONS AT HOME:  Prior to Admission medications   Medication Sig Start Date End Date Taking? Authorizing Provider  Abacavir-Dolutegravir-Lamivud (TRIUMEQ) 600-50-300 MG TABS Take 1 tablet by mouth daily.   Yes  [provider]  acetaminophen (TYLENOL) 325 MG tablet Take 2 tablets (650 mg total) by mouth every 6 (six) hours as needed for fever (Fever >101). 05/03/18  Yes Vaughan Basta, MD  albuterol (PROVENTIL HFA;VENTOLIN HFA) 108 (90 Base) MCG/ACT inhaler Inhale 2 puffs into the lungs every 6 (six) hours as needed for wheezing or shortness of breath. 04/14/18  Yes Loletha Grayer, MD  aspirin EC 325 MG tablet Take 325 mg by mouth daily.   Yes [provider]  benzonatate (TESSALON PERLES) 100 MG capsule Take 1 capsule (100 mg total) by mouth every 6 (six) hours as needed for cough. 04/24/18 04/24/19 Yes McShane, Gerda Diss, MD  bumetanide (BUMEX) 1 MG tablet Take 1 mg by mouth every other day.   Yes [provider]  butalbital-acetaminophen-caffeine (FIORICET, ESGIC) 50-325-40 MG tablet Take 1-2 tablets by mouth every 6 (six) hours as needed. Patient taking differently: Take 1-2 tablets by mouth every 6 (six) hours as needed for migraine.  01/31/18 01/31/19 Yes Earleen Newport, MD  cyanocobalamin 1000 MCG tablet Take 1,000 mcg by mouth daily.   Yes [provider]  diazepam (VALIUM) 2 MG tablet Take 1 tablet (2 mg total) by mouth every 8 (eight) hours as needed for anxiety (use for breakthrough anxiety). 05/03/18  Yes Vaughan Basta, MD  diphenhydrAMINE (BENADRYL) 50 MG capsule Take 1 capsule (50 mg total) by mouth at bedtime as needed for sleep. 11/09/17  Yes McNew, Tyson Babinski, MD  divalproex (DEPAKOTE ER) 250 MG 24 hr tablet Take 500 mg by mouth 2 (two) times daily.    Yes [provider]  FANAPT 12 MG TABS Take 12 mg by mouth 2 (two) times daily.  11/14/16  Yes [provider]  ferrous sulfate 325 (65 FE) MG tablet Take 325 mg by mouth daily with breakfast.   Yes [provider]  fluticasone (FLONASE) 50 MCG/ACT nasal spray Place 2 sprays into both nostrils daily as needed for allergies.   Yes [provider]  fluticasone  (FLOVENT HFA) 220 MCG/ACT inhaler Inhale 2 puffs into the lungs 2 (two) times daily. Rinse out mouth afterwards 04/14/18  Yes Wieting, Richard, MD  gabapentin (NEURONTIN) 100 MG capsule Take 1 capsule (100 mg total) by mouth 3 (three) times daily. Patient taking differently: Take 300 mg by mouth 3 (three) times daily.  05/03/18  Yes Vaughan Basta, MD  glimepiride (AMARYL) 4 MG tablet Take 1 tablet by mouth daily with breakfast. 05/14/18 05/14/19 Yes [provider]  guaiFENesin-codeine (ROBITUSSIN AC) 100-10 MG/5ML syrup Take 5 mLs by mouth every 6 (six) hours as needed for cough.   Yes [provider]  Insulin Degludec (TRESIBA FLEXTOUCH) 200 UNIT/ML SOPN Inject 86 Units into the skin See admin instructions. Daily if blood sugar >85; If bs <85, patient uses victoza   Yes [provider]  liraglutide (VICTOZA) 18 MG/3ML SOPN Inject 1.8 mg into the skin See admin instructions. Daily if blood sugar is greater than 85; patient uses tresiba if blood sugar  is less than 85   Yes [provider]  losartan (COZAAR) 25 MG tablet Take 1 tablet (25 mg total) by mouth daily. 05/04/18  Yes Vaughan Basta, MD  metFORMIN (GLUCOPHAGE) 1000 MG tablet Take 1 tablet by mouth 2 (two) times daily. 05/14/18 05/14/19 Yes [provider]  metoprolol tartrate (LOPRESSOR) 25 MG tablet Take 0.5 tablets (12.5 mg total) by mouth 2 (two) times daily. 05/03/18  Yes Vaughan Basta, MD  mirtazapine (REMERON) 30 MG tablet Take 30 mg by mouth at bedtime. 04/21/18  Yes [provider]  nitroGLYCERIN (NITROSTAT) 0.4 MG SL tablet Place 0.4 mg under the tongue every 5 (five) minutes as needed for chest pain.    Yes [provider]  nortriptyline (PAMELOR) 10 MG capsule Take 2 capsules by mouth at bedtime. 05/24/18  Yes [provider]  omeprazole (PRILOSEC) 40 MG capsule Take 40 mg by mouth 2 (two) times daily.   Yes [provider]   promethazine (PHENERGAN) 6.25 MG/5ML syrup Take 6.25 mg by mouth every 6 (six) hours as needed for nausea or vomiting.   Yes [provider]  rosuvastatin (CRESTOR) 20 MG tablet Take 40 mg by mouth daily.    Yes [provider]  sucralfate (CARAFATE) 1 g tablet Take 1 g by mouth 2 (two) times daily. 04/21/18  Yes [provider]  triamcinolone cream (KENALOG) 0.1 % Apply 1 application topically 2 (two) times daily as needed. Patient taking differently: Apply 1 application topically 2 (two) times daily as needed (for rash).  11/16/16  Yes Little, Traci M, PA-C  venlafaxine XR (EFFEXOR-XR) 75 MG 24 hr capsule Take 1 capsule (75 mg total) by mouth daily with breakfast. 11/09/17  Yes McNew, Tyson Babinski, MD  chlorpheniramine-HYDROcodone (TUSSIONEX) 10-8 MG/5ML SUER Take 5 mLs by mouth every 12 (twelve) hours as needed for cough. Patient not taking: Reported on 05/12/2018 04/14/18   Loletha Grayer, MD  feeding supplement, GLUCERNA SHAKE, (GLUCERNA SHAKE) LIQD Take 237 mLs by mouth daily.    [provider]  Nutritional Supplements (FEEDING SUPPLEMENT, NEPRO CARB STEADY,) LIQD Take 237 mLs by mouth 2 (two) times daily between meals. Patient not taking: Reported on 05/12/2018 03/27/18   Loletha Grayer, MD      PHYSICAL EXAMINATION:   VITAL SIGNS: Blood pressure 103/78, pulse 78, temperature 98.2 F (36.8 C), temperature source Oral, resp. rate 18, height 5\' 7"  (1.702 m), weight 97.5 kg, SpO2 95 %.  GENERAL:  57 y.o.-year-old patient lying in the bed with no acute distress, at rest.  EYES: Pupils equal, round, reactive to light and accommodation. No scleral icterus. Extraocular muscles intact.  HEENT: Head atraumatic, normocephalic. Oropharynx and nasopharynx clear.  NECK:  Supple, no jugular venous distention. No thyroid enlargement, no tenderness.  LUNGS: Normal breath sounds bilaterally, no wheezing, rales,rhonchi or crepitation. No use of accessory muscles of  respiration.  CARDIOVASCULAR: S1, S2 normal. No S3/S4.  ABDOMEN: Soft, nontender, nondistended. Bowel sounds present. No organomegaly or mass.  EXTREMITIES: No pedal edema, cyanosis, or clubbing.  NEUROLOGIC: Cranial nerves II through XII are intact. Muscle strength 5/5 in all extremities. Sensation intact.   PSYCHIATRIC: The patient is alert and oriented x 3.  SKIN: No obvious rash, lesion, or ulcer.   LABORATORY PANEL:   CBC Recent Labs  Lab 06/07/18 1953  WBC 11.3*  HGB 11.8*  HCT 36.0*  PLT 222  MCV 90.9  MCH 29.8  MCHC 32.8  RDW 15.6*  LYMPHSABS 2.9  MONOABS 1.1*  EOSABS 0.2  BASOSABS 0.1   ------------------------------------------------------------------------------------------------------------------  Chemistries  Recent Labs  Lab 06/07/18 1953  NA 136  K 4.2  CL 100  CO2 23  GLUCOSE 140*  BUN 22*  CREATININE 1.77*  CALCIUM 8.7*  AST 36  ALT 13  ALKPHOS 85  BILITOT 0.9   ------------------------------------------------------------------------------------------------------------------ estimated creatinine clearance is 50 mL/min (A) (by C-G formula based on SCr of 1.77 mg/dL (H)). ------------------------------------------------------------------------------------------------------------------ No results for input(s): TSH, T4TOTAL, T3FREE, THYROIDAB in the last 72 hours.  Invalid input(s): FREET3   Coagulation profile Recent Labs  Lab 06/07/18 2205  INR 0.91   ------------------------------------------------------------------------------------------------------------------- No results for input(s): DDIMER in the last 72 hours. -------------------------------------------------------------------------------------------------------------------  Cardiac Enzymes Recent Labs  Lab 06/07/18 1953 06/07/18 2345  TROPONINI 0.35* 0.26*    ------------------------------------------------------------------------------------------------------------------ Invalid input(s): POCBNP  ---------------------------------------------------------------------------------------------------------------  Urinalysis    Component Value Date/Time   COLORURINE YELLOW (A) 06/07/2018 2205   APPEARANCEUR CLEAR (A) 06/07/2018 2205   APPEARANCEUR Clear 10/23/2014 1719   LABSPEC 1.018 06/07/2018 2205   LABSPEC 1.017 10/23/2014 1719   PHURINE 5.0 06/07/2018 2205   GLUCOSEU NEGATIVE 06/07/2018 2205   GLUCOSEU Negative 10/23/2014 1719   HGBUR NEGATIVE 06/07/2018 2205   BILIRUBINUR NEGATIVE 06/07/2018 2205   BILIRUBINUR Negative 10/23/2014 1719   KETONESUR NEGATIVE 06/07/2018 2205   PROTEINUR 100 (A) 06/07/2018 2205   NITRITE NEGATIVE 06/07/2018 2205   LEUKOCYTESUR NEGATIVE 06/07/2018 2205   LEUKOCYTESUR Negative 10/23/2014 1719     RADIOLOGY: No results found.  EKG: Orders placed or performed during the hospital encounter of 06/07/18  . ED EKG  . ED EKG  . EKG 12-Lead  . EKG 12-Lead    IMPRESSION AND PLAN:  1.  NSTEMI.  We will start patient on heparin drip.  Continue to monitor on telemetry and follow troponin levels.  We will check 2D echo to further evaluate the cardiac function.  Cardiology is consulted for further evaluation and treatment. 2.  CKD 3, creatinine is at baseline.  We will start gentle IV hydration and monitor kidney function closely.  Avoid nephrotoxic medications. 3.  Diabetes type 2.  Will monitor blood sugars before meals and at bedtime and use insulin treatment during the hospital stay. 4.  HIV, resume home medications.   All the records are reviewed and case discussed with ED provider. Management plans discussed with the patient, family and they are in agreement.  CODE STATUS: Full    Code Status Orders  (From admission, onward)         Start     Ordered   06/08/18 0108  Full code  Continuous      06/08/18 0107        Code Status History    Date Active Date Inactive Code Status Order ID Comments User Context   04/25/2018 0920 05/03/2018 2249 Full Code [id number]  Arta Silence, MD ED   04/12/2018 1407 04/14/2018 1852 Full Code [id number]  Vaughan Basta, MD Inpatient   03/23/2018 2123 03/27/2018 1802 Full Code [id number]  Lance Coon, MD ED   11/04/2017 2050 11/09/2017 1400 Full Code [id number]  Gonzella Lex, MD Inpatient   07/31/2017 0240 08/01/2017 1952 Full Code [id number]  [name], MD ED       TOTAL TIME TAKING CARE OF THIS PATIENT: 50 minutes.    [name] M.D on 06/08/2018 at 5:19 AM  Between 7am to 6pm - Pager - [phone]  After 6pm go to [email or web address] - [facility]  [place] [facility]  [phone]  CC: Primary care physician; Katheren Shams

## 2018-06-08 NOTE — ED Notes (Signed)
ED TO INPATIENT HANDOFF REPORT  Name/Age/Gender Danny Cook. 57 y.o. male  Code Status Code Status History    Date Active Date Inactive Code Status Order ID Comments User Context   04/25/2018 0920 05/03/2018 2249 Full Code [id number]  Arta Silence, MD ED   04/12/2018 1407 04/14/2018 1852 Full Code [id number]  Vaughan Basta, MD Inpatient   03/23/2018 2123 03/27/2018 1802 Full Code [id number]  Lance Coon, MD ED   11/04/2017 2050 11/09/2017 1400 Full Code [id number]  Gonzella Lex, MD Inpatient   07/31/2017 0240 08/01/2017 1952 Full Code [id number]  [name], MD ED      Home/SNF/Other Nursing Home  Chief Complaint Flu like Symptoms  Level of Care/Admitting Diagnosis ED Disposition    ED Disposition Condition [place]: [place] [100120]  Level of Care: Telemetry [5]  Diagnosis: NSTEMI (non-ST elevated myocardial infarction) [facility]) [660630]  Admitting Physician: [name] [[id number]]  Attending Physician: [name] [phone]  Estimated length of stay: past midnight tomorrow  Certification:: I certify this patient will need inpatient services for at least 2 midnights  PT Class (Do Not Modify): Inpatient [101]  PT Acc Code (Do Not Modify): Private [1]       Medical History Past Medical History:  Diagnosis Date  . Anemia   . Anxiety   . Arthritis   . CHF (congestive heart failure) ([place])   . Chronic kidney disease    Renal Insufficiency Syndrome; Glomerulosclerosis 2013  . Complication of anesthesia   . Depressed   . Diabetes mellitus without complication ([place])   . High cholesterol   . HIV (human immunodeficiency virus infection) ([place])   . Hypertension   . 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 Paranoid disorder ([place])   . Schizophrenia, paranoid ([place])   . Sleep apnea     Allergies Allergies  Allergen Reactions  . Trazodone And Nefazodone   . Viread [Tenofovir Disoproxil]     Damages kidneys  .  Etodolac Rash    IV Location/Drains/Wounds Patient Lines/Drains/Airways Status   Active Line/Drains/Airways    Name:   Placement date:   Placement time:   Site:   Days:   Peripheral IV 06/07/18 Right Hand   06/07/18    1957    Hand   1   Peripheral IV 06/07/18 Right Hand   06/07/18    2226    Hand   1          Labs/Imaging Results for orders placed or performed during the hospital encounter of 06/07/18 (from the past 48 hour(s))  CBC with Differential     Status: Abnormal   Collection Time: 06/07/18  7:53 PM  Result Value Ref Range   WBC 11.3 (H) 4.0 - 10.5 K/uL   RBC 3.96 (L) 4.22 - 5.81 MIL/uL   Hemoglobin 11.8 (L) 13.0 - 17.0 g/dL   HCT 36.0 (L) 39.0 - 52.0 %   MCV 90.9 80.0 - 100.0 fL   MCH 29.8 26.0 - 34.0 pg   MCHC 32.8 30.0 - 36.0 g/dL   RDW 15.6 (H) 11.5 - 15.5 %   Platelets 222 150 - 400 K/uL   nRBC 0.0 0.0 - 0.2 %   Neutrophils Relative % 62 %   Neutro Abs 7.1 1.7 - 7.7 K/uL   Lymphocytes Relative 25 %   Lymphs Abs 2.9 0.7 - 4.0 K/uL   Monocytes Relative 10 %   Monocytes Absolute 1.1 (H) 0.1 - 1.0  K/uL   Eosinophils Relative 1 %   Eosinophils Absolute 0.2 0.0 - 0.5 K/uL   Basophils Relative 1 %   Basophils Absolute 0.1 0.0 - 0.1 K/uL   Immature Granulocytes 1 %   Abs Immature Granulocytes 0.07 0.00 - 0.07 K/uL    Comment: Performed at [facility], [address] [place], [place] [address]  Comprehensive metabolic panel     Status: Abnormal   Collection Time: 06/07/18  7:53 PM  Result Value Ref Range   Sodium 136 135 - 145 mmol/L   Potassium 4.2 3.5 - 5.1 mmol/L    Comment: HEMOLYSIS AT THIS LEVEL MAY AFFECT RESULT   Chloride 100 98 - 111 mmol/L   CO2 23 22 - 32 mmol/L   Glucose, Bld 140 (H) 70 - 99 mg/dL   BUN 22 (H) 6 - 20 mg/dL   Creatinine, Ser 1.77 (H) 0.61 - 1.24 mg/dL   Calcium 8.7 (L) 8.9 - 10.3 mg/dL   Total Protein 7.2 6.5 - 8.1 g/dL   Albumin 3.5 3.5 - 5.0 g/dL   AST 36 15 - 41 U/L    Comment: HEMOLYSIS AT THIS LEVEL MAY AFFECT  RESULT   ALT 13 0 - 44 U/L   Alkaline Phosphatase 85 38 - 126 U/L   Total Bilirubin 0.9 0.3 - 1.2 mg/dL    Comment: HEMOLYSIS AT THIS LEVEL MAY AFFECT RESULT   GFR calc non Af Amer 41 (L) >60 mL/min   GFR calc Af Amer 48 (L) >60 mL/min   Anion gap 13 5 - 15    Comment: Performed at [facility], [address] [place], [place] [address]  Troponin I - ONCE - STAT     Status: Abnormal   Collection Time: 06/07/18  7:53 PM  Result Value Ref Range   Troponin I 0.35 (HH) <0.03 ng/mL    Comment: CRITICAL RESULT CALLED TO, READ BACK BY AND VERIFIED WITH LISA THOMPSON @2031  ON 06/07/18 BY FMW Performed at [facility], [address] [place], [place] [address]   Urinalysis, Complete w Microscopic     Status: Abnormal   Collection Time: 06/07/18 10:05 PM  Result Value Ref Range   Color, Urine YELLOW (A) YELLOW   APPearance CLEAR (A) CLEAR   Specific Gravity, Urine 1.018 1.005 - 1.030   pH 5.0 5.0 - 8.0   Glucose, UA NEGATIVE NEGATIVE mg/dL   Hgb urine dipstick NEGATIVE NEGATIVE   Bilirubin Urine NEGATIVE NEGATIVE   Ketones, ur NEGATIVE NEGATIVE mg/dL   Protein, ur 100 (A) NEGATIVE mg/dL   Nitrite NEGATIVE NEGATIVE   Leukocytes, UA NEGATIVE NEGATIVE   RBC / HPF 0-5 0 - 5 RBC/hpf   WBC, UA 0-5 0 - 5 WBC/hpf   Bacteria, UA NONE SEEN NONE SEEN   Squamous Epithelial / LPF 0-5 0 - 5   Mucus PRESENT    Hyaline Casts, UA PRESENT     Comment: Performed at [facility], [address] [place], [place] [address]  Protime-INR     Status: None   Collection Time: 06/07/18 10:05 PM  Result Value Ref Range   Prothrombin Time 12.2 11.4 - 15.2 seconds   INR 0.91     Comment: Performed at [facility], [address] [place], [place] [address]  APTT     Status: Abnormal   Collection Time: 06/07/18 11:45 PM  Result Value Ref Range   aPTT 91 (H) 24 - 36 seconds    Comment:  IF BASELINE aPTT IS ELEVATED, SUGGEST PATIENT RISK ASSESSMENT BE USED TO  DETERMINE APPROPRIATE ANTICOAGULANT THERAPY. Performed at [facility], [address] [place], [place] [address]    No results found.  Pending Labs Unresulted Labs (From admission, onward)    Start     Ordered   06/08/18 0500  Heparin level (unfractionated)  Tomorrow morning,   STAT     06/07/18 2156   06/07/18 2344  Troponin I - ONCE - STAT  ONCE - STAT,   STAT     06/07/18 2343          Vitals/Pain Today's Vitals   06/07/18 2130 06/07/18 2200 06/07/18 2230 06/07/18 2341  BP: 104/68 102/73 108/62 115/71  Pulse: 86 86 87 84  Resp: (!) 21 (!) 22 20 (!) 22  Temp:      TempSrc:      SpO2: 97% 98% 98% 96%  Weight:      Height:      PainSc:        Isolation Precautions No active isolations  Medications Medications  heparin ADULT infusion 100 units/mL (25000 units/273mL sodium chloride 0.45%) (1,200 Units/hr Intravenous New Bag/Given 06/07/18 2223)  ondansetron (ZOFRAN) injection 4 mg (4 mg Intravenous Given 06/07/18 2200)  sodium chloride 0.9 % bolus 500 mL (0 mLs Intravenous Stopped 06/07/18 2230)  heparin bolus via infusion 4,000 Units (4,000 Units Intravenous Bolus from Bag 06/07/18 2221)    Mobility walks

## 2018-06-08 NOTE — Discharge Summary (Signed)
[facility] NAME: Danny Cook    MR#:  [id number]  DATE OF BIRTH:  [birth year]  DATE OF ADMISSION:  06/07/2018 ADMITTING PHYSICIAN: [name], MD  DATE OF DISCHARGE: 06/08/2018  PRIMARY CARE PHYSICIAN: [facility], Jefm Bryant    ADMISSION DIAGNOSIS:  Non-ST elevated myocardial infarction ([place]) [I21.4]  DISCHARGE DIAGNOSIS:  generalized weakness and lightheadedness improved elevated troponin without chest pain or shortness of breath chronic kidney disease stage III SECONDARY DIAGNOSIS:   Past Medical History:  Diagnosis Date  . Anemia   . Anxiety   . Arthritis   . CHF (congestive heart failure) ([place])   . Chronic kidney disease    Renal Insufficiency Syndrome; Glomerulosclerosis 2013  . Complication of anesthesia   . Depressed   . Diabetes mellitus without complication ([place])   . High cholesterol   . HIV (human immunodeficiency virus infection) ([place])   . Hypertension   . 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 Paranoid disorder ([place])   . Schizophrenia, paranoid ([place])   . Sleep apnea     HOSPITAL COURSE:    Danny Cook  is a 57 y.o. male with a known history of CHF, CKD, diabetes type 2, hypertension, schizophrenia, HIV and other comorbidities. Patient presented to emergency room for generalized weakness and lightheadedness going on for the past 2 days, gradually getting worse.  Patient also has some nausea and decreased appetite associated with his symptoms.  He denies any chest pain, shortness of breath or palpitations, no vomiting, no bleeding.  He denies any new medications.  He was recently hospitalized for sepsis.  1. elevated troponin without any symptoms of chest pain or you EKG changes.  -Patient was on heparin drip.   -opponent .16 --- .26  -patient asymptomatic  -no known history of coronary artery disease at least in the records. Patient denies any chest pain  - Cardiology consultation with Dr. Clayborn Bigness  appreciated. No indication for further cardiac workup. -Patient will follow-up with Dr. Clayborn Bigness as outpatient -troponin's could be false positive secondary to elevated creatinine  2.  CKD 3, creatinine is at baseline.   -received IV hydration and monitor kidney function closely.  Avoid nephrotoxic medications.  3.  Diabetes type 2.  Will monitor blood sugars before meals and at bedtime and use insulin treatment during the hospital stay.  4.  HIV, resume home medications.  5. Generalized weakness. Patient feels a lot better. He ambulated around the nurses station without any difficulty.  Will discharge back to group home. CONSULTS OBTAINED:  Treatment Team:  Yolonda Kida, MD  DRUG ALLERGIES:   Allergies  Allergen Reactions  . Trazodone And Nefazodone   . Viread [Tenofovir Disoproxil]     Damages kidneys  . Etodolac Rash    DISCHARGE MEDICATIONS:   Allergies as of 06/08/2018      Reactions   Trazodone And Nefazodone    Viread [tenofovir Disoproxil]    Damages kidneys   Etodolac Rash      Medication List    STOP taking these medications   chlorpheniramine-HYDROcodone 10-8 MG/5ML Suer Commonly known as:  TUSSIONEX     TAKE these medications   acetaminophen 325 MG tablet Commonly known as:  TYLENOL Take 2 tablets (650 mg total) by mouth every 6 (six) hours as needed for fever (Fever >101).   albuterol 108 (90 Base) MCG/ACT inhaler Commonly known as:  PROVENTIL HFA;VENTOLIN HFA Inhale 2 puffs into the lungs every 6 (six) hours  as needed for wheezing or shortness of breath.   aspirin EC 325 MG tablet Take 325 mg by mouth daily.   benzonatate 100 MG capsule Commonly known as:  TESSALON Take 1 capsule (100 mg total) by mouth every 6 (six) hours as needed for cough.   bumetanide 1 MG tablet Commonly known as:  BUMEX Take 1 mg by mouth every other day.   butalbital-acetaminophen-caffeine 50-325-40 MG tablet Commonly known as:  FIORICET, ESGIC Take 1-2  tablets by mouth every 6 (six) hours as needed. What changed:  reasons to take this   cyanocobalamin 1000 MCG tablet Take 1,000 mcg by mouth daily.   diazepam 2 MG tablet Commonly known as:  VALIUM Take 1 tablet (2 mg total) by mouth every 8 (eight) hours as needed for anxiety (use for breakthrough anxiety).   diphenhydrAMINE 50 MG capsule Commonly known as:  BENADRYL Take 1 capsule (50 mg total) by mouth at bedtime as needed for sleep.   divalproex 250 MG 24 hr tablet Commonly known as:  DEPAKOTE ER Take 500 mg by mouth 2 (two) times daily.   FANAPT 12 MG Tabs Generic drug:  Iloperidone Take 12 mg by mouth 2 (two) times daily.   feeding supplement (GLUCERNA SHAKE) Liqd Take 237 mLs by mouth daily. What changed:  Another medication with the same name was removed. Continue taking this medication, and follow the directions you see here.   ferrous sulfate 325 (65 FE) MG tablet Take 325 mg by mouth daily with breakfast.   fluticasone 220 MCG/ACT inhaler Commonly known as:  FLOVENT HFA Inhale 2 puffs into the lungs 2 (two) times daily. Rinse out mouth afterwards   fluticasone 50 MCG/ACT nasal spray Commonly known as:  FLONASE Place 2 sprays into both nostrils daily as needed for allergies.   gabapentin 100 MG capsule Commonly known as:  NEURONTIN Take 1 capsule (100 mg total) by mouth 3 (three) times daily. What changed:  how much to take   glimepiride 4 MG tablet Commonly known as:  AMARYL Take 1 tablet by mouth daily with breakfast.   guaiFENesin-codeine 100-10 MG/5ML syrup Commonly known as:  ROBITUSSIN AC Take 5 mLs by mouth every 6 (six) hours as needed for cough.   liraglutide 18 MG/3ML Sopn Commonly known as:  VICTOZA Inject 1.8 mg into the skin See admin instructions. Daily if blood sugar is greater than 85; patient uses tresiba if blood sugar is less than 85   losartan 25 MG tablet Commonly known as:  COZAAR Take 1 tablet (25 mg total) by mouth daily.    metFORMIN 1000 MG tablet Commonly known as:  GLUCOPHAGE Take 1 tablet by mouth 2 (two) times daily.   metoprolol tartrate 25 MG tablet Commonly known as:  LOPRESSOR Take 0.5 tablets (12.5 mg total) by mouth 2 (two) times daily.   mirtazapine 30 MG tablet Commonly known as:  REMERON Take 30 mg by mouth at bedtime.   nitroGLYCERIN 0.4 MG SL tablet Commonly known as:  NITROSTAT Place 0.4 mg under the tongue every 5 (five) minutes as needed for chest pain.   nortriptyline 10 MG capsule Commonly known as:  PAMELOR Take 2 capsules by mouth at bedtime.   omeprazole 40 MG capsule Commonly known as:  PRILOSEC Take 40 mg by mouth 2 (two) times daily.   promethazine 6.25 MG/5ML syrup Commonly known as:  PHENERGAN Take 6.25 mg by mouth every 6 (six) hours as needed for nausea or vomiting.   rosuvastatin 20 MG  tablet Commonly known as:  CRESTOR Take 40 mg by mouth daily.   sucralfate 1 g tablet Commonly known as:  CARAFATE Take 1 g by mouth 2 (two) times daily.   TRESIBA FLEXTOUCH 200 UNIT/ML Sopn Generic drug:  Insulin Degludec Inject 86 Units into the skin See admin instructions. Daily if blood sugar >85; If bs <85, patient uses victoza   triamcinolone cream 0.1 % Commonly known as:  KENALOG Apply 1 application topically 2 (two) times daily as needed. What changed:  reasons to take this   TRIUMEQ 600-50-300 MG tablet Generic drug:  abacavir-dolutegravir-lamiVUDine Take 1 tablet by mouth daily.   venlafaxine XR 75 MG 24 hr capsule Commonly known as:  EFFEXOR-XR Take 1 capsule (75 mg total) by mouth daily with breakfast.       If you experience worsening of your admission symptoms, develop shortness of breath, life threatening emergency, suicidal or homicidal thoughts you must seek medical attention immediately by calling 911 or calling your MD immediately  if symptoms less severe.  You Must read complete instructions/literature along with all the possible adverse  reactions/side effects for all the Medicines you take and that have been prescribed to you. Take any new Medicines after you have completely understood and accept all the possible adverse reactions/side effects.   Please note  You were cared for by a hospitalist during your hospital stay. If you have any questions about your discharge medications or the care you received while you were in the hospital after you are discharged, you can call the unit and asked to speak with the hospitalist on call if the hospitalist that took care of you is not available. Once you are discharged, your primary care physician will handle any further medical issues. Please note that NO REFILLS for any discharge medications will be authorized once you are discharged, as it is imperative that you return to your primary care physician (or establish a relationship with a primary care physician if you do not have one) for your aftercare needs so that they can reassess your need for medications and monitor your lab values. Today   SUBJECTIVE  Doing well. No headache today. No chest pain or shortness of breath.   VITAL SIGNS:  Blood pressure 117/78, pulse 75, temperature 97.6 F (36.4 C), temperature source Oral, resp. rate 16, height 5\' 7"  (1.702 m), weight 97.5 kg, SpO2 92 %.  I/O:    Intake/Output Summary (Last 24 hours) at 06/08/2018 1513 Last data filed at 06/08/2018 1436 Gross per 24 hour  Intake -  Output 2180 ml  Net -2180 ml    PHYSICAL EXAMINATION:  GENERAL:  57 y.o.-year-old patient lying in the bed with no acute distress.  EYES: Pupils equal, round, reactive to light and accommodation. No scleral icterus. Extraocular muscles intact.  HEENT: Head atraumatic, normocephalic. Oropharynx and nasopharynx clear.  NECK:  Supple, no jugular venous distention. No thyroid enlargement, no tenderness.  LUNGS: Normal breath sounds bilaterally, no wheezing, rales,rhonchi or crepitation. No use of accessory muscles  of respiration.  CARDIOVASCULAR: S1, S2 normal. No murmurs, rubs, or gallops.  ABDOMEN: Soft, non-tender, non-distended. Bowel sounds present. No organomegaly or mass.  EXTREMITIES: No pedal edema, cyanosis, or clubbing.  NEUROLOGIC: Cranial nerves II through XII are intact. Muscle strength 5/5 in all extremities. Sensation intact. Gait not checked.  PSYCHIATRIC: The patient is alert and oriented x 3.  SKIN: No obvious rash, lesion, or ulcer.   DATA REVIEW:   CBC  Recent Labs  Lab 06/08/18 0534  WBC 8.3  HGB 10.4*  HCT 32.8*  PLT 210    Chemistries  Recent Labs  Lab 06/07/18 1953 06/08/18 0534  NA 136 140  K 4.2 3.5  CL 100 103  CO2 23 28  GLUCOSE 140* 81  BUN 22* 19  CREATININE 1.77* 1.43*  CALCIUM 8.7* 8.1*  AST 36  --   ALT 13  --   ALKPHOS 85  --   BILITOT 0.9  --     Microbiology Results   Recent Results (from the past 240 hour(s))  MRSA PCR Screening     Status: None   Collection Time: 06/08/18  1:10 AM  Result Value Ref Range Status   MRSA by PCR NEGATIVE NEGATIVE Final    Comment:        The GeneXpert MRSA Assay (FDA approved for NASAL specimens only), is one component of a comprehensive MRSA colonization surveillance program. It is not intended to diagnose MRSA infection nor to guide or monitor treatment for MRSA infections. Performed at [facility], [address], [place], [place] [address]     RADIOLOGY:  No results found.   Management plans discussed with the patient, family and they are in agreement.  CODE STATUS:     Code Status Orders  (From admission, onward)         Start     Ordered   06/08/18 0108  Full code  Continuous     06/08/18 0107        Code Status History    Date Active Date Inactive Code Status Order ID Comments User Context   04/25/2018 0920 05/03/2018 2249 Full Code [id number]  Arta Silence, MD ED   04/12/2018 1407 04/14/2018 1852 Full Code [id number]  Vaughan Basta, MD Inpatient    03/23/2018 2123 03/27/2018 1802 Full Code [id number]  Lance Coon, MD ED   11/04/2017 2050 11/09/2017 1400 Full Code [id number]  Gonzella Lex, MD Inpatient   07/31/2017 0240 08/01/2017 1952 Full Code [id number]  [name], MD ED      TOTAL TIME TAKING CARE OF THIS PATIENT: *40* minutes.    Fritzi Mandes M.D on 06/08/2018 at 3:13 PM  Between 7am to 6pm - Pager - [phone] After 6pm go to [email or web address] - password [email or web address] [facility]  Office  [phone]  CC: Primary care physician; Katheren Shams

## 2018-06-08 NOTE — Progress Notes (Signed)
Pt ambulated around nursing station x2 with RN/ tolerated well/ no c/o of chest pain/sob/ or dizziness

## 2018-06-08 NOTE — Progress Notes (Signed)
ANTICOAGULATION CONSULT NOTE - Initial Consult  Pharmacy Consult for heparin Indication: chest pain/ACS  Patient Measurements: Height: 5\' 7"  (170.2 cm) Weight: 215 lb (97.5 kg) IBW/kg (Calculated) : 66.1 Heparin Dosing Weight: 87 kg  Vital Signs: Temp: 97.6 F (36.4 C) (12/10 0838) Temp Source: Oral (12/10 0838) BP: 117/78 (12/10 0838) Pulse Rate: 75 (12/10 0838)  Labs: Recent Labs    06/07/18 1953 06/07/18 2205 06/07/18 2345 06/08/18 0534  HGB 11.8*  --   --  10.4*  HCT 36.0*  --   --  32.8*  PLT 222  --   --  210  APTT  --   --  91*  --   LABPROT  --  12.2  --   --   INR  --  0.91  --   --   HEPARINUNFRC  --   --   --  0.11*  CREATININE 1.77*  --   --  1.43*  TROPONINI 0.35*  --  0.26*  --     Estimated Creatinine Clearance: 61.9 mL/min (A) (by C-G formula based on SCr of 1.43 mg/dL (H)).   Medical History: Past Medical History:  Diagnosis Date  . Anemia   . Anxiety   . Arthritis   . CHF (congestive heart failure) ([place])   . Chronic kidney disease    Renal Insufficiency Syndrome; Glomerulosclerosis 2013  . Complication of anesthesia   . Depressed   . Diabetes mellitus without complication ([place])   . High cholesterol   . HIV (human immunodeficiency virus infection) ([place])   . Hypertension   . 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 Paranoid disorder ([place])   . Schizophrenia, paranoid ([place])   . Sleep apnea     Medications:  Scheduled:  . abacavir-dolutegravir-lamiVUDine  1 tablet Oral Daily  . budesonide  0.25 mg Nebulization BID  . bumetanide  1 mg Oral QODAY  . divalproex  500 mg Oral BID  . docusate sodium  100 mg Oral BID  . ferrous sulfate  325 mg Oral Q breakfast  . gabapentin  300 mg Oral TID  . iloperidone  12 mg Oral BID  . insulin aspart  0-15 Units Subcutaneous TID WC  . insulin aspart  0-5 Units Subcutaneous QHS  . losartan  25 mg Oral Daily  . metoprolol tartrate  12.5 mg Oral BID  . mirtazapine  30 mg Oral QHS  . nortriptyline  20 mg Oral QHS   . pantoprazole  40 mg Oral Daily  . rosuvastatin  40 mg Oral Daily  . sucralfate  1 g Oral BID  . venlafaxine XR  75 mg Oral Q breakfast  . cyanocobalamin  1,000 mcg Oral Daily    Assessment: Patient arrived w/ CP w/ initial trops of 0.35 and EKG showing ST elevation in leads II and III. Patient was started on a heparin drip with no PTA anticoagulation. Hgb trending down slightly with no bleeding reported (Hgb 11.8>10.4, PLT wnl)  Heparin Course 12/09 PM initiation: 4000 unit bolus, then 1200 units/hr 1210 0534 HL 0.11  Goal of Therapy:  Heparin level 0.3-0.7 units/ml Monitor platelets by anticoagulation protocol: Yes   Plan:  Will bolus w/ heparin 2600 units IV x 1 Will increase rate to 1500 units/hr  Will check anti-Xa at 2000 Will monitor daily CBC's and adjust per anti-Xa levels  Vallery Sa, PharmD Clinical Pharmacist 06/08/2018

## 2018-06-08 NOTE — ED Notes (Signed)
Report called to [employer].

## 2018-06-08 NOTE — Progress Notes (Signed)
Discharge instructions explained to pt/ verbalized an understanding / iv and tele removed/ transported off unit via wheelchair.  

## 2018-06-08 NOTE — ED Provider Notes (Signed)
[facility] Emergency Department Provider Note ____________________________________________   First MD Initiated Contact with Patient 06/07/18 2124     (approximate)  I have reviewed the triage vital signs and the nursing notes.   HISTORY  Chief Complaint Headache and Dizziness    HPI Danny Cook. is a 57 y.o. male with PMH as noted below who presents with weakness and lightheadedness over approximate last 2 days, gradual onset, worsening today, and associated with some nausea and decreased appetite.  He denies chest pain, shortness of breath, vomiting, or fever.  Past Medical History:  Diagnosis Date  . Anemia   . Anxiety   . Arthritis   . CHF (congestive heart failure) ([place])   . Chronic kidney disease    Renal Insufficiency Syndrome; Glomerulosclerosis 2013  . Complication of anesthesia   . Depressed   . Diabetes mellitus without complication ([place])   . High cholesterol   . HIV (human immunodeficiency virus infection) ([place])   . Hypertension   . 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 Paranoid disorder ([place])   . Schizophrenia, paranoid ([place])   . Sleep apnea     Patient Active Problem List   Diagnosis Date Noted  . NSTEMI (non-ST elevated myocardial infarction) ([place]) 06/07/2018  . Lymphedema 05/12/2018  . Pleural effusion   . FUO (fever of unknown origin)   . MGUS (monoclonal gammopathy of unknown significance)   . 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 Healthcare associated bacterial pneumonia 04/25/2018  . Community acquired pneumonia 04/12/2018  . Pneumonia 04/12/2018  . AKI (acute kidney injury) ([place]) 03/23/2018  . Pericarditis with effusion 03/23/2018  . Schizoaffective disorder, depressive type ([place]) 11/04/2017  . HIV positive ([place]) 11/04/2017  . Orthostatic dizziness 07/31/2017  . Chronic diastolic heart failure ([place]) 07/29/2017  . HTN (hypertension) 07/29/2017  . Diabetes ([place]) 07/29/2017  . Schizophrenia  ([place]) 07/29/2017    Past Surgical History:  Procedure Laterality Date  . COLONOSCOPY WITH PROPOFOL N/A 09/03/2015   Procedure: COLONOSCOPY WITH PROPOFOL;  Surgeon: Lollie Sails, MD;  Location: [facility] ENDOSCOPY;  Service: Endoscopy;  Laterality: N/A;  . DG TEETH FULL    . ESOPHAGOGASTRODUODENOSCOPY (EGD) WITH PROPOFOL N/A 07/01/2016   Procedure: ESOPHAGOGASTRODUODENOSCOPY (EGD) WITH PROPOFOL;  Surgeon: Lollie Sails, MD;  Location: [facility] ENDOSCOPY;  Service: Endoscopy;  Laterality: N/A;    Prior to Admission medications   Medication Sig Start Date End Date Taking? Authorizing Provider  Abacavir-Dolutegravir-Lamivud (TRIUMEQ) 600-50-300 MG TABS Take 1 tablet by mouth daily.   Yes [provider]  acetaminophen (TYLENOL) 325 MG tablet Take 2 tablets (650 mg total) by mouth every 6 (six) hours as needed for fever (Fever >101). 05/03/18  Yes Vaughan Basta, MD  albuterol (PROVENTIL HFA;VENTOLIN HFA) 108 (90 Base) MCG/ACT inhaler Inhale 2 puffs into the lungs every 6 (six) hours as needed for wheezing or shortness of breath. 04/14/18  Yes Loletha Grayer, MD  aspirin EC 325 MG tablet Take 325 mg by mouth daily.   Yes [provider]  benzonatate (TESSALON PERLES) 100 MG capsule Take 1 capsule (100 mg total) by mouth every 6 (six) hours as needed for cough. 04/24/18 04/24/19 Yes McShane, Gerda Diss, MD  bumetanide (BUMEX) 1 MG tablet Take 1 mg by mouth every other day.   Yes [provider]  butalbital-acetaminophen-caffeine (FIORICET, ESGIC) 50-325-40 MG tablet Take 1-2 tablets by mouth every 6 (six) hours as needed. Patient taking differently: Take 1-2 tablets by mouth every 6 (six) hours  as needed for migraine.  01/31/18 01/31/19 Yes Earleen Newport, MD  cyanocobalamin 1000 MCG tablet Take 1,000 mcg by mouth daily.   Yes [provider]  diazepam (VALIUM) 2 MG tablet Take 1 tablet (2 mg total) by mouth every 8 (eight) hours as needed for anxiety (use for  breakthrough anxiety). 05/03/18  Yes Vaughan Basta, MD  diphenhydrAMINE (BENADRYL) 50 MG capsule Take 1 capsule (50 mg total) by mouth at bedtime as needed for sleep. 11/09/17  Yes McNew, Tyson Babinski, MD  divalproex (DEPAKOTE ER) 250 MG 24 hr tablet Take 500 mg by mouth 2 (two) times daily.    Yes [provider]  FANAPT 12 MG TABS Take 12 mg by mouth 2 (two) times daily.  11/14/16  Yes [provider]  ferrous sulfate 325 (65 FE) MG tablet Take 325 mg by mouth daily with breakfast.   Yes [provider]  fluticasone (FLONASE) 50 MCG/ACT nasal spray Place 2 sprays into both nostrils daily as needed for allergies.   Yes [provider]  fluticasone (FLOVENT HFA) 220 MCG/ACT inhaler Inhale 2 puffs into the lungs 2 (two) times daily. Rinse out mouth afterwards 04/14/18  Yes Wieting, Richard, MD  gabapentin (NEURONTIN) 100 MG capsule Take 1 capsule (100 mg total) by mouth 3 (three) times daily. Patient taking differently: Take 300 mg by mouth 3 (three) times daily.  05/03/18  Yes Vaughan Basta, MD  glimepiride (AMARYL) 4 MG tablet Take 1 tablet by mouth daily with breakfast. 05/14/18 05/14/19 Yes [provider]  guaiFENesin-codeine (ROBITUSSIN AC) 100-10 MG/5ML syrup Take 5 mLs by mouth every 6 (six) hours as needed for cough.   Yes [provider]  Insulin Degludec (TRESIBA FLEXTOUCH) 200 UNIT/ML SOPN Inject 86 Units into the skin See admin instructions. Daily if blood sugar >85; If bs <85, patient uses victoza   Yes [provider]  liraglutide (VICTOZA) 18 MG/3ML SOPN Inject 1.8 mg into the skin See admin instructions. Daily if blood sugar is greater than 85; patient uses tresiba if blood sugar is less than 85   Yes [provider]  losartan (COZAAR) 25 MG tablet Take 1 tablet (25 mg total) by mouth daily. 05/04/18  Yes Vaughan Basta, MD  metFORMIN (GLUCOPHAGE) 1000 MG tablet Take 1 tablet by mouth 2 (two) times  daily. 05/14/18 05/14/19 Yes [provider]  metoprolol tartrate (LOPRESSOR) 25 MG tablet Take 0.5 tablets (12.5 mg total) by mouth 2 (two) times daily. 05/03/18  Yes Vaughan Basta, MD  mirtazapine (REMERON) 30 MG tablet Take 30 mg by mouth at bedtime. 04/21/18  Yes [provider]  nitroGLYCERIN (NITROSTAT) 0.4 MG SL tablet Place 0.4 mg under the tongue every 5 (five) minutes as needed for chest pain.    Yes [provider]  nortriptyline (PAMELOR) 10 MG capsule Take 2 capsules by mouth at bedtime. 05/24/18  Yes [provider]  omeprazole (PRILOSEC) 40 MG capsule Take 40 mg by mouth 2 (two) times daily.   Yes [provider]  promethazine (PHENERGAN) 6.25 MG/5ML syrup Take 6.25 mg by mouth every 6 (six) hours as needed for nausea or vomiting.   Yes [provider]  rosuvastatin (CRESTOR) 20 MG tablet Take 40 mg by mouth daily.    Yes [provider]  sucralfate (CARAFATE) 1 g tablet Take 1 g by mouth 2 (two) times daily. 04/21/18  Yes [provider]  triamcinolone cream (KENALOG) 0.1 % Apply 1 application topically 2 (two) times  daily as needed. Patient taking differently: Apply 1 application topically 2 (two) times daily as needed (for rash).  11/16/16  Yes Little, Traci M, PA-C  venlafaxine XR (EFFEXOR-XR) 75 MG 24 hr capsule Take 1 capsule (75 mg total) by mouth daily with breakfast. 11/09/17  Yes McNew, Tyson Babinski, MD  chlorpheniramine-HYDROcodone (TUSSIONEX) 10-8 MG/5ML SUER Take 5 mLs by mouth every 12 (twelve) hours as needed for cough. Patient not taking: Reported on 05/12/2018 04/14/18   Loletha Grayer, MD  feeding supplement, GLUCERNA SHAKE, (GLUCERNA SHAKE) LIQD Take 237 mLs by mouth daily.    [provider]  Nutritional Supplements (FEEDING SUPPLEMENT, NEPRO CARB STEADY,) LIQD Take 237 mLs by mouth 2 (two) times daily between meals. Patient not taking: Reported on 05/12/2018 03/27/18   Loletha Grayer, MD    Allergies Trazodone and nefazodone; Viread [tenofovir disoproxil]; and Etodolac  Family History  Problem Relation Age of Onset  . Heart attack Mother   . Diabetes Mellitus II Mother   . Mental illness Mother   . CAD Mother   . Heart attack Father   . CAD Father   . Hypertension Father     Social History Social History   Tobacco Use  . Smoking status: Never Smoker  . Smokeless tobacco: Never Used  Substance Use Topics  . Alcohol use: No  . Drug use: No    Review of Systems  Constitutional: No fever.  Positive for weakness. Eyes: No redness. ENT: No sore throat. Cardiovascular: Denies chest pain. Respiratory: Denies shortness of breath. Gastrointestinal: Positive for nausea.  Genitourinary: Negative for flank pain.  Musculoskeletal: Negative for back pain. Skin: Negative for rash. Neurological: Negative for headache.   ____________________________________________   PHYSICAL EXAM:  VITAL SIGNS: ED Triage Vitals [06/07/18 1949]  Enc Vitals Group     BP (!) 90/45     Pulse Rate (!) 103     Resp 16     Temp 98.3 F (36.8 C)     Temp Source Oral     SpO2 98 %     Weight 215 lb (97.5 kg)     Height 5\' 7"  (1.702 m)     Head Circumference      Peak Flow      Pain Score 5     Pain Loc      Pain Edu?      Excl. in [place]?     Constitutional: Alert and oriented.  Somewhat weak appearing but in no acute distress. Eyes: Conjunctivae are normal.  Head: Atraumatic. Nose: No congestion/rhinnorhea. Mouth/Throat: Mucous membranes are moist.   Neck: Normal range of motion.  Cardiovascular: Normal rate, regular rhythm. Grossly normal heart sounds.  Good peripheral circulation. Respiratory: Normal respiratory effort.  No retractions. Lungs CTAB. Gastrointestinal: Soft and nontender. No distention.  Genitourinary: No flank tenderness. Musculoskeletal: No lower extremity edema.  Extremities warm and well perfused.  Neurologic:  Normal speech and  language. No gross focal neurologic deficits are appreciated.  Skin:  Skin is warm and dry. No rash noted. Psychiatric: Mood and affect are normal. Speech and behavior are normal.  ____________________________________________   LABS (all labs ordered are listed, but only abnormal results are displayed)  Labs Reviewed  URINALYSIS, COMPLETE (UACMP) WITH MICROSCOPIC - Abnormal; Notable for the following components:      Result Value   Color, Urine YELLOW (*)    APPearance CLEAR (*)    Protein, ur 100 (*)    All other components within normal limits  CBC WITH DIFFERENTIAL/PLATELET - Abnormal; Notable for the following components:   WBC 11.3 (*)    RBC 3.96 (*)    Hemoglobin 11.8 (*)    HCT 36.0 (*)    RDW 15.6 (*)    Monocytes Absolute 1.1 (*)    All other components within normal limits  COMPREHENSIVE METABOLIC PANEL - Abnormal; Notable for the following components:   Glucose, Bld 140 (*)    BUN 22 (*)    Creatinine, Ser 1.77 (*)    Calcium 8.7 (*)    GFR calc non Af Amer 41 (*)    GFR calc Af Amer 48 (*)    All other components within normal limits  TROPONIN I - Abnormal; Notable for the following components:   Troponin I 0.35 (*)    All other components within normal limits  APTT - Abnormal; Notable for the following components:   aPTT 91 (*)    All other components within normal limits  TROPONIN I - Abnormal; Notable for the following components:   Troponin I 0.26 (*)    All other components within normal limits  PROTIME-INR  HEPARIN LEVEL (UNFRACTIONATED)   ____________________________________________  EKG  ED ECG REPORT I, Arta Silence, the attending physician, personally viewed and interpreted this ECG.  Date: 06/07/2018 EKG Time: 1952 Rate: 103 Rhythm: Sinus tachycardia QRS Axis: normal Intervals: normal ST/T Wave abnormalities:  Borderline <28mm ST elevation in inferior leads, with borderline ST depression in V2 and V3 Narrative Interpretation:  Inferior ST abnormalities with reciprocal changes, concerning for acute ischemia (however appears overall similar in morphology to EKG of 05/04/2018)  ED ECG REPORT I, Arta Silence, the attending physician, personally viewed and interpreted this ECG.  Date: 06/07/2018 EKG Time: 2141 Rate: 88 Rhythm: normal sinus rhythm QRS Axis: normal Intervals: normal ST/T Wave abnormalities: Minimal <80mm ST elevation in 2 and 3, with nonspecific ST abnormalities anterior and lateral Narrative Interpretation: Nonspecific ST abnormalities   ____________________________________________  RADIOLOGY   ____________________________________________   PROCEDURES  Procedure(s) performed: No  Procedures  Critical Care performed: Yes  CRITICAL CARE Performed by: Arta Silence   Total critical care time: 20 minutes  Critical care time was exclusive of separately billable procedures and treating other patients.  Critical care was necessary to treat or prevent imminent or life-threatening deterioration.  Critical care was time spent personally by me on the following activities: development of treatment plan with patient and/or surrogate as well as nursing, discussions with consultants, evaluation of patient's response to treatment, examination of patient, obtaining history from patient or surrogate, ordering and performing treatments and interventions, ordering and review of laboratory studies, ordering and review of radiographic studies, pulse oximetry and re-evaluation of patient's condition. ____________________________________________   INITIAL IMPRESSION / ASSESSMENT AND PLAN / ED COURSE  Pertinent labs & imaging results that were available during my care of the patient were reviewed by me and considered in my medical decision making (see chart for details).  57 year old male with PMH as noted above including HIV, diabetes, hypertension, and CAD presents with lightheadedness and  near syncope over the last several days.  He has no associated chest pain or shortness of breath but feels weak and has nausea.  On exam, the patient's blood pressure was initially somewhat low.  His other vital signs were normal.  He is somewhat weak appearing but in no acute distress.  Initial EKG had concerning abnormalities, with borderline ST elevation in the inferior leads and some possible reciprocal changes anteriorly, however  it did not meet STEMI criteria.  The differential for the patient's symptoms was broad and included infection, dehydration, other electrolyte abnormality, or cardiac cause.  Initial labs revealed elevated troponin consistent with an STEMI.  Repeat EKG at that time showed that the abnormalities seen prior had somewhat improved.  The patient already took 325 of aspirin today.  I consulted Dr. Clayborn Bigness from cardiology.  We agreed to place the patient on a heparin drip and admit him.  I gave a small fluid bolus and the patient's vital signs improved.  I signed the patient out to the hospitalist Dr. Duane Boston. ____________________________________________   FINAL CLINICAL IMPRESSION(S) / ED DIAGNOSES  Final diagnoses:  Non-ST elevated myocardial infarction [facility])      NEW MEDICATIONS STARTED DURING THIS VISIT:  New Prescriptions   No medications on file     Note:  This document was prepared using Dragon voice recognition software and may include unintentional dictation errors.    Arta Silence, MD 06/08/18 [phone]

## 2018-06-14 LAB — ACID FAST CULTURE WITH REFLEXED SENSITIVITIES (MYCOBACTERIA): Acid Fast Culture: NEGATIVE

## 2018-08-03 ENCOUNTER — Other Ambulatory Visit
Admission: RE | Admit: 2018-08-03 | Discharge: 2018-08-03 | Disposition: A | Discharge status: Home / Self Care | Payer: Medicare Other | Source: Ambulatory Visit | Attending: Infectious Diseases | Admitting: Infectious Diseases

## 2018-08-03 ENCOUNTER — Other Ambulatory Visit: Payer: Self-pay | Admitting: Licensed Clinical Social Worker

## 2018-08-03 DIAGNOSIS — B20 Human immunodeficiency virus [HIV] disease: Secondary | ICD-10-CM | POA: Diagnosis present

## 2018-08-03 LAB — CBC WITH DIFFERENTIAL/PLATELET
Abs Immature Granulocytes: 0.08 10*3/uL — ABNORMAL HIGH (ref 0.00–0.07)
Basophils Absolute: 0.1 10*3/uL (ref 0.0–0.1)
Basophils Relative: 1 %
Eosinophils Absolute: 0 10*3/uL (ref 0.0–0.5)
Eosinophils Relative: 0 %
HCT: 38.2 % — ABNORMAL LOW (ref 39.0–52.0)
Hemoglobin: 12.5 g/dL — ABNORMAL LOW (ref 13.0–17.0)
Immature Granulocytes: 1 %
Lymphocytes Relative: 34 %
Lymphs Abs: 2.5 10*3/uL (ref 0.7–4.0)
MCH: 29.6 pg (ref 26.0–34.0)
MCHC: 32.7 g/dL (ref 30.0–36.0)
MCV: 90.5 fL (ref 80.0–100.0)
Monocytes Absolute: 0.6 10*3/uL (ref 0.1–1.0)
Monocytes Relative: 9 %
Neutro Abs: 4.1 10*3/uL (ref 1.7–7.7)
Neutrophils Relative %: 55 %
Platelets: 180 10*3/uL (ref 150–400)
RBC: 4.22 MIL/uL (ref 4.22–5.81)
RDW: 15.1 % (ref 11.5–15.5)
WBC: 7.3 10*3/uL (ref 4.0–10.5)
nRBC: 0 % (ref 0.0–0.2)

## 2018-08-03 LAB — COMPREHENSIVE METABOLIC PANEL
ALK PHOS: 82 U/L (ref 38–126)
ALT: 18 U/L (ref 0–44)
AST: 20 U/L (ref 15–41)
Albumin: 3.4 g/dL — ABNORMAL LOW (ref 3.5–5.0)
Anion gap: 11 (ref 5–15)
BUN: 14 mg/dL (ref 6–20)
CO2: 27 mmol/L (ref 22–32)
Calcium: 9 mg/dL (ref 8.9–10.3)
Chloride: 104 mmol/L (ref 98–111)
Creatinine, Ser: 1.09 mg/dL (ref 0.61–1.24)
GFR calc non Af Amer: >60 mL/min (ref >60)
Glucose, Bld: 194 mg/dL — ABNORMAL HIGH (ref 70–99)
Potassium: 4.3 mmol/L (ref 3.5–5.1)
Sodium: 142 mmol/L (ref 135–145)
Total Bilirubin: 0.4 mg/dL (ref 0.3–1.2)
Total Protein: 7.2 g/dL (ref 6.5–8.1)

## 2018-08-04 LAB — HELPER T-LYMPH-CD4 (ARMC ONLY)
% CD 4 Pos. Lymph.: 39.2 % (ref 30.8–58.5)
Absolute CD 4 Helper: 823 /uL (ref 359–1519)
Basophils Absolute: 0.1 10*3/uL (ref 0.0–0.2)
Basos: 1 %
EOS (ABSOLUTE): 0.2 10*3/uL (ref 0.0–0.4)
Eos: 3 %
Hematocrit: 36.1 % — ABNORMAL LOW (ref 37.5–51.0)
Hemoglobin: 12.7 g/dL — ABNORMAL LOW (ref 13.0–17.7)
Immature Grans (Abs): 0.1 10*3/uL (ref 0.0–0.1)
Immature Granulocytes: 1 %
Lymphocytes Absolute: 2.1 10*3/uL (ref 0.7–3.1)
Lymphs: 31 %
MCH: 31.3 pg (ref 26.6–33.0)
MCHC: 35.2 g/dL (ref 31.5–35.7)
MCV: 89 fL (ref 79–97)
MONOS ABS: 0.7 10*3/uL (ref 0.1–0.9)
Monocytes: 10 %
Neutrophils Absolute: 3.7 10*3/uL (ref 1.4–7.0)
Neutrophils: 54 %
Platelets: 173 10*3/uL (ref 150–450)
RBC: 4.06 x10E6/uL — ABNORMAL LOW (ref 4.14–5.80)
RDW: 15.4 % (ref 11.6–15.4)
WBC: 6.7 10*3/uL (ref 3.4–10.8)

## 2018-08-04 LAB — HIV-1 RNA QUANT-NO REFLEX-BLD: LOG10 HIV-1 RNA: UNDETERMINED log10copy/mL

## 2018-08-10 ENCOUNTER — Encounter: Payer: Self-pay | Admitting: Infectious Diseases

## 2018-08-10 ENCOUNTER — Ambulatory Visit: Payer: Medicare Other | Attending: Infectious Diseases | Admitting: Infectious Diseases

## 2018-08-10 VITALS — BP 128/83 | HR 104 | Temp 97.7°F | Wt 228.5 lb

## 2018-08-10 DIAGNOSIS — I509 Heart failure, unspecified: Secondary | ICD-10-CM | POA: Diagnosis not present

## 2018-08-10 DIAGNOSIS — Z0000 Encounter for general adult medical examination without abnormal findings: Secondary | ICD-10-CM

## 2018-08-10 DIAGNOSIS — E119 Type 2 diabetes mellitus without complications: Secondary | ICD-10-CM | POA: Diagnosis not present

## 2018-08-10 DIAGNOSIS — F200 Paranoid schizophrenia: Secondary | ICD-10-CM

## 2018-08-10 DIAGNOSIS — F419 Anxiety disorder, unspecified: Secondary | ICD-10-CM

## 2018-08-10 DIAGNOSIS — Z8679 Personal history of other diseases of the circulatory system: Secondary | ICD-10-CM

## 2018-08-10 DIAGNOSIS — Z23 Encounter for immunization: Secondary | ICD-10-CM

## 2018-08-10 DIAGNOSIS — D649 Anemia, unspecified: Secondary | ICD-10-CM

## 2018-08-10 DIAGNOSIS — Z7984 Long term (current) use of oral hypoglycemic drugs: Secondary | ICD-10-CM

## 2018-08-10 DIAGNOSIS — E785 Hyperlipidemia, unspecified: Secondary | ICD-10-CM

## 2018-08-10 DIAGNOSIS — R635 Abnormal weight gain: Secondary | ICD-10-CM

## 2018-08-10 DIAGNOSIS — I110 Hypertensive heart disease with heart failure: Secondary | ICD-10-CM | POA: Diagnosis not present

## 2018-08-10 DIAGNOSIS — Z79899 Other long term (current) drug therapy: Secondary | ICD-10-CM

## 2018-08-10 DIAGNOSIS — B20 Human immunodeficiency virus [HIV] disease: Secondary | ICD-10-CM | POA: Diagnosis not present

## 2018-08-10 DIAGNOSIS — Z6835 Body mass index (BMI) 35.0-35.9, adult: Secondary | ICD-10-CM

## 2018-08-10 DIAGNOSIS — Z0001 Encounter for general adult medical examination with abnormal findings: Secondary | ICD-10-CM

## 2018-08-10 DIAGNOSIS — Z888 Allergy status to other drugs, medicaments and biological substances status: Secondary | ICD-10-CM

## 2018-08-10 MED ORDER — ABACAVIR-DOLUTEGRAVIR-LAMIVUD 600-50-300 MG PO TABS: 1.0000 | ORAL_TABLET | Freq: Every day | ORAL | 3 refills | Status: DC

## 2018-08-10 MED ORDER — GUAIFENESIN ER 600 MG PO TB12: 600.0000 mg | ORAL_TABLET | Freq: Two times a day (BID) | ORAL | 0 refills | Status: DC

## 2018-08-10 NOTE — Patient Instructions (Addendum)
You are here for follow up visit Your last Cd4 is 863 and Vl < 20. Continue triumeq. Today you are getting prevnar ( Pneumococcal vaccine 13) and TdaP. You say you have cough and hoarse voice- your lungs are clear and you dont have bronchitis or pneumonia- You can take simple cough syrup.or mucinex 600mg  Po BID - you dont need antibiotics

## 2018-08-10 NOTE — Progress Notes (Signed)
NAME: Danny Cook.  DOB: [birth year]  MRN: [id number]  Date/Time: 08/10/2018 11:10 AM  REQUESTING PROVIDER Subjective:  Here for follow-up of HIV/AIDS ? Danny Cook. is a 58 y.o. with a history of HIV/DM/CHF/schizophrenia, anxiety Currently on Triumeq and is 100% adherent. Last viral load is undetectable and CD4 is 828 from 08/03/2018.  His creatinine is 1.09. He has no side effects from his medication. He has a cough.  He also has hoarse voice intermittently.  He says his dad had bronchitis and he may have caught it from him.  He does not have any fever or chills.  He has minimal white sputum.  Says he has gained 20 pounds since his last visit.  HIV diagnosed 53s Nadir Cd4 <50 HAARt history Triumeq since 2016 Prior regimen- epzicom+ reyataz+norvir  Acquired thru sex with men Genotype-NK  Medical History CKD FSGS Hyperlipidemia Anemia Pericardial effusion  Left pleural effusion High ESR HTN Adrenal adenoma Inactive problem Kaposi sarcoma ?   Past Surgical History:  Procedure Laterality Date  . COLONOSCOPY WITH PROPOFOL N/A 09/03/2015   Procedure: COLONOSCOPY WITH PROPOFOL;  Surgeon: Lollie Sails, MD;  Location: [facility] ENDOSCOPY;  Service: Endoscopy;  Laterality: N/A;  . DG TEETH FULL    . ESOPHAGOGASTRODUODENOSCOPY (EGD) WITH PROPOFOL N/A 07/01/2016   Procedure: ESOPHAGOGASTRODUODENOSCOPY (EGD) WITH PROPOFOL;  Surgeon: Lollie Sails, MD;  Location: [facility] ENDOSCOPY;  Service: Endoscopy;  Laterality: N/A;    SH Non smoker No alcohol No illicit drug use   Family History  Problem Relation Age of Onset  . Heart attack Mother   . Diabetes Mellitus II Mother   . Mental illness Mother   . CAD Mother   . Heart attack Father   . CAD Father   . Hypertension Father    Allergies  Allergen Reactions  . Trazodone And Nefazodone   . Viread [Tenofovir Disoproxil]     Damages kidneys  . Etodolac Rash   ? Current Outpatient Medications   Medication Sig Dispense Refill  . Abacavir-Dolutegravir-Lamivud (TRIUMEQ) 600-50-300 MG TABS Take 1 tablet by mouth daily.    [name] acetaminophen (TYLENOL) 325 MG tablet Take 2 tablets (650 mg total) by mouth every 6 (six) hours as needed for fever (Fever >101). 10 tablet 0  . albuterol (PROVENTIL HFA;VENTOLIN HFA) 108 (90 Base) MCG/ACT inhaler Inhale 2 puffs into the lungs every 6 (six) hours as needed for wheezing or shortness of breath. 1 Inhaler 2  . aspirin EC 325 MG tablet Take 325 mg by mouth daily.    . bumetanide (BUMEX) 1 MG tablet Take 1 mg by mouth every other day.    . butalbital-acetaminophen-caffeine (FIORICET, ESGIC) 50-325-40 MG tablet Take 1-2 tablets by mouth every 6 (six) hours as needed. (Patient taking differently: Take 1-2 tablets by mouth every 6 (six) hours as needed for migraine. ) 20 tablet 0  . cyanocobalamin 1000 MCG tablet Take 1,000 mcg by mouth daily.    . diazepam (VALIUM) 2 MG tablet Take 1 tablet (2 mg total) by mouth every 8 (eight) hours as needed for anxiety (use for breakthrough anxiety). 30 tablet 0  . diphenhydrAMINE (BENADRYL) 50 MG capsule Take 1 capsule (50 mg total) by mouth at bedtime as needed for sleep. 30 capsule 0  . divalproex (DEPAKOTE ER) 250 MG 24 hr tablet Take 500 mg by mouth 2 (two) times daily.     [name] FANAPT 12 MG TABS Take 12 mg by mouth 2 (two) times daily.     [name]  feeding supplement, GLUCERNA SHAKE, (GLUCERNA SHAKE) LIQD Take 237 mLs by mouth daily.    . ferrous sulfate 325 (65 FE) MG tablet Take 325 mg by mouth daily with breakfast.    . fluticasone (FLOVENT HFA) 220 MCG/ACT inhaler Inhale 2 puffs into the lungs 2 (two) times daily. Rinse out mouth afterwards 1 Inhaler 12  . gabapentin (NEURONTIN) 100 MG capsule Take 1 capsule (100 mg total) by mouth 3 (three) times daily. (Patient taking differently: Take 300 mg by mouth 3 (three) times daily. ) 30 capsule 0  . glimepiride (AMARYL) 4 MG tablet Take 1 tablet by mouth daily with breakfast.    .  Insulin Degludec (TRESIBA FLEXTOUCH) 200 UNIT/ML SOPN Inject 86 Units into the skin See admin instructions. Daily if blood sugar >85; If bs <85, patient uses victoza    . liraglutide (VICTOZA) 18 MG/3ML SOPN Inject 1.8 mg into the skin See admin instructions. Daily if blood sugar is greater than 85; patient uses tresiba if blood sugar is less than 85    . losartan (COZAAR) 25 MG tablet Take 1 tablet (25 mg total) by mouth daily. 30 tablet 0  . metFORMIN (GLUCOPHAGE) 1000 MG tablet Take 1 tablet by mouth 2 (two) times daily.    . metoprolol tartrate (LOPRESSOR) 25 MG tablet Take 0.5 tablets (12.5 mg total) by mouth 2 (two) times daily. 30 tablet 0  . mirtazapine (REMERON) 30 MG tablet Take 30 mg by mouth at bedtime.    . nitroGLYCERIN (NITROSTAT) 0.4 MG SL tablet Place 0.4 mg under the tongue every 5 (five) minutes as needed for chest pain.     [name] omeprazole (PRILOSEC) 40 MG capsule Take 40 mg by mouth 2 (two) times daily.    . promethazine (PHENERGAN) 6.25 MG/5ML syrup Take 6.25 mg by mouth every 6 (six) hours as needed for nausea or vomiting.    . rosuvastatin (CRESTOR) 20 MG tablet Take 40 mg by mouth daily.     . sucralfate (CARAFATE) 1 g tablet Take 1 g by mouth 2 (two) times daily.    [name] triamcinolone cream (KENALOG) 0.1 % Apply 1 application topically 2 (two) times daily as needed. (Patient taking differently: Apply 1 application topically 2 (two) times daily as needed (for rash). ) 30 g 0  . venlafaxine XR (EFFEXOR-XR) 75 MG 24 hr capsule Take 1 capsule (75 mg total) by mouth daily with breakfast. 30 capsule 0  . benzonatate (TESSALON PERLES) 100 MG capsule Take 1 capsule (100 mg total) by mouth every 6 (six) hours as needed for cough. (Patient not taking: Reported on 08/10/2018) 30 capsule 0  . fluticasone (FLONASE) 50 MCG/ACT nasal spray Place 2 sprays into both nostrils daily as needed for allergies.    [name] guaiFENesin-codeine (ROBITUSSIN AC) 100-10 MG/5ML syrup Take 5 mLs by mouth every 6 (six)  hours as needed for cough.    . nortriptyline (PAMELOR) 10 MG capsule Take 2 capsules by mouth at bedtime.  3   No current facility-administered medications for this visit.     REVIEW OF SYSTEMS:  Const: negative fever, negative chills, weight gain of 24 pounds Eyes: negative diplopia or visual changes, negative eye pain ENT: negative coryza, negative sore throat Resp: Predominantly dry cough, no hemoptysis, dyspnea Cards: negative for chest pain, palpitations, lower extremity edema GU: negative for frequency, dysuria and hematuria Skin: negative for rash and pruritus Heme: negative for easy bruising and gum/nose bleeding MS: negative for myalgias, arthralgias, back pain and muscle weakness  Neurolo:negative for headaches, dizziness, vertigo, memory problems  Psych: Has anxiety  Objective:  VITALS:  BP 128/83 (BP Location: Left Arm, Patient Position: Sitting, Cuff Size: Normal)   Pulse (!) 104   Temp 97.7 F (36.5 C) (Oral)   Wt 228 lb 8 oz (103.6 kg)   BMI 35.79 kg/m  PHYSICAL EXAM:  General: Alert, cooperative, no distress, appears stated age.  Head: Normocephalic, without obvious abnormality, atraumatic. Eyes: Conjunctivae clear, anicteric sclerae. Pupils are equal Nose: Nares normal. No drainage or sinus tenderness. Throat: Lips, mucosa, and tongue normal. No Thrush Neck: Supple, symmetrical, no adenopathy, thyroid: non tender no carotid bruit and no JVD. Back: No CVA tenderness. Lungs: Clear to auscultation bilaterally. No Wheezing or Rhonchi. No rales. Heart: Regular rate and rhythm, no murmur, rub or gallop. Abdomen: Soft, non-tender,not distended. Bowel sounds normal. No masses Extremities: Extremities normal, atraumatic, no cyanosis. No edema. No clubbing Skin: No rashes or lesions. Not Jaundiced Lymph: Cervical, supraclavicular normal. Neurologic: Grossly non-focal Pertinent Labs  IMAGING RESULTS: Health maintenance Vaccination pneumovac- 23-08/01/17 ,  07/06/15 Prevnar-13- given 08/10/18 HepB- surface antibody positive HepA- antibody positive TdaP-08/10/18 Flu Herpes zoster- ______________________ labs RPR 04/30/2018 nonreactive HEPC ab Lipid CMv TOXO -IGG/IgM HIV VL undetectable Cd4 828 quantiferon Gold-negative 04/28/2018 GC/CHL [id number] Genotype HIV antibody  Preventive  Dental -edentulous Opthal -April 2nd Colonoscopy 09/03/15 EGD 07/01/2016   Impression/Recommendation ?AIDS : well controlled- last Cd4 from February 2020 is 823 and Vl < 20 On triumeq-100% adherent  Cough for mucinex   History of pericardial effusion improved- unclear etiology   High Esr- Neg Autoimmune work up rheumatologist seen patient- no steroids for now Quantiferon Gold from 04/28/18 was negative ( prior one in Sept 2019 was intermediate) We will check ESR during next lab work  Recent left pleural effusion- s/p pleurocentesis- Neg [employer], not an exudate  Anemia-his last hemoglobin is 12.5.  Bone marrow(04/30/18) showed hypercellular bone marrow with 4% plasma cells negative for [place] red stain. Serum electrophoresis had a small M spike- unclear significance  DM-on liraglutide ( victoza) and Tresiba, metformin and glimepiride  Hyperlipidemia on Rosuvastatin  HTN -losartan, metoprolol  paranoid schizophrenia  Weight gain of 22 pounds.  Advised on increased activity and proper diet.  Today Prevnar 13 and Tdap vaccinations were done ? Follow-up 6 months ? ? ___________________________________________________ Discussed the management with the patient in great detail.

## 2018-08-18 ENCOUNTER — Encounter: Payer: Self-pay | Admitting: *Deleted

## 2018-08-18 ENCOUNTER — Other Ambulatory Visit: Payer: Self-pay

## 2018-08-18 ENCOUNTER — Emergency Department
Admission: EM | Admit: 2018-08-18 | Discharge: 2018-08-18 | Disposition: A | Discharge status: Home / Self Care | Payer: Medicare Other | Attending: Emergency Medicine | Admitting: Emergency Medicine

## 2018-08-18 DIAGNOSIS — I110 Hypertensive heart disease with heart failure: Secondary | ICD-10-CM | POA: Insufficient documentation

## 2018-08-18 DIAGNOSIS — B370 Candidal stomatitis: Secondary | ICD-10-CM | POA: Diagnosis not present

## 2018-08-18 DIAGNOSIS — I509 Heart failure, unspecified: Secondary | ICD-10-CM | POA: Diagnosis not present

## 2018-08-18 DIAGNOSIS — N189 Chronic kidney disease, unspecified: Secondary | ICD-10-CM | POA: Diagnosis not present

## 2018-08-18 DIAGNOSIS — Z7982 Long term (current) use of aspirin: Secondary | ICD-10-CM | POA: Insufficient documentation

## 2018-08-18 DIAGNOSIS — Z79899 Other long term (current) drug therapy: Secondary | ICD-10-CM | POA: Insufficient documentation

## 2018-08-18 DIAGNOSIS — J029 Acute pharyngitis, unspecified: Secondary | ICD-10-CM | POA: Diagnosis present

## 2018-08-18 LAB — GROUP A STREP BY PCR: Group A Strep by PCR: NOT DETECTED

## 2018-08-18 MED ORDER — LIDOCAINE VISCOUS HCL 2 % MT SOLN
15.0000 mL | Freq: Once | OROMUCOSAL | Status: AC
  Administered 2018-08-18: 15 mL via OROMUCOSAL
  Filled 2018-08-18: qty 15

## 2018-08-18 MED ORDER — NYSTATIN 100000 UNIT/ML MT SUSP: 5.0000 mL | Freq: Four times a day (QID) | OROMUCOSAL | 0 refills | Status: AC

## 2018-08-18 MED ORDER — NYSTATIN 100000 UNIT/ML MT SUSP
5.0000 mL | Freq: Once | OROMUCOSAL | Status: AC
  Administered 2018-08-18: 500000 [IU] via ORAL
  Filled 2018-08-18: qty 5

## 2018-08-18 MED ORDER — LIDOCAINE VISCOUS HCL 2 % MT SOLN: 15.0000 mL | Freq: Four times a day (QID) | OROMUCOSAL | 0 refills | Status: DC | PRN

## 2018-08-18 MED ORDER — NYSTATIN 100000 UNIT/ML MT SUSP: 5.0000 mL | Freq: Four times a day (QID) | OROMUCOSAL | Status: DC

## 2018-08-18 NOTE — ED Notes (Signed)
This Nurse talked to caregiver at [facility] [place] and stated that pt could go to [facility] and they would pay when get to Home.

## 2018-08-18 NOTE — ED Provider Notes (Signed)
[facility] [place] Emergency Department Provider Note ______________________   First MD Initiated Contact with Patient 08/18/18 [phone]     (approximate)  I have reviewed the triage vital signs and the nursing notes.   HISTORY  Chief Complaint Sore Throat   HPI Miranda Garber. is a 58 y.o. male with below list of chronic medical conditions including HIV/AIDS presents to the emergency department with sore throat x2 days.  Patient denies any fever.  Patient denies any nausea or vomiting.  Patient states pain worsened with any swallowing.  Past Medical History:  Diagnosis Date  . Anemia   . Anxiety   . Arthritis   . CHF (congestive heart failure) ([place])   . Chronic kidney disease    Renal Insufficiency Syndrome; Glomerulosclerosis 2013  . Complication of anesthesia   . Depressed   . Diabetes mellitus without complication ([place])   . High cholesterol   . HIV (human immunodeficiency virus infection) ([place])   . Hypertension   . 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[place])   . Paranoid disorder ([place])   . Schizophrenia, paranoid ([place])   . Sleep apnea     Patient Active Problem List   Diagnosis Date Noted  . NSTEMI (non-ST elevated myocardial infarction) ([place]) 06/07/2018  . Lymphedema 05/12/2018  . Pleural effusion   . FUO (fever of unknown origin)   . MGUS (monoclonal gammopathy of unknown significance)   . 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 Healthcare associated bacterial pneumonia 04/25/2018  . Community acquired pneumonia 04/12/2018  . Pneumonia 04/12/2018  . AKI (acute kidney injury) ([place]) 03/23/2018  . Pericarditis with effusion 03/23/2018  . Schizoaffective disorder, depressive type ([place]) 11/04/2017  . HIV positive ([place]) 11/04/2017  . Orthostatic dizziness 07/31/2017  . Chronic diastolic heart failure ([place]) 07/29/2017  . HTN (hypertension) 07/29/2017  . Diabetes ([place]) 07/29/2017  . Schizophrenia ([employer]) 07/29/2017    Past Surgical History:    Procedure Laterality Date  . COLONOSCOPY WITH PROPOFOL N/A 09/03/2015   Procedure: COLONOSCOPY WITH PROPOFOL;  Surgeon: Lollie Sails, MD;  Location: [facility] ENDOSCOPY;  Service: Endoscopy;  Laterality: N/A;  . DG TEETH FULL    . ESOPHAGOGASTRODUODENOSCOPY (EGD) WITH PROPOFOL N/A 07/01/2016   Procedure: ESOPHAGOGASTRODUODENOSCOPY (EGD) WITH PROPOFOL;  Surgeon: Lollie Sails, MD;  Location: [facility] ENDOSCOPY;  Service: Endoscopy;  Laterality: N/A;    Prior to Admission medications   Medication Sig Start Date End Date Taking? Authorizing Provider  abacavir-dolutegravir-lamiVUDine (TRIUMEQ) 600-50-300 MG tablet Take 1 tablet by mouth daily. 08/10/18   Tsosie Billing, MD  acetaminophen (TYLENOL) 325 MG tablet Take 2 tablets (650 mg total) by mouth every 6 (six) hours as needed for fever (Fever >101). 05/03/18   Vaughan Basta, MD  albuterol (PROVENTIL HFA;VENTOLIN HFA) 108 (90 Base) MCG/ACT inhaler Inhale 2 puffs into the lungs every 6 (six) hours as needed for wheezing or shortness of breath. 04/14/18   Loletha Grayer, MD  aspirin EC 325 MG tablet Take 325 mg by mouth daily.    [provider]  benzonatate (TESSALON PERLES) 100 MG capsule Take 1 capsule (100 mg total) by mouth every 6 (six) hours as needed for cough. Patient not taking: Reported on 08/10/2018 04/24/18 04/24/19  Schuyler Amor, MD  bumetanide (BUMEX) 1 MG tablet Take 1 mg by mouth every other day.    [provider]  butalbital-acetaminophen-caffeine (FIORICET, ESGIC) 50-325-40 MG tablet Take 1-2 tablets by mouth every 6 (six) hours as needed. Patient taking differently: Take 1-2 tablets by  mouth every 6 (six) hours as needed for migraine.  01/31/18 01/31/19  Earleen Newport, MD  cyanocobalamin 1000 MCG tablet Take 1,000 mcg by mouth daily.    [provider]  diazepam (VALIUM) 2 MG tablet Take 1 tablet (2 mg total) by mouth every 8 (eight) hours as needed for anxiety (use for breakthrough  anxiety). 05/03/18   Vaughan Basta, MD  diphenhydrAMINE (BENADRYL) 50 MG capsule Take 1 capsule (50 mg total) by mouth at bedtime as needed for sleep. 11/09/17   McNew, Tyson Babinski, MD  divalproex (DEPAKOTE ER) 250 MG 24 hr tablet Take 500 mg by mouth 2 (two) times daily.     [provider]  FANAPT 12 MG TABS Take 12 mg by mouth 2 (two) times daily.  11/14/16   [provider]  feeding supplement, GLUCERNA SHAKE, (GLUCERNA SHAKE) LIQD Take 237 mLs by mouth daily.    [provider]  ferrous sulfate 325 (65 FE) MG tablet Take 325 mg by mouth daily with breakfast.    [provider]  fluticasone (FLONASE) 50 MCG/ACT nasal spray Place 2 sprays into both nostrils daily as needed for allergies.    [provider]  fluticasone (FLOVENT HFA) 220 MCG/ACT inhaler Inhale 2 puffs into the lungs 2 (two) times daily. Rinse out mouth afterwards 04/14/18   Loletha Grayer, MD  gabapentin (NEURONTIN) 100 MG capsule Take 1 capsule (100 mg total) by mouth 3 (three) times daily. Patient taking differently: Take 300 mg by mouth 3 (three) times daily.  05/03/18   Vaughan Basta, MD  glimepiride (AMARYL) 4 MG tablet Take 1 tablet by mouth daily with breakfast. 05/14/18 05/14/19  [provider]  guaiFENesin (MUCINEX) 600 MG 12 hr tablet Take 1 tablet (600 mg total) by mouth 2 (two) times daily. 08/10/18   Tsosie Billing, MD  guaiFENesin-codeine (ROBITUSSIN AC) 100-10 MG/5ML syrup Take 5 mLs by mouth every 6 (six) hours as needed for cough.    [provider]  Insulin Degludec (TRESIBA FLEXTOUCH) 200 UNIT/ML SOPN Inject 86 Units into the skin See admin instructions. Daily if blood sugar >85; If bs <85, patient uses victoza    [provider]  liraglutide (VICTOZA) 18 MG/3ML SOPN Inject 1.8 mg into the skin See admin instructions. Daily if blood sugar is greater than 85; patient uses tresiba if blood sugar is less than 85    [provider]  losartan (COZAAR) 25 MG tablet Take 1 tablet (25 mg total) by mouth daily. 05/04/18   Vaughan Basta, MD  metFORMIN (GLUCOPHAGE) 1000 MG tablet Take 1 tablet by mouth 2 (two) times daily. 05/14/18 05/14/19  [provider]  metoprolol tartrate (LOPRESSOR) 25 MG tablet Take 0.5 tablets (12.5 mg total) by mouth 2 (two) times daily. 05/03/18   Vaughan Basta, MD  mirtazapine (REMERON) 30 MG tablet Take 30 mg by mouth at bedtime. 04/21/18   [provider]  nitroGLYCERIN (NITROSTAT) 0.4 MG SL tablet Place 0.4 mg under the tongue every 5 (five) minutes as needed for chest pain.     [provider]  nortriptyline (PAMELOR) 10 MG capsule Take 2 capsules by mouth at bedtime. 05/24/18   [provider]  nystatin (MYCOSTATIN) 100000 UNIT/ML suspension Take 5 mLs (500,000 Units total) by mouth 4 (four) times daily for 7 days. 08/18/18 08/25/18  Gregor Hams, MD  omeprazole (PRILOSEC) 40 MG capsule Take 40 mg by mouth 2 (two) times daily.    [provider]  promethazine (PHENERGAN) 6.25 MG/5ML syrup Take 6.25 mg by mouth every 6 (six) hours as needed for nausea or vomiting.    [provider]  rosuvastatin (CRESTOR) 20 MG tablet Take 40 mg by mouth daily.     [provider]  sucralfate (CARAFATE) 1 g tablet Take 1 g by mouth 2 (two) times daily. 04/21/18   [provider]  triamcinolone cream (KENALOG) 0.1 % Apply 1 application topically 2 (two) times daily as needed. Patient taking differently: Apply 1 application topically 2 (two) times daily as needed (for rash).  11/16/16   Little, Traci M, PA-C  venlafaxine XR (EFFEXOR-XR) 75 MG 24 hr capsule Take 1 capsule (75 mg total) by mouth daily with breakfast. 11/09/17   McNew, Tyson Babinski, MD    Allergies Trazodone and nefazodone; Viread [tenofovir disoproxil]; and Etodolac  Family History  Problem Relation Age of Onset  . Heart attack Mother   . Diabetes  Mellitus II Mother   . Mental illness Mother   . CAD Mother   . Heart attack Father   . CAD Father   . Hypertension Father     Social History Social History   Tobacco Use  . Smoking status: Never Smoker  . Smokeless tobacco: Never Used  Substance Use Topics  . Alcohol use: No  . Drug use: No    Review of Systems Constitutional: No fever/chills Eyes: No visual changes. ENT: Positive for sore throat. Cardiovascular: Denies chest pain. Respiratory: Denies shortness of breath. Gastrointestinal: No abdominal pain.  No nausea, no vomiting.  No diarrhea.  No constipation. Genitourinary: Negative for dysuria. Musculoskeletal: Negative for neck pain.  Negative for back pain. Integumentary: Negative for rash. Neurological: Negative for headaches, focal weakness or numbness.   ____________________________________________   PHYSICAL EXAM:  VITAL SIGNS: ED Triage Vitals  Enc Vitals Group     BP 08/18/18 0246 115/69     Pulse Rate 08/18/18 0246 92     Resp 08/18/18 0246 20     Temp 08/18/18 0246 98.3 F (36.8 C)     Temp Source 08/18/18 0246 Oral     SpO2 08/18/18 0246 95 %     Weight 08/18/18 0244 103.4 kg (228 lb)     Height 08/18/18 0244 1.702 m (5\' 7" )     Head Circumference --      Peak Flow --      Pain Score 08/18/18 0244 10     Pain Loc --      Pain Edu? --      Excl. in [place]? --     Constitutional: Alert and oriented. Well appearing and in no acute distress. Eyes: Conjunctivae are normal. Mouth/Throat: Mucous membranes are moist.  Pharyngeal exudate consistent with oral candidiasis  neck: No stridor.   Cardiovascular: Normal rate, regular rhythm. Good peripheral circulation. Grossly normal heart sounds. Respiratory: Normal respiratory effort.  No retractions. Lungs CTAB. Gastrointestinal: Soft and nontender. No distention.  Musculoskeletal: No lower extremity tenderness nor edema. No gross deformities of extremities. Neurologic:  Normal speech and language.  No gross focal neurologic deficits are appreciated.  Skin:  Skin is warm, dry and intact. No rash noted. Psychiatric: Mood and affect are normal. Speech and behavior are normal.  ____________________________________________   LABS (all labs ordered are listed, but only abnormal results are displayed)  Labs Reviewed  GROUP A STREP BY PCR   ___________________   Procedures   ____________________________________________   INITIAL IMPRESSION / ASSESSMENT AND PLAN / ED COURSE  As part of my medical decision making, I reviewed the following data within the electronic MEDICAL RECORD NUMBER   58 year old male presented with above-stated history and physical exam consistent with oral pharyngeal candidiasis.  Patient given nystatin swish and swallow after he received viscous lidocaine swish and swallow.  Patient be prescribed nystatin swish and swallows for home. ____________________________________________  FINAL CLINICAL IMPRESSION(S) / ED DIAGNOSES  Final diagnoses:  Oral pharyngeal candidiasis     MEDICATIONS GIVEN DURING THIS VISIT:  Medications  lidocaine (XYLOCAINE) 2 % viscous mouth solution 15 mL (has no administration in time range)  nystatin (MYCOSTATIN) 100000 UNIT/ML suspension 500,000 Units (has no administration in time range)     ED Discharge Orders         Ordered    nystatin (MYCOSTATIN) 100000 UNIT/ML suspension  4 times daily     08/18/18 0612           Note:  This document was prepared using Dragon voice recognition software and may include unintentional dictation errors.   Gregor Hams, MD 08/18/18 [phone]

## 2018-08-18 NOTE — ED Triage Notes (Signed)
Pt brought in via ems from [facility]. Pt has a sore throat.  Sx for 2 days.  Pt alert.

## 2018-08-20 ENCOUNTER — Other Ambulatory Visit: Payer: Self-pay | Admitting: Internal Medicine

## 2018-08-20 ENCOUNTER — Ambulatory Visit
Admission: RE | Admit: 2018-08-20 | Discharge: 2018-08-20 | Disposition: A | Discharge status: Home / Self Care | Payer: Medicare Other | Source: Ambulatory Visit | Attending: Internal Medicine | Admitting: Internal Medicine

## 2018-08-20 DIAGNOSIS — G44319 Acute post-traumatic headache, not intractable: Secondary | ICD-10-CM | POA: Diagnosis present

## 2018-09-06 NOTE — Progress Notes (Deleted)
Patient ID: Danny Cook., male    DOB: 04-06-1961, 58 y.o.   MRN: [id number]  HPI  Danny Cook is a 58 y/o male with a history of DM, hyperlipidemia, HTN, CKD, HIV, schizophrenia, obstructive sleep apnea, anxiety, osteoarthritis and chronic heart failure.   Echo report from 06/08/18 reviewed and showed an EF of 55-60%. Echo report from 03/25/18 reviewed and showed an EF of 55-60%. Echo report from 07/31/17 reviewed and showed an EF of 55-65%. Echo report from 05/11/17 reviewed and showed an EF of 55% along with mild AR/ TR/ Danny.  Was in the ED 08/18/2018 due to a sore throat that was consistent with thrush. Treated and released. Admitted 06/07/18 due to weakness and dizziness. Cardiology consult obtained. Elevated troponin thought to be due to elevated creatinine. EKG was normal. IV fluids given. Discharged the next day.  Admitted 04/25/18 due to pneumonia. IV steroids and antibiotics were given. Cardiology, nephrology, rheumatology and ID consults obtained. Discharged after 8 days. Admitted 11/04/17 due to suicidal thoughts and schizophrenia. Was evaluated by psychiatry and discharged home after 5 days. [name]   He presents today for a follow-up visit with a chief complaint of    Past Medical History:  Diagnosis Date  . Anemia   . Anxiety   . Arthritis   . CHF (congestive heart failure) ([place])   . Chronic kidney disease    Renal Insufficiency Syndrome; Glomerulosclerosis 2013  . Complication of anesthesia   . Depressed   . Diabetes mellitus without complication ([place])   . High cholesterol   . HIV (human immunodeficiency virus infection) ([place])   . Hypertension   . 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 Paranoid disorder ([place])   . Schizophrenia, paranoid ([place])   . Sleep apnea    Past Surgical History:  Procedure Laterality Date  . COLONOSCOPY WITH PROPOFOL N/A 09/03/2015   Procedure: COLONOSCOPY WITH PROPOFOL;  Surgeon: Lollie Sails, MD;  Location: [facility] ENDOSCOPY;  Service: Endoscopy;   Laterality: N/A;  . DG TEETH FULL    . ESOPHAGOGASTRODUODENOSCOPY (EGD) WITH PROPOFOL N/A 07/01/2016   Procedure: ESOPHAGOGASTRODUODENOSCOPY (EGD) WITH PROPOFOL;  Surgeon: Lollie Sails, MD;  Location: [facility] ENDOSCOPY;  Service: Endoscopy;  Laterality: N/A;   Family History  Problem Relation Age of Onset  . Heart attack Mother   . Diabetes Mellitus II Mother   . Mental illness Mother   . CAD Mother   . Heart attack Father   . CAD Father   . Hypertension Father    Social History   Tobacco Use  . Smoking status: Never Smoker  . Smokeless tobacco: Never Used  Substance Use Topics  . Alcohol use: No   Allergies  Allergen Reactions  . Trazodone And Nefazodone   . Viread [Tenofovir Disoproxil]     Damages kidneys  . Etodolac Rash     Review of Systems  Constitutional: Positive for fatigue (improving). Negative for appetite change.  HENT: Negative for congestion, postnasal drip and sore throat.   Eyes: Negative.   Respiratory: Positive for shortness of breath (rarely). Negative for cough and chest tightness.   Cardiovascular: Positive for leg swelling. Negative for chest pain and palpitations.  Gastrointestinal: Negative for abdominal distention and abdominal pain.  Endocrine: Negative.   Genitourinary: Negative.   Musculoskeletal: Positive for back pain (when standing for long periods of time). Negative for neck pain.  Skin: Negative.   Allergic/Immunologic: Negative.   Neurological: Negative for dizziness and light-headedness.  Hematological: Negative for  adenopathy. Does not bruise/bleed easily.  Psychiatric/Behavioral: Positive for dysphoric mood. Negative for sleep disturbance. The patient is nervous/anxious.     Physical Exam  Constitutional: He is oriented to person, place, and time. He appears well-developed and well-nourished.  HENT:  Head: Normocephalic and atraumatic.  Neck: Normal range of motion. Neck supple. No JVD present.  Cardiovascular: Normal rate  and regular rhythm.  Pulmonary/Chest: Effort normal. He has no wheezes. He has no rales.  Abdominal: Soft. He exhibits no distension. There is no abdominal tenderness.  Musculoskeletal:        General: Edema (2+ pitting left lower leg/ 1+ pitting right lower leg) present. No tenderness.  Neurological: He is alert and oriented to person, place, and time.  Skin: Skin is warm and dry.  Psychiatric: He has a normal mood and affect. His behavior is normal. Thought content normal.  Nursing note and vitals reviewed.  Assessment & Plan:  1: Chronic heart failure with preserved ejection fraction- - NYHA class II - minimally fluid overloaded today with pedal edema - was started on every other day bumex not long ago - weighing daily; reminded to call for an overnight weight gain of >2 pounds or a weekly weight gain of >5 pounds - weight  - not adding salt to his food and doesn't think the food at the family care home tastes salty - saw cardiology Danny Cook) 04/05/18 - BNP 03/29/18 was 235.0 - PharmD reconciled medications with the patient - patient reports receiving his flu vaccine for this season  2: HTN- - BP  - BMP from 08/03/2018 reviewed and showed sodium 142, potassium 4.3, creatinine 1.09 and GFR >60 - saw PCP (Danny Cook) 05/10/18  3: Diabetes- - nonfasting glucose this morning was  - A1c 07/14/2018 was 7.6% - saw endocrinologist Danny Cook) 10/21/17 - saw nephrologist (Danny Cook) 05/10/18  4: Lymphedema- - stage 2 - does elevate his legs at times and he was encouraged to elevate his legs when sitting for long periods of time - order written for him to get compression socks and wear them daily with removal at bedtime - limited in his ability to exercise due to now living in a family care home - consider lymphapress compression boots if edema persists  Facility medication list was reviewed.

## 2018-09-07 NOTE — Progress Notes (Signed)
Patient ID: Danny Fairy., male    DOB: Mar 27, 1961, 58 y.o.   MRN: [id number]  HPI  Danny Cook is a 58 y/o male with a history of DM, hyperlipidemia, HTN, CKD, HIV, schizophrenia, obstructive sleep apnea, anxiety, osteoarthritis and chronic heart failure.   Echo report from 06/08/18 reviewed and showed an EF of 55-60%. Echo report from 03/25/18 reviewed and showed an EF of 55-60%. Echo report from 07/31/17 reviewed and showed an EF of 55-65%. Echo report from 05/11/17 reviewed and showed an EF of 55% along with mild AR/ TR/ Danny.  Was in the ED 08/18/2018 due to a sore throat that was consistent with thrush. Treated and released. Admitted 06/07/18 due to weakness and dizziness. Cardiology consult obtained. Elevated troponin thought to be due to elevated creatinine. EKG was normal. IV fluids given. Discharged the next day.  Admitted 04/25/18 due to pneumonia. IV steroids and antibiotics were given. Cardiology, nephrology, rheumatology and ID consults obtained. Discharged after 8 days. Admitted 11/04/17 due to suicidal thoughts and schizophrenia. Was evaluated by psychiatry and discharged home after 5 days. [name]   He presents today for a follow-up visit with a chief complaint of minimal fatigue upon moderate exertion. He describes this as chronic in nature having been present for several years. He has associated shortness of breath, dizziness, sore throat, anxiety and gradual weight gain along with this. He denies any difficulty sleeping, abdominal distention, palpitations, pedal edema, chest pain or cough. Hasn't been weighing every day.    Past Medical History:  Diagnosis Date  . Anemia   . Anxiety   . Arthritis   . CHF (congestive heart failure) ([place])   . Chronic kidney disease    Renal Insufficiency Syndrome; Glomerulosclerosis 2013  . Complication of anesthesia   . Depressed   . Diabetes mellitus without complication ([place])   . High cholesterol   . HIV (human immunodeficiency virus  infection) ([place])   . Hypertension   . 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 Paranoid disorder ([place])   . Schizophrenia, paranoid ([place])   . Sleep apnea    Past Surgical History:  Procedure Laterality Date  . COLONOSCOPY WITH PROPOFOL N/A 09/03/2015   Procedure: COLONOSCOPY WITH PROPOFOL;  Surgeon: Lollie Sails, MD;  Location: [facility] ENDOSCOPY;  Service: Endoscopy;  Laterality: N/A;  . DG TEETH FULL    . ESOPHAGOGASTRODUODENOSCOPY (EGD) WITH PROPOFOL N/A 07/01/2016   Procedure: ESOPHAGOGASTRODUODENOSCOPY (EGD) WITH PROPOFOL;  Surgeon: Lollie Sails, MD;  Location: [facility] ENDOSCOPY;  Service: Endoscopy;  Laterality: N/A;   Family History  Problem Relation Age of Onset  . Heart attack Mother   . Diabetes Mellitus II Mother   . Mental illness Mother   . CAD Mother   . Heart attack Father   . CAD Father   . Hypertension Father    Social History   Tobacco Use  . Smoking status: Never Smoker  . Smokeless tobacco: Never Used  Substance Use Topics  . Alcohol use: No   Allergies  Allergen Reactions  . Trazodone And Nefazodone   . Viread [Tenofovir Disoproxil]     Damages kidneys  . Etodolac Rash   Prior to Admission medications   Medication Sig Start Date End Date Taking? Authorizing Provider  abacavir-dolutegravir-lamiVUDine (TRIUMEQ) 600-50-300 MG tablet Take 1 tablet by mouth daily. 08/10/18  Yes Tsosie Billing, MD  acetaminophen (TYLENOL) 325 MG tablet Take 2 tablets (650 mg total) by mouth every 6 (six) hours as needed for fever (Fever >  101). 05/03/18  Yes Vaughan Basta, MD  albuterol (PROVENTIL HFA;VENTOLIN HFA) 108 (90 Base) MCG/ACT inhaler Inhale 2 puffs into the lungs every 6 (six) hours as needed for wheezing or shortness of breath. 04/14/18  Yes Loletha Grayer, MD  aspirin EC 325 MG tablet Take 325 mg by mouth daily.   Yes [provider]  bumetanide (BUMEX) 1 MG tablet Take 1 mg by mouth every other day.   Yes [provider]   butalbital-aspirin-caffeine Acquanetta Chain) 50-325-40 MG capsule Take 1-2 capsules by mouth every 6 (six) hours as needed for headache.   Yes [provider]  cyanocobalamin 1000 MCG tablet Take 1,000 mcg by mouth daily.   Yes [provider]  diazepam (VALIUM) 2 MG tablet Take 1 tablet (2 mg total) by mouth every 8 (eight) hours as needed for anxiety (use for breakthrough anxiety). 05/03/18  Yes Vaughan Basta, MD  diphenhydrAMINE (BENADRYL) 50 MG capsule Take 1 capsule (50 mg total) by mouth at bedtime as needed for sleep. 11/09/17  Yes McNew, Tyson Babinski, MD  divalproex (DEPAKOTE ER) 250 MG 24 hr tablet Take 500 mg by mouth 2 (two) times daily.    Yes [provider]  FANAPT 12 MG TABS Take 12 mg by mouth 2 (two) times daily.  11/14/16  Yes [provider]  feeding supplement, GLUCERNA SHAKE, (GLUCERNA SHAKE) LIQD Take 237 mLs by mouth daily.   Yes [provider]  ferrous sulfate 325 (65 FE) MG tablet Take 325 mg by mouth daily with breakfast.   Yes [provider]  fluticasone (FLONASE) 50 MCG/ACT nasal spray Place 2 sprays into both nostrils daily as needed for allergies.   Yes [provider]  fluticasone (FLOVENT HFA) 220 MCG/ACT inhaler Inhale 2 puffs into the lungs 2 (two) times daily. Rinse out mouth afterwards 04/14/18  Yes Wieting, Richard, MD  gabapentin (NEURONTIN) 100 MG capsule Take 1 capsule (100 mg total) by mouth 3 (three) times daily. Patient taking differently: Take 300 mg by mouth 3 (three) times daily.  05/03/18  Yes Vaughan Basta, MD  glimepiride (AMARYL) 4 MG tablet Take 1 tablet by mouth daily with breakfast. 05/14/18 05/14/19 Yes [provider]  haloperidol (HALDOL) 2 MG tablet Take 2 mg by mouth 3 (three) times daily.   Yes [provider]  Icosapent Ethyl (VASCEPA) 1 g CAPS Take 2 g by mouth 2 (two) times daily.   Yes [provider]  Insulin Degludec (TRESIBA FLEXTOUCH) 200  UNIT/ML SOPN Inject 86 Units into the skin See admin instructions.    Yes [provider]  liraglutide (VICTOZA) 18 MG/3ML SOPN Inject 1.8 mg into the skin See admin instructions.    Yes [provider]  losartan (COZAAR) 25 MG tablet Take 1 tablet (25 mg total) by mouth daily. 05/04/18  Yes Vaughan Basta, MD  metFORMIN (GLUCOPHAGE) 1000 MG tablet Take 1 tablet by mouth 2 (two) times daily. 05/14/18 05/14/19 Yes [provider]  metoprolol tartrate (LOPRESSOR) 25 MG tablet Take 0.5 tablets (12.5 mg total) by mouth 2 (two) times daily. 05/03/18  Yes Vaughan Basta, MD  mirtazapine (REMERON) 30 MG tablet Take 30 mg by mouth at bedtime. 04/21/18  Yes [provider]  nitroGLYCERIN (NITROSTAT) 0.4 MG SL tablet Place 0.4 mg under the tongue every 5 (five) minutes as needed for chest pain.    Yes [provider]  nortriptyline (PAMELOR) 10 MG capsule Take 2 capsules by mouth at bedtime. 05/24/18  Yes [provider]  omeprazole (PRILOSEC) 40 MG capsule Take 40 mg by mouth 2 (two) times daily.   Yes [provider]  promethazine (PHENERGAN) 6.25 MG/5ML syrup Take 6.25 mg by mouth every 6 (six) hours as needed for nausea or vomiting.   Yes [provider]  rosuvastatin (CRESTOR) 20 MG tablet Take 40 mg by mouth daily.    Yes [provider]  sucralfate (CARAFATE) 1 g tablet Take 1 g by mouth 4 (four) times daily.  04/21/18  Yes [provider]  triamcinolone cream (KENALOG) 0.1 % Apply 1 application topically 2 (two) times daily as needed. Patient taking differently: Apply 1 application topically 2 (two) times daily as needed (for rash).  11/16/16  Yes Little, Traci M, PA-C  venlafaxine XR (EFFEXOR-XR) 75 MG 24 hr capsule Take 1 capsule (75 mg total) by mouth daily with breakfast. 11/09/17  Yes McNew, Tyson Babinski, MD  benzonatate (TESSALON PERLES) 100 MG capsule Take 1 capsule (100 mg total) by mouth every 6  (six) hours as needed for cough. Patient not taking: Reported on 08/10/2018 04/24/18 04/24/19  Schuyler Amor, MD  butalbital-acetaminophen-caffeine (FIORICET, ESGIC) [phone] MG tablet Take 1-2 tablets by mouth every 6 (six) hours as needed. Patient not taking: Reported on 09/08/2018 01/31/18 01/31/19  Earleen Newport, MD  guaiFENesin (MUCINEX) 600 MG 12 hr tablet Take 1 tablet (600 mg total) by mouth 2 (two) times daily. Patient not taking: Reported on 09/08/2018 08/10/18   Tsosie Billing, MD  guaiFENesin-codeine [facility]) 100-10 MG/5ML syrup Take 5 mLs by mouth every 6 (six) hours as needed for cough.    [provider]  lidocaine (XYLOCAINE) 2 % solution Use as directed 15 mLs in the mouth or throat every 6 (six) hours as needed for mouth pain. Patient not taking: Reported on 09/08/2018 08/18/18   Gregor Hams, MD    Review of Systems  Constitutional: Positive for fatigue. Negative for appetite change.  HENT: Positive for sore throat. Negative for congestion and postnasal drip.   Eyes: Negative.   Respiratory: Positive for shortness of breath (rarely). Negative for cough and chest tightness.   Cardiovascular: Negative for chest pain, palpitations and leg swelling.  Gastrointestinal: Negative for abdominal distention and abdominal pain.  Endocrine: Negative.   Genitourinary: Negative.   Musculoskeletal: Positive for back pain (when standing for long periods of time). Negative for neck pain.  Skin: Negative.   Allergic/Immunologic: Negative.   Neurological: Positive for dizziness and light-headedness.  Hematological: Negative for adenopathy. Does not bruise/bleed easily.  Psychiatric/Behavioral: Positive for dysphoric mood. Negative for sleep disturbance. The patient is nervous/anxious.    Vitals:   09/08/18 1100  BP: 113/64  Pulse: 68  Resp: 18  SpO2: 96%  Weight: 224 lb 2 oz (101.7 kg)  Height: 5\' 7"  (1.702 m)   Wt Readings from Last 3 Encounters:   09/08/18 224 lb 2 oz (101.7 kg)  08/18/18 228 lb (103.4 kg)  08/10/18 228 lb 8 oz (103.6 kg)   Lab Results  Component Value Date   CREATININE 1.09 08/03/2018   CREATININE 1.43 (H) 06/08/2018   CREATININE 1.77 (H) 06/07/2018    Physical Exam  Constitutional: He is oriented to person, place, and time. He appears well-developed and well-nourished.  HENT:  Head: Normocephalic and atraumatic.  Neck: Normal range of motion. Neck supple. No JVD present.  Cardiovascular: Normal rate and regular rhythm.  Pulmonary/Chest: Effort normal. He has no wheezes. He has no rales.  Abdominal: Soft. He  exhibits no distension. There is no abdominal tenderness.  Musculoskeletal:        General: No tenderness or edema.  Neurological: He is alert and oriented to person, place, and time.  Skin: Skin is warm and dry.  Psychiatric: He has a normal mood and affect. His behavior is normal. Thought content normal.  Nursing note and vitals reviewed.  Assessment & Plan:  1: Chronic heart failure with preserved ejection fraction- - NYHA class II - euvolemic today - not weighing daily and he was encouraged to resume; reminded to call for an overnight weight gain of >2 pounds or a weekly weight gain of >5 pounds - weight up 11 pounds from last visit here 4 months ago - not adding salt to his food and doesn't think the food at the [facility] family care home tastes salty - saw cardiology ([place]) 06/15/18 - BNP 03/29/18 was 235.0 - PharmD reconciled medications with the patient - patient reports receiving his flu vaccine for this season  2: HTN- - BP looks good although on the low side today - BMP from 08/03/2018 reviewed and showed sodium 142, potassium 4.3, creatinine 1.09 and GFR >60 - saw PCP (Hand[place]) 08/20/2018  3: Diabetes- - A1c 07/14/2018 was 7.6% - saw endocrinologist Jenetta DownerDina Rich) 10/21/17 - saw nephrologist ([place]) 05/10/18  4: Lymphedema- - stage 2 - now wearing support socks -  resolved  Facility medication list was reviewed.  Return in 6 months or sooner for any questions/problems before then.

## 2018-09-08 ENCOUNTER — Ambulatory Visit: Payer: Medicare Other | Attending: Family | Admitting: Family

## 2018-09-08 ENCOUNTER — Ambulatory Visit: Payer: Self-pay | Admitting: Family

## 2018-09-08 ENCOUNTER — Encounter: Payer: Self-pay | Admitting: Family

## 2018-09-08 ENCOUNTER — Encounter: Payer: Self-pay | Admitting: Pharmacist

## 2018-09-08 ENCOUNTER — Other Ambulatory Visit: Payer: Self-pay

## 2018-09-08 VITALS — BP 113/64 | HR 68 | Resp 18 | Ht 67.0 in | Wt 224.1 lb

## 2018-09-08 DIAGNOSIS — Z794 Long term (current) use of insulin: Secondary | ICD-10-CM | POA: Diagnosis not present

## 2018-09-08 DIAGNOSIS — I130 Hypertensive heart and chronic kidney disease with heart failure and stage 1 through stage 4 chronic kidney disease, or unspecified chronic kidney disease: Secondary | ICD-10-CM | POA: Insufficient documentation

## 2018-09-08 DIAGNOSIS — M1990 Unspecified osteoarthritis, unspecified site: Secondary | ICD-10-CM | POA: Insufficient documentation

## 2018-09-08 DIAGNOSIS — R0602 Shortness of breath: Secondary | ICD-10-CM | POA: Diagnosis not present

## 2018-09-08 DIAGNOSIS — E785 Hyperlipidemia, unspecified: Secondary | ICD-10-CM | POA: Insufficient documentation

## 2018-09-08 DIAGNOSIS — E1122 Type 2 diabetes mellitus with diabetic chronic kidney disease: Secondary | ICD-10-CM | POA: Insufficient documentation

## 2018-09-08 DIAGNOSIS — I890 Lymphedema, not elsewhere classified: Secondary | ICD-10-CM | POA: Diagnosis not present

## 2018-09-08 DIAGNOSIS — E7800 Pure hypercholesterolemia, unspecified: Secondary | ICD-10-CM | POA: Insufficient documentation

## 2018-09-08 DIAGNOSIS — Z833 Family history of diabetes mellitus: Secondary | ICD-10-CM | POA: Insufficient documentation

## 2018-09-08 DIAGNOSIS — I5032 Chronic diastolic (congestive) heart failure: Secondary | ICD-10-CM | POA: Diagnosis present

## 2018-09-08 DIAGNOSIS — F329 Major depressive disorder, single episode, unspecified: Secondary | ICD-10-CM | POA: Diagnosis not present

## 2018-09-08 DIAGNOSIS — G4733 Obstructive sleep apnea (adult) (pediatric): Secondary | ICD-10-CM | POA: Insufficient documentation

## 2018-09-08 DIAGNOSIS — F200 Paranoid schizophrenia: Secondary | ICD-10-CM | POA: Insufficient documentation

## 2018-09-08 DIAGNOSIS — Z79899 Other long term (current) drug therapy: Secondary | ICD-10-CM | POA: Insufficient documentation

## 2018-09-08 DIAGNOSIS — Z7901 Long term (current) use of anticoagulants: Secondary | ICD-10-CM | POA: Diagnosis not present

## 2018-09-08 DIAGNOSIS — R42 Dizziness and giddiness: Secondary | ICD-10-CM | POA: Diagnosis not present

## 2018-09-08 DIAGNOSIS — Z7982 Long term (current) use of aspirin: Secondary | ICD-10-CM | POA: Diagnosis not present

## 2018-09-08 DIAGNOSIS — Z8249 Family history of ischemic heart disease and other diseases of the circulatory system: Secondary | ICD-10-CM | POA: Diagnosis not present

## 2018-09-08 DIAGNOSIS — B20 Human immunodeficiency virus [HIV] disease: Secondary | ICD-10-CM | POA: Insufficient documentation

## 2018-09-08 DIAGNOSIS — I10 Essential (primary) hypertension: Secondary | ICD-10-CM

## 2018-09-08 DIAGNOSIS — N189 Chronic kidney disease, unspecified: Secondary | ICD-10-CM | POA: Insufficient documentation

## 2018-09-08 DIAGNOSIS — J029 Acute pharyngitis, unspecified: Secondary | ICD-10-CM | POA: Diagnosis not present

## 2018-09-08 DIAGNOSIS — N183 Chronic kidney disease, stage 3 (moderate): Secondary | ICD-10-CM

## 2018-09-08 NOTE — Progress Notes (Signed)
[facility] - PHARMACIST COUNSELING NOTE  ADHERENCE ASSESSMENT  Adherence strategy: facility administers medications   Do you ever forget to take your medication? [] Yes (1) [x] No (0)  Do you ever skip doses due to side effects? [] Yes (1) [x] No (0)  Do you have trouble affording your medicines? [] Yes (1) [x] No (0)  Are you ever unable to pick up your medication due to transportation difficulties? [] Yes (1) [x] No (0)  Do you ever stop taking your medications because you don't believe they are helping? [] Yes (1) [x] No (0)  Total score _0______    Recommendations given to patient about increasing adherence: None  Guideline-Directed Medical Therapy/Evidence Based Medicine    ACE/ARB/ARNI: losartan 25 mg daily   Beta Blocker: metoprolol tartrate 12.5 mg twice daily (HFpEF)   Aldosterone Antagonist: none Diuretic: bumetanide 1 mg every other day    SUBJECTIVE   HPI: Here for follow up visit. Would like to have his throat looked at because he thinks he has thrush again.  Past Medical History:  Diagnosis Date  . Anemia   . Anxiety   . Arthritis   . CHF (congestive heart failure) ([place])   . Chronic kidney disease    Renal Insufficiency Syndrome; Glomerulosclerosis 2013  . Complication of anesthesia   . Depressed   . Diabetes mellitus without complication ([place])   . High cholesterol   . HIV (human immunodeficiency virus infection) ([place])   . Hypertension   . 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 Paranoid disorder ([place])   . Schizophrenia, paranoid ([place])   . Sleep apnea         OBJECTIVE    Vital signs: HR 68, BP 113/64, weight (pounds) 224  ECHO: Date 06/08/18, EF 55-60%   BMP Latest Ref Rng & Units 08/03/2018 06/08/2018 06/07/2018  Glucose 70 - 99 mg/dL 194(H) 81 140(H)  BUN 6 - 20 mg/dL 14 19 22(H)  Creatinine 0.61 - 1.24 mg/dL 1.09 1.43(H) 1.77(H)  Sodium 135 - 145 mmol/L 142 140 136  Potassium 3.5 - 5.1 mmol/L 4.3 3.5 4.2  Chloride  98 - 111 mmol/L 104 103 100  CO2 22 - 32 mmol/L 27 28 23   Calcium 8.9 - 10.3 mg/dL 9.0 8.1(L) 8.7(L)    ASSESSMENT 58 year old male with HFpEF. No concerns today except that he thinks he has oral thrush again and would like for someone to look at it. Patient questioned about use of Flovent and he does not rinse his mouth after use because "he never tastes anything." BP today well-controlled.   PLAN Continue current medication regimen. Patient counseled to rinse his mouth and spit after each use of Flovent to prevent oral thrush. He was encouraged to also follow up with his PCP or a provider at the facility if one is available so that he can have the thrush further evaluated.    Time spent: 10 minutes  [place], Pharm.D. 09/08/2018 11:49 AM    Current Outpatient Medications:  .  abacavir-dolutegravir-lamiVUDine (TRIUMEQ) 600-50-300 MG tablet, Take 1 tablet by mouth daily., Disp: 30 tablet, Rfl: 3 .  acetaminophen (TYLENOL) 325 MG tablet, Take 2 tablets (650 mg total) by mouth every 6 (six) hours as needed for fever (Fever >101)., Disp: 10 tablet, Rfl: 0 .  albuterol (PROVENTIL HFA;VENTOLIN HFA) 108 (90 Base) MCG/ACT inhaler, Inhale 2 puffs into the lungs every 6 (six) hours as needed for wheezing or shortness of breath., Disp: 1 Inhaler, Rfl: 2 .  aspirin  EC 325 MG tablet, Take 325 mg by mouth daily., Disp: , Rfl:  .  benzonatate (TESSALON PERLES) 100 MG capsule, Take 1 capsule (100 mg total) by mouth every 6 (six) hours as needed for cough. (Patient not taking: Reported on 08/10/2018), Disp: 30 capsule, Rfl: 0 .  bumetanide (BUMEX) 1 MG tablet, Take 1 mg by mouth every other day., Disp: , Rfl:  .  butalbital-acetaminophen-caffeine (FIORICET, ESGIC) 50-325-40 MG tablet, Take 1-2 tablets by mouth every 6 (six) hours as needed. (Patient not taking: Reported on 09/08/2018), Disp: 20 tablet, Rfl: 0 .  butalbital-aspirin-caffeine (FIORINAL) 50-325-40 MG capsule, Take 1-2 capsules by mouth  every 6 (six) hours as needed for headache., Disp: , Rfl:  .  cyanocobalamin 1000 MCG tablet, Take 1,000 mcg by mouth daily., Disp: , Rfl:  .  diazepam (VALIUM) 2 MG tablet, Take 1 tablet (2 mg total) by mouth every 8 (eight) hours as needed for anxiety (use for breakthrough anxiety)., Disp: 30 tablet, Rfl: 0 .  diphenhydrAMINE (BENADRYL) 50 MG capsule, Take 1 capsule (50 mg total) by mouth at bedtime as needed for sleep., Disp: 30 capsule, Rfl: 0 .  divalproex (DEPAKOTE ER) 250 MG 24 hr tablet, Take 500 mg by mouth 2 (two) times daily. , Disp: , Rfl:  .  FANAPT 12 MG TABS, Take 12 mg by mouth 2 (two) times daily. , Disp: , Rfl:  .  feeding supplement, GLUCERNA SHAKE, (GLUCERNA SHAKE) LIQD, Take 237 mLs by mouth daily., Disp: , Rfl:  .  ferrous sulfate 325 (65 FE) MG tablet, Take 325 mg by mouth daily with breakfast., Disp: , Rfl:  .  fluticasone (FLONASE) 50 MCG/ACT nasal spray, Place 2 sprays into both nostrils daily as needed for allergies., Disp: , Rfl:  .  fluticasone (FLOVENT HFA) 220 MCG/ACT inhaler, Inhale 2 puffs into the lungs 2 (two) times daily. Rinse out mouth afterwards, Disp: 1 Inhaler, Rfl: 12 .  gabapentin (NEURONTIN) 100 MG capsule, Take 1 capsule (100 mg total) by mouth 3 (three) times daily. (Patient taking differently: Take 300 mg by mouth 3 (three) times daily. ), Disp: 30 capsule, Rfl: 0 .  glimepiride (AMARYL) 4 MG tablet, Take 1 tablet by mouth daily with breakfast., Disp: , Rfl:  .  guaiFENesin (MUCINEX) 600 MG 12 hr tablet, Take 1 tablet (600 mg total) by mouth 2 (two) times daily. (Patient not taking: Reported on 09/08/2018), Disp: 20 tablet, Rfl: 0 .  guaiFENesin-codeine (ROBITUSSIN AC) 100-10 MG/5ML syrup, Take 5 mLs by mouth every 6 (six) hours as needed for cough., Disp: , Rfl:  .  haloperidol (HALDOL) 2 MG tablet, Take 2 mg by mouth 3 (three) times daily., Disp: , Rfl:  .  Icosapent Ethyl (VASCEPA) 1 g CAPS, Take 2 g by mouth 2 (two) times daily., Disp: , Rfl:  .   Insulin Degludec (TRESIBA FLEXTOUCH) 200 UNIT/ML SOPN, Inject 86 Units into the skin See admin instructions. , Disp: , Rfl:  .  lidocaine (XYLOCAINE) 2 % solution, Use as directed 15 mLs in the mouth or throat every 6 (six) hours as needed for mouth pain. (Patient not taking: Reported on 09/08/2018), Disp: 200 mL, Rfl: 0 .  liraglutide (VICTOZA) 18 MG/3ML SOPN, Inject 1.8 mg into the skin See admin instructions. , Disp: , Rfl:  .  losartan (COZAAR) 25 MG tablet, Take 1 tablet (25 mg total) by mouth daily., Disp: 30 tablet, Rfl: 0 .  metFORMIN (GLUCOPHAGE) 1000 MG tablet, Take 1 tablet by mouth  2 (two) times daily., Disp: , Rfl:  .  metoprolol tartrate (LOPRESSOR) 25 MG tablet, Take 0.5 tablets (12.5 mg total) by mouth 2 (two) times daily., Disp: 30 tablet, Rfl: 0 .  mirtazapine (REMERON) 30 MG tablet, Take 30 mg by mouth at bedtime., Disp: , Rfl:  .  nitroGLYCERIN (NITROSTAT) 0.4 MG SL tablet, Place 0.4 mg under the tongue every 5 (five) minutes as needed for chest pain. , Disp: , Rfl:  .  nortriptyline (PAMELOR) 10 MG capsule, Take 2 capsules by mouth at bedtime., Disp: , Rfl: 3 .  omeprazole (PRILOSEC) 40 MG capsule, Take 40 mg by mouth 2 (two) times daily., Disp: , Rfl:  .  promethazine (PHENERGAN) 6.25 MG/5ML syrup, Take 6.25 mg by mouth every 6 (six) hours as needed for nausea or vomiting., Disp: , Rfl:  .  rosuvastatin (CRESTOR) 20 MG tablet, Take 40 mg by mouth daily. , Disp: , Rfl:  .  sucralfate (CARAFATE) 1 g tablet, Take 1 g by mouth 4 (four) times daily. , Disp: , Rfl:  .  triamcinolone cream (KENALOG) 0.1 %, Apply 1 application topically 2 (two) times daily as needed. (Patient taking differently: Apply 1 application topically 2 (two) times daily as needed (for rash). ), Disp: 30 g, Rfl: 0 .  venlafaxine XR (EFFEXOR-XR) 75 MG 24 hr capsule, Take 1 capsule (75 mg total) by mouth daily with breakfast., Disp: 30 capsule, Rfl: 0   COUNSELING POINTS/CLINICAL PEARLS  Losartan (Goal: 150 mg  once daily)  Warn male patient to avoid pregnancy and to report a pregnancy that occurs during therapy.  Side effects may include dizziness, upper respiratory infection, nasal congestion, and back pain.  Warn patient to avoid use of potassium supplements or potassium-containing salt substitutes unless they consult healthcare provider.   DRUGS TO AVOID IN HEART FAILURE  Drug or Class Mechanism  Analgesics . NSAIDs . COX-2 inhibitors . Glucocorticoids  Sodium and water retention, increased systemic vascular resistance, decreased response to diuretics   Diabetes Medications . Metformin . Thiazolidinediones o Rosiglitazone (Avandia) o Pioglitazone (Actos) . DPP4 Inhibitors o Saxagliptin (Onglyza) o Sitagliptin (Januvia)   Lactic acidosis Possible calcium channel blockade   Unknown  Antiarrhythmics . Class I  o Flecainide o Disopyramide . Class III o Sotalol . Other o Dronedarone  Negative inotrope, proarrhythmic   Proarrhythmic, beta blockade  Negative inotrope  Antihypertensives . Alpha Blockers o Doxazosin . Calcium Channel Blockers o Diltiazem o Verapamil o Nifedipine . Central Alpha Adrenergics o Moxonidine . Peripheral Vasodilators o Minoxidil  Increases renin and aldosterone  Negative inotrope    Possible sympathetic withdrawal  Unknown  Anti-infective . Itraconazole . Amphotericin B  Negative inotrope Unknown  Hematologic . Anagrelide . Cilostazol   Possible inhibition of PD IV Inhibition of PD III causing arrhythmias  Neurologic/Psychiatric . Stimulants . Anti-Seizure Drugs o Carbamazepine o Pregabalin . Antidepressants o Tricyclics o Citalopram . Parkinsons o Bromocriptine o Pergolide o Pramipexole . Antipsychotics o Clozapine . Antimigraine o Ergotamine o Methysergide . Appetite suppressants . Bipolar o Lithium  Peripheral alpha and beta agonist activity  Negative inotrope and chronotrope Calcium channel  blockade  Negative inotrope, proarrhythmic Dose-dependent QT prolongation  Excessive serotonin activity/valvular damage Excessive serotonin activity/valvular damage Unknown  IgE mediated hypersensitivy, calcium channel blockade  Excessive serotonin activity/valvular damage Excessive serotonin activity/valvular damage Valvular damage  Direct myofibrillar degeneration, adrenergic stimulation  Antimalarials . Chloroquine . Hydroxychloroquine Intracellular inhibition of lysosomal enzymes  Urologic Agents . Alpha Blockers o Doxazosin o  Prazosin o Tamsulosin o Terazosin  Increased renin and aldosterone  Adapted from Page Carleene Overlie, et al. "Drugs That May Cause or Exacerbate Heart Failure: A Scientific Statement from the [place]." Circulation 2016; 619:J09-T26. DOI: 10.1161/CIR.0000000000000426   MEDICATION ADHERENCES TIPS AND STRATEGIES 1. Taking medication as prescribed improves patient outcomes in heart failure (reduces hospitalizations, improves symptoms, increases survival) 2. Side effects of medications can be managed by decreasing doses, switching agents, stopping drugs, or adding additional therapy. Please let someone in the [facility] know if you have having bothersome side effects so we can modify your regimen. Do not alter your medication regimen without talking to [place].  3. Medication reminders can help patients remember to take drugs on time. If you are missing or forgetting doses you can try linking behaviors, using pill boxes, or an electronic reminder like an alarm on your phone or an app. Some people can also get automated phone calls as medication reminders.

## 2018-09-08 NOTE — Patient Instructions (Signed)
Continue weighing daily and call for an overnight weight gain of > 2 pounds or a weekly weight gain of >5 pounds. 

## 2018-09-22 ENCOUNTER — Ambulatory Visit: Payer: Self-pay | Admitting: Family

## 2018-12-30 ENCOUNTER — Other Ambulatory Visit: Payer: Self-pay

## 2018-12-30 ENCOUNTER — Ambulatory Visit: Payer: Medicare Other | Attending: Infectious Diseases | Admitting: Infectious Diseases

## 2018-12-30 ENCOUNTER — Encounter: Payer: Self-pay | Admitting: Infectious Diseases

## 2018-12-30 ENCOUNTER — Other Ambulatory Visit
Admission: RE | Admit: 2018-12-30 | Discharge: 2018-12-30 | Disposition: A | Discharge status: Home / Self Care | Payer: Medicare Other | Source: Ambulatory Visit | Attending: Infectious Diseases | Admitting: Infectious Diseases

## 2018-12-30 VITALS — BP 112/74 | HR 78 | Temp 97.8°F | Ht 67.0 in

## 2018-12-30 DIAGNOSIS — Z79899 Other long term (current) drug therapy: Secondary | ICD-10-CM

## 2018-12-30 DIAGNOSIS — B20 Human immunodeficiency virus [HIV] disease: Secondary | ICD-10-CM | POA: Diagnosis not present

## 2018-12-30 DIAGNOSIS — I130 Hypertensive heart and chronic kidney disease with heart failure and stage 1 through stage 4 chronic kidney disease, or unspecified chronic kidney disease: Secondary | ICD-10-CM

## 2018-12-30 DIAGNOSIS — S20212A Contusion of left front wall of thorax, initial encounter: Secondary | ICD-10-CM

## 2018-12-30 DIAGNOSIS — F200 Paranoid schizophrenia: Secondary | ICD-10-CM

## 2018-12-30 DIAGNOSIS — Z8709 Personal history of other diseases of the respiratory system: Secondary | ICD-10-CM

## 2018-12-30 DIAGNOSIS — E1122 Type 2 diabetes mellitus with diabetic chronic kidney disease: Secondary | ICD-10-CM

## 2018-12-30 DIAGNOSIS — D649 Anemia, unspecified: Secondary | ICD-10-CM | POA: Diagnosis not present

## 2018-12-30 DIAGNOSIS — I509 Heart failure, unspecified: Secondary | ICD-10-CM

## 2018-12-30 DIAGNOSIS — X58XXXA Exposure to other specified factors, initial encounter: Secondary | ICD-10-CM

## 2018-12-30 DIAGNOSIS — Z885 Allergy status to narcotic agent status: Secondary | ICD-10-CM

## 2018-12-30 DIAGNOSIS — F419 Anxiety disorder, unspecified: Secondary | ICD-10-CM

## 2018-12-30 DIAGNOSIS — Z888 Allergy status to other drugs, medicaments and biological substances status: Secondary | ICD-10-CM

## 2018-12-30 DIAGNOSIS — E785 Hyperlipidemia, unspecified: Secondary | ICD-10-CM

## 2018-12-30 DIAGNOSIS — N189 Chronic kidney disease, unspecified: Secondary | ICD-10-CM

## 2018-12-30 DIAGNOSIS — Z7984 Long term (current) use of oral hypoglycemic drugs: Secondary | ICD-10-CM

## 2018-12-30 LAB — COMPREHENSIVE METABOLIC PANEL
ALT: 27 U/L (ref 0–44)
AST: 22 U/L (ref 15–41)
Albumin: 4.1 g/dL (ref 3.5–5.0)
Alkaline Phosphatase: 100 U/L (ref 38–126)
Anion gap: 13 (ref 5–15)
BUN: 21 mg/dL — ABNORMAL HIGH (ref 6–20)
CO2: 27 mmol/L (ref 22–32)
Calcium: 10.2 mg/dL (ref 8.9–10.3)
Chloride: 99 mmol/L (ref 98–111)
Creatinine, Ser: 1.24 mg/dL (ref 0.61–1.24)
GFR calc Af Amer: >60 mL/min (ref >60)
GFR calc non Af Amer: >60 mL/min (ref >60)
Glucose, Bld: 182 mg/dL — ABNORMAL HIGH (ref 70–99)
Potassium: 4.6 mmol/L (ref 3.5–5.1)
Sodium: 139 mmol/L (ref 135–145)
Total Bilirubin: 0.5 mg/dL (ref 0.3–1.2)
Total Protein: 7.7 g/dL (ref 6.5–8.1)

## 2018-12-30 LAB — SEDIMENTATION RATE: Sed Rate: 34 mm/hr — ABNORMAL HIGH (ref 0–20)

## 2018-12-30 NOTE — Addendum Note (Signed)
Addended by: Santiago Bur on: 12/30/2018 12:17 PM   Modules accepted: Orders

## 2018-12-30 NOTE — Patient Instructions (Addendum)
You have a bruise on your chest wall and it is improving- you dont have any other lesions- keep an eye and let your PCP know if you get more lesions. You need to have more physical activity- more walking- Physical therpy fo your knees could help Continue taking Triumeq- Will do your HIV labs today . Will see you in 6 months

## 2018-12-30 NOTE — Progress Notes (Signed)
NAME: Danny Cook.  DOB: October 13, 1960  MRN: [id number]  Date/Time: 12/30/2018 11:18 AM  REQUESTING PROVIDER Subjective:  Follow up visit- has a new problem ?Danny Cook. is a 58 y.o. with a history of HIV/DM/CHF/schizophrenia, anxiety Currently on Triumeq and is 100% adherent. Last viral load is undetectable and CD4 is 828 from 08/03/2018.  His creatinine is 1.09. He has no side effects from his medication. He is here for a lesion he had on his chest for the past 3 days No fever, no bites, no trauma No cough Has generalized aches , especially more in the knees   HIV diagnosed 41s [place] Cd4 <50 HAARt history Triumeq since 2016 Prior regimen- epzicom+ reyataz+norvir  Acquired thrusex with men Genotype-NK  Medical History CKD FSGS Hyperlipidemia Anemia- small M spike-  Pericardial effusion Left pleural effusion High ESR HTN Adrenal adenoma Inactive problem Kaposi sarcoma ? ?   Past Surgical History:  Procedure Laterality Date  . COLONOSCOPY WITH PROPOFOL N/A 09/03/2015   Procedure: COLONOSCOPY WITH PROPOFOL;  Surgeon: Lollie Sails, MD;  Location[facility] ENDOSCOPY;  Service: Endoscopy;  Laterality: N/A;  . DG TEETH FULL    . ESOPHAGOGASTRODUODENOSCOPY (EGD) WITH PROPOFOL N/A 07/01/2016   Procedure: ESOPHAGOGASTRODUODENOSCOPY (EGD) WITH PROPOFOL;  Surgeon: Lollie Sails, MD;  Location: [facility] ENDOSCOPY;  Service: Endoscopy;  Laterality: N/A;    Social History   Socioeconomic History  . Marital status: Single    Spouse name: Not on file  . Number of children: Not on file  . Years of education: 43  . Highest education level: GED or equivalent  Occupational History  . Not on file  Social Needs  . Financial resource strain: Not hard at all  . Food insecurity    Worry: Sometimes true    Inability: Never true  . Transportation needs    Medical: No    Non-medical: No  Tobacco Use  . Smoking status: Never Smoker  . Smokeless tobacco:  Never Used  Substance and Sexual Activity  . Alcohol use: No  . Drug use: No  . Sexual activity: Never  Lifestyle  . Physical activity    Days per week: 0 days    Minutes per session: 0 min  . Stress: To some extent  Relationships  . Social connections    Talks on phone: More than three times a week    Gets together: Three times a week    Attends religious service: Never    Active member of club or organization: No    Attends meetings of clubs or organizations: Never    Relationship status: Never married  . Intimate partner violence    Fear of current or ex partner: Patient refused    Emotionally abused: Patient refused    Physically abused: Patient refused    Forced sexual activity: Patient refused  Other Topics Concern  . Not on file  Social History Narrative  . Not on file    Family History  Problem Relation Age of Onset  . Heart attack Mother   . Diabetes Mellitus II Mother   . Mental illness Mother   . CAD Mother   . Heart attack Father   . CAD Father   . Hypertension Father    Allergies  Allergen Reactions  . Trazodone And Nefazodone   . Viread [Tenofovir Disoproxil]     Damages kidneys  . Etodolac Rash   ? Current Outpatient Medications  Medication Sig Dispense Refill  . abacavir-dolutegravir-lamiVUDine (TRIUMEQ)  600-50-300 MG tablet Take 1 tablet by mouth daily. 30 tablet 3  . acetaminophen (TYLENOL) 325 MG tablet Take 2 tablets (650 mg total) by mouth every 6 (six) hours as needed for fever (Fever >101). 10 tablet 0  . albuterol (PROVENTIL HFA;VENTOLIN HFA) 108 (90 Base) MCG/ACT inhaler Inhale 2 puffs into the lungs every 6 (six) hours as needed for wheezing or shortness of breath. 1 Inhaler 2  . aspirin EC 325 MG tablet Take 325 mg by mouth daily.    . bumetanide (BUMEX) 1 MG tablet Take 1 mg by mouth every other day.    . butalbital-acetaminophen-caffeine (FIORICET, ESGIC) 50-325-40 MG tablet Take 1-2 tablets by mouth every 6 (six) hours as needed. 20  tablet 0  . butalbital-aspirin-caffeine (FIORINAL) 50-325-40 MG capsule Take 1-2 capsules by mouth every 6 (six) hours as needed for headache.    . cyanocobalamin 1000 MCG tablet Take 1,000 mcg by mouth daily.    . diazepam (VALIUM) 2 MG tablet Take 1 tablet (2 mg total) by mouth every 8 (eight) hours as needed for anxiety (use for breakthrough anxiety). 30 tablet 0  . diphenhydrAMINE (BENADRYL) 50 MG capsule Take 1 capsule (50 mg total) by mouth at bedtime as needed for sleep. 30 capsule 0  . divalproex (DEPAKOTE ER) 250 MG 24 hr tablet Take 500 mg by mouth 2 (two) times daily.     [name] FANAPT 12 MG TABS Take 12 mg by mouth 2 (two) times daily.     . feeding supplement, GLUCERNA SHAKE, (GLUCERNA SHAKE) LIQD Take 237 mLs by mouth daily.    . ferrous sulfate 325 (65 FE) MG tablet Take 325 mg by mouth daily with breakfast.    . fluticasone (FLONASE) 50 MCG/ACT nasal spray Place 2 sprays into both nostrils daily as needed for allergies.    . fluticasone (FLOVENT HFA) 220 MCG/ACT inhaler Inhale 2 puffs into the lungs 2 (two) times daily. Rinse out mouth afterwards 1 Inhaler 12  . gabapentin (NEURONTIN) 100 MG capsule Take 1 capsule (100 mg total) by mouth 3 (three) times daily. (Patient taking differently: Take 300 mg by mouth 3 (three) times daily. ) 30 capsule 0  . gemfibrozil (LOPID) 600 MG tablet Take 600 mg by mouth 2 (two) times daily before a meal.    . glimepiride (AMARYL) 4 MG tablet Take 1 tablet by mouth daily with breakfast.    . guaiFENesin (MUCINEX) 600 MG 12 hr tablet Take 1 tablet (600 mg total) by mouth 2 (two) times daily. 20 tablet 0  . guaiFENesin-codeine (ROBITUSSIN AC) 100-10 MG/5ML syrup Take 5 mLs by mouth every 6 (six) hours as needed for cough.    . haloperidol (HALDOL) 2 MG tablet Take 2 mg by mouth 3 (three) times daily.    Vanessa Kick Ethyl (VASCEPA) 1 g CAPS Take 2 g by mouth 2 (two) times daily.    . Insulin Degludec (TRESIBA FLEXTOUCH) 200 UNIT/ML SOPN Inject 86 Units into  the skin See admin instructions.     . lidocaine (XYLOCAINE) 2 % solution Use as directed 15 mLs in the mouth or throat every 6 (six) hours as needed for mouth pain. 200 mL 0  . liraglutide (VICTOZA) 18 MG/3ML SOPN Inject 1.8 mg into the skin See admin instructions.     [name] losartan (COZAAR) 25 MG tablet Take 1 tablet (25 mg total) by mouth daily. 30 tablet 0  . metFORMIN (GLUCOPHAGE) 1000 MG tablet Take 1 tablet by mouth 2 (two) times  daily.    . metoprolol tartrate (LOPRESSOR) 25 MG tablet Take 0.5 tablets (12.5 mg total) by mouth 2 (two) times daily. 30 tablet 0  . mirtazapine (REMERON) 30 MG tablet Take 30 mg by mouth at bedtime.    . nitroGLYCERIN (NITROSTAT) 0.4 MG SL tablet Place 0.4 mg under the tongue every 5 (five) minutes as needed for chest pain.     . nortriptyline (PAMELOR) 10 MG capsule Take 2 capsules by mouth at bedtime.  3  . omeprazole (PRILOSEC) 40 MG capsule Take 40 mg by mouth 2 (two) times daily.    . promethazine (PHENERGAN) 6.25 MG/5ML syrup Take 6.25 mg by mouth every 6 (six) hours as needed for nausea or vomiting.    . rosuvastatin (CRESTOR) 20 MG tablet Take 40 mg by mouth daily.     . sucralfate (CARAFATE) 1 g tablet Take 1 g by mouth 4 (four) times daily.     [name] triamcinolone cream (KENALOG) 0.1 % Apply 1 application topically 2 (two) times daily as needed. (Patient taking differently: Apply 1 application topically 2 (two) times daily as needed (for rash). ) 30 g 0  . venlafaxine XR (EFFEXOR-XR) 75 MG 24 hr capsule Take 1 capsule (75 mg total) by mouth daily with breakfast. 30 capsule 0  . benzonatate (TESSALON PERLES) 100 MG capsule Take 1 capsule (100 mg total) by mouth every 6 (six) hours as needed for cough. (Patient not taking: Reported on 08/10/2018) 30 capsule 0   No current facility-administered medications for this visit.     REVIEW OF SYSTEMS:  Const: negative fever, negative chills, negative weight loss Eyes: negative diplopia or visual changes, negative eye  pain ENT: negative coryza, negative sore throat Resp: negative cough, hemoptysis, dyspnea Cards: negative for chest pain, palpitations, lower extremity edema GU: negative for frequency, dysuria and hematuria Skin: negative for rash and pruritus Heme: negative for easy bruising and gum/nose bleeding MS: negative for myalgias, arthralgias, back pain and muscle weakness Neurolo:negative for headaches, dizziness, vertigo, memory problems  Psych: negative for feelings of anxiety, depression  Pertinent Positives include : Objective:  VITALS:  BP 112/74 (BP Location: Right Arm, Patient Position: Sitting, Cuff Size: Normal)   Pulse 78   Temp 97.8 F (36.6 C) (Oral)   Ht '5\' 7"'$  (1.702 m)   BMI 35.10 kg/m PHYSICAL EXAM:  General: Alert, cooperative, no distress, obese Head: Normocephalic, without obvious abnormality, atraumatic. Eyes: Conjunctivae clear, anicteric sclerae. Pupils are equal Nose: Nares normal. No drainage or sinus tenderness. edentulous Neck: Supple, symmetrical, no adenopathy, thyroid: non tender no carotid bruit and no JVD. Supraclavicular pad of fat Lungs: Clear to auscultation bilaterally. No Wheezing or Rhonchi. No rales. Heart: Regular rate and rhythm, no murmur, rub or gallop. Abdomen: Soft,  distended. Bowel sounds normal. No masses Extremities: Extremities normal, atraumatic, no cyanosis. No edema. No clubbing Skin: small bruise on the left chest wall    Lymph: Cervical, supraclavicular normal. Neurologic: Grossly non-focal Health maintenance Vaccination  Vaccine Date last given comment  Influenza 03/15/18   Hepatitis B    Hepatitis A    Prevnar-PCV-13 08/10/18   Pneumovac-PPSV-23 08/01/17   TdaP 08/10/18   HPV    Shingrix ( zoster vaccine)     ______________________  Labs Lab Result  Date comment  HIV VL <20 08/03/18   CD4 823 ( 39.2) 08/03/18   Genotype     [id number]     HIV antibody     RPR NR 04/30/2018   Quantiferon  Gold NEG 04/28/18   Hep C ab NR  04/03/2016   Hepatitis B-ab,ag,c Core ab positive 04/16/15   Hepatitis A-IgM, IgG /T     Lipid 177/540/30/    GC/CHL     PAP     HB,PLT,Cr, LFT 13.1/166/1.2/N 12/06/18     Preventive  Procedure Result  Date comment  colonoscopy  March 2021   Mammogram     Dental exam edentulous    Opthal       Impression/Recommendation ? ?AIDS : well controlled- last Cd4 from February 2020 is 823 and Vl < 20 On triumeq-100% adherent  Small bruise left chest wall- improvingg no need to do anything as it is only 1 lesions  High Esr- Neg Autoimmune work up rheumatologist seen patient- no steroids for now Quantiferon Gold from 04/28/18 was negative    H/O left pleural effusion- s/p pleurocentesis- Neg [employer], not an exudate  Anemia-his last hemoglobin is 13.1.  Bone marrow(04/30/18) showed hypercellular bone marrow with 4% plasma cells negative for Congo red stain. Serum electrophoresis had a small M spike- unclear significance  DM-on liraglutide ( victoza) and Tresiba, metformin and glimepiride- saw his endocrinologist today  Hyperlipidemia on Rosuvastatin  Hypertriglyceridemia- started on Lopid by his endocrinologist  HTN -losartan, metoprolol  paranoid schizophrenia  Inactivity- told him to increase physical activity.  ? Follow-up 6 months ? ? Discussed with patient in detail

## 2018-12-31 LAB — T-HELPER CELLS CD4/CD8 %
% CD 4 Pos. Lymph.: 36.8 % (ref 30.8–58.5)
Absolute CD 4 Helper: 1030 /uL (ref 359–1519)
Basophils Absolute: 0.1 10*3/uL (ref 0.0–0.2)
Basos: 1 %
CD3+CD4+ Cells/CD3+CD8+ Cells Bld: 0.99 (ref 0.92–3.72)
CD3+CD8+ Cells # Bld: 1036 /uL — ABNORMAL HIGH (ref 109–897)
CD3+CD8+ Cells NFr Bld: 37 % — ABNORMAL HIGH (ref 12.0–35.5)
EOS (ABSOLUTE): 0 10*3/uL (ref 0.0–0.4)
Eos: 0 %
Hematocrit: 37.8 % (ref 37.5–51.0)
Hemoglobin: 13.1 g/dL (ref 13.0–17.7)
Immature Grans (Abs): 0.1 10*3/uL (ref 0.0–0.1)
Immature Granulocytes: 1 %
Lymphocytes Absolute: 2.8 10*3/uL (ref 0.7–3.1)
Lymphs: 33 %
MCH: 31.6 pg (ref 26.6–33.0)
MCHC: 34.7 g/dL (ref 31.5–35.7)
MCV: 91 fL (ref 79–97)
Monocytes Absolute: 0.9 10*3/uL (ref 0.1–0.9)
Monocytes: 10 %
Neutrophils Absolute: 4.7 10*3/uL (ref 1.4–7.0)
Neutrophils: 55 %
Platelets: 227 10*3/uL (ref 150–450)
RBC: 4.15 x10E6/uL (ref 4.14–5.80)
RDW: 13.4 % (ref 11.6–15.4)
WBC: 8.5 10*3/uL (ref 3.4–10.8)

## 2019-01-01 LAB — HIV-1 RNA QUANT-NO REFLEX-BLD
HIV 1 RNA Quant: <20 copies/mL
LOG10 HIV-1 RNA: UNDETERMINED log10copy/mL

## 2019-02-08 ENCOUNTER — Ambulatory Visit: Payer: Self-pay | Admitting: Infectious Diseases

## 2019-03-07 NOTE — Progress Notes (Signed)
Patient ID: Danny Fairy., male    DOB: [birth year], 58 y.o.   MRN: [id number]  HPI  Danny Cook is a 58 y/o male with a history of DM, hyperlipidemia, HTN, CKD, HIV, schizophrenia, obstructive sleep apnea, anxiety, osteoarthritis and chronic heart failure.   Echo report from 06/08/18 reviewed and showed an EF of 55-60%. Echo report from 03/25/18 reviewed and showed an EF of 55-60%. Echo report from 07/31/17 reviewed and showed an EF of 55-65%. Echo report from 05/11/17 reviewed and showed an EF of 55% along with mild AR/ TR/ Danny.  Has not been admitted or been in the ED in the last 6 months.   He presents today for a follow-up visit with a chief complaint of minimal fatigue upon moderate exertion. He describes this as chronic in nature having been present for several years. He has associated shortness of breath, pedal edema, light-headedness, anxiety and depression along with this. He denies any difficulty sleeping, abdominal distention, palpitations, chest pain, cough or weight gain. Does feel like his anxiety has worsened over these last few months due to COVID-19.   Past Medical History:  Diagnosis Date  . Anemia   . Anxiety   . Arthritis   . CHF (congestive heart failure) ([place])   . Chronic kidney disease    Renal Insufficiency Syndrome; Glomerulosclerosis 2013  . Complication of anesthesia   . Depressed   . Diabetes mellitus without complication ([place])   . High cholesterol   . HIV (human immunodeficiency virus infection) ([place])   . Hypertension   . 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 Paranoid disorder ([place])   . Schizophrenia, paranoid ([place])   . Sleep apnea    Past Surgical History:  Procedure Laterality Date  . COLONOSCOPY WITH PROPOFOL N/A 09/03/2015   Procedure: COLONOSCOPY WITH PROPOFOL;  Surgeon: Lollie Sails, MD;  Location: [facility] ENDOSCOPY;  Service: Endoscopy;  Laterality: N/A;  . DG TEETH FULL    . ESOPHAGOGASTRODUODENOSCOPY (EGD) WITH PROPOFOL N/A 07/01/2016   Procedure:  ESOPHAGOGASTRODUODENOSCOPY (EGD) WITH PROPOFOL;  Surgeon: Lollie Sails, MD;  Location: [facility] ENDOSCOPY;  Service: Endoscopy;  Laterality: N/A;   Family History  Problem Relation Age of Onset  . Heart attack Mother   . Diabetes Mellitus II Mother   . Mental illness Mother   . CAD Mother   . Heart attack Father   . CAD Father   . Hypertension Father    Social History   Tobacco Use  . Smoking status: Never Smoker  . Smokeless tobacco: Never Used  Substance Use Topics  . Alcohol use: No   Allergies  Allergen Reactions  . Trazodone And Nefazodone   . Viread [Tenofovir Disoproxil]     Damages kidneys  . Etodolac Rash   Prior to Admission medications   Medication Sig Start Date End Date Taking? Authorizing Provider  abacavir-dolutegravir-lamiVUDine (TRIUMEQ) 600-50-300 MG tablet Take 1 tablet by mouth daily. 08/10/18  Yes Tsosie Billing, MD  acetaminophen (TYLENOL) 325 MG tablet Take 2 tablets (650 mg total) by mouth every 6 (six) hours as needed for fever (Fever >101). 05/03/18  Yes Vaughan Basta, MD  albuterol (PROVENTIL HFA;VENTOLIN HFA) 108 (90 Base) MCG/ACT inhaler Inhale 2 puffs into the lungs every 6 (six) hours as needed for wheezing or shortness of breath. 04/14/18  Yes Loletha Grayer, MD  aspirin EC 325 MG tablet Take 325 mg by mouth daily.   Yes [provider]  benzonatate (TESSALON PERLES) 100 MG capsule Take 1  capsule (100 mg total) by mouth every 6 (six) hours as needed for cough. 04/24/18 04/24/19 Yes McShane, Gerda Diss, MD  bumetanide (BUMEX) 1 MG tablet Take 1 mg by mouth every other day.   Yes [provider]  butalbital-aspirin-caffeine Acquanetta Chain) 50-325-40 MG capsule Take 1-2 capsules by mouth every 6 (six) hours as needed for headache.   Yes [provider]  cyanocobalamin 1000 MCG tablet Take 1,000 mcg by mouth daily.   Yes [provider]  diazepam (VALIUM) 2 MG tablet Take 1 tablet (2 mg total) by mouth  every 8 (eight) hours as needed for anxiety (use for breakthrough anxiety). 05/03/18  Yes Vaughan Basta, MD  diphenhydrAMINE (BENADRYL) 50 MG capsule Take 1 capsule (50 mg total) by mouth at bedtime as needed for sleep. 11/09/17  Yes McNew, Tyson Babinski, MD  divalproex (DEPAKOTE ER) 250 MG 24 hr tablet Take 500 mg by mouth 2 (two) times daily.    Yes [provider]  FANAPT 12 MG TABS Take 12 mg by mouth 2 (two) times daily.  11/14/16  Yes [provider]  feeding supplement, GLUCERNA SHAKE, (GLUCERNA SHAKE) LIQD Take 237 mLs by mouth daily.   Yes [provider]  ferrous sulfate 325 (65 FE) MG tablet Take 325 mg by mouth daily with breakfast.   Yes [provider]  fluticasone (FLONASE) 50 MCG/ACT nasal spray Place 2 sprays into both nostrils daily as needed for allergies.   Yes [provider]  fluticasone (FLOVENT HFA) 220 MCG/ACT inhaler Inhale 2 puffs into the lungs 2 (two) times daily. Rinse out mouth afterwards 04/14/18  Yes Wieting, Richard, MD  gabapentin (NEURONTIN) 100 MG capsule Take 1 capsule (100 mg total) by mouth 3 (three) times daily. Patient taking differently: Take 300 mg by mouth 3 (three) times daily.  05/03/18  Yes Vaughan Basta, MD  gemfibrozil (LOPID) 600 MG tablet Take 600 mg by mouth 2 (two) times daily before a meal.   Yes [provider]  glimepiride (AMARYL) 4 MG tablet Take 1 tablet by mouth daily with breakfast. 05/14/18 05/14/19 Yes [provider]  guaiFENesin (MUCINEX) 600 MG 12 hr tablet Take 1 tablet (600 mg total) by mouth 2 (two) times daily. 08/10/18  Yes Tsosie Billing, MD  guaiFENesin-codeine (ROBITUSSIN AC) 100-10 MG/5ML syrup Take 5 mLs by mouth every 6 (six) hours as needed for cough.   Yes [provider]  haloperidol (HALDOL) 2 MG tablet Take 2 mg by mouth 2 (two) times daily.    Yes [provider]  Icosapent Ethyl (VASCEPA) 1 g CAPS Take 2 g by mouth 2 (two)  times daily.   Yes [provider]  Insulin Degludec (TRESIBA FLEXTOUCH) 200 UNIT/ML SOPN Inject 86 Units into the skin See admin instructions.    Yes [provider]  lidocaine (XYLOCAINE) 2 % solution Use as directed 15 mLs in the mouth or throat every 6 (six) hours as needed for mouth pain. 08/18/18  Yes Gregor Hams, MD  liraglutide (VICTOZA) 18 MG/3ML SOPN Inject 1.8 mg into the skin See admin instructions.    Yes [provider]  losartan (COZAAR) 25 MG tablet Take 1 tablet (25 mg total) by mouth daily. 05/04/18  Yes Vaughan Basta, MD  metFORMIN (GLUCOPHAGE) 1000 MG tablet Take 1 tablet by mouth 2 (two) times daily. 05/14/18 05/14/19 Yes [provider]  metoprolol tartrate (LOPRESSOR) 25 MG tablet Take 0.5 tablets (12.5 mg total) by mouth 2 (two) times daily. 05/03/18  Yes Vaughan Basta, MD  mirtazapine (REMERON) 30 MG tablet Take 30 mg by mouth at bedtime. 04/21/18  Yes [provider]  nitroGLYCERIN (NITROSTAT) 0.4 MG SL tablet Place 0.4 mg under the tongue every 5 (five) minutes as needed for chest pain.    Yes [provider]  nortriptyline (PAMELOR) 10 MG capsule Take 2 capsules by mouth at bedtime. 05/24/18  Yes [provider]  omeprazole (PRILOSEC) 40 MG capsule Take 40 mg by mouth 2 (two) times daily.   Yes [provider]  promethazine (PHENERGAN) 6.25 MG/5ML syrup Take 6.25 mg by mouth every 6 (six) hours as needed for nausea or vomiting.   Yes [provider]  rosuvastatin (CRESTOR) 20 MG tablet Take 40 mg by mouth daily.    Yes [provider]  sucralfate (CARAFATE) 1 g tablet Take 1 g by mouth 4 (four) times daily.  04/21/18  Yes [provider]  triamcinolone cream (KENALOG) 0.1 % Apply 1 application topically 2 (two) times daily as needed. Patient taking differently: Apply 1 application topically 2 (two) times daily as needed (for rash).  11/16/16  Yes Little,  Traci M, PA-C  venlafaxine XR (EFFEXOR-XR) 75 MG 24 hr capsule Take 1 capsule (75 mg total) by mouth daily with breakfast. 11/09/17  Yes McNew, Tyson Babinski, MD    Review of Systems  Constitutional: Positive for fatigue. Negative for appetite change.  HENT: Positive for rhinorrhea. Negative for congestion, postnasal drip and sore throat.   Eyes: Negative.   Respiratory: Positive for shortness of breath (when getting anxious). Negative for cough and chest tightness.   Cardiovascular: Positive for leg swelling (little bit in left ankle). Negative for chest pain and palpitations.  Gastrointestinal: Negative for abdominal distention and abdominal pain.  Endocrine: Negative.   Genitourinary: Negative.   Musculoskeletal: Positive for back pain (when standing for long periods of time). Negative for neck pain.  Skin: Negative.   Allergic/Immunologic: Negative.   Neurological: Positive for light-headedness. Negative for dizziness.  Hematological: Negative for adenopathy. Does not bruise/bleed easily.  Psychiatric/Behavioral: Positive for dysphoric mood. Negative for sleep disturbance. The patient is nervous/anxious.    Vitals:   03/09/19 1113  BP: 129/74  Pulse: 81  Resp: 18  SpO2: 98%  Weight: 221 lb 2 oz (100.3 kg)  Height: 5\' 7"  (1.702 m)   Wt Readings from Last 3 Encounters:  03/09/19 221 lb 2 oz (100.3 kg)  09/08/18 224 lb 2 oz (101.7 kg)  08/18/18 228 lb (103.4 kg)   Lab Results  Component Value Date   CREATININE 1.24 12/30/2018   CREATININE 1.09 08/03/2018   CREATININE 1.43 (H) 06/08/2018    Physical Exam  Constitutional: He is oriented to person, place, and time. He appears well-developed and well-nourished.  HENT:  Head: Normocephalic and atraumatic.  Neck: Normal range of motion. Neck supple. No JVD present.  Cardiovascular: Normal rate and regular rhythm.  Pulmonary/Chest: Effort normal. He has no wheezes. He has no rales.  Abdominal: Soft. He exhibits no distension. There  is no abdominal tenderness.  Musculoskeletal:        General: Edema (trace pitting edema in left lower leg) present. No tenderness.  Neurological: He is alert and oriented to person, place, and time.  Skin: Skin is warm and dry.  Psychiatric: He has a normal mood and affect. His behavior is normal. Thought content normal.  Nursing note and vitals reviewed.  Assessment & Plan:  1: Chronic heart failure with preserved ejection fraction- -  NYHA class II - euvolemic today - not weighing daily and he was encouraged to resume; reminded to call for an overnight weight gain of >2 pounds or a weekly weight gain of >5 pounds - weight down 3 pounds from last visit here 6 months ago - not adding salt to his food and doesn't think the food at the [facility] tastes salty - saw cardiology ([place]) 03/04/2019 - BNP 03/29/18 was 235.0  2: HTN- - BP looks good today - BMP from 12/30/2018 reviewed and showed sodium 139, potassium 4.6, creatinine 1.24 and GFR >60 - saw PCP ([place]) 02/07/2019  3: Diabetes- - A1c 12/06/2018 was 7.9% - saw endocrinologist Jenetta DownerDina Rich) 03/03/2019 - saw nephrologist ([place]) 05/10/18 & returns December 2020  4: Lymphedema- - stage 2 - now wearing support socks although doesn't have them on today - resolved  Facility medication list was reviewed.  Return in 6 months or sooner for any questions/ problems before then.

## 2019-03-09 ENCOUNTER — Ambulatory Visit: Payer: Medicare Other | Attending: Family | Admitting: Family

## 2019-03-09 ENCOUNTER — Encounter: Payer: Self-pay | Admitting: Family

## 2019-03-09 ENCOUNTER — Other Ambulatory Visit: Payer: Self-pay

## 2019-03-09 VITALS — BP 129/74 | HR 81 | Resp 18 | Ht 67.0 in | Wt 221.1 lb

## 2019-03-09 DIAGNOSIS — C469 Kaposi's sarcoma, unspecified: Secondary | ICD-10-CM | POA: Diagnosis not present

## 2019-03-09 DIAGNOSIS — Z79899 Other long term (current) drug therapy: Secondary | ICD-10-CM | POA: Insufficient documentation

## 2019-03-09 DIAGNOSIS — Z8249 Family history of ischemic heart disease and other diseases of the circulatory system: Secondary | ICD-10-CM | POA: Diagnosis not present

## 2019-03-09 DIAGNOSIS — G4733 Obstructive sleep apnea (adult) (pediatric): Secondary | ICD-10-CM | POA: Diagnosis not present

## 2019-03-09 DIAGNOSIS — Z794 Long term (current) use of insulin: Secondary | ICD-10-CM | POA: Diagnosis not present

## 2019-03-09 DIAGNOSIS — Z818 Family history of other mental and behavioral disorders: Secondary | ICD-10-CM | POA: Insufficient documentation

## 2019-03-09 DIAGNOSIS — Z886 Allergy status to analgesic agent status: Secondary | ICD-10-CM | POA: Insufficient documentation

## 2019-03-09 DIAGNOSIS — F329 Major depressive disorder, single episode, unspecified: Secondary | ICD-10-CM | POA: Insufficient documentation

## 2019-03-09 DIAGNOSIS — Z7982 Long term (current) use of aspirin: Secondary | ICD-10-CM | POA: Insufficient documentation

## 2019-03-09 DIAGNOSIS — Z7951 Long term (current) use of inhaled steroids: Secondary | ICD-10-CM | POA: Diagnosis not present

## 2019-03-09 DIAGNOSIS — Z883 Allergy status to other anti-infective agents status: Secondary | ICD-10-CM | POA: Diagnosis not present

## 2019-03-09 DIAGNOSIS — Z888 Allergy status to other drugs, medicaments and biological substances status: Secondary | ICD-10-CM | POA: Insufficient documentation

## 2019-03-09 DIAGNOSIS — M1990 Unspecified osteoarthritis, unspecified site: Secondary | ICD-10-CM | POA: Diagnosis not present

## 2019-03-09 DIAGNOSIS — I5032 Chronic diastolic (congestive) heart failure: Secondary | ICD-10-CM | POA: Insufficient documentation

## 2019-03-09 DIAGNOSIS — F200 Paranoid schizophrenia: Secondary | ICD-10-CM | POA: Diagnosis not present

## 2019-03-09 DIAGNOSIS — I10 Essential (primary) hypertension: Secondary | ICD-10-CM

## 2019-03-09 DIAGNOSIS — N189 Chronic kidney disease, unspecified: Secondary | ICD-10-CM | POA: Diagnosis not present

## 2019-03-09 DIAGNOSIS — I890 Lymphedema, not elsewhere classified: Secondary | ICD-10-CM | POA: Insufficient documentation

## 2019-03-09 DIAGNOSIS — Z833 Family history of diabetes mellitus: Secondary | ICD-10-CM | POA: Diagnosis not present

## 2019-03-09 DIAGNOSIS — N183 Chronic kidney disease, stage 3 unspecified: Secondary | ICD-10-CM

## 2019-03-09 DIAGNOSIS — D649 Anemia, unspecified: Secondary | ICD-10-CM | POA: Insufficient documentation

## 2019-03-09 DIAGNOSIS — B20 Human immunodeficiency virus [HIV] disease: Secondary | ICD-10-CM | POA: Insufficient documentation

## 2019-03-09 DIAGNOSIS — E1122 Type 2 diabetes mellitus with diabetic chronic kidney disease: Secondary | ICD-10-CM | POA: Diagnosis not present

## 2019-03-09 DIAGNOSIS — I130 Hypertensive heart and chronic kidney disease with heart failure and stage 1 through stage 4 chronic kidney disease, or unspecified chronic kidney disease: Secondary | ICD-10-CM | POA: Diagnosis present

## 2019-03-09 DIAGNOSIS — E7800 Pure hypercholesterolemia, unspecified: Secondary | ICD-10-CM | POA: Diagnosis not present

## 2019-03-09 NOTE — Patient Instructions (Addendum)
Resume weighing daily and call for an overnight weight gain of > 2 pounds or a weekly weight gain of >5 pounds. 

## 2019-03-18 ENCOUNTER — Other Ambulatory Visit: Payer: Self-pay | Admitting: Physical Medicine and Rehabilitation

## 2019-03-18 DIAGNOSIS — M5416 Radiculopathy, lumbar region: Secondary | ICD-10-CM

## 2019-03-31 ENCOUNTER — Ambulatory Visit
Admission: RE | Admit: 2019-03-31 | Discharge: 2019-03-31 | Disposition: A | Discharge status: Home / Self Care | Payer: Medicare Other | Source: Ambulatory Visit | Attending: Physical Medicine and Rehabilitation | Admitting: Physical Medicine and Rehabilitation

## 2019-03-31 ENCOUNTER — Other Ambulatory Visit: Payer: Self-pay

## 2019-03-31 DIAGNOSIS — M5416 Radiculopathy, lumbar region: Secondary | ICD-10-CM

## 2019-05-20 ENCOUNTER — Emergency Department
Admission: EM | Admit: 2019-05-20 | Discharge: 2019-05-20 | Disposition: A | Discharge status: Home / Self Care | Payer: Medicare Other | Attending: Student | Admitting: Student

## 2019-05-20 ENCOUNTER — Other Ambulatory Visit: Payer: Self-pay

## 2019-05-20 ENCOUNTER — Encounter: Payer: Self-pay | Admitting: Emergency Medicine

## 2019-05-20 DIAGNOSIS — R69 Illness, unspecified: Secondary | ICD-10-CM | POA: Diagnosis not present

## 2019-05-20 DIAGNOSIS — I130 Hypertensive heart and chronic kidney disease with heart failure and stage 1 through stage 4 chronic kidney disease, or unspecified chronic kidney disease: Secondary | ICD-10-CM | POA: Insufficient documentation

## 2019-05-20 DIAGNOSIS — R110 Nausea: Secondary | ICD-10-CM | POA: Diagnosis present

## 2019-05-20 DIAGNOSIS — N189 Chronic kidney disease, unspecified: Secondary | ICD-10-CM | POA: Diagnosis not present

## 2019-05-20 DIAGNOSIS — Z79899 Other long term (current) drug therapy: Secondary | ICD-10-CM | POA: Insufficient documentation

## 2019-05-20 DIAGNOSIS — E1122 Type 2 diabetes mellitus with diabetic chronic kidney disease: Secondary | ICD-10-CM | POA: Diagnosis not present

## 2019-05-20 DIAGNOSIS — I5032 Chronic diastolic (congestive) heart failure: Secondary | ICD-10-CM | POA: Insufficient documentation

## 2019-05-20 DIAGNOSIS — Z7982 Long term (current) use of aspirin: Secondary | ICD-10-CM | POA: Insufficient documentation

## 2019-05-20 DIAGNOSIS — R6889 Other general symptoms and signs: Secondary | ICD-10-CM

## 2019-05-20 DIAGNOSIS — Z21 Asymptomatic human immunodeficiency virus [HIV] infection status: Secondary | ICD-10-CM | POA: Diagnosis not present

## 2019-05-20 LAB — TROPONIN I (HIGH SENSITIVITY): Troponin I (High Sensitivity): 7 ng/L (ref <18)

## 2019-05-20 LAB — COMPREHENSIVE METABOLIC PANEL
ALT: 17 U/L (ref 0–44)
AST: 19 U/L (ref 15–41)
Albumin: 4.1 g/dL (ref 3.5–5.0)
Alkaline Phosphatase: 110 U/L (ref 38–126)
Anion gap: 17 — ABNORMAL HIGH (ref 5–15)
BUN: 17 mg/dL (ref 6–20)
CO2: 21 mmol/L — ABNORMAL LOW (ref 22–32)
Calcium: 10.1 mg/dL (ref 8.9–10.3)
Chloride: 104 mmol/L (ref 98–111)
Creatinine, Ser: 1.17 mg/dL (ref 0.61–1.24)
GFR calc Af Amer: >60 mL/min (ref >60)
GFR calc non Af Amer: >60 mL/min (ref >60)
Glucose, Bld: 98 mg/dL (ref 70–99)
Potassium: 4.1 mmol/L (ref 3.5–5.1)
Sodium: 142 mmol/L (ref 135–145)
Total Bilirubin: 0.7 mg/dL (ref 0.3–1.2)
Total Protein: 8.2 g/dL — ABNORMAL HIGH (ref 6.5–8.1)

## 2019-05-20 LAB — CBC
HCT: 38.4 % — ABNORMAL LOW (ref 39.0–52.0)
Hemoglobin: 13.4 g/dL (ref 13.0–17.0)
MCH: 31.3 pg (ref 26.0–34.0)
MCHC: 34.9 g/dL (ref 30.0–36.0)
MCV: 89.7 fL (ref 80.0–100.0)
Platelets: 215 10*3/uL (ref 150–400)
RBC: 4.28 MIL/uL (ref 4.22–5.81)
RDW: 12.8 % (ref 11.5–15.5)
WBC: 8.3 10*3/uL (ref 4.0–10.5)
nRBC: 0 % (ref 0.0–0.2)

## 2019-05-20 LAB — URINALYSIS, COMPLETE (UACMP) WITH MICROSCOPIC
Bacteria, UA: NONE SEEN
Bilirubin Urine: NEGATIVE
Glucose, UA: NEGATIVE mg/dL
Hgb urine dipstick: NEGATIVE
Ketones, ur: NEGATIVE mg/dL
Leukocytes,Ua: NEGATIVE
Nitrite: NEGATIVE
Protein, ur: 100 mg/dL — AB
Specific Gravity, Urine: 1.014 (ref 1.005–1.030)
Squamous Epithelial / HPF: NONE SEEN (ref 0–5)
WBC, UA: NONE SEEN WBC/hpf (ref 0–5)
pH: 5 (ref 5.0–8.0)

## 2019-05-20 LAB — GLUCOSE, CAPILLARY
Glucose-Capillary: 67 mg/dL — ABNORMAL LOW (ref 70–99)
Glucose-Capillary: 78 mg/dL (ref 70–99)

## 2019-05-20 LAB — LIPASE, BLOOD: Lipase: 41 U/L (ref 11–51)

## 2019-05-20 LAB — T4, FREE: Free T4: 0.62 ng/dL (ref 0.61–1.12)

## 2019-05-20 LAB — TSH: TSH: 0.935 u[IU]/mL (ref 0.350–4.500)

## 2019-05-20 MED ORDER — SODIUM CHLORIDE 0.9 % IV BOLUS
500.0000 mL | Freq: Once | INTRAVENOUS | Status: AC
  Administered 2019-05-20: 500 mL via INTRAVENOUS

## 2019-05-20 MED ORDER — ONDANSETRON HCL 4 MG/2ML IJ SOLN
4.0000 mg | Freq: Once | INTRAMUSCULAR | Status: AC
  Administered 2019-05-20: 4 mg via INTRAVENOUS
  Filled 2019-05-20: qty 2

## 2019-05-20 MED ORDER — SODIUM CHLORIDE 0.9% FLUSH: 3.0000 mL | Freq: Once | INTRAVENOUS | Status: DC

## 2019-05-20 NOTE — ED Triage Notes (Signed)
Pt to ED via [facility] from [facility]. Pt states that he has been having nausea x 3-4 days and dizziness that started 2 days ago. Pt states that he is thinks that he may be dehydrated. Pt denies vomiting or abdominal pain. Pt is in NAD.

## 2019-05-20 NOTE — ED Notes (Signed)
Patient is phoning a friend for a ride.

## 2019-05-20 NOTE — Discharge Instructions (Addendum)
Thank you for letting us take care of you in the emergency department today.  ° °Please continue to take any regular, prescribed medications.  ° °Please follow up with: °- Your primary care doctor to review your ER visit and follow up on your symptoms.  ° °Please return to the ER for any new or worsening symptoms.  ° °

## 2019-05-20 NOTE — ED Notes (Signed)
Pt given OJx2 for low BG.

## 2019-05-20 NOTE — ED Notes (Signed)
First nurse note: per EMS, pt is from [facility] family care, pt is nauseated and dizzy, possibly dehydrated, denies vomiting in the past 2 days, A&O. NAD, SAT 94% RA, 148/84.

## 2019-05-20 NOTE — ED Notes (Signed)
Pt wheeled to restroom as he states he must urinate before having the IV placed. Requesting to have his BG checked. Will complete this soon.

## 2019-05-20 NOTE — ED Notes (Signed)
Patient ambulated to hallway bathroom with a steady gait. 

## 2019-05-20 NOTE — ED Provider Notes (Signed)
[facility] Emergency Department Provider Note  ____________________________________________   First MD Initiated Contact with Patient 05/20/19 1247     (approximate)  I have reviewed the triage vital signs and the nursing notes.  History  Chief Complaint Nausea    HPI Danny Cook. is a 58 y.o. male with hx of CKD, HIV, DM, schizophrenia who presents for feeling generally unwell. He states he has been feeling unwell for several weeks now, but more recently it has gotten worse. He denies any pain. He reports a decreased appetite with some mild nausea. He states he does not have a very good diet and does not eat regularly, and doesn't drink water. He suspects he is dehydrated, and thinks this may be the etiology of his symptoms. He denies any fevers, chest pain, SOB, vomiting, diarrhea, urinary symptoms.   Last HIV RNA was undetectable 01/17/19. Absolute CD4 count normal 12/30/18.    He reports compliance w/ all of his medications.    Past Medical Hx Past Medical History:  Diagnosis Date  . Anemia   . Anxiety   . Arthritis   . CHF (congestive heart failure) ([place])   . Chronic kidney disease    Renal Insufficiency Syndrome; Glomerulosclerosis 2013  . Complication of anesthesia   . Depressed   . Diabetes mellitus without complication ([place])   . High cholesterol   . HIV (human immunodeficiency virus infection) ([place])   . Hypertension   . 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 Paranoid disorder ([place])   . Schizophrenia, paranoid ([place])   . Sleep apnea     Problem List Patient Active Problem List   Diagnosis Date Noted  . NSTEMI (non-ST elevated myocardial infarction) ([place]) 06/07/2018  . Lymphedema 05/12/2018  . Pleural effusion   . FUO (fever of unknown origin)   . MGUS (monoclonal gammopathy of unknown significance)   . 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 Healthcare associated bacterial pneumonia 04/25/2018  . Community acquired  pneumonia 04/12/2018  . Pneumonia 04/12/2018  . AKI (acute kidney injury) ([place]) 03/23/2018  . Pericarditis with effusion 03/23/2018  . Schizoaffective disorder, depressive type ([place]) 11/04/2017  . HIV positive ([place]) 11/04/2017  . Orthostatic dizziness 07/31/2017  . Chronic diastolic heart failure ([place]) 07/29/2017  . HTN (hypertension) 07/29/2017  . Diabetes ([place]) 07/29/2017  . Schizophrenia ([place]) 07/29/2017    Past Surgical Hx Past Surgical History:  Procedure Laterality Date  . COLONOSCOPY WITH PROPOFOL N/A 09/03/2015   Procedure: COLONOSCOPY WITH PROPOFOL;  Surgeon: Lollie Sails, MD;  Location: [facility] ENDOSCOPY;  Service: Endoscopy;  Laterality: N/A;  . DG TEETH FULL    . ESOPHAGOGASTRODUODENOSCOPY (EGD) WITH PROPOFOL N/A 07/01/2016   Procedure: ESOPHAGOGASTRODUODENOSCOPY (EGD) WITH PROPOFOL;  Surgeon: Lollie Sails, MD;  Location: [facility] ENDOSCOPY;  Service: Endoscopy;  Laterality: N/A;    Medications Prior to Admission medications   Medication Sig Start Date End Date Taking? Authorizing Provider  abacavir-dolutegravir-lamiVUDine (TRIUMEQ) 600-50-300 MG tablet Take 1 tablet by mouth daily. 08/10/18   Tsosie Billing, MD  acetaminophen (TYLENOL) 325 MG tablet Take 2 tablets (650 mg total) by mouth every 6 (six) hours as needed for fever (Fever >101). 05/03/18   Vaughan Basta, MD  albuterol (PROVENTIL HFA;VENTOLIN HFA) 108 (90 Base) MCG/ACT inhaler Inhale 2 puffs into the lungs every 6 (six) hours as needed for wheezing or shortness of breath. 04/14/18   Loletha Grayer, MD  aspirin EC 325 MG tablet Take 325 mg by mouth daily.  [provider]  bumetanide (BUMEX) 1 MG tablet Take 1 mg by mouth every other day.    [provider]  butalbital-aspirin-caffeine Acquanetta Chain) 50-325-40 MG capsule Take 1-2 capsules by mouth every 6 (six) hours as needed for headache.    [provider]  cyanocobalamin 1000 MCG tablet Take 1,000 mcg by mouth daily.     [provider]  diazepam (VALIUM) 2 MG tablet Take 1 tablet (2 mg total) by mouth every 8 (eight) hours as needed for anxiety (use for breakthrough anxiety). 05/03/18   Vaughan Basta, MD  diphenhydrAMINE (BENADRYL) 50 MG capsule Take 1 capsule (50 mg total) by mouth at bedtime as needed for sleep. 11/09/17   McNew, Tyson Babinski, MD  divalproex (DEPAKOTE ER) 250 MG 24 hr tablet Take 500 mg by mouth 2 (two) times daily.     [provider]  FANAPT 12 MG TABS Take 12 mg by mouth 2 (two) times daily.  11/14/16   [provider]  feeding supplement, GLUCERNA SHAKE, (GLUCERNA SHAKE) LIQD Take 237 mLs by mouth daily.    [provider]  ferrous sulfate 325 (65 FE) MG tablet Take 325 mg by mouth daily with breakfast.    [provider]  fluticasone (FLONASE) 50 MCG/ACT nasal spray Place 2 sprays into both nostrils daily as needed for allergies.    [provider]  fluticasone (FLOVENT HFA) 220 MCG/ACT inhaler Inhale 2 puffs into the lungs 2 (two) times daily. Rinse out mouth afterwards 04/14/18   Loletha Grayer, MD  gabapentin (NEURONTIN) 100 MG capsule Take 1 capsule (100 mg total) by mouth 3 (three) times daily. Patient taking differently: Take 300 mg by mouth 3 (three) times daily.  05/03/18   Vaughan Basta, MD  gemfibrozil (LOPID) 600 MG tablet Take 600 mg by mouth 2 (two) times daily before a meal.    [provider]  guaiFENesin (MUCINEX) 600 MG 12 hr tablet Take 1 tablet (600 mg total) by mouth 2 (two) times daily. 08/10/18   Tsosie Billing, MD  guaiFENesin-codeine (ROBITUSSIN AC) 100-10 MG/5ML syrup Take 5 mLs by mouth every 6 (six) hours as needed for cough.    [provider]  haloperidol (HALDOL) 2 MG tablet Take 2 mg by mouth 2 (two) times daily.     [provider]  Icosapent Ethyl (VASCEPA) 1 g CAPS Take 2 g by mouth 2 (two) times daily.    [provider]  Insulin Degludec (TRESIBA  FLEXTOUCH) 200 UNIT/ML SOPN Inject 86 Units into the skin See admin instructions.     [provider]  lidocaine (XYLOCAINE) 2 % solution Use as directed 15 mLs in the mouth or throat every 6 (six) hours as needed for mouth pain. 08/18/18   Gregor Hams, MD  liraglutide (VICTOZA) 18 MG/3ML SOPN Inject 1.8 mg into the skin See admin instructions.     [provider]  losartan (COZAAR) 25 MG tablet Take 1 tablet (25 mg total) by mouth daily. 05/04/18   Vaughan Basta, MD  metoprolol tartrate (LOPRESSOR) 25 MG tablet Take 0.5 tablets (12.5 mg total) by mouth 2 (two) times daily. 05/03/18   Vaughan Basta, MD  mirtazapine (REMERON) 30 MG tablet Take 30 mg by mouth at bedtime. 04/21/18   [provider]  nitroGLYCERIN (NITROSTAT) 0.4 MG SL tablet Place 0.4 mg under the tongue every 5 (five) minutes as needed for chest pain.     [provider]  nortriptyline (PAMELOR) 10 MG capsule  Take 2 capsules by mouth at bedtime. 05/24/18   [provider]  omeprazole (PRILOSEC) 40 MG capsule Take 40 mg by mouth 2 (two) times daily.    [provider]  promethazine (PHENERGAN) 6.25 MG/5ML syrup Take 6.25 mg by mouth every 6 (six) hours as needed for nausea or vomiting.    [provider]  rosuvastatin (CRESTOR) 20 MG tablet Take 40 mg by mouth daily.     [provider]  sucralfate (CARAFATE) 1 g tablet Take 1 g by mouth 4 (four) times daily.  04/21/18   [provider]  triamcinolone cream (KENALOG) 0.1 % Apply 1 application topically 2 (two) times daily as needed. Patient taking differently: Apply 1 application topically 2 (two) times daily as needed (for rash).  11/16/16   Little, Traci M, PA-C  venlafaxine XR (EFFEXOR-XR) 75 MG 24 hr capsule Take 1 capsule (75 mg total) by mouth daily with breakfast. 11/09/17   McNew, Tyson Babinski, MD    Allergies Trazodone and nefazodone, Viread [tenofovir disoproxil], and Etodolac   Family Hx Family History  Problem Relation Age of Onset  . Heart attack Mother   . Diabetes Mellitus II Mother   . Mental illness Mother   . CAD Mother   . Heart attack Father   . CAD Father   . Hypertension Father     Social Hx Social History   Tobacco Use  . Smoking status: Never Smoker  . Smokeless tobacco: Never Used  Substance Use Topics  . Alcohol use: No  . Drug use: No     Review of Systems  Constitutional: Negative for fever, chills. + generally unwell Eyes: Negative for visual changes. ENT: Negative for sore throat. Cardiovascular: Negative for chest pain. Respiratory: Negative for shortness of breath. Gastrointestinal: + for nausea.  Genitourinary: Negative for dysuria. Musculoskeletal: Negative for leg swelling. Skin: Negative for rash. Neurological: Negative for for headaches.   Physical Exam  Vital Signs: ED Triage Vitals  Enc Vitals Group     BP 05/20/19 1112 (!) 148/79     Pulse Rate 05/20/19 1112 79     Resp 05/20/19 1112 15     Temp 05/20/19 1112 97.9 F (36.6 C)     Temp Source 05/20/19 1112 Oral     SpO2 05/20/19 1112 92 %     Weight 05/20/19 1112 223 lb (101.2 kg)     Height 05/20/19 1112 5\' 7"  (1.702 m)     Head Circumference --      Peak Flow --      Pain Score 05/20/19 1126 0     Pain Loc --      Pain Edu? --      Excl. in [place]? --     Constitutional: Alert and oriented.  Head: Normocephalic. Atraumatic. Eyes: Conjunctivae clear. Sclera anicteric. Nose: No congestion. No rhinorrhea. Mouth/Throat: Wearing mask.  Neck: No stridor.   Cardiovascular: Normal rate, regular rhythm. Extremities well perfused. Respiratory: Normal respiratory effort.  Lungs CTAB. Gastrointestinal: Soft. Non-tender. Non-distended.  Musculoskeletal: No lower extremity edema. No deformities. Neurologic:  Normal speech and language. No gross focal neurologic deficits are appreciated.  Skin: Skin is warm, dry and intact. No rash noted. Psychiatric: Mood  and affect are appropriate for situation.  EKG  Personally reviewed.   Rate: 75 Rhythm: sinus Axis: normal Intervals: WNL No STEMI    Radiology  N/A   Procedures  Procedure(s) performed (including critical care):  Procedures   Initial Impression / Assessment  and Plan / ED Course  58 y.o. male who presents to the ED for feeling generally unwell, as above.  Ddx: electrolyte abnormality, dehydration, poor diet, ACS, UTI, thyroid disease, anemia.   CBC w/o actionable derangements. Mildly decreased bicarb, but normal glucose. No ketones in urine and ptient is not on an SGLT-2 inhibitor, therefore unlikely to be euglycemic DKA. Thyroid studies WNL. Troponin negative. Patient reports feeling improved after eating (had mildly decreased POC glucose) and after fluids. He feels comfortable with discharge. Advised healthy diet, adequate hydration, and PCP follow up. He voices understanding and is comfortable w/ the plan and discharge. Given return precautions.    Final Clinical Impression(s) / ED Diagnosis  Final diagnoses:  Generally unwell       Note:  This document was prepared using Dragon voice recognition software and may include unintentional dictation errors.   Lilia Pro., MD 05/21/19 [phone]

## 2019-05-20 NOTE — ED Notes (Signed)
Next trop sent to lab.

## 2019-05-20 NOTE — ED Notes (Signed)
See triage note Presents with weakness  States he feels dehydrated   Denies any fever or v/d  Denies nay specific

## 2019-06-10 ENCOUNTER — Other Ambulatory Visit: Payer: Self-pay | Admitting: Neurosurgery

## 2019-06-20 ENCOUNTER — Inpatient Hospital Stay: Admission: RE | Admit: 2019-06-20 | Payer: Medicare Other | Source: Ambulatory Visit

## 2019-06-22 ENCOUNTER — Encounter
Admission: RE | Admit: 2019-06-22 | Discharge: 2019-06-22 | Disposition: A | Discharge status: Home / Self Care | Payer: Medicare Other | Source: Ambulatory Visit | Attending: Neurosurgery | Admitting: Neurosurgery

## 2019-06-22 ENCOUNTER — Other Ambulatory Visit: Payer: Self-pay

## 2019-06-22 HISTORY — DX: Transient cerebral ischemic attack, unspecified: G45.9

## 2019-06-22 HISTORY — DX: Gastro-esophageal reflux disease without esophagitis: K21.9

## 2019-06-22 HISTORY — DX: Pneumonia, unspecified organism: J18.9

## 2019-06-22 NOTE — Patient Instructions (Addendum)
Your procedure is scheduled on: Wednesday, June 29, 2019 Report to Day Surgery on the 2nd floor of the [facility]. To find out your arrival time, please call [phone] between 1PM - 3PM on: Tuesday, June 28, 2019  REMEMBER: Instructions that are not followed completely may result in serious medical risk, up to and including death; or upon the discretion of your surgeon and anesthesiologist your surgery may need to be rescheduled.  Do not eat food after midnight the night before surgery.  No gum chewing, lozengers or hard candies.  You may however, drink water up to 2 hours before you are scheduled to arrive for your surgery. Do not drink anything within 2 hours of the start of your surgery.  No Alcohol for 24 hours before or after surgery.  No Smoking including e-cigarettes for 24 hours prior to surgery.  No chewable tobacco products for at least 6 hours prior to surgery.  No nicotine patches on the day of surgery.  On the morning of surgery brush your teeth with toothpaste and water, you may rinse your mouth with mouthwash if you wish. Do not swallow any toothpaste or mouthwash.  Notify your doctor if there is any change in your medical condition (cold, fever, infection).  Do not wear jewelry, make-up, hairpins, clips or nail polish.  Do not wear lotions, powders, or perfumes.   Do not shave 48 hours prior to surgery.   Contacts and dentures may not be worn into surgery.  Do not bring valuables to the hospital, including drivers license, insurance or credit cards.  [facility] is not responsible for any belongings or valuables.   TAKE THESE MEDICATIONS THE MORNING OF SURGERY:  1.  Abacavir-dolutegravir-lamivudine (Triumeq) 2.  Albuterol inhaler 3.  fanapt 4.  flovent inhaler 5.  Gabapentin 6.  Gemfibrozil (Lopid) 7.  Haloperidol (Haldol) 8.  memantine (Namenda) 9.  Metoprolol 10.  Omeprazole (Prilosec) 11.  Sucralfate (Carafate) 12.  Venlafaxine  (Effexor)  Use CHG Soap as directed on instruction sheet.  Use inhalers on the day of surgery and bring to the hospital.  Bring your C-PAP to the hospital with you in case you may have to spend the night.   Stop Metformin 2 days prior to surgery. Last day to take is Sunday, Dec. 22; resume AFTER surgery.  Take 1/2 of usual insulin dose the night before surgery and none on the morning of surgery.  Follow recommendations from Cardiologist, Pulmonologist or PCP regarding stopping Aspirin. STOP NOW!  Now!  Stop FIORINAL (butalbital-aspirin-caffeine) and Anti-inflammatories (NSAIDS) such as Advil, Aleve, Ibuprofen, Motrin, Naproxen, Naprosyn and Aspirin based products such as Excedrin, Goodys Powder, BC Powder. (May take Tylenol or Acetaminophen if needed.)  NOW!  Stop ANY OVER THE COUNTER supplements until after surgery. (Divide) (May continue Vitamin D, Vitamin B, and multivitamin.)  Wear comfortable clothing (specific to your surgery type) to the hospital.  If you are being discharged the day of surgery, you will not be allowed to drive home. You will need a responsible adult to drive you home and stay with you that night.   If you are taking public transportation, you will need to have a responsible adult with you. Please confirm with your physician that it is acceptable to use public transportation.   Please call [phone] if you have any questions about these instructions.

## 2019-06-27 ENCOUNTER — Encounter: Admission: RE | Admit: 2019-06-27 | Payer: Medicare Other | Source: Ambulatory Visit

## 2019-06-27 ENCOUNTER — Other Ambulatory Visit: Payer: Medicare Other

## 2019-06-29 ENCOUNTER — Ambulatory Visit: Admission: RE | Admit: 2019-06-29 | Payer: Medicare Other | Source: Home / Self Care | Admitting: Neurosurgery

## 2019-06-29 ENCOUNTER — Encounter: Admission: RE | Payer: Self-pay | Source: Home / Self Care

## 2019-06-29 SURGERY — LUMBAR LAMINECTOMY/DECOMPRESSION MICRODISCECTOMY 2 LEVELS
Anesthesia: General

## 2019-07-19 ENCOUNTER — Ambulatory Visit: Payer: Medicare Other | Admitting: Infectious Diseases

## 2019-07-21 ENCOUNTER — Ambulatory Visit: Payer: Medicare Other | Attending: Infectious Diseases | Admitting: Infectious Diseases

## 2019-07-21 ENCOUNTER — Encounter: Payer: Self-pay | Admitting: Infectious Diseases

## 2019-07-21 ENCOUNTER — Other Ambulatory Visit
Admission: RE | Admit: 2019-07-21 | Discharge: 2019-07-21 | Disposition: A | Discharge status: Home / Self Care | Payer: Medicare Other | Attending: Infectious Diseases | Admitting: Infectious Diseases

## 2019-07-21 ENCOUNTER — Other Ambulatory Visit: Payer: Self-pay

## 2019-07-21 VITALS — BP 131/86 | HR 73 | Temp 97.7°F | Resp 16 | Ht 67.0 in | Wt 219.0 lb

## 2019-07-21 DIAGNOSIS — Z888 Allergy status to other drugs, medicaments and biological substances status: Secondary | ICD-10-CM | POA: Insufficient documentation

## 2019-07-21 DIAGNOSIS — F200 Paranoid schizophrenia: Secondary | ICD-10-CM | POA: Diagnosis not present

## 2019-07-21 DIAGNOSIS — Z79899 Other long term (current) drug therapy: Secondary | ICD-10-CM | POA: Insufficient documentation

## 2019-07-21 DIAGNOSIS — E1122 Type 2 diabetes mellitus with diabetic chronic kidney disease: Secondary | ICD-10-CM | POA: Diagnosis not present

## 2019-07-21 DIAGNOSIS — D649 Anemia, unspecified: Secondary | ICD-10-CM | POA: Insufficient documentation

## 2019-07-21 DIAGNOSIS — I509 Heart failure, unspecified: Secondary | ICD-10-CM | POA: Insufficient documentation

## 2019-07-21 DIAGNOSIS — M48062 Spinal stenosis, lumbar region with neurogenic claudication: Secondary | ICD-10-CM | POA: Diagnosis not present

## 2019-07-21 DIAGNOSIS — I110 Hypertensive heart disease with heart failure: Secondary | ICD-10-CM

## 2019-07-21 DIAGNOSIS — Z7982 Long term (current) use of aspirin: Secondary | ICD-10-CM | POA: Diagnosis not present

## 2019-07-21 DIAGNOSIS — Z818 Family history of other mental and behavioral disorders: Secondary | ICD-10-CM | POA: Insufficient documentation

## 2019-07-21 DIAGNOSIS — N189 Chronic kidney disease, unspecified: Secondary | ICD-10-CM | POA: Diagnosis not present

## 2019-07-21 DIAGNOSIS — I130 Hypertensive heart and chronic kidney disease with heart failure and stage 1 through stage 4 chronic kidney disease, or unspecified chronic kidney disease: Secondary | ICD-10-CM | POA: Diagnosis not present

## 2019-07-21 DIAGNOSIS — Z833 Family history of diabetes mellitus: Secondary | ICD-10-CM | POA: Insufficient documentation

## 2019-07-21 DIAGNOSIS — Z794 Long term (current) use of insulin: Secondary | ICD-10-CM | POA: Diagnosis not present

## 2019-07-21 DIAGNOSIS — Z8249 Family history of ischemic heart disease and other diseases of the circulatory system: Secondary | ICD-10-CM | POA: Diagnosis not present

## 2019-07-21 DIAGNOSIS — Z885 Allergy status to narcotic agent status: Secondary | ICD-10-CM

## 2019-07-21 DIAGNOSIS — F419 Anxiety disorder, unspecified: Secondary | ICD-10-CM

## 2019-07-21 DIAGNOSIS — E785 Hyperlipidemia, unspecified: Secondary | ICD-10-CM | POA: Diagnosis not present

## 2019-07-21 DIAGNOSIS — E781 Pure hyperglyceridemia: Secondary | ICD-10-CM | POA: Diagnosis not present

## 2019-07-21 DIAGNOSIS — B20 Human immunodeficiency virus [HIV] disease: Secondary | ICD-10-CM | POA: Insufficient documentation

## 2019-07-21 DIAGNOSIS — E119 Type 2 diabetes mellitus without complications: Secondary | ICD-10-CM

## 2019-07-21 DIAGNOSIS — Z7984 Long term (current) use of oral hypoglycemic drugs: Secondary | ICD-10-CM

## 2019-07-21 DIAGNOSIS — Z8709 Personal history of other diseases of the respiratory system: Secondary | ICD-10-CM

## 2019-07-21 LAB — COMPREHENSIVE METABOLIC PANEL
ALT: 17 U/L (ref 0–44)
AST: 20 U/L (ref 15–41)
Albumin: 4.6 g/dL (ref 3.5–5.0)
Alkaline Phosphatase: 108 U/L (ref 38–126)
Anion gap: 16 — ABNORMAL HIGH (ref 5–15)
BUN: 19 mg/dL (ref 6–20)
CO2: 30 mmol/L (ref 22–32)
Calcium: 10.1 mg/dL (ref 8.9–10.3)
Chloride: 100 mmol/L (ref 98–111)
Creatinine, Ser: 1.26 mg/dL — ABNORMAL HIGH (ref 0.61–1.24)
GFR calc Af Amer: >60 mL/min (ref >60)
GFR calc non Af Amer: >60 mL/min (ref >60)
Glucose, Bld: 95 mg/dL (ref 70–99)
Potassium: 4.5 mmol/L (ref 3.5–5.1)
Sodium: 146 mmol/L — ABNORMAL HIGH (ref 135–145)
Total Bilirubin: 0.8 mg/dL (ref 0.3–1.2)
Total Protein: 8.8 g/dL — ABNORMAL HIGH (ref 6.5–8.1)

## 2019-07-21 LAB — SEDIMENTATION RATE: Sed Rate: 43 mm/hr — ABNORMAL HIGH (ref 0–20)

## 2019-07-21 LAB — HEPATITIS C ANTIBODY: HCV Ab: NONREACTIVE

## 2019-07-21 MED ORDER — TRIUMEQ 600-50-300 MG PO TABS: 1.0000 | ORAL_TABLET | Freq: Every day | ORAL | 6 refills | Status: AC

## 2019-07-21 NOTE — Progress Notes (Signed)
NAME: Danny Cook.  DOB: 1960/07/18  MRN: [id number]  Date/Time: 07/21/2019 10:21 AM   Subjective:  Follow up visit-  ?Danny Cook. is a 59 y.o. male with a history of HIV/DM/CHF/schizophrenia, anxiety Currently on Triumeq and is 100% adherent. Last viral load is undetectable and CD4 is  1038 from 12/30/2018.   He has no side effects from his medication. After his last visit in July 2020 with me  he has seen neurologist and started on Aricept Has back /leg pain- seen neurosurgeon- planning for decompressive minimally invasive surgery   HIV diagnosed 13s Nadir Cd4 <50 HAARt history Triumeq since 2016 Prior regimen- epzicom+ reyataz+norvir  Acquired thrusex with men Genotype-NK  Medical History CKD FSGS Hyperlipidemia Anemia- small M spike-  Pericardial effusion- resolved Left pleural effusion HTN Adrenal adenoma High Esr- much improved from > 140 to 35  Neg Autoimmune work up t-   Inactive problem Kaposi sarcoma ? ?   Past Surgical History:  Procedure Laterality Date  . CARDIAC CATHETERIZATION    . COLONOSCOPY WITH PROPOFOL N/A 09/03/2015   Procedure: COLONOSCOPY WITH PROPOFOL;  Surgeon: Lollie Sails, MD;  Location: [facility] ENDOSCOPY;  Service: Endoscopy;  Laterality: N/A;  . DG TEETH FULL    . ESOPHAGOGASTRODUODENOSCOPY (EGD) WITH PROPOFOL N/A 07/01/2016   Procedure: ESOPHAGOGASTRODUODENOSCOPY (EGD) WITH PROPOFOL;  Surgeon: Lollie Sails, MD;  Location: [facility] ENDOSCOPY;  Service: Endoscopy;  Laterality: N/A;    Social History   Socioeconomic History  . Marital status: Single    Spouse name: Not on file  . Number of children: Not on file  . Years of education: 56  . Highest education level: GED or equivalent  Occupational History  . Not on file  Tobacco Use  . Smoking status: Never Smoker  . Smokeless tobacco: Never Used  Substance and Sexual Activity  . Alcohol use: No  . Drug use: No  . Sexual activity: Never  Other  Topics Concern  . Not on file  Social History Narrative  . Not on file   Social Determinants of Health   Financial Resource Strain:   . Difficulty of Paying Living Expenses: Not on file  Food Insecurity:   . Worried About Charity fundraiser in the Last Year: Not on file  . Ran Out of Food in the Last Year: Not on file  Transportation Needs:   . Lack of Transportation (Medical): Not on file  . Lack of Transportation (Non-Medical): Not on file  Physical Activity:   . Days of Exercise per Week: Not on file  . Minutes of Exercise per Session: Not on file  Stress:   . Feeling of Stress : Not on file  Social Connections:   . Frequency of Communication with Friends and Family: Not on file  . Frequency of Social Gatherings with Friends and Family: Not on file  . Attends Religious Services: Not on file  . Active Member of Clubs or Organizations: Not on file  . Attends Archivist Meetings: Not on file  . Marital Status: Not on file  Intimate Partner Violence:   . Fear of Current or Ex-Partner: Not on file  . Emotionally Abused: Not on file  . Physically Abused: Not on file  . Sexually Abused: Not on file    Family History  Problem Relation Age of Onset  . Heart attack Mother   . Diabetes Mellitus II Mother   . Mental illness Mother   . CAD Mother   .  Heart attack Father   . CAD Father   . Hypertension Father    Allergies  Allergen Reactions  . Trazodone And Nefazodone     Affects other meds that they dont work  . Trazodone Hcl Other (See Comments)  . Viread [Tenofovir Disoproxil]     Damages kidneys  . Etodolac Rash   ? Current Outpatient Medications  Medication Sig Dispense Refill  . abacavir-dolutegravir-lamiVUDine (TRIUMEQ) 600-50-300 MG tablet Take 1 tablet by mouth daily. (Patient taking differently: Take 1 tablet by mouth daily. ) 30 tablet 3  . acetaminophen (TYLENOL) 325 MG tablet Take 2 tablets (650 mg total) by mouth every 6 (six) hours as needed for  fever (Fever >101). (Patient taking differently: Take 650 mg by mouth every 6 (six) hours as needed for fever (Fever >101). ) 10 tablet 0  . albuterol (PROVENTIL HFA;VENTOLIN HFA) 108 (90 Base) MCG/ACT inhaler Inhale 2 puffs into the lungs every 6 (six) hours as needed for wheezing or shortness of breath. (Patient taking differently: Inhale 2 puffs into the lungs every 6 (six) hours as needed for wheezing or shortness of breath. ) 1 Inhaler 2  . aspirin EC 325 MG tablet Take 325 mg by mouth daily.    . bumetanide (BUMEX) 1 MG tablet Take 1 mg by mouth every other day.    . butalbital-aspirin-caffeine (FIORINAL) 50-325-40 MG capsule Take 1-2 capsules by mouth every 6 (six) hours as needed for headache.     . cyanocobalamin 1000 MCG tablet Take 1,000 mcg by mouth daily.     . diazepam (VALIUM) 5 MG tablet Take 5 mg by mouth 2 (two) times daily as needed for anxiety.     . diphenhydrAMINE (BENADRYL) 50 MG capsule Take 1 capsule (50 mg total) by mouth at bedtime as needed for sleep. (Patient taking differently: Take 50 mg by mouth at bedtime as needed for sleep. ) 30 capsule 0  . divalproex (DEPAKOTE ER) 500 MG 24 hr tablet Take 500 mg by mouth at bedtime.     . donepezil (ARICEPT) 5 MG tablet Take by mouth.    [name] FANAPT 12 MG TABS Take 12 mg by mouth daily.     . feeding supplement, GLUCERNA SHAKE, (GLUCERNA SHAKE) LIQD Take 237 mLs by mouth daily.    . ferrous sulfate 325 (65 FE) MG tablet Take 325 mg by mouth daily with breakfast.    . fluticasone (FLONASE) 50 MCG/ACT nasal spray Place 2 sprays into both nostrils 2 (two) times daily as needed for allergies.     . fluticasone (FLOVENT HFA) 220 MCG/ACT inhaler Inhale 2 puffs into the lungs 2 (two) times daily. Rinse out mouth afterwards 1 Inhaler 12  . gabapentin (NEURONTIN) 300 MG capsule Take 300 mg by mouth 3 (three) times daily.    [name] gemfibrozil (LOPID) 600 MG tablet Take 600 mg by mouth 2 (two) times daily before a meal.    . glimepiride (AMARYL) 4  MG tablet Take 4 mg by mouth daily with breakfast.     . haloperidol (HALDOL) 5 MG tablet Take 5 mg by mouth 2 (two) times daily.     . Insulin Degludec (TRESIBA FLEXTOUCH) 200 UNIT/ML SOPN Inject 90 Units into the skin daily.     [name] losartan (COZAAR) 25 MG tablet Take 1 tablet (25 mg total) by mouth daily. 30 tablet 0  . memantine (NAMENDA) 10 MG tablet Take 10 mg by mouth 2 (two) times daily.    . metFORMIN (GLUCOPHAGE) 1000  MG tablet Take 1,000 mg by mouth 2 (two) times daily with a meal.    . metoprolol tartrate (LOPRESSOR) 25 MG tablet Take 0.5 tablets (12.5 mg total) by mouth 2 (two) times daily. 30 tablet 0  . mirtazapine (REMERON) 30 MG tablet Take 30 mg by mouth at bedtime.    . nitroGLYCERIN (NITROSTAT) 0.4 MG SL tablet Place 0.4 mg under the tongue every 5 (five) minutes as needed for chest pain.     [name] NOVOLOG FLEXPEN 100 UNIT/ML FlexPen Inject 6 Units into the skin 3 (three) times daily as needed for high blood sugar (blood sugar over 100).     . Omega-3 Fatty Acids (FISH OIL) 1000 MG CAPS Take 1,000 mg by mouth daily.    [name] omeprazole (PRILOSEC) 40 MG capsule Take 40 mg by mouth 2 (two) times daily.     [name] OZEMPIC, 0.25 OR 0.5 MG/DOSE, 2 MG/1.5ML SOPN Inject 0.5 mg into the skin once a week.    . promethazine (PHENERGAN) 6.25 MG/5ML syrup Take 6.25 mg by mouth every 6 (six) hours as needed for nausea or vomiting.     . rosuvastatin (CRESTOR) 40 MG tablet Take 40 mg by mouth daily.     . sucralfate (CARAFATE) 1 g tablet Take 1 g by mouth 4 (four) times daily.     [name] triamcinolone cream (KENALOG) 0.1 % Apply 1 application topically 2 (two) times daily as needed. (Patient taking differently: Apply 1 application topically 2 (two) times daily. ) 30 g 0  . venlafaxine XR (EFFEXOR-XR) 75 MG 24 hr capsule Take 1 capsule (75 mg total) by mouth daily with breakfast. (Patient taking differently: Take 75 mg by mouth daily with breakfast. ) 30 capsule 0   No current facility-administered medications  for this visit.    REVIEW OF SYSTEMS:  Const: negative fever, negative chills, negative weight loss Eyes: negative diplopia or visual changes, negative eye pain ENT: negative coryza, negative sore throat Resp: negative cough, hemoptysis, dyspnea Cards: negative for chest pain, palpitations, lower extremity edema GU: negative for frequency, dysuria and hematuria Skin: negative for rash and pruritus Heme: negative for easy bruising and gum/nose bleeding MS: , back pain and leg pain. muscle weakness Neurolo: memory problems  Psych: schizhoprenia Objective:  VITALS:  BP 131/86   Pulse 73   Temp 97.7 F (36.5 C)   Resp 16   Ht 5' 7"  (1.702 m)   Wt 219 lb (99.3 kg)   SpO2 97%   BMI 34.30 kg/m PHYSICAL EXAM:  General: Alert, cooperative, no distress, obese Head: Normocephalic, without obvious abnormality, atraumatic. Eyes: Conjunctivae clear, anicteric sclerae. Pupils are equal Nose: Nares normal. No drainage or sinus tenderness. edentulous Neck: Supple, symmetrical, no adenopathy, thyroid: non tender no carotid bruit and no JVD. Lungs: Clear to auscultation bilaterally. No Wheezing or Rhonchi. No rales. Heart: Regular rate and rhythm, no murmur, rub or gallop. Abdomen: Soft,  distended. Bowel sounds normal. No masses Extremities: Extremities normal, atraumatic, no cyanosis. No edema. No clubbing Lymph: Cervical, supraclavicular normal. Neurologic: Grossly non-focal Health maintenance Vaccination  Vaccine Date last given comment  Influenza 03/15/18   Hepatitis B    Hepatitis A    Prevnar-PCV-13 08/10/18   Pneumovac-PPSV-23 08/01/17   TdaP 08/10/18   HPV    Shingrix ( zoster vaccine)     ______________________  Labs Lab Result  Date comment  HIV VL <20 12/30/18   CD4 1030 ( 36) 12/30/18   Genotype     [id number]  HIV antibody     RPR NR 04/30/2018   Quantiferon Gold NEG 04/28/18   Hep C ab NR 04/03/2016   Hepatitis B-ab,ag,c Core ab positive 04/16/15   Hepatitis A-IgM,  IgG /T     Lipid 177/540/30/    GC/CHL     PAP     HB,PLT,Cr, LFT 13.1/166/1.2/N 12/06/18     Preventive  Procedure Result  Date comment  colonoscopy  March 2021        Dental exam edentulous    Opthal       Impression/Recommendation ? 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  Bone marrow(04/30/18) showed hypercellular bone marrow with 4% plasma cells negative for Congo red stain. Serum electrophoresis had a small M spike- unclear significance  DM-on liraglutide ( victoza) and Tresiba, metformin and glimepiride- saw his endocrinologist today  Hyperlipidemia on Rosuvastatin  Hypertriglyceridemia-  on Lopid  HTN -losartan, metoprolol  paranoid schizophrenia  He has received the first dose of the corona vaccine ? Follow-up 6 months ? ? Discussed with patient in detail

## 2019-07-21 NOTE — Patient Instructions (Signed)
You are here for follow up - today will do labs- continue triumeq- follow up 6 months

## 2019-07-22 LAB — T-HELPER CELLS CD4/CD8 %
% CD 4 Pos. Lymph.: 35.4 % (ref 30.8–58.5)
Absolute CD 4 Helper: 850 /uL (ref 359–1519)
Basophils Absolute: 0.1 10*3/uL (ref 0.0–0.2)
Basos: 1 %
CD3+CD4+ Cells/CD3+CD8+ Cells Bld: 0.97 (ref 0.92–3.72)
CD3+CD8+ Cells # Bld: 876 /uL (ref 109–897)
CD3+CD8+ Cells NFr Bld: 36.5 % — ABNORMAL HIGH (ref 12.0–35.5)
EOS (ABSOLUTE): 0.1 10*3/uL (ref 0.0–0.4)
Eos: 2 %
Hematocrit: 38.8 % (ref 37.5–51.0)
Hemoglobin: 13.8 g/dL (ref 13.0–17.7)
Immature Grans (Abs): 0 10*3/uL (ref 0.0–0.1)
Immature Granulocytes: 0 %
Lymphocytes Absolute: 2.4 10*3/uL (ref 0.7–3.1)
Lymphs: 39 %
MCH: 32.5 pg (ref 26.6–33.0)
MCHC: 35.6 g/dL (ref 31.5–35.7)
MCV: 92 fL (ref 79–97)
Monocytes Absolute: 0.5 10*3/uL (ref 0.1–0.9)
Monocytes: 9 %
Neutrophils Absolute: 2.9 10*3/uL (ref 1.4–7.0)
Neutrophils: 49 %
Platelets: 247 10*3/uL (ref 150–450)
RBC: 4.24 x10E6/uL (ref 4.14–5.80)
RDW: 13.4 % (ref 11.6–15.4)
WBC: 6 10*3/uL (ref 3.4–10.8)

## 2019-07-22 LAB — RPR: RPR Ser Ql: NONREACTIVE

## 2019-07-23 LAB — HIV-1 RNA QUANT-NO REFLEX-BLD
HIV 1 RNA Quant: 40 copies/mL
LOG10 HIV-1 RNA: 1.602 log10copy/mL

## 2019-07-24 LAB — QUANTIFERON-TB GOLD PLUS (RQFGPL)
QuantiFERON Mitogen Value: >10 IU/mL
QuantiFERON Nil Value: 0.14 IU/mL
QuantiFERON TB1 Ag Value: 0.23 IU/mL
QuantiFERON TB2 Ag Value: 0.21 IU/mL

## 2019-07-24 LAB — QUANTIFERON-TB GOLD PLUS: QuantiFERON-TB Gold Plus: NEGATIVE

## 2019-08-12 ENCOUNTER — Other Ambulatory Visit: Payer: Self-pay | Admitting: Neurosurgery

## 2019-08-14 ENCOUNTER — Emergency Department: Payer: Medicare Other

## 2019-08-14 ENCOUNTER — Other Ambulatory Visit: Payer: Self-pay

## 2019-08-14 ENCOUNTER — Inpatient Hospital Stay
Admission: EM | Admit: 2019-08-14 | Discharge: 2019-08-19 | DRG: 871 | Disposition: A | Discharge status: Home Health | Payer: Medicare Other | Attending: Internal Medicine | Admitting: Internal Medicine

## 2019-08-14 DIAGNOSIS — D649 Anemia, unspecified: Secondary | ICD-10-CM | POA: Diagnosis present

## 2019-08-14 DIAGNOSIS — Z818 Family history of other mental and behavioral disorders: Secondary | ICD-10-CM

## 2019-08-14 DIAGNOSIS — Z888 Allergy status to other drugs, medicaments and biological substances status: Secondary | ICD-10-CM

## 2019-08-14 DIAGNOSIS — R509 Fever, unspecified: Secondary | ICD-10-CM | POA: Diagnosis present

## 2019-08-14 DIAGNOSIS — N179 Acute kidney failure, unspecified: Secondary | ICD-10-CM | POA: Diagnosis present

## 2019-08-14 DIAGNOSIS — R42 Dizziness and giddiness: Secondary | ICD-10-CM | POA: Diagnosis present

## 2019-08-14 DIAGNOSIS — Z794 Long term (current) use of insulin: Secondary | ICD-10-CM

## 2019-08-14 DIAGNOSIS — Z8673 Personal history of transient ischemic attack (TIA), and cerebral infarction without residual deficits: Secondary | ICD-10-CM

## 2019-08-14 DIAGNOSIS — Z7982 Long term (current) use of aspirin: Secondary | ICD-10-CM

## 2019-08-14 DIAGNOSIS — E119 Type 2 diabetes mellitus without complications: Secondary | ICD-10-CM | POA: Diagnosis present

## 2019-08-14 DIAGNOSIS — A419 Sepsis, unspecified organism: Principal | ICD-10-CM | POA: Diagnosis present

## 2019-08-14 DIAGNOSIS — I959 Hypotension, unspecified: Secondary | ICD-10-CM | POA: Diagnosis present

## 2019-08-14 DIAGNOSIS — Z8249 Family history of ischemic heart disease and other diseases of the circulatory system: Secondary | ICD-10-CM

## 2019-08-14 DIAGNOSIS — E876 Hypokalemia: Secondary | ICD-10-CM | POA: Diagnosis not present

## 2019-08-14 DIAGNOSIS — F200 Paranoid schizophrenia: Secondary | ICD-10-CM | POA: Diagnosis present

## 2019-08-14 DIAGNOSIS — Z833 Family history of diabetes mellitus: Secondary | ICD-10-CM

## 2019-08-14 DIAGNOSIS — K219 Gastro-esophageal reflux disease without esophagitis: Secondary | ICD-10-CM | POA: Diagnosis present

## 2019-08-14 DIAGNOSIS — Z79891 Long term (current) use of opiate analgesic: Secondary | ICD-10-CM

## 2019-08-14 DIAGNOSIS — G4730 Sleep apnea, unspecified: Secondary | ICD-10-CM | POA: Diagnosis present

## 2019-08-14 DIAGNOSIS — F419 Anxiety disorder, unspecified: Secondary | ICD-10-CM | POA: Diagnosis present

## 2019-08-14 DIAGNOSIS — M5136 Other intervertebral disc degeneration, lumbar region: Secondary | ICD-10-CM | POA: Diagnosis present

## 2019-08-14 DIAGNOSIS — Z8589 Personal history of malignant neoplasm of other organs and systems: Secondary | ICD-10-CM

## 2019-08-14 DIAGNOSIS — Z20822 Contact with and (suspected) exposure to covid-19: Secondary | ICD-10-CM | POA: Diagnosis present

## 2019-08-14 DIAGNOSIS — Z79899 Other long term (current) drug therapy: Secondary | ICD-10-CM

## 2019-08-14 DIAGNOSIS — J189 Pneumonia, unspecified organism: Secondary | ICD-10-CM | POA: Diagnosis present

## 2019-08-14 DIAGNOSIS — I110 Hypertensive heart disease with heart failure: Secondary | ICD-10-CM | POA: Diagnosis present

## 2019-08-14 DIAGNOSIS — I252 Old myocardial infarction: Secondary | ICD-10-CM

## 2019-08-14 DIAGNOSIS — Z21 Asymptomatic human immunodeficiency virus [HIV] infection status: Secondary | ICD-10-CM | POA: Diagnosis present

## 2019-08-14 DIAGNOSIS — I10 Essential (primary) hypertension: Secondary | ICD-10-CM | POA: Diagnosis present

## 2019-08-14 DIAGNOSIS — I5032 Chronic diastolic (congestive) heart failure: Secondary | ICD-10-CM | POA: Diagnosis present

## 2019-08-14 LAB — URINALYSIS, COMPLETE (UACMP) WITH MICROSCOPIC
Bilirubin Urine: NEGATIVE
Glucose, UA: NEGATIVE mg/dL
Hgb urine dipstick: NEGATIVE
Ketones, ur: NEGATIVE mg/dL
Leukocytes,Ua: NEGATIVE
Nitrite: NEGATIVE
Protein, ur: 100 mg/dL — AB
Specific Gravity, Urine: 1.02 (ref 1.005–1.030)
pH: 6 (ref 5.0–8.0)

## 2019-08-14 LAB — CBC WITH DIFFERENTIAL/PLATELET
Abs Immature Granulocytes: 0.04 10*3/uL (ref 0.00–0.07)
Basophils Absolute: 0 10*3/uL (ref 0.0–0.1)
Basophils Relative: 0 %
Eosinophils Absolute: 0 10*3/uL (ref 0.0–0.5)
Eosinophils Relative: 0 %
HCT: 32.8 % — ABNORMAL LOW (ref 39.0–52.0)
Hemoglobin: 11.1 g/dL — ABNORMAL LOW (ref 13.0–17.0)
Immature Granulocytes: 0 %
Lymphocytes Relative: 14 %
Lymphs Abs: 1.5 10*3/uL (ref 0.7–4.0)
MCH: 31.1 pg (ref 26.0–34.0)
MCHC: 33.8 g/dL (ref 30.0–36.0)
MCV: 91.9 fL (ref 80.0–100.0)
Monocytes Absolute: 1 10*3/uL (ref 0.1–1.0)
Monocytes Relative: 9 %
Neutro Abs: 7.9 10*3/uL — ABNORMAL HIGH (ref 1.7–7.7)
Neutrophils Relative %: 77 %
Platelets: 190 10*3/uL (ref 150–400)
RBC: 3.57 MIL/uL — ABNORMAL LOW (ref 4.22–5.81)
RDW: 13.6 % (ref 11.5–15.5)
WBC: 10.4 10*3/uL (ref 4.0–10.5)
nRBC: 0 % (ref 0.0–0.2)

## 2019-08-14 LAB — COMPREHENSIVE METABOLIC PANEL
ALT: 11 U/L (ref 0–44)
AST: 15 U/L (ref 15–41)
Albumin: 3.4 g/dL — ABNORMAL LOW (ref 3.5–5.0)
Alkaline Phosphatase: 77 U/L (ref 38–126)
Anion gap: 9 (ref 5–15)
BUN: 14 mg/dL (ref 6–20)
CO2: 25 mmol/L (ref 22–32)
Calcium: 8.6 mg/dL — ABNORMAL LOW (ref 8.9–10.3)
Chloride: 107 mmol/L (ref 98–111)
Creatinine, Ser: 1.15 mg/dL (ref 0.61–1.24)
GFR calc Af Amer: >60 mL/min (ref >60)
GFR calc non Af Amer: >60 mL/min (ref >60)
Glucose, Bld: 78 mg/dL (ref 70–99)
Potassium: 3.1 mmol/L — ABNORMAL LOW (ref 3.5–5.1)
Sodium: 141 mmol/L (ref 135–145)
Total Bilirubin: 0.4 mg/dL (ref 0.3–1.2)
Total Protein: 6.7 g/dL (ref 6.5–8.1)

## 2019-08-14 LAB — POC SARS CORONAVIRUS 2 AG: SARS Coronavirus 2 Ag: NEGATIVE

## 2019-08-14 LAB — LACTIC ACID, PLASMA: Lactic Acid, Venous: 2.4 mmol/L (ref 0.5–1.9)

## 2019-08-14 LAB — PROTIME-INR
INR: 1.1 (ref 0.8–1.2)
Prothrombin Time: 13.6 seconds (ref 11.4–15.2)

## 2019-08-14 LAB — PROCALCITONIN: Procalcitonin: 0.1 ng/mL

## 2019-08-14 MED ORDER — SODIUM CHLORIDE 0.9 % IV SOLN
2.0000 g | Freq: Once | INTRAVENOUS | Status: AC
  Administered 2019-08-14: 2 g via INTRAVENOUS
  Filled 2019-08-14: qty 2

## 2019-08-14 MED ORDER — POTASSIUM CHLORIDE CRYS ER 20 MEQ PO TBCR
40.0000 meq | EXTENDED_RELEASE_TABLET | Freq: Once | ORAL | Status: AC
  Administered 2019-08-14: 40 meq via ORAL
  Filled 2019-08-14: qty 2

## 2019-08-14 MED ORDER — VANCOMYCIN HCL IN DEXTROSE 1-5 GM/200ML-% IV SOLN
1000.0000 mg | Freq: Once | INTRAVENOUS | Status: AC
  Administered 2019-08-15: 01:00:00 1000 mg via INTRAVENOUS
  Filled 2019-08-14: qty 200

## 2019-08-14 MED ORDER — ACETAMINOPHEN 325 MG PO TABS
650.0000 mg | ORAL_TABLET | Freq: Once | ORAL | Status: AC
  Administered 2019-08-14: 23:00:00 650 mg via ORAL
  Filled 2019-08-14: qty 2

## 2019-08-14 MED ORDER — METRONIDAZOLE IN NACL 5-0.79 MG/ML-% IV SOLN
500.0000 mg | Freq: Once | INTRAVENOUS | Status: AC
  Administered 2019-08-15: 500 mg via INTRAVENOUS
  Filled 2019-08-14: qty 100

## 2019-08-14 MED ORDER — SODIUM CHLORIDE 0.9 % IV SOLN
1000.0000 mL | Freq: Once | INTRAVENOUS | Status: AC
  Administered 2019-08-14: 22:00:00 1000 mL via INTRAVENOUS

## 2019-08-14 NOTE — ED Provider Notes (Signed)
[facility] Emergency Department Provider Note   ____________________________________________   First MD Initiated Contact with Patient 08/14/19 2307     (approximate)  I have reviewed the triage vital signs and the nursing notes.   HISTORY  Chief Complaint Fever and Code Sepsis    HPI Danny Cook. is a 59 y.o. male with past medical history of schizophrenia, HIV, CHF, CKD who presents to the ED for fever.  Patient reports they noticed a fever at the group home where he resides.  He has been feeling malaised and generally weak for the past couple of days and endorses a headache as well as diffuse muscle aches.  He endorses a cough, but denies any chest pain, shortness of breath, abdominal pain, vomiting, dysuria, or hematuria.  He does state that other people at his group home have been coughing frequently.  He arrives via EMS, who gave 500 cc of fluid and liquid Tylenol prior to arrival.        Past Medical History:  Diagnosis Date  . Anemia   . Anxiety   . Arthritis   . CHF (congestive heart failure) ([place])   . Chronic kidney disease    Renal Insufficiency Syndrome; Glomerulosclerosis 2013  . Depressed   . Diabetes mellitus without complication ([place])   . GERD (gastroesophageal reflux disease)   . High cholesterol   . HIV (human immunodeficiency virus infection) ([place])   . Hypertension   . 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 Paranoid disorder ([place])   . Pneumonia   . Schizophrenia, paranoid ([place])   . Sleep apnea   . TIA (transient ischemic attack)     Patient Active Problem List   Diagnosis Date Noted  . Sepsis ([place]) 08/15/2019  . NSTEMI (non-ST elevated myocardial infarction) ([place]) 06/07/2018  . Lymphedema 05/12/2018  . Pleural effusion   . FUO (fever of unknown origin)   . MGUS (monoclonal gammopathy of unknown significance)   . 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 Healthcare associated bacterial pneumonia  04/25/2018  . Community acquired pneumonia 04/12/2018  . Pneumonia 04/12/2018  . AKI (acute kidney injury) ([place]) 03/23/2018  . Pericarditis with effusion 03/23/2018  . Schizoaffective disorder, depressive type ([place]) 11/04/2017  . HIV positive ([place]) 11/04/2017  . Orthostatic dizziness 07/31/2017  . Chronic diastolic heart failure ([place]) 07/29/2017  . HTN (hypertension) 07/29/2017  . Diabetes ([place]) 07/29/2017  . Schizophrenia ([place]) 07/29/2017    Past Surgical History:  Procedure Laterality Date  . CARDIAC CATHETERIZATION    . COLONOSCOPY WITH PROPOFOL N/A 09/03/2015   Procedure: COLONOSCOPY WITH PROPOFOL;  Surgeon: Lollie Sails, MD;  Location: [facility] ENDOSCOPY;  Service: Endoscopy;  Laterality: N/A;  . DG TEETH FULL    . ESOPHAGOGASTRODUODENOSCOPY (EGD) WITH PROPOFOL N/A 07/01/2016   Procedure: ESOPHAGOGASTRODUODENOSCOPY (EGD) WITH PROPOFOL;  Surgeon: Lollie Sails, MD;  Location: [facility] ENDOSCOPY;  Service: Endoscopy;  Laterality: N/A;    Prior to Admission medications   Medication Sig Start Date End Date Taking? Authorizing Provider  abacavir-dolutegravir-lamiVUDine (TRIUMEQ) 600-50-300 MG tablet Take 1 tablet by mouth at bedtime. 07/21/19  Yes Tsosie Billing, MD  acetaminophen (TYLENOL) 325 MG tablet Take 2 tablets (650 mg total) by mouth every 6 (six) hours as needed for fever (Fever >101). 05/03/18  Yes Vaughan Basta, MD  albuterol (PROVENTIL HFA;VENTOLIN HFA) 108 (90 Base) MCG/ACT inhaler Inhale 2 puffs into the lungs every 6 (six) hours as needed for wheezing or shortness of breath. 04/14/18  Yes Spencer,  Richard, MD  aspirin EC 325 MG tablet Take 325 mg by mouth daily.   Yes [provider]  bumetanide (BUMEX) 1 MG tablet Take 1 mg by mouth every other day.   Yes [provider]  butalbital-aspirin-caffeine Acquanetta Chain) 50-325-40 MG capsule Take 1-2 capsules by mouth every 6 (six) hours as needed for headache.    Yes [provider]    cyanocobalamin 1000 MCG tablet Take 1,000 mcg by mouth daily.    Yes [provider]  diazepam (VALIUM) 5 MG tablet Take 5 mg by mouth 2 (two) times daily as needed for anxiety.    Yes [provider]  diphenhydrAMINE (BENADRYL) 50 MG capsule Take 1 capsule (50 mg total) by mouth at bedtime as needed for sleep. 11/09/17  Yes McNew, Tyson Babinski, MD  divalproex (DEPAKOTE ER) 500 MG 24 hr tablet Take 500 mg by mouth at bedtime.    Yes [provider]  donepezil (ARICEPT) 5 MG tablet Take 5 mg by mouth daily.  07/19/19  Yes [provider]  FANAPT 12 MG TABS Take 12 mg by mouth 2 (two) times daily.  11/14/16  Yes [provider]  feeding supplement, GLUCERNA SHAKE, (GLUCERNA SHAKE) LIQD Take 237 mLs by mouth daily.   Yes [provider]  ferrous sulfate 325 (65 FE) MG tablet Take 325 mg by mouth daily with breakfast.   Yes [provider]  fluticasone (FLONASE) 50 MCG/ACT nasal spray Place 2 sprays into both nostrils 2 (two) times daily as needed for allergies.    Yes [provider]  fluticasone (FLOVENT HFA) 220 MCG/ACT inhaler Inhale 2 puffs into the lungs 2 (two) times daily. Rinse out mouth afterwards 04/14/18  Yes Wieting, Richard, MD  gabapentin (NEURONTIN) 300 MG capsule Take 300 mg by mouth 3 (three) times daily.   Yes [provider]  gemfibrozil (LOPID) 600 MG tablet Take 600 mg by mouth 2 (two) times daily before a meal.   Yes [provider]  glimepiride (AMARYL) 4 MG tablet Take 4 mg by mouth daily with breakfast.  06/06/19  Yes [provider]  haloperidol (HALDOL) 5 MG tablet Take 5 mg by mouth 2 (two) times daily.    Yes [provider]  Insulin Degludec (TRESIBA FLEXTOUCH) 200 UNIT/ML SOPN Inject 90 Units into the skin daily.    Yes [provider]  losartan (COZAAR) 25 MG tablet Take 1 tablet (25 mg total) by mouth daily. 05/04/18  Yes Vaughan Basta, MD  memantine  (NAMENDA) 10 MG tablet Take 10 mg by mouth 2 (two) times daily.   Yes [provider]  metFORMIN (GLUCOPHAGE) 1000 MG tablet Take 1,000 mg by mouth 2 (two) times daily with a meal.   Yes [provider]  metoprolol tartrate (LOPRESSOR) 25 MG tablet Take 0.5 tablets (12.5 mg total) by mouth 2 (two) times daily. 05/03/18  Yes Vaughan Basta, MD  mirtazapine (REMERON) 30 MG tablet Take 30 mg by mouth at bedtime. 04/21/18  Yes [provider]  nitroGLYCERIN (NITROSTAT) 0.4 MG SL tablet Place 0.4 mg under the tongue every 5 (five) minutes as needed for chest pain.    Yes [provider]  NOVOLOG FLEXPEN 100 UNIT/ML FlexPen Inject 6 Units into the skin 3 (three) times daily as needed for high blood sugar (blood sugar over 100). Add sliding scale if sugar over 100; 200-250=2 units, 251-300=4 units, 301-350=6 units, 351-400= 8 units, over 400=10 units. 03/16/19  Yes [provider]  Omega-3 Fatty Acids (FISH OIL) 1000 MG CAPS Take 1,000 mg by mouth daily.   Yes [provider]  omeprazole (PRILOSEC) 40 MG capsule Take 40 mg by mouth 2 (two) times daily.    Yes [provider]  OZEMPIC, 0.25 OR 0.5 MG/DOSE, 2 MG/1.5ML SOPN Inject 0.5 mg into the skin once a week. 06/06/19  Yes [provider]  promethazine (PHENERGAN) 6.25 MG/5ML syrup Take 6.25 mg by mouth every 6 (six) hours as needed for nausea or vomiting.    Yes [provider]  rosuvastatin (CRESTOR) 40 MG tablet Take 40 mg by mouth daily.    Yes [provider]  sucralfate (CARAFATE) 1 g tablet Take 1 g by mouth 4 (four) times daily.  04/21/18  Yes [provider]  tamsulosin (FLOMAX) 0.4 MG CAPS capsule Take 0.4 mg by mouth daily. 08/02/19  Yes [provider]  triamcinolone cream (KENALOG) 0.1 % Apply 1 application topically 2 (two) times daily as needed. Patient taking differently: Apply 1 application topically 2 (two) times daily.  11/16/16   Yes Little, Traci M, PA-C  venlafaxine XR (EFFEXOR-XR) 75 MG 24 hr capsule Take 1 capsule (75 mg total) by mouth daily with breakfast. 11/09/17  Yes McNew, Tyson Babinski, MD    Allergies Trazodone and nefazodone, Trazodone hcl, Viread [tenofovir disoproxil], and Etodolac  Family History  Problem Relation Age of Onset  . Heart attack Mother   . Diabetes Mellitus II Mother   . Mental illness Mother   . CAD Mother   . Heart attack Father   . CAD Father   . Hypertension Father     Social History Social History   Tobacco Use  . Smoking status: Never Smoker  . Smokeless tobacco: Never Used  Substance Use Topics  . Alcohol use: No  . Drug use: No    Review of Systems  Constitutional: Positive for fever/chills.  Positive for generalized weakness and malaise. Eyes: No visual changes. ENT: No sore throat. Cardiovascular: Denies chest pain. Respiratory: Denies shortness of breath.  Positive for cough. Gastrointestinal: No abdominal pain.  No nausea, no vomiting.  No diarrhea.  No constipation. Genitourinary: Negative for dysuria. Musculoskeletal: Negative for back pain.  Positive for myalgias. Skin: Negative for rash. Neurological: Negative for headaches, focal weakness or numbness.  ____________________________________________   PHYSICAL EXAM:  VITAL SIGNS: ED Triage Vitals [08/14/19 2207]  Enc Vitals Group     BP 137/62     Pulse Rate 80     Resp (!) 28     Temp (!) 105 F (40.6 C)     Temp Source Oral     SpO2 95 %     Weight 216 lb (98 kg)     Height 5\' 7"  (1.702 m)     Head Circumference      Peak Flow      Pain Score 0     Pain Loc      Pain Edu?      Excl. in [place]?     Constitutional: Alert and oriented. Eyes: Conjunctivae are normal. Head: Atraumatic. Nose: No congestion/rhinnorhea. Mouth/Throat: Mucous membranes are moist. Neck: Normal ROM Cardiovascular: Normal rate, regular rhythm. Grossly normal heart sounds. Respiratory: Normal respiratory effort.   No retractions. Lungs CTAB. Gastrointestinal: Soft and nontender. No distention. Genitourinary: deferred Musculoskeletal: No lower extremity tenderness nor edema. Neurologic:  Normal speech and language. No gross focal neurologic deficits are appreciated. Skin:  Skin is warm, dry and intact. No rash  noted. Psychiatric: Mood and affect are normal. Speech and behavior are normal.  ____________________________________________   LABS (all labs ordered are listed, but only abnormal results are displayed)  Labs Reviewed  COMPREHENSIVE METABOLIC PANEL - Abnormal; Notable for the following components:      Result Value   Potassium 3.1 (*)    Calcium 8.6 (*)    Albumin 3.4 (*)    All other components within normal limits  LACTIC ACID, PLASMA - Abnormal; Notable for the following components:   Lactic Acid, Venous 2.4 (*)    All other components within normal limits  CBC WITH DIFFERENTIAL/PLATELET - Abnormal; Notable for the following components:   RBC 3.57 (*)    Hemoglobin 11.1 (*)    HCT 32.8 (*)    Neutro Abs 7.9 (*)    All other components within normal limits  URINALYSIS, COMPLETE (UACMP) WITH MICROSCOPIC - Abnormal; Notable for the following components:   Color, Urine YELLOW (*)    APPearance CLEAR (*)    Protein, ur 100 (*)    Bacteria, UA RARE (*)    All other components within normal limits  COMPREHENSIVE METABOLIC PANEL - Abnormal; Notable for the following components:   Potassium 3.4 (*)    Glucose, Bld 116 (*)    Calcium 8.3 (*)    Albumin 3.4 (*)    All other components within normal limits  CBC - Abnormal; Notable for the following components:   WBC 12.4 (*)    RBC 3.74 (*)    Hemoglobin 11.8 (*)    HCT 35.6 (*)    All other components within normal limits  GLUCOSE, CAPILLARY - Abnormal; Notable for the following components:   Glucose-Capillary 29 (*)    All other components within normal limits  GLUCOSE, CAPILLARY - Abnormal; Notable for the following  components:   Glucose-Capillary 161 (*)    All other components within normal limits  GLUCOSE, CAPILLARY - Abnormal; Notable for the following components:   Glucose-Capillary 137 (*)    All other components within normal limits  GLUCOSE, CAPILLARY - Abnormal; Notable for the following components:   Glucose-Capillary 69 (*)    All other components within normal limits  GLUCOSE, CAPILLARY - Abnormal; Notable for the following components:   Glucose-Capillary 112 (*)    All other components within normal limits  GLUCOSE, CAPILLARY - Abnormal; Notable for the following components:   Glucose-Capillary 106 (*)    All other components within normal limits  RESPIRATORY PANEL BY RT PCR (FLU A&B, COVID)  CULTURE, BLOOD (ROUTINE X 2)  CULTURE, BLOOD (ROUTINE X 2)  URINE CULTURE  LACTIC ACID, PLASMA  PROTIME-INR  PROCALCITONIN  PROTIME-INR  HEMOGLOBIN A1C  GLUCOSE, CAPILLARY  CORTISOL-AM, BLOOD  PROCALCITONIN  POC SARS CORONAVIRUS 2 AG -  ED  POC SARS CORONAVIRUS 2 AG   ____________________________________________  EKG  ED ECG REPORT I, Blake Divine, the attending physician, personally viewed and interpreted this ECG.   Date: 08/15/2019  EKG Time: 00:02  Rate: 68  Rhythm: normal sinus rhythm  Axis: Normal  Intervals:none  ST&T Change: None   PROCEDURES  Procedure(s) performed (including Critical Care):  .Critical Care Performed by: Blake Divine, MD Authorized by: Blake Divine, MD   Critical care provider statement:    Critical care time (minutes):  45   Critical care time was exclusive of:  Separately billable procedures and treating other patients and teaching time   Critical care was necessary to treat or prevent imminent or life-threatening deterioration  of the following conditions:  Sepsis   Critical care was time spent personally by me on the following activities:  Discussions with consultants, evaluation of patient's response to treatment, examination of  patient, ordering and performing treatments and interventions, ordering and review of laboratory studies, ordering and review of radiographic studies, pulse oximetry, re-evaluation of patient's condition, obtaining history from patient or surrogate and review of old charts   I assumed direction of critical care for this patient from another provider in my specialty: no       ____________________________________________   INITIAL IMPRESSION / ASSESSMENT AND PLAN / ED COURSE       59 year old male with history of HIV and schizophrenia presents to the ED for fever, malaise, generalized weakness, and muscle aches over the past couple of days.  He is noted to have a temp of 105 upon arrival and along with tachypnea this is concerning for sepsis.  Symptoms sound most consistent with viral illness and there is no apparent bacterial infection on chest x-ray or UA.  He has no abdominal tenderness, remainder of labs are unremarkable.  Doubt meningitis as he has full range of motion of his neck without meningismus.  Given his immunocompromise status and concern for sepsis, he was started on broad-spectrum antibiotics and we will admit for further evaluation.      ____________________________________________   FINAL CLINICAL IMPRESSION(S) / ED DIAGNOSES  Final diagnoses:  Sepsis without acute organ dysfunction, due to unspecified organism [facility])     ED Discharge Orders    None       Note:  This document was prepared using Dragon voice recognition software and may include unintentional dictation errors.   Blake Divine, MD 08/15/19 [phone]

## 2019-08-14 NOTE — Progress Notes (Signed)
PHARMACY -  BRIEF ANTIBIOTIC NOTE   Pharmacy has received consult(s) for Vancomycin and Cefepime from an ED provider.  The patient's profile has been reviewed for ht/wt/allergies/indication/available labs.    One time order(s) placed for Vancomycin and Cefepime by ED provider  Further antibiotics/pharmacy consults should be ordered by admitting physician if indicated.                       Thank you, Vira Blanco 08/14/2019  11:26 PM

## 2019-08-14 NOTE — ED Triage Notes (Signed)
Pt from group home with fever. Pt received second covid shot last week. Pt states has not felt well for several days. Pt with history of hiv, schizophrenia. Ems gave 588mL ns and 640mg  liquid tylenol. Pt with cough.

## 2019-08-14 NOTE — ED Notes (Signed)
Critical lactic acid of 2.4 called from lab. Dr. Charna Archer notified, orders previously received.

## 2019-08-15 ENCOUNTER — Encounter: Payer: Self-pay | Admitting: Internal Medicine

## 2019-08-15 ENCOUNTER — Inpatient Hospital Stay: Payer: Medicare Other

## 2019-08-15 DIAGNOSIS — J181 Lobar pneumonia, unspecified organism: Secondary | ICD-10-CM | POA: Diagnosis not present

## 2019-08-15 DIAGNOSIS — N179 Acute kidney failure, unspecified: Secondary | ICD-10-CM | POA: Diagnosis present

## 2019-08-15 DIAGNOSIS — M5136 Other intervertebral disc degeneration, lumbar region: Secondary | ICD-10-CM | POA: Diagnosis present

## 2019-08-15 DIAGNOSIS — I252 Old myocardial infarction: Secondary | ICD-10-CM | POA: Diagnosis not present

## 2019-08-15 DIAGNOSIS — F419 Anxiety disorder, unspecified: Secondary | ICD-10-CM | POA: Diagnosis present

## 2019-08-15 DIAGNOSIS — A419 Sepsis, unspecified organism: Secondary | ICD-10-CM | POA: Diagnosis not present

## 2019-08-15 DIAGNOSIS — Z8589 Personal history of malignant neoplasm of other organs and systems: Secondary | ICD-10-CM | POA: Diagnosis not present

## 2019-08-15 DIAGNOSIS — Z79891 Long term (current) use of opiate analgesic: Secondary | ICD-10-CM | POA: Diagnosis not present

## 2019-08-15 DIAGNOSIS — Z7982 Long term (current) use of aspirin: Secondary | ICD-10-CM | POA: Diagnosis not present

## 2019-08-15 DIAGNOSIS — Z8249 Family history of ischemic heart disease and other diseases of the circulatory system: Secondary | ICD-10-CM | POA: Diagnosis not present

## 2019-08-15 DIAGNOSIS — Z20822 Contact with and (suspected) exposure to covid-19: Secondary | ICD-10-CM | POA: Diagnosis present

## 2019-08-15 DIAGNOSIS — R42 Dizziness and giddiness: Secondary | ICD-10-CM | POA: Diagnosis present

## 2019-08-15 DIAGNOSIS — I110 Hypertensive heart disease with heart failure: Secondary | ICD-10-CM | POA: Diagnosis present

## 2019-08-15 DIAGNOSIS — I959 Hypotension, unspecified: Secondary | ICD-10-CM | POA: Diagnosis present

## 2019-08-15 DIAGNOSIS — E119 Type 2 diabetes mellitus without complications: Secondary | ICD-10-CM | POA: Diagnosis present

## 2019-08-15 DIAGNOSIS — G4730 Sleep apnea, unspecified: Secondary | ICD-10-CM | POA: Diagnosis present

## 2019-08-15 DIAGNOSIS — I5032 Chronic diastolic (congestive) heart failure: Secondary | ICD-10-CM | POA: Diagnosis not present

## 2019-08-15 DIAGNOSIS — F200 Paranoid schizophrenia: Secondary | ICD-10-CM | POA: Diagnosis present

## 2019-08-15 DIAGNOSIS — I10 Essential (primary) hypertension: Secondary | ICD-10-CM | POA: Diagnosis not present

## 2019-08-15 DIAGNOSIS — J189 Pneumonia, unspecified organism: Secondary | ICD-10-CM | POA: Diagnosis not present

## 2019-08-15 DIAGNOSIS — Z8673 Personal history of transient ischemic attack (TIA), and cerebral infarction without residual deficits: Secondary | ICD-10-CM | POA: Diagnosis not present

## 2019-08-15 DIAGNOSIS — Z79899 Other long term (current) drug therapy: Secondary | ICD-10-CM | POA: Diagnosis not present

## 2019-08-15 DIAGNOSIS — Z21 Asymptomatic human immunodeficiency virus [HIV] infection status: Secondary | ICD-10-CM | POA: Diagnosis not present

## 2019-08-15 DIAGNOSIS — D649 Anemia, unspecified: Secondary | ICD-10-CM | POA: Diagnosis present

## 2019-08-15 DIAGNOSIS — Z794 Long term (current) use of insulin: Secondary | ICD-10-CM | POA: Diagnosis not present

## 2019-08-15 DIAGNOSIS — K219 Gastro-esophageal reflux disease without esophagitis: Secondary | ICD-10-CM | POA: Diagnosis present

## 2019-08-15 LAB — COMPREHENSIVE METABOLIC PANEL
ALT: 10 U/L (ref 0–44)
AST: 18 U/L (ref 15–41)
Albumin: 3.4 g/dL — ABNORMAL LOW (ref 3.5–5.0)
Alkaline Phosphatase: 76 U/L (ref 38–126)
Anion gap: 8 (ref 5–15)
BUN: 13 mg/dL (ref 6–20)
CO2: 25 mmol/L (ref 22–32)
Calcium: 8.3 mg/dL — ABNORMAL LOW (ref 8.9–10.3)
Chloride: 110 mmol/L (ref 98–111)
Creatinine, Ser: 1.11 mg/dL (ref 0.61–1.24)
GFR calc Af Amer: >60 mL/min (ref >60)
GFR calc non Af Amer: >60 mL/min (ref >60)
Glucose, Bld: 116 mg/dL — ABNORMAL HIGH (ref 70–99)
Potassium: 3.4 mmol/L — ABNORMAL LOW (ref 3.5–5.1)
Sodium: 143 mmol/L (ref 135–145)
Total Bilirubin: 0.6 mg/dL (ref 0.3–1.2)
Total Protein: 6.7 g/dL (ref 6.5–8.1)

## 2019-08-15 LAB — GLUCOSE, CAPILLARY
Glucose-Capillary: 137 mg/dL — ABNORMAL HIGH (ref 70–99)
Glucose-Capillary: 161 mg/dL — ABNORMAL HIGH (ref 70–99)
Glucose-Capillary: 29 mg/dL — CL (ref 70–99)
Glucose-Capillary: 72 mg/dL (ref 70–99)
Glucose-Capillary: 69 mg/dL — ABNORMAL LOW (ref 70–99)
Glucose-Capillary: 106 mg/dL — ABNORMAL HIGH (ref 70–99)
Glucose-Capillary: 112 mg/dL — ABNORMAL HIGH (ref 70–99)
Glucose-Capillary: 69 mg/dL — ABNORMAL LOW (ref 70–99)
Glucose-Capillary: 87 mg/dL (ref 70–99)
Glucose-Capillary: 55 mg/dL — ABNORMAL LOW (ref 70–99)
Glucose-Capillary: 62 mg/dL — ABNORMAL LOW (ref 70–99)
Glucose-Capillary: 83 mg/dL (ref 70–99)
Glucose-Capillary: 94 mg/dL (ref 70–99)
Glucose-Capillary: 90 mg/dL (ref 70–99)

## 2019-08-15 LAB — URINE CULTURE: Culture: <10000 — AB

## 2019-08-15 LAB — CBC
HCT: 35.6 % — ABNORMAL LOW (ref 39.0–52.0)
Hemoglobin: 11.8 g/dL — ABNORMAL LOW (ref 13.0–17.0)
MCH: 31.6 pg (ref 26.0–34.0)
MCHC: 33.1 g/dL (ref 30.0–36.0)
MCV: 95.2 fL (ref 80.0–100.0)
Platelets: 180 10*3/uL (ref 150–400)
RBC: 3.74 MIL/uL — ABNORMAL LOW (ref 4.22–5.81)
RDW: 13.9 % (ref 11.5–15.5)
WBC: 12.4 10*3/uL — ABNORMAL HIGH (ref 4.0–10.5)
nRBC: 0 % (ref 0.0–0.2)

## 2019-08-15 LAB — CORTISOL-AM, BLOOD: Cortisol - AM: 10.6 ug/dL (ref 6.7–22.6)

## 2019-08-15 LAB — PROTIME-INR
INR: 1 (ref 0.8–1.2)
Prothrombin Time: 13.4 seconds (ref 11.4–15.2)

## 2019-08-15 LAB — HEMOGLOBIN A1C
Hgb A1c MFr Bld: 5.6 % (ref 4.8–5.6)
Mean Plasma Glucose: 114.02 mg/dL

## 2019-08-15 LAB — LACTIC ACID, PLASMA: Lactic Acid, Venous: 1.7 mmol/L (ref 0.5–1.9)

## 2019-08-15 LAB — RESPIRATORY PANEL BY RT PCR (FLU A&B, COVID)
Influenza A by PCR: NEGATIVE
Influenza B by PCR: NEGATIVE
SARS Coronavirus 2 by RT PCR: NEGATIVE

## 2019-08-15 LAB — PROCALCITONIN: Procalcitonin: <0.1 ng/mL

## 2019-08-15 MED ORDER — IPRATROPIUM-ALBUTEROL 20-100 MCG/ACT IN AERS: 1.0000 | INHALATION_SPRAY | Freq: Four times a day (QID) | RESPIRATORY_TRACT | Status: DC | PRN

## 2019-08-15 MED ORDER — HALOPERIDOL 5 MG PO TABS
5.0000 mg | ORAL_TABLET | Freq: Two times a day (BID) | ORAL | Status: DC
  Administered 2019-08-15 – 2019-08-19 (×9): 5 mg via ORAL
  Filled 2019-08-15 (×11): qty 1

## 2019-08-15 MED ORDER — LOSARTAN POTASSIUM 25 MG PO TABS
25.0000 mg | ORAL_TABLET | Freq: Every day | ORAL | Status: DC
  Administered 2019-08-15 – 2019-08-19 (×5): 25 mg via ORAL
  Filled 2019-08-15 (×5): qty 1

## 2019-08-15 MED ORDER — FERROUS SULFATE 325 (65 FE) MG PO TABS
325.0000 mg | ORAL_TABLET | Freq: Every day | ORAL | Status: DC
  Administered 2019-08-16 – 2019-08-19 (×4): 325 mg via ORAL
  Filled 2019-08-15 (×4): qty 1

## 2019-08-15 MED ORDER — IPRATROPIUM-ALBUTEROL 0.5-2.5 (3) MG/3ML IN SOLN: 3.0000 mL | RESPIRATORY_TRACT | Status: DC | PRN

## 2019-08-15 MED ORDER — POTASSIUM CHLORIDE CRYS ER 20 MEQ PO TBCR
20.0000 meq | EXTENDED_RELEASE_TABLET | Freq: Once | ORAL | Status: AC
  Administered 2019-08-15: 17:00:00 20 meq via ORAL
  Filled 2019-08-15: qty 1

## 2019-08-15 MED ORDER — SODIUM CHLORIDE 0.9 % IV BOLUS (SEPSIS)
1000.0000 mL | Freq: Once | INTRAVENOUS | Status: DC
  Administered 2019-08-15: 01:00:00 1000 mL via INTRAVENOUS

## 2019-08-15 MED ORDER — ENOXAPARIN SODIUM 40 MG/0.4ML ~~LOC~~ SOLN
40.0000 mg | SUBCUTANEOUS | Status: DC
  Administered 2019-08-15 – 2019-08-19 (×5): 40 mg via SUBCUTANEOUS
  Filled 2019-08-15 (×5): qty 0.4

## 2019-08-15 MED ORDER — ACETAMINOPHEN 325 MG PO TABS
650.0000 mg | ORAL_TABLET | Freq: Four times a day (QID) | ORAL | Status: DC | PRN
  Administered 2019-08-15: 07:00:00 650 mg via ORAL
  Filled 2019-08-15: qty 2

## 2019-08-15 MED ORDER — METRONIDAZOLE IN NACL 5-0.79 MG/ML-% IV SOLN
500.0000 mg | Freq: Three times a day (TID) | INTRAVENOUS | Status: DC
  Administered 2019-08-15 – 2019-08-16 (×4): 500 mg via INTRAVENOUS
  Filled 2019-08-15 (×7): qty 100

## 2019-08-15 MED ORDER — DONEPEZIL HCL 5 MG PO TABS
5.0000 mg | ORAL_TABLET | Freq: Every day | ORAL | Status: DC
  Administered 2019-08-15 – 2019-08-18 (×4): 5 mg via ORAL
  Filled 2019-08-15 (×4): qty 1

## 2019-08-15 MED ORDER — ACETAMINOPHEN 650 MG RE SUPP: 650.0000 mg | Freq: Four times a day (QID) | RECTAL | Status: DC | PRN

## 2019-08-15 MED ORDER — VANCOMYCIN HCL 2000 MG/400ML IV SOLN
2000.0000 mg | INTRAVENOUS | Status: DC
  Administered 2019-08-15: 11:00:00 2000 mg via INTRAVENOUS
  Filled 2019-08-15 (×2): qty 400

## 2019-08-15 MED ORDER — VENLAFAXINE HCL ER 75 MG PO CP24
75.0000 mg | ORAL_CAPSULE | Freq: Every day | ORAL | Status: DC
  Administered 2019-08-15 – 2019-08-19 (×5): 75 mg via ORAL
  Filled 2019-08-15 (×5): qty 1

## 2019-08-15 MED ORDER — DEXTROSE 10 % IV SOLN: INTRAVENOUS | Status: DC

## 2019-08-15 MED ORDER — PROMETHAZINE HCL 25 MG PO TABS
12.5000 mg | ORAL_TABLET | Freq: Four times a day (QID) | ORAL | Status: DC | PRN
  Administered 2019-08-18: 21:00:00 12.5 mg via ORAL
  Filled 2019-08-15 (×2): qty 1

## 2019-08-15 MED ORDER — ABACAVIR-DOLUTEGRAVIR-LAMIVUD 600-50-300 MG PO TABS
1.0000 | ORAL_TABLET | Freq: Every day | ORAL | Status: DC
  Administered 2019-08-15: 09:00:00 1 via ORAL
  Filled 2019-08-15: qty 1

## 2019-08-15 MED ORDER — INSULIN ASPART 100 UNIT/ML ~~LOC~~ SOLN
0.0000 [IU] | Freq: Three times a day (TID) | SUBCUTANEOUS | Status: DC
  Administered 2019-08-17 – 2019-08-18 (×3): 2 [IU] via SUBCUTANEOUS
  Filled 2019-08-15 (×3): qty 1

## 2019-08-15 MED ORDER — GABAPENTIN 300 MG PO CAPS
300.0000 mg | ORAL_CAPSULE | Freq: Three times a day (TID) | ORAL | Status: DC
  Administered 2019-08-15 – 2019-08-19 (×12): 300 mg via ORAL
  Filled 2019-08-15 (×6): qty 1
  Filled 2019-08-15: qty 3
  Filled 2019-08-15 (×5): qty 1

## 2019-08-15 MED ORDER — GEMFIBROZIL 600 MG PO TABS
600.0000 mg | ORAL_TABLET | Freq: Two times a day (BID) | ORAL | Status: DC
  Administered 2019-08-15 – 2019-08-19 (×8): 600 mg via ORAL
  Filled 2019-08-15 (×9): qty 1

## 2019-08-15 MED ORDER — POTASSIUM CHLORIDE 10 MEQ/50ML IV SOLN: 10.0000 meq | INTRAVENOUS | Status: DC

## 2019-08-15 MED ORDER — LACTATED RINGERS IV BOLUS
1000.0000 mL | Freq: Once | INTRAVENOUS | Status: AC
  Administered 2019-08-15: 1000 mL via INTRAVENOUS

## 2019-08-15 MED ORDER — DIVALPROEX SODIUM ER 500 MG PO TB24
500.0000 mg | ORAL_TABLET | Freq: Every day | ORAL | Status: DC
  Administered 2019-08-15 – 2019-08-19 (×5): 500 mg via ORAL
  Filled 2019-08-15 (×5): qty 1

## 2019-08-15 MED ORDER — IPRATROPIUM-ALBUTEROL 0.5-2.5 (3) MG/3ML IN SOLN
3.0000 mL | Freq: Four times a day (QID) | RESPIRATORY_TRACT | Status: DC | PRN
  Administered 2019-08-15: 14:00:00 3 mL via RESPIRATORY_TRACT

## 2019-08-15 MED ORDER — DEXTROSE 50 % IV SOLN
1.0000 | Freq: Once | INTRAVENOUS | Status: AC
  Administered 2019-08-15: 01:00:00 50 mL via INTRAVENOUS

## 2019-08-15 MED ORDER — MEMANTINE HCL 5 MG PO TABS
10.0000 mg | ORAL_TABLET | Freq: Two times a day (BID) | ORAL | Status: DC
  Administered 2019-08-15 – 2019-08-19 (×9): 10 mg via ORAL
  Filled 2019-08-15 (×9): qty 2

## 2019-08-15 MED ORDER — SODIUM CHLORIDE 0.9 % IV SOLN
2.0000 g | Freq: Three times a day (TID) | INTRAVENOUS | Status: DC
  Administered 2019-08-15 – 2019-08-19 (×12): 2 g via INTRAVENOUS
  Filled 2019-08-15 (×16): qty 2

## 2019-08-15 MED ORDER — DEXTROSE 50 % IV SOLN
INTRAVENOUS | Status: AC
  Filled 2019-08-15: qty 50

## 2019-08-15 MED ORDER — ASPIRIN 325 MG PO TABS
325.0000 mg | ORAL_TABLET | Freq: Every day | ORAL | Status: DC
  Administered 2019-08-15 – 2019-08-19 (×5): 325 mg via ORAL
  Filled 2019-08-15 (×5): qty 1

## 2019-08-15 MED ORDER — ABACAVIR-DOLUTEGRAVIR-LAMIVUD 600-50-300 MG PO TABS
1.0000 | ORAL_TABLET | Freq: Every day | ORAL | Status: DC
  Administered 2019-08-16 – 2019-08-18 (×3): 1 via ORAL
  Filled 2019-08-15 (×4): qty 1

## 2019-08-15 MED ORDER — METOPROLOL TARTRATE 25 MG PO TABS
12.5000 mg | ORAL_TABLET | Freq: Two times a day (BID) | ORAL | Status: DC
  Administered 2019-08-15 – 2019-08-19 (×9): 12.5 mg via ORAL
  Filled 2019-08-15 (×9): qty 1

## 2019-08-15 MED ORDER — INSULIN ASPART 100 UNIT/ML ~~LOC~~ SOLN: 0.0000 [IU] | Freq: Every day | SUBCUTANEOUS | Status: DC

## 2019-08-15 MED ORDER — SODIUM CHLORIDE 0.9 % IV SOLN: INTRAVENOUS | Status: DC

## 2019-08-15 NOTE — Progress Notes (Signed)
CODE SEPSIS - PHARMACY COMMUNICATION  **Broad Spectrum Antibiotics should be administered within 1 hour of Sepsis diagnosis**  Time Code Sepsis Called/Page Received: 23:15  Antibiotics Ordered: Cefepime, Vancomycin, Metronidazole  Time of 1st antibiotic administration: Cefepime given at 23:57  Additional action taken by pharmacy: N/A  If necessary, Name of Provider/Nurse Contacted: N/A    Vira Blanco ,PharmD Clinical Pharmacist  08/15/2019  12:28 AM

## 2019-08-15 NOTE — H&P (Signed)
History and Physical    Danny Cook. ON:[id number] DOB: 1960-11-03 DOA: 08/14/2019  PCP: Tracie Harrier, MD   Patient coming from:group home I have personally briefly reviewed patient's old medical records in [place]  Chief Complaint: fever and malaise HPI: Danny Cook. is a 59 y.o. male with medical history significant for HIV, schizophrenia, diabetes, chronic diastolic heart failure, presented to the emergency room with a 2-day history of muscle aches, generalized malaise and 1 day history of fever.  Patient received a second Covid shot a week ago.  EMS reported a temperature of 105 for which he got Tylenol.  He has a mild cough.  Denies shortness of breath.  Patient denies headache, denies visual disturbance, denies neck pain.  He does complain of low back pain which she has had for several years stating that he is due for back surgery in March.  He last had an epidural shot 6 months ago. Denies chest pain.  Denies abdominal pain, nausea vomiting, dysuria or change in bowel habits.  Review of patient's records reveals that he has been diagnosed with FUO in the past.  Was hospitalized in November 2019 for FUO and was seen by ID and heme-onc and his fever was thought related more to a steroid responsive autoimmune process rather than sepsis  ED Course: On arrival in the emergency room temperature was initially 105, improving to 100 with Tylenol that was administered.  His blood pressure was initially 137/62, HR 88, RR 28 with O2 sat 95% on room air.  After 2 hours in the emergency room BP was down to 100/54.  On his blood work, white cell count was 10.4, hemoglobin 11.1.  Lactic acid 2.4, pro calcitonin less than 0.01.  Chest x-ray showed no acute disease.  UrinAnalysis unremarkable.  Covid antigen test negative Review of Systems: As per HPI otherwise 10 point review of systems negative.    Past Medical History:  Diagnosis Date  . Anemia   . Anxiety   .  Arthritis   . CHF (congestive heart failure) ([place])   . Chronic kidney disease    Renal Insufficiency Syndrome; Glomerulosclerosis 2013  . Depressed   . Diabetes mellitus without complication ([place])   . GERD (gastroesophageal reflux disease)   . High cholesterol   . HIV (human immunodeficiency virus infection) ([place])   . Hypertension   . 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 Paranoid disorder ([place])   . Pneumonia   . Schizophrenia, paranoid ([place])   . Sleep apnea   . TIA (transient ischemic attack)     Past Surgical History:  Procedure Laterality Date  . CARDIAC CATHETERIZATION    . COLONOSCOPY WITH PROPOFOL N/A 09/03/2015   Procedure: COLONOSCOPY WITH PROPOFOL;  Surgeon: Lollie Sails, MD;  Location: [facility] ENDOSCOPY;  Service: Endoscopy;  Laterality: N/A;  . DG TEETH FULL    . ESOPHAGOGASTRODUODENOSCOPY (EGD) WITH PROPOFOL N/A 07/01/2016   Procedure: ESOPHAGOGASTRODUODENOSCOPY (EGD) WITH PROPOFOL;  Surgeon: Lollie Sails, MD;  Location: [facility] ENDOSCOPY;  Service: Endoscopy;  Laterality: N/A;     reports that he has never smoked. He has never used smokeless tobacco. He reports that he does not drink alcohol or use drugs.  Allergies  Allergen Reactions  . Trazodone And Nefazodone     Affects other meds that they dont work  . Trazodone Hcl Other (See Comments)  . Viread [Tenofovir Disoproxil]     Damages kidneys  . Etodolac Rash    Family History  Problem Relation Age of Onset  . Heart attack Mother   . Diabetes Mellitus II Mother   . Mental illness Mother   . CAD Mother   . Heart attack Father   . CAD Father   . Hypertension Father      Prior to Admission medications   Medication Sig Start Date End Date Taking? Authorizing Provider  abacavir-dolutegravir-lamiVUDine (TRIUMEQ) 600-50-300 MG tablet Take 1 tablet by mouth at bedtime. 07/21/19  Yes Tsosie Billing, MD  acetaminophen (TYLENOL) 325 MG tablet Take 2 tablets (650 mg total) by mouth every 6 (six) hours as needed  for fever (Fever >101). 05/03/18  Yes Vaughan Basta, MD  albuterol (PROVENTIL HFA;VENTOLIN HFA) 108 (90 Base) MCG/ACT inhaler Inhale 2 puffs into the lungs every 6 (six) hours as needed for wheezing or shortness of breath. 04/14/18  Yes Loletha Grayer, MD  aspirin EC 325 MG tablet Take 325 mg by mouth daily.   Yes [provider]  bumetanide (BUMEX) 1 MG tablet Take 1 mg by mouth every other day.   Yes [provider]  butalbital-aspirin-caffeine Acquanetta Chain) 50-325-40 MG capsule Take 1-2 capsules by mouth every 6 (six) hours as needed for headache.    Yes [provider]  cyanocobalamin 1000 MCG tablet Take 1,000 mcg by mouth daily.    Yes [provider]  diazepam (VALIUM) 5 MG tablet Take 5 mg by mouth 2 (two) times daily as needed for anxiety.    Yes [provider]  diphenhydrAMINE (BENADRYL) 50 MG capsule Take 1 capsule (50 mg total) by mouth at bedtime as needed for sleep. 11/09/17  Yes McNew, Tyson Babinski, MD  divalproex (DEPAKOTE ER) 500 MG 24 hr tablet Take 500 mg by mouth at bedtime.    Yes [provider]  donepezil (ARICEPT) 5 MG tablet Take 5 mg by mouth daily.  07/19/19  Yes [provider]  FANAPT 12 MG TABS Take 12 mg by mouth 2 (two) times daily.  11/14/16  Yes [provider]  feeding supplement, GLUCERNA SHAKE, (GLUCERNA SHAKE) LIQD Take 237 mLs by mouth daily.   Yes [provider]  ferrous sulfate 325 (65 FE) MG tablet Take 325 mg by mouth daily with breakfast.   Yes [provider]  fluticasone (FLONASE) 50 MCG/ACT nasal spray Place 2 sprays into both nostrils 2 (two) times daily as needed for allergies.    Yes [provider]  fluticasone (FLOVENT HFA) 220 MCG/ACT inhaler Inhale 2 puffs into the lungs 2 (two) times daily. Rinse out mouth afterwards 04/14/18  Yes Wieting, Richard, MD  gabapentin (NEURONTIN) 300 MG capsule Take 300 mg by mouth 3 (three) times daily.   Yes [provider]  gemfibrozil (LOPID) 600 MG tablet Take 600 mg by mouth 2 (two) times daily before a meal.   Yes [provider]  glimepiride (AMARYL) 4 MG tablet Take 4 mg by mouth daily with breakfast.  06/06/19  Yes [provider]  haloperidol (HALDOL) 5 MG tablet Take 5 mg by mouth 2 (two) times daily.    Yes [provider]  Insulin Degludec (TRESIBA FLEXTOUCH) 200 UNIT/ML SOPN Inject 90 Units into the skin daily.    Yes [provider]  losartan (COZAAR) 25 MG tablet Take 1 tablet (25 mg total) by mouth daily. 05/04/18  Yes Vaughan Basta, MD  memantine (NAMENDA) 10 MG tablet Take 10 mg by mouth 2 (two) times daily.   Yes [provider]  metFORMIN (GLUCOPHAGE) 1000  MG tablet Take 1,000 mg by mouth 2 (two) times daily with a meal.   Yes [provider]  metoprolol tartrate (LOPRESSOR) 25 MG tablet Take 0.5 tablets (12.5 mg total) by mouth 2 (two) times daily. 05/03/18  Yes Vaughan Basta, MD  mirtazapine (REMERON) 30 MG tablet Take 30 mg by mouth at bedtime. 04/21/18  Yes [provider]  nitroGLYCERIN (NITROSTAT) 0.4 MG SL tablet Place 0.4 mg under the tongue every 5 (five) minutes as needed for chest pain.    Yes [provider]  NOVOLOG FLEXPEN 100 UNIT/ML FlexPen Inject 6 Units into the skin 3 (three) times daily as needed for high blood sugar (blood sugar over 100). Add sliding scale if sugar over 100; 200-250=2 units, 251-300=4 units, 301-350=6 units, 351-400= 8 units, over 400=10 units. 03/16/19  Yes [provider]  Omega-3 Fatty Acids (FISH OIL) 1000 MG CAPS Take 1,000 mg by mouth daily.   Yes [provider]  omeprazole (PRILOSEC) 40 MG capsule Take 40 mg by mouth 2 (two) times daily.    Yes [provider]  OZEMPIC, 0.25 OR 0.5 MG/DOSE, 2 MG/1.5ML SOPN Inject 0.5 mg into the skin once a week. 06/06/19  Yes [provider]  promethazine (PHENERGAN) 6.25 MG/5ML syrup  Take 6.25 mg by mouth every 6 (six) hours as needed for nausea or vomiting.    Yes [provider]  rosuvastatin (CRESTOR) 40 MG tablet Take 40 mg by mouth daily.    Yes [provider]  sucralfate (CARAFATE) 1 g tablet Take 1 g by mouth 4 (four) times daily.  04/21/18  Yes [provider]  tamsulosin (FLOMAX) 0.4 MG CAPS capsule Take 0.4 mg by mouth daily. 08/02/19  Yes [provider]  triamcinolone cream (KENALOG) 0.1 % Apply 1 application topically 2 (two) times daily as needed. Patient taking differently: Apply 1 application topically 2 (two) times daily.  11/16/16  Yes Little, Traci M, PA-C  venlafaxine XR (EFFEXOR-XR) 75 MG 24 hr capsule Take 1 capsule (75 mg total) by mouth daily with breakfast. 11/09/17  Yes Marylin Crosby, MD    Physical Exam: Vitals:   08/14/19 2207 08/14/19 2301 08/15/19 0024  BP: 137/62  (!) 100/54  Pulse: 80    Resp: (!) 28  (!) 24  Temp: (!) 105 F (40.6 C) 100 F (37.8 C)   TempSrc: Oral    SpO2: 95%    Weight: 98 kg    Height: 5\' 7"  (1.702 m)       Vitals:   08/14/19 2207 08/14/19 2301 08/15/19 0024  BP: 137/62  (!) 100/54  Pulse: 80    Resp: (!) 28  (!) 24  Temp: (!) 105 F (40.6 C) 100 F (37.8 C)   TempSrc: Oral    SpO2: 95%    Weight: 98 kg    Height: 5\' 7"  (1.702 m)      Constitutional: NAD, alert and oriented x 3 Eyes: PERRL, lids and conjunctivae normal ENMT: Mucous membranes are moist.  Neck: normal, supple, no masses, no thyromegaly Respiratory: clear to auscultation bilaterally, no wheezing, no crackles. Normal respiratory effort. No accessory muscle use.  Cardiovascular: Regular rate and rhythm, no murmurs / rubs / gallops. No extremity edema. 2+ pedal pulses. No carotid bruits.  Abdomen: no tenderness, no masses palpated. No hepatosplenomegaly. Bowel sounds positive.  Musculoskeletal: no clubbing / cyanosis. No joint deformity upper and lower extremities.  Skin: no rashes, lesions, ulcers.    Neurologic: No gross  focal neurologic deficit. Psychiatric: Normal mood and affect.   Labs on Admission: I have personally reviewed following labs and imaging studies  CBC: Recent Labs  Lab 08/14/19 2213  WBC 10.4  NEUTROABS 7.9*  HGB 11.1*  HCT 32.8*  MCV 91.9  PLT 99991111   Basic Metabolic Panel: Recent Labs  Lab 08/14/19 2213  NA 141  K 3.1*  CL 107  CO2 25  GLUCOSE 78  BUN 14  CREATININE 1.15  CALCIUM 8.6*   GFR: Estimated Creatinine Clearance: 76.2 mL/min (by C-G formula based on SCr of 1.15 mg/dL). Liver Function Tests: Recent Labs  Lab 08/14/19 2213  AST 15  ALT 11  ALKPHOS 77  BILITOT 0.4  PROT 6.7  ALBUMIN 3.4*   No results for input(s): LIPASE, AMYLASE in the last 168 hours. No results for input(s): AMMONIA in the last 168 hours. Coagulation Profile: Recent Labs  Lab 08/14/19 2213  INR 1.1   Cardiac Enzymes: No results for input(s): CKTOTAL, CKMB, CKMBINDEX, TROPONINI in the last 168 hours. BNP (last 3 results) No results for input(s): PROBNP in the last 8760 hours. HbA1C: No results for input(s): HGBA1C in the last 72 hours. CBG: No results for input(s): GLUCAP in the last 168 hours. Lipid Profile: No results for input(s): CHOL, HDL, LDLCALC, TRIG, CHOLHDL, LDLDIRECT in the last 72 hours. Thyroid Function Tests: No results for input(s): TSH, T4TOTAL, FREET4, T3FREE, THYROIDAB in the last 72 hours. Anemia Panel: No results for input(s): VITAMINB12, FOLATE, FERRITIN, TIBC, IRON, RETICCTPCT in the last 72 hours. Urine analysis:    Component Value Date/Time   COLORURINE YELLOW (A) 08/14/2019 2213   APPEARANCEUR CLEAR (A) 08/14/2019 2213   APPEARANCEUR Clear 10/23/2014 1719   LABSPEC 1.020 08/14/2019 2213   LABSPEC 1.017 10/23/2014 1719   PHURINE 6.0 08/14/2019 2213   GLUCOSEU NEGATIVE 08/14/2019 2213   GLUCOSEU Negative 10/23/2014 1719   HGBUR NEGATIVE 08/14/2019 2213   [place] NEGATIVE 08/14/2019 2213   BILIRUBINUR Negative  10/23/2014 1719   KETONESUR NEGATIVE 08/14/2019 2213   PROTEINUR 100 (A) 08/14/2019 2213   NITRITE NEGATIVE 08/14/2019 2213   LEUKOCYTESUR NEGATIVE 08/14/2019 2213   LEUKOCYTESUR Negative 10/23/2014 1719    Radiological Exams on Admission: DG Chest 1 View  Result Date: 08/14/2019 CLINICAL DATA:  Fever EXAM: CHEST  1 VIEW COMPARISON:  05/03/2018 FINDINGS: Single frontal view of the chest demonstrates an unremarkable cardiac silhouette. No airspace disease, effusion, or pneumothorax. IMPRESSION: 1. No acute intrathoracic process. Electronically Signed   By: Randa Ngo M.D.   On: 08/14/2019 22:48     Assessment/Plan Principal Problem:   Suspect Sepsis ([place]) of unknown source in the setting of history of FUO (fever of unknown origin) with similar presentation -Patient presented with generalized malaise, fever and myalgias with procalcitonin less than 0.01 and has a history of similar presentation in November 2019, eventually ruling out sepsis -Treat empirically as sepsis based on fever, tachypnea and elevated lactic acid and suspected source of infection --Covid antigen test negative, chest x-ray no acute disease, urinalysis unremarkable.  Follow-up Covid PCR. -IV cefepime, vancomycin and metronidazole -IV fluids, NS infusion,not according to sepsis bolus due to concern for fluid overload given history of diastolic heart failure given that patient is not in septic shock. Received 2 L in th ER -Close hemodynamic monitoring -Given prior history of FUO presenting as sepsis, can consider ID consult --CT abdomen and pelvis given only other complaint of back pain  Active Problems:   Chronic diastolic heart failure (  [place]) -Not acutely exacerbated hold home Bumex consult for blood pressure of 100/54 -Continue losartan and metoprolol once medications reconciled and blood pressure improved  Chronic back pain/DDD --Patient states he is scheduled for back surgery in March 2021    HTN  (hypertension) -Hold home antihypertensives of losartan and metoprolol pending stabilization of blood pressure    Diabetes mellitus type II ([place]) -Hold home diabetic medication regimen -Sliding scale insulin coverage      HIV positive ([place]) -Home abacavir/dolutegravir/lamivudine once reconciled  Schizophrenia -Resume home meds once reconciled      DVT prophylaxis: lovenox  Code Status: full code  Family Communication: none  Disposition Plan: Back to previous home environment Consults called: none     Athena Masse MD [facility]     08/15/2019, 12:41 AM

## 2019-08-15 NOTE — ED Notes (Signed)
Spoke with [employer], np regarding continued hypoglycemia, order for maintence d10 received.

## 2019-08-15 NOTE — Progress Notes (Signed)
Pharmacy Antibiotic Note  Danny Cook. is a 59 y.o. male admitted on 08/14/2019 with sepsis.  Pharmacy has been consulted for Cefepime and Vancomycin dosing.  Plan: Vancomycin 2000 mg IV Q 24 hrs. Goal AUC 400-550. Expected AUC: 493.7 SCr used: 1.15 Expected Cmin: 10.7  Cefepime 2g IV q8h   Height: 5\' 7"  (170.2 cm) Weight: 216 lb (98 kg) IBW/kg (Calculated) : 66.1  Temp (24hrs), Avg:102.5 F (39.2 C), Min:100 F (37.8 C), Max:105 F (40.6 C)  Recent Labs  Lab 08/14/19 2213 08/15/19 0008  WBC 10.4  --   CREATININE 1.15  --   LATICACIDVEN 2.4* 1.7    Estimated Creatinine Clearance: 76.2 mL/min (by C-G formula based on SCr of 1.15 mg/dL).    Allergies  Allergen Reactions  . Trazodone And Nefazodone     Affects other meds that they dont work  . Trazodone Hcl Other (See Comments)  . Viread [Tenofovir Disoproxil]     Damages kidneys  . Etodolac Rash    Antimicrobials this admission: Cefepime 2/14 >>  Metronidazole 2/15 >>  Vancomycin 2/15 >>  Dose adjustments this admission:  Microbiology results:  Thank you for allowing pharmacy to be a part of this patient's care.  Paulina Fusi, PharmD, BCPS 08/15/2019 1:09 AM

## 2019-08-15 NOTE — ED Notes (Signed)
Pt provided with meal tray and juice.

## 2019-08-15 NOTE — ED Notes (Signed)
Pt in ct 

## 2019-08-15 NOTE — TOC Initial Note (Signed)
Transition of Care [facility]) - Initial/Assessment Note    Patient Details  Name: Danny Cook. MRN: [id number] Date of Birth: 05/12/1961  [facility] of [facility] CM/SW Contact:    Danny Hashimoto, LCSW Phone Number: 08/15/2019, 2:32 PM  Clinical Narrative:    Met with pt to discuss discharge needs. He reports he feels somewhat better, having trouble breathing and coughing a lot.  Have spoken with Danny Cook & N caregiver who reports he has been there for numerous years and has been independent and takes care of himself. He gets along will with the other five members. They will be happy to take him back. She reports there are three other residents here. She reports Danny Cook who is in charge will be out of town until Thursday. Will complete FL2 and keep updated on when pt will be medically ready for return back to [facility] care home.             Expected Discharge Plan: Group Home Barriers to Discharge: Continued Medical Work up   Patient Goals and CMS Choice        Expected Discharge Plan and Services Expected Discharge Plan: Group Home In-house Referral: Clinical Social Work     Living arrangements for the past 2 months: Group Home                                      Prior Living Arrangements/Services Living arrangements for the past 2 months: [place] Lives with:: Facility Resident   Do you feel safe going back to the place where you live?: Yes               Activities of Daily Living Home Assistive Devices/Equipment: Dentures (specify type), Eyeglasses, Cane (specify quad or straight)(top & bottom dentures/cane as needed) ADL Screening (condition at time of admission) Patient's cognitive ability adequate to safely complete daily activities?: Yes Is the patient deaf or have difficulty hearing?: No Does the patient have difficulty seeing, even when wearing glasses/contacts?: No Does the patient have difficulty concentrating, remembering, or  making decisions?: No Patient able to express need for assistance with ADLs?: Yes Does the patient have difficulty dressing or bathing?: Yes Independently performs ADLs?: Yes (appropriate for developmental age) Does the patient have difficulty walking or climbing stairs?: Yes Weakness of Legs: Both Weakness of Arms/Hands: Both  Permission Sought/Granted Permission sought to share information with : Chartered certified accountant granted to share information with : Yes, Verbal Permission Granted  Share Information with NAME: Danny Cook or [place] granted to share info w AGENCY: [place] granted to share info w Relationship: Facilty caregivers     Emotional Assessment Appearance:: Appears older than stated age Attitude/Demeanor/Rapport: Engaged Affect (typically observed): Accepting, Adaptable Orientation: : Oriented to Self, Oriented to Place, Oriented to  Time, Oriented to Situation      Admission diagnosis:  Sepsis ([place]) [A41.9] Sepsis without acute organ dysfunction, due to unspecified organism [facility]) [A41.9] Patient Active Problem List   Diagnosis Date Noted  . Sepsis ([place]) 08/15/2019  . NSTEMI (non-ST elevated myocardial infarction) ([place]) 06/07/2018  . Lymphedema 05/12/2018  . Pleural effusion   . FUO (fever of unknown origin)   . MGUS (monoclonal gammopathy of unknown significance)   . 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 Healthcare associated bacterial pneumonia 04/25/2018  . Community acquired pneumonia 04/12/2018  .  Pneumonia 04/12/2018  . AKI (acute kidney injury) ([place]) 03/23/2018  . Pericarditis with effusion 03/23/2018  . Schizoaffective disorder, depressive type ([place]) 11/04/2017  . HIV positive ([place]) 11/04/2017  . Orthostatic dizziness 07/31/2017  . Chronic diastolic heart failure ([place]) 07/29/2017  . HTN (hypertension) 07/29/2017  . Diabetes ([place]) 07/29/2017  . Schizophrenia ([place]) 07/29/2017    PCP:  Danny Harrier, MD Pharmacy:   [place], [place] - [address] [place] [address] Phone: [phone] Fax: [phone]     Social Determinants of Health (SDOH) Interventions    Readmission Risk Interventions Readmission Risk Prevention Plan 03/27/2018  Transportation Screening Complete  Medication Review ([place]) Complete  PCP or Specialist appointment within 3-5 days of discharge Complete  HRI or Home Care Consult Complete  SW Recovery Care/Counseling Consult Complete  Palliative Care Screening Not Complete  Medication Reconcilation (Pharmacy) Complete  [place] Not Complete  Some recent data might be hidden

## 2019-08-15 NOTE — Progress Notes (Signed)
PROGRESS NOTE    Danny Cook.  BF:[id number] DOB: June 04, 1961 DOA: 08/14/2019 PCP: Tracie Harrier, MD      Assessment & Plan:   Principal Problem:   Sepsis ([place]) Active Problems:   Chronic diastolic heart failure (HCC)   HTN (hypertension)   Diabetes ([place])   Orthostatic dizziness   HIV positive (HCC)   FUO (fever of unknown origin)   Suspect sepsis: possibly secondary to pneumonia. COVID19 PCR neg. Influenza neg. Urine cx, blood cx pending. Continue on IV cefepime, vancomycin and metronidazole. Continue on IVFs   Possible pneumonia: b/l as per CT. Continue on IV abxs. Pro-cal 0.10. Started on bronchodilators. Encourage incentive spirometry  Leukocytosis: secondary to infection. Continue on IV abxs   Hypokalemia: KCl repleated. Will continue to monitor   Chronic diastolic heart failure: Continue losartan and metoprolol. Hold for MAP <65  Chronic back pain/DDD: scheduled for back surgery in March 2021  HTN: continue losartan and metoprolol. Hold for MAP <65  DM2: continue to hold home po DM meds. Continue on SSI w/ accuchecks  HIV: continue on home dose of abacavir/dolutegravir/lamivudine   Schizophrenia: continue on home dose of haldol, depakote    DVT prophylaxis: lovenox Code Status: full Family Communication: Disposition Plan:    Consultants:      Procedures:    Antimicrobials: vanco, flagyl, cefepime   Subjective: Pt c/o weakness   Objective: Vitals:   08/15/19 0400 08/15/19 0423 08/15/19 0430 08/15/19 0600  BP: 109/73  121/67 (!) 142/71  Pulse: 65  66 81  Resp: 15  20   Temp: 98 F (36.7 C) 98.4 F (36.9 C)  (!) 102.7 F (39.3 C)  TempSrc: Axillary Oral  Oral  SpO2: 100%  98% 100%  Weight:      Height:        Intake/Output Summary (Last 24 hours) at 08/15/2019 0717 Last data filed at 08/15/2019 0354 Gross per 24 hour  Intake 2900 ml  Output -  Net 2900 ml   Filed Weights   08/14/19 2207  Weight: 98 kg    Examination:  General exam: Appears calm but uncomfortable  Respiratory system: diminished breath sounds b/l. No rales  Cardiovascular system: S1 & S2 +. No rubs, gallops or clicks. Gastrointestinal system: Abdomen is nondistended, soft and nontender. Normal bowel sounds heard. Central nervous system: Alert and oriented. Moves all 4 extremities Psychiatry: Judgement and insight appear normal. Flat mood and affect     Data Reviewed: I have personally reviewed following labs and imaging studies  CBC: Recent Labs  Lab 08/14/19 2213 08/15/19 0505  WBC 10.4 12.4*  NEUTROABS 7.9*  --   HGB 11.1* 11.8*  HCT 32.8* 35.6*  MCV 91.9 95.2  PLT 190 [id number]   Basic Metabolic Panel: Recent Labs  Lab 08/14/19 2213 08/15/19 0505  NA 141 143  K 3.1* 3.4*  CL 107 110  CO2 25 25  GLUCOSE 78 116*  BUN 14 13  CREATININE 1.15 1.11  CALCIUM 8.6* 8.3*   GFR: Estimated Creatinine Clearance: 79 mL/min (by C-G formula based on SCr of 1.11 mg/dL). Liver Function Tests: Recent Labs  Lab 08/14/19 2213 08/15/19 0505  AST 15 18  ALT 11 10  ALKPHOS 77 76  BILITOT 0.4 0.6  PROT 6.7 6.7  ALBUMIN 3.4* 3.4*   No results for input(s): LIPASE, AMYLASE in the last 168 hours. No results for input(s): AMMONIA in the last 168 hours. Coagulation Profile: Recent Labs  Lab 08/14/19 2213 08/15/19 0505  INR 1.1 1.0   Cardiac Enzymes: No results for input(s): CKTOTAL, CKMB, CKMBINDEX, TROPONINI in the last 168 hours. BNP (last 3 results) No results for input(s): PROBNP in the last 8760 hours. HbA1C: Recent Labs    08/14/19 2213  HGBA1C 5.6   CBG: Recent Labs  Lab 08/15/19 0128 08/15/19 0227 08/15/19 0258 08/15/19 0359 08/15/19 0425  GLUCAP 137* 72 69* 106* 112*   Lipid Profile: No results for input(s): CHOL, HDL, LDLCALC, TRIG, CHOLHDL, LDLDIRECT in the last 72 hours. Thyroid Function Tests: No results for input(s): TSH, T4TOTAL, FREET4, T3FREE, THYROIDAB in the last 72 hours. Anemia  Panel: No results for input(s): VITAMINB12, FOLATE, FERRITIN, TIBC, IRON, RETICCTPCT in the last 72 hours. Sepsis Labs: Recent Labs  Lab 08/14/19 2213 08/15/19 0008 08/15/19 0505  PROCALCITON 0.10  --  <0.10  LATICACIDVEN 2.4* 1.7  --     Recent Results (from the past 240 hour(s))  Respiratory Panel by RT PCR (Flu A&B, Covid) - Nasopharyngeal Swab     Status: None   Collection Time: 08/14/19 12:05 AM   Specimen: Nasopharyngeal Swab  Result Value Ref Range Status   SARS Coronavirus 2 by RT PCR NEGATIVE NEGATIVE Final    Comment: (NOTE) SARS-CoV-2 target nucleic acids are NOT DETECTED. The SARS-CoV-2 RNA is generally detectable in upper respiratoy specimens during the acute phase of infection. The lowest concentration of SARS-CoV-2 viral copies this assay can detect is 131 copies/mL. A negative result does not preclude SARS-Cov-2 infection and should not be used as the sole basis for treatment or other patient management decisions. A negative result may occur with  improper specimen collection/handling, submission of specimen other than nasopharyngeal swab, presence of viral mutation(s) within the areas targeted by this assay, and inadequate number of viral copies (<131 copies/mL). A negative result must be combined with clinical observations, patient history, and epidemiological information. The expected result is Negative. Fact Sheet for Patients:  PinkCheek.be Fact Sheet for Healthcare Providers:  GravelBags.it This test is not yet ap proved or cleared by the [place] FDA and  has been authorized for detection and/or diagnosis of SARS-CoV-2 by FDA under an Emergency Use Authorization (EUA). This EUA will remain  in effect (meaning this test can be used) for the duration of the COVID-19 declaration under Section 564(b)(1) of the Act, 21 U.S.C. section 360bbb-3(b)(1), unless the authorization is terminated or  revoked sooner.    Influenza A by PCR NEGATIVE NEGATIVE Final   Influenza B by PCR NEGATIVE NEGATIVE Final    Comment: (NOTE) The Xpert Xpress SARS-CoV-2/FLU/RSV assay is intended as an aid in  the diagnosis of influenza from Nasopharyngeal swab specimens and  should not be used as a sole basis for treatment. Nasal washings and  aspirates are unacceptable for Xpert Xpress SARS-CoV-2/FLU/RSV  testing. Fact Sheet for Patients: PinkCheek.be Fact Sheet for Healthcare Providers: GravelBags.it This test is not yet approved or cleared by the [place] FDA and  has been authorized for detection and/or diagnosis of SARS-CoV-2 by  FDA under an Emergency Use Authorization (EUA). This EUA will remain  in effect (meaning this test can be used) for the duration of the  Covid-19 declaration under Section 564(b)(1) of the Act, 21  U.S.C. section 360bbb-3(b)(1), unless the authorization is  terminated or revoked. Performed at [facility], [address], [place], [place] [address]          Radiology Studies: CT ABDOMEN PELVIS WO CONTRAST  Result Date: 08/15/2019 CLINICAL DATA:  Low back pain. EXAM: CT ABDOMEN AND PELVIS WITHOUT CONTRAST TECHNIQUE: Multidetector CT imaging of the abdomen and pelvis was performed following the standard protocol without IV contrast. COMPARISON:  04/29/2018. FINDINGS: Lower chest: There are bibasilar airspace opacities concerning for consolidation.The heart size is normal. Hepatobiliary: The liver is normal. Normal gallbladder.There is no biliary ductal dilation. Pancreas: Normal contours without ductal dilatation. No peripancreatic fluid collection. Spleen: No splenic laceration or hematoma. Adrenals/Urinary Tract: --Adrenal glands: There is a stable left adrenal mass measuring approximately 3.8 cm --Right kidney/ureter: There is a nonobstructing stone in the interpolar region of the right kidney  measuring approximately 1.1 cm (axial series 2, image 44). --Left kidney/ureter: No hydronephrosis or perinephric hematoma. --Urinary bladder: Unremarkable. Stomach/Bowel: --Stomach/Duodenum: No hiatal hernia or other gastric abnormality. Normal duodenal course and caliber. --Small bowel: No dilatation or inflammation. --Colon: No focal abnormality. --Appendix: Normal. Vascular/Lymphatic: Atherosclerotic calcification is present within the non-aneurysmal abdominal aorta, without hemodynamically significant stenosis. --No retroperitoneal lymphadenopathy. --No mesenteric lymphadenopathy. --No pelvic or inguinal lymphadenopathy. Reproductive: Unremarkable Other: No ascites or free air. The abdominal wall is normal. Musculoskeletal. No acute displaced fractures. IMPRESSION: 1. Bibasilar airspace opacities concerning no valve ping pneumonia. 2. Nonobstructing right nephrolithiasis. 3. Stable left adrenal mass. 4. No acute intra-abdominal or pelvic abnormalities. 5. Aortic Atherosclerosis (ICD10-I70.0). Electronically Signed   By: Constance Holster M.D.   On: 08/15/2019 02:28   DG Chest 1 View  Result Date: 08/14/2019 CLINICAL DATA:  Fever EXAM: CHEST  1 VIEW COMPARISON:  05/03/2018 FINDINGS: Single frontal view of the chest demonstrates an unremarkable cardiac silhouette. No airspace disease, effusion, or pneumothorax. IMPRESSION: 1. No acute intrathoracic process. Electronically Signed   By: Randa Ngo M.D.   On: 08/14/2019 22:48        Scheduled Meds: . enoxaparin (LOVENOX) injection  40 mg Subcutaneous Q24H  . insulin aspart  0-15 Units Subcutaneous TID WC  . insulin aspart  0-5 Units Subcutaneous QHS   Continuous Infusions: . sodium chloride 100 mL/hr at 08/15/19 0120  . ceFEPime (MAXIPIME) IV    . dextrose 30 mL/hr at 08/15/19 0311  . metronidazole    . vancomycin       LOS: 0 days    Time spent: 33 mins     Wyvonnia Dusky, MD [facility] Pager [phone]  If  7PM-7AM, please contact night-coverage [email or web address] 08/15/2019, 7:17 AM

## 2019-08-16 DIAGNOSIS — I5032 Chronic diastolic (congestive) heart failure: Secondary | ICD-10-CM

## 2019-08-16 DIAGNOSIS — J181 Lobar pneumonia, unspecified organism: Secondary | ICD-10-CM

## 2019-08-16 LAB — BASIC METABOLIC PANEL
Anion gap: 10 (ref 5–15)
BUN: 13 mg/dL (ref 6–20)
CO2: 23 mmol/L (ref 22–32)
Calcium: 8.3 mg/dL — ABNORMAL LOW (ref 8.9–10.3)
Chloride: 109 mmol/L (ref 98–111)
Creatinine, Ser: 1.32 mg/dL — ABNORMAL HIGH (ref 0.61–1.24)
GFR calc Af Amer: >60 mL/min (ref >60)
GFR calc non Af Amer: 58 mL/min — ABNORMAL LOW (ref >60)
Glucose, Bld: 106 mg/dL — ABNORMAL HIGH (ref 70–99)
Potassium: 3.2 mmol/L — ABNORMAL LOW (ref 3.5–5.1)
Sodium: 142 mmol/L (ref 135–145)

## 2019-08-16 LAB — CBC
HCT: 30.1 % — ABNORMAL LOW (ref 39.0–52.0)
Hemoglobin: 10 g/dL — ABNORMAL LOW (ref 13.0–17.0)
MCH: 31.2 pg (ref 26.0–34.0)
MCHC: 33.2 g/dL (ref 30.0–36.0)
MCV: 93.8 fL (ref 80.0–100.0)
Platelets: 168 10*3/uL (ref 150–400)
RBC: 3.21 MIL/uL — ABNORMAL LOW (ref 4.22–5.81)
RDW: 13.7 % (ref 11.5–15.5)
WBC: 9.8 10*3/uL (ref 4.0–10.5)
nRBC: 0 % (ref 0.0–0.2)

## 2019-08-16 LAB — GLUCOSE, CAPILLARY
Glucose-Capillary: 85 mg/dL (ref 70–99)
Glucose-Capillary: 113 mg/dL — ABNORMAL HIGH (ref 70–99)
Glucose-Capillary: 122 mg/dL — ABNORMAL HIGH (ref 70–99)

## 2019-08-16 LAB — VANCOMYCIN, RANDOM: Vancomycin Rm: 7

## 2019-08-16 MED ORDER — DIAZEPAM 5 MG PO TABS
5.0000 mg | ORAL_TABLET | Freq: Two times a day (BID) | ORAL | Status: DC | PRN
  Administered 2019-08-16 – 2019-08-19 (×4): 5 mg via ORAL
  Filled 2019-08-16 (×4): qty 1

## 2019-08-16 MED ORDER — VANCOMYCIN HCL 1750 MG/350ML IV SOLN
1750.0000 mg | INTRAVENOUS | Status: DC
  Administered 2019-08-16 – 2019-08-18 (×3): 1750 mg via INTRAVENOUS
  Filled 2019-08-16 (×4): qty 350

## 2019-08-16 MED ORDER — DIPHENHYDRAMINE HCL 25 MG PO CAPS
50.0000 mg | ORAL_CAPSULE | Freq: Every day | ORAL | Status: DC
  Administered 2019-08-16 – 2019-08-18 (×3): 50 mg via ORAL
  Filled 2019-08-16 (×3): qty 2

## 2019-08-16 MED ORDER — METRONIDAZOLE 500 MG PO TABS
500.0000 mg | ORAL_TABLET | Freq: Three times a day (TID) | ORAL | Status: DC
  Administered 2019-08-16 – 2019-08-19 (×8): 500 mg via ORAL
  Filled 2019-08-16 (×8): qty 1

## 2019-08-16 MED ORDER — POTASSIUM CHLORIDE CRYS ER 20 MEQ PO TBCR
20.0000 meq | EXTENDED_RELEASE_TABLET | Freq: Once | ORAL | Status: AC
  Administered 2019-08-16: 20 meq via ORAL
  Filled 2019-08-16: qty 1

## 2019-08-16 MED ORDER — VANCOMYCIN VARIABLE DOSE PER UNSTABLE RENAL FUNCTION (PHARMACIST DOSING): Status: DC

## 2019-08-16 NOTE — Plan of Care (Signed)
Patient requesting Valium for anxiety. RN educated on deep breathing. Patient advised to attempt exercised and if they did not help on call provider would be contacted. Breathing exercises appeared effective as patient was asleep for next round.

## 2019-08-16 NOTE — Progress Notes (Signed)
PROGRESS NOTE    Danny Cook.  [id number] DOB: Jul 08, 1960 DOA: 08/14/2019 PCP: Tracie Harrier, MD      Assessment & Plan:   Principal Problem:   Sepsis ([place]) Active Problems:   Chronic diastolic heart failure (HCC)   HTN (hypertension)   Diabetes ([place])   Orthostatic dizziness   HIV positive (HCC)   FUO (fever of unknown origin)   Suspect sepsis: possibly secondary to pneumonia. Fever curve is trending down. COVID19 PCR neg. Influenza neg. Urine cx shows insignificant growth, blood cx NGTD. Continue on IV cefepime, vancomycin and metronidazole. Consider d/c vanco tomorrow if fever continues to trend down & Cr continues to trend up. Continue on IVFs   Possible pneumonia: b/l as per CT. Continue on IV abxs. Pro-cal 0.10. Continue on bronchodilators. Encourage incentive spirometry  Leukocytosis: resolved   Hypokalemia: KCl repleated. Will continue to monitor   Chronic diastolic heart failure: Continue losartan and metoprolol. Hold for MAP <65  Chronic back pain/DDD: scheduled for back surgery in March 2021  HTN: continue losartan and metoprolol. Hold for MAP <65  DM2: continue to hold home po DM meds. Continue on SSI w/ accuchecks  Normocytic anemia: no need for a transfusion at this time. Will continue to monitor H&H  HIV: continue on home dose of abacavir/dolutegravir/lamivudine   Schizophrenia: continue on home dose of haldol, depakote    DVT prophylaxis: lovenox Code Status: full Family Communication: Disposition Plan: will likely need several more days as pt clinically improves   Consultants:   n/a   Procedures:    Antimicrobials: vanco, flagyl, cefepime   Subjective: Pt c/o fatigue  Objective: Vitals:   08/15/19 0826 08/15/19 1418 08/15/19 1555 08/16/19 0010  BP: 126/62  119/66 114/76  Pulse: 75  66 74  Resp: 18  18 17   Temp: (!) 100.6 F (38.1 C)  98.6 F (37 C) 99.6 F (37.6 C)  TempSrc: Oral  Oral   SpO2: 96%  96% 95% 93%  Weight:      Height:        Intake/Output Summary (Last 24 hours) at 08/16/2019 0723 Last data filed at 08/16/2019 0500 Gross per 24 hour  Intake 1922.42 ml  Output 300 ml  Net 1622.42 ml   Filed Weights   08/14/19 2207  Weight: 98 kg    Examination:  General exam: Appears calm but comfortable  Respiratory system: decreased breath sounds b/l. No rales  Cardiovascular system: S1 & S2 +. No rubs, gallops or clicks. Gastrointestinal system: Abdomen is nondistended, soft and nontender. Normal bowel sounds heard. Central nervous system: Alert and oriented. Moves all 4 extremities Psychiatry: Judgement and insight appear normal. Flat mood and affect     Data Reviewed: I have personally reviewed following labs and imaging studies  CBC: Recent Labs  Lab 08/14/19 2213 08/15/19 0505 08/16/19 0354  WBC 10.4 12.4* 9.8  NEUTROABS 7.9*  --   --   HGB 11.1* 11.8* 10.0*  HCT 32.8* 35.6* 30.1*  MCV 91.9 95.2 93.8  PLT 190 180 [id number]   Basic Metabolic Panel: Recent Labs  Lab 08/14/19 2213 08/15/19 0505 08/16/19 0354  NA 141 143 142  K 3.1* 3.4* 3.2*  CL 107 110 109  CO2 25 25 23   GLUCOSE 78 116* 106*  BUN 14 13 13   CREATININE 1.15 1.11 1.32*  CALCIUM 8.6* 8.3* 8.3*   GFR: Estimated Creatinine Clearance: 66.4 mL/min (A) (by C-G formula based on SCr of 1.32 mg/dL (H)). Liver Function Tests: Recent  Labs  Lab 08/14/19 2213 08/15/19 0505  AST 15 18  ALT 11 10  ALKPHOS 77 76  BILITOT 0.4 0.6  PROT 6.7 6.7  ALBUMIN 3.4* 3.4*   No results for input(s): LIPASE, AMYLASE in the last 168 hours. No results for input(s): AMMONIA in the last 168 hours. Coagulation Profile: Recent Labs  Lab 08/14/19 2213 08/15/19 0505  INR 1.1 1.0   Cardiac Enzymes: No results for input(s): CKTOTAL, CKMB, CKMBINDEX, TROPONINI in the last 168 hours. BNP (last 3 results) No results for input(s): PROBNP in the last 8760 hours. HbA1C: Recent Labs    08/14/19 2213  HGBA1C 5.6    CBG: Recent Labs  Lab 08/15/19 1209 08/15/19 1648 08/15/19 1859 08/15/19 2052 08/15/19 2211  GLUCAP 55* 62* 83 94 90   Lipid Profile: No results for input(s): CHOL, HDL, LDLCALC, TRIG, CHOLHDL, LDLDIRECT in the last 72 hours. Thyroid Function Tests: No results for input(s): TSH, T4TOTAL, FREET4, T3FREE, THYROIDAB in the last 72 hours. Anemia Panel: No results for input(s): VITAMINB12, FOLATE, FERRITIN, TIBC, IRON, RETICCTPCT in the last 72 hours. Sepsis Labs: Recent Labs  Lab 08/14/19 2213 08/15/19 0008 08/15/19 0505  PROCALCITON 0.10  --  <0.10  LATICACIDVEN 2.4* 1.7  --     Recent Results (from the past 240 hour(s))  Respiratory Panel by RT PCR (Flu A&B, Covid) - Nasopharyngeal Swab     Status: None   Collection Time: 08/14/19 12:05 AM   Specimen: Nasopharyngeal Swab  Result Value Ref Range Status   SARS Coronavirus 2 by RT PCR NEGATIVE NEGATIVE Final    Comment: (NOTE) SARS-CoV-2 target nucleic acids are NOT DETECTED. The SARS-CoV-2 RNA is generally detectable in upper respiratoy specimens during the acute phase of infection. The lowest concentration of SARS-CoV-2 viral copies this assay can detect is 131 copies/mL. A negative result does not preclude SARS-Cov-2 infection and should not be used as the sole basis for treatment or other patient management decisions. A negative result may occur with  improper specimen collection/handling, submission of specimen other than nasopharyngeal swab, presence of viral mutation(s) within the areas targeted by this assay, and inadequate number of viral copies (<131 copies/mL). A negative result must be combined with clinical observations, patient history, and epidemiological information. The expected result is Negative. Fact Sheet for Patients:  PinkCheek.be Fact Sheet for Healthcare Providers:  GravelBags.it This test is not yet ap proved or cleared by the [place] FDA and  has been authorized for detection and/or diagnosis of SARS-CoV-2 by FDA under an Emergency Use Authorization (EUA). This EUA will remain  in effect (meaning this test can be used) for the duration of the COVID-19 declaration under Section 564(b)(1) of the Act, 21 U.S.C. section 360bbb-3(b)(1), unless the authorization is terminated or revoked sooner.    Influenza A by PCR NEGATIVE NEGATIVE Final   Influenza B by PCR NEGATIVE NEGATIVE Final    Comment: (NOTE) The Xpert Xpress SARS-CoV-2/FLU/RSV assay is intended as an aid in  the diagnosis of influenza from Nasopharyngeal swab specimens and  should not be used as a sole basis for treatment. Nasal washings and  aspirates are unacceptable for Xpert Xpress SARS-CoV-2/FLU/RSV  testing. Fact Sheet for Patients: PinkCheek.be Fact Sheet for Healthcare Providers: GravelBags.it This test is not yet approved or cleared by the [place] FDA and  has been authorized for detection and/or diagnosis of SARS-CoV-2 by  FDA under an Emergency Use Authorization (EUA). This EUA will remain  in effect (meaning this test  can be used) for the duration of the  Covid-19 declaration under Section 564(b)(1) of the Act, 21  U.S.C. section 360bbb-3(b)(1), unless the authorization is  terminated or revoked. Performed at [facility], [address] [place], [place] [address]   Culture, blood (Routine x 2)     Status: None (Preliminary result)   Collection Time: 08/14/19 10:13 PM   Specimen: BLOOD  Result Value Ref Range Status   Specimen Description BLOOD RIGHT ARM  Final   Special Requests   Final    BOTTLES DRAWN AEROBIC AND ANAEROBIC Blood Culture adequate volume   Culture   Final    NO GROWTH < 12 HOURS Performed at [facility], [address] [place], [place] [address]    Report Status PENDING  Incomplete  Culture, blood (Routine x 2)     Status: None  (Preliminary result)   Collection Time: 08/14/19 10:13 PM   Specimen: BLOOD  Result Value Ref Range Status   Specimen Description BLOOD RIGHT ANTECUBITAL  Final   Special Requests   Final    BOTTLES DRAWN AEROBIC AND ANAEROBIC Blood Culture adequate volume   Culture   Final    NO GROWTH < 12 HOURS Performed at [facility], [address], [place], [place] [address]    Report Status PENDING  Incomplete  Urine culture     Status: Abnormal   Collection Time: 08/14/19 10:13 PM   Specimen: Urine, Random  Result Value Ref Range Status   Specimen Description   Final    URINE, RANDOM Performed at [facility], [address] [place], [place] [address]    Special Requests   Final    NONE Performed at [facility], [address], [place], [place] [address]    Culture (A)  Final    <10,000 COLONIES/mL INSIGNIFICANT GROWTH Performed at [facility], [place] [address], [place], [place] [address]    Report Status 08/15/2019 FINAL  Final         Radiology Studies: CT ABDOMEN PELVIS WO CONTRAST  Result Date: 08/15/2019 CLINICAL DATA:  Low back pain. EXAM: CT ABDOMEN AND PELVIS WITHOUT CONTRAST TECHNIQUE: Multidetector CT imaging of the abdomen and pelvis was performed following the standard protocol without IV contrast. COMPARISON:  04/29/2018. FINDINGS: Lower chest: There are bibasilar airspace opacities concerning for consolidation.The heart size is normal. Hepatobiliary: The liver is normal. Normal gallbladder.There is no biliary ductal dilation. Pancreas: Normal contours without ductal dilatation. No peripancreatic fluid collection. Spleen: No splenic laceration or hematoma. Adrenals/Urinary Tract: --Adrenal glands: There is a stable left adrenal mass measuring approximately 3.8 cm --Right kidney/ureter: There is a nonobstructing stone in the interpolar region of the right kidney measuring approximately 1.1 cm (axial series 2, image 44). --Left  kidney/ureter: No hydronephrosis or perinephric hematoma. --Urinary bladder: Unremarkable. Stomach/Bowel: --Stomach/Duodenum: No hiatal hernia or other gastric abnormality. Normal duodenal course and caliber. --Small bowel: No dilatation or inflammation. --Colon: No focal abnormality. --Appendix: Normal. Vascular/Lymphatic: Atherosclerotic calcification is present within the non-aneurysmal abdominal aorta, without hemodynamically significant stenosis. --No retroperitoneal lymphadenopathy. --No mesenteric lymphadenopathy. --No pelvic or inguinal lymphadenopathy. Reproductive: Unremarkable Other: No ascites or free air. The abdominal wall is normal. Musculoskeletal. No acute displaced fractures. IMPRESSION: 1. Bibasilar airspace opacities concerning no valve ping pneumonia. 2. Nonobstructing right nephrolithiasis. 3. Stable left adrenal mass. 4. No acute intra-abdominal or pelvic abnormalities. 5. Aortic Atherosclerosis (ICD10-I70.0). Electronically Signed   By: Constance Holster M.D.   On: 08/15/2019 02:28  DG Chest 1 View  Result Date: 08/14/2019 CLINICAL DATA:  Fever EXAM: CHEST  1 VIEW COMPARISON:  05/03/2018 FINDINGS: Single frontal view of the chest demonstrates an unremarkable cardiac silhouette. No airspace disease, effusion, or pneumothorax. IMPRESSION: 1. No acute intrathoracic process. Electronically Signed   By: Randa Ngo M.D.   On: 08/14/2019 22:48        Scheduled Meds: . abacavir-dolutegravir-lamiVUDine  1 tablet Oral QHS  . aspirin  325 mg Oral Daily  . divalproex  500 mg Oral Daily  . donepezil  5 mg Oral QHS  . enoxaparin (LOVENOX) injection  40 mg Subcutaneous Q24H  . ferrous sulfate  325 mg Oral Q breakfast  . gabapentin  300 mg Oral TID  . gemfibrozil  600 mg Oral BID AC  . haloperidol  5 mg Oral BID  . insulin aspart  0-15 Units Subcutaneous TID WC  . insulin aspart  0-5 Units Subcutaneous QHS  . losartan  25 mg Oral Daily  . memantine  10 mg Oral BID  . metoprolol  tartrate  12.5 mg Oral BID  . venlafaxine XR  75 mg Oral Daily   Continuous Infusions: . sodium chloride 100 mL/hr at 08/15/19 0120  . ceFEPime (MAXIPIME) IV Stopped (08/15/19 2356)  . dextrose 30 mL/hr at 08/15/19 0311  . metronidazole Stopped (08/16/19 0021)  . vancomycin Stopped (08/15/19 1238)     LOS: 1 day    Time spent: 33 mins     Wyvonnia Dusky, MD [facility] Pager [phone]  If 7PM-7AM, please contact night-coverage [email or web address] 08/16/2019, 7:23 AM

## 2019-08-16 NOTE — Progress Notes (Signed)
I met this gentleman on my way to another patient. Upon introduction we chatted briefly. He requested prayer, I prayed with patient and assured him I would check back on him.

## 2019-08-16 NOTE — Evaluation (Signed)
Occupational Therapy Evaluation Patient Details Name: Danny Cook. MRN: [id number] DOB: [birth year] Today's Date: 61/16/2021    History of Present Illness Pt is 59 y/o M with PMH: dCHF, HTN, DM, HIV, paranoia, schizophrenia, and sleep apnea. Pt presented with fever which is now trending down. COVID19 neg. Influenza neg. Urine cx shows insignificant growth, blood cx NGTD. Suspect sepsis 2/2 PNA.   Clinical Impression   Pt was seen for OT evaluation this date. Prior to hospital admission, pt was Indep with BADLs, required assistance from staff for IADLs including cooking/cleaning. Pt lives in family care home with 6 other residents with level entry in the front, but does endorse 4 steps in the back which he has to take to empty trash. Currently pt demonstrates impairments as described below (See OT problem list) which functionally limit his ability to perform ADL/self-care tasks. Pt currently requires MIN A for ADL transfers, setup for seated UB/LB ADLs, MIN A for standing ADLs with RW for balance, and CGA for fxl mobility.  Pt would benefit from skilled OT to address noted impairments and functional limitations (see below for any additional details) in order to maximize safety and independence while minimizing falls risk and caregiver burden. Upon hospital discharge, recommend HHOT to maximize pt safety and return to functional independence during meaningful occupations of daily life.     Follow Up Recommendations  Home health OT;Supervision - Intermittent    Equipment Recommendations  3 in 1 bedside commode;Other (comment)(RW)    Recommendations for Other Services       Precautions / Restrictions Precautions Precautions: Fall Restrictions Weight Bearing Restrictions: No      Mobility Bed Mobility Overal bed mobility: Modified Independent                Transfers Overall transfer level: Needs assistance Equipment used: Rolling walker (2 wheeled) Transfers: Sit  to/from Stand Sit to Stand: Min assist         General transfer comment: MIN verbal cues for sequencing safe hand placement with use of RW    Balance Overall balance assessment: Needs assistance   Sitting balance-Leahy Scale: Normal     Standing balance support: Bilateral upper extremity supported Standing balance-Leahy Scale: Fair Standing balance comment: CGA with RW for support in standing                           ADL either performed or assessed with clinical judgement   ADL                                         General ADL Comments: Pt requires setup for UB ADLs, extended time and setup for LB dressing in sitting, MIN A with standing ADLs with RW for balance, MIN A for clothing mgt over hips in standing, MIN A for ADL transfers, CGA for fxl mobility with OT managing IV pole as well.     Vision Patient Visual Report: No change from baseline       Perception     Praxis      Pertinent Vitals/Pain Pain Assessment: No/denies pain     Hand Dominance Right   Extremity/Trunk Assessment Upper Extremity Assessment Upper Extremity Assessment: RUE deficits/detail;LUE deficits/detail;Overall [facility] for tasks assessed RUE Deficits / Details: shld flex 1/2 arc of motion, shld MMT 3-/5, elbow and grip 4/5 LUE Deficits / Details:  shld flex 1/2 arc of motion, shld MMT 3-/5, elbow and grip 4/5   Lower Extremity Assessment Lower Extremity Assessment: Defer to PT evaluation;Overall WFL for tasks assessed       Communication Communication Communication: No difficulties   Cognition Arousal/Alertness: Awake/alert Behavior During Therapy: WFL for tasks assessed/performed Overall Cognitive Status: Within Functional Limits for tasks assessed                                 General Comments: some slow speech, but generally good processing and is A&O x4   General Comments       Exercises Other Exercises Other Exercises: OT  facilitates education re: role of OT in acute setting, pt verbalized understanding. Other Exercises: OT facilitates education re: fall prevention considerations including notifying pt of chair alarm and encouraging call bell use. Pt verbalized good understanding.   Shoulder Instructions      Home Living Family/patient expects to be discharged to:: Other (Comment)("Family Care Home" with 6 residents)                             Home Equipment: Shower seat   Additional Comments: level entry in front, 4 STE in back-takes this route to empty his trash.      Prior Functioning/Environment Level of Independence: Independent        Comments: Pt reports that he does not ask for help with bathing/dressing although this is a struggle for him and he has low back/LE pain with these tasks. Reports that if he has to go to appt that he gets Medicaid trasportation. Reports that the home he lives in provides meals, does laundry, and general household IADLs while pt performs ADLs I'ly. Reports performing fxl mobiltiy with no AD at baseline, but states, "I should probably use a walker"        OT Problem List: Decreased strength;Decreased range of motion;Decreased activity tolerance;Impaired balance (sitting and/or standing);Decreased knowledge of use of DME or AE      OT Treatment/Interventions: Self-care/ADL training;Therapeutic exercise;DME and/or AE instruction;Therapeutic activities;Patient/family education;Balance training    OT Goals(Current goals can be found in the care plan section) Acute Rehab OT Goals Patient Stated Goal: "To get to walk around more and improve my balance" OT Goal Formulation: With patient Time For Goal Achievement: 08/30/19 Potential to Achieve Goals: Good  OT Frequency: Min 2X/week   Barriers to D/C:            Co-evaluation              AM-PAC OT "6 Clicks" Daily Activity     Outcome Measure Help from another person eating meals?: None Help  from another person taking care of personal grooming?: None Help from another person toileting, which includes using toliet, bedpan, or urinal?: A Little Help from another person bathing (including washing, rinsing, drying)?: A Little Help from another person to put on and taking off regular upper body clothing?: None Help from another person to put on and taking off regular lower body clothing?: A Little 6 Click Score: 21   End of Session Equipment Utilized During Treatment: Gait belt;Rolling walker Nurse Communication: Mobility status  Activity Tolerance: Patient tolerated treatment well Patient left: in chair;with call bell/phone within reach;with chair alarm set  OT Visit Diagnosis: Unsteadiness on feet (R26.81);Muscle weakness (generalized) (M62.81)  Time: [id number] OT Time Calculation (min): 57 min Charges:  OT General Charges $OT Visit: 1 Visit OT Evaluation $OT Eval Moderate Complexity: 1 Mod OT Treatments $Self Care/Home Management : 23-37 mins $Therapeutic Activity: 23-37 mins  Gerrianne Scale, MS, OTR/L ascom [phone] 08/16/19, 5:22 PM

## 2019-08-16 NOTE — TOC Progression Note (Signed)
Transition of Care [facility]) - Progression Note    Patient Details  Name: Danny Cook. MRN: [id number] Date of Birth: 1961-01-24  Transition of Care [facility]) CM/SW Contact  Salvatore Poe, Gardiner Rhyme, LCSW Phone Number: 08/16/2019, 2:50 PM  Clinical Narrative:   Met with pt to ask if nursing has gotten him up or if he has ambulated today. He reports he has been in the bed. Have asked MD for PT eval due to pt was ambulating at the [facility] and will need to to be able to return back there. She has written an order for PT to see him tomorrow. He reports he feels better today.     Expected Discharge Plan: Group Home Barriers to Discharge: Continued Medical Work up  Expected Discharge Plan and Services Expected Discharge Plan: Group Home In-house Referral: Clinical Social Work     Living arrangements for the past 2 months: Group Home                                       Social Determinants of Health (SDOH) Interventions    Readmission Risk Interventions Readmission Risk Prevention Plan 03/27/2018  Transportation Screening Complete  Medication Review Press photographer) Complete  PCP or Specialist appointment within 3-5 days of discharge Complete  HRI or Home Care Consult Complete  SW Recovery Care/Counseling Consult Complete  Palliative Care Screening Not Complete  Medication Reconcilation (Pharmacy) Complete  [place] Not Complete  Some recent data might be hidden

## 2019-08-16 NOTE — Progress Notes (Signed)
PHARMACIST - PHYSICIAN COMMUNICATION DR:  Jimmye Norman CONCERNING: Antibiotic IV to Oral Route Change Policy  RECOMMENDATION: This patient is receiving metronidazole by the intravenous route.  Based on criteria approved by the Pharmacy and Therapeutics Committee, the antibiotic(s) is/are being converted to the equivalent oral dose form(s).   DESCRIPTION: These criteria include:  Patient being treated for a respiratory tract infection, urinary tract infection, cellulitis or clostridium difficile associated diarrhea if on metronidazole  The patient is not neutropenic and does not exhibit a GI malabsorption state  The patient is eating (either orally or via tube) and/or has been taking other orally administered medications for a least 24 hours  The patient is improving clinically and has a Tmax < 100.5  If you have questions about this conversion, please contact the [place], PharmD, BCPS Clinical Pharmacist 08/16/2019 1:54 PM

## 2019-08-16 NOTE — Progress Notes (Signed)
Pharmacy Antibiotic Note  Danny Cook. is a 59 y.o. male admitted on 08/14/2019 with sepsis.  Pharmacy has been consulted for Cefepime and Vancomycin dosing.  Vancomycin dosing held per rising Scr 1.11>1.32  Checked random level to ensure clearance  Vanc Random returned 7 ug/ml, will resume dosing at a lower rate and recheck Scr with am labs  Plan: Change to  Vancomycin 1750 mg IV Q 24 hrs. Goal AUC 400-550. Expected AUC: 490 SCr used: 1.32 Expected Cmin: 11.6  Continue Cefepime 2g IV q8h   Height: 5\' 7"  (170.2 cm) Weight: 216 lb (98 kg) IBW/kg (Calculated) : 66.1  Temp (24hrs), Avg:99 F (37.2 C), Min:98.6 F (37 C), Max:99.6 F (37.6 C)  Recent Labs  Lab 08/14/19 2213 08/15/19 0008 08/15/19 0505 08/16/19 0354 08/16/19 1214  WBC 10.4  --  12.4* 9.8  --   CREATININE 1.15  --  1.11 1.32*  --   LATICACIDVEN 2.4* 1.7  --   --   --   VANCORANDOM  --   --   --   --  7    Estimated Creatinine Clearance: 66.4 mL/min (A) (by C-G formula based on SCr of 1.32 mg/dL (H)).    Allergies  Allergen Reactions  . Trazodone And Nefazodone     Affects other meds that they dont work  . Trazodone Hcl Other (See Comments)  . Viread [Tenofovir Disoproxil]     Damages kidneys  . Etodolac Rash    Antimicrobials this admission: Cefepime 2/14 >>  Metronidazole 2/15 >>  Vancomycin 2/15 >>  Dose adjustments this admission: 2/16 - held 10 am dose to check level per aki and changed dose from 2000mg  q24 to 1750 q24  Microbiology results:  Thank you for allowing pharmacy to be a part of this patient's care.  Lu Duffel, PharmD, BCPS Clinical Pharmacist 08/16/2019 1:50 PM

## 2019-08-17 DIAGNOSIS — Z21 Asymptomatic human immunodeficiency virus [HIV] infection status: Secondary | ICD-10-CM

## 2019-08-17 DIAGNOSIS — J189 Pneumonia, unspecified organism: Secondary | ICD-10-CM

## 2019-08-17 LAB — CBC
HCT: 30.3 % — ABNORMAL LOW (ref 39.0–52.0)
Hemoglobin: 10.1 g/dL — ABNORMAL LOW (ref 13.0–17.0)
MCH: 31.3 pg (ref 26.0–34.0)
MCHC: 33.3 g/dL (ref 30.0–36.0)
MCV: 93.8 fL (ref 80.0–100.0)
Platelets: 152 10*3/uL (ref 150–400)
RBC: 3.23 MIL/uL — ABNORMAL LOW (ref 4.22–5.81)
RDW: 13.2 % (ref 11.5–15.5)
WBC: 8.9 10*3/uL (ref 4.0–10.5)
nRBC: 0 % (ref 0.0–0.2)

## 2019-08-17 LAB — BASIC METABOLIC PANEL
Anion gap: 8 (ref 5–15)
BUN: 14 mg/dL (ref 6–20)
CO2: 24 mmol/L (ref 22–32)
Calcium: 8.4 mg/dL — ABNORMAL LOW (ref 8.9–10.3)
Chloride: 107 mmol/L (ref 98–111)
Creatinine, Ser: 1.1 mg/dL (ref 0.61–1.24)
GFR calc Af Amer: >60 mL/min (ref >60)
GFR calc non Af Amer: >60 mL/min (ref >60)
Glucose, Bld: 125 mg/dL — ABNORMAL HIGH (ref 70–99)
Potassium: 3.8 mmol/L (ref 3.5–5.1)
Sodium: 139 mmol/L (ref 135–145)

## 2019-08-17 LAB — GLUCOSE, CAPILLARY
Glucose-Capillary: 94 mg/dL (ref 70–99)
Glucose-Capillary: 109 mg/dL — ABNORMAL HIGH (ref 70–99)
Glucose-Capillary: 136 mg/dL — ABNORMAL HIGH (ref 70–99)
Glucose-Capillary: 105 mg/dL — ABNORMAL HIGH (ref 70–99)
Glucose-Capillary: 99 mg/dL (ref 70–99)

## 2019-08-17 LAB — MRSA PCR SCREENING: MRSA by PCR: POSITIVE — AB

## 2019-08-17 MED ORDER — CHLORHEXIDINE GLUCONATE CLOTH 2 % EX PADS
6.0000 | MEDICATED_PAD | Freq: Every day | CUTANEOUS | Status: DC
  Administered 2019-08-18: 07:00:00 6 via TOPICAL

## 2019-08-17 MED ORDER — MUPIROCIN 2 % EX OINT
1.0000 "application " | TOPICAL_OINTMENT | Freq: Two times a day (BID) | CUTANEOUS | Status: DC
  Administered 2019-08-18 – 2019-08-19 (×4): 1 via NASAL
  Filled 2019-08-17: qty 22

## 2019-08-17 NOTE — Progress Notes (Signed)
PROGRESS NOTE    Danny Cook.  ON:[id number] DOB: 1960-10-09 DOA: 08/14/2019 PCP: Tracie Harrier, MD    Brief Narrative:  Danny Cook. is a 59 y.o. male with medical history significant for HIV, schizophrenia, diabetes, chronic diastolic heart failure, presented to the emergency room with a 2-day history of muscle aches, generalized malaise and 1 day history of fever.  Patient received a second Covid shot a week ago.  EMS reported a temperature of 105 for which he got Tylenol.  He has a mild cough.  Denies shortness of breath.     Consultants:   none  Procedures: None   Antimicrobials:   Cefepime, vanco, metronidazole   Subjective: Starting to feel better. Less sob but not at baseline. No cough. No other complaints  Objective: Vitals:   08/16/19 0750 08/16/19 1526 08/16/19 2332 08/17/19 0752  BP: 137/71 117/75 115/68 123/70  Pulse: 89 62 (!) 57 67  Resp: 18 17 17 17   Temp: 98.7 F (37.1 C) 98.2 F (36.8 C) 98.2 F (36.8 C) 98.7 F (37.1 C)  TempSrc: Oral Oral Oral Oral  SpO2: 95% 96% 95% 96%  Weight:      Height:        Intake/Output Summary (Last 24 hours) at 08/17/2019 1225 Last data filed at 08/17/2019 0901 Gross per 24 hour  Intake 4739.13 ml  Output 1250 ml  Net 3489.13 ml   Filed Weights   08/14/19 2207  Weight: 98 kg    Examination:  General exam: Appears calm and comfortable  Respiratory system: CTA with decrease bs sounds, no w/r Cardiovascular system: S1 & S2 heard, RRR. No JVD, murmurs, rubs, gallops or clicks.  Gastrointestinal system: Abdomen is nondistended, soft and nontender. Normal bowel sounds heard. Central nervous system: Alert and oriented. Grossly intact Extremities: no edema Skin: warm, dry Psychiatry:  Mood & affect appropriate in current setting.     Data Reviewed: I have personally reviewed following labs and imaging studies  CBC: Recent Labs  Lab 08/14/19 2213 08/15/19 0505 08/16/19 0354  08/17/19 0431  WBC 10.4 12.4* 9.8 8.9  NEUTROABS 7.9*  --   --   --   HGB 11.1* 11.8* 10.0* 10.1*  HCT 32.8* 35.6* 30.1* 30.3*  MCV 91.9 95.2 93.8 93.8  PLT 190 180 168 0000000   Basic Metabolic Panel: Recent Labs  Lab 08/14/19 2213 08/15/19 0505 08/16/19 0354 08/17/19 0431  NA 141 143 142 139  K 3.1* 3.4* 3.2* 3.8  CL 107 110 109 107  CO2 25 25 23 24   GLUCOSE 78 116* 106* 125*  BUN 14 13 13 14   CREATININE 1.15 1.11 1.32* 1.10  CALCIUM 8.6* 8.3* 8.3* 8.4*   GFR: Estimated Creatinine Clearance: 79.7 mL/min (by C-G formula based on SCr of 1.1 mg/dL). Liver Function Tests: Recent Labs  Lab 08/14/19 2213 08/15/19 0505  AST 15 18  ALT 11 10  ALKPHOS 77 76  BILITOT 0.4 0.6  PROT 6.7 6.7  ALBUMIN 3.4* 3.4*   No results for input(s): LIPASE, AMYLASE in the last 168 hours. No results for input(s): AMMONIA in the last 168 hours. Coagulation Profile: Recent Labs  Lab 08/14/19 2213 08/15/19 0505  INR 1.1 1.0   Cardiac Enzymes: No results for input(s): CKTOTAL, CKMB, CKMBINDEX, TROPONINI in the last 168 hours. BNP (last 3 results) No results for input(s): PROBNP in the last 8760 hours. HbA1C: Recent Labs    08/14/19 2213  HGBA1C 5.6   CBG: Recent Labs  Lab 08/16/19 1143 08/16/19 1634 08/16/19 2106 08/17/19 0844 08/17/19 1131  GLUCAP 94 113* 122* 109* 136*   Lipid Profile: No results for input(s): CHOL, HDL, LDLCALC, TRIG, CHOLHDL, LDLDIRECT in the last 72 hours. Thyroid Function Tests: No results for input(s): TSH, T4TOTAL, FREET4, T3FREE, THYROIDAB in the last 72 hours. Anemia Panel: No results for input(s): VITAMINB12, FOLATE, FERRITIN, TIBC, IRON, RETICCTPCT in the last 72 hours. Sepsis Labs: Recent Labs  Lab 08/14/19 2213 08/15/19 0008 08/15/19 0505  PROCALCITON 0.10  --  <0.10  LATICACIDVEN 2.4* 1.7  --     Recent Results (from the past 240 hour(s))  Respiratory Panel by RT PCR (Flu A&B, Covid) - Nasopharyngeal Swab     Status: None    Collection Time: 08/14/19 12:05 AM   Specimen: Nasopharyngeal Swab  Result Value Ref Range Status   SARS Coronavirus 2 by RT PCR NEGATIVE NEGATIVE Final    Comment: (NOTE) SARS-CoV-2 target nucleic acids are NOT DETECTED. The SARS-CoV-2 RNA is generally detectable in upper respiratoy specimens during the acute phase of infection. The lowest concentration of SARS-CoV-2 viral copies this assay can detect is 131 copies/mL. A negative result does not preclude SARS-Cov-2 infection and should not be used as the sole basis for treatment or other patient management decisions. A negative result may occur with  improper specimen collection/handling, submission of specimen other than nasopharyngeal swab, presence of viral mutation(s) within the areas targeted by this assay, and inadequate number of viral copies (<131 copies/mL). A negative result must be combined with clinical observations, patient history, and epidemiological information. The expected result is Negative. Fact Sheet for Patients:  PinkCheek.be Fact Sheet for Healthcare Providers:  GravelBags.it This test is not yet ap proved or cleared by the [place] FDA and  has been authorized for detection and/or diagnosis of SARS-CoV-2 by FDA under an Emergency Use Authorization (EUA). This EUA will remain  in effect (meaning this test can be used) for the duration of the COVID-19 declaration under Section 564(b)(1) of the Act, 21 U.S.C. section 360bbb-3(b)(1), unless the authorization is terminated or revoked sooner.    Influenza A by PCR NEGATIVE NEGATIVE Final   Influenza B by PCR NEGATIVE NEGATIVE Final    Comment: (NOTE) The Xpert Xpress SARS-CoV-2/FLU/RSV assay is intended as an aid in  the diagnosis of influenza from Nasopharyngeal swab specimens and  should not be used as a sole basis for treatment. Nasal washings and  aspirates are unacceptable for Xpert Xpress  SARS-CoV-2/FLU/RSV  testing. Fact Sheet for Patients: PinkCheek.be Fact Sheet for Healthcare Providers: GravelBags.it This test is not yet approved or cleared by the [place] FDA and  has been authorized for detection and/or diagnosis of SARS-CoV-2 by  FDA under an Emergency Use Authorization (EUA). This EUA will remain  in effect (meaning this test can be used) for the duration of the  Covid-19 declaration under Section 564(b)(1) of the Act, 21  U.S.C. section 360bbb-3(b)(1), unless the authorization is  terminated or revoked. Performed at [facility], [address] [place], [place] [address]   Culture, blood (Routine x 2)     Status: None (Preliminary result)   Collection Time: 08/14/19 10:13 PM   Specimen: BLOOD  Result Value Ref Range Status   Specimen Description BLOOD RIGHT ARM  Final   Special Requests   Final    BOTTLES DRAWN AEROBIC AND ANAEROBIC Blood Culture adequate volume   Culture   Final    NO GROWTH 3 DAYS Performed  at [facility], [address] [place], [place] [address]    Report Status PENDING  Incomplete  Culture, blood (Routine x 2)     Status: None (Preliminary result)   Collection Time: 08/14/19 10:13 PM   Specimen: BLOOD  Result Value Ref Range Status   Specimen Description BLOOD RIGHT ANTECUBITAL  Final   Special Requests   Final    BOTTLES DRAWN AEROBIC AND ANAEROBIC Blood Culture adequate volume   Culture   Final    NO GROWTH 3 DAYS Performed at [facility], [address], [place], [place] [address]    Report Status PENDING  Incomplete  Urine culture     Status: Abnormal   Collection Time: 08/14/19 10:13 PM   Specimen: Urine, Random  Result Value Ref Range Status   Specimen Description   Final    URINE, RANDOM Performed at [facility], [address], [place], [place] [address]    Special Requests   Final    NONE Performed at  [facility], [address], [place], [place] [address]    Culture (A)  Final    <10,000 COLONIES/mL INSIGNIFICANT GROWTH Performed at [facility], [place] [address] [place],  [address]    Report Status 08/15/2019 FINAL  Final         Radiology Studies: No results found.      Scheduled Meds: . abacavir-dolutegravir-lamiVUDine  1 tablet Oral QHS  . aspirin  325 mg Oral Daily  . diphenhydrAMINE  50 mg Oral QHS  . divalproex  500 mg Oral Daily  . donepezil  5 mg Oral QHS  . enoxaparin (LOVENOX) injection  40 mg Subcutaneous Q24H  . ferrous sulfate  325 mg Oral Q breakfast  . gabapentin  300 mg Oral TID  . gemfibrozil  600 mg Oral BID AC  . haloperidol  5 mg Oral BID  . insulin aspart  0-15 Units Subcutaneous TID WC  . insulin aspart  0-5 Units Subcutaneous QHS  . losartan  25 mg Oral Daily  . memantine  10 mg Oral BID  . metoprolol tartrate  12.5 mg Oral BID  . metroNIDAZOLE  500 mg Oral Q8H  . venlafaxine XR  75 mg Oral Daily   Continuous Infusions: . sodium chloride 20 mL/hr at 08/17/19 0400  . ceFEPime (MAXIPIME) IV 2 g (08/17/19 1022)  . dextrose 50 mL/hr at 08/17/19 0919  . vancomycin Stopped (08/16/19 1611)    Assessment & Plan:   Principal Problem:   Sepsis ([place]) Active Problems:   Chronic diastolic heart failure (HCC)   HTN (hypertension)   Diabetes (HCC)   Orthostatic dizziness   HIV positive (HCC)   FUO (fever of unknown origin)  Suspectsepsis: possibly secondary to pneumonia. Afebrile today, clinically slowly improving.  Hemodynamically stable.  COVID19 PCR neg. Influenza neg. Urine cx shows insignificant growth, blood cx NGTD.  Continue on IV cefepime, vancomycin and metronidazole for now Ck MRSA pcr, if negative , d/c vanco. D/c IVF  Possible pneumonia: b/l as per CT.  Continue on IV abxs as above Pro-cal 0.10.  Continue on bronchodilators.  Encourage incentive spirometry  Leukocytosis: 2/2 sepsis. Now resolved    Hypokalemia: KCl repleated, now resolved Continue to monitor  Chronic diastolic heart failure:Euvolemic  Continue losartan and metoprolol. Hold for MAP <65  Chronic back pain/DDD: scheduled for back surgery in March 2021  HTN: continue losartan and metoprolol. Hold for MAP <65  DM2: continue to hold home  po DM meds. Continue on SSI w/ accuchecks  Normocytic anemia: no need for a transfusion at this time. Will continue to monitor H&H  HIV: continue on home dose of abacavir/dolutegravir/lamivudine   Schizophrenia: continue on home dose of haldol, depakote    DVT prophylaxis: lovenox Code Status:full  Family Communication: none at bedside Disposition Plan: Still not at baseline from clinical stand point. Continue with IV abx. Hopefully if clinically improves d/c in 1-2 days. Needs home PT/OT       LOS: 2 days   Time spent: 45 min with >50 % on coc    Nolberto Hanlon, MD [facility] Pager [phone]  If 7PM-7AM, please contact night-coverage [email or web address] Password [facility] 08/17/2019, 12:25 PM

## 2019-08-17 NOTE — TOC Progression Note (Addendum)
Transition of Care [facility]) - Progression Note    Patient Details  Name: Danny Cook. MRN: [id number] Date of Birth: [birth year]  Transition of [facility]) CM/SW Contact  Dellamae Rosamilia, Gardiner Rhyme, LCSW Phone Number: 08/17/2019, 1:39 PM  Clinical Narrative:  Spoke with pt have ordered a rolling walker and it is in the room for him to take to the [facility] when discharged. Spoke with Katharine Look at [facility] to let her know of possible discharge in the next two days. Asked about pt's need for PT & OT follow up. She wanted this worker to make referral and had no pref of agency. Have made referral to [facility] for this follow up. Katharine Look asked worker to have pt call her and they can arrange transportation back when ready. Will let pt know. Continue to work on discharge plans. Pt reports Lwanda Ray-owner of the [facility] will transport him back there once ready for DC    Expected Discharge Plan: Group Home Barriers to Discharge: Continued Medical Work up  Expected Discharge Plan and Services Expected Discharge Plan: Group Home In-house Referral: Clinical Social Work     Living arrangements for the past 2 months: Group Home                                       Social Determinants of Health (SDOH) Interventions    Readmission Risk Interventions Readmission Risk Prevention Plan 03/27/2018  Transportation Screening Complete  Medication Review Press photographer) Complete  PCP or Specialist appointment within 3-5 days of discharge Complete  HRI or Home Care Consult Complete  SW Recovery Care/Counseling Consult Complete  Palliative Care Screening Not Complete  Medication Reconcilation (Pharmacy) Complete  [place] Not Complete  Some recent data might be hidden

## 2019-08-17 NOTE — Progress Notes (Signed)
A pleasant visit with Danny Cook. He has an added diagnosis today he must be treated for. We talked for a period. He is anxious wanting to go home. He requested prayer, I prayed for him and assured him I would check back on him.

## 2019-08-17 NOTE — Progress Notes (Signed)
Physical Therapy Treatment Patient Details Name: Danny Cook. MRN: [id number] DOB: 1961-06-05 Today's Date: 08/17/2019    History of Present Illness Pt is 59 y/o M with PMH: dCHF, HTN, DM, HIV, paranoia, schizophrenia, and sleep apnea. Pt presented with fever which is now trending down. COVID19 neg. Influenza neg. Urine cx shows insignificant growth, blood cx NGTD. Suspect sepsis 2/2 PNA.    PT Comments    Pt ready to walk.  Stood and was able to walk 1 1/2 laps around unit with RW and min guard.  Generally steady.  Participated in exercises as described below.   Follow Up Recommendations  Home health PT     Equipment Recommendations  Rolling walker with 5" wheels    Recommendations for Other Services       Precautions / Restrictions Precautions Precautions: Fall Restrictions Weight Bearing Restrictions: No    Mobility  Bed Mobility Overal bed mobility: Needs Assistance Bed Mobility: Supine to Sit     Supine to sit: Supervision     General bed mobility comments: in reclienr before and after  Transfers Overall transfer level: Needs assistance Equipment used: Rolling walker (2 wheeled) Transfers: Sit to/from Stand Sit to Stand: Min guard         General transfer comment: cues for pushing from seated surface in order to come to stand. Once standing, upright posture noted.   Ambulation/Gait Ambulation/Gait assistance: Min guard Gait Distance (Feet): 300 Feet Assistive device: Rolling walker (2 wheeled) Gait Pattern/deviations: Step-through pattern Gait velocity: good   General Gait Details: generally steady with no LOB or excessive fatigue   Stairs             Wheelchair Mobility    Modified Rankin (Stroke Patients Only)       Balance Overall balance assessment: Needs assistance Sitting-balance support: Feet supported;No upper extremity supported Sitting balance-Leahy Scale: Normal     Standing balance support: Bilateral upper  extremity supported Standing balance-Leahy Scale: Fair Standing balance comment: CGA with RW for support in standing                            Cognition Arousal/Alertness: Awake/alert Behavior During Therapy: WFL for tasks assessed/performed Overall Cognitive Status: Within Functional Limits for tasks assessed                                        Exercises Other Exercises Other Exercises: seated ankle pumps LAQ and marches x 10 Other Exercises: Ambulated to bathroom. Due to obstacles, able to side step over to toilet to sit down. Uses railing for transfer. Min assist to stand back up from lower surface. Able to perform hygiene independently Other Exercises: Supine/seated ther-ex performed including B LE AP, SLR, and LAQ. All ther-ex performed x 10 reps with supervision. Complains of R knee pain.    General Comments        Pertinent Vitals/Pain Pain Assessment: No/denies pain Faces Pain Scale: Hurts little more Pain Location: R knee and lower back Pain Descriptors / Indicators: Aching;Dull Pain Intervention(s): Limited activity within patient's tolerance;Repositioned    Home Living Family/patient expects to be discharged to:: Group home             Home Equipment: Shower seat Additional Comments: level entry in front, 4 STE in back-takes this route to empty his trash.    Prior  Function Level of Independence: Independent      Comments: reports he has had 1 recent fall and is pending back surgery next month   PT Goals (current goals can now be found in the care plan section) Acute Rehab PT Goals Patient Stated Goal: "To get to walk around more and improve my balance" PT Goal Formulation: With patient Time For Goal Achievement: 08/31/19 Potential to Achieve Goals: Good Progress towards PT goals: Progressing toward goals    Frequency    Min 2X/week      PT Plan Current plan remains appropriate    Co-evaluation               AM-PAC PT "6 Clicks" Mobility   Outcome Measure  Help needed turning from your back to your side while in a flat bed without using bedrails?: None Help needed moving from lying on your back to sitting on the side of a flat bed without using bedrails?: None Help needed moving to and from a bed to a chair (including a wheelchair)?: A Little Help needed standing up from a chair using your arms (e.g., wheelchair or bedside chair)?: A Little Help needed to walk in hospital room?: A Little Help needed climbing 3-5 steps with a railing? : A Little 6 Click Score: 20    End of Session Equipment Utilized During Treatment: Gait belt Activity Tolerance: Patient tolerated treatment well Patient left: in chair;with chair alarm set Nurse Communication: Mobility status PT Visit Diagnosis: Unsteadiness on feet (R26.81);Muscle weakness (generalized) (M62.81);Difficulty in walking, not elsewhere classified (R26.2)     Time: [id number] PT Time Calculation (min) (ACUTE ONLY): 9 min  Charges:  $Gait Training: 8-22 mins $Therapeutic Exercise: 8-22 mins                     Chesley Noon, PTA 08/17/19, 11:16 AM

## 2019-08-17 NOTE — Evaluation (Signed)
Physical Therapy Evaluation Patient Details Name: Danny Cook. MRN: [id number] DOB: 28-Nov-1960 Today's Date: 08/17/2019   History of Present Illness  Pt is 59 y/o M with PMH: dCHF, HTN, DM, HIV, paranoia, schizophrenia, and sleep apnea. Pt presented with fever which is now trending down. COVID19 neg. Influenza neg. Urine cx shows insignificant growth, blood cx NGTD. Suspect sepsis 2/2 PNA.  Clinical Impression  Pt is a pleasant 59 year old male who was admitted for suspected sepsis with fever of unknown orgin. Pt performs bed mobility with supervision, transfers with cga and ambulation with cga and RW. Pt demonstrates deficits with strength/mobility/endurance. Encouraged and educated on use of RW to improve balance and decrease falls risk. Pt is close to baseline level. Would benefit from skilled PT to address above deficits and promote optimal return to PLOF. Recommend transition to [place] upon discharge from acute hospitalization.     Follow Up Recommendations Home health PT    Equipment Recommendations  Rolling walker with 5" wheels    Recommendations for Other Services       Precautions / Restrictions Precautions Precautions: Fall Restrictions Weight Bearing Restrictions: No      Mobility  Bed Mobility Overal bed mobility: Needs Assistance Bed Mobility: Supine to Sit     Supine to sit: Supervision     General bed mobility comments: needs cues for sequencing including log roll technique. Once seated at EOB, able to sit with upright posture  Transfers Overall transfer level: Needs assistance Equipment used: Rolling walker (2 wheeled) Transfers: Sit to/from Stand Sit to Stand: Min guard         General transfer comment: cues for pushing from seated surface in order to come to stand. Once standing, upright posture noted.   Ambulation/Gait Ambulation/Gait assistance: Min guard Gait Distance (Feet): 200 Feet Assistive device: Rolling walker (2  wheeled) Gait Pattern/deviations: Step-through pattern     General Gait Details: ambulated with reciprocal gait pattern using RW. Cues for keeping close to RW demonstrating decreased step length on R LE. Slight fatigue noted.   Stairs            Wheelchair Mobility    Modified Rankin (Stroke Patients Only)       Balance Overall balance assessment: Needs assistance Sitting-balance support: Feet supported;No upper extremity supported Sitting balance-Leahy Scale: Normal     Standing balance support: Bilateral upper extremity supported Standing balance-Leahy Scale: Fair                               Pertinent Vitals/Pain Pain Assessment: Faces Faces Pain Scale: Hurts little more Pain Location: R knee and lower back Pain Descriptors / Indicators: Aching;Dull Pain Intervention(s): Limited activity within patient's tolerance;Repositioned    Home Living Family/patient expects to be discharged to:: Group home               Home Equipment: Shower seat Additional Comments: level entry in front, 4 STE in back-takes this route to empty his trash.    Prior Function Level of Independence: Independent         Comments: reports he has had 1 recent fall and is pending back surgery next month     Hand Dominance        Extremity/Trunk Assessment   Upper Extremity Assessment Upper Extremity Assessment: Defer to OT evaluation    Lower Extremity Assessment Lower Extremity Assessment: Generalized weakness(B LE grossly 4+/5)  Communication   Communication: No difficulties  Cognition Arousal/Alertness: Awake/alert Behavior During Therapy: WFL for tasks assessed/performed Overall Cognitive Status: Within Functional Limits for tasks assessed                                        General Comments      Exercises Other Exercises Other Exercises: Ambulated to bathroom. Due to obstacles, able to side step over to toilet to sit  down. Uses railing for transfer. Min assist to stand back up from lower surface. Able to perform hygiene independently Other Exercises: Supine/seated ther-ex performed including B LE AP, SLR, and LAQ. All ther-ex performed x 10 reps with supervision. Complains of R knee pain.   Assessment/Plan    PT Assessment Patient needs continued PT services  PT Problem List Decreased strength;Decreased balance;Decreased mobility       PT Treatment Interventions Gait training;Therapeutic exercise;Balance training    PT Goals (Current goals can be found in the Care Plan section)  Acute Rehab PT Goals Patient Stated Goal: "To get to walk around more and improve my balance" PT Goal Formulation: With patient Time For Goal Achievement: 08/31/19 Potential to Achieve Goals: Good    Frequency Min 2X/week   Barriers to discharge        Co-evaluation               AM-PAC PT "6 Clicks" Mobility  Outcome Measure Help needed turning from your back to your side while in a flat bed without using bedrails?: None Help needed moving from lying on your back to sitting on the side of a flat bed without using bedrails?: None Help needed moving to and from a bed to a chair (including a wheelchair)?: A Little Help needed standing up from a chair using your arms (e.g., wheelchair or bedside chair)?: A Little Help needed to walk in hospital room?: A Little Help needed climbing 3-5 steps with a railing? : A Little 6 Click Score: 20    End of Session Equipment Utilized During Treatment: Gait belt Activity Tolerance: Patient tolerated treatment well Patient left: in chair;with chair alarm set Nurse Communication: Mobility status PT Visit Diagnosis: Unsteadiness on feet (R26.81);Muscle weakness (generalized) (M62.81);Difficulty in walking, not elsewhere classified (R26.2)    Time: [id number] PT Time Calculation (min) (ACUTE ONLY): 23 min   Charges:   PT Evaluation $PT Eval Moderate Complexity: 1 Mod PT  Treatments $Therapeutic Exercise: 8-22 mins        Greggory Stallion, PT, DPT [phone]   Latonyia Lopata 08/17/2019, 10:43 AM

## 2019-08-17 NOTE — Progress Notes (Signed)
Occupational Therapy Treatment Patient Details Name: Danny Cook. MRN: [id number] DOB: 20-Feb-1961 Today's Date: 08/17/2019    History of present illness Pt is 59 y/o M with PMH: dCHF, HTN, DM, HIV, paranoia, schizophrenia, and sleep apnea. Pt presented with fever which is now trending down. COVID19 neg. Influenza neg. Urine cx shows insignificant growth, blood cx NGTD. Suspect sepsis 2/2 PNA.   OT comments  Patient seen this afternoon for occupational therapy session.  Patient agreeable to session.  Educated patient on differences between PT and OT and specific OT goals while in the acute setting.  Educated patient that therapy is recommending home health at this time.  Patient agreeable to this.  Performed UB ther-ex to address strengthening in order to improve safety during ADLs and functional  mobility (please see below for details).  Patient tolerated well but required cues from therapist to maintain attention to task.  Based on today's performance, d/c recommendations remain appropriate.  Pt continues to require skilled OT services to address strengthening, balance, activity tolerance, safety and ADL retraining.    Follow Up Recommendations  Home health OT;Supervision - Intermittent    Equipment Recommendations  3 in 1 bedside commode;Other (comment)    Recommendations for Other Services      Precautions / Restrictions Precautions Precautions: Fall Restrictions Weight Bearing Restrictions: No       Mobility Bed Mobility   Transfers     Balance                            ADL either performed or assessed with clinical judgement   ADL                                         General ADL Comments: Set up to complete simple grooming while seated in recliner.     Vision       Perception     Praxis      Cognition Arousal/Alertness: Awake/alert Behavior During Therapy: WFL for tasks assessed/performed Overall Cognitive  Status: Within Functional Limits for tasks assessed                                 General Comments: some slow speech, but generally good processing and is A&O x4        Exercises Exercises: Other exercises General Exercises - Upper Extremity Elbow Extension: Strengthening;AROM;Both;Seated;10 reps;Theraband(green theraband) Theraband Level (Elbow Extension): Level 3 (Green) Other Exercises Other Exercises: Completed B UE strengthening in elbow extension/triceps, shoulder retraction/scapular adduction/rows, and shoulder horizontal abd/add.   2 sets of 10 with therapist supervision on body mechanics and pacing. Other Exercises: Provided education on differences between physical therapy and occupational therapy. Other Exercises: Ambulated to bathroom. Due to obstacles, able to side step over to toilet to sit down. Uses railing for transfer. Min assist to stand back up from lower surface. Able to perform hygiene independently Other Exercises: Supine/seated ther-ex performed including B LE AP, SLR, and LAQ. All ther-ex performed x 10 reps with supervision. Complains of R knee pain.   Shoulder Instructions       General Comments      Pertinent Vitals/ Pain       Pain Assessment: No/denies pain   Home Living Family/patient expects to be discharged to:: Group home  Home Equipment: Shower seat   Additional Comments: level entry in front, 4 STE in back-takes this route to empty his trash.      Prior Functioning/Environment Level of Independence: Independent        Comments: reports he has had 1 recent fall and is pending back surgery next month   Frequency  Min 2X/week        Progress Toward Goals  OT Goals(current goals can now be found in the care plan section)     Acute Rehab OT Goals Patient Stated Goal: "To get to walk around more and improve my balance"  Plan      Co-evaluation                 AM-PAC  OT "6 Clicks" Daily Activity     Outcome Measure                    End of Session    OT Visit Diagnosis: Unsteadiness on feet (R26.81);Muscle weakness (generalized) (M62.81)   Activity Tolerance Patient tolerated treatment well   Patient Left in chair;with call bell/phone within reach;with chair alarm set;Other (comment)(Social services in room)   Nurse Communication          Time: 1340-1405 OT Time Calculation (min): 25 min  Charges: OT General Charges $OT Visit: 1 Visit OT Treatments $Therapeutic Exercise: 23-37 mins  Baldomero Lamy, MS, OTR/L 08/17/19, 2:30 PM

## 2019-08-18 DIAGNOSIS — I10 Essential (primary) hypertension: Secondary | ICD-10-CM

## 2019-08-18 LAB — GLUCOSE, CAPILLARY
Glucose-Capillary: 124 mg/dL — ABNORMAL HIGH (ref 70–99)
Glucose-Capillary: 134 mg/dL — ABNORMAL HIGH (ref 70–99)
Glucose-Capillary: 69 mg/dL — ABNORMAL LOW (ref 70–99)
Glucose-Capillary: 112 mg/dL — ABNORMAL HIGH (ref 70–99)
Glucose-Capillary: 82 mg/dL (ref 70–99)

## 2019-08-18 LAB — CREATININE, SERUM
Creatinine, Ser: 1.22 mg/dL (ref 0.61–1.24)
GFR calc Af Amer: >60 mL/min (ref >60)
GFR calc non Af Amer: >60 mL/min (ref >60)

## 2019-08-18 LAB — SARS CORONAVIRUS 2 (TAT 6-24 HRS): SARS Coronavirus 2: NEGATIVE

## 2019-08-18 MED ORDER — SODIUM CHLORIDE 0.45 % IV SOLN: INTRAVENOUS | Status: DC

## 2019-08-18 NOTE — Progress Notes (Signed)
PROGRESS NOTE    Danny Cook.  ON:[id number] DOB: Jun 17, 1961 DOA: 08/14/2019 PCP: Tracie Harrier, MD    Brief Narrative:  Danny Cook. is a 59 y.o. male with medical history significant for HIV, schizophrenia, diabetes, chronic diastolic heart failure, presented to the emergency room with a 2-day history of muscle aches, generalized malaise and 1 day history of fever.  Patient received a second Covid shot a week ago.  EMS reported a temperature of 105 for which he got Tylenol.  He has a mild cough.  Denies shortness of breath.     Consultants:   none  Procedures: None   Antimicrobials:   Cefepime, vanco, metronidazole   Subjective: Feeling better today.  His symptoms he tells me improving. No cough. Wondering when he is going "home" .  Objective: Vitals:   08/16/19 2332 08/17/19 0752 08/17/19 1604 08/17/19 2316  BP: 115/68 123/70 123/71 120/72  Pulse: (!) 57 67 (!) 59 66  Resp: 17 17 17 18   Temp: 98.2 F (36.8 C) 98.7 F (37.1 C) 98.9 F (37.2 C) 97.8 F (36.6 C)  TempSrc: Oral Oral Oral Oral  SpO2: 95% 96% 97% 94%  Weight:      Height:        Intake/Output Summary (Last 24 hours) at 08/18/2019 0729 Last data filed at 08/18/2019 0527 Gross per 24 hour  Intake 480 ml  Output 1470 ml  Net -990 ml   Filed Weights   08/14/19 2207  Weight: 98 kg    Examination:  General exam: Appears calm and comfortable, sitting in chair Respiratory system: CTA , no w/r Cardiovascular system: S1 & S2 heard, RRR. No  murmurs, rubs, gallops or clicks.  Gastrointestinal system: Abdomen is nondistended, soft and nontender. Normal bowel sounds heard. Central nervous system: Alert and oriented. Grossly intact Extremities: no edema or cyanosis Skin: warm, dry Psychiatry:  Mood & affect appropriate in current setting.     Data Reviewed: I have personally reviewed following labs and imaging studies  CBC: Recent Labs  Lab 08/14/19 2213 08/15/19  0505 08/16/19 0354 08/17/19 0431  WBC 10.4 12.4* 9.8 8.9  NEUTROABS 7.9*  --   --   --   HGB 11.1* 11.8* 10.0* 10.1*  HCT 32.8* 35.6* 30.1* 30.3*  MCV 91.9 95.2 93.8 93.8  PLT 190 180 168 0000000   Basic Metabolic Panel: Recent Labs  Lab 08/14/19 2213 08/15/19 0505 08/16/19 0354 08/17/19 0431 08/18/19 0353  NA 141 143 142 139  --   K 3.1* 3.4* 3.2* 3.8  --   CL 107 110 109 107  --   CO2 25 25 23 24   --   GLUCOSE 78 116* 106* 125*  --   BUN 14 13 13 14   --   CREATININE 1.15 1.11 1.32* 1.10 1.22  CALCIUM 8.6* 8.3* 8.3* 8.4*  --    GFR: Estimated Creatinine Clearance: 71.9 mL/min (by C-G formula based on SCr of 1.22 mg/dL). Liver Function Tests: Recent Labs  Lab 08/14/19 2213 08/15/19 0505  AST 15 18  ALT 11 10  ALKPHOS 77 76  BILITOT 0.4 0.6  PROT 6.7 6.7  ALBUMIN 3.4* 3.4*   No results for input(s): LIPASE, AMYLASE in the last 168 hours. No results for input(s): AMMONIA in the last 168 hours. Coagulation Profile: Recent Labs  Lab 08/14/19 2213 08/15/19 0505  INR 1.1 1.0   Cardiac Enzymes: No results for input(s): CKTOTAL, CKMB, CKMBINDEX, TROPONINI in the last 168 hours. BNP (  last 3 results) No results for input(s): PROBNP in the last 8760 hours. HbA1C: No results for input(s): HGBA1C in the last 72 hours. CBG: Recent Labs  Lab 08/16/19 2106 08/17/19 0844 08/17/19 1131 08/17/19 1654 08/17/19 2138  GLUCAP 122* 109* 136* 105* 99   Lipid Profile: No results for input(s): CHOL, HDL, LDLCALC, TRIG, CHOLHDL, LDLDIRECT in the last 72 hours. Thyroid Function Tests: No results for input(s): TSH, T4TOTAL, FREET4, T3FREE, THYROIDAB in the last 72 hours. Anemia Panel: No results for input(s): VITAMINB12, FOLATE, FERRITIN, TIBC, IRON, RETICCTPCT in the last 72 hours. Sepsis Labs: Recent Labs  Lab 08/14/19 2213 08/15/19 0008 08/15/19 0505  PROCALCITON 0.10  --  <0.10  LATICACIDVEN 2.4* 1.7  --     Recent Results (from the past 240 hour(s))  Respiratory  Panel by RT PCR (Flu A&B, Covid) - Nasopharyngeal Swab     Status: None   Collection Time: 08/14/19 12:05 AM   Specimen: Nasopharyngeal Swab  Result Value Ref Range Status   SARS Coronavirus 2 by RT PCR NEGATIVE NEGATIVE Final    Comment: (NOTE) SARS-CoV-2 target nucleic acids are NOT DETECTED. The SARS-CoV-2 RNA is generally detectable in upper respiratoy specimens during the acute phase of infection. The lowest concentration of SARS-CoV-2 viral copies this assay can detect is 131 copies/mL. A negative result does not preclude SARS-Cov-2 infection and should not be used as the sole basis for treatment or other patient management decisions. A negative result may occur with  improper specimen collection/handling, submission of specimen other than nasopharyngeal swab, presence of viral mutation(s) within the areas targeted by this assay, and inadequate number of viral copies (<131 copies/mL). A negative result must be combined with clinical observations, patient history, and epidemiological information. The expected result is Negative. Fact Sheet for Patients:  PinkCheek.be Fact Sheet for Healthcare Providers:  GravelBags.it This test is not yet ap proved or cleared by the [place] FDA and  has been authorized for detection and/or diagnosis of SARS-CoV-2 by FDA under an Emergency Use Authorization (EUA). This EUA will remain  in effect (meaning this test can be used) for the duration of the COVID-19 declaration under Section 564(b)(1) of the Act, 21 U.S.C. section 360bbb-3(b)(1), unless the authorization is terminated or revoked sooner.    Influenza A by PCR NEGATIVE NEGATIVE Final   Influenza B by PCR NEGATIVE NEGATIVE Final    Comment: (NOTE) The Xpert Xpress SARS-CoV-2/FLU/RSV assay is intended as an aid in  the diagnosis of influenza from Nasopharyngeal swab specimens and  should not be used as a sole basis for  treatment. Nasal washings and  aspirates are unacceptable for Xpert Xpress SARS-CoV-2/FLU/RSV  testing. Fact Sheet for Patients: PinkCheek.be Fact Sheet for Healthcare Providers: GravelBags.it This test is not yet approved or cleared by the [place] FDA and  has been authorized for detection and/or diagnosis of SARS-CoV-2 by  FDA under an Emergency Use Authorization (EUA). This EUA will remain  in effect (meaning this test can be used) for the duration of the  Covid-19 declaration under Section 564(b)(1) of the Act, 21  U.S.C. section 360bbb-3(b)(1), unless the authorization is  terminated or revoked. Performed at [facility], [address] [place], [place] [address]   Culture, blood (Routine x 2)     Status: None (Preliminary result)   Collection Time: 08/14/19 10:13 PM   Specimen: BLOOD  Result Value Ref Range Status   Specimen Description BLOOD RIGHT ARM  Final   Special Requests  Final    BOTTLES DRAWN AEROBIC AND ANAEROBIC Blood Culture adequate volume   Culture   Final    NO GROWTH 3 DAYS Performed at [facility], [address] [place], [place] [address]    Report Status PENDING  Incomplete  Culture, blood (Routine x 2)     Status: None (Preliminary result)   Collection Time: 08/14/19 10:13 PM   Specimen: BLOOD  Result Value Ref Range Status   Specimen Description BLOOD RIGHT ANTECUBITAL  Final   Special Requests   Final    BOTTLES DRAWN AEROBIC AND ANAEROBIC Blood Culture adequate volume   Culture   Final    NO GROWTH 3 DAYS Performed at [facility], [address], [place], [place] [address]    Report Status PENDING  Incomplete  Urine culture     Status: Abnormal   Collection Time: 08/14/19 10:13 PM   Specimen: Urine, Random  Result Value Ref Range Status   Specimen Description   Final    URINE, RANDOM Performed at [facility], [address],  [place], [place] [address]    Special Requests   Final    NONE Performed at [facility], [address], [place], [place] [address]    Culture (A)  Final    <10,000 COLONIES/mL INSIGNIFICANT GROWTH Performed at [facility], [place] [address] [place], [place] [address]    Report Status 08/15/2019 FINAL  Final  MRSA PCR Screening     Status: Abnormal   Collection Time: 08/17/19  3:09 PM   Specimen: Nasal Mucosa; Nasopharyngeal  Result Value Ref Range Status   MRSA by PCR POSITIVE (A) NEGATIVE Final    Comment:        The GeneXpert MRSA Assay (FDA approved for NASAL specimens only), is one component of a comprehensive MRSA colonization surveillance program. It is not intended to diagnose MRSA infection nor to guide or monitor treatment for MRSA infections. RESULT CALLED TO, READ BACK BY AND VERIFIED WITH: RILEY CORELLE 08/17/19 1628 KLW Performed at [facility], [address] [place], [place] [address]          Radiology Studies: No results found.      Scheduled Meds: . abacavir-dolutegravir-lamiVUDine  1 tablet Oral QHS  . aspirin  325 mg Oral Daily  . Chlorhexidine Gluconate Cloth  6 each Topical Q0600  . diphenhydrAMINE  50 mg Oral QHS  . divalproex  500 mg Oral Daily  . donepezil  5 mg Oral QHS  . enoxaparin (LOVENOX) injection  40 mg Subcutaneous Q24H  . ferrous sulfate  325 mg Oral Q breakfast  . gabapentin  300 mg Oral TID  . gemfibrozil  600 mg Oral BID AC  . haloperidol  5 mg Oral BID  . insulin aspart  0-15 Units Subcutaneous TID WC  . insulin aspart  0-5 Units Subcutaneous QHS  . losartan  25 mg Oral Daily  . memantine  10 mg Oral BID  . metoprolol tartrate  12.5 mg Oral BID  . metroNIDAZOLE  500 mg Oral Q8H  . mupirocin ointment  1 application Nasal BID  . venlafaxine XR  75 mg Oral Daily   Continuous Infusions: . ceFEPime (MAXIPIME) IV 2 g (08/18/19 0014)  . dextrose 50 mL/hr at 08/17/19 0919  . vancomycin 1,750 mg  (08/17/19 1422)    Assessment & Plan:   Principal Problem:   Sepsis ([place]) Active Problems:   Chronic diastolic heart failure ([place])  HTN (hypertension)   Diabetes ([place])   Orthostatic dizziness   HIV positive (H[facility])   FUO (fever of unknown origin)  Suspectsepsis: possibly secondary to pneumonia.clinically slowly improving.  Hemodynamically stable.  COVID19 PCR neg. Influenza neg. Urine cx shows insignificant growth, blood cx NGTD.  Continue on IV cefepime, vancomycin and metronidazole for now for total of 5 days. MRSA pcr +  AKI- likely prerenal. ivf was d/c'd, will restart gentle hydration Monitor labs   Possible pneumonia: b/l as per CT.  Continue on IV abxs as above Pro-cal 0.10.  Continue on bronchodilators.  Encourage incentive spirometry  Leukocytosis: 2/2 sepsis. Now resolved   Hypokalemia: KCl repleated, now resolved Continue to monitor  Chronic diastolic heart failure:Euvolemic  Continue losartan and metoprolol. Hold for MAP <65  Chronic back pain/DDD: scheduled for back surgery in March 2021  HTN: continue losartan and metoprolol. Hold for MAP <65  DM2: continue to hold home po DM meds. Continue on SSI w/ accuchecks  Normocytic anemia: no need for a transfusion at this time. Will continue to monitor H&H  HIV: continue on home dose of abacavir/dolutegravir/lamivudine   Schizophrenia: continue on home dose of haldol, depakote    DVT prophylaxis: lovenox Code Status:full  Family Communication: none at bedside Disposition Plan: Still not at baseline from clinical stand point. Continue with IV abx. Hopefully if clinically improves d/c in 1-2 days. Needs home PT/OT       LOS: 3 days   Time spent: 45 min with >50 % on coc    Nolberto Hanlon, MD [facility] Pager [phone]  If 7PM-7AM, please contact night-coverage [email or web address] Password [facility] 08/18/2019, 7:29 AM Patient ID: Danny Cook., male   DOB: [birth year], 59  y.o.   MRN: [id number]

## 2019-08-18 NOTE — TOC Progression Note (Signed)
Transition of Care [facility]) - Progression Note    Patient Details  Name: Danny Cook. MRN: [id number] Date of Birth: July 18, 1960  Transition of [facility]) CM/SW Contact  Kerryn Tennant, Gard[place] Rhyme, LCSW Phone Number: 08/18/2019, 9:28 AM  Clinical Narrative:   Spoke with MD who feels in the next two days he will be ready to return back to his [facility]. Spoke with Katharine Look at [facility] and he will need a new COVID test before returning. MD is aware and will order this. Pt needing tow more days of IV Vanco before being medically ready to return to facility.    Expected Discharge Plan: Group Home Barriers to Discharge: Continued Medical Work up  Expected Discharge Plan and Services Expected Discharge Plan: Group Home In-house Referral: Clinical Social Work     Living arrangements for the past 2 months: Group Home                                       Social Determinants of Health (SDOH) Interventions    Readmission Risk Interventions Readmission Risk Prevention Plan 03/27/2018  Transportation Screening Complete  Medication Review Press photographer) Complete  PCP or Specialist appointment within 3-5 days of discharge Complete  HRI or Home Care Consult Complete  SW Recovery Care/Counseling Consult Complete  Palliative Care Screening Not Complete  Medication Reconcilation (Pharmacy) Complete  [place] Not Complete  Some recent data might be hidden

## 2019-08-18 NOTE — Plan of Care (Signed)
  Problem: Clinical Measurements: Goal: Will remain free from infection Outcome: Progressing   Problem: Activity: Goal: Risk for activity intolerance will decrease Outcome: Progressing   Problem: Nutrition: Goal: Adequate nutrition will be maintained Outcome: Progressing   Problem: Coping: Goal: Level of anxiety will decrease Outcome: Not Progressing Note: Patient consistently reporting anxiety requiring PRN medications. Patient educated on deep breathing and non pharmacological stress reducing activities.

## 2019-08-18 NOTE — Progress Notes (Signed)
Physical Therapy Treatment Patient Details Name: Danny Cook. MRN: [id number] DOB: Feb 23, 1961 Today's Date: 08/18/2019    History of Present Illness Pt is 59 y/o M with PMH: dCHF, HTN, DM, HIV, paranoia, schizophrenia, and sleep apnea. Pt presented with fever which is now trending down. COVID19 neg. Influenza neg. Urine cx shows insignificant growth, blood cx NGTD. Suspect sepsis 2/2 PNA.    PT Comments    Pt is making good progress towards goals with ability to demonstrate improved gait speed this session. Safe technique with RW used. Pt tired and reports he had just returned to bed from sitting in chair for lunch. With encouragement, he was agreeable to ambulate in hallway. Will continue to progress as pt nearing baseline level.   Follow Up Recommendations  Home health PT     Equipment Recommendations  Rolling walker with 5" wheels    Recommendations for Other Services       Precautions / Restrictions Precautions Precautions: Fall Restrictions Weight Bearing Restrictions: No    Mobility  Bed Mobility Overal bed mobility: Needs Assistance Bed Mobility: Supine to Sit     Supine to sit: Min guard     General bed mobility comments: assist for trunk support. Once seated at EOB, able to sit with upright posture  Transfers Overall transfer level: Needs assistance Equipment used: Rolling walker (2 wheeled) Transfers: Sit to/from Stand Sit to Stand: Min guard         General transfer comment: safe technique performed.  Ambulation/Gait Ambulation/Gait assistance: Min guard Gait Distance (Feet): 200 Feet Assistive device: Rolling walker (2 wheeled) Gait Pattern/deviations: Step-through pattern     General Gait Details: reciprocal gait pattern performed with upright posture. Occasional stopping due to knee pain. Safe technique using RW. INcreased speed this date.    Stairs             Wheelchair Mobility    Modified Rankin (Stroke Patients  Only)       Balance Overall balance assessment: Needs assistance Sitting-balance support: Feet supported;No upper extremity supported Sitting balance-Leahy Scale: Normal     Standing balance support: Bilateral upper extremity supported Standing balance-Leahy Scale: Good                              Cognition Arousal/Alertness: Awake/alert Behavior During Therapy: WFL for tasks assessed/performed Overall Cognitive Status: Within Functional Limits for tasks assessed                                        Exercises Other Exercises Other Exercises: pt wished to return to bed due to fatigue, ther-ex deferred this date    General Comments        Pertinent Vitals/Pain Pain Assessment: Faces Faces Pain Scale: Hurts little more Pain Location: R knee and lower back Pain Descriptors / Indicators: Aching;Dull Pain Intervention(s): Limited activity within patient's tolerance    Home Living                      Prior Function            PT Goals (current goals can now be found in the care plan section) Acute Rehab PT Goals Patient Stated Goal: to rest PT Goal Formulation: With patient Time For Goal Achievement: 08/31/19 Potential to Achieve Goals: Good Progress towards PT goals: Progressing toward  goals    Frequency    Min 2X/week      PT Plan Current plan remains appropriate    Co-evaluation              AM-PAC PT "6 Clicks" Mobility   Outcome Measure  Help needed turning from your back to your side while in a flat bed without using bedrails?: None Help needed moving from lying on your back to sitting on the side of a flat bed without using bedrails?: None Help needed moving to and from a bed to a chair (including a wheelchair)?: A Little Help needed standing up from a chair using your arms (e.g., wheelchair or bedside chair)?: A Little Help needed to walk in hospital room?: A Little Help needed climbing 3-5 steps  with a railing? : A Little 6 Click Score: 20    End of Session Equipment Utilized During Treatment: Gait belt Activity Tolerance: Patient tolerated treatment well Patient left: in bed;with bed alarm set Nurse Communication: Mobility status PT Visit Diagnosis: Unsteadiness on feet (R26.81);Muscle weakness (generalized) (M62.81);Difficulty in walking, not elsewhere classified (R26.2)     Time: [id number] PT Time Calculation (min) (ACUTE ONLY): 12 min  Charges:  $Gait Training: 8-22 mins                     Danny Cook, [place], DPT [phone]    Danny Cook 08/18/2019, 1:47 PM

## 2019-08-18 NOTE — Care Management Important Message (Signed)
Important Message  Patient Details  Name: Danny Cook. MRN: [id number] Date of Birth: 20-Apr-1961   Medicare Important Message Given:  Yes     [name] 08/18/2019, 1:43 PM

## 2019-08-18 NOTE — Progress Notes (Signed)
OT Cancellation Note  Patient Details Name: Danny Cook. MRN: [id number] DOB: 17-Aug-1960   Cancelled Treatment:    Reason Eval/Treat Not Completed: Other (comment)   Approached patient in afternoon this date.  Patient asleep in bed.  Once awoken, patient stated he was feeling "exhausted" today and declined participation in therapy.  Danny Cook 08/18/2019, 5:00 PM

## 2019-08-18 NOTE — Progress Notes (Signed)
Hypoglycemic Event  CBG: 69  Treatment: 4 oz orange juice  Symptoms: asymptomatic  Follow-up CBG: Time:1826  CBG Result:112  Possible Reasons for Event: IVF changed from D10 to 1/2NS per MD order  Comments/MD notified:Amery   Danny Cook Silvestre Moment

## 2019-08-19 LAB — CULTURE, BLOOD (ROUTINE X 2)
Culture: NO GROWTH
Culture: NO GROWTH
Special Requests: ADEQUATE
Special Requests: ADEQUATE

## 2019-08-19 LAB — BASIC METABOLIC PANEL
Anion gap: 8 (ref 5–15)
BUN: 12 mg/dL (ref 6–20)
CO2: 25 mmol/L (ref 22–32)
Calcium: 8.7 mg/dL — ABNORMAL LOW (ref 8.9–10.3)
Chloride: 105 mmol/L (ref 98–111)
Creatinine, Ser: 1.02 mg/dL (ref 0.61–1.24)
GFR calc Af Amer: >60 mL/min (ref >60)
GFR calc non Af Amer: >60 mL/min (ref >60)
Glucose, Bld: 107 mg/dL — ABNORMAL HIGH (ref 70–99)
Potassium: 3.7 mmol/L (ref 3.5–5.1)
Sodium: 138 mmol/L (ref 135–145)

## 2019-08-19 LAB — GLUCOSE, CAPILLARY
Glucose-Capillary: 109 mg/dL — ABNORMAL HIGH (ref 70–99)
Glucose-Capillary: 113 mg/dL — ABNORMAL HIGH (ref 70–99)

## 2019-08-19 MED ORDER — METRONIDAZOLE 500 MG PO TABS
500.0000 mg | ORAL_TABLET | Freq: Three times a day (TID) | ORAL | Status: DC
  Administered 2019-08-19: 10:00:00 500 mg via ORAL

## 2019-08-19 MED ORDER — TRESIBA FLEXTOUCH 200 UNIT/ML ~~LOC~~ SOPN: 10.0000 [IU] | PEN_INJECTOR | Freq: Every day | SUBCUTANEOUS | Status: DC

## 2019-08-19 MED ORDER — SODIUM CHLORIDE 0.9 % IV SOLN
2.0000 g | Freq: Three times a day (TID) | INTRAVENOUS | Status: AC
  Administered 2019-08-19: 13:00:00 2 g via INTRAVENOUS
  Filled 2019-08-19: qty 2

## 2019-08-19 MED ORDER — VANCOMYCIN HCL 1750 MG/350ML IV SOLN
1750.0000 mg | INTRAVENOUS | Status: AC
  Administered 2019-08-19: 11:00:00 1750 mg via INTRAVENOUS
  Filled 2019-08-19: qty 350

## 2019-08-19 NOTE — TOC Transition Note (Signed)
Transition of Care [facility]) - CM/SW Discharge Note   Patient Details  Name: Danny Cook. MRN: [id number] Date of Birth: [birth year]  [facility] CM/SW Contact:  Elease Hashimoto, LCSW Phone Number: 08/19/2019, 12:33 PM   Clinical Narrative:   Spoke with pt and Lawanda-Owner of [facility] to inform pt is ready to transfer back today. She wants pt to bring packet with FL2 and AVS with him. Offered to fax information and she reported not necessary. They have a contract with [name] [employer] and RN to call them and they will take pt back to facility when ready. Awaiting MD to complete paperwork to send pt back to facility.  1:21 pm MD has completed paperwork and RN aware ready to return to [facility] once IV Vanc finished and he is ready to go.  Final next level of care: Group Home Barriers to Discharge: Barriers Resolved   Patient Goals and CMS Choice        Discharge Placement                Patient to be transferred to facility by: Orvan July [facility] has contract with Name of family member notified: [facility] owner Patient and family notified of of transfer: 08/19/19  Discharge Plan and Services In-house Referral: Clinical Social Work                        HH Arranged: PT, OT HH Agency: [place] ([facility]) Date HH Agency Contacted: 08/17/19 Time HH Agency Contacted: 1200 Representative spoke with at [place]: [place] ([place]) Interventions     Readmission Risk Interventions Readmission Risk Prevention Plan 03/27/2018  Transportation Screening Complete  Medication Review Press photographer) Complete  PCP or Specialist appointment within 3-5 days of discharge Complete  HRI or Home Care Consult Complete  SW Recovery Care/Counseling Consult Complete  Palliative Care Screening Not Complete  Medication Reconcilation (Pharmacy) Complete  [place] Not Complete  Some recent data might be  hidden

## 2019-08-19 NOTE — Progress Notes (Signed)
Occupational Therapy Treatment Patient Details Name: Danny Cook. MRN: [id number] DOB: 1960/07/16 Today's Date: 08/19/2019    History of present illness Pt is 59 y/o M with PMH: dCHF, HTN, DM, HIV, paranoia, schizophrenia, and sleep apnea. Pt presented with fever which is now trending down. COVID19 neg. Influenza neg. Urine cx shows insignificant growth, blood cx NGTD. Suspect sepsis 2/2 PNA.   OT comments  Patient seen this AM for treatment.  Patient in good spirits stating he is expecting to go home soon.  Patient agreeable to therapy.  Able to don/doff gown with SBA while seated when patient was not attached to IV pole. Completed item retrieval in regards to grooming using 2WW and SBA within room.  Minimal verbal cueing needed for obstacle management during functional mobility. Educated patient in upright body mechanics/posture to improve safety during functional mobility during ADL tasks.  Patient demonstrated carryover.  Assisted patient in heightening his 2WW per request.  Educated patient on energy conservation techniques when feeling fatigued or dizzy completing ADLs or functional mobility.  Patient verbalized understanding.  Patient participated well in session.  Based on today's performance, discharge recommendation remains appropriate.        Follow Up Recommendations  Home health OT;Supervision - Intermittent    Equipment Recommendations  3 in 1 bedside commode;Other (comment)    Recommendations for Other Services      Precautions / Restrictions Precautions Precautions: Fall Restrictions Weight Bearing Restrictions: No       Mobility Bed Mobility                  Transfers Overall transfer level: Needs assistance Equipment used: Rolling walker (2 wheeled)(Patient voiced concerns about FWW being too low.  Assisted in heightening it for patient.) Transfers: Sit to/from [facility] Sit to Stand: Min guard(SBA) Stand pivot transfers:  Min guard(SBA)       General transfer comment: Patient performs sequencing well and maintained good safety awareness.    Balance           Standing balance support: No upper extremity supported Standing balance-Leahy Scale: Fair Standing balance comment: Patient able to reach out of base of support to reach items with dominant hand                           ADL either performed or assessed with clinical judgement   ADL                                         General ADL Comments: SBA for UB dressing and grooming tasks while sitting and standing.     Vision       Perception     Praxis      Cognition Arousal/Alertness: Awake/alert Behavior During Therapy: WFL for tasks assessed/performed Overall Cognitive Status: Within Functional Limits for tasks assessed                                          Exercises     Shoulder Instructions       General Comments      Pertinent Vitals/ Pain       Pain Assessment: No/denies pain  Home Living  Prior Functioning/Environment              Frequency  Min 2X/week        Progress Toward Goals  OT Goals(current goals can now be found in the care plan section)  Progress towards OT goals: Progressing toward goals     Plan Discharge plan remains appropriate    Co-evaluation                 AM-PAC OT "6 Clicks" Daily Activity     Outcome Measure   Help from another person eating meals?: None Help from another person taking care of personal grooming?: None Help from another person toileting, which includes using toliet, bedpan, or urinal?: A Little Help from another person bathing (including washing, rinsing, drying)?: A Little Help from another person to put on and taking off regular upper body clothing?: None Help from another person to put on and taking off regular lower body clothing?: A  Little 6 Click Score: 21    End of Session Equipment Utilized During Treatment: Gait belt;Rolling walker  OT Visit Diagnosis: Unsteadiness on feet (R26.81);Muscle weakness (generalized) (M62.81)   Activity Tolerance Patient tolerated treatment well   Patient Left in chair;with call bell/phone within reach;with chair alarm set;Other (comment)   Nurse Communication          [id number] OT Time Calculation (min): 33 min  Charges: OT General Charges $OT Visit: 1 Visit OT Treatments $Self Care/Home Management : 8-22 mins $Therapeutic Activity: 23-37 mins  Baldomero Lamy, MS, OTR/L 08/19/19, 12:52 PM

## 2019-08-19 NOTE — Progress Notes (Signed)
Called [facility] to give updates, coordinator asked that MD send any Rx to [facility] in [place].   Sent sure not to MD.

## 2019-08-19 NOTE — Discharge Summary (Signed)
Danny Cook. [id number] DOB: 1961/04/27 DOA: 08/14/2019  PCP: Tracie Harrier, MD  Admit date: 08/14/2019 Discharge date: 08/19/2019  Ad[facility]: [facility] Disposition:  same  Recommendations for Outpatient Follow-up:  1. Follow up with PCP in 3 days.  Insulin 2. Please obtain BMP/CBC in days 3. Please follow up on the following pending results:none      Discharge Condition:Stable CODE STATUS: Full Diet recommendation: Heart Healthy / Carb Modified  Brief/Interim Summary: [id number] Danny Cookis a 59 y.o.malewith medical history significant forHIV, schizophrenia, diabetes, chronic diastolic heart failure, presented to the emergency room with a 2-day history of muscle aches, generalized malaise and 1 day history of fever.Patient received a second Covid shot a week ago. EMS reported a temperature of 162for which he got Tylenol. He had a mild cough. Denied shortness of breath. ER he became hypotensive, lactic acid was elevated 2.4.  COVID-19 test was negative.  He was suspected to have sepsis was started on IV cefepime, vancomycin, metronidazole and IV fluids. He had a CT of abdomen that revealed bibasilar airspace opacity concerning for consolidating pneumonia.  His blood cultures were negative to date.  Urine culture was insignificant growth.  He completed his 5-day course for pneumonia today.  His leukocytosis improved.  He was also hypokalemic which was replaced and resolved.  He has history of chronic diastolic heart failure which she remained euvolemic.  He was found acute kidney injury on admission which improved.  We held his Bumex.  His sugars have been controlled in the hospital.  Upon discharge his home insulin dose was dramatically decreased as he did not require as much here.  We will definitely need to follow-up with his primary care for further adjustment.  We will need home PT OT.  He is stable to be discharged home today.  He is on  room air. Discharge Diagnoses:  Principal Problem:   Sepsis ([place]) Active Problems:   Chronic diastolic heart failure (HCC)   HTN (hypertension)   Diabetes (HCC)   Orthostatic dizziness   HIV positive (HCC)   FUO (fever of unknown origin)    Discharge Instructions  Discharge Instructions    Call MD for:  temperature >100.4   Complete by: As directed    Diet - low sodium heart healthy   Complete by: As directed    Discharge instructions   Complete by: As directed    Follow-up with PCP in 2 days Follow-up with endocrinologist if he has been by Monday   Increase activity slowly   Complete by: As directed      Allergies as of 08/19/2019      Reactions   Trazodone And Nefazodone    Affects other meds that they dont work   Trazodone Hcl Other (See Comments)   Viread [tenofovir Disoproxil]    Damages kidneys   Etodolac Rash      Medication List    STOP taking these medications   bumetanide 1 MG tablet Commonly known as: BUMEX   diphenhydrAMINE 50 MG capsule Commonly known as: BENADRYL   promethazine 6.25 MG/5ML syrup Commonly known as: PHENERGAN     TAKE these medications   acetaminophen 325 MG tablet Commonly known as: TYLENOL Take 2 tablets (650 mg total) by mouth every 6 (six) hours as needed for fever (Fever >101).   albuterol 108 (90 Base) MCG/ACT inhaler Commonly known as: VENTOLIN HFA Inhale 2 puffs into the lungs every 6 (six) hours as needed for wheezing  or shortness of breath.   aspirin EC 325 MG tablet Take 325 mg by mouth daily.   butalbital-aspirin-caffeine 50-325-40 MG capsule Commonly known as: FIORINAL Take 1-2 capsules by mouth every 6 (six) hours as needed for headache.   cyanocobalamin 1000 MCG tablet Take 1,000 mcg by mouth daily.   diazepam 5 MG tablet Commonly known as: VALIUM Take 5 mg by mouth 2 (two) times daily as needed for anxiety.   divalproex 500 MG 24 hr tablet Commonly known as: DEPAKOTE ER Take 500 mg by mouth at  bedtime.   donepezil 5 MG tablet Commonly known as: ARICEPT Take 5 mg by mouth daily.   Fanapt 12 MG Tabs Generic drug: Iloperidone Take 12 mg by mouth 2 (two) times daily.   feeding supplement (GLUCERNA SHAKE) Liqd Take 237 mLs by mouth daily.   ferrous sulfate 325 (65 FE) MG tablet Take 325 mg by mouth daily with breakfast.   Fish Oil 1000 MG Caps Take 1,000 mg by mouth daily.   fluticasone 220 MCG/ACT inhaler Commonly known as: Flovent HFA Inhale 2 puffs into the lungs 2 (two) times daily. Rinse out mouth afterwards   fluticasone 50 MCG/ACT nasal spray Commonly known as: FLONASE Place 2 sprays into both nostrils 2 (two) times daily as needed for allergies.   gabapentin 300 MG capsule Commonly known as: NEURONTIN Take 300 mg by mouth 3 (three) times daily.   gemfibrozil 600 MG tablet Commonly known as: LOPID Take 600 mg by mouth 2 (two) times daily before a meal.   glimepiride 4 MG tablet Commonly known as: AMARYL Take 4 mg by mouth daily with breakfast.   haloperidol 5 MG tablet Commonly known as: HALDOL Take 5 mg by mouth 2 (two) times daily.   losartan 25 MG tablet Commonly known as: COZAAR Take 1 tablet (25 mg total) by mouth daily.   memantine 10 MG tablet Commonly known as: NAMENDA Take 10 mg by mouth 2 (two) times daily.   metFORMIN 1000 MG tablet Commonly known as: GLUCOPHAGE Take 1,000 mg by mouth 2 (two) times daily with a meal.   metoprolol tartrate 25 MG tablet Commonly known as: LOPRESSOR Take 0.5 tablets (12.5 mg total) by mouth 2 (two) times daily.   mirtazapine 30 MG tablet Commonly known as: REMERON Take 30 mg by mouth at bedtime.   nitroGLYCERIN 0.4 MG SL tablet Commonly known as: NITROSTAT Place 0.4 mg under the tongue every 5 (five) minutes as needed for chest pain.   NovoLOG FlexPen 100 UNIT/ML FlexPen Generic drug: insulin aspart Inject 6 Units into the skin 3 (three) times daily as needed for high blood sugar (blood sugar  over 100). Add sliding scale if sugar over 100; 200-250=2 units, 251-300=4 units, 301-350=6 units, 351-400= 8 units, over 400=10 units.   omeprazole 40 MG capsule Commonly known as: PRILOSEC Take 40 mg by mouth 2 (two) times daily.   Ozempic (0.25 or 0.5 MG/DOSE) 2 MG/1.5ML Sopn Generic drug: Semaglutide(0.25 or 0.5MG /DOS) Inject 0.5 mg into the skin once a week.   rosuvastatin 40 MG tablet Commonly known as: CRESTOR Take 40 mg by mouth daily.   sucralfate 1 g tablet Commonly known as: CARAFATE Take 1 g by mouth 4 (four) times daily.   tamsulosin 0.4 MG Caps capsule Commonly known as: FLOMAX Take 0.4 mg by mouth daily.   Tyler Aas FlexTouch 200 UNIT/ML Sopn Generic drug: Insulin Degludec Inject 10 Units into the skin daily. What changed: how much to take   triamcinolone  cream 0.1 % Commonly known as: KENALOG Apply 1 application topically 2 (two) times daily as needed. What changed: when to take this   Triumeq 600-50-300 MG tablet Generic drug: abacavir-dolutegravir-lamiVUDine Take 1 tablet by mouth at bedtime.   venlafaxine XR 75 MG 24 hr capsule Commonly known as: EFFEXOR-XR Take 1 capsule (75 mg total) by mouth daily with breakfast.            Durable Medical Equipment  (From admission, onward)         Start     Ordered   08/17/19 1817  For home use only DME Walker rolling  Once    Question Answer Comment  Walker: With Harleigh Wheels   Patient needs a walker to treat with the following condition Gait abnormality      08/17/19 1816         Follow-up Information    Hande, Cherlyn Labella, MD Follow up in 3 day(s).   Specialty: Internal Medicine Why: needs insulin management  Contact information: [place] [address] [phone]          Allergies  Allergen Reactions  . Trazodone And Nefazodone     Affects other meds that they dont work  . Trazodone Hcl Other (See Comments)  . Viread [Tenofovir  Disoproxil]     Damages kidneys  . Etodolac Rash    Consultations:   None  Procedures/Studies: CT ABDOMEN PELVIS WO CONTRAST  Result Date: 08/15/2019 CLINICAL DATA:  Low back pain. EXAM: CT ABDOMEN AND PELVIS WITHOUT CONTRAST TECHNIQUE: Multidetector CT imaging of the abdomen and pelvis was performed following the standard protocol without IV contrast. COMPARISON:  04/29/2018. FINDINGS: Lower chest: There are bibasilar airspace opacities concerning for consolidation.The heart size is normal. Hepatobiliary: The liver is normal. Normal gallbladder.There is no biliary ductal dilation. Pancreas: Normal contours without ductal dilatation. No peripancreatic fluid collection. Spleen: No splenic laceration or hematoma. Adrenals/Urinary Tract: --Adrenal glands: There is a stable left adrenal mass measuring approximately 3.8 cm --Right kidney/ureter: There is a nonobstructing stone in the interpolar region of the right kidney measuring approximately 1.1 cm (axial series 2, image 44). --Left kidney/ureter: No hydronephrosis or perinephric hematoma. --Urinary bladder: Unremarkable. Stomach/Bowel: --Stomach/Duodenum: No hiatal hernia or other gastric abnormality. Normal duodenal course and caliber. --Small bowel: No dilatation or inflammation. --Colon: No focal abnormality. --Appendix: Normal. Vascular/Lymphatic: Atherosclerotic calcification is present within the non-aneurysmal abdominal aorta, without hemodynamically significant stenosis. --No retroperitoneal lymphadenopathy. --No mesenteric lymphadenopathy. --No pelvic or inguinal lymphadenopathy. Reproductive: Unremarkable Other: No ascites or free air. The abdominal wall is normal. Musculoskeletal. No acute displaced fractures. IMPRESSION: 1. Bibasilar airspace opacities concerning no valve ping pneumonia. 2. Nonobstructing right nephrolithiasis. 3. Stable left adrenal mass. 4. No acute intra-abdominal or pelvic abnormalities. 5. Aortic Atherosclerosis  (ICD10-I70.0). Electronically Signed   By: Constance Holster M.D.   On: 08/15/2019 02:28   DG Chest 1 View  Result Date: 08/14/2019 CLINICAL DATA:  Fever EXAM: CHEST  1 VIEW COMPARISON:  05/03/2018 FINDINGS: Single frontal view of the chest demonstrates an unremarkable cardiac silhouette. No airspace disease, effusion, or pneumothorax. IMPRESSION: 1. No acute intrathoracic process. Electronically Signed   By: Randa Ngo M.D.   On: 08/14/2019 22:48       Subjective: Feels better.  Denies shortness of breath, cough, or any other symptoms.  Discharge Exam: Vitals:   08/18/19 2317 08/19/19 0801  BP: 139/68 124/72  Pulse: 63 60  Resp: 18 18  Temp: 98.1  F (36.7 C) 98.2 F (36.8 C)  SpO2: 96% 95%   Vitals:   08/18/19 2110 08/18/19 2110 08/18/19 2317 08/19/19 0801  BP: (!) 142/73 (!) 142/73 139/68 124/72  Pulse: 63 62 63 60  Resp:  18 18 18   Temp:   98.1 F (36.7 C) 98.2 F (36.8 C)  TempSrc:      SpO2:  96% 96% 95%  Weight:      Height:        General: Pt is alert, awake, not in acute distress Cardiovascular: RRR, S1/S2 +, no rubs, no gallops Respiratory: CTA bilaterally, no wheezing, no rhonchi Abdominal: Soft, NT, ND, bowel sounds + Extremities: no edema, no cyanosis    The results of significant diagnostics from this hospitalization (including imaging, microbiology, ancillary and laboratory) are listed below for reference.     Microbiology: Recent Results (from the past 240 hour(s))  Respiratory Panel by RT PCR (Flu A&B, Covid) - Nasopharyngeal Swab     Status: None   Collection Time: 08/14/19 12:05 AM   Specimen: Nasopharyngeal Swab  Result Value Ref Range Status   SARS Coronavirus 2 by RT PCR NEGATIVE NEGATIVE Final    Comment: (NOTE) SARS-CoV-2 target nucleic acids are NOT DETECTED. The SARS-CoV-2 RNA is generally detectable in upper respiratoy specimens during the acute phase of infection. The lowest concentration of SARS-CoV-2 viral copies this assay  can detect is 131 copies/mL. A negative result does not preclude SARS-Cov-2 infection and should not be used as the sole basis for treatment or other patient management decisions. A negative result may occur with  improper specimen collection/handling, submission of specimen other than nasopharyngeal swab, presence of viral mutation(s) within the areas targeted by this assay, and inadequate number of viral copies (<131 copies/mL). A negative result must be combined with clinical observations, patient history, and epidemiological information. The expected result is Negative. Fact Sheet for Patients:  PinkCheek.be Fact Sheet for Healthcare Providers:  GravelBags.it This test is not yet ap proved or cleared by the [place] FDA and  has been authorized for detection and/or diagnosis of SARS-CoV-2 by FDA under an Emergency Use Authorization (EUA). This EUA will remain  in effect (meaning this test can be used) for the duration of the COVID-19 declaration under Section 564(b)(1) of the Act, 21 U.S.C. section 360bbb-3(b)(1), unless the authorization is terminated or revoked sooner.    Influenza A by PCR NEGATIVE NEGATIVE Final   Influenza B by PCR NEGATIVE NEGATIVE Final    Comment: (NOTE) The Xpert Xpress SARS-CoV-2/FLU/RSV assay is intended as an aid in  the diagnosis of influenza from Nasopharyngeal swab specimens and  should not be used as a sole basis for treatment. Nasal washings and  aspirates are unacceptable for Xpert Xpress SARS-CoV-2/FLU/RSV  testing. Fact Sheet for Patients: PinkCheek.be Fact Sheet for Healthcare Providers: GravelBags.it This test is not yet approved or cleared by the [place] FDA and  has been authorized for detection and/or diagnosis of SARS-CoV-2 by  FDA under an Emergency Use Authorization (EUA). This EUA will remain  in effect  (meaning this test can be used) for the duration of the  Covid-19 declaration under Section 564(b)(1) of the Act, 21  U.S.C. section 360bbb-3(b)(1), unless the authorization is  terminated or revoked. Performed at [facility], [address] [place], [place] [address]   Culture, blood (Routine x 2)     Status: None   Collection Time: 08/14/19 10:13 PM   Specimen: BLOOD  Result Value Ref  Range Status   Specimen Description BLOOD RIGHT ARM  Final   Special Requests   Final    BOTTLES DRAWN AEROBIC AND ANAEROBIC Blood Culture adequate volume   Culture   Final    NO GROWTH 5 DAYS Performed at [facility], [address] [place], [place] [address]    Report Status 08/19/2019 FINAL  Final  Culture, blood (Routine x 2)     Status: None   Collection Time: 08/14/19 10:13 PM   Specimen: BLOOD  Result Value Ref Range Status   Specimen Description BLOOD RIGHT ANTECUBITAL  Final   Special Requests   Final    BOTTLES DRAWN AEROBIC AND ANAEROBIC Blood Culture adequate volume   Culture   Final    NO GROWTH 5 DAYS Performed at [facility], [address] [place], [place] [address]    Report Status 08/19/2019 FINAL  Final  Urine culture     Status: Abnormal   Collection Time: 08/14/19 10:13 PM   Specimen: Urine, Random  Result Value Ref Range Status   Specimen Description   Final    URINE, RANDOM Performed at [facility], [address] [place], [place] [address]    Special Requests   Final    NONE Performed at [facility], [address], [place], [place] [address]    Culture (A)  Final    <10,000 COLONIES/mL INSIGNIFICANT GROWTH Performed at [facility], [place] [address] [place], [place] [address]    Report Status 08/15/2019 FINAL  Final  MRSA PCR Screening     Status: Abnormal   Collection Time: 08/17/19  3:09 PM   Specimen: Nasal Mucosa; Nasopharyngeal  Result Value Ref Range Status   MRSA by PCR POSITIVE (A)  NEGATIVE Final    Comment:        The GeneXpert MRSA Assay (FDA approved for NASAL specimens only), is one component of a comprehensive MRSA colonization surveillance program. It is not intended to diagnose MRSA infection nor to guide or monitor treatment for MRSA infections. RESULT CALLED TO, READ BACK BY AND VERIFIED WITH: RILEY CORELLE 08/17/19 1628 KLW Performed at [facility], [place], [place] [address]   SARS CORONAVIRUS 2 (TAT 6-24 HRS) Nasopharyngeal Nasopharyngeal Swab     Status: None   Collection Time: 08/18/19  4:16 PM   Specimen: Nasopharyngeal Swab  Result Value Ref Range Status   SARS Coronavirus 2 NEGATIVE NEGATIVE Final    Comment: (NOTE) SARS-CoV-2 target nucleic acids are NOT DETECTED. The SARS-CoV-2 RNA is generally detectable in upper and lower respiratory specimens during the acute phase of infection. Negative results do not preclude SARS-CoV-2 infection, do not rule out co-infections with other pathogens, and should not be used as the sole basis for treatment or other patient management decisions. Negative results must be combined with clinical observations, patient history, and epidemiological information. The expected result is Negative. Fact Sheet for Patients: SugarRoll.be Fact Sheet for Healthcare Providers: https://www.woods-mathews.com/ This test is not yet approved or cleared by the [place] FDA and  has been authorized for detection and/or diagnosis of SARS-CoV-2 by FDA under an Emergency Use Authorization (EUA). This EUA will remain  in effect (meaning this test can be used) for the duration of the COVID-19 declaration under Section 56 4(b)(1) of the Act, 21 U.S.C. section 360bbb-3(b)(1), unless the authorization is terminated or revoked sooner. Performed at [facility], [place] [address] [place], [place] [address]  Labs: BNP (last 3 results) No results for  input(s): BNP in the last 8760 hours. Basic Metabolic Panel: Recent Labs  Lab 08/14/19 2213 08/14/19 2213 08/15/19 0505 08/16/19 0354 08/17/19 0431 08/18/19 0353 08/19/19 0446  NA 141  --  143 142 139  --  138  K 3.1*  --  3.4* 3.2* 3.8  --  3.7  CL 107  --  110 109 107  --  105  CO2 25  --  25 23 24   --  25  GLUCOSE 78  --  116* 106* 125*  --  107*  BUN 14  --  13 13 14   --  12  CREATININE 1.15   < > 1.11 1.32* 1.10 1.22 1.02  CALCIUM 8.6*  --  8.3* 8.3* 8.4*  --  8.7*   < > = values in this interval not displayed.   Liver Function Tests: Recent Labs  Lab 08/14/19 2213 08/15/19 0505  AST 15 18  ALT 11 10  ALKPHOS 77 76  BILITOT 0.4 0.6  PROT 6.7 6.7  ALBUMIN 3.4* 3.4*   No results for input(s): LIPASE, AMYLASE in the last 168 hours. No results for input(s): AMMONIA in the last 168 hours. CBC: Recent Labs  Lab 08/14/19 2213 08/15/19 0505 08/16/19 0354 08/17/19 0431  WBC 10.4 12.4* 9.8 8.9  NEUTROABS 7.9*  --   --   --   HGB 11.1* 11.8* 10.0* 10.1*  HCT 32.8* 35.6* 30.1* 30.3*  MCV 91.9 95.2 93.8 93.8  PLT 190 180 168 152   Cardiac Enzymes: No results for input(s): CKTOTAL, CKMB, CKMBINDEX, TROPONINI in the last 168 hours. BNP: Invalid input(s): POCBNP CBG: Recent Labs  Lab 08/18/19 1648 08/18/19 1826 08/18/19 2105 08/19/19 0755 08/19/19 1135  GLUCAP 69* 112* 82 109* 113*   D-Dimer No results for input(s): DDIMER in the last 72 hours. Hgb A1c No results for input(s): HGBA1C in the last 72 hours. Lipid Profile No results for input(s): CHOL, HDL, LDLCALC, TRIG, CHOLHDL, LDLDIRECT in the last 72 hours. Thyroid function studies No results for input(s): TSH, T4TOTAL, T3FREE, THYROIDAB in the last 72 hours.  Invalid input(s): FREET3 Anemia work up No results for input(s): VITAMINB12, FOLATE, FERRITIN, TIBC, IRON, RETICCTPCT in the last 72 hours. Urinalysis    Component Value Date/Time   COLORURINE YELLOW (A) 08/14/2019 2213   APPEARANCEUR CLEAR  (A) 08/14/2019 2213   APPEARANCEUR Clear 10/23/2014 1719   LABSPEC 1.020 08/14/2019 2213   LABSPEC 1.017 10/23/2014 1719   PHURINE 6.0 08/14/2019 2213   GLUCOSEU NEGATIVE 08/14/2019 2213   GLUCOSEU Negative 10/23/2014 1719   HGBUR NEGATIVE 08/14/2019 2213   BILIRUBINUR NEGATIVE 08/14/2019 2213   BILIRUBINUR Negative 10/23/2014 1719   KETONESUR NEGATIVE 08/14/2019 2213   PROTEINUR 100 (A) 08/14/2019 2213   NITRITE NEGATIVE 08/14/2019 2213   LEUKOCYTESUR NEGATIVE 08/14/2019 2213   LEUKOCYTESUR Negative 10/23/2014 1719   Sepsis Labs Invalid input(s): PROCALCITONIN,  WBC,  LACTICIDVEN Microbiology Recent Results (from the past 240 hour(s))  Respiratory Panel by RT PCR (Flu A&B, Covid) - Nasopharyngeal Swab     Status: None   Collection Time: 08/14/19 12:05 AM   Specimen: Nasopharyngeal Swab  Result Value Ref Range Status   SARS Coronavirus 2 by RT PCR NEGATIVE NEGATIVE Final    Comment: (NOTE) SARS-CoV-2 target nucleic acids are NOT DETECTED. The SARS-CoV-2 RNA is generally detectable in upper respiratoy specimens during the acute phase of infection. The lowest concentration of SARS-CoV-2 viral copies this assay can  detect is 131 copies/mL. A negative result does not preclude SARS-Cov-2 infection and should not be used as the sole basis for treatment or other patient management decisions. A negative result may occur with  improper specimen collection/handling, submission of specimen other than nasopharyngeal swab, presence of viral mutation(s) within the areas targeted by this assay, and inadequate number of viral copies (<131 copies/mL). A negative result must be combined with clinical observations, patient history, and epidemiological information. The expected result is Negative. Fact Sheet for Patients:  PinkCheek.be Fact Sheet for Healthcare Providers:  GravelBags.it This test is not yet ap proved or cleared by the  [place] FDA and  has been authorized for detection and/or diagnosis of SARS-CoV-2 by FDA under an Emergency Use Authorization (EUA). This EUA will remain  in effect (meaning this test can be used) for the duration of the COVID-19 declaration under Section 564(b)(1) of the Act, 21 U.S.C. section 360bbb-3(b)(1), unless the authorization is terminated or revoked sooner.    Influenza A by PCR NEGATIVE NEGATIVE Final   Influenza B by PCR NEGATIVE NEGATIVE Final    Comment: (NOTE) The Xpert Xpress SARS-CoV-2/FLU/RSV assay is intended as an aid in  the diagnosis of influenza from Nasopharyngeal swab specimens and  should not be used as a sole basis for treatment. Nasal washings and  aspirates are unacceptable for Xpert Xpress SARS-CoV-2/FLU/RSV  testing. Fact Sheet for Patients: PinkCheek.be Fact Sheet for Healthcare Providers: GravelBags.it This test is not yet approved or cleared by the [place] FDA and  has been authorized for detection and/or diagnosis of SARS-CoV-2 by  FDA under an Emergency Use Authorization (EUA). This EUA will remain  in effect (meaning this test can be used) for the duration of the  Covid-19 declaration under Section 564(b)(1) of the Act, 21  U.S.C. section 360bbb-3(b)(1), unless the authorization is  terminated or revoked. Performed at [facility] [place] [facility], [address] [place], [place] [address]   Culture, blood (Routine x 2)     Status: None   Collection Time: 08/14/19 10:13 PM   Specimen: BLOOD  Result Value Ref Range Status   Specimen Description BLOOD RIGHT ARM  Final   Special Requests   Final    BOTTLES DRAWN AEROBIC AND ANAEROBIC Blood Culture adequate volume   Culture   Final    NO GROWTH 5 DAYS Performed at [facility], [address], [place], [place] [address]    Report Status 08/19/2019 FINAL  Final  Culture, blood (Routine x 2)     Status: None   Collection  Time: 08/14/19 10:13 PM   Specimen: BLOOD  Result Value Ref Range Status   Specimen Description BLOOD RIGHT ANTECUBITAL  Final   Special Requests   Final    BOTTLES DRAWN AEROBIC AND ANAEROBIC Blood Culture adequate volume   Culture   Final    NO GROWTH 5 DAYS Performed at [facility], [address], [place], [place] [address]    Report Status 08/19/2019 FINAL  Final  Urine culture     Status: Abnormal   Collection Time: 08/14/19 10:13 PM   Specimen: Urine, Random  Result Value Ref Range Status   Specimen Description   Final    URINE, RANDOM Performed at [facility], [address], [place], [place] [address]    Special Requests   Final    NONE Performed at [facility], [address] [place],  [address]    Culture (A)  Final    <  10,000 COLONIES/mL INSIGNIFICANT GROWTH Performed at [facility], [place] [address] [place], [place] [address]    Report Status 08/15/2019 FINAL  Final  MRSA PCR Screening     Status: Abnormal   Collection Time: 08/17/19  3:09 PM   Specimen: Nasal Mucosa; Nasopharyngeal  Result Value Ref Range Status   MRSA by PCR POSITIVE (A) NEGATIVE Final    Comment:        The GeneXpert MRSA Assay (FDA approved for NASAL specimens only), is one component of a comprehensive MRSA colonization surveillance program. It is not intended to diagnose MRSA infection nor to guide or monitor treatment for MRSA infections. RESULT CALLED TO, READ BACK BY AND VERIFIED WITH: RILEY CORELLE 08/17/19 1628 KLW Performed at [facility], [place], [place] [address]   SARS CORONAVIRUS 2 (TAT 6-24 HRS) Nasopharyngeal Nasopharyngeal Swab     Status: None   Collection Time: 08/18/19  4:16 PM   Specimen: Nasopharyngeal Swab  Result Value Ref Range Status   SARS Coronavirus 2 NEGATIVE NEGATIVE Final    Comment: (NOTE) SARS-CoV-2 target nucleic acids are NOT DETECTED. The SARS-CoV-2 RNA is generally  detectable in upper and lower respiratory specimens during the acute phase of infection. Negative results do not preclude SARS-CoV-2 infection, do not rule out co-infections with other pathogens, and should not be used as the sole basis for treatment or other patient management decisions. Negative results must be combined with clinical observations, patient history, and epidemiological information. The expected result is Negative. Fact Sheet for Patients: SugarRoll.be Fact Sheet for Healthcare Providers: https://www.woods-mathews.com/ This test is not yet approved or cleared by the [place] FDA and  has been authorized for detection and/or diagnosis of SARS-CoV-2 by FDA under an Emergency Use Authorization (EUA). This EUA will remain  in effect (meaning this test can be used) for the duration of the COVID-19 declaration under Section 56 4(b)(1) of the Act, 21 U.S.C. section 360bbb-3(b)(1), unless the authorization is terminated or revoked sooner. Performed at [facility], [place] [address], [place], [place] [address]      Time coordinating discharge: Over 30 minutes  SIGNED:   Nolberto Hanlon, MD  [facility] 08/19/2019, 12:59 PM Pager   If 7PM-7AM, please contact night-coverage [email or web address] Password [email or web address]

## 2019-08-19 NOTE — NC FL2 (Signed)
[place] LEVEL OF CARE SCREENING TOOL     IDENTIFICATION  Patient Name: Danny Cook. Birthdate: 08/08/1960 Sex: male Admission Date (Current Location): 08/14/2019  [place] and [place] Number:  [name] [id number] [place] and Address:  [facility], [address] [place], [place] [address]      Provider Number: [id number]  Attending Physician Name and Address:  Nolberto Hanlon, MD  Relative Name and Phone Number:  Nur Kralik [phone]    Current Level of Care: Hospital(Family Care Home) Recommended Level of Care: Family Care Home Prior Approval Number:    Date Approved/Denied:   PASRR Number:    Discharge Plan: Other (Comment)(Return to [place])    Current Diagnoses: Patient Active Problem List   Diagnosis Date Noted  . Sepsis ([place]) 08/15/2019  . NSTEMI (non-ST elevated myocardial infarction) ([place]) 06/07/2018  . Lymphedema 05/12/2018  . Pleural effusion   . FUO (fever of unknown origin)   . MGUS (monoclonal gammopathy of unknown significance)   . 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 Healthcare associated bacterial pneumonia 04/25/2018  . Community acquired pneumonia 04/12/2018  . Pneumonia 04/12/2018  . AKI (acute kidney injury) ([place]) 03/23/2018  . Pericarditis with effusion 03/23/2018  . Schizoaffective disorder, depressive type ([place]) 11/04/2017  . HIV positive ([place]) 11/04/2017  . Orthostatic dizziness 07/31/2017  . Chronic diastolic heart failure ([place]) 07/29/2017  . HTN (hypertension) 07/29/2017  . Diabetes ([place]) 07/29/2017  . Schizophrenia ([place]) 07/29/2017    Orientation RESPIRATION BLADDER Height & Weight     Self, Time, Situation, Place  Normal Continent Weight: 216 lb (98 kg) Height:  5\' 7"  (170.2 cm)  BEHAVIORAL SYMPTOMS/MOOD NEUROLOGICAL BOWEL NUTRITION STATUS      Continent Diet(Heart healthy-thin liquids)  AMBULATORY STATUS COMMUNICATION OF NEEDS Skin    Independent Verbally Normal                       Personal Care Assistance Level of Assistance  Dressing Bathing Assistance: Independent   Dressing Assistance: Limited assistance     Functional Limitations Info             SPECIAL CARE FACTORS FREQUENCY                       Contractures Contractures Info: Not present    Additional Factors Info  Code Status, Allergies Code Status Info: Full Code Allergies Info: Trazodone, Viread, Etodolac           Current Medications (08/19/2019):  This is the current hospital active medication list Current Facility-Administered Medications  Medication Dose Route Frequency Provider Last Rate Last Admin  . abacavir-dolutegravir-lamiVUDine (TRIUMEQ) [id number] MG per tablet 1 tablet  1 tablet Oral QHS Berton Mount, RPH   1 tablet at 08/18/19 2103  . acetaminophen (TYLENOL) tablet 650 mg  650 mg Oral Q6H PRN Athena Masse, MD   650 mg at 08/15/19 0630   Or  . acetaminophen (TYLENOL) suppository 650 mg  650 mg Rectal Q6H PRN Athena Masse, MD      . aspirin tablet 325 mg  325 mg Oral Daily Wyvonnia Dusky, MD   325 mg at 08/19/19 1009  . ceFEPIme (MAXIPIME) 2 g in sodium chloride 0.9 % 100 mL IVPB  2 g Intravenous Q8H Amery, Sahar, MD 200 mL/hr at 08/19/19 1246 2 g at 08/19/19 1246  . Chlorhexidine Gluconate Cloth 2 % PADS 6 each  6 each Topical [id number] Nolberto Hanlon, MD   6 each at 08/18/19 [phone]  . dextrose 10 % infusion   Intravenous Continuous Wyvonnia Dusky, MD   Stopped at 08/18/19 1316  . diazepam (VALIUM) tablet 5 mg  5 mg Oral BID PRN Wyvonnia Dusky, MD   5 mg at 08/19/19 1025  . diphenhydrAMINE (BENADRYL) capsule 50 mg  50 mg Oral QHS Wyvonnia Dusky, MD   50 mg at 08/18/19 2102  . divalproex (DEPAKOTE ER) 24 hr tablet 500 mg  500 mg Oral Daily Wyvonnia Dusky, MD   500 mg at 08/19/19 1010  . donepezil (ARICEPT) tablet 5 mg  5 mg Oral QHS Wyvonnia Dusky, MD   5 mg at 08/18/19 2102  .  enoxaparin (LOVENOX) injection 40 mg  40 mg Subcutaneous Q24H Judd Gaudier V, MD   40 mg at 08/19/19 1014  . ferrous sulfate tablet 325 mg  325 mg Oral Q breakfast Wyvonnia Dusky, MD   325 mg at 08/19/19 1011  . gabapentin (NEURONTIN) capsule 300 mg  300 mg Oral TID Wyvonnia Dusky, MD   300 mg at 08/19/19 1011  . gemfibrozil (LOPID) tablet 600 mg  600 mg Oral BID AC Wyvonnia Dusky, MD   600 mg at 08/19/19 1008  . haloperidol (HALDOL) tablet 5 mg  5 mg Oral BID Wyvonnia Dusky, MD   5 mg at 08/19/19 1007  . insulin aspart (novoLOG) injection 0-15 Units  0-15 Units Subcutaneous TID WC Athena Masse, MD   2 Units at 08/18/19 1211  . insulin aspart (novoLOG) injection 0-5 Units  0-5 Units Subcutaneous QHS Judd Gaudier V, MD      . ipratropium-albuterol (DUONEB) 0.5-2.5 (3) MG/3ML nebulizer solution 3 mL  3 mL Nebulization Q4H PRN Wyvonnia Dusky, MD      . ipratropium-albuterol (DUONEB) 0.5-2.5 (3) MG/3ML nebulizer solution 3 mL  3 mL Nebulization Q6H PRN Wyvonnia Dusky, MD   3 mL at 08/15/19 1416  . losartan (COZAAR) tablet 25 mg  25 mg Oral Daily Wyvonnia Dusky, MD   25 mg at 08/19/19 1013  . memantine (NAMENDA) tablet 10 mg  10 mg Oral BID Wyvonnia Dusky, MD   10 mg at 08/19/19 1012  . metoprolol tartrate (LOPRESSOR) tablet 12.5 mg  12.5 mg Oral BID Wyvonnia Dusky, MD   12.5 mg at 08/19/19 1013  . metroNIDAZOLE (FLAGYL) tablet 500 mg  500 mg Oral TID Hart Robinsons A, RPH   500 mg at 08/19/19 1008  . mupirocin ointment (BACTROBAN) 2 % 1 application  1 application Nasal BID Nolberto Hanlon, MD   1 application at [id number]  . promethazine (PHENERGAN) tablet 12.5 mg  12.5 mg Oral Q6H PRN Athena Masse, MD   12.5 mg at 08/18/19 2103  . venlafaxine XR (EFFEXOR-XR) 24 hr capsule 75 mg  75 mg Oral Daily Wyvonnia Dusky, MD   75 mg at 08/19/19 1010     Discharge Medications:  Medication List        STOP taking these medications       bumetanide 1  MG tablet Commonly known as: BUMEX   diphenhydrAMINE 50 MG capsule Commonly known as: BENADRYL   promethazine 6.25 MG/5ML syrup Commonly known as: PHENERGAN             TAKE these medications       acetaminophen 325 MG tablet Commonly known as: TYLENOL Take 2  tablets (650 mg total) by mouth every 6 (six) hours as needed for fever (Fever >101).   albuterol 108 (90 Base) MCG/ACT inhaler Commonly known as: VENTOLIN HFA Inhale 2 puffs into the lungs every 6 (six) hours as needed for wheezing or shortness of breath.   aspirin EC 325 MG tablet Take 325 mg by mouth daily.   butalbital-aspirin-caffeine 50-325-40 MG capsule Commonly known as: FIORINAL Take 1-2 capsules by mouth every 6 (six) hours as needed for headache.   cyanocobalamin 1000 MCG tablet Take 1,000 mcg by mouth daily.   diazepam 5 MG tablet Commonly known as: VALIUM Take 5 mg by mouth 2 (two) times daily as needed for anxiety.   divalproex 500 MG 24 hr tablet Commonly known as: DEPAKOTE ER Take 500 mg by mouth at bedtime.   donepezil 5 MG tablet Commonly known as: ARICEPT Take 5 mg by mouth daily.   Fanapt 12 MG Tabs Generic drug: Iloperidone Take 12 mg by mouth 2 (two) times daily.   feeding supplement (GLUCERNA SHAKE) Liqd Take 237 mLs by mouth daily.   ferrous sulfate 325 (65 FE) MG tablet Take 325 mg by mouth daily with breakfast.   Fish Oil 1000 MG Caps Take 1,000 mg by mouth daily.   fluticasone 220 MCG/ACT inhaler Commonly known as: Flovent HFA Inhale 2 puffs into the lungs 2 (two) times daily. Rinse out mouth afterwards   fluticasone 50 MCG/ACT nasal spray Commonly known as: FLONASE Place 2 sprays into both nostrils 2 (two) times daily as needed for allergies.   gabapentin 300 MG capsule Commonly known as: NEURONTIN Take 300 mg by mouth 3 (three) times daily.   gemfibrozil 600 MG tablet Commonly known as: LOPID Take 600 mg by mouth 2 (two) times daily before a  meal.   glimepiride 4 MG tablet Commonly known as: AMARYL Take 4 mg by mouth daily with breakfast.   haloperidol 5 MG tablet Commonly known as: HALDOL Take 5 mg by mouth 2 (two) times daily.   losartan 25 MG tablet Commonly known as: COZAAR Take 1 tablet (25 mg total) by mouth daily.   memantine 10 MG tablet Commonly known as: NAMENDA Take 10 mg by mouth 2 (two) times daily.   metFORMIN 1000 MG tablet Commonly known as: GLUCOPHAGE Take 1,000 mg by mouth 2 (two) times daily with a meal.   metoprolol tartrate 25 MG tablet Commonly known as: LOPRESSOR Take 0.5 tablets (12.5 mg total) by mouth 2 (two) times daily.   mirtazapine 30 MG tablet Commonly known as: REMERON Take 30 mg by mouth at bedtime.   nitroGLYCERIN 0.4 MG SL tablet Commonly known as: NITROSTAT Place 0.4 mg under the tongue every 5 (five) minutes as needed for chest pain.   NovoLOG FlexPen 100 UNIT/ML FlexPen Generic drug: insulin aspart Inject 6 Units into the skin 3 (three) times daily as needed for high blood sugar (blood sugar over 100). Add sliding scale if sugar over 100; 200-250=2 units, 251-300=4 units, 301-350=6 units, 351-400= 8 units, over 400=10 units.   omeprazole 40 MG capsule Commonly known as: PRILOSEC Take 40 mg by mouth 2 (two) times daily.   Ozempic (0.25 or 0.5 MG/DOSE) 2 MG/1.5ML Sopn Generic drug: Semaglutide(0.25 or 0.5MG /DOS) Inject 0.5 mg into the skin once a week.   rosuvastatin 40 MG tablet Commonly known as: CRESTOR Take 40 mg by mouth daily.   sucralfate 1 g tablet Commonly known as: CARAFATE Take 1 g by mouth 4 (four) times daily.  tamsulosin 0.4 MG Caps capsule Commonly known as: FLOMAX Take 0.4 mg by mouth daily.   Tyler Aas FlexTouch 200 UNIT/ML Sopn Generic drug: Insulin Degludec Inject 10 Units into the skin daily. What changed: how much to take   triamcinolone cream 0.1 % Commonly known as: KENALOG Apply 1 application topically 2 (two) times  daily as needed. What changed: when to take this   Triumeq 600-50-300 MG tablet Generic drug: abacavir-dolutegravir-lamiVUDine Take 1 tablet by mouth at bedtime.   venlafaxine XR 75 MG 24 hr capsule Commonly known as: EFFEXOR-XR Take 1 capsule (75 mg total) by mouth daily with breakfast.                        Medication List        STOP taking these medications       bumetanide 1 MG tablet Commonly known as: BUMEX   diphenhydrAMINE 50 MG capsule Commonly known as: BENADRYL   promethazine 6.25 MG/5ML syrup Commonly known as: PHENERGAN             TAKE these medications       acetaminophen 325 MG tablet Commonly known as: TYLENOL Take 2 tablets (650 mg total) by mouth every 6 (six) hours as needed for fever (Fever >101).   albuterol 108 (90 Base) MCG/ACT inhaler Commonly known as: VENTOLIN HFA Inhale 2 puffs into the lungs every 6 (six) hours as needed for wheezing or shortness of breath.   aspirin EC 325 MG tablet Take 325 mg by mouth daily.   butalbital-aspirin-caffeine 50-325-40 MG capsule Commonly known as: FIORINAL Take 1-2 capsules by mouth every 6 (six) hours as needed for headache.   cyanocobalamin 1000 MCG tablet Take 1,000 mcg by mouth daily.   diazepam 5 MG tablet Commonly known as: VALIUM Take 5 mg by mouth 2 (two) times daily as needed for anxiety.   divalproex 500 MG 24 hr tablet Commonly known as: DEPAKOTE ER Take 500 mg by mouth at bedtime.   donepezil 5 MG tablet Commonly known as: ARICEPT Take 5 mg by mouth daily.   Fanapt 12 MG Tabs Generic drug: Iloperidone Take 12 mg by mouth 2 (two) times daily.   feeding supplement (GLUCERNA SHAKE) Liqd Take 237 mLs by mouth daily.   ferrous sulfate 325 (65 FE) MG tablet Take 325 mg by mouth daily with breakfast.   Fish Oil 1000 MG Caps Take 1,000 mg by mouth daily.   fluticasone 220 MCG/ACT inhaler Commonly known as: Flovent HFA Inhale 2 puffs into the  lungs 2 (two) times daily. Rinse out mouth afterwards   fluticasone 50 MCG/ACT nasal spray Commonly known as: FLONASE Place 2 sprays into both nostrils 2 (two) times daily as needed for allergies.   gabapentin 300 MG capsule Commonly known as: NEURONTIN Take 300 mg by mouth 3 (three) times daily.   gemfibrozil 600 MG tablet Commonly known as: LOPID Take 600 mg by mouth 2 (two) times daily before a meal.   glimepiride 4 MG tablet Commonly known as: AMARYL Take 4 mg by mouth daily with breakfast.   haloperidol 5 MG tablet Commonly known as: HALDOL Take 5 mg by mouth 2 (two) times daily.   losartan 25 MG tablet Commonly known as: COZAAR Take 1 tablet (25 mg total) by mouth daily.   memantine 10 MG tablet Commonly known as: NAMENDA Take 10 mg by mouth 2 (two) times daily.   metFORMIN 1000 MG tablet Commonly known as: GLUCOPHAGE Take  1,000 mg by mouth 2 (two) times daily with a meal.   metoprolol tartrate 25 MG tablet Commonly known as: LOPRESSOR Take 0.5 tablets (12.5 mg total) by mouth 2 (two) times daily.   mirtazapine 30 MG tablet Commonly known as: REMERON Take 30 mg by mouth at bedtime.   nitroGLYCERIN 0.4 MG SL tablet Commonly known as: NITROSTAT Place 0.4 mg under the tongue every 5 (five) minutes as needed for chest pain.   NovoLOG FlexPen 100 UNIT/ML FlexPen Generic drug: insulin aspart Inject 6 Units into the skin 3 (three) times daily as needed for high blood sugar (blood sugar over 100). Add sliding scale if sugar over 100; 200-250=2 units, 251-300=4 units, 301-350=6 units, 351-400= 8 units, over 400=10 units.   omeprazole 40 MG capsule Commonly known as: PRILOSEC Take 40 mg by mouth 2 (two) times daily.   Ozempic (0.25 or 0.5 MG/DOSE) 2 MG/1.5ML Sopn Generic drug: Semaglutide(0.25 or 0.5MG /DOS) Inject 0.5 mg into the skin once a week.   rosuvastatin 40 MG tablet Commonly known as: CRESTOR Take 40 mg by mouth daily.   sucralfate 1  g tablet Commonly known as: CARAFATE Take 1 g by mouth 4 (four) times daily.   tamsulosin 0.4 MG Caps capsule Commonly known as: FLOMAX Take 0.4 mg by mouth daily.   Tyler Aas FlexTouch 200 UNIT/ML Sopn Generic drug: Insulin Degludec Inject 10 Units into the skin daily. What changed: how much to take   triamcinolone cream 0.1 % Commonly known as: KENALOG Apply 1 application topically 2 (two) times daily as needed. What changed: when to take this   Triumeq 600-50-300 MG tablet Generic drug: abacavir-dolutegravir-lamiVUDine Take 1 tablet by mouth at bedtime.   venlafaxine XR 75 MG 24 hr capsule Commonly known as: EFFEXOR-XR Take 1 capsule (75 mg total) by mouth daily with breakfast.                            Relevant Imaging Results:  Relevant Lab Results:   Additional Information SSN: [id number]  Izaiah Tabb, Gardiner Rhyme, LCSW

## 2019-08-19 NOTE — Progress Notes (Signed)
Just wanted to let you know Danny Cook's 134 paperwork is complete and in his chart. [employer] owner reports they have a contract with [name] and you need to call them and they will take back to [employer] once ready to DC. Thanks I put the number for [employer] on the packet for him to take back with him. ok Thank you.

## 2019-09-02 ENCOUNTER — Other Ambulatory Visit: Payer: Medicare Other

## 2019-09-05 ENCOUNTER — Other Ambulatory Visit: Payer: Self-pay

## 2019-09-05 ENCOUNTER — Encounter: Payer: Self-pay | Admitting: Family

## 2019-09-05 ENCOUNTER — Ambulatory Visit: Payer: Medicare Other | Attending: Family | Admitting: Family

## 2019-09-05 VITALS — BP 131/76 | HR 57 | Resp 16 | Ht 67.0 in | Wt 208.0 lb

## 2019-09-05 DIAGNOSIS — E7800 Pure hypercholesterolemia, unspecified: Secondary | ICD-10-CM | POA: Insufficient documentation

## 2019-09-05 DIAGNOSIS — E876 Hypokalemia: Secondary | ICD-10-CM | POA: Diagnosis not present

## 2019-09-05 DIAGNOSIS — F419 Anxiety disorder, unspecified: Secondary | ICD-10-CM | POA: Diagnosis not present

## 2019-09-05 DIAGNOSIS — Z8249 Family history of ischemic heart disease and other diseases of the circulatory system: Secondary | ICD-10-CM | POA: Diagnosis not present

## 2019-09-05 DIAGNOSIS — I5032 Chronic diastolic (congestive) heart failure: Secondary | ICD-10-CM | POA: Diagnosis present

## 2019-09-05 DIAGNOSIS — I130 Hypertensive heart and chronic kidney disease with heart failure and stage 1 through stage 4 chronic kidney disease, or unspecified chronic kidney disease: Secondary | ICD-10-CM | POA: Diagnosis not present

## 2019-09-05 DIAGNOSIS — K219 Gastro-esophageal reflux disease without esophagitis: Secondary | ICD-10-CM | POA: Insufficient documentation

## 2019-09-05 DIAGNOSIS — Z7951 Long term (current) use of inhaled steroids: Secondary | ICD-10-CM | POA: Diagnosis not present

## 2019-09-05 DIAGNOSIS — E1122 Type 2 diabetes mellitus with diabetic chronic kidney disease: Secondary | ICD-10-CM | POA: Insufficient documentation

## 2019-09-05 DIAGNOSIS — Z21 Asymptomatic human immunodeficiency virus [HIV] infection status: Secondary | ICD-10-CM | POA: Insufficient documentation

## 2019-09-05 DIAGNOSIS — G4733 Obstructive sleep apnea (adult) (pediatric): Secondary | ICD-10-CM | POA: Diagnosis not present

## 2019-09-05 DIAGNOSIS — E785 Hyperlipidemia, unspecified: Secondary | ICD-10-CM | POA: Diagnosis not present

## 2019-09-05 DIAGNOSIS — F329 Major depressive disorder, single episode, unspecified: Secondary | ICD-10-CM | POA: Diagnosis not present

## 2019-09-05 DIAGNOSIS — F200 Paranoid schizophrenia: Secondary | ICD-10-CM | POA: Insufficient documentation

## 2019-09-05 DIAGNOSIS — Z7982 Long term (current) use of aspirin: Secondary | ICD-10-CM | POA: Diagnosis not present

## 2019-09-05 DIAGNOSIS — Z794 Long term (current) use of insulin: Secondary | ICD-10-CM | POA: Insufficient documentation

## 2019-09-05 DIAGNOSIS — Z79899 Other long term (current) drug therapy: Secondary | ICD-10-CM | POA: Diagnosis not present

## 2019-09-05 DIAGNOSIS — Z8673 Personal history of transient ischemic attack (TIA), and cerebral infarction without residual deficits: Secondary | ICD-10-CM | POA: Diagnosis not present

## 2019-09-05 DIAGNOSIS — N189 Chronic kidney disease, unspecified: Secondary | ICD-10-CM | POA: Diagnosis not present

## 2019-09-05 DIAGNOSIS — M1990 Unspecified osteoarthritis, unspecified site: Secondary | ICD-10-CM | POA: Insufficient documentation

## 2019-09-05 DIAGNOSIS — E119 Type 2 diabetes mellitus without complications: Secondary | ICD-10-CM

## 2019-09-05 DIAGNOSIS — I10 Essential (primary) hypertension: Secondary | ICD-10-CM

## 2019-09-05 NOTE — Progress Notes (Signed)
Patient ID: Danny Cook., male    DOB: 01/25/1961, 60 y.o.   MRN: [id number]  HPI  Danny Cook is a 59 y/o male with a history of DM, hyperlipidemia, HTN, CKD, HIV, schizophrenia, obstructive sleep apnea, anxiety, osteoarthritis and chronic heart failure.   Echo report from 06/08/18 reviewed and showed an EF of 55-60%. Echo report from 03/25/18 reviewed and showed an EF of 55-60%. Echo report from 07/31/17 reviewed and showed an EF of 55-65%. Echo report from 05/11/17 reviewed and showed an EF of 55% along with mild AR/ TR/ Danny.  Admitted 08/14/19 due to sepsis due to pneumonia. Became hypotensive with elevated lactic acid. Was given IV antibiotics and IV fluids. Potassium replaced due to hypokalemia. Discharged after 5 days with PT/OT.    He presents today for a follow-up visit with a chief complaint of minimal shortness of breath upon moderate exertion. He describes this as chronic in nature having been present for several years. He has associated fatigue and depression along with this. He denies any abdominal distention, palpitations, pedal edema, chest pain, cough, dizziness or weight gain.   Past Medical History:  Diagnosis Date  . Anemia   . Anxiety   . Arthritis   . CHF (congestive heart failure) ([place])   . Chronic kidney disease    Renal Insufficiency Syndrome; Glomerulosclerosis 2013  . Depressed   . Diabetes mellitus without complication ([place])   . GERD (gastroesophageal reflux disease)   . High cholesterol   . HIV (human immunodeficiency virus infection) ([place])   . Hypertension   . 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 Paranoid disorder ([place])   . Pneumonia   . Schizophrenia, paranoid ([place])   . Sleep apnea   . TIA (transient ischemic attack)    Past Surgical History:  Procedure Laterality Date  . CARDIAC CATHETERIZATION    . COLONOSCOPY WITH PROPOFOL N/A 09/03/2015   Procedure: COLONOSCOPY WITH PROPOFOL;  Surgeon: Lollie Sails, MD;  Location: [facility] ENDOSCOPY;  Service:  Endoscopy;  Laterality: N/A;  . DG TEETH FULL    . ESOPHAGOGASTRODUODENOSCOPY (EGD) WITH PROPOFOL N/A 07/01/2016   Procedure: ESOPHAGOGASTRODUODENOSCOPY (EGD) WITH PROPOFOL;  Surgeon: Lollie Sails, MD;  Location: [facility] ENDOSCOPY;  Service: Endoscopy;  Laterality: N/A;   Family History  Problem Relation Age of Onset  . Heart attack Mother   . Diabetes Mellitus II Mother   . Mental illness Mother   . CAD Mother   . Heart attack Father   . CAD Father   . Hypertension Father    Social History   Tobacco Use  . Smoking status: Never Smoker  . Smokeless tobacco: Never Used  Substance Use Topics  . Alcohol use: No   Allergies  Allergen Reactions  . Trazodone And Nefazodone     Affects other meds that they dont work  . Trazodone Hcl Other (See Comments)  . Viread [Tenofovir Disoproxil]     Damages kidneys  . Etodolac Rash   Prior to Admission medications   Medication Sig Start Date End Date Taking? Authorizing Provider  abacavir-dolutegravir-lamiVUDine (TRIUMEQ) 600-50-300 MG tablet Take 1 tablet by mouth at bedtime. 07/21/19  Yes Tsosie Billing, MD  acetaminophen (TYLENOL) 325 MG tablet Take 2 tablets (650 mg total) by mouth every 6 (six) hours as needed for fever (Fever >101). 05/03/18  Yes Vaughan Basta, MD  albuterol (PROVENTIL HFA;VENTOLIN HFA) 108 (90 Base) MCG/ACT inhaler Inhale 2 puffs into the lungs every 6 (six) hours as needed for wheezing  or shortness of breath. 04/14/18  Yes Loletha Grayer, MD  aspirin EC 325 MG tablet Take 325 mg by mouth daily.   Yes [provider]  butalbital-aspirin-caffeine Acquanetta Chain) 50-325-40 MG capsule Take 1-2 capsules by mouth every 6 (six) hours as needed for headache.    Yes [provider]  cyanocobalamin 1000 MCG tablet Take 1,000 mcg by mouth daily.    Yes [provider]  diazepam (VALIUM) 5 MG tablet Take 5 mg by mouth 2 (two) times daily as needed for anxiety.    Yes [provider]   divalproex (DEPAKOTE ER) 500 MG 24 hr tablet Take 500 mg by mouth at bedtime.    Yes [provider]  donepezil (ARICEPT) 5 MG tablet Take 5 mg by mouth daily.  07/19/19  Yes [provider]  FANAPT 12 MG TABS Take 12 mg by mouth 2 (two) times daily.  11/14/16  Yes [provider]  feeding supplement, GLUCERNA SHAKE, (GLUCERNA SHAKE) LIQD Take 237 mLs by mouth daily.   Yes [provider]  ferrous sulfate 325 (65 FE) MG tablet Take 325 mg by mouth daily with breakfast.   Yes [provider]  fluticasone (FLONASE) 50 MCG/ACT nasal spray Place 2 sprays into both nostrils 2 (two) times daily as needed for allergies.    Yes [provider]  fluticasone (FLOVENT HFA) 220 MCG/ACT inhaler Inhale 2 puffs into the lungs 2 (two) times daily. Rinse out mouth afterwards 04/14/18  Yes Wieting, Richard, MD  gabapentin (NEURONTIN) 300 MG capsule Take 300 mg by mouth 3 (three) times daily.   Yes [provider]  gemfibrozil (LOPID) 600 MG tablet Take 600 mg by mouth 2 (two) times daily before a meal.   Yes [provider]  glimepiride (AMARYL) 4 MG tablet Take 4 mg by mouth daily with breakfast.  06/06/19  Yes [provider]  haloperidol (HALDOL) 5 MG tablet Take 5 mg by mouth 2 (two) times daily.    Yes [provider]  Insulin Degludec (TRESIBA FLEXTOUCH) 200 UNIT/ML SOPN Inject 10 Units into the skin daily. Patient taking differently: Inject 50 Units into the skin daily.  08/19/19  Yes Nolberto Hanlon, MD  losartan (COZAAR) 25 MG tablet Take 1 tablet (25 mg total) by mouth daily. 05/04/18  Yes Vaughan Basta, MD  memantine (NAMENDA) 10 MG tablet Take 10 mg by mouth 2 (two) times daily.   Yes [provider]  metFORMIN (GLUCOPHAGE) 1000 MG tablet Take 1,000 mg by mouth 2 (two) times daily with a meal.   Yes [provider]  metoprolol tartrate (LOPRESSOR) 25 MG tablet Take 0.5 tablets (12.5 mg total) by  mouth 2 (two) times daily. 05/03/18  Yes Vaughan Basta, MD  mirtazapine (REMERON) 30 MG tablet Take 30 mg by mouth at bedtime. 04/21/18  Yes [provider]  nitroGLYCERIN (NITROSTAT) 0.4 MG SL tablet Place 0.4 mg under the tongue every 5 (five) minutes as needed for chest pain.    Yes [provider]  NOVOLOG FLEXPEN 100 UNIT/ML FlexPen Inject 6 Units into the skin 3 (three) times daily as needed for high blood sugar (blood sugar over 100). Add sliding scale if sugar over 100; 200-250=2 units, 251-300=4 units, 301-350=6 units, 351-400= 8 units, over 400=10 units. 03/16/19  Yes [provider]  Omega-3 Fatty Acids (FISH OIL) 1000 MG CAPS Take 1,000 mg by mouth daily.   Yes [provider]  omeprazole (PRILOSEC) 40 MG capsule Take 40 mg  by mouth 2 (two) times daily.    Yes [provider]  OZEMPIC, 0.25 OR 0.5 MG/DOSE, 2 MG/1.5ML SOPN Inject 0.5 mg into the skin once a week. 06/06/19  Yes [provider]  rosuvastatin (CRESTOR) 40 MG tablet Take 40 mg by mouth daily.    Yes [provider]  sucralfate (CARAFATE) 1 g tablet Take 1 g by mouth 4 (four) times daily.  04/21/18  Yes [provider]  tamsulosin (FLOMAX) 0.4 MG CAPS capsule Take 0.4 mg by mouth daily. 08/02/19  Yes [provider]  triamcinolone cream (KENALOG) 0.1 % Apply 1 application topically 2 (two) times daily as needed. Patient taking differently: Apply 1 application topically 2 (two) times daily.  11/16/16  Yes Little, Traci M, PA-C  venlafaxine XR (EFFEXOR-XR) 75 MG 24 hr capsule Take 1 capsule (75 mg total) by mouth daily with breakfast. 11/09/17  Yes McNew, Tyson Babinski, MD     Review of Systems  Constitutional: Positive for fatigue. Negative for appetite change.  HENT: Positive for rhinorrhea. Negative for congestion, postnasal drip and sore throat.   Eyes: Negative.   Respiratory: Positive for shortness of breath (with moderate exertion). Negative  for cough and chest tightness.   Cardiovascular: Negative for chest pain, palpitations and leg swelling.  Gastrointestinal: Negative for abdominal distention and abdominal pain.  Endocrine: Negative.   Genitourinary: Negative.   Musculoskeletal: Positive for back pain (when standing for long periods of time). Negative for neck pain.  Skin: Negative.   Allergic/Immunologic: Negative.   Neurological: Negative for dizziness and light-headedness.  Hematological: Negative for adenopathy. Does not bruise/bleed easily.  Psychiatric/Behavioral: Positive for dysphoric mood. Negative for sleep disturbance. The patient is nervous/anxious.    Vitals:   09/05/19 1137  BP: 131/76  Pulse: (!) 57  Resp: 16  SpO2: 99%  Weight: 208 lb (94.3 kg)  Height: 5\' 7"  (1.702 m)   Wt Readings from Last 3 Encounters:  09/05/19 208 lb (94.3 kg)  08/14/19 216 lb (98 kg)  07/21/19 219 lb (99.3 kg)   Lab Results  Component Value Date   CREATININE 1.02 08/19/2019   CREATININE 1.22 08/18/2019   CREATININE 1.10 08/17/2019     Physical Exam  Constitutional: He is oriented to person, place, and time. He appears well-developed and well-nourished.  HENT:  Head: Normocephalic and atraumatic.  Neck: No JVD present.  Cardiovascular: Normal rate and regular rhythm.  Pulmonary/Chest: Effort normal. He has no wheezes. He has no rales.  Abdominal: Soft. He exhibits no distension. There is no abdominal tenderness.  Musculoskeletal:        General: Edema (trace pitting edema in left lower leg) present. No tenderness.     Cervical back: Normal range of motion and neck supple.  Neurological: He is alert and oriented to person, place, and time.  Skin: Skin is warm and dry.  Psychiatric: He has a normal mood and affect. His behavior is normal. Thought content normal.  Nursing note and vitals reviewed.  Assessment & Plan:  1: Chronic heart failure with preserved ejection fraction- - NYHA class II - euvolemic  today - weighing daily; reminded to call for an overnight weight gain of >2 pounds or a weekly weight gain of >5 pounds - weight down 13 pounds from last visit here 6 months ago - not adding salt to his food and doesn't think the food at the [facility] tastes salty - saw cardiology Minette Brine) 07/06/19 - BNP 03/29/18 was  235.0 - patient says that he's received his flu vaccine and both his COVID vaccines  2: HTN- - BP looks good today - BMP from 08/19/19 reviewed and showed sodium 138, potassium 3.7, creatinine 1.02 and GFR >60 - saw PCP (Hande) 08/25/19  3: Diabetes- - A1c 12/06/2018 was 7.9% - saw endocrinologist Jenetta DownerDina Rich) 08/11/19 - saw nephrologist Ardyth Man) 06/06/2019 - glucose at the facility this morning was [place] medication list was reviewed.   Return in 6 months or sooner for any questions/problems before then.

## 2019-09-05 NOTE — Patient Instructions (Signed)
Continue weighing daily and call for an overnight weight gain of > 2 pounds or a weekly weight gain of >5 pounds. 

## 2019-09-06 ENCOUNTER — Ambulatory Visit: Payer: Medicare Other | Admitting: Family

## 2019-09-12 ENCOUNTER — Other Ambulatory Visit: Payer: Medicare Other

## 2019-09-30 ENCOUNTER — Other Ambulatory Visit: Payer: Self-pay

## 2019-09-30 ENCOUNTER — Encounter
Admission: RE | Admit: 2019-09-30 | Discharge: 2019-09-30 | Disposition: A | Discharge status: Home / Self Care | Payer: Medicare Other | Source: Ambulatory Visit | Attending: Neurosurgery | Admitting: Neurosurgery

## 2019-09-30 DIAGNOSIS — Z01818 Encounter for other preprocedural examination: Secondary | ICD-10-CM | POA: Insufficient documentation

## 2019-09-30 NOTE — Pre-Procedure Instructions (Signed)
Pre-Admit Testing Provider Communication Note  Provider:  Dr. Ubaldo Glassing  Notification Mode: Secure Chat  Reason: ASA and Cardiac Clearance  Response:    Additional Information: Noted. Placed on chart.  Signed: Beulah Gandy, RN

## 2019-09-30 NOTE — Pre-Procedure Instructions (Signed)
Pre-Admit Testing Provider Notification Note  Provider Notified: Dr. Ginette Pitman (PCP)  Notification Mode: Fax  Reason: Medical Clearance Request r/t hospitalization for pneumonia and sepsis on 08/14/19 and subsequent onset of fever, chills, dizziness, fatigue 09/19/19.  Response: Fax confirmation received.  Additional Information: Placed on chart. Noted on Pre-Admit Worksheet.  Signed: Beulah Gandy, RN

## 2019-09-30 NOTE — Patient Instructions (Addendum)
Your procedure is scheduled on: Wednesday 10/12/19.   Report to DAY SURGERY DEPARTMENT LOCATED ON 2ND FLOOR MEDICAL MALL ENTRANCE. To find out your arrival time please call [phone] between 1PM - 3PM on Tuesday 10/11/19.  I left a voice message for Dr. Rhea Bleacher nurse about your concerns about scheduling transportation 3 days in advance and needing an arrival time prior to the day before your surgery. You should hear from Dr. Rhea Bleacher office regarding this.   Remember: Instructions that are not followed completely may result in serious medical risk, up to and including death, or upon the discretion of your surgeon and anesthesiologist your surgery may need to be rescheduled.      _X__ 1. Do not eat food after midnight the night before your procedure.                 No gum chewing or hard candies. You may drink SUGAR FREE clear liquids SUCH AS WATER OR GATORADE ZERO up to 2 hours                 before you are scheduled to arrive for your surgery- DO NOT drink clear                 liquids within 2 hours of the start of your surgery.                    __X__2.  On the morning of surgery brush your teeth with toothpaste and water, you may rinse your mouth with mouthwash if you wish.  Do not swallow any toothpaste or mouthwash.       _X__ 3.  No Alcohol for 24 hours before or after surgery.     _X__ 4.  Do Not Smoke or use e-cigarettes For 24 Hours Prior to Your Surgery.                 Do not use any chewable tobacco products for at least 6 hours prior to                 surgery.     __X__5.  Notify your doctor if there is any change in your medical condition      (cold, fever, infections).       Do not wear jewelry, make-up, hairpins, clips or nail polish. Do not wear lotions, powders, or perfumes.  Do not shave 48 hours prior to surgery. Men may shave face and neck. Do not bring valuables to the hospital.     [facility] is not responsible for any belongings or  valuables.    Contacts, dentures/partials or body piercings may not be worn into surgery. Bring a case for your contacts, glasses or hearing aids, a denture cup will be supplied.    Patients discharged the day of surgery will not be allowed to drive home.    __X__ Take these medicines the morning of surgery with A SIP OF WATER:     1. albuterol (PROVENTIL HFA;VENTOLIN HFA)  2. fluticasone (FLOVENT HFA)   3. FANAPT  4. gabapentin (NEURONTIN)   5. LOPID  6. haloperidol (HALDOL)  7. NAMENDA  8. metoprolol tartrate   9. omeprazole (PRILOSEC)  10. sucralfate (CARAFATE)       __X__ Use CHG Soap as directed    __X__ Use inhalers on the day of surgery. Also bring the inhaler with you to the hospital on the morning of surgery.    __X__ Stop Metformin  2 days prior to surgery. Your last dose will be on Sunday evening 10/09/19.    __X__ Take 1/2 of usual insulin dose the night before surgery. No insulin the morning of surgery.    __X__ Stop Blood Thinners: Aspirin according to Dr. Bethanne Ginger instructions: 5 days prior to your procedure. Your last dose will be on Friday 10/07/19.   __X__ Stop Anti-inflammatories 7 days before surgery such as Advil, Ibuprofen, Motrin, BC or Goodies Powder, Naprosyn, Naproxen, Aleve, Meloxicam. May take Tylenol if needed for pain or discomfort.    Please do not take butalbital-aspirin-caffeine [facility]) 50-325-40 MG capsule after Tuesday 10/04/19.    __X__ Stop the following herbal supplements 7 days prior to your procedure:   Omega-3 Fatty Acids (FISH OIL)

## 2019-09-30 NOTE — Pre-Procedure Instructions (Signed)
Patient resides at [facility]. I confirmed home medication schedule with Neoma Laming at [facility] as they administer his medications. She faxed his medication administration record.   Instructions for surgery faxed to [facility] at [phone].  Fax confirmation received.

## 2019-10-03 NOTE — Pre-Procedure Instructions (Signed)
Notified [facility] supervisor of surgery arrival time for patient. He is to arrive at 9:00am on Wednesday 10/12/19. His surgery time will not be moved related to his transportation circumstances. Questions were encouraged and answered. Understanding was verbalized.

## 2019-10-06 IMAGING — MR MR MRA HEAD W/O CM
10 of 11 series · 34 of 48 positions shown · non-contrast
Comparison: Prior CT from 03/25/2018 as well as previous MRI from
06/09/2016.

CLINICAL DATA: Initial evaluation for acute altered mental status,
unclear cause.

EXAM:
MRI HEAD WITHOUT CONTRAST
MRA HEAD WITHOUT CONTRAST
TECHNIQUE: Multiplanar, multiecho pulse sequences of the brain and surrounding
structures were obtained without intravenous contrast. Angiographic
images of the head were obtained using MRA technique without
contrast.

[Series 2: ax dwi_tracew · axial · 3.0mm · 0.83mm/px · z∈[-58,+104]mm · 5 of 55 slices shown]
[im 1/55]
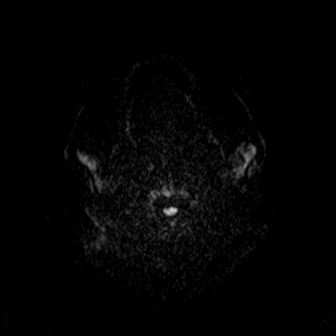
[im 14/55]
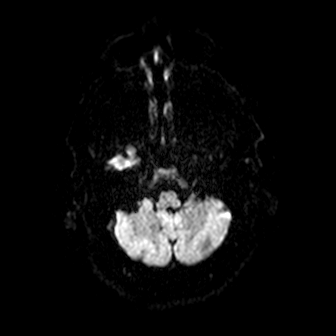
[im 28/55]
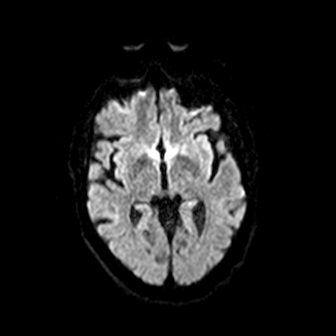
[im 41/55]
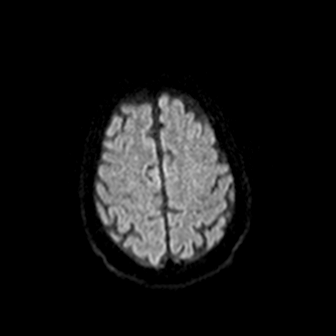
[im 55/55]
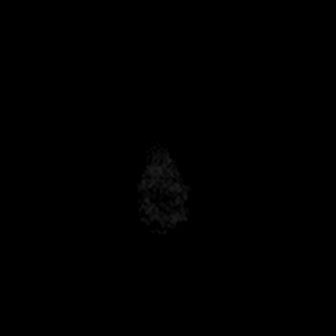

[Series 3: ax dwi_adc · axial · 3.0mm · 0.83mm/px · z∈[-58,+104]mm · 5 of 55 slices shown]
[im 1/55]
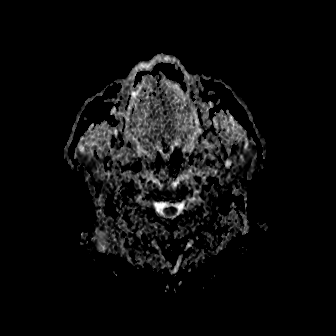
[im 14/55]
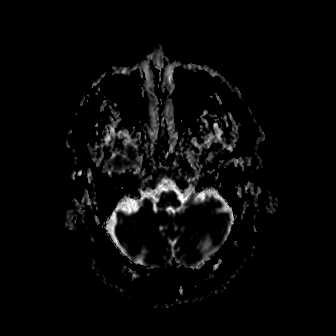
[im 28/55]
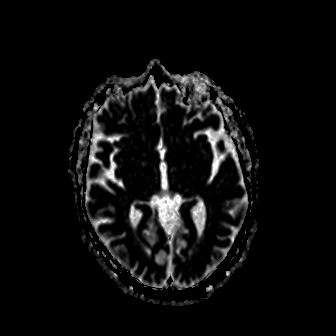
[im 41/55]
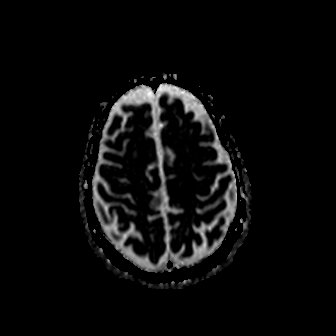
[im 55/55]
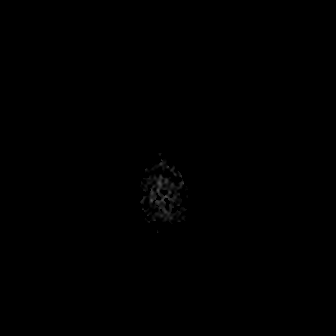

[Series 4: cor dwi_tracew · coronal · 5.0mm · 0.68mm/px · 3 of 37 slices shown]
[im 1/37]
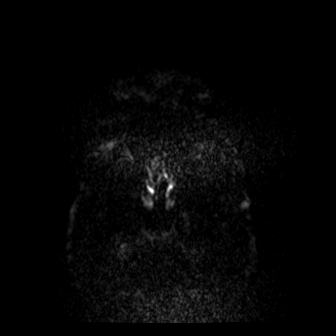
[im 19/37]
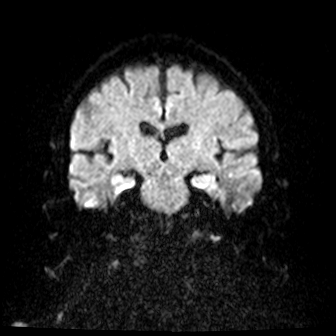
[im 37/37]
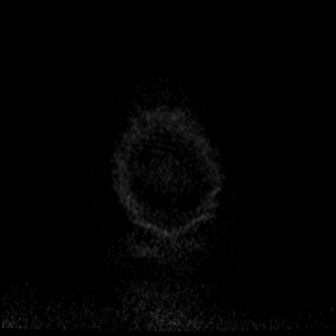

[Series 5: cor dwi_adc · coronal · 5.0mm · 0.68mm/px · 4 of 37 slices shown]
[im 1/37]
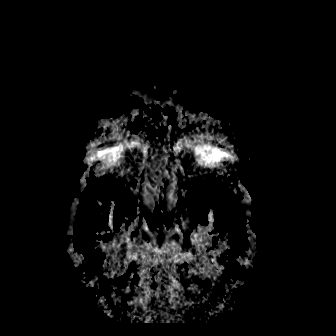
[im 13/37]
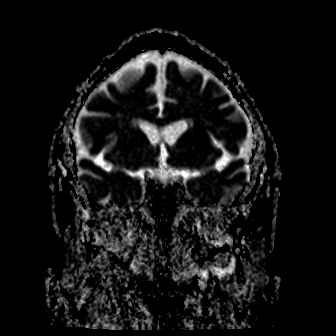
[im 25/37]
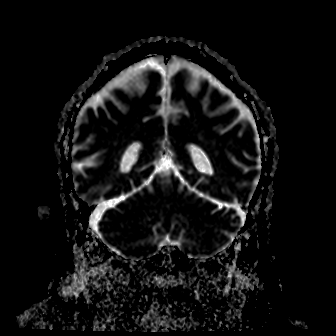
[im 37/37]
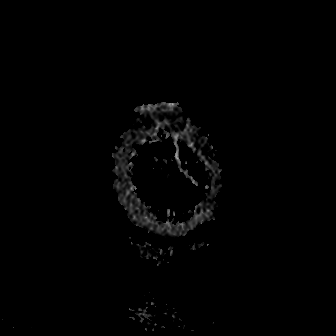

[Series 6: T1 · sagittal · 5.0mm · 0.94mm/px · 2 of 21 slices shown (1 of 2)]
[im 1/21]
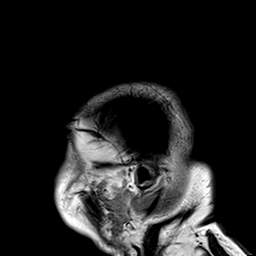
[im 21/21]
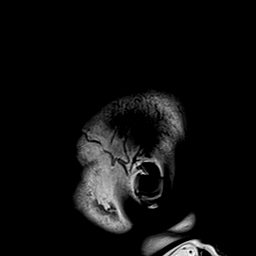

[Series 12: FLAIR · axial · 5.0mm · 1.20mm/px · z∈[-51,+93]mm · 3 of 25 slices shown]
[im 1/25]
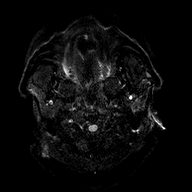
[im 13/25]
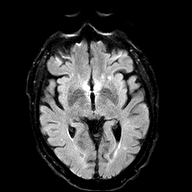
[im 25/25]
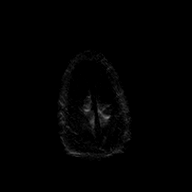

[Series 13: T2-star · axial · 5.0mm · 0.45mm/px · z∈[-50,+94]mm · 3 of 25 slices shown]
[im 1/25]
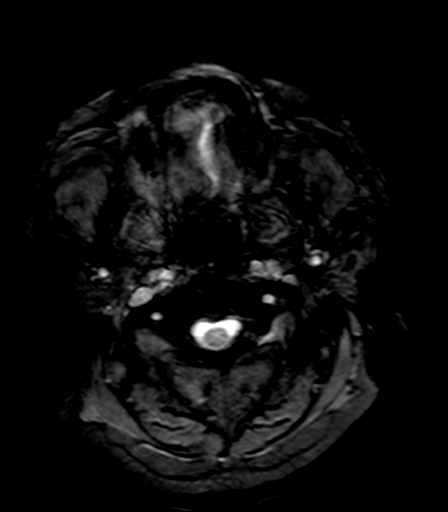
[im 13/25]
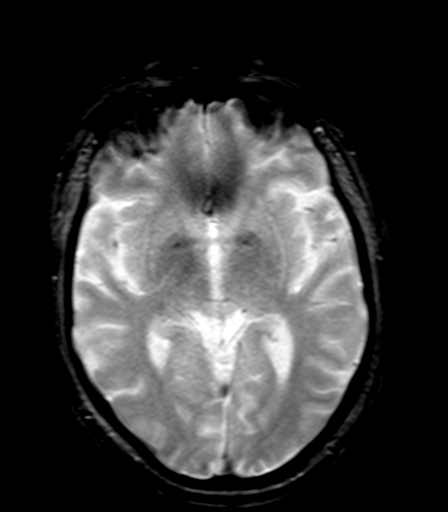
[im 25/25]
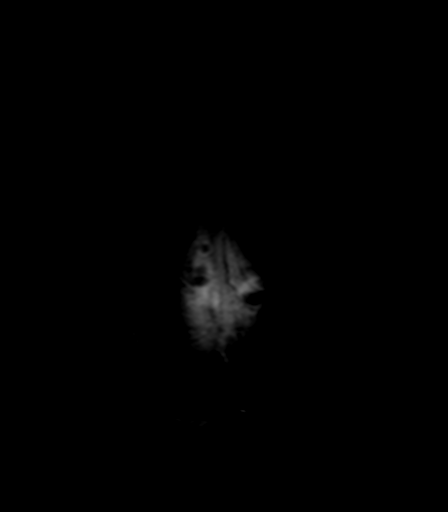

[Series 14: T2 · axial · 5.0mm · 0.45mm/px · z∈[-50,+94]mm · 3 of 25 slices shown (1 of 2)]
[im 1/25]
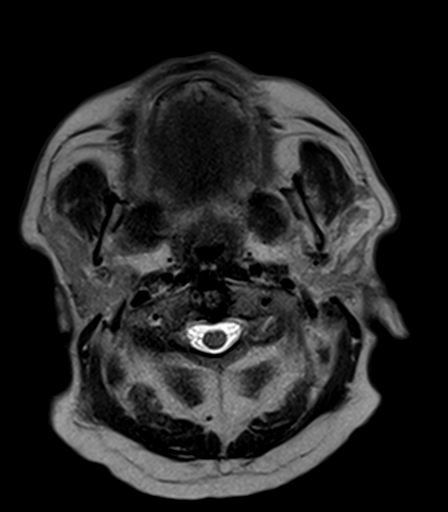
[im 13/25]
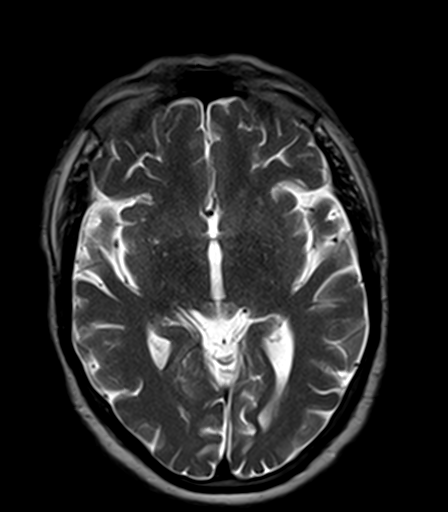
[im 25/25]
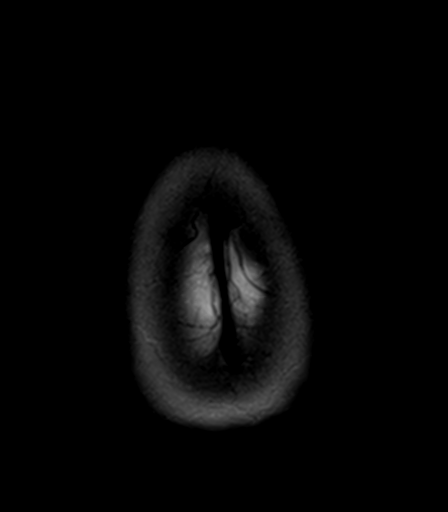

[Series 15: T1 · axial · 5.0mm · 0.90mm/px · z∈[-50,+94]mm · 3 of 25 slices shown (2 of 2)]
[im 1/25]
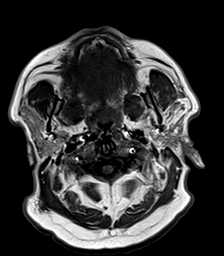
[im 13/25]
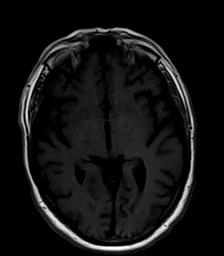
[im 25/25]
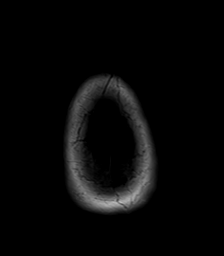

[Series 16: T2 · coronal · 5.0mm · 0.45mm/px · 3 of 31 slices shown (2 of 2)]
[im 1/31]
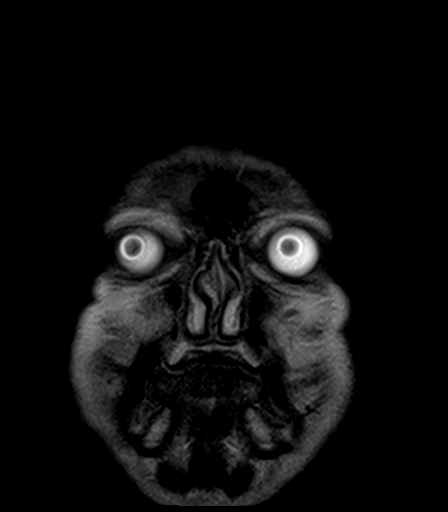
[im 16/31]
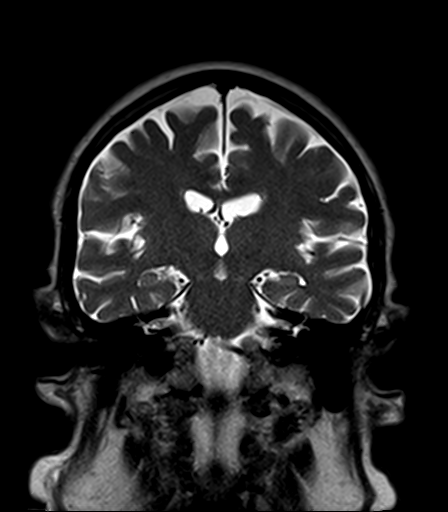
[im 31/31]
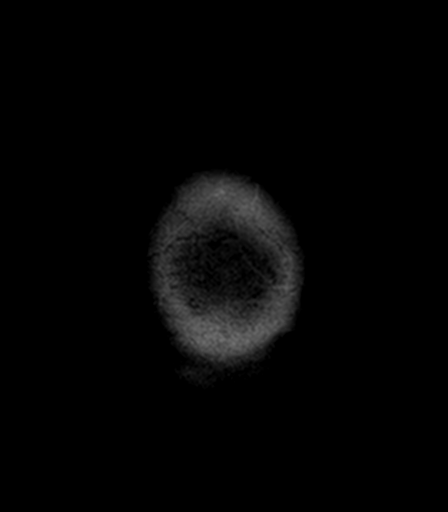

[34 of 48 positions shown; findings below may reference images not displayed]

FINDINGS: MRI HEAD FINDINGS

Brain: Mild generalized age-related cerebral atrophy. Patchy and
confluent T2/FLAIR hyperintensities seen involving the
periventricular, deep, and subcortical white matter both cerebral
hemispheres, nonspecific, but most like related chronic small vessel
ischemic change. These are mildly progressed relative to 7419.

No abnormal foci of restricted diffusion to suggest acute or
subacute ischemia. No encephalomalacia to suggest remote cortical
infarction. No foci of susceptibility artifact to suggest acute or
chronic intracranial hemorrhage.

Incidental note again made of a small midline lipoma, unchanged. No
other mass lesion. No midline shift or mass effect. No general
swelling or edema to suggest encephalitis. Ventricles normal size
without hydrocephalus. No extra-axial fluid collection. Pituitary
gland normal.

Vascular: Major intracranial vascular flow voids are maintained.

Skull and upper cervical spine: Craniocervical junction within
normal limits. Upper cervical spine normal. Bone marrow signal
intensity within normal limits. No scalp soft tissue abnormality.

Sinuses/Orbits: Globes and orbital soft tissues within normal
limits. Mild mucosal thickening within the ethmoidal air cells and
maxillary sinuses. Paranasal sinuses are otherwise clear. No mastoid
effusion. Inner ear structures grossly normal.

Other: None.

MRA HEAD FINDINGS

ANTERIOR CIRCULATION:

Distal cervical segments of the internal carotid arteries are patent
with antegrade flow. Petrous, cavernous, and supraclinoid segments
widely patent without flow-limiting stenosis. Origin of the
ophthalmic arteries patent bilaterally. ICA termini widely patent.
A1 segments widely patent bilaterally. Azygos ACA proximally, which
subsequently divides into bilateral A2 segments, both of which are
widely patent. M1 segments widely patent bilaterally. Normal MCA
bifurcations. No proximal M2 occlusion. Distal MCA branches well
perfused and symmetric.

POSTERIOR CIRCULATION:

Left vertebral artery dominant and widely patent to the
vertebrobasilar junction. Short fenestration of the right V4 segment
noted. Left PICA patent. Right PICA not visualized. Dominant right
AICA. Basilar widely patent to its distal aspect. Superior cerebral
arteries patent bilaterally. Right PCA supplied via the basilar.
Fetal type origin of the left PCA. PCAs widely patent to their
distal aspects without stenosis.

No intracranial aneurysm.
IMPRESSION: MRI HEAD IMPRESSION:

1. No acute intracranial infarct or other abnormality identified.
2. Mild cerebral atrophy with chronic nonspecific cerebral white
matter signal changes, mildly progressed relative to 7419. Findings
most commonly related to sequelae of chronic small vessel ischemic
disease.
3. Incidental small congenital midline lipoma, unchanged, and felt
to be of no clinical significance.

MRA HEAD IMPRESSION:

1. Negative intracranial MRA with no large vessel occlusion or
hemodynamically significant stenosis identified.
2. Incidental congenital variations including azygos ACA, fetal type
left PCA, and short fenestration within the right V4 segment.

## 2019-10-10 ENCOUNTER — Other Ambulatory Visit
Admission: RE | Admit: 2019-10-10 | Discharge: 2019-10-10 | Disposition: A | Discharge status: Home / Self Care | Payer: Medicare Other | Source: Ambulatory Visit | Attending: Neurosurgery | Admitting: Neurosurgery

## 2019-10-10 NOTE — Progress Notes (Signed)
Call to patient due to not showing up for covid and other lab tests today; no answer (left voice mail)

## 2019-10-12 ENCOUNTER — Ambulatory Visit: Admission: RE | Admit: 2019-10-12 | Payer: Medicare Other | Source: Home / Self Care | Admitting: Neurosurgery

## 2019-10-12 ENCOUNTER — Encounter: Admission: RE | Payer: Self-pay | Source: Home / Self Care

## 2019-10-12 SURGERY — LUMBAR LAMINECTOMY/DECOMPRESSION MICRODISCECTOMY 2 LEVELS
Anesthesia: General

## 2019-10-17 ENCOUNTER — Telehealth: Payer: Self-pay

## 2019-10-17 NOTE — Telephone Encounter (Signed)
Advised appt is not until 01/19/2020 930 am

## 2019-11-09 ENCOUNTER — Telehealth: Payer: Self-pay | Admitting: Infectious Diseases

## 2019-11-09 NOTE — Telephone Encounter (Signed)
Pt left a message regarding change in his cell phone- number - new is [phone].

## 2020-01-19 ENCOUNTER — Ambulatory Visit: Payer: Medicare Other | Attending: Infectious Diseases | Admitting: Infectious Diseases

## 2020-01-19 ENCOUNTER — Other Ambulatory Visit: Payer: Self-pay

## 2020-01-19 ENCOUNTER — Encounter: Payer: Self-pay | Admitting: Infectious Diseases

## 2020-01-19 VITALS — BP 129/79 | HR 68 | Temp 98.0°F | Resp 16 | Ht 67.0 in | Wt 210.0 lb

## 2020-01-19 DIAGNOSIS — I130 Hypertensive heart and chronic kidney disease with heart failure and stage 1 through stage 4 chronic kidney disease, or unspecified chronic kidney disease: Secondary | ICD-10-CM | POA: Diagnosis not present

## 2020-01-19 DIAGNOSIS — Z833 Family history of diabetes mellitus: Secondary | ICD-10-CM | POA: Diagnosis not present

## 2020-01-19 DIAGNOSIS — E119 Type 2 diabetes mellitus without complications: Secondary | ICD-10-CM

## 2020-01-19 DIAGNOSIS — I10 Essential (primary) hypertension: Secondary | ICD-10-CM

## 2020-01-19 DIAGNOSIS — E785 Hyperlipidemia, unspecified: Secondary | ICD-10-CM | POA: Diagnosis not present

## 2020-01-19 DIAGNOSIS — Z8249 Family history of ischemic heart disease and other diseases of the circulatory system: Secondary | ICD-10-CM | POA: Insufficient documentation

## 2020-01-19 DIAGNOSIS — E1122 Type 2 diabetes mellitus with diabetic chronic kidney disease: Secondary | ICD-10-CM | POA: Diagnosis not present

## 2020-01-19 DIAGNOSIS — G43909 Migraine, unspecified, not intractable, without status migrainosus: Secondary | ICD-10-CM | POA: Insufficient documentation

## 2020-01-19 DIAGNOSIS — I509 Heart failure, unspecified: Secondary | ICD-10-CM | POA: Insufficient documentation

## 2020-01-19 DIAGNOSIS — Z794 Long term (current) use of insulin: Secondary | ICD-10-CM | POA: Diagnosis not present

## 2020-01-19 DIAGNOSIS — B20 Human immunodeficiency virus [HIV] disease: Secondary | ICD-10-CM

## 2020-01-19 DIAGNOSIS — Z21 Asymptomatic human immunodeficiency virus [HIV] infection status: Secondary | ICD-10-CM | POA: Insufficient documentation

## 2020-01-19 DIAGNOSIS — N189 Chronic kidney disease, unspecified: Secondary | ICD-10-CM | POA: Diagnosis not present

## 2020-01-19 DIAGNOSIS — D649 Anemia, unspecified: Secondary | ICD-10-CM | POA: Diagnosis not present

## 2020-01-19 DIAGNOSIS — F200 Paranoid schizophrenia: Secondary | ICD-10-CM | POA: Insufficient documentation

## 2020-01-19 DIAGNOSIS — Z79899 Other long term (current) drug therapy: Secondary | ICD-10-CM | POA: Diagnosis not present

## 2020-01-19 DIAGNOSIS — E781 Pure hyperglyceridemia: Secondary | ICD-10-CM

## 2020-01-19 NOTE — Progress Notes (Signed)
NAME: Danny Cook.  DOB: [birth year]  MRN: [id number]  Date/Time: 01/19/2020 9:51 AM   Subjective:  Follow up visit-    ?Danny Cook. is a 59 y.o. male with a history of HIV/DM/CHF/schizophrenia, anxiety Currently on Triumeq and is 100% adherent.  Pt is having his migraine headache today- occipital headache and top of his head. No blurred vision, no nausea or vomiting, no diplopia, sees some flickering eyes Going on for 3 days He says he ran out of the migraine meds HE needs prescription from his neurologist for bultabital    Last viral load is undetectable and CD4 is  1038 from 12/30/2018.   He has no side effects from his medication. After his last visit in July 2020 with me  he has seen neurologist and started on Aricept Has back /leg pain- seen neurosurgeon- planning for decompressive minimally invasive surgery   HIV diagnosed 44s Nadir Cd4 <50 HAARt history Triumeq since 2016 Prior regimen- epzicom+ reyataz+norvir  Acquired thrusex with men Genotype-NK  Medical History CKD FSGS Hyperlipidemia Anemia- small M spike-  Pericardial effusion- resolved Left pleural effusion HTN Adrenal adenoma High Esr- much improved from > 140 to 35  Neg Autoimmune work up t-   Inactive problem Kaposi sarcoma ? ?   Past Surgical History:  Procedure Laterality Date  . CARDIAC CATHETERIZATION    . COLONOSCOPY WITH PROPOFOL N/A 09/03/2015   Procedure: COLONOSCOPY WITH PROPOFOL;  Surgeon: Lollie Sails, MD;  Location: [facility] ENDOSCOPY;  Service: Endoscopy;  Laterality: N/A;  . DG TEETH FULL    . ESOPHAGOGASTRODUODENOSCOPY (EGD) WITH PROPOFOL N/A 07/01/2016   Procedure: ESOPHAGOGASTRODUODENOSCOPY (EGD) WITH PROPOFOL;  Surgeon: Lollie Sails, MD;  Location: [facility] ENDOSCOPY;  Service: Endoscopy;  Laterality: N/A;    Social History   Socioeconomic History  . Marital status: Single    Spouse name: Not on file  . Number of children: Not on file  .  Years of education: 35  . Highest education level: GED or equivalent  Occupational History  . Not on file  Tobacco Use  . Smoking status: Never Smoker  . Smokeless tobacco: Never Used  Vaping Use  . Vaping Use: Never used  Substance and Sexual Activity  . Alcohol use: No  . Drug use: No  . Sexual activity: Never  Other Topics Concern  . Not on file  Social History Narrative  . Not on file   Social Determinants of Health   Financial Resource Strain:   . Difficulty of Paying Living Expenses:   Food Insecurity:   . Worried About Charity fundraiser in the Last Year:   . Arboriculturist in the Last Year:   Transportation Needs:   . Film/video editor (Medical):   [name] Lack of Transportation (Non-Medical):   Physical Activity:   . Days of Exercise per Week:   . Minutes of Exercise per Session:   Stress:   . Feeling of Stress :   Social Connections:   . Frequency of Communication with Friends and Family:   . Frequency of Social Gatherings with Friends and Family:   . Attends Religious Services:   . Active Member of Clubs or Organizations:   . Attends Archivist Meetings:   [name] Marital Status:   Intimate Partner Violence:   . Fear of Current or Ex-Partner:   . Emotionally Abused:   [name] Physically Abused:   . Sexually Abused:     Family History  Problem  Relation Age of Onset  . Heart attack Mother   . Diabetes Mellitus II Mother   . Mental illness Mother   . CAD Mother   . Heart attack Father   . CAD Father   . Hypertension Father    Allergies  Allergen Reactions  . Trazodone And Nefazodone     Affects other meds that they dont work  . Trazodone Hcl Other (See Comments)  . Viread [Tenofovir Disoproxil]     Damages kidneys  . Etodolac Rash   ? Current Outpatient Medications  Medication Sig Dispense Refill  . abacavir-dolutegravir-lamiVUDine (TRIUMEQ) 600-50-300 MG tablet Take 1 tablet by mouth at bedtime. 30 tablet 6  . acetaminophen (TYLENOL) 325 MG  tablet Take 2 tablets (650 mg total) by mouth every 6 (six) hours as needed for fever (Fever >101). 10 tablet 0  . albuterol (PROVENTIL HFA;VENTOLIN HFA) 108 (90 Base) MCG/ACT inhaler Inhale 2 puffs into the lungs every 6 (six) hours as needed for wheezing or shortness of breath. 1 Inhaler 2  . aspirin EC 325 MG tablet Take 325 mg by mouth daily.    . butalbital-aspirin-caffeine (FIORINAL) 50-325-40 MG capsule Take 1-2 capsules by mouth every 6 (six) hours as needed for headache.     . cyanocobalamin 1000 MCG tablet Take 1,000 mcg by mouth daily.     . diazepam (VALIUM) 5 MG tablet Take 5 mg by mouth 2 (two) times daily as needed for anxiety.     . diphenhydrAMINE (BENADRYL) 25 mg capsule Take 25 mg by mouth at bedtime.    . divalproex (DEPAKOTE ER) 500 MG 24 hr tablet Take 500 mg by mouth at bedtime.     . donepezil (ARICEPT) 5 MG tablet Take 10 mg by mouth daily.     [name] FANAPT 12 MG TABS Take 12 mg by mouth 2 (two) times daily.     . feeding supplement, GLUCERNA SHAKE, (GLUCERNA SHAKE) LIQD Take 237 mLs by mouth daily.    . ferrous sulfate 325 (65 FE) MG tablet Take 325 mg by mouth daily with breakfast.    . fluticasone (FLONASE) 50 MCG/ACT nasal spray Place 2 sprays into both nostrils 2 (two) times daily as needed for allergies.     . fluticasone (FLOVENT HFA) 220 MCG/ACT inhaler Inhale 2 puffs into the lungs 2 (two) times daily. Rinse out mouth afterwards 1 Inhaler 12  . gabapentin (NEURONTIN) 300 MG capsule Take 300 mg by mouth 3 (three) times daily.    [name] gemfibrozil (LOPID) 600 MG tablet Take 600 mg by mouth 2 (two) times daily before a meal.    . glimepiride (AMARYL) 4 MG tablet Take 4 mg by mouth daily with breakfast.     . haloperidol (HALDOL) 5 MG tablet Take 5 mg by mouth 2 (two) times daily.     . Insulin Degludec (TRESIBA FLEXTOUCH) 200 UNIT/ML SOPN Inject 10 Units into the skin daily. (Patient taking differently: Inject 50 Units into the skin daily. )    . losartan (COZAAR) 25 MG  tablet Take 1 tablet (25 mg total) by mouth daily. 30 tablet 0  . memantine (NAMENDA) 10 MG tablet Take 10 mg by mouth 2 (two) times daily.    . metFORMIN (GLUCOPHAGE) 1000 MG tablet Take 1,000 mg by mouth 2 (two) times daily with a meal.    . metoprolol tartrate (LOPRESSOR) 25 MG tablet Take 0.5 tablets (12.5 mg total) by mouth 2 (two) times daily. 30 tablet 0  . mirtazapine (REMERON)  30 MG tablet Take 30 mg by mouth at bedtime.    . mometasone (ELOCON) 0.1 % ointment Apply topically daily. Apply 4 drops to ear canal at bedtime as needed for itching and dry skin.    [name] NOVOLOG FLEXPEN 100 UNIT/ML FlexPen Inject 6 Units into the skin 3 (three) times daily as needed for high blood sugar (blood sugar over 100). Add sliding scale if sugar over 100; 200-250=2 units, 251-300=4 units, 301-350=6 units, 351-400= 8 units, over 400=10 units.    . Omega-3 Fatty Acids (FISH OIL) 1000 MG CAPS Take 1,000 mg by mouth daily.    [name] omeprazole (PRILOSEC) 40 MG capsule Take 40 mg by mouth 2 (two) times daily.     [name] OZEMPIC, 0.25 OR 0.5 MG/DOSE, 2 MG/1.5ML SOPN Inject 0.5 mg into the skin once a week.    . rosuvastatin (CRESTOR) 40 MG tablet Take 40 mg by mouth daily.     [name] senna (SENOKOT) 8.6 MG TABS tablet Take 1 tablet by mouth.    . sucralfate (CARAFATE) 1 g tablet Take 1 g by mouth 4 (four) times daily.     . tamsulosin (FLOMAX) 0.4 MG CAPS capsule Take 0.4 mg by mouth daily.    [name] triamcinolone cream (KENALOG) 0.1 % Apply 1 application topically 2 (two) times daily as needed. (Patient taking differently: Apply 1 application topically 2 (two) times daily. ) 30 g 0  . venlafaxine XR (EFFEXOR-XR) 75 MG 24 hr capsule Take 1 capsule (75 mg total) by mouth daily with breakfast. 30 capsule 0  . nitroGLYCERIN (NITROSTAT) 0.4 MG SL tablet Place 0.4 mg under the tongue every 5 (five) minutes as needed for chest pain.      No current facility-administered medications for this visit.    REVIEW OF SYSTEMS:  Const: negative  fever, negative chills, negative weight loss Eyes: negative diplopia or visual changes, negative eye pain ENT: negative coryza, negative sore throat Resp: negative cough, hemoptysis, dyspnea Cards: negative for chest pain, palpitations, lower extremity edema GU: negative for frequency, dysuria and hematuria Skin: negative for rash and pruritus Heme: negative for easy bruising and gum/nose bleeding MS: , back pain and leg pain. muscle weakness Neurolo: headaches memory problems  Psych: schizhoprenia Objective:  VITALS:  BP 129/79   Pulse 68   Temp 98 F (36.7 C)   Resp 16   Ht 5' 7" (1.702 m)   Wt 210 lb (95.3 kg)   SpO2 95%   BMI 32.89 kg/m PHYSICAL EXAM:  General:  in some distress due to headache, obese Head: Normocephalic, without obvious abnormality, atraumatic. Eyes: Conjunctivae clear, anicteric sclerae. Pupils are equal No nystagmus Nose: Nares normal. No drainage or sinus tenderness. edentulous Neck: Supple, symmetrical, no adenopathy, thyroid: non tender no carotid bruit and no JVD. Lungs: Clear to auscultation bilaterally. No Wheezing or Rhonchi. No rales. Heart: Regular rate and rhythm, no murmur, rub or gallop. Abdomen: Soft,  distended. Bowel sounds normal. No masses Extremities: Extremities normal, atraumatic, no cyanosis. No edema. No clubbing Lymph: Cervical, supraclavicular normal. Neurologic: Grossly non-focal Health maintenance Vaccination  Vaccine Date last given comment  Influenza 03/15/18   Hepatitis B    Hepatitis A    Prevnar-PCV-13 08/10/18   Pneumovac-PPSV-23 08/01/17   TdaP 08/10/18   HPV    Shingrix ( zoster vaccine)     ______________________  Labs Lab Result  Date comment  HIV VL <20 12/30/18   CD4 1030 ( 36) 12/30/18   Genotype  [id number]     HIV antibody     RPR NR 04/30/2018   Quantiferon Gold NEG 04/28/18   Hep C ab NR 04/03/2016   Hepatitis B-ab,ag,c Core ab positive 04/16/15   Hepatitis A-IgM, IgG /T     Lipid 177/540/30/     GC/CHL     PAP     HB,PLT,Cr, LFT 13.1/166/1.2/N 12/06/18     Preventive  Procedure Result  Date comment  colonoscopy  March 2021        Dental exam edentulous    Opthal       Impression/Recommendation  Headache- occipital- says it is his migraine BP N, no diplopia, no neurological issue Called his neurologist Jalene Mullet and she is sending Fioricet prescription to pharmacy. If headache persisit tomorrow she wanted him to call their clinic Also called the pharmacist at [facility] and they will deliver the meds to him ? ?AIDS : well controlled- last Cd4 from Jan 2021 is 850 and Vl 40. On triumeq-100% adherent. ?labs were not done today- will do next visit ?  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  Bone marrow(04/30/18) showed hypercellular bone marrow with 4% plasma cells negative for Congo red stain. Serum electrophoresis had a small M spike- unclear significance  DM-on liraglutide ( victoza) and Tresiba, metformin and glimepiride- saw his endocrinologist today  Hyperlipidemia on Rosuvastatin  Hypertriglyceridemia-  on Lopid  HTN -losartan, metoprolol  paranoid schizophrenia  He has received the first dose of the corona vaccine ? Follow-up 6 months ?labs were not done today- will do next visit ? Discussed with patient in detail

## 2020-03-04 NOTE — Progress Notes (Signed)
Patient ID: Danny Fairy., male    DOB: [birth year], 59 y.o.   MRN: [id number]  HPI  Danny Cook is a 59 y/o male with a history of DM, hyperlipidemia, HTN, CKD, HIV, schizophrenia, obstructive sleep apnea, anxiety, osteoarthritis and chronic heart failure.   Echo report from 06/08/18 reviewed and showed an EF of 55-60%. Echo report from 03/25/18 reviewed and showed an EF of 55-60%. Echo report from 07/31/17 reviewed and showed an EF of 55-65%. Echo report from 05/11/17 reviewed and showed an EF of 55% along with mild AR/ TR/ Danny.  Has not been admitted or been in the ED in the last 6 months.     He presents today for a follow-up visit with a chief complaint of minimal fatigue upon moderate exertion. He describes this as chronic in nature having been present for several years. He has associated light-headedness, anxiety and chronic back pain along with this. He denies any difficulty sleeping, abdominal distention, palpitations, pedal edema, chest pain, shortness of breath, cough or weight gain.   Past Medical History:  Diagnosis Date  . Anemia   . Anxiety   . Arthritis   . CHF (congestive heart failure) ([place])   . Chronic kidney disease    Renal Insufficiency Syndrome; Glomerulosclerosis 2013  . Depressed   . Diabetes mellitus without complication ([place])   . GERD (gastroesophageal reflux disease)   . High cholesterol   . HIV (human immunodeficiency virus infection) ([place])   . Hypertension   . 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[place])   . Paranoid disorder ([place])   . Pneumonia   . Schizophrenia, paranoid ([place])   . Sleep apnea   . TIA (transient ischemic attack)    Past Surgical History:  Procedure Laterality Date  . CARDIAC CATHETERIZATION    . COLONOSCOPY WITH PROPOFOL N/A 09/03/2015   Procedure: COLONOSCOPY WITH PROPOFOL;  Surgeon: Lollie Sails, MD;  Location[facility] ENDOSCOPY;  Service: Endoscopy;  Laterality: N/A;  . DG TEETH FULL    . ESOPHAGOGASTRODUODENOSCOPY (EGD) WITH PROPOFOL N/A  07/01/2016   Procedure: ESOPHAGOGASTRODUODENOSCOPY (EGD) WITH PROPOFOL;  Surgeon: Lollie Sails, MD;  Location: [facility] ENDOSCOPY;  Service: Endoscopy;  Laterality: N/A;   Family History  Problem Relation Age of Onset  . Heart attack Mother   . Diabetes Mellitus II Mother   . Mental illness Mother   . CAD Mother   . Heart attack Father   . CAD Father   . Hypertension Father    Social History   Tobacco Use  . Smoking status: Never Smoker  . Smokeless tobacco: Never Used  Substance Use Topics  . Alcohol use: No   Allergies  Allergen Reactions  . Trazodone And Nefazodone     Affects other meds that they dont work  . Trazodone Hcl Other (See Comments)  . Viread [Tenofovir Disoproxil]     Damages kidneys  . Etodolac Rash   Prior to Admission medications   Medication Sig Start Date End Date Taking? Authorizing Provider  abacavir-dolutegravir-lamiVUDine (TRIUMEQ) 600-50-300 MG tablet Take 1 tablet by mouth at bedtime. 07/21/19  Yes Tsosie Billing, MD  acetaminophen (TYLENOL) 325 MG tablet Take 2 tablets (650 mg total) by mouth every 6 (six) hours as needed for fever (Fever >101). 05/03/18  Yes Vaughan Basta, MD  albuterol (PROVENTIL HFA;VENTOLIN HFA) 108 (90 Base) MCG/ACT inhaler Inhale 2 puffs into the lungs every 6 (six) hours as needed for wheezing or shortness of breath. 04/14/18  Yes Loletha Grayer, MD  aspirin  EC 325 MG tablet Take 325 mg by mouth daily.   Yes [provider]  butalbital-aspirin-caffeine Acquanetta Chain) 50-325-40 MG capsule Take 1-2 capsules by mouth every 6 (six) hours as needed for headache.    Yes [provider]  cyanocobalamin 1000 MCG tablet Take 1,000 mcg by mouth daily.    Yes [provider]  diazepam (VALIUM) 5 MG tablet Take 5 mg by mouth 2 (two) times daily as needed for anxiety.    Yes [provider]  diphenhydrAMINE (BENADRYL) 25 mg capsule Take 25 mg by mouth at bedtime.   Yes [provider]  divalproex (DEPAKOTE ER) 500 MG 24 hr tablet Take 500 mg by mouth at bedtime.    Yes [provider]  donepezil (ARICEPT) 5 MG tablet Take 10 mg by mouth daily.  07/19/19  Yes [provider]  FANAPT 12 MG TABS Take 12 mg by mouth 2 (two) times daily.  11/14/16  Yes [provider]  feeding supplement, GLUCERNA SHAKE, (GLUCERNA SHAKE) LIQD Take 237 mLs by mouth daily.   Yes [provider]  ferrous sulfate 325 (65 FE) MG tablet Take 325 mg by mouth daily with breakfast.   Yes [provider]  fluticasone (FLOVENT HFA) 220 MCG/ACT inhaler Inhale 2 puffs into the lungs 2 (two) times daily. Rinse out mouth afterwards 04/14/18  Yes Wieting, Richard, MD  gabapentin (NEURONTIN) 400 MG capsule Take 400 mg by mouth 3 (three) times daily.    Yes [provider]  gemfibrozil (LOPID) 600 MG tablet Take 600 mg by mouth 2 (two) times daily before a meal.   Yes [provider]  glimepiride (AMARYL) 4 MG tablet Take 4 mg by mouth daily with breakfast.  06/06/19  Yes [provider]  glycopyrrolate (ROBINUL) 1 MG tablet Take 1 mg by mouth 3 (three) times daily.   Yes [provider]  Insulin Degludec (TRESIBA FLEXTOUCH) 200 UNIT/ML SOPN Inject 10 Units into the skin daily. Patient taking differently: Inject 50 Units into the skin daily.  08/19/19  Yes Nolberto Hanlon, MD  losartan (COZAAR) 25 MG tablet Take 1 tablet (25 mg total) by mouth daily. 05/04/18  Yes Vaughan Basta, MD  memantine (NAMENDA) 10 MG tablet Take 10 mg by mouth 2 (two) times daily.   Yes [provider]  metFORMIN (GLUCOPHAGE) 1000 MG tablet Take 1,000 mg by mouth 2 (two) times daily with a meal.   Yes [provider]  metoprolol tartrate (LOPRESSOR) 25 MG tablet Take 0.5 tablets (12.5 mg total) by mouth 2 (two) times daily. 05/03/18  Yes Vaughan Basta, MD  mirtazapine (REMERON) 30 MG tablet Take 30 mg by mouth at bedtime. 04/21/18  Yes  [provider]  mometasone (ELOCON) 0.1 % ointment Apply topically daily. Apply 4 drops to ear canal at bedtime as needed for itching and dry skin.   Yes [provider]  nitroGLYCERIN (NITROSTAT) 0.4 MG SL tablet Place 0.4 mg under the tongue every 5 (five) minutes as needed for chest pain.    Yes [provider]  Omega-3 Fatty Acids (FISH OIL) 1000 MG CAPS Take 1,000 mg by mouth daily.   Yes [provider]  omeprazole (PRILOSEC) 40 MG capsule Take 40 mg by mouth 2 (two) times daily.    Yes [provider]  OZEMPIC, 0.25 OR 0.5 MG/DOSE, 2 MG/1.5ML SOPN Inject 1 mg into the skin once a week.  06/06/19  Yes [provider]  rosuvastatin (CRESTOR) 40  MG tablet Take 40 mg by mouth daily.    Yes [provider]  senna (SENOKOT) 8.6 MG TABS tablet Take 1 tablet by mouth daily.    Yes [provider]  sucralfate (CARAFATE) 1 g tablet Take 1 g by mouth 4 (four) times daily.  04/21/18  Yes [provider]  tamsulosin (FLOMAX) 0.4 MG CAPS capsule Take 0.4 mg by mouth daily. 08/02/19  Yes [provider]  triamcinolone cream (KENALOG) 0.1 % Apply 1 application topically 2 (two) times daily as needed. Patient taking differently: Apply 1 application topically 2 (two) times daily.  11/16/16  Yes Little, Traci M, PA-C  venlafaxine XR (EFFEXOR-XR) 75 MG 24 hr capsule Take 1 capsule (75 mg total) by mouth daily with breakfast. 11/09/17  Yes McNew, Tyson Babinski, MD     Review of Systems  Constitutional: Positive for fatigue. Negative for appetite change.  HENT: Positive for rhinorrhea. Negative for congestion, postnasal drip and sore throat.   Eyes: Negative.   Respiratory: Negative for cough, chest tightness and shortness of breath.   Cardiovascular: Negative for chest pain, palpitations and leg swelling.  Gastrointestinal: Negative for abdominal distention and abdominal pain.  Endocrine: Negative.   Genitourinary: Negative.    Musculoskeletal: Positive for back pain (when standing for long periods of time). Negative for neck pain.  Skin: Negative.   Allergic/Immunologic: Negative.   Neurological: Positive for light-headedness (on occasion). Negative for dizziness.  Hematological: Negative for adenopathy. Does not bruise/bleed easily.  Psychiatric/Behavioral: Negative for dysphoric mood and sleep disturbance. The patient is nervous/anxious.    Vitals:   03/06/20 0927  BP: 132/77  Pulse: 67  Resp: 18  SpO2: 97%  Weight: 211 lb 6 oz (95.9 kg)  Height: 5\' 7"  (1.702 m)   Wt Readings from Last 3 Encounters:  03/06/20 211 lb 6 oz (95.9 kg)  01/19/20 210 lb (95.3 kg)  09/30/19 208 lb (94.3 kg)   Lab Results  Component Value Date   CREATININE 1.02 08/19/2019   CREATININE 1.22 08/18/2019   CREATININE 1.10 08/17/2019    Physical Exam Vitals and nursing note reviewed.  Constitutional:      Appearance: He is well-developed.  HENT:     Head: Normocephalic and atraumatic.  Neck:     Vascular: No JVD.  Cardiovascular:     Rate and Rhythm: Normal rate and regular rhythm.  Pulmonary:     Effort: Pulmonary effort is normal.     Breath sounds: No wheezing or rales.  Abdominal:     General: There is no distension.     Palpations: Abdomen is soft.     Tenderness: There is no abdominal tenderness.  Musculoskeletal:        General: No tenderness.     Cervical back: Normal range of motion and neck supple.  Skin:    General: Skin is warm and dry.  Neurological:     Mental Status: He is alert and oriented to person, place, and time.  Psychiatric:        Behavior: Behavior normal.        Thought Content: Thought content normal.    Assessment & Plan:  1: Chronic heart failure with preserved ejection fraction- - NYHA class II - euvolemic today - weighing daily; reminded to call for an overnight weight gain of >2 pounds or a weekly weight gain of >5 pounds - weight up 3 pounds from last visit here 6  months ago - not adding salt to his food  and doesn't think the food at the family care home tastes salty - saw cardiology Minette Brine) 01/26/20 - BNP 03/29/18 was 235.0 - patient says that he's received both his COVID vaccines  2: HTN- - BP looks good today - BMP from 12/12/19 reviewed and showed sodium 145, potassium 4.1, creatinine 1.1 and GFR 68 - saw PCP Ola Spurr) 12/23/19  3: Diabetes- - A1c 12/12/19 was 5.5% - saw endocrinologist Jenetta DownerDina Rich) 01/12/20 - saw nephrologist ([place]) 12/14/19 - glucose at the facility this morning was [place] medication list was reviewed.  Return in 6 months or sooner for any questions/problems before then.

## 2020-03-06 ENCOUNTER — Other Ambulatory Visit: Payer: Self-pay

## 2020-03-06 ENCOUNTER — Encounter: Payer: Self-pay | Admitting: Family

## 2020-03-06 ENCOUNTER — Ambulatory Visit: Payer: Medicare Other | Attending: Family | Admitting: Family

## 2020-03-06 VITALS — BP 132/77 | HR 67 | Resp 18 | Ht 67.0 in | Wt 211.4 lb

## 2020-03-06 DIAGNOSIS — Z7982 Long term (current) use of aspirin: Secondary | ICD-10-CM | POA: Diagnosis not present

## 2020-03-06 DIAGNOSIS — G8929 Other chronic pain: Secondary | ICD-10-CM | POA: Insufficient documentation

## 2020-03-06 DIAGNOSIS — Z8673 Personal history of transient ischemic attack (TIA), and cerebral infarction without residual deficits: Secondary | ICD-10-CM | POA: Diagnosis not present

## 2020-03-06 DIAGNOSIS — G4733 Obstructive sleep apnea (adult) (pediatric): Secondary | ICD-10-CM | POA: Insufficient documentation

## 2020-03-06 DIAGNOSIS — Z888 Allergy status to other drugs, medicaments and biological substances status: Secondary | ICD-10-CM | POA: Diagnosis not present

## 2020-03-06 DIAGNOSIS — I130 Hypertensive heart and chronic kidney disease with heart failure and stage 1 through stage 4 chronic kidney disease, or unspecified chronic kidney disease: Secondary | ICD-10-CM | POA: Insufficient documentation

## 2020-03-06 DIAGNOSIS — F419 Anxiety disorder, unspecified: Secondary | ICD-10-CM | POA: Insufficient documentation

## 2020-03-06 DIAGNOSIS — F329 Major depressive disorder, single episode, unspecified: Secondary | ICD-10-CM | POA: Insufficient documentation

## 2020-03-06 DIAGNOSIS — Z7951 Long term (current) use of inhaled steroids: Secondary | ICD-10-CM | POA: Diagnosis not present

## 2020-03-06 DIAGNOSIS — I10 Essential (primary) hypertension: Secondary | ICD-10-CM

## 2020-03-06 DIAGNOSIS — E7800 Pure hypercholesterolemia, unspecified: Secondary | ICD-10-CM | POA: Diagnosis not present

## 2020-03-06 DIAGNOSIS — Z79899 Other long term (current) drug therapy: Secondary | ICD-10-CM | POA: Insufficient documentation

## 2020-03-06 DIAGNOSIS — I5032 Chronic diastolic (congestive) heart failure: Secondary | ICD-10-CM | POA: Diagnosis present

## 2020-03-06 DIAGNOSIS — Z21 Asymptomatic human immunodeficiency virus [HIV] infection status: Secondary | ICD-10-CM | POA: Diagnosis not present

## 2020-03-06 DIAGNOSIS — Z8249 Family history of ischemic heart disease and other diseases of the circulatory system: Secondary | ICD-10-CM | POA: Insufficient documentation

## 2020-03-06 DIAGNOSIS — N189 Chronic kidney disease, unspecified: Secondary | ICD-10-CM | POA: Insufficient documentation

## 2020-03-06 DIAGNOSIS — M549 Dorsalgia, unspecified: Secondary | ICD-10-CM | POA: Insufficient documentation

## 2020-03-06 DIAGNOSIS — F200 Paranoid schizophrenia: Secondary | ICD-10-CM | POA: Diagnosis not present

## 2020-03-06 DIAGNOSIS — M1990 Unspecified osteoarthritis, unspecified site: Secondary | ICD-10-CM | POA: Diagnosis not present

## 2020-03-06 DIAGNOSIS — E1122 Type 2 diabetes mellitus with diabetic chronic kidney disease: Secondary | ICD-10-CM | POA: Insufficient documentation

## 2020-03-06 DIAGNOSIS — Z794 Long term (current) use of insulin: Secondary | ICD-10-CM | POA: Diagnosis not present

## 2020-03-06 DIAGNOSIS — K219 Gastro-esophageal reflux disease without esophagitis: Secondary | ICD-10-CM | POA: Insufficient documentation

## 2020-03-06 NOTE — Patient Instructions (Signed)
Continue weighing daily and call for an overnight weight gain of > 2 pounds or a weekly weight gain of >5 pounds. 

## 2020-03-07 ENCOUNTER — Ambulatory Visit: Payer: Medicare Other | Admitting: Family

## 2020-05-31 ENCOUNTER — Ambulatory Visit: Payer: Medicare Other | Admitting: Family

## 2020-07-10 NOTE — Progress Notes (Signed)
Patient ID: Danny Cook., male    DOB: 04/29/1961, 60 y.o.   MRN: [id number]  HPI  Danny Cook a 60 y/o male with a history of DM, hyperlipidemia, HTN, CKD, HIV, schizophrenia, obstructive sleep apnea, anxiety, osteoarthritis and chronic heart failure.   Echo report from 06/08/18 reviewed and showed an EF of 55-60%. Echo report from 03/25/18 reviewed and showed an EF of 55-60%. Echo report from 07/31/17 reviewed and showed an EF of 55-65%. Echo report from 05/11/17 reviewed and showed an EF of 55% along with mild AR/ TR/ Danny.  Has not been admitted or been in the ED in the last 6 months.     Danny Cook presents today for a follow-up visit with a chief complaint of minimal shortness of breath upon moderate exertion. Danny Cook describes this as chronic in nature having been present for several years. Danny Cook has associated fatigue, pedal edema, light-headedness, anxiety and slight weight gain along with this. Danny Cook denies any difficulty sleeping, abdominal distention, palpitations, chest pain or cough.   Has compression socks but hasn't been wearing them.   Past Medical History:  Diagnosis Date  . Anemia   . Anxiety   . Arthritis   . CHF (congestive heart failure) ([place])   . Chronic kidney disease    Renal Insufficiency Syndrome; Glomerulosclerosis 2013  . Depressed   . Diabetes mellitus without complication ([place])   . GERD (gastroesophageal reflux disease)   . High cholesterol   . HIV (human immunodeficiency virus infection) ([place])   . Hypertension   . 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 Paranoid disorder ([place])   . Pneumonia   . Schizophrenia, paranoid ([place])   . Sleep apnea   . TIA (transient ischemic attack)    Past Surgical History:  Procedure Laterality Date  . CARDIAC CATHETERIZATION    . COLONOSCOPY WITH PROPOFOL N/A 09/03/2015   Procedure: COLONOSCOPY WITH PROPOFOL;  Surgeon: Lollie Sails, MD;  Location: [facility] ENDOSCOPY;  Service: Endoscopy;  Laterality: N/A;  . DG TEETH FULL    .  ESOPHAGOGASTRODUODENOSCOPY (EGD) WITH PROPOFOL N/A 07/01/2016   Procedure: ESOPHAGOGASTRODUODENOSCOPY (EGD) WITH PROPOFOL;  Surgeon: Lollie Sails, MD;  Location: [facility] ENDOSCOPY;  Service: Endoscopy;  Laterality: N/A;   Family History  Problem Relation Age of Onset  . Heart attack Mother   . Diabetes Mellitus II Mother   . Mental illness Mother   . CAD Mother   . Heart attack Father   . CAD Father   . Hypertension Father    Social History   Tobacco Use  . Smoking status: Never Smoker  . Smokeless tobacco: Never Used  Substance Use Topics  . Alcohol use: No   Allergies  Allergen Reactions  . Trazodone And Nefazodone     Affects other meds that they dont work  . Trazodone Hcl Other (See Comments)  . Viread [Tenofovir Disoproxil]     Damages kidneys  . Etodolac Rash   Prior to Admission medications   Medication Sig Start Date End Date Taking? Authorizing Provider  abacavir-dolutegravir-lamiVUDine (TRIUMEQ) 600-50-300 MG tablet Take 1 tablet by mouth at bedtime. 07/21/19  Yes Tsosie Billing, MD  albuterol (PROVENTIL HFA;VENTOLIN HFA) 108 (90 Base) MCG/ACT inhaler Inhale 2 puffs into the lungs every 6 (six) hours as needed for wheezing or shortness of breath. 04/14/18  Yes Loletha Grayer, MD  aspirin EC 325 MG tablet Take 325 mg by mouth daily.   Yes [provider]  butalbital-aspirin-caffeine Acquanetta Chain) 50-325-40 MG capsule  Take 1-2 capsules by mouth every 6 (six) hours as needed for headache.    Yes [provider]  cyanocobalamin 1000 MCG tablet Take 1,000 mcg by mouth daily.    Yes [provider]  diazepam (VALIUM) 5 MG tablet Take 5 mg by mouth 2 (two) times daily as needed for anxiety.    Yes [provider]  diphenhydrAMINE (BENADRYL) 25 mg capsule Take 25 mg by mouth at bedtime.   Yes [provider]  divalproex (DEPAKOTE ER) 500 MG 24 hr tablet Take 500 mg by mouth at bedtime.    Yes [provider]   docusate sodium (COLACE) 250 MG capsule Take 250 mg by mouth daily.   Yes [provider]  donepezil (ARICEPT) 5 MG tablet Take 10 mg by mouth daily.  07/19/19  Yes [provider]  FANAPT 12 MG TABS Take 12 mg by mouth 2 (two) times daily.  11/14/16  Yes [provider]  fluticasone (FLOVENT HFA) 220 MCG/ACT inhaler Inhale 2 puffs into the lungs 2 (two) times daily. Rinse out mouth afterwards 04/14/18  Yes Wieting, Richard, MD  gabapentin (NEURONTIN) 400 MG capsule Take 400 mg by mouth 3 (three) times daily.    Yes [provider]  gemfibrozil (LOPID) 600 MG tablet Take 600 mg by mouth 2 (two) times daily before a meal.   Yes [provider]  glimepiride (AMARYL) 4 MG tablet Take 4 mg by mouth daily with breakfast.  06/06/19  Yes [provider]  glycopyrrolate (ROBINUL) 1 MG tablet Take 1 mg by mouth 2 (two) times daily.   Yes [provider]  Insulin Aspart (NOVOLOG FLEXPEN [place]) Inject into the skin. Sliding scale   Yes [provider]  Insulin Degludec (TRESIBA FLEXTOUCH) 200 UNIT/ML SOPN Inject 10 Units into the skin daily. Patient taking differently: Inject 50 Units into the skin daily. 08/19/19  Yes Nolberto Hanlon, MD  losartan (COZAAR) 25 MG tablet Take 1 tablet (25 mg total) by mouth daily. 05/04/18  Yes Vaughan Basta, MD  memantine (NAMENDA) 10 MG tablet Take 10 mg by mouth 2 (two) times daily.   Yes [provider]  metFORMIN (GLUCOPHAGE) 1000 MG tablet Take 1,000 mg by mouth 2 (two) times daily with a meal.   Yes [provider]  metoprolol tartrate (LOPRESSOR) 25 MG tablet Take 0.5 tablets (12.5 mg total) by mouth 2 (two) times daily. 05/03/18  Yes Vaughan Basta, MD  mirtazapine (REMERON) 30 MG tablet Take 30 mg by mouth at bedtime. 04/21/18  Yes [provider]  mometasone (ELOCON) 0.1 % ointment Apply topically daily. Apply 4 drops to ear canal at bedtime as needed for itching  and dry skin.   Yes [provider]  Multiple Vitamin (MULTIVITAMIN) tablet Take 1 tablet by mouth daily.   Yes [provider]  nitroGLYCERIN (NITROSTAT) 0.4 MG SL tablet Place 0.4 mg under the tongue every 5 (five) minutes as needed for chest pain.    Yes [provider]  Omega-3 Fatty Acids (FISH OIL) 1000 MG CAPS Take 1,000 mg by mouth daily.   Yes [provider]  omeprazole (PRILOSEC) 40 MG capsule Take 40 mg by mouth 2 (two) times daily.    Yes [provider]  OZEMPIC, 0.25 OR 0.5 MG/DOSE, 2 MG/1.5ML SOPN Inject 1 mg into the skin once a week.  06/06/19  Yes [provider]  rosuvastatin (CRESTOR) 40 MG tablet Take 40 mg by mouth daily.  Yes [provider]  senna (SENOKOT) 8.6 MG TABS tablet Take 1 tablet by mouth daily.    Yes [provider]  sucralfate (CARAFATE) 1 g tablet Take 1 g by mouth 4 (four) times daily.  04/21/18  Yes [provider]  tamsulosin (FLOMAX) 0.4 MG CAPS capsule Take 0.4 mg by mouth daily. 08/02/19  Yes [provider]  triamcinolone cream (KENALOG) 0.1 % Apply 1 application topically 2 (two) times daily as needed. Patient taking differently: Apply 1 application topically 2 (two) times daily. 11/16/16  Yes Little, Traci M, PA-C  venlafaxine XR (EFFEXOR-XR) 75 MG 24 hr capsule Take 1 capsule (75 mg total) by mouth daily with breakfast. 11/09/17  Yes McNew, Tyson Babinski, MD  ziprasidone (GEODON) 20 MG capsule Take 20 mg by mouth daily.   Yes [provider]  ferrous sulfate 325 (65 FE) MG tablet Take 325 mg by mouth daily with breakfast.    [provider]    Review of Systems  Constitutional: Positive for fatigue. Negative for appetite change.  HENT: Positive for rhinorrhea. Negative for congestion, postnasal drip and sore throat.   Eyes: Negative.   Respiratory: Positive for shortness of breath. Negative for cough and chest tightness.   Cardiovascular: Positive  for leg swelling. Negative for chest pain and palpitations.  Gastrointestinal: Negative for abdominal distention and abdominal pain.  Endocrine: Negative.   Genitourinary: Negative.   Musculoskeletal: Positive for back pain (when standing for long periods of time). Negative for neck pain.  Skin: Negative.   Allergic/Immunologic: Negative.   Neurological: Positive for light-headedness (on occasion). Negative for dizziness.  Hematological: Negative for adenopathy. Does not bruise/bleed easily.  Psychiatric/Behavioral: Negative for dysphoric mood and sleep disturbance. The patient Cook nervous/anxious.    Vitals:   07/11/20 0905  BP: 113/60  Pulse: 81  Resp: 20  SpO2: 97%  Weight: 214 lb (97.1 kg)  Height: 5\' 7"  (1.702 m)   Wt Readings from Last 3 Encounters:  07/11/20 214 lb (97.1 kg)  03/06/20 211 lb 6 oz (95.9 kg)  01/19/20 210 lb (95.3 kg)   Lab Results  Component Value Date   CREATININE 1.02 08/19/2019   CREATININE 1.22 08/18/2019   CREATININE 1.10 08/17/2019    Physical Exam Vitals and nursing note reviewed.  Constitutional:      Appearance: Danny Cook Cook well-developed.  HENT:     Head: Normocephalic and atraumatic.  Neck:     Vascular: No JVD.  Cardiovascular:     Rate and Rhythm: Normal rate and regular rhythm.  Pulmonary:     Effort: Pulmonary effort Cook normal.     Breath sounds: No wheezing or rales.  Abdominal:     General: There Cook no distension.     Palpations: Abdomen Cook soft.     Tenderness: There Cook no abdominal tenderness.  Musculoskeletal:        General: No tenderness.     Cervical back: Normal range of motion and neck supple.     Right lower leg: No tenderness. Edema (2+ pitting) present.     Left lower leg: No tenderness. Edema (2+ pitting) present.  Skin:    General: Skin Cook warm and dry.  Neurological:     General: No focal deficit present.     Mental Status: Danny Cook Cook alert and oriented to person, place, and time.  Psychiatric:        Mood and  Affect: Mood normal.        Behavior: Behavior  normal.        Thought Content: Thought content normal.    Assessment & Plan:  1: Chronic heart failure with preserved ejection fraction- - NYHA class II - euvolemic today - weighing daily; reminded to call for an overnight weight gain of >2 pounds or a weekly weight gain of >5 pounds - weight up 3 pounds from last visit here 4 months ago - not adding salt to his food and doesn't think the food at the family care home tastes salty - saw cardiology Minette Brine) 01/26/20 - updated echo scheduled for 07/25/20 - instructed to begin wearing his compression socks daily with removal at bedtime - BNP 03/29/18 was 235.0 - patient says that Danny Cook's received all 3 of his COVID vaccines - reports receiving his flu vaccine for this season  2: HTN- - BP looks good today - BMP from 03/20/20 reviewed and showed sodium 141, potassium 4.4, creatinine 1.2 and GFR 62 - saw PCP Ola Spurr) 12/23/19  3: Diabetes- - A1c 04/19/20 was 5.7% - saw endocrinologist Jenetta DownerDina Rich) 04/19/20 - saw nephrologist ([place]) 12/14/19 - glucose at the facility this morning was [place] medication list was reviewed.  Return after 3 weeks (to review echo) or sooner for any questions/problems before then.

## 2020-07-11 ENCOUNTER — Other Ambulatory Visit: Payer: Self-pay

## 2020-07-11 ENCOUNTER — Ambulatory Visit: Payer: Medicare Other | Attending: Family | Admitting: Family

## 2020-07-11 ENCOUNTER — Encounter: Payer: Self-pay | Admitting: Family

## 2020-07-11 VITALS — BP 113/60 | HR 81 | Resp 20 | Ht 67.0 in | Wt 214.0 lb

## 2020-07-11 DIAGNOSIS — I10 Essential (primary) hypertension: Secondary | ICD-10-CM

## 2020-07-11 DIAGNOSIS — Z79899 Other long term (current) drug therapy: Secondary | ICD-10-CM | POA: Diagnosis not present

## 2020-07-11 DIAGNOSIS — Z7982 Long term (current) use of aspirin: Secondary | ICD-10-CM | POA: Insufficient documentation

## 2020-07-11 DIAGNOSIS — Z7951 Long term (current) use of inhaled steroids: Secondary | ICD-10-CM | POA: Insufficient documentation

## 2020-07-11 DIAGNOSIS — Z833 Family history of diabetes mellitus: Secondary | ICD-10-CM | POA: Diagnosis not present

## 2020-07-11 DIAGNOSIS — R42 Dizziness and giddiness: Secondary | ICD-10-CM | POA: Insufficient documentation

## 2020-07-11 DIAGNOSIS — I5032 Chronic diastolic (congestive) heart failure: Secondary | ICD-10-CM | POA: Insufficient documentation

## 2020-07-11 DIAGNOSIS — N189 Chronic kidney disease, unspecified: Secondary | ICD-10-CM | POA: Diagnosis not present

## 2020-07-11 DIAGNOSIS — E1122 Type 2 diabetes mellitus with diabetic chronic kidney disease: Secondary | ICD-10-CM | POA: Diagnosis not present

## 2020-07-11 DIAGNOSIS — R5383 Other fatigue: Secondary | ICD-10-CM | POA: Insufficient documentation

## 2020-07-11 DIAGNOSIS — Z8249 Family history of ischemic heart disease and other diseases of the circulatory system: Secondary | ICD-10-CM | POA: Diagnosis not present

## 2020-07-11 DIAGNOSIS — R0602 Shortness of breath: Secondary | ICD-10-CM | POA: Insufficient documentation

## 2020-07-11 DIAGNOSIS — I130 Hypertensive heart and chronic kidney disease with heart failure and stage 1 through stage 4 chronic kidney disease, or unspecified chronic kidney disease: Secondary | ICD-10-CM | POA: Diagnosis not present

## 2020-07-11 DIAGNOSIS — Z8673 Personal history of transient ischemic attack (TIA), and cerebral infarction without residual deficits: Secondary | ICD-10-CM | POA: Insufficient documentation

## 2020-07-11 DIAGNOSIS — Z794 Long term (current) use of insulin: Secondary | ICD-10-CM | POA: Diagnosis not present

## 2020-07-11 DIAGNOSIS — N182 Chronic kidney disease, stage 2 (mild): Secondary | ICD-10-CM

## 2020-07-11 NOTE — Patient Instructions (Addendum)
Continue weighing daily and call for an overnight weight gain of > 2 pounds or a weekly weight gain of >5 pounds.    Begin wearing compression socks daily with removal at bedtime.  

## 2020-07-12 ENCOUNTER — Other Ambulatory Visit: Payer: Self-pay

## 2020-07-12 ENCOUNTER — Ambulatory Visit: Payer: Medicare Other | Attending: Infectious Diseases | Admitting: Infectious Diseases

## 2020-07-12 ENCOUNTER — Encounter: Payer: Self-pay | Admitting: Infectious Diseases

## 2020-07-12 ENCOUNTER — Other Ambulatory Visit
Admission: RE | Admit: 2020-07-12 | Discharge: 2020-07-12 | Disposition: A | Discharge status: Home / Self Care | Payer: Medicare Other | Source: Ambulatory Visit | Attending: Infectious Diseases | Admitting: Infectious Diseases

## 2020-07-12 VITALS — BP 118/75 | HR 82 | Temp 98.3°F | Resp 16 | Ht 67.0 in | Wt 214.0 lb

## 2020-07-12 DIAGNOSIS — Z794 Long term (current) use of insulin: Secondary | ICD-10-CM | POA: Insufficient documentation

## 2020-07-12 DIAGNOSIS — Z79899 Other long term (current) drug therapy: Secondary | ICD-10-CM | POA: Diagnosis not present

## 2020-07-12 DIAGNOSIS — B20 Human immunodeficiency virus [HIV] disease: Secondary | ICD-10-CM | POA: Diagnosis not present

## 2020-07-12 DIAGNOSIS — E1122 Type 2 diabetes mellitus with diabetic chronic kidney disease: Secondary | ICD-10-CM | POA: Insufficient documentation

## 2020-07-12 DIAGNOSIS — E785 Hyperlipidemia, unspecified: Secondary | ICD-10-CM | POA: Diagnosis not present

## 2020-07-12 DIAGNOSIS — Z7982 Long term (current) use of aspirin: Secondary | ICD-10-CM | POA: Insufficient documentation

## 2020-07-12 DIAGNOSIS — D649 Anemia, unspecified: Secondary | ICD-10-CM | POA: Insufficient documentation

## 2020-07-12 DIAGNOSIS — F200 Paranoid schizophrenia: Secondary | ICD-10-CM | POA: Insufficient documentation

## 2020-07-12 DIAGNOSIS — E781 Pure hyperglyceridemia: Secondary | ICD-10-CM | POA: Diagnosis not present

## 2020-07-12 DIAGNOSIS — N189 Chronic kidney disease, unspecified: Secondary | ICD-10-CM | POA: Insufficient documentation

## 2020-07-12 DIAGNOSIS — Z86018 Personal history of other benign neoplasm: Secondary | ICD-10-CM | POA: Diagnosis not present

## 2020-07-12 DIAGNOSIS — M48062 Spinal stenosis, lumbar region with neurogenic claudication: Secondary | ICD-10-CM | POA: Insufficient documentation

## 2020-07-12 DIAGNOSIS — I130 Hypertensive heart and chronic kidney disease with heart failure and stage 1 through stage 4 chronic kidney disease, or unspecified chronic kidney disease: Secondary | ICD-10-CM | POA: Insufficient documentation

## 2020-07-12 DIAGNOSIS — G43909 Migraine, unspecified, not intractable, without status migrainosus: Secondary | ICD-10-CM | POA: Diagnosis not present

## 2020-07-12 LAB — CBC WITH DIFFERENTIAL/PLATELET
Abs Immature Granulocytes: 0.07 10*3/uL (ref 0.00–0.07)
Basophils Absolute: 0.1 10*3/uL (ref 0.0–0.1)
Basophils Relative: 1 %
Eosinophils Absolute: 0 10*3/uL (ref 0.0–0.5)
Eosinophils Relative: 0 %
HCT: 37.6 % — ABNORMAL LOW (ref 39.0–52.0)
Hemoglobin: 13 g/dL (ref 13.0–17.0)
Immature Granulocytes: 1 %
Lymphocytes Relative: 31 %
Lymphs Abs: 2.1 10*3/uL (ref 0.7–4.0)
MCH: 32.3 pg (ref 26.0–34.0)
MCHC: 34.6 g/dL (ref 30.0–36.0)
MCV: 93.3 fL (ref 80.0–100.0)
Monocytes Absolute: 0.6 10*3/uL (ref 0.1–1.0)
Monocytes Relative: 9 %
Neutro Abs: 4 10*3/uL (ref 1.7–7.7)
Neutrophils Relative %: 58 %
Platelets: 212 10*3/uL (ref 150–400)
RBC: 4.03 MIL/uL — ABNORMAL LOW (ref 4.22–5.81)
RDW: 12.9 % (ref 11.5–15.5)
WBC: 6.9 10*3/uL (ref 4.0–10.5)
nRBC: 0 % (ref 0.0–0.2)

## 2020-07-12 LAB — COMPREHENSIVE METABOLIC PANEL
ALT: 21 U/L (ref 0–44)
AST: 21 U/L (ref 15–41)
Albumin: 3.9 g/dL (ref 3.5–5.0)
Alkaline Phosphatase: 92 U/L (ref 38–126)
Anion gap: 15 (ref 5–15)
BUN: 19 mg/dL (ref 8–23)
CO2: 24 mmol/L (ref 22–32)
Calcium: 9.5 mg/dL (ref 8.9–10.3)
Chloride: 101 mmol/L (ref 98–111)
Creatinine, Ser: 1.12 mg/dL (ref 0.61–1.24)
GFR, Estimated: >60 mL/min (ref >60)
Glucose, Bld: 232 mg/dL — ABNORMAL HIGH (ref 70–99)
Potassium: 4.5 mmol/L (ref 3.5–5.1)
Sodium: 140 mmol/L (ref 135–145)
Total Bilirubin: 0.7 mg/dL (ref 0.3–1.2)
Total Protein: 7.5 g/dL (ref 6.5–8.1)

## 2020-07-12 LAB — SEDIMENTATION RATE: Sed Rate: 21 mm/hr — ABNORMAL HIGH (ref 0–16)

## 2020-07-12 LAB — HEPATITIS C ANTIBODY: HCV Ab: NONREACTIVE

## 2020-07-12 NOTE — Patient Instructions (Addendum)
You are here for follow up of HIV- you currently are on Triumeq- we may be able to Change to dovato after today's labs- also you need to get shingrix ( zoster vaccine) If not available at [facility] clinic you  Can get it any pharmacy

## 2020-07-12 NOTE — Progress Notes (Signed)
NAME: Danny Cook.  DOB: 10-20-1960  MRN: [id number]  Date/Time: 07/12/2020 10:33 AM   Subjective:  Follow up visit-   Pt currently lives in group home Danny Cook ?Danny Cook. is a 60  y.o. male with a history of HIV/DM/CHF/schizophrenia, anxiety Follow up for HIV Last seen June 2021 Currently on Triumeq and is 100% adherent. Doing well No new complaints Last vl and cd4 was from Jan 2021 Triple vaccinated for Covid Not sexually active in many years  HIV diagnosed 57s Nad[place] Cd4 <50 HAARt history Triumeq since 2016 Prior regimen- epzicom+ reyataz+norvir  Acquired thrusex with men Genotype-NK  Medical History CKD FSGS Hyperlipidemia Anemia- small M spike-  Pericardial effusion- resolved Left pleural effusion HTN Adrenal adenoma High Esr- much improved from > 140 to 35  Neg Autoimmune work up t-  ? ?   Past Surgical History:  Procedure Laterality Date  . CARDIAC CATHETERIZATION    . COLONOSCOPY WITH PROPOFOL N/A 09/03/2015   Procedure: COLONOSCOPY WITH PROPOFOL;  Surgeon: Lollie Sails, MD;  Location: [facility] ENDOSCOPY;  Service: Endoscopy;  Laterality: N/A;  . DG TEETH FULL    . ESOPHAGOGASTRODUODENOSCOPY (EGD) WITH PROPOFOL N/A 07/01/2016   Procedure: ESOPHAGOGASTRODUODENOSCOPY (EGD) WITH PROPOFOL;  Surgeon: Lollie Sails, MD;  Location: [facility] ENDOSCOPY;  Service: Endoscopy;  Laterality: N/A;    Social History   Socioeconomic History  . Marital status: Single    Spouse name: Not on file  . Number of children: Not on file  . Years of education: 5  . Highest education level: GED or equivalent  Occupational History  . Not on file  Tobacco Use  . Smoking status: Never Smoker  . Smokeless tobacco: Never Used  Vaping Use  . Vaping Use: Never used  Substance and Sexual Activity  . Alcohol use: No  . Drug use: No  . Sexual activity: Never  Other Topics Concern  . Not on file  Social History Narrative  . Not  on file   Social Determinants of Health   Financial Resource Strain: Not on file  Food Insecurity: Not on file  Transportation Needs: Not on file  Physical Activity: Not on file  Stress: Not on file  Social Connections: Not on file  Intimate Partner Violence: Not on file    Family History  Problem Relation Age of Onset  . Heart attack Mother   . Diabetes Mellitus II Mother   . Mental illness Mother   . CAD Mother   . Heart attack Father   . CAD Father   . Hypertension Father    Allergies  Allergen Reactions  . Trazodone And Nefazodone     Affects other meds that they dont work  . Trazodone Hcl Other (See Comments)  . Viread [Tenofovir Disoproxil]     Damages kidneys  . Etodolac Rash   ? Current Outpatient Medications  Medication Sig Dispense Refill  . abacavir-dolutegravir-lamiVUDine (TRIUMEQ) 600-50-300 MG tablet Take 1 tablet by mouth at bedtime. 30 tablet 6  . albuterol (PROVENTIL HFA;VENTOLIN HFA) 108 (90 Base) MCG/ACT inhaler Inhale 2 puffs into the lungs every 6 (six) hours as needed for wheezing or shortness of breath. 1 Inhaler 2  . aspirin EC 325 MG tablet Take 325 mg by mouth daily.    . butalbital-aspirin-caffeine (FIORINAL) 50-325-40 MG capsule Take 1-2 capsules by mouth every 6 (six) hours as needed for headache.     . cyanocobalamin 1000 MCG tablet Take 1,000 mcg by mouth  daily.     . diazepam (VALIUM) 5 MG tablet Take 5 mg by mouth 2 (two) times daily as needed for anxiety.     . diphenhydrAMINE (BENADRYL) 25 mg capsule Take 25 mg by mouth at bedtime.    . divalproex (DEPAKOTE ER) 500 MG 24 hr tablet Take 500 mg by mouth at bedtime.     . docusate sodium (COLACE) 250 MG capsule Take 250 mg by mouth daily.    [name] donepezil (ARICEPT) 5 MG tablet Take 10 mg by mouth daily.     [name] FANAPT 12 MG TABS Take 12 mg by mouth 2 (two) times daily.     . ferrous sulfate 325 (65 FE) MG tablet Take 325 mg by mouth daily with breakfast.    . fluticasone (FLOVENT HFA) 220  MCG/ACT inhaler Inhale 2 puffs into the lungs 2 (two) times daily. Rinse out mouth afterwards 1 Inhaler 12  . gabapentin (NEURONTIN) 400 MG capsule Take 400 mg by mouth 3 (three) times daily.     [name] gemfibrozil (LOPID) 600 MG tablet Take 600 mg by mouth 2 (two) times daily before a meal.    . glimepiride (AMARYL) 4 MG tablet Take 4 mg by mouth daily with breakfast.     . glycopyrrolate (ROBINUL) 1 MG tablet Take 1 mg by mouth 2 (two) times daily.    . Insulin Aspart (NOVOLOG FLEXPEN [place]) Inject into the skin. Sliding scale    . Insulin Degludec (TRESIBA FLEXTOUCH) 200 UNIT/ML SOPN Inject 10 Units into the skin daily. (Patient taking differently: Inject 50 Units into the skin daily.)    . losartan (COZAAR) 25 MG tablet Take 1 tablet (25 mg total) by mouth daily. 30 tablet 0  . memantine (NAMENDA) 10 MG tablet Take 10 mg by mouth 2 (two) times daily.    . metFORMIN (GLUCOPHAGE) 1000 MG tablet Take 1,000 mg by mouth 2 (two) times daily with a meal.    . metoprolol tartrate (LOPRESSOR) 25 MG tablet Take 0.5 tablets (12.5 mg total) by mouth 2 (two) times daily. 30 tablet 0  . mirtazapine (REMERON) 30 MG tablet Take 30 mg by mouth at bedtime.    . mometasone (ELOCON) 0.1 % ointment Apply topically daily. Apply 4 drops to ear canal at bedtime as needed for itching and dry skin.    . Multiple Vitamin (MULTIVITAMIN) tablet Take 1 tablet by mouth daily.    . nitroGLYCERIN (NITROSTAT) 0.4 MG SL tablet Place 0.4 mg under the tongue every 5 (five) minutes as needed for chest pain.     . Omega-3 Fatty Acids (FISH OIL) 1000 MG CAPS Take 1,000 mg by mouth daily.    [name] omeprazole (PRILOSEC) 40 MG capsule Take 40 mg by mouth 2 (two) times daily.     [name] OZEMPIC, 0.25 OR 0.5 MG/DOSE, 2 MG/1.5ML SOPN Inject 1 mg into the skin once a week.     . rosuvastatin (CRESTOR) 40 MG tablet Take 40 mg by mouth daily.     [name] senna (SENOKOT) 8.6 MG TABS tablet Take 1 tablet by mouth daily.     . sucralfate (CARAFATE) 1 g tablet Take 1  g by mouth 4 (four) times daily.     . tamsulosin (FLOMAX) 0.4 MG CAPS capsule Take 0.4 mg by mouth daily.    [name] triamcinolone cream (KENALOG) 0.1 % Apply 1 application topically 2 (two) times daily as needed. (Patient taking differently: Apply 1 application topically 2 (two) times daily.) 30 g 0  .  venlafaxine XR (EFFEXOR-XR) 75 MG 24 hr capsule Take 1 capsule (75 mg total) by mouth daily with breakfast. 30 capsule 0  . ziprasidone (GEODON) 20 MG capsule Take 20 mg by mouth daily.     No current facility-administered medications for this visit.    REVIEW OF SYSTEMS:  Const: negative fever, negative chills, negative weight loss Eyes: negative diplopia or visual changes, negative eye pain ENT: negative coryza, negative sore throat Resp: negative cough, hemoptysis, dyspnea Cards: negative for chest pain, palpitations, lower extremity edema GU: negative for frequency, dysuria and hematuria Skin: negative for rash and pruritus Heme: negative for easy bruising and gum/nose bleeding MS: , back pain and leg pain. muscle weakness Neurolo: headaches memory problems  Psych: schizhoprenia Objective:  VITALS:  BP 118/75   Pulse 82   Temp 98.3 F (36.8 C)   Resp 16   Ht 5' 7"  (1.702 m)   Wt 214 lb (97.1 kg)   BMI 33.52 kg/m PHYSICAL EXAM:  General:  In wheel chair, no distress Head: Normocephalic, without obvious abnormality, atraumatic. Eyes: Conjunctivae clear, anicteric sclerae. Pupils are equal No nystagmus Nose: Nares normal. No drainage or sinus tenderness. edentulous Neck: Supple, symmetrical, no adenopathy, thyroid: non tender no carotid bruit and no JVD. Lungs: Clear to auscultation bilaterally. No Wheezing or Rhonchi. No rales. Heart: Regular rate and rhythm, no murmur, rub or gallop. Abdomen: Soft,  distended. Bowel sounds normal. No masses Extremities: Extremities normal, atraumatic, no cyanosis. No edema. No clubbing Lymph: Cervical, supraclavicular normal. Neurologic:  Grossly non-focal Health maintenance Vaccination  Vaccine Date last given comment  Influenza 03/15/18   Hepatitis B    Hepatitis A    Prevnar-PCV-13 08/10/18   Pneumovac-PPSV-23 08/01/17   TdaP 08/10/18   HPV    Shingrix ( zoster vaccine)     ______________________  Labs Lab Result  Date comment  HIV VL <20 12/30/18   CD4 1030 ( 36) 12/30/18   Genotype     [id number]     HIV antibody     RPR NR 07/21/19   Quantiferon Gold NEG 04/28/18   Hep C ab NR 07/12/20   Hepatitis B-ab,ag,c Core ab positive 04/16/15   Hepatitis A-IgM, IgG /T     Lipid 156/219/38/74 12/12/19   GC/CHL     PAP     HB,PLT,Cr, LFT 13./212/1.12/N 07/12/20     Preventive  Procedure Result  Date comment  colonoscopy  March 2021        Dental exam edentulous    Opthal       Impression/Recommendation  ? ?AIDS : well controlled- last Cd4 from Jan 2021 is 850 and Vl 40. On triumeq-100% adherent. ?will change triumeq to [facility] after getting the result of genosure ? 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  Bone marrow(04/30/18) showed hypercellular bone marrow with 4% plasma cells negative for [place] red stain. Serum electrophoresis had a small M spike- unclear significance  DM-on liraglutide ( victoza) and Tresiba, metformin and glimepiride- saw his endocrinologist today  Hyperlipidemia on Rosuvastatin  Hypertriglyceridemia-  on gemfibrozil- normalized HTN -losartan, metoprolol  paranoid schizophrenia  Fully vaccinated for covid- booster received Needs shingrix vaccine ? Follow-up 6 months ?labs  today-  Discussed with patient in detail

## 2020-07-13 LAB — T-HELPER CELLS CD4/CD8 %
% CD 4 Pos. Lymph.: 39.6 % (ref 30.8–58.5)
Absolute CD 4 Helper: 752 /uL (ref 359–1519)
Basophils Absolute: 0.1 10*3/uL (ref 0.0–0.2)
Basos: 1 %
CD3+CD4+ Cells/CD3+CD8+ Cells Bld: 1.11 (ref 0.92–3.72)
CD3+CD8+ Cells # Bld: 676 /uL (ref 109–897)
CD3+CD8+ Cells NFr Bld: 35.6 % — ABNORMAL HIGH (ref 12.0–35.5)
EOS (ABSOLUTE): 0.2 10*3/uL (ref 0.0–0.4)
Eos: 3 %
Hematocrit: 39.1 % (ref 37.5–51.0)
Hemoglobin: 13.4 g/dL (ref 13.0–17.7)
Immature Grans (Abs): 0.1 10*3/uL (ref 0.0–0.1)
Immature Granulocytes: 1 %
Lymphocytes Absolute: 1.9 10*3/uL (ref 0.7–3.1)
Lymphs: 31 %
MCH: 32.3 pg (ref 26.6–33.0)
MCHC: 34.3 g/dL (ref 31.5–35.7)
MCV: 94 fL (ref 79–97)
Monocytes Absolute: 0.6 10*3/uL (ref 0.1–0.9)
Monocytes: 10 %
Neutrophils Absolute: 3.5 10*3/uL (ref 1.4–7.0)
Neutrophils: 54 %
Platelets: 228 10*3/uL (ref 150–450)
RBC: 4.15 x10E6/uL (ref 4.14–5.80)
RDW: 12.9 % (ref 11.6–15.4)
WBC: 6.3 10*3/uL (ref 3.4–10.8)

## 2020-07-13 LAB — RPR: RPR Ser Ql: NONREACTIVE

## 2020-07-14 LAB — QUANTIFERON-TB GOLD PLUS: QuantiFERON-TB Gold Plus: NEGATIVE

## 2020-07-14 LAB — HIV-1 RNA QUANT-NO REFLEX-BLD
HIV 1 RNA Quant: <20 copies/mL
LOG10 HIV-1 RNA: UNDETERMINED log10copy/mL

## 2020-07-14 LAB — QUANTIFERON-TB GOLD PLUS (RQFGPL)
QuantiFERON Mitogen Value: >10 IU/mL
QuantiFERON Nil Value: 0.21 IU/mL
QuantiFERON TB1 Ag Value: 0.28 IU/mL
QuantiFERON TB2 Ag Value: 0.3 IU/mL

## 2020-07-25 ENCOUNTER — Other Ambulatory Visit: Payer: Self-pay

## 2020-07-25 ENCOUNTER — Ambulatory Visit
Admission: RE | Admit: 2020-07-25 | Discharge: 2020-07-25 | Disposition: A | Discharge status: Home / Self Care | Payer: Medicare Other | Source: Ambulatory Visit | Attending: Family | Admitting: Family

## 2020-07-25 DIAGNOSIS — I351 Nonrheumatic aortic (valve) insufficiency: Secondary | ICD-10-CM | POA: Insufficient documentation

## 2020-07-25 DIAGNOSIS — E1122 Type 2 diabetes mellitus with diabetic chronic kidney disease: Secondary | ICD-10-CM | POA: Diagnosis not present

## 2020-07-25 DIAGNOSIS — I5032 Chronic diastolic (congestive) heart failure: Secondary | ICD-10-CM | POA: Insufficient documentation

## 2020-07-25 DIAGNOSIS — G4730 Sleep apnea, unspecified: Secondary | ICD-10-CM | POA: Diagnosis not present

## 2020-07-25 DIAGNOSIS — G459 Transient cerebral ischemic attack, unspecified: Secondary | ICD-10-CM | POA: Diagnosis not present

## 2020-07-25 DIAGNOSIS — N189 Chronic kidney disease, unspecified: Secondary | ICD-10-CM | POA: Insufficient documentation

## 2020-07-25 DIAGNOSIS — I130 Hypertensive heart and chronic kidney disease with heart failure and stage 1 through stage 4 chronic kidney disease, or unspecified chronic kidney disease: Secondary | ICD-10-CM | POA: Diagnosis not present

## 2020-07-25 LAB — ECHOCARDIOGRAM COMPLETE
AR max vel: 3.06 cm2
AV Area VTI: 3.05 cm2
AV Area mean vel: 2.84 cm2
AV Mean grad: 5 mmHg
AV Peak grad: 8.3 mmHg
Ao pk vel: 1.44 m/s
Area-P 1/2: 3.12 cm2
MV VTI: 3.51 cm2
S' Lateral: 2.6 cm

## 2020-07-25 NOTE — Progress Notes (Signed)
*  PRELIMINARY RESULTS* Echocardiogram 2D Echocardiogram has been performed.  Danny Cook 07/25/2020, 10:24 AM

## 2020-07-29 ENCOUNTER — Encounter (HOSPITAL_COMMUNITY): Payer: Self-pay

## 2020-07-29 ENCOUNTER — Inpatient Hospital Stay (HOSPITAL_COMMUNITY)
Admission: EM | Admit: 2020-07-29 | Discharge: 2020-07-31 | DRG: 683 | Disposition: A | Discharge status: Home / Self Care | Payer: Medicare Other | Attending: Internal Medicine | Admitting: Internal Medicine

## 2020-07-29 ENCOUNTER — Emergency Department (HOSPITAL_COMMUNITY): Payer: Medicare Other

## 2020-07-29 DIAGNOSIS — G4730 Sleep apnea, unspecified: Secondary | ICD-10-CM | POA: Diagnosis present

## 2020-07-29 DIAGNOSIS — B20 Human immunodeficiency virus [HIV] disease: Secondary | ICD-10-CM | POA: Diagnosis present

## 2020-07-29 DIAGNOSIS — E1122 Type 2 diabetes mellitus with diabetic chronic kidney disease: Secondary | ICD-10-CM | POA: Diagnosis present

## 2020-07-29 DIAGNOSIS — Z833 Family history of diabetes mellitus: Secondary | ICD-10-CM

## 2020-07-29 DIAGNOSIS — E11649 Type 2 diabetes mellitus with hypoglycemia without coma: Secondary | ICD-10-CM | POA: Diagnosis present

## 2020-07-29 DIAGNOSIS — K810 Acute cholecystitis: Secondary | ICD-10-CM | POA: Diagnosis present

## 2020-07-29 DIAGNOSIS — R1030 Lower abdominal pain, unspecified: Secondary | ICD-10-CM | POA: Diagnosis not present

## 2020-07-29 DIAGNOSIS — E782 Mixed hyperlipidemia: Secondary | ICD-10-CM | POA: Diagnosis not present

## 2020-07-29 DIAGNOSIS — N1831 Chronic kidney disease, stage 3a: Secondary | ICD-10-CM

## 2020-07-29 DIAGNOSIS — I10 Essential (primary) hypertension: Secondary | ICD-10-CM | POA: Diagnosis not present

## 2020-07-29 DIAGNOSIS — K8050 Calculus of bile duct without cholangitis or cholecystitis without obstruction: Secondary | ICD-10-CM

## 2020-07-29 DIAGNOSIS — F419 Anxiety disorder, unspecified: Secondary | ICD-10-CM | POA: Diagnosis present

## 2020-07-29 DIAGNOSIS — Z8673 Personal history of transient ischemic attack (TIA), and cerebral infarction without residual deficits: Secondary | ICD-10-CM

## 2020-07-29 DIAGNOSIS — Z818 Family history of other mental and behavioral disorders: Secondary | ICD-10-CM

## 2020-07-29 DIAGNOSIS — R4189 Other symptoms and signs involving cognitive functions and awareness: Secondary | ICD-10-CM | POA: Diagnosis present

## 2020-07-29 DIAGNOSIS — M1990 Unspecified osteoarthritis, unspecified site: Secondary | ICD-10-CM | POA: Diagnosis present

## 2020-07-29 DIAGNOSIS — N179 Acute kidney failure, unspecified: Principal | ICD-10-CM

## 2020-07-29 DIAGNOSIS — I959 Hypotension, unspecified: Secondary | ICD-10-CM | POA: Diagnosis present

## 2020-07-29 DIAGNOSIS — I130 Hypertensive heart and chronic kidney disease with heart failure and stage 1 through stage 4 chronic kidney disease, or unspecified chronic kidney disease: Secondary | ICD-10-CM | POA: Diagnosis present

## 2020-07-29 DIAGNOSIS — Z20822 Contact with and (suspected) exposure to covid-19: Secondary | ICD-10-CM | POA: Diagnosis present

## 2020-07-29 DIAGNOSIS — I2510 Atherosclerotic heart disease of native coronary artery without angina pectoris: Secondary | ICD-10-CM | POA: Diagnosis present

## 2020-07-29 DIAGNOSIS — I5032 Chronic diastolic (congestive) heart failure: Secondary | ICD-10-CM | POA: Diagnosis not present

## 2020-07-29 DIAGNOSIS — K219 Gastro-esophageal reflux disease without esophagitis: Secondary | ICD-10-CM | POA: Diagnosis present

## 2020-07-29 DIAGNOSIS — E162 Hypoglycemia, unspecified: Secondary | ICD-10-CM

## 2020-07-29 DIAGNOSIS — F251 Schizoaffective disorder, depressive type: Secondary | ICD-10-CM | POA: Diagnosis not present

## 2020-07-29 DIAGNOSIS — N400 Enlarged prostate without lower urinary tract symptoms: Secondary | ICD-10-CM | POA: Diagnosis present

## 2020-07-29 DIAGNOSIS — E785 Hyperlipidemia, unspecified: Secondary | ICD-10-CM

## 2020-07-29 DIAGNOSIS — R109 Unspecified abdominal pain: Secondary | ICD-10-CM

## 2020-07-29 DIAGNOSIS — Z794 Long term (current) use of insulin: Secondary | ICD-10-CM

## 2020-07-29 DIAGNOSIS — Z8249 Family history of ischemic heart disease and other diseases of the circulatory system: Secondary | ICD-10-CM

## 2020-07-29 DIAGNOSIS — E7800 Pure hypercholesterolemia, unspecified: Secondary | ICD-10-CM | POA: Diagnosis present

## 2020-07-29 DIAGNOSIS — E119 Type 2 diabetes mellitus without complications: Secondary | ICD-10-CM

## 2020-07-29 DIAGNOSIS — D631 Anemia in chronic kidney disease: Secondary | ICD-10-CM | POA: Diagnosis present

## 2020-07-29 DIAGNOSIS — Z886 Allergy status to analgesic agent status: Secondary | ICD-10-CM

## 2020-07-29 DIAGNOSIS — Z8589 Personal history of malignant neoplasm of other organs and systems: Secondary | ICD-10-CM

## 2020-07-29 DIAGNOSIS — Z79899 Other long term (current) drug therapy: Secondary | ICD-10-CM

## 2020-07-29 DIAGNOSIS — F200 Paranoid schizophrenia: Secondary | ICD-10-CM | POA: Diagnosis present

## 2020-07-29 DIAGNOSIS — N269 Renal sclerosis, unspecified: Secondary | ICD-10-CM | POA: Diagnosis present

## 2020-07-29 DIAGNOSIS — Z21 Asymptomatic human immunodeficiency virus [HIV] infection status: Secondary | ICD-10-CM | POA: Diagnosis not present

## 2020-07-29 DIAGNOSIS — E860 Dehydration: Secondary | ICD-10-CM

## 2020-07-29 DIAGNOSIS — I440 Atrioventricular block, first degree: Secondary | ICD-10-CM | POA: Diagnosis present

## 2020-07-29 DIAGNOSIS — F0390 Unspecified dementia without behavioral disturbance: Secondary | ICD-10-CM | POA: Diagnosis present

## 2020-07-29 DIAGNOSIS — K760 Fatty (change of) liver, not elsewhere classified: Secondary | ICD-10-CM | POA: Diagnosis present

## 2020-07-29 DIAGNOSIS — Z7982 Long term (current) use of aspirin: Secondary | ICD-10-CM

## 2020-07-29 DIAGNOSIS — Z888 Allergy status to other drugs, medicaments and biological substances status: Secondary | ICD-10-CM

## 2020-07-29 DIAGNOSIS — I252 Old myocardial infarction: Secondary | ICD-10-CM

## 2020-07-29 DIAGNOSIS — E876 Hypokalemia: Secondary | ICD-10-CM | POA: Diagnosis not present

## 2020-07-29 LAB — COMPREHENSIVE METABOLIC PANEL
ALT: 17 U/L (ref 0–44)
AST: 15 U/L (ref 15–41)
Albumin: 3.5 g/dL (ref 3.5–5.0)
Alkaline Phosphatase: 68 U/L (ref 38–126)
Anion gap: 12 (ref 5–15)
BUN: 24 mg/dL — ABNORMAL HIGH (ref 8–23)
CO2: 20 mmol/L — ABNORMAL LOW (ref 22–32)
Calcium: 8.6 mg/dL — ABNORMAL LOW (ref 8.9–10.3)
Chloride: 106 mmol/L (ref 98–111)
Creatinine, Ser: 2.3 mg/dL — ABNORMAL HIGH (ref 0.61–1.24)
GFR, Estimated: 32 mL/min — ABNORMAL LOW (ref >60)
Glucose, Bld: 60 mg/dL — ABNORMAL LOW (ref 70–99)
Potassium: 3.5 mmol/L (ref 3.5–5.1)
Sodium: 138 mmol/L (ref 135–145)
Total Bilirubin: 0.4 mg/dL (ref 0.3–1.2)
Total Protein: 6.8 g/dL (ref 6.5–8.1)

## 2020-07-29 LAB — CBC WITH DIFFERENTIAL/PLATELET
Abs Immature Granulocytes: 0.01 10*3/uL (ref 0.00–0.07)
Basophils Absolute: 0.1 10*3/uL (ref 0.0–0.1)
Basophils Relative: 1 %
Eosinophils Absolute: 0.4 10*3/uL (ref 0.0–0.5)
Eosinophils Relative: 4 %
HCT: 35.8 % — ABNORMAL LOW (ref 39.0–52.0)
Hemoglobin: 11.8 g/dL — ABNORMAL LOW (ref 13.0–17.0)
Immature Granulocytes: 0 %
Lymphocytes Relative: 41 %
Lymphs Abs: 3.5 10*3/uL (ref 0.7–4.0)
MCH: 32.2 pg (ref 26.0–34.0)
MCHC: 33 g/dL (ref 30.0–36.0)
MCV: 97.8 fL (ref 80.0–100.0)
Monocytes Absolute: 0.8 10*3/uL (ref 0.1–1.0)
Monocytes Relative: 10 %
Neutro Abs: 3.8 10*3/uL (ref 1.7–7.7)
Neutrophils Relative %: 44 %
Platelets: 192 10*3/uL (ref 150–400)
RBC: 3.66 MIL/uL — ABNORMAL LOW (ref 4.22–5.81)
RDW: 13.4 % (ref 11.5–15.5)
WBC: 8.6 10*3/uL (ref 4.0–10.5)
nRBC: 0 % (ref 0.0–0.2)

## 2020-07-29 LAB — LACTIC ACID, PLASMA
Lactic Acid, Venous: 2.2 mmol/L (ref 0.5–1.9)
Lactic Acid, Venous: 1.9 mmol/L (ref 0.5–1.9)

## 2020-07-29 LAB — TROPONIN I (HIGH SENSITIVITY): Troponin I (High Sensitivity): 8 ng/L (ref <18)

## 2020-07-29 LAB — BRAIN NATRIURETIC PEPTIDE: B Natriuretic Peptide: 31 pg/mL (ref 0.0–100.0)

## 2020-07-29 LAB — CBG MONITORING, ED
Glucose-Capillary: 113 mg/dL — ABNORMAL HIGH (ref 70–99)
Glucose-Capillary: 57 mg/dL — ABNORMAL LOW (ref 70–99)

## 2020-07-29 LAB — SARS CORONAVIRUS 2 BY RT PCR (HOSPITAL ORDER, PERFORMED IN ~~LOC~~ HOSPITAL LAB): SARS Coronavirus 2: NEGATIVE

## 2020-07-29 MED ORDER — DEXTROSE 50 % IV SOLN
25.0000 mL | Freq: Once | INTRAVENOUS | Status: AC
  Administered 2020-07-29: 25 mL via INTRAVENOUS

## 2020-07-29 MED ORDER — SODIUM CHLORIDE 0.9 % IV SOLN
1.0000 g | Freq: Once | INTRAVENOUS | Status: AC
  Administered 2020-07-29: 1 g via INTRAVENOUS
  Filled 2020-07-29: qty 10

## 2020-07-29 MED ORDER — DEXTROSE 50 % IV SOLN
INTRAVENOUS | Status: AC
  Filled 2020-07-29: qty 50

## 2020-07-29 MED ORDER — SODIUM CHLORIDE 0.9 % IV BOLUS
1000.0000 mL | Freq: Once | INTRAVENOUS | Status: AC
  Administered 2020-07-29: 1000 mL via INTRAVENOUS

## 2020-07-29 MED ORDER — PIPERACILLIN-TAZOBACTAM 3.375 G IVPB 30 MIN
3.3750 g | Freq: Once | INTRAVENOUS | Status: AC
  Administered 2020-07-29: 3.375 g via INTRAVENOUS
  Filled 2020-07-29: qty 50

## 2020-07-29 NOTE — ED Provider Notes (Signed)
[facility] EMERGENCY DEPARTMENT Provider Note   CSN: [id number] Arrival date & time: 07/29/20  1845     History Chief Complaint  Patient presents with  . Hypotension    Danny Cook. is a 60 y.o. male.  HPI   Patient with significant medical history of CHF, chronic kidney disease, diabetes, hypertension, HIV presents with chief complaint of not feeling well.  Patient states this started yesterday had chills and nausea without vomiting.  This morning he started to feel worse, continuing chills, increasing nausea, feeling dizzy and loss of appetite.  He also endorses that he has been unable to urinate or have a bowel movement despite adequate fluid.  He was concerned for this and came into the emergency department.  He states he is vaccine against COVID-19, denies recent sick contacts.  He denies any alleviating factors.  Patient has no significant abdominal history, denies kidney stones, diverticulitis, pancreatitis, bowel obstruction.  Patient denies headaches, nasal congestion, sore throat, cough, chest pain, abdominal pain, worsening pedal edema.  Past Medical History:  Diagnosis Date  . Anemia   . Anxiety   . Arthritis   . CHF (congestive heart failure) ([place])   . Chronic kidney disease    Renal Insufficiency Syndrome; Glomerulosclerosis 2013  . Depressed   . Diabetes mellitus without complication ([place])   . GERD (gastroesophageal reflux disease)   . High cholesterol   . HIV (human immunodeficiency virus infection) ([place])   . Hypertension   . 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 Paranoid disorder ([place])   . Pneumonia   . Schizophrenia, paranoid ([place])   . Sleep apnea   . TIA (transient ischemic attack)     Patient Active Problem List   Diagnosis Date Noted  . Sepsis ([place]) 08/15/2019  . NSTEMI (non-ST elevated myocardial infarction) ([place]) 06/07/2018  . Lymphedema 05/12/2018  . Pleural effusion   . FUO (fever of unknown origin)   . MGUS (monoclonal gammopathy of  unknown significance)   . 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 Healthcare associated bacterial pneumonia 04/25/2018  . Community acquired pneumonia 04/12/2018  . Pneumonia 04/12/2018  . AKI (acute kidney injury) ([place]) 03/23/2018  . Pericarditis with effusion 03/23/2018  . Schizoaffective disorder, depressive type ([place]) 11/04/2017  . HIV positive ([place]) 11/04/2017  . Orthostatic dizziness 07/31/2017  . Chronic diastolic heart failure ([place]) 07/29/2017  . HTN (hypertension) 07/29/2017  . Diabetes ([place]) 07/29/2017  . Schizophrenia ([place]) 07/29/2017    Past Surgical History:  Procedure Laterality Date  . CARDIAC CATHETERIZATION    . COLONOSCOPY WITH PROPOFOL N/A 09/03/2015   Procedure: COLONOSCOPY WITH PROPOFOL;  Surgeon: Lollie Sails, MD;  Location: [facility] ENDOSCOPY;  Service: Endoscopy;  Laterality: N/A;  . DG TEETH FULL    . ESOPHAGOGASTRODUODENOSCOPY (EGD) WITH PROPOFOL N/A 07/01/2016   Procedure: ESOPHAGOGASTRODUODENOSCOPY (EGD) WITH PROPOFOL;  Surgeon: Lollie Sails, MD;  Location: [facility] ENDOSCOPY;  Service: Endoscopy;  Laterality: N/A;       Family History  Problem Relation Age of Onset  . Heart attack Mother   . Diabetes Mellitus II Mother   . Mental illness Mother   . CAD Mother   . Heart attack Father   . CAD Father   . Hypertension Father     Social History   Tobacco Use  . Smoking status: Never Smoker  . Smokeless tobacco: Never Used  Vaping Use  . Vaping Use: Never used  Substance Use Topics  . Alcohol use: No  . Drug  use: No    Home Medications Prior to Admission medications   Medication Sig Start Date End Date Taking? Authorizing Provider  abacavir-dolutegravir-lamiVUDine (TRIUMEQ) 600-50-300 MG tablet Take 1 tablet by mouth at bedtime. 07/21/19   Tsosie Billing, MD  albuterol (PROVENTIL HFA;VENTOLIN HFA) 108 (90 Base) MCG/ACT inhaler Inhale 2 puffs into the lungs every 6 (six) hours as needed for wheezing or shortness of  breath. 04/14/18   Loletha Grayer, MD  aspirin EC 325 MG tablet Take 325 mg by mouth daily.    [provider]  butalbital-aspirin-caffeine Acquanetta Chain) 50-325-40 MG capsule Take 1-2 capsules by mouth every 6 (six) hours as needed for headache.     [provider]  cyanocobalamin 1000 MCG tablet Take 1,000 mcg by mouth daily.     [provider]  diazepam (VALIUM) 5 MG tablet Take 5 mg by mouth 2 (two) times daily as needed for anxiety.     [provider]  diphenhydrAMINE (BENADRYL) 25 mg capsule Take 25 mg by mouth at bedtime.    [provider]  divalproex (DEPAKOTE ER) 500 MG 24 hr tablet Take 500 mg by mouth at bedtime.     [provider]  docusate sodium (COLACE) 250 MG capsule Take 250 mg by mouth daily.    [provider]  donepezil (ARICEPT) 5 MG tablet Take 10 mg by mouth daily.  07/19/19   [provider]  FANAPT 12 MG TABS Take 12 mg by mouth 2 (two) times daily.  11/14/16   [provider]  ferrous sulfate 325 (65 FE) MG tablet Take 325 mg by mouth daily with breakfast.    [provider]  fluticasone (FLOVENT HFA) 220 MCG/ACT inhaler Inhale 2 puffs into the lungs 2 (two) times daily. Rinse out mouth afterwards 04/14/18   Loletha Grayer, MD  gabapentin (NEURONTIN) 400 MG capsule Take 400 mg by mouth 3 (three) times daily.     [provider]  gemfibrozil (LOPID) 600 MG tablet Take 600 mg by mouth 2 (two) times daily before a meal.    [provider]  glimepiride (AMARYL) 4 MG tablet Take 4 mg by mouth daily with breakfast.  06/06/19   [provider]  glycopyrrolate (ROBINUL) 1 MG tablet Take 1 mg by mouth 2 (two) times daily.    [provider]  Insulin Aspart (NOVOLOG FLEXPEN [place]) Inject into the skin. Sliding scale    [provider]  Insulin Degludec (TRESIBA FLEXTOUCH) 200 UNIT/ML SOPN Inject 10 Units into the skin daily. Patient taking differently:  Inject 50 Units into the skin daily. 08/19/19   Nolberto Hanlon, MD  losartan (COZAAR) 25 MG tablet Take 1 tablet (25 mg total) by mouth daily. 05/04/18   Vaughan Basta, MD  memantine (NAMENDA) 10 MG tablet Take 10 mg by mouth 2 (two) times daily.    [provider]  metFORMIN (GLUCOPHAGE) 1000 MG tablet Take 1,000 mg by mouth 2 (two) times daily with a meal.    [provider]  metoprolol tartrate (LOPRESSOR) 25 MG tablet Take 0.5 tablets (12.5 mg total) by mouth 2 (two) times daily. 05/03/18   Vaughan Basta, MD  mirtazapine (REMERON) 30 MG tablet Take 30 mg by mouth at bedtime. 04/21/18   [provider]  mometasone (ELOCON) 0.1 % ointment Apply topically daily. Apply 4 drops to ear canal at bedtime as needed for itching and dry skin.    [provider]  Multiple Vitamin (MULTIVITAMIN) tablet Take 1 tablet  by mouth daily.    [provider]  nitroGLYCERIN (NITROSTAT) 0.4 MG SL tablet Place 0.4 mg under the tongue every 5 (five) minutes as needed for chest pain.     [provider]  Omega-3 Fatty Acids (FISH OIL) 1000 MG CAPS Take 1,000 mg by mouth daily.    [provider]  omeprazole (PRILOSEC) 40 MG capsule Take 40 mg by mouth 2 (two) times daily.     [provider]  OZEMPIC, 0.25 OR 0.5 MG/DOSE, 2 MG/1.5ML SOPN Inject 1 mg into the skin once a week.  06/06/19   [provider]  rosuvastatin (CRESTOR) 40 MG tablet Take 40 mg by mouth daily.     [provider]  senna (SENOKOT) 8.6 MG TABS tablet Take 1 tablet by mouth daily.     [provider]  sucralfate (CARAFATE) 1 g tablet Take 1 g by mouth 4 (four) times daily.  04/21/18   [provider]  tamsulosin (FLOMAX) 0.4 MG CAPS capsule Take 0.4 mg by mouth daily. 08/02/19   [provider]  triamcinolone cream (KENALOG) 0.1 % Apply 1 application topically 2 (two) times daily as needed. Patient taking differently: Apply 1  application topically 2 (two) times daily. 11/16/16   Little, Traci M, PA-C  venlafaxine XR (EFFEXOR-XR) 75 MG 24 hr capsule Take 1 capsule (75 mg total) by mouth daily with breakfast. 11/09/17   McNew, Tyson Babinski, MD  ziprasidone (GEODON) 20 MG capsule Take 20 mg by mouth daily.    [provider]    Allergies    Trazodone and nefazodone, Trazodone hcl, Viread [tenofovir disoproxil], and Etodolac  Review of Systems   Review of Systems  Constitutional: Positive for appetite change and chills. Negative for fever.  HENT: Negative for congestion and sore throat.   Respiratory: Positive for cough. Negative for shortness of breath.   Cardiovascular: Negative for chest pain.  Gastrointestinal: Positive for constipation and nausea. Negative for abdominal pain and vomiting.  Genitourinary: Positive for difficulty urinating. Negative for dysuria, enuresis, flank pain, scrotal swelling and testicular pain.  Musculoskeletal: Negative for back pain.  Skin: Negative for rash.  Neurological: Positive for weakness. Negative for dizziness and headaches.  Hematological: Does not bruise/bleed easily.    Physical Exam Updated Vital Signs BP (!) 109/58   Pulse 64   Temp 98.4 F (36.9 C) (Oral)   Resp 16   Ht 5\' 7"  (1.702 m)   Wt 97.1 kg   SpO2 96%   BMI 33.53 kg/m   Physical Exam Vitals and nursing note reviewed.  Constitutional:      General: He is not in acute distress.    Appearance: He is not ill-appearing.     Comments: Patient appears to be in a deconditioned state.  HENT:     Head: Normocephalic and atraumatic.     Nose: No congestion.     Mouth/Throat:     Mouth: Mucous membranes are dry.     Pharynx: Oropharynx is clear.  Eyes:     Conjunctiva/sclera: Conjunctivae normal.  Cardiovascular:     Rate and Rhythm: Normal rate and regular rhythm.     Pulses: Normal pulses.     Heart sounds: No murmur heard. No friction rub. No gallop.   Pulmonary:     Effort: No respiratory  distress.     Breath sounds: No wheezing, rhonchi or rales.  Abdominal:     Palpations: Abdomen is soft.     Tenderness: There  is abdominal tenderness.     Comments: Patient's abdomen was distended, normoactive bowel sounds, dull to percussion.  He had slight epigastric pain and left lower quadrant pain, negative Murphy sign, negative McBurney point, negative rebound tenderness, no CVA tenderness.  Musculoskeletal:     Right lower leg: No edema.     Left lower leg: No edema.     Comments: Patient moving all 4 extremities at difficulty.  Skin:    General: Skin is warm and dry.     Findings: No rash.  Neurological:     Mental Status: He is alert.  Psychiatric:        Mood and Affect: Mood normal.     ED Results / Procedures / Treatments   Labs (all labs ordered are listed, but only abnormal results are displayed) Labs Reviewed  COMPREHENSIVE METABOLIC PANEL - Abnormal; Notable for the following components:      Result Value   CO2 20 (*)    Glucose, Bld 60 (*)    BUN 24 (*)    Creatinine, Ser 2.30 (*)    Calcium 8.6 (*)    GFR, Estimated 32 (*)    All other components within normal limits  CBC WITH DIFFERENTIAL/PLATELET - Abnormal; Notable for the following components:   RBC 3.66 (*)    Hemoglobin 11.8 (*)    HCT 35.8 (*)    All other components within normal limits  LACTIC ACID, PLASMA - Abnormal; Notable for the following components:   Lactic Acid, Venous 2.2 (*)    All other components within normal limits  CBG MONITORING, ED - Abnormal; Notable for the following components:   Glucose-Capillary 57 (*)    All other components within normal limits  CBG MONITORING, ED - Abnormal; Notable for the following components:   Glucose-Capillary 113 (*)    All other components within normal limits  CULTURE, BLOOD (ROUTINE X 2)  CULTURE, BLOOD (ROUTINE X 2)  SARS CORONAVIRUS 2 BY RT PCR (HOSPITAL ORDER, [place])  BRAIN NATRIURETIC PEPTIDE  LACTIC  ACID, PLASMA  URINALYSIS, ROUTINE W REFLEX MICROSCOPIC  TROPONIN I (HIGH SENSITIVITY)    EKG None  Radiology CT ABDOMEN PELVIS WO CONTRAST  Result Date: 07/29/2020 CLINICAL DATA:  Abdominal distension. EXAM: CT ABDOMEN AND PELVIS WITHOUT CONTRAST TECHNIQUE: Multidetector CT imaging of the abdomen and pelvis was performed following the standard protocol without IV contrast. COMPARISON:  CT dated 08/15/2019 FINDINGS: Lower chest: The lung bases are clear. The heart size is normal. Hepatobiliary: There appears to be heterogeneous hepatic steatosis. There is diffuse wall thickening of the gallbladder with adjacent fat stranding. There is a potential stone in the distal common bile duct measuring approximately 7 mm (axial series 3, image 37.There is no significant intrahepatic biliary ductal dilatation. Pancreas: Normal contours without ductal dilatation. No peripancreatic fluid collection. Spleen: Unremarkable. Adrenals/Urinary Tract: --Adrenal glands: There is a stable left adrenal mass. --Right kidney/ureter: There is a nonobstructing stone in the upper pole the right kidney measuring approximately 1.1 cm. --Left kidney/ureter: No hydronephrosis or radiopaque kidney stones. --Urinary bladder: Unremarkable. Stomach/Bowel: --Stomach/Duodenum: No hiatal hernia or other gastric abnormality. Normal duodenal course and caliber. --Small bowel: Unremarkable. --Colon: Unremarkable. --Appendix: Normal. Vascular/Lymphatic: Atherosclerotic calcification is present within the non-aneurysmal abdominal aorta, without hemodynamically significant stenosis. --No retroperitoneal lymphadenopathy. --No mesenteric lymphadenopathy. --No pelvic or inguinal lymphadenopathy. Reproductive: Unremarkable Other: No ascites or free air. The abdominal wall is normal. Musculoskeletal. No acute displaced fractures. IMPRESSION: 1. Diffuse wall thickening  of the gallbladder with adjacent fat stranding, concerning for acute cholecystitis. 2.  There is a potential 7 mm stone in the distal common bile duct. There is no significant intrahepatic biliary ductal dilatation. 3. Heterogeneous hepatic steatosis. 4. Nonobstructing right nephrolithiasis. Aortic Atherosclerosis (ICD10-I70.0). Electronically Signed   By: Constance Holster M.D.   On: 07/29/2020 22:26   DG Chest 2 View  Result Date: 07/29/2020 CLINICAL DATA:  Cough. EXAM: CHEST - 2 VIEW COMPARISON:  08/14/2019 FINDINGS: Stable mildly enlarged cardiac silhouette. Clear lungs with normal vascularity. Thoracic spine degenerative changes. IMPRESSION: No acute abnormality. Electronically Signed   By: Claudie Revering M.D.   On: 07/29/2020 19:59    Procedures .Critical Care Performed by: Marcello Fennel, PA-C Authorized by: Marcello Fennel, PA-C   Critical care provider statement:    Critical care time (minutes):  45   Critical care time was exclusive of:  Separately billable procedures and treating other patients   Critical care was necessary to treat or prevent imminent or life-threatening deterioration of the following conditions:  Dehydration   Critical care was time spent personally by me on the following activities:  Discussions with consultants, evaluation of patient's response to treatment, examination of patient, ordering and performing treatments and interventions, ordering and review of laboratory studies, ordering and review of radiographic studies, pulse oximetry, re-evaluation of patient's condition, obtaining history from patient or surrogate and review of old charts   I assumed direction of critical care for this patient from another provider in my specialty: yes       Medications Ordered in ED Medications  cefTRIAXone (ROCEPHIN) 1 g in sodium chloride 0.9 % 100 mL IVPB (0 g Intravenous Stopped 07/29/20 2227)  sodium chloride 0.9 % bolus 1,000 mL (0 mLs Intravenous Stopped 07/29/20 2252)  dextrose 50 % solution 25 mL (0 mLs Intravenous Hold 07/29/20 2254)   piperacillin-tazobactam (ZOSYN) IVPB 3.375 g (3.375 g Intravenous New Bag/Given 07/29/20 2301)    ED Course  I have reviewed the triage vital signs and the nursing notes.  Pertinent labs & imaging results that were available during my care of the patient were reviewed by me and considered in my medical decision making (see chart for details).    MDM Rules/Calculators/A&P                          Initial impression-patient presents with urinary retention not feeling well.  He is alert, does not appear in acute distress, vital signs show hypotension.  Concern for sepsis, dehydration, CHF exacerbation, atypical ACS, urinary retention.  Will obtain basic lab work, EKG, chest x-ray further evaluation.  Will start patient on fluids, cover him for possible sepsis with ceftriaxone.  Work-up-CBC shows normocytic anemia appears to be a baseline for patient. CMP shows decreased CO2 of 20, glucose of 60, increase from baseline in BUN 24, increased from baseline creatinine 2.3. of lactic is 2.2. Initial troponin is 8. BNP 31 chest x-ray does not reveal any acute findings. EKG sinus rhythm with first-degree AV heart block. No signs of ischemia present.  CT abdomen pelvis shows gallbladder wall thickening with stranding, with a 7 mm stone in the distal common bile duct concerning for cholecystitis.  Consult due to CT findings for cholecystitis will consult with general surgery for further recommendations.  Spoke with Dr. Arnoldo Morale of general surgery, he will add him to stress, recommends admit to medicine, and GI consult. Spoke with Dr. Josephine Cables of the hospitalist  team, he has accepted the patient. He will come down and evaluate the patient.  Reassessment patient is reevaluated, continues to have left lower quadrant pain, has no other complaints at this time.  Patient has responded well to fluid resuscitation, BP 109/58, lactic has improved.  patient is noted to be hypoglycemic will provide patient with amp D50 and  continue to monitor CBG.  Rule out- I have low suspicion for systemic infection as there is no leukocytosis seen on CBC, there is no elevation of heart rate. Patient is noted to be hypotensive and have a lactic of 2.2 I suspect this may be more secondary due to dehydration. Low suspicion for CHF exacerbation as there is no signs of fluid overload on exam, BNP is within normal limits, no pulmonary edema seen on chest x-ray. Low suspicion for ACS as patient has chest pain, shortness of breath, no signs of ischemia seen on EKG, initial troponin is 8. Will defer second troponin as patient has been chest pain-free for over 12 hours. Low suspicion for pneumonia as lung sounds are clear bilaterally, chest x-ray does not reveal any acute findings.  Patient is noted to be hypoglycemic, patient is a known diabetic, takes insulin, states he took it today without eating anything which I suspect caused him to have hypoglycemia.  Plan- suspect patient's abdominal pain is secondary due to acute cholecystitis and his AKI is secondary to poor intake due to nausea. Anticipate patient will need overnight hospital admission, further and IV antibiotics and possible surgery. Patient care will be transferred to admitting team for further evaluation.     Final Clinical Impression(s) / ED Diagnoses Final diagnoses:  Acute cholecystitis  AKI (acute kidney injury) ([place])  Dehydration    Rx / DC Orders ED Discharge Orders    None       Aron Baba 07/29/20 2333    Noemi Chapel, MD 08/01/20 1520

## 2020-07-29 NOTE — ED Notes (Signed)
Date and time results received: 07/29/20 9:13 PM  (use smartphrase ".now" to insert current time)  Test:Lactic acid Critical Value: 2.2  Name of Provider Notified: PA Deno Etienne  Orders Received? Or Actions Taken?:N/a

## 2020-07-29 NOTE — ED Provider Notes (Signed)
This patient is from a group home, comes in with a complaint of difficulty urinating and some lower abdominal discomfort, noted to be hypotensive prehospital.  On my exam the patient does have easily palpable radial artery pulses however his blood pressure is measured low at 82/63 at this time.  He has no edema, no rashes, clear oropharynx though it is a bit dry, clear heart and lung sounds without tachycardia or rales, no distress.  He complains of abdominal discomfort, there is no fullness masses or peritoneal signs in fact he is not guarding but does complain of abdominal discomfort with palpation left more than right.  Labs and a urinalysis pending, anticipate CT scan.  Fluids will be given as well as a dose of Rocephin to cover for possible sepsis, the patient is agreeable to the plan  Medical screening examination/treatment/procedure(s) were conducted as a shared visit with non-physician practitioner(s) and myself.  I personally evaluated the patient during the encounter.  Clinical Impression:   Final diagnoses:  Acute cholecystitis  AKI (acute kidney injury) ([place])  Dehydration         Noemi Chapel, MD 08/01/20 1521

## 2020-07-29 NOTE — H&P (Signed)
History and Physical  Danny Cook. ON:[id number] DOB: 1961/03/21 DOA: 07/29/2020  Referring physician: Marcello Fennel, PA-C PCP: Ebbie Ridge, MD  Patient coming from: Group home  Chief Complaint: Abdominal pain  HPI: Danny Cook. is a 60 y.o. male with medical history significant for hypertension, hyperlipidemia, HIV, T2DM, BPH, dementia and obesity who presents to the emergency department via EMS due to abdominal pain and generally not feeling well which started yesterday (1/29) with nausea, chills but no vomiting.  On waking up this morning, the discomfort continued with bilateral lower abdominal pain, worsening nausea, chills, loss of appetite and inability to urinate during all day (without complaint of burning sensation on urination or any other irritative bladder symptoms), there was no known aggravating/elevating factors to his symptoms.  Group home activated EMS for patient to be taken to the ED for further evaluation.  He states that he was already vaccinated against COVID-19 virus.  ED Course:  In the emergency department, he was 82/63.  Other vital signs were within normal range.  Work-up in the ED showed normocytic anemia, BUN/creatinine 24/2.30 (baseline creatinine 1.0-1.3).  Lactic acid 2.2 > 1.9.  BNP 31, troponin x1- 8.  SARS coronavirus 2 was negative CT abdomen and pelvis without contrast showed diffuse wall thickening of the gallbladder with adjacent fat stranding concerning for acute cholecystitis.There is a potential 7 mm stone in the distal common bile duct. There is no significant intrahepatic biliary ductal dilatation.  IV hydration was provided, patient was started on empiric IV ceftriaxone and IV Zosyn.  IV D50 was given due to hypoglycemia.  ED PA consulted with general surgery (Dr. Arnoldo Morale) and will see patient in the morning, GI consult was recommended.  Review of Systems: Constitutional: Positive for chills and loss of appetite.   Negative for fever.  HENT: Negative for ear pain and sore throat.   Eyes: Negative for pain and visual disturbance.  Respiratory: Positive for cough, negative for shortness of breath.   Cardiovascular: Negative for chest pain and palpitations.  Gastrointestinal: Positive for nausea, abdominal pain  Endocrine: Negative for polyphagia and polyuria.  Genitourinary: Positive for decreased urination.  Negative for dysuria, enuresis Musculoskeletal: Negative for arthralgias and back pain.  Skin: Negative for color change and rash.  Allergic/Immunologic: Negative for immunocompromised state.  Neurological: Positive for weakness.  Negative for tremors, syncope, speech difficulty  Hematological: Does not bruise/bleed easily.  All other systems reviewed and are negative   Past Medical History:  Diagnosis Date  . Anemia   . Anxiety   . Arthritis   . CHF (congestive heart failure) ([place])   . Chronic kidney disease    Renal Insufficiency Syndrome; Glomerulosclerosis 2013  . Depressed   . Diabetes mellitus without complication ([place])   . GERD (gastroesophageal reflux disease)   . High cholesterol   . HIV (human immunodeficiency virus infection) ([place])   . Hypertension   . 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 Paranoid disorder ([place])   . Pneumonia   . Schizophrenia, paranoid ([place])   . Sleep apnea   . TIA (transient ischemic attack)    Past Surgical History:  Procedure Laterality Date  . CARDIAC CATHETERIZATION    . COLONOSCOPY WITH PROPOFOL N/A 09/03/2015   Procedure: COLONOSCOPY WITH PROPOFOL;  Surgeon: Lollie Sails, MD;  Location: [facility] ENDOSCOPY;  Service: Endoscopy;  Laterality: N/A;  . DG TEETH FULL    . ESOPHAGOGASTRODUODENOSCOPY (EGD) WITH PROPOFOL N/A 07/01/2016   Procedure: ESOPHAGOGASTRODUODENOSCOPY (EGD) WITH  PROPOFOL;  Surgeon: Christena Deem, MD;  Location: [facility] ENDOSCOPY;  Service: Endoscopy;  Laterality: N/A;    Social History:  reports that he has never smoked. He has never used  smokeless tobacco. He reports that he does not drink alcohol and does not use drugs.   Allergies  Allergen Reactions  . Trazodone And Nefazodone     Affects other meds that they dont work  . Trazodone Hcl Other (See Comments)  . Viread [Tenofovir Disoproxil]     Damages kidneys  . Etodolac Rash    Family History  Problem Relation Age of Onset  . Heart attack Mother   . Diabetes Mellitus II Mother   . Mental illness Mother   . CAD Mother   . Heart attack Father   . CAD Father   . Hypertension Father      Prior to Admission medications   Medication Sig Start Date End Date Taking? Authorizing Provider  abacavir-dolutegravir-lamiVUDine (TRIUMEQ) 600-50-300 MG tablet Take 1 tablet by mouth at bedtime. 07/21/19   Lynn Ito, MD  albuterol (PROVENTIL HFA;VENTOLIN HFA) 108 (90 Base) MCG/ACT inhaler Inhale 2 puffs into the lungs every 6 (six) hours as needed for wheezing or shortness of breath. 04/14/18   Alford Highland, MD  aspirin EC 325 MG tablet Take 325 mg by mouth daily.    [provider]  butalbital-aspirin-caffeine Benny Lennert) 50-325-40 MG capsule Take 1-2 capsules by mouth every 6 (six) hours as needed for headache.     [provider]  cyanocobalamin 1000 MCG tablet Take 1,000 mcg by mouth daily.     [provider]  diazepam (VALIUM) 5 MG tablet Take 5 mg by mouth 2 (two) times daily as needed for anxiety.     [provider]  diphenhydrAMINE (BENADRYL) 25 mg capsule Take 25 mg by mouth at bedtime.    [provider]  divalproex (DEPAKOTE ER) 500 MG 24 hr tablet Take 500 mg by mouth at bedtime.     [provider]  docusate sodium (COLACE) 250 MG capsule Take 250 mg by mouth daily.    [provider]  donepezil (ARICEPT) 5 MG tablet Take 10 mg by mouth daily.  07/19/19   [provider]  FANAPT 12 MG TABS Take 12 mg by mouth 2 (two) times daily.  11/14/16   [provider]  ferrous  sulfate 325 (65 FE) MG tablet Take 325 mg by mouth daily with breakfast.    [provider]  fluticasone (FLOVENT HFA) 220 MCG/ACT inhaler Inhale 2 puffs into the lungs 2 (two) times daily. Rinse out mouth afterwards 04/14/18   Alford Highland, MD  gabapentin (NEURONTIN) 400 MG capsule Take 400 mg by mouth 3 (three) times daily.     [provider]  gemfibrozil (LOPID) 600 MG tablet Take 600 mg by mouth 2 (two) times daily before a meal.    [provider]  glimepiride (AMARYL) 4 MG tablet Take 4 mg by mouth daily with breakfast.  06/06/19   [provider]  glycopyrrolate (ROBINUL) 1 MG tablet Take 1 mg by mouth 2 (two) times daily.    [provider]  Insulin Aspart (NOVOLOG FLEXPEN [place]) Inject into the skin. Sliding scale    [provider]  Insulin Degludec (TRESIBA FLEXTOUCH) 200 UNIT/ML SOPN Inject 10 Units into the skin daily. Patient taking differently: Inject 50 Units into the skin daily. 08/19/19   Lynn Ito, MD  losartan (COZAAR) 25 MG tablet Take 1  tablet (25 mg total) by mouth daily. 05/04/18   Vaughan Basta, MD  memantine (NAMENDA) 10 MG tablet Take 10 mg by mouth 2 (two) times daily.    [provider]  metFORMIN (GLUCOPHAGE) 1000 MG tablet Take 1,000 mg by mouth 2 (two) times daily with a meal.    [provider]  metoprolol tartrate (LOPRESSOR) 25 MG tablet Take 0.5 tablets (12.5 mg total) by mouth 2 (two) times daily. 05/03/18   Vaughan Basta, MD  mirtazapine (REMERON) 30 MG tablet Take 30 mg by mouth at bedtime. 04/21/18   [provider]  mometasone (ELOCON) 0.1 % ointment Apply topically daily. Apply 4 drops to ear canal at bedtime as needed for itching and dry skin.    [provider]  Multiple Vitamin (MULTIVITAMIN) tablet Take 1 tablet by mouth daily.    [provider]  nitroGLYCERIN (NITROSTAT) 0.4 MG SL tablet Place 0.4 mg under the tongue every 5 (five)  minutes as needed for chest pain.     [provider]  Omega-3 Fatty Acids (FISH OIL) 1000 MG CAPS Take 1,000 mg by mouth daily.    [provider]  omeprazole (PRILOSEC) 40 MG capsule Take 40 mg by mouth 2 (two) times daily.     [provider]  OZEMPIC, 0.25 OR 0.5 MG/DOSE, 2 MG/1.5ML SOPN Inject 1 mg into the skin once a week.  06/06/19   [provider]  rosuvastatin (CRESTOR) 40 MG tablet Take 40 mg by mouth daily.     [provider]  senna (SENOKOT) 8.6 MG TABS tablet Take 1 tablet by mouth daily.     [provider]  sucralfate (CARAFATE) 1 g tablet Take 1 g by mouth 4 (four) times daily.  04/21/18   [provider]  tamsulosin (FLOMAX) 0.4 MG CAPS capsule Take 0.4 mg by mouth daily. 08/02/19   [provider]  triamcinolone cream (KENALOG) 0.1 % Apply 1 application topically 2 (two) times daily as needed. Patient taking differently: Apply 1 application topically 2 (two) times daily. 11/16/16   Little, Traci M, PA-C  venlafaxine XR (EFFEXOR-XR) 75 MG 24 hr capsule Take 1 capsule (75 mg total) by mouth daily with breakfast. 11/09/17   McNew, Tyson Babinski, MD  ziprasidone (GEODON) 20 MG capsule Take 20 mg by mouth daily.    [provider]    Physical Exam: BP 97/64   Pulse (!) 56   Temp 98.4 F (36.9 C) (Oral)   Resp 16   Ht 5\' 7"  (1.702 m)   Wt 97.1 kg   SpO2 95%   BMI 33.53 kg/m   . General: 60 y.o. year-old male well developed well nourished in no acute distress.  Alert and oriented x3. [name] HEENT: Dry mucous membrane.  NCAT, EOMI . Neck: Supple, trachea medial . Cardiovascular: Regular rate and rhythm with no rubs or gallops.  No thyromegaly or JVD noted. 2/4 pulses in all 4 extremities. [name] Respiratory: Clear to auscultation with no wheezes or rales. Good inspiratory effort. . Abdomen: Soft, tender to palpation of LLQ, RLQ without guarding, less tenderness at RUQ Normal bowel sounds x4  quadrants. . Muskuloskeletal: No cyanosis, clubbing or edema noted bilaterally . Neuro: CN II-XII intact, strength, sensation, reflexes intact . Skin: No ulcerative lesions noted or rashes . Psychiatry: Mood is appropriate for condition and setting          Labs on Admission:  Basic Metabolic Panel: Recent Labs  Lab 07/29/20 2032  NA 138  K 3.5  CL 106  CO2 20*  GLUCOSE 60*  BUN 24*  CREATININE 2.30*  CALCIUM 8.6*   Liver Function Tests: Recent Labs  Lab 07/29/20 2032  AST 15  ALT 17  ALKPHOS 68  BILITOT 0.4  PROT 6.8  ALBUMIN 3.5   No results for input(s): LIPASE, AMYLASE in the last 168 hours. No results for input(s): AMMONIA in the last 168 hours. CBC: Recent Labs  Lab 07/29/20 2032  WBC 8.6  NEUTROABS 3.8  HGB 11.8*  HCT 35.8*  MCV 97.8  PLT 192   Cardiac Enzymes: No results for input(s): CKTOTAL, CKMB, CKMBINDEX, TROPONINI in the last 168 hours.  BNP (last 3 results) Recent Labs    07/29/20 2032  BNP 31.0    ProBNP (last 3 results) No results for input(s): PROBNP in the last 8760 hours.  CBG: Recent Labs  Lab 07/29/20 2236 07/29/20 2320  GLUCAP 57* 113*    Radiological Exams on Admission: CT ABDOMEN PELVIS WO CONTRAST  Result Date: 07/29/2020 CLINICAL DATA:  Abdominal distension. EXAM: CT ABDOMEN AND PELVIS WITHOUT CONTRAST TECHNIQUE: Multidetector CT imaging of the abdomen and pelvis was performed following the standard protocol without IV contrast. COMPARISON:  CT dated 08/15/2019 FINDINGS: Lower chest: The lung bases are clear. The heart size is normal. Hepatobiliary: There appears to be heterogeneous hepatic steatosis. There is diffuse wall thickening of the gallbladder with adjacent fat stranding. There is a potential stone in the distal common bile duct measuring approximately 7 mm (axial series 3, image 37.There is no significant intrahepatic biliary ductal dilatation. Pancreas: Normal contours without ductal dilatation. No  peripancreatic fluid collection. Spleen: Unremarkable. Adrenals/Urinary Tract: --Adrenal glands: There is a stable left adrenal mass. --Right kidney/ureter: There is a nonobstructing stone in the upper pole the right kidney measuring approximately 1.1 cm. --Left kidney/ureter: No hydronephrosis or radiopaque kidney stones. --Urinary bladder: Unremarkable. Stomach/Bowel: --Stomach/Duodenum: No hiatal hernia or other gastric abnormality. Normal duodenal course and caliber. --Small bowel: Unremarkable. --Colon: Unremarkable. --Appendix: Normal. Vascular/Lymphatic: Atherosclerotic calcification is present within the non-aneurysmal abdominal aorta, without hemodynamically significant stenosis. --No retroperitoneal lymphadenopathy. --No mesenteric lymphadenopathy. --No pelvic or inguinal lymphadenopathy. Reproductive: Unremarkable Other: No ascites or free air. The abdominal wall is normal. Musculoskeletal. No acute displaced fractures. IMPRESSION: 1. Diffuse wall thickening of the gallbladder with adjacent fat stranding, concerning for acute cholecystitis. 2. There is a potential 7 mm stone in the distal common bile duct. There is no significant intrahepatic biliary ductal dilatation. 3. Heterogeneous hepatic steatosis. 4. Nonobstructing right nephrolithiasis. Aortic Atherosclerosis (ICD10-I70.0). Electronically Signed   By: Constance Holster M.D.   On: 07/29/2020 22:26   DG Chest 2 View  Result Date: 07/29/2020 CLINICAL DATA:  Cough. EXAM: CHEST - 2 VIEW COMPARISON:  08/14/2019 FINDINGS: Stable mildly enlarged cardiac silhouette. Clear lungs with normal vascularity. Thoracic spine degenerative changes. IMPRESSION: No acute abnormality. Electronically Signed   By: Claudie Revering M.D.   On: 07/29/2020 19:59    EKG: I independently viewed the EKG done and my findings are as followed: Normal sinus rhythm at rate of 61 bpm with first-degree AV block with PACs  Assessment/Plan Present on Admission: . Acute  cholecystitis . HIV positive ([place]) . AKI (acute kidney injury) ([place]) . HTN (hypertension) . Chronic diastolic heart failure ([facility])  Principal Problem:   Acute cholecystitis Active Problems:   Chronic diastolic heart failure ([facility])   HTN (hypertension)   Diabetes ([facility])   HIV positive ([facility])   AKI (acute  kidney injury) ([place])   Abdominal pain   Choledocholithiasis   Hypoglycemia   Dehydration   Hyperlipidemia   Abdominal pain possibly due to presumed acute cholecystitis R/O acute choledocholithiasis CT abdomen and pelvis was suggestive of acute cholecystitis and a 7 mm stone was noted in distal common bile duct.  Patient was empirically started on IV ceftriaxone and IV Zosyn, we shall empirically continue with IV Zosyn at this time. HIDA scan will be done in the morning Patient will be kept n.p.o. Continue IV hydration Continue IV morphine 2 mg every 4 hours as needed for moderate/severe pain General surgery (Dr. Arnoldo Morale) was already consulted by ED PA and will see patient in the morning Gastroenterology will be consulted for further evaluation and recommendation  Hypoglycemia Patient appears to be taking his insulin despite decreased food intake CBG was 60 on arrival, IV D50 was given with improvement in CBG to 113 Continue to monitor CBG with Accu-Cheks.  Dehydration/acute kidney injury BUN/creatinine 24/2.30 (baseline creatinine 1.0-1.3). Continue IV hydration Renally adjust medications, avoid nephrotoxic agents/dehydration/hypotension  Essential hypertension Hold BP meds at this time due to soft BP  Hyperlipidemia Patient is currently n.p.o., restart home meds when patient resumes oral intake  T2 DM Hold off on insulin at this time due to hypoglycemia Continue Accu-Cheks  HIV/Chronic diastolic heart failure Stable, patient is currently n.p.o. Continue home meds once patient resumes oral intake  DVT prophylaxis: SCDs  Code Status: Full code  Family  Communication: None at bedside  Disposition Plan:  Patient is from:                        home Anticipated DC to:                   SNF or family members home Anticipated DC date:               2-3 days Anticipated DC barriers:           Patient is unstable to be discharged at this time due to presumed cholecystitis and pending choledocholithiasis rule out with pending general surgery and GI consults   Consults called: GI, general surgery (by ED PA)  Admission status: Inpatient   Bernadette Hoit MD [facility]  07/30/2020, 2:51 AM

## 2020-07-29 NOTE — ED Triage Notes (Signed)
Pt. Arrived via EMS from a group home. EMS states pts. Blood pressure 58/40 at the group home. EMS administered IV NS bolus. Pt. Has received 300 mls. Pt states they have been drinking a bunch of fluids but have not been able to pee.

## 2020-07-30 ENCOUNTER — Inpatient Hospital Stay (HOSPITAL_COMMUNITY): Payer: Medicare Other

## 2020-07-30 ENCOUNTER — Encounter (HOSPITAL_COMMUNITY): Payer: Self-pay | Admitting: Internal Medicine

## 2020-07-30 DIAGNOSIS — K810 Acute cholecystitis: Secondary | ICD-10-CM

## 2020-07-30 DIAGNOSIS — I5032 Chronic diastolic (congestive) heart failure: Secondary | ICD-10-CM

## 2020-07-30 DIAGNOSIS — N179 Acute kidney failure, unspecified: Secondary | ICD-10-CM

## 2020-07-30 DIAGNOSIS — R109 Unspecified abdominal pain: Secondary | ICD-10-CM

## 2020-07-30 DIAGNOSIS — R1030 Lower abdominal pain, unspecified: Secondary | ICD-10-CM

## 2020-07-30 DIAGNOSIS — E860 Dehydration: Secondary | ICD-10-CM

## 2020-07-30 DIAGNOSIS — N1831 Chronic kidney disease, stage 3a: Secondary | ICD-10-CM

## 2020-07-30 DIAGNOSIS — K8050 Calculus of bile duct without cholangitis or cholecystitis without obstruction: Secondary | ICD-10-CM

## 2020-07-30 DIAGNOSIS — Z21 Asymptomatic human immunodeficiency virus [HIV] infection status: Secondary | ICD-10-CM

## 2020-07-30 DIAGNOSIS — F251 Schizoaffective disorder, depressive type: Secondary | ICD-10-CM

## 2020-07-30 DIAGNOSIS — E162 Hypoglycemia, unspecified: Secondary | ICD-10-CM

## 2020-07-30 DIAGNOSIS — E785 Hyperlipidemia, unspecified: Secondary | ICD-10-CM

## 2020-07-30 LAB — CBG MONITORING, ED
Glucose-Capillary: 107 mg/dL — ABNORMAL HIGH (ref 70–99)
Glucose-Capillary: 54 mg/dL — ABNORMAL LOW (ref 70–99)

## 2020-07-30 LAB — COMPREHENSIVE METABOLIC PANEL
ALT: 16 U/L (ref 0–44)
AST: 12 U/L — ABNORMAL LOW (ref 15–41)
Albumin: 3.1 g/dL — ABNORMAL LOW (ref 3.5–5.0)
Alkaline Phosphatase: 60 U/L (ref 38–126)
Anion gap: 8 (ref 5–15)
BUN: 24 mg/dL — ABNORMAL HIGH (ref 8–23)
CO2: 22 mmol/L (ref 22–32)
Calcium: 8.1 mg/dL — ABNORMAL LOW (ref 8.9–10.3)
Chloride: 108 mmol/L (ref 98–111)
Creatinine, Ser: 1.85 mg/dL — ABNORMAL HIGH (ref 0.61–1.24)
GFR, Estimated: 41 mL/min — ABNORMAL LOW (ref >60)
Glucose, Bld: 82 mg/dL (ref 70–99)
Potassium: 3.4 mmol/L — ABNORMAL LOW (ref 3.5–5.1)
Sodium: 138 mmol/L (ref 135–145)
Total Bilirubin: 0.5 mg/dL (ref 0.3–1.2)
Total Protein: 6.2 g/dL — ABNORMAL LOW (ref 6.5–8.1)

## 2020-07-30 LAB — CBC
HCT: 34.1 % — ABNORMAL LOW (ref 39.0–52.0)
Hemoglobin: 11.4 g/dL — ABNORMAL LOW (ref 13.0–17.0)
MCH: 32.7 pg (ref 26.0–34.0)
MCHC: 33.4 g/dL (ref 30.0–36.0)
MCV: 97.7 fL (ref 80.0–100.0)
Platelets: 166 10*3/uL (ref 150–400)
RBC: 3.49 MIL/uL — ABNORMAL LOW (ref 4.22–5.81)
RDW: 13.5 % (ref 11.5–15.5)
WBC: 6.6 10*3/uL (ref 4.0–10.5)
nRBC: 0 % (ref 0.0–0.2)

## 2020-07-30 LAB — URINALYSIS, ROUTINE W REFLEX MICROSCOPIC
Bilirubin Urine: NEGATIVE
Glucose, UA: NEGATIVE mg/dL
Hgb urine dipstick: NEGATIVE
Ketones, ur: NEGATIVE mg/dL
Leukocytes,Ua: NEGATIVE
Nitrite: NEGATIVE
Protein, ur: 100 mg/dL — AB
Specific Gravity, Urine: 1.012 (ref 1.005–1.030)
pH: 5 (ref 5.0–8.0)

## 2020-07-30 LAB — LIPASE, BLOOD: Lipase: 29 U/L (ref 11–51)

## 2020-07-30 LAB — PHOSPHORUS: Phosphorus: 3.7 mg/dL (ref 2.5–4.6)

## 2020-07-30 LAB — PROTIME-INR
INR: 1 (ref 0.8–1.2)
Prothrombin Time: 12.8 seconds (ref 11.4–15.2)

## 2020-07-30 LAB — APTT: aPTT: 30 seconds (ref 24–36)

## 2020-07-30 LAB — MAGNESIUM: Magnesium: 1.6 mg/dL — ABNORMAL LOW (ref 1.7–2.4)

## 2020-07-30 MED ORDER — BUTALBITAL-APAP-CAFFEINE 50-325-40 MG PO TABS
1.0000 | ORAL_TABLET | Freq: Four times a day (QID) | ORAL | Status: DC | PRN
  Filled 2020-07-30 (×2): qty 1

## 2020-07-30 MED ORDER — VITAMIN B-12 1000 MCG PO TABS
1000.0000 ug | ORAL_TABLET | Freq: Every day | ORAL | Status: DC
  Administered 2020-07-31: 1000 ug via ORAL
  Filled 2020-07-30 (×3): qty 1

## 2020-07-30 MED ORDER — PIPERACILLIN-TAZOBACTAM 3.375 G IVPB 30 MIN: 3.3750 g | Freq: Four times a day (QID) | INTRAVENOUS | Status: DC

## 2020-07-30 MED ORDER — PANTOPRAZOLE SODIUM 40 MG PO TBEC
40.0000 mg | DELAYED_RELEASE_TABLET | Freq: Two times a day (BID) | ORAL | Status: DC
  Administered 2020-07-30 – 2020-07-31 (×2): 40 mg via ORAL
  Filled 2020-07-30 (×5): qty 1

## 2020-07-30 MED ORDER — TAMSULOSIN HCL 0.4 MG PO CAPS
0.4000 mg | ORAL_CAPSULE | Freq: Every day | ORAL | Status: DC
Start: 2020-07-30 — End: 2020-07-31
  Administered 2020-07-30 – 2020-07-31 (×2): 0.4 mg via ORAL
  Filled 2020-07-30 (×4): qty 1

## 2020-07-30 MED ORDER — SUCRALFATE 1 G PO TABS
1.0000 g | ORAL_TABLET | Freq: Four times a day (QID) | ORAL | Status: DC
  Administered 2020-07-30 – 2020-07-31 (×4): 1 g via ORAL
  Filled 2020-07-30 (×9): qty 1

## 2020-07-30 MED ORDER — FLUTICASONE PROPIONATE HFA 220 MCG/ACT IN AERO: 2.0000 | INHALATION_SPRAY | Freq: Two times a day (BID) | RESPIRATORY_TRACT | Status: DC

## 2020-07-30 MED ORDER — MOMETASONE FUROATE 0.1 % EX OINT: TOPICAL_OINTMENT | Freq: Every day | CUTANEOUS | Status: DC

## 2020-07-30 MED ORDER — VENLAFAXINE HCL ER 75 MG PO CP24
75.0000 mg | ORAL_CAPSULE | Freq: Every day | ORAL | Status: DC
  Administered 2020-07-31: 75 mg via ORAL
  Filled 2020-07-30 (×3): qty 1

## 2020-07-30 MED ORDER — BUDESONIDE 0.5 MG/2ML IN SUSP
0.5000 mg | Freq: Two times a day (BID) | RESPIRATORY_TRACT | Status: DC
  Administered 2020-07-31: 0.5 mg via RESPIRATORY_TRACT
  Filled 2020-07-30 (×6): qty 2

## 2020-07-30 MED ORDER — TECHNETIUM TC 99M MEBROFENIN IV KIT
5.0000 | PACK | Freq: Once | INTRAVENOUS | Status: AC | PRN
  Administered 2020-07-30: 5.5 via INTRAVENOUS

## 2020-07-30 MED ORDER — DIAZEPAM 5 MG PO TABS
5.0000 mg | ORAL_TABLET | Freq: Two times a day (BID) | ORAL | Status: DC | PRN
  Administered 2020-07-30 – 2020-07-31 (×2): 5 mg via ORAL
  Filled 2020-07-30 (×2): qty 1

## 2020-07-30 MED ORDER — FERROUS SULFATE 325 (65 FE) MG PO TABS
325.0000 mg | ORAL_TABLET | Freq: Every day | ORAL | Status: DC
  Administered 2020-07-31: 325 mg via ORAL
  Filled 2020-07-30 (×3): qty 1

## 2020-07-30 MED ORDER — DONEPEZIL HCL 10 MG PO TABS
10.0000 mg | ORAL_TABLET | Freq: Every day | ORAL | Status: DC
  Administered 2020-07-30: 10 mg via ORAL
  Filled 2020-07-30 (×2): qty 2
  Filled 2020-07-30 (×2): qty 1

## 2020-07-30 MED ORDER — ALBUTEROL SULFATE (2.5 MG/3ML) 0.083% IN NEBU: 3.0000 mL | INHALATION_SOLUTION | Freq: Four times a day (QID) | RESPIRATORY_TRACT | Status: DC | PRN

## 2020-07-30 MED ORDER — SINCALIDE 5 MCG IJ SOLR
INTRAMUSCULAR | Status: AC
  Administered 2020-07-30: 1.89 ug via INTRAVENOUS
  Filled 2020-07-30: qty 5

## 2020-07-30 MED ORDER — SODIUM CHLORIDE 0.9 % IV SOLN: Freq: Once | INTRAVENOUS | Status: AC

## 2020-07-30 MED ORDER — MAGNESIUM SULFATE 2 GM/50ML IV SOLN
2.0000 g | Freq: Once | INTRAVENOUS | Status: AC
  Administered 2020-07-30: 2 g via INTRAVENOUS
  Filled 2020-07-30: qty 50

## 2020-07-30 MED ORDER — ZIPRASIDONE HCL 20 MG PO CAPS
20.0000 mg | ORAL_CAPSULE | Freq: Every day | ORAL | Status: DC
Start: 2020-07-30 — End: 2020-07-31
  Administered 2020-07-30 – 2020-07-31 (×2): 20 mg via ORAL
  Filled 2020-07-30 (×3): qty 1

## 2020-07-30 MED ORDER — POTASSIUM CHLORIDE IN NACL 20-0.9 MEQ/L-% IV SOLN
INTRAVENOUS | Status: DC
  Filled 2020-07-30 (×2): qty 1000

## 2020-07-30 MED ORDER — PIPERACILLIN-TAZOBACTAM 3.375 G IVPB
3.3750 g | Freq: Three times a day (TID) | INTRAVENOUS | Status: DC
  Administered 2020-07-30 – 2020-07-31 (×3): 3.375 g via INTRAVENOUS
  Filled 2020-07-30 (×10): qty 50

## 2020-07-30 MED ORDER — ROSUVASTATIN CALCIUM 40 MG PO TABS
40.0000 mg | ORAL_TABLET | Freq: Every day | ORAL | Status: DC
  Administered 2020-07-30: 40 mg via ORAL
  Filled 2020-07-30: qty 1
  Filled 2020-07-30: qty 2
  Filled 2020-07-30: qty 1

## 2020-07-30 MED ORDER — ABACAVIR-DOLUTEGRAVIR-LAMIVUD 600-50-300 MG PO TABS
1.0000 | ORAL_TABLET | Freq: Every day | ORAL | Status: DC
  Filled 2020-07-30 (×2): qty 1

## 2020-07-30 MED ORDER — SENNA 8.6 MG PO TABS
1.0000 | ORAL_TABLET | Freq: Every day | ORAL | Status: DC
  Administered 2020-07-30 – 2020-07-31 (×2): 8.6 mg via ORAL
  Filled 2020-07-30 (×3): qty 1

## 2020-07-30 MED ORDER — DIVALPROEX SODIUM ER 500 MG PO TB24
500.0000 mg | ORAL_TABLET | Freq: Every day | ORAL | Status: DC
  Administered 2020-07-30: 500 mg via ORAL
  Filled 2020-07-30 (×3): qty 1

## 2020-07-30 MED ORDER — DOCUSATE SODIUM 100 MG PO CAPS
200.0000 mg | ORAL_CAPSULE | Freq: Every day | ORAL | Status: DC
  Administered 2020-07-31: 200 mg via ORAL
  Filled 2020-07-30 (×2): qty 2

## 2020-07-30 MED ORDER — GABAPENTIN 300 MG PO CAPS
400.0000 mg | ORAL_CAPSULE | Freq: Three times a day (TID) | ORAL | Status: DC
  Administered 2020-07-30 (×2): 400 mg via ORAL
  Filled 2020-07-30 (×3): qty 1

## 2020-07-30 MED ORDER — POTASSIUM CHLORIDE 10 MEQ/100ML IV SOLN
10.0000 meq | INTRAVENOUS | Status: AC
  Administered 2020-07-30 (×2): 10 meq via INTRAVENOUS
  Filled 2020-07-30: qty 100

## 2020-07-30 MED ORDER — MEMANTINE HCL 10 MG PO TABS
10.0000 mg | ORAL_TABLET | Freq: Two times a day (BID) | ORAL | Status: DC
  Administered 2020-07-30 – 2020-07-31 (×2): 10 mg via ORAL
  Filled 2020-07-30 (×5): qty 1

## 2020-07-30 MED ORDER — DEXTROSE-NACL 5-0.9 % IV SOLN: INTRAVENOUS | Status: DC

## 2020-07-30 MED ORDER — MIRTAZAPINE 30 MG PO TABS
30.0000 mg | ORAL_TABLET | Freq: Every day | ORAL | Status: DC
  Administered 2020-07-30: 30 mg via ORAL
  Filled 2020-07-30 (×3): qty 1

## 2020-07-30 MED ORDER — ILOPERIDONE 4 MG PO TABS
12.0000 mg | ORAL_TABLET | Freq: Two times a day (BID) | ORAL | Status: DC
  Filled 2020-07-30 (×4): qty 3

## 2020-07-30 MED ORDER — ASPIRIN EC 325 MG PO TBEC
325.0000 mg | DELAYED_RELEASE_TABLET | Freq: Every day | ORAL | Status: DC
  Administered 2020-07-30 – 2020-07-31 (×2): 325 mg via ORAL
  Filled 2020-07-30 (×3): qty 1

## 2020-07-30 MED ORDER — DIPHENHYDRAMINE HCL 25 MG PO CAPS
25.0000 mg | ORAL_CAPSULE | Freq: Every day | ORAL | Status: DC
  Administered 2020-07-30: 25 mg via ORAL
  Filled 2020-07-30: qty 1

## 2020-07-30 MED ORDER — POTASSIUM CHLORIDE 10 MEQ/100ML IV SOLN: 10.0000 meq | INTRAVENOUS | Status: AC

## 2020-07-30 MED ORDER — MORPHINE SULFATE (PF) 2 MG/ML IV SOLN: 2.0000 mg | INTRAVENOUS | Status: DC | PRN

## 2020-07-30 MED ORDER — DOCUSATE SODIUM 50 MG PO CAPS: 250.0000 mg | ORAL_CAPSULE | Freq: Every day | ORAL | Status: DC

## 2020-07-30 MED ORDER — ADULT MULTIVITAMIN W/MINERALS CH
1.0000 | ORAL_TABLET | Freq: Every day | ORAL | Status: DC
  Administered 2020-07-30 – 2020-07-31 (×2): 1 via ORAL
  Filled 2020-07-30 (×3): qty 1

## 2020-07-30 MED ORDER — OMEGA-3-ACID ETHYL ESTERS 1 G PO CAPS
1.0000 g | ORAL_CAPSULE | Freq: Every day | ORAL | Status: DC
  Administered 2020-07-30 – 2020-07-31 (×2): 1 g via ORAL
  Filled 2020-07-30 (×3): qty 1

## 2020-07-30 MED ORDER — STERILE WATER FOR INJECTION IJ SOLN
INTRAMUSCULAR | Status: AC
  Administered 2020-07-30: 1.89 mL via INTRAVENOUS
  Filled 2020-07-30: qty 10

## 2020-07-30 NOTE — Progress Notes (Addendum)
PROGRESS NOTE  Danny Cook. BF:[id number] DOB: Jan 22, 1961 DOA: 07/29/2020 PCP: Ebbie Ridge, MD  Brief History:  60 year old male with a history of coronary artery disease, diabetes mellitus type 2, hypertension, hyperlipidemia, HIV, paranoid schizophrenia, diastolic CHF, cognitive impairment presenting with 2-day history of lower abdominal pain with nausea and subjective fevers and chills.  Patient had also complained of decreased oral intake and some dizziness.  He denies any chest pain, shortness breath, emesis, diarrhea, dysuria, hematuria.  He has not had any coughing or hemoptysis. In the emergency department, the patient was afebrile, but was noted to be hypotensive initially with a blood pressure of 82/63.  Oxygen saturation was 95% on room air.  BMP showed serum creatinine 2.30 with unremarkable LFTs.  WBC 8.6, hemoglobin 11.8, platelets 166,000.  UA was negative for pyuria.  Lactic acid peaked at 2.2.  CT of the abdomen and pelvis showed diffuse gallbladder wall thickening with adjacent fat stranding.  There was also a possible 7 mm common duct stone, but without any intrahepatic biliary ductal dilatation.  General surgery and GI were consulted to assist with management.  Assessment/Plan: Abdominal pain/gallbladder wall thickening -Concern for cholecystitis -1/30 CT abd--diffuse GB wall thickening with adj fat stranding, possible 8mm CBD stone, no ductal dilatation -Continue IV Zosyn pending culture data due to hypotension and elevated lactate -Continue IV fluids -General surgery consult -HIDA scan--neg, EF 92% -etiology abd pain unclear -GI consult -discussed with general surgery, Dr. Constance Haw  Abnormal CT abdomen pelvis -Initially concerning for choledocholithiasis with CT -RUQ US--no biliary ductal dilatation-mild GB wall thickenin -doubt choledocholithiasis with normal bili -GI consult  Acute on chronic renal failure--CKD stage IIIa -Baseline  creatinine 1.0-1.3 -Presented with serum creatinine 2.30 -Continue IV fluids  Coronary Artery Disease -no chest pain -personally reviewed EKG--sinus nonspecific T wave change -holding metoprolol due to soft BPs -continue ASA  Essential Hypertension -holding metoprolol and losartan due to soft BPs  Cognitive Impairment/Schizophrenia -continue namenda and aricept -continue depakote and valium  Diabetes Mellitus type 2 -check A1C -holding long acting insulin for now due to hypoglycemia -holding metformin and amaryl -novolog sliding scale  HIV -restart Triumeq -07/12/20 HIV RNA--undetectable -07/12/20 CD4--752/39%  Hyperlipidemia -continue statin  Chronic diastolic CHF -clinically euvolemic -07/25/20 Echo-EF 55-60%, no WMA, G1DD, trivial MR/AI   Hypokalemia/hypomagnesemia -replete      Status is: Inpatient  Remains inpatient appropriate because:IV treatments appropriate due to intensity of illness or inability to take PO   Dispo: The patient is from: Home              Anticipated d/c is to: Home              Anticipated d/c date is: 2 days              Patient currently is not medically stable to d/c.   Difficult to place patient No        Family Communication:  no Family at bedside  Consultants:  GI, general surgery  Code Status:  FULL  DVT Prophylaxis:  [place] Heparin    Procedures: As Listed in Progress Note Above  Antibiotics: Zosyn 1/30>>     Subjective:   Objective: Vitals:   07/30/20 0038 07/30/20 0243 07/30/20 0345 07/30/20 0515  BP: 94/62 97/64 106/65 106/64  Pulse: (!) 58 (!) 56 (!) 58 (!) 59  Resp: 16 16 16 16   Temp:      TempSrc:  SpO2: 95% 95% 95% 94%  Weight:      Height:        Intake/Output Summary (Last 24 hours) at 07/30/2020 0941 Last data filed at 07/30/2020 [id number] Gross per 24 hour  Intake 1150 ml  Output 800 ml  Net 350 ml   Weight change:  Exam:   General:  Pt is alert, follows commands appropriately,  not in acute distress  HEENT: No icterus, No thrush, No neck mass, [place]/AT  Cardiovascular: RRR, S1/S2, no rubs, no gallops  Respiratory: CTA bilaterally, no wheezing, no crackles, no rhonchi  Abdomen: Soft/+BS, non tender, non distended, no guarding  Extremities: No edema, No lymphangitis, No petechiae, No rashes, no synovitis   Data Reviewed: I have personally reviewed following labs and imaging studies Basic Metabolic Panel: Recent Labs  Lab 07/29/20 2032 07/30/20 0524  NA 138 138  K 3.5 3.4*  CL 106 108  CO2 20* 22  GLUCOSE 60* 82  BUN 24* 24*  CREATININE 2.30* 1.85*  CALCIUM 8.6* 8.1*  MG  --  1.6*  PHOS  --  3.7   Liver Function Tests: Recent Labs  Lab 07/29/20 2032 07/30/20 0524  AST 15 12*  ALT 17 16  ALKPHOS 68 60  BILITOT 0.4 0.5  PROT 6.8 6.2*  ALBUMIN 3.5 3.1*   No results for input(s): LIPASE, AMYLASE in the last 168 hours. No results for input(s): AMMONIA in the last 168 hours. Coagulation Profile: Recent Labs  Lab 07/30/20 0524  INR 1.0   CBC: Recent Labs  Lab 07/29/20 2032 07/30/20 0524  WBC 8.6 6.6  NEUTROABS 3.8  --   HGB 11.8* 11.4*  HCT 35.8* 34.1*  MCV 97.8 97.7  PLT 192 166   Cardiac Enzymes: No results for input(s): CKTOTAL, CKMB, CKMBINDEX, TROPONINI in the last 168 hours. BNP: Invalid input(s): POCBNP CBG: Recent Labs  Lab 07/29/20 2236 07/29/20 2320 07/30/20 0334 07/30/20 0354  GLUCAP 57* 113* 54* 107*   HbA1C: No results for input(s): HGBA1C in the last 72 hours. Urine analysis:    Component Value Date/Time   COLORURINE YELLOW 07/30/2020 0620   APPEARANCEUR CLEAR 07/30/2020 0620   APPEARANCEUR Clear 10/23/2014 1719   LABSPEC 1.012 07/30/2020 0620   LABSPEC 1.017 10/23/2014 1719   PHURINE 5.0 07/30/2020 0620   GLUCOSEU NEGATIVE 07/30/2020 0620   GLUCOSEU Negative 10/23/2014 1719   HGBUR NEGATIVE 07/30/2020 0620   BILIRUBINUR NEGATIVE 07/30/2020 0620   BILIRUBINUR Negative 10/23/2014 1719   KETONESUR  NEGATIVE 07/30/2020 0620   PROTEINUR 100 (A) 07/30/2020 0620   NITRITE NEGATIVE 07/30/2020 0620   LEUKOCYTESUR NEGATIVE 07/30/2020 0620   LEUKOCYTESUR Negative 10/23/2014 1719   Sepsis Labs: @LABRCNTIP (procalcitonin:4,lacticidven:4) ) Recent Results (from the past 240 hour(s))  SARS Coronavirus 2 by RT PCR (hospital order, performed in [facility] hospital lab) Nasopharyngeal Nasopharyngeal Swab     Status: None   Collection Time: 07/29/20  7:30 PM   Specimen: Nasopharyngeal Swab  Result Value Ref Range Status   SARS Coronavirus 2 NEGATIVE NEGATIVE Final    Comment: (NOTE) SARS-CoV-2 target nucleic acids are NOT DETECTED.  The SARS-CoV-2 RNA is generally detectable in upper and lower respiratory specimens during the acute phase of infection. The lowest concentration of SARS-CoV-2 viral copies this assay can detect is 250 copies / mL. A negative result does not preclude SARS-CoV-2 infection and should not be used as the sole basis for treatment or other patient management decisions.  A negative result may occur with improper specimen collection /  handling, submission of specimen other than nasopharyngeal swab, presence of viral mutation(s) within the areas targeted by this assay, and inadequate number of viral copies (<250 copies / mL). A negative result must be combined with clinical observations, patient history, and epidemiological information.  Fact Sheet for Patients:   StrictlyIdeas.no  Fact Sheet for Healthcare Providers: BankingDealers.co.za  This test is not yet approved or  cleared by the [place] FDA and has been authorized for detection and/or diagnosis of SARS-CoV-2 by FDA under an Emergency Use Authorization (EUA).  This EUA will remain in effect (meaning this test can be used) for the duration of the COVID-19 declaration under Section 564(b)(1) of the Act, 21 U.S.C. section 360bbb-3(b)(1), unless the  authorization is terminated or revoked sooner.  Performed at [facility], [address] [place], [place] [address]   Blood culture (routine x 2)     Status: None (Preliminary result)   Collection Time: 07/29/20  8:15 PM   Specimen: BLOOD  Result Value Ref Range Status   Specimen Description BLOOD BLOOD LEFT WRIST  Final   Special Requests   Final    Blood Culture adequate volume BOTTLES DRAWN AEROBIC AND ANAEROBIC   Culture   Final    NO GROWTH < 12 HOURS Performed at [facility], [address], [place], [place] [address]    Report Status PENDING  Incomplete  Blood culture (routine x 2)     Status: None (Preliminary result)   Collection Time: 07/29/20  8:15 PM   Specimen: BLOOD  Result Value Ref Range Status   Specimen Description BLOOD BLOOD LEFT HAND  Final   Special Requests   Final    Blood Culture adequate volume BOTTLES DRAWN AEROBIC AND ANAEROBIC   Culture   Final    NO GROWTH < 12 HOURS Performed at [facility], [address], [place], [place] [address]    Report Status PENDING  Incomplete     Scheduled Meds: Continuous Infusions: . dextrose 5 % and 0.9% NaCl 50 mL/hr at 07/30/20 0353  . magnesium sulfate bolus IVPB    . piperacillin-tazobactam (ZOSYN)  IV 3.375 g (07/30/20 0519)  . potassium chloride      Procedures/Studies: CT ABDOMEN PELVIS WO CONTRAST  Result Date: 07/29/2020 CLINICAL DATA:  Abdominal distension. EXAM: CT ABDOMEN AND PELVIS WITHOUT CONTRAST TECHNIQUE: Multidetector CT imaging of the abdomen and pelvis was performed following the standard protocol without IV contrast. COMPARISON:  CT dated 08/15/2019 FINDINGS: Lower chest: The lung bases are clear. The heart size is normal. Hepatobiliary: There appears to be heterogeneous hepatic steatosis. There is diffuse wall thickening of the gallbladder with adjacent fat stranding. There is a potential stone in the distal common bile duct measuring approximately 7 mm (axial series 3, image 37.There is no  significant intrahepatic biliary ductal dilatation. Pancreas: Normal contours without ductal dilatation. No peripancreatic fluid collection. Spleen: Unremarkable. Adrenals/Urinary Tract: --Adrenal glands: There is a stable left adrenal mass. --Right kidney/ureter: There is a nonobstructing stone in the upper pole the right kidney measuring approximately 1.1 cm. --Left kidney/ureter: No hydronephrosis or radiopaque kidney stones. --Urinary bladder: Unremarkable. Stomach/Bowel: --Stomach/Duodenum: No hiatal hernia or other gastric abnormality. Normal duodenal course and caliber. --Small bowel: Unremarkable. --Colon: Unremarkable. --Appendix: Normal. Vascular/Lymphatic: Atherosclerotic calcification is present within the non-aneurysmal abdominal aorta, without hemodynamically significant stenosis. --No retroperitoneal lymphadenopathy. --No mesenteric lymphadenopathy. --No pelvic or inguinal lymphadenopathy. Reproductive: Unremarkable Other: No ascites or free air. The abdominal wall is normal. Musculoskeletal. No acute displaced fractures.  IMPRESSION: 1. Diffuse wall thickening of the gallbladder with adjacent fat stranding, concerning for acute cholecystitis. 2. There is a potential 7 mm stone in the distal common bile duct. There is no significant intrahepatic biliary ductal dilatation. 3. Heterogeneous hepatic steatosis. 4. Nonobstructing right nephrolithiasis. Aortic Atherosclerosis (ICD10-I70.0). Electronically Signed   By: Constance Holster M.D.   On: 07/29/2020 22:26   DG Chest 2 View  Result Date: 07/29/2020 CLINICAL DATA:  Cough. EXAM: CHEST - 2 VIEW COMPARISON:  08/14/2019 FINDINGS: Stable mildly enlarged cardiac silhouette. Clear lungs with normal vascularity. Thoracic spine degenerative changes. IMPRESSION: No acute abnormality. Electronically Signed   By: Claudie Revering M.D.   On: 07/29/2020 19:59   ECHOCARDIOGRAM COMPLETE  Result Date: 07/25/2020    ECHOCARDIOGRAM REPORT   Patient Name:   Danny Cook. Date of Exam: 07/25/2020 Medical Rec #:  [id number]                 Height:       67.0 in Accession #:    [id number]                Weight:       214.0 lb Date of Birth:  1960/08/31                 BSA:          2.081 m Patient Age:    42 years                  BP:           118/75 mmHg Patient Gender: M                         HR:           74 bpm. Exam Location:  [facility] Procedure: 2D Echo, Color Doppler, Cardiac Doppler and Strain Analysis Indications:     I50.9 Congestive Heart Failure  History:         Patient has prior history of Echocardiogram examinations. TIA                  and CKD; Risk Factors:Sleep Apnea, Hypertension and Diabetes.  Sonographer:     Charmayne Sheer RDCS (AE) Referring Phys:  [id number] Alisa Graff Diagnosing Phys: Bartholome Bill MD  Sonographer Comments: Image acquisition challenging due to patient body habitus. Global longitudinal strain was attempted. IMPRESSIONS  1. Left ventricular ejection fraction, by estimation, is 55 to 60%. The left ventricle has normal function. The left ventricle has no regional wall motion abnormalities. There is mild left ventricular hypertrophy. Left ventricular diastolic parameters are consistent with Grade I diastolic dysfunction (impaired relaxation).  2. Right ventricular systolic function is normal. The right ventricular size is normal.  3. The mitral valve is grossly normal. Trivial mitral valve regurgitation.  4. The aortic valve is grossly normal. Aortic valve regurgitation is trivial. No aortic stenosis is present.  5. Aortic dilatation noted. There is borderline dilatation of the aortic root, measuring 36 mm. FINDINGS  Left Ventricle: Left ventricular ejection fraction, by estimation, is 55 to 60%. The left ventricle has normal function. The left ventricle has no regional wall motion abnormalities. The left ventricular internal cavity size was normal in size. There is  mild left ventricular hypertrophy. Left ventricular diastolic  parameters are consistent with Grade I diastolic dysfunction (impaired relaxation). Right Ventricle: The right ventricular size is normal. No increase in right ventricular  wall thickness. Right ventricular systolic function is normal. Left Atrium: Left atrial size was normal in size. Right Atrium: Right atrial size was normal in size. Pericardium: There is no evidence of pericardial effusion. Mitral Valve: The mitral valve is grossly normal. Trivial mitral valve regurgitation. MV peak gradient, 2.4 mmHg. The mean mitral valve gradient is 1.0 mmHg. Tricuspid Valve: The tricuspid valve is grossly normal. Tricuspid valve regurgitation is trivial. Aortic Valve: The aortic valve is grossly normal. Aortic valve regurgitation is trivial. No aortic stenosis is present. Aortic valve mean gradient measures 5.0 mmHg. Aortic valve peak gradient measures 8.3 mmHg. Aortic valve area, by VTI measures 3.05 cm. Pulmonic Valve: The pulmonic valve was not well visualized. Pulmonic valve regurgitation is not visualized. Aorta: Aortic dilatation noted. There is borderline dilatation of the aortic root, measuring 36 mm. IAS/Shunts: No atrial level shunt detected by color flow Doppler.  LEFT VENTRICLE PLAX 2D LVIDd:         3.70 cm  Diastology LVIDs:         2.60 cm  LV e' medial:    6.09 cm/s LV PW:         1.70 cm  LV E/e' medial:  10.4 LV IVS:        1.10 cm  LV e' lateral:   11.60 cm/s LVOT diam:     2.20 cm  LV E/e' lateral: 5.5 LV SV:         83 LV SV Index:   40 LVOT Area:     3.80 cm  RIGHT VENTRICLE RV Basal diam:  2.80 cm LEFT ATRIUM             Index       RIGHT ATRIUM           Index LA diam:        3.60 cm 1.73 cm/m  RA Area:     10.40 cm LA Vol (A2C):   39.8 ml 19.12 ml/m RA Volume:   18.30 ml  8.79 ml/m LA Vol (A4C):   30.0 ml 14.41 ml/m LA Biplane Vol: 35.4 ml 17.01 ml/m  AORTIC VALVE                    PULMONIC VALVE AV Area (Vmax):    3.06 cm     PV Vmax:       1.32 m/s AV Area (Vmean):   2.84 cm     PV Vmean:       82.900 cm/s AV Area (VTI):     3.05 cm     PV VTI:        0.186 m AV Vmax:           144.00 cm/s  PV Peak grad:  7.0 mmHg AV Vmean:          104.000 cm/s PV Mean grad:  3.0 mmHg AV VTI:            0.273 m AV Peak Grad:      8.3 mmHg AV Mean Grad:      5.0 mmHg LVOT Vmax:         116.00 cm/s LVOT Vmean:        77.700 cm/s LVOT VTI:          0.219 m LVOT/AV VTI ratio: 0.80  AORTA Ao Root diam: 3.60 cm MITRAL VALVE MV Area (PHT): 3.12 cm    SHUNTS MV Area VTI:   3.51 cm    Systemic VTI:  0.22 m MV Peak grad:  2.4 mmHg    Systemic Diam: 2.20 cm MV Mean grad:  1.0 mmHg MV Vmax:       0.77 m/s MV Vmean:      48.0 cm/s MV Decel Time: 243 msec MV E velocity: 63.40 cm/s MV A velocity: 56.10 cm/s MV E/A ratio:  1.13 Bartholome Bill MD Electronically signed by Bartholome Bill MD Signature Date/Time: 07/25/2020/4:59:06 PM    Final     Orson Eva, DO  [facility]  If 7PM-7AM, please contact night-coverage [email or web address] Password [email or web address] 07/30/2020, 9:41 AM   LOS: 1 day

## 2020-07-30 NOTE — Consult Note (Signed)
[facility] Consult  Reason for Consult: ? Possible cholecystitis  Referring Physician:  Dr. Carles Collet   Chief Complaint    Hypotension      HPI: Danny Cook. is a 60 y.o. male who lives at group home and has CHF, CKD, HIV, HTN, DM and reports that on Saturday he had some nausea and some chills and weakness. No known fevers and no reported abdominal pain. He says he eats normally and has not had any pain with eating. He had a CT done in the ED due to some lower abdominal pain and was found to have a gallbladder with possible wall thickening. He was admitted to the hospitalist service for further workup regarding cholecystitis and possblye choledocholithiasis on the CT. He had normal leukocytosis and LFTs. He has no complaints of urinary symptoms. He said he had an occasional minor cough.   He is hungry.  He reports some reflux controlled with medication.   Past Medical History:  Diagnosis Date  . Anemia   . Anxiety   . Arthritis   . CHF (congestive heart failure) ([place])   . Chronic kidney disease    Renal Insufficiency Syndrome; Glomerulosclerosis 2013  . Depressed   . Diabetes mellitus without complication ([place])   . GERD (gastroesophageal reflux disease)   . High cholesterol   . HIV (human immunodeficiency virus infection) ([place])   . Hypertension   . 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 Paranoid disorder ([place])   . Pneumonia   . Schizophrenia, paranoid ([place])   . Sleep apnea   . TIA (transient ischemic attack)     Past Surgical History:  Procedure Laterality Date  . CARDIAC CATHETERIZATION    . COLONOSCOPY WITH PROPOFOL N/A 09/03/2015   Procedure: COLONOSCOPY WITH PROPOFOL;  Surgeon: Lollie Sails, MD;  Location: [facility] ENDOSCOPY;  Service: Endoscopy;  Laterality: N/A;  . DG TEETH FULL    . ESOPHAGOGASTRODUODENOSCOPY (EGD) WITH PROPOFOL N/A 07/01/2016   Procedure: ESOPHAGOGASTRODUODENOSCOPY (EGD) WITH PROPOFOL;  Surgeon: Lollie Sails, MD;  Location: [facility]  ENDOSCOPY;  Service: Endoscopy;  Laterality: N/A;    Family History  Problem Relation Age of Onset  . Heart attack Mother   . Diabetes Mellitus II Mother   . Mental illness Mother   . CAD Mother   . Heart attack Father   . CAD Father   . Hypertension Father     Social History   Tobacco Use  . Smoking status: Never Smoker  . Smokeless tobacco: Never Used  Vaping Use  . Vaping Use: Never used  Substance Use Topics  . Alcohol use: No  . Drug use: No    Medications: I have reviewed the patient's current medications. Current Facility-Administered Medications  Medication Dose Route Frequency Provider Last Rate Last Admin  . 0.9 % NaCl with KCl 20 mEq/ L  infusion   Intravenous Continuous Tat, Shanon Brow, MD 75 mL/hr at 07/30/20 1145 New Bag at 07/30/20 1145  . morphine 2 MG/ML injection 2 mg  2 mg Intravenous Q4H PRN Adefeso, Oladapo, DO      . piperacillin-tazobactam (ZOSYN) IVPB 3.375 g  3.375 g Intravenous Q8H Adefeso, Oladapo, DO   Stopped at 07/30/20 1108   Current Outpatient Medications  Medication Sig Dispense Refill Last Dose  . abacavir-dolutegravir-lamiVUDine (TRIUMEQ) 600-50-300 MG tablet Take 1 tablet by mouth at bedtime. 30 tablet 6   . albuterol (PROVENTIL HFA;VENTOLIN HFA) 108 (90 Base) MCG/ACT inhaler Inhale 2 puffs into the lungs every 6 (six)  hours as needed for wheezing or shortness of breath. 1 Inhaler 2   . aspirin EC 325 MG tablet Take 325 mg by mouth daily.     . butalbital-aspirin-caffeine (FIORINAL) 50-325-40 MG capsule Take 1-2 capsules by mouth every 6 (six) hours as needed for headache.      . cyanocobalamin 1000 MCG tablet Take 1,000 mcg by mouth daily.      . diazepam (VALIUM) 5 MG tablet Take 5 mg by mouth 2 (two) times daily as needed for anxiety.      . diphenhydrAMINE (BENADRYL) 25 mg capsule Take 25 mg by mouth at bedtime.     . divalproex (DEPAKOTE ER) 500 MG 24 hr tablet Take 500 mg by mouth at bedtime.      . docusate sodium (COLACE) 250 MG  capsule Take 250 mg by mouth daily.     [name] donepezil (ARICEPT) 5 MG tablet Take 10 mg by mouth daily.      [name] FANAPT 12 MG TABS Take 12 mg by mouth 2 (two) times daily.      . ferrous sulfate 325 (65 FE) MG tablet Take 325 mg by mouth daily with breakfast.     . fluticasone (FLOVENT HFA) 220 MCG/ACT inhaler Inhale 2 puffs into the lungs 2 (two) times daily. Rinse out mouth afterwards 1 Inhaler 12   . gabapentin (NEURONTIN) 400 MG capsule Take 400 mg by mouth 3 (three) times daily.      [name] gemfibrozil (LOPID) 600 MG tablet Take 600 mg by mouth 2 (two) times daily before a meal.     . glimepiride (AMARYL) 4 MG tablet Take 4 mg by mouth daily with breakfast.      . glycopyrrolate (ROBINUL) 1 MG tablet Take 1 mg by mouth 2 (two) times daily.     . Insulin Aspart (NOVOLOG FLEXPEN [place]) Inject into the skin. Sliding scale     . Insulin Degludec (TRESIBA FLEXTOUCH) 200 UNIT/ML SOPN Inject 10 Units into the skin daily. (Patient taking differently: Inject 50 Units into the skin daily.)     . losartan (COZAAR) 25 MG tablet Take 1 tablet (25 mg total) by mouth daily. 30 tablet 0   . memantine (NAMENDA) 10 MG tablet Take 10 mg by mouth 2 (two) times daily.     . metFORMIN (GLUCOPHAGE) 1000 MG tablet Take 1,000 mg by mouth 2 (two) times daily with a meal.     . metoprolol tartrate (LOPRESSOR) 25 MG tablet Take 0.5 tablets (12.5 mg total) by mouth 2 (two) times daily. 30 tablet 0   . mirtazapine (REMERON) 30 MG tablet Take 30 mg by mouth at bedtime.     . mometasone (ELOCON) 0.1 % ointment Apply topically daily. Apply 4 drops to ear canal at bedtime as needed for itching and dry skin.     . Multiple Vitamin (MULTIVITAMIN) tablet Take 1 tablet by mouth daily.     . nitroGLYCERIN (NITROSTAT) 0.4 MG SL tablet Place 0.4 mg under the tongue every 5 (five) minutes as needed for chest pain.      . Omega-3 Fatty Acids (FISH OIL) 1000 MG CAPS Take 1,000 mg by mouth daily.     [name] omeprazole (PRILOSEC) 40 MG capsule Take 40 mg  by mouth 2 (two) times daily.      [name] OZEMPIC, 0.25 OR 0.5 MG/DOSE, 2 MG/1.5ML SOPN Inject 1 mg into the skin once a week.      . rosuvastatin (CRESTOR) 40 MG tablet Take 40  mg by mouth daily.      [name] senna (SENOKOT) 8.6 MG TABS tablet Take 1 tablet by mouth daily.      . sucralfate (CARAFATE) 1 g tablet Take 1 g by mouth 4 (four) times daily.      . tamsulosin (FLOMAX) 0.4 MG CAPS capsule Take 0.4 mg by mouth daily.     [name] triamcinolone cream (KENALOG) 0.1 % Apply 1 application topically 2 (two) times daily as needed. (Patient taking differently: Apply 1 application topically 2 (two) times daily.) 30 g 0   . venlafaxine XR (EFFEXOR-XR) 75 MG 24 hr capsule Take 1 capsule (75 mg total) by mouth daily with breakfast. 30 capsule 0   . ziprasidone (GEODON) 20 MG capsule Take 20 mg by mouth daily.      Allergies  Allergen Reactions  . Trazodone And Nefazodone     Affects other meds that they dont work  . Trazodone Hcl Other (See Comments)  . Viread [Tenofovir Disoproxil]     Damages kidneys  . Etodolac Rash     ROS:  A comprehensive review of systems was negative except for: Constitutional: positive for chills and weakness Gastrointestinal: positive for nausea and reflux symptoms  Blood pressure 137/79, pulse 61, temperature 98.4 F (36.9 C), temperature source Oral, resp. rate 15, height 5\' 7"  (1.702 m), weight 97.1 kg, SpO2 96 %. Physical Exam Vitals reviewed.  HENT:     Head: Normocephalic.     Nose: Nose normal.  Eyes:     Extraocular Movements: Extraocular movements intact.  Cardiovascular:     Rate and Rhythm: Normal rate.  Pulmonary:     Effort: Pulmonary effort is normal.  Abdominal:     General: There is no distension.     Palpations: Abdomen is soft.     Tenderness: There is abdominal tenderness in the suprapubic area.     Comments: Minor suprapubic tenderness, no RUQ tenderness   Musculoskeletal:        General: No swelling.  Skin:    General: Skin is warm.   Neurological:     General: No focal deficit present.     Mental Status: He is alert and oriented to person, place, and time.  Psychiatric:        Mood and Affect: Mood normal.        Behavior: Behavior normal.        Thought Content: Thought content normal.        Judgment: Judgment normal.     Results: Results for orders placed or performed during the hospital encounter of 07/29/20 (from the past 48 hour(s))  SARS Coronavirus 2 by RT PCR (hospital order, performed in [facility] hospital lab) Nasopharyngeal Nasopharyngeal Swab     Status: None   Collection Time: 07/29/20  7:30 PM   Specimen: Nasopharyngeal Swab  Result Value Ref Range   SARS Coronavirus 2 NEGATIVE NEGATIVE    Comment: (NOTE) SARS-CoV-2 target nucleic acids are NOT DETECTED.  The SARS-CoV-2 RNA is generally detectable in upper and lower respiratory specimens during the acute phase of infection. The lowest concentration of SARS-CoV-2 viral copies this assay can detect is 250 copies / mL. A negative result does not preclude SARS-CoV-2 infection and should not be used as the sole basis for treatment or other patient management decisions.  A negative result may occur with improper specimen collection / handling, submission of specimen other than nasopharyngeal swab, presence of viral mutation(s) within the areas targeted by this assay, and  inadequate number of viral copies (<250 copies / mL). A negative result must be combined with clinical observations, patient history, and epidemiological information.  Fact Sheet for Patients:   StrictlyIdeas.no  Fact Sheet for Healthcare Providers: BankingDealers.co.za  This test is not yet approved or  cleared by the [place] FDA and has been authorized for detection and/or diagnosis of SARS-CoV-2 by FDA under an Emergency Use Authorization (EUA).  This EUA will remain in effect (meaning this test can be used) for the  duration of the COVID-19 declaration under Section 564(b)(1) of the Act, 21 U.S.C. section 360bbb-3(b)(1), unless the authorization is terminated or revoked sooner.  Performed at [facility], [address], [place], [place] [address]   Blood culture (routine x 2)     Status: None (Preliminary result)   Collection Time: 07/29/20  8:15 PM   Specimen: BLOOD  Result Value Ref Range   Specimen Description BLOOD BLOOD LEFT WRIST    Special Requests      Blood Culture adequate volume BOTTLES DRAWN AEROBIC AND ANAEROBIC   Culture      NO GROWTH < 12 HOURS Performed at [facility], [address] [place], [place] [address]    Report Status PENDING   Blood culture (routine x 2)     Status: None (Preliminary result)   Collection Time: 07/29/20  8:15 PM   Specimen: BLOOD  Result Value Ref Range   Specimen Description BLOOD BLOOD LEFT HAND    Special Requests      Blood Culture adequate volume BOTTLES DRAWN AEROBIC AND ANAEROBIC   Culture      NO GROWTH < 12 HOURS Performed at [facility], [address] [place], [place] [address]    Report Status PENDING   Comprehensive metabolic panel     Status: Abnormal   Collection Time: 07/29/20  8:32 PM  Result Value Ref Range   Sodium 138 135 - 145 mmol/L   Potassium 3.5 3.5 - 5.1 mmol/L   Chloride 106 98 - 111 mmol/L   CO2 20 (L) 22 - 32 mmol/L   Glucose, Bld 60 (L) 70 - 99 mg/dL    Comment: Glucose reference range applies only to samples taken after fasting for at least 8 hours.   BUN 24 (H) 8 - 23 mg/dL   Creatinine, Ser 2.30 (H) 0.61 - 1.24 mg/dL   Calcium 8.6 (L) 8.9 - 10.3 mg/dL   Total Protein 6.8 6.5 - 8.1 g/dL   Albumin 3.5 3.5 - 5.0 g/dL   AST 15 15 - 41 U/L   ALT 17 0 - 44 U/L   Alkaline Phosphatase 68 38 - 126 U/L   Total Bilirubin 0.4 0.3 - 1.2 mg/dL   GFR, Estimated 32 (L) >60 mL/min    Comment: (NOTE) Calculated using the CKD-EPI Creatinine Equation (2021)    Anion gap 12 5 - 15    Comment: Performed at [facility], [address], [place], [place] [address]  CBC with Differential     Status: Abnormal   Collection Time: 07/29/20  8:32 PM  Result Value Ref Range   WBC 8.6 4.0 - 10.5 K/uL   RBC 3.66 (L) 4.22 - 5.81 MIL/uL   Hemoglobin 11.8 (L) 13.0 - 17.0 g/dL   HCT 35.8 (L) 39.0 - 52.0 %   MCV 97.8 80.0 - 100.0 fL   MCH 32.2 26.0 - 34.0 pg   MCHC 33.0 30.0 - 36.0 g/dL   RDW 13.4 11.5 - 15.5 %  Platelets 192 150 - 400 K/uL   nRBC 0.0 0.0 - 0.2 %   Neutrophils Relative % 44 %   Neutro Abs 3.8 1.7 - 7.7 K/uL   Lymphocytes Relative 41 %   Lymphs Abs 3.5 0.7 - 4.0 K/uL   Monocytes Relative 10 %   Monocytes Absolute 0.8 0.1 - 1.0 K/uL   Eosinophils Relative 4 %   Eosinophils Absolute 0.4 0.0 - 0.5 K/uL   Basophils Relative 1 %   Basophils Absolute 0.1 0.0 - 0.1 K/uL   Immature Granulocytes 0 %   Abs Immature Granulocytes 0.01 0.00 - 0.07 K/uL    Comment: Performed at [facility], [address] [place], [place] [address]  Troponin I (High Sensitivity)     Status: None   Collection Time: 07/29/20  8:32 PM  Result Value Ref Range   Troponin I (High Sensitivity) 8 <18 ng/L    Comment: (NOTE) Elevated high sensitivity troponin I (hsTnI) values and significant  changes across serial measurements may suggest ACS but many other  chronic and acute conditions are known to elevate hsTnI results.  Refer to the "Links" section for chest pain algorithms and additional  guidance. Performed at [facility], [address] [place], [place] [address]   Brain natriuretic peptide     Status: None   Collection Time: 07/29/20  8:32 PM  Result Value Ref Range   B Natriuretic Peptide 31.0 0.0 - 100.0 pg/mL    Comment: Performed at [facility], [address] [place], [place] [address]  Lactic acid, plasma     Status: Abnormal   Collection Time: 07/29/20  8:32 PM  Result Value Ref Range   Lactic Acid, Venous 2.2 (HH) 0.5 - 1.9 mmol/L    Comment: CRITICAL RESULT CALLED TO, READ BACK BY AND VERIFIED WITH: C  WATLINGTON,RN@[id number]  07/29/20 MKELLY Performed at [facility], [address], [place], [place] [address]   CBG monitoring, ED     Status: Abnormal   Collection Time: 07/29/20 10:36 PM  Result Value Ref Range   Glucose-Capillary 57 (L) 70 - 99 mg/dL    Comment: Glucose reference range applies only to samples taken after fasting for at least 8 hours.  Lactic acid, plasma     Status: None   Collection Time: 07/29/20 10:51 PM  Result Value Ref Range   Lactic Acid, Venous 1.9 0.5 - 1.9 mmol/L    Comment: Performed at [facility], [address] [place], [place] [address]  CBG monitoring, ED     Status: Abnormal   Collection Time: 07/29/20 11:20 PM  Result Value Ref Range   Glucose-Capillary 113 (H) 70 - 99 mg/dL    Comment: Glucose reference range applies only to samples taken after fasting for at least 8 hours.  CBG monitoring, ED     Status: Abnormal   Collection Time: 07/30/20  3:34 AM  Result Value Ref Range   Glucose-Capillary 54 (L) 70 - 99 mg/dL    Comment: Glucose reference range applies only to samples taken after fasting for at least 8 hours.  CBG monitoring, ED     Status: Abnormal   Collection Time: 07/30/20  3:54 AM  Result Value Ref Range   Glucose-Capillary 107 (H) 70 - 99 mg/dL    Comment: Glucose reference range applies only to samples taken after fasting for at least 8 hours.  Comprehensive metabolic panel     Status: Abnormal   Collection Time: 07/30/20  5:24 AM  Result Value Ref  Range   Sodium 138 135 - 145 mmol/L   Potassium 3.4 (L) 3.5 - 5.1 mmol/L   Chloride 108 98 - 111 mmol/L   CO2 22 22 - 32 mmol/L   Glucose, Bld 82 70 - 99 mg/dL    Comment: Glucose reference range applies only to samples taken after fasting for at least 8 hours.   BUN 24 (H) 8 - 23 mg/dL   Creatinine, Ser 4.541.85 (H) 0.61 - 1.24 mg/dL   Calcium 8.1 (L) 8.9 - 10.3 mg/dL   Total Protein 6.2 (L) 6.5 - 8.1 g/dL   Albumin 3.1 (L) 3.5 - 5.0 g/dL   AST 12 (L) 15 - 41 U/L   ALT 16 0 - 44 U/L    Alkaline Phosphatase 60 38 - 126 U/L   Total Bilirubin 0.5 0.3 - 1.2 mg/dL   GFR, Estimated 41 (L) >60 mL/min    Comment: (NOTE) Calculated using the CKD-EPI Creatinine Equation (2021)    Anion gap 8 5 - 15    Comment: Performed at [facility], [address] [place], [place] [address]  CBC     Status: Abnormal   Collection Time: 07/30/20  5:24 AM  Result Value Ref Range   WBC 6.6 4.0 - 10.5 K/uL   RBC 3.49 (L) 4.22 - 5.81 MIL/uL   Hemoglobin 11.4 (L) 13.0 - 17.0 g/dL   HCT 91.434.1 (L) 78.239.0 - 95.652.0 %   MCV 97.7 80.0 - 100.0 fL   MCH 32.7 26.0 - 34.0 pg   MCHC 33.4 30.0 - 36.0 g/dL   RDW 21.313.5 08.611.5 - 57.815.5 %   Platelets 166 150 - 400 K/uL   nRBC 0.0 0.0 - 0.2 %    Comment: Performed at [facility], [address] [place], [place] [address]  Protime-INR     Status: None   Collection Time: 07/30/20  5:24 AM  Result Value Ref Range   Prothrombin Time 12.8 11.4 - 15.2 seconds   INR 1.0 0.8 - 1.2    Comment: (NOTE) INR goal varies based on device and disease states. Performed at [facility], [address] [place], [place] [address]   APTT     Status: None   Collection Time: 07/30/20  5:24 AM  Result Value Ref Range   aPTT 30 24 - 36 seconds    Comment: Performed at [facility], [address] [place], [place] [address]  Magnesium     Status: Abnormal   Collection Time: 07/30/20  5:24 AM  Result Value Ref Range   Magnesium 1.6 (L) 1.7 - 2.4 mg/dL    Comment: Performed at [facility], [address] [place], [place] [address]  Phosphorus     Status: None   Collection Time: 07/30/20  5:24 AM  Result Value Ref Range   Phosphorus 3.7 2.5 - 4.6 mg/dL    Comment: Performed at [facility], [address] [place], [place] [address]  Lipase, blood     Status: None   Collection Time: 07/30/20  5:24 AM  Result Value Ref Range   Lipase 29 11 - 51 U/L    Comment: Performed at [facility], [address] [place], [place] [address]  Urinalysis, Routine w reflex microscopic Urine, Clean  Catch     Status: Abnormal   Collection Time: 07/30/20  6:20 AM  Result Value Ref Range   Color, Urine YELLOW YELLOW   APPearance CLEAR CLEAR   Specific Gravity, Urine 1.012 1.005 - 1.030  pH 5.0 5.0 - 8.0   Glucose, UA NEGATIVE NEGATIVE mg/dL   Hgb urine dipstick NEGATIVE NEGATIVE   Bilirubin Urine NEGATIVE NEGATIVE   Ketones, ur NEGATIVE NEGATIVE mg/dL   Protein, ur 100 (A) NEGATIVE mg/dL   Nitrite NEGATIVE NEGATIVE   Leukocytes,Ua NEGATIVE NEGATIVE   RBC / HPF 0-5 0 - 5 RBC/hpf   WBC, UA 0-5 0 - 5 WBC/hpf   Bacteria, UA RARE (A) NONE SEEN    Comment: Performed at [facility], [address] [place], [place] [address]   Personally reviewed CT, [place] and HIDA and reviewed with Thornton Papas- gallbladder with some thickening but no stones, possible duodenal diverticula with contrast or something in it but no definitive stone on [place] or CT, HIDA with filling and EF 92%  CT ABDOMEN PELVIS WO CONTRAST  Result Date: 07/29/2020 CLINICAL DATA:  Abdominal distension. EXAM: CT ABDOMEN AND PELVIS WITHOUT CONTRAST TECHNIQUE: Multidetector CT imaging of the abdomen and pelvis was performed following the standard protocol without IV contrast. COMPARISON:  CT dated 08/15/2019 FINDINGS: Lower chest: The lung bases are clear. The heart size is normal. Hepatobiliary: There appears to be heterogeneous hepatic steatosis. There is diffuse wall thickening of the gallbladder with adjacent fat stranding. There is a potential stone in the distal common bile duct measuring approximately 7 mm (axial series 3, image 37.There is no significant intrahepatic biliary ductal dilatation. Pancreas: Normal contours without ductal dilatation. No peripancreatic fluid collection. Spleen: Unremarkable. Adrenals/Urinary Tract: --Adrenal glands: There is a stable left adrenal mass. --Right kidney/ureter: There is a nonobstructing stone in the upper pole the right kidney measuring approximately 1.1 cm. --Left kidney/ureter: No hydronephrosis or  radiopaque kidney stones. --Urinary bladder: Unremarkable. Stomach/Bowel: --Stomach/Duodenum: No hiatal hernia or other gastric abnormality. Normal duodenal course and caliber. --Small bowel: Unremarkable. --Colon: Unremarkable. --Appendix: Normal. Vascular/Lymphatic: Atherosclerotic calcification is present within the non-aneurysmal abdominal aorta, without hemodynamically significant stenosis. --No retroperitoneal lymphadenopathy. --No mesenteric lymphadenopathy. --No pelvic or inguinal lymphadenopathy. Reproductive: Unremarkable Other: No ascites or free air. The abdominal wall is normal. Musculoskeletal. No acute displaced fractures. IMPRESSION: 1. Diffuse wall thickening of the gallbladder with adjacent fat stranding, concerning for acute cholecystitis. 2. There is a potential 7 mm stone in the distal common bile duct. There is no significant intrahepatic biliary ductal dilatation. 3. Heterogeneous hepatic steatosis. 4. Nonobstructing right nephrolithiasis. Aortic Atherosclerosis (ICD10-I70.0). Electronically Signed   By: Constance Holster M.D.   On: 07/29/2020 22:26   DG Chest 2 View  Result Date: 07/29/2020 CLINICAL DATA:  Cough. EXAM: CHEST - 2 VIEW COMPARISON:  08/14/2019 FINDINGS: Stable mildly enlarged cardiac silhouette. Clear lungs with normal vascularity. Thoracic spine degenerative changes. IMPRESSION: No acute abnormality. Electronically Signed   By: Claudie Revering M.D.   On: 07/29/2020 19:59   NM Hepato W/EF  Result Date: 07/30/2020 CLINICAL DATA:  RIGHT upper quadrant pain and nausea for 6 months EXAM: NUCLEAR MEDICINE HEPATOBILIARY IMAGING WITH GALLBLADDER EF TECHNIQUE: Sequential images of the abdomen were obtained out to 60 minutes following intravenous administration of radiopharmaceutical. After slow intravenous infusion of 1.89 micrograms Cholecystokinin, gallbladder ejection fraction was determined. RADIOPHARMACEUTICALS:  5.5 mCi Tc-53m Choletec IV COMPARISON:  CT abdomen and pelvis  07/29/2020, ultrasound abdomen 07/30/2020 FINDINGS: Normal tracer extraction from bloodstream indicating normal hepatocellular function. Normal excretion of tracer into biliary tree. Gallbladder visualized at 19 min. Small bowel visualized at 10 min. Small amount of duodenal again strict reflux of tracer/bile. No hepatic retention of tracer. Subjectively normal emptying of tracer from gallbladder  following CCK administration. Calculated gallbladder ejection fraction is 92%, normal. Patient reported some abdominal pain following CCK administration. Normal gallbladder ejection fraction following CCK stimulation is greater than 40% at 1 hour. IMPRESSION: Patent biliary tree with normal gallbladder ejection fraction of 92% following CCK administration. No evidence of cystic duct or common bile duct obstruction. Electronically Signed   By: Lavonia Dana M.D.   On: 07/30/2020 12:10   US Abdomen Limited RUQ (LIVER/GB)  Result Date: 07/30/2020 CLINICAL DATA:  Choledocholithiasis by CT, history diabetes mellitus, hypertension, HIV, Kaposi sarcoma, CHF EXAM: ULTRASOUND ABDOMEN LIMITED RIGHT UPPER QUADRANT COMPARISON:  CT abdomen and pelvis 07/29/2020 FINDINGS: Gallbladder: Well distended with mild gallbladder wall thickening. No pericholecystic fluid or sonographic Murphy sign. No definite shadowing calculi visualized. Common bile duct: Diameter: 5 mm, normal Liver: Normal echogenicity without mass or nodularity. No intrahepatic biliary dilatation. Portal vein is patent on color Doppler imaging with normal direction of blood flow towards the liver. Other: No RIGHT upper quadrant free fluid. IMPRESSION: Mild nonspecific gallbladder wall thickening. No evidence of cholelithiasis or biliary dilatation. Remainder of exam unremarkable. Electronically Signed   By: Lavonia Dana M.D.   On: 07/30/2020 10:07     Assessment & Plan:  Danny Cook. is a 60 y.o. male with some minor lower abdominal pain and unclear minor  thickening of the gallbladder but no stones and no cholecystitis on HIDA. He has normal LFTs and it does not really look like there is a stone on the CT on my review with Dr. Thornton Papas and is more likely a diverticula of the duodenum.    -Diet as tolerated -No indication for cholecystectomy and no indication for antibiotics for cholecystitis  -Discuss with GI to get their take on diverticula and if any indication for EGD   All questions were answered to the satisfaction of the patient.  Discussed with Dr. Carles Collet.   Virl Cagey 07/30/2020, 1:36 PM

## 2020-07-30 NOTE — ED Notes (Signed)
Pt transported to nuclear medicine at this time.

## 2020-07-30 NOTE — Consult Note (Signed)
Referring Provider: Dr. Carles Collet  Primary Care Physician:  Danny Ridge, MD Primary Gastroenterologist:  [facility], last seen Jul 17, 2020  Date of Admission: 07/29/20 Date of Consultation: 07/30/20  Reason for Consultation: Abdominal pain   HPI:  Danny Cook. is a 60 y.o. year old male resident of a group home who reported to the ED yesterday after he woke with chills Saturday night, nausea, and low blood sugar. Denied abdominal pain. CT without contrast with diffuse wall thickening of gallbladder with adjacent fat stranding, concerning for acute cholecystitis. Also potential 7 mm stone in distal CBD. Fatty liver. RUQ [place] with nonspecific gallbladder wall thickening. No cholelithiasis or biliary dilation. CBD was 46mm. HIDA normal with EF of 92%. LFTs normal. Lipase 29. GI consulted due to concerns for possible choledocholithiasis.   CT reviewed by Surgery with Dr. Thornton Papas, radiologist. Possible duodenal diverticula with contrast but no definitive stone on [place], CT.   Patient is established with [facility] and last seen Jul 17, 2020, with plans for colonoscopy in March. His last EGD was in 2018 by Dr. [name] with [facility]. Non-bleeding erosive gastropathy, negative H.pylori  He denies abdominal pain currently. States he does have left lower abdominal discomfort at times if laying down and proceeds to press deeply into bilateral abdomen while sitting up to show me. States he has no pain sitting up. No rectal bleeding. Takes stool softener and laxative daily. Chronic GERD on omeprazole 40 mg BID. BM every 2-3 days. Occasionally straining. Feels well now.    Past Medical History:  Diagnosis Date  . Anemia   . Anxiety   . Arthritis   . CHF (congestive heart failure) ([place])   . Chronic kidney disease    Renal Insufficiency Syndrome; Glomerulosclerosis 2013  . Depressed   . Diabetes mellitus without complication ([place])   . GERD (gastroesophageal reflux disease)   .  High cholesterol   . HIV (human immunodeficiency virus infection) ([place])   . Hypertension   . 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 Paranoid disorder ([place])   . Pneumonia   . Schizophrenia, paranoid ([place])   . Sleep apnea   . TIA (transient ischemic attack)     Past Surgical History:  Procedure Laterality Date  . CARDIAC CATHETERIZATION    . COLONOSCOPY WITH PROPOFOL N/A 09/03/2015   Procedure: COLONOSCOPY WITH PROPOFOL;  Surgeon: Lollie Sails, MD;  Location: [facility] ENDOSCOPY;  Service: Endoscopy;  Laterality: N/A;  . DG TEETH FULL    . ESOPHAGOGASTRODUODENOSCOPY (EGD) WITH PROPOFOL N/A 07/01/2016   Dr. [name], [facility]. abnormal esophageal motility, suspicious for presbyesophagus, bile gastritis s/p biopsy, non-bleeding erosive gastropathy s/p biopsy, normal duodenum, path with negative H.pylori and +chronic active gastritis    Prior to Admission medications   Medication Sig Start Date End Date Taking? Authorizing Provider  abacavir-dolutegravir-lamiVUDine (TRIUMEQ) 600-50-300 MG tablet Take 1 tablet by mouth at bedtime. 07/21/19   Tsosie Billing, MD  albuterol (PROVENTIL HFA;VENTOLIN HFA) 108 (90 Base) MCG/ACT inhaler Inhale 2 puffs into the lungs every 6 (six) hours as needed for wheezing or shortness of breath. 04/14/18   Loletha Grayer, MD  aspirin EC 325 MG tablet Take 325 mg by mouth daily.    [provider]  butalbital-aspirin-caffeine Acquanetta Chain) 50-325-40 MG capsule Take 1-2 capsules by mouth every 6 (six) hours as needed for headache.     [provider]  cyanocobalamin 1000 MCG tablet Take 1,000 mcg by mouth daily.  [provider]  diazepam (VALIUM) 5 MG tablet Take 5 mg by mouth 2 (two) times daily as needed for anxiety.     [provider]  diphenhydrAMINE (BENADRYL) 25 mg capsule Take 25 mg by mouth at bedtime.    [provider]  divalproex (DEPAKOTE ER) 500 MG 24 hr tablet Take 500 mg by mouth at bedtime.     [provider]  docusate sodium (COLACE) 250 MG capsule Take 250 mg by mouth daily.    [provider]  donepezil (ARICEPT) 5 MG tablet Take 10 mg by mouth daily.  07/19/19   [provider]  FANAPT 12 MG TABS Take 12 mg by mouth 2 (two) times daily.  11/14/16   [provider]  ferrous sulfate 325 (65 FE) MG tablet Take 325 mg by mouth daily with breakfast.    [provider]  fluticasone (FLOVENT HFA) 220 MCG/ACT inhaler Inhale 2 puffs into the lungs 2 (two) times daily. Rinse out mouth afterwards 04/14/18   Loletha Grayer, MD  gabapentin (NEURONTIN) 400 MG capsule Take 400 mg by mouth 3 (three) times daily.     [provider]  gemfibrozil (LOPID) 600 MG tablet Take 600 mg by mouth 2 (two) times daily before a meal.    [provider]  glimepiride (AMARYL) 4 MG tablet Take 4 mg by mouth daily with breakfast.  06/06/19   [provider]  glycopyrrolate (ROBINUL) 1 MG tablet Take 1 mg by mouth 2 (two) times daily.    [provider]  Insulin Aspart (NOVOLOG FLEXPEN [place]) Inject into the skin. Sliding scale    [provider]  Insulin Degludec (TRESIBA FLEXTOUCH) 200 UNIT/ML SOPN Inject 10 Units into the skin daily. Patient taking differently: Inject 50 Units into the skin daily. 08/19/19   Nolberto Hanlon, MD  losartan (COZAAR) 25 MG tablet Take 1 tablet (25 mg total) by mouth daily. 05/04/18   Vaughan Basta, MD  memantine (NAMENDA) 10 MG tablet Take 10 mg by mouth 2 (two) times daily.    [provider]  metFORMIN (GLUCOPHAGE) 1000 MG tablet Take 1,000 mg by mouth 2 (two) times daily with a meal.    [provider]  metoprolol tartrate (LOPRESSOR) 25 MG tablet Take 0.5 tablets (12.5 mg total) by mouth 2 (two) times daily. 05/03/18   Vaughan Basta, MD  mirtazapine (REMERON) 30 MG tablet Take 30 mg by mouth at bedtime. 04/21/18   [provider]  mometasone (ELOCON) 0.1 % ointment  Apply topically daily. Apply 4 drops to ear canal at bedtime as needed for itching and dry skin.    [provider]  Multiple Vitamin (MULTIVITAMIN) tablet Take 1 tablet by mouth daily.    [provider]  nitroGLYCERIN (NITROSTAT) 0.4 MG SL tablet Place 0.4 mg under the tongue every 5 (five) minutes as needed for chest pain.     [provider]  Omega-3 Fatty Acids (FISH OIL) 1000 MG CAPS Take 1,000 mg by mouth daily.    [provider]  omeprazole (PRILOSEC) 40 MG capsule Take 40 mg by mouth 2 (two) times daily.     [provider]  OZEMPIC, 0.25 OR 0.5 MG/DOSE, 2 MG/1.5ML SOPN Inject 1 mg into the skin once a week.  06/06/19   [provider]  rosuvastatin (CRESTOR) 40 MG tablet Take 40 mg by mouth daily.     [provider]  senna (SENOKOT) 8.6 MG TABS tablet Take 1  tablet by mouth daily.     [provider]  sucralfate (CARAFATE) 1 g tablet Take 1 g by mouth 4 (four) times daily.  04/21/18   [provider]  tamsulosin (FLOMAX) 0.4 MG CAPS capsule Take 0.4 mg by mouth daily. 08/02/19   [provider]  triamcinolone cream (KENALOG) 0.1 % Apply 1 application topically 2 (two) times daily as needed. Patient taking differently: Apply 1 application topically 2 (two) times daily. 11/16/16   Little, Traci M, PA-C  venlafaxine XR (EFFEXOR-XR) 75 MG 24 hr capsule Take 1 capsule (75 mg total) by mouth daily with breakfast. 11/09/17   McNew, Tyson Babinski, MD  ziprasidone (GEODON) 20 MG capsule Take 20 mg by mouth daily.    [provider]    Current Facility-Administered Medications  Medication Dose Route Frequency Provider Last Rate Last Admin  . 0.9 % NaCl with KCl 20 mEq/ L  infusion   Intravenous Continuous Tat, Shanon Brow, MD 75 mL/hr at 07/30/20 1145 New Bag at 07/30/20 1145  . morphine 2 MG/ML injection 2 mg  2 mg Intravenous Q4H PRN Adefeso, Oladapo, DO      . piperacillin-tazobactam (ZOSYN) IVPB 3.375 g  3.375  g Intravenous Q8H Adefeso, Oladapo, DO   Stopped at 07/30/20 1108   Current Outpatient Medications  Medication Sig Dispense Refill  . abacavir-dolutegravir-lamiVUDine (TRIUMEQ) 600-50-300 MG tablet Take 1 tablet by mouth at bedtime. 30 tablet 6  . albuterol (PROVENTIL HFA;VENTOLIN HFA) 108 (90 Base) MCG/ACT inhaler Inhale 2 puffs into the lungs every 6 (six) hours as needed for wheezing or shortness of breath. 1 Inhaler 2  . aspirin EC 325 MG tablet Take 325 mg by mouth daily.    . butalbital-aspirin-caffeine (FIORINAL) 50-325-40 MG capsule Take 1-2 capsules by mouth every 6 (six) hours as needed for headache.     . cyanocobalamin 1000 MCG tablet Take 1,000 mcg by mouth daily.     . diazepam (VALIUM) 5 MG tablet Take 5 mg by mouth 2 (two) times daily as needed for anxiety.     . diphenhydrAMINE (BENADRYL) 25 mg capsule Take 25 mg by mouth at bedtime.    . divalproex (DEPAKOTE ER) 500 MG 24 hr tablet Take 500 mg by mouth at bedtime.     . docusate sodium (COLACE) 250 MG capsule Take 250 mg by mouth daily.    [name] donepezil (ARICEPT) 5 MG tablet Take 10 mg by mouth daily.     [name] FANAPT 12 MG TABS Take 12 mg by mouth 2 (two) times daily.     . ferrous sulfate 325 (65 FE) MG tablet Take 325 mg by mouth daily with breakfast.    . fluticasone (FLOVENT HFA) 220 MCG/ACT inhaler Inhale 2 puffs into the lungs 2 (two) times daily. Rinse out mouth afterwards 1 Inhaler 12  . gabapentin (NEURONTIN) 400 MG capsule Take 400 mg by mouth 3 (three) times daily.     [name] gemfibrozil (LOPID) 600 MG tablet Take 600 mg by mouth 2 (two) times daily before a meal.    . glimepiride (AMARYL) 4 MG tablet Take 4 mg by mouth daily with breakfast.     . glycopyrrolate (ROBINUL) 1 MG tablet Take 1 mg by mouth 2 (two) times daily.    . Insulin Aspart (NOVOLOG FLEXPEN [place]) Inject into the skin. Sliding scale    . Insulin Degludec (TRESIBA FLEXTOUCH) 200 UNIT/ML SOPN Inject 10 Units into the skin daily. (Patient taking differently:  Inject 50 Units into the  skin daily.)    . losartan (COZAAR) 25 MG tablet Take 1 tablet (25 mg total) by mouth daily. 30 tablet 0  . memantine (NAMENDA) 10 MG tablet Take 10 mg by mouth 2 (two) times daily.    . metFORMIN (GLUCOPHAGE) 1000 MG tablet Take 1,000 mg by mouth 2 (two) times daily with a meal.    . metoprolol tartrate (LOPRESSOR) 25 MG tablet Take 0.5 tablets (12.5 mg total) by mouth 2 (two) times daily. 30 tablet 0  . mirtazapine (REMERON) 30 MG tablet Take 30 mg by mouth at bedtime.    . mometasone (ELOCON) 0.1 % ointment Apply topically daily. Apply 4 drops to ear canal at bedtime as needed for itching and dry skin.    . Multiple Vitamin (MULTIVITAMIN) tablet Take 1 tablet by mouth daily.    . nitroGLYCERIN (NITROSTAT) 0.4 MG SL tablet Place 0.4 mg under the tongue every 5 (five) minutes as needed for chest pain.     . Omega-3 Fatty Acids (FISH OIL) 1000 MG CAPS Take 1,000 mg by mouth daily.    [name] omeprazole (PRILOSEC) 40 MG capsule Take 40 mg by mouth 2 (two) times daily.     [name] OZEMPIC, 0.25 OR 0.5 MG/DOSE, 2 MG/1.5ML SOPN Inject 1 mg into the skin once a week.     . rosuvastatin (CRESTOR) 40 MG tablet Take 40 mg by mouth daily.     [name] senna (SENOKOT) 8.6 MG TABS tablet Take 1 tablet by mouth daily.     . sucralfate (CARAFATE) 1 g tablet Take 1 g by mouth 4 (four) times daily.     . tamsulosin (FLOMAX) 0.4 MG CAPS capsule Take 0.4 mg by mouth daily.    [name] triamcinolone cream (KENALOG) 0.1 % Apply 1 application topically 2 (two) times daily as needed. (Patient taking differently: Apply 1 application topically 2 (two) times daily.) 30 g 0  . venlafaxine XR (EFFEXOR-XR) 75 MG 24 hr capsule Take 1 capsule (75 mg total) by mouth daily with breakfast. 30 capsule 0  . ziprasidone (GEODON) 20 MG capsule Take 20 mg by mouth daily.      Allergies as of 07/29/2020 - Review Complete 07/29/2020  Allergen Reaction Noted  . Trazodone and nefazodone  06/27/2016  . Trazodone hcl Other (See  Comments) 06/27/2016  . Viread [tenofovir disoproxil]  12/15/2014  . Etodolac Rash 06/27/2016    Family History  Problem Relation Age of Onset  . Heart attack Mother   . Diabetes Mellitus II Mother   . Mental illness Mother   . CAD Mother   . Heart attack Father   . CAD Father   . Hypertension Father     Social History   Socioeconomic History  . Marital status: Single    Spouse name: Not on file  . Number of children: Not on file  . Years of education: 81  . Highest education level: GED or equivalent  Occupational History  . Not on file  Tobacco Use  . Smoking status: Never Smoker  . Smokeless tobacco: Never Used  Vaping Use  . Vaping Use: Never used  Substance and Sexual Activity  . Alcohol use: No  . Drug use: No  . Sexual activity: Never  Other Topics Concern  . Not on file  Social History Narrative  . Not on file   Social Determinants of Health   Financial Resource Strain: Not on file  Food Insecurity: Not on file  Transportation Needs: Not on file  Physical Activity: Not on file  Stress: Not on file  Social Connections: Not on file  Intimate Partner Violence: Not on file    Review of Systems: Gen: see HPI CV: Denies chest pain, heart palpitations, syncope, edema  Resp: Denies shortness of breath with rest, cough, wheezing GI: see HPI GU : Denies urinary burning, urinary frequency, urinary incontinence.  MS: Denies joint pain,swelling, cramping Derm: Denies rash, itching, dry skin Psych: Denies depression, anxiety,confusion, or memory loss Heme: Denies bruising, bleeding, and enlarged lymph nodes.  Physical Exam: Vital signs in last 24 hours: Temp:  [98.4 F (36.9 C)] 98.4 F (36.9 C) (01/30 1914) Pulse Rate:  [56-66] 61 (01/31 1146) Resp:  [15-17] 15 (01/31 1146) BP: (82-137)/(57-79) 137/79 (01/31 1146) SpO2:  [94 %-97 %] 96 % (01/31 1146) FiO2 (%):  [21 %] 21 % (01/30 2355) Weight:  [97.1 kg] 97.1 kg (01/30 1915)   General:   Alert,   Well-developed, well-nourished, pleasant and cooperative in NAD Head:  Normocephalic and atraumatic. Eyes:  Sclera clear, no icterus.   Conjunctiva pink. Ears:  Normal auditory acuity. Nose:  No deformity, discharge,  or lesions. Mouth:  No deformity or lesions, dentition normal. Lungs:  Clear throughout to auscultation.   No wheezes, crackles, or rhonchi. No acute distress. Heart:  S1 S2 present without murmurs Abdomen:  Soft, nontender and nondistended. No masses, hepatosplenomegaly or hernias noted. Normal bowel sounds, without guarding, and without rebound.   Rectal:  Deferred    Msk:  Symmetrical without gross deformities. Normal posture. Pulses:  Normal pulses noted. Extremities:  Without edema. Neurologic:  Alert and  oriented x4 Psych:  Alert and cooperative. Normal mood and affect.  Intake/Output from previous day: 01/30 0701 - 01/31 0700 In: 1150 [IV Piggyback:1150] Out: -  Intake/Output this shift: Total I/O In: 100 [IV Piggyback:100] Out: 800 [Urine:800]  Lab Results: Recent Labs    07/29/20 2032 07/30/20 0524  WBC 8.6 6.6  HGB 11.8* 11.4*  HCT 35.8* 34.1*  PLT 192 166   BMET Recent Labs    07/29/20 2032 07/30/20 0524  NA 138 138  K 3.5 3.4*  CL 106 108  CO2 20* 22  GLUCOSE 60* 82  BUN 24* 24*  CREATININE 2.30* 1.85*  CALCIUM 8.6* 8.1*   LFT Recent Labs    07/29/20 2032 07/30/20 0524  PROT 6.8 6.2*  ALBUMIN 3.5 3.1*  AST 15 12*  ALT 17 16  ALKPHOS 68 60  BILITOT 0.4 0.5   PT/INR Recent Labs    07/30/20 0524  LABPROT 12.8  INR 1.0   Hepatitis Panel No results for input(s): HEPBSAG, HCVAB, HEPAIGM, HEPBIGM in the last 72 hours. C-Diff No results for input(s): CDIFFTOX in the last 72 hours.  Studies/Results: CT ABDOMEN PELVIS WO CONTRAST  Result Date: 07/29/2020 CLINICAL DATA:  Abdominal distension. EXAM: CT ABDOMEN AND PELVIS WITHOUT CONTRAST TECHNIQUE: Multidetector CT imaging of the abdomen and pelvis was performed following the  standard protocol without IV contrast. COMPARISON:  CT dated 08/15/2019 FINDINGS: Lower chest: The lung bases are clear. The heart size is normal. Hepatobiliary: There appears to be heterogeneous hepatic steatosis. There is diffuse wall thickening of the gallbladder with adjacent fat stranding. There is a potential stone in the distal common bile duct measuring approximately 7 mm (axial series 3, image 37.There is no significant intrahepatic biliary ductal dilatation. Pancreas: Normal contours without ductal dilatation. No peripancreatic fluid collection. Spleen: Unremarkable. Adrenals/Urinary Tract: --Adrenal glands: There is a stable left adrenal mass. --  Right kidney/ureter: There is a nonobstructing stone in the upper pole the right kidney measuring approximately 1.1 cm. --Left kidney/ureter: No hydronephrosis or radiopaque kidney stones. --Urinary bladder: Unremarkable. Stomach/Bowel: --Stomach/Duodenum: No hiatal hernia or other gastric abnormality. Normal duodenal course and caliber. --Small bowel: Unremarkable. --Colon: Unremarkable. --Appendix: Normal. Vascular/Lymphatic: Atherosclerotic calcification is present within the non-aneurysmal abdominal aorta, without hemodynamically significant stenosis. --No retroperitoneal lymphadenopathy. --No mesenteric lymphadenopathy. --No pelvic or inguinal lymphadenopathy. Reproductive: Unremarkable Other: No ascites or free air. The abdominal wall is normal. Musculoskeletal. No acute displaced fractures. IMPRESSION: 1. Diffuse wall thickening of the gallbladder with adjacent fat stranding, concerning for acute cholecystitis. 2. There is a potential 7 mm stone in the distal common bile duct. There is no significant intrahepatic biliary ductal dilatation. 3. Heterogeneous hepatic steatosis. 4. Nonobstructing right nephrolithiasis. Aortic Atherosclerosis (ICD10-I70.0). Electronically Signed   By: Constance Holster M.D.   On: 07/29/2020 22:26   DG Chest 2 View  Result  Date: 07/29/2020 CLINICAL DATA:  Cough. EXAM: CHEST - 2 VIEW COMPARISON:  08/14/2019 FINDINGS: Stable mildly enlarged cardiac silhouette. Clear lungs with normal vascularity. Thoracic spine degenerative changes. IMPRESSION: No acute abnormality. Electronically Signed   By: Claudie Revering M.D.   On: 07/29/2020 19:59   NM Hepato W/EF  Result Date: 07/30/2020 CLINICAL DATA:  RIGHT upper quadrant pain and nausea for 6 months EXAM: NUCLEAR MEDICINE HEPATOBILIARY IMAGING WITH GALLBLADDER EF TECHNIQUE: Sequential images of the abdomen were obtained out to 60 minutes following intravenous administration of radiopharmaceutical. After slow intravenous infusion of 1.89 micrograms Cholecystokinin, gallbladder ejection fraction was determined. RADIOPHARMACEUTICALS:  5.5 mCi Tc-90m Choletec IV COMPARISON:  CT abdomen and pelvis 07/29/2020, ultrasound abdomen 07/30/2020 FINDINGS: Normal tracer extraction from bloodstream indicating normal hepatocellular function. Normal excretion of tracer into biliary tree. Gallbladder visualized at 19 min. Small bowel visualized at 10 min. Small amount of duodenal again strict reflux of tracer/bile. No hepatic retention of tracer. Subjectively normal emptying of tracer from gallbladder following CCK administration. Calculated gallbladder ejection fraction is 92%, normal. Patient reported some abdominal pain following CCK administration. Normal gallbladder ejection fraction following CCK stimulation is greater than 40% at 1 hour. IMPRESSION: Patent biliary tree with normal gallbladder ejection fraction of 92% following CCK administration. No evidence of cystic duct or common bile duct obstruction. Electronically Signed   By: Lavonia Dana M.D.   On: 07/30/2020 12:10   US Abdomen Limited RUQ (LIVER/GB)  Result Date: 07/30/2020 CLINICAL DATA:  Choledocholithiasis by CT, history diabetes mellitus, hypertension, HIV, Kaposi sarcoma, CHF EXAM: ULTRASOUND ABDOMEN LIMITED RIGHT UPPER QUADRANT  COMPARISON:  CT abdomen and pelvis 07/29/2020 FINDINGS: Gallbladder: Well distended with mild gallbladder wall thickening. No pericholecystic fluid or sonographic Murphy sign. No definite shadowing calculi visualized. Common bile duct: Diameter: 5 mm, normal Liver: Normal echogenicity without mass or nodularity. No intrahepatic biliary dilatation. Portal vein is patent on color Doppler imaging with normal direction of blood flow towards the liver. Other: No RIGHT upper quadrant free fluid. IMPRESSION: Mild nonspecific gallbladder wall thickening. No evidence of cholelithiasis or biliary dilatation. Remainder of exam unremarkable. Electronically Signed   By: Lavonia Dana M.D.   On: 07/30/2020 10:07    Impression: Very pleasant 60 year old male, resident of a group home, reporting to the ED yesterday after awakening with chills, nausea, and reports of vague left lower abdominal pain. Currently without any pain and feeling back to his baseline.  CT without contrast with diffuse wall thickening of gallbladder with adjacent fat stranding, concerning for acute cholecystitis.  Also potential 7 mm stone in distal CBD. Fatty liver. RUQ [place] with nonspecific gallbladder wall thickening. No cholelithiasis or biliary dilation. CBD was 29mm. HIDA normal with EF of 92%. LFTs normal. Lipase 29.   Does not appear to have any choledocholithiasis or other acute GI concerns. Possible duodenal diverticula likely incidental finding. He is established with GI elsewhere, with colonoscopy planned for March 2022. From his report, could benefit from follow-up with primary GI to discuss better bowel regimen going forward.    Plan: Recommend outpatient follow-up with primary GI Keep plans for colonoscopy as outpatient PPI BID No need for endoscopic evaluation currently.  Will sign off.    Annitta Needs, PhD, ANP-BC [facility]     LOS: 1 day    07/30/2020, 2:49 PM

## 2020-07-31 DIAGNOSIS — R1030 Lower abdominal pain, unspecified: Secondary | ICD-10-CM | POA: Diagnosis not present

## 2020-07-31 LAB — COMPREHENSIVE METABOLIC PANEL
ALT: 17 U/L (ref 0–44)
AST: 17 U/L (ref 15–41)
Albumin: 3.3 g/dL — ABNORMAL LOW (ref 3.5–5.0)
Alkaline Phosphatase: 69 U/L (ref 38–126)
Anion gap: 6 (ref 5–15)
BUN: 15 mg/dL (ref 8–23)
CO2: 26 mmol/L (ref 22–32)
Calcium: 9 mg/dL (ref 8.9–10.3)
Chloride: 108 mmol/L (ref 98–111)
Creatinine, Ser: 1.16 mg/dL (ref 0.61–1.24)
GFR, Estimated: >60 mL/min (ref >60)
Glucose, Bld: 131 mg/dL — ABNORMAL HIGH (ref 70–99)
Potassium: 4.1 mmol/L (ref 3.5–5.1)
Sodium: 140 mmol/L (ref 135–145)
Total Bilirubin: 0.5 mg/dL (ref 0.3–1.2)
Total Protein: 6.9 g/dL (ref 6.5–8.1)

## 2020-07-31 LAB — CBC
HCT: 36 % — ABNORMAL LOW (ref 39.0–52.0)
Hemoglobin: 11.9 g/dL — ABNORMAL LOW (ref 13.0–17.0)
MCH: 32.4 pg (ref 26.0–34.0)
MCHC: 33.1 g/dL (ref 30.0–36.0)
MCV: 98.1 fL (ref 80.0–100.0)
Platelets: 196 10*3/uL (ref 150–400)
RBC: 3.67 MIL/uL — ABNORMAL LOW (ref 4.22–5.81)
RDW: 13.2 % (ref 11.5–15.5)
WBC: 6.9 10*3/uL (ref 4.0–10.5)
nRBC: 0 % (ref 0.0–0.2)

## 2020-07-31 LAB — MAGNESIUM: Magnesium: 1.8 mg/dL (ref 1.7–2.4)

## 2020-07-31 LAB — GLUCOSE, CAPILLARY
Glucose-Capillary: 130 mg/dL — ABNORMAL HIGH (ref 70–99)
Glucose-Capillary: 131 mg/dL — ABNORMAL HIGH (ref 70–99)
Glucose-Capillary: 122 mg/dL — ABNORMAL HIGH (ref 70–99)
Glucose-Capillary: 233 mg/dL — ABNORMAL HIGH (ref 70–99)

## 2020-07-31 LAB — HEMOGLOBIN A1C
Hgb A1c MFr Bld: 6.3 % — ABNORMAL HIGH (ref 4.8–5.6)
Mean Plasma Glucose: 134.11 mg/dL

## 2020-07-31 MED ORDER — DONEPEZIL HCL 5 MG PO TABS
10.0000 mg | ORAL_TABLET | Freq: Every day | ORAL | Status: DC
  Administered 2020-07-31: 10 mg via ORAL
  Filled 2020-07-31: qty 2

## 2020-07-31 MED ORDER — ROSUVASTATIN CALCIUM 20 MG PO TABS
40.0000 mg | ORAL_TABLET | Freq: Every day | ORAL | Status: DC
  Administered 2020-07-31: 40 mg via ORAL
  Filled 2020-07-31: qty 2

## 2020-07-31 MED ORDER — GABAPENTIN 400 MG PO CAPS
400.0000 mg | ORAL_CAPSULE | Freq: Three times a day (TID) | ORAL | Status: DC
  Administered 2020-07-31 (×2): 400 mg via ORAL
  Filled 2020-07-31 (×4): qty 1

## 2020-07-31 NOTE — Progress Notes (Signed)
Blood glucose level: 130

## 2020-07-31 NOTE — Discharge Summary (Signed)
Physician Discharge Summary  Danny Cook. ON:[id number] DOB: [birth year] DOA: 07/29/2020  PCP: Ebbie Ridge, MD  Admit date: 07/29/2020 Discharge date: 07/31/2020  Admitted From: ALF Disposition:  ALF Recommendations for Outpatient Follow-up:  1. Follow up with PCP in 1-2 weeks 2. Please obtain BMP/CBC in one week     Discharge Condition: Stable CODE STATUS:FULL Diet recommendation: Heart Healthy / Carb Modified   Brief/Interim Summary: 60 year old male with a history of coronary artery disease, diabetes mellitus type 2, hypertension, hyperlipidemia, HIV, paranoid schizophrenia, diastolic CHF, cognitive impairment presenting with 2-day history of lower abdominal pain with nausea and subjective fevers and chills.  Patient had also complained of decreased oral intake and some dizziness.  He denies any chest pain, shortness breath, emesis, diarrhea, dysuria, hematuria.  He has not had any coughing or hemoptysis. In the emergency department, the patient was afebrile, but was noted to be hypotensive initially with a blood pressure of 82/63.  Oxygen saturation was 95% on room air.  BMP showed serum creatinine 2.30 with unremarkable LFTs.  WBC 8.6, hemoglobin 11.8, platelets 166,000.  UA was negative for pyuria.  Lactic acid peaked at 2.2.  CT of the abdomen and pelvis showed diffuse gallbladder wall thickening with adjacent fat stranding.  There was also a possible 7 mm common duct stone, but without any intrahepatic biliary ductal dilatation.  General surgery and GI were consulted to assist with management.  Discharge Diagnoses:  Abdominal pain/gallbladder wall thickening -Concern for cholecystitis -1/30 CT abd--diffuse GB wall thickening with adj fat stranding, possible 28mm CBD stone, no ductal dilatation -Continue IV Zosyn pending culture data due to hypotension and elevated lactate -Continued IV fluids -General surgery consult-->not cholecystitis -HIDA scan--neg, EF  92% -etiology abd pain unclear -GI consult appreciated-->stable for outpatient colonoscopy/endoscopy; no further procedures for now -discussed with general surgery, Dr. Constance Haw -pain was primarily LLQ, which has resolved during hospitalization -diet advanced which patient tolerated  Abnormal CT abdomen pelvis -Initially concerning for choledocholithiasis with CT -RUQ US--no biliary ductal dilatation-mild GB wall thickenin -doubt choledocholithiasis with normal bili -GI consult--stable for outpatient colonoscopy/endoscopy -diet advanced which patient tolerated  Acute on chronic renal failure--CKD stage IIIa -Baseline creatinine 1.0-1.3 -Presented with serum creatinine 2.30 -Continue IV fluids -serum creatinine 1.16 on day of d/c  Elevated lactate -due to volume depletion -sepsis ruled out  Coronary Artery Disease -no chest pain -personally reviewed EKG--sinus nonspecific T wave change -holding metoprolol due to soft BPs -continue ASA  Essential Hypertension -holding metoprolol and losartan due to soft BPs and bradycardia -BPs remained well controlled -will not restart meds  Cognitive Impairment/Schizophrenia -continue namenda and aricept -continue depakote and valium  Diabetes Mellitus type 2 -check A1C--pending at time of d/c -holding long acting insulin for now due to hypoglycemia-->improved and hyperglycemic again -holding metformin and amaryl--restart after d/c -novolog sliding scale  HIV -restart Triumeq -07/12/20 HIV RNA--undetectable -07/12/20 CD4--752/39%  Hyperlipidemia -continue statin  Chronic diastolic CHF -clinically euvolemic -07/25/20 Echo-EF 55-60%, no WMA, G1DD, trivial MR/AI   Hypokalemia/hypomagnesemia -repleted   Discharge Instructions   Allergies as of 07/31/2020      Reactions   Trazodone And Nefazodone    Affects other meds that they dont work   Trazodone Hcl Other (See Comments)   Viread [tenofovir Disoproxil]     Damages kidneys   Etodolac Rash      Medication List    STOP taking these medications   losartan 25 MG tablet Commonly known as: COZAAR   metoprolol tartrate  25 MG tablet Commonly known as: LOPRESSOR   NOVOLOG FLEXPEN Twin     TAKE these medications   albuterol 108 (90 Base) MCG/ACT inhaler Commonly known as: VENTOLIN HFA Inhale 2 puffs into the lungs every 6 (six) hours as needed for wheezing or shortness of breath.   aspirin EC 325 MG tablet Take 325 mg by mouth daily.   butalbital-aspirin-caffeine 50-325-40 MG capsule Commonly known as: FIORINAL Take 1-2 capsules by mouth every 6 (six) hours as needed for headache.   cyanocobalamin 1000 MCG tablet Take 1,000 mcg by mouth daily.   diazepam 5 MG tablet Commonly known as: VALIUM Take 5 mg by mouth 2 (two) times daily as needed for anxiety.   diphenhydrAMINE 25 mg capsule Commonly known as: BENADRYL Take 25 mg by mouth at bedtime.   divalproex 500 MG 24 hr tablet Commonly known as: DEPAKOTE ER Take 500 mg by mouth at bedtime.   docusate sodium 250 MG capsule Commonly known as: COLACE Take 250 mg by mouth daily.   donepezil 5 MG tablet Commonly known as: ARICEPT Take 10 mg by mouth daily.   Fanapt 12 MG Tabs Generic drug: Iloperidone Take 12 mg by mouth 2 (two) times daily.   ferrous sulfate 325 (65 FE) MG tablet Take 325 mg by mouth daily with breakfast.   Fish Oil 1000 MG Caps Take 1,000 mg by mouth daily.   fluticasone 220 MCG/ACT inhaler Commonly known as: Flovent HFA Inhale 2 puffs into the lungs 2 (two) times daily. Rinse out mouth afterwards   gabapentin 400 MG capsule Commonly known as: NEURONTIN Take 400 mg by mouth 3 (three) times daily.   gemfibrozil 600 MG tablet Commonly known as: LOPID Take 600 mg by mouth 2 (two) times daily before a meal.   glimepiride 4 MG tablet Commonly known as: AMARYL Take 4 mg by mouth daily with breakfast.   glycopyrrolate 1 MG tablet Commonly known as:  ROBINUL Take 1 mg by mouth 2 (two) times daily.   memantine 10 MG tablet Commonly known as: NAMENDA Take 10 mg by mouth 2 (two) times daily.   metFORMIN 1000 MG tablet Commonly known as: GLUCOPHAGE Take 1,000 mg by mouth 2 (two) times daily with a meal.   mirtazapine 30 MG tablet Commonly known as: REMERON Take 30 mg by mouth at bedtime.   mometasone 0.1 % ointment Commonly known as: ELOCON Apply topically daily. Apply 4 drops to ear canal at bedtime as needed for itching and dry skin.   multivitamin tablet Take 1 tablet by mouth daily.   nitroGLYCERIN 0.4 MG SL tablet Commonly known as: NITROSTAT Place 0.4 mg under the tongue every 5 (five) minutes as needed for chest pain.   omeprazole 40 MG capsule Commonly known as: PRILOSEC Take 40 mg by mouth 2 (two) times daily.   Ozempic (0.25 or 0.5 MG/DOSE) 2 MG/1.5ML Sopn Generic drug: Semaglutide(0.25 or 0.5MG /DOS) Inject 1 mg into the skin once a week.   rosuvastatin 40 MG tablet Commonly known as: CRESTOR Take 40 mg by mouth daily.   senna 8.6 MG Tabs tablet Commonly known as: SENOKOT Take 1 tablet by mouth daily.   sucralfate 1 g tablet Commonly known as: CARAFATE Take 1 g by mouth 4 (four) times daily.   tamsulosin 0.4 MG Caps capsule Commonly known as: FLOMAX Take 0.4 mg by mouth daily.   Tyler Aas FlexTouch 200 UNIT/ML FlexTouch Pen Generic drug: insulin degludec Inject 10 Units into the skin daily. What changed: how much  to take   triamcinolone 0.1 % Commonly known as: KENALOG Apply 1 application topically 2 (two) times daily as needed. What changed: when to take this   Triumeq 600-50-300 MG tablet Generic drug: abacavir-dolutegravir-lamiVUDine Take 1 tablet by mouth at bedtime.   venlafaxine XR 75 MG 24 hr capsule Commonly known as: EFFEXOR-XR Take 1 capsule (75 mg total) by mouth daily with breakfast.   ziprasidone 20 MG capsule Commonly known as: GEODON Take 20 mg by mouth daily.        Allergies  Allergen Reactions  . Trazodone And Nefazodone     Affects other meds that they dont work  . Trazodone Hcl Other (See Comments)  . Viread [Tenofovir Disoproxil]     Damages kidneys  . Etodolac Rash    Consultations:  General surgery  GI   Procedures/Studies: CT ABDOMEN PELVIS WO CONTRAST  Result Date: 07/29/2020 CLINICAL DATA:  Abdominal distension. EXAM: CT ABDOMEN AND PELVIS WITHOUT CONTRAST TECHNIQUE: Multidetector CT imaging of the abdomen and pelvis was performed following the standard protocol without IV contrast. COMPARISON:  CT dated 08/15/2019 FINDINGS: Lower chest: The lung bases are clear. The heart size is normal. Hepatobiliary: There appears to be heterogeneous hepatic steatosis. There is diffuse wall thickening of the gallbladder with adjacent fat stranding. There is a potential stone in the distal common bile duct measuring approximately 7 mm (axial series 3, image 37.There is no significant intrahepatic biliary ductal dilatation. Pancreas: Normal contours without ductal dilatation. No peripancreatic fluid collection. Spleen: Unremarkable. Adrenals/Urinary Tract: --Adrenal glands: There is a stable left adrenal mass. --Right kidney/ureter: There is a nonobstructing stone in the upper pole the right kidney measuring approximately 1.1 cm. --Left kidney/ureter: No hydronephrosis or radiopaque kidney stones. --Urinary bladder: Unremarkable. Stomach/Bowel: --Stomach/Duodenum: No hiatal hernia or other gastric abnormality. Normal duodenal course and caliber. --Small bowel: Unremarkable. --Colon: Unremarkable. --Appendix: Normal. Vascular/Lymphatic: Atherosclerotic calcification is present within the non-aneurysmal abdominal aorta, without hemodynamically significant stenosis. --No retroperitoneal lymphadenopathy. --No mesenteric lymphadenopathy. --No pelvic or inguinal lymphadenopathy. Reproductive: Unremarkable Other: No ascites or free air. The abdominal wall is  normal. Musculoskeletal. No acute displaced fractures. IMPRESSION: 1. Diffuse wall thickening of the gallbladder with adjacent fat stranding, concerning for acute cholecystitis. 2. There is a potential 7 mm stone in the distal common bile duct. There is no significant intrahepatic biliary ductal dilatation. 3. Heterogeneous hepatic steatosis. 4. Nonobstructing right nephrolithiasis. Aortic Atherosclerosis (ICD10-I70.0). Electronically Signed   By: Constance Holster M.D.   On: 07/29/2020 22:26   DG Chest 2 View  Result Date: 07/29/2020 CLINICAL DATA:  Cough. EXAM: CHEST - 2 VIEW COMPARISON:  08/14/2019 FINDINGS: Stable mildly enlarged cardiac silhouette. Clear lungs with normal vascularity. Thoracic spine degenerative changes. IMPRESSION: No acute abnormality. Electronically Signed   By: Claudie Revering M.D.   On: 07/29/2020 19:59   NM Hepato W/EF  Result Date: 07/30/2020 CLINICAL DATA:  RIGHT upper quadrant pain and nausea for 6 months EXAM: NUCLEAR MEDICINE HEPATOBILIARY IMAGING WITH GALLBLADDER EF TECHNIQUE: Sequential images of the abdomen were obtained out to 60 minutes following intravenous administration of radiopharmaceutical. After slow intravenous infusion of 1.89 micrograms Cholecystokinin, gallbladder ejection fraction was determined. RADIOPHARMACEUTICALS:  5.5 mCi Tc-70m Choletec IV COMPARISON:  CT abdomen and pelvis 07/29/2020, ultrasound abdomen 07/30/2020 FINDINGS: Normal tracer extraction from bloodstream indicating normal hepatocellular function. Normal excretion of tracer into biliary tree. Gallbladder visualized at 19 min. Small bowel visualized at 10 min. Small amount of duodenal again strict reflux of tracer/bile. No hepatic retention of  tracer. Subjectively normal emptying of tracer from gallbladder following CCK administration. Calculated gallbladder ejection fraction is 92%, normal. Patient reported some abdominal pain following CCK administration. Normal gallbladder ejection fraction  following CCK stimulation is greater than 40% at 1 hour. IMPRESSION: Patent biliary tree with normal gallbladder ejection fraction of 92% following CCK administration. No evidence of cystic duct or common bile duct obstruction. Electronically Signed   By: Lavonia Dana M.D.   On: 07/30/2020 12:10   ECHOCARDIOGRAM COMPLETE  Result Date: 07/25/2020    ECHOCARDIOGRAM REPORT   Patient Name:   Danny Cook. Date of Exam: 07/25/2020 Medical Rec #:  [id number]                 Height:       67.0 in Accession #:    [id number]                Weight:       214.0 lb Date of Birth:  10-15-1960                 BSA:          2.081 m Patient Age:    61 years                  BP:           118/75 mmHg Patient Gender: M                         HR:           74 bpm. Exam Location:  [facility] Procedure: 2D Echo, Color Doppler, Cardiac Doppler and Strain Analysis Indications:     I50.9 Congestive Heart Failure  History:         Patient has prior history of Echocardiogram examinations. TIA                  and CKD; Risk Factors:Sleep Apnea, Hypertension and Diabetes.  Sonographer:     Charmayne Sheer RDCS (AE) Referring Phys:  [id number] Alisa Graff Diagnosing Phys: Bartholome Bill MD  Sonographer Comments: Image acquisition challenging due to patient body habitus. Global longitudinal strain was attempted. IMPRESSIONS  1. Left ventricular ejection fraction, by estimation, is 55 to 60%. The left ventricle has normal function. The left ventricle has no regional wall motion abnormalities. There is mild left ventricular hypertrophy. Left ventricular diastolic parameters are consistent with Grade I diastolic dysfunction (impaired relaxation).  2. Right ventricular systolic function is normal. The right ventricular size is normal.  3. The mitral valve is grossly normal. Trivial mitral valve regurgitation.  4. The aortic valve is grossly normal. Aortic valve regurgitation is trivial. No aortic stenosis is present.  5. Aortic dilatation noted.  There is borderline dilatation of the aortic root, measuring 36 mm. FINDINGS  Left Ventricle: Left ventricular ejection fraction, by estimation, is 55 to 60%. The left ventricle has normal function. The left ventricle has no regional wall motion abnormalities. The left ventricular internal cavity size was normal in size. There is  mild left ventricular hypertrophy. Left ventricular diastolic parameters are consistent with Grade I diastolic dysfunction (impaired relaxation). Right Ventricle: The right ventricular size is normal. No increase in right ventricular wall thickness. Right ventricular systolic function is normal. Left Atrium: Left atrial size was normal in size. Right Atrium: Right atrial size was normal in size. Pericardium: There is no evidence of pericardial effusion. Mitral Valve: The mitral valve is  grossly normal. Trivial mitral valve regurgitation. MV peak gradient, 2.4 mmHg. The mean mitral valve gradient is 1.0 mmHg. Tricuspid Valve: The tricuspid valve is grossly normal. Tricuspid valve regurgitation is trivial. Aortic Valve: The aortic valve is grossly normal. Aortic valve regurgitation is trivial. No aortic stenosis is present. Aortic valve mean gradient measures 5.0 mmHg. Aortic valve peak gradient measures 8.3 mmHg. Aortic valve area, by VTI measures 3.05 cm. Pulmonic Valve: The pulmonic valve was not well visualized. Pulmonic valve regurgitation is not visualized. Aorta: Aortic dilatation noted. There is borderline dilatation of the aortic root, measuring 36 mm. IAS/Shunts: No atrial level shunt detected by color flow Doppler.  LEFT VENTRICLE PLAX 2D LVIDd:         3.70 cm  Diastology LVIDs:         2.60 cm  LV e' medial:    6.09 cm/s LV PW:         1.70 cm  LV E/e' medial:  10.4 LV IVS:        1.10 cm  LV e' lateral:   11.60 cm/s LVOT diam:     2.20 cm  LV E/e' lateral: 5.5 LV SV:         83 LV SV Index:   40 LVOT Area:     3.80 cm  RIGHT VENTRICLE RV Basal diam:  2.80 cm LEFT ATRIUM              Index       RIGHT ATRIUM           Index LA diam:        3.60 cm 1.73 cm/m  RA Area:     10.40 cm LA Vol (A2C):   39.8 ml 19.12 ml/m RA Volume:   18.30 ml  8.79 ml/m LA Vol (A4C):   30.0 ml 14.41 ml/m LA Biplane Vol: 35.4 ml 17.01 ml/m  AORTIC VALVE                    PULMONIC VALVE AV Area (Vmax):    3.06 cm     PV Vmax:       1.32 m/s AV Area (Vmean):   2.84 cm     PV Vmean:      82.900 cm/s AV Area (VTI):     3.05 cm     PV VTI:        0.186 m AV Vmax:           144.00 cm/s  PV Peak grad:  7.0 mmHg AV Vmean:          104.000 cm/s PV Mean grad:  3.0 mmHg AV VTI:            0.273 m AV Peak Grad:      8.3 mmHg AV Mean Grad:      5.0 mmHg LVOT Vmax:         116.00 cm/s LVOT Vmean:        77.700 cm/s LVOT VTI:          0.219 m LVOT/AV VTI ratio: 0.80  AORTA Ao Root diam: 3.60 cm MITRAL VALVE MV Area (PHT): 3.12 cm    SHUNTS MV Area VTI:   3.51 cm    Systemic VTI:  0.22 m MV Peak grad:  2.4 mmHg    Systemic Diam: 2.20 cm MV Mean grad:  1.0 mmHg MV Vmax:       0.77 m/s MV Vmean:      48.0  cm/s MV Decel Time: 243 msec MV E velocity: 63.40 cm/s MV A velocity: 56.10 cm/s MV E/A ratio:  1.13 Bartholome Bill MD Electronically signed by Bartholome Bill MD Signature Date/Time: 07/25/2020/4:59:06 PM    Final    US Abdomen Limited RUQ (LIVER/GB)  Result Date: 07/30/2020 CLINICAL DATA:  Choledocholithiasis by CT, history diabetes mellitus, hypertension, HIV, Kaposi sarcoma, CHF EXAM: ULTRASOUND ABDOMEN LIMITED RIGHT UPPER QUADRANT COMPARISON:  CT abdomen and pelvis 07/29/2020 FINDINGS: Gallbladder: Well distended with mild gallbladder wall thickening. No pericholecystic fluid or sonographic Murphy sign. No definite shadowing calculi visualized. Common bile duct: Diameter: 5 mm, normal Liver: Normal echogenicity without mass or nodularity. No intrahepatic biliary dilatation. Portal vein is patent on color Doppler imaging with normal direction of blood flow towards the liver. Other: No RIGHT upper quadrant free fluid.  IMPRESSION: Mild nonspecific gallbladder wall thickening. No evidence of cholelithiasis or biliary dilatation. Remainder of exam unremarkable. Electronically Signed   By: Lavonia Dana M.D.   On: 07/30/2020 10:07         Discharge Exam: Vitals:   07/31/20 0900 07/31/20 0905  BP: 108/64 127/70  Pulse: (!) 59 75  Resp: 17 19  Temp:  97.8 F (36.6 C)  SpO2:  95%   Vitals:   07/31/20 0630 07/31/20 0800 07/31/20 0900 07/31/20 0905  BP: (!) 99/57 108/66 108/64 127/70  Pulse: (!) 56 60 (!) 59 75  Resp: 15 15 17 19   Temp:    97.8 F (36.6 C)  TempSrc:      SpO2: 96%   95%  Weight:      Height:        General: Pt is alert, awake, not in acute distress Cardiovascular: RRR, S1/S2 +, no rubs, no gallops Respiratory: CTA bilaterally, no wheezing, no rhonchi Abdominal: Soft, NT, ND, bowel sounds + Extremities: no edema, no cyanosis   The results of significant diagnostics from this hospitalization (including imaging, microbiology, ancillary and laboratory) are listed below for reference.    Significant Diagnostic Studies: CT ABDOMEN PELVIS WO CONTRAST  Result Date: 07/29/2020 CLINICAL DATA:  Abdominal distension. EXAM: CT ABDOMEN AND PELVIS WITHOUT CONTRAST TECHNIQUE: Multidetector CT imaging of the abdomen and pelvis was performed following the standard protocol without IV contrast. COMPARISON:  CT dated 08/15/2019 FINDINGS: Lower chest: The lung bases are clear. The heart size is normal. Hepatobiliary: There appears to be heterogeneous hepatic steatosis. There is diffuse wall thickening of the gallbladder with adjacent fat stranding. There is a potential stone in the distal common bile duct measuring approximately 7 mm (axial series 3, image 37.There is no significant intrahepatic biliary ductal dilatation. Pancreas: Normal contours without ductal dilatation. No peripancreatic fluid collection. Spleen: Unremarkable. Adrenals/Urinary Tract: --Adrenal glands: There is a stable left adrenal  mass. --Right kidney/ureter: There is a nonobstructing stone in the upper pole the right kidney measuring approximately 1.1 cm. --Left kidney/ureter: No hydronephrosis or radiopaque kidney stones. --Urinary bladder: Unremarkable. Stomach/Bowel: --Stomach/Duodenum: No hiatal hernia or other gastric abnormality. Normal duodenal course and caliber. --Small bowel: Unremarkable. --Colon: Unremarkable. --Appendix: Normal. Vascular/Lymphatic: Atherosclerotic calcification is present within the non-aneurysmal abdominal aorta, without hemodynamically significant stenosis. --No retroperitoneal lymphadenopathy. --No mesenteric lymphadenopathy. --No pelvic or inguinal lymphadenopathy. Reproductive: Unremarkable Other: No ascites or free air. The abdominal wall is normal. Musculoskeletal. No acute displaced fractures. IMPRESSION: 1. Diffuse wall thickening of the gallbladder with adjacent fat stranding, concerning for acute cholecystitis. 2. There is a potential 7 mm stone in the distal common bile duct. There is  no significant intrahepatic biliary ductal dilatation. 3. Heterogeneous hepatic steatosis. 4. Nonobstructing right nephrolithiasis. Aortic Atherosclerosis (ICD10-I70.0). Electronically Signed   By: Constance Holster M.D.   On: 07/29/2020 22:26   DG Chest 2 View  Result Date: 07/29/2020 CLINICAL DATA:  Cough. EXAM: CHEST - 2 VIEW COMPARISON:  08/14/2019 FINDINGS: Stable mildly enlarged cardiac silhouette. Clear lungs with normal vascularity. Thoracic spine degenerative changes. IMPRESSION: No acute abnormality. Electronically Signed   By: Claudie Revering M.D.   On: 07/29/2020 19:59   NM Hepato W/EF  Result Date: 07/30/2020 CLINICAL DATA:  RIGHT upper quadrant pain and nausea for 6 months EXAM: NUCLEAR MEDICINE HEPATOBILIARY IMAGING WITH GALLBLADDER EF TECHNIQUE: Sequential images of the abdomen were obtained out to 60 minutes following intravenous administration of radiopharmaceutical. After slow intravenous  infusion of 1.89 micrograms Cholecystokinin, gallbladder ejection fraction was determined. RADIOPHARMACEUTICALS:  5.5 mCi Tc-20m Choletec IV COMPARISON:  CT abdomen and pelvis 07/29/2020, ultrasound abdomen 07/30/2020 FINDINGS: Normal tracer extraction from bloodstream indicating normal hepatocellular function. Normal excretion of tracer into biliary tree. Gallbladder visualized at 19 min. Small bowel visualized at 10 min. Small amount of duodenal again strict reflux of tracer/bile. No hepatic retention of tracer. Subjectively normal emptying of tracer from gallbladder following CCK administration. Calculated gallbladder ejection fraction is 92%, normal. Patient reported some abdominal pain following CCK administration. Normal gallbladder ejection fraction following CCK stimulation is greater than 40% at 1 hour. IMPRESSION: Patent biliary tree with normal gallbladder ejection fraction of 92% following CCK administration. No evidence of cystic duct or common bile duct obstruction. Electronically Signed   By: Lavonia Dana M.D.   On: 07/30/2020 12:10   ECHOCARDIOGRAM COMPLETE  Result Date: 07/25/2020    ECHOCARDIOGRAM REPORT   Patient Name:   Danny Cook. Date of Exam: 07/25/2020 Medical Rec #:  [id number]                 Height:       67.0 in Accession #:    [id number]                Weight:       214.0 lb Date of Birth:  [birth year]                 BSA:          2.081 m Patient Age:    58 years                  BP:           118/75 mmHg Patient Gender: M                         HR:           74 bpm. Exam Location:  [facility] Procedure: 2D Echo, Color Doppler, Cardiac Doppler and Strain Analysis Indications:     I50.9 Congestive Heart Failure  History:         Patient has prior history of Echocardiogram examinations. TIA                  and CKD; Risk Factors:Sleep Apnea, Hypertension and Diabetes.  Sonographer:     Charmayne Sheer RDCS (AE) Referring Phys:  [id number] Alisa Graff Diagnosing Phys: Bartholome Bill MD   Sonographer Comments: Image acquisition challenging due to patient body habitus. Global longitudinal strain was attempted. IMPRESSIONS  1. Left ventricular ejection fraction, by estimation, is 55 to 60%. The left ventricle  has normal function. The left ventricle has no regional wall motion abnormalities. There is mild left ventricular hypertrophy. Left ventricular diastolic parameters are consistent with Grade I diastolic dysfunction (impaired relaxation).  2. Right ventricular systolic function is normal. The right ventricular size is normal.  3. The mitral valve is grossly normal. Trivial mitral valve regurgitation.  4. The aortic valve is grossly normal. Aortic valve regurgitation is trivial. No aortic stenosis is present.  5. Aortic dilatation noted. There is borderline dilatation of the aortic root, measuring 36 mm. FINDINGS  Left Ventricle: Left ventricular ejection fraction, by estimation, is 55 to 60%. The left ventricle has normal function. The left ventricle has no regional wall motion abnormalities. The left ventricular internal cavity size was normal in size. There is  mild left ventricular hypertrophy. Left ventricular diastolic parameters are consistent with Grade I diastolic dysfunction (impaired relaxation). Right Ventricle: The right ventricular size is normal. No increase in right ventricular wall thickness. Right ventricular systolic function is normal. Left Atrium: Left atrial size was normal in size. Right Atrium: Right atrial size was normal in size. Pericardium: There is no evidence of pericardial effusion. Mitral Valve: The mitral valve is grossly normal. Trivial mitral valve regurgitation. MV peak gradient, 2.4 mmHg. The mean mitral valve gradient is 1.0 mmHg. Tricuspid Valve: The tricuspid valve is grossly normal. Tricuspid valve regurgitation is trivial. Aortic Valve: The aortic valve is grossly normal. Aortic valve regurgitation is trivial. No aortic stenosis is present. Aortic valve mean  gradient measures 5.0 mmHg. Aortic valve peak gradient measures 8.3 mmHg. Aortic valve area, by VTI measures 3.05 cm. Pulmonic Valve: The pulmonic valve was not well visualized. Pulmonic valve regurgitation is not visualized. Aorta: Aortic dilatation noted. There is borderline dilatation of the aortic root, measuring 36 mm. IAS/Shunts: No atrial level shunt detected by color flow Doppler.  LEFT VENTRICLE PLAX 2D LVIDd:         3.70 cm  Diastology LVIDs:         2.60 cm  LV e' medial:    6.09 cm/s LV PW:         1.70 cm  LV E/e' medial:  10.4 LV IVS:        1.10 cm  LV e' lateral:   11.60 cm/s LVOT diam:     2.20 cm  LV E/e' lateral: 5.5 LV SV:         83 LV SV Index:   40 LVOT Area:     3.80 cm  RIGHT VENTRICLE RV Basal diam:  2.80 cm LEFT ATRIUM             Index       RIGHT ATRIUM           Index LA diam:        3.60 cm 1.73 cm/m  RA Area:     10.40 cm LA Vol (A2C):   39.8 ml 19.12 ml/m RA Volume:   18.30 ml  8.79 ml/m LA Vol (A4C):   30.0 ml 14.41 ml/m LA Biplane Vol: 35.4 ml 17.01 ml/m  AORTIC VALVE                    PULMONIC VALVE AV Area (Vmax):    3.06 cm     PV Vmax:       1.32 m/s AV Area (Vmean):   2.84 cm     PV Vmean:      82.900 cm/s AV Area (VTI):  3.05 cm     PV VTI:        0.186 m AV Vmax:           144.00 cm/s  PV Peak grad:  7.0 mmHg AV Vmean:          104.000 cm/s PV Mean grad:  3.0 mmHg AV VTI:            0.273 m AV Peak Grad:      8.3 mmHg AV Mean Grad:      5.0 mmHg LVOT Vmax:         116.00 cm/s LVOT Vmean:        77.700 cm/s LVOT VTI:          0.219 m LVOT/AV VTI ratio: 0.80  AORTA Ao Root diam: 3.60 cm MITRAL VALVE MV Area (PHT): 3.12 cm    SHUNTS MV Area VTI:   3.51 cm    Systemic VTI:  0.22 m MV Peak grad:  2.4 mmHg    Systemic Diam: 2.20 cm MV Mean grad:  1.0 mmHg MV Vmax:       0.77 m/s MV Vmean:      48.0 cm/s MV Decel Time: 243 msec MV E velocity: 63.40 cm/s MV A velocity: 56.10 cm/s MV E/A ratio:  1.13 Bartholome Bill MD Electronically signed by Bartholome Bill MD Signature  Date/Time: 07/25/2020/4:59:06 PM    Final    US Abdomen Limited RUQ (LIVER/GB)  Result Date: 07/30/2020 CLINICAL DATA:  Choledocholithiasis by CT, history diabetes mellitus, hypertension, HIV, Kaposi sarcoma, CHF EXAM: ULTRASOUND ABDOMEN LIMITED RIGHT UPPER QUADRANT COMPARISON:  CT abdomen and pelvis 07/29/2020 FINDINGS: Gallbladder: Well distended with mild gallbladder wall thickening. No pericholecystic fluid or sonographic Murphy sign. No definite shadowing calculi visualized. Common bile duct: Diameter: 5 mm, normal Liver: Normal echogenicity without mass or nodularity. No intrahepatic biliary dilatation. Portal vein is patent on color Doppler imaging with normal direction of blood flow towards the liver. Other: No RIGHT upper quadrant free fluid. IMPRESSION: Mild nonspecific gallbladder wall thickening. No evidence of cholelithiasis or biliary dilatation. Remainder of exam unremarkable. Electronically Signed   By: Lavonia Dana M.D.   On: 07/30/2020 10:07     Microbiology: Recent Results (from the past 240 hour(s))  SARS Coronavirus 2 by RT PCR (hospital order, performed in [facility] hospital lab) Nasopharyngeal Nasopharyngeal Swab     Status: None   Collection Time: 07/29/20  7:30 PM   Specimen: Nasopharyngeal Swab  Result Value Ref Range Status   SARS Coronavirus 2 NEGATIVE NEGATIVE Final    Comment: (NOTE) SARS-CoV-2 target nucleic acids are NOT DETECTED.  The SARS-CoV-2 RNA is generally detectable in upper and lower respiratory specimens during the acute phase of infection. The lowest concentration of SARS-CoV-2 viral copies this assay can detect is 250 copies / mL. A negative result does not preclude SARS-CoV-2 infection and should not be used as the sole basis for treatment or other patient management decisions.  A negative result may occur with improper specimen collection / handling, submission of specimen other than nasopharyngeal swab, presence of viral mutation(s) within  the areas targeted by this assay, and inadequate number of viral copies (<250 copies / mL). A negative result must be combined with clinical observations, patient history, and epidemiological information.  Fact Sheet for Patients:   StrictlyIdeas.no  Fact Sheet for Healthcare Providers: BankingDealers.co.za  This test is not yet approved or  cleared by the [place] FDA and has been authorized for detection  and/or diagnosis of SARS-CoV-2 by FDA under an Emergency Use Authorization (EUA).  This EUA will remain in effect (meaning this test can be used) for the duration of the COVID-19 declaration under Section 564(b)(1) of the Act, 21 U.S.C. section 360bbb-3(b)(1), unless the authorization is terminated or revoked sooner.  Performed at [facility], [address], [place], [place] [address]   Blood culture (routine x 2)     Status: None (Preliminary result)   Collection Time: 07/29/20  8:15 PM   Specimen: BLOOD  Result Value Ref Range Status   Specimen Description BLOOD BLOOD LEFT WRIST  Final   Special Requests   Final    Blood Culture adequate volume BOTTLES DRAWN AEROBIC AND ANAEROBIC   Culture   Final    NO GROWTH 2 DAYS Performed at [facility], [address] [place], [place] [address]    Report Status PENDING  Incomplete  Blood culture (routine x 2)     Status: None (Preliminary result)   Collection Time: 07/29/20  8:15 PM   Specimen: BLOOD  Result Value Ref Range Status   Specimen Description BLOOD BLOOD LEFT HAND  Final   Special Requests   Final    Blood Culture adequate volume BOTTLES DRAWN AEROBIC AND ANAEROBIC   Culture   Final    NO GROWTH 2 DAYS Performed at [facility], [address] [place], [place] [address]    Report Status PENDING  Incomplete     Labs: Basic Metabolic Panel: Recent Labs  Lab 07/29/20 2032 07/30/20 0524 07/31/20 0706  NA 138 138 140  K 3.5 3.4* 4.1  CL 106 108 108  CO2 20*  22 26  GLUCOSE 60* 82 131*  BUN 24* 24* 15  CREATININE 2.30* 1.85* 1.16  CALCIUM 8.6* 8.1* 9.0  MG  --  1.6* 1.8  PHOS  --  3.7  --    Liver Function Tests: Recent Labs  Lab 07/29/20 2032 07/30/20 0524 07/31/20 0706  AST 15 12* 17  ALT 17 16 17   ALKPHOS 68 60 69  BILITOT 0.4 0.5 0.5  PROT 6.8 6.2* 6.9  ALBUMIN 3.5 3.1* 3.3*   Recent Labs  Lab 07/30/20 0524  LIPASE 29   No results for input(s): AMMONIA in the last 168 hours. CBC: Recent Labs  Lab 07/29/20 2032 07/30/20 0524 07/31/20 0706  WBC 8.6 6.6 6.9  NEUTROABS 3.8  --   --   HGB 11.8* 11.4* 11.9*  HCT 35.8* 34.1* 36.0*  MCV 97.8 97.7 98.1  PLT 192 166 196   Cardiac Enzymes: No results for input(s): CKTOTAL, CKMB, CKMBINDEX, TROPONINI in the last 168 hours. BNP: Invalid input(s): POCBNP CBG: Recent Labs  Lab 07/30/20 0354 07/30/20 2217 07/31/20 0120 07/31/20 0549 07/31/20 1059  GLUCAP 107* 130* 131* 122* 233*    Time coordinating discharge:  36 minutes  Signed:  Orson Eva, DO [facility] Pager: [phone] 07/31/2020, 12:26 PM

## 2020-07-31 NOTE — OR Nursing (Signed)
Patient discharged via EMS. Patient denies any complaints and pain at discharge.

## 2020-07-31 NOTE — Progress Notes (Signed)
Blood glucose level: 131

## 2020-07-31 NOTE — Progress Notes (Signed)
[facility] Progress Note     Subjective: No pain and tolerated diet. HIDA negative for cholecystitis and no stones on [place]. CT with possible duodenal diverticula.   Objective: Vital signs in last 24 hours: Temp:  [97.8 F (36.6 C)-99 F (37.2 C)] 97.8 F (36.6 C) (02/01 0905) Pulse Rate:  [56-75] 75 (02/01 0905) Resp:  [10-19] 19 (02/01 0905) BP: (86-141)/(47-73) 127/70 (02/01 0905) SpO2:  [93 %-97 %] 95 % (02/01 0905) Last BM Date: 07/30/20  Intake/Output from previous day: 01/31 0701 - 02/01 0700 In: 1100 [I.V.:1000; IV Piggyback:100] Out: 1950 [Urine:1950] Intake/Output this shift: Total I/O In: 150 [P.O.:150] Out: 275 [Urine:275]  General appearance: alert, cooperative and no distress Resp: normal work of breathing GI: soft, nondistended, nontender  Lab Results:  Recent Labs    07/30/20 0524 07/31/20 0706  WBC 6.6 6.9  HGB 11.4* 11.9*  HCT 34.1* 36.0*  PLT 166 196   BMET Recent Labs    07/30/20 0524 07/31/20 0706  NA 138 140  K 3.4* 4.1  CL 108 108  CO2 22 26  GLUCOSE 82 131*  BUN 24* 15  CREATININE 1.85* 1.16  CALCIUM 8.1* 9.0   PT/INR Recent Labs    07/30/20 0524  LABPROT 12.8  INR 1.0    Studies/Results: CT ABDOMEN PELVIS WO CONTRAST  Result Date: 07/29/2020 CLINICAL DATA:  Abdominal distension. EXAM: CT ABDOMEN AND PELVIS WITHOUT CONTRAST TECHNIQUE: Multidetector CT imaging of the abdomen and pelvis was performed following the standard protocol without IV contrast. COMPARISON:  CT dated 08/15/2019 FINDINGS: Lower chest: The lung bases are clear. The heart size is normal. Hepatobiliary: There appears to be heterogeneous hepatic steatosis. There is diffuse wall thickening of the gallbladder with adjacent fat stranding. There is a potential stone in the distal common bile duct measuring approximately 7 mm (axial series 3, image 37.There is no significant intrahepatic biliary ductal dilatation. Pancreas: Normal contours without  ductal dilatation. No peripancreatic fluid collection. Spleen: Unremarkable. Adrenals/Urinary Tract: --Adrenal glands: There is a stable left adrenal mass. --Right kidney/ureter: There is a nonobstructing stone in the upper pole the right kidney measuring approximately 1.1 cm. --Left kidney/ureter: No hydronephrosis or radiopaque kidney stones. --Urinary bladder: Unremarkable. Stomach/Bowel: --Stomach/Duodenum: No hiatal hernia or other gastric abnormality. Normal duodenal course and caliber. --Small bowel: Unremarkable. --Colon: Unremarkable. --Appendix: Normal. Vascular/Lymphatic: Atherosclerotic calcification is present within the non-aneurysmal abdominal aorta, without hemodynamically significant stenosis. --No retroperitoneal lymphadenopathy. --No mesenteric lymphadenopathy. --No pelvic or inguinal lymphadenopathy. Reproductive: Unremarkable Other: No ascites or free air. The abdominal wall is normal. Musculoskeletal. No acute displaced fractures. IMPRESSION: 1. Diffuse wall thickening of the gallbladder with adjacent fat stranding, concerning for acute cholecystitis. 2. There is a potential 7 mm stone in the distal common bile duct. There is no significant intrahepatic biliary ductal dilatation. 3. Heterogeneous hepatic steatosis. 4. Nonobstructing right nephrolithiasis. Aortic Atherosclerosis (ICD10-I70.0). Electronically Signed   By: Constance Holster M.D.   On: 07/29/2020 22:26   DG Chest 2 View  Result Date: 07/29/2020 CLINICAL DATA:  Cough. EXAM: CHEST - 2 VIEW COMPARISON:  08/14/2019 FINDINGS: Stable mildly enlarged cardiac silhouette. Clear lungs with normal vascularity. Thoracic spine degenerative changes. IMPRESSION: No acute abnormality. Electronically Signed   By: Claudie Revering M.D.   On: 07/29/2020 19:59   NM Hepato W/EF  Result Date: 07/30/2020 CLINICAL DATA:  RIGHT upper quadrant pain and nausea for 6 months EXAM: NUCLEAR MEDICINE HEPATOBILIARY IMAGING WITH GALLBLADDER EF TECHNIQUE:  Sequential images of the abdomen were obtained  out to 60 minutes following intravenous administration of radiopharmaceutical. After slow intravenous infusion of 1.89 micrograms Cholecystokinin, gallbladder ejection fraction was determined. RADIOPHARMACEUTICALS:  5.5 mCi Tc-26m Choletec IV COMPARISON:  CT abdomen and pelvis 07/29/2020, ultrasound abdomen 07/30/2020 FINDINGS: Normal tracer extraction from bloodstream indicating normal hepatocellular function. Normal excretion of tracer into biliary tree. Gallbladder visualized at 19 min. Small bowel visualized at 10 min. Small amount of duodenal again strict reflux of tracer/bile. No hepatic retention of tracer. Subjectively normal emptying of tracer from gallbladder following CCK administration. Calculated gallbladder ejection fraction is 92%, normal. Patient reported some abdominal pain following CCK administration. Normal gallbladder ejection fraction following CCK stimulation is greater than 40% at 1 hour. IMPRESSION: Patent biliary tree with normal gallbladder ejection fraction of 92% following CCK administration. No evidence of cystic duct or common bile duct obstruction. Electronically Signed   By: Lavonia Dana M.D.   On: 07/30/2020 12:10   US Abdomen Limited RUQ (LIVER/GB)  Result Date: 07/30/2020 CLINICAL DATA:  Choledocholithiasis by CT, history diabetes mellitus, hypertension, HIV, Kaposi sarcoma, CHF EXAM: ULTRASOUND ABDOMEN LIMITED RIGHT UPPER QUADRANT COMPARISON:  CT abdomen and pelvis 07/29/2020 FINDINGS: Gallbladder: Well distended with mild gallbladder wall thickening. No pericholecystic fluid or sonographic Murphy sign. No definite shadowing calculi visualized. Common bile duct: Diameter: 5 mm, normal Liver: Normal echogenicity without mass or nodularity. No intrahepatic biliary dilatation. Portal vein is patent on color Doppler imaging with normal direction of blood flow towards the liver. Other: No RIGHT upper quadrant free fluid. IMPRESSION:  Mild nonspecific gallbladder wall thickening. No evidence of cholelithiasis or biliary dilatation. Remainder of exam unremarkable. Electronically Signed   By: Lavonia Dana M.D.   On: 07/30/2020 10:07    Anti-infectives: Anti-infectives (From admission, onward)   Start     Dose/Rate Route Frequency Ordered Stop   07/30/20 2200  abacavir-dolutegravir-lamiVUDine (TRIUMEQ) 600-50-300 MG per tablet 1 tablet        1 tablet Oral Daily at bedtime 07/30/20 1619     07/30/20 0600  piperacillin-tazobactam (ZOSYN) IVPB 3.375 g  Status:  Discontinued        3.375 g 100 mL/hr over 30 Minutes Intravenous Every 6 hours 07/30/20 0400 07/30/20 0403   07/30/20 0600  piperacillin-tazobactam (ZOSYN) IVPB 3.375 g        3.375 g 12.5 mL/hr over 240 Minutes Intravenous Every 8 hours 07/30/20 0403     07/29/20 2300  piperacillin-tazobactam (ZOSYN) IVPB 3.375 g        3.375 g 100 mL/hr over 30 Minutes Intravenous  Once 07/29/20 2246 07/29/20 2331   07/29/20 2015  cefTRIAXone (ROCEPHIN) 1 g in sodium chloride 0.9 % 100 mL IVPB        1 g 200 mL/hr over 30 Minutes Intravenous  Once 07/29/20 2003 07/29/20 2227      Assessment/Plan: [name] with abdominal pain. Now resolved and tolerating. No gallbladder pathology.  PRN follow up GI seeing for endoscopy as outpatient    LOS: 2 days    Virl Cagey 07/31/2020

## 2020-07-31 NOTE — TOC Progression Note (Signed)
Pt stable for return to his group home, [facility], today per MD. Damaris Schooner with group home owner [facility] to update. Tommie Sams states pt can return today and she does not need a new FL2. Updated RN. DC summary will be sent along with pt's dc paperwork. EMS arranged. No other TOC needs for dc.

## 2020-07-31 NOTE — Progress Notes (Signed)
Blood glucose Level: 122

## 2020-08-02 ENCOUNTER — Ambulatory Visit: Payer: Medicare Other | Admitting: Family

## 2020-08-03 LAB — CULTURE, BLOOD (ROUTINE X 2)
Culture: NO GROWTH
Culture: NO GROWTH
Special Requests: ADEQUATE
Special Requests: ADEQUATE

## 2020-08-16 NOTE — Progress Notes (Deleted)
Patient ID: [name]., male    DOB: 08/12/1960, 60 y.o.   MRN: [id number]  HPI  Mr Youkhana is a 60 y/o male with a history of DM, hyperlipidemia, HTN, CKD, HIV, schizophrenia, obstructive sleep apnea, anxiety, osteoarthritis and chronic heart failure.   Echo report from 07/25/20 reviewed and showed an EF of 55-60% along with mild LVH. Echo report from 06/08/18 reviewed and showed an EF of 55-60%. Echo report from 03/25/18 reviewed and showed an EF of 55-60%. Echo report from 07/31/17 reviewed and showed an EF of 55-65%. Echo report from 05/11/17 reviewed and showed an EF of 55% along with mild AR/ TR/ MR.  Admitted 07/29/20 due to lower abdominal pain and nausea. Initially hypotensive. CT of the abdomen and pelvis showed diffuse gallbladder wall thickening with adjacent fat stranding. There was also a possible 7 mm common duct stone, but without any intrahepatic biliary ductal dilatation. GI and surgical consults obtained. IV antibiotics and IVF given. HIDA scan negative. Losartan and metoprolol held due to soft BP. Discharged after 2 days.      He presents today for a follow-up visit with a chief complaint of   Past Medical History:  Diagnosis Date  . Anemia   . Anxiety   . Arthritis   . CHF (congestive heart failure) ([place])   . Chronic kidney disease    Renal Insufficiency Syndrome; Glomerulosclerosis 2013  . Depressed   . Diabetes mellitus without complication ([place])   . GERD (gastroesophageal reflux disease)   . High cholesterol   . HIV (human immunodeficiency virus infection) ([place])   . Hypertension   . 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 Paranoid disorder ([place])   . Pneumonia   . Schizophrenia, paranoid ([place])   . Sleep apnea   . TIA (transient ischemic attack)    Past Surgical History:  Procedure Laterality Date  . CARDIAC CATHETERIZATION    . COLONOSCOPY WITH PROPOFOL N/A 09/03/2015   Procedure: COLONOSCOPY WITH PROPOFOL;  Surgeon: Lollie Sails, MD;  Location: [facility]  ENDOSCOPY;  Service: Endoscopy;  Laterality: N/A;  . DG TEETH FULL    . ESOPHAGOGASTRODUODENOSCOPY (EGD) WITH PROPOFOL N/A 07/01/2016   Dr. [name], [facility]. abnormal esophageal motility, suspicious for presbyesophagus, bile gastritis s/p biopsy, non-bleeding erosive gastropathy s/p biopsy, normal duodenum, path with negative H.pylori and +chronic active gastritis   Family History  Problem Relation Age of Onset  . Heart attack Mother   . Diabetes Mellitus II Mother   . Mental illness Mother   . CAD Mother   . Heart attack Father   . CAD Father   . Hypertension Father    Social History   Tobacco Use  . Smoking status: Never Smoker  . Smokeless tobacco: Never Used  Substance Use Topics  . Alcohol use: No   Allergies  Allergen Reactions  . Trazodone And Nefazodone     Affects other meds that they dont work  . Trazodone Hcl Other (See Comments)  . Viread [Tenofovir Disoproxil]     Damages kidneys  . Etodolac Rash     Review of Systems  Constitutional: Positive for fatigue. Negative for appetite change.  HENT: Positive for rhinorrhea. Negative for congestion, postnasal drip and sore throat.   Eyes: Negative.   Respiratory: Positive for shortness of breath. Negative for cough and chest tightness.   Cardiovascular: Positive for leg swelling. Negative for chest pain and palpitations.  Gastrointestinal: Negative for abdominal distention and abdominal pain.  Endocrine: Negative.  Genitourinary: Negative.   Musculoskeletal: Positive for back pain (when standing for long periods of time). Negative for neck pain.  Skin: Negative.   Allergic/Immunologic: Negative.   Neurological: Positive for light-headedness (on occasion). Negative for dizziness.  Hematological: Negative for adenopathy. Does not bruise/bleed easily.  Psychiatric/Behavioral: Negative for dysphoric mood and sleep disturbance. The patient is nervous/anxious.      Physical Exam Vitals and nursing note  reviewed.  Constitutional:      Appearance: He is well-developed.  HENT:     Head: Normocephalic and atraumatic.  Neck:     Vascular: No JVD.  Cardiovascular:     Rate and Rhythm: Normal rate and regular rhythm.  Pulmonary:     Effort: Pulmonary effort is normal.     Breath sounds: No wheezing or rales.  Abdominal:     General: There is no distension.     Palpations: Abdomen is soft.     Tenderness: There is no abdominal tenderness.  Musculoskeletal:        General: No tenderness.     Cervical back: Normal range of motion and neck supple.     Right lower leg: No tenderness. Edema (2+ pitting) present.     Left lower leg: No tenderness. Edema (2+ pitting) present.  Skin:    General: Skin is warm and dry.  Neurological:     General: No focal deficit present.     Mental Status: He is alert and oriented to person, place, and time.  Psychiatric:        Mood and Affect: Mood normal.        Behavior: Behavior normal.        Thought Content: Thought content normal.    Assessment & Plan:  1: Chronic heart failure with preserved ejection fraction- - NYHA class II - euvolemic today - weighing daily; reminded to call for an overnight weight gain of >2 pounds or a weekly weight gain of >5 pounds - weight 214 pounds from last visit here 3 months ago - not adding salt to his food and doesn't think the food at the family care home tastes salty - saw cardiology Minette Brine) 01/26/20 - instructed to begin wearing his compression socks daily with removal at bedtime - BNP 07/29/20 was 31.0 - patient says that he's received all 3 of his COVID vaccines - reports receiving his flu vaccine for this season  2: HTN- - BP  - BMP from 08/03/20 reviewed and showed sodium 141, potassium 4.0, creatinine 1.5 and GFR 56 - saw PCP Ola Spurr) 08/03/20  3: Diabetes- - A1c 07/31/20 was 6.3% - saw endocrinologist Jenetta DownerDina Rich) 04/19/20 - saw nephrologist Radene Knee) 07/23/20 - glucose at the facility this morning  was    Facility medication list was reviewed.

## 2020-08-17 ENCOUNTER — Ambulatory Visit: Payer: Medicare Other | Admitting: Family

## 2020-08-29 ENCOUNTER — Encounter: Payer: Self-pay | Admitting: Family

## 2020-08-29 ENCOUNTER — Ambulatory Visit: Payer: Medicare Other | Attending: Family | Admitting: Family

## 2020-08-29 ENCOUNTER — Other Ambulatory Visit: Payer: Self-pay

## 2020-08-29 VITALS — BP 127/74 | HR 93 | Resp 18 | Ht 67.0 in | Wt 215.4 lb

## 2020-08-29 DIAGNOSIS — F419 Anxiety disorder, unspecified: Secondary | ICD-10-CM | POA: Diagnosis not present

## 2020-08-29 DIAGNOSIS — G8929 Other chronic pain: Secondary | ICD-10-CM | POA: Diagnosis not present

## 2020-08-29 DIAGNOSIS — Z7982 Long term (current) use of aspirin: Secondary | ICD-10-CM | POA: Insufficient documentation

## 2020-08-29 DIAGNOSIS — M549 Dorsalgia, unspecified: Secondary | ICD-10-CM | POA: Diagnosis not present

## 2020-08-29 DIAGNOSIS — R42 Dizziness and giddiness: Secondary | ICD-10-CM | POA: Diagnosis not present

## 2020-08-29 DIAGNOSIS — R0602 Shortness of breath: Secondary | ICD-10-CM | POA: Insufficient documentation

## 2020-08-29 DIAGNOSIS — I5032 Chronic diastolic (congestive) heart failure: Secondary | ICD-10-CM | POA: Diagnosis not present

## 2020-08-29 DIAGNOSIS — Z794 Long term (current) use of insulin: Secondary | ICD-10-CM | POA: Insufficient documentation

## 2020-08-29 DIAGNOSIS — E1122 Type 2 diabetes mellitus with diabetic chronic kidney disease: Secondary | ICD-10-CM | POA: Diagnosis not present

## 2020-08-29 DIAGNOSIS — Z8249 Family history of ischemic heart disease and other diseases of the circulatory system: Secondary | ICD-10-CM | POA: Insufficient documentation

## 2020-08-29 DIAGNOSIS — R5383 Other fatigue: Secondary | ICD-10-CM | POA: Insufficient documentation

## 2020-08-29 DIAGNOSIS — N189 Chronic kidney disease, unspecified: Secondary | ICD-10-CM | POA: Insufficient documentation

## 2020-08-29 DIAGNOSIS — N182 Chronic kidney disease, stage 2 (mild): Secondary | ICD-10-CM

## 2020-08-29 DIAGNOSIS — I10 Essential (primary) hypertension: Secondary | ICD-10-CM

## 2020-08-29 DIAGNOSIS — Z79899 Other long term (current) drug therapy: Secondary | ICD-10-CM | POA: Insufficient documentation

## 2020-08-29 DIAGNOSIS — I130 Hypertensive heart and chronic kidney disease with heart failure and stage 1 through stage 4 chronic kidney disease, or unspecified chronic kidney disease: Secondary | ICD-10-CM | POA: Diagnosis not present

## 2020-08-29 DIAGNOSIS — Z7951 Long term (current) use of inhaled steroids: Secondary | ICD-10-CM | POA: Insufficient documentation

## 2020-08-29 DIAGNOSIS — Z833 Family history of diabetes mellitus: Secondary | ICD-10-CM | POA: Diagnosis not present

## 2020-08-29 NOTE — Progress Notes (Signed)
Patient ID: Danny Cook., male    DOB: 1961/05/06, 60 y.o.   MRN: [id number]  HPI  Danny Cook is a 60 y/o male with a history of DM, hyperlipidemia, HTN, CKD, HIV, schizophrenia, obstructive sleep apnea, anxiety, osteoarthritis and chronic heart failure.   Echo report from 07/25/20 reviewed and showed an EF of 55-60% along with mild LVH. Echo report from 06/08/18 reviewed and showed an EF of 55-60%. Echo report from 03/25/18 reviewed and showed an EF of 55-60%. Echo report from 07/31/17 reviewed and showed an EF of 55-65%. Echo report from 05/11/17 reviewed and showed an EF of 55% along with mild AR/ TR/ Danny.  Admitted 07/29/20 due to lower abdominal pain and nausea. Initially hypotensive. CT of the abdomen and pelvis showed diffuse gallbladder wall thickening with adjacent fat stranding. There was also a possible 7 mm common duct stone, but without any intrahepatic biliary ductal dilatation. GI and surgical consults obtained. IV antibiotics and IVF given. HIDA scan negative. Losartan and metoprolol held due to soft BP. Discharged after 2 days.      He presents today for a follow-up visit with a chief complaint of minimal shortness of breath upon moderate exertion. He describes this as chronic in nature having been present for several years with varying levels of severity. He has associated fatigue, light-headedness, anxiety and chronic back pain along with this. He denies any difficulty sleeping, abdominal distention, palpitations, pedal edema, chest pain, cough or weight gain.   Wearing compression socks daily with improvement of edema. Has been walking once a week around the walking track with his peer support person and says that he's enjoying that.   Past Medical History:  Diagnosis Date  . Anemia   . Anxiety   . Arthritis   . CHF (congestive heart failure) ([place])   . Chronic kidney disease    Renal Insufficiency Syndrome; Glomerulosclerosis 2013  . Depressed   . Diabetes mellitus  without complication ([place])   . GERD (gastroesophageal reflux disease)   . High cholesterol   . HIV (human immunodeficiency virus infection) ([employer])   . Hypertension   . 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 Paranoid disorder ([place])   . Pneumonia   . Schizophrenia, paranoid ([place])   . Sleep apnea   . TIA (transient ischemic attack)    Past Surgical History:  Procedure Laterality Date  . CARDIAC CATHETERIZATION    . COLONOSCOPY WITH PROPOFOL N/A 09/03/2015   Procedure: COLONOSCOPY WITH PROPOFOL;  Surgeon: Danny Sails, Danny Cook;  Location: [facility] ENDOSCOPY;  Service: Endoscopy;  Laterality: N/A;  . DG TEETH FULL    . ESOPHAGOGASTRODUODENOSCOPY (EGD) WITH PROPOFOL N/A 07/01/2016   Dr. Gustavo Cook, [facility]. abnormal esophageal motility, suspicious for presbyesophagus, bile gastritis s/p biopsy, non-bleeding erosive gastropathy s/p biopsy, normal duodenum, path with negative H.pylori and +chronic active gastritis   Family History  Problem Relation Age of Onset  . Heart attack Mother   . Diabetes Mellitus II Mother   . Mental illness Mother   . CAD Mother   . Heart attack Father   . CAD Father   . Hypertension Father    Social History   Tobacco Use  . Smoking status: Never Smoker  . Smokeless tobacco: Never Used  Substance Use Topics  . Alcohol use: No   Allergies  Allergen Reactions  . Trazodone And Nefazodone     Affects other meds that they dont work  . Trazodone Hcl Other (See Comments)  . Viread [  Tenofovir Disoproxil]     Damages kidneys  . Etodolac Rash   Prior to Admission medications   Medication Sig Start Date End Date Taking? Authorizing Provider  abacavir-dolutegravir-lamiVUDine (TRIUMEQ) 600-50-300 MG tablet Take 1 tablet by mouth at bedtime. 07/21/19  Yes Danny Billing, Danny Cook  albuterol (PROVENTIL HFA;VENTOLIN HFA) 108 (90 Base) MCG/ACT inhaler Inhale 2 puffs into the lungs every 6 (six) hours as needed for wheezing or shortness of breath. 04/14/18  Yes Danny Grayer, Danny Cook   aspirin EC 325 MG tablet Take 325 mg by mouth daily.   Yes Provider, Historical, Danny Cook  butalbital-aspirin-caffeine Danny Cook) 50-325-40 MG capsule Take 1-2 capsules by mouth every 6 (six) hours as needed for headache.    Yes Provider, Historical, Danny Cook  cyanocobalamin 1000 MCG tablet Take 1,000 mcg by mouth daily.    Yes Provider, Historical, Danny Cook  diazepam (VALIUM) 5 MG tablet Take 5 mg by mouth 2 (two) times daily. And 2.5mg  mid day   Yes Provider, Historical, Danny Cook  diphenhydrAMINE (BENADRYL) 25 mg capsule Take 50 mg by mouth at bedtime.   Yes Provider, Historical, Danny Cook  divalproex (DEPAKOTE ER) 500 MG 24 hr tablet Take 500 mg by mouth at bedtime.    Yes Provider, Historical, Danny Cook  docusate sodium (COLACE) 250 MG capsule Take 250 mg by mouth 2 (two) times daily.   Yes Provider, Historical, Danny Cook  donepezil (ARICEPT) 5 MG tablet Take 10 mg by mouth daily.  07/19/19  Yes Provider, Historical, Danny Cook  FANAPT 12 MG TABS Take 12 mg by mouth 2 (two) times daily.  11/14/16  Yes Provider, Historical, Danny Cook  fluticasone (FLOVENT HFA) 220 MCG/ACT inhaler Inhale 2 puffs into the lungs 2 (two) times daily. Rinse out mouth afterwards 04/14/18  Yes Danny Cook, Richard, Danny Cook  gabapentin (NEURONTIN) 600 MG tablet Take 600 mg by mouth 2 (two) times daily.   Yes Provider, Historical, Danny Cook  gemfibrozil (LOPID) 600 MG tablet Take 600 mg by mouth 2 (two) times daily before a meal.   Yes Provider, Historical, Danny Cook  glimepiride (AMARYL) 4 MG tablet Take 4 mg by mouth daily with breakfast.  06/06/19  Yes Provider, Historical, Danny Cook  glycopyrrolate (ROBINUL) 1 MG tablet Take 1 mg by mouth 2 (two) times daily.   Yes Provider, Historical, Danny Cook  Insulin Degludec (TRESIBA FLEXTOUCH) 200 UNIT/ML SOPN Inject 10 Units into the skin daily. Patient taking differently: Inject 50 Units into the skin daily. 08/19/19  Yes Danny Hanlon, Danny Cook  losartan (COZAAR) 25 MG tablet Take 25 mg by mouth daily. 08/20/20  Yes Provider, Historical, Danny Cook  memantine (NAMENDA) 10 MG tablet Take  10 mg by mouth 2 (two) times daily.   Yes Provider, Historical, Danny Cook  metFORMIN (GLUCOPHAGE) 1000 MG tablet Take 1,000 mg by mouth 2 (two) times daily with a meal.   Yes Provider, Historical, Danny Cook  metoprolol tartrate (LOPRESSOR) 25 MG tablet Take 12.5 mg by mouth in the morning and at bedtime. 08/20/20  Yes Provider, Historical, Danny Cook  mirtazapine (REMERON) 30 MG tablet Take 30 mg by mouth at bedtime. 04/21/18  Yes Provider, Historical, Danny Cook  mometasone (ELOCON) 0.1 % ointment Apply topically daily. Apply 4 drops to ear canal at bedtime as needed for itching and dry skin.   Yes Provider, Historical, Danny Cook  Multiple Vitamin (MULTIVITAMIN) tablet Take 1 tablet by mouth daily.   Yes Provider, Historical, Danny Cook  nitroGLYCERIN (NITROSTAT) 0.4 MG SL tablet Place 0.4 mg under the tongue every 5 (five) minutes as needed for chest pain.    Yes Provider,  Historical, Danny Cook  Omega-3 Fatty Acids (FISH OIL) 1000 MG CAPS Take 1,000 mg by mouth daily.   Yes Provider, Historical, Danny Cook  omeprazole (PRILOSEC) 40 MG capsule Take 40 mg by mouth 2 (two) times daily.    Yes Provider, Historical, Danny Cook  OZEMPIC, 0.25 OR 0.5 MG/DOSE, 2 MG/1.5ML SOPN Inject 1 mg into the skin once a week.  06/06/19  Yes Provider, Historical, Danny Cook  rosuvastatin (CRESTOR) 40 MG tablet Take 40 mg by mouth daily.    Yes Provider, Historical, Danny Cook  senna (SENOKOT) 8.6 MG TABS tablet Take 1 tablet by mouth daily.    Yes Provider, Historical, Danny Cook  sucralfate (CARAFATE) 1 g tablet Take 1 g by mouth 4 (four) times daily.  04/21/18  Yes Provider, Historical, Danny Cook  tamsulosin (FLOMAX) 0.4 MG CAPS capsule Take 0.4 mg by mouth daily. 08/02/19  Yes Provider, Historical, Danny Cook  triamcinolone cream (KENALOG) 0.1 % Apply 1 application topically 2 (two) times daily as needed. Patient taking differently: Apply 1 application topically 2 (two) times daily. 11/16/16  Yes Little, Traci M, PA-C  venlafaxine XR (EFFEXOR-XR) 75 MG 24 hr capsule Take 1 capsule (75 mg total) by mouth daily with breakfast.  11/09/17  Yes McNew, Tyson Babinski, Danny Cook  ziprasidone (GEODON) 40 MG capsule Take 40 mg by mouth daily.   Yes Provider, Historical, Danny Cook  ferrous sulfate 325 (65 FE) MG tablet Take 325 mg by mouth daily with breakfast. Patient not taking: Reported on 08/29/2020    Provider, Historical, Danny Cook    Review of Systems  Constitutional: Positive for fatigue. Negative for appetite change.  HENT: Positive for rhinorrhea. Negative for congestion, postnasal drip and sore throat.   Eyes: Negative.   Respiratory: Positive for shortness of breath. Negative for cough and chest tightness.   Cardiovascular: Negative for chest pain, palpitations and leg swelling.  Gastrointestinal: Negative for abdominal distention and abdominal pain.  Endocrine: Negative.   Genitourinary: Negative.   Musculoskeletal: Positive for back pain (when standing for long periods of time). Negative for neck pain.  Skin: Negative.   Allergic/Immunologic: Negative.   Neurological: Positive for light-headedness (on occasion). Negative for dizziness.  Hematological: Negative for adenopathy. Does not bruise/bleed easily.  Psychiatric/Behavioral: Negative for dysphoric mood and sleep disturbance. The patient is nervous/anxious.    Vitals:   08/29/20 0901  BP: 127/74  Pulse: 93  Resp: 18  SpO2: 97%  Weight: 215 lb 6 oz (97.7 kg)  Height: 5\' 7"  (1.702 m)   Wt Readings from Last 3 Encounters:  08/29/20 215 lb 6 oz (97.7 kg)  07/29/20 214 lb 1.1 oz (97.1 kg)  07/12/20 214 lb (97.1 kg)   Lab Results  Component Value Date   CREATININE 1.16 07/31/2020   CREATININE 1.85 (H) 07/30/2020   CREATININE 2.30 (H) 07/29/2020    Physical Exam Vitals and nursing note reviewed.  Constitutional:      Appearance: He is well-developed.  HENT:     Head: Normocephalic and atraumatic.  Neck:     Vascular: No JVD.  Cardiovascular:     Rate and Rhythm: Normal rate and regular rhythm.  Pulmonary:     Effort: Pulmonary effort is normal.     Breath sounds:  No wheezing or rales.  Abdominal:     General: There is no distension.     Palpations: Abdomen is soft.     Tenderness: There is no abdominal tenderness.  Musculoskeletal:        General: No tenderness.     Cervical  back: Normal range of motion and neck supple.     Right lower leg: No tenderness. No edema.     Left lower leg: No tenderness. No edema.  Skin:    General: Skin is warm and dry.  Neurological:     General: No focal deficit present.     Mental Status: He is alert and oriented to person, place, and time.  Psychiatric:        Mood and Affect: Mood normal.        Behavior: Behavior normal.        Thought Content: Thought content normal.    Assessment & Plan:  1: Chronic heart failure with preserved ejection fraction- - NYHA class II - euvolemic today - weighing daily; reminded to call for an overnight weight gain of >2 pounds or a weekly weight gain of >5 pounds - weight up 1.6 pounds from last visit here 3 months ago - not adding salt to his food and doesn't think the food at the family care home tastes salty - saw cardiology Minette Brine) 01/26/20 - wearing compression socks daily with removal at bedtime and notes improvement in edema - now walking weekly around the walking track with his peer support person - BNP 07/29/20 was 31.0 - patient says that he's received all 3 of his COVID vaccines - reports receiving his flu vaccine for this season  2: HTN- - BP looks good today - BMP from 08/03/20 reviewed and showed sodium 141, potassium 4.0, creatinine 1.5 and GFR 56 - saw PCP Ola Spurr) 08/03/20  3: Diabetes- - A1c 07/31/20 was 6.3% - saw endocrinologist Jenetta DownerDina Rich) 04/19/20 - saw nephrologist Radene Knee) 07/23/20   Facility medication list was reviewed.  Return in 6 months or sooner for any questions/problems before then.

## 2020-08-29 NOTE — Patient Instructions (Signed)
Continue weighing daily and call for an overnight weight gain of > 2 pounds or a weekly weight gain of >5 pounds. 

## 2020-09-03 ENCOUNTER — Ambulatory Visit: Payer: Medicare Other | Admitting: Family

## 2020-09-12 ENCOUNTER — Other Ambulatory Visit: Payer: Self-pay

## 2020-09-12 ENCOUNTER — Other Ambulatory Visit
Admission: RE | Admit: 2020-09-12 | Discharge: 2020-09-12 | Disposition: A | Discharge status: Home / Self Care | Payer: Medicare Other | Source: Ambulatory Visit | Attending: Gastroenterology | Admitting: Gastroenterology

## 2020-09-12 ENCOUNTER — Encounter: Payer: Self-pay | Admitting: Urology

## 2020-09-12 ENCOUNTER — Ambulatory Visit (INDEPENDENT_AMBULATORY_CARE_PROVIDER_SITE_OTHER): Payer: Medicare Other | Admitting: Urology

## 2020-09-12 VITALS — BP 162/81 | HR 93 | Ht 67.0 in | Wt 221.0 lb

## 2020-09-12 DIAGNOSIS — Z20822 Contact with and (suspected) exposure to covid-19: Secondary | ICD-10-CM | POA: Insufficient documentation

## 2020-09-12 DIAGNOSIS — N200 Calculus of kidney: Secondary | ICD-10-CM

## 2020-09-12 DIAGNOSIS — R310 Gross hematuria: Secondary | ICD-10-CM

## 2020-09-12 DIAGNOSIS — Z01812 Encounter for preprocedural laboratory examination: Secondary | ICD-10-CM | POA: Insufficient documentation

## 2020-09-12 LAB — URINALYSIS, COMPLETE
Bilirubin, UA: NEGATIVE
Glucose, UA: NEGATIVE
Ketones, UA: NEGATIVE
Leukocytes,UA: NEGATIVE
Nitrite, UA: NEGATIVE
RBC, UA: NEGATIVE
Specific Gravity, UA: 1.03 (ref 1.005–1.030)
Urobilinogen, Ur: 0.2 mg/dL (ref 0.2–1.0)
pH, UA: 5.5 (ref 5.0–7.5)

## 2020-09-12 LAB — MICROSCOPIC EXAMINATION
Bacteria, UA: NONE SEEN
WBC, UA: NONE SEEN /hpf (ref 0–5)

## 2020-09-12 LAB — SARS CORONAVIRUS 2 (TAT 6-24 HRS): SARS Coronavirus 2: NEGATIVE

## 2020-09-12 NOTE — Progress Notes (Signed)
09/12/2020 10:53 AM   Danny Cook. Sep 01, 1960 [id number]  Referring provider: Leonel Ramsay, MD [place],  [place] [address]  Chief Complaint  Patient presents with  . Hematuria    HPI: 60 year old male with multiple medical comorbidities outlined below who presents today for further evaluation of gross hematuria.  He was seen and evaluated by his primary care physician on 08/31/2020 which time he noted several episodes of painless gross hematuria.  This has since subsided.  He has no urinary complaints today.  No flank pain.  No history of UTIs, dysuria.  Notably, his recently admitted for nausea. dizziness found to have gallbladder wall thickening 06/2020.  During admission, he had acute kidney injury with a creatinine up to 2.3 which is returned back to his baseline of 1.16.  He did have a CT scan in January during his hospital admission which was noncontrast.  This showed a 10 mm right upper pole stone which was nonobstructing but no other GU pathology.  Most recent PSA 0.21 on 08/2019  Not on any blood thinners other than aspirin.   PMH: Past Medical History:  Diagnosis Date  . Anemia   . Anxiety   . Arthritis   . CHF (congestive heart failure) ([place])   . Chronic kidney disease    Renal Insufficiency Syndrome; Glomerulosclerosis 2013  . Depressed   . Diabetes mellitus without complication ([place])   . GERD (gastroesophageal reflux disease)   . High cholesterol   . HIV (human immunodeficiency virus infection) ([place])   . Hypertension   . 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 Paranoid disorder ([place])   . Pneumonia   . Schizophrenia, paranoid ([place])   . Sleep apnea   . TIA (transient ischemic attack)     Surgical History: Past Surgical History:  Procedure Laterality Date  . CARDIAC CATHETERIZATION    . COLONOSCOPY WITH PROPOFOL N/A 09/03/2015   Procedure: COLONOSCOPY WITH PROPOFOL;  Surgeon: Lollie Sails, MD;  Location: [facility] ENDOSCOPY;   Service: Endoscopy;  Laterality: N/A;  . DG TEETH FULL    . ESOPHAGOGASTRODUODENOSCOPY (EGD) WITH PROPOFOL N/A 07/01/2016   Dr. [name],  GI. abnormal esophageal motility, suspicious for presbyesophagus, bile gastritis s/p biopsy, non-bleeding erosive gastropathy s/p biopsy, normal duodenum, path with negative H.pylori and +chronic active gastritis    Home Medications:  Allergies as of 09/12/2020      Reactions   Trazodone And Nefazodone    Affects other meds that they dont work   Trazodone Hcl Other (See Comments)   Viread [tenofovir Disoproxil]    Damages kidneys   Etodolac Rash      Medication List       Accurate as of September 12, 2020 10:53 AM. If you have any questions, ask your nurse or doctor.        albuterol 108 (90 Base) MCG/ACT inhaler Commonly known as: VENTOLIN HFA Inhale 2 puffs into the lungs every 6 (six) hours as needed for wheezing or shortness of breath.   aspirin EC 325 MG tablet Take 325 mg by mouth daily.   butalbital-aspirin-caffeine 50-325-40 MG capsule Commonly known as: FIORINAL Take 1-2 capsules by mouth every 6 (six) hours as needed for headache.   cyanocobalamin 1000 MCG tablet Take 1,000 mcg by mouth daily.   diazepam 5 MG tablet Commonly known as: VALIUM Take 5 mg by mouth 2 (two) times daily. And 2.5mg  mid day   diphenhydrAMINE 25 mg capsule Commonly known as: BENADRYL Take 50  mg by mouth at bedtime.   divalproex 500 MG 24 hr tablet Commonly known as: DEPAKOTE ER Take 500 mg by mouth at bedtime.   docusate sodium 250 MG capsule Commonly known as: COLACE Take 250 mg by mouth 2 (two) times daily.   donepezil 5 MG tablet Commonly known as: ARICEPT Take 10 mg by mouth daily.   Fanapt 12 MG Tabs Generic drug: Iloperidone Take 12 mg by mouth 2 (two) times daily.   ferrous sulfate 325 (65 FE) MG tablet Take 325 mg by mouth daily with breakfast.   Fish Oil 1000 MG Caps Take 1,000 mg by mouth daily.   fluticasone 220  MCG/ACT inhaler Commonly known as: Flovent HFA Inhale 2 puffs into the lungs 2 (two) times daily. Rinse out mouth afterwards   gabapentin 600 MG tablet Commonly known as: NEURONTIN Take 600 mg by mouth 2 (two) times daily.   gabapentin 400 MG capsule Commonly known as: NEURONTIN Take 400 mg by mouth 3 (three) times daily.   gemfibrozil 600 MG tablet Commonly known as: LOPID Take 600 mg by mouth 2 (two) times daily before a meal.   glimepiride 4 MG tablet Commonly known as: AMARYL Take 4 mg by mouth daily with breakfast.   glycopyrrolate 1 MG tablet Commonly known as: ROBINUL Take 1 mg by mouth 2 (two) times daily.   GNP [place] Adults 50+ Senior Tabs Take by mouth.   HumaLOG KwikPen 100 UNIT/ML KwikPen Generic drug: insulin lispro Inject into the skin.   losartan 25 MG tablet Commonly known as: COZAAR Take 25 mg by mouth daily.   memantine 10 MG tablet Commonly known as: NAMENDA Take 10 mg by mouth 2 (two) times daily.   metFORMIN 1000 MG tablet Commonly known as: GLUCOPHAGE Take 1,000 mg by mouth 2 (two) times daily with a meal.   metoprolol tartrate 25 MG tablet Commonly known as: LOPRESSOR Take 12.5 mg by mouth in the morning and at bedtime.   mirtazapine 30 MG tablet Commonly known as: REMERON Take 30 mg by mouth at bedtime.   mometasone 0.1 % ointment Commonly known as: ELOCON Apply topically daily. Apply 4 drops to ear canal at bedtime as needed for itching and dry skin.   mometasone 0.1 % lotion Commonly known as: ELOCON Apply topically.   multivitamin tablet Take 1 tablet by mouth daily.   nitroGLYCERIN 0.4 MG SL tablet Commonly known as: NITROSTAT Place 0.4 mg under the tongue every 5 (five) minutes as needed for chest pain.   omeprazole 40 MG capsule Commonly known as: PRILOSEC Take 40 mg by mouth 2 (two) times daily.   ondansetron 4 MG tablet Commonly known as: ZOFRAN Take 4 mg by mouth every 8 (eight) hours as needed.   Ozempic  (0.25 or 0.5 MG/DOSE) 2 MG/1.5ML Sopn Generic drug: Semaglutide(0.25 or 0.5MG /DOS) Inject 1 mg into the skin once a week.   rosuvastatin 40 MG tablet Commonly known as: CRESTOR Take 40 mg by mouth daily.   senna 8.6 MG Tabs tablet Commonly known as: SENOKOT Take 1 tablet by mouth daily.   sucralfate 1 g tablet Commonly known as: CARAFATE Take 1 g by mouth 4 (four) times daily.   tamsulosin 0.4 MG Caps capsule Commonly known as: FLOMAX Take 0.4 mg by mouth daily.   Tyler Aas FlexTouch 200 UNIT/ML FlexTouch Pen Generic drug: insulin degludec Inject 10 Units into the skin daily. What changed: how much to take   triamcinolone 0.1 % Commonly known as: KENALOG Apply 1  application topically 2 (two) times daily as needed. What changed: when to take this   Triumeq 600-50-300 MG tablet Generic drug: abacavir-dolutegravir-lamiVUDine Take 1 tablet by mouth at bedtime.   venlafaxine XR 75 MG 24 hr capsule Commonly known as: EFFEXOR-XR Take 1 capsule (75 mg total) by mouth daily with breakfast.   ziprasidone 40 MG capsule Commonly known as: GEODON Take 40 mg by mouth daily.       Allergies:  Allergies  Allergen Reactions  . Trazodone And Nefazodone     Affects other meds that they dont work  . Trazodone Hcl Other (See Comments)  . Viread [Tenofovir Disoproxil]     Damages kidneys  . Etodolac Rash    Family History: Family History  Problem Relation Age of Onset  . Heart attack Mother   . Diabetes Mellitus II Mother   . Mental illness Mother   . CAD Mother   . Heart attack Father   . CAD Father   . Hypertension Father     Social History:  reports that he has never smoked. He has never used smokeless tobacco. He reports that he does not drink alcohol and does not use drugs.   Physical Exam: BP (!) 162/81   Pulse 93   Ht 5\' 7"  (1.702 m)   Wt 221 lb (100.2 kg)   BMI 34.61 kg/m   Constitutional:  Alert and oriented, No acute distress. HEENT: [place] AT, moist mucus  membranes.  Trachea midline, no masses. Cardiovascular: No clubbing, cyanosis, or edema. Respiratory: Normal respiratory effort, no increased work of breathing. Skin: No rashes, bruises or suspicious lesions. Neurologic: Grossly intact, no focal deficits, moving all 4 extremities. Psychiatric: Normal mood and affect.  Laboratory Data: Lab Results  Component Value Date   WBC 6.9 07/31/2020   HGB 11.9 (L) 07/31/2020   HCT 36.0 (L) 07/31/2020   MCV 98.1 07/31/2020   PLT 196 07/31/2020    Lab Results  Component Value Date   CREATININE 1.16 07/31/2020    Urinalysis Urinalysis today is negative, no microscopic blood.  He does have incidental 3+ protein.  Pertinent Imaging: CLINICAL DATA:  Abdominal distension.  EXAM: CT ABDOMEN AND PELVIS WITHOUT CONTRAST  TECHNIQUE: Multidetector CT imaging of the abdomen and pelvis was performed following the standard protocol without IV contrast.  COMPARISON:  CT dated 08/15/2019  FINDINGS: Lower chest: The lung bases are clear. The heart size is normal.  Hepatobiliary: There appears to be heterogeneous hepatic steatosis. There is diffuse wall thickening of the gallbladder with adjacent fat stranding. There is a potential stone in the distal common bile duct measuring approximately 7 mm (axial series 3, image 37.There is no significant intrahepatic biliary ductal dilatation.  Pancreas: Normal contours without ductal dilatation. No peripancreatic fluid collection.  Spleen: Unremarkable.  Adrenals/Urinary Tract:  --Adrenal glands: There is a stable left adrenal mass.  --Right kidney/ureter: There is a nonobstructing stone in the upper pole the right kidney measuring approximately 1.1 cm.  --Left kidney/ureter: No hydronephrosis or radiopaque kidney stones.  --Urinary bladder: Unremarkable.  Stomach/Bowel:  --Stomach/Duodenum: No hiatal hernia or other gastric abnormality. Normal duodenal course and  caliber.  --Small bowel: Unremarkable.  --Colon: Unremarkable.  --Appendix: Normal.  Vascular/Lymphatic: Atherosclerotic calcification is present within the non-aneurysmal abdominal aorta, without hemodynamically significant stenosis.  --No retroperitoneal lymphadenopathy.  --No mesenteric lymphadenopathy.  --No pelvic or inguinal lymphadenopathy.  Reproductive: Unremarkable  Other: No ascites or free air. The abdominal wall is normal.  Musculoskeletal. No acute displaced fractures.  IMPRESSION: 1. Diffuse wall thickening of the gallbladder with adjacent fat stranding, concerning for acute cholecystitis. 2. There is a potential 7 mm stone in the distal common bile duct. There is no significant intrahepatic biliary ductal dilatation. 3. Heterogeneous hepatic steatosis. 4. Nonobstructing right nephrolithiasis.  Aortic Atherosclerosis (ICD10-I70.0).   Electronically Signed   By: Constance Holster M.D.  Noncontrast CT scan imaging was personally reviewed today.  Agree with radiologic interpretation.  There is a large 1.1 cm nonobstructing right renal calculus in the upper pole without any surrounding inflammation.  Assessment & Plan:    1. Gross hematuria Painless gross hematuria and high risk patient  We discussed the AUA guidelines in terms of work-up for this.  Given that his creatinine is normalized, would recommend a CT urogram and cystoscopy.  We discussed the procedure at length along with the risk and benefits.  All questions were answered.  He is agreeable to proceed. - Urinalysis, Complete - CT HEMATURIA WORKUP; Future  2. Right kidney stone Incidental 1.1 cm right upper pole stone which presumably is asymptomatic  Unclear if this is the cause of the hematuria although unlikely given its fairly chronic nature  We discussed whether or not to treat the stone depending on findings of work-up for #1  May consider observation versus  intervention especially given its size  F/u Ct Urogram/ cyst  Hollice Espy, MD  [facility] [address], [place], [place] [address] [phone]

## 2020-09-13 ENCOUNTER — Other Ambulatory Visit: Payer: Medicare Other

## 2020-09-17 ENCOUNTER — Encounter
Admission: RE | Disposition: A | Discharge status: Home / Self Care | Payer: Self-pay | Source: Home / Self Care | Attending: Gastroenterology

## 2020-09-17 ENCOUNTER — Encounter: Payer: Self-pay | Admitting: *Deleted

## 2020-09-17 ENCOUNTER — Ambulatory Visit
Admission: RE | Admit: 2020-09-17 | Discharge: 2020-09-17 | Disposition: A | Discharge status: Home / Self Care | Payer: Medicare Other | Attending: Gastroenterology | Admitting: Gastroenterology

## 2020-09-17 ENCOUNTER — Ambulatory Visit: Payer: Medicare Other | Admitting: Certified Registered"

## 2020-09-17 DIAGNOSIS — Z21 Asymptomatic human immunodeficiency virus [HIV] infection status: Secondary | ICD-10-CM | POA: Diagnosis not present

## 2020-09-17 DIAGNOSIS — Z79899 Other long term (current) drug therapy: Secondary | ICD-10-CM | POA: Diagnosis not present

## 2020-09-17 DIAGNOSIS — Z888 Allergy status to other drugs, medicaments and biological substances status: Secondary | ICD-10-CM | POA: Diagnosis not present

## 2020-09-17 DIAGNOSIS — I110 Hypertensive heart disease with heart failure: Secondary | ICD-10-CM | POA: Diagnosis not present

## 2020-09-17 DIAGNOSIS — Z87442 Personal history of urinary calculi: Secondary | ICD-10-CM | POA: Diagnosis not present

## 2020-09-17 DIAGNOSIS — K5900 Constipation, unspecified: Secondary | ICD-10-CM | POA: Insufficient documentation

## 2020-09-17 DIAGNOSIS — K2930 Chronic superficial gastritis without bleeding: Secondary | ICD-10-CM | POA: Diagnosis not present

## 2020-09-17 DIAGNOSIS — I509 Heart failure, unspecified: Secondary | ICD-10-CM | POA: Diagnosis not present

## 2020-09-17 DIAGNOSIS — E119 Type 2 diabetes mellitus without complications: Secondary | ICD-10-CM | POA: Insufficient documentation

## 2020-09-17 DIAGNOSIS — K640 First degree hemorrhoids: Secondary | ICD-10-CM | POA: Insufficient documentation

## 2020-09-17 DIAGNOSIS — R1084 Generalized abdominal pain: Secondary | ICD-10-CM | POA: Insufficient documentation

## 2020-09-17 DIAGNOSIS — Z794 Long term (current) use of insulin: Secondary | ICD-10-CM | POA: Diagnosis not present

## 2020-09-17 DIAGNOSIS — Z8673 Personal history of transient ischemic attack (TIA), and cerebral infarction without residual deficits: Secondary | ICD-10-CM | POA: Insufficient documentation

## 2020-09-17 DIAGNOSIS — F32A Depression, unspecified: Secondary | ICD-10-CM | POA: Insufficient documentation

## 2020-09-17 DIAGNOSIS — Z886 Allergy status to analgesic agent status: Secondary | ICD-10-CM | POA: Diagnosis not present

## 2020-09-17 DIAGNOSIS — Z7984 Long term (current) use of oral hypoglycemic drugs: Secondary | ICD-10-CM | POA: Insufficient documentation

## 2020-09-17 DIAGNOSIS — K219 Gastro-esophageal reflux disease without esophagitis: Secondary | ICD-10-CM | POA: Diagnosis not present

## 2020-09-17 HISTORY — DX: Personal history of urinary calculi: Z87.442

## 2020-09-17 HISTORY — PX: COLONOSCOPY WITH PROPOFOL: SHX5780

## 2020-09-17 HISTORY — PX: ESOPHAGOGASTRODUODENOSCOPY (EGD) WITH PROPOFOL: SHX5813

## 2020-09-17 LAB — URINE DRUG SCREEN, QUALITATIVE (ARMC ONLY)
Amphetamines, Ur Screen: NOT DETECTED
Barbiturates, Ur Screen: NOT DETECTED
Benzodiazepine, Ur Scrn: POSITIVE — AB
Cannabinoid 50 Ng, Ur ~~LOC~~: NOT DETECTED
Cocaine Metabolite,Ur ~~LOC~~: NOT DETECTED
MDMA (Ecstasy)Ur Screen: NOT DETECTED
Methadone Scn, Ur: NOT DETECTED
Opiate, Ur Screen: NOT DETECTED
Phencyclidine (PCP) Ur S: NOT DETECTED
Tricyclic, Ur Screen: NOT DETECTED

## 2020-09-17 LAB — GLUCOSE, CAPILLARY: Glucose-Capillary: 135 mg/dL — ABNORMAL HIGH (ref 70–99)

## 2020-09-17 SURGERY — COLONOSCOPY WITH PROPOFOL
Anesthesia: General

## 2020-09-17 MED ORDER — PROPOFOL 10 MG/ML IV BOLUS
INTRAVENOUS | Status: DC | PRN
  Administered 2020-09-17: 60 mg via INTRAVENOUS
  Administered 2020-09-17: 40 mg via INTRAVENOUS

## 2020-09-17 MED ORDER — LIDOCAINE HCL (CARDIAC) PF 100 MG/5ML IV SOSY
PREFILLED_SYRINGE | INTRAVENOUS | Status: DC | PRN
  Administered 2020-09-17: 100 mg via INTRAVENOUS

## 2020-09-17 MED ORDER — SODIUM CHLORIDE 0.9 % IV SOLN: INTRAVENOUS | Status: DC

## 2020-09-17 MED ORDER — GLYCOPYRROLATE 0.2 MG/ML IJ SOLN
INTRAMUSCULAR | Status: DC | PRN
  Administered 2020-09-17: .2 mg via INTRAVENOUS

## 2020-09-17 MED ORDER — PROPOFOL 500 MG/50ML IV EMUL
INTRAVENOUS | Status: DC | PRN
  Administered 2020-09-17: 155 ug/kg/min via INTRAVENOUS

## 2020-09-17 NOTE — Anesthesia Procedure Notes (Signed)
Procedure Name: MAC Date/Time: 09/17/2020 9:13 AM Performed by: Kelton Pillar, CRNA Pre-anesthesia Checklist: Patient identified, Emergency Drugs available, Suction available, Patient being monitored and Timeout performed Oxygen Delivery Method: Simple face mask Induction Type: IV induction

## 2020-09-17 NOTE — H&P (Signed)
Outpatient short stay form Pre-procedure 09/17/2020 9:05 AM Danny Miyamoto MD, MPH  Primary Physician: Dr. Ola Spurr  Reason for visit:  Abdominal pain/Bowel habit changes  History of present illness:   60 y/o gentleman with history of HIV, DM, CHF, and depression here for EGD/Colonoscopy for abdominal pain/bowel habit changes. No dysphagia. No blood thinners. No significant abdominal surgeries. No family history of GI malignancies.    Current Facility-Administered Medications:  .  0.9 %  sodium chloride infusion, , Intravenous, Continuous, [place], Hilton Cork, MD, Last Rate: 20 mL/hr at 09/17/20 0847, New Bag at 09/17/20 0847  Medications Prior to Admission  Medication Sig Dispense Refill Last Dose  . abacavir-dolutegravir-lamiVUDine (TRIUMEQ) 600-50-300 MG tablet Take 1 tablet by mouth at bedtime. 30 tablet 6 09/16/2020 at Unknown time  . albuterol (PROVENTIL HFA;VENTOLIN HFA) 108 (90 Base) MCG/ACT inhaler Inhale 2 puffs into the lungs every 6 (six) hours as needed for wheezing or shortness of breath. 1 Inhaler 2 09/16/2020 at Unknown time  . aspirin EC 325 MG tablet Take 325 mg by mouth daily.   09/16/2020 at Unknown time  . bumetanide (BUMEX) 1 MG tablet Take 1 mg by mouth every other day.   09/16/2020 at Unknown time  . butalbital-acetaminophen-caffeine (FIORICET) 50-325-40 MG tablet Take by mouth every 4 (four) hours as needed for headache.    at prn  . cyanocobalamin 1000 MCG tablet Take 1,000 mcg by mouth daily.    09/16/2020 at Unknown time  . diazepam (VALIUM) 5 MG tablet Take 5 mg by mouth every 12 (twelve) hours as needed for anxiety. And 2.5mg  mid day   09/16/2020 at Unknown time  . diphenhydrAMINE (BENADRYL) 25 mg capsule Take 50 mg by mouth at bedtime.   09/16/2020 at Unknown time  . divalproex (DEPAKOTE ER) 500 MG 24 hr tablet Take 500 mg by mouth at bedtime.    09/16/2020 at Unknown time  . docusate sodium (COLACE) 250 MG capsule Take 250 mg by mouth 2 (two) times daily.    09/16/2020 at Unknown time  . donepezil (ARICEPT) 5 MG tablet Take 10 mg by mouth daily.    09/16/2020 at Unknown time  . FANAPT 12 MG TABS Take 12 mg by mouth 2 (two) times daily.    09/16/2020 at Unknown time  . fluticasone (FLOVENT HFA) 220 MCG/ACT inhaler Inhale 2 puffs into the lungs 2 (two) times daily. Rinse out mouth afterwards 1 Inhaler 12 09/16/2020 at Unknown time  . gabapentin (NEURONTIN) 400 MG capsule Take 400 mg by mouth 3 (three) times daily.   09/16/2020 at Unknown time  . gemfibrozil (LOPID) 600 MG tablet Take 600 mg by mouth 2 (two) times daily before a meal.   09/16/2020 at Unknown time  . glimepiride (AMARYL) 4 MG tablet Take 4 mg by mouth daily with breakfast.    09/16/2020 at Unknown time  . glycopyrrolate (ROBINUL) 1 MG tablet Take 1 mg by mouth 2 (two) times daily.   09/16/2020 at Unknown time  . meclizine (ANTIVERT) 25 MG tablet Take 25 mg by mouth 3 (three) times daily as needed for dizziness.    at prn  . metFORMIN (GLUCOPHAGE) 1000 MG tablet Take 1,000 mg by mouth 2 (two) times daily with a meal.   09/16/2020 at Unknown time  . metoprolol tartrate (LOPRESSOR) 25 MG tablet Take 12.5 mg by mouth in the morning and at bedtime.   09/16/2020 at Unknown time  . mirtazapine (REMERON) 30 MG tablet Take 30 mg by mouth at  bedtime.   09/16/2020 at Unknown time  . mometasone (ELOCON) 0.1 % lotion Apply topically.   09/16/2020 at Unknown time  . mometasone (ELOCON) 0.1 % ointment Apply topically daily. Apply 4 drops to ear canal at bedtime as needed for itching and dry skin.   09/16/2020 at Unknown time  . Multiple Vitamin (MULTIVITAMIN) tablet Take 1 tablet by mouth daily.   09/16/2020 at Unknown time  . Multiple Vitamins-Minerals (G[facility] CENTURY ADULTS 50+ SENIOR) TABS Take by mouth.   09/16/2020 at Unknown time  . nystatin cream (MYCOSTATIN) Apply 1 application topically 2 (two) times daily.   09/16/2020 at Unknown time  . Omega-3 Fatty Acids (FISH OIL) 1000 MG CAPS Take 1,000 mg by mouth daily.    09/16/2020 at Unknown time  . omeprazole (PRILOSEC) 40 MG capsule Take 40 mg by mouth 2 (two) times daily.    09/16/2020 at Unknown time  . OZEMPIC, 0.25 OR 0.5 MG/DOSE, 2 MG/1.5ML SOPN Inject 1 mg into the skin once a week.    09/16/2020 at Unknown time  . promethazine (PHENERGAN) 6.25 MG/5ML syrup Take 6.25 mg by mouth every 6 (six) hours as needed for nausea.    at prn  . rosuvastatin (CRESTOR) 40 MG tablet Take 40 mg by mouth daily.    09/16/2020 at Unknown time  . senna (SENOKOT) 8.6 MG TABS tablet Take 1 tablet by mouth daily.    Past Week at Unknown time  . sucralfate (CARAFATE) 1 g tablet Take 1 g by mouth 4 (four) times daily.    09/16/2020 at Unknown time  . tamsulosin (FLOMAX) 0.4 MG CAPS capsule Take 0.4 mg by mouth daily.   09/16/2020 at Unknown time  . triamcinolone cream (KENALOG) 0.1 % Apply 1 application topically 2 (two) times daily as needed. (Patient taking differently: Apply 1 application topically 2 (two) times daily.) 30 g 0 09/16/2020 at Unknown time  . venlafaxine XR (EFFEXOR-XR) 75 MG 24 hr capsule Take 1 capsule (75 mg total) by mouth daily with breakfast. 30 capsule 0 09/16/2020 at Unknown time  . ziprasidone (GEODON) 40 MG capsule Take 40 mg by mouth daily.   09/16/2020 at Unknown time  . butalbital-aspirin-caffeine (FIORINAL) 50-325-40 MG capsule Take 1-2 capsules by mouth every 6 (six) hours as needed for headache.      . ferrous sulfate 325 (65 FE) MG tablet Take 325 mg by mouth daily with breakfast. (Patient not taking: No sig reported)   Not Taking at Unknown time  . gabapentin (NEURONTIN) 600 MG tablet Take 600 mg by mouth 2 (two) times daily.     . haloperidol (HALDOL) 5 MG tablet Take 5 mg by mouth 2 (two) times daily. (Patient not taking: Reported on 09/17/2020)   Completed Course at Unknown time  . HUMALOG KWIKPEN 100 UNIT/ML KwikPen Inject into the skin.     . Insulin Degludec (TRESIBA FLEXTOUCH) 200 UNIT/ML SOPN Inject 10 Units into the skin daily. (Patient taking  differently: Inject 50 Units into the skin daily.)     . losartan (COZAAR) 25 MG tablet Take 25 mg by mouth daily. (Patient not taking: Reported on 09/17/2020)   Completed Course at Unknown time  . memantine (NAMENDA) 10 MG tablet Take 10 mg by mouth 2 (two) times daily.     . nitroGLYCERIN (NITROSTAT) 0.4 MG SL tablet Place 0.4 mg under the tongue every 5 (five) minutes as needed for chest pain.      [name] ondansetron (ZOFRAN) 4 MG tablet Take 4  mg by mouth every 8 (eight) hours as needed.    at prn     Allergies  Allergen Reactions  . Trazodone And Nefazodone     Affects other meds that they dont work  . Trazodone Hcl Other (See Comments)  . Viread [Tenofovir Disoproxil]     Damages kidneys  . Etodolac Rash     Past Medical History:  Diagnosis Date  . Anemia   . Anxiety   . Arthritis   . CHF (congestive heart failure) ([place])   . Chronic kidney disease    Renal Insufficiency Syndrome; Glomerulosclerosis 2013  . Depressed   . Diabetes mellitus without complication ([place])   . GERD (gastroesophageal reflux disease)   . High cholesterol   . History of kidney stones   . HIV (human immunodeficiency virus infection) ([place])   . Hypertension   . 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 Paranoid disorder ([place])   . Pneumonia   . Schizophrenia, paranoid ([place])   . Sleep apnea   . TIA (transient ischemic attack)     Review of systems:  Otherwise negative.    Physical Exam  Gen: Alert, oriented. Appears stated age.  HEENT: PERRLA. Lungs: No respiratory distress CV: RRR Abd: soft, benign, no masses Ext: No edema    Planned procedures: Proceed with EGD/colonoscopy. The patient understands the nature of the planned procedure, indications, risks, alternatives and potential complications including but not limited to bleeding, infection, perforation, damage to internal organs and possible oversedation/side effects from anesthesia. The patient agrees and gives consent to proceed.  Please refer to  procedure notes for findings, recommendations and patient disposition/instructions.     Danny Miyamoto MD, MPH Gastroenterology 09/17/2020  9:05 AM

## 2020-09-17 NOTE — Op Note (Signed)
[facility] Gastroenterology Patient Name: Danny Cook Procedure Date: 09/17/2020 9:11 AM MRN: [id number] Account #: [id number] Date of Birth: 1961-01-17 Admit Type: Outpatient Age: 60 Room: [facility] ENDO ROOM 3 Gender: Male Note Status: Finalized Procedure:             Colonoscopy Indications:           Change in bowel habits, Constipation Providers:             Andrey Farmer MD, MD Referring MD:          Adrian Prows (Referring MD) Medicines:             Monitored Anesthesia Care Complications:         No immediate complications. Procedure:             Pre-Anesthesia Assessment:                        - Prior to the procedure, a History and Physical was                         performed, and patient medications and allergies were                         reviewed. The patient is competent. The risks and                         benefits of the procedure and the sedation options and                         risks were discussed with the patient. All questions                         were answered and informed consent was obtained.                         Patient identification and proposed procedure were                         verified by the physician, the nurse, the anesthetist                         and the technician in the endoscopy suite. Mental                         Status Examination: alert and oriented. Airway                         Examination: normal oropharyngeal airway and neck                         mobility. Respiratory Examination: clear to                         auscultation. CV Examination: normal. Prophylactic                         Antibiotics: The patient does not require prophylactic  antibiotics. Prior Anticoagulants: The patient has                         taken no previous anticoagulant or antiplatelet                         agents. ASA Grade Assessment: III - A patient with                          severe systemic disease. After reviewing the risks and                         benefits, the patient was deemed in satisfactory                         condition to undergo the procedure. The anesthesia                         plan was to use monitored anesthesia care (MAC).                         Immediately prior to administration of medications,                         the patient was re-assessed for adequacy to receive                         sedatives. The heart rate, respiratory rate, oxygen                         saturations, blood pressure, adequacy of pulmonary                         ventilation, and response to care were monitored                         throughout the procedure. The physical status of the                         patient was re-assessed after the procedure.                        After obtaining informed consent, the colonoscope was                         passed under direct vision. Throughout the procedure,                         the patient's blood pressure, pulse, and oxygen                         saturations were monitored continuously. The                         Colonoscope was introduced through the anus and                         advanced to the the cecum, identified by appendiceal  orifice and ileocecal valve. The colonoscopy was                         performed without difficulty. The patient tolerated                         the procedure well. The quality of the bowel                         preparation was adequate to identify polyps. Findings:      The perianal and digital rectal examinations were normal.      The colon (entire examined portion) appeared normal.      Internal hemorrhoids were found during retroflexion. The hemorrhoids       were Grade I (internal hemorrhoids that do not prolapse).      No additional abnormalities were found on retroflexion. Impression:            - The entire examined colon is  normal.                        - Internal hemorrhoids.                        - No specimens collected. Recommendation:        - Discharge patient to home.                        - Resume previous diet.                        - Continue present medications.                        - Repeat colonoscopy in 10 years for screening                         purposes.                        - Return to referring physician as previously                         scheduled. Procedure Code(s):     --- Professional ---                        [id number], Colonoscopy, flexible; diagnostic, including                         collection of specimen(s) by brushing or washing, when                         performed (separate procedure) Diagnosis Code(s):     --- Professional ---                        K64.0, First degree hemorrhoids                        R19.4, Change in bowel habit                        K59.00,  Constipation, unspecified CPT copyright 2019 American Medical Association. All rights reserved. The codes documented in this report are preliminary and upon coder review may  be revised to meet current compliance requirements. Andrey Farmer MD, MD 09/17/2020 9:53:06 AM Number of Addenda: 0 Note Initiated On: 09/17/2020 9:11 AM Scope Withdrawal Time: 0 hours 8 minutes 0 seconds  Total Procedure Duration: 0 hours 17 minutes 45 seconds  Estimated Blood Loss:  Estimated blood loss: none.      [facility]

## 2020-09-17 NOTE — Transfer of Care (Signed)
Immediate Anesthesia Transfer of Care Note  Patient: Danny Cook.  Procedure(s) Performed: COLONOSCOPY WITH PROPOFOL (N/A ) ESOPHAGOGASTRODUODENOSCOPY (EGD) WITH PROPOFOL (N/A )  Patient Location: Endoscopy Unit  Anesthesia Type:General  Level of Consciousness: drowsy and patient cooperative  Airway & Oxygen Therapy: Patient Spontanous Breathing and Patient connected to face mask oxygen  Post-op Assessment: Report given to RN and Post -op Vital signs reviewed and stable  Post vital signs: Reviewed and stable  Last Vitals:  Vitals Value Taken Time  BP 112/61 09/17/20 0947  Temp    Pulse 63 09/17/20 0952  Resp 10 09/17/20 0952  SpO2 100 % 09/17/20 0952  Vitals shown include unvalidated device data.  Last Pain:  Vitals:   09/17/20 0945  TempSrc:   PainSc: 0-No pain         Complications: No complications documented.

## 2020-09-17 NOTE — Anesthesia Postprocedure Evaluation (Signed)
Anesthesia Post Note  Patient: Danny Cook.  Procedure(s) Performed: COLONOSCOPY WITH PROPOFOL (N/A ) ESOPHAGOGASTRODUODENOSCOPY (EGD) WITH PROPOFOL (N/A )  Patient location during evaluation: Endoscopy Anesthesia Type: General Level of consciousness: awake and alert Pain management: pain level controlled Vital Signs Assessment: post-procedure vital signs reviewed and stable Respiratory status: spontaneous breathing, nonlabored ventilation, respiratory function stable and patient connected to nasal cannula oxygen Cardiovascular status: blood pressure returned to baseline and stable Postop Assessment: no apparent nausea or vomiting Anesthetic complications: no   No complications documented.   Last Vitals:  Vitals:   09/17/20 0955 09/17/20 1015  BP: 117/72   Pulse: 63 63  Resp: 10 18  Temp:    SpO2: 100% 97%    Last Pain:  Vitals:   09/17/20 1015  TempSrc:   PainSc: 0-No pain                 Martha Clan

## 2020-09-17 NOTE — Interval H&P Note (Signed)
History and Physical Interval Note:  09/17/2020 9:09 AM  Danny Cook.  has presented today for surgery, with the diagnosis of BH CHANGES.  The various methods of treatment have been discussed with the patient and family. After consideration of risks, benefits and other options for treatment, the patient has consented to  Procedure(s) with comments: COLONOSCOPY WITH PROPOFOL (N/A) - PREFERS 9AM OR LATER DUE TO TRANSPORTATION ESOPHAGOGASTRODUODENOSCOPY (EGD) WITH PROPOFOL (N/A) as a surgical intervention.  The patient's history has been reviewed, patient examined, no change in status, stable for surgery.  I have reviewed the patient's chart and labs.  Questions were answered to the patient's satisfaction.     Lesly Rubenstein  Ok to proceed with EGD/Colonoscopy

## 2020-09-17 NOTE — Anesthesia Preprocedure Evaluation (Signed)
Anesthesia Evaluation  Patient identified by MRN, date of birth, ID band Patient awake    Reviewed: Allergy & Precautions, H&P , NPO status , Patient's Chart, lab work & pertinent test results  History of Anesthesia Complications Negative for: history of anesthetic complications  Airway Mallampati: III  TM Distance: <3 FB Neck ROM: limited    Dental  (+) Edentulous Upper, Upper Dentures, Lower Dentures, Edentulous Lower, Dental Advidsory Given   Pulmonary neg shortness of breath, sleep apnea , neg COPD, neg recent URI,    Pulmonary exam normal breath sounds clear to auscultation       Cardiovascular Exercise Tolerance: Good hypertension, (-) angina+CHF  (-) Past MI, (-) Cardiac Stents and (-) DOE Normal cardiovascular exam(-) dysrhythmias + Valvular Problems/Murmurs  Rhythm:regular Rate:Normal     Neuro/Psych neg Seizures PSYCHIATRIC DISORDERS Anxiety Depression Schizophrenia TIA   GI/Hepatic Neg liver ROS, GERD  ,  Endo/Other  diabetes  Renal/GU CRFRenal disease  negative genitourinary   Musculoskeletal   Abdominal   Peds  Hematology negative hematology ROS (+)   Anesthesia Other Findings Past Medical History: No date: Anemia No date: Anxiety No date: Chronic kidney disease     Comment: Renal Insufficiency Syndrome;               Glomerulosclerosis 2993 No date: Complication of anesthesia No date: Depressed No date: Diabetes mellitus without complication (HCC) No date: High cholesterol No date: HIV (human immunodeficiency virus infection) (* No date: Hypertension No date: 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[place]) No date: Paranoid disorder ([place]) No date: Schizophrenia, paranoid ([place]) No date: Sleep apnea  Past Surgical History: 09/03/2015: COLONOSCOPY WITH PROPOFOL N/A     Comment: Procedure: COLONOSCOPY WITH PROPOFOL;                Surgeon: Lollie Sails, MD;  Location: [facility]              ENDOSCOPY;  Service:  Endoscopy;  Laterality:               N/A; No date: DG TEETH FULL  BMI    Body Mass Index:  34.14 kg/m      Reproductive/Obstetrics negative OB ROS                             Anesthesia Physical  Anesthesia Plan  ASA: III  Anesthesia Plan: General   Post-op Pain Management:    Induction: Intravenous  PONV Risk Score and Plan: 2 and TIVA and Propofol infusion  Airway Management Planned: Natural Airway and Nasal Cannula  Additional Equipment:   Intra-op Plan:   Post-operative Plan:   Informed Consent: I have reviewed the patients History and Physical, chart, labs and discussed the procedure including the risks, benefits and alternatives for the proposed anesthesia with the patient or authorized representative who has indicated his/her understanding and acceptance.     Dental Advisory Given  Plan Discussed with: Anesthesiologist, CRNA and Surgeon  Anesthesia Plan Comments:         Anesthesia Quick Evaluation

## 2020-09-17 NOTE — Op Note (Signed)
[facility] Gastroenterology Patient Name: Danny Cook Procedure Date: 09/17/2020 9:11 AM MRN: [id number] Account #: [id number] Date of Birth: 1961/05/29 Admit Type: Outpatient Age: 60 Room: [facility] ENDO ROOM 3 Gender: Male Note Status: Finalized Procedure:             Upper GI endoscopy Indications:           Generalized abdominal pain, Gastro-esophageal reflux                         disease Providers:             Andrey Farmer MD, MD Referring MD:          Adrian Prows (Referring MD) Medicines:             Monitored Anesthesia Care Complications:         No immediate complications. Estimated blood loss:                         Minimal. Procedure:             Pre-Anesthesia Assessment:                        - Prior to the procedure, a History and Physical was                         performed, and patient medications and allergies were                         reviewed. The patient is competent. The risks and                         benefits of the procedure and the sedation options and                         risks were discussed with the patient. All questions                         were answered and informed consent was obtained.                         Patient identification and proposed procedure were                         verified by the physician, the nurse, the anesthetist                         and the technician in the endoscopy suite. Mental                         Status Examination: alert and oriented. Airway                         Examination: normal oropharyngeal airway and neck                         mobility. Respiratory Examination: clear to                         auscultation. CV Examination:  normal. Prophylactic                         Antibiotics: The patient does not require prophylactic                         antibiotics. Prior Anticoagulants: The patient has                         taken no previous anticoagulant or  antiplatelet agents                         except for aspirin. ASA Grade Assessment: III - A                         patient with severe systemic disease. After reviewing                         the risks and benefits, the patient was deemed in                         satisfactory condition to undergo the procedure. The                         anesthesia plan was to use monitored anesthesia care                         (MAC). Immediately prior to administration of                         medications, the patient was re-assessed for adequacy                         to receive sedatives. The heart rate, respiratory                         rate, oxygen saturations, blood pressure, adequacy of                         pulmonary ventilation, and response to care were                         monitored throughout the procedure. The physical                         status of the patient was re-assessed after the                         procedure.                        After obtaining informed consent, the endoscope was                         passed under direct vision. Throughout the procedure,                         the patient's blood pressure, pulse, and oxygen  saturations were monitored continuously. The Endoscope                         was introduced through the mouth, and advanced to the                         second part of duodenum. The upper GI endoscopy was                         accomplished without difficulty. The patient tolerated                         the procedure well. Findings:      The examined esophagus was normal.      Striped moderately erythematous mucosa without bleeding was found in the       gastric antrum. Biopsies were taken with a cold forceps for Helicobacter       pylori testing. Estimated blood loss was minimal.      The examined duodenum was normal. Impression:            - Normal esophagus.                        - Erythematous  mucosa in the antrum. Biopsied.                        - Normal examined duodenum. Recommendation:        - Perform a colonoscopy today. Procedure Code(s):     --- Professional ---                        [phone], Esophagogastroduodenoscopy, flexible,                         transoral; with biopsy, single or multiple Diagnosis Code(s):     --- Professional ---                        K31.89, Other diseases of stomach and duodenum                        R10.84, Generalized abdominal pain                        K21.9, Gastro-esophageal reflux disease without                         esophagitis CPT copyright 2019 American Medical Association. All rights reserved. The codes documented in this report are preliminary and upon coder review may  be revised to meet current compliance requirements. Andrey Farmer MD, MD 09/17/2020 9:47:03 AM Number of Addenda: 0 Note Initiated On: 09/17/2020 9:11 AM Estimated Blood Loss:  Estimated blood loss was minimal.      [facility]

## 2020-09-18 ENCOUNTER — Encounter: Payer: Self-pay | Admitting: Gastroenterology

## 2020-09-19 LAB — SURGICAL PATHOLOGY

## 2020-10-03 ENCOUNTER — Other Ambulatory Visit: Payer: Self-pay | Admitting: Urology

## 2020-10-11 ENCOUNTER — Telehealth: Payer: Self-pay | Admitting: *Deleted

## 2020-10-11 NOTE — Telephone Encounter (Addendum)
Patient informed per Dr. Erlene Quan if patient is emptying and does not have clots or is in retention-encouraged to stay hydrated and keep CT and cysto appointment. Reviewed instructions in detail-voiced understanding.

## 2020-10-11 NOTE — Telephone Encounter (Signed)
Pt calling stating that he is urinating dark blood, pt was seen 09/12/2020 he has been scheduled for CT 10/18/2020. Should pt be seen in office today? Please advise

## 2020-10-18 ENCOUNTER — Other Ambulatory Visit: Payer: Medicare Other

## 2020-10-18 ENCOUNTER — Other Ambulatory Visit: Payer: Self-pay

## 2020-10-18 ENCOUNTER — Ambulatory Visit
Admission: RE | Admit: 2020-10-18 | Discharge: 2020-10-18 | Disposition: A | Discharge status: Home / Self Care | Payer: Medicare Other | Source: Ambulatory Visit | Attending: Urology | Admitting: Urology

## 2020-10-18 DIAGNOSIS — R310 Gross hematuria: Secondary | ICD-10-CM | POA: Insufficient documentation

## 2020-10-18 LAB — POCT I-STAT CREATININE: Creatinine, Ser: 1.2 mg/dL (ref 0.61–1.24)

## 2020-10-18 MED ORDER — IOHEXOL 300 MG/ML  SOLN
125.0000 mL | Freq: Once | INTRAMUSCULAR | Status: AC | PRN
  Administered 2020-10-18: 125 mL via INTRAVENOUS

## 2020-10-23 ENCOUNTER — Other Ambulatory Visit: Payer: Self-pay

## 2020-10-23 ENCOUNTER — Ambulatory Visit (INDEPENDENT_AMBULATORY_CARE_PROVIDER_SITE_OTHER): Payer: Medicare Other | Admitting: Urology

## 2020-10-23 VITALS — BP 150/88 | HR 77

## 2020-10-23 DIAGNOSIS — N200 Calculus of kidney: Secondary | ICD-10-CM | POA: Diagnosis not present

## 2020-10-23 DIAGNOSIS — R310 Gross hematuria: Secondary | ICD-10-CM

## 2020-10-23 NOTE — H&P (View-Only) (Signed)
   10/23/20  CC:  Chief Complaint  Patient presents with  . Cysto    HPI: 60 year old male with intermittent gross hematuria who presents today for cystoscopic evaluation.  Since last visit, he underwent CT urogram on 10/18/2020 which showed a nonobstructing 1.1 cm right interpolar stone as well as an incidental 3.5 cm left adrenal adenoma which is unchanged.  No other GU pathology was identified.  He does occasionally have some intermittent right flank pain that he associates with the stone.  Blood pressure (!) 150/88, pulse 77. NED. A&Ox3.   No respiratory distress   Abd soft, NT, ND Normal phallus with bilateral descended testicles  Cystoscopy Procedure Note  Patient identification was confirmed, informed consent was obtained, and patient was prepped using Betadine solution.  Lidocaine jelly was administered per urethral meatus.     Pre-Procedure: - Inspection reveals a normal caliber ureteral meatus.  Procedure: The flexible cystoscope was introduced without difficulty - No urethral strictures/lesions are present. - Enlarged prostate bilobar coaptation - Elevated bladder neck - Bilateral ureteral orifices identified - Bladder mucosa  reveals no ulcers, tumors, or lesions - No bladder stones - Mild/ moderate trabeculation  Retroflexion shows no median lobe   Post-Procedure: - Patient tolerated the procedure well  Assessment/ Plan:  1. Gross hematuria Cystoscopy is unremarkable today other than for mildly elevated prostate  CT urogram reviewed, suspect intermittent gross hematuria may be related to stone is no other pathology is identified  2. Right kidney stone We did lengthy discussion today about management options for his large stone which could include observation versus ESWL versus ureteroscopy.  Risk and benefits of each were discussed.  Based on the CT scan, he is a good candidate for shockwave lithotripsy and would prefer a less invasive option.  We  discussed the risk of this specifically including procedure failure, Steinstrasse, infection, damage surrounding structures, need for further procedures, hematoma amongst others.  All questions were answered.  He has been taking aspirin this is not a candidate for this coming week.  We will plan to move forward with the procedure next week.  All questions answered.   Hollice Espy, MD

## 2020-10-23 NOTE — Progress Notes (Signed)
   10/23/20  CC:  Chief Complaint  Patient presents with  . Cysto    HPI: 60-year-old male with intermittent gross hematuria who presents today for cystoscopic evaluation.  Since last visit, he underwent CT urogram on 10/18/2020 which showed a nonobstructing 1.1 cm right interpolar stone as well as an incidental 3.5 cm left adrenal adenoma which is unchanged.  No other GU pathology was identified.  He does occasionally have some intermittent right flank pain that he associates with the stone.  Blood pressure (!) 150/88, pulse 77. NED. A&Ox3.   No respiratory distress   Abd soft, NT, ND Normal phallus with bilateral descended testicles  Cystoscopy Procedure Note  Patient identification was confirmed, informed consent was obtained, and patient was prepped using Betadine solution.  Lidocaine jelly was administered per urethral meatus.     Pre-Procedure: - Inspection reveals a normal caliber ureteral meatus.  Procedure: The flexible cystoscope was introduced without difficulty - No urethral strictures/lesions are present. - Enlarged prostate bilobar coaptation - Elevated bladder neck - Bilateral ureteral orifices identified - Bladder mucosa  reveals no ulcers, tumors, or lesions - No bladder stones - Mild/ moderate trabeculation  Retroflexion shows no median lobe   Post-Procedure: - Patient tolerated the procedure well  Assessment/ Plan:  1. Gross hematuria Cystoscopy is unremarkable today other than for mildly elevated prostate  CT urogram reviewed, suspect intermittent gross hematuria may be related to stone is no other pathology is identified  2. Right kidney stone We did lengthy discussion today about management options for his large stone which could include observation versus ESWL versus ureteroscopy.  Risk and benefits of each were discussed.  Based on the CT scan, he is a good candidate for shockwave lithotripsy and would prefer a less invasive option.  We  discussed the risk of this specifically including procedure failure, Steinstrasse, infection, damage surrounding structures, need for further procedures, hematoma amongst others.  All questions were answered.  He has been taking aspirin this is not a candidate for this coming week.  We will plan to move forward with the procedure next week.  All questions answered.   Colleena Kurtenbach, MD 

## 2020-10-24 ENCOUNTER — Other Ambulatory Visit: Payer: Self-pay | Admitting: Urology

## 2020-10-24 DIAGNOSIS — N200 Calculus of kidney: Secondary | ICD-10-CM

## 2020-10-24 MED ORDER — CEPHALEXIN 250 MG PO CAPS: 500.0000 mg | ORAL_CAPSULE | ORAL | Status: AC

## 2020-10-30 ENCOUNTER — Other Ambulatory Visit: Admission: RE | Admit: 2020-10-30 | Payer: Medicare Other | Source: Ambulatory Visit

## 2020-11-01 ENCOUNTER — Ambulatory Visit
Admission: RE | Admit: 2020-11-01 | Discharge: 2020-11-01 | Disposition: A | Discharge status: Home / Self Care | Payer: Medicare Other | Attending: Urology | Admitting: Urology

## 2020-11-01 ENCOUNTER — Ambulatory Visit: Payer: Medicare Other

## 2020-11-01 ENCOUNTER — Encounter
Admission: RE | Disposition: A | Discharge status: Home / Self Care | Payer: Self-pay | Source: Home / Self Care | Attending: Urology

## 2020-11-01 DIAGNOSIS — N200 Calculus of kidney: Secondary | ICD-10-CM | POA: Diagnosis present

## 2020-11-01 DIAGNOSIS — R310 Gross hematuria: Secondary | ICD-10-CM | POA: Insufficient documentation

## 2020-11-01 HISTORY — PX: EXTRACORPOREAL SHOCK WAVE LITHOTRIPSY: SHX1557

## 2020-11-01 LAB — GLUCOSE, CAPILLARY: Glucose-Capillary: 112 mg/dL — ABNORMAL HIGH (ref 70–99)

## 2020-11-01 SURGERY — LITHOTRIPSY, ESWL
Anesthesia: Moderate Sedation | Laterality: Right

## 2020-11-01 MED ORDER — DIPHENHYDRAMINE HCL 25 MG PO CAPS: 25.0000 mg | ORAL_CAPSULE | ORAL | Status: AC

## 2020-11-01 MED ORDER — DIPHENHYDRAMINE HCL 25 MG PO CAPS
ORAL_CAPSULE | ORAL | Status: AC
  Filled 2020-11-01: qty 1

## 2020-11-01 MED ORDER — SODIUM CHLORIDE 0.9 % IV SOLN
INTRAVENOUS | Status: DC
Start: 2020-11-01 — End: 2020-11-01

## 2020-11-01 MED ORDER — DIAZEPAM 5 MG PO TABS
ORAL_TABLET | ORAL | Status: AC
  Administered 2020-11-01: 10 mg via ORAL
  Filled 2020-11-01: qty 2

## 2020-11-01 MED ORDER — ONDANSETRON HCL 4 MG/2ML IJ SOLN
4.0000 mg | Freq: Once | INTRAMUSCULAR | Status: AC | PRN
Start: 2020-11-01 — End: 2020-11-01

## 2020-11-01 MED ORDER — HYDROCODONE-ACETAMINOPHEN 5-325 MG PO TABS: 1.0000 | ORAL_TABLET | Freq: Three times a day (TID) | ORAL | 0 refills | Status: DC | PRN

## 2020-11-01 MED ORDER — ONDANSETRON HCL 4 MG/2ML IJ SOLN
INTRAMUSCULAR | Status: AC
  Administered 2020-11-01: 4 mg via INTRAVENOUS
  Filled 2020-11-01: qty 2

## 2020-11-01 MED ORDER — DIPHENHYDRAMINE HCL 25 MG PO CAPS
ORAL_CAPSULE | ORAL | Status: AC
  Administered 2020-11-01: 25 mg via ORAL
  Filled 2020-11-01: qty 1

## 2020-11-01 MED ORDER — ONDANSETRON HCL 4 MG/2ML IJ SOLN
INTRAMUSCULAR | Status: AC
  Filled 2020-11-01: qty 2

## 2020-11-01 MED ORDER — DIAZEPAM 5 MG PO TABS: 10.0000 mg | ORAL_TABLET | ORAL | Status: AC

## 2020-11-01 MED ORDER — DIAZEPAM 5 MG PO TABS
ORAL_TABLET | ORAL | Status: AC
  Filled 2020-11-01: qty 2

## 2020-11-01 NOTE — Interval H&P Note (Signed)
History and Physical Interval Note: Patient with a nonobstructing right renal calculus who requested shockwave lithotripsy.  Stone is dense on KUB and we discussed the stone may not fragment in 1 treatment.  The possibility of renal colic due to obstructing stone fragments were also discussed.  He indicated he understood and desires to proceed.  11/01/2020 7:36 AM  Danny Cook.  has presented today for surgery, with the diagnosis of KIDNEY STONE.  The various methods of treatment have been discussed with the patient and family. After consideration of risks, benefits and other options for treatment, the patient has consented to  Procedure(s): EXTRACORPOREAL SHOCK WAVE LITHOTRIPSY (ESWL) (Right) as a surgical intervention.  The patient's history has been reviewed, patient examined, no change in status, stable for surgery.  I have reviewed the patient's chart and labs.  Questions were answered to the patient's satisfaction.     [place]

## 2020-11-03 ENCOUNTER — Encounter (HOSPITAL_COMMUNITY): Payer: Self-pay | Admitting: Emergency Medicine

## 2020-11-03 ENCOUNTER — Other Ambulatory Visit: Payer: Self-pay

## 2020-11-03 ENCOUNTER — Emergency Department (HOSPITAL_COMMUNITY)
Admission: EM | Admit: 2020-11-03 | Discharge: 2020-11-03 | Disposition: A | Discharge status: Home / Self Care | Payer: Medicare Other | Attending: Emergency Medicine | Admitting: Emergency Medicine

## 2020-11-03 ENCOUNTER — Emergency Department (HOSPITAL_COMMUNITY): Payer: Medicare Other

## 2020-11-03 DIAGNOSIS — R531 Weakness: Secondary | ICD-10-CM | POA: Insufficient documentation

## 2020-11-03 DIAGNOSIS — Z20822 Contact with and (suspected) exposure to covid-19: Secondary | ICD-10-CM | POA: Diagnosis not present

## 2020-11-03 DIAGNOSIS — D649 Anemia, unspecified: Secondary | ICD-10-CM | POA: Diagnosis not present

## 2020-11-03 DIAGNOSIS — Z79899 Other long term (current) drug therapy: Secondary | ICD-10-CM | POA: Insufficient documentation

## 2020-11-03 DIAGNOSIS — Z21 Asymptomatic human immunodeficiency virus [HIV] infection status: Secondary | ICD-10-CM | POA: Diagnosis not present

## 2020-11-03 DIAGNOSIS — Z794 Long term (current) use of insulin: Secondary | ICD-10-CM | POA: Diagnosis not present

## 2020-11-03 DIAGNOSIS — I5032 Chronic diastolic (congestive) heart failure: Secondary | ICD-10-CM | POA: Insufficient documentation

## 2020-11-03 DIAGNOSIS — R10814 Left lower quadrant abdominal tenderness: Secondary | ICD-10-CM | POA: Diagnosis not present

## 2020-11-03 DIAGNOSIS — Z7984 Long term (current) use of oral hypoglycemic drugs: Secondary | ICD-10-CM | POA: Diagnosis not present

## 2020-11-03 DIAGNOSIS — I130 Hypertensive heart and chronic kidney disease with heart failure and stage 1 through stage 4 chronic kidney disease, or unspecified chronic kidney disease: Secondary | ICD-10-CM | POA: Insufficient documentation

## 2020-11-03 DIAGNOSIS — E1165 Type 2 diabetes mellitus with hyperglycemia: Secondary | ICD-10-CM | POA: Insufficient documentation

## 2020-11-03 DIAGNOSIS — N1831 Chronic kidney disease, stage 3a: Secondary | ICD-10-CM | POA: Diagnosis not present

## 2020-11-03 DIAGNOSIS — E1122 Type 2 diabetes mellitus with diabetic chronic kidney disease: Secondary | ICD-10-CM | POA: Diagnosis not present

## 2020-11-03 DIAGNOSIS — Z7982 Long term (current) use of aspirin: Secondary | ICD-10-CM | POA: Insufficient documentation

## 2020-11-03 DIAGNOSIS — R110 Nausea: Secondary | ICD-10-CM | POA: Insufficient documentation

## 2020-11-03 LAB — URINALYSIS, ROUTINE W REFLEX MICROSCOPIC
Bilirubin Urine: NEGATIVE
Glucose, UA: NEGATIVE mg/dL
Ketones, ur: NEGATIVE mg/dL
Leukocytes,Ua: NEGATIVE
Nitrite: NEGATIVE
Protein, ur: 100 mg/dL — AB
Specific Gravity, Urine: 1.005 (ref 1.005–1.030)
pH: 6 (ref 5.0–8.0)

## 2020-11-03 LAB — COMPREHENSIVE METABOLIC PANEL
ALT: 17 U/L (ref 0–44)
AST: 21 U/L (ref 15–41)
Albumin: 3.6 g/dL (ref 3.5–5.0)
Alkaline Phosphatase: 81 U/L (ref 38–126)
Anion gap: 10 (ref 5–15)
BUN: 13 mg/dL (ref 8–23)
CO2: 23 mmol/L (ref 22–32)
Calcium: 8.9 mg/dL (ref 8.9–10.3)
Chloride: 105 mmol/L (ref 98–111)
Creatinine, Ser: 1.29 mg/dL — ABNORMAL HIGH (ref 0.61–1.24)
GFR, Estimated: >60 mL/min (ref >60)
Glucose, Bld: 164 mg/dL — ABNORMAL HIGH (ref 70–99)
Potassium: 3.9 mmol/L (ref 3.5–5.1)
Sodium: 138 mmol/L (ref 135–145)
Total Bilirubin: 0.6 mg/dL (ref 0.3–1.2)
Total Protein: 6.9 g/dL (ref 6.5–8.1)

## 2020-11-03 LAB — CBC WITH DIFFERENTIAL/PLATELET
Abs Immature Granulocytes: 0.01 10*3/uL (ref 0.00–0.07)
Basophils Absolute: 0 10*3/uL (ref 0.0–0.1)
Basophils Relative: 0 %
Eosinophils Absolute: 0.2 10*3/uL (ref 0.0–0.5)
Eosinophils Relative: 2 %
HCT: 37.1 % — ABNORMAL LOW (ref 39.0–52.0)
Hemoglobin: 12.6 g/dL — ABNORMAL LOW (ref 13.0–17.0)
Immature Granulocytes: 0 %
Lymphocytes Relative: 32 %
Lymphs Abs: 2.3 10*3/uL (ref 0.7–4.0)
MCH: 32.6 pg (ref 26.0–34.0)
MCHC: 34 g/dL (ref 30.0–36.0)
MCV: 95.9 fL (ref 80.0–100.0)
Monocytes Absolute: 0.8 10*3/uL (ref 0.1–1.0)
Monocytes Relative: 11 %
Neutro Abs: 4.1 10*3/uL (ref 1.7–7.7)
Neutrophils Relative %: 55 %
Platelets: 171 10*3/uL (ref 150–400)
RBC: 3.87 MIL/uL — ABNORMAL LOW (ref 4.22–5.81)
RDW: 13.3 % (ref 11.5–15.5)
WBC: 7.4 10*3/uL (ref 4.0–10.5)
nRBC: 0 % (ref 0.0–0.2)

## 2020-11-03 LAB — RESP PANEL BY RT-PCR (FLU A&B, COVID) ARPGX2
Influenza A by PCR: NEGATIVE
Influenza B by PCR: NEGATIVE
SARS Coronavirus 2 by RT PCR: NEGATIVE

## 2020-11-03 LAB — BLOOD GAS, ARTERIAL
Acid-Base Excess: 2.6 mmol/L — ABNORMAL HIGH (ref 0.0–2.0)
Bicarbonate: 26.2 mmol/L (ref 20.0–28.0)
FIO2: 21
O2 Saturation: 92.3 %
Patient temperature: 36
pCO2 arterial: 43 mmHg (ref 32.0–48.0)
pH, Arterial: 7.409 (ref 7.350–7.450)
pO2, Arterial: 62.9 mmHg — ABNORMAL LOW (ref 83.0–108.0)

## 2020-11-03 LAB — LACTIC ACID, PLASMA
Lactic Acid, Venous: 2 mmol/L (ref 0.5–1.9)
Lactic Acid, Venous: 1.8 mmol/L (ref 0.5–1.9)

## 2020-11-03 LAB — LIPASE, BLOOD: Lipase: 44 U/L (ref 11–51)

## 2020-11-03 LAB — VALPROIC ACID LEVEL: Valproic Acid Lvl: 12 ug/mL — ABNORMAL LOW (ref 50.0–100.0)

## 2020-11-03 LAB — TROPONIN I (HIGH SENSITIVITY)
Troponin I (High Sensitivity): 8 ng/L (ref <18)
Troponin I (High Sensitivity): 9 ng/L (ref <18)

## 2020-11-03 MED ORDER — IOHEXOL 300 MG/ML  SOLN
100.0000 mL | Freq: Once | INTRAMUSCULAR | Status: AC | PRN
  Administered 2020-11-03: 100 mL via INTRAVENOUS

## 2020-11-03 MED ORDER — LACTATED RINGERS IV BOLUS
1000.0000 mL | Freq: Once | INTRAVENOUS | Status: AC
  Administered 2020-11-03: 1000 mL via INTRAVENOUS

## 2020-11-03 NOTE — ED Provider Notes (Signed)
.  He states that [facility] Provider Note   CSN: [id number] Arrival date & time: 11/03/20  1420     History Chief Complaint  Patient presents with  . Weakness    Beverley Duff Joby Wint. is a 60 y.o. male who presents with concern for generalized weakness x48 hours.  Patient recently underwent lithotripsy for greater than 1 cm stone in the right kidney 2 days ago experienced several episodes of dark-colored urine without frank hematuria.  Denies any abdominal pain, vomiting, diarrhea, dysuria, urinary frequency or urgency.  He does endorse mild nausea but denies fevers or chills.  Denies any chest pain or shortness of breath, denies palpitations.  Personally reviewed this patient's medical records.  He has history of CKD, HIV, diabetes.  He endorses compliance with his antiretroviral therapy.  HPI     Past Medical History:  Diagnosis Date  . Anemia   . Anxiety   . Arthritis   . CHF (congestive heart failure) ([place])   . Chronic kidney disease    Renal Insufficiency Syndrome; Glomerulosclerosis 2013  . Depressed   . Diabetes mellitus without complication ([place])   . GERD (gastroesophageal reflux disease)   . High cholesterol   . History of kidney stones   . HIV (human immunodeficiency virus infection) ([place])   . Hypertension   . 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 Paranoid disorder ([place])   . Pneumonia   . Schizophrenia, paranoid ([place])   . Sleep apnea   . TIA (transient ischemic attack)     Patient Active Problem List   Diagnosis Date Noted  . Abdominal pain 07/30/2020  . Choledocholithiasis 07/30/2020  . Hypoglycemia 07/30/2020  . Dehydration 07/30/2020  . Hyperlipidemia 07/30/2020  . Acute renal failure superimposed on stage 3a chronic kidney disease ([place]) 07/30/2020  . Acute cholecystitis 07/29/2020  . Sepsis ([place]) 08/15/2019  . NSTEMI (non-ST elevated myocardial infarction) ([place]) 06/07/2018  . Lymphedema 05/12/2018  . Pleural effusion   . FUO (fever  of unknown origin)   . MGUS (monoclonal gammopathy of unknown significance)   . 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 Healthcare associated bacterial pneumonia 04/25/2018  . Community acquired pneumonia 04/12/2018  . Pneumonia 04/12/2018  . AKI (acute kidney injury) ([place]) 03/23/2018  . Pericarditis with effusion 03/23/2018  . Schizoaffective disorder, depressive type ([place]) 11/04/2017  . HIV positive ([place]) 11/04/2017  . Orthostatic dizziness 07/31/2017  . Chronic diastolic heart failure ([place]) 07/29/2017  . HTN (hypertension) 07/29/2017  . Diabetes ([place]) 07/29/2017  . Schizophrenia ([place]) 07/29/2017    Past Surgical History:  Procedure Laterality Date  . CARDIAC CATHETERIZATION    . COLONOSCOPY WITH PROPOFOL N/A 09/03/2015   Procedure: COLONOSCOPY WITH PROPOFOL;  Surgeon: Lollie Sails, MD;  [facility] ENDOSCOPY;  Service: Endoscopy;  Laterality: N/A;  . COLONOSCOPY WITH PROPOFOL N/A 09/17/2020   Procedure: COLONOSCOPY WITH PROPOFOL;  Surgeon: Lesly Rubenstein, MD;  Location: [facility] ENDOSCOPY;  Service: Endoscopy;  Laterality: N/A;  PREFERS 9AM OR LATER DUE TO TRANSPORTATION  . DG TEETH FULL    . ESOPHAGOGASTRODUODENOSCOPY (EGD) WITH PROPOFOL N/A 07/01/2016   Dr. [name], [facility]. abnormal esophageal motility, suspicious for presbyesophagus, bile gastritis s/p biopsy, non-bleeding erosive gastropathy s/p biopsy, normal duodenum, path with negative H.pylori and +chronic active gastritis  . ESOPHAGOGASTRODUODENOSCOPY (EGD) WITH PROPOFOL N/A 09/17/2020   Procedure: ESOPHAGOGASTRODUODENOSCOPY (EGD) WITH PROPOFOL;  Surgeon: Lesly Rubenstein, MD;  Location: [facility] ENDOSCOPY;  Service: Endoscopy;  Laterality: N/A;  Family History  Problem Relation Age of Onset  . Heart attack Mother   . Diabetes Mellitus II Mother   . Mental illness Mother   . CAD Mother   . Heart attack Father   . CAD Father   . Hypertension Father     Social History   Tobacco Use   . Smoking status: Never Smoker  . Smokeless tobacco: Never Used  Vaping Use  . Vaping Use: Never used  Substance Use Topics  . Alcohol use: No  . Drug use: No    Comment: denies street drugs    Home Medications Prior to Admission medications   Medication Sig Start Date End Date Taking? Authorizing Provider  abacavir-dolutegravir-lamiVUDine (TRIUMEQ) 600-50-300 MG tablet Take 1 tablet by mouth at bedtime. 07/21/19   Tsosie Billing, MD  albuterol (PROVENTIL HFA;VENTOLIN HFA) 108 (90 Base) MCG/ACT inhaler Inhale 2 puffs into the lungs every 6 (six) hours as needed for wheezing or shortness of breath. 04/14/18   Loletha Grayer, MD  aspirin EC 325 MG tablet Take 325 mg by mouth daily.    [provider]  bumetanide (BUMEX) 1 MG tablet Take 1 mg by mouth every other day.    [provider]  butalbital-acetaminophen-caffeine (FIORICET) 50-325-40 MG tablet Take by mouth every 4 (four) hours as needed for headache.    [provider]  butalbital-aspirin-caffeine Acquanetta Chain) 50-325-40 MG capsule Take 1-2 capsules by mouth every 6 (six) hours as needed for headache.     [provider]  cyanocobalamin 1000 MCG tablet Take 1,000 mcg by mouth daily.     [provider]  diazepam (VALIUM) 5 MG tablet Take 5 mg by mouth every 12 (twelve) hours as needed for anxiety. And 2.5mg  mid day    [provider]  diphenhydrAMINE (BENADRYL) 25 mg capsule Take 50 mg by mouth at bedtime.    [provider]  divalproex (DEPAKOTE ER) 500 MG 24 hr tablet Take 500 mg by mouth at bedtime.     [provider]  docusate sodium (COLACE) 250 MG capsule Take 250 mg by mouth 2 (two) times daily.    [provider]  donepezil (ARICEPT) 10 MG tablet Take by mouth. 09/13/20   [provider]  FANAPT 12 MG TABS Take 12 mg by mouth 2 (two) times daily.  11/14/16   [provider]  ferrous sulfate 325 (65 FE) MG tablet Take 325  mg by mouth daily with breakfast.    [provider]  fluticasone (FLOVENT HFA) 220 MCG/ACT inhaler Inhale 2 puffs into the lungs 2 (two) times daily. Rinse out mouth afterwards 04/14/18   Loletha Grayer, MD  gabapentin (NEURONTIN) 400 MG capsule Take 400 mg by mouth 3 (three) times daily. 06/28/20   [provider]  gabapentin (NEURONTIN) 600 MG tablet Take 600 mg by mouth 2 (two) times daily.    [provider]  gemfibrozil (LOPID) 600 MG tablet Take 600 mg by mouth 2 (two) times daily before a meal.    [provider]  glimepiride (AMARYL) 4 MG tablet Take 4 mg by mouth daily with breakfast.  06/06/19   [provider]  glycopyrrolate (ROBINUL) 2 MG tablet Take 2 mg by mouth at bedtime. 10/15/20   [provider]  HUMALOG KWIKPEN 100 UNIT/ML KwikPen Inject into the skin. 09/10/20   [provider]  HYDROcodone-acetaminophen (NORCO/VICODIN) 5-325 MG tablet Take 1 tablet by mouth every 8 (eight) hours as needed for moderate pain. 11/01/20  Stoioff, Ronda Fairly, MD  Insulin Degludec (TRESIBA FLEXTOUCH) 200 UNIT/ML SOPN Inject 10 Units into the skin daily. Patient taking differently: Inject 50 Units into the skin daily. 08/19/19   Nolberto Hanlon, MD  meclizine (ANTIVERT) 25 MG tablet Take 25 mg by mouth 3 (three) times daily as needed for dizziness.    [provider]  memantine (NAMENDA) 10 MG tablet Take 10 mg by mouth 2 (two) times daily.    [provider]  metFORMIN (GLUCOPHAGE) 1000 MG tablet Take 1,000 mg by mouth 2 (two) times daily with a meal.    [provider]  metoprolol tartrate (LOPRESSOR) 25 MG tablet Take 12.5 mg by mouth in the morning and at bedtime. 08/20/20   [provider]  mirtazapine (REMERON) 30 MG tablet Take 30 mg by mouth at bedtime. 04/21/18   [provider]  mometasone (ELOCON) 0.1 % lotion Apply topically. 08/31/20   [provider]  mometasone (ELOCON) 0.1 %  ointment Apply topically daily. Apply 4 drops to ear canal at bedtime as needed for itching and dry skin.    [provider]  Multiple Vitamin (MULTIVITAMIN) tablet Take 1 tablet by mouth daily.    [provider]  Multiple Vitamins-Minerals [facility] C[facility] ADULTS 50+ SENIOR) TABS Take by mouth. 08/20/20   [provider]  nitroGLYCERIN (NITROSTAT) 0.4 MG SL tablet Place 0.4 mg under the tongue every 5 (five) minutes as needed for chest pain.     [provider]  nystatin cream (MYCOSTATIN) Apply 1 application topically 2 (two) times daily.    [provider]  Omega-3 Fatty Acids (FISH OIL) 1000 MG CAPS Take 1,000 mg by mouth daily.    [provider]  omeprazole (PRILOSEC) 40 MG capsule Take 40 mg by mouth 2 (two) times daily.     [provider]  ondansetron (ZOFRAN) 4 MG tablet Take 4 mg by mouth every 8 (eight) hours as needed. 08/11/20   [provider]  OZEMPIC, 1 MG/DOSE, 4 MG/3ML SOPN Inject into the skin. 10/10/20   [provider]  promethazine (PHENERGAN) 6.25 MG/5ML syrup Take 6.25 mg by mouth every 6 (six) hours as needed for nausea.    [provider]  rosuvastatin (CRESTOR) 40 MG tablet Take 40 mg by mouth daily.     [provider]  senna (SENOKOT) 8.6 MG TABS tablet Take 1 tablet by mouth daily.     [provider]  sucralfate (CARAFATE) 1 g tablet Take 1 g by mouth 4 (four) times daily.  04/21/18   [provider]  tamsulosin (FLOMAX) 0.4 MG CAPS capsule Take 0.4 mg by mouth daily. 08/02/19   [provider]  triamcinolone cream (KENALOG) 0.1 % Apply 1 application topically 2 (two) times daily as needed. Patient taking differently: Apply 1 application topically 2 (two) times daily. 11/16/16   Little, Traci M, PA-C  venlafaxine XR (EFFEXOR-XR) 75 MG 24 hr capsule Take 1 capsule (75 mg total) by mouth daily with breakfast. 11/09/17   McNew, Tyson Babinski, MD  ziprasidone  (GEODON) 40 MG capsule Take 40 mg by mouth daily.    [provider]    Allergies    Trazodone and nefazodone, Trazodone hcl, Viread [tenofovir disoproxil], and Etodolac  Review of Systems   Review of Systems  Constitutional: Positive for activity change and fatigue. Negative for appetite change, chills, diaphoresis and fever.  HENT: Negative.   Respiratory: Negative.   Cardiovascular: Negative.   Gastrointestinal: Negative.  Genitourinary: Negative for difficulty urinating, dysuria, flank pain, frequency, penile pain, penile swelling, scrotal swelling, testicular pain and urgency.  Musculoskeletal: Negative.   Neurological: Positive for weakness and headaches. Negative for dizziness, tremors, seizures, syncope, facial asymmetry, speech difficulty, light-headedness and numbness.    Physical Exam Updated Vital Signs BP 131/76   Pulse (!) 47   Resp 15   Ht 5\' 7"  (1.702 m)   Wt 97.5 kg   SpO2 95%   BMI 33.67 kg/m   Physical Exam Vitals and nursing note reviewed.  Constitutional:      General: He is not in acute distress.    Appearance: He is not toxic-appearing.  HENT:     Head: Normocephalic and atraumatic.     Nose: Nose normal.     Mouth/Throat:     Mouth: Mucous membranes are moist.     Pharynx: Oropharynx is clear. Uvula midline. No oropharyngeal exudate, posterior oropharyngeal erythema or uvula swelling.     Tonsils: No tonsillar exudate.  Eyes:     General: Lids are normal. Vision grossly intact.        Right eye: No discharge.        Left eye: No discharge.     Extraocular Movements: Extraocular movements intact.     Conjunctiva/sclera: Conjunctivae normal.     Pupils: Pupils are equal, round, and reactive to light.  Neck:     Trachea: Trachea and phonation normal.  Cardiovascular:     Rate and Rhythm: Normal rate. Rhythm irregularly irregular.     Pulses: Normal pulses.     Heart sounds: Normal heart sounds. No murmur heard.     Comments:  Intermittent bradycardia Pulmonary:     Effort: Pulmonary effort is normal. No respiratory distress.     Breath sounds: Normal breath sounds. No wheezing or rales.  Chest:     Chest wall: No mass, lacerations, deformity, swelling, tenderness, crepitus or edema.  Abdominal:     General: Bowel sounds are normal. There is no distension.     Palpations: Abdomen is soft.     Tenderness: There is abdominal tenderness in the right lower quadrant and left lower quadrant. There is no right CVA tenderness, left CVA tenderness, guarding or rebound.  Musculoskeletal:        General: No deformity.     Cervical back: Normal range of motion and neck supple. No rigidity or crepitus. No pain with movement, spinous process tenderness or muscular tenderness.     Right lower leg: No edema.     Left lower leg: No edema.  Lymphadenopathy:     Cervical: No cervical adenopathy.  Skin:    General: Skin is warm and dry.     Capillary Refill: Capillary refill takes less than 2 seconds.  Neurological:     Mental Status: He is alert and oriented to person, place, and time. Mental status is at baseline.  Psychiatric:        Mood and Affect: Mood normal.     ED Results / Procedures / Treatments   Labs (all labs ordered are listed, but only abnormal results are displayed) Labs Reviewed  URINALYSIS, ROUTINE W REFLEX MICROSCOPIC - Abnormal; Notable for the following components:      Result Value   Hgb urine dipstick LARGE (*)    Protein, ur 100 (*)    Bacteria, UA RARE (*)    All other components within normal limits  COMPREHENSIVE METABOLIC PANEL - Abnormal; Notable for the following components:  Glucose, Bld 164 (*)    Creatinine, Ser 1.29 (*)    All other components within normal limits  CBC WITH DIFFERENTIAL/PLATELET - Abnormal; Notable for the following components:   RBC 3.87 (*)    Hemoglobin 12.6 (*)    HCT 37.1 (*)    All other components within normal limits  LACTIC ACID, PLASMA - Abnormal;  Notable for the following components:   Lactic Acid, Venous 2.0 (*)    All other components within normal limits  BLOOD GAS, ARTERIAL - Abnormal; Notable for the following components:   pO2, Arterial 62.9 (*)    Acid-Base Excess 2.6 (*)    All other components within normal limits  VALPROIC ACID LEVEL - Abnormal; Notable for the following components:   Valproic Acid Lvl 12 (*)    All other components within normal limits  RESP PANEL BY RT-PCR (FLU A&B, COVID) ARPGX2  LIPASE, BLOOD  LACTIC ACID, PLASMA  TROPONIN I (HIGH SENSITIVITY)  TROPONIN I (HIGH SENSITIVITY)    EKG EKG Interpretation  Date/Time:  Saturday Nov 03 2020 17:03:46 EDT Ventricular Rate:  48 PR Interval:  242 QRS Duration: 109 QT Interval:  422 QTC Calculation: 377 R Axis:   129 Text Interpretation: Sinus or ectopic atrial bradycardia Atrial premature complexes Second deg AVB, Mobitz I (Wenckebach) Right axis deviation Low voltage, precordial leads Abnormal T, consider ischemia, lateral leads AV block now present compared withprior Confirmed by Noemi Chapel [phone]) on 11/03/2020 5:44:57 PM   Radiology CT Abdomen Pelvis W Contrast  Result Date: 11/03/2020 CLINICAL DATA:  Right lower quadrant pain. EXAM: CT ABDOMEN AND PELVIS WITH CONTRAST TECHNIQUE: Multidetector CT imaging of the abdomen and pelvis was performed using the standard protocol following bolus administration of intravenous contrast. CONTRAST:  137mL OMNIPAQUE IOHEXOL 300 MG/ML  SOLN COMPARISON:  October 18, 2020 FINDINGS: Lower chest: No acute abnormality. Hepatobiliary: No focal liver abnormality is seen. No gallstones, gallbladder wall thickening, or biliary dilatation. Pancreas: Unremarkable. No pancreatic ductal dilatation or surrounding inflammatory changes. Spleen: Normal in size without focal abnormality. Adrenals/Urinary Tract: The right adrenal gland is normal in appearance. A 3.7 cm x 3.7 cm low-attenuation left adrenal mass is seen. Kidneys are  normal in size without focal lesions. Clusters of 4 mm, 5 mm and 6 mm nonobstructing renal calculi are seen within the mid and lower right kidney. An additional cluster of multiple 2 mm and 3 mm renal stones is seen within the proximal right ureter, near the right UPJ. Moderate severity right-sided hydronephrosis is noted. Right-sided perinephric inflammatory fat stranding is also seen with delayed cortical enhancement of the right kidney. A 2 mm calcification is seen within the dependent portion of the urinary bladder on the right. The bladder is otherwise normal in appearance. Stomach/Bowel: Stomach is within normal limits. Appendix appears normal. No evidence of bowel wall thickening, distention, or inflammatory changes. Noninflamed diverticula are seen throughout the sigmoid colon. Vascular/Lymphatic: Mild aortic atherosclerosis. No enlarged abdominal or pelvic lymph nodes. Reproductive: Prostate is unremarkable. Other: No abdominal wall hernia or abnormality. No abdominopelvic ascites. Musculoskeletal: Degenerative changes are seen throughout the lumbar spine. IMPRESSION: 1. Cluster of 2 mm and 3 mm obstructing renal stones within the proximal right ureter. 2. Additional clusters of nonobstructing renal stones within the right kidney. 3. 2 mm calcification within the urinary bladder which may represent a recently passed renal stone. 4. Left adrenal adenoma. 5. Sigmoid diverticulosis. Electronically Signed   By: Virgina Norfolk M.D.   On: 11/03/2020 16:48  Procedures Procedures   Medications Ordered in ED Medications  lactated ringers bolus 1,000 mL (0 mLs Intravenous Stopped 11/03/20 1842)  iohexol (OMNIPAQUE) 300 MG/ML solution 100 mL (100 mLs Intravenous Contrast Given 11/03/20 1609)    ED Course  I have reviewed the triage vital signs and the nursing notes.  Pertinent labs & imaging results that were available during my care of the patient were reviewed by me and considered in my medical  decision making (see chart for details).    MDM Rules/Calculators/A&P                          60 year old male presents with 48 hours of weakness.  The differential diagnosis of weakness includes but is not limited to: [name] Neurologic causes: GBS, myasthenia gravis, CVA, MS, ALS, transverse myelitis, spinal cord injury, CVA, botulism . Other causes: ACS, Arrhythmia, syncope, orthostatic hypotension, sepsis, hypoglycemia, electrolyte disturbance, hypothyroidism, respiratory failure, symptomatic anemia, dehydration, heat injury, polypharmacy, malignancy.  Vital signs are normal intake.  Cardiac exam significant for irregularly irregular rhythm, pulmonary exam is normal, abdominal exam is significant for tenderness palpation of the right lower quadrant and left lower quadrants.  Neurologic exam is without focal deficit.  CBC with mild anemia, improved from patient's baseline.  CMP with hyperglycemia mildly elevated creatinine to 1.2, patient's baseline 1.1.  UA unremarkable, lipase is normal, lactic acid initially elevated to 2, repeat lactic normal at 1.8.  Troponin is negative, 9.  Respiratory pathogen panel negative for COVID-19, influenza A/B.  EKG with second-degree AV block, Wenckebach.  CT abdomen pelvis cluster of 2 and 3 mm obstructing renal stones within the proximal ureter as well as additional clusters of nonobstructing renal stones in the right kidney, expected following recent lithotripsy.  No other acute findings in the abdomen or pelvis.  Depakote level is not elevated, ABG is reassuring.  Patient reevaluated and feeling much improved after administration of IV fluids.  He was notified of his abnormal heart rhythm and recommend follow-up with cardiology.  AV block is unlikely to cause sensation of weakness.  Suspect component of dehydration for sensation of weakness; suspect sediment and stones and blood contributing to episodes of dark urine over the last few days following lithotripsy.   Recommend close urology follow-up and close cardiology consult.  No further work-up warranted in the this time.  Ediberto voiced understanding with medical evaluation and treatment plan.  Each of his questions was answered to his expressed satisfaction.  Return precautions were given.  Patient is stable and appropriate for discharge at this time.  This chart was dictated using voice recognition software, Dragon. Despite the best efforts of this provider to proofread and correct errors, errors may still occur which can change documentation meaning.  Final Clinical Impression(s) / ED Diagnoses Final diagnoses:  None    Rx / DC Orders ED Discharge Orders    None       Aura Dials 11/03/20 Joesph Fillers, MD 11/04/20 1752

## 2020-11-03 NOTE — Discharge Instructions (Addendum)
You are seen in the ER today for your sensation of weakness.  Your physical exam, blood work, CT scan were very reassuring.  Suspect you were slightly dehydrated, as you felt better after administration of IV fluids.  Your EKG did reveal an abnormal heart rhythm, which is not dangerous but does warrant follow-up with the cardiologist listed below.  Please call their office first thing on Monday to schedule follow-up appointment.  Regarding your episodes of dark urine, suspect it was the passage of the broken up stones from your right kidney following lithotripsy as well as some blood that can occur after this procedure.  This is not concerning is your urine appears to be significantly improved today.  Please follow-up with your primary care doctor, and return to the ER if you develop any worsening weakness, chest pain, shortness of breath, palpitation, nausea or vomiting does not stop, or any other new severe symptoms.

## 2020-11-03 NOTE — ED Notes (Signed)
Report given to Neoma Laming at [facility]. She is trying to find someone to come and get the patient. Waiting for a return call.

## 2020-11-03 NOTE — ED Triage Notes (Signed)
[facility] Pt recently had a lithotripsy procedure. Pt's urine dark in color. Pt c/o of weakness, tremors

## 2020-11-03 NOTE — ED Notes (Signed)
Date and time results received: 11/03/20 5:18 PM   Test:Lactic acid Critical Value: 2.0  Name of Provider Notified: Rebekah PA  Orders Received? Or Actions Taken?: see orders.

## 2020-11-05 ENCOUNTER — Encounter: Payer: Self-pay | Admitting: Urology

## 2020-11-09 ENCOUNTER — Telehealth: Payer: Self-pay | Admitting: Physician Assistant

## 2020-11-09 ENCOUNTER — Other Ambulatory Visit: Payer: Self-pay | Admitting: Physician Assistant

## 2020-11-09 MED ORDER — OXYBUTYNIN CHLORIDE 5 MG PO TABS: 5.0000 mg | ORAL_TABLET | Freq: Three times a day (TID) | ORAL | 0 refills | Status: DC | PRN

## 2020-11-09 NOTE — Telephone Encounter (Signed)
Pt LMOM stating he had a procedure done on 5/5 for kidney stones and was supposed to get muscle relaxers to help with the pain from passing stones.  He is hurting and in pain.  Please call pt [phone]

## 2020-11-15 ENCOUNTER — Other Ambulatory Visit: Payer: Self-pay

## 2020-11-15 ENCOUNTER — Ambulatory Visit (INDEPENDENT_AMBULATORY_CARE_PROVIDER_SITE_OTHER): Payer: Medicare Other | Admitting: Physician Assistant

## 2020-11-15 ENCOUNTER — Ambulatory Visit
Admission: RE | Admit: 2020-11-15 | Discharge: 2020-11-15 | Disposition: A | Discharge status: Home / Self Care | Payer: Medicare Other | Attending: Physician Assistant | Admitting: Physician Assistant

## 2020-11-15 ENCOUNTER — Encounter: Payer: Self-pay | Admitting: Physician Assistant

## 2020-11-15 ENCOUNTER — Ambulatory Visit
Admission: RE | Admit: 2020-11-15 | Discharge: 2020-11-15 | Disposition: A | Discharge status: Home / Self Care | Payer: Medicare Other | Source: Ambulatory Visit | Attending: Physician Assistant | Admitting: Physician Assistant

## 2020-11-15 VITALS — BP 114/74 | HR 83 | Ht 67.0 in | Wt 210.0 lb

## 2020-11-15 DIAGNOSIS — N200 Calculus of kidney: Secondary | ICD-10-CM | POA: Diagnosis present

## 2020-11-15 NOTE — Progress Notes (Signed)
b

## 2020-11-15 NOTE — Progress Notes (Signed)
11/15/2020 9:47 AM   Danny Cook. Aug 01, 1960 [id number]  CC: Chief Complaint  Patient presents with  . Nephrolithiasis    HPI: Danny Cook. is a 60 y.o. male with PMH intermittent gross hematuria and nephrolithiasis who underwent ESWL with Dr. Bernardo Heater 14 days ago for management of a 1.1 cm right interpolar stone who presents today for postop follow-up.  Operative note significant for apparent smudging of the stone.  Today he reports having passed several fragments since his procedure, most recently 1 week ago.  He brings in with him for analysis today.  He denies pain today and has no acute concerns.  KUB today with interval resolution of the right renal stone and stable pelvic calcifications.  In-office UA today positive for trace intact blood and 3+ protein; urine microscopy with 3-10 RBCs/HPF.   PMH: Past Medical History:  Diagnosis Date  . Anemia   . Anxiety   . Arthritis   . CHF (congestive heart failure) ([place])   . Chronic kidney disease    Renal Insufficiency Syndrome; Glomerulosclerosis 2013  . Depressed   . Diabetes mellitus without complication ([place])   . GERD (gastroesophageal reflux disease)   . High cholesterol   . History of kidney stones   . HIV (human immunodeficiency virus infection) ([place])   . Hypertension   . 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 Paranoid disorder ([place])   . Pneumonia   . Schizophrenia, paranoid ([place])   . Sleep apnea   . TIA (transient ischemic attack)     Surgical History: Past Surgical History:  Procedure Laterality Date  . CARDIAC CATHETERIZATION    . COLONOSCOPY WITH PROPOFOL N/A 09/03/2015   Procedure: COLONOSCOPY WITH PROPOFOL;  Surgeon: Lollie Sails, MD;  Location: [facility] ENDOSCOPY;  Service: Endoscopy;  Laterality: N/A;  . COLONOSCOPY WITH PROPOFOL N/A 09/17/2020   Procedure: COLONOSCOPY WITH PROPOFOL;  Surgeon: Lesly Rubenstein, MD;  Location: [facility] ENDOSCOPY;  Service: Endoscopy;  Laterality: N/A;   PREFERS 9AM OR LATER DUE TO TRANSPORTATION  . DG TEETH FULL    . ESOPHAGOGASTRODUODENOSCOPY (EGD) WITH PROPOFOL N/A 07/01/2016   Dr. [name], [facility]. abnormal esophageal motility, suspicious for presbyesophagus, bile gastritis s/p biopsy, non-bleeding erosive gastropathy s/p biopsy, normal duodenum, path with negative H.pylori and +chronic active gastritis  . ESOPHAGOGASTRODUODENOSCOPY (EGD) WITH PROPOFOL N/A 09/17/2020   Procedure: ESOPHAGOGASTRODUODENOSCOPY (EGD) WITH PROPOFOL;  Surgeon: Lesly Rubenstein, MD;  Location: [facility] ENDOSCOPY;  Service: Endoscopy;  Laterality: N/A;  . EXTRACORPOREAL SHOCK WAVE LITHOTRIPSY Right 11/01/2020   Procedure: EXTRACORPOREAL SHOCK WAVE LITHOTRIPSY (ESWL);  Surgeon: Abbie Sons, MD;  Location: [facility] ORS;  Service: Urology;  Laterality: Right;    Home Medications:  Allergies as of 11/15/2020      Reactions   Trazodone And Nefazodone    Affects other meds that they dont work   Trazodone Hcl Other (See Comments)   Viread [tenofovir Disoproxil]    Damages kidneys   Etodolac Rash      Medication List       Accurate as of Nov 15, 2020  9:47 AM. If you have any questions, ask your nurse or doctor.        albuterol 108 (90 Base) MCG/ACT inhaler Commonly known as: VENTOLIN HFA Inhale 2 puffs into the lungs every 6 (six) hours as needed for wheezing or shortness of breath.   aspirin EC 325 MG tablet Take 325 mg by mouth daily.   bumetanide 1 MG tablet Commonly known as:  BUMEX Take 1 mg by mouth every other day.   butalbital-acetaminophen-caffeine 50-325-40 MG tablet Commonly known as: FIORICET Take by mouth every 4 (four) hours as needed for headache.   butalbital-aspirin-caffeine 50-325-40 MG capsule Commonly known as: FIORINAL Take 1-2 capsules by mouth every 6 (six) hours as needed for headache.   cyanocobalamin 1000 MCG tablet Take 1,000 mcg by mouth daily.   diazepam 5 MG tablet Commonly known as: VALIUM Take 5 mg by mouth every  12 (twelve) hours as needed for anxiety. And 2.5mg  mid day   diphenhydrAMINE 25 mg capsule Commonly known as: BENADRYL Take 50 mg by mouth at bedtime.   divalproex 500 MG 24 hr tablet Commonly known as: DEPAKOTE ER Take 500 mg by mouth at bedtime.   docusate sodium 250 MG capsule Commonly known as: COLACE Take 250 mg by mouth 2 (two) times daily.   donepezil 10 MG tablet Commonly known as: ARICEPT Take by mouth.   Fanapt 12 MG Tabs Generic drug: Iloperidone Take 12 mg by mouth 2 (two) times daily.   ferrous sulfate 325 (65 FE) MG tablet Take 325 mg by mouth daily with breakfast.   Fish Oil 1000 MG Caps Take 1,000 mg by mouth daily.   fluticasone 220 MCG/ACT inhaler Commonly known as: Flovent HFA Inhale 2 puffs into the lungs 2 (two) times daily. Rinse out mouth afterwards   gabapentin 600 MG tablet Commonly known as: NEURONTIN Take 600 mg by mouth 2 (two) times daily.   gabapentin 400 MG capsule Commonly known as: NEURONTIN Take 400 mg by mouth 3 (three) times daily.   gemfibrozil 600 MG tablet Commonly known as: LOPID Take 600 mg by mouth 2 (two) times daily before a meal.   glimepiride 4 MG tablet Commonly known as: AMARYL Take 4 mg by mouth daily with breakfast.   glycopyrrolate 2 MG tablet Commonly known as: ROBINUL Take 2 mg by mouth at bedtime.   GNP Century Adults 50+ Senior Tabs Take by mouth.   HumaLOG KwikPen 100 UNIT/ML KwikPen Generic drug: insulin lispro Inject into the skin.   HYDROcodone-acetaminophen 5-325 MG tablet Commonly known as: NORCO/VICODIN Take 1 tablet by mouth every 8 (eight) hours as needed for moderate pain.   meclizine 25 MG tablet Commonly known as: ANTIVERT Take 25 mg by mouth 3 (three) times daily as needed for dizziness.   memantine 10 MG tablet Commonly known as: NAMENDA Take 10 mg by mouth 2 (two) times daily.   metFORMIN 1000 MG tablet Commonly known as: GLUCOPHAGE Take 1,000 mg by mouth 2 (two) times daily  with a meal.   metoprolol tartrate 25 MG tablet Commonly known as: LOPRESSOR Take 12.5 mg by mouth in the morning and at bedtime.   mirtazapine 30 MG tablet Commonly known as: REMERON Take 30 mg by mouth at bedtime.   mometasone 0.1 % ointment Commonly known as: ELOCON Apply topically daily. Apply 4 drops to ear canal at bedtime as needed for itching and dry skin.   mometasone 0.1 % lotion Commonly known as: ELOCON Apply topically.   multivitamin tablet Take 1 tablet by mouth daily.   nitroGLYCERIN 0.4 MG SL tablet Commonly known as: NITROSTAT Place 0.4 mg under the tongue every 5 (five) minutes as needed for chest pain.   nystatin cream Commonly known as: MYCOSTATIN Apply 1 application topically 2 (two) times daily.   omeprazole 40 MG capsule Commonly known as: PRILOSEC Take 40 mg by mouth 2 (two) times daily.   ondansetron 4  MG tablet Commonly known as: ZOFRAN Take 4 mg by mouth every 8 (eight) hours as needed.   oxybutynin 5 MG tablet Commonly known as: DITROPAN Take 1 tablet (5 mg total) by mouth every 8 (eight) hours as needed for bladder spasms.   Ozempic (1 MG/DOSE) 4 MG/3ML Sopn Generic drug: Semaglutide (1 MG/DOSE) Inject into the skin.   prazosin 2 MG capsule Commonly known as: MINIPRESS Take 2 mg by mouth at bedtime.   promethazine 6.25 MG/5ML syrup Commonly known as: PHENERGAN Take 6.25 mg by mouth every 6 (six) hours as needed for nausea.   rosuvastatin 40 MG tablet Commonly known as: CRESTOR Take 40 mg by mouth daily.   senna 8.6 MG Tabs tablet Commonly known as: SENOKOT Take 1 tablet by mouth daily.   sucralfate 1 g tablet Commonly known as: CARAFATE Take 1 g by mouth 4 (four) times daily.   tamsulosin 0.4 MG Caps capsule Commonly known as: FLOMAX Take 0.4 mg by mouth daily.   Tyler Aas FlexTouch 200 UNIT/ML FlexTouch Pen Generic drug: insulin degludec Inject 10 Units into the skin daily. What changed: how much to take    triamcinolone cream 0.1 % Commonly known as: KENALOG Apply 1 application topically 2 (two) times daily as needed. What changed: when to take this   Triumeq 600-50-300 MG tablet Generic drug: abacavir-dolutegravir-lamiVUDine Take 1 tablet by mouth at bedtime.   venlafaxine XR 75 MG 24 hr capsule Commonly known as: EFFEXOR-XR Take 1 capsule (75 mg total) by mouth daily with breakfast.   ziprasidone 40 MG capsule Commonly known as: GEODON Take 40 mg by mouth daily.       Allergies:  Allergies  Allergen Reactions  . Trazodone And Nefazodone     Affects other meds that they dont work  . Trazodone Hcl Other (See Comments)  . Viread [Tenofovir Disoproxil]     Damages kidneys  . Etodolac Rash    Family History: Family History  Problem Relation Age of Onset  . Heart attack Mother   . Diabetes Mellitus II Mother   . Mental illness Mother   . CAD Mother   . Heart attack Father   . CAD Father   . Hypertension Father     Social History:   reports that he has never smoked. He has never used smokeless tobacco. He reports that he does not drink alcohol and does not use drugs.  Physical Exam: There were no vitals taken for this visit.  Constitutional:  Alert and oriented, no acute distress, nontoxic appearing HEENT: [place], AT Cardiovascular: No clubbing, cyanosis, or edema Respiratory: Normal respiratory effort, no increased work of breathing Skin: No rashes, bruises or suspicious lesions Neurologic: Grossly intact, no focal deficits, moving all 4 extremities Psychiatric: Normal mood and affect  Laboratory Data: Results for orders placed or performed in visit on 11/15/20  Microscopic Examination   Urine  Result Value Ref Range   WBC, UA 0-5 0 - 5 /hpf   RBC 3-10 (A) 0 - 2 /hpf   Epithelial Cells (non renal) 0-10 0 - 10 /hpf   Bacteria, UA None seen None seen/Few  Urinalysis, Complete  Result Value Ref Range   Specific Gravity, UA 1.020 1.005 - 1.030   pH, UA 6.0 5.0  - 7.5   Color, UA Yellow Yellow   Appearance Ur Clear Clear   Leukocytes,UA Negative Negative   Protein,UA 3+ (A) Negative/Trace   Glucose, UA Negative Negative   Ketones, UA Negative Negative   RBC,  UA Trace (A) Negative   Bilirubin, UA Negative Negative   Urobilinogen, Ur 0.2 0.2 - 1.0 mg/dL   Nitrite, UA Negative Negative   Microscopic Examination See below:    Pertinent Imaging: KUB, 11/15/2020: CLINICAL DATA:  Nephrolithiasis  Dysuria  EXAM: ABDOMEN - 1 VIEW  COMPARISON:  CT abdomen pelvis 11/03/2020  FINDINGS: No radiopaque calculi. No dilated loops of bowel to indicate ileus or obstruction. Degenerative changes seen in the lower lumbar spine.  IMPRESSION: No radiopaque renal or ureteral calculi.   Electronically Signed   By: Miachel Roux M.D.   On: 11/16/2020 13:21  I personally reviewed the images referenced above and note interval resolution of the right renal stone with no evident ureteral fragments.  Assessment & Plan:   1. Right kidney stone Pain resolved today, multiple fragments passed, KUB negative for retained fragments, and mild microscopic hematuria on UA today.  I explained to the patient that microscopic hematuria may reflect retained fragment too small to up urine KUB versus residual minor bleeding following recent fragment passage.  Counseled him to continue pushing fluids with plans for repeat clinic visit and UA in 2 weeks.  He expressed understanding. - Urinalysis, Complete - Calculi, with Photograph (to Clinical Lab)   Return in about 2 weeks (around 11/29/2020) for Stone f/u with repeat UA.  Debroah Loop, PA-C  [facility] [address], [place], [place] [address] [phone]

## 2020-11-16 LAB — MICROSCOPIC EXAMINATION: Bacteria, UA: NONE SEEN

## 2020-11-16 LAB — URINALYSIS, COMPLETE
Bilirubin, UA: NEGATIVE
Glucose, UA: NEGATIVE
Ketones, UA: NEGATIVE
Leukocytes,UA: NEGATIVE
Nitrite, UA: NEGATIVE
Specific Gravity, UA: 1.02 (ref 1.005–1.030)
Urobilinogen, Ur: 0.2 mg/dL (ref 0.2–1.0)
pH, UA: 6 (ref 5.0–7.5)

## 2020-11-20 LAB — CALCULI, WITH PHOTOGRAPH (CLINICAL LAB)
Calcium Oxalate Monohydrate: 100 %
Weight Calculi: 54 mg

## 2020-11-29 ENCOUNTER — Ambulatory Visit (INDEPENDENT_AMBULATORY_CARE_PROVIDER_SITE_OTHER): Payer: Medicare Other | Admitting: Physician Assistant

## 2020-11-29 ENCOUNTER — Other Ambulatory Visit: Payer: Self-pay

## 2020-11-29 ENCOUNTER — Encounter: Payer: Self-pay | Admitting: Physician Assistant

## 2020-11-29 VITALS — BP 116/73 | HR 97 | Ht 67.0 in | Wt 213.0 lb

## 2020-11-29 DIAGNOSIS — N200 Calculus of kidney: Secondary | ICD-10-CM

## 2020-11-29 NOTE — Progress Notes (Signed)
11/29/2020 1:08 PM   Danny Cook. 1960/07/10 [id number]  CC: Chief Complaint  Patient presents with  . Other   HPI: Danny Cook. is a 60 y.o. male with PMH intermittent painless gross hematuria and nephrolithiasis s/p ESWL with Dr. Bernardo Heater on 11/01/2020 for management of a 1.1 cm right interpolar stone who presents today for follow-up.  I saw him in clinic 2 weeks ago, at which point he produced multiple fragments for analysis and reported no flank pain.  He had microscopic hematuria at that time.  Today he reports having passed several additional fragments since his last clinic visit.  He continues to deny flank pain or dysuria.  He has no acute concerns today.  Stone analysis has since resulted with 100% calcium oxalate monohydrate.  In-office UA today positive for trace ketones and 3+ protein; urine microscopy pan negative.  PMH: Past Medical History:  Diagnosis Date  . Anemia   . Anxiety   . Arthritis   . CHF (congestive heart failure) ([place])   . Chronic kidney disease    Renal Insufficiency Syndrome; Glomerulosclerosis 2013  . Depressed   . Diabetes mellitus without complication ([place])   . GERD (gastroesophageal reflux disease)   . High cholesterol   . History of kidney stones   . HIV (human immunodeficiency virus infection) ([place])   . Hypertension   . 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 Paranoid disorder ([place])   . Pneumonia   . Schizophrenia, paranoid ([place])   . Sleep apnea   . TIA (transient ischemic attack)     Surgical History: Past Surgical History:  Procedure Laterality Date  . CARDIAC CATHETERIZATION    . COLONOSCOPY WITH PROPOFOL N/A 09/03/2015   Procedure: COLONOSCOPY WITH PROPOFOL;  Surgeon: Lollie Sails, MD;  Location: [facility] ENDOSCOPY;  Service: Endoscopy;  Laterality: N/A;  . COLONOSCOPY WITH PROPOFOL N/A 09/17/2020   Procedure: COLONOSCOPY WITH PROPOFOL;  Surgeon: Lesly Rubenstein, MD;  Location: [facility] ENDOSCOPY;  Service:  Endoscopy;  Laterality: N/A;  PREFERS 9AM OR LATER DUE TO TRANSPORTATION  . DG TEETH FULL    . ESOPHAGOGASTRODUODENOSCOPY (EGD) WITH PROPOFOL N/A 07/01/2016   Dr. [name], [facility]. abnormal esophageal motility, suspicious for presbyesophagus, bile gastritis s/p biopsy, non-bleeding erosive gastropathy s/p biopsy, normal duodenum, path with negative H.pylori and +chronic active gastritis  . ESOPHAGOGASTRODUODENOSCOPY (EGD) WITH PROPOFOL N/A 09/17/2020   Procedure: ESOPHAGOGASTRODUODENOSCOPY (EGD) WITH PROPOFOL;  Surgeon: Lesly Rubenstein, MD;  Location: [facility] ENDOSCOPY;  Service: Endoscopy;  Laterality: N/A;  . EXTRACORPOREAL SHOCK WAVE LITHOTRIPSY Right 11/01/2020   Procedure: EXTRACORPOREAL SHOCK WAVE LITHOTRIPSY (ESWL);  Surgeon: Abbie Sons, MD;  Location: [facility] ORS;  Service: Urology;  Laterality: Right;    Home Medications:  Allergies as of 11/29/2020      Reactions   Trazodone And Nefazodone    Affects other meds that they dont work   Trazodone Hcl Other (See Comments)   Viread [tenofovir Disoproxil]    Damages kidneys   Etodolac Rash      Medication List       Accurate as of November 29, 2020  1:08 PM. If you have any questions, ask your nurse or doctor.        albuterol 108 (90 Base) MCG/ACT inhaler Commonly known as: VENTOLIN HFA Inhale 2 puffs into the lungs every 6 (six) hours as needed for wheezing or shortness of breath.   aspirin EC 325 MG tablet Take 325 mg by mouth daily.   bumetanide  1 MG tablet Commonly known as: BUMEX Take 1 mg by mouth every other day.   butalbital-acetaminophen-caffeine 50-325-40 MG tablet Commonly known as: FIORICET Take by mouth every 4 (four) hours as needed for headache.   butalbital-aspirin-caffeine 50-325-40 MG capsule Commonly known as: FIORINAL Take 1-2 capsules by mouth every 6 (six) hours as needed for headache.   cyanocobalamin 1000 MCG tablet Take 1,000 mcg by mouth daily.   diazepam 5 MG tablet Commonly known as:  VALIUM Take 5 mg by mouth every 12 (twelve) hours as needed for anxiety. And 2.5mg  mid day   diphenhydrAMINE 25 mg capsule Commonly known as: BENADRYL Take 50 mg by mouth at bedtime.   divalproex 500 MG 24 hr tablet Commonly known as: DEPAKOTE ER Take 500 mg by mouth at bedtime.   docusate sodium 250 MG capsule Commonly known as: COLACE Take 250 mg by mouth 2 (two) times daily.   donepezil 10 MG tablet Commonly known as: ARICEPT Take by mouth.   Fanapt 12 MG Tabs Generic drug: Iloperidone Take 12 mg by mouth 2 (two) times daily.   ferrous sulfate 325 (65 FE) MG tablet Take 325 mg by mouth daily with breakfast.   Fish Oil 1000 MG Caps Take 1,000 mg by mouth daily.   fluticasone 220 MCG/ACT inhaler Commonly known as: Flovent HFA Inhale 2 puffs into the lungs 2 (two) times daily. Rinse out mouth afterwards   gabapentin 600 MG tablet Commonly known as: NEURONTIN Take 600 mg by mouth 2 (two) times daily.   gabapentin 400 MG capsule Commonly known as: NEURONTIN Take 400 mg by mouth 3 (three) times daily.   gemfibrozil 600 MG tablet Commonly known as: LOPID Take 600 mg by mouth 2 (two) times daily before a meal.   glimepiride 4 MG tablet Commonly known as: AMARYL Take 4 mg by mouth daily with breakfast.   glycopyrrolate 2 MG tablet Commonly known as: ROBINUL Take 2 mg by mouth at bedtime.   GNP Century Adults 50+ Senior Tabs Take by mouth.   HumaLOG KwikPen 100 UNIT/ML KwikPen Generic drug: insulin lispro Inject into the skin.   HYDROcodone-acetaminophen 5-325 MG tablet Commonly known as: NORCO/VICODIN Take 1 tablet by mouth every 8 (eight) hours as needed for moderate pain.   meclizine 25 MG tablet Commonly known as: ANTIVERT Take 25 mg by mouth 3 (three) times daily as needed for dizziness.   memantine 10 MG tablet Commonly known as: NAMENDA Take 10 mg by mouth 2 (two) times daily.   metFORMIN 1000 MG tablet Commonly known as: GLUCOPHAGE Take 1,000  mg by mouth 2 (two) times daily with a meal.   metoprolol tartrate 25 MG tablet Commonly known as: LOPRESSOR Take 12.5 mg by mouth in the morning and at bedtime.   mirtazapine 30 MG tablet Commonly known as: REMERON Take 30 mg by mouth at bedtime.   mometasone 0.1 % ointment Commonly known as: ELOCON Apply topically daily. Apply 4 drops to ear canal at bedtime as needed for itching and dry skin.   mometasone 0.1 % lotion Commonly known as: ELOCON Apply topically.   multivitamin tablet Take 1 tablet by mouth daily.   nitroGLYCERIN 0.4 MG SL tablet Commonly known as: NITROSTAT Place 0.4 mg under the tongue every 5 (five) minutes as needed for chest pain.   nystatin cream Commonly known as: MYCOSTATIN Apply 1 application topically 2 (two) times daily.   omeprazole 40 MG capsule Commonly known as: PRILOSEC Take 40 mg by mouth 2 (two)  times daily.   ondansetron 4 MG tablet Commonly known as: ZOFRAN Take 4 mg by mouth every 8 (eight) hours as needed.   oxybutynin 5 MG tablet Commonly known as: DITROPAN Take 1 tablet (5 mg total) by mouth every 8 (eight) hours as needed for bladder spasms.   Ozempic (1 MG/DOSE) 4 MG/3ML Sopn Generic drug: Semaglutide (1 MG/DOSE) Inject into the skin.   prazosin 2 MG capsule Commonly known as: MINIPRESS Take 2 mg by mouth at bedtime.   promethazine 6.25 MG/5ML syrup Commonly known as: PHENERGAN Take 6.25 mg by mouth every 6 (six) hours as needed for nausea.   rosuvastatin 40 MG tablet Commonly known as: CRESTOR Take 40 mg by mouth daily.   senna 8.6 MG Tabs tablet Commonly known as: SENOKOT Take 1 tablet by mouth daily.   sucralfate 1 g tablet Commonly known as: CARAFATE Take 1 g by mouth 4 (four) times daily.   tamsulosin 0.4 MG Caps capsule Commonly known as: FLOMAX Take 0.4 mg by mouth daily.   Tyler Aas FlexTouch 200 UNIT/ML FlexTouch Pen Generic drug: insulin degludec Inject 10 Units into the skin daily. What  changed: how much to take   triamcinolone cream 0.1 % Commonly known as: KENALOG Apply 1 application topically 2 (two) times daily as needed. What changed: when to take this   Triumeq 600-50-300 MG tablet Generic drug: abacavir-dolutegravir-lamiVUDine Take 1 tablet by mouth at bedtime.   venlafaxine XR 75 MG 24 hr capsule Commonly known as: EFFEXOR-XR Take 1 capsule (75 mg total) by mouth daily with breakfast.   ziprasidone 40 MG capsule Commonly known as: GEODON Take 40 mg by mouth daily.       Allergies:  Allergies  Allergen Reactions  . Trazodone And Nefazodone     Affects other meds that they dont work  . Trazodone Hcl Other (See Comments)  . Viread [Tenofovir Disoproxil]     Damages kidneys  . Etodolac Rash    Family History: Family History  Problem Relation Age of Onset  . Heart attack Mother   . Diabetes Mellitus II Mother   . Mental illness Mother   . CAD Mother   . Heart attack Father   . CAD Father   . Hypertension Father     Social History:   reports that he has never smoked. He has never used smokeless tobacco. He reports that he does not drink alcohol and does not use drugs.  Physical Exam: BP 116/73   Pulse 97   Ht 5\' 7"  (1.702 m)   Wt 213 lb (96.6 kg)   BMI 33.36 kg/m   Constitutional:  Alert and oriented, no acute distress, nontoxic appearing HEENT: [place], AT Cardiovascular: No clubbing, cyanosis, or edema Respiratory: Normal respiratory effort, no increased work of breathing Skin: No rashes, bruises or suspicious lesions Neurologic: Grossly intact, no focal deficits, moving all 4 extremities Psychiatric: Normal mood and affect  Laboratory Data: Results for orders placed or performed in visit on 11/29/20  Microscopic Examination   Urine  Result Value Ref Range   WBC, UA 0-5 0 - 5 /hpf   RBC 0-2 0 - 2 /hpf   Epithelial Cells (non renal) None seen 0 - 10 /hpf   Bacteria, UA None seen None seen/Few  Urinalysis, Complete  Result Value  Ref Range   Specific Gravity, UA >1.030 (H) 1.005 - 1.030   pH, UA 5.5 5.0 - 7.5   Color, UA Orange Yellow   Appearance Ur Clear Clear  Leukocytes,UA Negative Negative   Protein,UA 3+ (A) Negative/Trace   Glucose, UA Negative Negative   Ketones, UA Trace (A) Negative   RBC, UA Negative Negative   Bilirubin, UA Negative Negative   Urobilinogen, Ur 1.0 0.2 - 1.0 mg/dL   Nitrite, UA Negative Negative   Microscopic Examination See below:    Assessment & Plan:   1. Right kidney stone Additional fragments passed and MH resolved today with no residual pain.  I explained that if he were retaining any fragments at this point, I would expect them to be small enough to pass spontaneously.  He expressed understanding.  We discussed stone prevention recommendations today including increasing fluid intake, decreasing oxalate intake, maintaining moderate dietary calcium, decreasing sodium intake, and increasing citrate intake.  Verbal and written resources provided today. - Urinalysis, Complete  Return in about 1 year (around 11/29/2021) for Annual stone/GH follow-up with UA and KUB prior.  Debroah Loop, PA-C  [facility] [address] [place], [place] [address] [phone]

## 2020-11-30 LAB — URINALYSIS, COMPLETE
Bilirubin, UA: NEGATIVE
Glucose, UA: NEGATIVE
Leukocytes,UA: NEGATIVE
Nitrite, UA: NEGATIVE
RBC, UA: NEGATIVE
Specific Gravity, UA: >1.03 — ABNORMAL HIGH (ref 1.005–1.030)
Urobilinogen, Ur: 1 mg/dL (ref 0.2–1.0)
pH, UA: 5.5 (ref 5.0–7.5)

## 2020-11-30 LAB — MICROSCOPIC EXAMINATION
Bacteria, UA: NONE SEEN
Epithelial Cells (non renal): NONE SEEN /hpf (ref 0–10)

## 2020-12-27 ENCOUNTER — Other Ambulatory Visit (HOSPITAL_COMMUNITY): Payer: Self-pay

## 2020-12-27 ENCOUNTER — Telehealth: Payer: Self-pay

## 2020-12-27 NOTE — Telephone Encounter (Signed)
[facility] Patient Advocate Encounter  [facility] Reap is covered under the patient Pharmacy Benefits Copay is $0.00.  Ileene Patrick, [facility] Patient [facility] Phone: [phone] Fax:  [phone]

## 2021-01-01 ENCOUNTER — Ambulatory Visit: Payer: Medicare Other | Admitting: Cardiovascular Disease

## 2021-01-10 ENCOUNTER — Ambulatory Visit: Payer: Medicare Other | Admitting: Infectious Diseases

## 2021-02-05 ENCOUNTER — Ambulatory Visit: Payer: Medicare Other | Attending: Infectious Diseases | Admitting: Infectious Diseases

## 2021-02-05 ENCOUNTER — Other Ambulatory Visit: Payer: Self-pay

## 2021-02-05 VITALS — BP 117/77 | HR 75 | Temp 97.8°F | Resp 16 | Ht 67.0 in | Wt 213.0 lb

## 2021-02-05 DIAGNOSIS — D649 Anemia, unspecified: Secondary | ICD-10-CM | POA: Insufficient documentation

## 2021-02-05 DIAGNOSIS — Z7982 Long term (current) use of aspirin: Secondary | ICD-10-CM | POA: Diagnosis not present

## 2021-02-05 DIAGNOSIS — I130 Hypertensive heart and chronic kidney disease with heart failure and stage 1 through stage 4 chronic kidney disease, or unspecified chronic kidney disease: Secondary | ICD-10-CM | POA: Insufficient documentation

## 2021-02-05 DIAGNOSIS — M549 Dorsalgia, unspecified: Secondary | ICD-10-CM | POA: Insufficient documentation

## 2021-02-05 DIAGNOSIS — M48062 Spinal stenosis, lumbar region with neurogenic claudication: Secondary | ICD-10-CM | POA: Diagnosis not present

## 2021-02-05 DIAGNOSIS — F200 Paranoid schizophrenia: Secondary | ICD-10-CM | POA: Diagnosis not present

## 2021-02-05 DIAGNOSIS — I509 Heart failure, unspecified: Secondary | ICD-10-CM | POA: Insufficient documentation

## 2021-02-05 DIAGNOSIS — N189 Chronic kidney disease, unspecified: Secondary | ICD-10-CM | POA: Diagnosis not present

## 2021-02-05 DIAGNOSIS — Z794 Long term (current) use of insulin: Secondary | ICD-10-CM | POA: Insufficient documentation

## 2021-02-05 DIAGNOSIS — Z79899 Other long term (current) drug therapy: Secondary | ICD-10-CM | POA: Diagnosis not present

## 2021-02-05 DIAGNOSIS — E781 Pure hyperglyceridemia: Secondary | ICD-10-CM | POA: Diagnosis not present

## 2021-02-05 DIAGNOSIS — E1122 Type 2 diabetes mellitus with diabetic chronic kidney disease: Secondary | ICD-10-CM | POA: Diagnosis not present

## 2021-02-05 DIAGNOSIS — Z8249 Family history of ischemic heart disease and other diseases of the circulatory system: Secondary | ICD-10-CM | POA: Insufficient documentation

## 2021-02-05 DIAGNOSIS — B20 Human immunodeficiency virus [HIV] disease: Secondary | ICD-10-CM | POA: Insufficient documentation

## 2021-02-05 DIAGNOSIS — Z7901 Long term (current) use of anticoagulants: Secondary | ICD-10-CM | POA: Diagnosis not present

## 2021-02-05 DIAGNOSIS — Z7951 Long term (current) use of inhaled steroids: Secondary | ICD-10-CM | POA: Diagnosis not present

## 2021-02-05 DIAGNOSIS — E785 Hyperlipidemia, unspecified: Secondary | ICD-10-CM | POA: Diagnosis not present

## 2021-02-05 DIAGNOSIS — Z7984 Long term (current) use of oral hypoglycemic drugs: Secondary | ICD-10-CM | POA: Diagnosis not present

## 2021-02-05 DIAGNOSIS — G43909 Migraine, unspecified, not intractable, without status migrainosus: Secondary | ICD-10-CM | POA: Insufficient documentation

## 2021-02-05 DIAGNOSIS — M79606 Pain in leg, unspecified: Secondary | ICD-10-CM | POA: Insufficient documentation

## 2021-02-05 NOTE — Patient Instructions (Signed)
You are here for follow up. Last Vl < 20 and cd4 is > 600. Continue triumeq. Will do genosure archive with next lab and if okay cam change to Dovato

## 2021-02-05 NOTE — Progress Notes (Signed)
NAME: Danny Cook.  DOB: July 27, 1960  MRN: [id number]  Date/Time: 02/05/2021 9:06 AM   Subjective:  Follow up visit- last seen in jan 2022  Pt currently lives in group home [facility] care Danny Cook ?Danny Cook. is a 60  y.o. male with a history of HIV/DM/CHF/schizophrenia, anxiety Since his last visit he was in the hospital in Feb 2022 for abdominal pain and cholecystitis was suspected He underwent EGD/colonscopy on 09/17/20-N April 2022 gross hematuria, CT urogram on 10/18/20 showed a non obstructing 1.1 cm right interpolar stone He had lithotripsy on 11/01/20 Went to ED on 5/7 with weakness- he was not drinking fluids  He is doing better now No pain , no hematuria He saw his PCP on July 1 for urinary retention and started on tamsulosin He saw cardiology in July 2022   Currently on Triumeq and is 100% adherent. Doing well No new complaints Last vl <20 and cd4 752was from Jan 2022 Four vaccines for Covid Not sexually active in many years   Following taken from last visit HIV diagnosed 3s Nadir Cd4 <50 HAARt history Triumeq since 2016 Prior regimen- epzicom+ reyataz+norvir   Acquired thru sex with men Genotype-NK   Medical History CKD FSGS Hyperlipidemia Anemia- small M spike-  Pericardial effusion - resolved Left pleural effusion HTN Adrenal adenoma High Esr- much improved from > 140 to 35  Neg Autoimmune work up  Remal stome left lithotripsy  ? ?   Past Surgical History:  Procedure Laterality Date   CARDIAC CATHETERIZATION     COLONOSCOPY WITH PROPOFOL N/A 09/03/2015   Procedure: COLONOSCOPY WITH PROPOFOL;  Surgeon: Lollie Sails, MD;  Location: [facility] ENDOSCOPY;  Service: Endoscopy;  Laterality: N/A;   COLONOSCOPY WITH PROPOFOL N/A 09/17/2020   Procedure: COLONOSCOPY WITH PROPOFOL;  Surgeon: Lesly Rubenstein, MD;  Location: [facility] ENDOSCOPY;  Service: Endoscopy;  Laterality: N/A;  PREFERS 9AM OR LATER DUE TO TRANSPORTATION   DG TEETH  FULL     ESOPHAGOGASTRODUODENOSCOPY (EGD) WITH PROPOFOL N/A 07/01/2016   Dr. [name], [facility]. abnormal esophageal motility, suspicious for presbyesophagus, bile gastritis s/p biopsy, non-bleeding erosive gastropathy s/p biopsy, normal duodenum, path with negative H.pylori and +chronic active gastritis   ESOPHAGOGASTRODUODENOSCOPY (EGD) WITH PROPOFOL N/A 09/17/2020   Procedure: ESOPHAGOGASTRODUODENOSCOPY (EGD) WITH PROPOFOL;  Surgeon: Lesly Rubenstein, MD;  Location: [facility] ENDOSCOPY;  Service: Endoscopy;  Laterality: N/A;   EXTRACORPOREAL SHOCK WAVE LITHOTRIPSY Right 11/01/2020   Procedure: EXTRACORPOREAL SHOCK WAVE LITHOTRIPSY (ESWL);  Surgeon: Abbie Sons, MD;  Location: [facility] ORS;  Service: Urology;  Laterality: Right;    Social History   Socioeconomic History   Marital status: Single    Spouse name: Not on file   Number of children: Not on file   Years of education: 11   Highest education level: GED or equivalent  Occupational History   Not on file  Tobacco Use   Smoking status: Never   Smokeless tobacco: Never  Vaping Use   Vaping Use: Never used  Substance and Sexual Activity   Alcohol use: No   Drug use: No    Comment: denies street drugs   Sexual activity: Never  Other Topics Concern   Not on file  Social History Narrative   Not on file   Social Determinants of Health   Financial Resource Strain: Not on file  Food Insecurity: Not on file  Transportation Needs: Not on file  Physical Activity: Not on file  Stress: Not on file  Social  Connections: Not on file  Intimate Partner Violence: Not on file    Family History  Problem Relation Age of Onset   Heart attack Mother    Diabetes Mellitus II Mother    Mental illness Mother    CAD Mother    Heart attack Father    CAD Father    Hypertension Father    Allergies  Allergen Reactions   Trazodone And Nefazodone     Affects other meds that they dont work   Trazodone Hcl Other (See Comments)   Viread  [Tenofovir Disoproxil]     Damages kidneys   Etodolac Rash   ? Current Outpatient Medications  Medication Sig Dispense Refill   abacavir-dolutegravir-lamiVUDine (TRIUMEQ) 600-50-300 MG tablet Take 1 tablet by mouth at bedtime. 30 tablet 6   albuterol (PROVENTIL HFA;VENTOLIN HFA) 108 (90 Base) MCG/ACT inhaler Inhale 2 puffs into the lungs every 6 (six) hours as needed for wheezing or shortness of breath. 1 Inhaler 2   aspirin EC 325 MG tablet Take 325 mg by mouth daily.     bumetanide (BUMEX) 1 MG tablet Take 1 mg by mouth every other day.     butalbital-acetaminophen-caffeine (FIORICET) 50-325-40 MG tablet Take by mouth every 4 (four) hours as needed for headache.     butalbital-aspirin-caffeine (FIORINAL) 50-325-40 MG capsule Take 1-2 capsules by mouth every 6 (six) hours as needed for headache.      cyanocobalamin 1000 MCG tablet Take 1,000 mcg by mouth daily.      diazepam (VALIUM) 5 MG tablet Take 5 mg by mouth every 12 (twelve) hours as needed for anxiety. And 2.25m mid day     diphenhydrAMINE (BENADRYL) 25 mg capsule Take 50 mg by mouth at bedtime.     divalproex (DEPAKOTE ER) 500 MG 24 hr tablet Take 500 mg by mouth at bedtime.      docusate sodium (COLACE) 250 MG capsule Take 250 mg by mouth 2 (two) times daily.     donepezil (ARICEPT) 10 MG tablet Take by mouth.     doxepin (SINEQUAN) 50 MG capsule Take 50 mg by mouth at bedtime.     FANAPT 12 MG TABS Take 12 mg by mouth 2 (two) times daily.      ferrous sulfate 325 (65 FE) MG tablet Take 325 mg by mouth daily with breakfast.     fluticasone (FLOVENT HFA) 220 MCG/ACT inhaler Inhale 2 puffs into the lungs 2 (two) times daily. Rinse out mouth afterwards 1 Inhaler 12   gabapentin (NEURONTIN) 400 MG capsule Take 400 mg by mouth 3 (three) times daily.     gabapentin (NEURONTIN) 600 MG tablet Take 600 mg by mouth 2 (two) times daily.     gemfibrozil (LOPID) 600 MG tablet Take 600 mg by mouth 2 (two) times daily before a meal.      glimepiride (AMARYL) 4 MG tablet Take 4 mg by mouth daily with breakfast.      glycopyrrolate (ROBINUL) 2 MG tablet Take 2 mg by mouth at bedtime.     HUMALOG KWIKPEN 100 UNIT/ML KwikPen Inject into the skin.     HYDROcodone-acetaminophen (NORCO/VICODIN) 5-325 MG tablet Take 1 tablet by mouth every 8 (eight) hours as needed for moderate pain. 15 tablet 0   Insulin Degludec (TRESIBA FLEXTOUCH) 200 UNIT/ML SOPN Inject 10 Units into the skin daily. (Patient taking differently: Inject 50 Units into the skin daily.)     meclizine (ANTIVERT) 25 MG tablet Take 25 mg by mouth 3 (three) times  daily as needed for dizziness.     memantine (NAMENDA) 10 MG tablet Take 10 mg by mouth 2 (two) times daily.     metFORMIN (GLUCOPHAGE) 1000 MG tablet Take 1,000 mg by mouth 2 (two) times daily with a meal.     metoprolol tartrate (LOPRESSOR) 25 MG tablet Take 12.5 mg by mouth in the morning and at bedtime.     mirtazapine (REMERON) 30 MG tablet Take 30 mg by mouth at bedtime.     mometasone (ELOCON) 0.1 % lotion Apply topically.     mometasone (ELOCON) 0.1 % ointment Apply topically daily. Apply 4 drops to ear canal at bedtime as needed for itching and dry skin.     Multiple Vitamin (MULTIVITAMIN) tablet Take 1 tablet by mouth daily.     Multiple Vitamins-Minerals (GNP CENTURY ADULTS 50+ SENIOR) TABS Take by mouth.     nitroGLYCERIN (NITROSTAT) 0.4 MG SL tablet Place 0.4 mg under the tongue every 5 (five) minutes as needed for chest pain.      nystatin cream (MYCOSTATIN) Apply 1 application topically 2 (two) times daily.     Omega-3 Fatty Acids (FISH OIL) 1000 MG CAPS Take 1,000 mg by mouth daily.     omeprazole (PRILOSEC) 40 MG capsule Take 40 mg by mouth 2 (two) times daily.      ondansetron (ZOFRAN) 4 MG tablet Take 4 mg by mouth every 8 (eight) hours as needed.     oxybutynin (DITROPAN) 5 MG tablet Take 1 tablet (5 mg total) by mouth every 8 (eight) hours as needed for bladder spasms. 30 tablet 0   OZEMPIC, 1  MG/DOSE, 4 MG/3ML SOPN Inject into the skin.     prazosin (MINIPRESS) 2 MG capsule Take 2 mg by mouth at bedtime.     promethazine (PHENERGAN) 6.25 MG/5ML syrup Take 6.25 mg by mouth every 6 (six) hours as needed for nausea.     rosuvastatin (CRESTOR) 40 MG tablet Take 40 mg by mouth daily.      senna (SENOKOT) 8.6 MG TABS tablet Take 1 tablet by mouth daily.      sucralfate (CARAFATE) 1 g tablet Take 1 g by mouth 4 (four) times daily.      tamsulosin (FLOMAX) 0.4 MG CAPS capsule Take 0.4 mg by mouth daily.     triamcinolone cream (KENALOG) 0.1 % Apply 1 application topically 2 (two) times daily as needed. (Patient taking differently: Apply 1 application topically 2 (two) times daily.) 30 g 0   venlafaxine XR (EFFEXOR-XR) 75 MG 24 hr capsule Take 1 capsule (75 mg total) by mouth daily with breakfast. 30 capsule 0   ziprasidone (GEODON) 40 MG capsule Take 40 mg by mouth daily.     No current facility-administered medications for this visit.    REVIEW OF SYSTEMS:  Const: negative fever, negative chills, negative weight loss Eyes: negative diplopia or visual changes, negative eye pain ENT: negative coryza, negative sore throat Resp: negative cough, hemoptysis, dyspnea Cards: negative for chest pain, palpitations, lower extremity edema GU: negative for frequency, dysuria and hematuria Skin: negative for rash and pruritus Heme: negative for easy bruising and gum/nose bleeding MS: , back pain and leg pain. muscle weakness Neurolo: headaches memory problems  Psych: schizhoprenia Objective:  VITALS:  BP 117/77   Pulse 75   Temp 97.8 F (36.6 C) (Oral)   Resp 16   Ht 5' 7"  (1.702 m)   Wt 213 lb (96.6 kg)   SpO2 95%   BMI 33.36  kg/m PHYSICAL EXAM:  General:  Flat affect, no distress,  Head: Normocephalic, without obvious abnormality, atraumatic. Eyes:PERRLA No nystagmus Nose:did not examine as he ahs mask Neck: Supple, symmetrical, no adenopathy, thyroid: non tender no carotid bruit  and no JVD. Lungs: Clear to auscultation bilaterally. No Wheezing or Rhonchi. No rales. Heart: Regular rate and rhythm, no murmur, rub or gallop. Abdomen: obese Extremities: Extremities normal, atraumatic, no cyanosis. No edema. No clubbing Skin- dry and scaly Lymph: Cervical, supraclavicular normal. Neurologic: Grossly non-focal Health maintenance Vaccination  Vaccine Date last given comment  Influenza 03/15/18   Hepatitis B    Hepatitis A    Prevnar-PCV-13 08/10/18   Pneumovac-PPSV-23 08/01/17   TdaP 08/10/18   HPV    Shingrix ( zoster vaccine)     ______________________  Labs Lab Result  Date comment  HIV VL <20 12/30/18   CD4 1030 ( 36) 12/30/18   Genotype     [id number]     HIV antibody     RPR NR 07/21/19   Quantiferon Gold NEG 04/28/18   Hep C ab NR 07/12/20   Hepatitis B-ab,ag,c Core ab positive 04/16/15   Hepatitis A-IgM, IgG /T     Lipid 156/219/38/74 12/12/19   GC/CHL     PAP     HB,PLT,Cr, LFT 13./212/1.12/N 07/12/20     Preventive  Procedure Result  Date comment  colonoscopy N 09/17/20 hemorrhoids       Dental exam edentulous    Opthal       Impression/Recommendation  ? ?AIDS : well controlled- last Cd4 from Jan 2021 is 850 and Vl 40. On triumeq-100% adherent. ? 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  Bone marrow(04/30/18) showed hypercellular bone marrow with 4% plasma cells negative for Congo red stain. Serum electrophoresis had a small M spike- unclear significance   DM-on liraglutide ( victoza) and Tresiba, metformin and  glimepiride- saw his endocrinologist today   Hyperlipidemia on Rosuvastatin  Hypertriglyceridemia-  on gemfibrozil- normalized  HTN -losartan, metoprolol   paranoid schizophrenia   Fully vaccinated for covid-2  boosters received  Monkey po discussed and he has no risk for it- not been sexually active or in close contact with a male partner in many years ? Follow-up 6 months ?  Discussed with patient in detail

## 2021-02-21 ENCOUNTER — Other Ambulatory Visit: Payer: Self-pay | Admitting: Neurosurgery

## 2021-02-21 DIAGNOSIS — M4807 Spinal stenosis, lumbosacral region: Secondary | ICD-10-CM

## 2021-03-04 NOTE — Progress Notes (Signed)
Patient ID: Danny Cook., male    DOB: 09-10-1960, 60 y.o.   MRN: [id number]  HPI  Mr Pistone is a 60 y/o male with a history of DM, hyperlipidemia, HTN, CKD, HIV, schizophrenia, obstructive sleep apnea, anxiety, osteoarthritis and chronic heart failure.   Echo report from 07/25/20 reviewed and showed an EF of 55-60% along with mild LVH. Echo report from 06/08/18 reviewed and showed an EF of 55-60%. Echo report from 03/25/18 reviewed and showed an EF of 55-60%. Echo report from 07/31/17 reviewed and showed an EF of 55-65%. Echo report from 05/11/17 reviewed and showed an EF of 55% along with mild AR/ TR/ MR.  Was in the ED 11/03/20 due to weakness. Recently treated for kidney stone. Given IVF. Symptoms improved and he was released.     He presents today for a follow-up visit with a chief complaint of moderate shortness of breath upon minimal exertion. He describes this as chronic in nature having been present for several years. He has associated fatigue, pedal edema (L>R), anxiety, leg weakness and chronic back pain along with this. He denies any difficulty sleeping, dizziness, abdominal distention, palpitations, chest pain, cough or weight gain.   Admits to not being very active where he's currently at. He says that he's hoping to move soon to a place that has a gated community and he's around more people.   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[place])    Paranoid disorder ([place])    Pneumonia    Schizophrenia, paranoid ([place])    Sleep apnea    TIA (transient ischemic attack)    Past  Surgical History:  Procedure Laterality Date   CARDIAC CATHETERIZATION     COLONOSCOPY WITH PROPOFOL N/A 09/03/2015   Procedure: COLONOSCOPY WITH PROPOFOL;  Surgeon: Lollie Sails, MD;  Location: [facility] ENDOSCOPY;  Service: Endoscopy;  Laterality: N/A;   COLONOSCOPY WITH PROPOFOL N/A 09/17/2020   Procedure: COLONOSCOPY WITH PROPOFOL;  Surgeon: Lesly Rubenstein, MD;  Location: [facility] ENDOSCOPY;  Service: Endoscopy;  Laterality: N/A;  PREFERS 9AM OR LATER DUE TO TRANSPORTATION   DG TEETH FULL     ESOPHAGOGASTRODUODENOSCOPY (EGD) WITH PROPOFOL N/A 07/01/2016   Dr. [name], [facility]. abnormal esophageal motility, suspicious for presbyesophagus, bile gastritis s/p biopsy, non-bleeding erosive gastropathy s/p biopsy, normal duodenum, path with negative H.pylori and +chronic active gastritis   ESOPHAGOGASTRODUODENOSCOPY (EGD) WITH PROPOFOL N/A 09/17/2020   Procedure: ESOPHAGOGASTRODUODENOSCOPY (EGD) WITH PROPOFOL;  Surgeon: Lesly Rubenstein, MD;  Location: [facility] ENDOSCOPY;  Service: Endoscopy;  Laterality: N/A;   EXTRACORPOREAL SHOCK WAVE LITHOTRIPSY Right 11/01/2020   Procedure: EXTRACORPOREAL SHOCK WAVE LITHOTRIPSY (ESWL);  Surgeon: Abbie Sons, MD;  Location: [facility] ORS;  Service: Urology;  Laterality: Right;   Family History  Problem Relation Age of Onset   Heart attack Mother    Diabetes Mellitus II Mother    Mental illness Mother    CAD Mother    Heart attack Father    CAD Father    Hypertension Father  Social History   Tobacco Use   Smoking status: Never   Smokeless tobacco: Never  Substance Use Topics   Alcohol use: No   Allergies  Allergen Reactions   Trazodone And Nefazodone     Affects other meds that they dont work   Trazodone Hcl Other (See Comments)   Viread [Tenofovir Disoproxil]     Damages kidneys   Etodolac Rash   Vitals:   03/05/21 0912  BP: 124/75  Pulse: 77  Resp: 20  SpO2: 99%  Weight: 210 lb (95.3 kg)  Height: '5\' 7"'$  (1.702 m)   Wt Readings from  Last 3 Encounters:  03/05/21 210 lb (95.3 kg)  02/05/21 213 lb (96.6 kg)  11/29/20 213 lb (96.6 kg)   Review of Systems  Constitutional:  Positive for fatigue. Negative for appetite change.  HENT:  Positive for rhinorrhea. Negative for congestion, postnasal drip and sore throat.   Eyes: Negative.   Respiratory:  Positive for shortness of breath (easily). Negative for cough and chest tightness.   Cardiovascular:  Positive for leg swelling (L>R). Negative for chest pain and palpitations.  Gastrointestinal:  Negative for abdominal distention and abdominal pain.  Endocrine: Negative.   Genitourinary: Negative.   Musculoskeletal:  Positive for back pain (when standing for long periods of time). Negative for neck pain.  Skin: Negative.   Allergic/Immunologic: Negative.   Neurological:  Positive for weakness (leg weakness). Negative for dizziness and light-headedness.  Hematological:  Negative for adenopathy. Does not bruise/bleed easily.  Psychiatric/Behavioral:  Negative for dysphoric mood and sleep disturbance. The patient is nervous/anxious.    Vitals:   03/05/21 0912  BP: 124/75  Pulse: 77  Resp: 20  SpO2: 99%  Weight: 210 lb (95.3 kg)  Height: '5\' 7"'$  (1.702 m)   Wt Readings from Last 3 Encounters:  03/05/21 210 lb (95.3 kg)  02/05/21 213 lb (96.6 kg)  11/29/20 213 lb (96.6 kg)   Lab Results  Component Value Date   CREATININE 1.29 (H) 11/03/2020   CREATININE 1.20 10/18/2020   CREATININE 1.16 07/31/2020    Physical Exam Vitals and nursing note reviewed.  Constitutional:      Appearance: He is well-developed.  HENT:     Head: Normocephalic and atraumatic.  Neck:     Vascular: No JVD.  Cardiovascular:     Rate and Rhythm: Normal rate and regular rhythm.  Pulmonary:     Effort: Pulmonary effort is normal.     Breath sounds: No wheezing or rales.  Abdominal:     General: There is no distension.     Palpations: Abdomen is soft.     Tenderness: There is no abdominal  tenderness.  Musculoskeletal:        General: No tenderness.     Cervical back: Normal range of motion and neck supple.     Right lower leg: No tenderness. No edema.     Left lower leg: No tenderness. No edema.  Skin:    General: Skin is warm and dry.  Neurological:     General: No focal deficit present.     Mental Status: He is alert and oriented to person, place, and time.  Psychiatric:        Mood and Affect: Mood normal.        Behavior: Behavior normal.        Thought Content: Thought content normal.   Assessment & Plan:  1: Chronic heart failure with preserved ejection fraction- - NYHA class III - euvolemic  today - weighing daily; reminded to call for an overnight weight gain of >2 pounds or a weekly weight gain of >5 pounds - weight down 5 pounds from last visit here 6 months ago - not adding salt to his food and doesn't think the food at the family care home tastes salty - saw cardiology ([place]) 01/23/21; returns later this month - encouraged him to be more active as he's probably getting deconditioned - BNP 07/29/20 was 31.0  2: HTN- - BP looks good today - BMP from 01/03/21 reviewed and showed sodium 140, potassium 4.6, creatinine 1.5 and GFR 61 - saw PCP Ola Spurr) 7/7/222  3: Diabetes- - A1c 11/28/20 was 6.9% - saw endocrinologist Jenetta DownerDina Rich) 11/28/20 - saw nephrologist Radene Knee) 01/15/21   Facility medication list was reviewed.  Return in 6 months or sooner for any questions/problems before then.

## 2021-03-05 ENCOUNTER — Ambulatory Visit: Payer: Medicare Other | Attending: Family | Admitting: Family

## 2021-03-05 ENCOUNTER — Encounter: Payer: Self-pay | Admitting: Family

## 2021-03-05 ENCOUNTER — Other Ambulatory Visit: Payer: Self-pay

## 2021-03-05 VITALS — BP 124/75 | HR 77 | Resp 20 | Ht 67.0 in | Wt 210.0 lb

## 2021-03-05 DIAGNOSIS — Z8249 Family history of ischemic heart disease and other diseases of the circulatory system: Secondary | ICD-10-CM | POA: Diagnosis not present

## 2021-03-05 DIAGNOSIS — E785 Hyperlipidemia, unspecified: Secondary | ICD-10-CM | POA: Diagnosis not present

## 2021-03-05 DIAGNOSIS — Z794 Long term (current) use of insulin: Secondary | ICD-10-CM

## 2021-03-05 DIAGNOSIS — E1122 Type 2 diabetes mellitus with diabetic chronic kidney disease: Secondary | ICD-10-CM | POA: Insufficient documentation

## 2021-03-05 DIAGNOSIS — I10 Essential (primary) hypertension: Secondary | ICD-10-CM

## 2021-03-05 DIAGNOSIS — E7800 Pure hypercholesterolemia, unspecified: Secondary | ICD-10-CM | POA: Insufficient documentation

## 2021-03-05 DIAGNOSIS — N182 Chronic kidney disease, stage 2 (mild): Secondary | ICD-10-CM

## 2021-03-05 DIAGNOSIS — I5032 Chronic diastolic (congestive) heart failure: Secondary | ICD-10-CM

## 2021-03-05 DIAGNOSIS — Z21 Asymptomatic human immunodeficiency virus [HIV] infection status: Secondary | ICD-10-CM | POA: Diagnosis not present

## 2021-03-05 DIAGNOSIS — N189 Chronic kidney disease, unspecified: Secondary | ICD-10-CM | POA: Insufficient documentation

## 2021-03-05 DIAGNOSIS — I130 Hypertensive heart and chronic kidney disease with heart failure and stage 1 through stage 4 chronic kidney disease, or unspecified chronic kidney disease: Secondary | ICD-10-CM | POA: Diagnosis not present

## 2021-03-05 DIAGNOSIS — G4733 Obstructive sleep apnea (adult) (pediatric): Secondary | ICD-10-CM | POA: Diagnosis not present

## 2021-03-05 NOTE — Patient Instructions (Signed)
Continue weighing daily and call for an overnight weight gain of > 2 pounds or a weekly weight gain of >5 pounds. 

## 2021-03-06 ENCOUNTER — Ambulatory Visit: Admission: RE | Admit: 2021-03-06 | Payer: Medicare Other | Source: Ambulatory Visit

## 2021-03-20 ENCOUNTER — Ambulatory Visit: Payer: Medicare Other

## 2021-04-02 ENCOUNTER — Other Ambulatory Visit: Payer: Self-pay

## 2021-04-02 ENCOUNTER — Ambulatory Visit
Admission: RE | Admit: 2021-04-02 | Discharge: 2021-04-02 | Disposition: A | Discharge status: Home / Self Care | Payer: Medicare Other | Source: Ambulatory Visit | Attending: Neurosurgery | Admitting: Neurosurgery

## 2021-04-02 DIAGNOSIS — M4807 Spinal stenosis, lumbosacral region: Secondary | ICD-10-CM | POA: Diagnosis present

## 2021-04-16 ENCOUNTER — Encounter: Payer: Self-pay | Admitting: Physical Therapy

## 2021-04-16 ENCOUNTER — Ambulatory Visit: Payer: Medicare Other | Attending: Neurosurgery | Admitting: Physical Therapy

## 2021-04-16 DIAGNOSIS — M25652 Stiffness of left hip, not elsewhere classified: Secondary | ICD-10-CM

## 2021-04-16 DIAGNOSIS — G8929 Other chronic pain: Secondary | ICD-10-CM

## 2021-04-16 DIAGNOSIS — M6281 Muscle weakness (generalized): Secondary | ICD-10-CM | POA: Diagnosis present

## 2021-04-16 DIAGNOSIS — M25651 Stiffness of right hip, not elsewhere classified: Secondary | ICD-10-CM | POA: Diagnosis present

## 2021-04-16 DIAGNOSIS — M5442 Lumbago with sciatica, left side: Secondary | ICD-10-CM | POA: Diagnosis not present

## 2021-04-16 DIAGNOSIS — R262 Difficulty in walking, not elsewhere classified: Secondary | ICD-10-CM

## 2021-04-16 DIAGNOSIS — M5441 Lumbago with sciatica, right side: Secondary | ICD-10-CM | POA: Diagnosis present

## 2021-04-16 NOTE — Therapy (Signed)
[facility] [address] [place], [address] Phone: [phone]   Fax:  [phone]  Physical Therapy Evaluation  Patient Details  Name: Danny Cook. MRN: [id number] Date of Birth: 1960/07/14 Referring Provider (PT): [name], [place] / [name] (neurosurgery)  Encounter Date: 04/16/2021   PT End of Session - 04/16/21 1146     Visit Number 1    Number of Visits 24    Date for PT Re-Evaluation 07/09/21    Authorization Type [employer] MEDICARE reporting period from 04/16/2021    Progress Note Due on Visit 10    PT Start Time 0950    PT Stop Time 1030    PT Time Calculation (min) 40 min    Activity Tolerance Patient tolerated treatment well;Patient limited by pain    Behavior During Therapy [facility] for tasks assessed/performed             Past Medical History:  Diagnosis Date   Anemia    Anxiety    Arthritis    CHF (congestive heart failure) ([place])    Chronic kidney disease    Renal Insufficiency Syndrome; Glomerulosclerosis 2013   Depressed    Diabetes mellitus without complication (HCC)    GERD (gastroesophageal reflux disease)    High cholesterol    History of kidney stones    HIV (human immunodeficiency virus infection) ([place])    Hypertension    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[place])    Paranoid disorder ([place])    Pneumonia    Schizophrenia, paranoid ([place])    Sleep apnea    TIA (transient ischemic attack)     Past Surgical History:  Procedure Laterality Date   CARDIAC CATHETERIZATION     COLONOSCOPY WITH PROPOFOL N/A 09/03/2015   Procedure: COLONOSCOPY WITH PROPOFOL;  Surgeon: Lollie Sails, MD;  Location: [facility] ENDOSCOPY;  Service: Endoscopy;  Laterality: N/A;   COLONOSCOPY WITH PROPOFOL N/A 09/17/2020   Procedure: COLONOSCOPY WITH PROPOFOL;  Surgeon: Lesly Rubenstein, MD;  Location: [facility] ENDOSCOPY;  Service: Endoscopy;  Laterality: N/A;  PREFERS 9AM OR LATER DUE TO TRANSPORTATION   DG  TEETH FULL     ESOPHAGOGASTRODUODENOSCOPY (EGD) WITH PROPOFOL N/A 07/01/2016   Dr. [name], [facility]. abnormal esophageal motility, suspicious for presbyesophagus, bile gastritis s/p biopsy, non-bleeding erosive gastropathy s/p biopsy, normal duodenum, path with negative H.pylori and +chronic active gastritis   ESOPHAGOGASTRODUODENOSCOPY (EGD) WITH PROPOFOL N/A 09/17/2020   Procedure: ESOPHAGOGASTRODUODENOSCOPY (EGD) WITH PROPOFOL;  Surgeon: Lesly Rubenstein, MD;  Location: [facility] ENDOSCOPY;  Service: Endoscopy;  Laterality: N/A;   EXTRACORPOREAL SHOCK WAVE LITHOTRIPSY Right 11/01/2020   Procedure: EXTRACORPOREAL SHOCK WAVE LITHOTRIPSY (ESWL);  Surgeon: Abbie Sons, MD;  Location: [facility] ORS;  Service: Urology;  Laterality: Right;    There were no vitals filed for this visit.    Subjective Assessment - 04/16/21 0956     Subjective Patient reports he is here for low back pain with radiation down both legs to lower legs but not toes. Pain in legs comes and goes but is constant in the low back. It is worse when he gets up in the morning. Reports his pain is worse when on his feet and better when sitting. Has had back pain for years, and has put off surgery multiple times.He currently feels he needs surgery and is ready for it but his surgeon told him he must have PT first.  Patient reports he has osteoarthritis that caused his pain. Denies prior  injuries as a cause. No previous spine surgeries.  Patient rarely uses a cane because he felt it was doing him no good. Has a RW that he does not use because it seemed like it was a burden on people who pick him up to go to dr appointments. States his back is getting worse over time. Would not be bent forward if he did not have back pain. Is planning to get an apartment closer to the [facility] but currently lives in [place]. Currently lives in a family care home due to his mental health conditions. Meals are prepared for him. He goes shopping  sometimes with a companion and states it is difficult due to his low back pain. Patient states he has adequate support for mental health conditions currently.    Pertinent History Patient is a 60 y.o. male who presents to outpatient physical therapy with a referral for medical diagnosis spinal stenosis of lumbar region with neurogenic claudication, lumbar degenerative disc disease. This patient's chief complaints consist of chronic constant B low back pain with intermittent B LE pain to the calves leading to the following functional deficits: difficulty getting out of bed, walking, standing activities, household and community ambulation, shopping, bathing (must sit down), walking, transfers, lifting, bending, etc.  States "it affects everything really."   Relevant past medical history and comorbidities include HIV, TIA, schizophrenic (paranoid), Kaposi's sarcoma, HTN, leep apnea, CHF, CKD, Diabetes Mellitus type 2, GERD, Anemia, Arthritis, depression, occasionally symptomatic orthostatic dizziness, cardiac cath, lymphedema (left leg), NSTEMI, Choledocholithiasis (see chart for full PMH).    Patient denies hx of seizures, lung problem (but takes medication for pulmonary dysfunction), unexplained weight loss, changes in bowel or bladder problems, new onset stumbling or dropping things worsening with back pain, spinal surgery.    Limitations Lifting;Standing;Walking;House hold activities    How long can you stand comfortably? zero to a few seconds    Diagnostic tests Lumbar MRI report 04/02/2021: "IMPRESSION:  1. Mild progression of severe multifactorial spinal stenosis at  L3-4.  2. Unchanged moderate spinal stenosis and moderate to severe neural  foraminal stenosis at L4-5.  3. Unchanged mild spinal stenosis and moderate lateral recess  stenosis at L1-2."    Patient Stated Goals "see if he is able to walk again and do exercises at home" be able to stand up straight and not have pain; be able to do the things he  used to    Currently in Pain? Yes    Pain Score 5    W: 10/10; B: 4/10   Pain Location Back    Pain Orientation Lower;Right;Left    Pain Descriptors / Indicators Aching;Dull;Sharp    Pain Type Chronic pain    Pain Radiating Towards bilateral LE to lower legs, intermittant; does have numbness and tingling in the feet (neuropathy).    Pain Onset More than a month ago    Pain Frequency Constant    Aggravating Factors  Laying on back, first gets up in the morning, walking, going up ramp, stairs, work around house.    Pain Relieving Factors sitting, leaning forward on something while standing    Effect of Pain on Daily Activities Functional Limitations: getting out of bed, walking, standing activities, household and community ambulation, shopping, bathing (must sit down), walking, transfers, lifting, bending, etc.  States "it affects everything really."              [facility] PT Assessment - 04/16/21 0001       Assessment  Medical Diagnosis spinal stenosis of lumbar region with neurogenic claudication, lumbar degenerative disc disease    Referring Provider (PT) [name], [place] / [name] (neurosurgery)    Onset Date/Surgical Date --   years   Hand Dominance Right    Next MD Visit 05/30/2021    Prior Therapy yes with no improvement several years ago      Precautions   Precautions Fall      Restrictions   Weight Bearing Restrictions No      Balance Screen   Has the patient fallen in the past 6 months No    Has the patient had a decrease in activity level because of a fear of falling?  No    Is the patient reluctant to leave their home because of a fear of falling?  No      Home Environment   Living Environment --   no concerns about getting around living environment safely     Prior Function   Level of Independence Independent    Vocation On disability   mental health   Vocation Requirements used to do a lot of jobs, Architect, Tourist information centre manager, fast food    Leisure play  bingo, walking, running      Cognition   Overall Cognitive Status Within Functional Limits for tasks assessed             [place] score: 37/100 (lumbar spine questionnaire)  OBSERVATION/INSPECTION Posture Posture (seated): forward head, rounded shoulders, slumped in sitting.  Posture (standing): forward flexed at waist and along entire spine except upper cervical spine. unable to stand up straight. Stands in about 24 degrees forward lumbo/pelvic/hip flexion.  Anthropometrics Tremor: with effort during assessment Body composition: obese Functional Mobility Bed mobility: supine <> sit and rolling mod I with need for increased time, pain Transfers: sit <> stand mod I for increased time, pain, uses B UE.  Gait: ambulates with very stooped posture, short step length, slow pace, looks for a place to sit.   SPINE MOTION Lumbar Spine AROM *Indicates pain Flexion: fingers to shoe laces with knees flexed, increased low back and leg pain Extension: almost neutral, pain in back and down legs Side Flexion:   R 25% back and B leg pain  L 25% back and B leg pain Rotation:  R 25% back pain L 25% back pain Difficulty remaining standing for entire standing exam.   NEUROLOGICAL Upper Motor Neuron Screen Hoffman's and Clonus (ankle) negative bilaterally.  Dermatomes L2-S2 appears equal and intact to light touch  PERIPHERAL JOINT MOTION (in degrees) Passive Range of Motion (PROM) B LE WFL for basic  mobility except B hip extension ~ 0 degrees, minimal to no hip IR at 90. End range pain in low back for hip flexion, ER, and IR bilaterally.   MUSCLE PERFORMANCE (MMT):  *Indicates pain 04/16/21 Date Date  Joint/Motion R/L R/L R/L  Hip     Flexion (L1, L2) 4+/4 / /  Abduction 3/2* / /  Knee     Extension (L3) 5/5 / /  Flexion (S2) 4/4 / /  Ankle/Foot     Dorsiflexion (L4) 4/4+ / /  Great toe extension (L5) 4/4 / /  Eversion (S1) 4+/5 / /   Plantarflexion (S1) 4/4 / /  Comments: 04/16/2021: difficulty toe walking (heels do not stay up and barely clear floor before single leg stance), unable to heel walk effectively; (with BUE support).   SPECIAL TESTS:  LOWER LIMB  NEURODYNAMICS Slump Test (entire nervous system)  R = negative  L = negative Straight Leg Raise (Sciatic nerve)  R  = positive posterior thigh pain  L  = positive posterior thigh pain  HIP SPECIAL TESTS FABER: R = positive for back pain, L = positive for back pain. FADDIR: R = negative, L = negative  PALPATION: Cursory exam at low back only in sideling: Feels good to palpate lumbar paraspinals. Further exam to be performed in future as appropriate.   Objective measurements completed on examination: See above findings.    TREATMENT:   Therapeutic exercise: to centralize symptoms and improve ROM, strength, muscular endurance, and activity tolerance required for successful completion of functional activities.  - hookling low trunk rotation 1x5 each side, discontinued due to increased pain in back.  - Education on diagnosis, prognosis, POC, anatomy and physiology of current condition.   Patient reported feeling no better or worse after exam.     PT Education - 04/16/21 1146     Education Details Education on diagnosis, prognosis, POC, anatomy and physiology of current condition    Person(s) Educated Patient    Methods Explanation    Comprehension Verbalized understanding;Need further instruction              PT Short Term Goals - 04/16/21 1156       PT SHORT TERM GOAL #1   Title Be independent with initial home exercise program for self-management of symptoms.    Baseline initial HEP to be established visit 2 as appropriate (04/16/2021);    Time 2    Period Weeks    Status New    Target Date 04/30/21               PT Long Term Goals - 04/16/21 1157       PT LONG TERM GOAL #1   Title Be independent with a long-term home exercise  program for self-management of symptoms.    Baseline initial HEP to be established visit 2 as appropriate (04/16/2021);    Time 12    Period Weeks    Status New   TARGET DATE FOR ALL LONG TERM GOALS: 07/09/2021     PT LONG TERM GOAL #2   Title Demonstrate improved FOTO to equal or greater than 46 by visit #12 to demonstrate improvement in overall condition and self-reported functional ability.    Baseline 37 (04/16/2021);    Time 12    Period Weeks    Status New      PT LONG TERM GOAL #3   Title Patient will improve B hip extension to equal or greater than 20 degrees to improve upright standing posture and decrease leg and back pain during upright activities to improve functional mobility.    Baseline ~ 0 degrees bilaterally (04/16/2021);    Time 12    Period Weeks    Status New      PT LONG TERM GOAL #4   Title Patient will complete 5 Time Sit <> Stand Test in equal or less than 12 seconds with no UE support from 18.5 inch or less surface height to improve functional mobilty for transfers and ambulatoin.    Baseline not tested (04/16/2021);    Time 12    Period Weeks    Status New      PT LONG TERM GOAL #5   Title Patient will ambulate equal or greater than 1000 feet with LRAD and mod I during 6 Minute Walk Test to improve  household and community mobility.    Baseline Not formally tested; looks for place to sit as soon as he arrives at location of 50-100 feet with no AD (04/16/2021);    Time 12    Period Weeks                    Plan - 04/16/21 1208     Clinical Impression Statement Patient is a 60 y.o. male referred to outpatient physical therapy with a medical diagnosis of spinal stenosis of lumbar region with neurogenic claudication, lumbar degenerative disc disease who presents with signs and symptoms consistent with chronic bilateral low back pain with radiation to B LE with presentation matching symptomatic severe lumbar stenosis with neurogenic claudication.  Patient has significant joint stiffness in the lumbar spine and hips that prevents him from standing up straight. LE neurodynamic tests are negative in position of lumbar flexion but positive in position of lumbar extension. Patient has limitations in hip extension and IR but negative for special tests for intra-articular joint pain. Patient presents with significant ROM, joint stiffness, muscle tension, pain, postural dysfunction, balance, muscle performance (strength/power/endurance), standing tolerance, and activity tolerance  impairments that are limiting ability to complete "everything really" including getting out of bed, walking, standing activities, household and community ambulation, shopping, bathing (must sit down), walking, transfers, lifting, bending, etc without difficulty. Noted in clinic to have greatest difficulty with standing and walking activities. Patient will benefit from skilled physical therapy intervention to address current body structure impairments and activity limitations to improve function and work towards goals set in current POC in order to return to prior level of function or maximal functional improvement.    Personal Factors and Comorbidities Age;Comorbidity 3+;Past/Current Experience;Fitness;Time since onset of injury/illness/exacerbation    Comorbidities Relevant past medical history and comorbidities include HIV, TIA, schizophrenic (paranoid), Kaposi's sarcoma, HTN, leep apnea, CHF, CKD, Diabetes Mellitus type 2, GERD, Anemia, Arthritis, depression, occasionally symptomatic orthostatic dizziness, cardiac cath, lymphedema (left leg), NSTEMI, Choledocholithiasis (see chart for full PMH).    Examination-Activity Limitations Bathing;Bed Mobility;Lift;Stairs;Squat;Bend;Stand;Locomotion Level;Reach Overhead;Carry;Transfers    Examination-Participation Restrictions Shop;Community Activity;Interpersonal Relationship   getting out of bed, walking, standing activities, household and  community ambulation, shopping, bathing (must sit down), walking, transfers, lifting, bending, etc.  States "it affects everything really."   Stability/Clinical Decision Making Stable/Uncomplicated    Clinical Decision Making Low    Rehab Potential Fair    PT Frequency 2x / week    PT Duration 12 weeks    PT Treatment/Interventions ADLs/Self Care Home Management;Aquatic Therapy;Cryotherapy;Moist Heat;Traction;Electrical Stimulation;DME Instruction;Gait training;Functional mobility training;Balance training;Therapeutic exercise;Therapeutic activities;Neuromuscular re-education;Patient/family education;Manual techniques;Dry needling;Passive range of motion;Joint Manipulations;Spinal Manipulations;Energy conservation    PT Next Visit Plan test 5TSTS, 6MWT; establish initial HEP, interventions to improve hip extension, improve lumbar mobility, LE and postural strengthening as tolerated    PT Home Exercise Plan TBD    Consulted and Agree with Plan of Care Patient             Patient will benefit from skilled therapeutic intervention in order to improve the following deficits and impairments:  Abnormal gait, Decreased knowledge of use of DME, Impaired sensation, Improper body mechanics, Pain, Decreased mobility, Increased muscle spasms, Postural dysfunction, Decreased activity tolerance, Decreased endurance, Decreased range of motion, Decreased strength, Hypomobility, Impaired perceived functional ability, Decreased balance, Difficulty walking, Impaired flexibility, Obesity  Visit Diagnosis: Chronic bilateral low back pain with bilateral sciatica  Stiffness of right hip, not elsewhere classified  Stiffness of left hip,  not elsewhere classified  Difficulty in walking, not elsewhere classified  Muscle weakness (generalized)     Problem List Patient Active Problem List   Diagnosis Date Noted   Abdominal pain 07/30/2020   Choledocholithiasis 07/30/2020   Hypoglycemia 07/30/2020    Dehydration 07/30/2020   Hyperlipidemia 07/30/2020   Acute renal failure superimposed on stage 3a chronic kidney disease ([place]) 07/30/2020   Acute cholecystitis 07/29/2020   Sepsis ([place]) 08/15/2019   NSTEMI (non-ST elevated myocardial infarction) ([place]) 06/07/2018   Lymphedema 05/12/2018   Pleural effusion    FUO (fever of unknown origin)    MGUS (monoclonal gammopathy of unknown significance)    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 Graylon Good, PT, DPT 04/16/21, 12:12 PM   [facility] [address], [place], [address] Phone: [phone]   Fax:  [phone]  Name: Danny Cook. MRN: [id number] Date of Birth: 1961-04-16

## 2021-04-22 ENCOUNTER — Ambulatory Visit: Payer: Medicare Other | Admitting: Physical Therapy

## 2021-04-22 ENCOUNTER — Encounter: Payer: Self-pay | Admitting: Physical Therapy

## 2021-04-22 DIAGNOSIS — M5442 Lumbago with sciatica, left side: Secondary | ICD-10-CM | POA: Diagnosis not present

## 2021-04-22 DIAGNOSIS — M25652 Stiffness of left hip, not elsewhere classified: Secondary | ICD-10-CM

## 2021-04-22 DIAGNOSIS — M6281 Muscle weakness (generalized): Secondary | ICD-10-CM

## 2021-04-22 DIAGNOSIS — M25651 Stiffness of right hip, not elsewhere classified: Secondary | ICD-10-CM

## 2021-04-22 DIAGNOSIS — R262 Difficulty in walking, not elsewhere classified: Secondary | ICD-10-CM

## 2021-04-22 DIAGNOSIS — G8929 Other chronic pain: Secondary | ICD-10-CM

## 2021-04-22 NOTE — Therapy (Signed)
[facility] [address], [place], [address] Phone: [phone]   Fax:  [phone]  Physical Therapy Treatment  Patient Details  Name: Danny Cook. MRN: [id number] Date of Birth: [birth year] Referring Provider (PT): [name], [place] / [name] (neurosurgery)   Encounter Date: 04/22/2021   PT End of Session - 04/22/21 1635     Visit Number 2    Number of Visits 24    Date for PT Re-Evaluation 07/09/21    Authorization Type [employer] MEDICARE reporting period from 04/16/2021    Progress Note Due on Visit 10    PT Start Time 1607    PT Stop Time 7353    PT Time Calculation (min) 38 min    Activity Tolerance Patient tolerated treatment well;Patient limited by pain    Behavior During Therapy [facility] for tasks assessed/performed             Past Medical History:  Diagnosis Date   Anemia    Anxiety    Arthritis    CHF (congestive heart failure) ([place])    Chronic kidney disease    Renal Insufficiency Syndrome; Glomerulosclerosis 2013   Depressed    Diabetes mellitus without complication (HCC)    GERD (gastroesophageal reflux disease)    High cholesterol    History of kidney stones    HIV (human immunodeficiency virus infection) ([place])    Hypertension    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[place])    Paranoid disorder ([place])    Pneumonia    Schizophrenia, paranoid ([place])    Sleep apnea    TIA (transient ischemic attack)     Past Surgical History:  Procedure Laterality Date   CARDIAC CATHETERIZATION     COLONOSCOPY WITH PROPOFOL N/A 09/03/2015   Procedure: COLONOSCOPY WITH PROPOFOL;  Surgeon: Lollie Sails, MD;  Location: [facility] ENDOSCOPY;  Service: Endoscopy;  Laterality: N/A;   COLONOSCOPY WITH PROPOFOL N/A 09/17/2020   Procedure: COLONOSCOPY WITH PROPOFOL;  Surgeon: Lesly Rubenstein, MD;  Location: [facility] ENDOSCOPY;  Service: Endoscopy;  Laterality: N/A;  PREFERS 9AM OR LATER DUE TO TRANSPORTATION    DG TEETH FULL     ESOPHAGOGASTRODUODENOSCOPY (EGD) WITH PROPOFOL N/A 07/01/2016   Dr. [name], [facility]. abnormal esophageal motility, suspicious for presbyesophagus, bile gastritis s/p biopsy, non-bleeding erosive gastropathy s/p biopsy, normal duodenum, path with negative H.pylori and +chronic active gastritis   ESOPHAGOGASTRODUODENOSCOPY (EGD) WITH PROPOFOL N/A 09/17/2020   Procedure: ESOPHAGOGASTRODUODENOSCOPY (EGD) WITH PROPOFOL;  Surgeon: Lesly Rubenstein, MD;  Location: [facility] ENDOSCOPY;  Service: Endoscopy;  Laterality: N/A;   EXTRACORPOREAL SHOCK WAVE LITHOTRIPSY Right 11/01/2020   Procedure: EXTRACORPOREAL SHOCK WAVE LITHOTRIPSY (ESWL);  Surgeon: Abbie Sons, MD;  Location: [facility] ORS;  Service: Urology;  Laterality: Right;    There were no vitals filed for this visit.   Subjective Assessment - 04/22/21 1610     Subjective Patient reports he feels like his blood glucose is low and brought his glucose tablets. He is eating 5 tablets based on how he feels at the start of the session. He states he missed lunch because he was feeling sick to his stomach.  He is having cataract surgery this thursday. He reports no pain currently but continues to have pain when up walking. Felt okay after last PT session. Brought no AD device.    Pertinent History Patient is a 60 y.o. male who presents to outpatient physical therapy with a referral for medical diagnosis spinal stenosis of  lumbar region with neurogenic claudication, lumbar degenerative disc disease. This patient's chief complaints consist of chronic constant B low back pain with intermittent B LE pain to the calves leading to the following functional deficits: difficulty getting out of bed, walking, standing activities, household and community ambulation, shopping, bathing (must sit down), walking, transfers, lifting, bending, etc.  States "it affects everything really."   Relevant past medical history and comorbidities include HIV, TIA,  schizophrenic (paranoid), Kaposi's sarcoma, HTN, leep apnea, CHF, CKD, Diabetes Mellitus type 2, GERD, Anemia, Arthritis, depression, occasionally symptomatic orthostatic dizziness, cardiac cath, lymphedema (left leg), NSTEMI, Choledocholithiasis (see chart for full PMH).    Patient denies hx of seizures, lung problem (but takes medication for pulmonary dysfunction), unexplained weight loss, changes in bowel or bladder problems, new onset stumbling or dropping things worsening with back pain, spinal surgery.    Limitations Lifting;Standing;Walking;House hold activities    How long can you stand comfortably? zero to a few seconds    Diagnostic tests Lumbar MRI report 04/02/2021: "IMPRESSION:  1. Mild progression of severe multifactorial spinal stenosis at  L3-4.  2. Unchanged moderate spinal stenosis and moderate to severe neural  foraminal stenosis at L4-5.  3. Unchanged mild spinal stenosis and moderate lateral recess  stenosis at L1-2."    Patient Stated Goals "see if he is able to walk again and do exercises at home" be able to stand up straight and not have pain; be able to do the things he used to    Currently in Pain? No/denies    Pain Onset More than a month ago            OBJECTIVE  5 Times Sit To Stand: 15.41 seconds from 18.5 inch plinth with no UE support (reports out of breath).   6 Minute Walk Test: 200 feet in less than 2 min. (No pain but legs felt like they would give out and he needed to sit. Extremely stooped posture with face facing ground). SpO2 95% and HR up to 100 bpm by end of attempt. States neck hurts. States he could not stand up straight due to back pain.    TREATMENT:    Therapeutic exercise: to centralize symptoms and improve ROM, strength, muscular endurance, and activity tolerance required for successful completion of functional activities.  - sit <> stand 1x5 for time to assess baseline 5TSTS test (see above) - ambulation 200 feet with no AD while attempting to  walk as far as possible in 6 min to assess baseline 6MWT. Legs began to feel weak and pt needed to sit (started staggering). SpO2 95% and HR up to 100 bpm by end of attempt.  - rest to eat crackers after patient states he feels his blood glucose still dropping.  - NuStep level 2 using bilateral lower extremities only. Seat setting 9. For improved extremity mobility, muscular endurance, and activity tolerance; and to induce the analgesic effect of aerobic exercise, stimulate improved joint nutrition, and prepare body structures and systems for following interventions. x 6  minutes. Pain in right low back increasing with time. Pain down R LE when he sat up straight, slightly improved when he severely flexes cervicothoracic spine and by end of bout. Average SPM = 37  - sit <> stand from 17 inch chair, 2x10 concentrating on standing up tall, no UE support. Pain at back of thighs by last set.  - seated B Shoulder ER with yellow theraband and back unsupported, 2x10 (reports back pain that radiates to right  posterior thigh during exercise, resolves with rest).  - Education on HEP including handout   Pt required multimodal cuing for proper technique and to facilitate improved neuromuscular control, strength, range of motion, and functional ability resulting in improved performance and form.  HOME EXERCISE PROGRAM Access Code: [email or web address] URL: [email or web address] Date: 04/22/2021 Prepared by: Rosita Kea  Exercises Sit to Stand Without Arm Support - 1 x daily - 3 sets - 10 reps Supine Lower Trunk Rotation - 1 x daily - 1 sets - 20 reps   PT Education - 04/22/21 1635     Education Details exercise/intervention purpose/form.    Person(s) Educated Patient    Methods Explanation;Demonstration;Tactile cues;Verbal cues    Comprehension Verbalized understanding;Returned demonstration;Verbal cues required;Tactile cues required;Need further instruction              PT Short Term Goals -  04/22/21 1828       PT SHORT TERM GOAL #1   Title Be independent with initial home exercise program for self-management of symptoms.    Baseline initial HEP to be established visit 2 as appropriate (04/16/2021); HEP provided visit 2 (04/22/2021);    Time 2    Period Weeks    Status On-going    Target Date 04/30/21               PT Long Term Goals - 04/16/21 1157       PT LONG TERM GOAL #1   Title Be independent with a long-term home exercise program for self-management of symptoms.    Baseline initial HEP to be established visit 2 as appropriate (04/16/2021);    Time 12    Period Weeks    Status New   TARGET DATE FOR ALL LONG TERM GOALS: 07/09/2021     PT LONG TERM GOAL #2   Title Demonstrate improved FOTO to equal or greater than 46 by visit #12 to demonstrate improvement in overall condition and self-reported functional ability.    Baseline 37 (04/16/2021);    Time 12    Period Weeks    Status New      PT LONG TERM GOAL #3   Title Patient will improve B hip extension to equal or greater than 20 degrees to improve upright standing posture and decrease leg and back pain during upright activities to improve functional mobility.    Baseline ~ 0 degrees bilaterally (04/16/2021);    Time 12    Period Weeks    Status New      PT LONG TERM GOAL #4   Title Patient will complete 5 Time Sit <> Stand Test in equal or less than 12 seconds with no UE support from 18.5 inch or less surface height to improve functional mobilty for transfers and ambulatoin.    Baseline not tested (04/16/2021);    Time 12    Period Weeks    Status New      PT LONG TERM GOAL #5   Title Patient will ambulate equal or greater than 1000 feet with LRAD and mod I during 6 Minute Walk Test to improve household and community mobility.    Baseline Not formally tested; looks for place to sit as soon as he arrives at location of 50-100 feet with no AD (04/16/2021);    Time 12    Period Weeks                    Plan - 04/22/21 1834     Clinical Impression Statement Patient  tolerated PT with some difficulty due to feelings of low blood glucose and difficulty maintaining or achieving erect posture due to stiffness and pain with attempts to straighten. Pain symptoms did appear to decrease with seated rest in slumped position, but this is not a very functional posture for patient's need for functional independence. Patient also complained of quick LE fatigue and was only able to ambulate 200 feet with 6MWT in under 2 min due to weakness in B LE causing stumbling. Patient continues to have significant limitations in functional mobility. His report of low blood glucose symptoms improved after eating some crackers and glucose tablets. Patient would benefit from continued management of limiting condition by skilled physical therapist to address remaining impairments and functional limitations to work towards stated goals and return to PLOF or maximal functional independence.    Personal Factors and Comorbidities Age;Comorbidity 3+;Past/Current Experience;Fitness;Time since onset of injury/illness/exacerbation    Comorbidities Relevant past medical history and comorbidities include HIV, TIA, schizophrenic (paranoid), Kaposi's sarcoma, HTN, leep apnea, CHF, CKD, Diabetes Mellitus type 2, GERD, Anemia, Arthritis, depression, occasionally symptomatic orthostatic dizziness, cardiac cath, lymphedema (left leg), NSTEMI, Choledocholithiasis (see chart for full PMH).    Examination-Activity Limitations Bathing;Bed Mobility;Lift;Stairs;Squat;Bend;Stand;Locomotion Level;Reach Overhead;Carry;Transfers    Examination-Participation Restrictions Shop;Community Activity;Interpersonal Relationship   getting out of bed, walking, standing activities, household and community ambulation, shopping, bathing (must sit down), walking, transfers, lifting, bending, etc.  States "it affects everything really."   Stability/Clinical  Decision Making Stable/Uncomplicated    Rehab Potential Fair    PT Frequency 2x / week    PT Duration 12 weeks    PT Treatment/Interventions ADLs/Self Care Home Management;Aquatic Therapy;Cryotherapy;Moist Heat;Traction;Electrical Stimulation;DME Instruction;Gait training;Functional mobility training;Balance training;Therapeutic exercise;Therapeutic activities;Neuromuscular re-education;Patient/family education;Manual techniques;Dry needling;Passive range of motion;Joint Manipulations;Spinal Manipulations;Energy conservation    PT Next Visit Plan test 5TSTS, 6MWT; establish initial HEP, interventions to improve hip extension, improve lumbar mobility, LE and postural strengthening as tolerated    PT Home Exercise Plan Medbridge Access Code: [email or web address]    Consulted and Agree with Plan of Care Patient             Patient will benefit from skilled therapeutic intervention in order to improve the following deficits and impairments:  Abnormal gait, Decreased knowledge of use of DME, Impaired sensation, Improper body mechanics, Pain, Decreased mobility, Increased muscle spasms, Postural dysfunction, Decreased activity tolerance, Decreased endurance, Decreased range of motion, Decreased strength, Hypomobility, Impaired perceived functional ability, Decreased balance, Difficulty walking, Impaired flexibility, Obesity  Visit Diagnosis: Chronic bilateral low back pain with bilateral sciatica  Stiffness of right hip, not elsewhere classified  Stiffness of left hip, not elsewhere classified  Difficulty in walking, not elsewhere classified  Muscle weakness (generalized)     Problem List Patient Active Problem List   Diagnosis Date Noted   Abdominal pain 07/30/2020   Choledocholithiasis 07/30/2020   Hypoglycemia 07/30/2020   Dehydration 07/30/2020   Hyperlipidemia 07/30/2020   Acute renal failure superimposed on stage 3a chronic kidney disease ([place]) 07/30/2020   Acute cholecystitis 07/29/2020    Sepsis ([place]) 08/15/2019   NSTEMI (non-ST elevated myocardial infarction) ([place]) 06/07/2018   Lymphedema 05/12/2018   Pleural effusion    FUO (fever of unknown origin)    MGUS (monoclonal gammopathy of unknown significance)    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 Graylon Good, PT, DPT 04/22/21, 6:36 PM   [facility] [address], [place], [address] Phone: [phone]   Fax:  [phone]  Name: Danny Cook. MRN: [id number] Date of Birth: [birth year]

## 2021-04-24 ENCOUNTER — Encounter: Payer: Self-pay | Admitting: Physical Therapy

## 2021-04-24 ENCOUNTER — Ambulatory Visit: Payer: Medicare Other | Admitting: Physical Therapy

## 2021-04-24 DIAGNOSIS — M5442 Lumbago with sciatica, left side: Secondary | ICD-10-CM | POA: Diagnosis not present

## 2021-04-24 DIAGNOSIS — M6281 Muscle weakness (generalized): Secondary | ICD-10-CM

## 2021-04-24 DIAGNOSIS — M25652 Stiffness of left hip, not elsewhere classified: Secondary | ICD-10-CM

## 2021-04-24 DIAGNOSIS — R262 Difficulty in walking, not elsewhere classified: Secondary | ICD-10-CM

## 2021-04-24 DIAGNOSIS — G8929 Other chronic pain: Secondary | ICD-10-CM

## 2021-04-24 DIAGNOSIS — M25651 Stiffness of right hip, not elsewhere classified: Secondary | ICD-10-CM

## 2021-04-24 NOTE — Therapy (Signed)
[facility] [address], [place], [address] Phone: [phone]   Fax:  [phone]  Physical Therapy Treatment  Patient Details  Name: Danny Cook. MRN: [id number] Date of Birth: 1960/11/25 Referring Provider (PT): [name], [place] / [name] (neurosurgery)   Encounter Date: 04/24/2021   PT End of Session - 04/24/21 1540     Visit Number 3    Number of Visits 24    Date for PT Re-Evaluation 07/09/21    Authorization Type [employer] MEDICARE reporting period from 04/16/2021    Progress Note Due on Visit 10    PT Start Time 1520    PT Stop Time 1600    PT Time Calculation (min) 40 min    Activity Tolerance Patient tolerated treatment well;Patient limited by pain    Behavior During Therapy 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)    Pneumonia    Schizophrenia, paranoid ([place])    Sleep apnea    TIA (transient ischemic attack)     Past Surgical History:  Procedure Laterality Date   CARDIAC CATHETERIZATION     COLONOSCOPY WITH PROPOFOL N/A 09/03/2015   Procedure: COLONOSCOPY WITH PROPOFOL;  Surgeon: Lollie Sails, MD;  Location: [facility] ENDOSCOPY;  Service: Endoscopy;  Laterality: N/A;   COLONOSCOPY WITH PROPOFOL N/A 09/17/2020   Procedure: COLONOSCOPY WITH PROPOFOL;  Surgeon: Lesly Rubenstein, MD;  Location: [facility] ENDOSCOPY;  Service: Endoscopy;  Laterality: N/A;  PREFERS 9AM OR LATER DUE TO TRANSPORTATION    DG TEETH FULL     ESOPHAGOGASTRODUODENOSCOPY (EGD) WITH PROPOFOL N/A 07/01/2016   Dr. [name], [facility]. abnormal esophageal motility, suspicious for presbyesophagus, bile gastritis s/p biopsy, non-bleeding erosive gastropathy s/p biopsy, normal duodenum, path with negative H.pylori and +chronic active gastritis   ESOPHAGOGASTRODUODENOSCOPY (EGD) WITH PROPOFOL N/A 09/17/2020   Procedure: ESOPHAGOGASTRODUODENOSCOPY (EGD) WITH PROPOFOL;  Surgeon: Lesly Rubenstein, MD;  Location: [facility] ENDOSCOPY;  Service: Endoscopy;  Laterality: N/A;   EXTRACORPOREAL SHOCK WAVE LITHOTRIPSY Right 11/01/2020   Procedure: EXTRACORPOREAL SHOCK WAVE LITHOTRIPSY (ESWL);  Surgeon: Abbie Sons, MD;  Location: [facility] ORS;  Service: Urology;  Laterality: Right;    There were no vitals filed for this visit.   Subjective Assessment - 04/24/21 1524     Subjective Patient reports his back pain is 9/10 today R > L. States he was sore after last PT session. States the low trunk rotation exercise is fine but the sit <> stand "just about kills me" and his pain does not go back down until he lays down at night.    Pertinent History Patient is a 60 y.o. male who presents to outpatient physical therapy with a referral for medical diagnosis spinal stenosis of lumbar region with neurogenic claudication, lumbar degenerative disc disease. This patient's chief complaints consist of chronic constant B low back pain with intermittent B LE  pain to the calves leading to the following functional deficits: difficulty getting out of bed, walking, standing activities, household and community ambulation, shopping, bathing (must sit down), walking, transfers, lifting, bending, etc.  States "it affects everything really."   Relevant past medical history and comorbidities include HIV, TIA, schizophrenic (paranoid), Kaposi's sarcoma, HTN, leep apnea, CHF, CKD, Diabetes Mellitus type 2, GERD, Anemia, Arthritis, depression, occasionally symptomatic  orthostatic dizziness, cardiac cath, lymphedema (left leg), NSTEMI, Choledocholithiasis (see chart for full PMH).    Patient denies hx of seizures, lung problem (but takes medication for pulmonary dysfunction), unexplained weight loss, changes in bowel or bladder problems, new onset stumbling or dropping things worsening with back pain, spinal surgery.    Limitations Lifting;Standing;Walking;House hold activities    How long can you stand comfortably? zero to a few seconds    Diagnostic tests Lumbar MRI report 04/02/2021: "IMPRESSION:  1. Mild progression of severe multifactorial spinal stenosis at  L3-4.  2. Unchanged moderate spinal stenosis and moderate to severe neural  foraminal stenosis at L4-5.  3. Unchanged mild spinal stenosis and moderate lateral recess  stenosis at L1-2."    Patient Stated Goals "see if he is able to walk again and do exercises at home" be able to stand up straight and not have pain; be able to do the things he used to    Currently in Pain? Yes    Pain Score 9     Pain Onset More than a month ago               TREATMENT:   Denies sensitivity to latex  Therapeutic exercise: to centralize symptoms and improve ROM, strength, muscular endurance, and activity tolerance required for successful completion of functional activities.  - NuStep level 2 using bilateral lower and upper extremities. Seat/handle setting 9. For improved extremity mobility, muscular endurance, and activity tolerance; and to induce the analgesic effect of aerobic exercise, stimulate improved joint nutrition, and prepare body structures and systems for following interventions. x 5  minutes. Pain in right low back slightly increasing with time. Average SPM = 58 - TRX sit <> stand from 17 inch chair, 2x10 concentrating on standing up tall. - hooklying LTR, 1x20 each side - hooklying LTR with theraball under legs, 1x20 each direction.  - hooklying double knees to chest with green theraball under feet,  1x20.  - hooklying B hip abduction against green theraband around knees, 5 second hold, 1x20 (pain in R low back) - hooklying abdominal brace with marching, 2x10 each side. (Out of breath) - hooklying bridge, 1x1 but discontinued due to pain and difficulty lifting buttocks.  - quadruped cat-cow, 1x10, improved form with cuing to move belly button towards floor and ceiling.  - quadruped rock back to child's pose attempted but stopped due to knee pain.  - quadruped bird dog with LE only, 1x5 each side (very difficult, caused lightheadedness, cleared with rest).  - Education on HEP including handout    Pt required multimodal cuing for proper technique and to facilitate improved neuromuscular control, strength, range of motion, and functional ability resulting in improved performance and form.   HOME EXERCISE PROGRAM Access Code: [email or web address] URL: [email or web address] Date: 04/22/2021 Prepared by: Rosita Kea   Exercises Sit to Stand Without Arm Support - 1 x daily - 3 sets - 10 reps Supine Lower Trunk Rotation - 1 x daily - 1 sets - 20 reps      PT Education - 04/24/21 1540     Education  Details exercise/intervention purpose/form    Person(s) Educated Patient    Methods Explanation;Demonstration;Tactile cues;Verbal cues    Comprehension Verbalized understanding;Returned demonstration;Verbal cues required;Tactile cues required;Need further instruction              PT Short Term Goals - 04/22/21 1828       PT SHORT TERM GOAL #1   Title Be independent with initial home exercise program for self-management of symptoms.    Baseline initial HEP to be established visit 2 as appropriate (04/16/2021); HEP provided visit 2 (04/22/2021);    Time 2    Period Weeks    Status On-going    Target Date 04/30/21               PT Long Term Goals - 04/16/21 1157       PT LONG TERM GOAL #1   Title Be independent with a long-term home exercise program for self-management  of symptoms.    Baseline initial HEP to be established visit 2 as appropriate (04/16/2021);    Time 12    Period Weeks    Status New   TARGET DATE FOR ALL LONG TERM GOALS: 07/09/2021     PT LONG TERM GOAL #2   Title Demonstrate improved FOTO to equal or greater than 46 by visit #12 to demonstrate improvement in overall condition and self-reported functional ability.    Baseline 37 (04/16/2021);    Time 12    Period Weeks    Status New      PT LONG TERM GOAL #3   Title Patient will improve B hip extension to equal or greater than 20 degrees to improve upright standing posture and decrease leg and back pain during upright activities to improve functional mobility.    Baseline ~ 0 degrees bilaterally (04/16/2021);    Time 12    Period Weeks    Status New      PT LONG TERM GOAL #4   Title Patient will complete 5 Time Sit <> Stand Test in equal or less than 12 seconds with no UE support from 18.5 inch or less surface height to improve functional mobilty for transfers and ambulatoin.    Baseline not tested (04/16/2021);    Time 12    Period Weeks    Status New      PT LONG TERM GOAL #5   Title Patient will ambulate equal or greater than 1000 feet with LRAD and mod I during 6 Minute Walk Test to improve household and community mobility.    Baseline Not formally tested; looks for place to sit as soon as he arrives at location of 50-100 feet with no AD (04/16/2021);    Time 12    Period Weeks                   Plan - 04/24/21 1538     Clinical Impression Statement Patient tolerated treatment with some difficulty due to back and leg pain, R > L. Interventions focused on exercises to strengthen posture and the posterior chain as well as improved spinal mobility and core strength to decrease pain. Pateint to have cateract surgeries tomorrow and plan to get medical clearance prior to return to PT. Patient would benefit from continued management of limiting condition by skilled  physical therapist to address remaining impairments and functional limitations to work towards stated goals and return to PLOF or maximal functional independence.    Personal Factors and Comorbidities Age;Comorbidity 3+;Past/Current Experience;Fitness;Time since onset of injury/illness/exacerbation  Comorbidities Relevant past medical history and comorbidities include HIV, TIA, schizophrenic (paranoid), Kaposi's sarcoma, HTN, leep apnea, CHF, CKD, Diabetes Mellitus type 2, GERD, Anemia, Arthritis, depression, occasionally symptomatic orthostatic dizziness, cardiac cath, lymphedema (left leg), NSTEMI, Choledocholithiasis (see chart for full PMH).    Examination-Activity Limitations Bathing;Bed Mobility;Lift;Stairs;Squat;Bend;Stand;Locomotion Level;Reach Overhead;Carry;Transfers    Examination-Participation Restrictions Shop;Community Activity;Interpersonal Relationship   getting out of bed, walking, standing activities, household and community ambulation, shopping, bathing (must sit down), walking, transfers, lifting, bending, etc.  States "it affects everything really."   Stability/Clinical Decision Making Stable/Uncomplicated    Rehab Potential Fair    PT Frequency 2x / week    PT Duration 12 weeks    PT Treatment/Interventions ADLs/Self Care Home Management;Aquatic Therapy;Cryotherapy;Moist Heat;Traction;Electrical Stimulation;DME Instruction;Gait training;Functional mobility training;Balance training;Therapeutic exercise;Therapeutic activities;Neuromuscular re-education;Patient/family education;Manual techniques;Dry needling;Passive range of motion;Joint Manipulations;Spinal Manipulations;Energy conservation    PT Next Visit Plan interventions to improve hip extension, improve lumbar mobility, LE, core, functional, and postural strengthening as tolerated    PT Home Exercise Plan Medbridge Access Code: [email or web address]    Consulted and Agree with Plan of Care Patient             Patient will benefit  from skilled therapeutic intervention in order to improve the following deficits and impairments:  Abnormal gait, Decreased knowledge of use of DME, Impaired sensation, Improper body mechanics, Pain, Decreased mobility, Increased muscle spasms, Postural dysfunction, Decreased activity tolerance, Decreased endurance, Decreased range of motion, Decreased strength, Hypomobility, Impaired perceived functional ability, Decreased balance, Difficulty walking, Impaired flexibility, Obesity  Visit Diagnosis: Chronic bilateral low back pain with bilateral sciatica  Stiffness of right hip, not elsewhere classified  Stiffness of left hip, not elsewhere classified  Difficulty in walking, not elsewhere classified  Muscle weakness (generalized)     Problem List Patient Active Problem List   Diagnosis Date Noted   Abdominal pain 07/30/2020   Choledocholithiasis 07/30/2020   Hypoglycemia 07/30/2020   Dehydration 07/30/2020   Hyperlipidemia 07/30/2020   Acute renal failure superimposed on stage 3a chronic kidney disease ([place]) 07/30/2020   Acute cholecystitis 07/29/2020   Sepsis ([place]) 08/15/2019   NSTEMI (non-ST elevated myocardial infarction) ([place]) 06/07/2018   Lymphedema 05/12/2018   Pleural effusion    FUO (fever of unknown origin)    MGUS (monoclonal gammopathy of unknown significance)    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   Healthcare associated bacterial pneumonia 04/25/2018   Community acquired pneumonia 04/12/2018   Pneumonia 04/12/2018   AKI (acute kidney injury) ([place]) 03/23/2018   Pericarditis with effusion 03/23/2018   Schizoaffective disorder, depressive type ([place]) 11/04/2017   HIV positive ([place]) 11/04/2017   Orthostatic dizziness 07/31/2017   Chronic diastolic heart failure ([place]) 07/29/2017   HTN (hypertension) 07/29/2017   Diabetes ([place]) 07/29/2017   Schizophrenia ([place]) 07/29/2017    Danny Cook, PT, DPT 04/24/21, 5:40 PM   [facility] [address] [place], [address] Phone: [phone]   Fax:  [phone]  Name: Danny Cook. MRN: [id number] Date of Birth: 24-May-1961

## 2021-04-25 ENCOUNTER — Ambulatory Visit: Payer: Medicare Other | Admitting: Anesthesiology

## 2021-04-25 ENCOUNTER — Other Ambulatory Visit: Payer: Self-pay

## 2021-04-25 ENCOUNTER — Encounter: Payer: Self-pay | Admitting: Ophthalmology

## 2021-04-25 ENCOUNTER — Ambulatory Visit
Admission: RE | Admit: 2021-04-25 | Discharge: 2021-04-25 | Disposition: A | Discharge status: Home / Self Care | Payer: Medicare Other | Attending: Ophthalmology | Admitting: Ophthalmology

## 2021-04-25 ENCOUNTER — Encounter
Admission: RE | Disposition: A | Discharge status: Home / Self Care | Payer: Self-pay | Source: Home / Self Care | Attending: Ophthalmology

## 2021-04-25 DIAGNOSIS — Z21 Asymptomatic human immunodeficiency virus [HIV] infection status: Secondary | ICD-10-CM | POA: Insufficient documentation

## 2021-04-25 DIAGNOSIS — K219 Gastro-esophageal reflux disease without esophagitis: Secondary | ICD-10-CM | POA: Insufficient documentation

## 2021-04-25 DIAGNOSIS — E7800 Pure hypercholesterolemia, unspecified: Secondary | ICD-10-CM | POA: Diagnosis not present

## 2021-04-25 DIAGNOSIS — Z6834 Body mass index (BMI) 34.0-34.9, adult: Secondary | ICD-10-CM | POA: Diagnosis not present

## 2021-04-25 DIAGNOSIS — Z7984 Long term (current) use of oral hypoglycemic drugs: Secondary | ICD-10-CM | POA: Diagnosis not present

## 2021-04-25 DIAGNOSIS — I130 Hypertensive heart and chronic kidney disease with heart failure and stage 1 through stage 4 chronic kidney disease, or unspecified chronic kidney disease: Secondary | ICD-10-CM | POA: Insufficient documentation

## 2021-04-25 DIAGNOSIS — Z794 Long term (current) use of insulin: Secondary | ICD-10-CM | POA: Insufficient documentation

## 2021-04-25 DIAGNOSIS — G4730 Sleep apnea, unspecified: Secondary | ICD-10-CM | POA: Insufficient documentation

## 2021-04-25 DIAGNOSIS — Z79899 Other long term (current) drug therapy: Secondary | ICD-10-CM | POA: Diagnosis not present

## 2021-04-25 DIAGNOSIS — Z87442 Personal history of urinary calculi: Secondary | ICD-10-CM | POA: Diagnosis not present

## 2021-04-25 DIAGNOSIS — E1136 Type 2 diabetes mellitus with diabetic cataract: Secondary | ICD-10-CM | POA: Diagnosis not present

## 2021-04-25 DIAGNOSIS — H2511 Age-related nuclear cataract, right eye: Secondary | ICD-10-CM | POA: Insufficient documentation

## 2021-04-25 DIAGNOSIS — N189 Chronic kidney disease, unspecified: Secondary | ICD-10-CM | POA: Insufficient documentation

## 2021-04-25 DIAGNOSIS — E1122 Type 2 diabetes mellitus with diabetic chronic kidney disease: Secondary | ICD-10-CM | POA: Diagnosis not present

## 2021-04-25 DIAGNOSIS — E669 Obesity, unspecified: Secondary | ICD-10-CM | POA: Diagnosis not present

## 2021-04-25 DIAGNOSIS — I509 Heart failure, unspecified: Secondary | ICD-10-CM | POA: Insufficient documentation

## 2021-04-25 DIAGNOSIS — Z888 Allergy status to other drugs, medicaments and biological substances status: Secondary | ICD-10-CM | POA: Insufficient documentation

## 2021-04-25 HISTORY — PX: CATARACT EXTRACTION W/PHACO: SHX586

## 2021-04-25 LAB — GLUCOSE, CAPILLARY: Glucose-Capillary: 110 mg/dL — ABNORMAL HIGH (ref 70–99)

## 2021-04-25 SURGERY — PHACOEMULSIFICATION, CATARACT, WITH IOL INSERTION
Anesthesia: Monitor Anesthesia Care | Site: Eye | Laterality: Right

## 2021-04-25 MED ORDER — SODIUM CHLORIDE 0.9 % IV SOLN
INTRAVENOUS | Status: DC | PRN
Start: 2021-04-25 — End: 2021-04-25

## 2021-04-25 MED ORDER — MIDAZOLAM HCL 2 MG/2ML IJ SOLN
INTRAMUSCULAR | Status: DC | PRN
Start: 2021-04-25 — End: 2021-04-25
  Administered 2021-04-25: 1 mg via INTRAVENOUS
  Administered 2021-04-25: .5 mg via INTRAVENOUS

## 2021-04-25 MED ORDER — ONDANSETRON HCL 4 MG/2ML IJ SOLN
INTRAMUSCULAR | Status: DC | PRN
Start: 2021-04-25 — End: 2021-04-25
  Administered 2021-04-25: 4 mg via INTRAVENOUS

## 2021-04-25 MED ORDER — MOXIFLOXACIN HCL 0.5 % OP SOLN
OPHTHALMIC | Status: DC | PRN
  Administered 2021-04-25: 0.2 mL via OPHTHALMIC

## 2021-04-25 MED ORDER — PHENYLEPHRINE HCL 10 % OP SOLN
OPHTHALMIC | Status: AC
  Administered 2021-04-25: 1 [drp] via OPHTHALMIC
  Filled 2021-04-25: qty 5

## 2021-04-25 MED ORDER — SODIUM CHLORIDE 0.9 % IV SOLN: INTRAVENOUS | Status: DC

## 2021-04-25 MED ORDER — CYCLOPENTOLATE HCL 2 % OP SOLN
OPHTHALMIC | Status: AC
  Filled 2021-04-25: qty 2

## 2021-04-25 MED ORDER — FENTANYL CITRATE (PF) 100 MCG/2ML IJ SOLN
INTRAMUSCULAR | Status: DC | PRN
  Administered 2021-04-25: 25 ug via INTRAVENOUS

## 2021-04-25 MED ORDER — BRIMONIDINE TARTRATE-TIMOLOL 0.2-0.5 % OP SOLN
OPHTHALMIC | Status: DC | PRN
  Administered 2021-04-25: 1 [drp] via OPHTHALMIC

## 2021-04-25 MED ORDER — FENTANYL CITRATE (PF) 100 MCG/2ML IJ SOLN
INTRAMUSCULAR | Status: AC
  Filled 2021-04-25: qty 2

## 2021-04-25 MED ORDER — PHENYLEPHRINE HCL 2.5 % OP SOLN: 1.0000 [drp] | Freq: Once | OPHTHALMIC | Status: DC

## 2021-04-25 MED ORDER — SIGHTPATH DOSE#1 BSS IO SOLN
INTRAOCULAR | Status: DC | PRN
  Administered 2021-04-25: 15 mL

## 2021-04-25 MED ORDER — SIGHTPATH DOSE#1 BSS IO SOLN
INTRAOCULAR | Status: DC | PRN
  Administered 2021-04-25: 1 mL via INTRAMUSCULAR

## 2021-04-25 MED ORDER — MIDAZOLAM HCL 2 MG/2ML IJ SOLN
INTRAMUSCULAR | Status: AC
  Filled 2021-04-25: qty 2

## 2021-04-25 MED ORDER — SIGHTPATH DOSE#1 NA CHONDROIT SULF-NA HYALURON 40-17 MG/ML IO SOLN
INTRAOCULAR | Status: DC | PRN
  Administered 2021-04-25: 1 mL via INTRAOCULAR

## 2021-04-25 MED ORDER — MOXIFLOXACIN HCL 0.5 % OP SOLN
OPHTHALMIC | Status: AC
  Filled 2021-04-25: qty 3

## 2021-04-25 MED ORDER — PHENYLEPHRINE HCL 10 % OP SOLN
1.0000 [drp] | OPHTHALMIC | Status: DC
  Administered 2021-04-25 (×3): 1 [drp] via OPHTHALMIC

## 2021-04-25 MED ORDER — CYCLOPENTOLATE-PHENYLEPHRINE 0.2-1 % OP SOLN
1.0000 [drp] | OPHTHALMIC | Status: AC
  Administered 2021-04-25 (×3): 1 [drp] via OPHTHALMIC
  Filled 2021-04-25: qty 2

## 2021-04-25 MED ORDER — TETRACAINE HCL 0.5 % OP SOLN
OPHTHALMIC | Status: AC
  Administered 2021-04-25: 1 [drp] via OPHTHALMIC
  Filled 2021-04-25: qty 4

## 2021-04-25 MED ORDER — TETRACAINE HCL 0.5 % OP SOLN: 1.0000 [drp] | Freq: Once | OPHTHALMIC | Status: AC

## 2021-04-25 MED ORDER — SIGHTPATH DOSE#1 BSS IO SOLN
INTRAOCULAR | Status: DC | PRN
  Administered 2021-04-25: 50 mL via OPHTHALMIC

## 2021-04-25 SURGICAL SUPPLY — 14 items
GLOVE SURG ENC MOIS LTX SZ8 (GLOVE) ×2 IMPLANT
GLOVE SURG ENC TEXT LTX SZ6.5 (GLOVE) ×2 IMPLANT
GLOVE SURG ENC TEXT LTX SZ8 (GLOVE) ×2 IMPLANT
GOWN STRL REUS W/ TWL LRG LVL3 (GOWN DISPOSABLE) ×2 IMPLANT
GOWN STRL REUS W/TWL LRG LVL3 (GOWN DISPOSABLE) ×4
LENS IOL TECNIS EYHANCE 20.5 (Intraocular Lens) ×1 IMPLANT
NDL FILTER BLUNT 18X1 1/2 (NEEDLE) ×1 IMPLANT
NEEDLE FILTER BLUNT 18X 1/2SAF (NEEDLE) ×1
NEEDLE FILTER BLUNT 18X1 1/2 (NEEDLE) ×1 IMPLANT
SYR 3ML LL SCALE MARK (SYRINGE) ×2 IMPLANT
SYR 5ML LL (SYRINGE) IMPLANT
SYR TB 1ML LUER SLIP (SYRINGE) ×2 IMPLANT
WATER STERILE IRR 250ML POUR (IV SOLUTION) ×2 IMPLANT
WIPE NON LINTING 3.25X3.25 (MISCELLANEOUS) IMPLANT

## 2021-04-25 NOTE — Transfer of Care (Signed)
Immediate Anesthesia Transfer of Care Note  Patient: Danny Cook.  Procedure(s) Performed: CATARACT EXTRACTION PHACO AND INTRAOCULAR LENS PLACEMENT (IOC) RIGHT DIABETIC (Right: Eye)  Patient Location: PACU  Anesthesia Type:MAC  Level of Consciousness: awake and patient cooperative  Airway & Oxygen Therapy: Patient Spontanous Breathing  Post-op Assessment: Report given to RN and Post -op Vital signs reviewed and stable  Post vital signs: Reviewed and stable  Last Vitals:  Vitals Value Taken Time  BP 98/66 04/25/21 1029  Temp 36.1 C 04/25/21 1029  Pulse 78 04/25/21 1029  Resp 16 04/25/21 1029  SpO2 95 % 04/25/21 1029    Last Pain:  Vitals:   04/25/21 1029  TempSrc: Temporal  PainSc: 0-No pain         Complications: No notable events documented.

## 2021-04-25 NOTE — Op Note (Signed)
PREOPERATIVE DIAGNOSIS:  Nuclear sclerotic cataract of the right eye.   POSTOPERATIVE DIAGNOSIS:  Cataract   OPERATIVE PROCEDURE:ORPROCALL@   SURGEON:  Birder Robson, MD.   ANESTHESIA:  Anesthesiologist: Iran Ouch, MD CRNA: Kelton Pillar, CRNA  1.      Managed anesthesia care. 2.      0.15ml of Shugarcaine was instilled in the eye following the paracentesis.   COMPLICATIONS:  None.   TECHNIQUE:   Stop and chop   DESCRIPTION OF PROCEDURE:  The patient was examined and consented in the preoperative holding area where the aforementioned topical anesthesia was applied to the right eye and then brought back to the Operating Room where the right eye was prepped and draped in the usual sterile ophthalmic fashion and a lid speculum was placed. A paracentesis was created with the side port blade and the anterior chamber was filled with viscoelastic. A near clear corneal incision was performed with the steel keratome. A continuous curvilinear capsulorrhexis was performed with a cystotome followed by the capsulorrhexis forceps. Hydrodissection and hydrodelineation were carried out with BSS on a blunt cannula. The lens was removed in a stop and chop  technique and the remaining cortical material was removed with the irrigation-aspiration handpiece. The capsular bag was inflated with viscoelastic and the Technis [id number]  lens was placed in the capsular bag without complication. The remaining viscoelastic was removed from the eye with the irrigation-aspiration handpiece. The wounds were hydrated. The anterior chamber was flushed with BSS and the eye was inflated to physiologic pressure. 0.50ml of Vigamox was placed in the anterior chamber. The wounds were found to be water tight. The eye was dressed with Combigan. The patient was given protective glasses to wear throughout the day and a shield with which to sleep tonight. The patient was also given drops with which to begin a drop  regimen today and will follow-up with me in one day. Implant Name Type Inv. Item Serial No. Manufacturer Lot No. LRB No. Used Action  LENS IOL TECNIS EYHANCE 20.5 - [id number] Intraocular Lens LENS IOL TECNIS EYHANCE 20.5 [id number] JOHNSON   Right 1 Implanted   Procedure(s) with comments: CATARACT EXTRACTION PHACO AND INTRAOCULAR LENS PLACEMENT (IOC) RIGHT DIABETIC (Right) - 3.32 0:23.0  Electronically signed: Birder Robson 04/25/2021 10:25 AM

## 2021-04-25 NOTE — Discharge Instructions (Signed)

## 2021-04-25 NOTE — H&P (Signed)
[facility]   Primary Care Physician:  Ebbie Ridge, MD Ophthalmologist: Dr. George Ina  Pre-Procedure History & Physical: HPI:  Danny Cook. is a 60 y.o. male here for cataract surgery.   Past Medical History:  Diagnosis Date   Anemia    Anxiety    Arthritis    CHF (congestive heart failure) ([place])    Chronic kidney disease    Renal Insufficiency Syndrome; Glomerulosclerosis 2013   Depressed    Diabetes mellitus without complication (HCC)    GERD (gastroesophageal reflux disease)    High cholesterol    History of kidney stones    HIV (human immunodeficiency virus infection) (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[place])    Paranoid disorder ([place])    Pneumonia    Schizophrenia, paranoid ([place])    Sleep apnea    TIA (transient ischemic attack)     Past Surgical History:  Procedure Laterality Date   CARDIAC CATHETERIZATION     COLONOSCOPY WITH PROPOFOL N/A 09/03/2015   Procedure: COLONOSCOPY WITH PROPOFOL;  Surgeon: Lollie Sails, MD;  Location: [facility] ENDOSCOPY;  Service: Endoscopy;  Laterality: N/A;   COLONOSCOPY WITH PROPOFOL N/A 09/17/2020   Procedure: COLONOSCOPY WITH PROPOFOL;  Surgeon: Lesly Rubenstein, MD;  Location: [facility] ENDOSCOPY;  Service: Endoscopy;  Laterality: N/A;  PREFERS 9AM OR LATER DUE TO TRANSPORTATION   DG TEETH FULL     ESOPHAGOGASTRODUODENOSCOPY (EGD) WITH PROPOFOL N/A 07/01/2016   Dr. [name], [facility]. abnormal esophageal motility, suspicious for presbyesophagus, bile gastritis s/p biopsy, non-bleeding erosive gastropathy s/p biopsy, normal duodenum, path with negative H.pylori and +chronic active gastritis   ESOPHAGOGASTRODUODENOSCOPY (EGD) WITH PROPOFOL N/A 09/17/2020   Procedure: ESOPHAGOGASTRODUODENOSCOPY (EGD) WITH PROPOFOL;  Surgeon: Lesly Rubenstein, MD;  Location: [facility] ENDOSCOPY;  Service: Endoscopy;  Laterality: N/A;   EXTRACORPOREAL SHOCK WAVE LITHOTRIPSY Right 11/01/2020   Procedure: EXTRACORPOREAL SHOCK WAVE  LITHOTRIPSY (ESWL);  Surgeon: Abbie Sons, MD;  Location: [facility] ORS;  Service: Urology;  Laterality: Right;    Prior to Admission medications   Medication Sig Start Date End Date Taking? Authorizing Provider  abacavir-dolutegravir-lamiVUDine (TRIUMEQ) 600-50-300 MG tablet Take 1 tablet by mouth at bedtime. 07/21/19  Yes Tsosie Billing, MD  acetaminophen (TYLENOL) 650 MG CR tablet Take 650 mg by mouth every 8 (eight) hours as needed for pain.   Yes [provider]  albuterol (PROVENTIL HFA;VENTOLIN HFA) 108 (90 Base) MCG/ACT inhaler Inhale 2 puffs into the lungs every 6 (six) hours as needed for wheezing or shortness of breath. 04/14/18  Yes Loletha Grayer, MD  aspirin EC 325 MG tablet Take 325 mg by mouth daily.   Yes [provider]  benztropine (COGENTIN) 0.5 MG tablet Take 1.5 mg by mouth daily.   Yes [provider]  bismuth subsalicylate (PEPTO BISMOL) 262 MG/15ML suspension Take 15 mLs by mouth every 6 (six) hours as needed for indigestion.   Yes [provider]  brimonidine-timolol (COMBIGAN) 0.2-0.5 % ophthalmic solution Place 1 drop into both eyes every 12 (twelve) hours.   Yes [provider]  butalbital-acetaminophen-caffeine (FIORICET) 50-325-40 MG tablet Take 1 tablet by mouth every 4 (four) hours as needed for headache.   Yes [provider]  diazepam (VALIUM) 5 MG tablet Take 2.5-5 mg by mouth See admin instructions. 5 mg in the morning, 2.5 mg midday, 5 mg in the evening   Yes [provider]  divalproex (DEPAKOTE ER) 500 MG 24 hr tablet Take 500 mg  by mouth at bedtime.    Yes [provider]  docusate sodium (COLACE) 250 MG capsule Take 250 mg by mouth 2 (two) times daily.   Yes [provider]  donepezil (ARICEPT) 10 MG tablet Take 10 mg by mouth daily. 09/13/20  Yes [provider]  FANAPT 12 MG TABS Take 12 mg by mouth 2 (two) times daily.  11/14/16  Yes [provider]   Fluocinolone Acetonide 0.01 % OIL Place 2-4 drops in ear(s) every other day. At bedtime for dry skin   Yes [provider]  fluticasone (FLOVENT HFA) 220 MCG/ACT inhaler Inhale 2 puffs into the lungs 2 (two) times daily. Rinse out mouth afterwards 04/14/18  Yes Wieting, Richard, MD  gabapentin (NEURONTIN) 600 MG tablet Take 600 mg by mouth 3 (three) times daily.   Yes [provider]  gemfibrozil (LOPID) 600 MG tablet Take 600 mg by mouth 2 (two) times daily before a meal.   Yes [provider]  glimepiride (AMARYL) 4 MG tablet Take 4 mg by mouth daily with breakfast.  06/06/19  Yes [provider]  glycopyrrolate (ROBINUL) 2 MG tablet Take 1 mg by mouth 2 (two) times daily. 10/15/20  Yes [provider]  guaiFENesin (MUCINEX) 600 MG 12 hr tablet Take by mouth 2 (two) times daily.   Yes [provider]  HUMALOG KWIKPEN 100 UNIT/ML KwikPen Inject 6 Units into the skin 3 (three) times daily. Per sliding scale 09/10/20  Yes [provider]  Insulin Degludec (TRESIBA FLEXTOUCH) 200 UNIT/ML SOPN Inject 10 Units into the skin daily. Patient taking differently: Inject 50 Units into the skin daily. 08/19/19  Yes Nolberto Hanlon, MD  losartan (COZAAR) 25 MG tablet Take 25 mg by mouth daily. 02/02/21  Yes [provider]  memantine (NAMENDA) 10 MG tablet Take 10 mg by mouth 2 (two) times daily.   Yes [provider]  metFORMIN (GLUCOPHAGE) 1000 MG tablet Take 1,000 mg by mouth 2 (two) times daily with a meal.   Yes [provider]  mirtazapine (REMERON) 30 MG tablet Take 30 mg by mouth at bedtime. 04/21/18  Yes [provider]  mometasone (ELOCON) 0.1 % lotion Apply 1 application topically. 08/31/20  Yes [provider]  mometasone (ELOCON) 0.1 % ointment Apply 1 application topically See admin instructions. Apply 4 drops to ear canal at bedtime as needed for itching and dry skin.   Yes [provider]   Multiple Vitamin (MULTIVITAMIN) tablet Take 1 tablet by mouth daily.   Yes [provider]  nitroGLYCERIN (NITROSTAT) 0.4 MG SL tablet Place 0.4 mg under the tongue every 5 (five) minutes as needed for chest pain.    Yes [provider]  nystatin cream (MYCOSTATIN) Apply 1 application topically 2 (two) times daily as needed for dry skin.   Yes [provider]  Omega-3 Fatty Acids (FISH OIL) 1000 MG CAPS Take 1,000 mg by mouth daily.   Yes [provider]  omeprazole (PRILOSEC) 40 MG capsule Take 40 mg by mouth 2 (two) times daily.    Yes [provider]  ondansetron (ZOFRAN) 4 MG tablet Take 4 mg by mouth every 8 (eight) hours as needed for nausea or vomiting. 08/11/20  Yes [provider]  OZEMPIC, 1 MG/DOSE, 4 MG/3ML SOPN Inject 1 mg into the skin every Monday. 10/10/20  Yes [provider]  polyethylene glycol (MIRALAX / GLYCOLAX) 17 g packet Take 17 g by mouth daily as needed for  moderate constipation.   Yes [provider]  rosuvastatin (CRESTOR) 40 MG tablet Take 40 mg by mouth daily.    Yes [provider]  senna (SENOKOT) 8.6 MG TABS tablet Take 1 tablet by mouth daily.    Yes [provider]  sucralfate (CARAFATE) 1 g tablet Take 1 g by mouth 4 (four) times daily.  04/21/18  Yes [provider]  tamsulosin (FLOMAX) 0.4 MG CAPS capsule Take 0.4 mg by mouth daily. 08/02/19  Yes [provider]  venlafaxine XR (EFFEXOR-XR) 37.5 MG 24 hr capsule Take 37.5 mg by mouth daily. 04/08/21  Yes [provider]  venlafaxine XR (EFFEXOR-XR) 75 MG 24 hr capsule Take 1 capsule (75 mg total) by mouth daily with breakfast. 11/09/17  Yes McNew, Tyson Babinski, MD  ziprasidone (GEODON) 40 MG capsule Take 40 mg by mouth daily.   Yes [provider]  HYDROcodone-acetaminophen (NORCO/VICODIN) 5-325 MG tablet Take 1 tablet by mouth every 8 (eight) hours as needed for moderate pain. Patient not  taking: No sig reported 11/01/20   Stoioff, Ronda Fairly, MD  oxybutynin (DITROPAN) 5 MG tablet Take 1 tablet (5 mg total) by mouth every 8 (eight) hours as needed for bladder spasms. Patient not taking: No sig reported 11/09/20   Debroah Loop, PA-C  triamcinolone cream (KENALOG) 0.1 % Apply 1 application topically 2 (two) times daily as needed. Patient not taking: No sig reported 11/16/16   Little, Traci M, PA-C    Allergies as of 03/22/2021 - Review Complete 03/05/2021  Allergen Reaction Noted   Trazodone and nefazodone  06/27/2016   Trazodone hcl Other (See Comments) 06/27/2016   Viread [tenofovir disoproxil]  12/15/2014   Etodolac Rash 06/27/2016    Family History  Problem Relation Age of Onset   Heart attack Mother    Diabetes Mellitus II Mother    Mental illness Mother    CAD Mother    Heart attack Father    CAD Father    Hypertension Father     Social History   Socioeconomic History   Marital status: Single    Spouse name: Not on file   Number of children: Not on file   Years of education: 11   Highest education level: GED or equivalent  Occupational History   Not on file  Tobacco Use   Smoking status: Never   Smokeless tobacco: Never  Vaping Use   Vaping Use: Never used  Substance and Sexual Activity   Alcohol use: No   Drug use: No    Comment: denies street drugs   Sexual activity: Never  Other Topics Concern   Not on file  Social History Narrative   Not on file   Social Determinants of Health   Financial Resource Strain: Not on file  Food Insecurity: Not on file  Transportation Needs: Not on file  Physical Activity: Not on file  Stress: Not on file  Social Connections: Not on file  Intimate Partner Violence: Not on file    Review of Systems: See HPI, otherwise negative ROS  Physical Exam: BP 112/82   Pulse 66   Temp (!) 97 F (36.1 C) (Temporal)   Resp 18   Ht 5\' 7"  (1.702 m)   Wt 95.3 kg   SpO2 97%   BMI 32.89 kg/m  General:    Alert, cooperative in NAD Head:  Normocephalic and atraumatic. Respiratory:  Normal work of breathing. Cardiovascular:  RRR  Impression/Plan: Danny Cook. is here for  cataract surgery.  Risks, benefits, limitations, and alternatives regarding cataract surgery have been reviewed with the patient.  Questions have been answered.  All parties agreeable.   Birder Robson, MD  04/25/2021, 9:53 AM

## 2021-04-25 NOTE — Anesthesia Preprocedure Evaluation (Addendum)
Anesthesia Evaluation  Patient identified by MRN, date of birth, ID band Patient awake    Reviewed: Allergy & Precautions, H&P , NPO status , Patient's Chart, lab work & pertinent test results  History of Anesthesia Complications Negative for: history of anesthetic complications  Airway Mallampati: III  TM Distance: <3 FB Neck ROM: limited    Dental  (+) Edentulous Upper, Upper Dentures, Lower Dentures, Edentulous Lower, Dental Advidsory Given   Pulmonary neg shortness of breath, sleep apnea , neg COPD, neg recent URI,    Pulmonary exam normal breath sounds clear to auscultation       Cardiovascular Exercise Tolerance: Good hypertension, Pt. on medications (-) angina+ Past MI (h/o NSTEMI) and +CHF (DD)  (-) Cardiac Stents and (-) DOE Normal cardiovascular exam(-) dysrhythmias + Valvular Problems/Murmurs  Rhythm:regular Rate:Normal  Orthostatic dizziness   Neuro/Psych neg Seizures PSYCHIATRIC DISORDERS Anxiety Depression Schizophrenia TIA   GI/Hepatic Neg liver ROS, GERD  Controlled and Medicated,  Endo/Other  diabetes, Well Controlled, Type 2  Renal/GU CRFRenal diseaseGlomerulosclerosis   negative genitourinary   Musculoskeletal  (+) Arthritis ,   Abdominal (+) + obese,   Peds  Hematology  (+) anemia ,   Anesthesia Other Findings Past Medical History: No date: Anemia No date: Anxiety No date: Chronic kidney disease     Comment: Renal Insufficiency Syndrome;               Glomerulosclerosis 9753 No date: Complication of anesthesia No date: Depressed No date: Diabetes mellitus without complication (HCC) No date: High cholesterol No date: HIV (human immunodeficiency virus infection) (* No date: Hypertension No date: 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[place]) No date: Paranoid disorder ([place]) No date: Schizophrenia, paranoid ([place]) No date: Sleep apnea  Past Surgical History: 09/03/2015: COLONOSCOPY WITH PROPOFOL N/A     Comment:  Procedure: COLONOSCOPY WITH PROPOFOL;                Surgeon: Lollie Sails, MD;  Location: [facility]              ENDOSCOPY;  Service: Endoscopy;  Laterality:               N/A; No date: DG TEETH FULL  BMI    Body Mass Index:  34.14 kg/m      Reproductive/Obstetrics negative OB ROS                            Anesthesia Physical  Anesthesia Plan  ASA: III  Anesthesia Plan: MAC   Post-op Pain Management:    Induction: Intravenous  PONV Risk Score and Plan: 2 and Treatment may vary due to age or medical condition  Airway Management Planned: Natural Airway and Nasal Cannula  Additional Equipment:   Intra-op Plan:   Post-operative Plan:   Informed Consent: I have reviewed the patients History and Physical, chart, labs and discussed the procedure including the risks, benefits and alternatives for the proposed anesthesia with the patient or authorized representative who has indicated his/her understanding and acceptance.     Dental Advisory Given  Plan Discussed with: Anesthesiologist, CRNA and Surgeon  Anesthesia Plan Comments:         Anesthesia Quick Evaluation

## 2021-04-28 NOTE — Anesthesia Postprocedure Evaluation (Signed)
Anesthesia Post Note  Patient: Danny Cook.  Procedure(s) Performed: CATARACT EXTRACTION PHACO AND INTRAOCULAR LENS PLACEMENT (IOC) RIGHT DIABETIC (Right: Eye)  Patient location during evaluation: Phase II Anesthesia Type: MAC Level of consciousness: awake and alert Pain management: pain level controlled Vital Signs Assessment: post-procedure vital signs reviewed and stable Respiratory status: spontaneous breathing, nonlabored ventilation and respiratory function stable Cardiovascular status: blood pressure returned to baseline and stable Postop Assessment: no apparent nausea or vomiting Anesthetic complications: no   No notable events documented.   Last Vitals:  Vitals:   04/25/21 1029 04/25/21 1041  BP: 98/66 95/60  Pulse: 78 61  Resp: 16 16  Temp: (!) 36.1 C   SpO2: 95% 94%    Last Pain:  Vitals:   04/25/21 1041  TempSrc:   PainSc: 0-No pain                 [place] Ouch

## 2021-04-30 ENCOUNTER — Ambulatory Visit: Payer: Medicare Other | Attending: Neurosurgery | Admitting: Physical Therapy

## 2021-04-30 DIAGNOSIS — M5441 Lumbago with sciatica, right side: Secondary | ICD-10-CM | POA: Diagnosis present

## 2021-04-30 DIAGNOSIS — M5442 Lumbago with sciatica, left side: Secondary | ICD-10-CM | POA: Diagnosis present

## 2021-04-30 DIAGNOSIS — M25651 Stiffness of right hip, not elsewhere classified: Secondary | ICD-10-CM | POA: Diagnosis present

## 2021-04-30 DIAGNOSIS — M6281 Muscle weakness (generalized): Secondary | ICD-10-CM | POA: Diagnosis present

## 2021-04-30 DIAGNOSIS — G8929 Other chronic pain: Secondary | ICD-10-CM | POA: Insufficient documentation

## 2021-04-30 DIAGNOSIS — M25652 Stiffness of left hip, not elsewhere classified: Secondary | ICD-10-CM | POA: Diagnosis present

## 2021-04-30 DIAGNOSIS — R262 Difficulty in walking, not elsewhere classified: Secondary | ICD-10-CM | POA: Diagnosis present

## 2021-04-30 NOTE — Therapy (Signed)
[name] [address] [place], [address] Phone: [phone]   Fax:  [phone]  Physical Therapy Treatment  Patient Details  Name: Danny Cook. MRN: [id number] Date of Birth: 1960-11-09 Referring Provider (PT): [name], [place] / [name] (neurosurgery)   Encounter Date: 04/30/2021   PT End of Session - 04/30/21 0951     Visit Number 4    Number of Visits 24    Date for PT Re-Evaluation 07/09/21    Authorization Type [employer] MEDICARE reporting period from 04/16/2021    Progress Note Due on Visit 10    PT Start Time 0930    PT Stop Time 1015    PT Time Calculation (min) 45 min    Activity Tolerance Patient tolerated treatment well;Patient limited by pain    Behavior During Therapy [facility] for tasks assessed/performed             Past Medical History:  Diagnosis Date   Anemia    Anxiety    Arthritis    CHF (congestive heart failure) ([place])    Chronic kidney disease    Renal Insufficiency Syndrome; Glomerulosclerosis 2013   Depressed    Diabetes mellitus without complication ([place])    GERD (gastroesophageal reflux disease)    High cholesterol    History of kidney stones    HIV (human immunodeficiency virus infection) ([place])    Hypertension    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[place])    Paranoid disorder ([place])    Pneumonia    Schizophrenia, paranoid ([place])    Sleep apnea    TIA (transient ischemic attack)     Past Surgical History:  Procedure Laterality Date   CARDIAC CATHETERIZATION     CATARACT EXTRACTION W/PHACO Right 04/25/2021   Procedure: CATARACT EXTRACTION PHACO AND INTRAOCULAR LENS PLACEMENT ([place]) RIGHT DIABETIC;  Surgeon: Birder Robson, MD;  Location: [facility] ORS;  Service: Ophthalmology;  Laterality: Right;  3.32 0:23.0   COLONOSCOPY WITH PROPOFOL N/A 09/03/2015   Procedure: COLONOSCOPY WITH PROPOFOL;  Surgeon: Lollie Sails, MD;  Location: [facility];  Service:  Endoscopy;  Laterality: N/A;   COLONOSCOPY WITH PROPOFOL N/A 09/17/2020   Procedure: COLONOSCOPY WITH PROPOFOL;  Surgeon: Lesly Rubenstein, MD;  Location: [facility];  Service: Endoscopy;  Laterality: N/A;  PREFERS 9AM OR LATER DUE TO TRANSPORTATION   DG TEETH FULL     ESOPHAGOGASTRODUODENOSCOPY (EGD) WITH PROPOFOL N/A 07/01/2016   Dr. [name], [facility]. abnormal esophageal motility, suspicious for presbyesophagus, bile gastritis s/p biopsy, non-bleeding erosive gastropathy s/p biopsy, normal duodenum, path with negative H.pylori and +chronic active gastritis   ESOPHAGOGASTRODUODENOSCOPY (EGD) WITH PROPOFOL N/A 09/17/2020   Procedure: ESOPHAGOGASTRODUODENOSCOPY (EGD) WITH PROPOFOL;  Surgeon: Lesly Rubenstein, MD;  Location: [facility];  Service: Endoscopy;  Laterality: N/A;   EXTRACORPOREAL SHOCK WAVE LITHOTRIPSY Right 11/01/2020   Procedure: EXTRACORPOREAL SHOCK WAVE LITHOTRIPSY (ESWL);  Surgeon: Abbie Sons, MD;  Location: [facility];  Service: Urology;  Laterality: Right;    There were no vitals filed for this visit.   Subjective Assessment - 04/30/21 0935     Subjective Pateint reports he has 9/10 pain in his right low back upon arrival. He is wearing eye shades after he had R cateract surgery on Thursday 04/25/21. States his doctor said he could come to PT following the procedure and has no eye pain. Patient reports he has not been able to tell a difference in his back pain since last PT session.  States his back hurts and R and L side in the morning and if he starts moving around a lot it goes downs his legs. He states he remembers every exercise except the one where his knees are bent and has to bring his knees way up (hooklying marching).    Pertinent History Patient is a 60 y.o. male who presents to outpatient physical therapy with a referral for medical diagnosis spinal stenosis of lumbar region with neurogenic claudication, lumbar degenerative disc disease. This patient's  chief complaints consist of chronic constant B low back pain with intermittent B LE pain to the calves leading to the following functional deficits: difficulty getting out of bed, walking, standing activities, household and community ambulation, shopping, bathing (must sit down), walking, transfers, lifting, bending, etc.  States "it affects everything really."   Relevant past medical history and comorbidities include HIV, TIA, schizophrenic (paranoid), Kaposi's sarcoma, HTN, leep apnea, CHF, CKD, Diabetes Mellitus type 2, GERD, Anemia, Arthritis, depression, occasionally symptomatic orthostatic dizziness, cardiac cath, lymphedema (left leg), NSTEMI, Choledocholithiasis (see chart for full PMH).    Patient denies hx of seizures, lung problem (but takes medication for pulmonary dysfunction), unexplained weight loss, changes in bowel or bladder problems, new onset stumbling or dropping things worsening with back pain, spinal surgery.    Limitations Lifting;Standing;Walking;House hold activities    How long can you stand comfortably? zero to a few seconds    Diagnostic tests Lumbar MRI report 04/02/2021: "IMPRESSION:  1. Mild progression of severe multifactorial spinal stenosis at  L3-4.  2. Unchanged moderate spinal stenosis and moderate to severe neural  foraminal stenosis at L4-5.  3. Unchanged mild spinal stenosis and moderate lateral recess  stenosis at L1-2."    Patient Stated Goals "see if he is able to walk again and do exercises at home" be able to stand up straight and not have pain; be able to do the things he used to    Currently in Pain? Yes    Pain Score 9     Pain Onset More than a month ago                TREATMENT:   Denies sensitivity to latex   Therapeutic exercise: to centralize symptoms and improve ROM, strength, muscular endurance, and activity tolerance required for successful completion of functional activities.  - NuStep level 2 using bilateral lower and upper extremities.  Seat/handle setting 9. For improved extremity mobility, muscular endurance, and activity tolerance; and to induce the analgesic effect of aerobic exercise, stimulate improved joint nutrition, and prepare body structures and systems for following interventions. x 6:16 minutes. Pain in right low back slightly increasing with time. Average SPM = 53 - hooklying LTR, 1x20 each side - sidelying hip abduction, 2x10 each side (much harder on right, required physical assistance to maintain adequate body position to work lateral hip muscles).  - prone hip extension, 2x10 each side, tactile cuing at knees to improve hip extension (knee off the mat).  - supine <> prone <> sit with mod I for increased time and SBA from PT to ensure safety (not rolling off table).   Manual therapy: to reduce pain and tissue tension, improve range of motion, neuromodulation, in order to promote improved ability to complete functional activities. - hooklying LAD 3x30 seconds each side (more painful on right than left, reports feel like a good stretch but also makes pain worse in back).  - Sidelying PROM hip flexor stretch, 3x30 seconds each side.  PRONE  with pillows under chest:  - STM to lumbar region and bilateral glutes to decrease pain and spasm. (Painful only near right lumbar paraspinals and QL).  - CPA grade II-IV from T1-S1 with and without KE wedge to assess pain and improve mobility (most tender at upper thoracic spine and lower lumbar spine and sacrum).    Pt required multimodal cuing for proper technique and to facilitate improved neuromuscular control, strength, range of motion, and functional ability resulting in improved performance and form.   HOME EXERCISE PROGRAM Access Code: [email or web address] URL: [email or web address] Date: 04/22/2021 Prepared by: Rosita Kea   Exercises Sit to Stand Without Arm Support - 1 x daily - 3 sets - 10 reps Supine Lower Trunk Rotation - 1 x daily - 1 sets - 20 reps    PT  Education - 04/30/21 0951     Education Details exercise/intervention purpose/form    Person(s) Educated Patient    Methods Explanation;Demonstration;Tactile cues;Verbal cues    Comprehension Verbalized understanding;Returned demonstration;Verbal cues required;Tactile cues required;Need further instruction              PT Short Term Goals - 04/30/21 1027       PT SHORT TERM GOAL #1   Title Be independent with initial home exercise program for self-management of symptoms.    Baseline initial HEP to be established visit 2 as appropriate (04/16/2021); HEP provided visit 2 (04/22/2021);    Time 2    Period Weeks    Status Achieved    Target Date 04/30/21               PT Long Term Goals - 04/16/21 1157       PT LONG TERM GOAL #1   Title Be independent with a long-term home exercise program for self-management of symptoms.    Baseline initial HEP to be established visit 2 as appropriate (04/16/2021);    Time 12    Period Weeks    Status New   TARGET DATE FOR ALL LONG TERM GOALS: 07/09/2021     PT LONG TERM GOAL #2   Title Demonstrate improved FOTO to equal or greater than 46 by visit #12 to demonstrate improvement in overall condition and self-reported functional ability.    Baseline 37 (04/16/2021);    Time 12    Period Weeks    Status New      PT LONG TERM GOAL #3   Title Patient will improve B hip extension to equal or greater than 20 degrees to improve upright standing posture and decrease leg and back pain during upright activities to improve functional mobility.    Baseline ~ 0 degrees bilaterally (04/16/2021);    Time 12    Period Weeks    Status New      PT LONG TERM GOAL #4   Title Patient will complete 5 Time Sit <> Stand Test in equal or less than 12 seconds with no UE support from 18.5 inch or less surface height to improve functional mobilty for transfers and ambulatoin.    Baseline not tested (04/16/2021);    Time 12    Period Weeks    Status New       PT LONG TERM GOAL #5   Title Patient will ambulate equal or greater than 1000 feet with LRAD and mod I during 6 Minute Walk Test to improve household and community mobility.    Baseline Not formally tested; looks for place to sit as soon as he arrives at location of  50-100 feet with no AD (04/16/2021);    Time 12    Period Weeks                   Plan - 04/30/21 1027     Clinical Impression Statement Pateint tolerated treatment with some difficulty due to pain in the low back R > L. Reported mild improvement in symptoms and demonstrated more upright standing posture following interventions. Exercise efforts kept light due to recent eye surgery to minimize intra-abdominal pressure. Patient continues to be very limited in mobility, function, and quality of life due to impairments. Patient would benefit from continued management of limiting condition by skilled physical therapist to address remaining impairments and functional limitations to work towards stated goals and return to PLOF or maximal functional independence.    Personal Factors and Comorbidities Age;Comorbidity 3+;Past/Current Experience;Fitness;Time since onset of injury/illness/exacerbation    Comorbidities Relevant past medical history and comorbidities include HIV, TIA, schizophrenic (paranoid), Kaposi's sarcoma, HTN, leep apnea, CHF, CKD, Diabetes Mellitus type 2, GERD, Anemia, Arthritis, depression, occasionally symptomatic orthostatic dizziness, cardiac cath, lymphedema (left leg), NSTEMI, Choledocholithiasis (see chart for full PMH).    Examination-Activity Limitations Bathing;Bed Mobility;Lift;Stairs;Squat;Bend;Stand;Locomotion Level;Reach Overhead;Carry;Transfers    Examination-Participation Restrictions Shop;Community Activity;Interpersonal Relationship   getting out of bed, walking, standing activities, household and community ambulation, shopping, bathing (must sit down), walking, transfers, lifting, bending, etc.   States "it affects everything really."   Stability/Clinical Decision Making Stable/Uncomplicated    Rehab Potential Fair    PT Frequency 2x / week    PT Duration 12 weeks    PT Treatment/Interventions ADLs/Self Care Home Management;Aquatic Therapy;Cryotherapy;Moist Heat;Traction;Electrical Stimulation;DME Instruction;Gait training;Functional mobility training;Balance training;Therapeutic exercise;Therapeutic activities;Neuromuscular re-education;Patient/family education;Manual techniques;Dry needling;Passive range of motion;Joint Manipulations;Spinal Manipulations;Energy conservation    PT Next Visit Plan interventions to improve hip extension, improve lumbar mobility, LE, core, functional, and postural strengthening as tolerated    PT Home Exercise Plan Medbridge Access Code: [email or web address]    Consulted and Agree with Plan of Care Patient             Patient will benefit from skilled therapeutic intervention in order to improve the following deficits and impairments:  Abnormal gait, Decreased knowledge of use of DME, Impaired sensation, Improper body mechanics, Pain, Decreased mobility, Increased muscle spasms, Postural dysfunction, Decreased activity tolerance, Decreased endurance, Decreased range of motion, Decreased strength, Hypomobility, Impaired perceived functional ability, Decreased balance, Difficulty walking, Impaired flexibility, Obesity  Visit Diagnosis: Chronic bilateral low back pain with bilateral sciatica  Stiffness of right hip, not elsewhere classified  Stiffness of left hip, not elsewhere classified  Difficulty in walking, not elsewhere classified  Muscle weakness (generalized)     Problem List Patient Active Problem List   Diagnosis Date Noted   Abdominal pain 07/30/2020   Choledocholithiasis 07/30/2020   Hypoglycemia 07/30/2020   Dehydration 07/30/2020   Hyperlipidemia 07/30/2020   Acute renal failure superimposed on stage 3a chronic kidney disease ([place])  07/30/2020   Acute cholecystitis 07/29/2020   Sepsis ([place]) 08/15/2019   NSTEMI (non-ST elevated myocardial infarction) ([place]) 06/07/2018   Lymphedema 05/12/2018   Pleural effusion    FUO (fever of unknown origin)    MGUS (monoclonal gammopathy of unknown significance)    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 Graylon Good, PT, DPT 04/30/21, 10:28 AM   [facility] [address] [place], [address] Phone: [phone]   Fax:  [phone]  Name: Rodriquez Thorner. MRN: [id number] Date of Birth: Jan 15, 1961

## 2021-05-06 ENCOUNTER — Ambulatory Visit: Payer: Medicare Other | Admitting: Physical Therapy

## 2021-05-06 ENCOUNTER — Encounter: Payer: Self-pay | Admitting: Physical Therapy

## 2021-05-06 DIAGNOSIS — M5441 Lumbago with sciatica, right side: Secondary | ICD-10-CM

## 2021-05-06 DIAGNOSIS — M25651 Stiffness of right hip, not elsewhere classified: Secondary | ICD-10-CM

## 2021-05-06 DIAGNOSIS — R262 Difficulty in walking, not elsewhere classified: Secondary | ICD-10-CM

## 2021-05-06 DIAGNOSIS — G8929 Other chronic pain: Secondary | ICD-10-CM

## 2021-05-06 DIAGNOSIS — M6281 Muscle weakness (generalized): Secondary | ICD-10-CM

## 2021-05-06 DIAGNOSIS — M5442 Lumbago with sciatica, left side: Secondary | ICD-10-CM | POA: Diagnosis not present

## 2021-05-06 DIAGNOSIS — M25652 Stiffness of left hip, not elsewhere classified: Secondary | ICD-10-CM

## 2021-05-06 NOTE — Therapy (Signed)
[facility] [address] [place], [address] Phone: [phone]   Fax:  [phone]  Physical Therapy Treatment  Patient Details  Name: Danny Cook. MRN: [id number] Date of Birth: 1960/09/03 Referring Provider (PT): [name], [place] / [name] (neurosurgery)   Encounter Date: 05/06/2021   PT End of Session - 05/06/21 1654     Visit Number 5    Number of Visits 24    Date for PT Re-Evaluation 07/09/21    Authorization Type [employer] MEDICARE reporting period from 04/16/2021    Progress Note Due on Visit 10    PT Start Time 1640    PT Stop Time 1720    PT Time Calculation (min) 40 min    Activity Tolerance Patient tolerated treatment well;Patient limited by pain    Behavior During Therapy [facility] for tasks assessed/performed             Past Medical History:  Diagnosis Date   Anemia    Anxiety    Arthritis    CHF (congestive heart failure) ([place])    Chronic kidney disease    Renal Insufficiency Syndrome; Glomerulosclerosis 2013   Depressed    Diabetes mellitus without complication ([place])    GERD (gastroesophageal reflux disease)    High cholesterol    History of kidney stones    HIV (human immunodeficiency virus infection) ([place])    Hypertension    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[place])    Paranoid disorder ([place])    Pneumonia    Schizophrenia, paranoid ([place])    Sleep apnea    TIA (transient ischemic attack)     Past Surgical History:  Procedure Laterality Date   CARDIAC CATHETERIZATION     CATARACT EXTRACTION W/PHACO Right 04/25/2021   Procedure: CATARACT EXTRACTION PHACO AND INTRAOCULAR LENS PLACEMENT ([place]) RIGHT DIABETIC;  Surgeon: Birder Robson, MD;  Location: [facility] ORS;  Service: Ophthalmology;  Laterality: Right;  3.32 0:23.0   COLONOSCOPY WITH PROPOFOL N/A 09/03/2015   Procedure: COLONOSCOPY WITH PROPOFOL;  Surgeon: Lollie Sails, MD;  Location[facility] ENDOSCOPY;  Service:  Endoscopy;  Laterality: N/A;   COLONOSCOPY WITH PROPOFOL N/A 09/17/2020   Procedure: COLONOSCOPY WITH PROPOFOL;  Surgeon: Lesly Rubenstein, MD;  Location: [facility] ENDOSCOPY;  Service: Endoscopy;  Laterality: N/A;  PREFERS 9AM OR LATER DUE TO TRANSPORTATION   DG TEETH FULL     ESOPHAGOGASTRODUODENOSCOPY (EGD) WITH PROPOFOL N/A 07/01/2016   Dr. [name], [facility]. abnormal esophageal motility, suspicious for presbyesophagus, bile gastritis s/p biopsy, non-bleeding erosive gastropathy s/p biopsy, normal duodenum, path with negative H.pylori and +chronic active gastritis   ESOPHAGOGASTRODUODENOSCOPY (EGD) WITH PROPOFOL N/A 09/17/2020   Procedure: ESOPHAGOGASTRODUODENOSCOPY (EGD) WITH PROPOFOL;  Surgeon: Lesly Rubenstein, MD;  Location: [facility] ENDOSCOPY;  Service: Endoscopy;  Laterality: N/A;   EXTRACORPOREAL SHOCK WAVE LITHOTRIPSY Right 11/01/2020   Procedure: EXTRACORPOREAL SHOCK WAVE LITHOTRIPSY (ESWL);  Surgeon: Abbie Sons, MD;  Location: [facility] ORS;  Service: Urology;  Laterality: Right;    There were no vitals filed for this visit.   Subjective Assessment - 05/06/21 1642     Subjective Patient reports he was very sore with pain in his low back for 4 days. States he has no pain upon arrival today. Has been doing some of his exercises and are going pretty well. Sitting down and standing up gets him out of breath and start his lower back pain. He states he hopes he doesn't have to have surgery. He states  he must go back to his doctor Wednesday because he rubbed his right eye and he feels something out of place in the corner of his right eye. He is going to get that chedked.    Pertinent History Patient is a 60 y.o. male who presents to outpatient physical therapy with a referral for medical diagnosis spinal stenosis of lumbar region with neurogenic claudication, lumbar degenerative disc disease. This patient's chief complaints consist of chronic constant B low back pain with intermittent B LE pain  to the calves leading to the following functional deficits: difficulty getting out of bed, walking, standing activities, household and community ambulation, shopping, bathing (must sit down), walking, transfers, lifting, bending, etc.  States "it affects everything really."   Relevant past medical history and comorbidities include HIV, TIA, schizophrenic (paranoid), Kaposi's sarcoma, HTN, leep apnea, CHF, CKD, Diabetes Mellitus type 2, GERD, Anemia, Arthritis, depression, occasionally symptomatic orthostatic dizziness, cardiac cath, lymphedema (left leg), NSTEMI, Choledocholithiasis (see chart for full PMH).    Patient denies hx of seizures, lung problem (but takes medication for pulmonary dysfunction), unexplained weight loss, changes in bowel or bladder problems, new onset stumbling or dropping things worsening with back pain, spinal surgery.    Limitations Lifting;Standing;Walking;House hold activities    How long can you stand comfortably? zero to a few seconds    Diagnostic tests Lumbar MRI report 04/02/2021: "IMPRESSION:  1. Mild progression of severe multifactorial spinal stenosis at  L3-4.  2. Unchanged moderate spinal stenosis and moderate to severe neural  foraminal stenosis at L4-5.  3. Unchanged mild spinal stenosis and moderate lateral recess  stenosis at L1-2."    Patient Stated Goals "see if he is able to walk again and do exercises at home" be able to stand up straight and not have pain; be able to do the things he used to    Currently in Pain? No/denies    Pain Onset More than a month ago             OBJECTIVE  SELF-REPORTED FUNCTION FOTO score: 43/100 (lumbar spine questionnaire - needed help to record answers from PT)   TREATMENT:   Denies sensitivity to latex   Therapeutic exercise: to centralize symptoms and improve ROM, strength, muscular endurance, and activity tolerance required for successful completion of functional activities.  - NuStep level 3 using bilateral lower  and upper extremities. Seat/handle setting 9. For improved extremity mobility, muscular endurance, and activity tolerance; and to induce the analgesic effect of aerobic exercise, stimulate improved joint nutrition, and prepare body structures and systems for following interventions. x 7 minutes. Pain in right low back slightly increasing with time. Average SPM = 58  Circuit: - Seated row with 10# cable at OMEGA machine, 3x10 - seated overhead press with 3# DBs, 3x10 (reports pain down back of legs to ankles)  Circuit: - seated lat pull down, 3x10 at 20# cable at [facility] machine - seated chest press, 3x10 at 5# cable at [facility] machine,   - seated hip flexor stretch in chair, 3x30 seconds each side. R side only feels in back and down back of R leg to calf.  - seated lumbar flexion with white physioball, 2-5 second hold, x2 min. Leg pain resolved.  - seated lumbar alternating diagonal flexion with white physioball, 2-5 second holds, 2 min.  - standing thoracic extension with hips flexed 90 degrees, arms on TM bar, allowing chest to drop towards floor. 1x6. pain down legs.  - seated thoracic extension with PT  overpressure, 1x9 (discontinued due to pain down legs).    Pt required multimodal cuing for proper technique and to facilitate improved neuromuscular control, strength, range of motion, and functional ability resulting in improved performance and form.   HOME EXERCISE PROGRAM Access Code: [email or web address] URL: [email or web address] Date: 04/22/2021 Prepared by: Rosita Kea   Exercises Sit to Stand Without Arm Support - 1 x daily - 3 sets - 10 reps Supine Lower Trunk Rotation - 1 x daily - 1 sets - 20 reps     PT Education - 05/06/21 1653     Education Details exercise/intervention purpose/form    Person(s) Educated Patient    Methods Explanation;Demonstration;Tactile cues;Verbal cues    Comprehension Verbalized understanding;Returned demonstration;Verbal cues required;Tactile  cues required              PT Short Term Goals - 04/30/21 1027       PT SHORT TERM GOAL #1   Title Be independent with initial home exercise program for self-management of symptoms.    Baseline initial HEP to be established visit 2 as appropriate (04/16/2021); HEP provided visit 2 (04/22/2021);    Time 2    Period Weeks    Status Achieved    Target Date 04/30/21               PT Long Term Goals - 04/16/21 1157       PT LONG TERM GOAL #1   Title Be independent with a long-term home exercise program for self-management of symptoms.    Baseline initial HEP to be established visit 2 as appropriate (04/16/2021);    Time 12    Period Weeks    Status New   TARGET DATE FOR ALL LONG TERM GOALS: 07/09/2021     PT LONG TERM GOAL #2   Title Demonstrate improved FOTO to equal or greater than 46 by visit #12 to demonstrate improvement in overall condition and self-reported functional ability.    Baseline 37 (04/16/2021);    Time 12    Period Weeks    Status New      PT LONG TERM GOAL #3   Title Patient will improve B hip extension to equal or greater than 20 degrees to improve upright standing posture and decrease leg and back pain during upright activities to improve functional mobility.    Baseline ~ 0 degrees bilaterally (04/16/2021);    Time 12    Period Weeks    Status New      PT LONG TERM GOAL #4   Title Patient will complete 5 Time Sit <> Stand Test in equal or less than 12 seconds with no UE support from 18.5 inch or less surface height to improve functional mobilty for transfers and ambulatoin.    Baseline not tested (04/16/2021);    Time 12    Period Weeks    Status New      PT LONG TERM GOAL #5   Title Patient will ambulate equal or greater than 1000 feet with LRAD and mod I during 6 Minute Walk Test to improve household and community mobility.    Baseline Not formally tested; looks for place to sit as soon as he arrives at location of 50-100 feet with no AD  (04/16/2021);    Time 12    Period Weeks                   Plan - 05/06/21 1729     Clinical Impression Statement Patient tolerated treatment with  some difficulty due to reports of pain in the right low back and then down bilateral posterior LE to heels with seated circuit exercises that lasted afterwards. Patient continues to have LE symptoms with what appears to be thoracic extension. Patient FOTO score improved slightly from 37 to 43. Patient reports slight increase in pain at right low back when sitting at end of session. Patient continues to be significantly limited in functional mobility by his back and leg pain. Patient would benefit from continued management of limiting condition by skilled physical therapist to address remaining impairments and functional limitations to work towards stated goals and return to PLOF or maximal functional independence.    Personal Factors and Comorbidities Age;Comorbidity 3+;Past/Current Experience;Fitness;Time since onset of injury/illness/exacerbation    Comorbidities Relevant past medical history and comorbidities include HIV, TIA, schizophrenic (paranoid), Kaposi's sarcoma, HTN, leep apnea, CHF, CKD, Diabetes Mellitus type 2, GERD, Anemia, Arthritis, depression, occasionally symptomatic orthostatic dizziness, cardiac cath, lymphedema (left leg), NSTEMI, Choledocholithiasis (see chart for full PMH).    Examination-Activity Limitations Bathing;Bed Mobility;Lift;Stairs;Squat;Bend;Stand;Locomotion Level;Reach Overhead;Carry;Transfers    Examination-Participation Restrictions Shop;Community Activity;Interpersonal Relationship   getting out of bed, walking, standing activities, household and community ambulation, shopping, bathing (must sit down), walking, transfers, lifting, bending, etc.  States "it affects everything really."   Stability/Clinical Decision Making Stable/Uncomplicated    Rehab Potential Fair    PT Frequency 2x / week    PT Duration 12  weeks    PT Treatment/Interventions ADLs/Self Care Home Management;Aquatic Therapy;Cryotherapy;Moist Heat;Traction;Electrical Stimulation;DME Instruction;Gait training;Functional mobility training;Balance training;Therapeutic exercise;Therapeutic activities;Neuromuscular re-education;Patient/family education;Manual techniques;Dry needling;Passive range of motion;Joint Manipulations;Spinal Manipulations;Energy conservation    PT Next Visit Plan interventions to improve hip extension, improve lumbar mobility, LE, core, functional, and postural strengthening as tolerated    PT Home Exercise Plan Medbridge Access Code: [email or web address]    Consulted and Agree with Plan of Care Patient             Patient will benefit from skilled therapeutic intervention in order to improve the following deficits and impairments:  Abnormal gait, Decreased knowledge of use of DME, Impaired sensation, Improper body mechanics, Pain, Decreased mobility, Increased muscle spasms, Postural dysfunction, Decreased activity tolerance, Decreased endurance, Decreased range of motion, Decreased strength, Hypomobility, Impaired perceived functional ability, Decreased balance, Difficulty walking, Impaired flexibility, Obesity  Visit Diagnosis: Chronic bilateral low back pain with bilateral sciatica  Stiffness of right hip, not elsewhere classified  Stiffness of left hip, not elsewhere classified  Difficulty in walking, not elsewhere classified  Muscle weakness (generalized)     Problem List Patient Active Problem List   Diagnosis Date Noted   Abdominal pain 07/30/2020   Choledocholithiasis 07/30/2020   Hypoglycemia 07/30/2020   Dehydration 07/30/2020   Hyperlipidemia 07/30/2020   Acute renal failure superimposed on stage 3a chronic kidney disease ([place]) 07/30/2020   Acute cholecystitis 07/29/2020   Sepsis ([place]) 08/15/2019   NSTEMI (non-ST elevated myocardial infarction) ([place]) 06/07/2018   Lymphedema 05/12/2018   Pleural  effusion    FUO (fever of unknown origin)    MGUS (monoclonal gammopathy of unknown significance)    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 Graylon Good, PT, DPT 05/06/21, 5:30 PM   [facility] [address], [place], [address] Phone: [phone]   Fax:  [phone]  Name: Danny Cook. MRN: [id number] Date of Birth: 1960-09-03

## 2021-05-07 ENCOUNTER — Encounter: Payer: Medicare Other | Admitting: Physical Therapy

## 2021-05-08 ENCOUNTER — Encounter: Payer: Medicare Other | Admitting: Physical Therapy

## 2021-05-09 ENCOUNTER — Encounter: Payer: Medicare Other | Admitting: Physical Therapy

## 2021-05-13 ENCOUNTER — Encounter: Payer: Medicare Other | Admitting: Physical Therapy

## 2021-05-14 ENCOUNTER — Encounter: Payer: Medicare Other | Admitting: Physical Therapy

## 2021-05-15 ENCOUNTER — Encounter: Payer: Medicare Other | Admitting: Physical Therapy

## 2021-05-20 ENCOUNTER — Encounter: Payer: Medicare Other | Admitting: Physical Therapy

## 2021-05-21 ENCOUNTER — Encounter: Payer: Medicare Other | Admitting: Physical Therapy

## 2021-05-22 ENCOUNTER — Encounter: Payer: Medicare Other | Admitting: Physical Therapy

## 2021-05-27 ENCOUNTER — Encounter: Payer: Medicare Other | Admitting: Physical Therapy

## 2021-05-28 ENCOUNTER — Encounter: Payer: Medicare Other | Admitting: Physical Therapy

## 2021-05-29 ENCOUNTER — Encounter: Payer: Medicare Other | Admitting: Physical Therapy

## 2021-05-30 ENCOUNTER — Encounter: Payer: Medicare Other | Admitting: Physical Therapy

## 2021-06-03 ENCOUNTER — Other Ambulatory Visit: Payer: Self-pay | Admitting: Neurosurgery

## 2021-06-03 DIAGNOSIS — Z01818 Encounter for other preprocedural examination: Secondary | ICD-10-CM

## 2021-07-01 ENCOUNTER — Inpatient Hospital Stay: Admission: RE | Admit: 2021-07-01 | Payer: Medicare Other | Source: Ambulatory Visit

## 2021-07-02 ENCOUNTER — Other Ambulatory Visit: Payer: Self-pay

## 2021-07-02 ENCOUNTER — Encounter
Admission: RE | Admit: 2021-07-02 | Discharge: 2021-07-02 | Disposition: A | Discharge status: Home / Self Care | Payer: Medicare Other | Source: Ambulatory Visit | Attending: Neurosurgery | Admitting: Neurosurgery

## 2021-07-02 DIAGNOSIS — Z01818 Encounter for other preprocedural examination: Secondary | ICD-10-CM | POA: Insufficient documentation

## 2021-07-02 DIAGNOSIS — I110 Hypertensive heart disease with heart failure: Secondary | ICD-10-CM | POA: Insufficient documentation

## 2021-07-02 DIAGNOSIS — I509 Heart failure, unspecified: Secondary | ICD-10-CM | POA: Diagnosis not present

## 2021-07-02 HISTORY — DX: Cerebral infarction, unspecified: I63.9

## 2021-07-02 HISTORY — DX: Acute myocardial infarction, unspecified: I21.9

## 2021-07-02 HISTORY — DX: Headache, unspecified: R51.9

## 2021-07-02 LAB — CBC
HCT: 39 % (ref 39.0–52.0)
Hemoglobin: 13.3 g/dL (ref 13.0–17.0)
MCH: 31.7 pg (ref 26.0–34.0)
MCHC: 34.1 g/dL (ref 30.0–36.0)
MCV: 92.9 fL (ref 80.0–100.0)
Platelets: 234 10*3/uL (ref 150–400)
RBC: 4.2 MIL/uL — ABNORMAL LOW (ref 4.22–5.81)
RDW: 13.2 % (ref 11.5–15.5)
WBC: 7.5 10*3/uL (ref 4.0–10.5)
nRBC: 0 % (ref 0.0–0.2)

## 2021-07-02 LAB — URINALYSIS, ROUTINE W REFLEX MICROSCOPIC
Bacteria, UA: NONE SEEN
Bilirubin Urine: NEGATIVE
Glucose, UA: 50 mg/dL — AB
Hgb urine dipstick: NEGATIVE
Ketones, ur: 5 mg/dL — AB
Leukocytes,Ua: NEGATIVE
Nitrite: NEGATIVE
Protein, ur: >=300 mg/dL — AB
Specific Gravity, Urine: 1.025 (ref 1.005–1.030)
Squamous Epithelial / HPF: NONE SEEN (ref 0–5)
pH: 5 (ref 5.0–8.0)

## 2021-07-02 LAB — BASIC METABOLIC PANEL
Anion gap: 11 (ref 5–15)
BUN: 15 mg/dL (ref 8–23)
CO2: 24 mmol/L (ref 22–32)
Calcium: 9.6 mg/dL (ref 8.9–10.3)
Chloride: 103 mmol/L (ref 98–111)
Creatinine, Ser: 1 mg/dL (ref 0.61–1.24)
GFR, Estimated: >60 mL/min (ref >60)
Glucose, Bld: 180 mg/dL — ABNORMAL HIGH (ref 70–99)
Potassium: 3.9 mmol/L (ref 3.5–5.1)
Sodium: 138 mmol/L (ref 135–145)

## 2021-07-02 LAB — PROTIME-INR
INR: 1.3 — ABNORMAL HIGH (ref 0.8–1.2)
Prothrombin Time: 15.7 seconds — ABNORMAL HIGH (ref 11.4–15.2)

## 2021-07-02 LAB — TYPE AND SCREEN
ABO/RH(D): O POS
Antibody Screen: NEGATIVE

## 2021-07-02 LAB — SURGICAL PCR SCREEN
MRSA, PCR: NEGATIVE
Staphylococcus aureus: NEGATIVE

## 2021-07-02 LAB — APTT: aPTT: 32 seconds (ref 24–36)

## 2021-07-02 NOTE — Patient Instructions (Addendum)
Your procedure is scheduled on: 07/15/21 - Monday Report to the Registration Desk on the 1st floor of the [facility]. To find out your arrival time, please call [phone] between 1PM - 3PM on: 07/12/21 - Friday  REMEMBER: Instructions that are not followed completely may result in serious medical risk, up to and including death; or upon the discretion of your surgeon and anesthesiologist your surgery may need to be rescheduled.  Do not eat food after midnight the night before surgery.  No gum chewing, lozengers or hard candies.  You may however, drink CLEAR liquids up to 2 hours before you are scheduled to arrive for your surgery. Do not drink anything within 2 hours of your scheduled arrival time.  Clear liquids include: - water  Type 1 and Type 2 diabetics should only drink water.  TAKE THESE MEDICATIONS THE MORNING OF SURGERY WITH A SIP OF WATER:  - benztropine (COGENTIN) 0.5 MG tablet - brimonidine-timolol (COMBIGAN) 0.2-0.5 % ophthalmic solution - diazepam (VALIUM) 5 MG tablet - docusate sodium (COLACE) 250 MG capsule - donepezil (ARICEPT) 10 MG tablet - FANAPT 12 MG TABS - fluticasone (FLOVENT HFA) 220 MCG/ACT inhaler - gabapentin (NEURONTIN) 600 MG tablet - glycopyrrolate (ROBINUL) 2 MG tablet - memantine (NAMENDA) 10 MG tablet - omeprazole (PRILOSEC) 40 MG capsule - tamsulosin (FLOMAX) 0.4 MG CAPS capsule - venlafaxine XR (EFFEXOR-XR) 75 MG 24 hr capsule - venlafaxine XR (EFFEXOR-XR) 37.5 MG 24 hr capsule - ziprasidone (GEODON) 40 MG capsule    - Insulin Degludec (TRESIBA FLEXTOUCH) 200 UNIT/ML SOPN - Do not take the morning of surgery.  - Do Not take metFORMIN (GLUCOPHAGE) 1000 MG tablet on 01/14, 01/15 and do not take the day of surgery.  Use albuterol (PROVENTIL HFA;VENTOLIN HFA) 108 (90 Base) MCG/ACT inhaler on the day of surgery and bring to the hospital.   Follow recommendations from Cardiologist, Pulmonologist or PCP regarding stopping Aspirin,  Coumadin, Plavix, Eliquis, Pradaxa, or Pletal. Stop taking aspirin 325 mg beginning 07/07/21, may resume taking 2 weeks after surgery beginning 07/29/21.  One week prior to surgery: Stop Anti-inflammatories (NSAIDS) such as Advil, Aleve, Ibuprofen, Motrin, Naproxen, Naprosyn and Aspirin based products such as Excedrin, Goodys Powder, BC Powder.  Stop ANY OVER THE COUNTER supplements until after surgery. Omega-3 Fatty stop taking beginning 07/07/21.  You may however, continue to take Tylenol if needed for pain up until the day of surgery.  No Alcohol for 24 hours before or after surgery.  No Smoking including e-cigarettes for 24 hours prior to surgery.  No chewable tobacco products for at least 6 hours prior to surgery.  No nicotine patches on the day of surgery.  Do not use any "recreational" drugs for at least a week prior to your surgery.  Please be advised that the combination of cocaine and anesthesia may have negative outcomes, up to and including death. If you test positive for cocaine, your surgery will be cancelled.  On the morning of surgery brush your teeth with toothpaste and water, you may rinse your mouth with mouthwash if you wish. Do not swallow any toothpaste or mouthwash.  Use CHG Soap or wipes as directed on instruction sheet.  Do not wear jewelry, make-up, hairpins, clips or nail polish.  Do not wear lotions, powders, or perfumes.   Do not shave body from the neck down 48 hours prior to surgery just in case you cut yourself which could leave a site for infection.  Also, freshly shaved skin may become irritated if  using the CHG soap.  Contact lenses, hearing aids and dentures may not be worn into surgery.  Do not bring valuables to the hospital. [facility] is not responsible for any missing/lost belongings or valuables.   Bring your C-PAP to the hospital with you in case you may have to spend the night.   Notify your doctor if there is any change in your  medical condition (cold, fever, infection).  Wear comfortable clothing (specific to your surgery type) to the hospital.  After surgery, you can help prevent lung complications by doing breathing exercises.  Take deep breaths and cough every 1-2 hours. Your doctor may order a device called an Incentive Spirometer to help you take deep breaths. When coughing or sneezing, hold a pillow firmly against your incision with both hands. This is called splinting. Doing this helps protect your incision. It also decreases belly discomfort.  If you are being admitted to the hospital overnight, leave your suitcase in the car. After surgery it may be brought to your room.  If you are being discharged the day of surgery, you will not be allowed to drive home. You will need a responsible adult (18 years or older) to drive you home and stay with you that night.   If you are taking public transportation, you will need to have a responsible adult (18 years or older) with you. Please confirm with your physician that it is acceptable to use public transportation.   Please call the [facility]. at [phone] if you have any questions about these instructions.  Surgery Visitation Policy:  Patients undergoing a surgery or procedure may have one family member or support person with them as long as that person is not COVID-19 positive or experiencing its symptoms.  That person may remain in the waiting area during the procedure and may rotate out with other people.  Inpatient Visitation:    Visiting hours are 7 a.m. to 8 p.m. Up to two visitors ages 16+ are allowed at one time in a patient room. The visitors may rotate out with other people during the day. Visitors must check out when they leave, or other visitors will not be allowed. One designated support person may remain overnight. The visitor must pass COVID-19 screenings, use hand sanitizer when entering and exiting the patients room  and wear a mask at all times, including in the patients room. Patients must also wear a mask when staff or their visitor are in the room. Masking is required regardless of vaccination status.

## 2021-07-15 ENCOUNTER — Encounter: Payer: Self-pay | Admitting: Neurosurgery

## 2021-07-15 ENCOUNTER — Ambulatory Visit: Payer: Medicare Other | Admitting: Urgent Care

## 2021-07-15 ENCOUNTER — Encounter
Admission: RE | Disposition: A | Discharge status: Home / Self Care | Payer: Self-pay | Source: Home / Self Care | Attending: Neurosurgery

## 2021-07-15 ENCOUNTER — Ambulatory Visit
Admission: RE | Admit: 2021-07-15 | Discharge: 2021-07-15 | Disposition: A | Discharge status: Home / Self Care | Payer: Medicare Other | Attending: Neurosurgery | Admitting: Neurosurgery

## 2021-07-15 ENCOUNTER — Ambulatory Visit: Payer: Medicare Other

## 2021-07-15 ENCOUNTER — Ambulatory Visit: Payer: Medicare Other | Admitting: Anesthesiology

## 2021-07-15 ENCOUNTER — Other Ambulatory Visit: Payer: Self-pay

## 2021-07-15 DIAGNOSIS — E1142 Type 2 diabetes mellitus with diabetic polyneuropathy: Secondary | ICD-10-CM | POA: Diagnosis not present

## 2021-07-15 DIAGNOSIS — F0390 Unspecified dementia without behavioral disturbance: Secondary | ICD-10-CM | POA: Diagnosis not present

## 2021-07-15 DIAGNOSIS — G4730 Sleep apnea, unspecified: Secondary | ICD-10-CM | POA: Diagnosis not present

## 2021-07-15 DIAGNOSIS — Z8673 Personal history of transient ischemic attack (TIA), and cerebral infarction without residual deficits: Secondary | ICD-10-CM | POA: Insufficient documentation

## 2021-07-15 DIAGNOSIS — Z21 Asymptomatic human immunodeficiency virus [HIV] infection status: Secondary | ICD-10-CM | POA: Insufficient documentation

## 2021-07-15 DIAGNOSIS — I509 Heart failure, unspecified: Secondary | ICD-10-CM | POA: Insufficient documentation

## 2021-07-15 DIAGNOSIS — Z01818 Encounter for other preprocedural examination: Secondary | ICD-10-CM

## 2021-07-15 DIAGNOSIS — I2510 Atherosclerotic heart disease of native coronary artery without angina pectoris: Secondary | ICD-10-CM | POA: Diagnosis not present

## 2021-07-15 DIAGNOSIS — F418 Other specified anxiety disorders: Secondary | ICD-10-CM | POA: Insufficient documentation

## 2021-07-15 DIAGNOSIS — I110 Hypertensive heart disease with heart failure: Secondary | ICD-10-CM | POA: Diagnosis not present

## 2021-07-15 DIAGNOSIS — Z419 Encounter for procedure for purposes other than remedying health state, unspecified: Secondary | ICD-10-CM

## 2021-07-15 DIAGNOSIS — M48062 Spinal stenosis, lumbar region with neurogenic claudication: Secondary | ICD-10-CM | POA: Insufficient documentation

## 2021-07-15 HISTORY — PX: LUMBAR LAMINECTOMY/DECOMPRESSION MICRODISCECTOMY: SHX5026

## 2021-07-15 LAB — GLUCOSE, CAPILLARY: Glucose-Capillary: 118 mg/dL — ABNORMAL HIGH (ref 70–99)

## 2021-07-15 LAB — POCT I-STAT, CHEM 8
BUN: 10 mg/dL (ref 8–23)
Calcium, Ion: 1.16 mmol/L (ref 1.15–1.40)
Chloride: 103 mmol/L (ref 98–111)
Creatinine, Ser: 1.1 mg/dL (ref 0.61–1.24)
Glucose, Bld: 122 mg/dL — ABNORMAL HIGH (ref 70–99)
HCT: 39 % (ref 39.0–52.0)
Hemoglobin: 13.3 g/dL (ref 13.0–17.0)
Potassium: 4.2 mmol/L (ref 3.5–5.1)
Sodium: 141 mmol/L (ref 135–145)
TCO2: 29 mmol/L (ref 22–32)

## 2021-07-15 LAB — ABO/RH: ABO/RH(D): O POS

## 2021-07-15 SURGERY — LUMBAR LAMINECTOMY/DECOMPRESSION MICRODISCECTOMY 2 LEVELS
Anesthesia: General | Site: Spine Lumbar

## 2021-07-15 MED ORDER — REMIFENTANIL HCL 1 MG IV SOLR
INTRAVENOUS | Status: AC
  Filled 2021-07-15: qty 1000

## 2021-07-15 MED ORDER — ONDANSETRON HCL 4 MG/2ML IJ SOLN
INTRAMUSCULAR | Status: DC | PRN
  Administered 2021-07-15: 4 mg via INTRAVENOUS

## 2021-07-15 MED ORDER — BUPIVACAINE-EPINEPHRINE (PF) 0.5% -1:200000 IJ SOLN
INTRAMUSCULAR | Status: AC
  Filled 2021-07-15: qty 30

## 2021-07-15 MED ORDER — VASOPRESSIN 20 UNIT/ML IV SOLN
INTRAVENOUS | Status: DC | PRN
Start: 2021-07-15 — End: 2021-07-15
  Administered 2021-07-15 (×2): .5 [IU] via INTRAVENOUS

## 2021-07-15 MED ORDER — GLYCOPYRROLATE 0.2 MG/ML IJ SOLN
INTRAMUSCULAR | Status: DC | PRN
Start: 2021-07-15 — End: 2021-07-15
  Administered 2021-07-15: .2 mg via INTRAVENOUS

## 2021-07-15 MED ORDER — MIDAZOLAM HCL 2 MG/2ML IJ SOLN
INTRAMUSCULAR | Status: AC
  Filled 2021-07-15: qty 2

## 2021-07-15 MED ORDER — SURGIFLO WITH THROMBIN (HEMOSTATIC MATRIX KIT) OPTIME
TOPICAL | Status: DC | PRN
  Administered 2021-07-15: 1 via TOPICAL

## 2021-07-15 MED ORDER — ONDANSETRON HCL 4 MG/2ML IJ SOLN: 4.0000 mg | Freq: Once | INTRAMUSCULAR | Status: DC | PRN

## 2021-07-15 MED ORDER — METHYLPREDNISOLONE ACETATE 40 MG/ML IJ SUSP
INTRAMUSCULAR | Status: DC | PRN
  Administered 2021-07-15: 40 mg

## 2021-07-15 MED ORDER — OXYCODONE HCL 5 MG PO TABS: 5.0000 mg | ORAL_TABLET | Freq: Once | ORAL | Status: AC | PRN

## 2021-07-15 MED ORDER — REMIFENTANIL HCL 1 MG IV SOLR
INTRAVENOUS | Status: DC | PRN
  Administered 2021-07-15: .1 ug/kg/min via INTRAVENOUS

## 2021-07-15 MED ORDER — METHYLPREDNISOLONE ACETATE 40 MG/ML IJ SUSP
INTRAMUSCULAR | Status: AC
  Filled 2021-07-15: qty 1

## 2021-07-15 MED ORDER — FIBRIN SEALANT 2 ML SINGLE DOSE KIT
PACK | CUTANEOUS | Status: DC | PRN
  Administered 2021-07-15: 2 mL via TOPICAL

## 2021-07-15 MED ORDER — ACETAMINOPHEN 10 MG/ML IV SOLN
INTRAVENOUS | Status: DC | PRN
  Administered 2021-07-15: 1000 mg via INTRAVENOUS

## 2021-07-15 MED ORDER — ACETAMINOPHEN 10 MG/ML IV SOLN
INTRAVENOUS | Status: AC
  Filled 2021-07-15: qty 100

## 2021-07-15 MED ORDER — LIDOCAINE HCL (CARDIAC) PF 100 MG/5ML IV SOSY
PREFILLED_SYRINGE | INTRAVENOUS | Status: DC | PRN
  Administered 2021-07-15: 100 mg via INTRAVENOUS

## 2021-07-15 MED ORDER — SODIUM CHLORIDE (PF) 0.9 % IJ SOLN
INTRAMUSCULAR | Status: DC | PRN
  Administered 2021-07-15: 60 mL via INTRAMUSCULAR

## 2021-07-15 MED ORDER — DEXAMETHASONE SODIUM PHOSPHATE 10 MG/ML IJ SOLN
INTRAMUSCULAR | Status: DC | PRN
  Administered 2021-07-15: 5 mg via INTRAVENOUS

## 2021-07-15 MED ORDER — SODIUM CHLORIDE FLUSH 0.9 % IV SOLN
INTRAVENOUS | Status: AC
  Filled 2021-07-15: qty 20

## 2021-07-15 MED ORDER — 0.9 % SODIUM CHLORIDE (POUR BTL) OPTIME
TOPICAL | Status: DC | PRN
  Administered 2021-07-15: 500 mL

## 2021-07-15 MED ORDER — LACTATED RINGERS IV SOLN
INTRAVENOUS | Status: DC | PRN
Start: 2021-07-15 — End: 2021-07-15

## 2021-07-15 MED ORDER — PHENYLEPHRINE HCL (PRESSORS) 10 MG/ML IV SOLN
INTRAVENOUS | Status: DC | PRN
  Administered 2021-07-15 (×2): 160 ug via INTRAVENOUS

## 2021-07-15 MED ORDER — CHLORHEXIDINE GLUCONATE 0.12 % MT SOLN
OROMUCOSAL | Status: AC
  Administered 2021-07-15: 15 mL via OROMUCOSAL
  Filled 2021-07-15: qty 15

## 2021-07-15 MED ORDER — CEFAZOLIN SODIUM-DEXTROSE 2-4 GM/100ML-% IV SOLN
INTRAVENOUS | Status: AC
  Filled 2021-07-15: qty 100

## 2021-07-15 MED ORDER — FENTANYL CITRATE (PF) 100 MCG/2ML IJ SOLN
INTRAMUSCULAR | Status: AC
  Filled 2021-07-15: qty 2

## 2021-07-15 MED ORDER — CEFAZOLIN SODIUM-DEXTROSE 2-4 GM/100ML-% IV SOLN
2.0000 g | INTRAVENOUS | Status: AC
Start: 2021-07-15 — End: 2021-07-15
  Administered 2021-07-15: 2 g via INTRAVENOUS

## 2021-07-15 MED ORDER — CHLORHEXIDINE GLUCONATE 0.12 % MT SOLN: 15.0000 mL | Freq: Once | OROMUCOSAL | Status: AC

## 2021-07-15 MED ORDER — MIDAZOLAM HCL 2 MG/2ML IJ SOLN
INTRAMUSCULAR | Status: DC | PRN
  Administered 2021-07-15: 2 mg via INTRAVENOUS

## 2021-07-15 MED ORDER — PHENYLEPHRINE HCL-NACL 20-0.9 MG/250ML-% IV SOLN
INTRAVENOUS | Status: DC | PRN
  Administered 2021-07-15: 50 ug/min via INTRAVENOUS

## 2021-07-15 MED ORDER — SODIUM CHLORIDE 0.9 % IV SOLN: INTRAVENOUS | Status: DC

## 2021-07-15 MED ORDER — METHOCARBAMOL 500 MG PO TABS: 500.0000 mg | ORAL_TABLET | Freq: Four times a day (QID) | ORAL | 0 refills | Status: DC

## 2021-07-15 MED ORDER — ACETAMINOPHEN 10 MG/ML IV SOLN: 1000.0000 mg | Freq: Once | INTRAVENOUS | Status: DC | PRN

## 2021-07-15 MED ORDER — PHENYLEPHRINE HCL-NACL 20-0.9 MG/250ML-% IV SOLN
INTRAVENOUS | Status: AC
  Filled 2021-07-15: qty 250

## 2021-07-15 MED ORDER — SUCCINYLCHOLINE CHLORIDE 200 MG/10ML IV SOSY
PREFILLED_SYRINGE | INTRAVENOUS | Status: DC | PRN
  Administered 2021-07-15: 100 mg via INTRAVENOUS

## 2021-07-15 MED ORDER — EPHEDRINE SULFATE 50 MG/ML IJ SOLN
INTRAMUSCULAR | Status: DC | PRN
  Administered 2021-07-15 (×2): 10 mg via INTRAVENOUS

## 2021-07-15 MED ORDER — ORAL CARE MOUTH RINSE: 15.0000 mL | Freq: Once | OROMUCOSAL | Status: AC

## 2021-07-15 MED ORDER — LACTATED RINGERS IV SOLN: INTRAVENOUS | Status: DC

## 2021-07-15 MED ORDER — FENTANYL CITRATE (PF) 100 MCG/2ML IJ SOLN
INTRAMUSCULAR | Status: DC | PRN
  Administered 2021-07-15 (×2): 50 ug via INTRAVENOUS

## 2021-07-15 MED ORDER — BUPIVACAINE-EPINEPHRINE (PF) 0.5% -1:200000 IJ SOLN
INTRAMUSCULAR | Status: DC | PRN
  Administered 2021-07-15: 5.5 mL

## 2021-07-15 MED ORDER — OXYCODONE HCL 5 MG PO TABS
ORAL_TABLET | ORAL | Status: AC
  Administered 2021-07-15: 5 mg via ORAL
  Filled 2021-07-15: qty 1

## 2021-07-15 MED ORDER — PROPOFOL 10 MG/ML IV BOLUS
INTRAVENOUS | Status: AC
  Filled 2021-07-15: qty 40

## 2021-07-15 MED ORDER — BUPIVACAINE LIPOSOME 1.3 % IJ SUSP
INTRAMUSCULAR | Status: AC
  Filled 2021-07-15: qty 20

## 2021-07-15 MED ORDER — FENTANYL CITRATE (PF) 100 MCG/2ML IJ SOLN: 25.0000 ug | INTRAMUSCULAR | Status: DC | PRN

## 2021-07-15 MED ORDER — BUPIVACAINE HCL (PF) 0.5 % IJ SOLN
INTRAMUSCULAR | Status: AC
  Filled 2021-07-15: qty 30

## 2021-07-15 MED ORDER — OXYCODONE HCL 5 MG PO TABS: 5.0000 mg | ORAL_TABLET | ORAL | 0 refills | Status: AC | PRN

## 2021-07-15 MED ORDER — PROPOFOL 10 MG/ML IV BOLUS
INTRAVENOUS | Status: DC | PRN
Start: 2021-07-15 — End: 2021-07-15
  Administered 2021-07-15: 150 mg via INTRAVENOUS

## 2021-07-15 MED ORDER — OXYCODONE HCL 5 MG/5ML PO SOLN: 5.0000 mg | Freq: Once | ORAL | Status: AC | PRN

## 2021-07-15 SURGICAL SUPPLY — 58 items
ADH SKN CLS APL DERMABOND .7 (GAUZE/BANDAGES/DRESSINGS) ×1
AGENT HMST KT MTR STRL THRMB (HEMOSTASIS) ×1
APL PRP STRL LF DISP 70% ISPRP (MISCELLANEOUS) ×2
BUR NEURO DRILL SOFT 3.0X3.8M (BURR) ×3 IMPLANT
CHLORAPREP W/TINT 26 (MISCELLANEOUS) ×4 IMPLANT
CNTNR SPEC 2.5X3XGRAD LEK (MISCELLANEOUS) ×1
CONT SPEC 4OZ STER OR WHT (MISCELLANEOUS) ×1
CONT SPEC 4OZ STRL OR WHT (MISCELLANEOUS) ×1
CONTAINER SPEC 2.5X3XGRAD LEK (MISCELLANEOUS) ×2 IMPLANT
COUNTER NEEDLE 20/40 LG (NEEDLE) ×3 IMPLANT
CUP MEDICINE 2OZ PLAST GRAD ST (MISCELLANEOUS) ×3 IMPLANT
DERMABOND ADVANCED (GAUZE/BANDAGES/DRESSINGS) ×1
DERMABOND ADVANCED .7 DNX12 (GAUZE/BANDAGES/DRESSINGS) ×2 IMPLANT
DRAPE C ARM PK CFD 31 SPINE (DRAPES) ×3 IMPLANT
DRAPE LAPAROTOMY 100X77 ABD (DRAPES) ×3 IMPLANT
DRAPE MICROSCOPE SPINE 48X150 (DRAPES) ×3 IMPLANT
DRAPE SURG 17X11 SM STRL (DRAPES) ×12 IMPLANT
DRSG OPSITE POSTOP 3X4 (GAUZE/BANDAGES/DRESSINGS) ×1 IMPLANT
DRSG OPSITE POSTOP 4X6 (GAUZE/BANDAGES/DRESSINGS) ×1 IMPLANT
ELECT CAUTERY BLADE TIP 2.5 (TIP) ×2
ELECT EZSTD 165MM 6.5IN (MISCELLANEOUS) ×2
ELECT REM PT RETURN 9FT ADLT (ELECTROSURGICAL) ×2
ELECTRODE CAUTERY BLDE TIP 2.5 (TIP) ×2 IMPLANT
ELECTRODE EZSTD 165MM 6.5IN (MISCELLANEOUS) ×2 IMPLANT
ELECTRODE REM PT RTRN 9FT ADLT (ELECTROSURGICAL) ×2 IMPLANT
GAUZE 4X4 16PLY ~~LOC~~+RFID DBL (SPONGE) ×3 IMPLANT
GAUZE XEROFORM 1X8 LF (GAUZE/BANDAGES/DRESSINGS) ×1 IMPLANT
GLOVE SURG SYN 6.5 ES PF (GLOVE) ×2 IMPLANT
GLOVE SURG SYN 6.5 PF PI (GLOVE) ×2 IMPLANT
GLOVE SURG SYN 8.5  E (GLOVE) ×6
GLOVE SURG SYN 8.5 E (GLOVE) ×3 IMPLANT
GLOVE SURG SYN 8.5 PF PI (GLOVE) ×6 IMPLANT
GLOVE SURG UNDER POLY LF SZ6.5 (GLOVE) ×3 IMPLANT
GOWN SRG LRG LVL 4 IMPRV REINF (GOWNS) ×2 IMPLANT
GOWN SRG XL LVL 3 NONREINFORCE (GOWNS) ×2 IMPLANT
GOWN STRL NON-REIN TWL XL LVL3 (GOWNS) ×2
GOWN STRL REIN LRG LVL4 (GOWNS) ×2
GRADUATE 1200CC STRL 31836 (MISCELLANEOUS) ×3 IMPLANT
GRAFT DURAGEN MATRIX 1WX1L (Tissue) ×1 IMPLANT
KIT SPINAL PRONEVIEW (KITS) ×3 IMPLANT
MANIFOLD NEPTUNE II (INSTRUMENTS) ×3 IMPLANT
MARKER SKIN DUAL TIP RULER LAB (MISCELLANEOUS) ×6 IMPLANT
NDL SAFETY ECLIPSE 18X1.5 (NEEDLE) ×2 IMPLANT
NEEDLE HYPO 18GX1.5 SHARP (NEEDLE) ×2
NEEDLE HYPO 22GX1.5 SAFETY (NEEDLE) ×3 IMPLANT
NS IRRIG 500ML POUR BTL (IV SOLUTION) ×1 IMPLANT
PACK LAMINECTOMY NEURO (CUSTOM PROCEDURE TRAY) ×3 IMPLANT
SURGIFLO W/THROMBIN 8M KIT (HEMOSTASIS) ×3 IMPLANT
SUT DVC VLOC 3-0 CL 6 P-12 (SUTURE) ×3 IMPLANT
SUT ETHILON 3-0 FS-10 30 BLK (SUTURE) ×2
SUT VIC AB 0 CT1 27 (SUTURE) ×2
SUT VIC AB 0 CT1 27XCR 8 STRN (SUTURE) ×2 IMPLANT
SUT VIC AB 2-0 CT1 18 (SUTURE) ×3 IMPLANT
SUTURE EHLN 3-0 FS-10 30 BLK (SUTURE) IMPLANT
SYR 30ML LL (SYRINGE) ×6 IMPLANT
SYR 3ML LL SCALE MARK (SYRINGE) ×3 IMPLANT
TOWEL OR 17X26 4PK STRL BLUE (TOWEL DISPOSABLE) ×9 IMPLANT
TUBING CONNECTING 10 (TUBING) ×3 IMPLANT

## 2021-07-15 NOTE — Discharge Instructions (Addendum)
AMBULATORY SURGERY  DISCHARGE INSTRUCTIONS   The drugs that you were given will stay in your system until tomorrow so for the next 24 hours you should not:  Drive an automobile Make any legal decisions Drink any alcoholic beverage   You may resume regular meals tomorrow.  Today it is better to start with liquids and gradually work up to solid foods.  You may eat anything you prefer, but it is better to start with liquids, then soup and crackers, and gradually work up to solid foods.   Please notify your doctor immediately if you have any unusual bleeding, trouble breathing, redness and pain at the surgery site, drainage, fever, or pain not relieved by medication.    Additional Instructions: Information for Discharge Teaching:  DO NOT REMOVE TEAL BRACELET FOR 4 Days (96 hours) EXPAREL (bupivacaine liposome injectable suspension)   Your surgeon or anesthesiologist gave you EXPAREL(bupivacaine) to help control your pain after surgery.  EXPAREL is a local anesthetic that provides pain relief by numbing the tissue around the surgical site. EXPAREL is designed to release pain medication over time and can control pain for up to 72 hours. Depending on how you respond to EXPAREL, you may require less pain medication during your recovery.  Possible side effects: Temporary loss of sensation or ability to move in the area where bupivacaine was injected. Nausea, vomiting, constipation Rarely, numbness and tingling in your mouth or lips, lightheadedness, or anxiety may occur. Call your doctor right away if you think you may be experiencing any of these sensations, or if you have other questions regarding possible side effects.  Follow all other discharge instructions given to you by your surgeon or nurse. Eat a healthy diet and drink plenty of water or other fluids.  If you return to the hospital for any reason within 96 hours following the administration of EXPAREL, it is important for  health care providers to know that you have received this anesthetic. A teal colored band has been placed on your arm with the date, time and amount of EXPAREL you have received in order to alert and inform your health care providers. Please leave this armband in place for the full 96 hours following administration, and then you may remove the band.        Please contact your physician with any problems or Same Day Surgery at [phone], Monday through Friday 6 am to 4 pm, or [place] [facility] number at [phone].   Your surgeon has performed an operation on your lumbar spine (low back) to relieve pressure on one or more nerves. Many times, patients feel better immediately after surgery and can overdo it. Even if you feel well, it is important that you follow these activity guidelines. If you do not let your back heal properly from the surgery, you can increase the chance of a disc herniation and/or return of your symptoms. The following are instructions to help in your recovery once you have been discharged from the hospital.  * Do not take anti-inflammatory medications for 3 days after surgery (naproxen [Aleve], ibuprofen [Advil, Motrin], celecoxib [Celebrex], etc.) *Hold home Aspirin for 7 days post-op  Activity    No bending, lifting, or twisting (BLT). Avoid lifting objects heavier than 10 pounds (gallon milk jug).  Where possible, avoid household activities that involve lifting, bending, pushing, or pulling such as laundry, vacuuming, grocery shopping, and childcare. Try to arrange for help from friends and family for these activities while your back heals.  Increase physical activity slowly as tolerated.  Taking short walks is encouraged, but avoid strenuous exercise. Do not jog, run, bicycle, lift weights, or participate in any other exercises unless specifically allowed by your doctor. Avoid prolonged sitting, including car rides.  Talk to your doctor before resuming  sexual activity.  You should not drive until cleared by your doctor.  Until released by your doctor, you should not return to work or school.  You should rest at home and let your body heal.   You may shower two days after your surgery.  After showering, lightly dab your incision dry. Do not take a tub bath or go swimming for 3 weeks, or until approved by your doctor at your follow-up appointment.  If you smoke, we strongly recommend that you quit.  Smoking has been proven to interfere with normal healing in your back and will dramatically reduce the success rate of your surgery. Please contact [facility] (800-QUIT-NOW) and use the resources at www.[facility].com for assistance in stopping smoking.  Surgical Incision   If you have a dressing on your incision, you may remove it two days after your surgery. Keep your incision area clean and dry.  Your incision was closed with Dermabond glue. The glue should begin to peel away within about a week  Diet            You may return to your usual diet. Be sure to stay hydrated.  When to Contact us  Although your surgery and recovery will likely be uneventful, you may have some residual numbness, aches, and pains in your back and/or legs. This is normal and should improve in the next few weeks.  However, should you experience any of the following, contact us immediately: New numbness or weakness Pain that is progressively getting worse, and is not relieved by your pain medications or rest Bleeding, redness, swelling, pain, or drainage from surgical incision Chills or flu-like symptoms Fever greater than 101.0 F (38.3 C) Problems with bowel or bladder functions Difficulty breathing or shortness of breath Warmth, tenderness, or swelling in your calf  Contact Information During office hours (Monday-Friday 9 am to 5 pm), please call your physician at [phone] After hours and weekends, please call [phone] and speak with the answering  service, who will contact the doctor on call.  If that fails, call the [place] Operator at [phone] and ask for the Neurosurgery Resident On Call  For a life-threatening emergency, call 911

## 2021-07-15 NOTE — Progress Notes (Signed)
PHARMACY -  BRIEF ANTIBIOTIC NOTE   Pharmacy has received consult(s) for Cefazolin from an OR provider.  The patient's profile has been reviewed for ht/wt/allergies/indication/available labs.    One time order(s) placed for Cefazolin 2 gm per pt wt: 94.3 kg.  Further antibiotics/pharmacy consults should be ordered by admitting physician if indicated.                       Thank you, Renda Rolls, PharmD, [facility] 07/15/2021 6:17 AM

## 2021-07-15 NOTE — H&P (Signed)
History of Present Illness: 07/15/2021 Danny Cook presents today with continued symptoms.  05/29/2021 Danny Cook is here today with a chief complaint of low back pain with radiation into the bilateral legs and stopping at the calves. He has been having trouble for several years. I previously saw him in 2020 and recommended surgery. He decided not to proceed due to anxiety about the surgery. He is now having trouble to where he cannot walk or stand for more than 5 minutes. Sitting and resting make it better. It is constant and has been impacting his quality of life severely.  He did try physical therapy again, but was unable to make any improvements with his first 3 visits.  Conservative measures:  Physical therapy: has participated in at [facility] clinic  Multimodal medical therapy including regular antiinflammatories: gabapentin, tylenol, meloxicam Injections: has had epidural steroid injections 09/10/20: Bilateral S1 ESI 01/31/2020: Bilateral S1 transforaminal (moderate to good relief) 12/08/2019: Bilateral S1 ESI (moderate relief) 11/17/2019: Bilateral S1 ESI (good relief x5 days) 05/04/2019: Bilateral L5-S1 ESI (good relief for approximately 4 hours) 02/22/2019: Bilateral L5-S1 ESI (good relief x4 hours) 01/14/2019: Bilateral S1 ESI (little relief for approximately 4 hours) 02/08/2016: Left S1 ESI (good relief) 09/21/2015: Left L5-S1 ESI (relief for 1-2 days) 08/30/2015: Left L5-S1 ESI 11/08/2014: Right L5-S1 ESI (good relief 2 weeks) 09/15/2014: Right L4-5 facet joint injection  Past Surgery: none  Danny Cook has no symptoms of cervical myelopathy.  Progress Note from Danny Cook, Danny Cook on 02/21/2021: Chief Complaint: New low back pain with radiating pain down bilateral lower extremities posteriorly into his calves.   LOV: 06/06/19 POC: Patient was offered L3-5 decompression but opted to continue with conservative management. Cardiologist: Dr. Saralyn Pilar   History of  Present Illness: Danny Cook is a 61 y.o. male with a past medical history of HIV, OSA, diabetes, schizoaffective schizophrenia on Depakote, history of strokes on 325 mg of aspirin, hypertension, dementia who presents with the chief complaint of continued low back pain with pain that radiates down the posterior aspects of both legs into his calves bilaterally. He states that the pain is equal between his right and left leg and occurs typically with ambulating for more than a minute or so. He states overall his symptoms have not changed significantly since his visit in 2020 and worse location, they have increased in severity and he is now interested in considering conservative management. He has had epidural steroid injections and physical therapy extensively in the past without any significant improvement of symptoms. Of note Danny Cook does reside in a family care home but reports that he is independent of his activities of daily living.  The symptoms are causing a significant impact on the patient's life.   06/16/2019  Danny Cook had some questions regarding his surgery. This was a telephone call. No examination was performed.     06/09/2019 Danny Cook is here today with a chief complaint of bilateral low back pain with radiation to the bilateral buttock extending to the posterior thighs and calves.   He describes years of worsening problems, though he has had particular decline in ability to walk without pain over the past year. He is now unable to walk more than 100 to 200 feet without discomfort in the back of his thighs. He can only stand about 10 minutes before having to sit down. When he sits and lays down his pain goes away. He denies weakness or bowel or bladder dysfunction. He describes throbbing and aching  pain into the backs of his legs that substantially hamper his quality of life. All of his discomfort is on the back of his legs.       Conservative measures:  Physical  therapy: has participated in 6 visits at [facility] 01/2019-03/2019 - made pain worse Multimodal medical therapy including regular antiinflammatories: gabapentin, tylenol, meloxicam Injections: has tried epidural steroid injections 05/04/2019: Bilateral L5-S1 transforaminal ESI (a little bit of relief for approximately 1.5 hours ) 02/22/2019: Bilateral L5-S1 transforaminal ESI (good relief x4 hours) 01/14/2019: Bilateral S1 transforaminal ESI (little relief for approximately 4 hours) 02/08/2016: Left S1 transforaminal ESI (good relief) 09/21/2015: Left L5-S1 transforaminal ESI (relief for 1-2 days) 08/30/2015: Left L5-S1 transforaminal ESI 11/08/2014: Right L5-S1 transforaminal ESI (good relief 2 weeks) 09/15/2014: Right L4-5 facet joint injection   Past Surgery: denies   Danny Cook has no symptoms of cervical myelopathy.   The symptoms are causing a significant impact on the patient's life.   Review of Systems:  A 10 point review of systems is negative, except for the pertinent positives and negatives detailed in the HPI.  Past Medical History: Past Medical History:  Diagnosis Date   Anemia   Anxiety   Asymptomatic human immunodeficiency virus (HIV) infection status (CMS-HCC)   Colitis [id number]   Colon polyp   Depression   Diabetes mellitus type 2, uncomplicated (CMS-HCC)   Gastritis 07/01/2016   HIV (human immunodeficiency virus infection) (CMS-HCC)  Since 1990s, no OI's.   Hyperlipidemia   Hypertension   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CMS-HCC)  resolved spontaneously   Obesity   Paranoid disorder (CMS-HCC)   Peripheral neuropathy   Renal insufficiency syndrome  1. 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   Schizo affective schizophrenia (CMS-HCC)   Sleep apnea   Past Surgical History: Past Surgical History:  Procedure Laterality Date   COLONOSCOPY 09/27/2009   COLONOSCOPY 09/03/2015  Focal active Colitis/Repeat 53yrs/MUS   COLONOSCOPY 09/17/2020  Normal  colon/Repeat 41yrs/CTL   EGD 07/01/2016  Gastritis/Negative for H. Pylori/No Repeat/MUS   EGD 09/17/2020  Gastritis/Repeat as needed/CTL   EXTRACTION TEETH    Allergies  Allergen Reactions   Trazodone And Nefazodone     Affects other meds that they dont work   [employer] Disoproxil]     Damages kidneys   Etodolac Rash    Current Meds  Medication Sig   abacavir-dolutegravir-lamiVUDine (TRIUMEQ) 600-50-300 MG tablet Take 1 tablet by mouth at bedtime.   acetaminophen (TYLENOL) 650 MG CR tablet Take 650 mg by mouth every 8 (eight) hours as needed for pain.   albuterol (PROVENTIL HFA;VENTOLIN HFA) 108 (90 Base) MCG/ACT inhaler Inhale 2 puffs into the lungs every 6 (six) hours as needed for wheezing or shortness of breath.   aspirin EC 325 MG tablet Take 325 mg by mouth daily.   benztropine (COGENTIN) 0.5 MG tablet Take 1.5 mg by mouth daily.   bismuth subsalicylate (PEPTO BISMOL) 262 MG/15ML suspension Take 15 mLs by mouth every 6 (six) hours as needed for indigestion.   brimonidine-timolol (COMBIGAN) 0.2-0.5 % ophthalmic solution Place 1 drop into both eyes every 12 (twelve) hours.   butalbital-acetaminophen-caffeine (FIORICET) 50-325-40 MG tablet Take 1 tablet by mouth every 4 (four) hours as needed for headache.   diazepam (VALIUM) 5 MG tablet Take 2.5-5 mg by mouth See admin instructions. 5 mg in the morning, 2.5 mg midday, 5 mg in the evening   divalproex (DEPAKOTE ER) 500 MG 24 hr tablet Take 500 mg by mouth at bedtime.  docusate sodium (COLACE) 250 MG capsule Take 250 mg by mouth 2 (two) times daily.   donepezil (ARICEPT) 10 MG tablet Take 10 mg by mouth daily.   FANAPT 12 MG TABS Take 12 mg by mouth 2 (two) times daily.    Fluocinolone Acetonide 0.01 % OIL Place 2-4 drops in ear(s) every other day. At bedtime for dry skin   fluticasone (FLOVENT HFA) 220 MCG/ACT inhaler Inhale 2 puffs into the lungs 2 (two) times daily. Rinse out mouth afterwards   gabapentin (NEURONTIN)  600 MG tablet Take 600 mg by mouth 3 (three) times daily.   gemfibrozil (LOPID) 600 MG tablet Take 600 mg by mouth 2 (two) times daily before a meal.   glimepiride (AMARYL) 4 MG tablet Take 4 mg by mouth daily with breakfast.    glycopyrrolate (ROBINUL) 2 MG tablet Take 1 mg by mouth 2 (two) times daily.   guaiFENesin (MUCINEX) 600 MG 12 hr tablet Take 600 mg by mouth 2 (two) times daily.   HUMALOG KWIKPEN 100 UNIT/ML KwikPen Inject 6 Units into the skin 3 (three) times daily. Per sliding scale   Insulin Degludec (TRESIBA FLEXTOUCH) 200 UNIT/ML SOPN Inject 10 Units into the skin daily. (Patient taking differently: Inject 46 Units into the skin daily.)   losartan (COZAAR) 25 MG tablet Take 25 mg by mouth daily.   memantine (NAMENDA) 10 MG tablet Take 10 mg by mouth 2 (two) times daily.   metFORMIN (GLUCOPHAGE) 1000 MG tablet Take 1,000 mg by mouth 2 (two) times daily with a meal.   mirtazapine (REMERON) 30 MG tablet Take 30 mg by mouth at bedtime.   mometasone (ELOCON) 0.1 % ointment Apply 1 application topically See admin instructions. Apply 4 drops to ear canal at bedtime every other night for itching and dry skin.   nitroGLYCERIN (NITROSTAT) 0.4 MG SL tablet Place 0.4 mg under the tongue every 5 (five) minutes as needed for chest pain.    Omega-3 Fatty Acids (FISH OIL) 1000 MG CAPS Take 1,000 mg by mouth daily.   omeprazole (PRILOSEC) 40 MG capsule Take 40 mg by mouth 2 (two) times daily.    ondansetron (ZOFRAN) 4 MG tablet Take 4 mg by mouth every 8 (eight) hours as needed for nausea or vomiting.   OZEMPIC, 1 MG/DOSE, 4 MG/3ML SOPN Inject 1 mg into the skin every Monday.   rosuvastatin (CRESTOR) 40 MG tablet Take 40 mg by mouth daily.    senna (SENOKOT) 8.6 MG TABS tablet Take 1 tablet by mouth daily.    sucralfate (CARAFATE) 1 g tablet Take 1 g by mouth 4 (four) times daily.    tamsulosin (FLOMAX) 0.4 MG CAPS capsule Take 0.4 mg by mouth daily.   venlafaxine (EFFEXOR) 37.5 MG tablet Take  37.5 mg by mouth 2 (two) times daily. Take with 75 mg in am   venlafaxine XR (EFFEXOR-XR) 75 MG 24 hr capsule Take 1 capsule (75 mg total) by mouth daily with breakfast.   ziprasidone (GEODON) 40 MG capsule Take 60 mg by mouth daily.    Social History: Social History   Tobacco Use   Smoking status: Never   Smokeless tobacco: Never  Vaping Use   Vaping Use: Never used  Substance Use Topics   Alcohol use: No  Alcohol/week: 0.0 standard drinks   Drug use: Not Currently  Comment: Prior cocaine and marijuana use   Family Medical History: Family History  Problem Relation Age of Onset   Myocardial Infarction (Heart attack) Mother  Diabetes type II Mother   Mental illness Mother   Coronary Artery Disease (Blocked arteries around heart) Mother   Myocardial Infarction (Heart attack) Father   Coronary Artery Disease (Blocked arteries around heart) Father   High blood pressure (Hypertension) Father   No Known Problems Sister   Diabetes Brother   No Known Problems Maternal Grandmother   No Known Problems Maternal Grandfather   No Known Problems Paternal Grandmother   No Known Problems Paternal Grandfather   Colon polyps Maternal Aunt   Physical Examination:  Vitals:   07/15/21 0629  BP: 126/81  Pulse: 70  Resp: 16  Temp: (!) 97 F (36.1 C)  SpO2: 97%    General: Patient is well developed, well nourished, calm, collected, and in no apparent distress. Attention to examination is appropriate.  Psychiatric: Patient is non-anxious.  Head: Pupils equal, round, and reactive to light.  ENT: Oral mucosa appears well hydrated.  Neck: Supple. Full range of motion.  Respiratory: Patient is breathing without any difficulty.  Extremities: No edema.  Vascular: Palpable dorsal pedal pulses.  Skin: On exposed skin, there are no abnormal skin lesions.  NEUROLOGICAL:   Awake, alert, oriented to person, place, and time. Speech is clear and fluent. Fund of knowledge is  appropriate.   Cranial Nerves: Pupils equal round and reactive to light. Facial tone is symmetric. Facial sensation is symmetric. Shoulder shrug is symmetric. Tongue protrusion is midline. There is no pronator drift.  ROM of spine: Stooped posture   Strength: Side Biceps Triceps Deltoid Interossei Grip Wrist Ext. Wrist Flex.  R 5 5 5 5 5 5 5   L 5 5 5 5 5 5 5    Side Iliopsoas Quads Hamstring PF DF EHL  R 5 5 5 5 5 5   L 5 5 5 5 5 5    Reflexes are 1+ and symmetric at the biceps, triceps, brachioradialis, patella and achilles. Hoffman's is absent.  Clonus is not present. Toes are down-going.  Bilateral upper and lower extremity sensation is intact to light touch.   No evidence of dysmetria noted.  Medical Decision Making  Imaging: MRI L spine 04/02/21 IMPRESSION:  1. Mild progression of severe multifactorial spinal stenosis at  L3-4.  2. Unchanged moderate spinal stenosis and moderate to severe neural  foraminal stenosis at L4-5.  3. Unchanged mild spinal stenosis and moderate lateral recess  stenosis at L1-2.   Electronically Signed    By: Logan Bores M.D.    On: 04/03/2021 15:04  I have personally reviewed the images and agree with the above interpretation.  Assessment and Plan: Danny Cook is a pleasant 61 y.o. 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 We will proceed with L3-5 decompression.   Meade Maw MD, [facility] Department of Neurosurgery

## 2021-07-15 NOTE — Anesthesia Procedure Notes (Signed)
Procedure Name: Intubation Date/Time: 07/15/2021 7:22 AM Performed by: Chanetta Marshall, CRNA Pre-anesthesia Checklist: Patient identified, Emergency Drugs available, Suction available and Patient being monitored Patient Re-evaluated:Patient Re-evaluated prior to induction Oxygen Delivery Method: Circle system utilized Preoxygenation: Pre-oxygenation with 100% oxygen Induction Type: IV induction Ventilation: Mask ventilation without difficulty Laryngoscope Size: McGraph and 3 Grade View: Grade I Tube type: Oral Tube size: 7.5 mm Number of attempts: 1 Airway Equipment and Method: Video-laryngoscopy Placement Confirmation: ETT inserted through vocal cords under direct vision, positive ETCO2, breath sounds checked- equal and bilateral and CO2 detector Secured at: 21 cm Tube secured with: Tape Dental Injury: Teeth and Oropharynx as per pre-operative assessment

## 2021-07-15 NOTE — Op Note (Signed)
Indications: Danny Cook is a 61 yo male who presented with neurogenic claudication.  He failed conservative management.  Findings: severe stenosis  Preoperative Diagnosis: Lumbar Stenosis with neurogenic claudication Postoperative Diagnosis: same   EBL: 10 ml IVF: 1200 ml Drains: none Disposition: Extubated and Stable to PACU Complications: none  No foley catheter was placed.   Preoperative Note:   Risks of surgery discussed include: infection, bleeding, stroke, coma, death, paralysis, CSF leak, nerve/spinal cord injury, numbness, tingling, weakness, complex regional pain syndrome, recurrent stenosis and/or disc herniation, vascular injury, development of instability, neck/back pain, need for further surgery, persistent symptoms, development of deformity, and the risks of anesthesia. The patient understood these risks and agreed to proceed.  Operative Note:   1. L3-5 lumbar decompression including central laminectomy and bilateral medial facetectomies including foraminotomies  The patient was then brought from the preoperative center with intravenous access established.  The patient underwent general anesthesia and endotracheal tube intubation, and was then rotated on the [place] rail top where all pressure points were appropriately padded.  The skin was then thoroughly cleansed.  Perioperative antibiotic prophylaxis was administered.  Sterile prep and drapes were then applied and a timeout was then observed.  C-arm was brought into the field under sterile conditions and under lateral visualization the L4-5 interspace was identified and marked.  The incision was marked on the right and injected with local anesthetic. Once this was complete a 3 cm incision was opened with the use of a #10 blade knife.    The metrx tubes were sequentially advanced and confirmed in position at L4-5. An 62mm by 84mm tube was locked in place to the bed side attachment.  The microscope was then sterilely  brought into the field and muscle creep was hemostased with a bipolar and resected with a pituitary rongeur.  A Bovie extender was then used to expose the spinous process and lamina.  Careful attention was placed to not violate the facet capsule. A 3 mm matchstick drill bit was then used to make a hemi-laminotomy trough until the ligamentum flavum was exposed.  This was extended to the base of the spinous process and to the contralateral side to remove all the central bone from each side.  Once this was complete and the underlying ligamentum flavum was visualized, it was dissected with a curette and resected with Kerrison rongeurs.  Extensive ligamentum hypertrophy was noted, requiring a substantial amount of time and care for removal.  The dura was identified and palpated. The kerrison rongeur was then used to remove the medial facet bilaterally until no compression was noted.  A balltip probe was used to confirm decompression of the ipsilateral L5 nerve root.  Additional attention was paid to completion of the contralateral L4-5 foraminotomy until the contralateral traversing nerve root was completely free.  Once this was complete, L4-5 central decompression including medial facetectomy and foraminotomy was confirmed and decompression on both sides was confirmed. No CSF leak was noted.  A Depo-Medrol soaked Gelfoam pledget was placed in the defect.  The wound was copiously irrigated. The tube system was then removed under microscopic visualization and hemostasis was obtained with a bipolar.    After performing the decompression at L4-5, the metrx tubes were sequentially advanced and confirmed in position at L3-4. An 63mm by 90mm tube was locked in place to the bed side attachment.  Fluoroscopy was then removed from the field.  The microscope was then sterilely brought into the field and muscle creep was hemostased with  a bipolar and resected with a pituitary rongeur.  A Bovie extender was then used to expose  the spinous process and lamina.  Careful attention was placed to not violate the facet capsule. A 3 mm matchstick drill bit was then used to make a hemi-laminotomy trough until the ligamentum flavum was exposed.  This was extended to the base of the spinous process and to the contralateral side to remove all the central bone from each side.  Once this was complete and the underlying ligamentum flavum was visualized, it was dissected with a curette and resected with Kerrison rongeurs.  Extensive ligamentum hypertrophy was noted, requiring a substantial amount of time and care for removal.  The dura was identified and palpated. The kerrison rongeur was then used to remove the medial facet bilaterally until no compression was noted.  A balltip probe was used to confirm decompression of the ipsilateral L4 nerve root.  Additional attention was paid to completion of the contralateral foraminotomy until the contralateral L4 nerve root was completely free.  Once this was complete, L3-4 central decompression including medial facetectomy and foraminotomy was confirmed and decompression on both sides was confirmed. A small dural defect was noted on the lateral aspect of the right side of the dura, but no active CSF leak was noted.  A Depo-Medrol soaked Gelfoam pledget was placed in the defect.  The wound was copiously irrigated. The tube system was then removed under microscopic visualization and hemostasis was obtained with a bipolar.     The fascial layer was reapproximated with the use of a 0 Vicryl suture.  Subcutaneous tissue layer was reapproximated using 2-0 Vicryl suture.  3-0 nylon was placed on the skin.  Patient was then rotated back to the preoperative bed awakened from anesthesia and taken to recovery all counts are correct in this case.  I performed the entire procedure with the assistance of Cooper Render PA as an Pensions consultant.  Shakiya Mcneary K. Izora Ribas MD

## 2021-07-15 NOTE — Anesthesia Preprocedure Evaluation (Addendum)
Anesthesia Evaluation  Patient identified by MRN, date of birth, ID band Patient awake    Reviewed: Allergy & Precautions, NPO status , Patient's Chart, lab work & pertinent test results  History of Anesthesia Complications Negative for: history of anesthetic complications  Airway Mallampati: III   Neck ROM: Full    Dental  (+) Upper Dentures, Lower Dentures   Pulmonary sleep apnea ,    Pulmonary exam normal breath sounds clear to auscultation       Cardiovascular hypertension, + CAD (s/p MI) and +CHF  Normal cardiovascular exam Rhythm:Regular Rate:Normal  ECG 07/02/21:  Sinus rhythm with 1st degree A-V block Otherwise normal ECG  Holter monitor placed 01/23/21 for 7 days revealed predominant sinus rhythm with occasional PACs with infrequent dropped beats due to Mobitz 2 second-degree AV block. There were occasional episodes of SVT, the longest lasting 6 minutes and 49 seconds at a rate of 166 bpm. There were no diary entries and the patient reports being asymptomatic.   Neuro/Psych PSYCHIATRIC DISORDERS Anxiety Depression Schizophrenia TIA   GI/Hepatic GERD  ,  Endo/Other  diabetes, Type 2Obesity   Renal/GU Renal disease     Musculoskeletal  (+) Arthritis ,   Abdominal   Peds  Hematology  (+) HIV, Kaposi's sarcoma   Anesthesia Other Findings Reviewed 04/17/21 cardiology note.  Reproductive/Obstetrics                            Anesthesia Physical Anesthesia Plan  ASA: 3  Anesthesia Plan: General   Post-op Pain Management:    Induction: Intravenous  PONV Risk Score and Plan: 2 and Ondansetron, Dexamethasone and Treatment may vary due to age or medical condition  Airway Management Planned: Oral ETT  Additional Equipment:   Intra-op Plan:   Post-operative Plan: Extubation in OR  Informed Consent: I have reviewed the patients History and Physical, chart, labs and discussed the  procedure including the risks, benefits and alternatives for the proposed anesthesia with the patient or authorized representative who has indicated his/her understanding and acceptance.     Dental advisory given  Plan Discussed with: CRNA  Anesthesia Plan Comments: (Patient consented for risks of anesthesia including but not limited to:  - adverse reactions to medications - damage to eyes, teeth, lips or other oral mucosa - nerve damage due to positioning  - sore throat or hoarseness - damage to heart, brain, nerves, lungs, other parts of body or loss of life  Informed patient about role of CRNA in peri- and intra-operative care.  Patient voiced understanding.)        Anesthesia Quick Evaluation

## 2021-07-15 NOTE — Discharge Summary (Signed)
Physician Discharge Summary  Patient ID: Danny Cook. MRN: [id number] DOB/AGE: Feb 23, 1961 61 y.o.  Admit date: 07/15/2021 Discharge date: 07/15/2021  Admission Diagnoses: lumbar stenosis  Discharge Diagnoses:  Active Problems:   * No active hospital problems. *   Discharged Condition: good  Hospital Course:  DEJA KAIGLER is a 61 y.o s/p L3-5 lumbar decompression. His interoperative course was uncomplicated. He was monitored post-operatively in PACU and discharged home after ambulating, urinating, and tolerating PO intake. He was given prescriptions for Robaxin and Oxycodone.  Consults: None  Significant Diagnostic Studies: none  Treatments: surgery: as above. Please see separately dictated operative report for further details.  Discharge Exam: Blood pressure 126/81, pulse 70, temperature (!) 97 F (36.1 C), temperature source Temporal, resp. rate 16, height 5\' 7"  (1.702 m), weight 94.3 kg, SpO2 97 %. CN II-XII grossly intact 5/5 throughout BLE Incision c/d/I with post-op bandage in place  Disposition: Discharge disposition: 01-Home or Self Care        Allergies as of 07/15/2021       Reactions   Trazodone And Nefazodone    Affects other meds that they dont work   [employer] Visual merchandiser Disoproxil]    Damages kidneys   Etodolac Rash        Medication List     STOP taking these medications    aspirin EC 325 MG tablet   HYDROcodone-acetaminophen 5-325 MG tablet Commonly known as: NORCO/VICODIN   oxybutynin 5 MG tablet Commonly known as: DITROPAN       TAKE these medications    acetaminophen 650 MG CR tablet Commonly known as: TYLENOL Take 650 mg by mouth every 8 (eight) hours as needed for pain.   albuterol 108 (90 Base) MCG/ACT inhaler Commonly known as: VENTOLIN HFA Inhale 2 puffs into the lungs every 6 (six) hours as needed for wheezing or shortness of breath.   benztropine 0.5 MG tablet Commonly known as: COGENTIN Take 1.5 mg by  mouth daily.   bismuth subsalicylate 124 PY/09XI suspension Commonly known as: PEPTO BISMOL Take 15 mLs by mouth every 6 (six) hours as needed for indigestion.   brimonidine-timolol 0.2-0.5 % ophthalmic solution Commonly known as: COMBIGAN Place 1 drop into both eyes every 12 (twelve) hours.   butalbital-acetaminophen-caffeine 50-325-40 MG tablet Commonly known as: FIORICET Take 1 tablet by mouth every 4 (four) hours as needed for headache.   diazepam 5 MG tablet Commonly known as: VALIUM Take 2.5-5 mg by mouth See admin instructions. 5 mg in the morning, 2.5 mg midday, 5 mg in the evening   divalproex 500 MG 24 hr tablet Commonly known as: DEPAKOTE ER Take 500 mg by mouth at bedtime.   docusate sodium 250 MG capsule Commonly known as: COLACE Take 250 mg by mouth 2 (two) times daily.   donepezil 10 MG tablet Commonly known as: ARICEPT Take 10 mg by mouth daily.   Fanapt 12 MG Tabs Generic drug: Iloperidone Take 12 mg by mouth 2 (two) times daily.   Fish Oil 1000 MG Caps Take 1,000 mg by mouth daily.   Fluocinolone Acetonide 0.01 % Oil Place 2-4 drops in ear(s) every other day. At bedtime for dry skin   fluticasone 220 MCG/ACT inhaler Commonly known as: Flovent HFA Inhale 2 puffs into the lungs 2 (two) times daily. Rinse out mouth afterwards   gabapentin 600 MG tablet Commonly known as: NEURONTIN Take 600 mg by mouth 3 (three) times daily.   gemfibrozil 600 MG tablet Commonly known as: LOPID  Take 600 mg by mouth 2 (two) times daily before a meal.   glimepiride 4 MG tablet Commonly known as: AMARYL Take 4 mg by mouth daily with breakfast.   glycopyrrolate 2 MG tablet Commonly known as: ROBINUL Take 1 mg by mouth 2 (two) times daily.   guaiFENesin 600 MG 12 hr tablet Commonly known as: MUCINEX Take 600 mg by mouth 2 (two) times daily.   HumaLOG KwikPen 100 UNIT/ML KwikPen Generic drug: insulin lispro Inject 6 Units into the skin 3 (three) times daily.  Per sliding scale   losartan 25 MG tablet Commonly known as: COZAAR Take 25 mg by mouth daily.   memantine 10 MG tablet Commonly known as: NAMENDA Take 10 mg by mouth 2 (two) times daily.   metFORMIN 1000 MG tablet Commonly known as: GLUCOPHAGE Take 1,000 mg by mouth 2 (two) times daily with a meal.   methocarbamol 500 MG tablet Commonly known as: Robaxin Take 1 tablet (500 mg total) by mouth 4 (four) times daily.   mirtazapine 30 MG tablet Commonly known as: REMERON Take 30 mg by mouth at bedtime.   mometasone 0.1 % ointment Commonly known as: ELOCON Apply 1 application topically See admin instructions. Apply 4 drops to ear canal at bedtime every other night for itching and dry skin.   nitroGLYCERIN 0.4 MG SL tablet Commonly known as: NITROSTAT Place 0.4 mg under the tongue every 5 (five) minutes as needed for chest pain.   omeprazole 40 MG capsule Commonly known as: PRILOSEC Take 40 mg by mouth 2 (two) times daily.   ondansetron 4 MG tablet Commonly known as: ZOFRAN Take 4 mg by mouth every 8 (eight) hours as needed for nausea or vomiting.   oxyCODONE 5 MG immediate release tablet Commonly known as: Roxicodone Take 1 tablet (5 mg total) by mouth every 4 (four) hours as needed for up to 5 days for severe pain.   Ozempic (1 MG/DOSE) 4 MG/3ML Sopn Generic drug: Semaglutide (1 MG/DOSE) Inject 1 mg into the skin every Monday.   rosuvastatin 40 MG tablet Commonly known as: CRESTOR Take 40 mg by mouth daily.   senna 8.6 MG Tabs tablet Commonly known as: SENOKOT Take 1 tablet by mouth daily.   sucralfate 1 g tablet Commonly known as: CARAFATE Take 1 g by mouth 4 (four) times daily.   tamsulosin 0.4 MG Caps capsule Commonly known as: FLOMAX Take 0.4 mg by mouth daily.   Tyler Aas FlexTouch 200 UNIT/ML FlexTouch Pen Generic drug: insulin degludec Inject 10 Units into the skin daily. What changed: how much to take   Triumeq 600-50-300 MG tablet Generic drug:  abacavir-dolutegravir-lamiVUDine Take 1 tablet by mouth at bedtime.   venlafaxine 37.5 MG tablet Commonly known as: EFFEXOR Take 37.5 mg by mouth 2 (two) times daily. Take with 75 mg in am   venlafaxine XR 75 MG 24 hr capsule Commonly known as: EFFEXOR-XR Take 1 capsule (75 mg total) by mouth daily with breakfast.   ziprasidone 40 MG capsule Commonly known as: GEODON Take 60 mg by mouth daily.        Follow-up Information     [name], PA Follow up in 2 week(s).   Contact information: [place] [address] [phone]                 Signed: [name] 07/15/2021, 9:47 AM

## 2021-07-15 NOTE — Anesthesia Postprocedure Evaluation (Signed)
Anesthesia Post Note  Patient: Danny Cook.  Procedure(s) Performed: L3-5 DECOMPRESSION (Spine Lumbar)  Patient location during evaluation: PACU Anesthesia Type: General Level of consciousness: awake and alert, oriented and patient cooperative Pain management: pain level controlled Vital Signs Assessment: post-procedure vital signs reviewed and stable Respiratory status: spontaneous breathing, nonlabored ventilation and respiratory function stable Cardiovascular status: blood pressure returned to baseline and stable Postop Assessment: adequate PO intake Anesthetic complications: no   No notable events documented.   Last Vitals:  Vitals:   07/15/21 1115 07/15/21 1137  BP: (!) 149/94 (!) 150/92  Pulse: (!) 56 (!) 56  Resp: 14 16  Temp: (!) 36.3 C 36.4 C  SpO2: 98% 98%    Last Pain:  Vitals:   07/15/21 1137  TempSrc:   PainSc: 0-No pain                 Darrin Nipper

## 2021-07-15 NOTE — Transfer of Care (Signed)
Immediate Anesthesia Transfer of Care Note  Patient: Danny Cook.  Procedure(s) Performed: L3-5 DECOMPRESSION (Spine Lumbar)  Patient Location: PACU  Anesthesia Type:General  Level of Consciousness: awake, alert  and oriented  Airway & Oxygen Therapy: Patient Spontanous Breathing  Post-op Assessment: Report given to RN and Post -op Vital signs reviewed and stable  Post vital signs: Reviewed and stable  Last Vitals:  Vitals Value Taken Time  BP    Temp    Pulse    Resp    SpO2      Last Pain:  Vitals:   07/15/21 0629  TempSrc: Temporal      Patients Stated Pain Goal: 0 (33/82/50 5397)  Complications: No notable events documented.

## 2021-07-16 ENCOUNTER — Encounter: Payer: Self-pay | Admitting: Neurosurgery

## 2021-08-08 ENCOUNTER — Ambulatory Visit: Payer: Medicare Other | Admitting: Infectious Diseases

## 2021-09-02 ENCOUNTER — Ambulatory Visit: Payer: Medicare Other | Admitting: Family

## 2021-09-12 ENCOUNTER — Ambulatory Visit: Payer: Medicare Other | Admitting: Family

## 2021-09-13 ENCOUNTER — Other Ambulatory Visit: Payer: Self-pay

## 2021-09-13 ENCOUNTER — Ambulatory Visit: Payer: Medicare Other | Attending: Family | Admitting: Family

## 2021-09-13 ENCOUNTER — Encounter: Payer: Self-pay | Admitting: Family

## 2021-09-13 VITALS — BP 118/76 | HR 91 | Resp 14 | Ht 67.0 in | Wt 210.0 lb

## 2021-09-13 DIAGNOSIS — Z794 Long term (current) use of insulin: Secondary | ICD-10-CM

## 2021-09-13 DIAGNOSIS — E7800 Pure hypercholesterolemia, unspecified: Secondary | ICD-10-CM | POA: Insufficient documentation

## 2021-09-13 DIAGNOSIS — M1990 Unspecified osteoarthritis, unspecified site: Secondary | ICD-10-CM | POA: Diagnosis not present

## 2021-09-13 DIAGNOSIS — F419 Anxiety disorder, unspecified: Secondary | ICD-10-CM | POA: Diagnosis not present

## 2021-09-13 DIAGNOSIS — I5032 Chronic diastolic (congestive) heart failure: Secondary | ICD-10-CM | POA: Diagnosis not present

## 2021-09-13 DIAGNOSIS — N189 Chronic kidney disease, unspecified: Secondary | ICD-10-CM | POA: Insufficient documentation

## 2021-09-13 DIAGNOSIS — Z833 Family history of diabetes mellitus: Secondary | ICD-10-CM | POA: Diagnosis not present

## 2021-09-13 DIAGNOSIS — I130 Hypertensive heart and chronic kidney disease with heart failure and stage 1 through stage 4 chronic kidney disease, or unspecified chronic kidney disease: Secondary | ICD-10-CM | POA: Diagnosis present

## 2021-09-13 DIAGNOSIS — Z8249 Family history of ischemic heart disease and other diseases of the circulatory system: Secondary | ICD-10-CM | POA: Insufficient documentation

## 2021-09-13 DIAGNOSIS — I10 Essential (primary) hypertension: Secondary | ICD-10-CM

## 2021-09-13 DIAGNOSIS — M549 Dorsalgia, unspecified: Secondary | ICD-10-CM | POA: Insufficient documentation

## 2021-09-13 DIAGNOSIS — Z9889 Other specified postprocedural states: Secondary | ICD-10-CM

## 2021-09-13 DIAGNOSIS — F200 Paranoid schizophrenia: Secondary | ICD-10-CM | POA: Diagnosis not present

## 2021-09-13 DIAGNOSIS — G4733 Obstructive sleep apnea (adult) (pediatric): Secondary | ICD-10-CM | POA: Diagnosis not present

## 2021-09-13 DIAGNOSIS — E1122 Type 2 diabetes mellitus with diabetic chronic kidney disease: Secondary | ICD-10-CM | POA: Insufficient documentation

## 2021-09-13 DIAGNOSIS — N182 Chronic kidney disease, stage 2 (mild): Secondary | ICD-10-CM

## 2021-09-13 DIAGNOSIS — Z21 Asymptomatic human immunodeficiency virus [HIV] infection status: Secondary | ICD-10-CM | POA: Insufficient documentation

## 2021-09-13 NOTE — Patient Instructions (Signed)
Continue weighing daily and call for an overnight weight gain of 3 pounds or more or a weekly weight gain of more than 5 pounds.   If you have voicemail, please make sure your mailbox is cleaned out so that we may leave a message and please make sure to listen to any voicemails.     

## 2021-09-13 NOTE — Progress Notes (Signed)
? Patient ID: Danny Cook., male    DOB: 11/30/1960, 61 y.o.   MRN: [id number] ? ?HPI ? ?[name] is a 61 y/o male with a history of DM, hyperlipidemia, HTN, CKD, HIV, schizophrenia, obstructive sleep apnea, anxiety, osteoarthritis and chronic heart failure.  ? ?Echo report from 07/25/20 reviewed and showed an EF of 55-60% along with mild LVH. Echo report from 06/08/18 reviewed and showed an EF of 55-60%. Echo report from 03/25/18 reviewed and showed an EF of 55-60%. Echo report from 07/31/17 reviewed and showed an EF of 55-65%. Echo report from 05/11/17 reviewed and showed an EF of 55% along with mild AR/ TR/ MR. ? ?Had lumbar decompression surgery done 07/15/21. ? ?He presents today for a follow-up visit with a chief complaint of moderate fatigue with minimal exertion. He describes this as chronic in nature. He has associated shortness of breath, pedal edema in left leg, light-headedness and leg weakness along with this. He denies cough, chest pain, palpitations, abdominal distention, difficulty sleeping or weight gain.  ? ?He says that he's not being weighed daily and that he only has one TED sock left, he's not sure if he lost it or if the facility lost it. Has been doing PT exercises at home on his own. Will be moving to assisted living at [facility] at some point and he's looking forward to that. Says that he has a Brewing technologist that is working with him about this.  ? ?Past Medical History:  ?Diagnosis Date  ? Anemia   ? Anxiety   ? Arthritis   ? CHF (congestive heart failure) ([place])   ? Chronic kidney disease   ? Renal Insufficiency Syndrome; Glomerulosclerosis 2013  ? Depressed   ? Diabetes mellitus without complication ([place])   ? GERD (gastroesophageal reflux disease)   ? Headache   ? High cholesterol   ? History of kidney stones   ? HIV (human immunodeficiency virus infection) ([place])   ? Hypertension   ? 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[place])   ? Myocardial infarction [facility])   ? Paranoid disorder ([place])   ?  Pneumonia   ? Schizophrenia, paranoid ([place])   ? Sleep apnea   ? Stroke [facility])   ? mini TIA  ? TIA (transient ischemic attack)   ? ?Past Surgical History:  ?Procedure Laterality Date  ? CARDIAC CATHETERIZATION    ? CATARACT EXTRACTION W/PHACO Right 04/25/2021  ? Procedure: CATARACT EXTRACTION PHACO AND INTRAOCULAR LENS PLACEMENT ([place]) RIGHT DIABETIC;  Surgeon: Birder Robson, MD;  Location: [facility] ORS;  Service: Ophthalmology;  Laterality: Right;  3.32 ?0:23.0  ? COLONOSCOPY WITH PROPOFOL N/A 09/03/2015  ? Procedure: COLONOSCOPY WITH PROPOFOL;  Surgeon: Lollie Sails, MD;  Location: [facility] ENDOSCOPY;  Service: Endoscopy;  Laterality: N/A;  ? COLONOSCOPY WITH PROPOFOL N/A 09/17/2020  ? Procedure: COLONOSCOPY WITH PROPOFOL;  Surgeon: Lesly Rubenstein, MD;  Location: [facility] ENDOSCOPY;  Service: Endoscopy;  Laterality: N/A;  PREFERS 9AM OR LATER DUE TO TRANSPORTATION  ? DG TEETH FULL    ? ESOPHAGOGASTRODUODENOSCOPY (EGD) WITH PROPOFOL N/A 07/01/2016  ? Dr. [name], [facility]. abnormal esophageal motility, suspicious for presbyesophagus, bile gastritis s/p biopsy, non-bleeding erosive gastropathy s/p biopsy, normal duodenum, path with negative H.pylori and +chronic active gastritis  ? ESOPHAGOGASTRODUODENOSCOPY (EGD) WITH PROPOFOL N/A 09/17/2020  ? Procedure: ESOPHAGOGASTRODUODENOSCOPY (EGD) WITH PROPOFOL;  Surgeon: Lesly Rubenstein, MD;  Location: [facility] ENDOSCOPY;  Service: Endoscopy;  Laterality: N/A;  ? EXTRACORPOREAL SHOCK WAVE LITHOTRIPSY Right 11/01/2020  ? Procedure: EXTRACORPOREAL  SHOCK WAVE LITHOTRIPSY (ESWL);  Surgeon: Abbie Sons, MD;  Location: [facility] ORS;  Service: Urology;  Laterality: Right;  ? LUMBAR LAMINECTOMY/DECOMPRESSION MICRODISCECTOMY N/A 07/15/2021  ? Procedure: L3-5 DECOMPRESSION;  Surgeon: Meade Maw, MD;  Location: [facility] ORS;  Service: Neurosurgery;  Laterality: N/A;  ? ?Family History  ?Problem Relation Age of Onset  ? Heart attack Mother   ? Diabetes Mellitus II Mother   ? Mental  illness Mother   ? CAD Mother   ? Heart attack Father   ? CAD Father   ? Hypertension Father   ? ?Social History  ? ?Tobacco Use  ? Smoking status: Never  ? Smokeless tobacco: Never  ?Substance Use Topics  ? Alcohol use: No  ? ?Allergies  ?Allergen Reactions  ? Trazodone And Nefazodone   ?  Affects other meds that they dont work  ? Viread [Tenofovir Disoproxil]   ?  Damages kidneys  ? Etodolac Rash  ? ?Prior to Admission medications   ?Medication Sig Start Date End Date Taking? Authorizing Provider  ?abacavir-dolutegravir-lamiVUDine (TRIUMEQ) 600-50-300 MG tablet Take 1 tablet by mouth at bedtime. 07/21/19  Yes Tsosie Billing, MD  ?acetaminophen (TYLENOL) 650 MG CR tablet Take 650 mg by mouth every 8 (eight) hours as needed for pain.   Yes [provider]  ?albuterol (PROVENTIL HFA;VENTOLIN HFA) 108 (90 Base) MCG/ACT inhaler Inhale 2 puffs into the lungs every 6 (six) hours as needed for wheezing or shortness of breath. 04/14/18  Yes Wieting, Richard, MD  ?aspirin 325 MG tablet Take 325 mg by mouth daily.   Yes [provider]  ?benztropine (COGENTIN) 0.5 MG tablet Take 1.5 mg by mouth daily.   Yes [provider]  ?bismuth subsalicylate (PEPTO BISMOL) 262 MG/15ML suspension Take 15 mLs by mouth every 6 (six) hours as needed for indigestion.   Yes [provider]  ?brimonidine-timolol (COMBIGAN) 0.2-0.5 % ophthalmic solution Place 1 drop into both eyes every 12 (twelve) hours.   Yes [provider]  ?butalbital-acetaminophen-caffeine (FIORICET) 50-325-40 MG tablet Take 1 tablet by mouth every 4 (four) hours as needed for headache.   Yes [provider]  ?diazepam (VALIUM) 5 MG tablet Take 2.5-5 mg by mouth See admin instructions. 5 mg in the morning, 2.5 mg midday, 5 mg in the evening   Yes [provider]  ?dicyclomine (BENTYL) 10 MG capsule Take 10 mg by mouth 4 (four) times daily -  before meals and at bedtime.   Yes [provider]   ?divalproex (DEPAKOTE ER) 500 MG 24 hr tablet Take 500 mg by mouth at bedtime.    Yes [provider]  ?docusate sodium (COLACE) 250 MG capsule Take 250 mg by mouth 2 (two) times daily.   Yes [provider]  ?donepezil (ARICEPT) 10 MG tablet Take 5 mg by mouth daily. 09/13/20  Yes [provider]  ?FANAPT 12 MG TABS Take 12 mg by mouth 2 (two) times daily.  11/14/16  Yes [provider]  ?Fluocinolone Acetonide 0.01 % OIL Place 2-4 drops in ear(s) every other day. At bedtime for dry skin   Yes [provider]  ?fluticasone (FLOVENT HFA) 220 MCG/ACT inhaler Inhale 2 puffs into the lungs 2 (two) times daily. Rinse out mouth afterwards 04/14/18  Yes Wieting, Richard, MD  ?gabapentin (NEURONTIN) 600 MG tablet Take 800 mg by mouth 3 (three) times daily.   Yes [provider]  ?gemfibrozil (LOPID) 600 MG tablet Take 600 mg by mouth 2 (two)  times daily before a meal.   Yes [provider]  ?glimepiride (AMARYL) 4 MG tablet Take 4 mg by mouth daily with breakfast.  06/06/19  Yes [provider]  ?glycopyrrolate (ROBINUL) 2 MG tablet Take 0.5 mg by mouth 2 (two) times daily. 10/15/20  Yes [provider]  ?HUMALOG KWIKPEN 100 UNIT/ML KwikPen Inject 6 Units into the skin 3 (three) times daily. Per sliding scale 09/10/20  Yes [provider]  ?Insulin Degludec (TRESIBA FLEXTOUCH) 200 UNIT/ML SOPN Inject 10 Units into the skin daily. ?Patient taking differently: Inject 46 Units into the skin daily. 08/19/19  Yes Nolberto Hanlon, MD  ?losartan (COZAAR) 25 MG tablet Take 25 mg by mouth daily. 02/02/21  Yes [provider]  ?memantine (NAMENDA) 10 MG tablet Take 10 mg by mouth 2 (two) times daily.   Yes [provider]  ?metFORMIN (GLUCOPHAGE) 1000 MG tablet Take 1,000 mg by mouth 2 (two) times daily with a meal.   Yes [provider]  ?methocarbamol (ROBAXIN) 500 MG tablet Take 1 tablet (500 mg total) by mouth 4 (four)  times daily. 07/15/21  Yes [name], PA  ?mirtazapine (REMERON) 30 MG tablet Take 30 mg by mouth at bedtime. 04/21/18  Yes [provider]  ?mometasone (ELOCON) 0.1 % ointment Apply 1 applicat

## 2021-09-19 ENCOUNTER — Ambulatory Visit: Payer: Medicare Other | Admitting: Anesthesiology

## 2021-09-19 ENCOUNTER — Encounter
Admission: RE | Disposition: A | Discharge status: Home / Self Care | Payer: Self-pay | Source: Home / Self Care | Attending: Ophthalmology

## 2021-09-19 ENCOUNTER — Ambulatory Visit
Admission: RE | Admit: 2021-09-19 | Discharge: 2021-09-19 | Disposition: A | Discharge status: Home / Self Care | Payer: Medicare Other | Attending: Ophthalmology | Admitting: Ophthalmology

## 2021-09-19 ENCOUNTER — Encounter: Payer: Self-pay | Admitting: Ophthalmology

## 2021-09-19 DIAGNOSIS — Z794 Long term (current) use of insulin: Secondary | ICD-10-CM | POA: Diagnosis not present

## 2021-09-19 DIAGNOSIS — N182 Chronic kidney disease, stage 2 (mild): Secondary | ICD-10-CM | POA: Insufficient documentation

## 2021-09-19 DIAGNOSIS — E1136 Type 2 diabetes mellitus with diabetic cataract: Secondary | ICD-10-CM | POA: Diagnosis present

## 2021-09-19 DIAGNOSIS — M1990 Unspecified osteoarthritis, unspecified site: Secondary | ICD-10-CM | POA: Diagnosis not present

## 2021-09-19 DIAGNOSIS — Z8673 Personal history of transient ischemic attack (TIA), and cerebral infarction without residual deficits: Secondary | ICD-10-CM | POA: Insufficient documentation

## 2021-09-19 DIAGNOSIS — F32A Depression, unspecified: Secondary | ICD-10-CM | POA: Insufficient documentation

## 2021-09-19 DIAGNOSIS — K219 Gastro-esophageal reflux disease without esophagitis: Secondary | ICD-10-CM | POA: Insufficient documentation

## 2021-09-19 DIAGNOSIS — H2512 Age-related nuclear cataract, left eye: Secondary | ICD-10-CM | POA: Insufficient documentation

## 2021-09-19 DIAGNOSIS — I252 Old myocardial infarction: Secondary | ICD-10-CM | POA: Diagnosis not present

## 2021-09-19 DIAGNOSIS — I2510 Atherosclerotic heart disease of native coronary artery without angina pectoris: Secondary | ICD-10-CM | POA: Diagnosis not present

## 2021-09-19 DIAGNOSIS — B20 Human immunodeficiency virus [HIV] disease: Secondary | ICD-10-CM | POA: Diagnosis not present

## 2021-09-19 DIAGNOSIS — I509 Heart failure, unspecified: Secondary | ICD-10-CM | POA: Insufficient documentation

## 2021-09-19 DIAGNOSIS — C469 Kaposi's sarcoma, unspecified: Secondary | ICD-10-CM | POA: Diagnosis not present

## 2021-09-19 DIAGNOSIS — E1122 Type 2 diabetes mellitus with diabetic chronic kidney disease: Secondary | ICD-10-CM | POA: Diagnosis not present

## 2021-09-19 DIAGNOSIS — Z8249 Family history of ischemic heart disease and other diseases of the circulatory system: Secondary | ICD-10-CM | POA: Insufficient documentation

## 2021-09-19 DIAGNOSIS — F419 Anxiety disorder, unspecified: Secondary | ICD-10-CM | POA: Diagnosis not present

## 2021-09-19 DIAGNOSIS — I130 Hypertensive heart and chronic kidney disease with heart failure and stage 1 through stage 4 chronic kidney disease, or unspecified chronic kidney disease: Secondary | ICD-10-CM | POA: Diagnosis not present

## 2021-09-19 DIAGNOSIS — G4730 Sleep apnea, unspecified: Secondary | ICD-10-CM | POA: Diagnosis not present

## 2021-09-19 HISTORY — PX: CATARACT EXTRACTION W/PHACO: SHX586

## 2021-09-19 LAB — GLUCOSE, CAPILLARY: Glucose-Capillary: 190 mg/dL — ABNORMAL HIGH (ref 70–99)

## 2021-09-19 SURGERY — PHACOEMULSIFICATION, CATARACT, WITH IOL INSERTION
Anesthesia: Monitor Anesthesia Care | Site: Eye | Laterality: Left

## 2021-09-19 MED ORDER — ONDANSETRON HCL 4 MG/2ML IJ SOLN: 4.0000 mg | Freq: Once | INTRAMUSCULAR | Status: DC | PRN

## 2021-09-19 MED ORDER — MOXIFLOXACIN HCL 0.5 % OP SOLN
OPHTHALMIC | Status: DC | PRN
  Administered 2021-09-19: 0.2 mL via OPHTHALMIC

## 2021-09-19 MED ORDER — BRIMONIDINE TARTRATE-TIMOLOL 0.2-0.5 % OP SOLN
OPHTHALMIC | Status: DC | PRN
  Administered 2021-09-19: 1 [drp] via OPHTHALMIC

## 2021-09-19 MED ORDER — TETRACAINE HCL 0.5 % OP SOLN
1.0000 [drp] | Freq: Once | OPHTHALMIC | Status: AC
  Administered 2021-09-19: 1 [drp] via OPHTHALMIC

## 2021-09-19 MED ORDER — SIGHTPATH DOSE#1 NA CHONDROIT SULF-NA HYALURON 40-17 MG/ML IO SOLN
INTRAOCULAR | Status: DC | PRN
  Administered 2021-09-19: 1 mL via INTRAOCULAR

## 2021-09-19 MED ORDER — FENTANYL CITRATE (PF) 100 MCG/2ML IJ SOLN
INTRAMUSCULAR | Status: AC
  Filled 2021-09-19: qty 2

## 2021-09-19 MED ORDER — TETRACAINE HCL 0.5 % OP SOLN
OPHTHALMIC | Status: AC
  Filled 2021-09-19: qty 4

## 2021-09-19 MED ORDER — SIGHTPATH DOSE#1 BSS IO SOLN
INTRAOCULAR | Status: DC | PRN
  Administered 2021-09-19: 1 mL

## 2021-09-19 MED ORDER — MOXIFLOXACIN HCL 0.5 % OP SOLN
OPHTHALMIC | Status: AC
  Filled 2021-09-19: qty 3

## 2021-09-19 MED ORDER — ARMC OPHTHALMIC DILATING DROPS
OPHTHALMIC | Status: AC
  Filled 2021-09-19: qty 0.5

## 2021-09-19 MED ORDER — MIDAZOLAM HCL 2 MG/2ML IJ SOLN
INTRAMUSCULAR | Status: AC
  Filled 2021-09-19: qty 2

## 2021-09-19 MED ORDER — ACETAMINOPHEN 325 MG PO TABS: 650.0000 mg | ORAL_TABLET | Freq: Once | ORAL | Status: DC | PRN

## 2021-09-19 MED ORDER — ARMC OPHTHALMIC DILATING DROPS
1.0000 "application " | OPHTHALMIC | Status: AC
  Administered 2021-09-19 (×3): 1 via OPHTHALMIC

## 2021-09-19 MED ORDER — MIDAZOLAM HCL 2 MG/2ML IJ SOLN
INTRAMUSCULAR | Status: DC | PRN
  Administered 2021-09-19: .5 mg via INTRAVENOUS

## 2021-09-19 MED ORDER — ACETAMINOPHEN 160 MG/5ML PO SOLN
325.0000 mg | ORAL | Status: DC | PRN
  Filled 2021-09-19: qty 20.3

## 2021-09-19 MED ORDER — MOXIFLOXACIN HCL 0.5 % OP SOLN: 1.0000 [drp] | Freq: Once | OPHTHALMIC | Status: DC

## 2021-09-19 MED ORDER — GLYCOPYRROLATE 0.2 MG/ML IJ SOLN
INTRAMUSCULAR | Status: DC | PRN
  Administered 2021-09-19: .2 mg via INTRAVENOUS

## 2021-09-19 MED ORDER — FENTANYL CITRATE (PF) 100 MCG/2ML IJ SOLN
INTRAMUSCULAR | Status: DC | PRN
  Administered 2021-09-19: 25 ug via INTRAVENOUS

## 2021-09-19 MED ORDER — SODIUM CHLORIDE 0.9 % IV SOLN: INTRAVENOUS | Status: DC

## 2021-09-19 MED ORDER — SIGHTPATH DOSE#1 BSS IO SOLN
INTRAOCULAR | Status: DC | PRN
  Administered 2021-09-19: 15 mL

## 2021-09-19 MED ORDER — SIGHTPATH DOSE#1 BSS IO SOLN
INTRAOCULAR | Status: DC | PRN
  Administered 2021-09-19: 52 mL via OPHTHALMIC

## 2021-09-19 SURGICAL SUPPLY — 16 items
CATARACT SUITE SIGHTPATH (MISCELLANEOUS) ×2 IMPLANT
FEE CATARACT SUITE SIGHTPATH (MISCELLANEOUS) ×2 IMPLANT
GLOVE SURG ENC MOIS LTX SZ8 (GLOVE) ×3 IMPLANT
GLOVE SURG ENC TEXT LTX SZ6.5 (GLOVE) ×3 IMPLANT
GLOVE SURG ENC TEXT LTX SZ8 (GLOVE) ×3 IMPLANT
GOWN STRL REUS W/ TWL LRG LVL3 (GOWN DISPOSABLE) ×4 IMPLANT
GOWN STRL REUS W/TWL LRG LVL3 (GOWN DISPOSABLE) ×4
LENS IOL TECNIS EYHANCE 21.5 (Intraocular Lens) ×1 IMPLANT
NDL FILTER BLUNT 18X1 1/2 (NEEDLE) ×2 IMPLANT
NEEDLE FILTER BLUNT 18X 1/2SAF (NEEDLE) ×1
NEEDLE FILTER BLUNT 18X1 1/2 (NEEDLE) ×1 IMPLANT
SYR 3ML LL SCALE MARK (SYRINGE) ×3 IMPLANT
SYR 5ML LL (SYRINGE) IMPLANT
SYR TB 1ML 27GX1/2 LL (SYRINGE) ×3 IMPLANT
WATER STERILE IRR 250ML POUR (IV SOLUTION) ×3 IMPLANT
WIPE NON LINTING 3.25X3.25 (MISCELLANEOUS) IMPLANT

## 2021-09-19 NOTE — Discharge Instructions (Addendum)
Eye Surgery Discharge Instructions ? ? ? ?Expect mild scratchy sensation or mild soreness. ?DO NOT RUB YOUR EYE! ? ?The day of surgery: ?Minimal physical activity, but bed rest is not required ?No reading, computer work, or close hand work ?No bending, lifting, or straining. ?May watch TV ? ?For 24 hours: ?No driving, legal decisions, or alcoholic beverages ?Safety precautions ?Eat anything you prefer: It is better to start with liquids, then soup then solid foods. ?_____ Eye patch should be worn until postoperative exam tomorrow. ?__x__ Solar shield eyeglasses should be worn for comfort in the sunlight/patch while sleeping ? ?Resume all regular medications including aspirin or Coumadin if these were discontinued prior to surgery. ?You may shower, bathe, shave, or wash your hair. ?Tylenol may be taken for mild discomfort. ? ?Call your doctor if you experience significant pain, nausea, or vomiting, fever > 101 or other signs of infection. [phone] or [phone] ?Specific instructions: ? ? Follow-up Information   ? ? Birder Robson, MD Follow up.   ?Specialty: Ophthalmology ?Why: as scheduled ?Contact information: ?[address] ?[place] [address] ?[phone] ? ? ?  ?  ? ?  ?  ? ?  ?  ?

## 2021-09-19 NOTE — H&P (Signed)
[facility]   Primary Care Physician:  Gayla Doss, MD Ophthalmologist: Dr. Druscilla Brownie  Pre-Procedure History & Physical: HPI:  Danny Helderman. is a 61 y.o. male here for cataract surgery.   Past Medical History:  Diagnosis Date   Anemia    Anxiety    Arthritis    CHF (congestive heart failure) (H[facility])    Chronic kidney disease    Renal Insufficiency Syndrome; Glomerulosclerosis 2013   Depressed    Diabetes mellitus without complication (H[facility])    GERD (gastroesophageal reflux disease)    Headache    High cholesterol    History of kidney stones    HIV (human immunodeficiency virus infection) ([facility])    Hypertension    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[facility])    Myocardial infarction (H[facility])    Paranoid disorder (H[facility])    Pneumonia    Schizophrenia, paranoid (H[facility])    Sleep apnea    Stroke ([facility])    mini TIA   TIA (transient ischemic attack)     Past Surgical History:  Procedure Laterality Date   CARDIAC CATHETERIZATION     CATARACT EXTRACTION W/PHACO Right 04/25/2021   Procedure: CATARACT EXTRACTION PHACO AND INTRAOCULAR LENS PLACEMENT ([facility]) RIGHT DIABETIC;  Surgeon: Galen Manila, MD;  Location: [facility] ORS;  Service: Ophthalmology;  Laterality: Right;  3.32 0:23.0   COLONOSCOPY WITH PROPOFOL N/A 09/03/2015   Procedure: COLONOSCOPY WITH PROPOFOL;  Surgeon: Christena Deem, MD;  Location: [facility] ENDOSCOPY;  Service: Endoscopy;  Laterality: N/A;   COLONOSCOPY WITH PROPOFOL N/A 09/17/2020   Procedure: COLONOSCOPY WITH PROPOFOL;  Surgeon: Regis Bill, MD;  Location: [facility] ENDOSCOPY;  Service: Endoscopy;  Laterality: N/A;  PREFERS 9AM OR LATER DUE TO TRANSPORTATION   DG TEETH FULL     ESOPHAGOGASTRODUODENOSCOPY (EGD) WITH PROPOFOL N/A 07/01/2016   Dr. Marva Panda, [facility]. abnormal esophageal motility, suspicious for presbyesophagus, bile gastritis s/p biopsy, non-bleeding erosive gastropathy s/p biopsy, normal duodenum, path with negative H.pylori and +chronic active  gastritis   ESOPHAGOGASTRODUODENOSCOPY (EGD) WITH PROPOFOL N/A 09/17/2020   Procedure: ESOPHAGOGASTRODUODENOSCOPY (EGD) WITH PROPOFOL;  Surgeon: Regis Bill, MD;  Location: [facility] ENDOSCOPY;  Service: Endoscopy;  Laterality: N/A;   EXTRACORPOREAL SHOCK WAVE LITHOTRIPSY Right 11/01/2020   Procedure: EXTRACORPOREAL SHOCK WAVE LITHOTRIPSY (ESWL);  Surgeon: Riki Altes, MD;  Location: [facility] ORS;  Service: Urology;  Laterality: Right;   LUMBAR LAMINECTOMY/DECOMPRESSION MICRODISCECTOMY N/A 07/15/2021   Procedure: L3-5 DECOMPRESSION;  Surgeon: Venetia Night, MD;  Location: [facility] ORS;  Service: Neurosurgery;  Laterality: N/A;    Prior to Admission medications   Medication Sig Start Date End Date Taking? Authorizing Provider  albuterol (PROVENTIL HFA;VENTOLIN HFA) 108 (90 Base) MCG/ACT inhaler Inhale 2 puffs into the lungs every 6 (six) hours as needed for wheezing or shortness of breath. 04/14/18  Yes Wieting, Richard, MD  fluticasone (FLOVENT HFA) 220 MCG/ACT inhaler Inhale 2 puffs into the lungs 2 (two) times daily. Rinse out mouth afterwards 04/14/18  Yes Wieting, Richard, MD  HUMALOG KWIKPEN 100 UNIT/ML KwikPen Inject 6 Units into the skin 3 (three) times daily. Per sliding scale 09/10/20  Yes [provider]  Insulin Degludec (TRESIBA FLEXTOUCH) 200 UNIT/ML SOPN Inject 10 Units into the skin daily. Patient taking differently: Inject 46 Units into the skin daily. 08/19/19  Yes Lynn Ito, MD  abacavir-dolutegravir-lamiVUDine (TRIUMEQ) 600-50-300 MG tablet Take 1 tablet by mouth at bedtime. 07/21/19   Lynn Ito, MD  acetaminophen (TYLENOL) 650 MG CR tablet Take 650 mg by mouth every 8 (eight)  hours as needed for pain.    [provider]  aspirin 325 MG tablet Take 325 mg by mouth daily.    [provider]  benztropine (COGENTIN) 0.5 MG tablet Take 1.5 mg by mouth daily.    [provider]  bismuth subsalicylate (PEPTO BISMOL) 262 MG/15ML  suspension Take 15 mLs by mouth every 6 (six) hours as needed for indigestion.    [provider]  brimonidine-timolol (COMBIGAN) 0.2-0.5 % ophthalmic solution Place 1 drop into both eyes every 12 (twelve) hours.    [provider]  butalbital-acetaminophen-caffeine (FIORICET) 50-325-40 MG tablet Take 1 tablet by mouth every 4 (four) hours as needed for headache.    [provider]  diazepam (VALIUM) 5 MG tablet Take 2.5-5 mg by mouth See admin instructions. 5 mg in the morning, 2.5 mg midday, 5 mg in the evening    [provider]  dicyclomine (BENTYL) 10 MG capsule Take 10 mg by mouth 4 (four) times daily -  before meals and at bedtime.    [provider]  divalproex (DEPAKOTE ER) 500 MG 24 hr tablet Take 500 mg by mouth at bedtime.     [provider]  docusate sodium (COLACE) 250 MG capsule Take 250 mg by mouth 2 (two) times daily.    [provider]  donepezil (ARICEPT) 10 MG tablet Take 5 mg by mouth daily. 09/13/20   [provider]  FANAPT 12 MG TABS Take 12 mg by mouth 2 (two) times daily.  11/14/16   [provider]  Fluocinolone Acetonide 0.01 % OIL Place 2-4 drops in ear(s) every other day. At bedtime for dry skin    [provider]  gabapentin (NEURONTIN) 600 MG tablet Take 800 mg by mouth 3 (three) times daily.    [provider]  gemfibrozil (LOPID) 600 MG tablet Take 600 mg by mouth 2 (two) times daily before a meal.    [provider]  glimepiride (AMARYL) 4 MG tablet Take 4 mg by mouth daily with breakfast.  06/06/19   [provider]  glycopyrrolate (ROBINUL) 2 MG tablet Take 0.5 mg by mouth 2 (two) times daily. 10/15/20   [provider]  guaiFENesin (MUCINEX) 600 MG 12 hr tablet Take 600 mg by mouth 2 (two) times daily. Patient not taking: Reported on 09/13/2021    [provider]  losartan (COZAAR) 25 MG tablet Take 25 mg by mouth daily. 02/02/21    [provider]  memantine (NAMENDA) 10 MG tablet Take 10 mg by mouth 2 (two) times daily.    [provider]  metFORMIN (GLUCOPHAGE) 1000 MG tablet Take 1,000 mg by mouth 2 (two) times daily with a meal.    [provider]  methocarbamol (ROBAXIN) 500 MG tablet Take 1 tablet (500 mg total) by mouth 4 (four) times daily. 07/15/21   Susanne Borders, PA  mirtazapine (REMERON) 30 MG tablet Take 30 mg by mouth at bedtime. 04/21/18   [provider]  mometasone (ELOCON) 0.1 % ointment Apply 1 application topically See admin instructions. Apply 4 drops to ear canal at bedtime every other night for itching and dry skin.    [provider]  nitroGLYCERIN (NITROSTAT) 0.4 MG SL tablet Place 0.4 mg under the tongue every 5 (five) minutes as needed for chest pain.     [provider]  Omega-3 Fatty Acids (FISH OIL) 1000 MG CAPS Take 1,000 mg by mouth daily.    [provider]  omeprazole (PRILOSEC) 40 MG capsule Take 40 mg by mouth 2 (two) times daily.     [provider]  ondansetron (ZOFRAN) 4 MG tablet Take 4 mg by mouth every 8 (eight) hours as needed for nausea or vomiting. 08/11/20   [provider]  OZEMPIC, 1 MG/DOSE, 4 MG/3ML SOPN Inject 1 mg into the skin every Monday. 10/10/20   [provider]  rosuvastatin (CRESTOR) 40 MG tablet Take 40 mg by mouth daily.     [provider]  senna (SENOKOT) 8.6 MG TABS tablet Take 1 tablet by mouth daily.     [provider]  sucralfate (CARAFATE) 1 g tablet Take 1 g by mouth 4 (four) times daily.  04/21/18   [provider]  tamsulosin (FLOMAX) 0.4 MG CAPS capsule Take 0.4 mg by mouth daily. 08/02/19   [provider]  venlafaxine (EFFEXOR) 37.5 MG tablet Take 37.5 mg by mouth 2 (two) times daily. Take with 75 mg in am    [provider]  venlafaxine XR (EFFEXOR-XR) 75 MG 24 hr capsule Take 1 capsule (75 mg total) by mouth daily with  breakfast. 11/09/17   McNew, Ileene Hutchinson, MD  ziprasidone (GEODON) 40 MG capsule Take 40 mg by mouth daily.    [provider]    Allergies as of 05/02/2021 - Review Complete 04/25/2021  Allergen Reaction Noted   Trazodone and nefazodone  06/27/2016   Viread [tenofovir disoproxil]  12/15/2014   Etodolac Rash 06/27/2016    Family History  Problem Relation Age of Onset   Heart attack Mother    Diabetes Mellitus II Mother    Mental illness Mother    CAD Mother    Heart attack Father    CAD Father    Hypertension Father     Social History   Socioeconomic History   Marital status: Single    Spouse name: Not on file   Number of children: Not on file   Years of education: 11   Highest education level: GED or equivalent  Occupational History   Not on file  Tobacco Use   Smoking status: Never   Smokeless tobacco: Never  Vaping Use   Vaping Use: Never used  Substance and Sexual Activity   Alcohol use: No   Drug use: No    Comment: denies street drugs   Sexual activity: Never  Other Topics Concern   Not on file  Social History Narrative   Not on file   Social Determinants of Health   Financial Resource Strain: Not on file  Food Insecurity: Not on file  Transportation Needs: Not on file  Physical Activity: Not on file  Stress: Not on file  Social Connections: Not on file  Intimate Partner Violence: Not on file    Review of Systems: See HPI, otherwise negative ROS  Physical Exam: BP 122/63   Pulse (!) 114   Temp (!) 97.3 F (36.3 C) (Temporal)   Resp 14   Ht 5\' 7"  (1.702 m)   Wt 95.3 kg   SpO2 97%   BMI 32.89 kg/m  General:   Alert, cooperative in NAD Head:  Normocephalic and atraumatic. Respiratory:  Normal work of breathing. Cardiovascular:  RRR  Impression/Plan: Danny Cook. is here for cataract surgery.  Risks, benefits, limitations, and alternatives regarding cataract surgery have been reviewed with the patient.  Questions have  been answered.  All parties agreeable.   Galen Manila, MD  09/19/2021, 9:43 AM

## 2021-09-19 NOTE — Anesthesia Postprocedure Evaluation (Signed)
Anesthesia Post Note ? ?Patient: Danny Cook. ? ?Procedure(s) Performed: CATARACT EXTRACTION PHACO AND INTRAOCULAR LENS PLACEMENT ([facility]) LEFT DIABETIC (Left: Eye) ? ?Patient location during evaluation: PACU ?Anesthesia Type: MAC ?Level of consciousness: awake and alert, oriented and patient cooperative ?Pain management: pain level controlled ?Vital Signs Assessment: post-procedure vital signs reviewed and stable ?Respiratory status: spontaneous breathing, nonlabored ventilation and respiratory function stable ?Cardiovascular status: blood pressure returned to baseline and stable ?Postop Assessment: adequate PO intake ?Anesthetic complications: no ? ? ?No notable events documented. ? ? ?Last Vitals:  ?Vitals:  ? 09/19/21 0832 09/19/21 1020  ?BP: 122/63 135/81  ?Pulse: (!) 114 (!) 45  ?Resp: 14 16  ?Temp: (!) 36.3 ?C (!) 36.1 ?C  ?SpO2: 97% 97%  ?  ?Last Pain:  ?Vitals:  ? 09/19/21 1020  ?TempSrc: Temporal  ?PainSc: 0-No pain  ? ? ?  ?  ?  ?  ?  ?  ? ?Darrin Nipper ? ? ? ? ?

## 2021-09-19 NOTE — Anesthesia Preprocedure Evaluation (Signed)
Anesthesia Evaluation  ?Patient identified by MRN, date of birth, ID band ?Patient awake ? ? ? ?Reviewed: ?Allergy & Precautions, NPO status , Patient's Chart, lab work & pertinent test results ? ?History of Anesthesia Complications ?Negative for: history of anesthetic complications ? ?Airway ?Mallampati: III ? ? ?Neck ROM: Full ? ? ? Dental ? ?(+) Upper Dentures, Lower Dentures ?  ?Pulmonary ?sleep apnea ,  ?  ?Pulmonary exam normal ?breath sounds clear to auscultation ? ? ? ? ? ? Cardiovascular ?hypertension, + CAD (s/p MI) and +CHF  ?Normal cardiovascular exam ?Rhythm:Regular Rate:Normal ? ?ECG 07/16/21:  ?Sinus rhythm with 1st degree A-V block ?Otherwise normal ECG ? ?Holter monitor placed 01/23/21 for 7 days revealed predominant sinus rhythm with occasional PACs with infrequent dropped beats due to Mobitz 2 second-degree AV block. There were occasional episodes of SVT, the longest lasting 6 minutes and 49 seconds at a rate of 166 bpm. There were no diary entries and the patient reports being asymptomatic. ?  ?Neuro/Psych ?PSYCHIATRIC DISORDERS Anxiety Depression Schizophrenia TIA  ? GI/Hepatic ?GERD  ,  ?Endo/Other  ?diabetes, Type 2Obesity  ? Renal/GU ?Renal disease (stage II CKD)  ? ?  ?Musculoskeletal ? ?(+) Arthritis ,  ? Abdominal ?  ?Peds ? Hematology ? ?(+) HIV, Kaposi's sarcoma   ?Anesthesia Other Findings ? ? Reproductive/Obstetrics ? ?  ? ? ? ? ? ? ? ? ? ? ? ? ? ?  ?  ? ? ? ? ? ? ? ? ?Anesthesia Physical ? ?Anesthesia Plan ? ?ASA: 3 ? ?Anesthesia Plan: MAC  ? ?Post-op Pain Management:   ? ?Induction: Intravenous ? ?PONV Risk Score and Plan: 1 and Treatment may vary due to age or medical condition and TIVA ? ?Airway Management Planned: Natural Airway ? ?Additional Equipment:  ? ?Intra-op Plan:  ? ?Post-operative Plan:  ? ?Informed Consent: I have reviewed the patients History and Physical, chart, labs and discussed the procedure including the risks, benefits and  alternatives for the proposed anesthesia with the patient or authorized representative who has indicated his/her understanding and acceptance.  ? ? ? ? ? ?Plan Discussed with: CRNA ? ?Anesthesia Plan Comments: (LMA/GETA backup discussed.  Patient consented for risks of anesthesia including but not limited to:  ?- adverse reactions to medications ?- damage to eyes, teeth, lips or other oral mucosa ?- nerve damage due to positioning  ?- sore throat or hoarseness ?- damage to heart, brain, nerves, lungs, other parts of body or loss of life ? ?Informed patient about role of CRNA in peri- and intra-operative care.  Patient voiced understanding.)  ? ? ? ? ? ? ?Anesthesia Quick Evaluation ? ?

## 2021-09-19 NOTE — Op Note (Signed)
PREOPERATIVE DIAGNOSIS:  Nuclear sclerotic cataract of the left eye. ?  ?POSTOPERATIVE DIAGNOSIS:  Nuclear sclerotic cataract of the left eye. ?  ?OPERATIVE PROCEDURE:ORPROCALL@ ?  ?SURGEON:  Birder Robson, MD. ?  ?ANESTHESIA: ? ?Anesthesiologist: Darrin Nipper, MD ?CRNA: Beverely Low, CRNA ? ?1.      Managed anesthesia care. ?2.     0.35m of Shugarcaine was instilled following the paracentesis ?  ?COMPLICATIONS:  None. ?  ?TECHNIQUE:   Stop and chop ?  ?DESCRIPTION OF PROCEDURE:  The patient was examined and consented in the preoperative holding area where the aforementioned topical anesthesia was applied to the left eye and then brought back to the Operating Room where the left eye was prepped and draped in the usual sterile ophthalmic fashion and a lid speculum was placed. A paracentesis was created with the side port blade and the anterior chamber was filled with viscoelastic. A near clear corneal incision was performed with the steel keratome. A continuous curvilinear capsulorrhexis was performed with a cystotome followed by the capsulorrhexis forceps. Hydrodissection and hydrodelineation were carried out with BSS on a blunt cannula. The lens was removed in a stop and chop  technique and the remaining cortical material was removed with the irrigation-aspiration handpiece. The capsular bag was inflated with viscoelastic and the Technis [id number] lens was placed in the capsular bag without complication. The remaining viscoelastic was removed from the eye with the irrigation-aspiration handpiece. The wounds were hydrated. The anterior chamber was flushed with BSS and the eye was inflated to physiologic pressure. 0.139mVigamox was placed in the anterior chamber. The wounds were found to be water tight. The eye was dressed with Combigan. The patient was given protective glasses to wear throughout the day and a shield with which to sleep tonight. The patient was also given drops with which to begin a drop regimen  today and will follow-up with me in one day. ?Implant Name Type Inv. Item Serial No. Manufacturer Lot No. LRB No. Used Action  ?LENS IOL TECNIS EYHANCE 21.5 - [id number] Lens LENS IOL TECNIS EYHANCE 21.5 [id number]  Left 1 Implanted  ?  ?Procedure(s) with comments: ?CATARACT EXTRACTION PHACO AND INTRAOCULAR LENS PLACEMENT (IOC) LEFT DIABETIC (Left) - 6.73 ?0:49.1 ? ?Electronically signed: WiBirder Robson/23/2023 12:45 PM ? ?

## 2021-09-19 NOTE — Transfer of Care (Signed)
Immediate Anesthesia Transfer of Care Note ? ?Patient: Danny Cook. ? ?Procedure(s) Performed: CATARACT EXTRACTION PHACO AND INTRAOCULAR LENS PLACEMENT (IOC) LEFT DIABETIC (Left: Eye) ? ?Patient Location: PACU ? ?Anesthesia Type:MAC ? ?Level of Consciousness: awake and alert  ? ?Airway & Oxygen Therapy: Patient Spontanous Breathing ? ?Post-op Assessment: Report given to RN and Post -op Vital signs reviewed and stable ? ?Post vital signs: Reviewed and stable ? ?Last Vitals:  ?Vitals Value Taken Time  ?BP    ?Temp    ?Pulse    ?Resp    ?SpO2    ? ? ?Last Pain:  ?Vitals:  ? 09/19/21 0832  ?TempSrc: Temporal  ?PainSc: 0-No pain  ?   ? ?  ? ?Complications: No notable events documented. ?

## 2021-11-17 ENCOUNTER — Emergency Department: Payer: Medicare Other

## 2021-11-17 ENCOUNTER — Encounter: Payer: Self-pay | Admitting: Emergency Medicine

## 2021-11-17 ENCOUNTER — Other Ambulatory Visit: Payer: Self-pay

## 2021-11-17 ENCOUNTER — Emergency Department
Admission: EM | Admit: 2021-11-17 | Discharge: 2021-11-17 | Disposition: A | Discharge status: Home / Self Care | Payer: Medicare Other | Attending: Emergency Medicine | Admitting: Emergency Medicine

## 2021-11-17 DIAGNOSIS — I110 Hypertensive heart disease with heart failure: Secondary | ICD-10-CM | POA: Diagnosis not present

## 2021-11-17 DIAGNOSIS — R1310 Dysphagia, unspecified: Secondary | ICD-10-CM | POA: Insufficient documentation

## 2021-11-17 DIAGNOSIS — E119 Type 2 diabetes mellitus without complications: Secondary | ICD-10-CM | POA: Insufficient documentation

## 2021-11-17 DIAGNOSIS — I509 Heart failure, unspecified: Secondary | ICD-10-CM | POA: Insufficient documentation

## 2021-11-17 DIAGNOSIS — R0989 Other specified symptoms and signs involving the circulatory and respiratory systems: Secondary | ICD-10-CM

## 2021-11-17 NOTE — ED Triage Notes (Signed)
Pt via POV from home. Pt states that during breakfast this AM, he was eating a Red Hot hot dog and felt like the food may have gotten stuck in his throat. Denies any SOB. Denies drooling Denies any pain. Denies vomiting. Pt states that it painful to swallow but is still able to do so. Pt is A&Ox4 and NAD

## 2021-11-17 NOTE — ED Provider Notes (Signed)
[facility] Provider Note   Event Date/Time   First MD Initiated Contact with Patient 11/17/21 1212     (approximate) History  Possible Food Bolus  HPI Danny Cook. is a 61 y.o. male with a past medical history of hypertension, heart failure, diabetes, and schizoaffective disorder who presents for possible food bolus.  Patient states that he was eating a red hot hot dog earlier today and feels that it may have gotten stuck in his throat.  Patient denies any drooling, significant pain, vomiting, or inability to handle his secretions.  Patient does state that it has been somewhat painful to swallow with a sensation that there is a lump in his throat.  Patient states that this sensation has been present over the last month to a varying degree and feels that it began after he was told that he has been having multiple "mini strokes".  Patient also states that over this past month he is seeing difficulty in swallowing thin liquids such as water or coffee and has had multiple choking spells. Physical Exam  Triage Vital Signs: ED Triage Vitals  Enc Vitals Group     BP 11/17/21 1202 (!) 150/97     Pulse Rate 11/17/21 1202 100     Resp 11/17/21 1202 20     Temp --      Temp src --      SpO2 11/17/21 1202 95 %     Weight 11/17/21 1200 205 lb (93 kg)     Height 11/17/21 1200 '5\' 7"'$  (1.702 m)     Head Circumference --      Peak Flow --      Pain Score 11/17/21 1159 0     Pain Loc --      Pain Edu? --      Excl. in [place]? --    Most recent vital signs: Vitals:   11/17/21 1202  BP: (!) 150/97  Pulse: 100  Resp: 20  SpO2: 95%   General: Awake, oriented x4. CV:  Good peripheral perfusion.  Resp:  Normal effort.  Abd:  No distention.  Other:  Middle-aged Caucasian male laying in bed in no distress.  Currently swallowing secretions without difficulty ED Results / Procedures / Treatments  RADIOLOGY ED MD interpretation: 2 view chest x-ray interpreted by me  shows no evidence of acute abnormalities including no pneumonia, pneumothorax, or widened mediastinum.  No radiopaque foreign body -Agree with radiology assessment Official radiology report(s): DG Chest 2 View  Result Date: 11/17/2021 CLINICAL DATA:  Patient reports that food may have gotten stuck in his throat. EXAM: CHEST - 2 VIEW COMPARISON:  Chest radiograph dated 07/29/2020. FINDINGS: The heart size and mediastinal contours are within normal limits. Both lungs are clear. Degenerative changes are seen in the spine. No radiopaque foreign body. IMPRESSION: No active cardiopulmonary disease.  No radiopaque foreign body. Electronically Signed   By: Zerita Boers M.D.   On: 11/17/2021 12:33   PROCEDURES: Critical Care performed: No Procedures MEDICATIONS ORDERED IN ED: Medications - No data to display IMPRESSION / MDM / [place] / ED COURSE  I reviewed the triage vital signs and the nursing notes.                             Differential diagnosis includes, but is not limited to, esophageal food impaction, globus sensation, dysphagia, esophageal dysmotility  Patient is a 61 year old male with  the above-stated past medical history and history of present illness.  Chest x-ray does not show a radiopaque foreign bodies.  I have less concern for esophageal impaction as patient is handling his secretions without difficulty and has had no vomiting since this ingestion.  Discussed at length patient's possibility of dysphagia given his recent "mini strokes" and need for possible swallow study.  Patient was encouraged to consume more thickened liquids as well as avoid any large boluses of food before following up with his primary care physician.  Patient expressed understanding and all questions were answered prior to discharge.  Dispo: Discharge home with PCP follow-up    FINAL CLINICAL IMPRESSION(S) / ED DIAGNOSES   Final diagnoses:  Globus sensation  Dysphagia, unspecified type   Rx /  DC Orders   ED Discharge Orders     None      Note:  This document was prepared using Dragon voice recognition software and may include unintentional dictation errors.   Naaman Plummer, MD 11/17/21 [phone]

## 2021-11-20 ENCOUNTER — Other Ambulatory Visit: Payer: Self-pay | Admitting: Infectious Diseases

## 2021-11-20 DIAGNOSIS — R1310 Dysphagia, unspecified: Secondary | ICD-10-CM

## 2021-11-29 ENCOUNTER — Ambulatory Visit
Admission: RE | Admit: 2021-11-29 | Discharge: 2021-11-29 | Disposition: A | Discharge status: Home / Self Care | Payer: Medicare Other | Attending: Physician Assistant | Admitting: Physician Assistant

## 2021-11-29 ENCOUNTER — Ambulatory Visit (INDEPENDENT_AMBULATORY_CARE_PROVIDER_SITE_OTHER): Payer: Medicare Other | Admitting: Physician Assistant

## 2021-11-29 ENCOUNTER — Encounter: Payer: Self-pay | Admitting: Physician Assistant

## 2021-11-29 ENCOUNTER — Ambulatory Visit
Admission: RE | Admit: 2021-11-29 | Discharge: 2021-11-29 | Disposition: A | Discharge status: Home / Self Care | Payer: Medicare Other | Source: Ambulatory Visit | Attending: Physician Assistant | Admitting: Physician Assistant

## 2021-11-29 VITALS — BP 86/59 | HR 58 | Ht 67.0 in

## 2021-11-29 DIAGNOSIS — N200 Calculus of kidney: Secondary | ICD-10-CM

## 2021-11-29 DIAGNOSIS — M549 Dorsalgia, unspecified: Secondary | ICD-10-CM | POA: Diagnosis not present

## 2021-11-29 DIAGNOSIS — R310 Gross hematuria: Secondary | ICD-10-CM | POA: Diagnosis not present

## 2021-11-29 DIAGNOSIS — Z87442 Personal history of urinary calculi: Secondary | ICD-10-CM | POA: Diagnosis not present

## 2021-11-29 LAB — URINALYSIS, COMPLETE
Bilirubin, UA: NEGATIVE
Glucose, UA: NEGATIVE
Ketones, UA: NEGATIVE
Leukocytes,UA: NEGATIVE
Nitrite, UA: NEGATIVE
RBC, UA: NEGATIVE
Specific Gravity, UA: 1.02 (ref 1.005–1.030)
Urobilinogen, Ur: 0.2 mg/dL (ref 0.2–1.0)
pH, UA: 6.5 (ref 5.0–7.5)

## 2021-11-29 LAB — MICROSCOPIC EXAMINATION: Bacteria, UA: NONE SEEN

## 2021-11-29 NOTE — Progress Notes (Signed)
11/29/2021 2:18 PM   Danny Cook. March 07, 1961 [id number]  CC: Chief Complaint  Patient presents with   Nephrolithiasis   HPI: Danny Cook. is a 61 y.o. male with PMH intermittent painless gross hematuria and nephrolithiasis s/p ESWL in 2022 who presents today for annual follow-up.   Today he reports no episodes of acute flank pain or gross hematuria in the past year.  He has back pain at baseline that has been progressively worsening.  He notes some mild urinary intermittency when he drinks orange juice.  He was able to move into his own place in the past year and overall is doing quite well.  KUB today with no radiopaque stones.  In-office UA today positive for 3+ protein; urine microscopy with granular casts.   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[place])    Myocardial infarction ([place])    Paranoid disorder ([place])    Pneumonia    Schizophrenia, paranoid ([place])    Sleep apnea    Stroke ([place])    mini TIA   TIA (transient ischemic attack)     Surgical History: Past Surgical History:  Procedure Laterality Date   CARDIAC CATHETERIZATION     CATARACT EXTRACTION W/PHACO Right 04/25/2021   Procedure: CATARACT EXTRACTION PHACO AND INTRAOCULAR LENS PLACEMENT ([employer]) RIGHT DIABETIC;  Surgeon: Birder Robson, MD;  Location: [facility] ORS;  Service: Ophthalmology;  Laterality: Right;  3.32 0:23.0   CATARACT EXTRACTION W/PHACO Left 09/19/2021   Procedure: CATARACT EXTRACTION PHACO AND INTRAOCULAR LENS PLACEMENT ([place]) LEFT DIABETIC;  Surgeon:  Birder Robson, MD;  Location: [facility] ORS;  Service: Ophthalmology;  Laterality: Left;  6.73 0:49.1   COLONOSCOPY WITH PROPOFOL N/A 09/03/2015   Procedure: COLONOSCOPY WITH PROPOFOL;  Surgeon: Lollie Sails, MD;  [facility];  Service: Endoscopy;  Laterality: N/A;   COLONOSCOPY WITH PROPOFOL N/A 09/17/2020   Procedure: COLONOSCOPY WITH PROPOFOL;  Surgeon: Lesly Rubenstein, MD;  Location: [facility] ENDOSCOPY;  Service: Endoscopy;  Laterality: N/A;  PREFERS 9AM OR LATER DUE TO TRANSPORTATION   DG TEETH FULL     ESOPHAGOGASTRODUODENOSCOPY (EGD) WITH PROPOFOL N/A 07/01/2016   Dr. [name], [facility]. abnormal esophageal motility, suspicious for presbyesophagus, bile gastritis s/p biopsy, non-bleeding erosive gastropathy s/p biopsy, normal duodenum, path with negative H.pylori and +chronic active gastritis   ESOPHAGOGASTRODUODENOSCOPY (EGD) WITH PROPOFOL N/A 09/17/2020   Procedure: ESOPHAGOGASTRODUODENOSCOPY (EGD) WITH PROPOFOL;  Surgeon: Lesly Rubenstein, MD;  Location: [facility] ENDOSCOPY;  Service: Endoscopy;  Laterality: N/A;   EXTRACORPOREAL SHOCK WAVE LITHOTRIPSY Right 11/01/2020   Procedure: EXTRACORPOREAL SHOCK WAVE LITHOTRIPSY (ESWL);  Surgeon: Abbie Sons, MD;  Location: [facility] ORS;  Service: Urology;  Laterality: Right;   LUMBAR LAMINECTOMY/DECOMPRESSION MICRODISCECTOMY N/A 07/15/2021   Procedure: L3-5 DECOMPRESSION;  Surgeon: Meade Maw, MD;  Location: [facility] ORS;  Service: Neurosurgery;  Laterality: N/A;    Home Medications:  Allergies as of 11/29/2021       Reactions   Trazodone And Nefazodone    Affects other meds that  they dont work   [employer] Disoproxil]    Damages kidneys   Etodolac Rash        Medication List        Accurate as of November 29, 2021  2:18 PM. If you have any questions, ask your nurse or doctor.          acetaminophen 650 MG CR tablet Commonly known as: TYLENOL Take 650 mg by mouth every 8 (eight) hours as needed for pain.    albuterol 108 (90 Base) MCG/ACT inhaler Commonly known as: VENTOLIN HFA Inhale 2 puffs into the lungs every 6 (six) hours as needed for wheezing or shortness of breath.   aspirin 325 MG tablet Take 325 mg by mouth daily.   benztropine 0.5 MG tablet Commonly known as: COGENTIN Take 1.5 mg by mouth daily.   bismuth subsalicylate 161 WR/60AV suspension Commonly known as: PEPTO BISMOL Take 15 mLs by mouth every 6 (six) hours as needed for indigestion.   brimonidine-timolol 0.2-0.5 % ophthalmic solution Commonly known as: COMBIGAN Place 1 drop into both eyes every 12 (twelve) hours.   butalbital-acetaminophen-caffeine 50-325-40 MG tablet Commonly known as: FIORICET Take 1 tablet by mouth every 4 (four) hours as needed for headache.   diazepam 5 MG tablet Commonly known as: VALIUM Take 2.5-5 mg by mouth See admin instructions. 5 mg in the morning, 2.5 mg midday, 5 mg in the evening   dicyclomine 10 MG capsule Commonly known as: BENTYL Take 10 mg by mouth 4 (four) times daily -  before meals and at bedtime.   divalproex 500 MG 24 hr tablet Commonly known as: DEPAKOTE ER Take 500 mg by mouth at bedtime.   docusate sodium 250 MG capsule Commonly known as: COLACE Take 250 mg by mouth 2 (two) times daily.   donepezil 10 MG tablet Commonly known as: ARICEPT Take 5 mg by mouth daily.   Fanapt 12 MG Tabs Generic drug: Iloperidone Take 12 mg by mouth 2 (two) times daily.   Fish Oil 1000 MG Caps Take 1,000 mg by mouth daily.   Fluocinolone Acetonide 0.01 % Oil Place 2-4 drops in ear(s) every other day. At bedtime for dry skin   fluticasone 220 MCG/ACT inhaler Commonly known as: Flovent HFA Inhale 2 puffs into the lungs 2 (two) times daily. Rinse out mouth afterwards   gabapentin 600 MG tablet Commonly known as: NEURONTIN Take 800 mg by mouth 3 (three) times daily.   gemfibrozil 600 MG tablet Commonly known as: LOPID Take 600 mg by mouth 2 (two) times daily before a  meal.   glimepiride 4 MG tablet Commonly known as: AMARYL Take 4 mg by mouth daily with breakfast.   glycopyrrolate 2 MG tablet Commonly known as: ROBINUL Take 0.5 mg by mouth 2 (two) times daily.   guaiFENesin 600 MG 12 hr tablet Commonly known as: MUCINEX Take 600 mg by mouth 2 (two) times daily.   HumaLOG KwikPen 100 UNIT/ML KwikPen Generic drug: insulin lispro Inject 6 Units into the skin 3 (three) times daily. Per sliding scale   losartan 25 MG tablet Commonly known as: COZAAR Take 25 mg by mouth daily.   memantine 10 MG tablet Commonly known as: NAMENDA Take 10 mg by mouth 2 (two) times daily.   metFORMIN 1000 MG tablet Commonly known as: GLUCOPHAGE Take 1,000 mg by mouth 2 (two) times daily with a meal.   methocarbamol 500 MG tablet Commonly known as: Robaxin Take 1 tablet (500 mg total) by mouth  4 (four) times daily.   mirtazapine 30 MG tablet Commonly known as: REMERON Take 30 mg by mouth at bedtime.   mometasone 0.1 % ointment Commonly known as: ELOCON Apply 1 application topically See admin instructions. Apply 4 drops to ear canal at bedtime every other night for itching and dry skin.   nitroGLYCERIN 0.4 MG SL tablet Commonly known as: NITROSTAT Place 0.4 mg under the tongue every 5 (five) minutes as needed for chest pain.   omeprazole 40 MG capsule Commonly known as: PRILOSEC Take 40 mg by mouth 2 (two) times daily.   ondansetron 4 MG tablet Commonly known as: ZOFRAN Take 4 mg by mouth every 8 (eight) hours as needed for nausea or vomiting.   Ozempic (1 MG/DOSE) 4 MG/3ML Sopn Generic drug: Semaglutide (1 MG/DOSE) Inject 1 mg into the skin every Monday.   rosuvastatin 40 MG tablet Commonly known as: CRESTOR Take 40 mg by mouth daily.   senna 8.6 MG Tabs tablet Commonly known as: SENOKOT Take 1 tablet by mouth daily.   sucralfate 1 g tablet Commonly known as: CARAFATE Take 1 g by mouth 4 (four) times daily.   tamsulosin 0.4 MG Caps  capsule Commonly known as: FLOMAX Take 0.4 mg by mouth daily.   Tyler Aas FlexTouch 200 UNIT/ML FlexTouch Pen Generic drug: insulin degludec Inject 10 Units into the skin daily. What changed: how much to take   Triumeq 600-50-300 MG tablet Generic drug: abacavir-dolutegravir-lamiVUDine Take 1 tablet by mouth at bedtime.   venlafaxine 37.5 MG tablet Commonly known as: EFFEXOR Take 37.5 mg by mouth 2 (two) times daily. Take with 75 mg in am   venlafaxine XR 75 MG 24 hr capsule Commonly known as: EFFEXOR-XR Take 1 capsule (75 mg total) by mouth daily with breakfast.   ziprasidone 40 MG capsule Commonly known as: GEODON Take 40 mg by mouth daily.        Allergies:  Allergies  Allergen Reactions   Trazodone And Nefazodone     Affects other meds that they dont work   [employer] Disoproxil]     Damages kidneys   Etodolac Rash    Family History: Family History  Problem Relation Age of Onset   Heart attack Mother    Diabetes Mellitus II Mother    Mental illness Mother    CAD Mother    Heart attack Father    CAD Father    Hypertension Father     Social History:   reports that he has never smoked. He has never used smokeless tobacco. He reports that he does not drink alcohol and does not use drugs.  Physical Exam: BP (!) 86/59   Pulse (!) 58   Ht '5\' 7"'$  (1.702 m)   BMI 32.11 kg/m   Constitutional:  Alert and oriented, no acute distress, nontoxic appearing HEENT: [place], AT Cardiovascular: No clubbing, cyanosis, or edema Respiratory: Normal respiratory effort, no increased work of breathing Skin: No rashes, bruises or suspicious lesions Neurologic: Grossly intact, no focal deficits, moving all 4 extremities Psychiatric: Normal mood and affect  Laboratory Data: Results for orders placed or performed in visit on 11/29/21  Microscopic Examination   Urine  Result Value Ref Range   WBC, UA 0-5 0 - 5 /hpf   RBC 0-2 0 - 2 /hpf   Epithelial Cells (non renal)  0-10 0 - 10 /hpf   Casts Present (A) None seen /lpf   Cast Type Granular casts (A) N/A   Bacteria, UA None seen  None seen/Few  Urinalysis, Complete  Result Value Ref Range   Specific Gravity, UA 1.020 1.005 - 1.030   pH, UA 6.5 5.0 - 7.5   Color, UA Yellow Yellow   Appearance Ur Clear Clear   Leukocytes,UA Negative Negative   Protein,UA 3+ (A) Negative/Trace   Glucose, UA Negative Negative   Ketones, UA Negative Negative   RBC, UA Negative Negative   Bilirubin, UA Negative Negative   Urobilinogen, Ur 0.2 0.2 - 1.0 mg/dL   Nitrite, UA Negative Negative   Microscopic Examination See below:    Pertinent Imaging: KUB, 11/29/2021: CLINICAL DATA:  Follow-up kidney stone   EXAM: ABDOMEN - 1 VIEW   COMPARISON:  11/15/2020   FINDINGS: Nonobstructed gas pattern with moderate stool. Phleboliths in the pelvis. No radiopaque calculi.   IMPRESSION: Negative.     Electronically Signed   By: Donavan Foil M.D.   On: 12/01/2021 20:46  I personally reviewed the images referenced above and note no radiopaque urolithiasis.  Assessment & Plan:   1. Nephrolithiasis Asymptomatic, no radiopaque stones on KUB today.  Patient prefers to keep annual follow-up. - Abdomen 1 view (KUB); Future  2. Gross hematuria Suspect associated with his prior kidney stone which is now been treated.  No microscopic hematuria today and he denies gross hematuria in the past year.  We will continue to monitor. - Urinalysis, Complete  Return in about 1 year (around 11/30/2022) for Annual stone visit with KUB prior and UA.  Debroah Loop, PA-C  [facility] [address], [place], [place] [address] [phone]

## 2021-12-03 ENCOUNTER — Emergency Department
Admission: EM | Admit: 2021-12-03 | Discharge: 2021-12-03 | Disposition: A | Discharge status: Home / Self Care | Payer: Medicare Other | Attending: Emergency Medicine | Admitting: Emergency Medicine

## 2021-12-03 ENCOUNTER — Other Ambulatory Visit: Payer: Self-pay

## 2021-12-03 ENCOUNTER — Emergency Department: Payer: Medicare Other

## 2021-12-03 DIAGNOSIS — I440 Atrioventricular block, first degree: Secondary | ICD-10-CM | POA: Insufficient documentation

## 2021-12-03 DIAGNOSIS — I5032 Chronic diastolic (congestive) heart failure: Secondary | ICD-10-CM | POA: Diagnosis not present

## 2021-12-03 DIAGNOSIS — R0789 Other chest pain: Secondary | ICD-10-CM | POA: Insufficient documentation

## 2021-12-03 DIAGNOSIS — Z21 Asymptomatic human immunodeficiency virus [HIV] infection status: Secondary | ICD-10-CM | POA: Diagnosis not present

## 2021-12-03 DIAGNOSIS — R0602 Shortness of breath: Secondary | ICD-10-CM | POA: Diagnosis not present

## 2021-12-03 DIAGNOSIS — R600 Localized edema: Secondary | ICD-10-CM | POA: Diagnosis not present

## 2021-12-03 DIAGNOSIS — R079 Chest pain, unspecified: Secondary | ICD-10-CM

## 2021-12-03 DIAGNOSIS — R001 Bradycardia, unspecified: Secondary | ICD-10-CM | POA: Insufficient documentation

## 2021-12-03 DIAGNOSIS — E11649 Type 2 diabetes mellitus with hypoglycemia without coma: Secondary | ICD-10-CM | POA: Diagnosis not present

## 2021-12-03 DIAGNOSIS — R42 Dizziness and giddiness: Secondary | ICD-10-CM | POA: Insufficient documentation

## 2021-12-03 DIAGNOSIS — I130 Hypertensive heart and chronic kidney disease with heart failure and stage 1 through stage 4 chronic kidney disease, or unspecified chronic kidney disease: Secondary | ICD-10-CM | POA: Insufficient documentation

## 2021-12-03 DIAGNOSIS — E1122 Type 2 diabetes mellitus with diabetic chronic kidney disease: Secondary | ICD-10-CM | POA: Insufficient documentation

## 2021-12-03 DIAGNOSIS — N1831 Chronic kidney disease, stage 3a: Secondary | ICD-10-CM | POA: Insufficient documentation

## 2021-12-03 DIAGNOSIS — E162 Hypoglycemia, unspecified: Secondary | ICD-10-CM

## 2021-12-03 LAB — CBC
HCT: 37.8 % — ABNORMAL LOW (ref 39.0–52.0)
Hemoglobin: 12.4 g/dL — ABNORMAL LOW (ref 13.0–17.0)
MCH: 30.7 pg (ref 26.0–34.0)
MCHC: 32.8 g/dL (ref 30.0–36.0)
MCV: 93.6 fL (ref 80.0–100.0)
Platelets: 198 10*3/uL (ref 150–400)
RBC: 4.04 MIL/uL — ABNORMAL LOW (ref 4.22–5.81)
RDW: 13.3 % (ref 11.5–15.5)
WBC: 6.6 10*3/uL (ref 4.0–10.5)
nRBC: 0 % (ref 0.0–0.2)

## 2021-12-03 LAB — CBG MONITORING, ED
Glucose-Capillary: 46 mg/dL — ABNORMAL LOW (ref 70–99)
Glucose-Capillary: 180 mg/dL — ABNORMAL HIGH (ref 70–99)

## 2021-12-03 LAB — BASIC METABOLIC PANEL
Anion gap: 10 (ref 5–15)
BUN: 13 mg/dL (ref 8–23)
CO2: 25 mmol/L (ref 22–32)
Calcium: 9.3 mg/dL (ref 8.9–10.3)
Chloride: 108 mmol/L (ref 98–111)
Creatinine, Ser: 1.02 mg/dL (ref 0.61–1.24)
GFR, Estimated: >60 mL/min (ref >60)
Glucose, Bld: 70 mg/dL (ref 70–99)
Potassium: 4 mmol/L (ref 3.5–5.1)
Sodium: 143 mmol/L (ref 135–145)

## 2021-12-03 LAB — TROPONIN I (HIGH SENSITIVITY)
Troponin I (High Sensitivity): 8 ng/L (ref <18)
Troponin I (High Sensitivity): 9 ng/L (ref <18)

## 2021-12-03 NOTE — ED Triage Notes (Signed)
First Nurse Note:  Arrives via [facility].  C/O CP and dizziness today since 1300.  Per report took 1 NTG, pain resolved.  Monitor noted Afib, 50-60 no known history.  Otherwise VS wnl.  Hx MI, HIV

## 2021-12-03 NOTE — ED Notes (Signed)
See triage note  presents via EMS with some chest pain  states he had an episode about 12 n today  pt is pain free on arrival   resp even and non labored

## 2021-12-03 NOTE — Discharge Instructions (Signed)
Your EKG and blood work were reassuring today. If you have recurrent symptoms, such as chest pain that is not improving please return to the emergency department.

## 2021-12-03 NOTE — ED Notes (Signed)
FSBS  46  pt given some OJ,crackers and g'ale

## 2021-12-03 NOTE — ED Provider Notes (Signed)
[facility] Provider Note    Event Date/Time   First MD Initiated Contact with Patient 12/03/21 1616     (approximate)   History   Chest Pain   HPI  Danny Cook. is a 61 y.o. male past medical history of CHF, prior MI, HIV hypertension diabetes GERD who presents with an episode of dizziness and chest pain.  Around 12 PM patient was just sitting down when he had acute onset of feeling lightheaded short of breath and had sensation of his heart beating irregularly which was associate with some pain.  He cannot describe the quality of the pain.  Denies history of similar.  He took a nitroglycerin and after 15 to 20 seconds his symptoms completely resolved.  Currently feels back to baseline denies any shortness of breath nausea vomiting abdominal pain fevers chills.  Denies lower extremity edema.  No recent illnesses.    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[place])    Myocardial infarction ([place])    Paranoid disorder ([place])    Pneumonia    Schizophrenia, paranoid ([place])    Sleep apnea    Stroke ([place])    mini TIA   TIA (transient ischemic attack)     Patient Active Problem List   Diagnosis Date Noted   Abdominal pain 07/30/2020   Choledocholithiasis 07/30/2020   Hypoglycemia 07/30/2020   Dehydration 07/30/2020   Hyperlipidemia 07/30/2020   Acute renal failure superimposed on stage 3a chronic kidney disease ([place]) 07/30/2020   Acute cholecystitis 07/29/2020   Sepsis ([place]) 08/15/2019   NSTEMI (non-ST elevated myocardial infarction)  ([place]) 06/07/2018   Lymphedema 05/12/2018   Pleural effusion    FUO (fever of unknown origin)    MGUS (monoclonal gammopathy of unknown significance)    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   Healthcare associated bacterial pneumonia 04/25/2018   Community acquired pneumonia 04/12/2018   Pneumonia 04/12/2018   AKI (acute kidney injury) ([place]) 03/23/2018   Pericarditis with effusion 03/23/2018   Schizoaffective disorder, depressive type ([place]) 11/04/2017   HIV positive ([place]) 11/04/2017   Orthostatic dizziness 07/31/2017   Chronic diastolic heart failure ([place]) 07/29/2017   HTN (hypertension) 07/29/2017   Diabetes ([place]) 07/29/2017   Schizophrenia ([place]) 07/29/2017     Physical Exam  Triage Vital Signs: ED Triage Vitals  Enc Vitals Group     BP 12/03/21 1507 139/79     Pulse Rate 12/03/21 1507 (!) 53     Resp 12/03/21 1507 17     Temp 12/03/21 1507 98 F (36.7 C)     Temp Source 12/03/21 1507 Oral     SpO2 12/03/21 1507 96 %     Weight 12/03/21 1504 208 lb (94.3 kg)     Height 12/03/21 1504 '5\' 7"'$  (1.702 m)     Head Circumference --      Peak Flow --      Pain Score 12/03/21 1504 0     Pain Loc --  Pain Edu? --      Excl. in [place]? --     Most recent vital signs: Vitals:   12/03/21 1623 12/03/21 1910  BP: 139/72 (!) 160/70  Pulse: 60 88  Resp: 16 15  Temp:  98 F (36.7 C)  SpO2: 96% 99%     General: Awake, no distress.  CV:  Good peripheral perfusion.  Trace pitting edema bilateral lower extremities Resp:  Normal effort.  Lungs are clear Abd:  No distention.  Neuro:             Awake, Alert, Oriented x 3  Other:     ED Results / Procedures / Treatments  Labs (all labs ordered are listed, but only abnormal results are displayed) Labs Reviewed  CBC - Abnormal; Notable for the following components:      Result Value   RBC 4.04 (*)    Hemoglobin 12.4 (*)    HCT 37.8 (*)    All other components within normal limits  CBG MONITORING, ED -  Abnormal; Notable for the following components:   Glucose-Capillary 46 (*)    All other components within normal limits  CBG MONITORING, ED - Abnormal; Notable for the following components:   Glucose-Capillary 180 (*)    All other components within normal limits  BASIC METABOLIC PANEL  TROPONIN I (HIGH SENSITIVITY)  TROPONIN I (HIGH SENSITIVITY)     EKG  EKG reviewed interpreted myself shows first-degree AV block, normal sinus rhythm no acute ischemic changes   RADIOLOGY I reviewed and interpreted the CXR which does not show any acute cardiopulmonary process    PROCEDURES:  Critical Care performed: No  Procedures   MEDICATIONS ORDERED IN ED: Medications - No data to display   IMPRESSION / MDM / [place] / ED COURSE  I reviewed the triage vital signs and the nursing notes.                              Patient's presentation is most consistent with acute presentation with potential threat to life or bodily function.  Differential diagnosis includes, but is not limited to, ACS, vasovagal episode, less likey PE dissection    Patient is a 61 year old male does have prior history of CHF and NSTEMI who presents with an episode of palpitations lightheadedness and chest pain.  Occurred around noon while he was sitting down there is no exacerbating factor lasted just about 15 to 20 seconds.  He primarily endorses significant lightheadedness as well as some tightness in his chest and shortness of breath.  Had not stood up apparently he was able to take a nitroglycerin and the episode went away within 15 to 20 seconds and he has since been asymptomatic.  Denies history of similar.  He does have sinus bradycardia on his EKG with a first-degree AV block.  Troponins x2 are negative.  I am reassured that patient has no ongoing symptoms and this only lasted several seconds.  Patient's CBC and BMP are reassuring.  His BMP blood glucose was 70.  It was rechecked and it was in the  42s.  Patient was awake and alert and oriented was given carbohydrates to eat.  He ate multiple graham crackers and peanut butter.  He takes insulin metformin and glimepiride.  Tells me he did eat lunch but did not eat after this and normally would snack throughout the day to prevent hypoglycemia.  He did feel symptomatic prior  to the low sugar in the ED.  His sugar about an hour and a half after the hypoglycemia is 180.  I suspect that his hypoglycemia was in setting of not eating and since there is an identifiable cause I think it is appropriate that he is discharged.  I did recommend that he have a protein containing meal when he goes home.  He assures me that he checks his sugar frequently and can manage this at home.  Clinical Course as of 12/03/21 1913  Tue Dec 03, 2021  1845 Glucose-Capillary(!): 180 [KM]    Clinical Course User Index [KM] Rada Hay, MD     FINAL CLINICAL IMPRESSION(S) / ED DIAGNOSES   Final diagnoses:  Chest pain, unspecified type  Hypoglycemia     Rx / DC Orders   ED Discharge Orders     None        Note:  This document was prepared using Dragon voice recognition software and may include unintentional dictation errors.   Rada Hay, MD 12/03/21 [phone]

## 2021-12-03 NOTE — ED Triage Notes (Signed)
Pt comes into the ed VIA EMS from home with c/o central chest pain/pressure today with nausea, dizziness and SOB lasting just a couple minutes, states he took 1 nitro SL. Pt is a/ox4 on arrival with a steady gait

## 2021-12-07 NOTE — Progress Notes (Unsigned)
Patient ID: Danny Fairy., male    DOB: 02/12/1961, 61 y.o.   MRN: [id number]  HPI  Danny Cook is a 61 y/o male with a history of DM, hyperlipidemia, HTN, CKD, HIV, schizophrenia, obstructive sleep apnea, anxiety, osteoarthritis and chronic heart failure.   Echo report from 07/25/20 reviewed and showed an EF of 55-60% along with mild LVH. Echo report from 06/08/18 reviewed and showed an EF of 55-60%. Echo report from 03/25/18 reviewed and showed an EF of 55-60%. Echo report from 07/31/17 reviewed and showed an EF of 55-65%. Echo report from 05/11/17 reviewed and showed an EF of 55% along with mild AR/ TR/ Danny.  Was in the ED 12/03/21 due to chest pain which resolved with SL NTG prior to presentation. Found to have hypoglycemia which improved after he ate. He was then released. Was in the ED 11/17/21 due to concern of a food bolus after eating a hot dog. CXR was negative and he was released. Had lumbar decompression surgery done 07/15/21.  He presents today for a follow-up visit with a chief complaint of moderate fatigue with minimal exertion. He describes this as chronic in nature having been present for several years. He has associated shortness of breath, weakness and chronic low back pain along with this. He denies any difficulty sleeping, abdominal distention, palpitations, pedal edema, chest pain, cough or weight gain.   Has moved to [facility] and says that he's enjoying living there. Gets his medications pill packed from his pharmacy so is having no problems taking them. Medications get delivered from his pharmacy.   Home glucose has been running 80's-120's although this morning it was 214.  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[place])    Myocardial infarction ([place])    Paranoid disorder ([place])    Pneumonia    Schizophrenia, paranoid ([place])    Sleep apnea    Stroke ([place])    mini TIA   TIA (transient ischemic attack)    Past Surgical History:  Procedure Laterality Date   CARDIAC CATHETERIZATION     CATARACT EXTRACTION W/PHACO Right 04/25/2021   Procedure: CATARACT EXTRACTION PHACO AND INTRAOCULAR LENS PLACEMENT ([place]) RIGHT DIABETIC;  Surgeon: Birder Robson, MD;  Location: [facility] ORS;  Service: Ophthalmology;  Laterality: Right;  3.32 0:23.0   CATARACT EXTRACTION W/PHACO Left 09/19/2021   Procedure: CATARACT EXTRACTION PHACO AND INTRAOCULAR LENS PLACEMENT ([place]) LEFT DIABETIC;  Surgeon: Birder Robson, MD;  Location: [facility] ORS;  Service: Ophthalmology;  Laterality: Left;  6.73 0:49.1   COLONOSCOPY WITH PROPOFOL N/A 09/03/2015   Procedure: COLONOSCOPY WITH PROPOFOL;  Surgeon: Lollie Sails, MD;  Location: [facility] ENDOSCOPY;  Service: Endoscopy;  Laterality: N/A;   COLONOSCOPY WITH PROPOFOL N/A 09/17/2020   Procedure: COLONOSCOPY WITH PROPOFOL;  Surgeon: Lesly Rubenstein, MD;  Location: [facility] ENDOSCOPY;  Service: Endoscopy;  Laterality: N/A;  PREFERS 9AM OR LATER DUE TO TRANSPORTATION   DG TEETH FULL     ESOPHAGOGASTRODUODENOSCOPY (EGD) WITH PROPOFOL N/A 07/01/2016   Dr.  Skulskie, [facility]. abnormal esophageal motility, suspicious for presbyesophagus, bile gastritis s/p biopsy, non-bleeding erosive gastropathy s/p biopsy, normal duodenum, path with negative H.pylori and +chronic active gastritis   ESOPHAGOGASTRODUODENOSCOPY (EGD) WITH PROPOFOL N/A 09/17/2020   Procedure: ESOPHAGOGASTRODUODENOSCOPY (EGD) WITH PROPOFOL;  Surgeon: Lesly Rubenstein, MD;  Location: [facility] ENDOSCOPY;  Service: Endoscopy;  Laterality: N/A;   EXTRACORPOREAL SHOCK WAVE LITHOTRIPSY  Right 11/01/2020   Procedure: EXTRACORPOREAL SHOCK WAVE LITHOTRIPSY (ESWL);  Surgeon: Abbie Sons, MD;  Location: [facility] ORS;  Service: Urology;  Laterality: Right;   LUMBAR LAMINECTOMY/DECOMPRESSION MICRODISCECTOMY N/A 07/15/2021   Procedure: L3-5 DECOMPRESSION;  Surgeon: Meade Maw, MD;  Location: [facility] ORS;  Service: Neurosurgery;  Laterality: N/A;   Family History  Problem Relation Age of Onset   Heart attack Mother    Diabetes Mellitus II Mother    Mental illness Mother    CAD Mother    Heart attack Father    CAD Father    Hypertension Father    Social History   Tobacco Use   Smoking status: Never   Smokeless tobacco: Never  Substance Use Topics   Alcohol use: No   Allergies  Allergen Reactions   Trazodone And Nefazodone     Affects other meds that they dont work   [employer] Visual [employer] Disoproxil]     Damages kidneys   Etodolac Rash   Prior to Admission medications   Medication Sig Start Date End Date Taking? Authorizing Provider  abacavir-dolutegravir-lamiVUDine (TRIUMEQ) 600-50-300 MG tablet Take 1 tablet by mouth at bedtime. 07/21/19  Yes Tsosie Billing, MD  acetaminophen (TYLENOL) 650 MG CR tablet Take 650 mg by mouth every 8 (eight) hours as needed for pain.   Yes [provider]  albuterol (PROVENTIL HFA;VENTOLIN HFA) 108 (90 Base) MCG/ACT inhaler Inhale 2 puffs into the lungs every 6 (six) hours as needed for wheezing or shortness of breath. 04/14/18  Yes Loletha Grayer, MD  aspirin 325 MG tablet Take 325 mg by mouth daily.   Yes [provider]  benztropine (COGENTIN) 0.5 MG tablet Take 1.5 mg by mouth daily. Pt unsure of dose   Yes [provider]  bismuth subsalicylate (PEPTO BISMOL) 262 MG/15ML suspension Take 15 mLs by mouth every 6 (six) hours as needed for indigestion. 1 tbsp   Yes [provider]  brimonidine-timolol (COMBIGAN) 0.2-0.5 % ophthalmic solution Place 1 drop into both eyes every 12 (twelve) hours.    Yes [provider]  diazepam (VALIUM) 5 MG tablet Take 2.5-5 mg by mouth See admin instructions. 5 mg in the morning, 2.5 mg midday, 5 mg in the evening   Yes [provider]  dicyclomine (BENTYL) 10 MG capsule Take 10 mg by mouth 4 (four) times daily -  before meals and at bedtime.   Yes [provider]  divalproex (DEPAKOTE ER) 500 MG 24 hr tablet Take 500 mg by mouth at bedtime.    Yes [provider]  docusate sodium (COLACE) 250 MG capsule Take 250 mg by mouth 2 (two) times daily.   Yes [provider]  donepezil (ARICEPT) 10 MG tablet Take 5 mg by mouth daily. 09/13/20  Yes [provider]  FANAPT 12 MG TABS Take 12 mg by mouth 2 (two) times daily.  11/14/16  Yes [provider]  Fluocinolone Acetonide 0.01 % OIL Place 2-4 drops in ear(s) every other day. At bedtime for dry skin   Yes [provider]  fluticasone (FLOVENT HFA) 220 MCG/ACT inhaler Inhale 2  puffs into the lungs 2 (two) times daily. Rinse out mouth afterwards 04/14/18  Yes Wieting, Richard, MD  gabapentin (NEURONTIN) 600 MG tablet Take 800 mg by mouth 3 (three) times daily.   Yes [provider]  gemfibrozil (LOPID) 600 MG tablet Take 600 mg by mouth 2 (two) times daily before a meal.   Yes [provider]  glimepiride (AMARYL) 4 MG tablet Take 4 mg by mouth daily with breakfast.  06/06/19  Yes [provider]  glycopyrrolate (ROBINUL) 2 MG tablet Take 0.5 mg by mouth 2 (two) times daily. 10/15/20  Yes [provider]  guaiFENesin (MUCINEX) 600 MG 12 hr tablet Take 600 mg by mouth 2 (two) times daily.   Yes [provider]  HUMALOG KWIKPEN 100 UNIT/ML KwikPen Inject 6 Units into the skin 3 (three) times daily. Only takes Humalog if blood sugar > 200. Takes 2 units if > 200. Additional instructions per sliding scale 09/10/20  Yes [provider]  Insulin Degludec (TRESIBA FLEXTOUCH) 200 UNIT/ML SOPN Inject 10  Units into the skin daily. Patient taking differently: Inject 38 Units into the skin daily. 08/19/19  Yes Nolberto Hanlon, MD  losartan (COZAAR) 25 MG tablet Take 25 mg by mouth daily. 02/02/21  Yes [provider]  memantine (NAMENDA) 10 MG tablet Take 10 mg by mouth 2 (two) times daily.   Yes [provider]  metFORMIN (GLUCOPHAGE) 1000 MG tablet Take 1,000 mg by mouth 2 (two) times daily with a meal.   Yes [provider]  methocarbamol (ROBAXIN) 500 MG tablet Take 1 tablet (500 mg total) by mouth 4 (four) times daily. 07/15/21  Yes [name], PA  mirtazapine (REMERON) 30 MG tablet Take 30 mg by mouth at bedtime. 04/21/18  Yes [provider]  mometasone (ELOCON) 0.1 % ointment Apply 1 application topically See admin instructions. Apply 4 drops to ear canal at bedtime every other night for itching and dry skin.   Yes [provider]  nitroGLYCERIN (NITROSTAT) 0.4 MG SL tablet Place 0.4 mg under the tongue every 5 (five) minutes as needed for chest pain.    Yes [provider]  Omega-3 Fatty Acids (FISH OIL) 1000 MG CAPS Take 1,000 mg by mouth daily.   Yes [provider]  omeprazole (PRILOSEC) 40 MG capsule Take 40 mg by mouth 2 (two) times daily.    Yes [provider]  OZEMPIC, 1 MG/DOSE, 4 MG/3ML SOPN Inject 2 mg into the skin every Monday. 10/10/20  Yes [provider]  rosuvastatin (CRESTOR) 40 MG tablet Take 40 mg by mouth daily.    Yes [provider]  senna (SENOKOT) 8.6 MG TABS tablet Take 1 tablet by mouth daily.    Yes [provider]  sucralfate (CARAFATE) 1 g tablet Take 1 g by mouth 4 (four) times daily.  04/21/18  Yes [provider]  tamsulosin (FLOMAX) 0.4 MG CAPS capsule Take 0.4 mg by mouth daily. 08/02/19  Yes [provider]  venlafaxine (EFFEXOR) 37.5 MG tablet Take 37.5 mg by mouth 2 (two) times daily. Take with 75 mg in am   Yes [provider]   venlafaxine XR (EFFEXOR-XR) 75 MG 24 hr capsule Take 1 capsule (75 mg total) by mouth daily with breakfast. 11/09/17  Yes McNew, Tyson Babinski, MD  ziprasidone (GEODON) 40 MG capsule Take 40 mg by mouth daily.   Yes [provider]   Review of Systems  Constitutional:  Positive for fatigue (  easily). Negative for appetite change.  HENT:  Positive for rhinorrhea. Negative for congestion, postnasal drip and sore throat.   Eyes: Negative.   Respiratory:  Positive for shortness of breath. Negative for cough and chest tightness.   Cardiovascular:  Negative for chest pain, palpitations and leg swelling.  Gastrointestinal:  Negative for abdominal distention and abdominal pain.  Endocrine: Negative.   Genitourinary: Negative.   Musculoskeletal:  Positive for back pain (when standing for long periods of time). Negative for neck pain.  Skin: Negative.   Allergic/Immunologic: Negative.   Neurological:  Positive for weakness (leg weakness). Negative for dizziness and light-headedness.  Hematological:  Negative for adenopathy. Does not bruise/bleed easily.  Psychiatric/Behavioral:  Negative for dysphoric mood and sleep disturbance. The patient is not nervous/anxious.    Vitals:   12/09/21 0902  BP: (!) 141/81  Pulse: 60  Resp: 18  SpO2: 97%  Weight: 203 lb 6 oz (92.3 kg)  Height: '5\' 7"'$  (1.702 m)   Wt Readings from Last 3 Encounters:  12/09/21 203 lb 6 oz (92.3 kg)  12/03/21 208 lb (94.3 kg)  11/17/21 205 lb (93 kg)   Lab Results  Component Value Date   CREATININE 1.02 12/03/2021   CREATININE 1.10 07/15/2021   CREATININE 1.00 07/02/2021   Physical Exam Vitals and nursing note reviewed.  Constitutional:      Appearance: He is well-developed.  HENT:     Head: Normocephalic and atraumatic.  Neck:     Vascular: No JVD.  Cardiovascular:     Rate and Rhythm: Normal rate and regular rhythm.  Pulmonary:     Effort: Pulmonary effort is normal.     Breath sounds: No wheezing or rales.   Abdominal:     General: There is no distension.     Palpations: Abdomen is soft.     Tenderness: There is no abdominal tenderness.  Musculoskeletal:        General: No tenderness.     Cervical back: Normal range of motion and neck supple.     Right lower leg: No tenderness. No edema.     Left lower leg: No tenderness. No edema.  Skin:    General: Skin is warm and dry.  Neurological:     General: No focal deficit present.     Mental Status: He is alert and oriented to person, place, and time.  Psychiatric:        Mood and Affect: Mood normal.        Behavior: Behavior normal.        Thought Content: Thought content normal.    Assessment & Plan:  1: Chronic heart failure with preserved ejection fraction with structural changes (mild LVH)- - NYHA class III - euvolemic today - weighing daily; reminded to call for an overnight weight gain of >2 pounds or a weekly weight gain of >5 pounds - weight down 6.4 pounds from last visit here 3 months ago - not adding salt to his food and trying to watch the sodium content of his food - saw cardiology (Paraschos) 12/04/21 - wrote on patient's AVS to discuss adding jardiance and stopping glimepiride with Dr Honor Junes next week since he's having glucose levels in the 80's currently - doing exercises at home that previous PT had taught him - can take the compression sock off since no edema; showed him what to look for and if edema returns, resume wearing compression sock; if he needs new compression socks, will need to place order for his  pharmacy to come measure his legs - BNP 07/29/20 was 31.0 - PharmD reconciled medications  2: HTN- - BP mildly elevated (141/81); consider changing his losartan to entreso - BMP from 12/03/21 reviewed and showed sodium 143, potassium 4.0, creatinine 1.02 and GFR >60 - saw PCP Ola Spurr) 10/28/21  3: Diabetes- - A1c 11/14/21 was 6.2% - glucose at home this morning was 214 but generally has been running  80's-120's - saw endocrinologist Jenetta DownerDina Rich) 11/14/21; returns next week - saw nephrologist Radene Knee) 08/19/21  4: Lumbar decompression- - saw neurosurgeon Izora Ribas) 07/30/21   Patient did not bring his medications nor a list. Each medication was verbally reviewed with the patient and he was encouraged to bring the bottles to every visit to confirm accuracy of list.   Return in 6 months, sooner if needed.

## 2021-12-09 ENCOUNTER — Ambulatory Visit: Payer: Medicare Other | Attending: Family | Admitting: Family

## 2021-12-09 ENCOUNTER — Encounter: Payer: Self-pay | Admitting: Family

## 2021-12-09 VITALS — BP 141/81 | HR 60 | Resp 18 | Ht 67.0 in | Wt 203.4 lb

## 2021-12-09 DIAGNOSIS — I5032 Chronic diastolic (congestive) heart failure: Secondary | ICD-10-CM | POA: Insufficient documentation

## 2021-12-09 DIAGNOSIS — B20 Human immunodeficiency virus [HIV] disease: Secondary | ICD-10-CM | POA: Insufficient documentation

## 2021-12-09 DIAGNOSIS — I130 Hypertensive heart and chronic kidney disease with heart failure and stage 1 through stage 4 chronic kidney disease, or unspecified chronic kidney disease: Secondary | ICD-10-CM | POA: Diagnosis present

## 2021-12-09 DIAGNOSIS — Z9889 Other specified postprocedural states: Secondary | ICD-10-CM

## 2021-12-09 DIAGNOSIS — I10 Essential (primary) hypertension: Secondary | ICD-10-CM

## 2021-12-09 DIAGNOSIS — G8929 Other chronic pain: Secondary | ICD-10-CM | POA: Diagnosis not present

## 2021-12-09 DIAGNOSIS — N182 Chronic kidney disease, stage 2 (mild): Secondary | ICD-10-CM

## 2021-12-09 DIAGNOSIS — E785 Hyperlipidemia, unspecified: Secondary | ICD-10-CM | POA: Insufficient documentation

## 2021-12-09 DIAGNOSIS — M545 Low back pain, unspecified: Secondary | ICD-10-CM | POA: Insufficient documentation

## 2021-12-09 DIAGNOSIS — E1122 Type 2 diabetes mellitus with diabetic chronic kidney disease: Secondary | ICD-10-CM | POA: Insufficient documentation

## 2021-12-09 DIAGNOSIS — G4733 Obstructive sleep apnea (adult) (pediatric): Secondary | ICD-10-CM | POA: Diagnosis not present

## 2021-12-09 DIAGNOSIS — N189 Chronic kidney disease, unspecified: Secondary | ICD-10-CM | POA: Insufficient documentation

## 2021-12-09 DIAGNOSIS — Z794 Long term (current) use of insulin: Secondary | ICD-10-CM

## 2021-12-09 NOTE — Patient Instructions (Addendum)
Continue weighing daily and call for an overnight weight gain of 3 pounds or more or a weekly weight gain of more than 5 pounds.   If you have voicemail, please make sure your mailbox is cleaned out so that we may leave a message and please make sure to listen to any voicemails.    If you receive a satisfaction survey regarding the [facility], please take the time to fill it out. This way we can continue to provide excellent care and make any changes that need to be made.    Discuss with Dr Honor Junes adding Vania Rea for your heart and possibly stopping the glimiperide

## 2021-12-09 NOTE — Progress Notes (Addendum)
[place] - PHARMACIST COUNSELING NOTE  Guideline-Directed Medical Therapy/Evidence Based Medicine  ACE/ARB/ARNI: Losartan 25 mg daily Beta Blocker: None Aldosterone Antagonist: None Diuretic: None SGLT2i: None  Adherence Assessment  Do you ever forget to take your medication? '[]'$ Yes '[x]'$ No  Do you ever skip doses due to side effects? '[]'$ Yes '[x]'$ No  Do you have trouble affording your medicines? '[]'$ Yes '[x]'$ No  Are you ever unable to pick up your medication due to transportation difficulties? '[]'$ Yes '[x]'$ No  Do you ever stop taking your medications because you don't believe they are helping? '[]'$ Yes '[x]'$ No  Do you check your weight daily? '[]'$ Yes '[x]'$ No   Adherence strategy: pill packaging (pre-packed by [employer])  Barriers to obtaining medications: none  Vital signs: HR 60, BP 141/81, weight (pounds) 203 lb ECHO: Date 07/25/20, EF EF 55-60% , notes: mild left ventricular hypertrophy     Latest Ref Rng & Units 12/03/2021    3:08 PM 07/15/2021    6:26 AM 07/02/2021   10:11 AM  BMP  Glucose 70 - 99 mg/dL 70  122  180   BUN 8 - 23 mg/dL '13  10  15   '$ Creatinine 0.61 - 1.24 mg/dL 1.02  1.10  1.00   Sodium 135 - 145 mmol/L 143  141  138   Potassium 3.5 - 5.1 mmol/L 4.0  4.2  3.9   Chloride 98 - 111 mmol/L 108  103  103   CO2 22 - 32 mmol/L 25   24   Calcium 8.9 - 10.3 mg/dL 9.3   9.6     Past Medical History:  Diagnosis Date   Anemia    Anxiety    Arthritis    CHF (congestive heart failure) (HCC)    Chronic kidney disease    Renal Insufficiency Syndrome; Glomerulosclerosis 2013   Depressed    Diabetes mellitus without complication (HCC)    GERD (gastroesophageal reflux disease)    Headache    High cholesterol    History of kidney stones    HIV (human immunodeficiency virus infection) (HCC)    Hypertension    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 PMH relevant to T2DM, HIV, HLD, CKD, CAD (previous MI), HFpEF. Doing well overall. Did not bring medications with him. Doses of medications limited to patient memory. Patient is unsure of some doses but aware of medication names when associated with indication. Not on a fluid pill. Reports weights have been consistently 203-204 lb in the past week. Patient has a blood pressure cuff but has not remembered to check blood pressure recently. Last A1c 6.2% and reports blood glucoses 80-120 at home. Has an appointment with endocrinology next week.   GFR > 60 on 12/03/21. Recent ED Visit (past 6 months): Date - 12/03/21, CC - chest pain likely 2/2 hypoglycemia  PLAN Advised to bring medications at future visits.  Reaching out to endocrinologist to consider swtiching glimepride to Jardiance 10 mg daily. An SGLT2i is optimal for this patient given comorbid HFpEF with T2DM.  Would avoid glimepiride due to it's increased risk of hypoglycemia and lack of benefit in cardiovascular disease.  Time spent: 15 minutes  Noelle Penner, PharmD Pharmacy Resident  12/09/2021 9:38 AM  Current Outpatient Medications:    abacavir-dolutegravir-lamiVUDine (TRIUMEQ) 600-50-300 MG tablet, Take 1 tablet by mouth at bedtime., Disp: 30 tablet, Rfl: 6   acetaminophen (TYLENOL) 650 MG CR tablet, Take 650 mg by mouth every 8 (eight) hours as needed for pain., Disp: , Rfl:    albuterol (PROVENTIL HFA;VENTOLIN HFA) 108 (90 Base) MCG/ACT inhaler, Inhale 2 puffs into the lungs every 6 (six) hours as needed for wheezing or shortness of breath., Disp: 1 Inhaler, Rfl: 2   aspirin 325 MG tablet, Take 325 mg by mouth daily., Disp: , Rfl:    benztropine (COGENTIN) 0.5 MG tablet, Take 1.5 mg by mouth daily. Pt unsure of dose, Disp: , Rfl:    bismuth  subsalicylate (PEPTO BISMOL) 262 MG/15ML suspension, Take 15 mLs by mouth every 6 (six) hours as needed for indigestion. 1 tbsp, Disp: , Rfl:    brimonidine-timolol (COMBIGAN) 0.2-0.5 % ophthalmic solution, Place 1 drop into both eyes every 12 (twelve) hours., Disp: , Rfl:    diazepam (VALIUM) 5 MG tablet, Take 2.5-5 mg by mouth See admin instructions. 5 mg in the morning, 2.5 mg midday, 5 mg in the evening, Disp: , Rfl:    dicyclomine (BENTYL) 10 MG capsule, Take 10 mg by mouth 4 (four) times daily -  before meals and at bedtime., Disp: , Rfl:    divalproex (DEPAKOTE ER) 500 MG 24 hr tablet, Take 500 mg by mouth at bedtime. , Disp: , Rfl:    docusate sodium (COLACE) 250 MG capsule, Take 250 mg by mouth 2 (two) times daily., Disp: , Rfl:    donepezil (ARICEPT) 10 MG tablet, Take 5 mg by mouth daily., Disp: , Rfl:    FANAPT 12 MG TABS, Take 12 mg by mouth 2 (two) times daily. , Disp: , Rfl:    Fluocinolone Acetonide 0.01 % OIL, Place 2-4 drops in ear(s) every other day. At bedtime for dry skin, Disp: , Rfl:    fluticasone (FLOVENT HFA) 220 MCG/ACT inhaler, Inhale 2 puffs into the lungs 2 (two) times daily. Rinse out mouth afterwards, Disp: 1 Inhaler, Rfl: 12   gabapentin (NEURONTIN) 600 MG tablet, Take 800 mg by mouth 3 (three) times daily., Disp: , Rfl:    gemfibrozil (LOPID) 600 MG tablet, Take 600 mg by mouth 2 (two) times daily before a meal., Disp: , Rfl:    glimepiride (AMARYL) 4 MG tablet, Take 4 mg by mouth daily with breakfast. , Disp: , Rfl:    glycopyrrolate (ROBINUL) 2 MG tablet, Take 0.5 mg by mouth 2 (two) times daily., Disp: , Rfl:    guaiFENesin (MUCINEX) 600 MG 12 hr tablet, Take 600 mg by mouth 2 (two) times daily., Disp: , Rfl:    HUMALOG KWIKPEN 100 UNIT/ML KwikPen, Inject 6 Units into the skin 3 (three) times daily. Only takes Humalog if blood sugar > 200. Takes 2 units if > 200. Additional instructions per sliding scale, Disp: , Rfl:    Insulin Degludec (TRESIBA FLEXTOUCH) 200  UNIT/ML SOPN, Inject 10 Units into the skin daily. (Patient taking differently: Inject 38 Units into the skin daily.), Disp: , Rfl:    losartan (COZAAR) 25 MG tablet, Take 25 mg by mouth daily., Disp: , Rfl:    memantine (NAMENDA) 10 MG tablet, Take 10 mg by mouth 2 (two) times daily., Disp: , Rfl:    metFORMIN (GLUCOPHAGE) 1000 MG tablet, Take 1,000 mg by  mouth 2 (two) times daily with a meal., Disp: , Rfl:    methocarbamol (ROBAXIN) 500 MG tablet, Take 1 tablet (500 mg total) by mouth 4 (four) times daily., Disp: 120 tablet, Rfl: 0   mirtazapine (REMERON) 30 MG tablet, Take 30 mg by mouth at bedtime., Disp: , Rfl:    mometasone (ELOCON) 0.1 % ointment, Apply 1 application topically See admin instructions. Apply 4 drops to ear canal at bedtime every other night for itching and dry skin., Disp: , Rfl:    nitroGLYCERIN (NITROSTAT) 0.4 MG SL tablet, Place 0.4 mg under the tongue every 5 (five) minutes as needed for chest pain. , Disp: , Rfl:    Omega-3 Fatty Acids (FISH OIL) 1000 MG CAPS, Take 1,000 mg by mouth daily., Disp: , Rfl:    omeprazole (PRILOSEC) 40 MG capsule, Take 40 mg by mouth 2 (two) times daily. , Disp: , Rfl:    OZEMPIC, 1 MG/DOSE, 4 MG/3ML SOPN, Inject 2 mg into the skin every Monday., Disp: , Rfl:    rosuvastatin (CRESTOR) 40 MG tablet, Take 40 mg by mouth daily. , Disp: , Rfl:    senna (SENOKOT) 8.6 MG TABS tablet, Take 1 tablet by mouth daily. , Disp: , Rfl:    sucralfate (CARAFATE) 1 g tablet, Take 1 g by mouth 4 (four) times daily. , Disp: , Rfl:    tamsulosin (FLOMAX) 0.4 MG CAPS capsule, Take 0.4 mg by mouth daily., Disp: , Rfl:    venlafaxine (EFFEXOR) 37.5 MG tablet, Take 37.5 mg by mouth 2 (two) times daily. Take with 75 mg in am, Disp: , Rfl:    venlafaxine XR (EFFEXOR-XR) 75 MG 24 hr capsule, Take 1 capsule (75 mg total) by mouth daily with breakfast., Disp: 30 capsule, Rfl: 0   ziprasidone (GEODON) 40 MG capsule, Take 40 mg by mouth daily., Disp: , Rfl:    COUNSELING  POINTS/CLINICAL PEARLS   DRUGS TO CAUTION IN HEART FAILURE  Drug or Class Mechanism  Analgesics NSAIDs COX-2 inhibitors Glucocorticoids  Sodium and water retention, increased systemic vascular resistance, decreased response to diuretics   Diabetes Medications Metformin Thiazolidinediones Rosiglitazone (Avandia) Pioglitazone (Actos) DPP4 Inhibitors Saxagliptin (Onglyza) Sitagliptin (Januvia)   Lactic acidosis Possible calcium channel blockade   Unknown  Antiarrhythmics Class I  Flecainide Disopyramide Class III Sotalol Other Dronedarone  Negative inotrope, proarrhythmic   Proarrhythmic, beta blockade  Negative inotrope  Antihypertensives Alpha Blockers Doxazosin Calcium Channel Blockers Diltiazem Verapamil Nifedipine Central Alpha Adrenergics Moxonidine Peripheral Vasodilators Minoxidil  Increases renin and aldosterone  Negative inotrope    Possible sympathetic withdrawal  Unknown  Anti-infective Itraconazole Amphotericin B  Negative inotrope Unknown  Hematologic Anagrelide Cilostazol   Possible inhibition of PD IV Inhibition of PD III causing arrhythmias  Neurologic/Psychiatric Stimulants Anti-Seizure Drugs Carbamazepine Pregabalin Antidepressants Tricyclics Citalopram Parkinsons Bromocriptine Pergolide Pramipexole Antipsychotics Clozapine Antimigraine Ergotamine Methysergide Appetite suppressants Bipolar Lithium  Peripheral alpha and beta agonist activity  Negative inotrope and chronotrope Calcium channel blockade  Negative inotrope, proarrhythmic Dose-dependent QT prolongation  Excessive serotonin activity/valvular damage Excessive serotonin activity/valvular damage Unknown  IgE mediated hypersensitivy, calcium channel blockade  Excessive serotonin activity/valvular damage Excessive serotonin activity/valvular damage Valvular damage  Direct myofibrillar degeneration, adrenergic stimulation   Antimalarials Chloroquine Hydroxychloroquine Intracellular inhibition of lysosomal enzymes  Urologic Agents Alpha Blockers Doxazosin Prazosin Tamsulosin Terazosin  Increased renin and aldosterone  Adapted from Page Carleene Overlie, et al. "Drugs That May Cause or Exacerbate Heart Failure: A Scientific Statement from the American Heart  Association." Circulation 2016;  053:Z76-B34. DOI: 10.1161/CIR.0000000000000426   MEDICATION ADHERENCES TIPS AND STRATEGIES Taking medication as prescribed improves patient outcomes in heart failure (reduces hospitalizations, improves symptoms, increases survival) Side effects of medications can be managed by decreasing doses, switching agents, stopping drugs, or adding additional therapy. Please let someone in the [facility] know if you have having bothersome side effects so we can modify your regimen. Do not alter your medication regimen without talking to [place].  Medication reminders can help patients remember to take drugs on time. If you are missing or forgetting doses you can try linking behaviors, using pill boxes, or an electronic reminder like an alarm on your phone or an app. Some people can also get automated phone calls as medication reminders.

## 2021-12-10 ENCOUNTER — Ambulatory Visit: Payer: Medicare Other

## 2021-12-18 ENCOUNTER — Inpatient Hospital Stay: Admission: RE | Admit: 2021-12-18 | Payer: Medicare Other | Source: Ambulatory Visit

## 2021-12-30 ENCOUNTER — Ambulatory Visit: Admission: RE | Admit: 2021-12-30 | Payer: Medicare Other | Source: Ambulatory Visit

## 2022-01-14 ENCOUNTER — Ambulatory Visit: Admission: RE | Admit: 2022-01-14 | Payer: Medicare Other | Source: Ambulatory Visit

## 2022-01-30 ENCOUNTER — Ambulatory Visit
Admission: RE | Admit: 2022-01-30 | Discharge: 2022-01-30 | Disposition: A | Discharge status: Home / Self Care | Payer: Medicare Other | Source: Ambulatory Visit | Attending: Infectious Diseases | Admitting: Infectious Diseases

## 2022-01-30 DIAGNOSIS — R1310 Dysphagia, unspecified: Secondary | ICD-10-CM | POA: Diagnosis present

## 2022-01-30 NOTE — Therapy (Signed)
[facility] [place], [place], [address] Phone: [phone]   Fax:     Modified Barium Swallow  Patient Details  Name: Danny Cook. MRN: [id number] Date of Birth: [birth year] No data recorded  Encounter Date: 01/30/2022   End of Session - 01/30/22 1733     Visit Number 1    Number of Visits 1    Date for SLP Re-Evaluation 01/30/22    SLP Start Time 1245    SLP Stop Time  1330    SLP Time Calculation (min) 45 min    Activity Tolerance Patient tolerated treatment well             Past Medical History:  Diagnosis Date   Anemia    Anxiety    Arthritis    CHF (congestive heart failure) ([place])    Chronic kidney disease    Renal Insufficiency Syndrome; Glomerulosclerosis 2013   Depressed    Diabetes mellitus without complication (HCC)    GERD (gastroesophageal reflux disease)    Headache    High cholesterol    History of kidney stones    HIV (human immunodeficiency virus infection) ([place])    Hypertension    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)    Myocardial infarction ([place])    Paranoid disorder ([place])    Pneumonia    Schizophrenia, paranoid ([place])    Sleep apnea    Stroke ([place])    mini TIA   TIA (transient ischemic attack)     Past Surgical History:  Procedure Laterality Date   CARDIAC CATHETERIZATION     CATARACT EXTRACTION W/PHACO Right 04/25/2021   Procedure: CATARACT EXTRACTION PHACO AND INTRAOCULAR LENS PLACEMENT ([place]) RIGHT DIABETIC;  Surgeon: Birder Robson, MD;  Location: [facility] ORS;  Service: Ophthalmology;  Laterality: Right;  3.32 0:23.0   CATARACT EXTRACTION W/PHACO Left 09/19/2021   Procedure: CATARACT EXTRACTION PHACO AND INTRAOCULAR LENS PLACEMENT ([place]) LEFT DIABETIC;  Surgeon: Birder Robson, MD;  Location: [facility] OR[facility];  Service: Ophthalmology;  Laterality: Left;  6.73 0:49.1   COLONOSCOPY WITH PROPOFOL N/A 09/03/2015   Procedure: COLONOSCOPY WITH PROPOFOL;  Surgeon: Lollie Sails, MD;  Location: [facility] ENDOSCOPY;  Service: Endoscopy;  Laterality: N/A;   COLONOSCOPY WITH PROPOFOL N/A 09/17/2020   Procedure: COLONOSCOPY WITH PROPOFOL;  Surgeon: Lesly Rubenstein, MD;  Location: [facility] ENDOSCOPY;  Service: Endoscopy;  Laterality: N/A;  PREFERS 9AM OR LATER DUE TO TRANSPORTATION   DG TEETH FULL     ESOPHAGOGASTRODUODENOSCOPY (EGD) WITH PROPOFOL N/A 07/01/2016   Dr. [name], [facility]. abnormal esophageal motility, suspicious for presbyesophagus, bile gastritis s/p biopsy, non-bleeding erosive gastropathy s/p biopsy, normal duodenum, path with negative H.pylori and +chronic active gastritis   ESOPHAGOGASTRODUODENOSCOPY (EGD) WITH PROPOFOL N/A 09/17/2020   Procedure: ESOPHAGOGASTRODUODENOSCOPY (EGD) WITH PROPOFOL;  Surgeon: Lesly Rubenstein, MD;  Location: [facility] ENDOSCOPY;  Service: Endoscopy;  Laterality: N/A;   EXTRACORPOREAL SHOCK WAVE LITHOTRIPSY Right 11/01/2020   Procedure: EXTRACORPOREAL SHOCK WAVE LITHOTRIPSY (ESWL);  Surgeon: Abbie Sons, MD;  Location: [facility] ORS;  Service: Urology;  Laterality: Right;   LUMBAR LAMINECTOMY/DECOMPRESSION MICRODISCECTOMY N/A 07/15/2021   Procedure: L3-5 DECOMPRESSION;  Surgeon: Meade Maw, MD;  Location: [facility] ORS;  Service: Neurosurgery;  Laterality: N/A;    There were no vitals filed for this visit.   Dysphagia, unspecified type - Plan: DG SWALLOW FUNC OP MEDICARE SPEECH PATH, DG SWALLOW FUNC OP MEDICARE SPEECH PATH     01/30/22 1600  SLP Visit Information  SLP Received  On 01/30/22  Subjective  Subjective Pt reports difficulty with meat, solids  Patient/Family Stated Goal see if my swallowing is all right  Pain Assessment  Pain Assessment No/denies pain  General Information  Date of Onset 11/20/21 (referral date)  HPI Danny Cook is a 61 y.o. male with past medical history including HIV, schizoaffective disorder, HTN, HLD, CKD, heart failure, diabetes, OSA, and anxiety. Recent ED visits 12/03/21 for chest  pain and 11/17/21 for globus sensation after eating a hot dog. Pt had MBS in 2018 with findings of mild oropharyngeal dysphagia. Regular diet and thin liquids recommended, with consideration of softening/moistening foods for comfort as needed; GI consult was recommended. EGD 09/17/2020: "Normal esophagus. Erythematous mucosa in the antrum. Biopsied. Normal examined duodenum."  Type of Study MBS-Modified Barium Swallow Study  Previous Swallow Assessment see HPI  Diet Prior to this Study Regular;Thin liquids  Temperature Spikes Noted No  Respiratory Status Room air  History of Recent Intubation No  Behavior/Cognition Alert;Cooperative  Oral Cavity Assessment WFL  Oral Care Completed by SLP No  Oral Cavity - Dentition Edentulous  Vision Functional for self feeding  Self-Feeding Abilities Able to feed self  Patient Positioning Upright in chair;Partially reclined;Postural control interferes with function  Baseline Vocal Quality Low vocal intensity  Volitional Cough Weak  Volitional Swallow Able to elicit  Anatomy Other (Comment) (significant kyphosis)  Pharyngeal Secretions Not observed secondary MBS  Oral Motor/Sensory Function  Overall Oral Motor/Sensory Function Mild impairment  Facial ROM WFL  Facial Symmetry WFL  Facial Strength WFL  Facial Sensation WFL  Mandible Impaired (reduced/weak jaw opening)  Oral Preparation/Oral Phase  Oral Phase Impaired  Oral - Honey  Oral - Honey Teaspoon Reduced posterior propulsion;Lingual/palatal residue;Decreased bolus cohesion;Delayed oral transit  Oral - Nectar  Oral - Nectar Teaspoon Reduced posterior propulsion;Lingual/palatal residue;Decreased bolus cohesion;Delayed oral transit  Oral - Nectar Cup Reduced posterior propulsion;Lingual/palatal residue;Decreased bolus cohesion;Delayed oral transit  Oral - Thin  Oral - Thin Teaspoon Reduced posterior propulsion;Lingual/palatal residue;Decreased bolus cohesion;Delayed oral transit;Premature spillage   Oral - Thin Cup Reduced posterior propulsion;Lingual/palatal residue;Delayed oral transit;Decreased bolus cohesion;Premature spillage  Oral - Solids  Oral - Puree Reduced posterior propulsion;Lingual/palatal residue;Decreased bolus cohesion;Delayed oral transit  Oral - Regular Reduced posterior propulsion;Lingual/palatal residue;Decreased bolus cohesion;Delayed oral transit;Impaired mastication  Pharyngeal Phase  Pharyngeal Phase Impaired  Pharyngeal - Honey  Pharyngeal- Honey Teaspoon Delayed swallow initiation-vallecula  Pharyngeal Material does not enter airway  Pharyngeal - Nectar  Pharyngeal- Nectar Teaspoon Delayed swallow initiation-vallecula  Pharyngeal Material does not enter airway  Pharyngeal- Nectar Cup Delayed swallow initiation-vallecula  Pharyngeal Material does not enter airway  Pharyngeal - Thin  Pharyngeal- Thin Teaspoon Delayed swallow initiation-pyriform sinuses;Penetration/Aspiration before swallow  Pharyngeal Material enters airway, CONTACTS cords and not ejected out  Pharyngeal- Thin Cup Delayed swallow initiation-pyriform sinuses;Penetration/Aspiration before swallow;Trace aspiration;Moderate aspiration  Pharyngeal Material enters airway, passes BELOW cords without attempt by patient to eject out (silent aspiration)  Pharyngeal - Solids  Pharyngeal- Puree Delayed swallow initiation-vallecula  Pharyngeal Material does not enter airway  Pharyngeal- Regular Delayed swallow initiation-vallecula  Pharyngeal Material does not enter airway  Cervical Esophageal Phase  Cervical Esophageal Phase Impaired  Cervical Esophageal Phase - Thin  Thin Teaspoon Reduced cricopharyngeal relaxation;Prominent cricopharyngeal segment;Esophageal backflow into cervical esophagus;Esophageal backflow into the pharynx;Other (Comment)  Cervical Esophageal Phase - Comment  Cervical Esophageal Comment intermittent trace backflow/residue with all consistencies  Clinical Impression  Clinical  Impression Patient presents with moderate oropharyngeal and pharyngoesophageal dysphagia. Oral phase is characterized by  reduced bolus containment, and slow/disorganized lingual transport. Pt is edentulous and uses tongue to mash bolus against pallate in a slow and effortful manner. There is premature spillage to the pyriform sinuses with thin liquids, which results in deep laryngeal penetration before the swallow. Pt has significant kyphosis which positions pyriforms directly above the open airway. With larger sips, there is moderate volume silent aspiration. Cued cough is weak and not effective for airway clearance. Laryngeal penetration from smaller boluses did not clear, and pt aspirated additional trace amounts of this penetration during subsequent swallows (also silent). Repositioning (slightly reclined for more vertical pharynx alignment) did not prevent aspiration. Most effective strategy was small (~5cc)sips with 3 second bolus hold followed by effortful swallow (no penetration/aspiration in 3/4 trials, however 4th attempt pt did not perform correctly). There was no penetration or aspiration of any other consistency, and no abnormal residue remaining in the pharynx post-swallow. The pharyngoesophageal segment appears prominent, with reduced amplitude of opening (osteophyte impingement noted on previous MBS), and slow clearance through the cervical esophagus with intermittent trace backflow through the PE segment. Patient was educated using video for teachback after the study, and written recommendations were provided. Patient may benefit from temporary downgrade to nectar thick liquids, until further education and carryover of strategy can be reinforced. Recommend soft diet with meats chopped/moistened for easier mastication and to aid better esophageal clearance. Pills should be whole or crushed in puree, as tolerated. Recommend [place] SLP with focus on training the above precautions, effortful swallow, and  cough strength. If pt able to perform small sip, bolus hold and effortful swallow consistently, recommend advancing to thin liquids, as long-term use of thickened liquids is not recommended for this patient with history of esophageal dysphagia.  SLP Visit Diagnosis Dysphagia, oropharyngeal phase (R13.12);Dysphagia, pharyngeal phase (R13.13)  Impact on safety and function Moderate aspiration risk  Swallow Evaluation Recommendations  SLP Diet Recommendations Dysphagia 3 (Mech soft) solids;Nectar thick liquid (chopped, moistened meats, free water protocol)  Liquid Administration via Cup  Medication Administration Whole meds with puree  Compensations Slow rate;Small sips/bites;Effortful swallow;Other (Comment) (3 second bolus hold for thin liquid, followed by effortful swallow)  Postural Changes Seated upright at 90 degrees;Remain semi-upright after after feeds/meals (Comment)  Treatment Plan  Oral Care Recommendations Oral care QID  Treatment Recommendations Other (Comment);Defer treatment plan to f/u with SLP (Home health SLP)  Follow Up Recommendations Home health SLP  Functional Status Assessment Patient has had a recent decline in their functional status and/or demonstrates limited ability to make significant improvements in function in a reasonable and predictable amount of time  Prognosis  Prognosis for Safe Diet Advancement Fair (-good)  Barriers to Reach Goals Cognitive deficits  Individuals Consulted  Consulted and Agree with Results and Recommendations Patient  Report Sent to  Referring physician  Progression Toward Goals  Progression toward goals Goals met, education completed, patient discharged from SLP  SLP Time Calculation  SLP Start Time (ACUTE ONLY) 1245  SLP Stop Time (ACUTE ONLY) 1330  SLP Time Calculation (min) (ACUTE ONLY) 45 min  SLP Evaluations  $ SLP Speech Visit 1 Visit  SLP Evaluations  $Outpatient MBS Swallow 1 Procedure       Problem List Patient  Active Problem List   Diagnosis Date Noted   Abdominal pain 07/30/2020   Choledocholithiasis 07/30/2020   Hypoglycemia 07/30/2020   Dehydration 07/30/2020   Hyperlipidemia 07/30/2020   Acute renal failure superimposed on stage 3a chronic kidney disease ([place]) 07/30/2020   Acute cholecystitis  07/29/2020   Sepsis ([place]) 08/15/2019   NSTEMI (non-ST elevated myocardial infarction) ([place]) 06/07/2018   Lymphedema 05/12/2018   Pleural effusion    FUO (fever of unknown origin)    MGUS (monoclonal gammopathy of unknown significance)    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   Healthcare associated bacterial pneumonia 04/25/2018   Community acquired pneumonia 04/12/2018   Pneumonia 04/12/2018   AKI (acute kidney injury) ([place]) 03/23/2018   Pericarditis with effusion 03/23/2018   Schizoaffective disorder, depressive type ([place]) 11/04/2017   HIV positive ([place]) 11/04/2017   Orthostatic dizziness 07/31/2017   Chronic diastolic heart failure ([place]) 07/29/2017   HTN (hypertension) 07/29/2017   Diabetes ([place]) 07/29/2017   Schizophrenia ([place]) 07/29/2017   Deneise Lever, MS, CCC-SLP Speech-Language Pathologist [phone]  Aliene Altes, [place] 01/30/2022, 5:33 PM  [facility] [place], [place], [address] Phone: [phone]   Fax:     Name: Danny Cook. MRN: [id number] Date of Birth: [birth year]

## 2022-03-31 ENCOUNTER — Emergency Department
Admission: EM | Admit: 2022-03-31 | Discharge: 2022-03-31 | Disposition: A | Discharge status: Home / Self Care | Payer: Medicare Other | Attending: Emergency Medicine | Admitting: Emergency Medicine

## 2022-03-31 ENCOUNTER — Emergency Department: Payer: Medicare Other

## 2022-03-31 DIAGNOSIS — R0602 Shortness of breath: Secondary | ICD-10-CM | POA: Diagnosis not present

## 2022-03-31 DIAGNOSIS — I2510 Atherosclerotic heart disease of native coronary artery without angina pectoris: Secondary | ICD-10-CM | POA: Diagnosis not present

## 2022-03-31 DIAGNOSIS — I509 Heart failure, unspecified: Secondary | ICD-10-CM | POA: Diagnosis not present

## 2022-03-31 DIAGNOSIS — Z20822 Contact with and (suspected) exposure to covid-19: Secondary | ICD-10-CM | POA: Diagnosis not present

## 2022-03-31 DIAGNOSIS — I110 Hypertensive heart disease with heart failure: Secondary | ICD-10-CM | POA: Insufficient documentation

## 2022-03-31 DIAGNOSIS — R509 Fever, unspecified: Secondary | ICD-10-CM | POA: Diagnosis present

## 2022-03-31 DIAGNOSIS — R110 Nausea: Secondary | ICD-10-CM | POA: Insufficient documentation

## 2022-03-31 DIAGNOSIS — R42 Dizziness and giddiness: Secondary | ICD-10-CM | POA: Diagnosis not present

## 2022-03-31 DIAGNOSIS — R531 Weakness: Secondary | ICD-10-CM | POA: Diagnosis not present

## 2022-03-31 DIAGNOSIS — Z21 Asymptomatic human immunodeficiency virus [HIV] infection status: Secondary | ICD-10-CM | POA: Insufficient documentation

## 2022-03-31 DIAGNOSIS — J029 Acute pharyngitis, unspecified: Secondary | ICD-10-CM | POA: Diagnosis not present

## 2022-03-31 DIAGNOSIS — E119 Type 2 diabetes mellitus without complications: Secondary | ICD-10-CM | POA: Insufficient documentation

## 2022-03-31 LAB — URINALYSIS, ROUTINE W REFLEX MICROSCOPIC
Bilirubin Urine: NEGATIVE
Glucose, UA: NEGATIVE mg/dL
Hgb urine dipstick: NEGATIVE
Ketones, ur: NEGATIVE mg/dL
Leukocytes,Ua: NEGATIVE
Nitrite: NEGATIVE
Protein, ur: 100 mg/dL — AB
Specific Gravity, Urine: 1.014 (ref 1.005–1.030)
pH: 5 (ref 5.0–8.0)

## 2022-03-31 LAB — CBC WITH DIFFERENTIAL/PLATELET
Abs Immature Granulocytes: 0.04 10*3/uL (ref 0.00–0.07)
Basophils Absolute: 0.1 10*3/uL (ref 0.0–0.1)
Basophils Relative: 1 %
Eosinophils Absolute: 0 10*3/uL (ref 0.0–0.5)
Eosinophils Relative: 0 %
HCT: 37.5 % — ABNORMAL LOW (ref 39.0–52.0)
Hemoglobin: 12.7 g/dL — ABNORMAL LOW (ref 13.0–17.0)
Immature Granulocytes: 0 %
Lymphocytes Relative: 15 %
Lymphs Abs: 1.7 10*3/uL (ref 0.7–4.0)
MCH: 32.2 pg (ref 26.0–34.0)
MCHC: 33.9 g/dL (ref 30.0–36.0)
MCV: 94.9 fL (ref 80.0–100.0)
Monocytes Absolute: 1.1 10*3/uL — ABNORMAL HIGH (ref 0.1–1.0)
Monocytes Relative: 10 %
Neutro Abs: 8.4 10*3/uL — ABNORMAL HIGH (ref 1.7–7.7)
Neutrophils Relative %: 74 %
Platelets: 180 10*3/uL (ref 150–400)
RBC: 3.95 MIL/uL — ABNORMAL LOW (ref 4.22–5.81)
RDW: 12.8 % (ref 11.5–15.5)
WBC: 11.3 10*3/uL — ABNORMAL HIGH (ref 4.0–10.5)
nRBC: 0 % (ref 0.0–0.2)

## 2022-03-31 LAB — COMPREHENSIVE METABOLIC PANEL
ALT: 11 U/L (ref 0–44)
AST: 27 U/L (ref 15–41)
Albumin: 3.7 g/dL (ref 3.5–5.0)
Alkaline Phosphatase: 94 U/L (ref 38–126)
Anion gap: 11 (ref 5–15)
BUN: 18 mg/dL (ref 8–23)
CO2: 22 mmol/L (ref 22–32)
Calcium: 9.2 mg/dL (ref 8.9–10.3)
Chloride: 105 mmol/L (ref 98–111)
Creatinine, Ser: 1.38 mg/dL — ABNORMAL HIGH (ref 0.61–1.24)
GFR, Estimated: 57 mL/min — ABNORMAL LOW (ref >60)
Glucose, Bld: 141 mg/dL — ABNORMAL HIGH (ref 70–99)
Potassium: 4.6 mmol/L (ref 3.5–5.1)
Sodium: 138 mmol/L (ref 135–145)
Total Bilirubin: 1.1 mg/dL (ref 0.3–1.2)
Total Protein: 7.5 g/dL (ref 6.5–8.1)

## 2022-03-31 LAB — GROUP A STREP BY PCR: Group A Strep by PCR: NOT DETECTED

## 2022-03-31 LAB — RESP PANEL BY RT-PCR (FLU A&B, COVID) ARPGX2
Influenza A by PCR: NEGATIVE
Influenza B by PCR: NEGATIVE
SARS Coronavirus 2 by RT PCR: NEGATIVE

## 2022-03-31 LAB — LACTIC ACID, PLASMA
Lactic Acid, Venous: 1.5 mmol/L (ref 0.5–1.9)
Lactic Acid, Venous: 1.2 mmol/L (ref 0.5–1.9)

## 2022-03-31 LAB — TROPONIN I (HIGH SENSITIVITY)
Troponin I (High Sensitivity): 8 ng/L (ref <18)
Troponin I (High Sensitivity): 7 ng/L (ref <18)

## 2022-03-31 LAB — BRAIN NATRIURETIC PEPTIDE: B Natriuretic Peptide: 48.5 pg/mL (ref 0.0–100.0)

## 2022-03-31 MED ORDER — SODIUM CHLORIDE 0.9 % IV BOLUS
500.0000 mL | Freq: Once | INTRAVENOUS | Status: AC
  Administered 2022-03-31: 500 mL via INTRAVENOUS

## 2022-03-31 MED ORDER — ACETAMINOPHEN 325 MG PO TABS
650.0000 mg | ORAL_TABLET | Freq: Once | ORAL | Status: AC
  Administered 2022-03-31: 650 mg via ORAL
  Filled 2022-03-31: qty 2

## 2022-03-31 MED ORDER — ONDANSETRON HCL 4 MG/2ML IJ SOLN
4.0000 mg | Freq: Once | INTRAMUSCULAR | Status: AC
  Administered 2022-03-31: 4 mg via INTRAVENOUS
  Filled 2022-03-31: qty 2

## 2022-03-31 MED ORDER — CEPHALEXIN 500 MG PO CAPS: 500.0000 mg | ORAL_CAPSULE | Freq: Two times a day (BID) | ORAL | 0 refills | Status: DC

## 2022-03-31 NOTE — ED Provider Notes (Signed)
-----------------------------------------   8:00 AM on 03/31/2022 ----------------------------------------- Patient care assumed from Dr. Cherylann Banas.  I have seen and evaluated the patient as well.  He does have moderate pharyngeal erythema with mild exudates.  His labs are reassuring including a negative strep, negative flu/COVID, reassuring chemistry, negative troponin lactate of 1.5 and overall reassuring CBC slight leukocytosis at 11,300.  Patient states he is feeling somewhat better, temperatures come down to 98.5.  Given the patient's pharyngeal erythema with exudates we will cover with antibiotics as a precaution even though the strep test is negative.  Discussed with the patient supportive care at home including fluids rest Tylenol or ibuprofen as needed for fever/discomfort, I also discussed return precautions and PCP follow-up.  Patient agreeable with plan of care.   Harvest Dark, MD 03/31/22 [phone]

## 2022-03-31 NOTE — Discharge Instructions (Signed)
Please take your antibiotic as prescribed for the next 7 days.  Please drink plenty of fluids, use Tylenol or ibuprofen as written on the box as needed for fever/discomfort.  Follow-up with your doctor in 1 to 2 days for recheck/reevaluation.  Return to the emergency department if symptoms worsen.

## 2022-03-31 NOTE — ED Provider Notes (Signed)
Danny Cook    Event Date/Time   First MD Initiated Contact with Patient 03/31/22 [phone]     (approximate)   History   Sore Throat   HPI  Danny Buntyn. is a 61 y.o. male with a history of CHF, CAD, HIV, hypertension, diabetes, and GERD who presents with shortness of breath over the last 2-3 days associated with nausea (but no vomiting) as well as with generalized weakness and sore throat.  The patient denies any chest or abdominal pain.  He states he also feels somewhat dizzy.    Physical Exam   Triage Vital Signs: ED Triage Vitals  Enc Vitals Group     BP 03/31/22 0551 121/74     Pulse Rate 03/31/22 0551 86     Resp 03/31/22 0551 16     Temp 03/31/22 0551 (!) 101.1 F (38.4 C)     Temp Source 03/31/22 0551 Oral     SpO2 03/31/22 0551 92 %     Weight 03/31/22 0552 195 lb (88.5 kg)     Height 03/31/22 0552 '5\' 7"'$  (1.702 m)     Head Circumference --      Peak Flow --      Pain Score 03/31/22 0552 9     Pain Loc --      Pain Edu? --      Excl. in [place]? --     Most recent vital signs: Vitals:   03/31/22 0615 03/31/22 0645  BP:  110/62  Pulse: 81 75  Resp: (!) 27 (!) 22  Temp:    SpO2: 92% 92%     General: Alert and oriented, weak appearing. CV:  Good peripheral perfusion.  Resp:  Normal effort.  Diminished breath sounds bilaterally. Abd:  Soft and nontender.  No distention.  Other:  No peripheral edema.   ED Results / Procedures / Treatments   Labs (all labs ordered are listed, but only abnormal results are displayed) Labs Reviewed  COMPREHENSIVE METABOLIC PANEL - Abnormal; Notable for the following components:      Result Value   Glucose, Bld 141 (*)    Creatinine, Ser 1.38 (*)    GFR, Estimated 57 (*)    All other components within normal limits  CBC WITH DIFFERENTIAL/PLATELET - Abnormal; Notable for the following components:   WBC 11.3 (*)    RBC 3.95 (*)    Hemoglobin 12.7 (*)    HCT 37.5 (*)     Neutro Abs 8.4 (*)    Monocytes Absolute 1.1 (*)    All other components within normal limits  RESP PANEL BY RT-PCR (FLU A&B, COVID) ARPGX2  GROUP A STREP BY PCR  CULTURE, BLOOD (ROUTINE X 2)  CULTURE, BLOOD (ROUTINE X 2)  BRAIN NATRIURETIC PEPTIDE  LACTIC ACID, PLASMA  URINALYSIS, ROUTINE W REFLEX MICROSCOPIC  LACTIC ACID, PLASMA  TROPONIN I (HIGH SENSITIVITY)  TROPONIN I (HIGH SENSITIVITY)     EKG  ED ECG REPORT I, Arta Silence, the attending physician, personally viewed and interpreted this ECG.  Date: 03/31/2022 EKG Time: 0608 Rate: 82 Rhythm: normal sinus rhythm QRS Axis: normal Intervals: Prolonged PR, borderline prolonged QTc ST/T Wave abnormalities: Nonspecific T wave abnormalities Narrative Interpretation: no evidence of acute ischemia    RADIOLOGY  Chest x-ray: I independently viewed and interpreted the images; there is no focal consolidation or edema  PROCEDURES:  Critical Care performed: No  Procedures   MEDICATIONS ORDERED IN ED: Medications  ondansetron (ZOFRAN)  injection 4 mg (4 mg Intravenous Given 03/31/22 0612)  sodium chloride 0.9 % bolus 500 mL (0 mLs Intravenous Stopped 03/31/22 0647)  acetaminophen (TYLENOL) tablet 650 mg (650 mg Oral Given 03/31/22 0647)     IMPRESSION / MDM / ASSESSMENT AND PLAN / ED COURSE  I reviewed the triage vital signs and the nursing notes.  61 year old male with PMH as noted above presents with generalized weakness, nausea, shortness of breath, and sore throat.  I reviewed the past medical records.  The patient was most recently admitted in January for an L3-5 lumbar decompression.  He was last seen in the ED on 6/6 for dizziness and chest pain and had a negative work-up at that time.  On exam currently the patient is alert and oriented.  He is febrile with otherwise normal vital signs.  Exam is otherwise unremarkable for acute findings.  Differential diagnosis includes, but is not limited to, acute  infection/sepsis, possible COVID-19, other viral etiology, UTI, pneumonia, hypoglycemia or other metabolic cause, less likely primary cardiac cause.  We will obtain lab work-up, chest x-ray, give fluids and reassess.  Patient's presentation is most consistent with acute presentation with potential threat to life or bodily function.  The patient is on the cardiac monitor to evaluate for evidence of arrhythmia and/or significant heart rate changes.  ----------------------------------------- 7:15 AM on 03/31/2022 -----------------------------------------  Chest x-ray shows no acute findings.  Lactate is normal.  There is mild leukocytosis.  The remainder of the work-up is pending.  Have signed the patient out to the oncoming ED physician Dr. Kerman Passey.  FINAL CLINICAL IMPRESSION(S) / ED DIAGNOSES   Final diagnoses:  Febrile illness     Rx / DC Orders   ED Discharge Orders     None        Cook:  This document was prepared using Dragon voice recognition software and may include unintentional dictation errors.    Arta Silence, MD 03/31/22 [phone]

## 2022-03-31 NOTE — ED Triage Notes (Signed)
Pt reports /N, sore throat, dizziness, generalized weakness, x3 days.

## 2022-04-01 ENCOUNTER — Other Ambulatory Visit: Payer: Self-pay

## 2022-04-01 ENCOUNTER — Emergency Department
Admission: EM | Admit: 2022-04-01 | Discharge: 2022-04-01 | Disposition: A | Discharge status: Home / Self Care | Payer: Medicare Other | Attending: Emergency Medicine | Admitting: Emergency Medicine

## 2022-04-01 DIAGNOSIS — R7881 Bacteremia: Secondary | ICD-10-CM | POA: Diagnosis not present

## 2022-04-01 DIAGNOSIS — J029 Acute pharyngitis, unspecified: Secondary | ICD-10-CM | POA: Diagnosis not present

## 2022-04-01 DIAGNOSIS — E119 Type 2 diabetes mellitus without complications: Secondary | ICD-10-CM | POA: Diagnosis not present

## 2022-04-01 DIAGNOSIS — I110 Hypertensive heart disease with heart failure: Secondary | ICD-10-CM | POA: Diagnosis not present

## 2022-04-01 DIAGNOSIS — I509 Heart failure, unspecified: Secondary | ICD-10-CM | POA: Insufficient documentation

## 2022-04-01 DIAGNOSIS — R799 Abnormal finding of blood chemistry, unspecified: Secondary | ICD-10-CM | POA: Diagnosis present

## 2022-04-01 MED ORDER — DOXYCYCLINE HYCLATE 100 MG PO TABS
100.0000 mg | ORAL_TABLET | Freq: Once | ORAL | Status: AC
  Administered 2022-04-01: 100 mg via ORAL
  Filled 2022-04-01: qty 1

## 2022-04-01 MED ORDER — DOXYCYCLINE HYCLATE 50 MG PO CAPS: 100.0000 mg | ORAL_CAPSULE | Freq: Two times a day (BID) | ORAL | 0 refills | Status: AC

## 2022-04-01 NOTE — ED Triage Notes (Addendum)
Pt comes with c/o abnormal labs.  Pt states he was called by someone telling him to come to ED to get checked out.   Pt states the message stated they found something that was not right and he needed to come to ED.  Results reviewed and appears pt had +blood culture result.  Pt states dizziness and headache that he was just evaluated for and was suppose to start on medications. Pt has not had chance to pick up meds yet.

## 2022-04-01 NOTE — ED Provider Notes (Signed)
[facility] Provider Note   Event Date/Time   First MD Initiated Contact with Patient 04/01/22 1630     (approximate) History  Abnormal Lab  HPI Vannak Montenegro. is a 61 y.o. male with a past medical history of type 2 diabetes, CHF, hypertension, and chronic lymphedema who presents after being called by our facility for a positive blood culture.  Patient denies any new or worsening symptoms at this time and complains of continued sore throat.  Patient states that he does have mild fatigue ROS: Patient currently denies any vision changes, tinnitus, difficulty speaking, facial droop, chest pain, shortness of breath, abdominal pain, nausea/vomiting/diarrhea, dysuria, or weakness/numbness/paresthesias in any extremity   Physical Exam  Triage Vital Signs: ED Triage Vitals [04/01/22 1546]  Enc Vitals Group     BP 95/60     Pulse Rate 77     Resp 17     Temp 99 F (37.2 C)     Temp src      SpO2 98 %     Weight      Height      Head Circumference      Peak Flow      Pain Score 8     Pain Loc      Pain Edu?      Excl. in [place]?    Most recent vital signs: Vitals:   04/01/22 1546  BP: 95/60  Pulse: 77  Resp: 17  Temp: 99 F (37.2 C)  SpO2: 98%   General: Awake, oriented x4. CV:  Good peripheral perfusion.  Resp:  Normal effort.  Abd:  No distention.  Other:  Middle-aged obese Caucasian male laying in bed in no acute distress.  Erythematous posterior oropharynx ED Results / Procedures / Treatments  PROCEDURES: Critical Care performed: No Procedures MEDICATIONS ORDERED IN ED: Medications  doxycycline (VIBRA-TABS) tablet 100 mg (has no administration in time range)   IMPRESSION / MDM / ASSESSMENT AND PLAN / ED COURSE  I reviewed the triage vital signs and the nursing notes.                             The patient is on the cardiac monitor to evaluate for evidence of arrhythmia and/or significant heart rate changes. Patient's presentation  is most consistent with acute presentation with potential threat to life or bodily function. Patient is a 61 year old male with a past medical history as described above who presents after being called and told that he had a positive blood culture.  On chart review patient does have 1 out of 2 positive blood cultures for Staph epidermidis.  Patient has no new or worsening symptoms and therefore with only one positive blood culture, this is likely a contaminant.  Patient was given strict return precautions and at this point I have low suspicion for bacteremia.  I will switch patient's antibiotic from Keflex to doxycycline for broader staph coverage based on our local antibiotic gram. The patient has been reexamined and is ready to be discharged.  All diagnostic results have been reviewed and discussed with the patient/family.  Care plan has been outlined and the patient/family understands all current diagnoses, results, and treatment plans.  There are no new complaints, changes, or physical findings at this time.  All questions have been addressed and answered. All medications, if any, that were given while in the emergency department or any that are being prescribed have  been reviewed with the patient/family.  All side effects and adverse reactions have been explained.  Patient was instructed to, and agrees to follow-up with their primary care physician as well as return to the emergency department if any new or worsening symptoms develop.   FINAL CLINICAL IMPRESSION(S) / ED DIAGNOSES   Final diagnoses:  Pharyngitis, unspecified etiology  Positive blood culture   Rx / DC Orders   ED Discharge Orders          Ordered    doxycycline (VIBRAMYCIN) 50 MG capsule  2 times daily        04/01/22 1737           Note:  This document was prepared using Dragon voice recognition software and may include unintentional dictation errors.   Naaman Plummer, MD 04/01/22 [phone]

## 2022-04-01 NOTE — ED Notes (Signed)
Results reviewed with MD Joni Fears and possible BC were contaminated. No new orders at this time.

## 2022-04-02 LAB — BLOOD CULTURE ID PANEL (REFLEXED) - BCID2

## 2022-04-03 LAB — CULTURE, BLOOD (ROUTINE X 2): Special Requests: ADEQUATE

## 2022-04-05 LAB — CULTURE, BLOOD (ROUTINE X 2): Culture: NO GROWTH

## 2022-04-06 ENCOUNTER — Other Ambulatory Visit: Payer: Self-pay

## 2022-04-06 ENCOUNTER — Emergency Department
Admission: EM | Admit: 2022-04-06 | Discharge: 2022-04-07 | Disposition: A | Discharge status: Home / Self Care | Payer: Medicare Other | Attending: Emergency Medicine | Admitting: Emergency Medicine

## 2022-04-06 ENCOUNTER — Emergency Department: Payer: Medicare Other

## 2022-04-06 DIAGNOSIS — R531 Weakness: Secondary | ICD-10-CM | POA: Diagnosis present

## 2022-04-06 DIAGNOSIS — I110 Hypertensive heart disease with heart failure: Secondary | ICD-10-CM | POA: Insufficient documentation

## 2022-04-06 DIAGNOSIS — J101 Influenza due to other identified influenza virus with other respiratory manifestations: Secondary | ICD-10-CM | POA: Diagnosis not present

## 2022-04-06 DIAGNOSIS — R1084 Generalized abdominal pain: Secondary | ICD-10-CM | POA: Insufficient documentation

## 2022-04-06 DIAGNOSIS — I509 Heart failure, unspecified: Secondary | ICD-10-CM | POA: Insufficient documentation

## 2022-04-06 DIAGNOSIS — E119 Type 2 diabetes mellitus without complications: Secondary | ICD-10-CM | POA: Insufficient documentation

## 2022-04-06 DIAGNOSIS — R509 Fever, unspecified: Secondary | ICD-10-CM | POA: Diagnosis not present

## 2022-04-06 DIAGNOSIS — Z21 Asymptomatic human immunodeficiency virus [HIV] infection status: Secondary | ICD-10-CM | POA: Insufficient documentation

## 2022-04-06 DIAGNOSIS — J111 Influenza due to unidentified influenza virus with other respiratory manifestations: Secondary | ICD-10-CM

## 2022-04-06 DIAGNOSIS — Z20822 Contact with and (suspected) exposure to covid-19: Secondary | ICD-10-CM | POA: Insufficient documentation

## 2022-04-06 LAB — COMPREHENSIVE METABOLIC PANEL
ALT: 13 U/L (ref 0–44)
AST: 22 U/L (ref 15–41)
Albumin: 3.3 g/dL — ABNORMAL LOW (ref 3.5–5.0)
Alkaline Phosphatase: 84 U/L (ref 38–126)
Anion gap: 9 (ref 5–15)
BUN: 16 mg/dL (ref 8–23)
CO2: 25 mmol/L (ref 22–32)
Calcium: 8.9 mg/dL (ref 8.9–10.3)
Chloride: 106 mmol/L (ref 98–111)
Creatinine, Ser: 1.4 mg/dL — ABNORMAL HIGH (ref 0.61–1.24)
GFR, Estimated: 56 mL/min — ABNORMAL LOW (ref >60)
Glucose, Bld: 138 mg/dL — ABNORMAL HIGH (ref 70–99)
Potassium: 4.4 mmol/L (ref 3.5–5.1)
Sodium: 140 mmol/L (ref 135–145)
Total Bilirubin: 0.5 mg/dL (ref 0.3–1.2)
Total Protein: 7.3 g/dL (ref 6.5–8.1)

## 2022-04-06 LAB — RESP PANEL BY RT-PCR (FLU A&B, COVID) ARPGX2
Influenza A by PCR: NEGATIVE
Influenza B by PCR: NEGATIVE
SARS Coronavirus 2 by RT PCR: NEGATIVE

## 2022-04-06 LAB — CK: Total CK: 140 U/L (ref 49–397)

## 2022-04-06 LAB — URINALYSIS, ROUTINE W REFLEX MICROSCOPIC
Bilirubin Urine: NEGATIVE
Glucose, UA: NEGATIVE mg/dL
Hgb urine dipstick: NEGATIVE
Ketones, ur: NEGATIVE mg/dL
Leukocytes,Ua: NEGATIVE
Nitrite: NEGATIVE
Protein, ur: 100 mg/dL — AB
Specific Gravity, Urine: >1.046 — ABNORMAL HIGH (ref 1.005–1.030)
pH: 5 (ref 5.0–8.0)

## 2022-04-06 LAB — CBC WITH DIFFERENTIAL/PLATELET
Abs Immature Granulocytes: 0.08 10*3/uL — ABNORMAL HIGH (ref 0.00–0.07)
Basophils Absolute: 0.1 10*3/uL (ref 0.0–0.1)
Basophils Relative: 1 %
Eosinophils Absolute: 1.2 10*3/uL — ABNORMAL HIGH (ref 0.0–0.5)
Eosinophils Relative: 12 %
HCT: 35.4 % — ABNORMAL LOW (ref 39.0–52.0)
Hemoglobin: 11.8 g/dL — ABNORMAL LOW (ref 13.0–17.0)
Immature Granulocytes: 1 %
Lymphocytes Relative: 33 %
Lymphs Abs: 3.1 10*3/uL (ref 0.7–4.0)
MCH: 31.5 pg (ref 26.0–34.0)
MCHC: 33.3 g/dL (ref 30.0–36.0)
MCV: 94.4 fL (ref 80.0–100.0)
Monocytes Absolute: 1 10*3/uL (ref 0.1–1.0)
Monocytes Relative: 10 %
Neutro Abs: 4.1 10*3/uL (ref 1.7–7.7)
Neutrophils Relative %: 43 %
Platelets: 256 10*3/uL (ref 150–400)
RBC: 3.75 MIL/uL — ABNORMAL LOW (ref 4.22–5.81)
RDW: 12.2 % (ref 11.5–15.5)
Smear Review: NORMAL
WBC: 9.5 10*3/uL (ref 4.0–10.5)
nRBC: 0 % (ref 0.0–0.2)

## 2022-04-06 LAB — LIPASE, BLOOD: Lipase: 52 U/L — ABNORMAL HIGH (ref 11–51)

## 2022-04-06 LAB — LACTIC ACID, PLASMA: Lactic Acid, Venous: 1.9 mmol/L (ref 0.5–1.9)

## 2022-04-06 MED ORDER — ACETAMINOPHEN 500 MG PO TABS
1000.0000 mg | ORAL_TABLET | Freq: Once | ORAL | Status: AC
  Administered 2022-04-06: 1000 mg via ORAL
  Filled 2022-04-06: qty 2

## 2022-04-06 MED ORDER — IOHEXOL 300 MG/ML  SOLN
125.0000 mL | Freq: Once | INTRAMUSCULAR | Status: AC | PRN
  Administered 2022-04-06: 125 mL via INTRAVENOUS

## 2022-04-06 MED ORDER — SODIUM CHLORIDE 0.9 % IV BOLUS
1000.0000 mL | Freq: Once | INTRAVENOUS | Status: AC
  Administered 2022-04-06: 1000 mL via INTRAVENOUS

## 2022-04-06 NOTE — Discharge Instructions (Addendum)
Your ED evaluation today was unremarkable and reassuring, including labs and CT scans of your neck chest abdomen and pelvis.  Take Tylenol as needed for chills and body aches.  Follow-up with your doctor tomorrow for continued evaluation of your symptoms.

## 2022-04-06 NOTE — ED Triage Notes (Signed)
Pt was recently seen for sore throat and weakness. Was on abx for +blood culture for staph that he completed today. Pt states he had started to feel better while on treatment and then starting today worse with increase in weakness. Reports fever at home

## 2022-04-06 NOTE — ED Provider Notes (Signed)
[facility] Provider Note    Event Date/Time   First MD Initiated Contact with Patient 04/06/22 1755     (approximate)   History   Chief Complaint: Weakness   HPI  Danny Cook. is a 61 y.o. male with a history of hypertension, schizophrenia, diabetes, CHF, HIV, GERD who comes ED complaining of fever chills severe fatigue nausea since yesterday.  EMS reports that when they went to his house, the patient really struggled to come to the door to let them in.  Patient reports feeling lightheaded with standing.  No vomiting or diarrhea.  Has generalized abdominal pain but no other focal complaints.     Physical Exam   Triage Vital Signs: ED Triage Vitals  Enc Vitals Group     BP 04/06/22 1750 133/68     Pulse Rate 04/06/22 1750 69     Resp 04/06/22 1750 19     Temp 04/06/22 1750 98.9 F (37.2 C)     Temp Source 04/06/22 1750 Oral     SpO2 04/06/22 1750 96 %     Weight 04/06/22 1752 196 lb 3.4 oz (89 kg)     Height 04/06/22 1752 '5\' 7"'$  (1.702 m)     Head Circumference --      Peak Flow --      Pain Score 04/06/22 1752 0     Pain Loc --      Pain Edu? --      Excl. in [place]? --     Most recent vital signs: Vitals:   04/06/22 2317 04/06/22 2327  BP: 117/75   Pulse: 77   Resp:    Temp:  98.6 F (37 C)  SpO2: 96%     General: Awake, no distress.  Ill-appearing CV:  Good peripheral perfusion.  Regular rate and rhythm.  Symmetric distal pulses. Resp:  Normal effort.  Clear to auscultation bilaterally Abd:  No distention.  Soft with diffuse tenderness.  Not peritoneal. Other:  No lower extremity edema or calf tenderness.  No focal soft tissue inflammatory changes.  Skin of bilateral feet appears normal without any diabetic wounds or inflammatory changes.   ED Results / Procedures / Treatments   Labs (all labs ordered are listed, but only abnormal results are displayed) Labs Reviewed  COMPREHENSIVE METABOLIC PANEL - Abnormal; Notable  for the following components:      Result Value   Glucose, Bld 138 (*)    Creatinine, Ser 1.40 (*)    Albumin 3.3 (*)    GFR, Estimated 56 (*)    All other components within normal limits  CBC WITH DIFFERENTIAL/PLATELET - Abnormal; Notable for the following components:   RBC 3.75 (*)    Hemoglobin 11.8 (*)    HCT 35.4 (*)    Eosinophils Absolute 1.2 (*)    Abs Immature Granulocytes 0.08 (*)    All other components within normal limits  LIPASE, BLOOD - Abnormal; Notable for the following components:   Lipase 52 (*)    All other components within normal limits  URINALYSIS, ROUTINE W REFLEX MICROSCOPIC - Abnormal; Notable for the following components:   Color, Urine YELLOW (*)    APPearance CLEAR (*)    Specific Gravity, Urine >1.046 (*)    Protein, ur 100 (*)    Bacteria, UA RARE (*)    All other components within normal limits  RESP PANEL BY RT-PCR (FLU A&B, COVID) ARPGX2  CULTURE, BLOOD (ROUTINE X 2)  CULTURE, BLOOD (  ROUTINE X 2)  URINE CULTURE  LACTIC ACID, PLASMA  CK     EKG Interpreted by me Sinus rhythm rate of 63.  Normal axis, first-degree AV block.  Poor R wave progression.  Normal ST segments and T waves.   RADIOLOGY CT neck interpreted by me, negative for abscess or airway narrowing.  Radiology report reviewed.  CT chest abdomen pelvis negative for acute findings.   PROCEDURES:  Procedures   MEDICATIONS ORDERED IN ED: Medications  sodium chloride 0.9 % bolus 1,000 mL (0 mLs Intravenous Stopped 04/06/22 2100)  iohexol (OMNIPAQUE) 300 MG/ML solution 125 mL (125 mLs Intravenous Contrast Given 04/06/22 1926)  acetaminophen (TYLENOL) tablet 1,000 mg (1,000 mg Oral Given 04/06/22 2134)     IMPRESSION / MDM / ASSESSMENT AND PLAN / ED COURSE  I reviewed the triage vital signs and the nursing notes.                              Differential diagnosis includes, but is not limited to, pneumonia, intra-abdominal abscess, lymphadenitis, PTA, appendicitis,  diverticulitis, pancreatitis  Patient's presentation is most consistent with acute presentation with potential threat to life or bodily function.  Patient presents to the ED with multiple constitutional symptoms today along with fever of 102.7, despite recently completing course of Keflex and doxycycline related to pharyngitis symptoms.  He previously had a positive blood culture which was suspected to be contaminant due to being only 1 bottle and Staph epidermidis on ID.  We will repeat blood culture and labs today.  We will obtain CT scans of neck, chest abdomen pelvis to evaluate for occult infectious sources.   Clinical Course as of 04/06/22 2339  Nancy Fetter Apr 06, 2022  2014 CT neck, chest abdomen pelvis all unremarkable.  No acute findings.  Lactate is normal, serum lab panel is unremarkable, COVID and flu negative.  Awaiting urinalysis. [PS]    Clinical Course User Index [PS] Carrie Mew, MD    ----------------------------------------- 11:40 PM on 04/06/2022 ----------------------------------------- Urinalysis unremarkable.  Orthostatics normal.  Patient feeling better, tolerating oral intake.  Took a cola, applesauce and graham crackers.  Extensive work-up is all reassuring.  I suspect a viral illness causing his constitutional symptoms and malaise.  He is stable for discharge home to follow-up with primary care this week, Tylenol as needed.   FINAL CLINICAL IMPRESSION(S) / ED DIAGNOSES   Final diagnoses:  Weakness  Fever, unspecified fever cause  Influenza-like illness     Rx / DC Orders   ED Discharge Orders     None        Note:  This document was prepared using Dragon voice recognition software and may include unintentional dictation errors.   Carrie Mew, MD 04/06/22 2340

## 2022-04-08 LAB — URINE CULTURE: Culture: NO GROWTH

## 2022-04-11 LAB — CULTURE, BLOOD (ROUTINE X 2)
Culture: NO GROWTH
Culture: NO GROWTH
Special Requests: ADEQUATE

## 2022-04-14 ENCOUNTER — Emergency Department: Payer: Medicare Other

## 2022-04-14 ENCOUNTER — Other Ambulatory Visit: Payer: Self-pay

## 2022-04-14 ENCOUNTER — Emergency Department (EMERGENCY_DEPARTMENT_HOSPITAL)
Admission: EM | Admit: 2022-04-14 | Discharge: 2022-04-15 | Disposition: A | Discharge status: Other Acute Inpatient Hospital | Payer: Medicare Other | Source: Home / Self Care | Attending: Emergency Medicine | Admitting: Emergency Medicine

## 2022-04-14 ENCOUNTER — Encounter: Payer: Self-pay | Admitting: Emergency Medicine

## 2022-04-14 DIAGNOSIS — Z7984 Long term (current) use of oral hypoglycemic drugs: Secondary | ICD-10-CM | POA: Insufficient documentation

## 2022-04-14 DIAGNOSIS — F419 Anxiety disorder, unspecified: Secondary | ICD-10-CM | POA: Insufficient documentation

## 2022-04-14 DIAGNOSIS — F333 Major depressive disorder, recurrent, severe with psychotic symptoms: Secondary | ICD-10-CM | POA: Diagnosis not present

## 2022-04-14 DIAGNOSIS — R112 Nausea with vomiting, unspecified: Secondary | ICD-10-CM | POA: Insufficient documentation

## 2022-04-14 DIAGNOSIS — R45851 Suicidal ideations: Secondary | ICD-10-CM

## 2022-04-14 DIAGNOSIS — Z7982 Long term (current) use of aspirin: Secondary | ICD-10-CM | POA: Insufficient documentation

## 2022-04-14 DIAGNOSIS — Z20822 Contact with and (suspected) exposure to covid-19: Secondary | ICD-10-CM | POA: Insufficient documentation

## 2022-04-14 DIAGNOSIS — F251 Schizoaffective disorder, depressive type: Secondary | ICD-10-CM | POA: Insufficient documentation

## 2022-04-14 LAB — CBC
HCT: 36.9 % — ABNORMAL LOW (ref 39.0–52.0)
Hemoglobin: 12.7 g/dL — ABNORMAL LOW (ref 13.0–17.0)
MCH: 31.4 pg (ref 26.0–34.0)
MCHC: 34.4 g/dL (ref 30.0–36.0)
MCV: 91.3 fL (ref 80.0–100.0)
Platelets: 314 10*3/uL (ref 150–400)
RBC: 4.04 MIL/uL — ABNORMAL LOW (ref 4.22–5.81)
RDW: 12.5 % (ref 11.5–15.5)
WBC: 9.2 10*3/uL (ref 4.0–10.5)
nRBC: 0 % (ref 0.0–0.2)

## 2022-04-14 LAB — COMPREHENSIVE METABOLIC PANEL
ALT: 16 U/L (ref 0–44)
AST: 22 U/L (ref 15–41)
Albumin: 3.7 g/dL (ref 3.5–5.0)
Alkaline Phosphatase: 87 U/L (ref 38–126)
Anion gap: 12 (ref 5–15)
BUN: 18 mg/dL (ref 8–23)
CO2: 22 mmol/L (ref 22–32)
Calcium: 9.8 mg/dL (ref 8.9–10.3)
Chloride: 105 mmol/L (ref 98–111)
Creatinine, Ser: 1.13 mg/dL (ref 0.61–1.24)
GFR, Estimated: >60 mL/min (ref >60)
Glucose, Bld: 157 mg/dL — ABNORMAL HIGH (ref 70–99)
Potassium: 4 mmol/L (ref 3.5–5.1)
Sodium: 139 mmol/L (ref 135–145)
Total Bilirubin: 0.6 mg/dL (ref 0.3–1.2)
Total Protein: 7.7 g/dL (ref 6.5–8.1)

## 2022-04-14 LAB — URINE DRUG SCREEN, QUALITATIVE (ARMC ONLY)
Amphetamines, Ur Screen: NOT DETECTED
Barbiturates, Ur Screen: NOT DETECTED
Benzodiazepine, Ur Scrn: POSITIVE — AB
Cannabinoid 50 Ng, Ur ~~LOC~~: NOT DETECTED
Cocaine Metabolite,Ur ~~LOC~~: NOT DETECTED
MDMA (Ecstasy)Ur Screen: NOT DETECTED
Methadone Scn, Ur: NOT DETECTED
Opiate, Ur Screen: NOT DETECTED
Phencyclidine (PCP) Ur S: NOT DETECTED
Tricyclic, Ur Screen: NOT DETECTED

## 2022-04-14 LAB — CBG MONITORING, ED
Glucose-Capillary: 123 mg/dL — ABNORMAL HIGH (ref 70–99)
Glucose-Capillary: 86 mg/dL (ref 70–99)

## 2022-04-14 LAB — LIPASE, BLOOD: Lipase: 44 U/L (ref 11–51)

## 2022-04-14 LAB — RESP PANEL BY RT-PCR (FLU A&B, COVID) ARPGX2
Influenza A by PCR: NEGATIVE
Influenza B by PCR: NEGATIVE
SARS Coronavirus 2 by RT PCR: NEGATIVE

## 2022-04-14 LAB — ETHANOL: Alcohol, Ethyl (B): <10 mg/dL (ref <10)

## 2022-04-14 LAB — SALICYLATE LEVEL: Salicylate Lvl: <7 mg/dL — ABNORMAL LOW (ref 7.0–30.0)

## 2022-04-14 LAB — ACETAMINOPHEN LEVEL: Acetaminophen (Tylenol), Serum: <10 ug/mL — ABNORMAL LOW (ref 10–30)

## 2022-04-14 LAB — VALPROIC ACID LEVEL: Valproic Acid Lvl: <10 ug/mL — ABNORMAL LOW (ref 50.0–100.0)

## 2022-04-14 MED ORDER — IOHEXOL 300 MG/ML  SOLN
100.0000 mL | Freq: Once | INTRAMUSCULAR | Status: AC | PRN
  Administered 2022-04-14: 100 mL via INTRAVENOUS

## 2022-04-14 MED ORDER — LOSARTAN POTASSIUM 50 MG PO TABS
25.0000 mg | ORAL_TABLET | Freq: Every day | ORAL | Status: DC
  Administered 2022-04-14: 25 mg via ORAL
  Filled 2022-04-14: qty 1

## 2022-04-14 MED ORDER — ALBUTEROL SULFATE HFA 108 (90 BASE) MCG/ACT IN AERS: 2.0000 | INHALATION_SPRAY | Freq: Four times a day (QID) | RESPIRATORY_TRACT | Status: DC | PRN

## 2022-04-14 MED ORDER — ABACAVIR-DOLUTEGRAVIR-LAMIVUD 600-50-300 MG PO TABS
1.0000 | ORAL_TABLET | Freq: Every day | ORAL | Status: DC
  Administered 2022-04-14: 1 via ORAL
  Filled 2022-04-14: qty 1

## 2022-04-14 MED ORDER — METFORMIN HCL 500 MG PO TABS: 1000.0000 mg | ORAL_TABLET | Freq: Two times a day (BID) | ORAL | Status: DC

## 2022-04-14 MED ORDER — ONDANSETRON HCL 4 MG/2ML IJ SOLN
4.0000 mg | Freq: Once | INTRAMUSCULAR | Status: AC
  Administered 2022-04-14: 4 mg via INTRAVENOUS
  Filled 2022-04-14: qty 2

## 2022-04-14 MED ORDER — DIVALPROEX SODIUM ER 250 MG PO TB24
500.0000 mg | ORAL_TABLET | Freq: Every day | ORAL | Status: DC
  Administered 2022-04-14: 500 mg via ORAL
  Filled 2022-04-14: qty 2

## 2022-04-14 MED ORDER — VENLAFAXINE HCL 37.5 MG PO TABS
37.5000 mg | ORAL_TABLET | Freq: Two times a day (BID) | ORAL | Status: DC
  Administered 2022-04-14: 37.5 mg via ORAL
  Filled 2022-04-14: qty 1

## 2022-04-14 MED ORDER — MIRTAZAPINE 15 MG PO TABS
30.0000 mg | ORAL_TABLET | Freq: Every day | ORAL | Status: DC
  Administered 2022-04-14: 30 mg via ORAL
  Filled 2022-04-14: qty 2

## 2022-04-14 MED ORDER — BENZTROPINE MESYLATE 1 MG PO TABS: 1.5000 mg | ORAL_TABLET | Freq: Every day | ORAL | Status: DC

## 2022-04-14 MED ORDER — DONEPEZIL HCL 5 MG PO TABS
5.0000 mg | ORAL_TABLET | Freq: Every day | ORAL | Status: DC
  Administered 2022-04-14: 5 mg via ORAL
  Filled 2022-04-14: qty 1

## 2022-04-14 MED ORDER — ASPIRIN 325 MG PO TABS
325.0000 mg | ORAL_TABLET | Freq: Every day | ORAL | Status: DC
  Administered 2022-04-14: 325 mg via ORAL
  Filled 2022-04-14: qty 1

## 2022-04-14 MED ORDER — DIAZEPAM 5 MG PO TABS
5.0000 mg | ORAL_TABLET | Freq: Two times a day (BID) | ORAL | Status: DC
  Administered 2022-04-14: 5 mg via ORAL
  Filled 2022-04-14: qty 1

## 2022-04-14 MED ORDER — FLUTICASONE PROPIONATE HFA 220 MCG/ACT IN AERO
2.0000 | INHALATION_SPRAY | Freq: Two times a day (BID) | RESPIRATORY_TRACT | Status: DC
  Administered 2022-04-14: 2 via RESPIRATORY_TRACT
  Filled 2022-04-14: qty 12

## 2022-04-14 MED ORDER — ZIPRASIDONE HCL 40 MG PO CAPS
40.0000 mg | ORAL_CAPSULE | Freq: Every day | ORAL | Status: DC
  Administered 2022-04-14: 40 mg via ORAL
  Filled 2022-04-14 (×2): qty 1

## 2022-04-14 MED ORDER — DICYCLOMINE HCL 10 MG PO CAPS
10.0000 mg | ORAL_CAPSULE | Freq: Three times a day (TID) | ORAL | Status: DC
  Administered 2022-04-14 (×2): 10 mg via ORAL
  Filled 2022-04-14 (×2): qty 1

## 2022-04-14 MED ORDER — GABAPENTIN 400 MG PO CAPS
800.0000 mg | ORAL_CAPSULE | Freq: Three times a day (TID) | ORAL | Status: DC
  Administered 2022-04-14: 800 mg via ORAL
  Filled 2022-04-14: qty 2

## 2022-04-14 MED ORDER — MEMANTINE HCL 5 MG PO TABS
10.0000 mg | ORAL_TABLET | Freq: Two times a day (BID) | ORAL | Status: DC
  Administered 2022-04-14: 10 mg via ORAL
  Filled 2022-04-14: qty 2

## 2022-04-14 MED ORDER — VENLAFAXINE HCL ER 75 MG PO CP24
75.0000 mg | ORAL_CAPSULE | Freq: Every day | ORAL | Status: DC
  Filled 2022-04-14: qty 1

## 2022-04-14 NOTE — ED Notes (Signed)
Patient states he has been having a stomach ache for a couple of days and has not been eating. He states he tried to eat breakfast this morning and got sick.  Patient was given some saltine crackers and sherbet and instructed to eat slow and only a few bites so he does not over do it.

## 2022-04-14 NOTE — ED Notes (Signed)
Patient transported to CT 

## 2022-04-14 NOTE — ED Notes (Signed)
Pt dressed out by this writer, Myra EDT, and Catilin EDT in burgundy scrubs and yellow non-skid socks.   Pt belongings:   Pearline Cables sneakers Green long-sleeve shirt Tan cargo shorts  [employer]  Black belt White socks [employer] with keys attached   Pts belongings placed in belongings bag.

## 2022-04-14 NOTE — ED Triage Notes (Signed)
Patient to ED via [facility] from home for suicidal thoughts. Patient states that SI thoughts started this AM and had thoughts of overdosing. Hx of anxiety and schizophrenia. Patient has not been taking his medications for the past 3 days.

## 2022-04-14 NOTE — ED Notes (Signed)
Patient gave permission for writer to update brother [place].  Writer called Franne Grip with update that patient is to be admitted for psych services.

## 2022-04-14 NOTE — ED Provider Notes (Signed)
[facility] Provider Note    Event Date/Time   First MD Initiated Contact with Patient 04/14/22 1527     (approximate)   History   Suicidal   HPI  Danny Cook. is a 61 y.o. male who reports suicidal thoughts he is thinking of overdosing.  Apparently where he is living things are in an upper or because to him and are having a disagreement with him he does not really want to talk about much more than that.  He has not taken his HIV meds for 3 days because the pharmacy has not sent them in yet.  He sees Dr. Ola Spurr he saiD for his HIV.  He says because things are in such an upper or he has been so anxious that he has been having nausea and vomiting and lost 13 pounds in the last week.  He is not complaining of any belly pain.      Physical Exam   Triage Vital Signs: ED Triage Vitals [04/14/22 1500]  Enc Vitals Group     BP (!) 154/86     Pulse Rate 74     Resp 18     Temp 97.9 F (36.6 C)     Temp Source Oral     SpO2 97 %     Weight      Height      Head Circumference      Peak Flow      Pain Score 0     Pain Loc      Pain Edu?      Excl. in [place]?     Most recent vital signs: Vitals:   04/14/22 1500 04/14/22 1949  BP: (!) 154/86 139/74  Pulse: 74 62  Resp: 18 16  Temp: 97.9 F (36.6 C) 98.1 F (36.7 C)  SpO2: 97% 95%     General: Awake, no distress.  CV:  Good peripheral perfusion.  Heart regular rate and rhythm no audible murmurs Resp:  Normal effort.  Lungs are clear Abd:  No distention.  Soft and nontender    ED Results / Procedures / Treatments   Labs (all labs ordered are listed, but only abnormal results are displayed) Labs Reviewed  COMPREHENSIVE METABOLIC PANEL - Abnormal; Notable for the following components:      Result Value   Glucose, Bld 157 (*)    All other components within normal limits  SALICYLATE LEVEL - Abnormal; Notable for the following components:   Salicylate Lvl <7.6 (*)    All other  components within normal limits  ACETAMINOPHEN LEVEL - Abnormal; Notable for the following components:   Acetaminophen (Tylenol), Serum <10 (*)    All other components within normal limits  CBC - Abnormal; Notable for the following components:   RBC 4.04 (*)    Hemoglobin 12.7 (*)    HCT 36.9 (*)    All other components within normal limits  URINE DRUG SCREEN, QUALITATIVE ([facility] ONLY) - Abnormal; Notable for the following components:   Benzodiazepine, Ur Scrn POSITIVE (*)    All other components within normal limits  VALPROIC ACID LEVEL - Abnormal; Notable for the following components:   Valproic Acid Lvl <10 (*)    All other components within normal limits  CBG MONITORING, ED - Abnormal; Notable for the following components:   Glucose-Capillary 123 (*)    All other components within normal limits  RESP PANEL BY RT-PCR (FLU A&B, COVID) ARPGX2  ETHANOL  LIPASE, BLOOD  CBG MONITORING, ED  CBG MONITORING, ED  CBG MONITORING, ED     EKG     RADIOLOGY   PROCEDURES:  Critical Care performed:   Procedures   MEDICATIONS ORDERED IN ED: Medications  abacavir-dolutegravir-lamiVUDine (TRIUMEQ) 600-50-300 MG per tablet 1 tablet (1 tablet Oral Given 04/14/22 2209)  albuterol (VENTOLIN HFA) 108 (90 Base) MCG/ACT inhaler 2 puff (has no administration in time range)  aspirin tablet 325 mg (325 mg Oral Given 04/14/22 1721)  divalproex (DEPAKOTE ER) 24 hr tablet 500 mg (500 mg Oral Given 04/14/22 2211)  dicyclomine (BENTYL) capsule 10 mg (10 mg Oral Given 04/14/22 2210)  donepezil (ARICEPT) tablet 5 mg (5 mg Oral Given 04/14/22 1721)  fluticasone (FLOVENT HFA) 220 MCG/ACT inhaler 2 puff (2 puffs Inhalation Given 04/14/22 2210)  gabapentin (NEURONTIN) capsule 800 mg (800 mg Oral Given 04/14/22 2210)  losartan (COZAAR) tablet 25 mg (25 mg Oral Given 04/14/22 1721)  metFORMIN (GLUCOPHAGE) tablet 1,000 mg (has no administration in time range)  ziprasidone (GEODON) capsule 40 mg (40 mg  Oral Given 04/14/22 2057)  venlafaxine (EFFEXOR) tablet 37.5 mg (37.5 mg Oral Given 04/14/22 2210)  mirtazapine (REMERON) tablet 30 mg (30 mg Oral Given 04/14/22 2211)  memantine (NAMENDA) tablet 10 mg (10 mg Oral Given 04/14/22 2210)  venlafaxine XR (EFFEXOR-XR) 24 hr capsule 75 mg (has no administration in time range)  benztropine (COGENTIN) tablet 1.5 mg (has no administration in time range)  diazepam (VALIUM) tablet 5 mg (5 mg Oral Given 04/14/22 2211)  ondansetron (ZOFRAN) injection 4 mg (4 mg Intravenous Given 04/14/22 1658)  iohexol (OMNIPAQUE) 300 MG/ML solution 100 mL (100 mLs Intravenous Contrast Given 04/14/22 1709)     IMPRESSION / MDM / ASSESSMENT AND PLAN / ED COURSE  I reviewed the triage vital signs and the nursing notes.  Patient has been without his HIV medicine for 3 days.  Recommend in the morning to consult ID to see if there is anything they want to change.   Patient's presentation is most consistent with acute presentation with potential threat to life or bodily function.     FINAL CLINICAL IMPRESSION(S) / ED DIAGNOSES   Final diagnoses:  Suicidal thoughts     Rx / DC Orders   ED Discharge Orders     None        Note:  This document was prepared using Dragon voice recognition software and may include unintentional dictation errors.   Nena Polio, MD 04/14/22 2308

## 2022-04-14 NOTE — Consult Note (Signed)
[facility] Consult   Reason for Consult:  suicidal thoughts Referring Physician:  Cinda Quest Patient Identification: Danny Cook. MRN:  [id number] Principal Diagnosis: Suicidal ideation Diagnosis:  Principal Problem:   Suicidal ideation Active Problems:   Schizoaffective disorder, depressive type ([place])   Total Time spent with patient: 45 minutes  Subjective: "I have a bottle of valium and I was going to take it but decided to call EMS and come in."  Danny Cook. is a 61 y.o. male patient admitted with suicidal thoughts. Patient presents to ED via EMS. For suicidal thoughts.    HPI:  Patient presented to ED via EMS for suicidal thoughts. On evaluation, patient has very sad, flat affect. He reports being out of his medications for 3 days because the pharmacy was not able to deliver it.  He reports they were going to deliver today but he got so "scared" and "paranoid" that he was going to kill himself that he wanted to come to the emergency department.  He states that he cannot eat, is so nervous, and he has been throwing up.  (EDP is aware).  He reports that he has not been sleeping and has very poor appetite.  He reports that he lives alone.  Denies homicidal ideations, auditory or visual hallucinations.  Patient states that he has been hospitalized in this facility before, more than 5 years ago.  He reports that it was for a similar presentation, when he ran out of his medications.  Past Psychiatric History: Schizophrenia; schizoaffective disorder, depressive type; depression; anxiety  Risk to Self:   Risk to Others:   Prior Inpatient Therapy:   Prior Outpatient Therapy:    Past Medical History:  Past Medical History:  Diagnosis Date   Anemia    Anxiety    Arthritis    CHF (congestive heart failure) ([place])    Chronic kidney disease    Renal Insufficiency Syndrome; Glomerulosclerosis 2013   Depressed    Diabetes mellitus without complication  (HCC)    GERD (gastroesophageal reflux disease)    Headache    High cholesterol    History of kidney stones    HIV (human immunodeficiency virus infection) ([place])    Hypertension    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[place])    Myocardial infarction ([place])    Paranoid disorder ([place])    Pneumonia    Schizophrenia, paranoid ([place])    Sleep apnea    Stroke ([place])    mini TIA   TIA (transient ischemic attack)     Past Surgical History:  Procedure Laterality Date   CARDIAC CATHETERIZATION     CATARACT EXTRACTION W/PHACO Right 04/25/2021   Procedure: CATARACT EXTRACTION PHACO AND INTRAOCULAR LENS PLACEMENT ([place]) RIGHT DIABETIC;  Surgeon: Birder Robson, MD;  Location: [facility] ORS;  Service: Ophthalmology;  Laterality: Right;  3.32 0:23.0   CATARACT EXTRACTION W/PHACO Left 09/19/2021   Procedure: CATARACT EXTRACTION PHACO AND INTRAOCULAR LENS PLACEMENT ([place]) LEFT DIABETIC;  Surgeon: Birder Robson, MD;  Location: [facility] ORS;  Service: Ophthalmology;  Laterality: Left;  6.73 0:49.1   COLONOSCOPY WITH PROPOFOL N/A 09/03/2015   Procedure: COLONOSCOPY WITH PROPOFOL;  Surgeon: Lollie Sails, MD;  Location: [facility] ENDOSCOPY;  Service: Endoscopy;  Laterality: N/A;   COLONOSCOPY WITH PROPOFOL N/A 09/17/2020   Procedure: COLONOSCOPY WITH PROPOFOL;  Surgeon: Lesly Rubenstein, MD;  Location: [facility] ENDOSCOPY;  Service: Endoscopy;  Laterality: N/A;  PREFERS 9AM OR LATER DUE TO TRANSPORTATION   DG TEETH FULL     ESOPHAGOGASTRODUODENOSCOPY (  EGD) WITH PROPOFOL N/A 07/01/2016   Dr. [name], [facility]. abnormal esophageal motility, suspicious for presbyesophagus, bile gastritis s/p biopsy, non-bleeding erosive gastropathy s/p biopsy, normal duodenum, path with negative H.pylori and +chronic active gastritis   ESOPHAGOGASTRODUODENOSCOPY (EGD) WITH PROPOFOL N/A 09/17/2020   Procedure: ESOPHAGOGASTRODUODENOSCOPY (EGD) WITH PROPOFOL;  Surgeon: Lesly Rubenstein, MD;  Location: [facility] ENDOSCOPY;  Service: Endoscopy;  Laterality: N/A;    EXTRACORPOREAL SHOCK WAVE LITHOTRIPSY Right 11/01/2020   Procedure: EXTRACORPOREAL SHOCK WAVE LITHOTRIPSY (ESWL);  Surgeon: Abbie Sons, MD;  Location: [facility] ORS;  Service: Urology;  Laterality: Right;   LUMBAR LAMINECTOMY/DECOMPRESSION MICRODISCECTOMY N/A 07/15/2021   Procedure: L3-5 DECOMPRESSION;  Surgeon: Meade Maw, MD;  Location: [facility] ORS;  Service: Neurosurgery;  Laterality: N/A;   Family History:  Family History  Problem Relation Age of Onset   Heart attack Mother    Diabetes Mellitus II Mother    Mental illness Mother    CAD Mother    Heart attack Father    CAD Father    Hypertension Father    Family Psychiatric  History: Mother had depression and anxiety Social History:  Social History   Substance and Sexual Activity  Alcohol Use No     Social History   Substance and Sexual Activity  Drug Use No   Comment: denies street drugs    Social History   Socioeconomic History   Marital status: Single    Spouse name: Not on file   Number of children: Not on file   Years of education: 11   Highest education level: GED or equivalent  Occupational History   Not on file  Tobacco Use   Smoking status: Never   Smokeless tobacco: Never  Vaping Use   Vaping Use: Never used  Substance and Sexual Activity   Alcohol use: No   Drug use: No    Comment: denies street drugs   Sexual activity: Never  Other Topics Concern   Not on file  Social History Narrative   Not on file   Social Determinants of Health   Financial Resource Strain: Low Risk  (08/12/2017)   Overall Financial Resource Strain (CARDIA)    Difficulty of Paying Living Expenses: Not hard at all  Food Insecurity: Food Insecurity Present (08/12/2017)   Hunger Vital Sign    Worried About [place] in the Last Year: Sometimes true    Ran Out of Food in the Last Year: Never true  Transportation Needs: No Transportation Needs (08/12/2017)   PRAPARE - Hydrologist  (Medical): No    Lack of Transportation (Non-Medical): No  Physical Activity: Inactive (08/12/2017)   Exercise Vital Sign    Days of Exercise per Week: 0 days    Minutes of Exercise per Session: 0 min  Stress: Stress Concern Present (08/12/2017)   [place]    Feeling of Stress : To some extent  Social Connections: Moderately Isolated (08/12/2017)   Social Connection and Isolation Panel [NHANES]    Frequency of Communication with Friends and Family: More than three times a week    Frequency of Social Gatherings with Friends and Family: Three times a week    Attends Religious Services: Never    Active Member of Clubs or Organizations: No    Attends Archivist Meetings: Never    Marital Status: Never married   Additional Social History:    Allergies:   Allergies  Allergen Reactions  Trazodone And Nefazodone     Affects other meds that they dont work   Viread [Tenofovir Disoproxil]     Damages kidneys   Etodolac Rash    Labs:  Results for orders placed or performed during the hospital encounter of 04/14/22 (from the past 48 hour(s))  Comprehensive metabolic panel     Status: Abnormal   Collection Time: 04/14/22  3:08 PM  Result Value Ref Range   Sodium 139 135 - 145 mmol/L   Potassium 4.0 3.5 - 5.1 mmol/L   Chloride 105 98 - 111 mmol/L   CO2 22 22 - 32 mmol/L   Glucose, Bld 157 (H) 70 - 99 mg/dL    Comment: Glucose reference range applies only to samples taken after fasting for at least 8 hours.   BUN 18 8 - 23 mg/dL   Creatinine, Ser 1.13 0.61 - 1.24 mg/dL   Calcium 9.8 8.9 - 10.3 mg/dL   Total Protein 7.7 6.5 - 8.1 g/dL   Albumin 3.7 3.5 - 5.0 g/dL   AST 22 15 - 41 U/L   ALT 16 0 - 44 U/L   Alkaline Phosphatase 87 38 - 126 U/L   Total Bilirubin 0.6 0.3 - 1.2 mg/dL   GFR, Estimated >60 >60 mL/min    Comment: (NOTE) Calculated using the CKD-EPI Creatinine Equation (2021)    Anion gap 12 5 -  15    Comment: Performed at [facility], [address] [place], [place] [address]  Ethanol     Status: None   Collection Time: 04/14/22  3:08 PM  Result Value Ref Range   Alcohol, Ethyl (B) <10 <10 mg/dL    Comment: (NOTE) Lowest detectable limit for serum alcohol is 10 mg/dL.  For medical purposes only. Performed at [facility], [address] [place], [place] [address]   Salicylate level     Status: Abnormal   Collection Time: 04/14/22  3:08 PM  Result Value Ref Range   Salicylate Lvl <9.5 (L) 7.0 - 30.0 mg/dL    Comment: Performed at [facility], [place], [place] [address]  Acetaminophen level     Status: Abnormal   Collection Time: 04/14/22  3:08 PM  Result Value Ref Range   Acetaminophen (Tylenol), Serum <10 (L) 10 - 30 ug/mL    Comment: (NOTE) Therapeutic concentrations vary significantly. A range of 10-30 ug/mL  may be an effective concentration for many patients. However, some  are best treated at concentrations outside of this range. Acetaminophen concentrations >150 ug/mL at 4 hours after ingestion  and >50 ug/mL at 12 hours after ingestion are often associated with  toxic reactions.  Performed at [facility], [address] [place], [place] [address]   cbc     Status: Abnormal   Collection Time: 04/14/22  3:08 PM  Result Value Ref Range   WBC 9.2 4.0 - 10.5 K/uL   RBC 4.04 (L) 4.22 - 5.81 MIL/uL   Hemoglobin 12.7 (L) 13.0 - 17.0 g/dL   HCT 36.9 (L) 39.0 - 52.0 %   MCV 91.3 80.0 - 100.0 fL   MCH 31.4 26.0 - 34.0 pg   MCHC 34.4 30.0 - 36.0 g/dL   RDW 12.5 11.5 - 15.5 %   Platelets 314 150 - 400 K/uL   nRBC 0.0 0.0 - 0.2 %    Comment: Performed at [facility], [address], [place], [place] [address]  Urine Drug Screen, Qualitative  Status: Abnormal   Collection Time: 04/14/22  3:08 PM  Result Value Ref Range   Tricyclic, Ur Screen NONE DETECTED NONE DETECTED   Amphetamines, Ur  Screen NONE DETECTED NONE DETECTED   MDMA (Ecstasy)Ur Screen NONE DETECTED NONE DETECTED   Cocaine Metabolite,Ur [place] NONE DETECTED NONE DETECTED   Opiate, Ur Screen NONE DETECTED NONE DETECTED   Phencyclidine (PCP) Ur S NONE DETECTED NONE DETECTED   Cannabinoid 50 Ng, Ur [place] NONE DETECTED NONE DETECTED   Barbiturates, Ur Screen NONE DETECTED NONE DETECTED   Benzodiazepine, Ur Scrn POSITIVE (A) NONE DETECTED   Methadone Scn, Ur NONE DETECTED NONE DETECTED    Comment: (NOTE) Tricyclics + metabolites, urine    Cutoff 1000 ng/mL Amphetamines + metabolites, urine  Cutoff 1000 ng/mL MDMA (Ecstasy), urine              Cutoff 500 ng/mL Cocaine Metabolite, urine          Cutoff 300 ng/mL Opiate + metabolites, urine        Cutoff 300 ng/mL Phencyclidine (PCP), urine         Cutoff 25 ng/mL Cannabinoid, urine                 Cutoff 50 ng/mL Barbiturates + metabolites, urine  Cutoff 200 ng/mL Benzodiazepine, urine              Cutoff 200 ng/mL Methadone, urine                   Cutoff 300 ng/mL  The urine drug screen provides only a preliminary, unconfirmed analytical test result and should not be used for non-medical purposes. Clinical consideration and professional judgment should be applied to any positive drug screen result due to possible interfering substances. A more specific alternate chemical method must be used in order to obtain a confirmed analytical result. Gas chromatography / mass spectrometry (GC/MS) is the preferred confirm atory method. Performed at [facility], [address] [place], [place] [address]   Lipase, blood     Status: None   Collection Time: 04/14/22  3:08 PM  Result Value Ref Range   Lipase 44 11 - 51 U/L    Comment: Performed at [facility], [address] [place], [place] [address]  Resp Panel by RT-PCR (Flu A&B, Covid) Anterior Nasal Swab     Status: None   Collection Time: 04/14/22  3:10 PM   Specimen: Anterior Nasal Swab  Result  Value Ref Range   SARS Coronavirus 2 by RT PCR NEGATIVE NEGATIVE    Comment: (NOTE) SARS-CoV-2 target nucleic acids are NOT DETECTED.  The SARS-CoV-2 RNA is generally detectable in upper respiratory specimens during the acute phase of infection. The lowest concentration of SARS-CoV-2 viral copies this assay can detect is 138 copies/mL. A negative result does not preclude SARS-Cov-2 infection and should not be used as the sole basis for treatment or other patient management decisions. A negative result may occur with  improper specimen collection/handling, submission of specimen other than nasopharyngeal swab, presence of viral mutation(s) within the areas targeted by this assay, and inadequate number of viral copies(<138 copies/mL). A negative result must be combined with clinical observations, patient history, and epidemiological information. The expected result is Negative.  Fact Sheet for Patients:  EntrepreneurPulse.com.au  Fact Sheet for Healthcare Providers:  IncredibleEmployment.be  This test is no t yet approved or cleared by the [place] FDA and  has been authorized for detection and/or diagnosis of  SARS-CoV-2 by FDA under an Emergency Use Authorization (EUA). This EUA will remain  in effect (meaning this test can be used) for the duration of the COVID-19 declaration under Section 564(b)(1) of the Act, 21 U.S.C.section 360bbb-3(b)(1), unless the authorization is terminated  or revoked sooner.       Influenza A by PCR NEGATIVE NEGATIVE   Influenza B by PCR NEGATIVE NEGATIVE    Comment: (NOTE) The Xpert Xpress SARS-CoV-2/FLU/RSV plus assay is intended as an aid in the diagnosis of influenza from Nasopharyngeal swab specimens and should not be used as a sole basis for treatment. Nasal washings and aspirates are unacceptable for Xpert Xpress SARS-CoV-2/FLU/RSV testing.  Fact Sheet for  Patients: EntrepreneurPulse.com.au  Fact Sheet for Healthcare Providers: IncredibleEmployment.be  This test is not yet approved or cleared by the [place] FDA and has been authorized for detection and/or diagnosis of SARS-CoV-2 by FDA under an Emergency Use Authorization (EUA). This EUA will remain in effect (meaning this test can be used) for the duration of the COVID-19 declaration under Section 564(b)(1) of the Act, 21 U.S.C. section 360bbb-3(b)(1), unless the authorization is terminated or revoked.  Performed at [facility], [address] [place], [place] [address]   Valproic acid level     Status: Abnormal   Collection Time: 04/14/22  3:18 PM  Result Value Ref Range   Valproic Acid Lvl <10 (L) 50.0 - 100.0 ug/mL    Comment: RESULTS CHECKED. QSD Performed at [facility], [address] [place], [place] [address]   POC CBG, ED     Status: Abnormal   Collection Time: 04/14/22  4:18 PM  Result Value Ref Range   Glucose-Capillary 123 (H) 70 - 99 mg/dL    Comment: Glucose reference range applies only to samples taken after fasting for at least 8 hours.    Current Facility-Administered Medications  Medication Dose Route Frequency Provider Last Rate Last Admin   abacavir-dolutegravir-lamiVUDine (TRIUMEQ) [phone] MG per tablet 1 tablet  1 tablet Oral QHS Nena Polio, MD       albuterol (VENTOLIN HFA) 108 (90 Base) MCG/ACT inhaler 2 puff  2 puff Inhalation Q6H PRN Nena Polio, MD       aspirin tablet 325 mg  325 mg Oral Daily Nena Polio, MD   325 mg at 04/14/22 1721   dicyclomine (BENTYL) capsule 10 mg  10 mg Oral TID AC & HS Nena Polio, MD   10 mg at 04/14/22 1721   divalproex (DEPAKOTE ER) 24 hr tablet 500 mg  500 mg Oral QHS Nena Polio, MD       donepezil (ARICEPT) tablet 5 mg  5 mg Oral Daily Nena Polio, MD   5 mg at 04/14/22 1721   fluticasone (FLOVENT HFA) 220 MCG/ACT inhaler 2 puff   2 puff Inhalation BID Nena Polio, MD       gabapentin (NEURONTIN) capsule 800 mg  800 mg Oral TID Nena Polio, MD       losartan (COZAAR) tablet 25 mg  25 mg Oral Daily Nena Polio, MD   25 mg at 04/14/22 1721   [START ON 04/16/2022] metFORMIN (GLUCOPHAGE) tablet 1,000 mg  1,000 mg Oral BID WC Nena Polio, MD       Current Outpatient Medications  Medication Sig Dispense Refill   albuterol (PROVENTIL HFA;VENTOLIN HFA) 108 (90 Base) MCG/ACT inhaler Inhale 2 puffs into the lungs every 6 (six) hours as needed for wheezing  or shortness of breath. 1 Inhaler 2   aspirin 325 MG tablet Take 325 mg by mouth daily.     brimonidine-timolol (COMBIGAN) 0.2-0.5 % ophthalmic solution Place 1 drop into both eyes every 12 (twelve) hours.     diazepam (VALIUM) 5 MG tablet Take 2.5-5 mg by mouth See admin instructions. 5 mg in the morning, 2.5 mg midday, 5 mg in the evening     dicyclomine (BENTYL) 10 MG capsule Take 10 mg by mouth 4 (four) times daily -  before meals and at bedtime.     donepezil (ARICEPT) 10 MG tablet Take 5 mg by mouth daily.     Fluocinolone Acetonide 0.01 % OIL Place 2-4 drops in ear(s) every other day. At bedtime for dry skin     fluticasone (FLOVENT HFA) 220 MCG/ACT inhaler Inhale 2 puffs into the lungs 2 (two) times daily. Rinse out mouth afterwards 1 Inhaler 12   gabapentin (NEURONTIN) 600 MG tablet Take 800 mg by mouth 3 (three) times daily.     gemfibrozil (LOPID) 600 MG tablet Take 600 mg by mouth 2 (two) times daily before a meal.     glimepiride (AMARYL) 4 MG tablet Take 4 mg by mouth daily with breakfast.      glycopyrrolate (ROBINUL) 2 MG tablet Take 0.5 mg by mouth 2 (two) times daily.     losartan (COZAAR) 25 MG tablet Take 25 mg by mouth daily.     memantine (NAMENDA) 10 MG tablet Take 10 mg by mouth 2 (two) times daily.     metFORMIN (GLUCOPHAGE) 1000 MG tablet Take 1,000 mg by mouth 2 (two) times daily with a meal.     methocarbamol (ROBAXIN) 500 MG tablet  Take 1 tablet (500 mg total) by mouth 4 (four) times daily. 120 tablet 0   mirtazapine (REMERON) 30 MG tablet Take 30 mg by mouth at bedtime.     Omega-3 Fatty Acids (FISH OIL) 1000 MG CAPS Take 1,000 mg by mouth daily.     rosuvastatin (CRESTOR) 40 MG tablet Take 40 mg by mouth daily.      senna (SENOKOT) 8.6 MG TABS tablet Take 1 tablet by mouth daily.      tamsulosin (FLOMAX) 0.4 MG CAPS capsule Take 0.4 mg by mouth daily.     venlafaxine (EFFEXOR) 37.5 MG tablet Take 37.5 mg by mouth 2 (two) times daily. Take with 75 mg in am     venlafaxine XR (EFFEXOR-XR) 75 MG 24 hr capsule Take 1 capsule (75 mg total) by mouth daily with breakfast. 30 capsule 0   ziprasidone (GEODON) 40 MG capsule Take 40 mg by mouth daily.     abacavir-dolutegravir-lamiVUDine (TRIUMEQ) 600-50-300 MG tablet Take 1 tablet by mouth at bedtime. 30 tablet 6   acetaminophen (TYLENOL) 650 MG CR tablet Take 650 mg by mouth every 8 (eight) hours as needed for pain.     benztropine (COGENTIN) 0.5 MG tablet Take 1.5 mg by mouth daily. Pt unsure of dose (Patient not taking: Reported on 04/14/2022)     bismuth subsalicylate (PEPTO BISMOL) 262 MG/15ML suspension Take 15 mLs by mouth every 6 (six) hours as needed for indigestion. 1 tbsp     divalproex (DEPAKOTE ER) 500 MG 24 hr tablet Take 500 mg by mouth at bedtime.      docusate sodium (COLACE) 250 MG capsule Take 250 mg by mouth 2 (two) times daily.     FANAPT 12 MG TABS Take 12 mg by mouth 2 (two) times  daily.  (Patient not taking: Reported on 04/14/2022)     guaiFENesin (MUCINEX) 600 MG 12 hr tablet Take 600 mg by mouth 2 (two) times daily.     HUMALOG KWIKPEN 100 UNIT/ML KwikPen Inject 6 Units into the skin 3 (three) times daily. Only takes Humalog if blood sugar > 200. Takes 2 units if > 200. Additional instructions per sliding scale (Patient not taking: Reported on 04/14/2022)     Insulin Degludec (TRESIBA FLEXTOUCH) 200 UNIT/ML SOPN Inject 10 Units into the skin daily. (Patient  not taking: Reported on 04/14/2022)     mometasone (ELOCON) 0.1 % ointment Apply 1 application topically See admin instructions. Apply 4 drops to ear canal at bedtime every other night for itching and dry skin.     nitroGLYCERIN (NITROSTAT) 0.4 MG SL tablet Place 0.4 mg under the tongue every 5 (five) minutes as needed for chest pain.      omeprazole (PRILOSEC) 40 MG capsule Take 40 mg by mouth 2 (two) times daily.      OZEMPIC, 1 MG/DOSE, 4 MG/3ML SOPN Inject 2 mg into the skin every Monday. (Patient not taking: Reported on 04/14/2022)     sucralfate (CARAFATE) 1 g tablet Take 1 g by mouth 4 (four) times daily.  (Patient not taking: Reported on 04/14/2022)      Musculoskeletal: Strength & Muscle Tone: decreased Gait & Station:  uses walker  with steady gait Patient leans: Front  Psychiatric Specialty Exam:  Presentation  General Appearance: Casual  Eye Contact:Good  Speech:Clear and Coherent  Speech Volume:Normal  Handedness:No data recorded  Mood and Affect  Mood:Depressed  Affect:Congruent   Thought Process  Thought Processes:Coherent  Descriptions of Associations:Intact  Orientation:Full (Time, Place and Person)  Thought Content:WDL  History of Schizophrenia/Schizoaffective disorder:No data recorded Duration of Psychotic Symptoms:No data recorded Hallucinations:Hallucinations: None  Ideas of Reference:None  Suicidal Thoughts:Suicidal Thoughts: Yes, Active SI Active Intent and/or Plan: Without Intent; With Plan; With Means to Carry Out ("overdose on valium")  Homicidal Thoughts:Homicidal Thoughts: No   Sensorium  Memory:Immediate Fair  Judgment:Poor  Insight:Fair   Executive Functions  Concentration:Fair  Attention Span:Fair  [place]   Psychomotor Activity  Psychomotor Activity:Psychomotor Activity: Normal (slow, uses walker)   Assets  Assets:Desire for Improvement; Housing;  Resilience   Sleep  Sleep:Sleep: Poor   Physical Exam: Physical Exam Vitals and nursing note reviewed.  HENT:     Head: Normocephalic.     Nose: No congestion or rhinorrhea.  Eyes:     General:        Right eye: No discharge.        Left eye: No discharge.  Cardiovascular:     Rate and Rhythm: Normal rate.  Pulmonary:     Effort: Pulmonary effort is normal.  Musculoskeletal:        General: Normal range of motion.     Cervical back: Normal range of motion.  Skin:    General: Skin is dry.  Neurological:     Mental Status: He is alert and oriented to person, place, and time.  Psychiatric:        Attention and Perception: Attention normal.        Mood and Affect: Mood is anxious and depressed.        Speech: Speech normal.        Behavior: Behavior normal. Behavior is cooperative.        Thought Content: Thought content is paranoid. Thought content is not  delusional. Thought content includes suicidal ideation. Thought content does not include homicidal ideation.        Cognition and Memory: Cognition normal.        Judgment: Judgment normal.    Review of Systems  Respiratory: Negative.    Musculoskeletal:        Uses Walker  Skin: Negative.   Psychiatric/Behavioral:  Positive for depression and suicidal ideas. Negative for hallucinations, memory loss and substance abuse. The patient is nervous/anxious and has insomnia.        Schizoaffective disorder, bipolar type   Blood pressure (!) 154/86, pulse 74, temperature 97.9 F (36.6 C), temperature source Oral, resp. rate 18, SpO2 97 %. There is no height or weight on file to calculate BMI.  Treatment Plan Summary: Daily contact with patient to assess and evaluate symptoms and progress in treatment and Medication management. Reviewed with EDP  Disposition: Recommend psychiatric Inpatient admission when medically cleared.  Sherlon Handing, NP 04/14/2022 6:12 PM

## 2022-04-14 NOTE — ED Notes (Addendum)
Pt presents to ED with c/o SI. Pt states he started having SI this morning and states he was going to take some pills to end his life but instead called to come to ER. Pt states "I did not want to be alone in that apartment." Pt tearful at this time. Pt alert, calm, and cooperative at this time.

## 2022-04-14 NOTE — BH Assessment (Signed)
Comprehensive Clinical Assessment (CCA) Note  04/14/2022 Danny Cook [id number]  Armandina Gemma., 61 year old male who presents to [facility] ED voluntarily for treatment. Per triage note, Patient to ED via [facility] from home for suicidal thoughts. Patient states that SI thoughts started this AM and had thoughts of overdosing. Hx of anxiety and schizophrenia. Patient has not been taking his medications for the past 3 days.   During TTS assessment pt presents alert and oriented x 4, anxious but cooperative, and mood-congruent with affect. The pt does not appear to be responding to internal or external stimuli. Neither is the pt presenting with any delusional thinking. Pt verified the information provided to triage RN.   Pt identifies his main complaint to be that he ran out of his medication and for the past 3 days he has been endorsing suicidal ideations. Patient reports poor sleep and eating habits because he is so nervous and scared that he is going to harm himself, which is why he came to the ED. Patient states he lives alone but has a brother that checks on him periodically. Patient denies using any illicit substances and alcohol. Pt reports INPT hx years ago at [facility] for a similar situation; running out of meds. Pt reports family hx of MH from his mom who was diagnosed with depression and anxiety. Pt denies current HI/AH/VH. Pt does not feel safe returning home. Patient believes inpatient treatment would be and has been beneficial.    Per Barbaraann Share, NP pt is recommended for inpatient psychiatric admission.    Chief Complaint:  Chief Complaint  Patient presents with   Suicidal   Visit Diagnosis: Schizoaffective disorder, depressive type    CCA Screening, Triage and Referral (STR)  Patient Reported Information How did you hear about [place]? Self  Referral name: No data recorded Referral phone number: No data recorded  Whom do you see for routine medical problems? No data  recorded Practice/Facility Name: No data recorded Practice/Facility Phone Number: No data recorded Name of Contact: No data recorded Contact Number: No data recorded Contact Fax Number: No data recorded Prescriber Name: No data recorded Prescriber Address (if known): No data recorded  What Is the Reason for Your Visit/Call Today? Patient endorsing SI since being out of medications for the past 3 days.  How Long Has This Been Causing You Problems? <Week  What Do You Feel Would Help You the Most Today? Treatment for Depression or other mood problem; Medication(s)   Have You Recently Been in Any Inpatient Treatment (Hospital/Detox/Crisis Center/28-Day Program)? No data recorded Name/Location of Program/Hospital:No data recorded How Long Were You There? No data recorded When Were You Discharged? No data recorded  Have You Ever Received Services From [facility] Before? No data recorded Who Do You See at [facility]? No data recorded  Have You Recently Had Any Thoughts About Hurting Yourself? Yes  Are You Planning to Commit Suicide/Harm Yourself At This time? No   Have you Recently Had Thoughts About [place]? No  Explanation: No data recorded  Have You Used Any Alcohol or Drugs in the Past 24 Hours? No  How Long Ago Did You Use Drugs or Alcohol? No data recorded What Did You Use and How Much? No data recorded  Do You Currently Have a Therapist/Psychiatrist? No  Name of Therapist/Psychiatrist: No data recorded  Have You Been Recently Discharged From Any Office Practice or Programs? No  Explanation of Discharge From Practice/Program: No data recorded  CCA Screening Triage Referral Assessment Type of Contact: Face-to-Face  Is this Initial or Reassessment? No data recorded Date Telepsych consult ordered in [facility]:  No data recorded Time Telepsych consult ordered in [facility]:  No data recorded  Patient Reported Information Reviewed? No data recorded Patient Left  Without Being Seen? No data recorded Reason for Not Completing Assessment: No data recorded  Collateral Involvement: Brother- [place]   Does Patient Have a [place]? No data recorded Name and Contact of Legal Guardian: No data recorded If Minor and Not Living with Parent(s), Who has Custody? n/a  Is CPS involved or ever been involved? Never  Is APS involved or ever been involved? Never   Patient Determined To Be At Risk for Harm To Self or Others Based on Review of Patient Reported Information or Presenting Complaint? No data recorded Method: No data recorded Availability of Means: No data recorded Intent: No data recorded Notification Required: No data recorded Additional Information for Danger to Others Potential: No data recorded Additional Comments for Danger to Others Potential: No data recorded Are There Guns or Other Weapons in Your Home? No data recorded Types of Guns/Weapons: No data recorded Are These Weapons Safely Secured?                            No data recorded Who Could Verify You Are Able To Have These Secured: No data recorded Do You Have any Outstanding Charges, Pending Court Dates, Parole/Probation? No data recorded Contacted To Inform of Risk of Harm To Self or Others: No data recorded  Location of Assessment: [facility] ED   Does Patient Present under Involuntary Commitment? No  IVC Papers Initial File Date: No data recorded  [place] of Residence: [place]   Patient Currently Receiving the Following Services: Medication Management   Determination of Need: Emergent (2 hours)   Options For Referral: ED Visit; Medication Management; Inpatient Hospitalization     CCA Biopsychosocial Intake/Chief Complaint:  No data recorded Current Symptoms/Problems: No data recorded  Patient Reported Schizophrenia/Schizoaffective Diagnosis in Past: Yes   Strengths: Patient able to verbalize and communicate needs.  Preferences:  No data recorded Abilities: No data recorded  Type of Services Patient Feels are Needed: No data recorded  Initial Clinical Notes/Concerns: No data recorded  Mental Health Symptoms Depression:   Fatigue; Change in energy/activity; Difficulty Concentrating; Hopelessness; Increase/decrease in appetite; Sleep (too much or little)   Duration of Depressive symptoms:  Less than two weeks   Mania:   N/A   Anxiety:    Difficulty concentrating; Restlessness; Worrying   Psychosis:   None   Duration of Psychotic symptoms: No data recorded  Trauma:   N/A   Obsessions:   N/A   Compulsions:   N/A   Inattention:   N/A   Hyperactivity/Impulsivity:   N/A   Oppositional/Defiant Behaviors:   N/A   Emotional Irregularity:   N/A   Other Mood/Personality Symptoms:  No data recorded   Mental Status Exam Appearance and self-care  Stature:   Average   Weight:   Average weight   Clothing:   Casual   Grooming:   Normal   Cosmetic use:   None   Posture/gait:   Slumped   Motor activity:   Slowed   Sensorium  Attention:   Normal   Concentration:   Anxiety interferes   Orientation:   X5   Recall/memory:   Normal   Affect and  Mood  Affect:   Anxious; Depressed; Flat   Mood:   Anxious; Depressed   Relating  Eye contact:   Normal   Facial expression:   Anxious; Sad; Fearful; Depressed   Attitude toward examiner:   Cooperative   Thought and Language  Speech flow:  Clear and Coherent   Thought content:   Appropriate to Mood and Circumstances   Preoccupation:   None   Hallucinations:   None   Organization:  No data recorded  [employer] of Knowledge:   Average   Intelligence:   Average   Abstraction:   Functional   Judgement:   Good   Reality Testing:   Realistic   Insight:   Good   Decision Making:   Normal   Social Functioning  Social Maturity:   Isolates   Social Judgement:   Normal   Stress   Stressors:   Illness   Coping Ability:   Programme researcher, broadcasting/film/video Deficits:   None   Supports:   Family     Religion:    Leisure/Recreation:    Exercise/Diet: Exercise/Diet Do You Have Any Trouble Sleeping?: Yes Explanation of Sleeping Difficulties: Patient reports he has not been able to sleep due to worrying and stress of not having medication.   CCA Employment/Education Employment/Work Situation: Employment / Work Technical sales engineer: On disability Why is Patient on Disability: Medical How Long has Patient Been on Disability: Since 1995  Education:     CCA Family/Childhood History Family and Relationship History:    Childhood History:     Child/Adolescent Assessment:     CCA Substance Use Alcohol/Drug Use: Alcohol / Drug Use Pain Medications: SEE PTA  Prescriptions: SEE PTA  History of alcohol / drug use?: No history of alcohol / drug abuse                         ASAM's:  Six Dimensions of Multidimensional Assessment  Dimension 1:  Acute Intoxication and/or Withdrawal Potential:      Dimension 2:  Biomedical Conditions and Complications:      Dimension 3:  Emotional, Behavioral, or Cognitive Conditions and Complications:     Dimension 4:  Readiness to Change:     Dimension 5:  Relapse, Continued use, or Continued Problem Potential:     Dimension 6:  Recovery/Living Environment:     ASAM Severity Score:    ASAM Recommended Level of Treatment:     Substance use Disorder (SUD)    Recommendations for Services/Supports/Treatments:    DSM5 Diagnoses: Patient Active Problem List   Diagnosis Date Noted   Suicidal ideation 04/14/2022   Abdominal pain 07/30/2020   Choledocholithiasis 07/30/2020   Hypoglycemia 07/30/2020   Dehydration 07/30/2020   Hyperlipidemia 07/30/2020   Acute renal failure superimposed on stage 3a chronic kidney disease ([place]) 07/30/2020   Acute cholecystitis 07/29/2020   Sepsis ([place]) 08/15/2019    NSTEMI (non-ST elevated myocardial infarction) ([place]) 06/07/2018   Lymphedema 05/12/2018   Pleural effusion    FUO (fever of unknown origin)    MGUS (monoclonal gammopathy of unknown significance)    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   Healthcare associated bacterial pneumonia 04/25/2018   Community acquired pneumonia 04/12/2018   Pneumonia 04/12/2018   AKI (acute kidney injury) ([place]) 03/23/2018   Pericarditis with effusion 03/23/2018   Schizoaffective disorder, depressive type ([place]) 11/04/2017   HIV positive ([place]) 11/04/2017   Orthostatic dizziness 07/31/2017   Chronic diastolic heart  failure ([place]) 07/29/2017   HTN (hypertension) 07/29/2017   Diabetes ([place]) 07/29/2017   Schizophrenia ([place]) 07/29/2017    Patient Centered Plan: Patient is on the following Treatment Plan(s):  Anxiety and Depression   Referrals to Alternative Service(s): Referred to Alternative Service(s):   Place:   Date:   Time:    Referred to Alternative Service(s):   Place:   Date:   Time:    Referred to Alternative Service(s):   Place:   Date:   Time:    Referred to Alternative Service(s):   Place:   Date:   Time:      '@BHCOLLABOFCARE'$ @  [place], Counselor, LCAS-A

## 2022-04-14 NOTE — ED Notes (Signed)
Pt back from CT

## 2022-04-14 NOTE — ED Notes (Signed)
Pt states he feels nauseous. Provider made aware.

## 2022-04-14 NOTE — BH Assessment (Signed)
Patient is to be admitted to [facility] by Psychiatric Nurse Practitioner Caroline Sauger.  Attending Physician will be Dr.  Louis Meckel .   Patient has been assigned to room L30, by [place].   Intake Paper Work has been signed and placed on patient chart.  ER staff is aware of the admission: [facility] ER Secretary   Dr. Beather Arbour, ER MD  Joelene Millin Patient's Nurse  Levada Dy Patient Access.

## 2022-04-14 NOTE — ED Notes (Signed)
VOL/  PENDING  CONSULT 

## 2022-04-15 ENCOUNTER — Inpatient Hospital Stay
Admission: AD | Admit: 2022-04-15 | Discharge: 2022-04-23 | DRG: 885 | Disposition: A | Discharge status: Home / Self Care | Payer: Medicare Other | Source: Intra-hospital | Attending: Psychiatry | Admitting: Psychiatry

## 2022-04-15 DIAGNOSIS — E1122 Type 2 diabetes mellitus with diabetic chronic kidney disease: Secondary | ICD-10-CM | POA: Diagnosis present

## 2022-04-15 DIAGNOSIS — I130 Hypertensive heart and chronic kidney disease with heart failure and stage 1 through stage 4 chronic kidney disease, or unspecified chronic kidney disease: Secondary | ICD-10-CM | POA: Diagnosis present

## 2022-04-15 DIAGNOSIS — B20 Human immunodeficiency virus [HIV] disease: Secondary | ICD-10-CM | POA: Diagnosis present

## 2022-04-15 DIAGNOSIS — G4700 Insomnia, unspecified: Secondary | ICD-10-CM | POA: Diagnosis present

## 2022-04-15 DIAGNOSIS — Z79899 Other long term (current) drug therapy: Secondary | ICD-10-CM | POA: Diagnosis not present

## 2022-04-15 DIAGNOSIS — I10 Essential (primary) hypertension: Secondary | ICD-10-CM | POA: Diagnosis present

## 2022-04-15 DIAGNOSIS — E7800 Pure hypercholesterolemia, unspecified: Secondary | ICD-10-CM | POA: Diagnosis present

## 2022-04-15 DIAGNOSIS — F331 Major depressive disorder, recurrent, moderate: Secondary | ICD-10-CM | POA: Insufficient documentation

## 2022-04-15 DIAGNOSIS — R112 Nausea with vomiting, unspecified: Secondary | ICD-10-CM | POA: Diagnosis present

## 2022-04-15 DIAGNOSIS — F419 Anxiety disorder, unspecified: Secondary | ICD-10-CM | POA: Diagnosis present

## 2022-04-15 DIAGNOSIS — R45851 Suicidal ideations: Secondary | ICD-10-CM | POA: Diagnosis present

## 2022-04-15 DIAGNOSIS — Z20822 Contact with and (suspected) exposure to covid-19: Secondary | ICD-10-CM | POA: Diagnosis present

## 2022-04-15 DIAGNOSIS — E119 Type 2 diabetes mellitus without complications: Secondary | ICD-10-CM

## 2022-04-15 DIAGNOSIS — F333 Major depressive disorder, recurrent, severe with psychotic symptoms: Principal | ICD-10-CM

## 2022-04-15 DIAGNOSIS — I5032 Chronic diastolic (congestive) heart failure: Secondary | ICD-10-CM | POA: Diagnosis present

## 2022-04-15 DIAGNOSIS — Z91148 Patient's other noncompliance with medication regimen for other reason: Secondary | ICD-10-CM

## 2022-04-15 DIAGNOSIS — Z7982 Long term (current) use of aspirin: Secondary | ICD-10-CM | POA: Diagnosis not present

## 2022-04-15 DIAGNOSIS — Z21 Asymptomatic human immunodeficiency virus [HIV] infection status: Secondary | ICD-10-CM | POA: Diagnosis present

## 2022-04-15 DIAGNOSIS — Z7984 Long term (current) use of oral hypoglycemic drugs: Secondary | ICD-10-CM | POA: Diagnosis not present

## 2022-04-15 LAB — LIPID PANEL
Cholesterol: 154 mg/dL (ref 0–200)
HDL: 27 mg/dL — ABNORMAL LOW (ref >40)
LDL Cholesterol: 75 mg/dL (ref 0–99)
Total CHOL/HDL Ratio: 5.7 RATIO
Triglycerides: 259 mg/dL — ABNORMAL HIGH (ref <150)
VLDL: 52 mg/dL — ABNORMAL HIGH (ref 0–40)

## 2022-04-15 LAB — GLUCOSE, CAPILLARY: Glucose-Capillary: 120 mg/dL — ABNORMAL HIGH (ref 70–99)

## 2022-04-15 LAB — HEMOGLOBIN A1C
Hgb A1c MFr Bld: 5.8 % — ABNORMAL HIGH (ref 4.8–5.6)
Mean Plasma Glucose: 119.76 mg/dL

## 2022-04-15 MED ORDER — BENZTROPINE MESYLATE 1 MG PO TABS
1.5000 mg | ORAL_TABLET | Freq: Every day | ORAL | Status: DC
  Administered 2022-04-15: 1.5 mg via ORAL
  Filled 2022-04-15: qty 2

## 2022-04-15 MED ORDER — LOSARTAN POTASSIUM 25 MG PO TABS
25.0000 mg | ORAL_TABLET | Freq: Every day | ORAL | Status: DC
  Administered 2022-04-15 – 2022-04-23 (×9): 25 mg via ORAL
  Filled 2022-04-15 (×9): qty 1

## 2022-04-15 MED ORDER — DIAZEPAM 5 MG PO TABS
5.0000 mg | ORAL_TABLET | Freq: Three times a day (TID) | ORAL | Status: DC
  Administered 2022-04-15 – 2022-04-23 (×23): 5 mg via ORAL
  Filled 2022-04-15 (×24): qty 1

## 2022-04-15 MED ORDER — MIRTAZAPINE 15 MG PO TABS
45.0000 mg | ORAL_TABLET | Freq: Every day | ORAL | Status: DC
  Administered 2022-04-15 – 2022-04-22 (×8): 45 mg via ORAL
  Filled 2022-04-15 (×8): qty 3

## 2022-04-15 MED ORDER — ABACAVIR-DOLUTEGRAVIR-LAMIVUD 600-50-300 MG PO TABS
1.0000 | ORAL_TABLET | Freq: Every day | ORAL | Status: DC
  Administered 2022-04-15 – 2022-04-22 (×8): 1 via ORAL
  Filled 2022-04-15 (×8): qty 1

## 2022-04-15 MED ORDER — ZIPRASIDONE HCL 40 MG PO CAPS
40.0000 mg | ORAL_CAPSULE | Freq: Every day | ORAL | Status: DC
  Administered 2022-04-15: 40 mg via ORAL
  Filled 2022-04-15: qty 1

## 2022-04-15 MED ORDER — MIRTAZAPINE 15 MG PO TABS: 30.0000 mg | ORAL_TABLET | Freq: Every day | ORAL | Status: DC

## 2022-04-15 MED ORDER — DONEPEZIL HCL 5 MG PO TABS
5.0000 mg | ORAL_TABLET | Freq: Every day | ORAL | Status: DC
  Administered 2022-04-15 – 2022-04-23 (×9): 5 mg via ORAL
  Filled 2022-04-15 (×9): qty 1

## 2022-04-15 MED ORDER — VENLAFAXINE HCL 37.5 MG PO TABS
37.5000 mg | ORAL_TABLET | Freq: Two times a day (BID) | ORAL | Status: DC
  Administered 2022-04-15: 37.5 mg via ORAL
  Filled 2022-04-15: qty 1

## 2022-04-15 MED ORDER — DIVALPROEX SODIUM ER 500 MG PO TB24
500.0000 mg | ORAL_TABLET | Freq: Every day | ORAL | Status: DC
  Administered 2022-04-15 – 2022-04-22 (×8): 500 mg via ORAL
  Filled 2022-04-15 (×8): qty 1

## 2022-04-15 MED ORDER — VENLAFAXINE HCL ER 75 MG PO CP24
150.0000 mg | ORAL_CAPSULE | Freq: Every day | ORAL | Status: DC
  Administered 2022-04-16 – 2022-04-23 (×8): 150 mg via ORAL
  Filled 2022-04-15 (×8): qty 2

## 2022-04-15 MED ORDER — MEMANTINE HCL 10 MG PO TABS
10.0000 mg | ORAL_TABLET | Freq: Two times a day (BID) | ORAL | Status: DC
  Administered 2022-04-15 – 2022-04-23 (×17): 10 mg via ORAL
  Filled 2022-04-15 (×18): qty 1

## 2022-04-15 MED ORDER — VENLAFAXINE HCL ER 75 MG PO CP24
75.0000 mg | ORAL_CAPSULE | Freq: Every day | ORAL | Status: DC
  Administered 2022-04-15: 75 mg via ORAL
  Filled 2022-04-15: qty 1

## 2022-04-15 MED ORDER — MAGNESIUM HYDROXIDE 400 MG/5ML PO SUSP
30.0000 mL | Freq: Every day | ORAL | Status: DC | PRN
  Administered 2022-04-16: 30 mL via ORAL
  Filled 2022-04-15: qty 30

## 2022-04-15 MED ORDER — DIAZEPAM 5 MG PO TABS
5.0000 mg | ORAL_TABLET | Freq: Two times a day (BID) | ORAL | Status: DC
  Administered 2022-04-15: 5 mg via ORAL
  Filled 2022-04-15: qty 1

## 2022-04-15 MED ORDER — DICYCLOMINE HCL 10 MG PO CAPS
10.0000 mg | ORAL_CAPSULE | Freq: Three times a day (TID) | ORAL | Status: DC
  Administered 2022-04-15 – 2022-04-23 (×33): 10 mg via ORAL
  Filled 2022-04-15 (×34): qty 1

## 2022-04-15 MED ORDER — METFORMIN HCL 500 MG PO TABS
1000.0000 mg | ORAL_TABLET | Freq: Two times a day (BID) | ORAL | Status: DC
  Administered 2022-04-16 – 2022-04-23 (×15): 1000 mg via ORAL
  Filled 2022-04-15 (×15): qty 2

## 2022-04-15 MED ORDER — ZIPRASIDONE HCL 40 MG PO CAPS
40.0000 mg | ORAL_CAPSULE | Freq: Two times a day (BID) | ORAL | Status: DC
  Administered 2022-04-15 – 2022-04-23 (×16): 40 mg via ORAL
  Filled 2022-04-15 (×17): qty 1

## 2022-04-15 MED ORDER — ACETAMINOPHEN 325 MG PO TABS: 650.0000 mg | ORAL_TABLET | Freq: Four times a day (QID) | ORAL | Status: DC | PRN

## 2022-04-15 MED ORDER — ALBUTEROL SULFATE HFA 108 (90 BASE) MCG/ACT IN AERS: 2.0000 | INHALATION_SPRAY | Freq: Four times a day (QID) | RESPIRATORY_TRACT | Status: DC | PRN

## 2022-04-15 MED ORDER — ALUM & MAG HYDROXIDE-SIMETH 200-200-20 MG/5ML PO SUSP: 30.0000 mL | ORAL | Status: DC | PRN

## 2022-04-15 MED ORDER — FLUTICASONE PROPIONATE HFA 220 MCG/ACT IN AERO
2.0000 | INHALATION_SPRAY | Freq: Two times a day (BID) | RESPIRATORY_TRACT | Status: DC
  Administered 2022-04-15 – 2022-04-23 (×17): 2 via RESPIRATORY_TRACT
  Filled 2022-04-15: qty 12

## 2022-04-15 MED ORDER — GABAPENTIN 400 MG PO CAPS
800.0000 mg | ORAL_CAPSULE | Freq: Three times a day (TID) | ORAL | Status: DC
  Administered 2022-04-15 – 2022-04-23 (×25): 800 mg via ORAL
  Filled 2022-04-15 (×26): qty 2

## 2022-04-15 MED ORDER — ASPIRIN 325 MG PO TABS
325.0000 mg | ORAL_TABLET | Freq: Every day | ORAL | Status: DC
  Administered 2022-04-15 – 2022-04-23 (×9): 325 mg via ORAL
  Filled 2022-04-15 (×9): qty 1

## 2022-04-15 NOTE — H&P (Signed)
Psychiatric Admission Assessment Adult  Patient Identification: Danny Cook. MRN:  [id number] Date of Evaluation:  04/15/2022 Chief Complaint:  MDD (major depressive disorder), recurrent episode, moderate ([place]) [F33.1] Principal Diagnosis: Severe recurrent major depression with psychotic features ([place]) Diagnosis:  Principal Problem:   Severe recurrent major depression with psychotic features ([place]) Active Problems:   Chronic diastolic heart failure (HCC)   HTN (hypertension)   Diabetes ([place])   HIV positive ([place])  History of Present Illness: Patient seen and chart reviewed.  This is a 61 year old patient with a long history of depression who presented to the hospital with complaints of depression and suicidal ideation.  Patient reports that his mood has been very sad and depressed for months and getting worse.  Feels tired all the time.  He specifically complains that because of his depression he has been unable to care for himself.  He has been unable to do anything like to take care of his house feed himself.  He has not been taking his medication for several days.  Patient is currently living in his own apartment independently after having previously lived in a family care home for quite a while.  He says he regrets it now and wishes he could go back to a family care home.  Patient is denying any hallucinations.  Denies homicidal ideation.  Denies intent to act on it in the hospital.  Denies any alcohol or drug abuse.  He is only vaguely aware of what his current medications are.  Denies any new medical issues.  Patient has been cooperative since coming into the hospital.  He states he has a major problem with loneliness.  Only close relative is his brother.  Has little activity outside of his home. Associated Signs/Symptoms: Depression Symptoms:  depressed mood, anhedonia, insomnia, psychomotor retardation, fatigue, feelings of worthlessness/guilt, difficulty  concentrating, hopelessness, suicidal thoughts with specific plan, Duration of Depression Symptoms: Less than two weeks  (Hypo) Manic Symptoms:   None reported Anxiety Symptoms:  Excessive Worry, Psychotic Symptoms:   Currently denies any PTSD Symptoms: Patient has long stated history of having been involved in a severe trauma in his 76s previously attributed his illness to it although he is not currently referring to that Total Time spent with patient: 30 minutes  Past Psychiatric History: Past history of various diagnoses including schizoaffective disorder and schizophrenia but as noted previously most of his symptoms seem to be major depression sometimes with some associated psychosis but severe impairment from depression and anxiety.  Patient does have a past history of overdoses and suicide attempts.  He seems overall to have done better in supervised environments.  Multiple medical problems including being HIV positive having diabetes and heart failure.  Is the patient at risk to self? Yes.    Has the patient been a risk to self in the past 6 months? Yes.    Has the patient been a risk to self within the distant past? Yes.    Is the patient a risk to others? No.  Has the patient been a risk to others in the past 6 months? No.  Has the patient been a risk to others within the distant past? No.   [place] Scale:  [place] Admission (Current) from 04/15/2022 in [facility] ED from 04/14/2022 in [facility] ED from 04/06/2022 in [facility] CATEGORY High Risk Moderate Risk No Risk  Prior Inpatient Therapy:   Prior Outpatient Therapy:    Alcohol Screening:   Substance Abuse History in the last 12 months:  No. 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[place])    Myocardial infarction ([place])    Paranoid disorder ([place])    Pneumonia    Schizophrenia, paranoid ([place])    Sleep apnea    Stroke ([place])    mini TIA   TIA (transient ischemic attack)     Past Surgical History:  Procedure Laterality Date   CARDIAC CATHETERIZATION     CATARACT EXTRACTION W/PHACO Right 04/25/2021   Procedure: CATARACT EXTRACTION PHACO AND INTRAOCULAR LENS PLACEMENT ([place]) RIGHT DIABETIC;  Surgeon: Birder Robson, MD;  Location: [facility] ORS;  Service: Ophthalmology;  Laterality: Right;  3.32 0:23.0   CATARACT EXTRACTION W/PHACO Left 09/19/2021   Procedure: CATARACT EXTRACTION PHACO AND INTRAOCULAR LENS PLACEMENT ([place]) LEFT DIABETIC;  Surgeon: Birder Robson, MD;  Location: [facility] ORS;  Service: Ophthalmology;  Laterality: Left;  6.73 0:49.1   COLONOSCOPY WITH PROPOFOL N/A 09/03/2015   Procedure: COLONOSCOPY WITH PROPOFOL;  Surgeon: Lollie Sails, MD;  Location: [facility] ENDOSCOPY;  Service: Endoscopy;  Laterality: N/A;   COLONOSCOPY WITH PROPOFOL N/A 09/17/2020   Procedure: COLONOSCOPY WITH PROPOFOL;  Surgeon: Lesly Rubenstein, MD;  Location: [facility] ENDOSCOPY;  Service: Endoscopy;  Laterality: N/A;  PREFERS 9AM OR LATER DUE TO TRANSPORTATION   DG TEETH FULL     ESOPHAGOGASTRODUODENOSCOPY (EGD) WITH PROPOFOL N/A 07/01/2016   Dr. [name], [facility]. abnormal esophageal motility, suspicious for presbyesophagus, bile gastritis s/p biopsy, non-bleeding erosive gastropathy s/p biopsy, normal duodenum, path with negative H.pylori and  +chronic active gastritis   ESOPHAGOGASTRODUODENOSCOPY (EGD) WITH PROPOFOL N/A 09/17/2020   Procedure: ESOPHAGOGASTRODUODENOSCOPY (EGD) WITH PROPOFOL;  Surgeon: Lesly Rubenstein, MD;  Location: [facility] ENDOSCOPY;  Service: Endoscopy;  Laterality: N/A;   EXTRACORPOREAL SHOCK WAVE LITHOTRIPSY Right 11/01/2020   Procedure: EXTRACORPOREAL SHOCK WAVE LITHOTRIPSY (ESWL);  Surgeon: Abbie Sons, MD;  Location: [facility] ORS;  Service: Urology;  Laterality: Right;   LUMBAR LAMINECTOMY/DECOMPRESSION MICRODISCECTOMY N/A 07/15/2021   Procedure: L3-5 DECOMPRESSION;  Surgeon: Meade Maw, MD;  Location: [facility] ORS;  Service: Neurosurgery;  Laterality: N/A;   Family History:  Family History  Problem Relation Age of Onset   Heart attack Mother    Diabetes Mellitus II Mother    Mental illness Mother    CAD Mother    Heart attack Father    CAD Father    Hypertension Father    Family Psychiatric  History: Patient says he is not aware of Tobacco Screening:   Social History:  Social History   Substance and Sexual Activity  Alcohol Use No     Social History   Substance and Sexual Activity  Drug Use No   Comment: denies street drugs    Additional Social History: Marital status: Single Does patient have children?: No  Allergies:   Allergies  Allergen Reactions   Trazodone And Nefazodone     Affects other meds that they dont work   Viread [Tenofovir Disoproxil]     Damages kidneys   Etodolac Rash   Lab Results:  Results for orders placed or performed during the hospital encounter of 04/15/22 (from the past 48 hour(s))  Glucose, capillary     Status: Abnormal   Collection Time: 04/15/22  7:41 AM  Result Value Ref Range   Glucose-Capillary 120 (H) 70 - 99 mg/dL    Comment: Glucose reference range applies only to samples taken after fasting for at least 8 hours.    Blood Alcohol level:  Lab Results  Component Value Date   ETH <10 04/14/2022   ETH <10  67/54/4920    Metabolic Disorder Labs:  Lab Results  Component Value Date   HGBA1C 6.3 (H) 07/31/2020   MPG 134.11 07/31/2020   MPG 114.02 08/14/2019   No results found for: "PROLACTIN" Lab Results  Component Value Date   CHOL 215 (H) 11/05/2017   TRIG 387 (H) 11/05/2017   HDL 32 (L) 11/05/2017   CHOLHDL 6.7 11/05/2017   VLDL 77 (H) 11/05/2017   LDLCALC 106 (H) 11/05/2017    Current Medications: Current Facility-Administered Medications  Medication Dose Route Frequency Provider Last Rate Last Admin   abacavir-dolutegravir-lamiVUDine (TRIUMEQ) 100-71-219 MG per tablet 1 tablet  1 tablet Oral QHS Caroline Sauger, NP       acetaminophen (TYLENOL) tablet 650 mg  650 mg Oral Q6H PRN Caroline Sauger, NP       albuterol (VENTOLIN HFA) 108 (90 Base) MCG/ACT inhaler 2 puff  2 puff Inhalation Q6H PRN Caroline Sauger, NP       alum & mag hydroxide-simeth (MAALOX/MYLANTA) 200-200-20 MG/5ML suspension 30 mL  30 mL Oral Q4H PRN Caroline Sauger, NP       aspirin tablet 325 mg  325 mg Oral Daily Caroline Sauger, NP   325 mg at 04/15/22 7588   benztropine (COGENTIN) tablet 1.5 mg  1.5 mg Oral Daily Caroline Sauger, NP   1.5 mg at 04/15/22 0913   diazepam (VALIUM) tablet 5 mg  5 mg Oral BID Caroline Sauger, NP   5 mg at 04/15/22 0913   dicyclomine (BENTYL) capsule 10 mg  10 mg Oral TID AC & HS Caroline Sauger, NP   10 mg at 04/15/22 1131   divalproex (DEPAKOTE ER) 24 hr tablet 500 mg  500 mg Oral QHS Caroline Sauger, NP       donepezil (ARICEPT) tablet 5 mg  5 mg Oral Daily Caroline Sauger, NP   5 mg at 04/15/22 0913   fluticasone (FLOVENT HFA) 220 MCG/ACT inhaler 2 puff  2 puff Inhalation BID Caroline Sauger, NP   2 puff at 04/15/22 0917   gabapentin (NEURONTIN) capsule 800 mg  800 mg Oral TID Caroline Sauger, NP   800 mg at 04/15/22 0918   losartan (COZAAR) tablet 25 mg  25 mg Oral Daily Caroline Sauger, NP   25 mg at 04/15/22 0916    magnesium hydroxide (MILK OF MAGNESIA) suspension 30 mL  30 mL Oral Daily PRN Caroline Sauger, NP       memantine [facility]) tablet 10 mg  10 mg Oral BID Caroline Sauger, NP   10 mg at 04/15/22 0916   [START ON 04/16/2022] metFORMIN (GLUCOPHAGE) tablet 1,000 mg  1,000 mg Oral BID WC Caroline Sauger, NP       mirtazapine (REMERON) tablet 30 mg  30 mg Oral QHS Caroline Sauger, NP       venlafaxine [facility]) tablet 37.5 mg  37.5 mg Oral BID Caroline Sauger, NP   37.5 mg at 04/15/22 0915   venlafaxine XR (EFFEXOR-XR) 24 hr capsule 75 mg  75 mg Oral Q breakfast Caroline Sauger, NP   75 mg at 04/15/22 1448   ziprasidone (GEODON) capsule 40 mg  40 mg Oral Q breakfast Caroline Sauger, NP   40 mg at 04/15/22 1856   PTA Medications: Medications Prior to Admission  Medication Sig Dispense Refill Last Dose   abacavir-dolutegravir-lamiVUDine (TRIUMEQ) 600-50-300 MG tablet Take 1 tablet by mouth at bedtime. 30 tablet 6    acetaminophen (TYLENOL) 650 MG CR tablet Take 650 mg by mouth every 8 (eight) hours as needed for pain.      albuterol (PROVENTIL HFA;VENTOLIN HFA) 108 (90 Base) MCG/ACT inhaler Inhale 2 puffs into the lungs every 6 (six) hours as needed for wheezing or shortness of breath. 1 Inhaler 2    aspirin 325 MG tablet Take 325 mg by mouth daily.      benztropine (COGENTIN) 0.5 MG tablet Take 1.5 mg by mouth daily. Pt unsure of dose (Patient not taking: Reported on 04/14/2022)      bismuth subsalicylate (PEPTO BISMOL) 262 MG/15ML suspension Take 15 mLs by mouth every 6 (six) hours as needed for indigestion. 1 tbsp      brimonidine-timolol (COMBIGAN) 0.2-0.5 % ophthalmic solution Place 1 drop into both eyes every 12 (twelve) hours.      diazepam (VALIUM) 5 MG tablet Take 2.5-5 mg by mouth See admin instructions. 5 mg in the morning, 2.5 mg midday, 5 mg in the evening      dicyclomine (BENTYL) 10 MG capsule Take 10 mg by mouth 4 (four) times daily -  before meals and  at bedtime.      divalproex (DEPAKOTE ER) 500 MG 24 hr tablet Take 500 mg by mouth at bedtime.       docusate sodium (COLACE) 250 MG capsule Take 250 mg by mouth 2 (two) times daily.      donepezil (ARICEPT) 10 MG tablet Take 5 mg by mouth daily.      FANAPT 12 MG TABS Take 12 mg by mouth 2 (two) times daily.  (Patient not taking: Reported on 04/14/2022)      Fluocinolone Acetonide 0.01 % OIL Place 2-4 drops in ear(s) every other day. At bedtime for dry skin      fluticasone (FLOVENT HFA) 220 MCG/ACT inhaler Inhale 2 puffs into the lungs 2 (two) times daily. Rinse out mouth afterwards 1 Inhaler 12    gabapentin (NEURONTIN) 600 MG tablet Take 800 mg by mouth 3 (three) times daily.      gemfibrozil (LOPID) 600 MG tablet Take 600 mg by mouth 2 (two) times daily before a meal.      glimepiride (AMARYL) 4 MG tablet Take 4 mg by mouth daily with breakfast.       glycopyrrolate (ROBINUL) 2 MG tablet Take 0.5 mg by mouth 2 (two) times daily.      guaiFENesin (MUCINEX) 600 MG 12 hr tablet Take 600 mg by mouth 2 (two) times daily.      HUMALOG KWIKPEN 100 UNIT/ML KwikPen Inject 6 Units into the skin 3 (three) times daily. Only takes Humalog if blood sugar > 200. Takes 2 units if > 200. Additional instructions per sliding scale (Patient not taking: Reported on 04/14/2022)      Insulin Degludec (TRESIBA  FLEXTOUCH) 200 UNIT/ML SOPN Inject 10 Units into the skin daily. (Patient not taking: Reported on 04/14/2022)      losartan (COZAAR) 25 MG tablet Take 25 mg by mouth daily.      memantine (NAMENDA) 10 MG tablet Take 10 mg by mouth 2 (two) times daily.      metFORMIN (GLUCOPHAGE) 1000 MG tablet Take 1,000 mg by mouth 2 (two) times daily with a meal.      methocarbamol (ROBAXIN) 500 MG tablet Take 1 tablet (500 mg total) by mouth 4 (four) times daily. 120 tablet 0    mirtazapine (REMERON) 30 MG tablet Take 30 mg by mouth at bedtime.      mometasone (ELOCON) 0.1 % ointment Apply 1 application topically See admin  instructions. Apply 4 drops to ear canal at bedtime every other night for itching and dry skin.      nitroGLYCERIN (NITROSTAT) 0.4 MG SL tablet Place 0.4 mg under the tongue every 5 (five) minutes as needed for chest pain.       Omega-3 Fatty Acids (FISH OIL) 1000 MG CAPS Take 1,000 mg by mouth daily.      omeprazole (PRILOSEC) 40 MG capsule Take 40 mg by mouth 2 (two) times daily.       OZEMPIC, 1 MG/DOSE, 4 MG/3ML SOPN Inject 2 mg into the skin every Monday. (Patient not taking: Reported on 04/14/2022)      rosuvastatin (CRESTOR) 40 MG tablet Take 40 mg by mouth daily.       senna (SENOKOT) 8.6 MG TABS tablet Take 1 tablet by mouth daily.       sucralfate (CARAFATE) 1 g tablet Take 1 g by mouth 4 (four) times daily.  (Patient not taking: Reported on 04/14/2022)      tamsulosin (FLOMAX) 0.4 MG CAPS capsule Take 0.4 mg by mouth daily.      venlafaxine (EFFEXOR) 37.5 MG tablet Take 37.5 mg by mouth 2 (two) times daily. Take with 75 mg in am      venlafaxine XR (EFFEXOR-XR) 75 MG 24 hr capsule Take 1 capsule (75 mg total) by mouth daily with breakfast. 30 capsule 0    ziprasidone (GEODON) 40 MG capsule Take 40 mg by mouth daily.       Musculoskeletal: Strength & Muscle Tone: within normal limits Gait & Station: unsteady Patient leans: Front            Psychiatric Specialty Exam:  Presentation  General Appearance:  Casual  Eye Contact: Good  Speech: Clear and Coherent  Speech Volume: Normal  Handedness:No data recorded  Mood and Affect  Mood: Depressed  Affect: Congruent   Thought Process  Thought Processes: Coherent  Duration of Psychotic Symptoms: No data recorded Past Diagnosis of Schizophrenia or Psychoactive disorder: Yes  Descriptions of Associations:Intact  Orientation:Full (Time, Place and Person)  Thought Content:WDL  Hallucinations:Hallucinations: None  Ideas of Reference:None  Suicidal Thoughts:Suicidal Thoughts: Yes, Active SI Active  Intent and/or Plan: Without Intent; With Plan; With Means to Carry Out ("overdose on valium")  Homicidal Thoughts:Homicidal Thoughts: No   Sensorium  Memory: Immediate Fair  Judgment: Poor  Insight: Fair   Materials engineer: Fair  Attention Span: Fair  Recall: [employer] of Knowledge: Fair  Language: Fair   Psychomotor Activity  Psychomotor Activity: Psychomotor Activity: Normal (slow, uses walker)   Assets  Assets: Desire for Improvement; Housing; Resilience   Sleep  Sleep: Sleep: Poor    Physical Exam: Physical Exam Vitals and nursing  note reviewed.  Constitutional:      Appearance: Normal appearance.  HENT:     Head: Normocephalic and atraumatic.     Mouth/Throat:     Pharynx: Oropharynx is clear.  Eyes:     Pupils: Pupils are equal, round, and reactive to light.  Cardiovascular:     Rate and Rhythm: Normal rate and regular rhythm.  Pulmonary:     Effort: Pulmonary effort is normal.     Breath sounds: Normal breath sounds.  Abdominal:     General: Abdomen is flat.     Palpations: Abdomen is soft.  Musculoskeletal:        General: Normal range of motion.  Skin:    General: Skin is warm and dry.  Neurological:     General: No focal deficit present.     Mental Status: He is alert. Mental status is at baseline.  Psychiatric:        Attention and Perception: Attention normal.        Mood and Affect: Mood is depressed. Affect is blunt.        Speech: Speech is delayed.        Behavior: Behavior is slowed.        Thought Content: Thought content normal.        Cognition and Memory: Cognition is impaired.    Review of Systems  Constitutional: Negative.   HENT: Negative.    Eyes: Negative.   Respiratory: Negative.    Cardiovascular: Negative.   Gastrointestinal: Negative.   Musculoskeletal: Negative.   Skin: Negative.   Neurological: Negative.   Psychiatric/Behavioral:  Positive for depression and suicidal ideas.  The patient is nervous/anxious and has insomnia.    There were no vitals taken for this visit. There is no height or weight on file to calculate BMI.  Treatment Plan Summary: Medication management and Plan I am in touch with the pharmacy getting a complete updated medicine as best as I can.  Medicines will be adjusted appropriately.  Patient will be engaged in individual and group therapy.  Daily assessment of dangerousness and symptomatology.  Full team will work with him on placement ideas ultimately.  Observation Level/Precautions:  15 minute checks  Laboratory:  Chemistry Profile  Psychotherapy:    Medications:    Consultations:    Discharge Concerns:    Estimated LOS:  Other:     Physician Treatment Plan for Primary Diagnosis: Severe recurrent major depression with psychotic features ([place]) Long Term Goal(s): Improvement in symptoms so as ready for discharge  Short Term Goals: Ability to verbalize feelings will improve, Ability to disclose and discuss suicidal ideas, and Ability to demonstrate self-control will improve  Physician Treatment Plan for Secondary Diagnosis: Principal Problem:   Severe recurrent major depression with psychotic features ([place]) Active Problems:   Chronic diastolic heart failure (HCC)   HTN (hypertension)   Diabetes ([place])   HIV positive ([place])  Long Term Goal(s): Improvement in symptoms so as ready for discharge  Short Term Goals: Ability to maintain clinical measurements within normal limits will improve and Compliance with prescribed medications will improve  I certify that inpatient services furnished can reasonably be expected to improve the patient's condition.    Alethia Berthold, MD 10/17/202312:01 PM

## 2022-04-15 NOTE — Progress Notes (Signed)
Patient is A+O x 4. He denies SI/HI/AVH and anxiety. He endorses depression and just overall a feeling of sadness. Patient does agree to contract for safety. Patient is pleasant and speaks in a soft tone. He is cooperative and medication compliant but prefers to isolate to his room to rest. Appetite good. Q15 minute unit checks in place.

## 2022-04-15 NOTE — Plan of Care (Signed)
  Problem: Education: Goal: Knowledge of General Education information will improve Description: Including pain rating scale, medication(s)/side effects and non-pharmacologic comfort measures Outcome: Progressing   Problem: Health Behavior/Discharge Planning: Goal: Ability to manage health-related needs will improve Outcome: Progressing   Problem: Clinical Measurements: Goal: Cardiovascular complication will be avoided Outcome: Progressing   Problem: Nutrition: Goal: Adequate nutrition will be maintained Outcome: Progressing   Problem: Elimination: Goal: Will not experience complications related to bowel motility Outcome: Progressing Goal: Will not experience complications related to urinary retention Outcome: Progressing   

## 2022-04-15 NOTE — ED Provider Notes (Signed)
-----------------------------------------   12:24 AM on 04/15/2022 -----------------------------------------   Patient has been accepted to the geropsychiatric unit at [facility].   Paulette Blanch, MD 04/15/22 [phone]

## 2022-04-15 NOTE — ED Notes (Signed)
Patient unable to sign due to no signature pad. Patient verbalized agreement with treatment plan with admission.

## 2022-04-15 NOTE — BHH Suicide Risk Assessment (Signed)
[facility] Admission Suicide Risk Assessment   Nursing information obtained from:  Patient Demographic factors:  Male, Living alone, Caucasian Current Mental Status:  NA Loss Factors:  Loss of significant relationship ([place] [employer]) Historical Factors:  Prior suicide attempts Risk Reduction Factors:  Positive coping skills or problem solving skills  Total Time spent with patient: 1 hour Principal Problem: Severe recurrent major depression with psychotic features ([place]) Diagnosis:  Principal Problem:   Severe recurrent major depression with psychotic features ([place]) Active Problems:   Chronic diastolic heart failure (HCC)   HTN (hypertension)   Diabetes ([place])   HIV positive ([place])  Subjective Data: Patient seen and chart reviewed.  61 year old man with chronic problems with depression who presented to the hospital complaining of suicidal ideation.  He states that he has been having thoughts of overdosing on his medicine but did not do it.  Feels very sad all and hopeless all the time.  Poor sleep poor appetite poor self-care at home off his medicine for several days.  Currently cooperative with good insight.  Denies intention to harm himself in the hospital.  Continued Clinical Symptoms:    The "Alcohol Use Disorders Identification Test", Guidelines for Use in Primary Care, Second Edition.  World Pharmacologist [facility]). Score between 0-7:  no or low risk or alcohol related problems. Score between 8-15:  moderate risk of alcohol related problems. Score between 16-19:  high risk of alcohol related problems. Score 20 or above:  warrants further diagnostic evaluation for alcohol dependence and treatment.   CLINICAL FACTORS:   Depression:   Anhedonia Hopelessness Insomnia   Musculoskeletal: Strength & Muscle Tone: within normal limits Gait & Station: unsteady Patient leans: N/A  Psychiatric Specialty Exam:  Presentation  General Appearance:  Casual  Eye  Contact: Good  Speech: Clear and Coherent  Speech Volume: Normal  Handedness:No data recorded  Mood and Affect  Mood: Depressed  Affect: Congruent   Thought Process  Thought Processes: Coherent  Descriptions of Associations:Intact  Orientation:Full (Time, Place and Person)  Thought Content:WDL  History of Schizophrenia/Schizoaffective disorder:Yes  Duration of Psychotic Symptoms:No data recorded Hallucinations:Hallucinations: None  Ideas of Reference:None  Suicidal Thoughts:Suicidal Thoughts: Yes, Active SI Active Intent and/or Plan: Without Intent; With Plan; With Means to Carry Out ("overdose on valium")  Homicidal Thoughts:Homicidal Thoughts: No   Sensorium  Memory: Immediate Fair  Judgment: Poor  Insight: Fair   Materials engineer: Fair  Attention Span: Fair  Recall: [employer] of Knowledge: Fair  Language: Fair   Psychomotor Activity  Psychomotor Activity: Psychomotor Activity: Normal (slow, uses walker)   Assets  Assets: Desire for Improvement; Housing; Resilience   Sleep  Sleep: Sleep: Poor    Physical Exam: Physical Exam Vitals and nursing note reviewed.  Constitutional:      Appearance: Normal appearance.  HENT:     Head: Normocephalic and atraumatic.     Mouth/Throat:     Pharynx: Oropharynx is clear.  Eyes:     Pupils: Pupils are equal, round, and reactive to light.  Cardiovascular:     Rate and Rhythm: Normal rate and regular rhythm.  Pulmonary:     Effort: Pulmonary effort is normal.     Breath sounds: Normal breath sounds.  Abdominal:     General: Abdomen is flat.     Palpations: Abdomen is soft.  Musculoskeletal:        General: Normal range of motion.  Skin:    General: Skin is warm and dry.  Neurological:  General: No focal deficit present.     Mental Status: He is alert. Mental status is at baseline.  Psychiatric:        Attention and Perception: Attention normal.         Mood and Affect: Mood is depressed. Affect is blunt.        Speech: Speech is delayed.        Behavior: Behavior is slowed.        Thought Content: Thought content includes suicidal ideation. Thought content does not include suicidal plan.    Review of Systems  Constitutional: Negative.   HENT: Negative.    Eyes: Negative.   Respiratory: Negative.    Cardiovascular: Negative.   Gastrointestinal: Negative.   Musculoskeletal: Negative.   Skin: Negative.   Neurological: Negative.   Psychiatric/Behavioral:  Positive for depression and suicidal ideas. The patient is nervous/anxious and has insomnia.    There were no vitals taken for this visit. There is no height or weight on file to calculate BMI.   COGNITIVE FEATURES THAT CONTRIBUTE TO RISK:  Thought constriction (tunnel vision)    SUICIDE RISK:   Minimal: No identifiable suicidal ideation.  Patients presenting with no risk factors but with morbid ruminations; may be classified as minimal risk based on the severity of the depressive symptoms  PLAN OF CARE: Continue 15-minute checks.  Engage patient in individual and group therapy.  Get medication management straight as possible.  Ongoing assessment of dangerousness prior to discharge.  Work on appropriate placement issues.  I certify that inpatient services furnished can reasonably be expected to improve the patient's condition.   Alethia Berthold, MD 04/15/2022, 11:58 AM

## 2022-04-15 NOTE — ED Notes (Signed)
Report given to Leonard Schwartz. Patient to be transferred to [facility] unit.

## 2022-04-15 NOTE — BH Assessment (Signed)
Pt admitted on the unit at 100 am. Ptatient is AOX4. Patient admitted due to suicidal ideation with a plan to OD Valium. Patients vital signs were take and are WDL. Patient denies SI/HI/AVH. Skin was assessed and was clean. No apparent problem. Patiet is diabetic type II. PMH of Anemia, CKD, and stroke. Psych history of paranoid schizophrenia. Patients brother is aware that patient is here per ED nurse report. Patient lived in a group setting previously.  On call provider is called and voicemail is put. We are still waiting for a return call. Orders are in the chart . Patient has no complains. Patient was taken to his room after assessment was done. Patient is on bed with eyes closed. Patient was on bed with eyes closed most of the night since his admission. Q 15 minutes check are imitated as soon as patient arrived at the unit. We will continue to monitor.
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
## 2022-04-15 NOTE — Progress Notes (Signed)
Pt is alert and oriented X4. Calm and cooperative. Compliant to medications. Denies SI/HI/AVH. Complains of back pain. Refused tylenol. Patient said "tylenol did not do good". NP on call was called voice mail was left on the phone. The writer is expecting a call back. No other issues. Patient is on Q 15 minutes checks. Patient went to bed after ate snacks and took night medications. We will continue to monitor.

## 2022-04-15 NOTE — Group Note (Signed)
[facility] LCSW Group Therapy Note   Group Date: 04/15/2022 Start Time: 1300 End Time: 1400   Type of Therapy and Topic: Group Therapy: Avoiding Self-Sabotaging and Enabling Behaviors  Participation Level: Minimal  Description of Group:  In this group, patients will learn how to identify obstacles, self-sabotaging and enabling behaviors, as well as: what are they, why do we do them and what needs these behaviors meet. Discuss unhealthy relationships and how to have positive healthy boundaries with those that sabotage and enable. Explore aspects of self-sabotage and enabling in yourself and how to limit these self-destructive behaviors in everyday life.   Therapeutic Goals: 1. Patient will identify one obstacle that relates to self-sabotage and enabling behaviors 2. Patient will identify one personal self-sabotaging or enabling behavior they did prior to admission 3. Patient will state a plan to change the above identified behavior 4. Patient will demonstrate ability to communicate their needs through discussion and/or role play.    Summary of Patient Progress:  Patient was present for the entirety of group session. Patient participated in opening and closing remarks. However, patient did not contribute at all to the topic of discussion despite encouraged participation.    Therapeutic Modalities:  Cognitive Behavioral Therapy Person-Centered Therapy Motivational Interviewing    Durenda Hurt, [place]

## 2022-04-16 DIAGNOSIS — F333 Major depressive disorder, recurrent, severe with psychotic symptoms: Secondary | ICD-10-CM | POA: Diagnosis not present

## 2022-04-16 NOTE — Progress Notes (Signed)
[facility] MD Progress Note  04/16/2022 4:42 PM Danny Cook.  MRN:  [id number] Subjective: Follow-up for this 61 year old man with a history of recurrent depression.  Patient seen and also attended treatment team.  Patient denies any suicidal ideation.  Says his mood is much better.  He spoke with representatives from his outpatient team who are asking him to return to his apartment and then they will have a meeting with him to discuss whether he should return to any kind of assisted living facility.  Patient's only specific request today was to be considered for home health at discharge Principal Problem: Severe recurrent major depression with psychotic features ([place]) Diagnosis: Principal Problem:   Severe recurrent major depression with psychotic features ([place]) Active Problems:   Chronic diastolic heart failure (HCC)   HTN (hypertension)   Diabetes ([place])   HIV positive ([place])  Total Time spent with patient: 30 minutes  Past Psychiatric History: Past history of chronic recurrent depression  Past Medical History:  Past Medical History:  Diagnosis Date   Anemia    Anxiety    Arthritis    CHF (congestive heart failure) ([place])    Chronic kidney disease    Renal Insufficiency Syndrome; Glomerulosclerosis 2013   Depressed    Diabetes mellitus without complication (HCC)    GERD (gastroesophageal reflux disease)    Headache    High cholesterol    History of kidney stones    HIV (human immunodeficiency virus infection) ([place])    Hypertension    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[place])    Myocardial infarction ([place])    Paranoid disorder ([place])    Pneumonia    Schizophrenia, paranoid ([place])    Sleep apnea    Stroke ([place])    mini TIA   TIA (transient ischemic attack)     Past Surgical History:  Procedure Laterality Date   CARDIAC CATHETERIZATION     CATARACT EXTRACTION W/PHACO Right 04/25/2021   Procedure: CATARACT EXTRACTION PHACO AND INTRAOCULAR LENS PLACEMENT ([place]) RIGHT DIABETIC;  Surgeon:  Birder Robson, MD;  Location: [facility] ORS;  Service: Ophthalmology;  Laterality: Right;  3.32 0:23.0   CATARACT EXTRACTION W/PHACO Left 09/19/2021   Procedure: CATARACT EXTRACTION PHACO AND INTRAOCULAR LENS PLACEMENT ([place]) LEFT DIABETIC;  Surgeon: Birder Robson, MD;  Location: [facility] ORS;  Service: Ophthalmology;  Laterality: Left;  6.73 0:49.1   COLONOSCOPY WITH PROPOFOL N/A 09/03/2015   Procedure: COLONOSCOPY WITH PROPOFOL;  Surgeon: Lollie Sails, MD;  Location[facility];  Service: Endoscopy;  Laterality: N/A;   COLONOSCOPY WITH PROPOFOL N/A 09/17/2020   Procedure: COLONOSCOPY WITH PROPOFOL;  Surgeon: Lesly Rubenstein, MD;  Location: [facility] ENDOSCOPY;  Service: Endoscopy;  Laterality: N/A;  PREFERS 9AM OR LATER DUE TO TRANSPORTATION   DG TEETH FULL     ESOPHAGOGASTRODUODENOSCOPY (EGD) WITH PROPOFOL N/A 07/01/2016   Dr. [name], [facility]. abnormal esophageal motility, suspicious for presbyesophagus, bile gastritis s/p biopsy, non-bleeding erosive gastropathy s/p biopsy, normal duodenum, path with negative H.pylori and +chronic active gastritis   ESOPHAGOGASTRODUODENOSCOPY (EGD) WITH PROPOFOL N/A 09/17/2020   Procedure: ESOPHAGOGASTRODUODENOSCOPY (EGD) WITH PROPOFOL;  Surgeon: Lesly Rubenstein, MD;  Location: [facility] ENDOSCOPY;  Service: Endoscopy;  Laterality: N/A;   EXTRACORPOREAL SHOCK WAVE LITHOTRIPSY Right 11/01/2020   Procedure: EXTRACORPOREAL SHOCK WAVE LITHOTRIPSY (ESWL);  Surgeon: Abbie Sons, MD;  Location: [facility] ORS;  Service: Urology;  Laterality: Right;   LUMBAR LAMINECTOMY/DECOMPRESSION MICRODISCECTOMY N/A 07/15/2021   Procedure: L3-5 DECOMPRESSION;  Surgeon: Meade Maw, MD;  Location: [facility] ORS;  Service: Neurosurgery;  Laterality: N/A;   Family History:  Family History  Problem Relation Age of Onset   Heart attack Mother    Diabetes Mellitus II Mother    Mental illness Mother    CAD Mother    Heart attack Father    CAD Father    Hypertension Father     Family Psychiatric  History: See previous Social History:  Social History   Substance and Sexual Activity  Alcohol Use No     Social History   Substance and Sexual Activity  Drug Use No   Comment: denies street drugs    Social History   Socioeconomic History   Marital status: Single    Spouse name: Not on file   Number of children: Not on file   Years of education: 11   Highest education level: GED or equivalent  Occupational History   Not on file  Tobacco Use   Smoking status: Never   Smokeless tobacco: Never  Vaping Use   Vaping Use: Never used  Substance and Sexual Activity   Alcohol use: No   Drug use: No    Comment: denies street drugs   Sexual activity: Never  Other Topics Concern   Not on file  Social History Narrative   Not on file   Social Determinants of Health   Financial Resource Strain: Low Risk  (08/12/2017)   Overall Financial Resource Strain (CARDIA)    Difficulty of Paying Living Expenses: Not hard at all  Food Insecurity: Food Insecurity Present (04/15/2022)   Hunger Vital Sign    Worried About [place] in the Last Year: Sometimes true    Ran Out of Food in the Last Year: Sometimes true  Transportation Needs: Unmet Transportation Needs (04/15/2022)   PRAPARE - Hydrologist (Medical): Yes    Lack of Transportation (Non-Medical): Yes  Physical Activity: Inactive (08/12/2017)   Exercise Vital Sign    Days of Exercise per Week: 0 days    Minutes of Exercise per Session: 0 min  Stress: Stress Concern Present (08/12/2017)   [place]    Feeling of Stress : To some extent  Social Connections: Moderately Isolated (08/12/2017)   Social Connection and Isolation Panel [NHANES]    Frequency of Communication with Friends and Family: More than three times a week    Frequency of Social Gatherings with Friends and Family: Three times a week     Attends Religious Services: Never    Active Member of Clubs or Organizations: No    Attends Archivist Meetings: Never    Marital Status: Never married   Additional Social History:                         Sleep: Fair  Appetite:  Fair  Current Medications: Current Facility-Administered Medications  Medication Dose Route Frequency Provider Last Rate Last Admin   abacavir-dolutegravir-lamiVUDine (TRIUMEQ) 600-50-300 MG per tablet 1 tablet  1 tablet Oral QHS Caroline Sauger, NP   1 tablet at 04/15/22 2132   acetaminophen (TYLENOL) tablet 650 mg  650 mg Oral Q6H PRN Caroline Sauger, NP       albuterol (VENTOLIN HFA) 108 (90 Base) MCG/ACT inhaler 2 puff  2 puff Inhalation Q6H PRN Caroline Sauger, NP       alum & mag hydroxide-simeth (MAALOX/MYLANTA) 200-200-20 MG/5ML suspension 30 mL  30 mL Oral Q4H PRN Caroline Sauger, NP  aspirin tablet 325 mg  325 mg Oral Daily Caroline Sauger, NP   325 mg at 04/16/22 0908   diazepam (VALIUM) tablet 5 mg  5 mg Oral TID Kamal Jurgens, Madie Reno, MD   5 mg at 04/16/22 0906   dicyclomine (BENTYL) capsule 10 mg  10 mg Oral TID AC & HS Caroline Sauger, NP   10 mg at 04/16/22 1139   divalproex (DEPAKOTE ER) 24 hr tablet 500 mg  500 mg Oral QHS Caroline Sauger, NP   500 mg at 04/15/22 2138   donepezil (ARICEPT) tablet 5 mg  5 mg Oral Daily Caroline Sauger, NP   5 mg at 04/16/22 0906   fluticasone (FLOVENT HFA) 220 MCG/ACT inhaler 2 puff  2 puff Inhalation BID Caroline Sauger, NP   2 puff at 04/16/22 0905   gabapentin (NEURONTIN) capsule 800 mg  800 mg Oral TID Caroline Sauger, NP   800 mg at 04/16/22 0908   losartan (COZAAR) tablet 25 mg  25 mg Oral Daily Caroline Sauger, NP   25 mg at 04/16/22 0906   magnesium hydroxide (MILK OF MAGNESIA) suspension 30 mL  30 mL Oral Daily PRN Caroline Sauger, NP   30 mL at 04/16/22 1139   memantine (NAMENDA) tablet 10 mg  10 mg Oral BID Caroline Sauger, NP   10 mg at 04/16/22 0906   metFORMIN (GLUCOPHAGE) tablet 1,000 mg  1,000 mg Oral BID WC Caroline Sauger, NP   1,000 mg at 04/16/22 0906   mirtazapine (REMERON) tablet 45 mg  45 mg Oral QHS Raylei Losurdo T, MD   45 mg at 04/15/22 2128   venlafaxine XR (EFFEXOR-XR) 24 hr capsule 150 mg  150 mg Oral Q breakfast Ece Cumberland T, MD   150 mg at 04/16/22 7262   ziprasidone (GEODON) capsule 40 mg  40 mg Oral BID WC Carlisle Torgeson, Madie Reno, MD   40 mg at 04/16/22 0908    Lab Results:  Results for orders placed or performed during the hospital encounter of 04/15/22 (from the past 48 hour(s))  Glucose, capillary     Status: Abnormal   Collection Time: 04/15/22  7:41 AM  Result Value Ref Range   Glucose-Capillary 120 (H) 70 - 99 mg/dL    Comment: Glucose reference range applies only to samples taken after fasting for at least 8 hours.  Hemoglobin A1c     Status: Abnormal   Collection Time: 04/15/22 12:45 PM  Result Value Ref Range   Hgb A1c MFr Bld 5.8 (H) 4.8 - 5.6 %    Comment: (NOTE) Pre diabetes:          5.7%-6.4%  Diabetes:              >6.4%  Glycemic control for   <7.0% adults with diabetes    Mean Plasma Glucose 119.76 mg/dL    Comment: Performed at [facility] [address], [place], [place] [address]  Lipid panel     Status: Abnormal   Collection Time: 04/15/22 12:45 PM  Result Value Ref Range   Cholesterol 154 0 - 200 mg/dL   Triglycerides 259 (H) <150 mg/dL   HDL 27 (L) >40 mg/dL   Total CHOL/HDL Ratio 5.7 RATIO   VLDL 52 (H) 0 - 40 mg/dL   LDL Cholesterol 75 0 - 99 mg/dL    Comment:        Total Cholesterol/HDL:CHD Risk Coronary Heart Disease Risk Table  Men   Women  1/2 Average Risk   3.4   3.3  Average Risk       5.0   4.4  2 X Average Risk   9.6   7.1  3 X Average Risk  23.4   11.0        Use the calculated Patient Ratio above and the CHD Risk Table to determine the patient's CHD Risk.        ATP III CLASSIFICATION  (LDL):  <100     mg/dL   Optimal  100-129  mg/dL   Near or Above                    Optimal  130-159  mg/dL   Borderline  160-189  mg/dL   High  >190     mg/dL   Very High Performed at [facility], [address] [place], [place] [address]     Blood Alcohol level:  Lab Results  Component Value Date   [facility] <10 04/14/2022   ETH <10 78/24/2353    Metabolic Disorder Labs: Lab Results  Component Value Date   HGBA1C 5.8 (H) 04/15/2022   MPG 119.76 04/15/2022   MPG 134.11 07/31/2020   No results found for: "PROLACTIN" Lab Results  Component Value Date   CHOL 154 04/15/2022   TRIG 259 (H) 04/15/2022   HDL 27 (L) 04/15/2022   CHOLHDL 5.7 04/15/2022   VLDL 52 (H) 04/15/2022   LDLCALC 75 04/15/2022   LDLCALC 106 (H) 11/05/2017    Physical Findings: AIMS:  , ,  ,  ,    CIWA:    COWS:     Musculoskeletal: Strength & Muscle Tone: within normal limits Gait & Station: normal Patient leans: N/A  Psychiatric Specialty Exam:  Presentation  General Appearance:  Casual  Eye Contact: Good  Speech: Clear and Coherent  Speech Volume: Normal  Handedness:No data recorded  Mood and Affect  Mood: Depressed  Affect: Congruent   Thought Process  Thought Processes: Coherent  Descriptions of Associations:Intact  Orientation:Full (Time, Place and Person)  Thought Content:WDL  History of Schizophrenia/Schizoaffective disorder:Yes  Duration of Psychotic Symptoms:No data recorded Hallucinations:No data recorded Ideas of Reference:None  Suicidal Thoughts:No data recorded Homicidal Thoughts:No data recorded  Sensorium  Memory: Immediate Fair  Judgment: Poor  Insight: Fair   Materials engineer: Fair  Attention Span: Fair  Recall: [employer] of Knowledge: Fair  Language: Fair   Psychomotor Activity  Psychomotor Activity:No data recorded  Assets  Assets: Desire for Improvement; Housing; Resilience   Sleep   Sleep:No data recorded   Physical Exam: Physical Exam Vitals and nursing note reviewed.  Constitutional:      Appearance: Normal appearance.  HENT:     Head: Normocephalic and atraumatic.     Mouth/Throat:     Pharynx: Oropharynx is clear.  Eyes:     Pupils: Pupils are equal, round, and reactive to light.  Cardiovascular:     Rate and Rhythm: Normal rate and regular rhythm.  Pulmonary:     Effort: Pulmonary effort is normal.     Breath sounds: Normal breath sounds.  Abdominal:     General: Abdomen is flat.     Palpations: Abdomen is soft.  Musculoskeletal:        General: Normal range of motion.  Skin:    General: Skin is warm and dry.  Neurological:     General: No focal deficit present.  Mental Status: He is alert. Mental status is at baseline.  Psychiatric:        Mood and Affect: Mood normal.        Thought Content: Thought content normal.    Review of Systems  Constitutional: Negative.   HENT: Negative.    Eyes: Negative.   Respiratory: Negative.    Cardiovascular: Negative.   Gastrointestinal: Negative.   Musculoskeletal: Negative.   Skin: Negative.   Neurological: Negative.   Psychiatric/Behavioral: Negative.     Blood pressure 124/76, pulse 66, temperature 98 F (36.7 C), temperature source Oral, resp. rate 18, SpO2 99 %. There is no height or weight on file to calculate BMI.   Treatment Plan Summary: Plan no change to medication.  Orders placed for physical therapy and Occupational Therapy consults to assist with the evaluation that would be required if home health could be obtained.  Encourage group attendance.  We will work with the patient and his discharge plan around what is available from his outpatient team.  Alethia Berthold, MD 04/16/2022, 4:42 PM

## 2022-04-16 NOTE — BH IP Treatment Plan (Signed)
Interdisciplinary Treatment and Diagnostic Plan Update  04/16/2022 Time of Session: 3:30PM Danny Cook. MRN: [id number]  Principal Diagnosis: Severe recurrent major depression with psychotic features ([place])  Secondary Diagnoses: Principal Problem:   Severe recurrent major depression with psychotic features ([place]) Active Problems:   Chronic diastolic heart failure ([place])   HTN (hypertension)   Diabetes ([place])   HIV positive ([place])   Current Medications:  Current Facility-Administered Medications  Medication Dose Route Frequency Provider Last Rate Last Admin   abacavir-dolutegravir-lamiVUDine (TRIUMEQ) 600-50-300 MG per tablet 1 tablet  1 tablet Oral QHS Caroline Sauger, NP   1 tablet at 04/15/22 2132   acetaminophen (TYLENOL) tablet 650 mg  650 mg Oral Q6H PRN Caroline Sauger, NP       albuterol (VENTOLIN HFA) 108 (90 Base) MCG/ACT inhaler 2 puff  2 puff Inhalation Q6H PRN Caroline Sauger, NP       alum & mag hydroxide-simeth (MAALOX/MYLANTA) 200-200-20 MG/5ML suspension 30 mL  30 mL Oral Q4H PRN Caroline Sauger, NP       aspirin tablet 325 mg  325 mg Oral Daily Caroline Sauger, NP   325 mg at 04/16/22 0908   diazepam (VALIUM) tablet 5 mg  5 mg Oral TID Clapacs, Madie Reno, MD   5 mg at 04/16/22 0906   dicyclomine (BENTYL) capsule 10 mg  10 mg Oral TID AC & HS Caroline Sauger, NP   10 mg at 04/16/22 1139   divalproex (DEPAKOTE ER) 24 hr tablet 500 mg  500 mg Oral QHS Caroline Sauger, NP   500 mg at 04/15/22 2138   donepezil (ARICEPT) tablet 5 mg  5 mg Oral Daily Caroline Sauger, NP   5 mg at 04/16/22 0906   fluticasone (FLOVENT HFA) 220 MCG/ACT inhaler 2 puff  2 puff Inhalation BID Caroline Sauger, NP   2 puff at 04/16/22 0905   gabapentin (NEURONTIN) capsule 800 mg  800 mg Oral TID Caroline Sauger, NP   800 mg at 04/16/22 0908   losartan (COZAAR) tablet 25 mg  25 mg Oral Daily Caroline Sauger, NP   25 mg at 04/16/22 0906    magnesium hydroxide (MILK OF MAGNESIA) suspension 30 mL  30 mL Oral Daily PRN Caroline Sauger, NP   30 mL at 04/16/22 1139   memantine (NAMENDA) tablet 10 mg  10 mg Oral BID Caroline Sauger, NP   10 mg at 04/16/22 0906   metFORMIN (GLUCOPHAGE) tablet 1,000 mg  1,000 mg Oral BID WC Caroline Sauger, NP   1,000 mg at 04/16/22 0906   mirtazapine (REMERON) tablet 45 mg  45 mg Oral QHS Clapacs, John T, MD   45 mg at 04/15/22 2128   venlafaxine XR (EFFEXOR-XR) 24 hr capsule 150 mg  150 mg Oral Q breakfast Clapacs, John T, MD   150 mg at 04/16/22 7824   ziprasidone (GEODON) capsule 40 mg  40 mg Oral BID WC Clapacs, Madie Reno, MD   40 mg at 04/16/22 0908   PTA Medications: Medications Prior to Admission  Medication Sig Dispense Refill Last Dose   abacavir-dolutegravir-lamiVUDine (TRIUMEQ) 600-50-300 MG tablet Take 1 tablet by mouth at bedtime. 30 tablet 6    acetaminophen (TYLENOL) 650 MG CR tablet Take 650 mg by mouth every 8 (eight) hours as needed for pain.      albuterol (PROVENTIL HFA;VENTOLIN HFA) 108 (90 Base) MCG/ACT inhaler Inhale 2 puffs into the lungs every 6 (six) hours as needed for wheezing or shortness of breath. 1 Inhaler 2  aspirin 325 MG tablet Take 325 mg by mouth daily.      benztropine (COGENTIN) 0.5 MG tablet Take 1.5 mg by mouth daily. Pt unsure of dose (Patient not taking: Reported on 04/14/2022)      bismuth subsalicylate (PEPTO BISMOL) 262 MG/15ML suspension Take 15 mLs by mouth every 6 (six) hours as needed for indigestion. 1 tbsp      brimonidine-timolol (COMBIGAN) 0.2-0.5 % ophthalmic solution Place 1 drop into both eyes every 12 (twelve) hours.      diazepam (VALIUM) 5 MG tablet Take 2.5-5 mg by mouth See admin instructions. 5 mg in the morning, 2.5 mg midday, 5 mg in the evening      dicyclomine (BENTYL) 10 MG capsule Take 10 mg by mouth 4 (four) times daily -  before meals and at bedtime.      divalproex (DEPAKOTE ER) 500 MG 24 hr tablet Take 500 mg by mouth at  bedtime.       docusate sodium (COLACE) 250 MG capsule Take 250 mg by mouth 2 (two) times daily.      donepezil (ARICEPT) 10 MG tablet Take 5 mg by mouth daily.      FANAPT 12 MG TABS Take 12 mg by mouth 2 (two) times daily.  (Patient not taking: Reported on 04/14/2022)      Fluocinolone Acetonide 0.01 % OIL Place 2-4 drops in ear(s) every other day. At bedtime for dry skin      fluticasone (FLOVENT HFA) 220 MCG/ACT inhaler Inhale 2 puffs into the lungs 2 (two) times daily. Rinse out mouth afterwards 1 Inhaler 12    gabapentin (NEURONTIN) 600 MG tablet Take 800 mg by mouth 3 (three) times daily.      gemfibrozil (LOPID) 600 MG tablet Take 600 mg by mouth 2 (two) times daily before a meal.      glimepiride (AMARYL) 4 MG tablet Take 4 mg by mouth daily with breakfast.       glycopyrrolate (ROBINUL) 2 MG tablet Take 0.5 mg by mouth 2 (two) times daily.      guaiFENesin (MUCINEX) 600 MG 12 hr tablet Take 600 mg by mouth 2 (two) times daily.      HUMALOG KWIKPEN 100 UNIT/ML KwikPen Inject 6 Units into the skin 3 (three) times daily. Only takes Humalog if blood sugar > 200. Takes 2 units if > 200. Additional instructions per sliding scale (Patient not taking: Reported on 04/14/2022)      Insulin Degludec (TRESIBA FLEXTOUCH) 200 UNIT/ML SOPN Inject 10 Units into the skin daily. (Patient not taking: Reported on 04/14/2022)      losartan (COZAAR) 25 MG tablet Take 25 mg by mouth daily.      memantine (NAMENDA) 10 MG tablet Take 10 mg by mouth 2 (two) times daily.      metFORMIN (GLUCOPHAGE) 1000 MG tablet Take 1,000 mg by mouth 2 (two) times daily with a meal.      methocarbamol (ROBAXIN) 500 MG tablet Take 1 tablet (500 mg total) by mouth 4 (four) times daily. 120 tablet 0    mirtazapine (REMERON) 30 MG tablet Take 30 mg by mouth at bedtime.      mometasone (ELOCON) 0.1 % ointment Apply 1 application topically See admin instructions. Apply 4 drops to ear canal at bedtime every other night for itching and  dry skin.      nitroGLYCERIN (NITROSTAT) 0.4 MG SL tablet Place 0.4 mg under the tongue every 5 (five) minutes as needed for chest pain.  Omega-3 Fatty Acids (FISH OIL) 1000 MG CAPS Take 1,000 mg by mouth daily.      omeprazole (PRILOSEC) 40 MG capsule Take 40 mg by mouth 2 (two) times daily.       OZEMPIC, 1 MG/DOSE, 4 MG/3ML SOPN Inject 2 mg into the skin every Monday. (Patient not taking: Reported on 04/14/2022)      rosuvastatin (CRESTOR) 40 MG tablet Take 40 mg by mouth daily.       senna (SENOKOT) 8.6 MG TABS tablet Take 1 tablet by mouth daily.       sucralfate (CARAFATE) 1 g tablet Take 1 g by mouth 4 (four) times daily.  (Patient not taking: Reported on 04/14/2022)      tamsulosin (FLOMAX) 0.4 MG CAPS capsule Take 0.4 mg by mouth daily.      venlafaxine (EFFEXOR) 37.5 MG tablet Take 37.5 mg by mouth 2 (two) times daily. Take with 75 mg in am      venlafaxine XR (EFFEXOR-XR) 75 MG 24 hr capsule Take 1 capsule (75 mg total) by mouth daily with breakfast. 30 capsule 0    ziprasidone (GEODON) 40 MG capsule Take 40 mg by mouth daily.       Patient Stressors:    Patient Strengths:    Treatment Modalities: Medication Management, Group therapy, Case management,  1 to 1 session with clinician, Psychoeducation, Recreational therapy.   Physician Treatment Plan for Primary Diagnosis: Severe recurrent major depression with psychotic features ([place]) Long Term Goal(s): Improvement in symptoms so as ready for discharge   Short Term Goals: Ability to maintain clinical measurements within normal limits will improve Compliance with prescribed medications will improve Ability to verbalize feelings will improve Ability to disclose and discuss suicidal ideas Ability to demonstrate self-control will improve  Medication Management: Evaluate patient's response, side effects, and tolerance of medication regimen.  Therapeutic Interventions: 1 to 1 sessions, Unit Group sessions and Medication  administration.  Evaluation of Outcomes: Not Met  Physician Treatment Plan for Secondary Diagnosis: Principal Problem:   Severe recurrent major depression with psychotic features ([place]) Active Problems:   Chronic diastolic heart failure ([facility])   HTN (hypertension)   Diabetes ([place])   HIV positive ([place])  Long Term Goal(s): Improvement in symptoms so as ready for discharge   Short Term Goals: Ability to maintain clinical measurements within normal limits will improve Compliance with prescribed medications will improve Ability to verbalize feelings will improve Ability to disclose and discuss suicidal ideas Ability to demonstrate self-control will improve     Medication Management: Evaluate patient's response, side effects, and tolerance of medication regimen.  Therapeutic Interventions: 1 to 1 sessions, Unit Group sessions and Medication administration.  Evaluation of Outcomes: Not Met   RN Treatment Plan for Primary Diagnosis: Severe recurrent major depression with psychotic features ([place]) Long Term Goal(s): Knowledge of disease and therapeutic regimen to maintain health will improve  Short Term Goals: Ability to demonstrate self-control, Ability to participate in decision making will improve, Ability to verbalize feelings will improve, Ability to disclose and discuss suicidal ideas, Ability to identify and develop effective coping behaviors will improve, and Compliance with prescribed medications will improve  Medication Management: RN will administer medications as ordered by provider, will assess and evaluate patient's response and provide education to patient for prescribed medication. RN will report any adverse and/or side effects to prescribing provider.  Therapeutic Interventions: 1 on 1 counseling sessions, Psychoeducation, Medication administration, Evaluate responses to treatment, Monitor vital signs and CBGs as ordered, Perform/monitor  CIWA, COWS, AIMS and Fall Risk  screenings as ordered, Perform wound care treatments as ordered.  Evaluation of Outcomes: Not Met   LCSW Treatment Plan for Primary Diagnosis: Severe recurrent major depression with psychotic features ([place]) Long Term Goal(s): Safe transition to appropriate next level of care at discharge, Engage patient in therapeutic group addressing interpersonal concerns.  Short Term Goals: Engage patient in aftercare planning with referrals and resources, Increase social support, Increase ability to appropriately verbalize feelings, Increase emotional regulation, Facilitate acceptance of mental health diagnosis and concerns, and Increase skills for wellness and recovery  Therapeutic Interventions: Assess for all discharge needs, 1 to 1 time with Social worker, Explore available resources and support systems, Assess for adequacy in community support network, Educate family and significant other(s) on suicide prevention, Complete Psychosocial Assessment, Interpersonal group therapy.  Evaluation of Outcomes: Not Met   Progress in Treatment: Attending groups: No. Participating in groups: No. Taking medication as prescribed: Yes. Toleration medication: Yes. Family/Significant other contact made: Yes, individual(s) contacted:  SPE completed with the patient's brother Patient understands diagnosis: Yes. Discussing patient identified problems/goals with staff: Yes. Medical problems stabilized or resolved: Yes. Denies suicidal/homicidal ideation: Yes. Issues/concerns per patient self-inventory: No. Other: none  New problem(s) identified: No, Describe:  none  New Short Term/Long Term Goal(s): medication management for mood stabilization; elimination of SI thoughts; development of comprehensive mental wellness plan.   Patient Goals: "I know I got to get back home"   Discharge Plan or Barriers: Patient was initially reporting plans of being discharged to a [facility].  However, this appears to have  changed and patient is considering a return to his home.  Psychiatrist is putting in consults for PT and OT to determine needs for any change in living situation and or orders for home health PT/OT.  Reason for Continuation of Hospitalization: Anxiety Depression Medication stabilization Suicidal ideation  Estimated Length of Stay:  1-7 days  Last 3 [place] Suicide Severity Risk Score: [place] Admission (Current) from 04/15/2022 in [place] ED from 04/14/2022 in [place] [facility] from 04/06/2022 in [place] High Risk Moderate Risk No Risk       Last PHQ 2/9 Scores:    09/13/2021    9:02 AM 03/05/2021    9:21 AM 01/26/2018   11:03 AM  Depression screen PHQ 2/9  Decreased Interest 0 0 0  Down, Depressed, Hopeless 0 0 1  PHQ - 2 Score 0 0 1    Scribe for Treatment Team: Rozann Lesches, Marlinda Mike 04/16/2022 3:35 PM

## 2022-04-16 NOTE — Group Note (Signed)
[place] LCSW Group Therapy Note   Group Date: 04/16/2022 Start Time: 7793 End Time: 1415   Type of Therapy/Topic:  Group Therapy:  Emotion Regulation  Participation Level:  Did Not Attend   Mood:  Description of Group:    The purpose of this group is to assist patients in learning to regulate negative emotions and experience positive emotions. Patients will be guided to discuss ways in which they have been vulnerable to their negative emotions. These vulnerabilities will be juxtaposed with experiences of positive emotions or situations, and patients challenged to use positive emotions to combat negative ones. Special emphasis will be placed on coping with negative emotions in conflict situations, and patients will process healthy conflict resolution skills.  Therapeutic Goals: Patient will identify two positive emotions or experiences to reflect on in order to balance out negative emotions:  Patient will label two or more emotions that they find the most difficult to experience:  Patient will be able to demonstrate positive conflict resolution skills through discussion or role plays:   Summary of Patient Progress: Patient was asleep at group start.  [name] did not wake patient for group.    Therapeutic Modalities:   Cognitive Behavioral Therapy Feelings Identification Dialectical Behavioral Therapy   Rozann Lesches, LCSW

## 2022-04-16 NOTE — Progress Notes (Signed)
Patient is alert and oriented times 4. Mood and affect flat. Patient denies any pain at this time. He denies SI, HI, and AVH. Also denies feelings of  depression at this time. He expresses having anxiety 8/10. States he slept good last night. Morning meds given whole by mouth W/O difficulty. Ate breakfast in day room- appetite good. Patient remains on unit with Q15 minute checks in place.

## 2022-04-16 NOTE — Progress Notes (Signed)
   04/16/22 0733  Psych Admission Type (Psych Patients Only)  Admission Status Voluntary  Psychosocial Assessment  Patient Complaints Anxiety  Eye Contact Fair  Facial Expression Flat  Affect Flat  Speech Logical/coherent  Interaction Assertive  Motor Activity Slow  Appearance/Hygiene Unremarkable  Behavior Characteristics Cooperative;Calm  Mood Pleasant  Thought Process  Coherency WDL  Content WDL  Delusions None reported or observed  Perception WDL  Hallucination None reported or observed  Judgment WDL  Confusion WDL  Danger to Self  Current suicidal ideation? Denies  Danger to Others  Danger to Others None reported or observed

## 2022-04-16 NOTE — Plan of Care (Signed)

## 2022-04-17 DIAGNOSIS — F333 Major depressive disorder, recurrent, severe with psychotic symptoms: Secondary | ICD-10-CM | POA: Diagnosis not present

## 2022-04-17 LAB — GLUCOSE, CAPILLARY: Glucose-Capillary: 121 mg/dL — ABNORMAL HIGH (ref 70–99)

## 2022-04-17 MED ORDER — HYDROXYZINE HCL 25 MG PO TABS
25.0000 mg | ORAL_TABLET | Freq: Three times a day (TID) | ORAL | Status: DC | PRN
  Administered 2022-04-17 – 2022-04-22 (×9): 25 mg via ORAL
  Filled 2022-04-17 (×9): qty 1

## 2022-04-17 NOTE — Progress Notes (Signed)
[facility] MD Progress Note  04/17/2022 11:13 AM Danny Cook.  MRN:  [id number] Subjective: Patient seen and chart reviewed.  61 year old man with recurrent depression and anxiety.  Patient states that he is feeling nervous today because of discharge.  He has come to understand that the recommendation of the system will be for him to go back to his independent living and then have a follow-up assessment about whether he needs to go back into a living situation.  This is not what he would prefer and he is nervous about it.  Nevertheless not reporting any suicidal or homicidal ideation.  PT and OT consults still pending. Principal Problem: Severe recurrent major depression with psychotic features ([place]) Diagnosis: Principal Problem:   Severe recurrent major depression with psychotic features ([place]) Active Problems:   Chronic diastolic heart failure (HCC)   HTN (hypertension)   Diabetes ([place])   HIV positive ([place])  Total Time spent with patient: 30 minutes  Past Psychiatric History: Past history of chronic anxiety and depression.  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[place])    Myocardial infarction ([place])    Paranoid disorder ([place])    Pneumonia    Schizophrenia, paranoid ([place])    Sleep apnea    Stroke ([place])    mini TIA   TIA (transient ischemic attack)     Past Surgical History:  Procedure Laterality Date   CARDIAC CATHETERIZATION     CATARACT EXTRACTION W/PHACO Right 04/25/2021   Procedure: CATARACT EXTRACTION PHACO AND INTRAOCULAR LENS  PLACEMENT ([place]) RIGHT DIABETIC;  Surgeon: Birder Robson, MD;  Location: [facility] ORS;  Service: Ophthalmology;  Laterality: Right;  3.32 0:23.0   CATARACT EXTRACTION W/PHACO Left 09/19/2021   Procedure: CATARACT EXTRACTION PHACO AND INTRAOCULAR LENS PLACEMENT ([place]) LEFT DIABETIC;  Surgeon: Birder Robson, MD;  Location: [facility] ORS;  Service: Ophthalmology;  Laterality: Left;  6.73 0:49.1   COLONOSCOPY WITH PROPOFOL N/A 09/03/2015   Procedure: COLONOSCOPY WITH PROPOFOL;  Surgeon: Lollie Sails, MD;  Location: [facility] ENDOSCOPY;  Service: Endoscopy;  Laterality: N/A;   COLONOSCOPY WITH PROPOFOL N/A 09/17/2020   Procedure: COLONOSCOPY WITH PROPOFOL;  Surgeon: Lesly Rubenstein, MD;  Location: [facility] ENDOSCOPY;  Service: Endoscopy;  Laterality: N/A;  PREFERS 9AM OR LATER DUE TO TRANSPORTATION   DG TEETH FULL     ESOPHAGOGASTRODUODENOSCOPY (EGD) WITH PROPOFOL N/A 07/01/2016   Dr. [name], [facility]. abnormal esophageal motility, suspicious for presbyesophagus, bile gastritis s/p biopsy, non-bleeding erosive gastropathy s/p biopsy, normal duodenum, path with negative H.pylori and +chronic active gastritis   ESOPHAGOGASTRODUODENOSCOPY (EGD) WITH PROPOFOL N/A 09/17/2020   Procedure: ESOPHAGOGASTRODUODENOSCOPY (EGD) WITH PROPOFOL;  Surgeon: Lesly Rubenstein, MD;  Location: [facility] ENDOSCOPY;  Service: Endoscopy;  Laterality: N/A;   EXTRACORPOREAL SHOCK WAVE LITHOTRIPSY Right 11/01/2020   Procedure: EXTRACORPOREAL SHOCK WAVE LITHOTRIPSY (ESWL);  Surgeon: Abbie Sons, MD;  Location: [facility] ORS;  Service: Urology;  Laterality: Right;   LUMBAR LAMINECTOMY/DECOMPRESSION MICRODISCECTOMY N/A 07/15/2021   Procedure: L3-5 DECOMPRESSION;  Surgeon: Izora Ribas,  Ardyth Gal, MD;  Location: [facility] ORS;  Service: Neurosurgery;  Laterality: N/A;   Family History:  Family History  Problem Relation Age of Onset   Heart attack Mother    Diabetes Mellitus II Mother    Mental illness Mother    CAD Mother    Heart attack Father     CAD Father    Hypertension Father    Family Psychiatric  History: See previous Social History:  Social History   Substance and Sexual Activity  Alcohol Use No     Social History   Substance and Sexual Activity  Drug Use No   Comment: denies street drugs    Social History   Socioeconomic History   Marital status: Single    Spouse name: Not on file   Number of children: Not on file   Years of education: 11   Highest education level: GED or equivalent  Occupational History   Not on file  Tobacco Use   Smoking status: Never   Smokeless tobacco: Never  Vaping Use   Vaping Use: Never used  Substance and Sexual Activity   Alcohol use: No   Drug use: No    Comment: denies street drugs   Sexual activity: Never  Other Topics Concern   Not on file  Social History Narrative   Not on file   Social Determinants of Health   Financial Resource Strain: Low Risk  (08/12/2017)   Overall Financial Resource Strain (CARDIA)    Difficulty of Paying Living Expenses: Not hard at all  Food Insecurity: Food Insecurity Present (04/15/2022)   Hunger Vital Sign    Worried About [place] in the Last Year: Sometimes true    Ran Out of Food in the Last Year: Sometimes true  Transportation Needs: Unmet Transportation Needs (04/15/2022)   PRAPARE - Hydrologist (Medical): Yes    Lack of Transportation (Non-Medical): Yes  Physical Activity: Inactive (08/12/2017)   Exercise Vital Sign    Days of Exercise per Week: 0 days    Minutes of Exercise per Session: 0 min  Stress: Stress Concern Present (08/12/2017)   [place]    Feeling of Stress : To some extent  Social Connections: Moderately Isolated (08/12/2017)   Social Connection and Isolation Panel [NHANES]    Frequency of Communication with Friends and Family: More than three times a week    Frequency of Social Gatherings with Friends  and Family: Three times a week    Attends Religious Services: Never    Active Member of Clubs or Organizations: No    Attends Archivist Meetings: Never    Marital Status: Never married   Additional Social History:                         Sleep: Fair  Appetite:  Fair  Current Medications: Current Facility-Administered Medications  Medication Dose Route Frequency Provider Last Rate Last Admin   abacavir-dolutegravir-lamiVUDine (TRIUMEQ) 600-50-300 MG per tablet 1 tablet  1 tablet Oral QHS Caroline Sauger, NP   1 tablet at 04/16/22 2228   acetaminophen (TYLENOL) tablet 650 mg  650 mg Oral Q6H PRN Caroline Sauger, NP       albuterol (VENTOLIN HFA) 108 (90 Base) MCG/ACT inhaler 2 puff  2 puff Inhalation Q6H PRN Caroline Sauger, NP       alum & mag hydroxide-simeth (MAALOX/MYLANTA) 200-200-20 MG/5ML suspension 30  mL  30 mL Oral Q4H PRN Caroline Sauger, NP       aspirin tablet 325 mg  325 mg Oral Daily Caroline Sauger, NP   325 mg at 04/17/22 0819   diazepam (VALIUM) tablet 5 mg  5 mg Oral TID Lisbeth Puller, Madie Reno, MD   5 mg at 04/17/22 0818   dicyclomine (BENTYL) capsule 10 mg  10 mg Oral TID AC & HS Caroline Sauger, NP   10 mg at 04/17/22 0818   divalproex (DEPAKOTE ER) 24 hr tablet 500 mg  500 mg Oral QHS Caroline Sauger, NP   500 mg at 04/16/22 2228   donepezil (ARICEPT) tablet 5 mg  5 mg Oral Daily Caroline Sauger, NP   5 mg at 04/17/22 0818   fluticasone (FLOVENT HFA) 220 MCG/ACT inhaler 2 puff  2 puff Inhalation BID Caroline Sauger, NP   2 puff at 04/17/22 0820   gabapentin (NEURONTIN) capsule 800 mg  800 mg Oral TID Caroline Sauger, NP   800 mg at 04/17/22 0093   hydrOXYzine (ATARAX) tablet 25 mg  25 mg Oral TID PRN Almon Whitford, Madie Reno, MD       losartan (COZAAR) tablet 25 mg  25 mg Oral Daily Caroline Sauger, NP   25 mg at 04/17/22 0820   magnesium hydroxide (MILK OF MAGNESIA) suspension 30 mL  30 mL Oral Daily PRN  Caroline Sauger, NP   30 mL at 04/16/22 1139   memantine (NAMENDA) tablet 10 mg  10 mg Oral BID Caroline Sauger, NP   10 mg at 04/17/22 0818   metFORMIN (GLUCOPHAGE) tablet 1,000 mg  1,000 mg Oral BID WC Caroline Sauger, NP   1,000 mg at 04/17/22 0819   mirtazapine (REMERON) tablet 45 mg  45 mg Oral QHS Benecio Kluger T, MD   45 mg at 04/16/22 2227   venlafaxine XR (EFFEXOR-XR) 24 hr capsule 150 mg  150 mg Oral Q breakfast Dealie Koelzer, Madie Reno, MD   150 mg at 04/17/22 0818   ziprasidone (GEODON) capsule 40 mg  40 mg Oral BID WC Sahira Cataldi, Madie Reno, MD   40 mg at 04/17/22 8182    Lab Results:  Results for orders placed or performed during the hospital encounter of 04/15/22 (from the past 48 hour(s))  Hemoglobin A1c     Status: Abnormal   Collection Time: 04/15/22 12:45 PM  Result Value Ref Range   Hgb A1c MFr Bld 5.8 (H) 4.8 - 5.6 %    Comment: (NOTE) Pre diabetes:          5.7%-6.4%  Diabetes:              >6.4%  Glycemic control for   <7.0% adults with diabetes    Mean Plasma Glucose 119.76 mg/dL    Comment: Performed at [facility], [place] [address] [place], [place] [address]  Lipid panel     Status: Abnormal   Collection Time: 04/15/22 12:45 PM  Result Value Ref Range   Cholesterol 154 0 - 200 mg/dL   Triglycerides 259 (H) <150 mg/dL   HDL 27 (L) >40 mg/dL   Total CHOL/HDL Ratio 5.7 RATIO   VLDL 52 (H) 0 - 40 mg/dL   LDL Cholesterol 75 0 - 99 mg/dL    Comment:        Total Cholesterol/HDL:CHD Risk Coronary Heart Disease Risk Table                     Men  Women  1/2 Average Risk   3.4   3.3  Average Risk       5.0   4.4  2 X Average Risk   9.6   7.1  3 X Average Risk  23.4   11.0        Use the calculated Patient Ratio above and the CHD Risk Table to determine the patient's CHD Risk.        ATP III CLASSIFICATION (LDL):  <100     mg/dL   Optimal  100-129  mg/dL   Near or Above                    Optimal  130-159  mg/dL   Borderline  160-189  mg/dL    High  >190     mg/dL   Very High Performed at [facility], [address] [place], [place] [address]     Blood Alcohol level:  Lab Results  Component Value Date   [facility] <10 04/14/2022   ETH <10 41/96/2229    Metabolic Disorder Labs: Lab Results  Component Value Date   HGBA1C 5.8 (H) 04/15/2022   MPG 119.76 04/15/2022   MPG 134.11 07/31/2020   No results found for: "PROLACTIN" Lab Results  Component Value Date   CHOL 154 04/15/2022   TRIG 259 (H) 04/15/2022   HDL 27 (L) 04/15/2022   CHOLHDL 5.7 04/15/2022   VLDL 52 (H) 04/15/2022   LDLCALC 75 04/15/2022   LDLCALC 106 (H) 11/05/2017    Physical Findings: AIMS:  , ,  ,  ,    CIWA:    COWS:     Musculoskeletal: Strength & Muscle Tone: within normal limits Gait & Station: normal Patient leans: N/A  Psychiatric Specialty Exam:  Presentation  General Appearance:  Casual  Eye Contact: Good  Speech: Clear and Coherent  Speech Volume: Normal  Handedness:No data recorded  Mood and Affect  Mood: Depressed  Affect: Congruent   Thought Process  Thought Processes: Coherent  Descriptions of Associations:Intact  Orientation:Full (Time, Place and Person)  Thought Content:WDL  History of Schizophrenia/Schizoaffective disorder:Yes  Duration of Psychotic Symptoms:No data recorded Hallucinations:No data recorded Ideas of Reference:None  Suicidal Thoughts:No data recorded Homicidal Thoughts:No data recorded  Sensorium  Memory: Immediate Fair  Judgment: Poor  Insight: Fair   Materials engineer: Fair  Attention Span: Fair  Recall: [employer] of Knowledge: Fair  Language: Fair   Psychomotor Activity  Psychomotor Activity:No data recorded  Assets  Assets: Desire for Improvement; Housing; Resilience   Sleep  Sleep:No data recorded   Physical Exam: Physical Exam Vitals and nursing note reviewed.  Constitutional:      Appearance: Normal  appearance.  HENT:     Head: Normocephalic and atraumatic.     Mouth/Throat:     Pharynx: Oropharynx is clear.  Eyes:     Pupils: Pupils are equal, round, and reactive to light.  Cardiovascular:     Rate and Rhythm: Normal rate and regular rhythm.  Pulmonary:     Effort: Pulmonary effort is normal.     Breath sounds: Normal breath sounds.  Abdominal:     General: Abdomen is flat.     Palpations: Abdomen is soft.  Musculoskeletal:        General: Normal range of motion.  Skin:    General: Skin is warm and dry.  Neurological:     General: No focal deficit present.     Mental Status:  He is alert. Mental status is at baseline.  Psychiatric:        Attention and Perception: Attention normal.        Mood and Affect: Mood is anxious. Affect is blunt.        Speech: Speech is delayed.        Behavior: Behavior is slowed.        Thought Content: Thought content normal.        Cognition and Memory: Cognition is impaired.    Review of Systems  Constitutional: Negative.   HENT: Negative.    Eyes: Negative.   Respiratory: Negative.    Cardiovascular: Negative.   Gastrointestinal: Negative.   Musculoskeletal: Negative.   Skin: Negative.   Neurological: Negative.   Psychiatric/Behavioral:  The patient is nervous/anxious.    Blood pressure 133/81, pulse (!) 57, temperature 97.8 F (36.6 C), resp. rate 18, SpO2 100 %. There is no height or weight on file to calculate BMI.   Treatment Plan Summary: Medication management and Plan I reviewed his medicine and given the dose of Valium that he already has and his other medicines I am not going to increase his standing "nerve" medicine but will provide as needed Atarax.  Supportive counseling encourage patient to be out of bed and interact with others.  We will anticipate PT and OT probably done sometime today.  Patient may need stabilization through the weekend.  Alethia Berthold, MD 04/17/2022, 11:13 AM
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
## 2022-04-17 NOTE — BH Assessment (Signed)
1905 Received patient sitting in the day room watching TV. His affect is flat but he is out in the day room. No initiation of conversation with any of the other patients.  2130 Patient reports that he is worried because his brother told him that he must find a housekeeper and a nurse aid  to help him at his home with care and cleaning. Patient reports that he is on medicaid and he does not know how he will afford the extra cost. Patient informed that he should report his concerns to his Education officer, museum and this Probation officer will also pass it on in report.  0100 Patient is resting quietly in bed since 2330. Will continue to monitor patient for safety.  0400 Patient up to the rest room several times during the night.   0630 He was steady on his feet and using his walker appropriately. Will continue to monitor patient for safety.

## 2022-04-17 NOTE — Evaluation (Signed)
Physical Therapy Evaluation Patient Details Name: Danny Cook. MRN: [id number] DOB: 1961-05-27 Today's Date: 04/17/2022  History of Present Illness  Pt is a 61 y.o. male presenting to ED 10/16 reporting suicidal thoughts (overdosing); recent nausea/vomiting; admitted to geropsychiatric unit.  PMH includes anxiety, CHF, CKD, HIV, htn, MI, schizophrenia paranoid disorder, and stroke.  Clinical Impression  Prior to hospital admission, pt was modified independent ambulating with rollator; 1 fall in past 6 months when pt slipped in den (was barefoot); lives alone in 1 level apt (no STE); pt reports difficulty performing tasks/ADL's at home d/t back pain.  Pt report having chronic back pain but has no pain in sitting (pt reporting back pain increased with standing and walking but felt better if he kept in flexed forward posture). Currently pt is modified independent with transfers and SBA with ambulation using RW (pt in flexed forward posture).  Trialed RW in lower position to improve UE positioning (so elbows not as flexed) but pt in more flexed trunk posture so walker height elevated back to previous height.  Pt would benefit from skilled PT to address noted impairments and functional limitations (see below for any additional details).  Upon hospital discharge, pt would benefit from [place].    Recommendations for follow up therapy are one component of a multi-disciplinary discharge planning process, led by the attending physician.  Recommendations may be updated based on patient status, additional functional criteria and insurance authorization.  Follow Up Recommendations Home health PT      Assistance Recommended at Discharge PRN  Patient can return home with the following  A little help with walking and/or transfers;A little help with bathing/dressing/bathroom;Assistance with cooking/housework;Assist for transportation    Equipment Recommendations Rolling walker (2 wheels)   Recommendations for Other Services       Functional Status Assessment Patient has had a recent decline in their functional status and/or demonstrates limited ability to make significant improvements in function in a reasonable and predictable amount of time     Precautions / Restrictions Precautions Precautions: Fall Restrictions Weight Bearing Restrictions: No      Mobility  Bed Mobility               General bed mobility comments: Deferred (pt sitting on edge of bed beginning of session and in chair in dayroom end of session)    Transfers Overall transfer level: Modified independent Equipment used: Rolling walker (2 wheels)               General transfer comment: fairly strong stand up to RW x3 trials from bed and x2 trials from chair (no armrests)    Ambulation/Gait Ambulation/Gait assistance: Supervision Gait Distance (Feet):  (20 feet; 40 feet; 30 feet)     Gait velocity: decreased     General Gait Details: more shuffling type gait (decreased B LE step length/foot clearance/heelstrike) with forward flexed posture (pt reports d/t chronic back pain)  Stairs            Wheelchair Mobility    Modified Rankin (Stroke Patients Only)       Balance Overall balance assessment: Needs assistance Sitting-balance support: No upper extremity supported, Feet supported Sitting balance-Leahy Scale: Good Sitting balance - Comments: steady sitting reaching within BOS   Standing balance support: No upper extremity supported Standing balance-Leahy Scale: Fair Standing balance comment: steady static standing  Pertinent Vitals/Pain Pain Assessment Pain Assessment: 0-10 Pain Score: 2  Pain Location: chronic low back pain Pain Descriptors / Indicators: Aching Pain Intervention(s): Limited activity within patient's tolerance, Monitored during session, Repositioned Vitals (HR and O2 on room air) stable and WFL  throughout treatment session.    Home Living Family/patient expects to be discharged to:: Private residence Living Arrangements: Alone   Type of Home: Apartment Home Access: Level entry       Home Layout: One [place]: [place] (4 wheels);Shower seat;Grab bars - tub/shower;Grab bars - toilet      Prior Function Prior Level of Function : Independent/Modified Independent             Mobility Comments: Modified independent ambulating with rollator; 1 fall in past 6 months (slipped when barefoot in his den)       Hand Dominance        Extremity/Trunk Assessment   Upper Extremity Assessment Upper Extremity Assessment: Generalized weakness    Lower Extremity Assessment Lower Extremity Assessment: Generalized weakness (B hip flexion, knee flexion/extension and DF 4/5)    Cervical / Trunk Assessment Cervical / Trunk Assessment: Kyphotic  Communication   Communication: No difficulties  Cognition Arousal/Alertness: Awake/alert Behavior During Therapy: Flat affect Overall Cognitive Status: Within Functional Limits for tasks assessed                                 General Comments: Slower to respond        General Comments  Nursing cleared pt for participation in physical therapy and reports pt has been ambulating independently with RW use.  Pt agreeable to PT session.  Walker caps placed on pt's walker (d/t rubber caps on back walker legs making noise and not sliding smoothly on floor--pt reporting improved walker use and appeared steady and safe with this adjustment).     Exercises     Assessment/Plan    PT Assessment Patient needs continued PT services  PT Problem List Decreased strength;Decreased activity tolerance;Decreased balance;Decreased mobility;Pain       PT Treatment Interventions DME instruction;Gait training;Functional mobility training;Therapeutic activities;Therapeutic exercise;Balance training;Patient/family education     PT Goals (Current goals can be found in the Care Plan section)  Acute Rehab PT Goals Patient Stated Goal: to improve back pain and ability to perform tasks at home PT Goal Formulation: With patient Time For Goal Achievement: 05/01/22 Potential to Achieve Goals: Fair    Frequency Min 2X/week     Co-evaluation               AM-PAC PT "6 Clicks" Mobility  Outcome Measure Help needed turning from your back to your side while in a flat bed without using bedrails?: None Help needed moving from lying on your back to sitting on the side of a flat bed without using bedrails?: None Help needed moving to and from a bed to a chair (including a wheelchair)?: None Help needed standing up from a chair using your arms (e.g., wheelchair or bedside chair)?: None Help needed to walk in hospital room?: A Little Help needed climbing 3-5 steps with a railing? : A Little 6 Click Score: 22    End of Session Equipment Utilized During Treatment: Gait belt Activity Tolerance: Patient tolerated treatment well Patient left: in chair;with nursing/sitter in room (in dayroom eating his lunch) Nurse Communication: Mobility status;Precautions PT Visit Diagnosis: Other abnormalities of gait and mobility (R26.89);Muscle weakness (generalized) (M62.81);History  of falling (Z91.81)    Time: 6728-9791 PT Time Calculation (min) (ACUTE ONLY): 14 min   Charges:   PT Evaluation $PT Eval Low Complexity: 1 Low         Damonique Brunelle, PT 04/17/22, 12:14 PM

## 2022-04-17 NOTE — Progress Notes (Signed)
Patient denies SI, HI, and AVH. He says he is feeling pretty good this morning. He denies pain and other physical problems. Patient says he is just worried about getting help at home upon discharge. Patient is compliant with scheduled medications. Patient educated on medications and verbalized understanding. Patient remains safe on the unit at this time.

## 2022-04-17 NOTE — Progress Notes (Signed)
Patient is alert and oriented times 4. Mood and affect appropriate. Patient rates pain as 4/10. He denies SI, HI, and AVH. Patient does endorse feelings of anxiety at this time. Patient states he slept well last night. Evening medicines administered whole by mouth without difficulty. Patient ate snack in day room; appetite was fair. Patient remains on unit with Q15 minute checks in place.  Patient is diabetic and wears a [employer], continuous glucose monitoring device, to check his blood sugar.  Unfortunately, he has no way of scanning the device in this unit since he does not have the monitor he uses to scan the device with him.  This RN did have the tech do a fingerstick blood sugar check on the patient and his CBG was 121.

## 2022-04-18 NOTE — Plan of Care (Signed)

## 2022-04-18 NOTE — BHH Counselor (Addendum)
[name] spoke with patient's brother, brother reports concerns for the patient being able to return home due to patient's reports that he can not care for himself.  Per brother pt reported difficulty taking his medications (remembering), bathing, cooking and other ADL's.  He reports that patient told him that he wanted to kill himself due having so much pain.  [name] updated that both PT and OT have evaluated the patient and made recommendations for Home Health PT/OT as well as a rolling walker.  Brother inquired about an aide coming in to assist and [name] updated that she will have to follow up with medical/psych team and call brother back.  [facility], brother, [place], MSW, LCSW 04/18/2022 12:50 PM

## 2022-04-18 NOTE — Plan of Care (Signed)
  Problem: Safety: Goal: Ability to remain free from injury will improve Outcome: Progressing   

## 2022-04-18 NOTE — Progress Notes (Signed)
Patient is alert and oriented times 4. Mood and affect appropriate. Patient rates pain as 3/10. He denies SI, HI, and AVH. Patient does endorse feelings of anxiety at this time. Patient states he slept well last night. Evening medicines administered whole by mouth without difficulty. Patient ate snack in day room; appetite was fair. Patient remains on unit with Q15 minute checks in place.

## 2022-04-18 NOTE — Progress Notes (Signed)
Patient is alert and oriented x 4.  Patient with flat, sad affect.  Denies SI/HI and AVH.  Endorses "a little" anxiety and depression.  Patient reports he slept well.  Good appetite this morning.  Compliant with scheduled medications.  PRN medication given for anxiety.    15 min checks in place. Patient is safe on the unit. Minimal interaction with peers in the milieu.

## 2022-04-18 NOTE — Progress Notes (Signed)
[facility] MD Progress Note  04/18/2022 4:42 PM Danny Cook.  MRN:  [id number] Subjective: Patient seen and chart reviewed.  Patient reports his mood is good.  Denies being depressed.  Denies excessive anxiety.  No new physical complaints. Principal Problem: Severe recurrent major depression with psychotic features ([place]) Diagnosis: Principal Problem:   Severe recurrent major depression with psychotic features ([place]) Active Problems:   Chronic diastolic heart failure (HCC)   HTN (hypertension)   Diabetes ([place])   HIV positive ([place])  Total Time spent with patient: 30 minutes  Past Psychiatric History: Past history of chronic mental health problems mostly with depression and also chronic medical illness  Past Medical History:  Past Medical History:  Diagnosis Date   Anemia    Anxiety    Arthritis    CHF (congestive heart failure) ([place])    Chronic kidney disease    Renal Insufficiency Syndrome; Glomerulosclerosis 2013   Depressed    Diabetes mellitus without complication (HCC)    GERD (gastroesophageal reflux disease)    Headache    High cholesterol    History of kidney stones    HIV (human immunodeficiency virus infection) ([place])    Hypertension    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[place])    Myocardial infarction ([place])    Paranoid disorder ([place])    Pneumonia    Schizophrenia, paranoid ([place])    Sleep apnea    Stroke ([place])    mini TIA   TIA (transient ischemic attack)     Past Surgical History:  Procedure Laterality Date   CARDIAC CATHETERIZATION     CATARACT EXTRACTION W/PHACO Right 04/25/2021   Procedure: CATARACT EXTRACTION PHACO AND INTRAOCULAR LENS PLACEMENT ([place]) RIGHT DIABETIC;  Surgeon: Birder Robson, MD;  Location: [facility] ORS;  Service: Ophthalmology;  Laterality: Right;  3.32 0:23.0   CATARACT EXTRACTION W/PHACO Left 09/19/2021   Procedure: CATARACT EXTRACTION PHACO AND INTRAOCULAR LENS PLACEMENT ([place]) LEFT DIABETIC;  Surgeon: Birder Robson, MD;  Location: [facility] ORS;   Service: Ophthalmology;  Laterality: Left;  6.73 0:49.1   COLONOSCOPY WITH PROPOFOL N/A 09/03/2015   Procedure: COLONOSCOPY WITH PROPOFOL;  Surgeon: Lollie Sails, MD;  Location: [facility] ENDOSCOPY;  Service: Endoscopy;  Laterality: N/A;   COLONOSCOPY WITH PROPOFOL N/A 09/17/2020   Procedure: COLONOSCOPY WITH PROPOFOL;  Surgeon: Lesly Rubenstein, MD;  Location: [facility] ENDOSCOPY;  Service: Endoscopy;  Laterality: N/A;  PREFERS 9AM OR LATER DUE TO TRANSPORTATION   DG TEETH FULL     ESOPHAGOGASTRODUODENOSCOPY (EGD) WITH PROPOFOL N/A 07/01/2016   Dr. [name], [facility]. abnormal esophageal motility, suspicious for presbyesophagus, bile gastritis s/p biopsy, non-bleeding erosive gastropathy s/p biopsy, normal duodenum, path with negative H.pylori and +chronic active gastritis   ESOPHAGOGASTRODUODENOSCOPY (EGD) WITH PROPOFOL N/A 09/17/2020   Procedure: ESOPHAGOGASTRODUODENOSCOPY (EGD) WITH PROPOFOL;  Surgeon: Lesly Rubenstein, MD;  Location: [facility] ENDOSCOPY;  Service: Endoscopy;  Laterality: N/A;   EXTRACORPOREAL SHOCK WAVE LITHOTRIPSY Right 11/01/2020   Procedure: EXTRACORPOREAL SHOCK WAVE LITHOTRIPSY (ESWL);  Surgeon: Abbie Sons, MD;  Location: [facility] ORS;  Service: Urology;  Laterality: Right;   LUMBAR LAMINECTOMY/DECOMPRESSION MICRODISCECTOMY N/A 07/15/2021   Procedure: L3-5 DECOMPRESSION;  Surgeon: Meade Maw, MD;  Location: [facility] ORS;  Service: Neurosurgery;  Laterality: N/A;   Family History:  Family History  Problem Relation Age of Onset   Heart attack Mother    Diabetes Mellitus II Mother    Mental illness Mother    CAD Mother    Heart attack Father    CAD Father  Hypertension Father    Family Psychiatric  History: See previous Social History:  Social History   Substance and Sexual Activity  Alcohol Use No     Social History   Substance and Sexual Activity  Drug Use No   Comment: denies street drugs    Social History   Socioeconomic History   Marital status:  Single    Spouse name: Not on file   Number of children: Not on file   Years of education: 11   Highest education level: GED or equivalent  Occupational History   Not on file  Tobacco Use   Smoking status: Never   Smokeless tobacco: Never  Vaping Use   Vaping Use: Never used  Substance and Sexual Activity   Alcohol use: No   Drug use: No    Comment: denies street drugs   Sexual activity: Never  Other Topics Concern   Not on file  Social History Narrative   Not on file   Social Determinants of Health   Financial Resource Strain: Low Risk  (08/12/2017)   Overall Financial Resource Strain (CARDIA)    Difficulty of Paying Living Expenses: Not hard at all  Food Insecurity: Food Insecurity Present (04/15/2022)   Hunger Vital Sign    Worried About [place] in the Last Year: Sometimes true    Ran Out of Food in the Last Year: Sometimes true  Transportation Needs: Unmet Transportation Needs (04/15/2022)   PRAPARE - Hydrologist (Medical): Yes    Lack of Transportation (Non-Medical): Yes  Physical Activity: Inactive (08/12/2017)   Exercise Vital Sign    Days of Exercise per Week: 0 days    Minutes of Exercise per Session: 0 min  Stress: Stress Concern Present (08/12/2017)   [place]    Feeling of Stress : To some extent  Social Connections: Moderately Isolated (08/12/2017)   Social Connection and Isolation Panel [NHANES]    Frequency of Communication with Friends and Family: More than three times a week    Frequency of Social Gatherings with Friends and Family: Three times a week    Attends Religious Services: Never    Active Member of Clubs or Organizations: No    Attends Archivist Meetings: Never    Marital Status: Never married   Additional Social History:                         Sleep: Fair  Appetite:  Fair  Current Medications: Current  Facility-Administered Medications  Medication Dose Route Frequency Provider Last Rate Last Admin   abacavir-dolutegravir-lamiVUDine (TRIUMEQ) 600-50-300 MG per tablet 1 tablet  1 tablet Oral QHS Caroline Sauger, NP   1 tablet at 04/17/22 2117   acetaminophen (TYLENOL) tablet 650 mg  650 mg Oral Q6H PRN Caroline Sauger, NP       albuterol (VENTOLIN HFA) 108 (90 Base) MCG/ACT inhaler 2 puff  2 puff Inhalation Q6H PRN Caroline Sauger, NP       alum & mag hydroxide-simeth (MAALOX/MYLANTA) 200-200-20 MG/5ML suspension 30 mL  30 mL Oral Q4H PRN Caroline Sauger, NP       aspirin tablet 325 mg  325 mg Oral Daily Caroline Sauger, NP   325 mg at 04/18/22 0809   diazepam (VALIUM) tablet 5 mg  5 mg Oral TID Alyssa Mancera, Madie Reno, MD   5 mg at 04/18/22 0809   dicyclomine (BENTYL)  capsule 10 mg  10 mg Oral TID AC & HS Caroline Sauger, NP   10 mg at 04/18/22 1237   divalproex (DEPAKOTE ER) 24 hr tablet 500 mg  500 mg Oral QHS Caroline Sauger, NP   500 mg at 04/17/22 2117   donepezil (ARICEPT) tablet 5 mg  5 mg Oral Daily Caroline Sauger, NP   5 mg at 04/18/22 0809   fluticasone (FLOVENT HFA) 220 MCG/ACT inhaler 2 puff  2 puff Inhalation BID Caroline Sauger, NP   2 puff at 04/18/22 0737   gabapentin (NEURONTIN) capsule 800 mg  800 mg Oral TID Caroline Sauger, NP   800 mg at 04/18/22 1062   hydrOXYzine (ATARAX) tablet 25 mg  25 mg Oral TID PRN Albertia Carvin, Madie Reno, MD   25 mg at 04/18/22 6948   losartan (COZAAR) tablet 25 mg  25 mg Oral Daily Caroline Sauger, NP   25 mg at 04/18/22 0809   magnesium hydroxide (MILK OF MAGNESIA) suspension 30 mL  30 mL Oral Daily PRN Caroline Sauger, NP   30 mL at 04/16/22 1139   memantine (NAMENDA) tablet 10 mg  10 mg Oral BID Caroline Sauger, NP   10 mg at 04/18/22 5462   metFORMIN (GLUCOPHAGE) tablet 1,000 mg  1,000 mg Oral BID WC Caroline Sauger, NP   1,000 mg at 04/18/22 0809   mirtazapine (REMERON) tablet 45 mg  45 mg  Oral QHS Darcel Frane T, MD   45 mg at 04/17/22 2116   venlafaxine XR (EFFEXOR-XR) 24 hr capsule 150 mg  150 mg Oral Q breakfast Kaleah Hagemeister T, MD   150 mg at 04/18/22 7035   ziprasidone (GEODON) capsule 40 mg  40 mg Oral BID WC Elandra Powell, Madie Reno, MD   40 mg at 04/18/22 0093    Lab Results:  Results for orders placed or performed during the hospital encounter of 04/15/22 (from the past 48 hour(s))  Glucose, capillary     Status: Abnormal   Collection Time: 04/17/22  8:09 PM  Result Value Ref Range   Glucose-Capillary 121 (H) 70 - 99 mg/dL    Comment: Glucose reference range applies only to samples taken after fasting for at least 8 hours.   Comment 1 Notify RN     Blood Alcohol level:  Lab Results  Component Value Date   ETH <10 04/14/2022   ETH <10 81/82/9937    Metabolic Disorder Labs: Lab Results  Component Value Date   HGBA1C 5.8 (H) 04/15/2022   MPG 119.76 04/15/2022   MPG 134.11 07/31/2020   No results found for: "PROLACTIN" Lab Results  Component Value Date   CHOL 154 04/15/2022   TRIG 259 (H) 04/15/2022   HDL 27 (L) 04/15/2022   CHOLHDL 5.7 04/15/2022   VLDL 52 (H) 04/15/2022   LDLCALC 75 04/15/2022   LDLCALC 106 (H) 11/05/2017    Physical Findings: AIMS:  , ,  ,  ,    CIWA:    COWS:     Musculoskeletal: Strength & Muscle Tone: within normal limits Gait & Station: normal Patient leans: N/A  Psychiatric Specialty Exam:  Presentation  General Appearance:  Casual  Eye Contact: Good  Speech: Clear and Coherent  Speech Volume: Normal  Handedness:No data recorded  Mood and Affect  Mood: Depressed  Affect: Congruent   Thought Process  Thought Processes: Coherent  Descriptions of Associations:Intact  Orientation:Full (Time, Place and Person)  Thought Content:WDL  History of Schizophrenia/Schizoaffective disorder:Yes  Duration of Psychotic Symptoms:No data  recorded Hallucinations:No data recorded Ideas of  Reference:None  Suicidal Thoughts:No data recorded Homicidal Thoughts:No data recorded  Sensorium  Memory: Immediate Fair  Judgment: Poor  Insight: Fair   Materials engineer: Fair  Attention Span: Fair  Recall: [employer] of Knowledge: Fair  Language: Fair   Psychomotor Activity  Psychomotor Activity:No data recorded  Assets  Assets: Desire for Improvement; Housing; Resilience   Sleep  Sleep:No data recorded   Physical Exam: Physical Exam Vitals and nursing note reviewed.  Constitutional:      Appearance: Normal appearance.  HENT:     Head: Normocephalic and atraumatic.     Mouth/Throat:     Pharynx: Oropharynx is clear.  Eyes:     Pupils: Pupils are equal, round, and reactive to light.  Cardiovascular:     Rate and Rhythm: Normal rate and regular rhythm.  Pulmonary:     Effort: Pulmonary effort is normal.     Breath sounds: Normal breath sounds.  Abdominal:     General: Abdomen is flat.     Palpations: Abdomen is soft.  Musculoskeletal:        General: Normal range of motion.  Skin:    General: Skin is warm and dry.  Neurological:     General: No focal deficit present.     Mental Status: He is alert. Mental status is at baseline.  Psychiatric:        Attention and Perception: Attention normal.        Mood and Affect: Mood normal.        Speech: Speech normal.        Behavior: Behavior normal.        Thought Content: Thought content normal.        Cognition and Memory: Cognition normal.        Judgment: Judgment normal.    Review of Systems  Constitutional: Negative.   HENT: Negative.    Eyes: Negative.   Respiratory: Negative.    Cardiovascular: Negative.   Gastrointestinal: Negative.   Musculoskeletal: Negative.   Skin: Negative.   Neurological: Negative.   Psychiatric/Behavioral: Negative.     Blood pressure 128/78, pulse 61, temperature 97.8 F (36.6 C), temperature source Oral, resp. rate 16, SpO2 98 %.  There is no height or weight on file to calculate BMI.   Treatment Plan Summary: Medication management and Plan no change to medication management.  Supportive counseling and therapy.  Encourage continued group interaction.  Noted that PT and OT are supportive of home health treatment team will continue to work on that.  Alethia Berthold, MD 04/18/2022, 4:42 PM

## 2022-04-18 NOTE — Group Note (Signed)
LCSW Group Therapy Note  Group Date: 04/18/2022 Start Time: 1300 End Time: 1400   Type of Therapy and Topic:  Group Therapy - How To Cope with Nervousness about Discharge   Participation Level:  Did Not Attend   Description of Group This process group involved identification of patients' feelings about discharge. Some of them are scheduled to be discharged soon, while others are new admissions, but each of them was asked to share thoughts and feelings surrounding discharge from the hospital. One common theme was that they are excited at the prospect of going home, while another was that many of them are apprehensive about sharing why they were hospitalized. Patients were given the opportunity to discuss these feelings with their peers in preparation for discharge.  Therapeutic Goals  Patient will identify their overall feelings about pending discharge. Patient will think about how they might proactively address issues that they believe will once again arise once they get home (i.e. with parents). Patients will participate in discussion about having hope for change.   Summary of Patient Progress:   Patient was asleep at the beginning of group.   Therapeutic Modalities Cognitive Behavioral Therapy   Maryjane Hurter 04/18/2022  1:57 PM

## 2022-04-18 NOTE — Progress Notes (Signed)
Occupational Therapy Treatment Patient Details Name: Danny Cook. MRN: [id number] DOB: Jul 25, 1960 Today's Date: 04/18/2022   History of present illness Pt is a 61 y.o. male presenting to ED 10/16 reporting suicidal thoughts (overdosing); recent nausea/vomiting; admitted to geropsychiatric unit.  PMH includes anxiety, CHF, CKD, HIV, htn, MI, schizophrenia paranoid disorder, and stroke.   OT comments  Danny Cook displayed excellent progress today. He recalled information exchanged during yesterday's OT session and, as requested by therapist, had taken steps to address some of his concerns. Prior to admission, pt had encountered difficulties with medication management, when his prescriptions were switched from [facility] -- which prepared pills in blister packets marked by time of day pills were to be taken -- to [facility], which provided pills only in bottles. Today, pt had already met with a peer support counselor and had made arrangements for his prescriptions to be filled at [facility], in blister packets. Pt and therapist discussed what pt should do if his medications ran out, with pt able to provide adequate responses -- something he had been unable to do the day before. Pt had met today with his case manager and had made arrangements to participate in a peer support day program on M,W,F and to go to a senior center for bingo on Thursdays. Danny Cook reports that he continues to feel his anxiety and depression are slightly lessened today. Discussed strategies for dealing with neighbor that has troubled pt, with pt able generate several good coping strategies. Recommend HHOT post DC.    Recommendations for follow up therapy are one component of a multi-disciplinary discharge planning process, led by the attending physician.  Recommendations may be updated based on patient status, additional functional criteria and insurance authorization.    Follow Up  Recommendations  Home health OT    Assistance Recommended at Discharge Intermittent Supervision/Assistance  Patient can return home with the following  A little help with bathing/dressing/bathroom;Direct supervision/assist for medications management;Assist for transportation;Assistance with cooking/housework   Equipment Recommendations  None recommended by OT    Recommendations for Other Services      Precautions / Restrictions Precautions Precautions: None Restrictions Weight Bearing Restrictions: No       Mobility Bed Mobility Overal bed mobility: Modified Independent             General bed mobility comments: Pt able to perform sit<>stand easily and w/o assistance from low bed    Transfers Overall transfer level: Modified independent Equipment used: Rolling walker (2 wheels)               General transfer comment: Pt able to stand with Mod I from chairs w/ and w/o armrest and from bed in very low position     Balance Overall balance assessment: Needs assistance Sitting-balance support: No upper extremity supported, Feet supported Sitting balance-Leahy Scale: Normal     Standing balance support: Bilateral upper extremity supported Standing balance-Leahy Scale: Good Standing balance comment: no LOB while ambulating with RW at normal pace, kyphotic posture; pt endorses lower back pain if he stands w/o RW or if he raises head                           ADL either performed or assessed with clinical judgement   ADL  Extremity/Trunk Assessment Upper Extremity Assessment Upper Extremity Assessment: Overall WFL for tasks assessed   Lower Extremity Assessment Lower Extremity Assessment: Overall WFL for tasks assessed   Cervical / Trunk Assessment Cervical / Trunk Assessment: Kyphotic    Vision       Perception     Praxis      Cognition Arousal/Alertness:  Awake/alert Behavior During Therapy: WFL for tasks assessed/performed Overall Cognitive Status: Within Functional Limits for tasks assessed                                          Exercises Other Exercises Other Exercises: Educ re: medication mgmt, stress/anxiety mgmt techniques, engagement in meaningful activities, social support    Shoulder Instructions       General Comments      Pertinent Vitals/ Pain       Pain Assessment Pain Score: 2  Pain Location: chronic low back pain Pain Descriptors / Indicators: Aching, Tightness Pain Intervention(s): Repositioned, Utilized relaxation techniques, Limited activity within patient's tolerance  Home Living                                          Prior Functioning/Environment              Frequency  Min 2X/week        Progress Toward Goals  OT Goals(current goals can now be found in the care plan section)  Progress towards OT goals: Progressing toward goals  Acute Rehab OT Goals OT Goal Formulation: With patient Time For Goal Achievement: 05/01/22 Potential to Achieve Goals: Good  Plan Discharge plan remains appropriate;Frequency remains appropriate    Co-evaluation                 AM-PAC OT "6 Clicks" Daily Activity     Outcome Measure   Help from another person eating meals?: None Help from another person taking care of personal grooming?: None Help from another person toileting, which includes using toliet, bedpan, or urinal?: None Help from another person bathing (including washing, rinsing, drying)?: A Little Help from another person to put on and taking off regular upper body clothing?: None Help from another person to put on and taking off regular lower body clothing?: None 6 Click Score: 23    End of Session Equipment Utilized During Treatment: Rolling walker (2 wheels)  OT Visit Diagnosis: Unsteadiness on feet (R26.81);Muscle weakness (generalized)  (M62.81)   Activity Tolerance Patient tolerated treatment well   Patient Left in chair   Nurse Communication          Time: 1388-7195 OT Time Calculation (min): 17 min  Charges: OT General Charges $OT Visit: 1 Visit OT Treatments $Self Care/Home Management : 8-22 mins Josiah Lobo, PhD, MS, OTR/L 04/18/22, 3:14 PM

## 2022-04-19 NOTE — Progress Notes (Signed)
Patient alert and oriented x 4.  Patient with blunted affect.  Patient reports he woke up throughout the night.  Denies SI/HI and AVH.  Endorses mild anxiety and depression.  Pain 3/10 (back).   15 min checks in place.  Patient is safe on the unit.  Compliant with scheduled medications.  Patient is present in milieu. Appropriate interaction with peers.

## 2022-04-19 NOTE — Plan of Care (Signed)
  Problem: Nutrition: Goal: Adequate nutrition will be maintained Outcome: Progressing   Problem: Coping: Goal: Level of anxiety will decrease Outcome: Progressing   Problem: Pain Managment: Goal: General experience of comfort will improve Outcome: Progressing   Problem: Skin Integrity: Goal: Risk for impaired skin integrity will decrease Outcome: Progressing   

## 2022-04-19 NOTE — Progress Notes (Signed)
[facility] MD Progress Note  04/19/2022 12:05 PM Danny Cook.  MRN:  [id number] Subjective:  Patient is mild-mannered this morning.  States that his depression is better.  Denies psychosis.  Denies any mood anger.  Appetite is improved.  He is taking his medications.  Reports some lower back pain.  No falls.  Tells me that he slept fair, woke up several times. Principal Problem: Severe recurrent major depression with psychotic features ([place]) Diagnosis: Principal Problem:   Severe recurrent major depression with psychotic features ([place]) Active Problems:   Chronic diastolic heart failure (HCC)   HTN (hypertension)   Diabetes ([place])   HIV positive ([place])  Total Time spent with patient: 27 minutes  Past Psychiatric History: Past history of chronic mental health problems mostly with depression and also chronic medical illness  Past Medical History:  Past Medical History:  Diagnosis Date   Anemia    Anxiety    Arthritis    CHF (congestive heart failure) ([place])    Chronic kidney disease    Renal Insufficiency Syndrome; Glomerulosclerosis 2013   Depressed    Diabetes mellitus without complication (HCC)    GERD (gastroesophageal reflux disease)    Headache    High cholesterol    History of kidney stones    HIV (human immunodeficiency virus infection) ([place])    Hypertension    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) RIGHT DIABETIC;  Surgeon: Birder Robson, MD;  Location: [facility] ORS;  Service: Ophthalmology;  Laterality: Right;  3.32 0:23.0   CATARACT EXTRACTION W/PHACO Left 09/19/2021   Procedure: CATARACT  EXTRACTION PHACO AND INTRAOCULAR LENS PLACEMENT ([place]) LEFT DIABETIC;  Surgeon: Birder Robson, MD;  Location: [facility] ORS;  Service: Ophthalmology;  Laterality: Left;  6.73 0:49.1   COLONOSCOPY WITH PROPOFOL N/A 09/03/2015   Procedure: COLONOSCOPY WITH PROPOFOL;  Surgeon: Lollie Sails, MD;  Location: [facility] ENDOSCOPY;  Service: Endoscopy;  Laterality: N/A;   COLONOSCOPY WITH PROPOFOL N/A 09/17/2020   Procedure: COLONOSCOPY WITH PROPOFOL;  Surgeon: Lesly Rubenstein, MD;  Location: [facility] ENDOSCOPY;  Service: Endoscopy;  Laterality: N/A;  PREFERS 9AM OR LATER DUE TO TRANSPORTATION   DG TEETH FULL     ESOPHAGOGASTRODUODENOSCOPY (EGD) WITH PROPOFOL N/A 07/01/2016   Dr. [name], [facility]. abnormal esophageal motility, suspicious for presbyesophagus, bile gastritis s/p biopsy, non-bleeding erosive gastropathy s/p biopsy, normal duodenum, path with negative H.pylori and +chronic active gastritis   ESOPHAGOGASTRODUODENOSCOPY (EGD) WITH PROPOFOL N/A 09/17/2020   Procedure: ESOPHAGOGASTRODUODENOSCOPY (EGD) WITH PROPOFOL;  Surgeon: Lesly Rubenstein, MD;  Location: [facility] ENDOSCOPY;  Service: Endoscopy;  Laterality: N/A;   EXTRACORPOREAL SHOCK WAVE LITHOTRIPSY Right 11/01/2020   Procedure: EXTRACORPOREAL SHOCK WAVE LITHOTRIPSY (ESWL);  Surgeon: Abbie Sons, MD;  Location: [facility] ORS;  Service: Urology;  Laterality: Right;   LUMBAR LAMINECTOMY/DECOMPRESSION MICRODISCECTOMY N/A 07/15/2021   Procedure: L3-5 DECOMPRESSION;  Surgeon: Meade Maw, MD;  Location: [facility] ORS;  Service: Neurosurgery;  Laterality: N/A;   Family History:  Family History  Problem Relation Age of Onset   Heart attack Mother    Diabetes Mellitus II  Mother    Mental illness Mother    CAD Mother    Heart attack Father    CAD Father    Hypertension Father    Family Psychiatric  History: See previous Social History:  Social History   Substance and Sexual Activity  Alcohol Use No     Social History   Substance and  Sexual Activity  Drug Use No   Comment: denies street drugs    Social History   Socioeconomic History   Marital status: Single    Spouse name: Not on file   Number of children: Not on file   Years of education: 11   Highest education level: GED or equivalent  Occupational History   Not on file  Tobacco Use   Smoking status: Never   Smokeless tobacco: Never  Vaping Use   Vaping Use: Never used  Substance and Sexual Activity   Alcohol use: No   Drug use: No    Comment: denies street drugs   Sexual activity: Never  Other Topics Concern   Not on file  Social History Narrative   Not on file   Social Determinants of Health   Financial Resource Strain: Low Risk  (08/12/2017)   Overall Financial Resource Strain (CARDIA)    Difficulty of Paying Living Expenses: Not hard at all  Food Insecurity: Food Insecurity Present (04/15/2022)   Hunger Vital Sign    Worried About [place] in the Last Year: Sometimes true    Ran Out of Food in the Last Year: Sometimes true  Transportation Needs: Unmet Transportation Needs (04/15/2022)   PRAPARE - Hydrologist (Medical): Yes    Lack of Transportation (Non-Medical): Yes  Physical Activity: Inactive (08/12/2017)   Exercise Vital Sign    Days of Exercise per Week: 0 days    Minutes of Exercise per Session: 0 min  Stress: Stress Concern Present (08/12/2017)   [place]    Feeling of Stress : To some extent  Social Connections: Moderately Isolated (08/12/2017)   Social Connection and Isolation Panel [NHANES]    Frequency of Communication with Friends and Family: More than three times a week    Frequency of Social Gatherings with Friends and Family: Three times a week    Attends Religious Services: Never    Active Member of Clubs or Organizations: No    Attends Archivist Meetings: Never    Marital Status: Never married    Additional Social History:                         Sleep: Fair  Appetite:  Fair  Current Medications: Current Facility-Administered Medications  Medication Dose Route Frequency Provider Last Rate Last Admin   abacavir-dolutegravir-lamiVUDine (TRIUMEQ) 600-50-300 MG per tablet 1 tablet  1 tablet Oral QHS Caroline Sauger, NP   1 tablet at 04/18/22 2114   acetaminophen (TYLENOL) tablet 650 mg  650 mg Oral Q6H PRN Caroline Sauger, NP       albuterol (VENTOLIN HFA) 108 (90 Base) MCG/ACT inhaler 2 puff  2 puff Inhalation Q6H PRN Caroline Sauger, NP       alum & mag hydroxide-simeth (MAALOX/MYLANTA) 200-200-20 MG/5ML suspension 30 mL  30 mL Oral Q4H PRN Caroline Sauger, NP       aspirin tablet 325 mg  325 mg Oral Daily Caroline Sauger, NP   325 mg at 04/19/22 [phone]  diazepam (VALIUM) tablet 5 mg  5 mg Oral TID Clapacs, John T, MD   5 mg at 04/19/22 0852   dicyclomine (BENTYL) capsule 10 mg  10 mg Oral TID AC & HS Caroline Sauger, NP   10 mg at 04/19/22 0851   divalproex (DEPAKOTE ER) 24 hr tablet 500 mg  500 mg Oral QHS Caroline Sauger, NP   500 mg at 04/18/22 2115   donepezil (ARICEPT) tablet 5 mg  5 mg Oral Daily Caroline Sauger, NP   5 mg at 04/19/22 0852   fluticasone (FLOVENT HFA) 220 MCG/ACT inhaler 2 puff  2 puff Inhalation BID Caroline Sauger, NP   2 puff at 04/19/22 0851   gabapentin (NEURONTIN) capsule 800 mg  800 mg Oral TID Caroline Sauger, NP   800 mg at 04/19/22 5427   hydrOXYzine (ATARAX) tablet 25 mg  25 mg Oral TID PRN Clapacs, Madie Reno, MD   25 mg at 04/18/22 2114   losartan (COZAAR) tablet 25 mg  25 mg Oral Daily Caroline Sauger, NP   25 mg at 04/19/22 0623   magnesium hydroxide (MILK OF MAGNESIA) suspension 30 mL  30 mL Oral Daily PRN Caroline Sauger, NP   30 mL at 04/16/22 1139   memantine (NAMENDA) tablet 10 mg  10 mg Oral BID Caroline Sauger, NP   10 mg at 04/19/22 7628   metFORMIN (GLUCOPHAGE)  tablet 1,000 mg  1,000 mg Oral BID WC Caroline Sauger, NP   1,000 mg at 04/19/22 0851   mirtazapine (REMERON) tablet 45 mg  45 mg Oral QHS Clapacs, John T, MD   45 mg at 04/18/22 2114   venlafaxine XR (EFFEXOR-XR) 24 hr capsule 150 mg  150 mg Oral Q breakfast Clapacs, John T, MD   150 mg at 04/19/22 3151   ziprasidone (GEODON) capsule 40 mg  40 mg Oral BID WC Clapacs, Madie Reno, MD   40 mg at 04/19/22 7616    Lab Results:  Results for orders placed or performed during the hospital encounter of 04/15/22 (from the past 48 hour(s))  Glucose, capillary     Status: Abnormal   Collection Time: 04/17/22  8:09 PM  Result Value Ref Range   Glucose-Capillary 121 (H) 70 - 99 mg/dL    Comment: Glucose reference range applies only to samples taken after fasting for at least 8 hours.   Comment 1 Notify RN     Blood Alcohol level:  Lab Results  Component Value Date   ETH <10 04/14/2022   ETH <10 07/37/1062    Metabolic Disorder Labs: Lab Results  Component Value Date   HGBA1C 5.8 (H) 04/15/2022   MPG 119.76 04/15/2022   MPG 134.11 07/31/2020   No results found for: "PROLACTIN" Lab Results  Component Value Date   CHOL 154 04/15/2022   TRIG 259 (H) 04/15/2022   HDL 27 (L) 04/15/2022   CHOLHDL 5.7 04/15/2022   VLDL 52 (H) 04/15/2022   LDLCALC 75 04/15/2022   LDLCALC 106 (H) 11/05/2017    Physical Findings: AIMS:  , ,  ,  ,    CIWA:    COWS:     Musculoskeletal: Strength & Muscle Tone: within normal limits Gait & Station: normal Patient leans: N/A  Psychiatric Specialty Exam:  Presentation  General Appearance:  Casual  Eye Contact: Good  Speech: Clear and Coherent  Speech Volume: Normal  Handedness:No data recorded  Mood and Affect  Mood: Depressed  Affect: Congruent   Thought Process  Thought Processes:  Coherent  Descriptions of Associations:Intact  Orientation:Full (Time, Place and Person)  Thought Content:WDL  History of  Schizophrenia/Schizoaffective disorder:Yes  Duration of Psychotic Symptoms:No data recorded Hallucinations:No data recorded Ideas of Reference:None  Suicidal Thoughts:No data recorded Homicidal Thoughts:No data recorded  Sensorium  Memory: Immediate Fair  Judgment: Poor  Insight: Fair   Community education officer  Concentration: Fair  Attention Span: Fair  Recall: [employer] of Knowledge: Fair  Language: Fair   Psychomotor Activity  Psychomotor Activity:No data recorded  Assets  Assets: Desire for Improvement; Housing; Resilience   Sleep  Sleep:No data recorded   Physical Exam: Physical Exam Vitals and nursing note reviewed.  Constitutional:      Appearance: Normal appearance.  HENT:     Head: Normocephalic and atraumatic.     Mouth/Throat:     Pharynx: Oropharynx is clear.  Eyes:     Pupils: Pupils are equal, round, and reactive to light.  Cardiovascular:     Rate and Rhythm: Normal rate and regular rhythm.  Pulmonary:     Effort: Pulmonary effort is normal.     Breath sounds: Normal breath sounds.  Abdominal:     General: Abdomen is flat.     Palpations: Abdomen is soft.  Musculoskeletal:        General: Normal range of motion.  Skin:    General: Skin is warm and dry.  Neurological:     General: No focal deficit present.     Mental Status: He is alert. Mental status is at baseline.  Psychiatric:        Attention and Perception: Attention normal.        Mood and Affect: Mood normal.        Speech: Speech normal.        Behavior: Behavior normal.        Thought Content: Thought content normal.        Cognition and Memory: Cognition normal.        Judgment: Judgment normal.    Review of Systems  Constitutional: Negative.   HENT: Negative.    Eyes: Negative.   Respiratory: Negative.    Cardiovascular: Negative.   Gastrointestinal: Negative.   Musculoskeletal: Negative.   Skin: Negative.   Neurological: Negative.    Psychiatric/Behavioral: Negative.     Blood pressure (!) 144/86, pulse (!) 57, temperature (!) 97.5 F (36.4 C), resp. rate 18, SpO2 100 %. There is no height or weight on file to calculate BMI.   Treatment Plan Summary: Medication management and Plan no change to medication management.  Supportive counseling and therapy.  Encourage continued group interaction.  Noted that PT and OT are supportive of home health treatment team will continue to work on that.  10/21 No changes  Rulon Sera, MD 04/19/2022, 12:05 PM

## 2022-04-20 MED ORDER — LIDOCAINE 5 % EX PTCH
1.0000 | MEDICATED_PATCH | CUTANEOUS | Status: DC
  Administered 2022-04-20 – 2022-04-22 (×3): 1 via TRANSDERMAL
  Filled 2022-04-20 (×3): qty 1

## 2022-04-20 MED ORDER — POLYETHYLENE GLYCOL 3350 17 G PO PACK
17.0000 g | PACK | Freq: Every day | ORAL | Status: AC
  Administered 2022-04-20 – 2022-04-22 (×3): 17 g via ORAL
  Filled 2022-04-20 (×3): qty 1

## 2022-04-20 NOTE — Plan of Care (Signed)
  Problem: Health Behavior/Discharge Planning: Goal: Ability to manage health-related needs will improve Outcome: Progressing   Problem: Activity: Goal: Risk for activity intolerance will decrease Outcome: Progressing   Problem: Nutrition: Goal: Adequate nutrition will be maintained Outcome: Progressing   Problem: Coping: Goal: Level of anxiety will decrease Outcome: Progressing   Problem: Safety: Goal: Ability to remain free from injury will improve Outcome: Progressing   Problem: Pain Managment: Goal: General experience of comfort will improve Outcome: Not Progressing

## 2022-04-20 NOTE — Progress Notes (Signed)
Patient is alert and oriented x 4.  Patient with flat affect.  Patient reports he slept well and had good appetite this morning. Patient states his anxiety medication is helping and his anxiety is minimal.  Denies SI/HI, AVH and depression. Pain 7/10 (back).  Complaint with medications.  15 min checks in place.  Patient is safe on the unit. Patient isolated to room.  Minimal interaction with peers.

## 2022-04-20 NOTE — Progress Notes (Signed)
Patient is alert and oriented times 4. Mood and affect are depressed. Patient rates pain as 0/10. He denies SI, HI, and AVH. Patient does endorse feelings of anxiety but no depression at this time. Patient states he slept well last night. Evening medicines administered whole by mouth without difficulty. Patient ate snack in day room; appetite was fair. Patient remains on unit with Q15 minute checks in place.

## 2022-04-20 NOTE — [facility] Group Notes (Signed)
LCSW Group Therapy Note   04/20/2022 1:30pm   Type of Therapy and Topic:  Group Therapy:  Overcoming Obstacles   Participation Level:  Active   Description of Group:    In this group patients will be encouraged to explore what they see as obstacles to their own wellness and recovery. They will be guided to discuss their thoughts, feelings, and behaviors related to these obstacles. The group will process together ways to cope with barriers, with attention given to specific choices patients can make. Each patient will be challenged to identify changes they are motivated to make in order to overcome their obstacles. This group will be process-oriented, with patients participating in exploration of their own experiences as well as giving and receiving support and challenge from other group members.   Therapeutic Goals: Patient will identify personal and current obstacles as they relate to admission. Patient will identify barriers that currently interfere with their wellness or overcoming obstacles.  Patient will identify feelings, thought process and behaviors related to these barriers. Patient will identify two changes they are willing to make to overcome these obstacles:      Summary of Patient Progress: pt active and appropriate during group.  Initially identified conflict with a neighbor as an obstacle, but later also identified his mental health dx as an obstacle and verbalized need for his medication.  Pt stated he ran out of his medication and the pharmacy did not have it, leading to his being off for several days.        Therapeutic Modalities:   Cognitive Behavioral Therapy Solution Focused Therapy Motivational Interviewing Relapse Prevention Therapy  Joanne Chars, LCSW 04/20/2022 2:49 PM

## 2022-04-20 NOTE — Progress Notes (Signed)
[facility] MD Progress Note  04/20/2022 11:22 AM Danny Cook.  MRN:  [id number] Subjective:  10/22 Patient was seen pleasantly lying in his bed.  He reports "no depression."  Anxiety is low.  He slept fair last night.  Reports having back pain.  He told the nurse that he can take ibuprofen. He does ambulate with a walker.  Eating well.  Patient tells me that he is looking forward to going to a day program after he leaves here.  Also reports being constipated for the past 3 days.  10/21 Patient is mild-mannered this morning.  States that his depression is better.  Denies psychosis.  Denies any mood anger.  Appetite is improved.  He is taking his medications.  Reports some lower back pain.  No falls.  Tells me that he slept fair, woke up several times. Principal Problem: Severe recurrent major depression with psychotic features ([place]) Diagnosis: Principal Problem:   Severe recurrent major depression with psychotic features ([place]) Active Problems:   Chronic diastolic heart failure (HCC)   HTN (hypertension)   Diabetes ([place])   HIV positive ([place])  Total Time spent with patient: 27 minutes  Past Psychiatric History: Past history of chronic mental health problems mostly with depression and also chronic medical illness  Past Medical History:  Past Medical History:  Diagnosis Date   Anemia    Anxiety    Arthritis    CHF (congestive heart failure) ([place])    Chronic kidney disease    Renal Insufficiency Syndrome; Glomerulosclerosis 2013   Depressed    Diabetes mellitus without complication (HCC)    GERD (gastroesophageal reflux disease)    Headache    High cholesterol    History of kidney stones    HIV (human immunodeficiency virus infection) ([place])    Hypertension    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[place])    Myocardial infarction ([place])    Paranoid disorder ([place])    Pneumonia    Schizophrenia, paranoid ([place])    Sleep apnea    Stroke ([place])    mini TIA   TIA (transient ischemic attack)      Past Surgical History:  Procedure Laterality Date   CARDIAC CATHETERIZATION     CATARACT EXTRACTION W/PHACO Right 04/25/2021   Procedure: CATARACT EXTRACTION PHACO AND INTRAOCULAR LENS PLACEMENT ([place]) RIGHT DIABETIC;  Surgeon: Birder Robson, MD;  Location: [facility] ORS;  Service: Ophthalmology;  Laterality: Right;  3.32 0:23.0   CATARACT EXTRACTION W/PHACO Left 09/19/2021   Procedure: CATARACT EXTRACTION PHACO AND INTRAOCULAR LENS PLACEMENT ([place]) LEFT DIABETIC;  Surgeon: Birder Robson, MD;  Location: [facility] ORS;  Service: Ophthalmology;  Laterality: Left;  6.73 0:49.1   COLONOSCOPY WITH PROPOFOL N/A 09/03/2015   Procedure: COLONOSCOPY WITH PROPOFOL;  Surgeon: Lollie Sails, MD;  Location: [facility] ENDOSCOPY;  Service: Endoscopy;  Laterality: N/A;   COLONOSCOPY WITH PROPOFOL N/A 09/17/2020   Procedure: COLONOSCOPY WITH PROPOFOL;  Surgeon: Lesly Rubenstein, MD;  Location: [facility] ENDOSCOPY;  Service: Endoscopy;  Laterality: N/A;  PREFERS 9AM OR LATER DUE TO TRANSPORTATION   DG TEETH FULL     ESOPHAGOGASTRODUODENOSCOPY (EGD) WITH PROPOFOL N/A 07/01/2016   Dr. [name], [facility]. abnormal esophageal motility, suspicious for presbyesophagus, bile gastritis s/p biopsy, non-bleeding erosive gastropathy s/p biopsy, normal duodenum, path with negative H.pylori and +chronic active gastritis   ESOPHAGOGASTRODUODENOSCOPY (EGD) WITH PROPOFOL N/A 09/17/2020   Procedure: ESOPHAGOGASTRODUODENOSCOPY (EGD) WITH PROPOFOL;  Surgeon: Lesly Rubenstein, MD;  Location: [facility] ENDOSCOPY;  Service: Endoscopy;  Laterality: N/A;   EXTRACORPOREAL SHOCK  WAVE LITHOTRIPSY Right 11/01/2020   Procedure: EXTRACORPOREAL SHOCK WAVE LITHOTRIPSY (ESWL);  Surgeon: Abbie Sons, MD;  Location: [facility] ORS;  Service: Urology;  Laterality: Right;   LUMBAR LAMINECTOMY/DECOMPRESSION MICRODISCECTOMY N/A 07/15/2021   Procedure: L3-5 DECOMPRESSION;  Surgeon: Meade Maw, MD;  Location: [facility] ORS;  Service: Neurosurgery;  Laterality: N/A;    Family History:  Family History  Problem Relation Age of Onset   Heart attack Mother    Diabetes Mellitus II Mother    Mental illness Mother    CAD Mother    Heart attack Father    CAD Father    Hypertension Father    Family Psychiatric  History: See previous Social History:  Social History   Substance and Sexual Activity  Alcohol Use No     Social History   Substance and Sexual Activity  Drug Use No   Comment: denies street drugs    Social History   Socioeconomic History   Marital status: Single    Spouse name: Not on file   Number of children: Not on file   Years of education: 11   Highest education level: GED or equivalent  Occupational History   Not on file  Tobacco Use   Smoking status: Never   Smokeless tobacco: Never  Vaping Use   Vaping Use: Never used  Substance and Sexual Activity   Alcohol use: No   Drug use: No    Comment: denies street drugs   Sexual activity: Never  Other Topics Concern   Not on file  Social History Narrative   Not on file   Social Determinants of Health   Financial Resource Strain: Low Risk  (08/12/2017)   Overall Financial Resource Strain (CARDIA)    Difficulty of Paying Living Expenses: Not hard at all  Food Insecurity: Food Insecurity Present (04/15/2022)   Hunger Vital Sign    Worried About [place] in the Last Year: Sometimes true    Ran Out of Food in the Last Year: Sometimes true  Transportation Needs: Unmet Transportation Needs (04/15/2022)   PRAPARE - Hydrologist (Medical): Yes    Lack of Transportation (Non-Medical): Yes  Physical Activity: Inactive (08/12/2017)   Exercise Vital Sign    Days of Exercise per Week: 0 days    Minutes of Exercise per Session: 0 min  Stress: Stress Concern Present (08/12/2017)   [place]    Feeling of Stress : To some extent  Social Connections: Moderately Isolated  (08/12/2017)   Social Connection and Isolation Panel [NHANES]    Frequency of Communication with Friends and Family: More than three times a week    Frequency of Social Gatherings with Friends and Family: Three times a week    Attends Religious Services: Never    Active Member of Clubs or Organizations: No    Attends Archivist Meetings: Never    Marital Status: Never married   Additional Social History:                         Sleep: Fair  Appetite:  Fair  Current Medications: Current Facility-Administered Medications  Medication Dose Route Frequency Provider Last Rate Last Admin   abacavir-dolutegravir-lamiVUDine (TRIUMEQ) 600-50-300 MG per tablet 1 tablet  1 tablet Oral QHS Caroline Sauger, NP   1 tablet at 04/19/22 2120   acetaminophen (TYLENOL) tablet 650 mg  650 mg Oral Q6H PRN  Caroline Sauger, NP       albuterol (VENTOLIN HFA) 108 (90 Base) MCG/ACT inhaler 2 puff  2 puff Inhalation Q6H PRN Caroline Sauger, NP       alum & mag hydroxide-simeth (MAALOX/MYLANTA) 200-200-20 MG/5ML suspension 30 mL  30 mL Oral Q4H PRN Caroline Sauger, NP       aspirin tablet 325 mg  325 mg Oral Daily Caroline Sauger, NP   325 mg at 04/20/22 4854   diazepam (VALIUM) tablet 5 mg  5 mg Oral TID Clapacs, John T, MD   5 mg at 04/20/22 0827   dicyclomine (BENTYL) capsule 10 mg  10 mg Oral TID AC & HS Caroline Sauger, NP   10 mg at 04/20/22 0827   divalproex (DEPAKOTE ER) 24 hr tablet 500 mg  500 mg Oral QHS Caroline Sauger, NP   500 mg at 04/19/22 2120   donepezil (ARICEPT) tablet 5 mg  5 mg Oral Daily Caroline Sauger, NP   5 mg at 04/20/22 0827   fluticasone (FLOVENT HFA) 220 MCG/ACT inhaler 2 puff  2 puff Inhalation BID Caroline Sauger, NP   2 puff at 04/20/22 6270   gabapentin (NEURONTIN) capsule 800 mg  800 mg Oral TID Caroline Sauger, NP   800 mg at 04/20/22 0827   hydrOXYzine (ATARAX) tablet 25 mg  25 mg Oral TID PRN Clapacs, Madie [place],  MD   25 mg at 04/19/22 2120   losartan (COZAAR) tablet 25 mg  25 mg Oral Daily Caroline Sauger, NP   25 mg at 04/20/22 3500   magnesium hydroxide (MILK OF MAGNESIA) suspension 30 mL  30 mL Oral Daily PRN Caroline Sauger, NP   30 mL at 04/16/22 1139   memantine (NAMENDA) tablet 10 mg  10 mg Oral BID Caroline Sauger, NP   10 mg at 04/20/22 0827   metFORMIN (GLUCOPHAGE) tablet 1,000 mg  1,000 mg Oral BID WC Caroline Sauger, NP   1,000 mg at 04/20/22 9381   mirtazapine (REMERON) tablet 45 mg  45 mg Oral QHS Clapacs, John T, MD   45 mg at 04/19/22 2120   venlafaxine XR (EFFEXOR-XR) 24 hr capsule 150 mg  150 mg Oral Q breakfast Clapacs, John T, MD   150 mg at 04/20/22 8299   ziprasidone (GEODON) capsule 40 mg  40 mg Oral BID WC Clapacs, Madie Reno, MD   40 mg at 04/20/22 0827    Lab Results:  No results found for this or any previous visit (from the past 48 hour(s)).   Blood Alcohol level:  Lab Results  Component Value Date   ETH <10 04/14/2022   ETH <10 37/16/9678    Metabolic Disorder Labs: Lab Results  Component Value Date   HGBA1C 5.8 (H) 04/15/2022   MPG 119.76 04/15/2022   MPG 134.11 07/31/2020   No results found for: "PROLACTIN" Lab Results  Component Value Date   CHOL 154 04/15/2022   TRIG 259 (H) 04/15/2022   HDL 27 (L) 04/15/2022   CHOLHDL 5.7 04/15/2022   VLDL 52 (H) 04/15/2022   LDLCALC 75 04/15/2022   LDLCALC 106 (H) 11/05/2017    Physical Findings: AIMS:  , ,  ,  ,    CIWA:    COWS:     Musculoskeletal: Strength & Muscle Tone: within normal limits Gait & Station: normal Patient leans: N/A  Psychiatric Specialty Exam:  Presentation  General Appearance:  Casual  Eye Contact: Good  Speech: Clear and Coherent  Speech Volume: Normal  Handedness:No  data recorded  Mood and Affect  Mood: Depressed  Affect: Congruent   Thought Process  Thought Processes: Coherent  Descriptions of Associations:Intact  Orientation:Full  (Time, Place and Person)  Thought Content:WDL  History of Schizophrenia/Schizoaffective disorder:Yes  Duration of Psychotic Symptoms:No data recorded Hallucinations:No data recorded Ideas of Reference:None  Suicidal Thoughts:No data recorded Homicidal Thoughts:No data recorded  Sensorium  Memory: Immediate Fair  Judgment: Fair to good Insight: Fair   Materials engineer: Fair  Attention Span: Fair  Recall: [employer] of Knowledge: Fair  Language: Fair   Psychomotor Activity  Psychomotor Activity:No data recorded  Assets  Assets: Desire for Improvement; Housing; Resilience   Sleep  Sleep:No data recorded   Physical Exam: Physical Exam Vitals and nursing note reviewed.  Constitutional:      Appearance: Normal appearance.  HENT:     Head: Normocephalic and atraumatic.     Mouth/Throat:     Pharynx: Oropharynx is clear.  Eyes:     Pupils: Pupils are equal, round, and reactive to light.  Cardiovascular:     Rate and Rhythm: Normal rate and regular rhythm.  Pulmonary:     Effort: Pulmonary effort is normal.     Breath sounds: Normal breath sounds.  Abdominal:     General: Abdomen is flat.     Palpations: Abdomen is soft.  Musculoskeletal:        General: Normal range of motion.  Skin:    General: Skin is warm and dry.  Neurological:     General: No focal deficit present.     Mental Status: He is alert. Mental status is at baseline.  Psychiatric:        Attention and Perception: Attention normal.        Mood and Affect: Mood normal.        Speech: Speech normal.        Behavior: Behavior normal.        Thought Content: Thought content normal.        Cognition and Memory: Cognition normal.        Judgment: Judgment normal.    Review of Systems  Constitutional: Negative.   HENT: Negative.    Eyes: Negative.   Respiratory: Negative.    Cardiovascular: Negative.   Gastrointestinal: Negative.   Musculoskeletal:  Negative.   Skin: Negative.   Neurological: Negative.   Psychiatric/Behavioral: Negative.     Blood pressure 133/85, pulse 65, temperature 97.9 F (36.6 C), temperature source Oral, resp. rate 18, SpO2 99 %. There is no height or weight on file to calculate BMI.   Treatment Plan Summary: Medication management and Plan no change to medication management.  Supportive counseling and therapy.  Encourage continued group interaction.  Noted that PT and OT are supportive of home health treatment team will continue to work on that.  10/21 No changes  10/22 Add Lidoderm patch for back pain Add MiraLAX p.o. daily x 3 days  Rulon Sera, MD 04/20/2022, 11:22 AM

## 2022-04-21 NOTE — Progress Notes (Signed)
   04/21/22 0741  Psych Admission Type (Psych Patients Only)  Admission Status Voluntary  Psychosocial Assessment  Patient Complaints Nervousness;Anxiety  Eye Contact Fair  Facial Expression Flat  Affect Appropriate to circumstance  Speech Logical/coherent  Interaction Assertive  Motor Activity Slow  Appearance/Hygiene Unremarkable  Behavior Characteristics Cooperative;Appropriate to situation  Mood Pleasant  Thought Process  Coherency WDL  Content WDL  Delusions None reported or observed  Perception WDL  Hallucination None reported or observed  Judgment WDL  Confusion Mild  Danger to Self  Current suicidal ideation? Denies  Danger to Others  Danger to Others None reported or observed

## 2022-04-21 NOTE — Group Note (Signed)
LCSW Group Therapy Note  Group Date: 04/21/2022 Start Time: 1300 End Time: 1400   Type of Therapy and Topic:  Group Therapy - Healthy vs Unhealthy Coping Skills  Participation Level:  Minimal   Description of Group The focus of this group was to determine what unhealthy coping techniques typically are used by group members and what healthy coping techniques would be helpful in coping with various problems. Patients were guided in becoming aware of the differences between healthy and unhealthy coping techniques. Patients were asked to identify 2-3 healthy coping skills they would like to learn to use more effectively.  Therapeutic Goals Patients learned that coping is what human beings do all day long to deal with various situations in their lives Patients defined and discussed healthy vs unhealthy coping techniques Patients identified their preferred coping techniques and identified whether these were healthy or unhealthy Patients determined 2-3 healthy coping skills they would like to become more familiar with and use more often. Patients provided support and ideas to each other   Summary of Patient Progress:   Patient was present in group.  Patient appeared attentive and supportive of others.  Patient was not very talkative in group, though appeared to follow along with group discussions.   Therapeutic Modalities Cognitive Behavioral Therapy Motivational Interviewing  Rozann Lesches, [place] 04/21/2022  2:13 PM

## 2022-04-21 NOTE — BH IP Treatment Plan (Signed)
Interdisciplinary Treatment and Diagnostic Plan Update  04/21/2022 Time of Session: 8:30AM Danny Cook. MRN: [id number]  Principal Diagnosis: Severe recurrent major depression with psychotic features ([place])  Secondary Diagnoses: Principal Problem:   Severe recurrent major depression with psychotic features ([place]) Active Problems:   Chronic diastolic heart failure ([place])   HTN (hypertension)   Diabetes ([place])   HIV positive ([place])   Current Medications:  Current Facility-Administered Medications  Medication Dose Route Frequency Provider Last Rate Last Admin   abacavir-dolutegravir-lamiVUDine (TRIUMEQ) 600-50-300 MG per tablet 1 tablet  1 tablet Oral QHS Caroline Sauger, NP   1 tablet at 04/20/22 2140   acetaminophen (TYLENOL) tablet 650 mg  650 mg Oral Q6H PRN Caroline Sauger, NP       albuterol (VENTOLIN HFA) 108 (90 Base) MCG/ACT inhaler 2 puff  2 puff Inhalation Q6H PRN Caroline Sauger, NP       alum & mag hydroxide-simeth (MAALOX/MYLANTA) 200-200-20 MG/5ML suspension 30 mL  30 mL Oral Q4H PRN Caroline Sauger, NP       aspirin tablet 325 mg  325 mg Oral Daily Caroline Sauger, NP   325 mg at 04/21/22 0940   diazepam (VALIUM) tablet 5 mg  5 mg Oral TID Clapacs, John T, MD   5 mg at 04/21/22 0940   dicyclomine (BENTYL) capsule 10 mg  10 mg Oral TID AC & HS Caroline Sauger, NP   10 mg at 04/21/22 1236   divalproex (DEPAKOTE ER) 24 hr tablet 500 mg  500 mg Oral QHS Caroline Sauger, NP   500 mg at 04/20/22 2140   donepezil (ARICEPT) tablet 5 mg  5 mg Oral Daily Caroline Sauger, NP   5 mg at 04/21/22 0941   fluticasone (FLOVENT HFA) 220 MCG/ACT inhaler 2 puff  2 puff Inhalation BID Caroline Sauger, NP   2 puff at 04/21/22 0936   gabapentin (NEURONTIN) capsule 800 mg  800 mg Oral TID Caroline Sauger, NP   800 mg at 04/21/22 0941   hydrOXYzine (ATARAX) tablet 25 mg  25 mg Oral TID PRN Clapacs, John T, MD   25 mg at 04/21/22 1236    lidocaine (LIDODERM) 5 % 1 patch  1 patch Transdermal Q24H Rulon Sera, MD   1 patch at 04/21/22 1235   losartan (COZAAR) tablet 25 mg  25 mg Oral Daily Caroline Sauger, NP   25 mg at 04/21/22 0941   magnesium hydroxide (MILK OF MAGNESIA) suspension 30 mL  30 mL Oral Daily PRN Caroline Sauger, NP   30 mL at 04/16/22 1139   memantine (NAMENDA) tablet 10 mg  10 mg Oral BID Caroline Sauger, NP   10 mg at 04/21/22 0941   metFORMIN (GLUCOPHAGE) tablet 1,000 mg  1,000 mg Oral BID WC Caroline Sauger, NP   1,000 mg at 04/21/22 0940   mirtazapine (REMERON) tablet 45 mg  45 mg Oral QHS Clapacs, John T, MD   45 mg at 04/20/22 2140   polyethylene glycol (MIRALAX / GLYCOLAX) packet 17 g  17 g Oral Daily Rulon Sera, MD   17 g at 04/21/22 3976   venlafaxine XR (EFFEXOR-XR) 24 hr capsule 150 mg  150 mg Oral Q breakfast Clapacs, John T, MD   150 mg at 04/21/22 0940   ziprasidone (GEODON) capsule 40 mg  40 mg Oral BID WC Clapacs, Madie [place], MD   40 mg at 04/21/22 0941   PTA Medications: Medications Prior to Admission  Medication Sig Dispense Refill Last Dose   abacavir-dolutegravir-lamiVUDine (  TRIUMEQ) 600-50-300 MG tablet Take 1 tablet by mouth at bedtime. 30 tablet 6    acetaminophen (TYLENOL) 650 MG CR tablet Take 650 mg by mouth every 8 (eight) hours as needed for pain.      albuterol (PROVENTIL HFA;VENTOLIN HFA) 108 (90 Base) MCG/ACT inhaler Inhale 2 puffs into the lungs every 6 (six) hours as needed for wheezing or shortness of breath. 1 Inhaler 2    aspirin 325 MG tablet Take 325 mg by mouth daily.      benztropine (COGENTIN) 0.5 MG tablet Take 1.5 mg by mouth daily. Pt unsure of dose (Patient not taking: Reported on 04/14/2022)      bismuth subsalicylate (PEPTO BISMOL) 262 MG/15ML suspension Take 15 mLs by mouth every 6 (six) hours as needed for indigestion. 1 tbsp      brimonidine-timolol (COMBIGAN) 0.2-0.5 % ophthalmic solution Place 1 drop into both eyes every 12 (twelve) hours.       diazepam (VALIUM) 5 MG tablet Take 2.5-5 mg by mouth See admin instructions. 5 mg in the morning, 2.5 mg midday, 5 mg in the evening      dicyclomine (BENTYL) 10 MG capsule Take 10 mg by mouth 4 (four) times daily -  before meals and at bedtime.      divalproex (DEPAKOTE ER) 500 MG 24 hr tablet Take 500 mg by mouth at bedtime.       docusate sodium (COLACE) 250 MG capsule Take 250 mg by mouth 2 (two) times daily.      donepezil (ARICEPT) 10 MG tablet Take 5 mg by mouth daily.      FANAPT 12 MG TABS Take 12 mg by mouth 2 (two) times daily.  (Patient not taking: Reported on 04/14/2022)      Fluocinolone Acetonide 0.01 % OIL Place 2-4 drops in ear(s) every other day. At bedtime for dry skin      fluticasone (FLOVENT HFA) 220 MCG/ACT inhaler Inhale 2 puffs into the lungs 2 (two) times daily. Rinse out mouth afterwards 1 Inhaler 12    gabapentin (NEURONTIN) 600 MG tablet Take 800 mg by mouth 3 (three) times daily.      gemfibrozil (LOPID) 600 MG tablet Take 600 mg by mouth 2 (two) times daily before a meal.      glimepiride (AMARYL) 4 MG tablet Take 4 mg by mouth daily with breakfast.       glycopyrrolate (ROBINUL) 2 MG tablet Take 0.5 mg by mouth 2 (two) times daily.      guaiFENesin (MUCINEX) 600 MG 12 hr tablet Take 600 mg by mouth 2 (two) times daily.      HUMALOG KWIKPEN 100 UNIT/ML KwikPen Inject 6 Units into the skin 3 (three) times daily. Only takes Humalog if blood sugar > 200. Takes 2 units if > 200. Additional instructions per sliding scale (Patient not taking: Reported on 04/14/2022)      Insulin Degludec (TRESIBA FLEXTOUCH) 200 UNIT/ML SOPN Inject 10 Units into the skin daily. (Patient not taking: Reported on 04/14/2022)      losartan (COZAAR) 25 MG tablet Take 25 mg by mouth daily.      memantine (NAMENDA) 10 MG tablet Take 10 mg by mouth 2 (two) times daily.      metFORMIN (GLUCOPHAGE) 1000 MG tablet Take 1,000 mg by mouth 2 (two) times daily with a meal.      methocarbamol (ROBAXIN) 500  MG tablet Take 1 tablet (500 mg total) by mouth 4 (four) times daily. 120 tablet 0  mirtazapine (REMERON) 30 MG tablet Take 30 mg by mouth at bedtime.      mometasone (ELOCON) 0.1 % ointment Apply 1 application topically See admin instructions. Apply 4 drops to ear canal at bedtime every other night for itching and dry skin.      nitroGLYCERIN (NITROSTAT) 0.4 MG SL tablet Place 0.4 mg under the tongue every 5 (five) minutes as needed for chest pain.       Omega-3 Fatty Acids (FISH OIL) 1000 MG CAPS Take 1,000 mg by mouth daily.      omeprazole (PRILOSEC) 40 MG capsule Take 40 mg by mouth 2 (two) times daily.       OZEMPIC, 1 MG/DOSE, 4 MG/3ML SOPN Inject 2 mg into the skin every Monday. (Patient not taking: Reported on 04/14/2022)      rosuvastatin (CRESTOR) 40 MG tablet Take 40 mg by mouth daily.       senna (SENOKOT) 8.6 MG TABS tablet Take 1 tablet by mouth daily.       sucralfate (CARAFATE) 1 g tablet Take 1 g by mouth 4 (four) times daily.  (Patient not taking: Reported on 04/14/2022)      tamsulosin (FLOMAX) 0.4 MG CAPS capsule Take 0.4 mg by mouth daily.      venlafaxine (EFFEXOR) 37.5 MG tablet Take 37.5 mg by mouth 2 (two) times daily. Take with 75 mg in am      venlafaxine XR (EFFEXOR-XR) 75 MG 24 hr capsule Take 1 capsule (75 mg total) by mouth daily with breakfast. 30 capsule 0    ziprasidone (GEODON) 40 MG capsule Take 40 mg by mouth daily.       Patient Stressors:    Patient Strengths:    Treatment Modalities: Medication Management, Group therapy, Case management,  1 to 1 session with clinician, Psychoeducation, Recreational therapy.   Physician Treatment Plan for Primary Diagnosis: Severe recurrent major depression with psychotic features ([place]) Long Term Goal(s): Improvement in symptoms so as ready for discharge   Short Term Goals: Ability to maintain clinical measurements within normal limits will improve Compliance with prescribed medications will improve Ability to  verbalize feelings will improve Ability to disclose and discuss suicidal ideas Ability to demonstrate self-control will improve  Medication Management: Evaluate patient's response, side effects, and tolerance of medication regimen.  Therapeutic Interventions: 1 to 1 sessions, Unit Group sessions and Medication administration.  Evaluation of Outcomes: Progressing  Physician Treatment Plan for Secondary Diagnosis: Principal Problem:   Severe recurrent major depression with psychotic features ([place]) Active Problems:   Chronic diastolic heart failure ([facility])   HTN (hypertension)   Diabetes ([place])   HIV positive ([place])  Long Term Goal(s): Improvement in symptoms so as ready for discharge   Short Term Goals: Ability to maintain clinical measurements within normal limits will improve Compliance with prescribed medications will improve Ability to verbalize feelings will improve Ability to disclose and discuss suicidal ideas Ability to demonstrate self-control will improve     Medication Management: Evaluate patient's response, side effects, and tolerance of medication regimen.  Therapeutic Interventions: 1 to 1 sessions, Unit Group sessions and Medication administration.  Evaluation of Outcomes: Progressing   RN Treatment Plan for Primary Diagnosis: Severe recurrent major depression with psychotic features ([place]) Long Term Goal(s): Knowledge of disease and therapeutic regimen to maintain health will improve  Short Term Goals: Ability to demonstrate self-control, Ability to participate in decision making will improve, Ability to verbalize feelings will improve, Ability to disclose and discuss suicidal ideas, Ability  to identify and develop effective coping behaviors will improve, and Compliance with prescribed medications will improve  Medication Management: RN will administer medications as ordered by provider, will assess and evaluate patient's response and provide education to patient for  prescribed medication. RN will report any adverse and/or side effects to prescribing provider.  Therapeutic Interventions: 1 on 1 counseling sessions, Psychoeducation, Medication administration, Evaluate responses to treatment, Monitor vital signs and CBGs as ordered, Perform/monitor CIWA, COWS, AIMS and Fall Risk screenings as ordered, Perform wound care treatments as ordered.  Evaluation of Outcomes: Progressing   LCSW Treatment Plan for Primary Diagnosis: Severe recurrent major depression with psychotic features ([place]) Long Term Goal(s): Safe transition to appropriate next level of care at discharge, Engage patient in therapeutic group addressing interpersonal concerns.  Short Term Goals: Engage patient in aftercare planning with referrals and resources, Increase social support, Increase ability to appropriately verbalize feelings, Increase emotional regulation, Facilitate acceptance of mental health diagnosis and concerns, and Increase skills for wellness and recovery  Therapeutic Interventions: Assess for all discharge needs, 1 to 1 time with Social worker, Explore available resources and support systems, Assess for adequacy in community support network, Educate family and significant other(s) on suicide prevention, Complete Psychosocial Assessment, Interpersonal group therapy.  Evaluation of Outcomes: Progressing   Progress in Treatment: Attending groups: Yes. Participating in groups: Yes. Taking medication as prescribed: Yes. Toleration medication: Yes. Family/Significant other contact made: Yes, individual(s) contacted:  SPE completed with the patient's brother.  Patient understands diagnosis: Yes. Discussing patient identified problems/goals with staff: Yes. Medical problems stabilized or resolved: Yes. Denies suicidal/homicidal ideation: Yes. Issues/concerns per patient self-inventory: No. Other: none  New problem(s) identified: No, Describe:  none  Update 04/21/2022:  No  changes at this time.    New Short Term/Long Term Goal(s): medication management for mood stabilization; elimination of SI thoughts; development of comprehensive mental wellness plan.  Update 04/21/2022:  No changes at this time.    Patient Goals: "I know I got to get back home" Update 04/21/2022:  No changes at this time.    Discharge Plan or Barriers: Patient was initially reporting plans of being discharged to a [facility].  However, this appears to have changed and patient is considering a return to his home.  Psychiatrist is putting in consults for PT and OT to determine needs for any change in living situation and or orders for home health PT/OT. Update 04/21/2022:  Patient has been recommended for home health PT/OT.  [name] to follow up on getting this scheduled.  Patient reports plans of returning to his home. [name] to schedule aftercare appointments.    Reason for Continuation of Hospitalization: Anxiety Depression Medication stabilization Suicidal ideation   Estimated Length of Stay:  1-7 days  Last 3 [place] Suicide Severity Risk Score: [place] Admission (Current) from 04/15/2022 in [facility] ED from 04/14/2022 in [facility] ED from 04/06/2022 in [facility] No Risk Moderate Risk No Risk       Last PHQ 2/9 Scores:    09/13/2021    9:02 AM 03/05/2021    9:21 AM 01/26/2018   11:03 AM  Depression screen PHQ 2/9  Decreased Interest 0 0 0  Down, Depressed, Hopeless 0 0 1  PHQ - 2 Score 0 0 1    Scribe for Treatment Team: Rozann Lesches, Marlinda Mike 04/21/2022 12:57 PM

## 2022-04-21 NOTE — Progress Notes (Signed)
Patient is alert and oriented times 4. Mood and affect appropriate. Patient rates pain as 2/10. He denies SI, HI, and AVH. Patient does endorse feelings of anxiety at this time. Patient states he slept well last night. Evening medicines administered whole by mouth without difficulty. Patient ate snack in day room; appetite was fair. Patient remains on unit with Q15 minute checks in place.

## 2022-04-21 NOTE — Progress Notes (Signed)
[facility] MD Progress Note  04/21/2022 1:55 PM Danny Cook.  MRN:  [id number] Subjective: Danny Cook is seen on rounds.  He has been compliant with his medication.  He tells me that he plans on returning to his own apartment even though he was in a group home 6 months ago.  He states as long as he can get some help once or twice a week with occupational and physical therapy that is where he wants to go.  He says his mood is improved.  He is on his HIV medications and Neurontin, Valium, Depakote, Remeron, Effexor XR, and Geodon.  He denies any side effects.  He sees Dr. Collie Siad at [facility] in [place].  Principal Problem: Severe recurrent major depression with psychotic features ([place]) Diagnosis: Principal Problem:   Severe recurrent major depression with psychotic features ([place]) Active Problems:   Chronic diastolic heart failure (HCC)   HTN (hypertension)   Diabetes ([place])   HIV positive ([place])  Total Time spent with patient: 15 minutes  Past Psychiatric History: Past history of chronic mental health problems mostly with depression and also chronic medical illness  Past Medical History:  Past Medical History:  Diagnosis Date   Anemia    Anxiety    Arthritis    CHF (congestive heart failure) ([place])    Chronic kidney disease    Renal Insufficiency Syndrome; Glomerulosclerosis 2013   Depressed    Diabetes mellitus without complication (HCC)    GERD (gastroesophageal reflux disease)    Headache    High cholesterol    History of kidney stones    HIV (human immunodeficiency virus infection) ([place])    Hypertension    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[place])    Myocardial infarction ([place])    Paranoid disorder ([place])    Pneumonia    Schizophrenia, paranoid ([place])    Sleep apnea    Stroke ([place])    mini TIA   TIA (transient ischemic attack)     Past Surgical History:  Procedure Laterality Date   CARDIAC CATHETERIZATION     CATARACT EXTRACTION W/PHACO Right 04/25/2021   Procedure:  CATARACT EXTRACTION PHACO AND INTRAOCULAR LENS PLACEMENT ([place]) RIGHT DIABETIC;  Surgeon: Birder Robson, MD;  Location: [facility] ORS;  Service: Ophthalmology;  Laterality: Right;  3.32 0:23.0   CATARACT EXTRACTION W/PHACO Left 09/19/2021   Procedure: CATARACT EXTRACTION PHACO AND INTRAOCULAR LENS PLACEMENT ([place]) LEFT DIABETIC;  Surgeon: Birder Robson, MD;  Location: [facility] ORS;  Service: Ophthalmology;  Laterality: Left;  6.73 0:49.1   COLONOSCOPY WITH PROPOFOL N/A 09/03/2015   Procedure: COLONOSCOPY WITH PROPOFOL;  Surgeon: Lollie Sails, MD;  Location: [facility] ENDOSCOPY;  Service: Endoscopy;  Laterality: N/A;   COLONOSCOPY WITH PROPOFOL N/A 09/17/2020   Procedure: COLONOSCOPY WITH PROPOFOL;  Surgeon: Lesly Rubenstein, MD;  Location: [facility] ENDOSCOPY;  Service: Endoscopy;  Laterality: N/A;  PREFERS 9AM OR LATER DUE TO TRANSPORTATION   DG TEETH FULL     ESOPHAGOGASTRODUODENOSCOPY (EGD) WITH PROPOFOL N/A 07/01/2016   Dr. [name], [facility]. abnormal esophageal motility, suspicious for presbyesophagus, bile gastritis s/p biopsy, non-bleeding erosive gastropathy s/p biopsy, normal duodenum, path with negative H.pylori and +chronic active gastritis   ESOPHAGOGASTRODUODENOSCOPY (EGD) WITH PROPOFOL N/A 09/17/2020   Procedure: ESOPHAGOGASTRODUODENOSCOPY (EGD) WITH PROPOFOL;  Surgeon: Lesly Rubenstein, MD;  Location: [facility] ENDOSCOPY;  Service: Endoscopy;  Laterality: N/A;   EXTRACORPOREAL SHOCK WAVE LITHOTRIPSY Right 11/01/2020   Procedure: EXTRACORPOREAL SHOCK WAVE LITHOTRIPSY (ESWL);  Surgeon: Abbie Sons, MD;  Location: [facility] ORS;  Service:  Urology;  Laterality: Right;   LUMBAR LAMINECTOMY/DECOMPRESSION MICRODISCECTOMY N/A 07/15/2021   Procedure: L3-5 DECOMPRESSION;  Surgeon: Meade Maw, MD;  Location: [facility] ORS;  Service: Neurosurgery;  Laterality: N/A;   Family History:  Family History  Problem Relation Age of Onset   Heart attack Mother    Diabetes Mellitus II Mother    Mental illness  Mother    CAD Mother    Heart attack Father    CAD Father    Hypertension Father     Social History:  Social History   Substance and Sexual Activity  Alcohol Use No     Social History   Substance and Sexual Activity  Drug Use No   Comment: denies street drugs    Social History   Socioeconomic History   Marital status: Single    Spouse name: Not on file   Number of children: Not on file   Years of education: 11   Highest education level: GED or equivalent  Occupational History   Not on file  Tobacco Use   Smoking status: Never   Smokeless tobacco: Never  Vaping Use   Vaping Use: Never used  Substance and Sexual Activity   Alcohol use: No   Drug use: No    Comment: denies street drugs   Sexual activity: Never  Other Topics Concern   Not on file  Social History Narrative   Not on file   Social Determinants of Health   Financial Resource Strain: Low Risk  (08/12/2017)   Overall Financial Resource Strain (CARDIA)    Difficulty of Paying Living Expenses: Not hard at all  Food Insecurity: Food Insecurity Present (04/15/2022)   Hunger Vital Sign    Worried About [place] in the Last Year: Sometimes true    Ran Out of Food in the Last Year: Sometimes true  Transportation Needs: Unmet Transportation Needs (04/15/2022)   PRAPARE - Hydrologist (Medical): Yes    Lack of Transportation (Non-Medical): Yes  Physical Activity: Inactive (08/12/2017)   Exercise Vital Sign    Days of Exercise per Week: 0 days    Minutes of Exercise per Session: 0 min  Stress: Stress Concern Present (08/12/2017)   [place]    Feeling of Stress : To some extent  Social Connections: Moderately Isolated (08/12/2017)   Social Connection and Isolation Panel [NHANES]    Frequency of Communication with Friends and Family: More than three times a week    Frequency of Social Gatherings with  Friends and Family: Three times a week    Attends Religious Services: Never    Active Member of Clubs or Organizations: No    Attends Archivist Meetings: Never    Marital Status: Never married   Additional Social History:                         Sleep: Good  Appetite:  Good  Current Medications: Current Facility-Administered Medications  Medication Dose Route Frequency Provider Last Rate Last Admin   abacavir-dolutegravir-lamiVUDine (TRIUMEQ) 600-50-300 MG per tablet 1 tablet  1 tablet Oral QHS Caroline Sauger, NP   1 tablet at 04/20/22 2140   acetaminophen (TYLENOL) tablet 650 mg  650 mg Oral Q6H PRN Caroline Sauger, NP       albuterol (VENTOLIN HFA) 108 (90 Base) MCG/ACT inhaler 2 puff  2 puff Inhalation Q6H PRN Caroline Sauger, NP  alum & mag hydroxide-simeth (MAALOX/MYLANTA) 200-200-20 MG/5ML suspension 30 mL  30 mL Oral Q4H PRN Caroline Sauger, NP       aspirin tablet 325 mg  325 mg Oral Daily Caroline Sauger, NP   325 mg at 04/21/22 0940   diazepam (VALIUM) tablet 5 mg  5 mg Oral TID Clapacs, Madie Reno, MD   5 mg at 04/21/22 0940   dicyclomine (BENTYL) capsule 10 mg  10 mg Oral TID AC & HS Caroline Sauger, NP   10 mg at 04/21/22 1236   divalproex (DEPAKOTE ER) 24 hr tablet 500 mg  500 mg Oral QHS Caroline Sauger, NP   500 mg at 04/20/22 2140   donepezil (ARICEPT) tablet 5 mg  5 mg Oral Daily Caroline Sauger, NP   5 mg at 04/21/22 0941   fluticasone (FLOVENT HFA) 220 MCG/ACT inhaler 2 puff  2 puff Inhalation BID Caroline Sauger, NP   2 puff at 04/21/22 0936   gabapentin (NEURONTIN) capsule 800 mg  800 mg Oral TID Caroline Sauger, NP   800 mg at 04/21/22 0941   hydrOXYzine (ATARAX) tablet 25 mg  25 mg Oral TID PRN Clapacs, John T, MD   25 mg at 04/21/22 1236   lidocaine (LIDODERM) 5 % 1 patch  1 patch Transdermal Q24H Rulon Sera, MD   1 patch at 04/21/22 1235   losartan (COZAAR) tablet 25 mg  25 mg Oral Daily  Caroline Sauger, NP   25 mg at 04/21/22 0941   magnesium hydroxide (MILK OF MAGNESIA) suspension 30 mL  30 mL Oral Daily PRN Caroline Sauger, NP   30 mL at 04/16/22 1139   memantine (NAMENDA) tablet 10 mg  10 mg Oral BID Caroline Sauger, NP   10 mg at 04/21/22 0941   metFORMIN (GLUCOPHAGE) tablet 1,000 mg  1,000 mg Oral BID WC Caroline Sauger, NP   1,000 mg at 04/21/22 0940   mirtazapine (REMERON) tablet 45 mg  45 mg Oral QHS Clapacs, John T, MD   45 mg at 04/20/22 2140   polyethylene glycol (MIRALAX / GLYCOLAX) packet 17 g  17 g Oral Daily Rulon Sera, MD   17 g at 04/21/22 6606   venlafaxine XR (EFFEXOR-XR) 24 hr capsule 150 mg  150 mg Oral Q breakfast Clapacs, John T, MD   150 mg at 04/21/22 0940   ziprasidone (GEODON) capsule 40 mg  40 mg Oral BID WC Clapacs, Madie Reno, MD   40 mg at 04/21/22 0941    Lab Results: No results found for this or any previous visit (from the past 48 hour(s)).  Blood Alcohol level:  Lab Results  Component Value Date   ETH <10 04/14/2022   ETH <10 30/16/0109    Metabolic Disorder Labs: Lab Results  Component Value Date   HGBA1C 5.8 (H) 04/15/2022   MPG 119.76 04/15/2022   MPG 134.11 07/31/2020   No results found for: "PROLACTIN" Lab Results  Component Value Date   CHOL 154 04/15/2022   TRIG 259 (H) 04/15/2022   HDL 27 (L) 04/15/2022   CHOLHDL 5.7 04/15/2022   VLDL 52 (H) 04/15/2022   LDLCALC 75 04/15/2022   LDLCALC 106 (H) 11/05/2017    Physical Findings: AIMS:  , ,  ,  ,    CIWA:    COWS:     Musculoskeletal: Strength & Muscle Tone: within normal limits Gait & Station: normal Patient leans: N/A  Psychiatric Specialty Exam:  Presentation  General Appearance:  Casual  Eye Contact:  Good  Speech: Clear and Coherent  Speech Volume: Normal  Handedness:No data recorded  Mood and Affect  Mood: Depressed  Affect: Congruent   Thought Process  Thought Processes: Coherent  Descriptions of  Associations:Intact  Orientation:Full (Time, Place and Person)  Thought Content:WDL  History of Schizophrenia/Schizoaffective disorder:Yes  Duration of Psychotic Symptoms:No data recorded Hallucinations:No data recorded Ideas of Reference:None  Suicidal Thoughts:No data recorded Homicidal Thoughts:No data recorded  Sensorium  Memory: Immediate Fair  Judgment: Poor  Insight: Fair   Materials engineer: Fair  Attention Span: Fair  Recall: [employer] of Knowledge: Fair  Language: Fair   Psychomotor Activity  Psychomotor Activity:No data recorded  Assets  Assets: Desire for Improvement; Housing; Resilience   Sleep  Sleep:No data recorded   Physical Exam: Physical Exam Vitals and nursing note reviewed.  Constitutional:      Appearance: Normal appearance. He is normal weight.  Neurological:     General: No focal deficit present.     Mental Status: He is alert and oriented to person, place, and time.  Psychiatric:        Attention and Perception: Attention and perception normal.        Mood and Affect: Mood and affect normal.        Speech: Speech normal.        Behavior: Behavior normal. Behavior is cooperative.        Thought Content: Thought content normal.        Cognition and Memory: Cognition and memory normal.        Judgment: Judgment normal.    Review of Systems  Constitutional: Negative.   HENT: Negative.    Eyes: Negative.   Respiratory: Negative.    Cardiovascular: Negative.   Gastrointestinal: Negative.   Genitourinary: Negative.   Musculoskeletal: Negative.   Skin: Negative.   Neurological: Negative.   Endo/Heme/Allergies: Negative.   Psychiatric/Behavioral:  Positive for depression.    Blood pressure (!) 147/84, pulse 64, temperature 97.9 F (36.6 C), resp. rate 18, SpO2 99 %. There is no height or weight on file to calculate BMI.   Treatment Plan Summary: Daily contact with patient to assess and  evaluate symptoms and progress in treatment, Medication management, and Plan continue current medications.  Parks Ranger, DO 04/21/2022, 1:55 PM

## 2022-04-21 NOTE — Progress Notes (Signed)
Physical Therapy Treatment Patient Details Name: Danny Cook. MRN: [id number] DOB: 09/08/1960 Today's Date: 04/21/2022   History of Present Illness Pt is a 61 y.o. male presenting to ED 10/16 reporting suicidal thoughts (overdosing); recent nausea/vomiting; admitted to geropsychiatric unit.  PMH includes anxiety, CHF, CKD, HIV, htn, MI, schizophrenia paranoid disorder, and stroke.    PT Comments    Pt sitting in day room (watching a movie) upon PT arrival; agreeable to therapy.  During session pt modified independent with transfers and SBA ambulating with RW up to 160 feet (pt with flexed trunk posture in general but this posture increases with fatigue/increased distance ambulating).  Pt tolerated sitting and standing B LE's ex's fairly well.  Will continue to focus on strengthening and progressive functional mobility during hospitalization.   Recommendations for follow up therapy are one component of a multi-disciplinary discharge planning process, led by the attending physician.  Recommendations may be updated based on patient status, additional functional criteria and insurance authorization.  Follow Up Recommendations  Home health PT     Assistance Recommended at Discharge PRN  Patient can return home with the following Assistance with cooking/housework;Assist for transportation   Equipment Recommendations  Rolling walker (2 wheels)    Recommendations for Other Services       Precautions / Restrictions Precautions Precautions: None Restrictions Weight Bearing Restrictions: No     Mobility  Bed Mobility               General bed mobility comments: Deferred    Transfers Overall transfer level: Modified independent Equipment used: Rolling walker (2 wheels)               General transfer comment: no difficulties noted standing from chair x1 trial and from bed x1 trial    Ambulation/Gait Ambulation/Gait assistance: Supervision Gait Distance  (Feet):  (160 feet; 40 feet) Assistive device: Rolling walker (2 wheels)   Gait velocity: decreased     General Gait Details: decreased B LE step length/foot clearance (improved compared to last session); forward flexed posture (d/t chronic back pain) which increased with increased distance ambulating   Stairs             Wheelchair Mobility    Modified Rankin (Stroke Patients Only)       Balance Overall balance assessment: Needs assistance Sitting-balance support: No upper extremity supported, Feet supported Sitting balance-Leahy Scale: Normal Sitting balance - Comments: steady sitting reaching outside BOS   Standing balance support: Bilateral upper extremity supported Standing balance-Leahy Scale: Good Standing balance comment: steady ambulating with RW use               High Level Balance Comments: 5 time sit to stand test: 23.55 seconds            Cognition Arousal/Alertness: Awake/alert Behavior During Therapy: WFL for tasks assessed/performed Overall Cognitive Status: Within Functional Limits for tasks assessed                                 General Comments: Slower to respond        Exercises General Exercises - Lower Extremity Long Arc Quad: AROM, Strengthening, Both, 10 reps, Seated Hip ABduction/ADduction: AROM, Strengthening, Both, 10 reps, Standing (B UE support on RW) Hip Flexion/Marching: AROM, Strengthening, Both, 10 reps, Seated Heel Raises: AROM, Strengthening, Both, 10 reps, Standing (B UE support on RW)    General Comments  Nursing cleared  pt for participation in physical therapy.  Pt agreeable to PT session.      Pertinent Vitals/Pain Pain Assessment Pain Assessment: Faces Faces Pain Scale: Hurts a little bit Pain Location: chronic low back pain Pain Descriptors / Indicators: Aching, Tightness Pain Intervention(s): Limited activity within patient's tolerance, Monitored during session, Repositioned Vitals (HR  and O2 on room air) stable and WFL throughout treatment session.    Home Living                          Prior Function            PT Goals (current goals can now be found in the care plan section) Acute Rehab PT Goals Patient Stated Goal: to improve back pain and ability to perform tasks at home PT Goal Formulation: With patient Time For Goal Achievement: 05/01/22 Potential to Achieve Goals: Fair Progress towards PT goals: Progressing toward goals    Frequency    Min 2X/week      PT Plan Current plan remains appropriate    Co-evaluation              AM-PAC PT "6 Clicks" Mobility   Outcome Measure  Help needed turning from your back to your side while in a flat bed without using bedrails?: None Help needed moving from lying on your back to sitting on the side of a flat bed without using bedrails?: None Help needed moving to and from a bed to a chair (including a wheelchair)?: None Help needed standing up from a chair using your arms (e.g., wheelchair or bedside chair)?: None Help needed to walk in hospital room?: A Little Help needed climbing 3-5 steps with a railing? : A Little 6 Click Score: 22    End of Session   Activity Tolerance: Patient tolerated treatment well Patient left: in chair (sitting in day room watching movie) Nurse Communication: Mobility status;Precautions PT Visit Diagnosis: Other abnormalities of gait and mobility (R26.89);Muscle weakness (generalized) (M62.81);History of falling (Z91.81)     Time: 3709-6438 PT Time Calculation (min) (ACUTE ONLY): 16 min  Charges:  $Therapeutic Exercise: 8-22 mins                     Leitha Bleak, PT 04/21/22, 4:05 PM

## 2022-04-21 NOTE — Progress Notes (Signed)
Patient is alert and oriented times 4. Mood and affect flat. Patient denies any pain at this time. He denies SI, HI, and AVH. Also denies feelings of  depression at this time. He expresses having anxiety 8/10. States he slept good last night. Morning meds given whole by mouth W/O difficulty. Ate breakfast in day room- appetite good. Patient remains on unit with Q15 minute checks in place.

## 2022-04-21 NOTE — Plan of Care (Signed)
  Problem: Education: Goal: Knowledge of General Education information will improve Description: Including pain rating scale, medication(s)/side effects and non-pharmacologic comfort measures 04/21/2022 1329 by Nolon Bussing, RN Outcome: Progressing 04/21/2022 1329 by Nolon Bussing, RN Outcome: Progressing   Problem: Health Behavior/Discharge Planning: Goal: Ability to manage health-related needs will improve 04/21/2022 1329 by Nolon Bussing, RN Outcome: Progressing 04/21/2022 1329 by Nolon Bussing, RN Outcome: Progressing   Problem: Clinical Measurements: Goal: Ability to maintain clinical measurements within normal limits will improve 04/21/2022 1329 by Nolon Bussing, RN Outcome: Progressing 04/21/2022 1329 by Nolon Bussing, RN Outcome: Progressing Goal: Will remain free from infection 04/21/2022 1329 by Nolon Bussing, RN Outcome: Progressing 04/21/2022 1329 by Nolon Bussing, RN Outcome: Progressing Goal: Diagnostic test results will improve 04/21/2022 1329 by Nolon Bussing, RN Outcome: Progressing 04/21/2022 1329 by Nolon Bussing, RN Outcome: Progressing Goal: Respiratory complications will improve 04/21/2022 1329 by Nolon Bussing, RN Outcome: Progressing 04/21/2022 1329 by Nolon Bussing, RN Outcome: Progressing Goal: Cardiovascular complication will be avoided 04/21/2022 1329 by Nolon Bussing, RN Outcome: Progressing 04/21/2022 1329 by Nolon Bussing, RN Outcome: Progressing   Problem: Activity: Goal: Risk for activity intolerance will decrease 04/21/2022 1329 by Nolon Bussing, RN Outcome: Progressing 04/21/2022 1329 by Nolon Bussing, RN Outcome: Progressing   Problem: Nutrition: Goal: Adequate nutrition will be maintained 04/21/2022 1329 by Nolon Bussing, RN Outcome: Progressing 04/21/2022 1329 by Nolon Bussing, RN Outcome: Progressing   Problem: Coping: Goal: Level  of anxiety will decrease 04/21/2022 1329 by Nolon Bussing, RN Outcome: Progressing 04/21/2022 1329 by Nolon Bussing, RN Outcome: Progressing   Problem: Elimination: Goal: Will not experience complications related to bowel motility 04/21/2022 1329 by Nolon Bussing, RN Outcome: Progressing 04/21/2022 1329 by Nolon Bussing, RN Outcome: Progressing Goal: Will not experience complications related to urinary retention 04/21/2022 1329 by Nolon Bussing, RN Outcome: Progressing 04/21/2022 1329 by Nolon Bussing, RN Outcome: Progressing   Problem: Pain Managment: Goal: General experience of comfort will improve 04/21/2022 1329 by Nolon Bussing, RN Outcome: Progressing 04/21/2022 1329 by Nolon Bussing, RN Outcome: Progressing   Problem: Safety: Goal: Ability to remain free from injury will improve 04/21/2022 1329 by Nolon Bussing, RN Outcome: Progressing 04/21/2022 1329 by Nolon Bussing, RN Outcome: Progressing   Problem: Education: Goal: Utilization of techniques to improve thought processes will improve 04/21/2022 1329 by Nolon Bussing, RN Outcome: Progressing 04/21/2022 1329 by Nolon Bussing, RN Outcome: Progressing Goal: Knowledge of the prescribed therapeutic regimen will improve 04/21/2022 1329 by Nolon Bussing, RN Outcome: Progressing 04/21/2022 1329 by Nolon Bussing, RN Outcome: Progressing   Problem: Self-Concept: Goal: Ability to identify factors that promote anxiety will improve 04/21/2022 1329 by Nolon Bussing, RN Outcome: Progressing 04/21/2022 1329 by Nolon Bussing, RN Outcome: Progressing Goal: Level of anxiety will decrease 04/21/2022 1329 by Nolon Bussing, RN Outcome: Progressing 04/21/2022 1329 by Nolon Bussing, RN Outcome: Progressing Goal: Ability to modify response to factors that promote anxiety will improve 04/21/2022 1329 by Nolon Bussing, RN Outcome:  Progressing 04/21/2022 1329 by Nolon Bussing, RN Outcome: Progressing   Problem: Coping: Goal: Ability to identify and develop effective coping behavior will improve 04/21/2022 1329 by Nolon Bussing, RN Outcome: Progressing 04/21/2022 1329 by Nolon Bussing, RN Outcome: Progressing

## 2022-04-22 NOTE — Group Note (Signed)
LCSW Group Therapy Note   Group Date: 04/22/2022 Start Time: 1300 End Time: 1400   Type of Therapy and Topic:  Group Therapy: Boundaries  Participation Level:  Minimal  Description of Group: This group will address the use of boundaries in their personal lives. Patients will explore why boundaries are important, the difference between healthy and unhealthy boundaries, and negative and postive outcomes of different boundaries and will look at how boundaries can be crossed.  Patients will be encouraged to identify current boundaries in their own lives and identify what kind of boundary is being set. Facilitators will guide patients in utilizing problem-solving interventions to address and correct types boundaries being used and to address when no boundary is being used. Understanding and applying boundaries will be explored and addressed for obtaining and maintaining a balanced life. Patients will be encouraged to explore ways to assertively make their boundaries and needs known to significant others in their lives, using other group members and facilitator for role play, support, and feedback.  Therapeutic Goals:  1.  Patient will identify areas in their life where setting clear boundaries could be  used to improve their life.  2.  Patient will identify signs/triggers that a boundary is not being respected. 3.  Patient will identify two ways to set boundaries in order to achieve balance in  their lives: 4.  Patient will demonstrate ability to communicate their needs and set boundaries  through discussion and/or role plays  Summary of Patient Progress:   Patient was present for the entirety of group session. Patient participated in opening and closing remarks. However, patient did not contribute at all to the topic of discussion despite encouraged participation.   Therapeutic Modalities:   Cognitive Behavioral Therapy Solution-Focused Therapy  Larose Kells 04/22/2022  3:49 PM

## 2022-04-22 NOTE — BH Assessment (Signed)
1918 Received patient sitting in the day room alert and watching TV.   2030 Patient is currently denying SI/HI, A/V hallucinations, depression, anxiety. He reports some disappointment about not haven be discharged today. He is scheduled for discharge tomorrow. Will continue to monitor patient for safety.  2115 He continue to be medication compliant.   0330 Patient continue to rest quietly in bed with eyes closed. Will continue to monitor patient for safety.  0630 Patient continue to rest quietly in his bed; he is scheduled for discharge this AM. Will continue to monitor for safety.

## 2022-04-22 NOTE — Progress Notes (Signed)
Patient is alert and oriented times 4. Mood and affect flat. Patient denies any pain at this time. He denies SI, HI, and AVH. Also denies feelings of  depression at this time. He expresses having anxiety 8/10. States he slept good last night. Morning meds given whole by mouth W/O difficulty. Ate breakfast in day room- appetite good. Patient remains on unit with Q15 minute checks in place.

## 2022-04-22 NOTE — BHH Counselor (Signed)
[name] spoke with the patient on difficulties in establishing home health PT/OT.  [name] recommended that patient consider outpatient PT/OT.    Patient DECLINED.  [name], MSW, LCSW 04/22/2022 11:45 AM

## 2022-04-22 NOTE — BHH Counselor (Addendum)
[name] was contacted by [facility] who reports that they can not accept patient without a [employer], [employer] or BCBS pair.  [name] informed that she does not have a pair at this time.  [facility] staff suggested that [name] reach out to [facility] team to see if patient can be paired with another patient.  [name], MSW, LCSW 04/22/2022 9:50 AM      [name] discussed with the patient recommendations for home health PT/OT.  Patient was agreeable to referral for services.  [name] contacted the following: [facility]:  Declined, due to history of using services in the past that was not effective [facility]: Unable to take patient, "I took 17 last week and my office will not let me take anymore [employer] this week.  Also with [employer] we will have to get authorization, which is difficult with a psych diagnosis."  [facility]: Left HIPAA compliant voicemail [facility]:  Will review and follow up with this [name]. [facility]:  [name] was speak to staff but call was disconnected.  [name] returned call and left HIPAA compliant voicemail. [facility]: Left HIPAA compliant voicemail  [name], MSW, LCSW 04/22/2022 9:04 AM

## 2022-04-22 NOTE — Plan of Care (Signed)
  Problem: Education: Goal: Knowledge of General Education information will improve Description: Including pain rating scale, medication(s)/side effects and non-pharmacologic comfort measures Outcome: Progressing   Problem: Health Behavior/Discharge Planning: Goal: Ability to manage health-related needs will improve Outcome: Progressing   Problem: Clinical Measurements: Goal: Ability to maintain clinical measurements within normal limits will improve Outcome: Progressing Goal: Will remain free from infection Outcome: Progressing Goal: Diagnostic test results will improve Outcome: Progressing Goal: Respiratory complications will improve Outcome: Progressing Goal: Cardiovascular complication will be avoided Outcome: Progressing   Problem: Nutrition: Goal: Adequate nutrition will be maintained Outcome: Progressing   Problem: Activity: Goal: Risk for activity intolerance will decrease Outcome: Progressing   Problem: Coping: Goal: Level of anxiety will decrease Outcome: Progressing   Problem: Education: Goal: Utilization of techniques to improve thought processes will improve Outcome: Progressing Goal: Knowledge of the prescribed therapeutic regimen will improve Outcome: Progressing   Problem: Self-Concept: Goal: Ability to identify factors that promote anxiety will improve Outcome: Progressing Goal: Level of anxiety will decrease Outcome: Progressing Goal: Ability to modify response to factors that promote anxiety will improve Outcome: Progressing   Problem: Coping: Goal: Ability to identify and develop effective coping behavior will improve Outcome: Progressing

## 2022-04-22 NOTE — Progress Notes (Signed)
[facility] MD Progress Note  04/22/2022 11:11 AM [name].  MRN:  [id number] Subjective: Danny Cook is seen on rounds.  I told him that social work is trying to set up his PT and OT when he goes home and she is having trouble connecting with service that takes his insurance.  She is working on it.  He has been compliant with his medications.  No side effects.  He will be attending day treatment 3 days a week.  He denies any suicidal ideation.  Principal Problem: Severe recurrent major depression with psychotic features ([place]) Diagnosis: Principal Problem:   Severe recurrent major depression with psychotic features ([place]) Active Problems:   Chronic diastolic heart failure (HCC)   HTN (hypertension)   Diabetes ([place])   HIV positive ([place])  Total Time spent with patient: 15 minutes  Past Psychiatric History:  Past history of chronic mental health problems mostly with depression and also chronic medical illness  Past Medical History:  Past Medical History:  Diagnosis Date   Anemia    Anxiety    Arthritis    CHF (congestive heart failure) ([place])    Chronic kidney disease    Renal Insufficiency Syndrome; Glomerulosclerosis 2013   Depressed    Diabetes mellitus without complication (HCC)    GERD (gastroesophageal reflux disease)    Headache    High cholesterol    History of kidney stones    HIV (human immunodeficiency virus infection) ([place])    Hypertension    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[place])    Myocardial infarction ([place])    Paranoid disorder ([place])    Pneumonia    Schizophrenia, paranoid ([place])    Sleep apnea    Stroke ([place])    mini TIA   TIA (transient ischemic attack)     Past Surgical History:  Procedure Laterality Date   CARDIAC CATHETERIZATION     CATARACT EXTRACTION W/PHACO Right 04/25/2021   Procedure: CATARACT EXTRACTION PHACO AND INTRAOCULAR LENS PLACEMENT ([place]) RIGHT DIABETIC;  Surgeon: Birder Robson, MD;  Location: [facility] ORS;  Service: Ophthalmology;  Laterality: Right;   3.32 0:23.0   CATARACT EXTRACTION W/PHACO Left 09/19/2021   Procedure: CATARACT EXTRACTION PHACO AND INTRAOCULAR LENS PLACEMENT ([place]) LEFT DIABETIC;  Surgeon: Birder Robson, MD;  Location: [facility] ORS;  Service: Ophthalmology;  Laterality: Left;  6.73 0:49.1   COLONOSCOPY WITH PROPOFOL N/A 09/03/2015   Procedure: COLONOSCOPY WITH PROPOFOL;  Surgeon: Lollie Sails, MD;  Location: [facility] ENDOSCOPY;  Service: Endoscopy;  Laterality: N/A;   COLONOSCOPY WITH PROPOFOL N/A 09/17/2020   Procedure: COLONOSCOPY WITH PROPOFOL;  Surgeon: Lesly Rubenstein, MD;  Location: [facility] ENDOSCOPY;  Service: Endoscopy;  Laterality: N/A;  PREFERS 9AM OR LATER DUE TO TRANSPORTATION   DG TEETH FULL     ESOPHAGOGASTRODUODENOSCOPY (EGD) WITH PROPOFOL N/A 07/01/2016   Dr. [name], [facility]. abnormal esophageal motility, suspicious for presbyesophagus, bile gastritis s/p biopsy, non-bleeding erosive gastropathy s/p biopsy, normal duodenum, path with negative H.pylori and +chronic active gastritis   ESOPHAGOGASTRODUODENOSCOPY (EGD) WITH PROPOFOL N/A 09/17/2020   Procedure: ESOPHAGOGASTRODUODENOSCOPY (EGD) WITH PROPOFOL;  Surgeon: Lesly Rubenstein, MD;  Location: [facility] ENDOSCOPY;  Service: Endoscopy;  Laterality: N/A;   EXTRACORPOREAL SHOCK WAVE LITHOTRIPSY Right 11/01/2020   Procedure: EXTRACORPOREAL SHOCK WAVE LITHOTRIPSY (ESWL);  Surgeon: Abbie Sons, MD;  Location: [facility] ORS;  Service: Urology;  Laterality: Right;   LUMBAR LAMINECTOMY/DECOMPRESSION MICRODISCECTOMY N/A 07/15/2021   Procedure: L3-5 DECOMPRESSION;  Surgeon: Meade Maw, MD;  Location: [facility] ORS;  Service: Neurosurgery;  Laterality: N/A;  Family History:  Family History  Problem Relation Age of Onset   Heart attack Mother    Diabetes Mellitus II Mother    Mental illness Mother    CAD Mother    Heart attack Father    CAD Father    Hypertension Father     Social History:  Social History   Substance and Sexual Activity  Alcohol Use No      Social History   Substance and Sexual Activity  Drug Use No   Comment: denies street drugs    Social History   Socioeconomic History   Marital status: Single    Spouse name: Not on file   Number of children: Not on file   Years of education: 11   Highest education level: GED or equivalent  Occupational History   Not on file  Tobacco Use   Smoking status: Never   Smokeless tobacco: Never  Vaping Use   Vaping Use: Never used  Substance and Sexual Activity   Alcohol use: No   Drug use: No    Comment: denies street drugs   Sexual activity: Never  Other Topics Concern   Not on file  Social History Narrative   Not on file   Social Determinants of Health   Financial Resource Strain: Low Risk  (08/12/2017)   Overall Financial Resource Strain (CARDIA)    Difficulty of Paying Living Expenses: Not hard at all  Food Insecurity: Food Insecurity Present (04/15/2022)   Hunger Vital Sign    Worried About [place] in the Last Year: Sometimes true    Ran Out of Food in the Last Year: Sometimes true  Transportation Needs: Unmet Transportation Needs (04/15/2022)   PRAPARE - Hydrologist (Medical): Yes    Lack of Transportation (Non-Medical): Yes  Physical Activity: Inactive (08/12/2017)   Exercise Vital Sign    Days of Exercise per Week: 0 days    Minutes of Exercise per Session: 0 min  Stress: Stress Concern Present (08/12/2017)   [place]    Feeling of Stress : To some extent  Social Connections: Moderately Isolated (08/12/2017)   Social Connection and Isolation Panel [NHANES]    Frequency of Communication with Friends and Family: More than three times a week    Frequency of Social Gatherings with Friends and Family: Three times a week    Attends Religious Services: Never    Active Member of Clubs or Organizations: No    Attends Archivist Meetings: Never     Marital Status: Never married   Additional Social History:                         Sleep: Good  Appetite:  Good  Current Medications: Current Facility-Administered Medications  Medication Dose Route Frequency Provider Last Rate Last Admin   abacavir-dolutegravir-lamiVUDine (TRIUMEQ) 600-50-300 MG per tablet 1 tablet  1 tablet Oral QHS Caroline Sauger, NP   1 tablet at 04/21/22 2119   acetaminophen (TYLENOL) tablet 650 mg  650 mg Oral Q6H PRN Caroline Sauger, NP       albuterol (VENTOLIN HFA) 108 (90 Base) MCG/ACT inhaler 2 puff  2 puff Inhalation Q6H PRN Caroline Sauger, NP       alum & mag hydroxide-simeth (MAALOX/MYLANTA) 200-200-20 MG/5ML suspension 30 mL  30 mL Oral Q4H PRN Caroline Sauger, NP       aspirin tablet 325  mg  325 mg Oral Daily Caroline Sauger, NP   325 mg at 04/22/22 0901   diazepam (VALIUM) tablet 5 mg  5 mg Oral TID Clapacs, John T, MD   5 mg at 04/22/22 0901   dicyclomine (BENTYL) capsule 10 mg  10 mg Oral TID AC & HS Caroline Sauger, NP   10 mg at 04/22/22 0901   divalproex (DEPAKOTE ER) 24 hr tablet 500 mg  500 mg Oral QHS Caroline Sauger, NP   500 mg at 04/21/22 2119   donepezil (ARICEPT) tablet 5 mg  5 mg Oral Daily Caroline Sauger, NP   5 mg at 04/22/22 0901   fluticasone (FLOVENT HFA) 220 MCG/ACT inhaler 2 puff  2 puff Inhalation BID Caroline Sauger, NP   2 puff at 04/22/22 0859   gabapentin (NEURONTIN) capsule 800 mg  800 mg Oral TID Caroline Sauger, NP   800 mg at 04/22/22 0901   hydrOXYzine (ATARAX) tablet 25 mg  25 mg Oral TID PRN Clapacs, Madie Reno, MD   25 mg at 04/21/22 2119   lidocaine (LIDODERM) 5 % 1 patch  1 patch Transdermal Q24H Rulon Sera, MD   1 patch at 04/21/22 1235   losartan (COZAAR) tablet 25 mg  25 mg Oral Daily Caroline Sauger, NP   25 mg at 04/22/22 0901   magnesium hydroxide (MILK OF MAGNESIA) suspension 30 mL  30 mL Oral Daily PRN Caroline Sauger, NP   30 mL at 04/16/22  1139   memantine (NAMENDA) tablet 10 mg  10 mg Oral BID Caroline Sauger, NP   10 mg at 04/22/22 0900   metFORMIN (GLUCOPHAGE) tablet 1,000 mg  1,000 mg Oral BID WC Caroline Sauger, NP   1,000 mg at 04/22/22 0901   mirtazapine (REMERON) tablet 45 mg  45 mg Oral QHS Clapacs, John T, MD   45 mg at 04/21/22 2119   venlafaxine XR (EFFEXOR-XR) 24 hr capsule 150 mg  150 mg Oral Q breakfast Clapacs, John T, MD   150 mg at 04/22/22 0900   ziprasidone (GEODON) capsule 40 mg  40 mg Oral BID WC Clapacs, John T, MD   40 mg at 04/22/22 0900    Lab Results: No results found for this or any previous visit (from the past 48 hour(s)).  Blood Alcohol level:  Lab Results  Component Value Date   ETH <10 04/14/2022   ETH <10 40/98/1191    Metabolic Disorder Labs: Lab Results  Component Value Date   HGBA1C 5.8 (H) 04/15/2022   MPG 119.76 04/15/2022   MPG 134.11 07/31/2020   No results found for: "PROLACTIN" Lab Results  Component Value Date   CHOL 154 04/15/2022   TRIG 259 (H) 04/15/2022   HDL 27 (L) 04/15/2022   CHOLHDL 5.7 04/15/2022   VLDL 52 (H) 04/15/2022   LDLCALC 75 04/15/2022   LDLCALC 106 (H) 11/05/2017    Physical Findings: AIMS:  , ,  ,  ,    CIWA:    COWS:     Musculoskeletal: Strength & Muscle Tone: within normal limits Gait & Station: normal Patient leans: N/A  Psychiatric Specialty Exam:  Presentation  General Appearance:  Casual  Eye Contact: Good  Speech: Clear and Coherent  Speech Volume: Normal  Handedness:No data recorded  Mood and Affect  Mood: Depressed  Affect: Congruent   Thought Process  Thought Processes: Coherent  Descriptions of Associations:Intact  Orientation:Full (Time, Place and Person)  Thought Content:WDL  History of Schizophrenia/Schizoaffective disorder:Yes  Duration of  Psychotic Symptoms:No data recorded Hallucinations:No data recorded Ideas of Reference:None  Suicidal Thoughts:No data recorded Homicidal  Thoughts:No data recorded  Sensorium  Memory: Immediate Fair  Judgment: Poor  Insight: Fair   Materials engineer: Fair  Attention Span: Fair  Recall: [employer] of Knowledge: Fair  Language: Fair   Psychomotor Activity  Psychomotor Activity:No data recorded  Assets  Assets: Desire for Improvement; Housing; Resilience   Sleep  Sleep:No data recorded   Physical Exam: Physical Exam Vitals and nursing note reviewed.  Constitutional:      Appearance: Normal appearance. He is normal weight.  Neurological:     General: No focal deficit present.     Mental Status: He is alert and oriented to person, place, and time.  Psychiatric:        Attention and Perception: Attention and perception normal.        Mood and Affect: Mood and affect normal.        Speech: Speech normal.        Behavior: Behavior normal. Behavior is cooperative.        Thought Content: Thought content normal.        Cognition and Memory: Cognition and memory normal.        Judgment: Judgment normal.    Review of Systems  Constitutional: Negative.   HENT: Negative.    Eyes: Negative.   Respiratory: Negative.    Cardiovascular: Negative.   Gastrointestinal: Negative.   Genitourinary: Negative.   Musculoskeletal: Negative.   Skin: Negative.   Neurological: Negative.   Endo/Heme/Allergies: Negative.   Psychiatric/Behavioral: Negative.     Blood pressure (!) 157/84, pulse 61, temperature 98 F (36.7 C), resp. rate 16, SpO2 100 %. There is no height or weight on file to calculate BMI.   Treatment Plan Summary: Daily contact with patient to assess and evaluate symptoms and progress in treatment, Medication management, and Plan continue current medications.  Social work is working on home health PT and OT.  [place], DO 04/22/2022, 11:11 AM

## 2022-04-22 NOTE — Progress Notes (Signed)
Patient is alert and oriented times 4. Mood and affect appropriate. Patient rates pain as 2/10. He denies SI, HI, and AVH. Patient does endorse feelings of anxiety at this time. Patient states he slept well last night. Evening medicines administered whole by mouth without difficulty. Patient ate snack in day room; appetite was fair. Patient remains on unit with Q15 minute checks in place.

## 2022-04-22 NOTE — Progress Notes (Signed)
Occupational Therapy Treatment Patient Details Name: Danny Cook. MRN: [id number] DOB: 01/05/1961 Today's Date: 04/22/2022   History of present illness Pt is a 61 y.o. male presenting to ED 10/16 reporting suicidal thoughts (overdosing); recent nausea/vomiting; admitted to geropsychiatric unit.  PMH includes anxiety, CHF, CKD, HIV, htn, MI, schizophrenia paranoid disorder, and stroke.   OT comments  Patient sitting EOB upon arrival, very friendly and agreeable to OT services. Patient was able to perform UB strengthening exercises using the red theraband (shoulder elevation , chest pulls, shoulder diagonals and over head pulls 10x each) Patient was able to demonstrate exercises. Patient stated he plans to attend a day program from 8-2:30 Monday-Friday upon discharge and that he is also apart of a peer support program. Patient reports he has 4 staff members from the peer support group that assist him with running errands and grocery shopping. Patient stated he is open to purchasing frozen dinners and was educated on sodium content secondary to hypertension. He also plans to get and use a blister pack for his medication management. Theraband left at nursing station at end of session. Patient left sitting EOB and all needs met.      Recommendations for follow up therapy are one component of a multi-disciplinary discharge planning process, led by the attending physician.  Recommendations may be updated based on patient status, additional functional criteria and insurance authorization.    Follow Up Recommendations  No OT follow up    Assistance Recommended at Discharge Intermittent Supervision/Assistance  Patient can return home with the following  A little help with bathing/dressing/bathroom;Direct supervision/assist for medications management;Assist for transportation;Assistance with cooking/housework   Equipment Recommendations  None recommended by OT       Precautions /  Restrictions Precautions Precautions: None Restrictions Weight Bearing Restrictions: No          Balance Overall balance assessment: Needs assistance Sitting-balance support: No upper extremity supported, Feet supported Sitting balance-Leahy Scale: Normal                                     ADL either performed or assessed with clinical judgement    Extremity/Trunk Assessment Upper Extremity Assessment Upper Extremity Assessment: Overall WFL for tasks assessed   Lower Extremity Assessment Lower Extremity Assessment: Overall WFL for tasks assessed         Cognition Arousal/Alertness: Awake/alert Behavior During Therapy: WFL for tasks assessed/performed Overall Cognitive Status: Within Functional Limits for tasks assessed                                                     Pertinent Vitals/ Pain       Pain Assessment Pain Assessment: No/denies pain   Frequency  Min 2X/week        Progress Toward Goals  OT Goals(current goals can now be found in the care plan section)  Progress towards OT goals: Progressing toward goals  Acute Rehab OT Goals Patient Stated Goal: to return home. OT Goal Formulation: With patient Time For Goal Achievement: 05/01/22 Potential to Achieve Goals: Good  Plan Discharge plan remains appropriate;Frequency remains appropriate       AM-PAC OT "6 Clicks" Daily Activity     Outcome Measure   Help from another person eating meals?: None  Help from another person taking care of personal grooming?: None Help from another person toileting, which includes using toliet, bedpan, or urinal?: None Help from another person bathing (including washing, rinsing, drying)?: A Little Help from another person to put on and taking off regular upper body clothing?: None Help from another person to put on and taking off regular lower body clothing?: None 6 Click Score: 23    End of Session Equipment Utilized During  Treatment: Other (comment) (none)  OT Visit Diagnosis: Unsteadiness on feet (R26.81);Muscle weakness (generalized) (M62.81)   Activity Tolerance Patient tolerated treatment well   Patient Left in bed   Nurse Communication Mobility status        Time: 4920-1007 OT Time Calculation (min): 13 min  Charges: OT General Charges $OT Visit: 1 Visit OT Treatments $Therapeutic Activity: 8-22 mins    Shanekia Latella, OTS 04/22/2022, 3:00 PM

## 2022-04-23 MED ORDER — MIRTAZAPINE 45 MG PO TABS: 45.0000 mg | ORAL_TABLET | Freq: Every day | ORAL | 2 refills | Status: DC

## 2022-04-23 MED ORDER — ZIPRASIDONE HCL 40 MG PO CAPS: 40.0000 mg | ORAL_CAPSULE | Freq: Two times a day (BID) | ORAL | 2 refills | Status: DC

## 2022-04-23 MED ORDER — GABAPENTIN 400 MG PO CAPS: 800.0000 mg | ORAL_CAPSULE | Freq: Three times a day (TID) | ORAL | 2 refills | Status: DC

## 2022-04-23 MED ORDER — DIAZEPAM 5 MG PO TABS: 5.0000 mg | ORAL_TABLET | Freq: Three times a day (TID) | ORAL | 0 refills | Status: DC

## 2022-04-23 MED ORDER — HYDROXYZINE HCL 25 MG PO TABS: 25.0000 mg | ORAL_TABLET | Freq: Three times a day (TID) | ORAL | 0 refills | Status: DC | PRN

## 2022-04-23 MED ORDER — VENLAFAXINE HCL ER 150 MG PO CP24: 150.0000 mg | ORAL_CAPSULE | Freq: Every day | ORAL | 2 refills | Status: DC

## 2022-04-23 MED ORDER — LIDOCAINE 5 % EX PTCH: 1.0000 | MEDICATED_PATCH | CUTANEOUS | 0 refills | Status: DC

## 2022-04-23 NOTE — Progress Notes (Signed)
  [facility] Adult Case Management Discharge Plan :  Will you be returning to the same living situation after discharge:  Yes,  pt reports that he returning home At discharge, do you have transportation home?: Yes,  pt reports that peer support will provide transportation. Do you have the ability to pay for your medications: Yes,  [facility] Medicare.  Release of information consent forms completed and in the chart;  Patient's signature needed at discharge.  Patient to Follow up at:  Follow-up [facility] Follow up.   Why: Appointment 04/24/2022 at 3:00PM with [facility]. Contact information: [place] [address] [place], [place] Behavioral Follow up.   Why: Appointment is scheduled for 04/30/2022 at 10:20AM.  Appointment is face to face.  MUST attend this appointment or cancel with 24 notice due to changes in attendance policy. Contact information: [place] [address] [phone]                 Next level of care provider has access to [place] and Suicide Prevention discussed: Yes,  SPE completed with the patient's brother.     Has patient been referred to the Quitline?: N/A patient is not a smoker  Patient has been referred for addiction treatment: [place], LCSW 04/23/2022, 11:03 AM

## 2022-04-23 NOTE — Progress Notes (Addendum)
Physical Therapy Treatment Patient Details Name: Danny Cook. MRN: [id number] DOB: 1961-06-18 Today's Date: 04/23/2022   History of Present Illness Pt is a 61 y.o. male presenting to ED 10/16 reporting suicidal thoughts (overdosing); recent nausea/vomiting; admitted to geropsychiatric unit.  PMH includes anxiety, CHF, CKD, HIV, htn, MI, schizophrenia paranoid disorder, and stroke.    PT Comments    Pt is making good progress towards goals and is safe to dc from PT caseload at this time. Pt appears to be at baseline level and feels confident to continue HEP from PT/OT services. Pt currently declining PT follow up in OP setting. Safe ambulation performed with RW with occasional cues for posture. Pt reports having walker at home. Improved 5TSTS score from 23.55 to 14.24 this date demonstrating improved power/strength. Reports safe living environment with no stairs and is eager to dc home this date. Will dc in house.   Recommendations for follow up therapy are one component of a multi-disciplinary discharge planning process, led by the attending physician.  Recommendations may be updated based on patient status, additional functional criteria and insurance authorization.  Follow Up Recommendations  No PT follow up     Assistance Recommended at Discharge PRN  Patient can return home with the following Assistance with cooking/housework;Assist for transportation   Equipment Recommendations  None recommended by PT (has DME at home)    Recommendations for Other Services       Precautions / Restrictions Precautions Precautions: None Restrictions Weight Bearing Restrictions: No     Mobility  Bed Mobility Overal bed mobility: Independent             General bed mobility comments: safe technique with ease of transition    Transfers Overall transfer level: Modified independent Equipment used: Rolling walker (2 wheels)               General transfer comment: does  use B arms for lift off. Once standing, safe technique with RW    Ambulation/Gait Ambulation/Gait assistance: Modified independent (Device/Increase time) Gait Distance (Feet): 200 Feet Assistive device: Rolling walker (2 wheels) Gait Pattern/deviations: Step-through pattern       General Gait Details: ambulated with forward flexed posture and cues for keeping head/eyes in forward direction. Safe technique with sequencing of RW   Stairs             Wheelchair Mobility    Modified Rankin (Stroke Patients Only)       Balance Overall balance assessment: Needs assistance Sitting-balance support: No upper extremity supported, Feet supported Sitting balance-Leahy Scale: Normal     Standing balance support: Bilateral upper extremity supported Standing balance-Leahy Scale: Good                              Cognition Arousal/Alertness: Awake/alert Behavior During Therapy: WFL for tasks assessed/performed Overall Cognitive Status: Within Functional Limits for tasks assessed                                          Exercises Other Exercises Other Exercises: 5 time sit to stand performed in 14.24 seconds with B UE    General Comments        Pertinent Vitals/Pain Pain Assessment Pain Assessment: No/denies pain    Home Living  Prior Function            PT Goals (current goals can now be found in the care plan section) Acute Rehab PT Goals Patient Stated Goal: to improve back pain and ability to perform tasks at home PT Goal Formulation: All assessment and education complete, DC therapy Time For Goal Achievement: 04/23/22 Potential to Achieve Goals: Good Progress towards PT goals: Progressing toward goals    Frequency    Min 2X/week      PT Plan Discharge plan needs to be updated    Co-evaluation              AM-PAC PT "6 Clicks" Mobility   Outcome Measure  Help needed turning  from your back to your side while in a flat bed without using bedrails?: None Help needed moving from lying on your back to sitting on the side of a flat bed without using bedrails?: None Help needed moving to and from a bed to a chair (including a wheelchair)?: None Help needed standing up from a chair using your arms (e.g., wheelchair or bedside chair)?: None Help needed to walk in hospital room?: None Help needed climbing 3-5 steps with a railing? : A Little 6 Click Score: 23    End of Session Equipment Utilized During Treatment: Gait belt Activity Tolerance: Patient tolerated treatment well Patient left: in bed Nurse Communication: Mobility status;Precautions PT Visit Diagnosis: Other abnormalities of gait and mobility (R26.89);Muscle weakness (generalized) (M62.81);History of falling (Z91.81)     Time: 6073-7106 PT Time Calculation (min) (ACUTE ONLY): 8 min  Charges:  $Gait Training: 8-22 mins                     Greggory Stallion, PT, DPT, GCS [phone]    [name] 04/23/2022, 9:40 AM

## 2022-04-23 NOTE — [facility] Suicide Risk Assessment (Signed)
[facility] Discharge Suicide Risk Assessment   Principal Problem: Severe recurrent major depression with psychotic features [facility]) Discharge Diagnoses: Principal Problem:   Severe recurrent major depression with psychotic features ([place]) Active Problems:   Chronic diastolic heart failure ([facility])   HTN (hypertension)   Diabetes ([place])   HIV positive ([place])   Total Time spent with patient: 1 hour  Musculoskeletal: Strength & Muscle Tone: within normal limits Gait & Station: normal Patient leans: N/A  Psychiatric Specialty Exam  Presentation  General Appearance:  Casual  Eye Contact: Good  Speech: Clear and Coherent  Speech Volume: Normal  Handedness:No data recorded  Mood and Affect  Mood: Depressed  Duration of Depression Symptoms: Less than two weeks  Affect: Congruent   Thought Process  Thought Processes: Coherent  Descriptions of Associations:Intact  Orientation:Full (Time, Place and Person)  Thought Content:WDL  History of Schizophrenia/Schizoaffective disorder:Yes  Duration of Psychotic Symptoms:No data recorded Hallucinations:No data recorded Ideas of Reference:None  Suicidal Thoughts:No data recorded Homicidal Thoughts:No data recorded  Sensorium  Memory: Immediate Fair  Judgment: Poor  Insight: Fair   Materials engineer: Fair  Attention Span: Fair  Recall: [employer] of Knowledge: Fair  Language: Fair   Psychomotor Activity  Psychomotor Activity:No data recorded  Assets  Assets: Desire for Improvement; Housing; Resilience   Sleep  Sleep:No data recorded   Blood pressure (!) 140/86, pulse (!) 59, temperature 97.6 F (36.4 C), temperature source Oral, resp. rate 14, SpO2 99 %. There is no height or weight on file to calculate BMI.  Mental Status Per Nursing Assessment::   On Admission:  NA  Demographic Factors:  Male, Caucasian, and Living alone  Loss Factors: Decline in physical  health  Historical Factors: NA  Risk Reduction Factors:   NA  Continued Clinical Symptoms:  Depression:   Anhedonia  Cognitive Features That Contribute To Risk:  None    Suicide Risk:  Minimal: No identifiable suicidal ideation.  Patients presenting with no risk factors but with morbid ruminations; may be classified as minimal risk based on the severity of the depressive symptoms   Follow-up [place] Follow up.   Why: Appointment 04/24/2022 at 3:00PM with [facility]. Contact information: [place] [address] [place], [place] Behavioral Follow up.   Why: Appointment is scheduled for 04/30/2022 at 10:20AM.  Appointment is face to face.  MUST attend this appointment or cancel with 24 notice due to changes in attendance policy. Contact information: [place] [address] [phone]                 Plan Of Care/Follow-up recommendations: Dr. Bobbe Medico, DO 04/23/2022, 10:13 AM

## 2022-04-23 NOTE — Discharge Summary (Signed)
Physician Discharge Summary Note  Patient:  Danny Cook. is an 61 y.o., male MRN:  [id number] DOB:  08-Sep-1960 Patient phone:  [phone] (home)  Patient address:   [place] [address],  Total Time spent with patient: 1 hour  Date of Admission:  04/15/2022 Date of Discharge: 04/23/2022  Reason for Admission:  This is a 61 year old patient with a long history of depression who presented to the hospital with complaints of depression and suicidal ideation.  Patient reports that his mood has been very sad and depressed for months and getting worse.  Feels tired all the time.  He specifically complains that because of his depression he has been unable to care for himself.  He has been unable to do anything like to take care of his house feed himself.  He has not been taking his medication for several days.  Patient is currently living in his own apartment independently after having previously lived in a family care home for quite a while.  He says he regrets it now and wishes he could go back to a family care home.  Patient is denying any hallucinations.  Denies homicidal ideation.  Denies intent to act on it in the hospital.  Denies any alcohol or drug abuse.  He is only vaguely aware of what his current medications are.  Denies any new medical issues.  Patient has been cooperative since coming into the hospital.  He states he has a major problem with loneliness.  Only close relative is his brother.  Has little activity outside of his home.  Principal Problem: Severe recurrent major depression with psychotic features [facility]) Discharge Diagnoses: Principal Problem:   Severe recurrent major depression with psychotic features ([place]) Active Problems:   Chronic diastolic heart failure (HCC)   HTN (hypertension)   Diabetes ([place])   HIV positive ([place])   Past Psychiatric History:  Past history of various diagnoses including schizoaffective disorder and schizophrenia but as  noted previously most of his symptoms seem to be major depression sometimes with some associated psychosis but severe impairment from depression and anxiety.  Patient does have a past history of overdoses and suicide attempts.  He seems overall to have done better in supervised environments.  Multiple medical problems including being HIV positive having diabetes and heart failure.  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[place])    Myocardial infarction ([place])    Paranoid disorder ([place])    Pneumonia    Schizophrenia, paranoid ([place])    Sleep apnea    Stroke ([place])    mini TIA   TIA (transient ischemic attack)     Past Surgical History:  Procedure Laterality Date   CARDIAC CATHETERIZATION     CATARACT EXTRACTION W/PHACO Right 04/25/2021   Procedure: CATARACT EXTRACTION PHACO AND INTRAOCULAR LENS PLACEMENT ([place]) RIGHT DIABETIC;  Surgeon: Birder Robson, MD;  Location: [facility] ORS;  Service: Ophthalmology;  Laterality: Right;  3.32 0:23.0   CATARACT EXTRACTION W/PHACO  Left 09/19/2021   Procedure: CATARACT EXTRACTION PHACO AND INTRAOCULAR LENS PLACEMENT (IOC) LEFT DIABETIC;  Surgeon: Birder Robson, MD;  Location: [facility] ORS;  Service: Ophthalmology;  Laterality: Left;  6.73 0:49.1   COLONOSCOPY WITH PROPOFOL N/A 09/03/2015   Procedure: COLONOSCOPY WITH PROPOFOL;  Surgeon: Lollie Sails, MD;  Location: [facility] ENDOSCOPY;  Service: Endoscopy;  Laterality: N/A;   COLONOSCOPY WITH PROPOFOL N/A 09/17/2020   Procedure: COLONOSCOPY WITH PROPOFOL;  Surgeon: Lesly Rubenstein, MD;  Location: [facility] ENDOSCOPY;  Service: Endoscopy;  Laterality: N/A;  PREFERS 9AM OR LATER DUE TO TRANSPORTATION   DG TEETH FULL     ESOPHAGOGASTRODUODENOSCOPY (EGD) WITH PROPOFOL N/A 07/01/2016   Dr. [name], [facility]. abnormal esophageal motility, suspicious for presbyesophagus, bile gastritis s/p biopsy, non-bleeding erosive gastropathy s/p biopsy, normal duodenum, path with negative H.pylori and +chronic active gastritis   ESOPHAGOGASTRODUODENOSCOPY (EGD) WITH PROPOFOL N/A 09/17/2020   Procedure: ESOPHAGOGASTRODUODENOSCOPY (EGD) WITH PROPOFOL;  Surgeon: Lesly Rubenstein, MD;  Location: [facility] ENDOSCOPY;  Service: Endoscopy;  Laterality: N/A;   EXTRACORPOREAL SHOCK WAVE LITHOTRIPSY Right 11/01/2020   Procedure: EXTRACORPOREAL SHOCK WAVE LITHOTRIPSY (ESWL);  Surgeon: Abbie Sons, MD;  Location: [facility] ORS;  Service: Urology;  Laterality: Right;   LUMBAR LAMINECTOMY/DECOMPRESSION MICRODISCECTOMY N/A 07/15/2021   Procedure: L3-5 DECOMPRESSION;  Surgeon: Meade Maw, MD;  Location: [facility] ORS;  Service: Neurosurgery;  Laterality: N/A;   Family History:  Family History  Problem Relation Age of Onset   Heart attack Mother    Diabetes Mellitus II Mother    Mental illness Mother    CAD Mother    Heart attack Father    CAD Father    Hypertension Father    Family Psychiatric  History: Unknown to patient Social History:  Social History   Substance and Sexual Activity  Alcohol Use No     Social History   Substance and Sexual Activity  Drug Use No   Comment: denies street drugs    Social History   Socioeconomic History   Marital status: Single    Spouse name: Not on file   Number of children: Not on file   Years of education: 11   Highest education level: GED or equivalent  Occupational History   Not on file  Tobacco Use   Smoking status: Never   Smokeless tobacco: Never  Vaping Use   Vaping Use: Never used  Substance and Sexual Activity   Alcohol use: No    Drug use: No    Comment: denies street drugs   Sexual activity: Never  Other Topics Concern   Not on file  Social History Narrative   Not on file   Social Determinants of Health   Financial Resource Strain: Low Risk  (08/12/2017)   Overall Financial Resource Strain (CARDIA)    Difficulty of Paying Living Expenses: Not hard at all  Food Insecurity: Food Insecurity Present (04/15/2022)   Hunger Vital Sign    Worried About [place] in the Last Year: Sometimes true    Ran Out of Food in the Last Year: Sometimes true  Transportation Needs: Unmet Transportation Needs (04/15/2022)   PRAPARE - Hydrologist (Medical): Yes    Lack of Transportation (Non-Medical): Yes  Physical Activity: Inactive (08/12/2017)   Exercise Vital Sign    Days of Exercise per Week: 0 days    Minutes of Exercise per Session: 0 min  Stress: Stress Concern Present (08/12/2017)   [place] -  Occupational Stress Questionnaire    Feeling of Stress : To some extent  Social Connections: Moderately Isolated (08/12/2017)   Social Connection and Isolation Panel [NHANES]    Frequency of Communication with Friends and Family: More than three times a week    Frequency of Social Gatherings with Friends and Family: Three times a week    Attends Religious Services: Never    Active Member of Clubs or Organizations: No    Attends Archivist Meetings: Never    Marital Status: Never married    Hospital Course: Stacey is a 61 year old white male who was voluntarily admitted to inpatient psychiatry for worsening depression and suicidal ideation.  He recently moved into his own apartment from a group home and suffers from loneliness.  While on the inpatient unit he was pleasant and cooperative and compliant with his medications.  He was noncompliant with his medications prior to admission.  Medications were restarted and some of them were increased.   Medical occasions that were increased included Valium, Remeron, Neurontin, Effexor XR, and Geodon.  Leonidas suffers from multiple medical problems including HIV and chronic back pain.  He was started on lidocaine patch.  Atarax was added for anxiety.  He was pleasant and cooperative throughout his stay.  PT and OT were consulted and they recommended that he have home health but social work was unable to obtain services.  He was offered outpatient PT and OT and he declined.  On the day of discharge he denied suicidal ideation, homicidal ideation, auditory or visual hallucinations.  His judgment and insight were good.  His mood and affect improved.  Physical Findings: AIMS:  , ,  ,  ,    CIWA:    COWS:     Musculoskeletal: Strength & Muscle Tone: within normal limits Gait & Station: normal Patient leans: N/A   Psychiatric Specialty Exam:  Presentation  General Appearance:  Casual  Eye Contact: Good  Speech: Clear and Coherent  Speech Volume: Normal  Handedness:No data recorded  Mood and Affect  Mood: Depressed  Affect: Congruent   Thought Process  Thought Processes: Coherent  Descriptions of Associations:Intact  Orientation:Full (Time, Place and Person)  Thought Content:WDL  History of Schizophrenia/Schizoaffective disorder:Yes  Duration of Psychotic Symptoms:No data recorded Hallucinations:No data recorded Ideas of Reference:None  Suicidal Thoughts:No data recorded Homicidal Thoughts:No data recorded  Sensorium  Memory: Immediate Fair  Judgment: Poor  Insight: Fair   Materials engineer: Fair  Attention Span: Fair  Recall: [employer] of Knowledge: Fair  Language: Fair   Psychomotor Activity  Psychomotor Activity:No data recorded  Assets  Assets: Desire for Improvement; Housing; Resilience   Sleep  Sleep:No data recorded   Physical Exam: Physical Exam Vitals and nursing note reviewed.  Constitutional:       Appearance: Normal appearance. He is normal weight.  Neurological:     General: No focal deficit present.     Mental Status: He is alert and oriented to person, place, and time.  Psychiatric:        Attention and Perception: Attention and perception normal.        Mood and Affect: Mood and affect normal.        Speech: Speech normal.        Behavior: Behavior normal. Behavior is cooperative.        Thought Content: Thought content normal.        Cognition and Memory: Cognition and memory normal.  Judgment: Judgment normal.    Review of Systems  Constitutional: Negative.   HENT: Negative.    Eyes: Negative.   Respiratory: Negative.    Cardiovascular: Negative.   Gastrointestinal: Negative.   Genitourinary: Negative.   Musculoskeletal: Negative.   Skin: Negative.   Neurological: Negative.   Endo/Heme/Allergies: Negative.   Psychiatric/Behavioral: Negative.     Blood pressure (!) 140/86, pulse (!) 59, temperature 97.6 F (36.4 C), temperature source Oral, resp. rate 14, SpO2 99 %. There is no height or weight on file to calculate BMI.   Social History   Tobacco Use  Smoking Status Never  Smokeless Tobacco Never   Tobacco Cessation:  N/A, patient does not currently use tobacco products   Blood Alcohol level:  Lab Results  Component Value Date   ETH <10 04/14/2022   ETH <10 62/94/7654    Metabolic Disorder Labs:  Lab Results  Component Value Date   HGBA1C 5.8 (H) 04/15/2022   MPG 119.76 04/15/2022   MPG 134.11 07/31/2020   No results found for: "PROLACTIN" Lab Results  Component Value Date   CHOL 154 04/15/2022   TRIG 259 (H) 04/15/2022   HDL 27 (L) 04/15/2022   CHOLHDL 5.7 04/15/2022   VLDL 52 (H) 04/15/2022   LDLCALC 75 04/15/2022   LDLCALC 106 (H) 11/05/2017    See Psychiatric Specialty Exam and Suicide Risk Assessment completed by Attending Physician prior to discharge.  Discharge destination:  Home  Is patient on multiple antipsychotic  therapies at discharge:  No   Has Patient had three or more failed trials of antipsychotic monotherapy by history:  No  Recommended Plan for Multiple Antipsychotic Therapies: NA   Allergies as of 04/23/2022       Reactions   Trazodone And Nefazodone    Affects other meds that they dont work   [employer] Visual merchandiser Disoproxil]    Damages kidneys   Etodolac Rash        Medication List     STOP taking these medications    acetaminophen 650 MG CR tablet Commonly known as: TYLENOL   aspirin 325 MG tablet   gabapentin 600 MG tablet Commonly known as: NEURONTIN Replaced by: gabapentin 400 MG capsule       TAKE these medications      Indication  albuterol 108 (90 Base) MCG/ACT inhaler Commonly known as: VENTOLIN HFA Inhale 2 puffs into the lungs every 6 (six) hours as needed for wheezing or shortness of breath.    benztropine 0.5 MG tablet Commonly known as: COGENTIN Take 1.5 mg by mouth daily. Pt unsure of dose    bismuth subsalicylate 650 PT/46FK suspension Commonly known as: PEPTO BISMOL Take 15 mLs by mouth every 6 (six) hours as needed for indigestion. 1 tbsp    brimonidine-timolol 0.2-0.5 % ophthalmic solution Commonly known as: COMBIGAN Place 1 drop into both eyes every 12 (twelve) hours.    diazepam 5 MG tablet Commonly known as: VALIUM Take 1 tablet (5 mg total) by mouth 3 (three) times daily. What changed:  how much to take when to take this additional instructions    dicyclomine 10 MG capsule Commonly known as: BENTYL Take 10 mg by mouth 4 (four) times daily -  before meals and at bedtime.    divalproex 500 MG 24 hr tablet Commonly known as: DEPAKOTE ER Take 500 mg by mouth at bedtime.  Indication: Mood   docusate sodium 250 MG capsule Commonly known as: COLACE Take 250 mg by mouth 2 (two)  times daily.    donepezil 10 MG tablet Commonly known as: ARICEPT Take 5 mg by mouth daily.    Fanapt 12 MG Tabs Generic drug: Iloperidone Take 12 mg  by mouth 2 (two) times daily.    Fish Oil 1000 MG Caps Take 1,000 mg by mouth daily.    Fluocinolone Acetonide 0.01 % Oil Place 2-4 drops in ear(s) every other day. At bedtime for dry skin    fluticasone 220 MCG/ACT inhaler Commonly known as: Flovent HFA Inhale 2 puffs into the lungs 2 (two) times daily. Rinse out mouth afterwards    gabapentin 400 MG capsule Commonly known as: NEURONTIN Take 2 capsules (800 mg total) by mouth 3 (three) times daily. Replaces: gabapentin 600 MG tablet  Indication: Diabetes with Nerve Disease, Generalized Anxiety Disorder, Neuropathic Pain   gemfibrozil 600 MG tablet Commonly known as: LOPID Take 600 mg by mouth 2 (two) times daily before a meal.    glimepiride 4 MG tablet Commonly known as: AMARYL Take 4 mg by mouth daily with breakfast.    glycopyrrolate 2 MG tablet Commonly known as: ROBINUL Take 0.5 mg by mouth 2 (two) times daily.    guaiFENesin 600 MG 12 hr tablet Commonly known as: MUCINEX Take 600 mg by mouth 2 (two) times daily.    HumaLOG KwikPen 100 UNIT/ML KwikPen Generic drug: insulin lispro Inject 6 Units into the skin 3 (three) times daily. Only takes Humalog if blood sugar > 200. Takes 2 units if > 200. Additional instructions per sliding scale    hydrOXYzine 25 MG tablet Commonly known as: ATARAX Take 1 tablet (25 mg total) by mouth 3 (three) times daily as needed for anxiety.    lidocaine 5 % Commonly known as: LIDODERM Place 1 patch onto the skin daily. Remove & Discard patch within 12 hours or as directed by MD    losartan 25 MG tablet Commonly known as: COZAAR Take 25 mg by mouth daily.    memantine 10 MG tablet Commonly known as: NAMENDA Take 10 mg by mouth 2 (two) times daily.    metFORMIN 1000 MG tablet Commonly known as: GLUCOPHAGE Take 1,000 mg by mouth 2 (two) times daily with a meal.    methocarbamol 500 MG tablet Commonly known as: Robaxin Take 1 tablet (500 mg total) by mouth 4 (four) times  daily.    mirtazapine 45 MG tablet Commonly known as: REMERON Take 1 tablet (45 mg total) by mouth at bedtime. What changed:  medication strength how much to take  Indication: Major Depressive Disorder   mometasone 0.1 % ointment Commonly known as: ELOCON Apply 1 application topically See admin instructions. Apply 4 drops to ear canal at bedtime every other night for itching and dry skin.  Indication: Itching   nitroGLYCERIN 0.4 MG SL tablet Commonly known as: NITROSTAT Place 0.4 mg under the tongue every 5 (five) minutes as needed for chest pain.    omeprazole 40 MG capsule Commonly known as: PRILOSEC Take 40 mg by mouth 2 (two) times daily.    Ozempic (1 MG/DOSE) 4 MG/3ML Sopn Generic drug: Semaglutide (1 MG/DOSE) Inject 2 mg into the skin every Monday.    rosuvastatin 40 MG tablet Commonly known as: CRESTOR Take 40 mg by mouth daily.    senna 8.6 MG Tabs tablet Commonly known as: SENOKOT Take 1 tablet by mouth daily.    sucralfate 1 g tablet Commonly known as: CARAFATE Take 1 g by mouth 4 (four) times daily.  tamsulosin 0.4 MG Caps capsule Commonly known as: FLOMAX Take 0.4 mg by mouth daily.    Tyler Aas FlexTouch 200 UNIT/ML FlexTouch Pen Generic drug: insulin degludec Inject 10 Units into the skin daily.    Triumeq 600-50-300 MG tablet Generic drug: abacavir-dolutegravir-lamiVUDine Take 1 tablet by mouth at bedtime.  Indication: HIV Disease   venlafaxine XR 150 MG 24 hr capsule Commonly known as: EFFEXOR-XR Take 1 capsule (150 mg total) by mouth daily with breakfast. Start taking on: April 24, 2022 What changed:  medication strength how much to take Another medication with the same name was removed. Continue taking this medication, and follow the directions you see here.  Indication: Major Depressive Disorder   ziprasidone 40 MG capsule Commonly known as: GEODON Take 1 capsule (40 mg total) by mouth 2 (two) times daily with a meal. What  changed: when to take this  Indication: Major Depressive Disorder               Durable Medical Equipment  (From admission, onward)           Start     Ordered   04/22/22 1033  For home use only DME Walker rolling  Once       Question Answer Comment  Walker: With [place]   Patient needs a walker to treat with the following condition Immobility      04/22/22 [facility] Follow up.   Why: Appointment 04/24/2022 at 3:00PM with [facility]. Contact information: [place] [address] [place], [place] [facility] Follow up.   Why: Appointment is scheduled for 04/30/2022 at 10:20AM.  Appointment is face to face.  MUST attend this appointment or cancel with 24 notice due to changes in attendance policy. Contact information: [place] [address] [phone]                 Follow-up recommendations:  Dr. Kasandra Knudsen at [facility]    Signed: Parks Ranger, DO 04/23/2022, 10:34 AM

## 2022-04-23 NOTE — Progress Notes (Signed)
No acute events over  night. Medication compliant. Pt noted anxious, stated he is "ready to go home"   04/22/22 2000  Psych Admission Type (Psych Patients Only)  Admission Status Voluntary  Psychosocial Assessment  Patient Complaints Anxiety  Eye Contact Fair  Facial Expression Blank  Affect Appropriate to circumstance  Speech Logical/coherent  Interaction Minimal  Motor Activity Slow  Appearance/Hygiene In scrubs  Behavior Characteristics Cooperative  Mood Pleasant  Thought Process  Coherency WDL  Content WDL  Delusions None reported or observed  Perception WDL  Hallucination None reported or observed  Judgment WDL  Confusion None  Danger to Self  Current suicidal ideation?  (Denies)  Agreement Not to Harm Self Yes  Description of Agreement Verbal  Danger to Others  Danger to Others None reported or observed

## 2022-04-23 NOTE — [facility] Counselor (Signed)
[name] attempted to reach the patient's brother, Maxximus Gotay, brother, [phone] .  [name] left HIPAA compliant voicemail.  [name], MSW, LCSW 04/23/2022 11:07 AM

## 2022-04-23 NOTE — Progress Notes (Signed)
Patient was educated on medications, prescriptions, follow up care, when to call for help/suicide hotline. Pt questions were answered and pt verbalized understanding and did not voice any concerns. Pt's belongings were returned. Patient was not observed to be in distress at time of discharge. Patient denies SI, HI, and AVH. Pt was safely discharged to the [facility]

## 2022-04-23 NOTE — Care Management Important Message (Signed)
Important Message  Patient Details  Name: Danny Cook. MRN: [id number] Date of Birth: Jan 18, 1961   Medicare Important Message Given:  Yes     Rozann Lesches, LCSW 04/23/2022, 11:04 AM

## 2022-05-18 ENCOUNTER — Other Ambulatory Visit: Payer: Self-pay

## 2022-05-18 ENCOUNTER — Emergency Department: Payer: Medicare Other

## 2022-05-18 ENCOUNTER — Emergency Department
Admission: EM | Admit: 2022-05-18 | Discharge: 2022-05-18 | Disposition: A | Discharge status: Home / Self Care | Payer: Medicare Other | Attending: Emergency Medicine | Admitting: Emergency Medicine

## 2022-05-18 DIAGNOSIS — E1122 Type 2 diabetes mellitus with diabetic chronic kidney disease: Secondary | ICD-10-CM | POA: Diagnosis not present

## 2022-05-18 DIAGNOSIS — N189 Chronic kidney disease, unspecified: Secondary | ICD-10-CM | POA: Insufficient documentation

## 2022-05-18 DIAGNOSIS — B370 Candidal stomatitis: Secondary | ICD-10-CM | POA: Diagnosis not present

## 2022-05-18 DIAGNOSIS — I130 Hypertensive heart and chronic kidney disease with heart failure and stage 1 through stage 4 chronic kidney disease, or unspecified chronic kidney disease: Secondary | ICD-10-CM | POA: Diagnosis not present

## 2022-05-18 DIAGNOSIS — R42 Dizziness and giddiness: Secondary | ICD-10-CM

## 2022-05-18 DIAGNOSIS — I509 Heart failure, unspecified: Secondary | ICD-10-CM | POA: Diagnosis not present

## 2022-05-18 DIAGNOSIS — Z21 Asymptomatic human immunodeficiency virus [HIV] infection status: Secondary | ICD-10-CM | POA: Diagnosis not present

## 2022-05-18 DIAGNOSIS — R112 Nausea with vomiting, unspecified: Secondary | ICD-10-CM | POA: Insufficient documentation

## 2022-05-18 DIAGNOSIS — Z794 Long term (current) use of insulin: Secondary | ICD-10-CM | POA: Diagnosis not present

## 2022-05-18 LAB — CBC
HCT: 36.4 % — ABNORMAL LOW (ref 39.0–52.0)
Hemoglobin: 12.6 g/dL — ABNORMAL LOW (ref 13.0–17.0)
MCH: 31.6 pg (ref 26.0–34.0)
MCHC: 34.6 g/dL (ref 30.0–36.0)
MCV: 91.2 fL (ref 80.0–100.0)
Platelets: 198 10*3/uL (ref 150–400)
RBC: 3.99 MIL/uL — ABNORMAL LOW (ref 4.22–5.81)
RDW: 12.9 % (ref 11.5–15.5)
WBC: 9 10*3/uL (ref 4.0–10.5)
nRBC: 0 % (ref 0.0–0.2)

## 2022-05-18 LAB — COMPREHENSIVE METABOLIC PANEL
ALT: 10 U/L (ref 0–44)
AST: 19 U/L (ref 15–41)
Albumin: 3.6 g/dL (ref 3.5–5.0)
Alkaline Phosphatase: 116 U/L (ref 38–126)
Anion gap: 10 (ref 5–15)
BUN: 18 mg/dL (ref 8–23)
CO2: 24 mmol/L (ref 22–32)
Calcium: 9.6 mg/dL (ref 8.9–10.3)
Chloride: 105 mmol/L (ref 98–111)
Creatinine, Ser: 1.1 mg/dL (ref 0.61–1.24)
GFR, Estimated: >60 mL/min (ref >60)
Glucose, Bld: 171 mg/dL — ABNORMAL HIGH (ref 70–99)
Potassium: 4.1 mmol/L (ref 3.5–5.1)
Sodium: 139 mmol/L (ref 135–145)
Total Bilirubin: 0.5 mg/dL (ref 0.3–1.2)
Total Protein: 7.7 g/dL (ref 6.5–8.1)

## 2022-05-18 LAB — URINALYSIS, ROUTINE W REFLEX MICROSCOPIC
Bacteria, UA: NONE SEEN
Bilirubin Urine: NEGATIVE
Glucose, UA: NEGATIVE mg/dL
Hgb urine dipstick: NEGATIVE
Ketones, ur: NEGATIVE mg/dL
Leukocytes,Ua: NEGATIVE
Nitrite: NEGATIVE
Protein, ur: 100 mg/dL — AB
Specific Gravity, Urine: 1.009 (ref 1.005–1.030)
Squamous Epithelial / HPF: NONE SEEN (ref 0–5)
pH: 5 (ref 5.0–8.0)

## 2022-05-18 LAB — TROPONIN I (HIGH SENSITIVITY): Troponin I (High Sensitivity): 7 ng/L (ref <18)

## 2022-05-18 LAB — LIPASE, BLOOD: Lipase: 39 U/L (ref 11–51)

## 2022-05-18 MED ORDER — FLUCONAZOLE 150 MG PO TABS: 150.0000 mg | ORAL_TABLET | Freq: Every day | ORAL | 0 refills | Status: DC

## 2022-05-18 MED ORDER — SODIUM CHLORIDE 0.9 % IV BOLUS
1000.0000 mL | Freq: Once | INTRAVENOUS | Status: AC
  Administered 2022-05-18: 1000 mL via INTRAVENOUS

## 2022-05-18 MED ORDER — FLUCONAZOLE 50 MG PO TABS
150.0000 mg | ORAL_TABLET | Freq: Once | ORAL | Status: AC
  Administered 2022-05-18: 150 mg via ORAL
  Filled 2022-05-18: qty 1

## 2022-05-18 MED ORDER — MECLIZINE HCL 25 MG PO TABS: 25.0000 mg | ORAL_TABLET | Freq: Three times a day (TID) | ORAL | 0 refills | Status: AC | PRN

## 2022-05-18 MED ORDER — MECLIZINE HCL 25 MG PO TABS
25.0000 mg | ORAL_TABLET | Freq: Once | ORAL | Status: AC
  Administered 2022-05-18: 25 mg via ORAL
  Filled 2022-05-18: qty 1

## 2022-05-18 NOTE — Discharge Instructions (Signed)
Follow-up with your regular doctor if not improving 3 days.  Return emergency department if worsening.  Take the medications as prescribed.  Take your regular medications daily.

## 2022-05-18 NOTE — ED Notes (Signed)
Pt to ED for dizziness and lightheadedness since 2 days. Denies unilateral numbness or weakness. Denies chest pain.

## 2022-05-18 NOTE — ED Triage Notes (Signed)
Pt to ED for dizziness and nausea that started yesterday. Nad noted.

## 2022-05-18 NOTE — ED Provider Notes (Signed)
[facility] Provider Note    Event Date/Time   First MD Initiated Contact with Patient 05/18/22 1256     (approximate)   History   Dizziness   HPI  Danny Cook. is a 61 y.o. male with history of HIV, hypertension, schizophrenia, diabetes insulin-dependent, Kaposi sarcoma, CHF, CKD presents emergency department with dizziness for 2 days.  Patient states that it is worse when he goes from sitting to standing.  Had nausea and vomiting associated with dizziness.  States he has been drinking fluids but is unsure if he is dehydrated.  Also thinks he has thrush on his tongue.     Physical Exam   Triage Vital Signs: ED Triage Vitals  Enc Vitals Group     BP 05/18/22 1230 111/64     Pulse Rate 05/18/22 1230 81     Resp 05/18/22 1230 18     Temp 05/18/22 1230 98 F (36.7 C)     Temp src --      SpO2 05/18/22 1230 94 %     Weight 05/18/22 1231 195 lb (88.5 kg)     Height 05/18/22 1231 '5\' 7"'$  (1.702 m)     Head Circumference --      Peak Flow --      Pain Score 05/18/22 1231 7     Pain Loc --      Pain Edu? --      Excl. in [place]? --     Most recent vital signs: Vitals:   05/18/22 1415 05/18/22 1430  BP:  114/68  Pulse: 63 (!) 57  Resp:  16  Temp:    SpO2: 94% 94%     General: Awake, no distress.   CV:  Good peripheral perfusion. regular rate and  rhythm Resp:  Normal effort. Lungs cta Abd:  No distention.   Other:  Cranial nerves II through XII grossly intact, grips equal bilaterally, 5/5 strength lower extremities, no nystagmus noted   ED Results / Procedures / Treatments   Labs (all labs ordered are listed, but only abnormal results are displayed) Labs Reviewed  COMPREHENSIVE METABOLIC PANEL - Abnormal; Notable for the following components:      Result Value   Glucose, Bld 171 (*)    All other components within normal limits  CBC - Abnormal; Notable for the following components:   RBC 3.99 (*)    Hemoglobin 12.6 (*)     HCT 36.4 (*)    All other components within normal limits  URINALYSIS, ROUTINE W REFLEX MICROSCOPIC - Abnormal; Notable for the following components:   Color, Urine STRAW (*)    APPearance CLEAR (*)    Protein, ur 100 (*)    All other components within normal limits  LIPASE, BLOOD  TROPONIN I (HIGH SENSITIVITY)  TROPONIN I (HIGH SENSITIVITY)     EKG  EKG   RADIOLOGY CT of the head    PROCEDURES:   Procedures   MEDICATIONS ORDERED IN ED: Medications  fluconazole (DIFLUCAN) tablet 150 mg (has no administration in time range)  meclizine (ANTIVERT) tablet 25 mg (25 mg Oral Given 05/18/22 1403)  sodium chloride 0.9 % bolus 1,000 mL (1,000 mLs Intravenous New Bag/Given 05/18/22 1358)     IMPRESSION / MDM / ASSESSMENT AND PLAN / ED COURSE  I reviewed the triage vital signs and the nursing notes.  Differential diagnosis includes, but is not limited to, cva, vertigo, tumor, dehydration  Patient's presentation is most consistent with acute presentation with potential threat to life or bodily function.   EKG shows nsr, see physician read  Ct head ordered  Ordered fluids and meclizine   Labs are reassuring  Patient is feeling better after meclizine and fluids, will reassess after ct head  CT of the head independently reviewed and interpreted by me as being negative for any acute abnormality  I did explain the findings to the patient.  Since he is feeling much better will discharge him to home with prescription for Antivert and Diflucan for the oral thrush.  He is to follow-up with his regular doctor in 3 days as needed, return emergency department worsening.  He is in agreement treatment plan.  Discharged stable condition.  FINAL CLINICAL IMPRESSION(S) / ED DIAGNOSES   Final diagnoses:  Dizziness  Vertigo  Thrush     Rx / DC Orders   ED Discharge Orders          Ordered    meclizine (ANTIVERT) 25 MG tablet  3 times daily PRN         05/18/22 1612    fluconazole (DIFLUCAN) 150 MG tablet  Daily        05/18/22 1612             Note:  This document was prepared using Dragon voice recognition software and may include unintentional dictation errors.    Versie Starks, PA-C 05/18/22 1613    Delman Kitten, MD 05/18/22 [phone]

## 2022-06-09 NOTE — Progress Notes (Unsigned)
Patient ID: Danny Fairy., male    DOB: 1960/08/09, 61 y.o.   MRN: [id number]  HPI  Danny Cook is a 61 y/o male with a history of DM, hyperlipidemia, HTN, CKD, HIV, schizophrenia, obstructive sleep apnea, anxiety, osteoarthritis and chronic heart failure.   Echo report from 07/25/20 reviewed and showed an EF of 55-60% along with mild LVH. Echo report from 06/08/18 reviewed and showed an EF of 55-60%. Echo report from 03/25/18 reviewed and showed an EF of 55-60%. Echo report from 07/31/17 reviewed and showed an EF of 55-65%. Echo report from 05/11/17 reviewed and showed an EF of 55% along with mild AR/ TR/ Danny.  Was in the ED 05/18/22 due to dizziness for 2 days along with possible thrush on his tongue. IVF given. Head CT negative. Had a psych admission in October along with 3 other ED visits in October.     He presents today for a follow-up visit with a chief complaint of moderate shortness of breath with minimal exertion. Describes this as chronic in nature. He has associated fatigue, weakness and back pain along with this. He denies any difficulty sleeping, dizziness, abdominal distention, palpitations, pedal edema, chest pain or cough.   Not weighing daily but does have working scales at home.   Continues to live at [facility] and says that he's enjoying living there. Gets his medications pill packed from his pharmacy so is having no problems taking them. Medications get delivered from his pharmacy and he doesn't know what medications that he's taking.   Past Medical History:  Diagnosis Date   Anemia    Anxiety    Arthritis    CHF (congestive heart failure) ([place])    Chronic kidney disease    Renal Insufficiency Syndrome; Glomerulosclerosis 2013   Depressed    Diabetes mellitus without complication (HCC)    GERD (gastroesophageal reflux disease)    Headache    High cholesterol    History of kidney stones    HIV (human immunodeficiency virus infection) ([place])    Hypertension     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[place])    Myocardial infarction ([place])    Paranoid disorder ([place])    Pneumonia    Schizophrenia, paranoid ([place])    Sleep apnea    Stroke ([place])    mini TIA   TIA (transient ischemic attack)    Past Surgical History:  Procedure Laterality Date   CARDIAC CATHETERIZATION     CATARACT EXTRACTION W/PHACO Right 04/25/2021   Procedure: CATARACT EXTRACTION PHACO AND INTRAOCULAR LENS PLACEMENT ([place]) RIGHT DIABETIC;  Surgeon: Birder Robson, MD;  Location: [facility] ORS;  Service: Ophthalmology;  Laterality: Right;  3.32 0:23.0   CATARACT EXTRACTION W/PHACO Left 09/19/2021   Procedure: CATARACT EXTRACTION PHACO AND INTRAOCULAR LENS PLACEMENT ([place]) LEFT DIABETIC;  Surgeon: Birder Robson, MD;  Location: [facility] ORS;  Service: Ophthalmology;  Laterality: Left;  6.73 0:49.1   COLONOSCOPY WITH PROPOFOL N/A 09/03/2015   Procedure: COLONOSCOPY WITH PROPOFOL;  Surgeon: Lollie Sails, MD;  Location: [facility] ENDOSCOPY;  Service: Endoscopy;  Laterality: N/A;   COLONOSCOPY WITH PROPOFOL N/A 09/17/2020   Procedure: COLONOSCOPY WITH PROPOFOL;  Surgeon: Lesly Rubenstein, MD;  Location: [facility] ENDOSCOPY;  Service: Endoscopy;  Laterality: N/A;  PREFERS 9AM OR LATER DUE TO TRANSPORTATION   DG TEETH FULL     ESOPHAGOGASTRODUODENOSCOPY (EGD) WITH PROPOFOL N/A 07/01/2016   Dr. [name], [facility]. abnormal esophageal motility, suspicious for presbyesophagus, bile gastritis s/p biopsy, non-bleeding erosive gastropathy s/p biopsy, normal duodenum, path  with negative H.pylori and +chronic active gastritis   ESOPHAGOGASTRODUODENOSCOPY (EGD) WITH PROPOFOL N/A 09/17/2020   Procedure: ESOPHAGOGASTRODUODENOSCOPY (EGD) WITH PROPOFOL;  Surgeon: Lesly Rubenstein, MD;  Location: [facility] ENDOSCOPY;  Service: Endoscopy;  Laterality: N/A;   EXTRACORPOREAL SHOCK WAVE LITHOTRIPSY Right 11/01/2020   Procedure: EXTRACORPOREAL SHOCK WAVE LITHOTRIPSY (ESWL);  Surgeon: Abbie Sons, MD;  Location: [facility] ORS;  Service:  Urology;  Laterality: Right;   LUMBAR LAMINECTOMY/DECOMPRESSION MICRODISCECTOMY N/A 07/15/2021   Procedure: L3-5 DECOMPRESSION;  Surgeon: Meade Maw, MD;  Location: [facility] ORS;  Service: Neurosurgery;  Laterality: N/A;   Family History  Problem Relation Age of Onset   Heart attack Mother    Diabetes Mellitus II Mother    Mental illness Mother    CAD Mother    Heart attack Father    CAD Father    Hypertension Father    Social History   Tobacco Use   Smoking status: Never   Smokeless tobacco: Never  Substance Use Topics   Alcohol use: No   Allergies  Allergen Reactions   Trazodone And Nefazodone     Affects other meds that they dont work   [employer] Disoproxil]     Damages kidneys   Etodolac Rash   Prior to Admission medications   Medication Sig Start Date End Date Taking? Authorizing Provider  abacavir-dolutegravir-lamiVUDine (TRIUMEQ) 600-50-300 MG tablet Take 1 tablet by mouth at bedtime. 07/21/19  Yes Tsosie Billing, MD  albuterol (PROVENTIL HFA;VENTOLIN HFA) 108 (90 Base) MCG/ACT inhaler Inhale 2 puffs into the lungs every 6 (six) hours as needed for wheezing or shortness of breath. 04/14/18  Yes Loletha Grayer, MD  aspirin EC 325 MG tablet Take 325 mg by mouth daily.   Yes [provider]  benztropine (COGENTIN) 0.5 MG tablet Take 1.5 mg by mouth daily. Pt unsure of dose   Yes [provider]  brimonidine-timolol (COMBIGAN) 0.2-0.5 % ophthalmic solution Place 1 drop into both eyes every 12 (twelve) hours.   Yes [provider]  diazepam (VALIUM) 5 MG tablet Take 1 tablet (5 mg total) by mouth 3 (three) times daily. 04/23/22  Yes Parks Ranger, DO  dicyclomine (BENTYL) 10 MG capsule Take 10 mg by mouth 4 (four) times daily -  before meals and at bedtime.   Yes [provider]  docusate sodium (COLACE) 250 MG capsule Take 250 mg by mouth 2 (two) times daily.   Yes [provider]  donepezil  (ARICEPT) 10 MG tablet Take 5 mg by mouth daily. 09/13/20  Yes [provider]  FANAPT 12 MG TABS Take 12 mg by mouth 2 (two) times daily. 11/14/16  Yes [provider]  Fluocinolone Acetonide 0.01 % OIL Place 2-4 drops in ear(s) every other day. At bedtime for dry skin   Yes [provider]  fluticasone (FLOVENT HFA) 220 MCG/ACT inhaler Inhale 2 puffs into the lungs 2 (two) times daily. Rinse out mouth afterwards 04/14/18  Yes Wieting, Richard, MD  gabapentin (NEURONTIN) 400 MG capsule Take 2 capsules (800 mg total) by mouth 3 (three) times daily. 04/23/22  Yes Parks Ranger, DO  gemfibrozil (LOPID) 600 MG tablet Take 600 mg by mouth 2 (two) times daily before a meal.   Yes [provider]  glycopyrrolate (ROBINUL) 2 MG tablet Take 0.5 mg by mouth 2 (two) times daily. 10/15/20  Yes [provider]  Insulin Degludec (TRESIBA FLEXTOUCH) 200 UNIT/ML SOPN Inject 10 Units into the skin daily. Patient taking differently: Inject  34 Units into the skin daily. 08/19/19  Yes Nolberto Hanlon, MD  losartan (COZAAR) 50 MG tablet Take 50 mg by mouth daily. 02/02/21  Yes [provider]  meclizine (ANTIVERT) 25 MG tablet Take 1 tablet (25 mg total) by mouth 3 (three) times daily as needed for dizziness. 05/18/22  Yes Fisher, Linden Dolin, PA-C  memantine (NAMENDA) 10 MG tablet Take 10 mg by mouth 2 (two) times daily.   Yes [provider]  metFORMIN (GLUCOPHAGE) 1000 MG tablet Take 1,000 mg by mouth 2 (two) times daily with a meal.   Yes [provider]  mirtazapine (REMERON) 45 MG tablet Take 1 tablet (45 mg total) by mouth at bedtime. 04/23/22  Yes Parks Ranger, DO  nitroGLYCERIN (NITROSTAT) 0.4 MG SL tablet Place 0.4 mg under the tongue every 5 (five) minutes as needed for chest pain.   Yes [provider]  Omega-3 Fatty Acids (FISH OIL) 1000 MG CAPS Take 1,000 mg by mouth daily.   Yes [provider]  OZEMPIC, 1  MG/DOSE, 4 MG/3ML SOPN Inject 2 mg into the skin every Monday. 10/10/20  Yes [provider]  rosuvastatin (CRESTOR) 40 MG tablet Take 40 mg by mouth daily.    Yes [provider]  senna (SENOKOT) 8.6 MG TABS tablet Take 1 tablet by mouth daily.   Yes [provider]  sucralfate (CARAFATE) 1 g tablet Take 1 g by mouth 4 (four) times daily. 04/21/18  Yes [provider]  tamsulosin (FLOMAX) 0.4 MG CAPS capsule Take 0.4 mg by mouth daily. 08/02/19  Yes [provider]  venlafaxine XR (EFFEXOR-XR) 150 MG 24 hr capsule Take 1 capsule (150 mg total) by mouth daily with breakfast. 04/24/22  Yes Parks Ranger, DO  ziprasidone (GEODON) 40 MG capsule Take 1 capsule (40 mg total) by mouth 2 (two) times daily with a meal. Patient taking differently: Take 60 mg by mouth 2 (two) times daily with a meal. 04/23/22  Yes Parks Ranger, DO  divalproex (DEPAKOTE ER) 500 MG 24 hr tablet Take 500 mg by mouth at bedtime.  Patient not taking: Reported on 06/10/2022    [provider]  fluconazole (DIFLUCAN) 150 MG tablet Take 1 tablet (150 mg total) by mouth daily. Patient not taking: Reported on 06/10/2022 05/18/22   Versie Starks, PA-C  glimepiride (AMARYL) 4 MG tablet Take 4 mg by mouth daily with breakfast.  Patient not taking: Reported on 06/10/2022 06/06/19   [provider]  hydrOXYzine (ATARAX) 25 MG tablet Take 1 tablet (25 mg total) by mouth 3 (three) times daily as needed for anxiety. Patient not taking: Reported on 06/10/2022 04/23/22   Parks Ranger, DO  methocarbamol (ROBAXIN) 500 MG tablet Take 1 tablet (500 mg total) by mouth 4 (four) times daily. Patient not taking: Reported on 06/10/2022 07/15/21   [name], PA  mometasone (ELOCON) 0.1 % ointment Apply 1 application topically See admin instructions. Apply 4 drops to ear canal at bedtime every other night for itching and dry skin. Patient not taking:  Reported on 06/10/2022    [provider]  omeprazole (PRILOSEC) 40 MG capsule Take 40 mg by mouth 2 (two) times daily.  Patient not taking: Reported on 06/10/2022    [provider]    Review of Systems  Constitutional:  Positive for fatigue (minimal). Negative for appetite change.  HENT:  Positive for rhinorrhea. Negative for congestion, postnasal drip and sore throat.   Eyes:  Negative.   Respiratory:  Positive for shortness of breath. Negative for cough and chest tightness.   Cardiovascular:  Negative for chest pain, palpitations and leg swelling.  Gastrointestinal:  Negative for abdominal distention and abdominal pain.  Endocrine: Negative.   Genitourinary: Negative.   Musculoskeletal:  Positive for back pain (when standing for long periods of time). Negative for neck pain.  Skin: Negative.   Allergic/Immunologic: Negative.   Neurological:  Positive for weakness (leg weakness). Negative for dizziness and light-headedness.  Hematological:  Negative for adenopathy. Does not bruise/bleed easily.  Psychiatric/Behavioral:  Negative for dysphoric mood and sleep disturbance. The patient is not nervous/anxious.    Vitals:   06/10/22 0926  BP: (!) 145/82  Pulse: 67  Resp: 18  SpO2: 99%  Weight: 192 lb (87.1 kg)   Wt Readings from Last 3 Encounters:  06/10/22 192 lb (87.1 kg)  05/18/22 195 lb (88.5 kg)  04/06/22 196 lb 3.4 oz (89 kg)   Lab Results  Component Value Date   CREATININE 1.10 05/18/2022   CREATININE 1.13 04/14/2022   CREATININE 1.40 (H) 04/06/2022   Physical Exam Vitals and nursing note reviewed.  Constitutional:      Appearance: He is well-developed.  HENT:     Head: Normocephalic and atraumatic.  Neck:     Vascular: No JVD.  Cardiovascular:     Rate and Rhythm: Normal rate and regular rhythm.  Pulmonary:     Effort: Pulmonary effort is normal.     Breath sounds: No wheezing or rales.  Abdominal:     General: There is no distension.      Palpations: Abdomen is soft.     Tenderness: There is no abdominal tenderness.  Musculoskeletal:        General: No tenderness.     Cervical back: Normal range of motion and neck supple.     Right lower leg: No tenderness. Edema (trace pitting) present.     Left lower leg: No tenderness. Edema (trace pitting) present.  Skin:    General: Skin is warm and dry.  Neurological:     General: No focal deficit present.     Mental Status: He is alert and oriented to person, place, and time.  Psychiatric:        Mood and Affect: Mood normal.        Behavior: Behavior normal.        Thought Content: Thought content normal.    Assessment & Plan:  1: Chronic heart failure with preserved ejection fraction with structural changes (mild LVH)- - NYHA class III - euvolemic today - not weighing daily but does have scales; reminded to call for an overnight weight gain of >2 pounds or a weekly weight gain of >5 pounds - weight down 11 pounds from last visit here 6 months ago - not adding salt to his food and trying to watch the sodium content of his food - saw cardiology ([facility]) 04/08/22 - echo has been scheduled for 07/03/22; depending on results, could change losartan to entresto and add SGLT2 - BNP 03/31/22 was 48.5 - reports receiving his flu & covid vaccines for this season  2: HTN- - BP mildly elevated (145/82) - BMP from 05/18/22 reviewed and showed sodium 139, potassium 4.1, creatinine 1.10 and GFR >60 - saw PCP Ola Spurr) 05/26/22  3: Diabetes- - A1c 04/15/22 was 5.8% - glucose at home this morning was 184 - saw endocrinologist Jenetta DownerDina Rich) 12/26/21 - saw nephrologist Radene Knee) 02/24/22  4: Depression- -  stable at this time - did have psych admission back in October   Patient did not bring his medications nor a list. Reviewed med list but he's not clear on everything. Tried to reconcile with med list faxed from his pharmacy as well as recent PCP list but there's still some  discrepancy. Will emphasize for him to bring medication pack to next visit  Return in 3 weeks, sooner if needed.

## 2022-06-10 ENCOUNTER — Ambulatory Visit: Payer: Medicare Other | Attending: Family | Admitting: Family

## 2022-06-10 ENCOUNTER — Encounter: Payer: Self-pay | Admitting: Family

## 2022-06-10 VITALS — BP 145/82 | HR 67 | Resp 18 | Wt 192.0 lb

## 2022-06-10 DIAGNOSIS — N189 Chronic kidney disease, unspecified: Secondary | ICD-10-CM | POA: Insufficient documentation

## 2022-06-10 DIAGNOSIS — Z7984 Long term (current) use of oral hypoglycemic drugs: Secondary | ICD-10-CM | POA: Insufficient documentation

## 2022-06-10 DIAGNOSIS — F32A Depression, unspecified: Secondary | ICD-10-CM | POA: Insufficient documentation

## 2022-06-10 DIAGNOSIS — Z794 Long term (current) use of insulin: Secondary | ICD-10-CM

## 2022-06-10 DIAGNOSIS — I5032 Chronic diastolic (congestive) heart failure: Secondary | ICD-10-CM | POA: Insufficient documentation

## 2022-06-10 DIAGNOSIS — Z79899 Other long term (current) drug therapy: Secondary | ICD-10-CM | POA: Insufficient documentation

## 2022-06-10 DIAGNOSIS — Z21 Asymptomatic human immunodeficiency virus [HIV] infection status: Secondary | ICD-10-CM | POA: Insufficient documentation

## 2022-06-10 DIAGNOSIS — N182 Chronic kidney disease, stage 2 (mild): Secondary | ICD-10-CM

## 2022-06-10 DIAGNOSIS — I10 Essential (primary) hypertension: Secondary | ICD-10-CM

## 2022-06-10 DIAGNOSIS — F329 Major depressive disorder, single episode, unspecified: Secondary | ICD-10-CM

## 2022-06-10 DIAGNOSIS — E1122 Type 2 diabetes mellitus with diabetic chronic kidney disease: Secondary | ICD-10-CM

## 2022-06-10 DIAGNOSIS — I130 Hypertensive heart and chronic kidney disease with heart failure and stage 1 through stage 4 chronic kidney disease, or unspecified chronic kidney disease: Secondary | ICD-10-CM | POA: Insufficient documentation

## 2022-06-10 NOTE — Patient Instructions (Addendum)
Resume weighing daily and call for an overnight weight gain of 3 pounds or more or a weekly weight gain of more than 5 pounds.   If you have voicemail, please make sure your mailbox is cleaned out so that we may leave a message and please make sure to listen to any voicemails.     

## 2022-07-03 ENCOUNTER — Ambulatory Visit (HOSPITAL_BASED_OUTPATIENT_CLINIC_OR_DEPARTMENT_OTHER): Payer: Medicare Other | Admitting: Family

## 2022-07-03 ENCOUNTER — Encounter: Payer: Self-pay | Admitting: Family

## 2022-07-03 ENCOUNTER — Ambulatory Visit
Admission: RE | Admit: 2022-07-03 | Discharge: 2022-07-03 | Disposition: A | Discharge status: Home / Self Care | Payer: Medicare Other | Source: Ambulatory Visit | Attending: Family | Admitting: Family

## 2022-07-03 VITALS — BP 138/79 | HR 79 | Resp 18 | Wt 193.2 lb

## 2022-07-03 DIAGNOSIS — Z794 Long term (current) use of insulin: Secondary | ICD-10-CM | POA: Insufficient documentation

## 2022-07-03 DIAGNOSIS — I5032 Chronic diastolic (congestive) heart failure: Secondary | ICD-10-CM | POA: Insufficient documentation

## 2022-07-03 DIAGNOSIS — I130 Hypertensive heart and chronic kidney disease with heart failure and stage 1 through stage 4 chronic kidney disease, or unspecified chronic kidney disease: Secondary | ICD-10-CM | POA: Insufficient documentation

## 2022-07-03 DIAGNOSIS — N182 Chronic kidney disease, stage 2 (mild): Secondary | ICD-10-CM

## 2022-07-03 DIAGNOSIS — E785 Hyperlipidemia, unspecified: Secondary | ICD-10-CM | POA: Insufficient documentation

## 2022-07-03 DIAGNOSIS — E1122 Type 2 diabetes mellitus with diabetic chronic kidney disease: Secondary | ICD-10-CM | POA: Diagnosis not present

## 2022-07-03 DIAGNOSIS — R0602 Shortness of breath: Secondary | ICD-10-CM | POA: Diagnosis not present

## 2022-07-03 DIAGNOSIS — M549 Dorsalgia, unspecified: Secondary | ICD-10-CM | POA: Insufficient documentation

## 2022-07-03 DIAGNOSIS — Z21 Asymptomatic human immunodeficiency virus [HIV] infection status: Secondary | ICD-10-CM | POA: Diagnosis not present

## 2022-07-03 DIAGNOSIS — F329 Major depressive disorder, single episode, unspecified: Secondary | ICD-10-CM | POA: Diagnosis not present

## 2022-07-03 DIAGNOSIS — R5383 Other fatigue: Secondary | ICD-10-CM | POA: Diagnosis present

## 2022-07-03 DIAGNOSIS — N189 Chronic kidney disease, unspecified: Secondary | ICD-10-CM | POA: Insufficient documentation

## 2022-07-03 DIAGNOSIS — F32A Depression, unspecified: Secondary | ICD-10-CM | POA: Diagnosis not present

## 2022-07-03 DIAGNOSIS — R531 Weakness: Secondary | ICD-10-CM | POA: Diagnosis not present

## 2022-07-03 DIAGNOSIS — Z7984 Long term (current) use of oral hypoglycemic drugs: Secondary | ICD-10-CM | POA: Diagnosis not present

## 2022-07-03 DIAGNOSIS — I10 Essential (primary) hypertension: Secondary | ICD-10-CM | POA: Diagnosis not present

## 2022-07-03 DIAGNOSIS — M1990 Unspecified osteoarthritis, unspecified site: Secondary | ICD-10-CM | POA: Insufficient documentation

## 2022-07-03 DIAGNOSIS — R42 Dizziness and giddiness: Secondary | ICD-10-CM | POA: Diagnosis not present

## 2022-07-03 LAB — ECHOCARDIOGRAM COMPLETE: S' Lateral: 2.6 cm

## 2022-07-03 NOTE — Patient Instructions (Signed)
Continue weighing daily and call for an overnight weight gain of 3 pounds or more or a weekly weight gain of more than 5 pounds.   If you have voicemail, please make sure your mailbox is cleaned out so that we may leave a message and please make sure to listen to any voicemails.     

## 2022-07-03 NOTE — Progress Notes (Signed)
Patient ID: Danny Fairy., male    DOB: 10-31-1960, 62 y.o.   MRN: [id number]  HPI  Danny Cook is a 62 y/o male with a history of DM, hyperlipidemia, HTN, CKD, HIV, schizophrenia, obstructive sleep apnea, anxiety, osteoarthritis and chronic heart failure.   Echo report from 07/25/20 reviewed and showed an EF of 55-60% along with mild LVH. Echo report from 06/08/18 reviewed and showed an EF of 55-60%. Echo report from 03/25/18 reviewed and showed an EF of 55-60%. Echo report from 07/31/17 reviewed and showed an EF of 55-65%. Echo report from 05/11/17 reviewed and showed an EF of 55% along with mild AR/ TR/ Danny.  Was in the ED 05/18/22 due to dizziness for 2 days along with possible thrush on his tongue. IVF given. Head CT negative. Had a psych admission in October along with 3 other ED visits in October.     He presents today for a follow-up visit with a chief complaint of moderate fatigue with little exertion. Describes this as chronic in nature. He has associated shortness of breath, back pain, weakness and light-headedness along with this. Denies any difficulty sleeping, abdominal distention, palpitations, pedal edema, chest pain, cough or weight gain.   Had echo done earlier today.  Continues to live at [facility] and says that he's enjoying living there. Gets his medications pill packed from his pharmacy so is having no problems taking them. Medications get delivered from his pharmacy and he doesn't know what medications that he's taking.   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[place])    Myocardial infarction ([place])    Paranoid disorder ([place])    Pneumonia    Schizophrenia, paranoid ([place])    Sleep apnea    Stroke ([place])    mini TIA   TIA (transient ischemic attack)    Past Surgical History:  Procedure Laterality Date   CARDIAC CATHETERIZATION     CATARACT EXTRACTION W/PHACO Right 04/25/2021   Procedure: CATARACT EXTRACTION PHACO AND INTRAOCULAR LENS PLACEMENT ([place]) RIGHT DIABETIC;  Surgeon: Birder Robson, MD;  Location: [facility] ORS;  Service: Ophthalmology;  Laterality: Right;  3.32 0:23.0   CATARACT EXTRACTION W/PHACO Left 09/19/2021   Procedure: CATARACT EXTRACTION PHACO AND INTRAOCULAR LENS PLACEMENT ([place]) LEFT DIABETIC;  Surgeon: Birder Robson, MD;  Location: [facility] ORS;  Service: Ophthalmology;  Laterality: Left;  6.73 0:49.1   COLONOSCOPY WITH PROPOFOL N/A 09/03/2015   Procedure: COLONOSCOPY WITH PROPOFOL;  Surgeon: Lollie Sails, MD;  Location: [facility] ENDOSCOPY;  Service: Endoscopy;  Laterality: N/A;   COLONOSCOPY WITH PROPOFOL N/A 09/17/2020   Procedure: COLONOSCOPY WITH PROPOFOL;  Surgeon: Lesly Rubenstein, MD;  Location: [facility] ENDOSCOPY;  Service: Endoscopy;  Laterality: N/A;  PREFERS 9AM OR LATER DUE TO TRANSPORTATION   DG TEETH FULL     ESOPHAGOGASTRODUODENOSCOPY (EGD) WITH PROPOFOL N/A 07/01/2016   Dr. [name], [facility]. abnormal esophageal motility, suspicious for presbyesophagus, bile gastritis s/p biopsy, non-bleeding erosive gastropathy s/p biopsy, normal duodenum, path with negative H.pylori and +chronic  active gastritis   ESOPHAGOGASTRODUODENOSCOPY (EGD) WITH PROPOFOL N/A 09/17/2020   Procedure: ESOPHAGOGASTRODUODENOSCOPY (EGD) WITH PROPOFOL;  Surgeon: Lesly Rubenstein, MD;  Location: [facility] ENDOSCOPY;  Service: Endoscopy;  Laterality: N/A;   EXTRACORPOREAL SHOCK WAVE LITHOTRIPSY Right 11/01/2020   Procedure: EXTRACORPOREAL SHOCK WAVE LITHOTRIPSY (ESWL);  Surgeon: Abbie Sons, MD;  Location: [facility] ORS;  Service: Urology;   Laterality: Right;   LUMBAR LAMINECTOMY/DECOMPRESSION MICRODISCECTOMY N/A 07/15/2021   Procedure: L3-5 DECOMPRESSION;  Surgeon: Meade Maw, MD;  Location: [facility] ORS;  Service: Neurosurgery;  Laterality: N/A;   Family History  Problem Relation Age of Onset   Heart attack Mother    Diabetes Mellitus II Mother    Mental illness Mother    CAD Mother    Heart attack Father    CAD Father    Hypertension Father    Social History   Tobacco Use   Smoking status: Never   Smokeless tobacco: Never  Substance Use Topics   Alcohol use: No   Allergies  Allergen Reactions   Trazodone And Nefazodone     Affects other meds that they dont work   [employer] Disoproxil]     Damages kidneys   Etodolac Rash   Prior to Admission medications   Medication Sig Start Date End Date Taking? Authorizing Provider  abacavir-dolutegravir-lamiVUDine (TRIUMEQ) 600-50-300 MG tablet Take 1 tablet by mouth at bedtime. 07/21/19  Yes Tsosie Billing, MD  albuterol (PROVENTIL HFA;VENTOLIN HFA) 108 (90 Base) MCG/ACT inhaler Inhale 2 puffs into the lungs every 6 (six) hours as needed for wheezing or shortness of breath. 04/14/18  Yes Loletha Grayer, MD  aspirin EC 325 MG tablet Take 325 mg by mouth daily.   Yes [provider]  benztropine (COGENTIN) 0.5 MG tablet Take 1.5 mg by mouth daily. Pt unsure of dose   Yes [provider]  brimonidine-timolol (COMBIGAN) 0.2-0.5 % ophthalmic solution Place 1 drop into both eyes every 12 (twelve) hours.   Yes [provider]  diazepam (VALIUM) 5 MG tablet Take 1 tablet (5 mg total) by mouth 3 (three) times daily. 04/23/22  Yes Parks Ranger, DO  dicyclomine (BENTYL) 10 MG capsule Take 10 mg by mouth 4 (four) times daily -  before meals and at bedtime.   Yes [provider]  divalproex (DEPAKOTE ER) 500 MG 24 hr tablet Take 500 mg by mouth at bedtime.   Yes [provider]  docusate sodium (COLACE) 250 MG  capsule Take 250 mg by mouth 2 (two) times daily.   Yes [provider]  donepezil (ARICEPT) 10 MG tablet Take 5 mg by mouth daily. 09/13/20  Yes [provider]  fluconazole (DIFLUCAN) 150 MG tablet Take 1 tablet (150 mg total) by mouth daily. 05/18/22  Yes Fisher, Linden Dolin, PA-C  Fluocinolone Acetonide 0.01 % OIL Place 2-4 drops in ear(s) every other day. At bedtime for dry skin   Yes [provider]  fluticasone (FLOVENT HFA) 220 MCG/ACT inhaler Inhale 2 puffs into the lungs 2 (two) times daily. Rinse out mouth afterwards 04/14/18  Yes Wieting, Richard, MD  gabapentin (NEURONTIN) 400 MG capsule Take 2 capsules (800 mg total) by mouth 3 (three) times daily. 04/23/22  Yes Parks Ranger, DO  gemfibrozil (LOPID) 600 MG tablet Take 600 mg by mouth 2 (two) times daily before a meal.   Yes [provider]  glimepiride (AMARYL) 4 MG tablet Take 4 mg by mouth daily with breakfast. 06/06/19  Yes [provider]  glycopyrrolate (ROBINUL) 2 MG tablet Take 0.5 mg by mouth 2 (two) times daily. 10/15/20  Yes [provider]  Insulin Degludec (TRESIBA FLEXTOUCH) 200 UNIT/ML SOPN Inject 10 Units into the skin daily. Patient taking differently: Inject 34 Units into the skin daily. 08/19/19  Yes Nolberto Hanlon, MD  losartan (COZAAR) 50 MG tablet Take 50 mg by mouth daily. 02/02/21  Yes [provider]  meclizine (ANTIVERT) 25 MG tablet Take 1 tablet (25 mg total) by mouth 3 (three) times daily as needed for dizziness. 05/18/22  Yes Fisher, Linden Dolin, PA-C  memantine (NAMENDA) 10 MG tablet Take 10 mg by mouth 2 (two) times daily.   Yes [provider]  metFORMIN (GLUCOPHAGE) 1000 MG tablet Take 1,000 mg by mouth 2 (two) times daily with a meal.   Yes [provider]  mirtazapine (REMERON) 45 MG tablet Take 1 tablet (45 mg total) by mouth at bedtime. 04/23/22  Yes Parks Ranger, DO  nitroGLYCERIN (NITROSTAT) 0.4 MG SL tablet  Place 0.4 mg under the tongue every 5 (five) minutes as needed for chest pain.   Yes [provider]  Omega-3 Fatty Acids (FISH OIL) 1000 MG CAPS Take 1,000 mg by mouth daily.   Yes [provider]  omeprazole (PRILOSEC) 40 MG capsule Take 40 mg by mouth 2 (two) times daily.   Yes [provider]  OZEMPIC, 1 MG/DOSE, 4 MG/3ML SOPN Inject 2 mg into the skin every Monday. 10/10/20  Yes [provider]  rosuvastatin (CRESTOR) 40 MG tablet Take 40 mg by mouth daily.    Yes [provider]  senna (SENOKOT) 8.6 MG TABS tablet Take 1 tablet by mouth daily.   Yes [provider]  sucralfate (CARAFATE) 1 g tablet Take 1 g by mouth 4 (four) times daily. 04/21/18  Yes [provider]  tamsulosin (FLOMAX) 0.4 MG CAPS capsule Take 0.4 mg by mouth daily. 08/02/19  Yes [provider]  venlafaxine XR (EFFEXOR-XR) 150 MG 24 hr capsule Take 1 capsule (150 mg total) by mouth daily with breakfast. 04/24/22  Yes Parks Ranger, DO  ziprasidone (GEODON) 40 MG capsule Take 1 capsule (40 mg total) by mouth 2 (two) times daily with a meal. 04/23/22  Yes Parks Ranger, DO  FANAPT 12 MG TABS Take 12 mg by mouth 2 (two) times daily. Patient not taking: Reported on 07/03/2022 11/14/16   [provider]  hydrOXYzine (ATARAX) 25 MG tablet Take 1 tablet (25 mg total) by mouth 3 (three) times daily as needed for anxiety. Patient not taking: Reported on 06/10/2022 04/23/22   Parks Ranger, DO  methocarbamol (ROBAXIN) 500 MG tablet Take 1 tablet (500 mg total) by mouth 4 (four) times daily. Patient not taking: Reported on 06/10/2022 07/15/21   [name], PA  mometasone (ELOCON) 0.1 % ointment Apply 1 application topically See admin instructions. Apply 4 drops to ear canal at bedtime every other night for itching and dry skin. Patient not taking: Reported on 06/10/2022    [provider]   Review of Systems   Constitutional:  Positive for fatigue. Negative for appetite change.  HENT:  Positive for rhinorrhea. Negative for congestion, postnasal drip and sore throat.   Eyes: Negative.   Respiratory:  Positive for shortness of breath. Negative for cough and chest tightness.   Cardiovascular:  Negative for chest pain, palpitations and leg swelling.  Gastrointestinal:  Negative for abdominal distention and abdominal pain.  Endocrine: Negative.  Genitourinary: Negative.   Musculoskeletal:  Positive for back pain (when standing for long periods of time). Negative for neck pain.  Skin: Negative.   Allergic/Immunologic: Negative.   Neurological:  Positive for weakness (leg weakness) and light-headedness (at times). Negative for dizziness.  Hematological:  Negative for adenopathy. Does not bruise/bleed easily.  Psychiatric/Behavioral:  Negative for dysphoric mood and sleep disturbance (sleeping on 1 pillows). The patient is not nervous/anxious.    Vitals:   07/03/22 1039  BP: 138/79  Pulse: 79  Resp: 18  SpO2: 97%  Weight: 193 lb 4 oz (87.7 kg)   Wt Readings from Last 3 Encounters:  07/03/22 193 lb 4 oz (87.7 kg)  06/10/22 192 lb (87.1 kg)  05/18/22 195 lb (88.5 kg)   Lab Results  Component Value Date   CREATININE 1.10 05/18/2022   CREATININE 1.13 04/14/2022   CREATININE 1.40 (H) 04/06/2022   Physical Exam Vitals and nursing note reviewed.  Constitutional:      Appearance: He is well-developed.  HENT:     Head: Normocephalic and atraumatic.  Neck:     Vascular: No JVD.  Cardiovascular:     Rate and Rhythm: Normal rate and regular rhythm.  Pulmonary:     Effort: Pulmonary effort is normal.     Breath sounds: No wheezing or rales.  Abdominal:     General: There is no distension.     Palpations: Abdomen is soft.     Tenderness: There is no abdominal tenderness.  Musculoskeletal:        General: No tenderness.     Cervical back: Normal range of motion and neck supple.     Right  lower leg: No tenderness. Edema (trace pitting) present.     Left lower leg: No tenderness. Edema (trace pitting) present.  Skin:    General: Skin is warm and dry.  Neurological:     General: No focal deficit present.     Mental Status: He is alert and oriented to person, place, and time.  Psychiatric:        Mood and Affect: Mood normal.        Behavior: Behavior normal.        Thought Content: Thought content normal.    Assessment & Plan:  1: Chronic heart failure with preserved ejection fraction with structural changes (mild LVH)- - NYHA class III - euvolemic today - not weighing daily but does have scales; reminded to call for an overnight weight gain of >2 pounds or a weekly weight gain of >5 pounds - weight up 1.4 pounds from last visit here 3 weeks ago - not adding salt to his food and trying to watch the sodium content of his food - saw cardiology (Paras[facility]) 04/08/22; returns April - echo done earlier today; will call him with results - BNP 03/31/22 was 48.5 - reports receiving his flu & covid vaccines for this season  2: HTN- - BP 138/79 - BMP from 05/18/22 reviewed and showed sodium 139, potassium 4.1, creatinine 1.10 and GFR >60 - saw PCP Ola Spurr) 05/26/22; returns 07/16/22  3: Diabetes- - A1c 04/15/22 was 5.8% - glucose at home this morning was 136 - saw endocrinologist Jenetta DownerDina Rich) 12/26/21 - saw nephrologist Radene Knee) 02/24/22  4: Depression- - stable at this time - did have psych admission back in October 2023   Patient did not bring his medications nor a list. Each medication was verbally reviewed with the patient and he was encouraged to bring the bottles to every  visit to confirm accuracy of list.  Return in 3 months, sooner if needed.

## 2022-07-03 NOTE — Progress Notes (Signed)
*  PRELIMINARY RESULTS* Echocardiogram 2D Echocardiogram has been performed.  Danny Cook 07/03/2022, 10:31 AM

## 2022-07-04 ENCOUNTER — Telehealth: Payer: Self-pay

## 2022-07-04 NOTE — Telephone Encounter (Addendum)
Spoke with patient to review echo results from provider below. Patient verbalized understanding, and had no questions or concerns. Georg Ruddle, RN  ----- Message from Alisa Graff, [place] sent at 07/04/2022  8:33 AM EST ----- Please call and let him know his echo shows a normal pumping ability and it's even slightly better than 2 years ago. Continue all medications.

## 2022-07-10 ENCOUNTER — Emergency Department: Payer: Medicare Other

## 2022-07-10 ENCOUNTER — Emergency Department
Admission: EM | Admit: 2022-07-10 | Discharge: 2022-07-10 | Disposition: A | Discharge status: Home / Self Care | Payer: Medicare Other | Attending: Student in an Organized Health Care Education/Training Program | Admitting: Student in an Organized Health Care Education/Training Program

## 2022-07-10 DIAGNOSIS — N189 Chronic kidney disease, unspecified: Secondary | ICD-10-CM | POA: Diagnosis not present

## 2022-07-10 DIAGNOSIS — I509 Heart failure, unspecified: Secondary | ICD-10-CM | POA: Diagnosis not present

## 2022-07-10 DIAGNOSIS — M25512 Pain in left shoulder: Secondary | ICD-10-CM | POA: Insufficient documentation

## 2022-07-10 DIAGNOSIS — E1122 Type 2 diabetes mellitus with diabetic chronic kidney disease: Secondary | ICD-10-CM | POA: Diagnosis not present

## 2022-07-10 DIAGNOSIS — E860 Dehydration: Secondary | ICD-10-CM

## 2022-07-10 DIAGNOSIS — Z1152 Encounter for screening for COVID-19: Secondary | ICD-10-CM | POA: Insufficient documentation

## 2022-07-10 DIAGNOSIS — R531 Weakness: Secondary | ICD-10-CM | POA: Diagnosis present

## 2022-07-10 LAB — BASIC METABOLIC PANEL
Anion gap: 12 (ref 5–15)
BUN: 29 mg/dL — ABNORMAL HIGH (ref 8–23)
CO2: 21 mmol/L — ABNORMAL LOW (ref 22–32)
Calcium: 9.5 mg/dL (ref 8.9–10.3)
Chloride: 104 mmol/L (ref 98–111)
Creatinine, Ser: 1.36 mg/dL — ABNORMAL HIGH (ref 0.61–1.24)
GFR, Estimated: 58 mL/min — ABNORMAL LOW (ref >60)
Glucose, Bld: 193 mg/dL — ABNORMAL HIGH (ref 70–99)
Potassium: 3.5 mmol/L (ref 3.5–5.1)
Sodium: 137 mmol/L (ref 135–145)

## 2022-07-10 LAB — RESP PANEL BY RT-PCR (RSV, FLU A&B, COVID)  RVPGX2
Influenza A by PCR: NEGATIVE
Influenza B by PCR: NEGATIVE
Resp Syncytial Virus by PCR: NEGATIVE
SARS Coronavirus 2 by RT PCR: NEGATIVE

## 2022-07-10 LAB — URINALYSIS, ROUTINE W REFLEX MICROSCOPIC
Bacteria, UA: NONE SEEN
Bilirubin Urine: NEGATIVE
Glucose, UA: NEGATIVE mg/dL
Hgb urine dipstick: NEGATIVE
Ketones, ur: NEGATIVE mg/dL
Leukocytes,Ua: NEGATIVE
Nitrite: NEGATIVE
Protein, ur: 100 mg/dL — AB
Specific Gravity, Urine: 1.012 (ref 1.005–1.030)
Squamous Epithelial / HPF: NONE SEEN /HPF (ref 0–5)
pH: 5 (ref 5.0–8.0)

## 2022-07-10 LAB — CBC
HCT: 37.3 % — ABNORMAL LOW (ref 39.0–52.0)
Hemoglobin: 12.8 g/dL — ABNORMAL LOW (ref 13.0–17.0)
MCH: 31.1 pg (ref 26.0–34.0)
MCHC: 34.3 g/dL (ref 30.0–36.0)
MCV: 90.8 fL (ref 80.0–100.0)
Platelets: 226 10*3/uL (ref 150–400)
RBC: 4.11 MIL/uL — ABNORMAL LOW (ref 4.22–5.81)
RDW: 12.9 % (ref 11.5–15.5)
WBC: 6.8 10*3/uL (ref 4.0–10.5)
nRBC: 0 % (ref 0.0–0.2)

## 2022-07-10 LAB — HEPATIC FUNCTION PANEL
ALT: 14 U/L (ref 0–44)
AST: 24 U/L (ref 15–41)
Albumin: 3.7 g/dL (ref 3.5–5.0)
Alkaline Phosphatase: 94 U/L (ref 38–126)
Bilirubin, Direct: <0.1 mg/dL (ref 0.0–0.2)
Total Bilirubin: 0.6 mg/dL (ref 0.3–1.2)
Total Protein: 7.2 g/dL (ref 6.5–8.1)

## 2022-07-10 LAB — BRAIN NATRIURETIC PEPTIDE: B Natriuretic Peptide: 20.9 pg/mL (ref 0.0–100.0)

## 2022-07-10 LAB — TROPONIN I (HIGH SENSITIVITY): Troponin I (High Sensitivity): 12 ng/L (ref <18)

## 2022-07-10 MED ORDER — SODIUM CHLORIDE 0.9 % IV BOLUS
500.0000 mL | Freq: Once | INTRAVENOUS | Status: AC
  Administered 2022-07-10: 500 mL via INTRAVENOUS

## 2022-07-10 NOTE — ED Notes (Addendum)
First nurse note: Pt here via [facility] with c/o of weakness and fatigue for one day.   115/72 HR: 80 97.8  CBG 205 95% RA  20G R wrist

## 2022-07-10 NOTE — ED Provider Notes (Signed)
[facility] Provider Note    Event Date/Time   First MD Initiated Contact with Patient 07/10/22 1740     (approximate)   History   Weakness   HPI  Danny Malachi. is a 62 y.o. male   with history of CHF CKD diabetes presents to the ER for evaluation of worried that he is dehydrated and feeling malaise for the past 2 to 3 days.  Denies any cough or congestion no measured fevers.  No abdominal pain.  No diarrhea no dysuria.  No chest pain.  No numbness or tingling no headache.  States he has had some left shoulder pain for the past several weeks no injury.      Physical Exam   Triage Vital Signs: ED Triage Vitals  Enc Vitals Group     BP 07/10/22 1536 110/63     Pulse Rate 07/10/22 1536 89     Resp 07/10/22 1536 17     Temp 07/10/22 1536 (!) 97.5 F (36.4 C)     Temp Source 07/10/22 1536 Oral     SpO2 07/10/22 1536 95 %     Weight 07/10/22 1537 193 lb (87.5 kg)     Height --      Head Circumference --      Peak Flow --      Pain Score 07/10/22 1537 0     Pain Loc --      Pain Edu? --      Excl. in [place]? --     Most recent vital signs: Vitals:   07/10/22 1930 07/10/22 2017  BP: 111/71   Pulse: 65   Resp: 18   Temp:  97.7 F (36.5 C)  SpO2: 97%      Constitutional: Alert  Eyes: Conjunctivae are normal.  Head: Atraumatic. Nose: No congestion/rhinnorhea. Mouth/Throat: Mucous membranes are moist.   Neck: Painless ROM.  Cardiovascular:   Good peripheral circulation. Respiratory: Normal respiratory effort.  No retractions.  Gastrointestinal: Soft and nontender.  Musculoskeletal:  no deformity Neurologic:  MAE spontaneously. No gross focal neurologic deficits are appreciated.  Skin:  Skin is warm, dry and intact. No rash noted. Psychiatric: calm and cooperative    ED Results / Procedures / Treatments   Labs (all labs ordered are listed, but only abnormal results are displayed) Labs Reviewed  BASIC METABOLIC PANEL -  Abnormal; Notable for the following components:      Result Value   CO2 21 (*)    Glucose, Bld 193 (*)    BUN 29 (*)    Creatinine, Ser 1.36 (*)    GFR, Estimated 58 (*)    All other components within normal limits  CBC - Abnormal; Notable for the following components:   RBC 4.11 (*)    Hemoglobin 12.8 (*)    HCT 37.3 (*)    All other components within normal limits  URINALYSIS, ROUTINE W REFLEX MICROSCOPIC - Abnormal; Notable for the following components:   Color, Urine STRAW (*)    APPearance CLEAR (*)    Protein, ur 100 (*)    All other components within normal limits  RESP PANEL BY RT-PCR (RSV, FLU A&B, COVID)  [id number]  HEPATIC FUNCTION PANEL  BRAIN NATRIURETIC PEPTIDE  CBG MONITORING, ED  TROPONIN I (HIGH SENSITIVITY)  TROPONIN I (HIGH SENSITIVITY)     EKG     RADIOLOGY Please see ED Course for my review and interpretation.  I personally reviewed all radiographic images ordered to  evaluate for the above acute complaints and reviewed radiology reports and findings.  These findings were personally discussed with the patient.  Please see medical record for radiology report.    PROCEDURES:  Critical Care performed:   Procedures   MEDICATIONS ORDERED IN ED: Medications  sodium chloride 0.9 % bolus 500 mL (0 mLs Intravenous Stopped 07/10/22 1958)     IMPRESSION / MDM / ASSESSMENT AND PLAN / ED COURSE  I reviewed the triage vital signs and the nursing notes.                              Differential diagnosis includes, but is not limited to, dehydration, electrolyte abnormality, anemia, sepsis, ACS, CHF  Patient presenting to the ER for evaluation of symptoms as described above.  Based on symptoms, risk factors and considered above differential, this presenting complaint could reflect a potentially life-threatening illness therefore the patient will be placed on continuous pulse oximetry and telemetry for monitoring.  Laboratory evaluation will be sent to  evaluate for the above complaints.  Blood work sent for the blood differential.  Will be giving IV fluids due to concern for dehydration   Clinical Course as of 07/10/22 2022  Thu Jul 10, 2022  2021 Viral panels are negative.  LFTs normal.  Troponin negative.  BMP with mild bump in creatinine but received IV fluids with symptomatic improvement.  He is not orthostatic.  X-ray on my review and interpretation without evidence of infiltrate or pneumothorax.  No effusions.  Patient feels well and feels comfortable with outpatient follow-up.  Discussed strict return precautions. [PR]    Clinical Course User Index [PR] Merlyn Lot, MD    FINAL CLINICAL IMPRESSION(S) / ED DIAGNOSES   Final diagnoses:  Dehydration     Rx / DC Orders   ED Discharge Orders     None        Note:  This document was prepared using Dragon voice recognition software and may include unintentional dictation errors.    Merlyn Lot, MD 07/10/22 2022

## 2022-07-10 NOTE — ED Triage Notes (Signed)
Pt sts that he arrived via EMS from [facility]. Pt sts that he has been weak and unable to walk. Pt sts that he thinks that he is dehydrated.

## 2022-07-12 ENCOUNTER — Emergency Department
Admission: EM | Admit: 2022-07-12 | Discharge: 2022-07-13 | Disposition: A | Discharge status: Home / Self Care | Payer: Medicare Other | Attending: Emergency Medicine | Admitting: Emergency Medicine

## 2022-07-12 DIAGNOSIS — F411 Generalized anxiety disorder: Secondary | ICD-10-CM | POA: Diagnosis not present

## 2022-07-12 DIAGNOSIS — F333 Major depressive disorder, recurrent, severe with psychotic symptoms: Secondary | ICD-10-CM | POA: Diagnosis present

## 2022-07-12 DIAGNOSIS — F209 Schizophrenia, unspecified: Secondary | ICD-10-CM | POA: Diagnosis present

## 2022-07-12 DIAGNOSIS — Z1152 Encounter for screening for COVID-19: Secondary | ICD-10-CM | POA: Diagnosis not present

## 2022-07-12 DIAGNOSIS — Z21 Asymptomatic human immunodeficiency virus [HIV] infection status: Secondary | ICD-10-CM | POA: Diagnosis not present

## 2022-07-12 DIAGNOSIS — Z5941 Food insecurity: Secondary | ICD-10-CM | POA: Diagnosis not present

## 2022-07-12 DIAGNOSIS — F99 Mental disorder, not otherwise specified: Secondary | ICD-10-CM

## 2022-07-12 DIAGNOSIS — R45851 Suicidal ideations: Secondary | ICD-10-CM | POA: Diagnosis not present

## 2022-07-12 DIAGNOSIS — F251 Schizoaffective disorder, depressive type: Secondary | ICD-10-CM | POA: Insufficient documentation

## 2022-07-12 DIAGNOSIS — Z79899 Other long term (current) drug therapy: Secondary | ICD-10-CM | POA: Diagnosis not present

## 2022-07-12 DIAGNOSIS — F332 Major depressive disorder, recurrent severe without psychotic features: Secondary | ICD-10-CM

## 2022-07-12 DIAGNOSIS — Z9151 Personal history of suicidal behavior: Secondary | ICD-10-CM | POA: Diagnosis not present

## 2022-07-12 DIAGNOSIS — I10 Essential (primary) hypertension: Secondary | ICD-10-CM | POA: Insufficient documentation

## 2022-07-12 DIAGNOSIS — F5105 Insomnia due to other mental disorder: Secondary | ICD-10-CM | POA: Diagnosis not present

## 2022-07-12 DIAGNOSIS — F32A Depression, unspecified: Secondary | ICD-10-CM

## 2022-07-12 LAB — COMPREHENSIVE METABOLIC PANEL
ALT: 15 U/L (ref 0–44)
AST: 22 U/L (ref 15–41)
Albumin: 4 g/dL (ref 3.5–5.0)
Alkaline Phosphatase: 92 U/L (ref 38–126)
Anion gap: 11 (ref 5–15)
BUN: 23 mg/dL (ref 8–23)
CO2: 23 mmol/L (ref 22–32)
Calcium: 9.6 mg/dL (ref 8.9–10.3)
Chloride: 106 mmol/L (ref 98–111)
Creatinine, Ser: 1.41 mg/dL — ABNORMAL HIGH (ref 0.61–1.24)
GFR, Estimated: 56 mL/min — ABNORMAL LOW (ref >60)
Glucose, Bld: 160 mg/dL — ABNORMAL HIGH (ref 70–99)
Potassium: 4 mmol/L (ref 3.5–5.1)
Sodium: 140 mmol/L (ref 135–145)
Total Bilirubin: 0.5 mg/dL (ref 0.3–1.2)
Total Protein: 7.8 g/dL (ref 6.5–8.1)

## 2022-07-12 LAB — CBG MONITORING, ED: Glucose-Capillary: 156 mg/dL — ABNORMAL HIGH (ref 70–99)

## 2022-07-12 LAB — CBC
HCT: 38.3 % — ABNORMAL LOW (ref 39.0–52.0)
Hemoglobin: 13 g/dL (ref 13.0–17.0)
MCH: 31 pg (ref 26.0–34.0)
MCHC: 33.9 g/dL (ref 30.0–36.0)
MCV: 91.2 fL (ref 80.0–100.0)
Platelets: 213 10*3/uL (ref 150–400)
RBC: 4.2 MIL/uL — ABNORMAL LOW (ref 4.22–5.81)
RDW: 13.1 % (ref 11.5–15.5)
WBC: 6.6 10*3/uL (ref 4.0–10.5)
nRBC: 0 % (ref 0.0–0.2)

## 2022-07-12 LAB — URINE DRUG SCREEN, QUALITATIVE (ARMC ONLY)
Amphetamines, Ur Screen: NOT DETECTED
Barbiturates, Ur Screen: NOT DETECTED
Benzodiazepine, Ur Scrn: POSITIVE — AB
Cannabinoid 50 Ng, Ur ~~LOC~~: NOT DETECTED
Cocaine Metabolite,Ur ~~LOC~~: NOT DETECTED
MDMA (Ecstasy)Ur Screen: NOT DETECTED
Methadone Scn, Ur: NOT DETECTED
Opiate, Ur Screen: NOT DETECTED
Phencyclidine (PCP) Ur S: NOT DETECTED
Tricyclic, Ur Screen: NOT DETECTED

## 2022-07-12 LAB — RESP PANEL BY RT-PCR (RSV, FLU A&B, COVID)  RVPGX2
Influenza A by PCR: NEGATIVE
Influenza B by PCR: NEGATIVE
Resp Syncytial Virus by PCR: NEGATIVE
SARS Coronavirus 2 by RT PCR: NEGATIVE

## 2022-07-12 LAB — SALICYLATE LEVEL: Salicylate Lvl: <7 mg/dL — ABNORMAL LOW (ref 7.0–30.0)

## 2022-07-12 LAB — ETHANOL: Alcohol, Ethyl (B): <10 mg/dL (ref <10)

## 2022-07-12 LAB — ACETAMINOPHEN LEVEL: Acetaminophen (Tylenol), Serum: <10 ug/mL — ABNORMAL LOW (ref 10–30)

## 2022-07-12 NOTE — Consult Note (Signed)
[facility] Consult   Reason for Consult:  Psychiatric evaluation Referring Physician:  Dr. Kerman Passey Patient Identification: Danny Cook. MRN:  [id number] Principal Diagnosis: <principal problem not specified> Diagnosis:  Active Problems:   Schizophrenia ([place])   Schizoaffective disorder, depressive type ([place])   Suicidal ideation   Severe recurrent major depression with psychotic features ([place])   Total Time spent with patient: 45 minutes  Subjective:   "Ive bee really depressed and want to end my life"  HPI:  Danny Cook., 62 y.o., male patient seen by this provider; chart reviewed and consulted with Dr. Kerman Passey on 07/12/22.  On evaluation Danny Cook. reports that he has been feeling very depressed and has been thinking about taking his life.  Today he states he was about to commit suicide but then decided to come to ER instead.  He says he lives alone and his depression and anxiety prohibits him from feeling safe.  He says he is prescribed effexor and seroquel, but admits that he forgets to take his medication often.  He a prior history of SA where he attempted to overdose on "pills".    The patient is at acute elevated risk of suicide and further worsening of psychiatric condition. Risk factors for suicide for this patient include:  current treatment non-compliance, current diagnosis of depression, hopelessness, past suicide attempt, suicide plan with accessible method,  and chronic poor judgment.  Protective factors for this patient are limited to: lack of active SI/HI and motivation for treatment.   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[place])    Myocardial infarction ([place])    Paranoid disorder ([place])    Pneumonia    Schizophrenia, paranoid ([place])    Sleep apnea    Stroke ([place])    mini TIA   TIA (transient ischemic attack)     Past Surgical History:  Procedure Laterality Date   CARDIAC CATHETERIZATION     CATARACT EXTRACTION W/PHACO Right 04/25/2021   Procedure: CATARACT EXTRACTION PHACO AND INTRAOCULAR LENS PLACEMENT ([place]) RIGHT DIABETIC;  Surgeon: Birder Robson, MD;  Location: [facility] ORS;  Service: Ophthalmology;  Laterality: Right;  3.32 0:23.0   CATARACT EXTRACTION W/PHACO Left 09/19/2021   Procedure: CATARACT EXTRACTION PHACO AND INTRAOCULAR LENS PLACEMENT ([place]) LEFT DIABETIC;  Surgeon: Birder Robson, MD;  Location: [facility] ORS;  Service: Ophthalmology;  Laterality: Left;  6.73 0:49.1   COLONOSCOPY WITH PROPOFOL N/A 09/03/2015   Procedure: COLONOSCOPY WITH PROPOFOL;  Surgeon: Lollie Sails, MD;  Location[facility] ENDOSCOPY;  Service: Endoscopy;  Laterality: N/A;   COLONOSCOPY WITH PROPOFOL N/A 09/17/2020   Procedure: COLONOSCOPY WITH PROPOFOL;  Surgeon: Lesly Rubenstein, MD;  Location: [facility] ENDOSCOPY;  Service: Endoscopy;  Laterality: N/A;  PREFERS 9AM OR LATER DUE TO TRANSPORTATION   DG TEETH FULL     ESOPHAGOGASTRODUODENOSCOPY (  EGD) WITH PROPOFOL N/A 07/01/2016   Dr. [name], [facility]. abnormal esophageal motility, suspicious for presbyesophagus, bile gastritis s/p biopsy, non-bleeding erosive gastropathy s/p biopsy, normal duodenum, path with negative H.pylori and +chronic active gastritis   ESOPHAGOGASTRODUODENOSCOPY (EGD) WITH PROPOFOL N/A 09/17/2020   Procedure: ESOPHAGOGASTRODUODENOSCOPY (EGD) WITH PROPOFOL;  Surgeon: Lesly Rubenstein, MD;  Location: [facility] ENDOSCOPY;  Service: Endoscopy;  Laterality: N/A;   EXTRACORPOREAL SHOCK WAVE LITHOTRIPSY Right 11/01/2020   Procedure: EXTRACORPOREAL SHOCK WAVE LITHOTRIPSY (ESWL);  Surgeon: Abbie Sons, MD;  Location: [facility] ORS;  Service: Urology;  Laterality: Right;   LUMBAR LAMINECTOMY/DECOMPRESSION MICRODISCECTOMY N/A 07/15/2021   Procedure: L3-5 DECOMPRESSION;  Surgeon: Meade Maw, MD;  Location: [facility] ORS;  Service: Neurosurgery;  Laterality: N/A;   Family History:  Family History  Problem Relation Age of Onset   Heart attack Mother    Diabetes Mellitus II Mother    Mental illness Mother    CAD Mother    Heart attack Father    CAD Father    Hypertension Father    Family Psychiatric  History:  Social History:  Social History   Substance and Sexual Activity  Alcohol Use No     Social History   Substance and Sexual Activity  Drug Use No   Comment: denies street drugs    Social History   Socioeconomic History   Marital status: Single    Spouse name: Not on file   Number of children: Not on file   Years of education: 11   Highest education level: GED or equivalent  Occupational History   Not on file  Tobacco Use   Smoking status: Never   Smokeless tobacco: Never  Vaping Use   Vaping Use: Never used  Substance and Sexual Activity   Alcohol use: No   Drug use: No    Comment: denies street drugs   Sexual activity: Never  Other Topics Concern   Not on file  Social History Narrative   Not on file   Social Determinants of Health   Financial Resource Strain: Low Risk  (08/12/2017)   Overall Financial Resource Strain (CARDIA)    Difficulty of Paying Living Expenses: Not hard at all  Food Insecurity: Food Insecurity Present (04/15/2022)   Hunger Vital Sign    Worried About [place] in the Last Year: Sometimes true    Ran Out of Food in the Last Year: Sometimes true  Transportation Needs: Unmet Transportation Needs (04/15/2022)    PRAPARE - Hydrologist (Medical): Yes    Lack of Transportation (Non-Medical): Yes  Physical Activity: Inactive (08/12/2017)   Exercise Vital Sign    Days of Exercise per Week: 0 days    Minutes of Exercise per Session: 0 min  Stress: Stress Concern Present (08/12/2017)   [place]    Feeling of Stress : To some extent  Social Connections: Moderately Isolated (08/12/2017)   Social Connection and Isolation Panel [NHANES]    Frequency of Communication with Friends and Family: More than three times a week    Frequency of Social Gatherings with Friends and Family: Three times a week    Attends Religious Services: Never    Active Member of Clubs or Organizations: No    Attends Archivist Meetings: Never    Marital Status: Never married   Additional Social History:    Allergies:   Allergies  Allergen Reactions   Trazodone And  Nefazodone     Affects other meds that they dont work   Viread [Tenofovir Disoproxil]     Damages kidneys   Etodolac Rash    Labs:  Results for orders placed or performed during the hospital encounter of 07/12/22 (from the past 48 hour(s))  Comprehensive metabolic panel     Status: Abnormal   Collection Time: 07/12/22  5:51 PM  Result Value Ref Range   Sodium 140 135 - 145 mmol/L   Potassium 4.0 3.5 - 5.1 mmol/L   Chloride 106 98 - 111 mmol/L   CO2 23 22 - 32 mmol/L   Glucose, Bld 160 (H) 70 - 99 mg/dL    Comment: Glucose reference range applies only to samples taken after fasting for at least 8 hours.   BUN 23 8 - 23 mg/dL   Creatinine, Ser 1.41 (H) 0.61 - 1.24 mg/dL   Calcium 9.6 8.9 - 10.3 mg/dL   Total Protein 7.8 6.5 - 8.1 g/dL   Albumin 4.0 3.5 - 5.0 g/dL   AST 22 15 - 41 U/L   ALT 15 0 - 44 U/L   Alkaline Phosphatase 92 38 - 126 U/L   Total Bilirubin 0.5 0.3 - 1.2 mg/dL   GFR, Estimated 56 (L) >60 mL/min    Comment: (NOTE) Calculated  using the CKD-EPI Creatinine Equation (2021)    Anion gap 11 5 - 15    Comment: Performed at [facility], [address] [place], [place] [address]  Ethanol     Status: None   Collection Time: 07/12/22  5:51 PM  Result Value Ref Range   Alcohol, Ethyl (B) <10 <10 mg/dL    Comment: (NOTE) Lowest detectable limit for serum alcohol is 10 mg/dL.  For medical purposes only. Performed at [facility], [address] [place], [place] [address]   Salicylate level     Status: Abnormal   Collection Time: 07/12/22  5:51 PM  Result Value Ref Range   Salicylate Lvl <3.8 (L) 7.0 - 30.0 mg/dL    Comment: Performed at [facility], [address] [place], [place] [address]  Acetaminophen level     Status: Abnormal   Collection Time: 07/12/22  5:51 PM  Result Value Ref Range   Acetaminophen (Tylenol), Serum <10 (L) 10 - 30 ug/mL    Comment: (NOTE) Therapeutic concentrations vary significantly. A range of 10-30 ug/mL  may be an effective concentration for many patients. However, some  are best treated at concentrations outside of this range. Acetaminophen concentrations >150 ug/mL at 4 hours after ingestion  and >50 ug/mL at 12 hours after ingestion are often associated with  toxic reactions.  Performed at [facility], [address] [place], [place] [address]   cbc     Status: Abnormal   Collection Time: 07/12/22  5:51 PM  Result Value Ref Range   WBC 6.6 4.0 - 10.5 K/uL   RBC 4.20 (L) 4.22 - 5.81 MIL/uL   Hemoglobin 13.0 13.0 - 17.0 g/dL   HCT 38.3 (L) 39.0 - 52.0 %   MCV 91.2 80.0 - 100.0 fL   MCH 31.0 26.0 - 34.0 pg   MCHC 33.9 30.0 - 36.0 g/dL   RDW 13.1 11.5 - 15.5 %   Platelets 213 150 - 400 K/uL   nRBC 0.0 0.0 - 0.2 %    Comment: Performed at [facility], [address], [place], [place] [address]  Urine Drug Screen, Qualitative  Status: Abnormal   Collection Time: 07/12/22  5:51 PM  Result Value Ref Range    Tricyclic, Ur Screen NONE DETECTED NONE DETECTED   Amphetamines, Ur Screen NONE DETECTED NONE DETECTED   MDMA (Ecstasy)Ur Screen NONE DETECTED NONE DETECTED   Cocaine Metabolite,Ur [place] NONE DETECTED NONE DETECTED   Opiate, Ur Screen NONE DETECTED NONE DETECTED   Phencyclidine (PCP) Ur S NONE DETECTED NONE DETECTED   Cannabinoid 50 Ng, Ur [place] NONE DETECTED NONE DETECTED   Barbiturates, Ur Screen NONE DETECTED NONE DETECTED   Benzodiazepine, Ur Scrn POSITIVE (A) NONE DETECTED   Methadone Scn, Ur NONE DETECTED NONE DETECTED    Comment: (NOTE) Tricyclics + metabolites, urine    Cutoff 1000 ng/mL Amphetamines + metabolites, urine  Cutoff 1000 ng/mL MDMA (Ecstasy), urine              Cutoff 500 ng/mL Cocaine Metabolite, urine          Cutoff 300 ng/mL Opiate + metabolites, urine        Cutoff 300 ng/mL Phencyclidine (PCP), urine         Cutoff 25 ng/mL Cannabinoid, urine                 Cutoff 50 ng/mL Barbiturates + metabolites, urine  Cutoff 200 ng/mL Benzodiazepine, urine              Cutoff 200 ng/mL Methadone, urine                   Cutoff 300 ng/mL  The urine drug screen provides only a preliminary, unconfirmed analytical test result and should not be used for non-medical purposes. Clinical consideration and professional judgment should be applied to any positive drug screen result due to possible interfering substances. A more specific alternate chemical method must be used in order to obtain a confirmed analytical result. Gas chromatography / mass spectrometry (GC/MS) is the preferred confirm atory method. Performed at [facility], [address] [place], [place] [address]   Resp panel by RT-PCR (RSV, Flu A&B, Covid) Urine, Clean Catch     Status: None   Collection Time: 07/12/22  5:51 PM   Specimen: Urine, Clean Catch; Nasal Swab  Result Value Ref Range   SARS Coronavirus 2 by RT PCR NEGATIVE NEGATIVE    Comment: (NOTE) SARS-CoV-2 target nucleic acids are NOT  DETECTED.  The SARS-CoV-2 RNA is generally detectable in upper respiratory specimens during the acute phase of infection. The lowest concentration of SARS-CoV-2 viral copies this assay can detect is 138 copies/mL. A negative result does not preclude SARS-Cov-2 infection and should not be used as the sole basis for treatment or other patient management decisions. A negative result may occur with  improper specimen collection/handling, submission of specimen other than nasopharyngeal swab, presence of viral mutation(s) within the areas targeted by this assay, and inadequate number of viral copies(<138 copies/mL). A negative result must be combined with clinical observations, patient history, and epidemiological information. The expected result is Negative.  Fact Sheet for Patients:  EntrepreneurPulse.com.au  Fact Sheet for Healthcare Providers:  IncredibleEmployment.be  This test is no t yet approved or cleared by the [place] FDA and  has been authorized for detection and/or diagnosis of SARS-CoV-2 by FDA under an Emergency Use Authorization (EUA). This EUA will remain  in effect (meaning this test can be used) for the duration of the COVID-19 declaration under Section 564(b)(1) of the Act, 21 U.S.C.section 360bbb-3(b)(1), unless the authorization is terminated  or revoked sooner.       Influenza A by PCR NEGATIVE NEGATIVE   Influenza B by PCR NEGATIVE NEGATIVE    Comment: (NOTE) The Xpert Xpress SARS-CoV-2/FLU/RSV plus assay is intended as an aid in the diagnosis of influenza from Nasopharyngeal swab specimens and should not be used as a sole basis for treatment. Nasal washings and aspirates are unacceptable for Xpert Xpress SARS-CoV-2/FLU/RSV testing.  Fact Sheet for Patients: EntrepreneurPulse.com.au  Fact Sheet for Healthcare Providers: IncredibleEmployment.be  This test is not yet approved or  cleared by the [place] FDA and has been authorized for detection and/or diagnosis of SARS-CoV-2 by FDA under an Emergency Use Authorization (EUA). This EUA will remain in effect (meaning this test can be used) for the duration of the COVID-19 declaration under Section 564(b)(1) of the Act, 21 U.S.C. section 360bbb-3(b)(1), unless the authorization is terminated or revoked.     Resp Syncytial Virus by PCR NEGATIVE NEGATIVE    Comment: (NOTE) Fact Sheet for Patients: EntrepreneurPulse.com.au  Fact Sheet for Healthcare Providers: IncredibleEmployment.be  This test is not yet approved or cleared by the [place] FDA and has been authorized for detection and/or diagnosis of SARS-CoV-2 by FDA under an Emergency Use Authorization (EUA). This EUA will remain in effect (meaning this test can be used) for the duration of the COVID-19 declaration under Section 564(b)(1) of the Act, 21 U.S.C. section 360bbb-3(b)(1), unless the authorization is terminated or revoked.  Performed at [facility], [address] [place], [place] [address]   CBG monitoring, ED     Status: Abnormal   Collection Time: 07/12/22  5:57 PM  Result Value Ref Range   Glucose-Capillary 156 (H) 70 - 99 mg/dL    Comment: Glucose reference range applies only to samples taken after fasting for at least 8 hours.    No current facility-administered medications for this encounter.   Current Outpatient Medications  Medication Sig Dispense Refill   abacavir-dolutegravir-lamiVUDine (TRIUMEQ) 600-50-300 MG tablet Take 1 tablet by mouth at bedtime. 30 tablet 6   aspirin EC 325 MG tablet Take 325 mg by mouth daily.     benztropine (COGENTIN) 0.5 MG tablet Take 1.5 mg by mouth daily. Pt unsure of dose     brimonidine-timolol (COMBIGAN) 0.2-0.5 % ophthalmic solution Place 1 drop into both eyes every 12 (twelve) hours.     diazepam (VALIUM) 5 MG tablet Take 1 tablet (5 mg  total) by mouth 3 (three) times daily. 30 tablet 0   dicyclomine (BENTYL) 10 MG capsule Take 10 mg by mouth 4 (four) times daily -  before meals and at bedtime.     divalproex (DEPAKOTE ER) 500 MG 24 hr tablet Take 500 mg by mouth at bedtime.     docusate sodium (COLACE) 250 MG capsule Take 250 mg by mouth 2 (two) times daily.     donepezil (ARICEPT) 10 MG tablet Take 5 mg by mouth daily.     FANAPT 12 MG TABS Take 12 mg by mouth 2 (two) times daily.     Fluocinolone Acetonide 0.01 % OIL Place 2-4 drops in ear(s) every other day. At bedtime for dry skin     fluticasone (FLOVENT HFA) 220 MCG/ACT inhaler Inhale 2 puffs into the lungs 2 (two) times daily. Rinse out mouth afterwards 1 Inhaler 12   gabapentin (NEURONTIN) 400 MG capsule Take 2 capsules (800 mg total) by mouth 3 (three) times daily. 180 capsule 2   gemfibrozil (LOPID) 600 MG tablet Take 600  mg by mouth 2 (two) times daily before a meal.     glimepiride (AMARYL) 4 MG tablet Take 4 mg by mouth daily with breakfast.     glycopyrrolate (ROBINUL) 2 MG tablet Take 0.5 mg by mouth 2 (two) times daily.     Insulin Degludec (TRESIBA FLEXTOUCH) 200 UNIT/ML SOPN Inject 10 Units into the skin daily. (Patient taking differently: Inject 34 Units into the skin daily.)     losartan (COZAAR) 50 MG tablet Take 50 mg by mouth daily.     memantine (NAMENDA) 10 MG tablet Take 10 mg by mouth 2 (two) times daily.     metFORMIN (GLUCOPHAGE) 1000 MG tablet Take 1,000 mg by mouth 2 (two) times daily with a meal.     mirtazapine (REMERON) 45 MG tablet Take 1 tablet (45 mg total) by mouth at bedtime. 90 tablet 2   Omega-3 Fatty Acids (FISH OIL) 1000 MG CAPS Take 1,000 mg by mouth daily.     omeprazole (PRILOSEC) 40 MG capsule Take 40 mg by mouth 2 (two) times daily.     OZEMPIC, 1 MG/DOSE, 4 MG/3ML SOPN Inject 2 mg into the skin every Monday.     rosuvastatin (CRESTOR) 40 MG tablet Take 40 mg by mouth daily.      senna (SENOKOT) 8.6 MG TABS tablet Take 1 tablet  by mouth daily.     sucralfate (CARAFATE) 1 g tablet Take 1 g by mouth 4 (four) times daily.     tamsulosin (FLOMAX) 0.4 MG CAPS capsule Take 0.4 mg by mouth daily.     venlafaxine XR (EFFEXOR-XR) 150 MG 24 hr capsule Take 1 capsule (150 mg total) by mouth daily with breakfast. 30 capsule 2   ziprasidone (GEODON) 40 MG capsule Take 1 capsule (40 mg total) by mouth 2 (two) times daily with a meal. 60 capsule 2   albuterol (PROVENTIL HFA;VENTOLIN HFA) 108 (90 Base) MCG/ACT inhaler Inhale 2 puffs into the lungs every 6 (six) hours as needed for wheezing or shortness of breath. 1 Inhaler 2   fluconazole (DIFLUCAN) 150 MG tablet Take 1 tablet (150 mg total) by mouth daily. (Patient not taking: Reported on 07/12/2022) 4 tablet 0   hydrOXYzine (ATARAX) 25 MG tablet Take 1 tablet (25 mg total) by mouth 3 (three) times daily as needed for anxiety. (Patient not taking: Reported on 06/10/2022) 30 tablet 0   meclizine (ANTIVERT) 25 MG tablet Take 1 tablet (25 mg total) by mouth 3 (three) times daily as needed for dizziness. 30 tablet 0   methocarbamol (ROBAXIN) 500 MG tablet Take 1 tablet (500 mg total) by mouth 4 (four) times daily. (Patient not taking: Reported on 06/10/2022) 120 tablet 0   mometasone (ELOCON) 0.1 % ointment Apply 1 application topically See admin instructions. Apply 4 drops to ear canal at bedtime every other night for itching and dry skin. (Patient not taking: Reported on 06/10/2022)     nitroGLYCERIN (NITROSTAT) 0.4 MG SL tablet Place 0.4 mg under the tongue every 5 (five) minutes as needed for chest pain. (Patient not taking: Reported on 07/12/2022)      Musculoskeletal: Strength & Muscle Tone: within normal limits Gait & Station: normal Patient leans: N/A   Psychiatric Specialty Exam:  Presentation  General Appearance:  Appropriate for Environment  Eye Contact: Fair  Speech: Clear and Coherent  Speech Volume: Decreased  Handedness: Right   Mood and Affect   Mood: Depressed; Dysphoric; Worthless; Hopeless  Affect: Depressed; Flat   Thought Process  Thought Processes: Coherent  Descriptions of Associations:Intact  Orientation:Full (Time, Place and Person)  Thought Content:WDL  History of Schizophrenia/Schizoaffective disorder:Yes  Duration of Psychotic Symptoms:No data recorded Hallucinations:Hallucinations: None  Ideas of Reference:None  Suicidal Thoughts:Suicidal Thoughts: Yes, Active SI Active Intent and/or Plan: With Intent  Homicidal Thoughts:Homicidal Thoughts: No   Sensorium  Memory: Immediate Fair  Judgment: Impaired  Insight: Fair   Materials engineer: Fair  Attention Span: Fair  Recall: [employer] of Knowledge: Fair  Language: Fair   Psychomotor Activity  Psychomotor Activity: Psychomotor Activity: Decreased   Assets  Assets: Armed forces logistics/support/administrative officer; Desire for Improvement; Housing   Sleep  Sleep: Sleep: Poor   Physical Exam: Physical Exam Vitals and nursing note reviewed.  HENT:     Head: Normocephalic and atraumatic.     Nose: Nose normal.     Mouth/Throat:     Mouth: Mucous membranes are dry.  Pulmonary:     Effort: Pulmonary effort is normal.  Musculoskeletal:        General: Normal range of motion.     Cervical back: Normal range of motion and neck supple.  Skin:    General: Skin is dry.  Neurological:     General: No focal deficit present.     Mental Status: He is alert and oriented to person, place, and time.  Psychiatric:        Attention and Perception: Attention and perception normal.        Mood and Affect: Mood is anxious and depressed. Affect is flat and tearful.        Speech: Speech normal.        Behavior: Behavior is withdrawn. Behavior is cooperative.        Cognition and Memory: Cognition and memory normal.        Judgment: Judgment is impulsive and inappropriate.    Review of Systems  Psychiatric/Behavioral:  Positive for  depression and suicidal ideas. Negative for hallucinations, memory loss and substance abuse. The patient is nervous/anxious and has insomnia.   All other systems reviewed and are negative.  Blood pressure 124/67, pulse 65, temperature 98.3 F (36.8 C), temperature source Oral, resp. rate 16, height '5\' 7"'$  (1.702 m), weight 87.5 kg, SpO2 97 %. Body mass index is 30.23 kg/m.  Treatment Plan Summary: Daily contact with patient to assess and evaluate symptoms and progress in treatment and Medication management  Disposition: Recommend psychiatric Inpatient admission when medically cleared. Supportive therapy provided about ongoing stressors. Discussed crisis plan, support from social network, calling 911, coming to the Emergency Department, and calling Suicide Hotline.  Deloria Lair, NP 07/12/2022 11:13 PM

## 2022-07-12 NOTE — ED Notes (Signed)
VOL/pending psych consult 

## 2022-07-12 NOTE — ED Notes (Signed)
pt recieved snack and drink 

## 2022-07-12 NOTE — ED Provider Notes (Signed)
[facility] Provider Note    Event Date/Time   First MD Initiated Contact with Patient 07/12/22 1805     (approximate)  History   Chief Complaint: Suicidal (Patient brought in by police for suicidal ideations (he is not IVC'd, he called 35 and came in voluntarily); He states that "I just can't get happy and I am really depressed"; He does not have a plan and states that he was aware that he needed help when he began feeling suicidal)  HPI  Danny Cook. is a 62 y.o. male with a past medical history of anxiety, gastric reflux, schizophrenia, HIV, hypertension, presents to the emergency department for depression.  According to the patient he has been experiencing worsening depression over the past few weeks.  Patient states he lives alone and due to the loneliness is why he is feeling depressed.  Patient states he began having some vague suicidal thoughts he knew he needed help so he came to the emergency department voluntarily.  Patient denies any active plan to hurt himself.  Patient denies any alcohol or drugs.  Denies any medical complaints today.  Physical Exam   Triage Vital Signs: ED Triage Vitals  Enc Vitals Group     BP 07/12/22 1740 (!) 151/82     Pulse Rate 07/12/22 1740 86     Resp 07/12/22 1740 15     Temp 07/12/22 1740 (!) 97.4 F (36.3 C)     Temp Source 07/12/22 1740 Oral     SpO2 07/12/22 1740 97 %     Weight 07/12/22 1739 193 lb (87.5 kg)     Height 07/12/22 1739 '5\' 7"'$  (1.702 m)     Head Circumference --      Peak Flow --      Pain Score --      Pain Loc --      Pain Edu? --      Excl. in [place]? --     Most recent vital signs: Vitals:   07/12/22 1740  BP: (!) 151/82  Pulse: 86  Resp: 15  Temp: (!) 97.4 F (36.3 C)  SpO2: 97%    General: Awake, no distress.  CV:  Good peripheral perfusion.  Regular rate and rhythm  Resp:  Normal effort.  Equal breath sounds bilaterally.  Abd:  No distention.  Soft, nontender.  No  rebound or guarding.   ED Results / Procedures / Treatments   MEDICATIONS ORDERED IN ED: Medications - No data to display   IMPRESSION / MDM / [place] / ED COURSE  I reviewed the triage vital signs and the nursing notes.  Patient's presentation is most consistent with acute presentation with potential threat to life or bodily function.  Patient presents emergency department for worsening depression here voluntarily.  Has noted some vague suicidal thoughts but no plan.  States he knew that he needed help so he came voluntarily and is willing to stay voluntarily in the emergency department to speak to psychiatry.  We will check labs and continue to closely monitor.  Will have psychiatry TTS evaluate.  Patient agreeable to plan of care.  Patient is lab work shows a normal chemistry, negative alcohol acetaminophen and salicylate level, normal CBC negative COVID/flu/RSV.  Urine drug screen positive for benzodiazepines only.  Awaiting psychiatry TTS evaluation.  FINAL CLINICAL IMPRESSION(S) / ED DIAGNOSES   Depression  Note:  This document was prepared using Dragon voice recognition software and may include unintentional dictation  errors.   Harvest Dark, MD 07/12/22 2104

## 2022-07-12 NOTE — ED Triage Notes (Signed)
Patient brought in by police for suicidal ideations (he is not IVC'd, he called 93 and came in voluntarily); He states that "I just can't get happy and I am really depressed"; He does not have a plan and states that he was aware that he needed help when he began feeling suicidal

## 2022-07-12 NOTE — ED Notes (Signed)
First nurse note: [place] PD brought pt in voluntarily- officer stated that pt was having SI with a plan to OD on pills

## 2022-07-12 NOTE — ED Notes (Signed)
Assumed care of patient.  Patient oriented to unit regarding rounding and cameras.  Patient verbalized understanding.

## 2022-07-12 NOTE — ED Notes (Signed)
Pt states he has been depressed for a while, does take meds for depression but is not consistent with taking them. Pt admits to living alone and feeling very lonely to the point where he felt suicidal today. Pt endorses wanting to move to assisted living and has the support of brother and sister-in-law.

## 2022-07-12 NOTE — ED Notes (Signed)
Pt moved to [facility] 

## 2022-07-13 ENCOUNTER — Encounter (HOSPITAL_COMMUNITY): Payer: Self-pay | Admitting: Psychiatry

## 2022-07-13 ENCOUNTER — Inpatient Hospital Stay (HOSPITAL_COMMUNITY)
Admission: AD | Admit: 2022-07-13 | Discharge: 2022-07-22 | DRG: 885 | Disposition: A | Discharge status: Home / Self Care | Payer: Medicare Other | Source: Intra-hospital | Attending: Psychiatry | Admitting: Psychiatry

## 2022-07-13 ENCOUNTER — Other Ambulatory Visit: Payer: Self-pay

## 2022-07-13 DIAGNOSIS — Z7984 Long term (current) use of oral hypoglycemic drugs: Secondary | ICD-10-CM

## 2022-07-13 DIAGNOSIS — Z87442 Personal history of urinary calculi: Secondary | ICD-10-CM | POA: Diagnosis not present

## 2022-07-13 DIAGNOSIS — B20 Human immunodeficiency virus [HIV] disease: Secondary | ICD-10-CM | POA: Diagnosis present

## 2022-07-13 DIAGNOSIS — N400 Enlarged prostate without lower urinary tract symptoms: Secondary | ICD-10-CM | POA: Diagnosis present

## 2022-07-13 DIAGNOSIS — E1122 Type 2 diabetes mellitus with diabetic chronic kidney disease: Secondary | ICD-10-CM | POA: Diagnosis present

## 2022-07-13 DIAGNOSIS — E7800 Pure hypercholesterolemia, unspecified: Secondary | ICD-10-CM | POA: Diagnosis present

## 2022-07-13 DIAGNOSIS — Z7982 Long term (current) use of aspirin: Secondary | ICD-10-CM | POA: Diagnosis not present

## 2022-07-13 DIAGNOSIS — I130 Hypertensive heart and chronic kidney disease with heart failure and stage 1 through stage 4 chronic kidney disease, or unspecified chronic kidney disease: Secondary | ICD-10-CM | POA: Diagnosis present

## 2022-07-13 DIAGNOSIS — Z79899 Other long term (current) drug therapy: Secondary | ICD-10-CM

## 2022-07-13 DIAGNOSIS — J45909 Unspecified asthma, uncomplicated: Secondary | ICD-10-CM | POA: Diagnosis present

## 2022-07-13 DIAGNOSIS — K219 Gastro-esophageal reflux disease without esophagitis: Secondary | ICD-10-CM | POA: Diagnosis present

## 2022-07-13 DIAGNOSIS — I209 Angina pectoris, unspecified: Secondary | ICD-10-CM | POA: Diagnosis present

## 2022-07-13 DIAGNOSIS — I5032 Chronic diastolic (congestive) heart failure: Secondary | ICD-10-CM | POA: Diagnosis present

## 2022-07-13 DIAGNOSIS — K5900 Constipation, unspecified: Secondary | ICD-10-CM | POA: Diagnosis present

## 2022-07-13 DIAGNOSIS — F607 Dependent personality disorder: Secondary | ICD-10-CM | POA: Diagnosis present

## 2022-07-13 DIAGNOSIS — Z7951 Long term (current) use of inhaled steroids: Secondary | ICD-10-CM

## 2022-07-13 DIAGNOSIS — I10 Essential (primary) hypertension: Secondary | ICD-10-CM | POA: Diagnosis present

## 2022-07-13 DIAGNOSIS — Z9151 Personal history of suicidal behavior: Secondary | ICD-10-CM

## 2022-07-13 DIAGNOSIS — Z8673 Personal history of transient ischemic attack (TIA), and cerebral infarction without residual deficits: Secondary | ICD-10-CM | POA: Diagnosis not present

## 2022-07-13 DIAGNOSIS — F251 Schizoaffective disorder, depressive type: Secondary | ICD-10-CM | POA: Diagnosis not present

## 2022-07-13 DIAGNOSIS — N189 Chronic kidney disease, unspecified: Secondary | ICD-10-CM | POA: Diagnosis present

## 2022-07-13 DIAGNOSIS — Z21 Asymptomatic human immunodeficiency virus [HIV] infection status: Secondary | ICD-10-CM | POA: Diagnosis present

## 2022-07-13 DIAGNOSIS — Z818 Family history of other mental and behavioral disorders: Secondary | ICD-10-CM

## 2022-07-13 DIAGNOSIS — E119 Type 2 diabetes mellitus without complications: Secondary | ICD-10-CM

## 2022-07-13 DIAGNOSIS — F332 Major depressive disorder, recurrent severe without psychotic features: Secondary | ICD-10-CM | POA: Diagnosis present

## 2022-07-13 DIAGNOSIS — F411 Generalized anxiety disorder: Secondary | ICD-10-CM | POA: Diagnosis present

## 2022-07-13 DIAGNOSIS — R45851 Suicidal ideations: Secondary | ICD-10-CM | POA: Diagnosis present

## 2022-07-13 LAB — CBG MONITORING, ED
Glucose-Capillary: 122 mg/dL — ABNORMAL HIGH (ref 70–99)
Glucose-Capillary: 176 mg/dL — ABNORMAL HIGH (ref 70–99)

## 2022-07-13 MED ORDER — TIMOLOL MALEATE 0.5 % OP SOLN
1.0000 [drp] | Freq: Two times a day (BID) | OPHTHALMIC | Status: DC
  Administered 2022-07-13 – 2022-07-22 (×16): 1 [drp] via OPHTHALMIC
  Filled 2022-07-13 (×2): qty 5

## 2022-07-13 MED ORDER — ALBUTEROL SULFATE HFA 108 (90 BASE) MCG/ACT IN AERS: 2.0000 | INHALATION_SPRAY | Freq: Four times a day (QID) | RESPIRATORY_TRACT | Status: DC | PRN

## 2022-07-13 MED ORDER — DIAZEPAM 5 MG PO TABS
5.0000 mg | ORAL_TABLET | Freq: Three times a day (TID) | ORAL | Status: DC
  Administered 2022-07-13 – 2022-07-14 (×3): 5 mg via ORAL
  Filled 2022-07-13 (×3): qty 1

## 2022-07-13 MED ORDER — SEMAGLUTIDE (1 MG/DOSE) 4 MG/3ML ~~LOC~~ SOPN: 2.0000 mg | PEN_INJECTOR | SUBCUTANEOUS | Status: DC

## 2022-07-13 MED ORDER — HYDROXYZINE HCL 25 MG PO TABS
25.0000 mg | ORAL_TABLET | Freq: Three times a day (TID) | ORAL | Status: DC | PRN
  Administered 2022-07-16 – 2022-07-21 (×6): 25 mg via ORAL
  Filled 2022-07-13 (×6): qty 1

## 2022-07-13 MED ORDER — SUCRALFATE 1 G PO TABS
1.0000 g | ORAL_TABLET | Freq: Three times a day (TID) | ORAL | Status: DC
  Administered 2022-07-13: 1 g via ORAL
  Filled 2022-07-13: qty 1

## 2022-07-13 MED ORDER — BENZTROPINE MESYLATE 1 MG PO TABS: 0.5000 mg | ORAL_TABLET | Freq: Two times a day (BID) | ORAL | Status: DC

## 2022-07-13 MED ORDER — BRIMONIDINE TARTRATE 0.2 % OP SOLN
1.0000 [drp] | Freq: Two times a day (BID) | OPHTHALMIC | Status: DC
  Administered 2022-07-13 – 2022-07-22 (×16): 1 [drp] via OPHTHALMIC
  Filled 2022-07-13 (×2): qty 5

## 2022-07-13 MED ORDER — TAMSULOSIN HCL 0.4 MG PO CAPS: 0.4000 mg | ORAL_CAPSULE | Freq: Every day | ORAL | Status: DC

## 2022-07-13 MED ORDER — BENZTROPINE MESYLATE 1 MG PO TABS
1.5000 mg | ORAL_TABLET | Freq: Every day | ORAL | Status: DC
  Administered 2022-07-13 – 2022-07-14 (×2): 1.5 mg via ORAL
  Filled 2022-07-13 (×5): qty 1

## 2022-07-13 MED ORDER — SENNA 8.6 MG PO TABS: 1.0000 | ORAL_TABLET | Freq: Every day | ORAL | Status: DC

## 2022-07-13 MED ORDER — METFORMIN HCL 500 MG PO TABS: 1000.0000 mg | ORAL_TABLET | Freq: Two times a day (BID) | ORAL | Status: DC

## 2022-07-13 MED ORDER — TAMSULOSIN HCL 0.4 MG PO CAPS
0.4000 mg | ORAL_CAPSULE | Freq: Every day | ORAL | Status: DC
  Administered 2022-07-13 – 2022-07-22 (×10): 0.4 mg via ORAL
  Filled 2022-07-13 (×14): qty 1

## 2022-07-13 MED ORDER — INSULIN DETEMIR 100 UNIT/ML ~~LOC~~ SOLN
30.0000 [IU] | Freq: Every day | SUBCUTANEOUS | Status: DC
  Filled 2022-07-13: qty 0.3

## 2022-07-13 MED ORDER — GLIMEPIRIDE 4 MG PO TABS: 4.0000 mg | ORAL_TABLET | Freq: Every day | ORAL | Status: DC

## 2022-07-13 MED ORDER — GABAPENTIN 300 MG PO CAPS: 600.0000 mg | ORAL_CAPSULE | Freq: Three times a day (TID) | ORAL | Status: DC

## 2022-07-13 MED ORDER — FLUTICASONE PROPIONATE HFA 220 MCG/ACT IN AERO: 2.0000 | INHALATION_SPRAY | Freq: Two times a day (BID) | RESPIRATORY_TRACT | Status: DC

## 2022-07-13 MED ORDER — DONEPEZIL HCL 5 MG PO TABS: 10.0000 mg | ORAL_TABLET | Freq: Every day | ORAL | Status: DC

## 2022-07-13 MED ORDER — HYDROXYZINE HCL 25 MG PO TABS
25.0000 mg | ORAL_TABLET | Freq: Three times a day (TID) | ORAL | Status: DC | PRN
  Administered 2022-07-13: 25 mg via ORAL
  Filled 2022-07-13: qty 1

## 2022-07-13 MED ORDER — MIRTAZAPINE 15 MG PO TABS
45.0000 mg | ORAL_TABLET | Freq: Every day | ORAL | Status: DC
  Administered 2022-07-13: 45 mg via ORAL
  Filled 2022-07-13 (×3): qty 3
  Filled 2022-07-13: qty 1
  Filled 2022-07-13: qty 3

## 2022-07-13 MED ORDER — SUCRALFATE 1 G PO TABS
1.0000 g | ORAL_TABLET | Freq: Four times a day (QID) | ORAL | Status: DC
  Administered 2022-07-13 – 2022-07-14 (×4): 1 g via ORAL
  Filled 2022-07-13 (×13): qty 1

## 2022-07-13 MED ORDER — DOCUSATE SODIUM 100 MG PO CAPS
200.0000 mg | ORAL_CAPSULE | Freq: Two times a day (BID) | ORAL | Status: DC
  Administered 2022-07-13: 200 mg via ORAL
  Filled 2022-07-13: qty 2

## 2022-07-13 MED ORDER — PANTOPRAZOLE SODIUM 40 MG PO TBEC: 40.0000 mg | DELAYED_RELEASE_TABLET | Freq: Two times a day (BID) | ORAL | Status: DC

## 2022-07-13 MED ORDER — LOSARTAN POTASSIUM 50 MG PO TABS
50.0000 mg | ORAL_TABLET | Freq: Every day | ORAL | Status: DC
  Administered 2022-07-13: 50 mg via ORAL
  Filled 2022-07-13: qty 1

## 2022-07-13 MED ORDER — GLYCOPYRROLATE 1 MG PO TABS: 0.5000 mg | ORAL_TABLET | Freq: Two times a day (BID) | ORAL | Status: DC

## 2022-07-13 MED ORDER — GEMFIBROZIL 600 MG PO TABS: 600.0000 mg | ORAL_TABLET | Freq: Two times a day (BID) | ORAL | Status: DC

## 2022-07-13 MED ORDER — PANTOPRAZOLE SODIUM 40 MG PO TBEC
40.0000 mg | DELAYED_RELEASE_TABLET | Freq: Every day | ORAL | Status: DC
  Administered 2022-07-13 – 2022-07-22 (×10): 40 mg via ORAL
  Filled 2022-07-13 (×15): qty 1

## 2022-07-13 MED ORDER — DICYCLOMINE HCL 10 MG PO CAPS
10.0000 mg | ORAL_CAPSULE | Freq: Three times a day (TID) | ORAL | Status: DC
  Administered 2022-07-13 – 2022-07-22 (×36): 10 mg via ORAL
  Filled 2022-07-13 (×58): qty 1

## 2022-07-13 MED ORDER — ACETAMINOPHEN 325 MG PO TABS
650.0000 mg | ORAL_TABLET | Freq: Four times a day (QID) | ORAL | Status: DC | PRN
  Administered 2022-07-13 – 2022-07-21 (×15): 650 mg via ORAL
  Filled 2022-07-13 (×15): qty 2

## 2022-07-13 MED ORDER — MIRTAZAPINE 15 MG PO TABS: 45.0000 mg | ORAL_TABLET | Freq: Every day | ORAL | Status: DC

## 2022-07-13 MED ORDER — ABACAVIR-DOLUTEGRAVIR-LAMIVUD 600-50-300 MG PO TABS: 1.0000 | ORAL_TABLET | Freq: Every day | ORAL | Status: DC

## 2022-07-13 MED ORDER — MEMANTINE HCL 10 MG PO TABS
10.0000 mg | ORAL_TABLET | Freq: Two times a day (BID) | ORAL | Status: DC
  Administered 2022-07-14: 10 mg via ORAL
  Filled 2022-07-13 (×8): qty 1

## 2022-07-13 MED ORDER — MAGNESIUM HYDROXIDE 400 MG/5ML PO SUSP
30.0000 mL | Freq: Every day | ORAL | Status: DC | PRN
  Administered 2022-07-15 – 2022-07-18 (×2): 30 mL via ORAL
  Filled 2022-07-13 (×2): qty 30

## 2022-07-13 MED ORDER — BRIMONIDINE TARTRATE-TIMOLOL 0.2-0.5 % OP SOLN
1.0000 [drp] | Freq: Two times a day (BID) | OPHTHALMIC | Status: DC
  Filled 2022-07-13 (×2): qty 5

## 2022-07-13 MED ORDER — ROSUVASTATIN CALCIUM 20 MG PO TABS: 40.0000 mg | ORAL_TABLET | Freq: Every day | ORAL | Status: DC

## 2022-07-13 MED ORDER — DIVALPROEX SODIUM ER 500 MG PO TB24
500.0000 mg | ORAL_TABLET | Freq: Every day | ORAL | Status: DC
  Administered 2022-07-13: 500 mg via ORAL
  Filled 2022-07-13 (×4): qty 1

## 2022-07-13 MED ORDER — MEMANTINE HCL 10 MG PO TABS: 10.0000 mg | ORAL_TABLET | Freq: Two times a day (BID) | ORAL | Status: DC

## 2022-07-13 MED ORDER — ASPIRIN 325 MG PO TBEC
325.0000 mg | DELAYED_RELEASE_TABLET | Freq: Every day | ORAL | Status: DC
  Administered 2022-07-13 – 2022-07-22 (×10): 325 mg via ORAL
  Filled 2022-07-13 (×15): qty 1

## 2022-07-13 MED ORDER — MECLIZINE HCL 12.5 MG PO TABS: 25.0000 mg | ORAL_TABLET | Freq: Three times a day (TID) | ORAL | Status: DC | PRN

## 2022-07-13 MED ORDER — ZIPRASIDONE HCL 40 MG PO CAPS
40.0000 mg | ORAL_CAPSULE | Freq: Two times a day (BID) | ORAL | Status: DC
  Administered 2022-07-13 – 2022-07-14 (×2): 40 mg via ORAL
  Filled 2022-07-13 (×7): qty 1

## 2022-07-13 MED ORDER — VENLAFAXINE HCL ER 150 MG PO CP24
150.0000 mg | ORAL_CAPSULE | Freq: Every day | ORAL | Status: DC
  Filled 2022-07-13 (×2): qty 1

## 2022-07-13 MED ORDER — SENNA 8.6 MG PO TABS
1.0000 | ORAL_TABLET | Freq: Every day | ORAL | Status: DC
  Administered 2022-07-13 – 2022-07-22 (×9): 8.6 mg via ORAL
  Filled 2022-07-13 (×14): qty 1

## 2022-07-13 MED ORDER — ALUM & MAG HYDROXIDE-SIMETH 200-200-20 MG/5ML PO SUSP
30.0000 mL | ORAL | Status: DC | PRN
  Administered 2022-07-14: 30 mL via ORAL
  Filled 2022-07-13: qty 30

## 2022-07-13 MED ORDER — BENZTROPINE MESYLATE 1 MG PO TABS: 1.5000 mg | ORAL_TABLET | Freq: Every day | ORAL | Status: DC

## 2022-07-13 MED ORDER — ZIPRASIDONE HCL 20 MG PO CAPS: 40.0000 mg | ORAL_CAPSULE | Freq: Two times a day (BID) | ORAL | Status: DC

## 2022-07-13 MED ORDER — DIAZEPAM 5 MG PO TABS: 5.0000 mg | ORAL_TABLET | Freq: Three times a day (TID) | ORAL | Status: DC

## 2022-07-13 MED ORDER — ROSUVASTATIN CALCIUM 20 MG PO TABS
40.0000 mg | ORAL_TABLET | Freq: Every day | ORAL | Status: DC
  Administered 2022-07-14 – 2022-07-22 (×9): 40 mg via ORAL
  Filled 2022-07-13 (×2): qty 2
  Filled 2022-07-13 (×2): qty 1
  Filled 2022-07-13 (×5): qty 2
  Filled 2022-07-13: qty 1
  Filled 2022-07-13 (×4): qty 2
  Filled 2022-07-13: qty 1

## 2022-07-13 MED ORDER — SUCRALFATE 1 G PO TABS: 1.0000 g | ORAL_TABLET | Freq: Three times a day (TID) | ORAL | Status: DC

## 2022-07-13 MED ORDER — VENLAFAXINE HCL ER 75 MG PO CP24: 150.0000 mg | ORAL_CAPSULE | Freq: Every day | ORAL | Status: DC

## 2022-07-13 MED ORDER — DOCUSATE SODIUM 50 MG PO CAPS
250.0000 mg | ORAL_CAPSULE | Freq: Two times a day (BID) | ORAL | Status: DC
  Administered 2022-07-13: 250 mg via ORAL
  Filled 2022-07-13: qty 1
  Filled 2022-07-13: qty 2
  Filled 2022-07-13: qty 1

## 2022-07-13 NOTE — ED Notes (Signed)
Attempted to call in report to [phone]. No answer, will attempt again later

## 2022-07-13 NOTE — Progress Notes (Signed)
Adult Psychoeducational Group Note  Date:  07/13/2022 Time:  8:37 PM  Group Topic/Focus:  Wrap-Up Group:   The focus of this group is to help patients review their daily goal of treatment and discuss progress on daily workbooks.  Participation Level:  Active  Participation Quality:  Appropriate  Affect:  Flat  Cognitive:  Appropriate  Insight: Appropriate  Engagement in Group:  Engaged  Modes of Intervention:  Discussion  Additional Comments:   Today was pts first day on the unit. Pt states he is proud I=of himself for seeking treatment but is worried about his SI post D/C but states he has none at this time. Pt was advised to formalize a plan with his care team tomorrow. Pt endorses anxiety and depression at this time.   Gerhard Perches 07/13/2022, 8:37 PM

## 2022-07-13 NOTE — ED Notes (Signed)
vol/consult done/recommends psychiatric admission when medically cleared.[name]

## 2022-07-13 NOTE — ED Notes (Signed)
EMTALA reviewed by this Charge Rn and all required documentation is up to date. Pt is ready for transport.

## 2022-07-13 NOTE — ED Notes (Signed)
Pt denies HI/AVH on asssessment. He does endorse SI with plan to overdose on pills. Safety on unit maintained. Pt recommended for inpatient admission at this time.

## 2022-07-13 NOTE — ED Notes (Signed)
Report given to [place], Therapist, sports. Awaiting transportation

## 2022-07-13 NOTE — Progress Notes (Signed)
Patient arrived on the unit to 503-1. Admission note to follow.

## 2022-07-13 NOTE — Progress Notes (Signed)
Patient admitted to 503-1 for suicidal ideation and depression. Patient is alert and oriented x4. He is dressed in scrubs on admission and no skin issues are noted. Patient compliant with all admission paperwork and procedures. Denies HI/AVH- endorses passive SI with no plan and contracts for safety. Patient states "I need help with my depression and my housing. I don't want to live alone anymore." Pt reports his suicidal thoughts always increase when he is living on his own. Patient rates pain in left shoulder 8-9/10. Left shoulder was x-rayed at [facility] but showed no acute findings. Patient reports he has a past medical history of hypertension, diabetes, anxiety, and he is HIV positive. Patient is pleasant during interaction and maintains fair eye contact. No evidence of him responding to any internal stimuli. Oriented pt to unit and his room. Q71mn checks remain in place. Patient is safe on unit.

## 2022-07-13 NOTE — Tx Team (Signed)
Initial Treatment Plan 07/13/2022 3:15 PM [name]. HWK:[id number]    PATIENT STRESSORS: Financial difficulties   Other: does not like living alone     PATIENT STRENGTHS: Ability for insight  Communication skills  Motivation for treatment/growth    PATIENT IDENTIFIED PROBLEMS: Patient reports he does not like living alone and everytime he lives alone, he becomes suicidal.       Two areas he wants to work on while he is here is "housing and my depression."               DISCHARGE CRITERIA:  Ability to meet basic life and health needs Adequate post-discharge living arrangements Improved stabilization in mood, thinking, and/or behavior Verbal commitment to aftercare and medication compliance  PRELIMINARY DISCHARGE PLAN: Outpatient therapy  PATIENT/FAMILY INVOLVEMENT: This treatment plan has been presented to and reviewed with the patient, Nolon Yellin.  The patient and family have been given the opportunity to ask questions and make suggestions.  Fritz Pickerel, RN 07/13/2022, 3:15 PM

## 2022-07-13 NOTE — BH Assessment (Signed)
Comprehensive Clinical Assessment (CCA) Note  07/13/2022 Danny Cook [id number]  Chief Complaint:  Chief Complaint  Patient presents with   Suicidal    Patient brought in by police for suicidal ideations (he is not IVC'd, he called 47 and came in voluntarily); He states that "I just can't get happy and I am really depressed"; He does not have a plan and states that he was aware that he needed help when he began feeling suicidal   Visit Diagnosis: Major Depression   Danny Cook is a 62 year old male who presents to the ER due to having thoughts and plans of ending his life. He states he thinking about overdosing on his medications. He had a prior attempt approximately ten years ago. He currently lives alone and states it is hard for him and causing him stress. Patient further reports, he has become forgetful and sometimes he doesn't remember taking his medications. Patient denies HI and AV/H. He also denies involvement with the legal system and no use of mind-altering substances.  CCA Screening, Triage and Referral (STR)  Patient Reported Information How did you hear about [place]? Self  What Is the Reason for Your Visit/Call Today? Patient having thoughts of ending his life.  How Long Has This Been Causing You Problems? 1 wk - 1 month  What Do You Feel Would Help You the Most Today? Treatment for Depression or other mood problem   Have You Recently Had Any Thoughts About Hurting Yourself? Yes  Are You Planning to Commit Suicide/Harm Yourself At This time? Yes   Flowsheet Row ED from 07/12/2022 in [place] ED from 07/10/2022 in [place] ED from 05/18/2022 in [place] CATEGORY Moderate Risk No Risk No Risk       Have you Recently Had Thoughts About [place]? No  Are You Planning to Harm Someone at This Time?  No  Explanation: No data recorded  Have You Used Any Alcohol or Drugs in the Past 24 Hours? No  What Did You Use and How Much? No data recorded  Do You Currently Have a Therapist/Psychiatrist? No  Name of Therapist/Psychiatrist:    Have You Been Recently Discharged From Any Office Practice or Programs? No  Explanation of Discharge From Practice/Program: No data recorded    CCA Screening Triage Referral Assessment Type of Contact: Face-to-Face  Telemedicine Service Delivery:   Is this Initial or Reassessment?   Date Telepsych consult ordered in [facility]:    Time Telepsych consult ordered in [facility]:    Location of Assessment: [facility] ED  Provider Location: [facility] ED   Collateral Involvement: Brother- [place]   Does Patient Have a [place]? No  Legal Guardian Contact Information: No data recorded Copy of Legal Guardianship Form: No data recorded Legal Guardian Notified of Arrival: No data recorded Legal Guardian Notified of Pending Discharge: No data recorded If Minor and Not Living with Parent(s), Who has Custody? n/a  Is CPS involved or ever been involved? Never  Is APS involved or ever been involved? Never   Patient Determined To Be At Risk for Harm To Self or Others Based on Review of Patient Reported Information or Presenting Complaint? No data recorded Method: No data recorded Availability of Means: No data recorded Intent: No data recorded Notification Required: No data recorded Additional Information for Danger to Others Potential: No data recorded Additional Comments for Danger  to Others Potential: No data recorded Are There Guns or Other Weapons in [place]? No  Types of Guns/Weapons: No data recorded Are These Weapons Safely Secured?                            No  Who Could Verify You Are Able To Have These Secured: No data recorded Do You Have any Outstanding Charges, Pending Court Dates, Parole/Probation? No data  recorded Contacted To Inform of Risk of Harm To Self or Others: No data recorded   Does Patient Present under Involuntary Commitment? No    [place] of Residence:    Patient Currently Receiving the Following Services: Not Receiving Services   Determination of Need: Emergent (2 hours)   Options For Referral: Inpatient Hospitalization; ED Visit     CCA Biopsychosocial Patient Reported Schizophrenia/Schizoaffective Diagnosis in Past: No   Strengths: Some insight, pleasant, stable housing.   Mental Health Symptoms Depression:   Hopelessness   Duration of Depressive symptoms:  Duration of Depressive Symptoms: Less than two weeks   Mania:   N/A   Anxiety:    N/A   Psychosis:   None   Duration of Psychotic symptoms:    Trauma:   N/A   Obsessions:   N/A   Compulsions:   N/A   Inattention:   N/A   Hyperactivity/Impulsivity:   N/A   Oppositional/Defiant Behaviors:   N/A   Emotional Irregularity:   N/A   Other Mood/Personality Symptoms:  No data recorded   Mental Status Exam Appearance and self-care  Stature:   Average   Weight:   Average weight   Clothing:   Neat/clean; Age-appropriate   Grooming:   Normal   Cosmetic use:   None   Posture/gait:   Normal   Motor activity:   Slowed   Sensorium  Attention:   Normal   Concentration:   Normal   Orientation:   X5   Recall/memory:   Normal   Affect and Mood  Affect:   Appropriate; Depressed; Flat   Mood:   Anxious; Depressed   Relating  Eye contact:   Normal   Facial expression:   Depressed; Sad   Attitude toward examiner:   Cooperative   Thought and Language  Speech flow:  Clear and Coherent   Thought content:   Appropriate to Mood and Circumstances   Preoccupation:   None   Hallucinations:   None   Organization:   Nurse, mental health of Knowledge:   Fair   Intelligence:   Average   Abstraction:   Functional    Judgement:   Fair   Art therapist:   Adequate   Insight:   Poor   Decision Making:   Normal   Social Functioning  Social Maturity:   Responsible   Social Judgement:   Normal   Stress  Stressors:   Grief/losses; Relationship; Transitions   Coping Ability:   Deficient supports; Overwhelmed   Skill Deficits:   None   Supports:   Friends/Service system     Religion: Religion/Spirituality Are You A Religious Person?: No  Leisure/Recreation: Leisure / Recreation Do You Have Hobbies?: No  Exercise/Diet: Exercise/Diet Do You Exercise?: No Have You Gained or Lost A Significant Amount of Weight in the Past Six Months?: No Do You Follow a Special Diet?: No Do You Have Any Trouble Sleeping?: Yes   CCA Employment/Education Employment/Work Situation: Employment / Work Nurse, 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 Patient's Job has Been Impacted by Current Illness: No  Education: Education Is Patient Currently Attending School?: No Did You Attend College?: No Did You Have An Individualized Education Program (IIEP): No Did You Have Any Difficulty At School?: No Patient's Education Has Been Impacted by Current Illness: No   CCA Family/Childhood History Family and Relationship History: Family history Marital status: Single Does patient have children?: No  Childhood History:  Childhood History By whom was/is the patient raised?: Both parents Did patient suffer any verbal/emotional/physical/sexual abuse as a child?: No Did patient suffer from severe childhood neglect?: No Has patient ever been sexually abused/assaulted/raped as an adolescent or adult?: No Was the patient ever a victim of a crime or a disaster?: No Witnessed domestic violence?: No Has patient been affected by domestic violence as an adult?: No       CCA Substance Use Alcohol/Drug Use: Alcohol / Drug Use Pain Medications: See MAR Prescriptions: See MAR Over the Counter: See  MAR History of alcohol / drug use?: No history of alcohol / drug abuse Longest period of sobriety (when/how long): n/a     ASAM's:  Six Dimensions of Multidimensional Assessment  Dimension 1:  Acute Intoxication and/or Withdrawal Potential:      Dimension 2:  Biomedical Conditions and Complications:      Dimension 3:  Emotional, Behavioral, or Cognitive Conditions and Complications:     Dimension 4:  Readiness to Change:     Dimension 5:  Relapse, Continued use, or Continued Problem Potential:     Dimension 6:  Recovery/Living Environment:     ASAM Severity Score:    ASAM Recommended Level of Treatment:     Substance use Disorder (SUD)    Recommendations for Services/Supports/Treatments:    Discharge Disposition:    DSM5 Diagnoses: Patient Active Problem List   Diagnosis Date Noted   Severe episode of recurrent major depressive disorder, without psychotic features ([place]) 07/12/2022   Generalized anxiety disorder 07/12/2022   Severe recurrent major depression with psychotic features ([place]) 04/15/2022   HIV positive ([place]) 11/04/2017   Chronic diastolic heart failure ([place]) 07/29/2017   HTN (hypertension) 07/29/2017   Diabetes ([place]) 07/29/2017     Referrals to Alternative Service(s): Referred to Alternative Service(s):   Place:   Date:   Time:    Referred to Alternative Service(s):   Place:   Date:   Time:    Referred to Alternative Service(s):   Place:   Date:   Time:    Referred to Alternative Service(s):   Place:   Date:   Time:     Gunnar Fusi MS, LCAS, [facility], [facility] Therapeutic Triage Specialist 07/13/2022 9:34 AM

## 2022-07-13 NOTE — Consult Note (Signed)
[facility] Consult   Reason for Consult:  Psychiatric evaluation Referring Physician:  Dr. Kerman Passey Patient Identification: Danny Cook. MRN:  [id number] Principal Diagnosis: Major depressive disorder, recurrent, severe without psychosis Diagnosis:  Active Problems:   Severe recurrent major depression with psychotic features (HCC)   Severe episode of recurrent major depressive disorder, without psychotic features ([place])   Generalized anxiety disorder   Total Time spent with patient: 45 minutes  Subjective:   "I'm depressed and have suicidal thoughts."  62 yo male with high depression and many suicide risk factors including living alone, older white male.  Continues to be suicidal, agreeable to inpatient psychiatry for stabilization.    HPI:  Danny Cook., 62 y.o., male patient seen by this provider; chart reviewed and consulted with Dr. Kerman Passey on 07/13/22.  On evaluation [name]. reports that he has been feeling very depressed and has been thinking about taking his life.  Today he states he was about to commit suicide but then decided to come to ER instead.  He says he lives alone and his depression and anxiety prohibits him from feeling safe.  He says he is prescribed effexor and seroquel, but admits that he forgets to take his medication often.  He a prior history of SA where he attempted to overdose on "pills".    The patient is at acute elevated risk of suicide and further worsening of psychiatric condition. Risk factors for suicide for this patient include:  current treatment non-compliance, current diagnosis of depression, hopelessness, past suicide attempt, suicide plan with accessible method,  and chronic poor judgment.  Protective factors for this patient are limited to: lack of active SI/HI and motivation for treatment.   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[place])    Myocardial infarction ([place])    Paranoid disorder ([place])    Pneumonia    Schizophrenia, paranoid ([place])    Sleep apnea    Stroke ([place])    mini TIA   TIA (transient ischemic attack)     Past Surgical History:  Procedure Laterality Date   CARDIAC CATHETERIZATION     CATARACT EXTRACTION W/PHACO Right 04/25/2021   Procedure: CATARACT EXTRACTION PHACO AND INTRAOCULAR LENS PLACEMENT ([place]) RIGHT DIABETIC;  Surgeon: Birder Robson, MD;  Location: [facility] ORS;  Service: Ophthalmology;  Laterality: Right;  3.32 0:23.0   CATARACT EXTRACTION W/PHACO Left 09/19/2021   Procedure: CATARACT EXTRACTION PHACO AND INTRAOCULAR LENS PLACEMENT ([place]) LEFT DIABETIC;  Surgeon: Birder Robson, MD;  Location: [facility] ORS;  Service: Ophthalmology;  Laterality: Left;  6.73 0:49.1   COLONOSCOPY WITH PROPOFOL N/A 09/03/2015   Procedure: COLONOSCOPY WITH PROPOFOL;  Surgeon: Lollie Sails, MD;  Location: [facility] ENDOSCOPY;  Service: Endoscopy;  Laterality: N/A;   COLONOSCOPY WITH PROPOFOL N/A 09/17/2020   Procedure: COLONOSCOPY WITH PROPOFOL;  Surgeon: Lesly Rubenstein, MD;  Location: [facility] ENDOSCOPY;  Service: Endoscopy;  Laterality: N/A;  PREFERS 9AM OR LATER DUE TO TRANSPORTATION   DG TEETH FULL     ESOPHAGOGASTRODUODENOSCOPY (EGD) WITH PROPOFOL N/A 07/01/2016   Dr. [name], [facility]. abnormal esophageal motility, suspicious for presbyesophagus, bile gastritis s/p biopsy, non-bleeding  erosive gastropathy s/p biopsy, normal duodenum, path with negative H.pylori and +chronic active gastritis   ESOPHAGOGASTRODUODENOSCOPY (EGD) WITH PROPOFOL N/A 09/17/2020   Procedure: ESOPHAGOGASTRODUODENOSCOPY (EGD) WITH PROPOFOL;  Surgeon: Lesly Rubenstein, MD;  Location: [facility] ENDOSCOPY;  Service: Endoscopy;  Laterality: N/A;   EXTRACORPOREAL SHOCK WAVE LITHOTRIPSY Right 11/01/2020   Procedure: EXTRACORPOREAL SHOCK WAVE LITHOTRIPSY (ESWL);  Surgeon: Abbie Sons, MD;  Location: [facility] ORS;  Service: Urology;  Laterality: Right;   LUMBAR LAMINECTOMY/DECOMPRESSION MICRODISCECTOMY N/A 07/15/2021   Procedure: L3-5 DECOMPRESSION;  Surgeon: Meade Maw, MD;  Location: [facility] ORS;  Service: Neurosurgery;  Laterality: N/A;   Family History:  Family History  Problem Relation Age of Onset   Heart attack Mother    Diabetes Mellitus II Mother    Mental illness Mother    CAD Mother    Heart attack Father    CAD Father    Hypertension Father    Family Psychiatric  History:  Social History:  Social History   Substance and Sexual Activity  Alcohol Use No     Social History   Substance and Sexual Activity  Drug Use No   Comment: denies street drugs    Social History   Socioeconomic History   Marital status: Single    Spouse name: Not on file   Number of children: Not on file   Years of education: 11   Highest education level: GED or equivalent  Occupational History   Not on file  Tobacco Use   Smoking status: Never   Smokeless tobacco: Never  Vaping Use   Vaping Use: Never used  Substance and Sexual Activity   Alcohol use: No   Drug use: No    Comment: denies street drugs   Sexual activity: Never  Other Topics Concern   Not on file  Social History Narrative   Not on file   Social Determinants of Health   Financial Resource Strain: Low Risk  (08/12/2017)   Overall Financial Resource Strain (CARDIA)    Difficulty of Paying Living Expenses: Not hard at all  Food  Insecurity: Food Insecurity Present (04/15/2022)   Hunger Vital Sign    Worried About [place] in the Last Year: Sometimes true    Ran Out of Food in the Last Year: Sometimes true  Transportation Needs: Unmet Transportation Needs (04/15/2022)   PRAPARE - Hydrologist (Medical): Yes    Lack of Transportation (Non-Medical): Yes  Physical Activity: Inactive (08/12/2017)   Exercise Vital Sign    Days of Exercise per Week: 0 days    Minutes of Exercise per Session: 0 min  Stress: Stress Concern Present (08/12/2017)   Arab    Feeling of Stress : To some extent  Social Connections: Moderately Isolated (08/12/2017)   Social Connection and Isolation Panel [NHANES]    Frequency of Communication with Friends and Family: More than three times a week    Frequency of Social Gatherings with Friends and Family: Three times a week    Attends Religious Services: Never    Active Member of Clubs or Organizations: No  Attends Archivist Meetings: Never    Marital Status: Never married   Additional Social History:    Allergies:   Allergies  Allergen Reactions   Trazodone And Nefazodone     Affects other meds that they dont work   Viread [Tenofovir Disoproxil]     Damages kidneys   Etodolac Rash    Labs:  Results for orders placed or performed during the hospital encounter of 07/12/22 (from the past 48 hour(s))  Comprehensive metabolic panel     Status: Abnormal   Collection Time: 07/12/22  5:51 PM  Result Value Ref Range   Sodium 140 135 - 145 mmol/L   Potassium 4.0 3.5 - 5.1 mmol/L   Chloride 106 98 - 111 mmol/L   CO2 23 22 - 32 mmol/L   Glucose, Bld 160 (H) 70 - 99 mg/dL    Comment: Glucose reference range applies only to samples taken after fasting for at least 8 hours.   BUN 23 8 - 23 mg/dL   Creatinine, Ser 1.41 (H) 0.61 - 1.24 mg/dL   Calcium 9.6 8.9 - 10.3 mg/dL    Total Protein 7.8 6.5 - 8.1 g/dL   Albumin 4.0 3.5 - 5.0 g/dL   AST 22 15 - 41 U/L   ALT 15 0 - 44 U/L   Alkaline Phosphatase 92 38 - 126 U/L   Total Bilirubin 0.5 0.3 - 1.2 mg/dL   GFR, Estimated 56 (L) >60 mL/min    Comment: (NOTE) Calculated using the CKD-EPI Creatinine Equation (2021)    Anion gap 11 5 - 15    Comment: Performed at [facility], [address] [place], [place] [address]  Ethanol     Status: None   Collection Time: 07/12/22  5:51 PM  Result Value Ref Range   Alcohol, Ethyl (B) <10 <10 mg/dL    Comment: (NOTE) Lowest detectable limit for serum alcohol is 10 mg/dL.  For medical purposes only. Performed at [facility], [address] [place], [place] [address]   Salicylate level     Status: Abnormal   Collection Time: 07/12/22  5:51 PM  Result Value Ref Range   Salicylate Lvl <8.6 (L) 7.0 - 30.0 mg/dL    Comment: Performed at [facility], [address] [place], [place] [address]  Acetaminophen level     Status: Abnormal   Collection Time: 07/12/22  5:51 PM  Result Value Ref Range   Acetaminophen (Tylenol), Serum <10 (L) 10 - 30 ug/mL    Comment: (NOTE) Therapeutic concentrations vary significantly. A range of 10-30 ug/mL  may be an effective concentration for many patients. However, some  are best treated at concentrations outside of this range. Acetaminophen concentrations >150 ug/mL at 4 hours after ingestion  and >50 ug/mL at 12 hours after ingestion are often associated with  toxic reactions.  Performed at [facility], [address] [place], [place] [address]   cbc     Status: Abnormal   Collection Time: 07/12/22  5:51 PM  Result Value Ref Range   WBC 6.6 4.0 - 10.5 K/uL   RBC 4.20 (L) 4.22 - 5.81 MIL/uL   Hemoglobin 13.0 13.0 - 17.0 g/dL   HCT 38.3 (L) 39.0 - 52.0 %   MCV 91.2 80.0 - 100.0 fL   MCH 31.0 26.0 - 34.0 pg   MCHC 33.9 30.0 - 36.0 g/dL   RDW 13.1 11.5 - 15.5 %   Platelets 213 150  - 400 K/uL  nRBC 0.0 0.0 - 0.2 %    Comment: Performed at [facility], [address] [place], [place] [address]  Urine Drug Screen, Qualitative     Status: Abnormal   Collection Time: 07/12/22  5:51 PM  Result Value Ref Range   Tricyclic, Ur Screen NONE DETECTED NONE DETECTED   Amphetamines, Ur Screen NONE DETECTED NONE DETECTED   MDMA (Ecstasy)Ur Screen NONE DETECTED NONE DETECTED   Cocaine Metabolite,Ur [place] NONE DETECTED NONE DETECTED   Opiate, Ur Screen NONE DETECTED NONE DETECTED   Phencyclidine (PCP) Ur S NONE DETECTED NONE DETECTED   Cannabinoid 50 Ng, Ur [place] NONE DETECTED NONE DETECTED   Barbiturates, Ur Screen NONE DETECTED NONE DETECTED   Benzodiazepine, Ur Scrn POSITIVE (A) NONE DETECTED   Methadone Scn, Ur NONE DETECTED NONE DETECTED    Comment: (NOTE) Tricyclics + metabolites, urine    Cutoff 1000 ng/mL Amphetamines + metabolites, urine  Cutoff 1000 ng/mL MDMA (Ecstasy), urine              Cutoff 500 ng/mL Cocaine Metabolite, urine          Cutoff 300 ng/mL Opiate + metabolites, urine        Cutoff 300 ng/mL Phencyclidine (PCP), urine         Cutoff 25 ng/mL Cannabinoid, urine                 Cutoff 50 ng/mL Barbiturates + metabolites, urine  Cutoff 200 ng/mL Benzodiazepine, urine              Cutoff 200 ng/mL Methadone, urine                   Cutoff 300 ng/mL  The urine drug screen provides only a preliminary, unconfirmed analytical test result and should not be used for non-medical purposes. Clinical consideration and professional judgment should be applied to any positive drug screen result due to possible interfering substances. A more specific alternate chemical method must be used in order to obtain a confirmed analytical result. Gas chromatography / mass spectrometry (GC/MS) is the preferred confirm atory method. Performed at [facility], [address] [place], [place] [address]   Resp panel by RT-PCR (RSV, Flu A&B, Covid) Urine,  Clean Catch     Status: None   Collection Time: 07/12/22  5:51 PM   Specimen: Urine, Clean Catch; Nasal Swab  Result Value Ref Range   SARS Coronavirus 2 by RT PCR NEGATIVE NEGATIVE    Comment: (NOTE) SARS-CoV-2 target nucleic acids are NOT DETECTED.  The SARS-CoV-2 RNA is generally detectable in upper respiratory specimens during the acute phase of infection. The lowest concentration of SARS-CoV-2 viral copies this assay can detect is 138 copies/mL. A negative result does not preclude SARS-Cov-2 infection and should not be used as the sole basis for treatment or other patient management decisions. A negative result may occur with  improper specimen collection/handling, submission of specimen other than nasopharyngeal swab, presence of viral mutation(s) within the areas targeted by this assay, and inadequate number of viral copies(<138 copies/mL). A negative result must be combined with clinical observations, patient history, and epidemiological information. The expected result is Negative.  Fact Sheet for Patients:  EntrepreneurPulse.com.au  Fact Sheet for Healthcare Providers:  IncredibleEmployment.be  This test is no t yet approved or cleared by the [place] FDA and  has been authorized for detection and/or diagnosis of SARS-CoV-2 by FDA under an Emergency Use Authorization (EUA). This EUA will remain  in effect (meaning this test can be used) for the duration of the COVID-19 declaration under Section 564(b)(1) of the Act, 21 U.S.C.section 360bbb-3(b)(1), unless the authorization is terminated  or revoked sooner.       Influenza A by PCR NEGATIVE NEGATIVE   Influenza B by PCR NEGATIVE NEGATIVE    Comment: (NOTE) The Xpert Xpress SARS-CoV-2/FLU/RSV plus assay is intended as an aid in the diagnosis of influenza from Nasopharyngeal swab specimens and should not be used as a sole basis for treatment. Nasal washings and aspirates are  unacceptable for Xpert Xpress SARS-CoV-2/FLU/RSV testing.  Fact Sheet for Patients: EntrepreneurPulse.com.au  Fact Sheet for Healthcare Providers: IncredibleEmployment.be  This test is not yet approved or cleared by the [place] FDA and has been authorized for detection and/or diagnosis of SARS-CoV-2 by FDA under an Emergency Use Authorization (EUA). This EUA will remain in effect (meaning this test can be used) for the duration of the COVID-19 declaration under Section 564(b)(1) of the Act, 21 U.S.C. section 360bbb-3(b)(1), unless the authorization is terminated or revoked.     Resp Syncytial Virus by PCR NEGATIVE NEGATIVE    Comment: (NOTE) Fact Sheet for Patients: EntrepreneurPulse.com.au  Fact Sheet for Healthcare Providers: IncredibleEmployment.be  This test is not yet approved or cleared by the [place] FDA and has been authorized for detection and/or diagnosis of SARS-CoV-2 by FDA under an Emergency Use Authorization (EUA). This EUA will remain in effect (meaning this test can be used) for the duration of the COVID-19 declaration under Section 564(b)(1) of the Act, 21 U.S.C. section 360bbb-3(b)(1), unless the authorization is terminated or revoked.  Performed at [facility], [address] [place], [place] [address]   CBG monitoring, ED     Status: Abnormal   Collection Time: 07/12/22  5:57 PM  Result Value Ref Range   Glucose-Capillary 156 (H) 70 - 99 mg/dL    Comment: Glucose reference range applies only to samples taken after fasting for at least 8 hours.  CBG monitoring, ED     Status: Abnormal   Collection Time: 07/13/22  8:40 AM  Result Value Ref Range   Glucose-Capillary 122 (H) 70 - 99 mg/dL    Comment: Glucose reference range applies only to samples taken after fasting for at least 8 hours.    No current facility-administered medications for this encounter.    Current Outpatient Medications  Medication Sig Dispense Refill   abacavir-dolutegravir-lamiVUDine (TRIUMEQ) 600-50-300 MG tablet Take 1 tablet by mouth at bedtime. 30 tablet 6   aspirin EC 325 MG tablet Take 325 mg by mouth daily.     benztropine (COGENTIN) 0.5 MG tablet Take 1.5 mg by mouth daily. Pt unsure of dose     brimonidine-timolol (COMBIGAN) 0.2-0.5 % ophthalmic solution Place 1 drop into both eyes every 12 (twelve) hours.     diazepam (VALIUM) 5 MG tablet Take 1 tablet (5 mg total) by mouth 3 (three) times daily. 30 tablet 0   dicyclomine (BENTYL) 10 MG capsule Take 10 mg by mouth 4 (four) times daily -  before meals and at bedtime.     divalproex (DEPAKOTE ER) 500 MG 24 hr tablet Take 500 mg by mouth at bedtime.     docusate sodium (COLACE) 250 MG capsule Take 250 mg by mouth 2 (two) times daily.     donepezil (ARICEPT) 10 MG tablet Take 5 mg by mouth daily.     FANAPT 12 MG TABS Take 12 mg by mouth 2 (two) times  daily.     Fluocinolone Acetonide 0.01 % OIL Place 2-4 drops in ear(s) every other day. At bedtime for dry skin     fluticasone (FLOVENT HFA) 220 MCG/ACT inhaler Inhale 2 puffs into the lungs 2 (two) times daily. Rinse out mouth afterwards 1 Inhaler 12   gabapentin (NEURONTIN) 400 MG capsule Take 2 capsules (800 mg total) by mouth 3 (three) times daily. 180 capsule 2   gemfibrozil (LOPID) 600 MG tablet Take 600 mg by mouth 2 (two) times daily before a meal.     glimepiride (AMARYL) 4 MG tablet Take 4 mg by mouth daily with breakfast.     glycopyrrolate (ROBINUL) 2 MG tablet Take 0.5 mg by mouth 2 (two) times daily.     Insulin Degludec (TRESIBA FLEXTOUCH) 200 UNIT/ML SOPN Inject 10 Units into the skin daily. (Patient taking differently: Inject 34 Units into the skin daily.)     losartan (COZAAR) 50 MG tablet Take 50 mg by mouth daily.     memantine (NAMENDA) 10 MG tablet Take 10 mg by mouth 2 (two) times daily.     metFORMIN (GLUCOPHAGE) 1000 MG tablet Take 1,000 mg by  mouth 2 (two) times daily with a meal.     mirtazapine (REMERON) 45 MG tablet Take 1 tablet (45 mg total) by mouth at bedtime. 90 tablet 2   Omega-3 Fatty Acids (FISH OIL) 1000 MG CAPS Take 1,000 mg by mouth daily.     omeprazole (PRILOSEC) 40 MG capsule Take 40 mg by mouth 2 (two) times daily.     OZEMPIC, 1 MG/DOSE, 4 MG/3ML SOPN Inject 2 mg into the skin every Monday.     rosuvastatin (CRESTOR) 40 MG tablet Take 40 mg by mouth daily.      senna (SENOKOT) 8.6 MG TABS tablet Take 1 tablet by mouth daily.     sucralfate (CARAFATE) 1 g tablet Take 1 g by mouth 4 (four) times daily.     tamsulosin (FLOMAX) 0.4 MG CAPS capsule Take 0.4 mg by mouth daily.     venlafaxine XR (EFFEXOR-XR) 150 MG 24 hr capsule Take 1 capsule (150 mg total) by mouth daily with breakfast. 30 capsule 2   ziprasidone (GEODON) 40 MG capsule Take 1 capsule (40 mg total) by mouth 2 (two) times daily with a meal. 60 capsule 2   albuterol (PROVENTIL HFA;VENTOLIN HFA) 108 (90 Base) MCG/ACT inhaler Inhale 2 puffs into the lungs every 6 (six) hours as needed for wheezing or shortness of breath. 1 Inhaler 2   fluconazole (DIFLUCAN) 150 MG tablet Take 1 tablet (150 mg total) by mouth daily. (Patient not taking: Reported on 07/12/2022) 4 tablet 0   hydrOXYzine (ATARAX) 25 MG tablet Take 1 tablet (25 mg total) by mouth 3 (three) times daily as needed for anxiety. (Patient not taking: Reported on 06/10/2022) 30 tablet 0   meclizine (ANTIVERT) 25 MG tablet Take 1 tablet (25 mg total) by mouth 3 (three) times daily as needed for dizziness. 30 tablet 0   methocarbamol (ROBAXIN) 500 MG tablet Take 1 tablet (500 mg total) by mouth 4 (four) times daily. (Patient not taking: Reported on 06/10/2022) 120 tablet 0   mometasone (ELOCON) 0.1 % ointment Apply 1 application topically See admin instructions. Apply 4 drops to ear canal at bedtime every other night for itching and dry skin. (Patient not taking: Reported on 06/10/2022)     nitroGLYCERIN  (NITROSTAT) 0.4 MG SL tablet Place 0.4 mg under the tongue every 5 (five) minutes  as needed for chest pain. (Patient not taking: Reported on 07/12/2022)      Musculoskeletal: Strength & Muscle Tone: within normal limits Gait & Station: normal Patient leans: N/A  Psychiatric Specialty Exam: Physical Exam Vitals and nursing note reviewed.  HENT:     Head: Normocephalic and atraumatic.     Nose: Nose normal.     Mouth/Throat:     Mouth: Mucous membranes are dry.  Pulmonary:     Effort: Pulmonary effort is normal.  Musculoskeletal:        General: Normal range of motion.     Cervical back: Normal range of motion and neck supple.  Skin:    General: Skin is dry.  Neurological:     General: No focal deficit present.     Mental Status: He is alert and oriented to person, place, and time.  Psychiatric:        Attention and Perception: Attention and perception normal.        Mood and Affect: Mood is anxious and depressed. Affect is flat and tearful.        Speech: Speech normal.        Behavior: Behavior is withdrawn. Behavior is cooperative.        Cognition and Memory: Cognition and memory normal.        Judgment: Judgment is impulsive and inappropriate.     Review of Systems  Psychiatric/Behavioral:  Positive for depression and suicidal ideas. Negative for hallucinations, memory loss and substance abuse. The patient is nervous/anxious and has insomnia.   All other systems reviewed and are negative.   Blood pressure 129/75, pulse 72, temperature 97.8 F (36.6 C), temperature source Oral, resp. rate 20, height '5\' 7"'$  (1.702 m), weight 87.5 kg, SpO2 99 %.Body mass index is 30.23 kg/m.  General Appearance: Disheveled  Eye Contact:  Fair  Speech:  Normal Rate  Volume:  Normal  Mood:  Anxious and Depressed  Affect:  Congruent  Thought Process:  Coherent and Descriptions of Associations: Intact  Orientation:  Full (Time, Place, and Person)  Thought Content:  Rumination  Suicidal  Thoughts:  Yes.  with intent/plan  Homicidal Thoughts:  No  Memory:  Immediate;   Fair Recent;   Fair Remote;   Fair  Judgement:  Fair  Insight:  Fair  Psychomotor Activity:  Decreased  Concentration:  Concentration: Fair and Attention Span: Fair  Recall:  [employer] of Knowledge:  Fair  Language:  Good  Akathisia:  No  Handed:  Right  AIMS (if indicated):     Assets:  Housing Leisure Time Resilience  ADL's:  Intact  Cognition:  WNL  Sleep:         Physical Exam: Physical Exam Vitals and nursing note reviewed.  HENT:     Head: Normocephalic and atraumatic.     Nose: Nose normal.     Mouth/Throat:     Mouth: Mucous membranes are dry.  Pulmonary:     Effort: Pulmonary effort is normal.  Musculoskeletal:        General: Normal range of motion.     Cervical back: Normal range of motion and neck supple.  Skin:    General: Skin is dry.  Neurological:     General: No focal deficit present.     Mental Status: He is alert and oriented to person, place, and time.  Psychiatric:        Attention and Perception: Attention and perception normal.  Mood and Affect: Mood is anxious and depressed. Affect is flat and tearful.        Speech: Speech normal.        Behavior: Behavior is withdrawn. Behavior is cooperative.        Cognition and Memory: Cognition and memory normal.        Judgment: Judgment is impulsive and inappropriate.    Review of Systems  Psychiatric/Behavioral:  Positive for depression and suicidal ideas. Negative for hallucinations, memory loss and substance abuse. The patient is nervous/anxious and has insomnia.   All other systems reviewed and are negative.  Blood pressure 129/75, pulse 72, temperature 97.8 F (36.6 C), temperature source Oral, resp. rate 20, height '5\' 7"'$  (1.702 m), weight 87.5 kg, SpO2 99 %. Body mass index is 30.23 kg/m.  Treatment Plan Summary: Daily contact with patient to assess and evaluate symptoms and progress in treatment  and Medication management Major depressive disorder, recurrent, severe without psychosis: Effexor 150 mg daily Geodon 40 mg BID  EPS: Cogentin 0.5 mg BID  Insomnia: Remeron 45 mg daily  Anxiety: Valium 5 mg daily Hydroxyzine 25 mg TID PRN  Disposition: Recommend psychiatric Inpatient admission when medically cleared. Supportive therapy provided about ongoing stressors. Discussed crisis plan, support from social network, calling 911, coming to the Emergency Department, and calling Suicide Hotline.  Waylan Boga, NP 07/13/2022 10:29 AM

## 2022-07-13 NOTE — ED Notes (Signed)
Pt transferring to [facility]. Admission discussed with pt and he verbalized understanding. Given all his personal belongings. Stable, ambulatory and in NAD. Escorted out to safe transport vehicle.

## 2022-07-13 NOTE — Progress Notes (Signed)
While waiting to take his medications at the medication window, patient stated, "I need to sit down." He reported he felt like he was going to fall. Patient redirected to the dayroom to sit down and support provided. Oriented patient to the call bell in his room in case he feels he needs assistance, to prevent a fall. He denies any falls in the past six months.

## 2022-07-13 NOTE — Plan of Care (Signed)
  Problem: Education: Goal: Knowledge of [facility] Education information/materials will improve Outcome: Progressing Goal: Verbalization of understanding the information provided will improve Outcome: Progressing   Problem: Activity: Goal: Interest or engagement in activities will improve Outcome: Progressing Goal: Sleeping patterns will improve Outcome: Progressing

## 2022-07-13 NOTE — BH Assessment (Signed)
Patient has been accepted to [facility].  Patient assigned to room 503-01 Accepting physician is Dr. Winfred Leeds.  Call report to [phone].  Representative was [employer].   ER Staff is aware of it:  [place], ER Lenox Ahr, Patient's Nurse   Address: [address] [place], [place] [address]  Room will be ready by 2pm.

## 2022-07-14 ENCOUNTER — Encounter (HOSPITAL_COMMUNITY): Payer: Self-pay

## 2022-07-14 DIAGNOSIS — F332 Major depressive disorder, recurrent severe without psychotic features: Secondary | ICD-10-CM

## 2022-07-14 LAB — GLUCOSE, CAPILLARY
Glucose-Capillary: 115 mg/dL — ABNORMAL HIGH (ref 70–99)
Glucose-Capillary: 103 mg/dL — ABNORMAL HIGH (ref 70–99)

## 2022-07-14 MED ORDER — ZIPRASIDONE MESYLATE 20 MG IM SOLR: 20.0000 mg | INTRAMUSCULAR | Status: DC | PRN

## 2022-07-14 MED ORDER — ILOPERIDONE 4 MG PO TABS
8.0000 mg | ORAL_TABLET | Freq: Two times a day (BID) | ORAL | Status: AC
  Administered 2022-07-14 – 2022-07-15 (×3): 8 mg via ORAL
  Filled 2022-07-14 (×3): qty 2

## 2022-07-14 MED ORDER — INSULIN ASPART 100 UNIT/ML IJ SOLN
0.0000 [IU] | Freq: Three times a day (TID) | INTRAMUSCULAR | Status: DC
  Administered 2022-07-15: 3 [IU] via SUBCUTANEOUS
  Administered 2022-07-16: 2 [IU] via SUBCUTANEOUS
  Administered 2022-07-16 – 2022-07-18 (×4): 3 [IU] via SUBCUTANEOUS
  Administered 2022-07-19 (×2): 2 [IU] via SUBCUTANEOUS
  Administered 2022-07-19 – 2022-07-20 (×2): 3 [IU] via SUBCUTANEOUS
  Administered 2022-07-20: 2 [IU] via SUBCUTANEOUS
  Administered 2022-07-21: 3 [IU] via SUBCUTANEOUS
  Administered 2022-07-21 – 2022-07-22 (×2): 2 [IU] via SUBCUTANEOUS

## 2022-07-14 MED ORDER — LORAZEPAM 1 MG PO TABS: 1.0000 mg | ORAL_TABLET | ORAL | Status: DC | PRN

## 2022-07-14 MED ORDER — VENLAFAXINE HCL ER 75 MG PO CP24
75.0000 mg | ORAL_CAPSULE | Freq: Every day | ORAL | Status: DC
  Administered 2022-07-14 – 2022-07-22 (×9): 75 mg via ORAL
  Filled 2022-07-14 (×13): qty 1

## 2022-07-14 MED ORDER — ILOPERIDONE 4 MG PO TABS
2.0000 mg | ORAL_TABLET | Freq: Two times a day (BID) | ORAL | Status: DC
  Filled 2022-07-14 (×2): qty 1

## 2022-07-14 MED ORDER — LOSARTAN POTASSIUM 50 MG PO TABS
50.0000 mg | ORAL_TABLET | Freq: Every day | ORAL | Status: DC
  Administered 2022-07-14 – 2022-07-22 (×9): 50 mg via ORAL
  Filled 2022-07-14 (×12): qty 1

## 2022-07-14 MED ORDER — ILOPERIDONE 4 MG PO TABS
6.0000 mg | ORAL_TABLET | Freq: Two times a day (BID) | ORAL | Status: AC
  Administered 2022-07-16 (×2): 6 mg via ORAL
  Filled 2022-07-14 (×2): qty 2

## 2022-07-14 MED ORDER — GEMFIBROZIL 600 MG PO TABS
600.0000 mg | ORAL_TABLET | Freq: Two times a day (BID) | ORAL | Status: DC
  Administered 2022-07-14 – 2022-07-22 (×16): 600 mg via ORAL
  Filled 2022-07-14 (×27): qty 1

## 2022-07-14 MED ORDER — GABAPENTIN 300 MG PO CAPS
600.0000 mg | ORAL_CAPSULE | Freq: Three times a day (TID) | ORAL | Status: DC
  Administered 2022-07-14 – 2022-07-22 (×24): 600 mg via ORAL
  Filled 2022-07-14 (×39): qty 2

## 2022-07-14 MED ORDER — OLANZAPINE 5 MG PO TBDP: 5.0000 mg | ORAL_TABLET | Freq: Three times a day (TID) | ORAL | Status: DC | PRN

## 2022-07-14 MED ORDER — METFORMIN HCL 500 MG PO TABS
1000.0000 mg | ORAL_TABLET | Freq: Two times a day (BID) | ORAL | Status: DC
  Administered 2022-07-14 – 2022-07-22 (×16): 1000 mg via ORAL
  Filled 2022-07-14 (×22): qty 2

## 2022-07-14 MED ORDER — DIAZEPAM 5 MG PO TABS
5.0000 mg | ORAL_TABLET | Freq: Three times a day (TID) | ORAL | Status: DC
  Administered 2022-07-14 – 2022-07-18 (×12): 5 mg via ORAL
  Filled 2022-07-14 (×12): qty 1

## 2022-07-14 MED ORDER — GLIMEPIRIDE 4 MG PO TABS
4.0000 mg | ORAL_TABLET | Freq: Every day | ORAL | Status: DC
  Administered 2022-07-15 – 2022-07-17 (×3): 4 mg via ORAL
  Filled 2022-07-14 (×5): qty 1

## 2022-07-14 MED ORDER — ZIPRASIDONE HCL 20 MG PO CAPS
20.0000 mg | ORAL_CAPSULE | Freq: Two times a day (BID) | ORAL | Status: AC
  Administered 2022-07-14 – 2022-07-15 (×3): 20 mg via ORAL
  Filled 2022-07-14 (×4): qty 1

## 2022-07-14 MED ORDER — MIRTAZAPINE 30 MG PO TABS
30.0000 mg | ORAL_TABLET | Freq: Every day | ORAL | Status: DC
  Administered 2022-07-14 – 2022-07-21 (×8): 30 mg via ORAL
  Filled 2022-07-14 (×14): qty 1

## 2022-07-14 MED ORDER — ABACAVIR-DOLUTEGRAVIR-LAMIVUD 600-50-300 MG PO TABS
1.0000 | ORAL_TABLET | Freq: Every day | ORAL | Status: DC
  Administered 2022-07-14 – 2022-07-22 (×9): 1 via ORAL
  Filled 2022-07-14 (×12): qty 1

## 2022-07-14 MED ORDER — DOCUSATE SODIUM 100 MG PO CAPS
200.0000 mg | ORAL_CAPSULE | Freq: Two times a day (BID) | ORAL | Status: DC
  Administered 2022-07-14 – 2022-07-22 (×14): 200 mg via ORAL
  Filled 2022-07-14 (×21): qty 2

## 2022-07-14 MED ORDER — SUCRALFATE 1 G PO TABS: 1.0000 g | ORAL_TABLET | Freq: Four times a day (QID) | ORAL | Status: DC | PRN

## 2022-07-14 MED ORDER — ILOPERIDONE 4 MG PO TABS
4.0000 mg | ORAL_TABLET | Freq: Two times a day (BID) | ORAL | Status: DC
  Administered 2022-07-17: 4 mg via ORAL
  Filled 2022-07-14 (×2): qty 1

## 2022-07-14 NOTE — BHH Counselor (Signed)
Adult Comprehensive Assessment  Patient ID: Danny Ryland., male   DOB: 10/01/1960, 62 y.o.   MRN: [id number]  Information Source: Information source: Patient  Current Stressors:  Patient states their primary concerns and needs for treatment are:: suicidal thoughts and depression Patient states their goals for this hospitilization and ongoing recovery are:: work on depression Educational / Learning stressors: N/A Employment / Job issues: N/A Family Relationships: Patient states that he has an okay relationship with family Museum/gallery curator / Lack of resources (include bankruptcy): disability Housing / Lack of housing: own apartment, hard to be alone Physical health (include injuries & life threatening diseases): heart failure, CPAP issues, diabetic, High Blood Pressure, HIV Social relationships: pt states that he has peer support in [place] Substance abuse: N/A Bereavement / Loss: none  Living/Environment/Situation:  Living Arrangements: Alone Who else lives in the home?: none How long has patient lived in current situation?: 10 months 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 Pt reported having a "difficult," chidlhood.  Description of patient's relationship with caregiver when they were a child: "not too good" How were you disciplined when you got in trouble as a child/adolescent?: "pretty hard" Does patient have siblings?: Yes Number of Siblings: 2 Description of patient's current relationship with  siblings: "good" Did patient suffer any verbal/emotional/physical/sexual abuse as a child?: No Did patient suffer from severe childhood neglect?: No Has patient ever been sexually abused/assaulted/raped as an adolescent or adult?: No Was the patient ever a victim of a crime or a disaster?: No Witnessed domestic violence?: No Has patient been affected by domestic violence as an adult?: No  Education:  Highest grade of school patient has completed: GED Currently a Ship broker?: No Learning disability?: No  Employment/Work Situation:   Employment Situation: Unemployed Patient's Job has Been Impacted by Current Illness: No What is the Longest Time Patient has Held a Job?: "6 month" 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, television/film set" Has Patient ever Been in the [employer]?: No  Financial Resources:   Museum/gallery curator resources: [employer], Marine scientist SSDI Does patient have a Programmer, applications or guardian?: No  Alcohol/Substance Abuse:   What has been your use of drugs/alcohol within the last 12 months?: none If attempted suicide, did drugs/alcohol play a role in this?: No Alcohol/Substance Abuse Treatment Hx: Denies past history Has alcohol/substance abuse ever caused legal problems?: No  Social Support System:   Pensions consultant Support System: Fair Astronomer System: peer support, siblings Type of faith/religion: Physicist, medical  Leisure/Recreation:   Do You Have Hobbies?: Yes Leisure and Hobbies: "watching television, listening to music"  Strengths/Needs:   What is the patient's perception of their strengths?: determined  Discharge Plan:   Currently receiving community mental health services: Yes (From Whom) ([place]) Does patient have access to transportation?: Yes Does patient have financial barriers related to discharge medications?: No Plan for living situation after discharge: Patient would like a group home Will patient be  returning to same living situation after discharge?: No  Summary/Recommendations:   Summary  and Recommendations (to be completed by the evaluator): Danny Cook is a 62 year old male who was admitted to [facility] for increased depression and suicidal ideation.  Patient states that he can't live alone anymore and would like to return to a group home.  Patient states he is connected to peer support but feels like he gets lonely.  Patient has a psychiatric diagnosis of major depressive disorder. Patient is also diagnosed with high blood pressure, heart disease and HIV.  He reports trauma in childhood.  Patient reports a good relationship with siblings. Patient is connected with [facility] for med management.While here, Danny Cook can benefit from crisis stabilization, medication management, therapeutic milieu, and referrals for services.   Danny Cook E Erin Obando. 07/14/2022

## 2022-07-14 NOTE — BHH Group Notes (Signed)
Pt attended [place] group

## 2022-07-14 NOTE — BH IP Treatment Plan (Signed)
Interdisciplinary Treatment and Diagnostic Plan Update  07/14/2022 Time of Session: 10:10am Danny Cook. MRN: [id number]  Principal Diagnosis: Schizoaffective disorder, depressive type ([place])  Secondary Diagnoses: Principal Problem:   Schizoaffective disorder, depressive type ([place])   Current Medications:  Current Facility-Administered Medications  Medication Dose Route Frequency Provider Last Rate Last Admin   acetaminophen (TYLENOL) tablet 650 mg  650 mg Oral Q6H PRN Deloria Lair, NP   650 mg at 07/13/22 1719   albuterol (VENTOLIN HFA) 108 (90 Base) MCG/ACT inhaler 2 puff  2 puff Inhalation Q6H PRN Deloria Lair, NP       alum & mag hydroxide-simeth (MAALOX/MYLANTA) 200-200-20 MG/5ML suspension 30 mL  30 mL Oral Q4H PRN Deloria Lair, NP       aspirin EC tablet 325 mg  325 mg Oral Daily Dixon, Rashaun M, NP   325 mg at 07/14/22 0810   benztropine (COGENTIN) tablet 1.5 mg  1.5 mg Oral Daily Anette Riedel M, NP   1.5 mg at 07/14/22 0810   brimonidine (ALPHAGAN) 0.2 % ophthalmic solution 1 drop  1 drop Both Eyes Q12H Dixon, Ernst Bowler, NP   1 drop at 07/14/22 0811   And   timolol (TIMOPTIC) 0.5 % ophthalmic solution 1 drop  1 drop Both Eyes Q12H Deloria Lair, NP   1 drop at 07/14/22 0017   diazepam (VALIUM) tablet 5 mg  5 mg Oral TID Deloria Lair, NP   5 mg at 07/14/22 4944   dicyclomine (BENTYL) capsule 10 mg  10 mg Oral TID AC & HS Dixon, Rashaun M, NP   10 mg at 07/14/22 0700   divalproex (DEPAKOTE ER) 24 hr tablet 500 mg  500 mg Oral QHS Dixon, Rashaun M, NP   500 mg at 07/13/22 2100   docusate sodium (COLACE) capsule 200 mg  200 mg Oral BID Janine Limbo, MD   200 mg at 07/14/22 9675   hydrOXYzine (ATARAX) tablet 25 mg  25 mg Oral TID PRN Deloria Lair, NP       OLANZapine zydis (ZYPREXA) disintegrating tablet 5 mg  5 mg Oral Q8H PRN Massengill, Ovid Curd, MD       And   LORazepam (ATIVAN) tablet 1 mg  1 mg Oral PRN Massengill, Ovid Curd, MD       And    ziprasidone (GEODON) injection 20 mg  20 mg Intramuscular PRN Massengill, Ovid Curd, MD       magnesium hydroxide (MILK OF MAGNESIA) suspension 30 mL  30 mL Oral Daily PRN Deloria Lair, NP       meclizine (ANTIVERT) tablet 25 mg  25 mg Oral TID PRN Deloria Lair, NP       memantine (NAMENDA) tablet 10 mg  10 mg Oral BID Doren Custard, Rashaun M, NP   10 mg at 07/14/22 0812   mirtazapine (REMERON) tablet 45 mg  45 mg Oral QHS Dixon, Rashaun M, NP   45 mg at 07/13/22 2200   pantoprazole (PROTONIX) EC tablet 40 mg  40 mg Oral Daily Dixon, Rashaun M, NP   40 mg at 07/14/22 9163   rosuvastatin (CRESTOR) tablet 40 mg  40 mg Oral Daily Anette Riedel M, NP   40 mg at 07/14/22 8466   Semaglutide (1 MG/DOSE) SOPN 2 mg  2 mg Subcutaneous Q Mon Dixon, Rashaun M, NP       senna (SENOKOT) tablet 8.6 mg  1 tablet Oral Daily Deloria Lair, NP   8.6  mg at 07/14/22 0813   sucralfate (CARAFATE) tablet 1 g  1 g Oral QID Anette Riedel M, NP   1 g at 07/14/22 0813   tamsulosin (FLOMAX) capsule 0.4 mg  0.4 mg Oral Daily Dixon, Rashaun M, NP   0.4 mg at 07/14/22 0813   venlafaxine XR (EFFEXOR-XR) 24 hr capsule 75 mg  75 mg Oral Q breakfast Massengill, Ovid Curd, MD   75 mg at 07/14/22 0813   ziprasidone (GEODON) capsule 40 mg  40 mg Oral BID WC Dixon, Rashaun M, NP   40 mg at 07/14/22 0813   PTA Medications: Medications Prior to Admission  Medication Sig Dispense Refill Last Dose   abacavir-dolutegravir-lamiVUDine (TRIUMEQ) 600-50-300 MG tablet Take 1 tablet by mouth at bedtime. 30 tablet 6    albuterol (PROVENTIL HFA;VENTOLIN HFA) 108 (90 Base) MCG/ACT inhaler Inhale 2 puffs into the lungs every 6 (six) hours as needed for wheezing or shortness of breath. 1 Inhaler 2    aspirin EC 325 MG tablet Take 325 mg by mouth daily.      benztropine (COGENTIN) 0.5 MG tablet Take 1.5 mg by mouth daily. Pt unsure of dose      brimonidine-timolol (COMBIGAN) 0.2-0.5 % ophthalmic solution Place 1 drop into both eyes every 12  (twelve) hours.      diazepam (VALIUM) 5 MG tablet Take 1 tablet (5 mg total) by mouth 3 (three) times daily. 30 tablet 0    dicyclomine (BENTYL) 10 MG capsule Take 10 mg by mouth 4 (four) times daily -  before meals and at bedtime.      divalproex (DEPAKOTE ER) 500 MG 24 hr tablet Take 500 mg by mouth at bedtime.      docusate sodium (COLACE) 250 MG capsule Take 250 mg by mouth 2 (two) times daily.      donepezil (ARICEPT) 10 MG tablet Take 5 mg by mouth daily.      FANAPT 12 MG TABS Take 12 mg by mouth 2 (two) times daily.      Fluocinolone Acetonide 0.01 % OIL Place 2-4 drops in ear(s) every other day. At bedtime for dry skin      fluticasone (FLOVENT HFA) 220 MCG/ACT inhaler Inhale 2 puffs into the lungs 2 (two) times daily. Rinse out mouth afterwards 1 Inhaler 12    gabapentin (NEURONTIN) 400 MG capsule Take 2 capsules (800 mg total) by mouth 3 (three) times daily. 180 capsule 2    gemfibrozil (LOPID) 600 MG tablet Take 600 mg by mouth 2 (two) times daily before a meal.      glimepiride (AMARYL) 4 MG tablet Take 4 mg by mouth daily with breakfast.      glycopyrrolate (ROBINUL) 2 MG tablet Take 0.5 mg by mouth 2 (two) times daily.      hydrOXYzine (ATARAX) 25 MG tablet Take 1 tablet (25 mg total) by mouth 3 (three) times daily as needed for anxiety. (Patient not taking: Reported on 06/10/2022) 30 tablet 0    Insulin Degludec (TRESIBA FLEXTOUCH) 200 UNIT/ML SOPN Inject 10 Units into the skin daily. (Patient taking differently: Inject 34 Units into the skin daily.)      losartan (COZAAR) 50 MG tablet Take 50 mg by mouth daily.      meclizine (ANTIVERT) 25 MG tablet Take 1 tablet (25 mg total) by mouth 3 (three) times daily as needed for dizziness. 30 tablet 0    memantine (NAMENDA) 10 MG tablet Take 10 mg by mouth 2 (two) times daily.  metFORMIN (GLUCOPHAGE) 1000 MG tablet Take 1,000 mg by mouth 2 (two) times daily with a meal.      methocarbamol (ROBAXIN) 500 MG tablet Take 1 tablet (500 mg  total) by mouth 4 (four) times daily. (Patient not taking: Reported on 06/10/2022) 120 tablet 0    mirtazapine (REMERON) 45 MG tablet Take 1 tablet (45 mg total) by mouth at bedtime. 90 tablet 2    mometasone (ELOCON) 0.1 % ointment Apply 1 application topically See admin instructions. Apply 4 drops to ear canal at bedtime every other night for itching and dry skin. (Patient not taking: Reported on 06/10/2022)      nitroGLYCERIN (NITROSTAT) 0.4 MG SL tablet Place 0.4 mg under the tongue every 5 (five) minutes as needed for chest pain. (Patient not taking: Reported on 07/12/2022)      Omega-3 Fatty Acids (FISH OIL) 1000 MG CAPS Take 1,000 mg by mouth daily.      omeprazole (PRILOSEC) 40 MG capsule Take 40 mg by mouth 2 (two) times daily.      OZEMPIC, 1 MG/DOSE, 4 MG/3ML SOPN Inject 2 mg into the skin every Monday.      rosuvastatin (CRESTOR) 40 MG tablet Take 40 mg by mouth daily.       senna (SENOKOT) 8.6 MG TABS tablet Take 1 tablet by mouth daily.      sucralfate (CARAFATE) 1 g tablet Take 1 g by mouth 4 (four) times daily.      tamsulosin (FLOMAX) 0.4 MG CAPS capsule Take 0.4 mg by mouth daily.      venlafaxine XR (EFFEXOR-XR) 150 MG 24 hr capsule Take 1 capsule (150 mg total) by mouth daily with breakfast. 30 capsule 2    ziprasidone (GEODON) 40 MG capsule Take 1 capsule (40 mg total) by mouth 2 (two) times daily with a meal. 60 capsule 2     Patient Stressors: Financial difficulties   Other: does not like living alone    Patient Strengths: Ability for insight  Armed forces logistics/support/administrative officer  Motivation for treatment/growth   Treatment Modalities: Medication Management, Group therapy, Case management,  1 to 1 session with clinician, Psychoeducation, Recreational therapy.   Physician Treatment Plan for Primary Diagnosis: Schizoaffective disorder, depressive type ([place]) Long Term Goal(s):     Short Term Goals:    Medication Management: Evaluate patient's response, side effects, and tolerance of  medication regimen.  Therapeutic Interventions: 1 to 1 sessions, Unit Group sessions and Medication administration.  Evaluation of Outcomes: Progressing  Physician Treatment Plan for Secondary Diagnosis: Principal Problem:   Schizoaffective disorder, depressive type ([place])  Long Term Goal(s):     Short Term Goals:       Medication Management: Evaluate patient's response, side effects, and tolerance of medication regimen.  Therapeutic Interventions: 1 to 1 sessions, Unit Group sessions and Medication administration.  Evaluation of Outcomes: Progressing   RN Treatment Plan for Primary Diagnosis: Schizoaffective disorder, depressive type ([place]) Long Term Goal(s): Knowledge of disease and therapeutic regimen to maintain health will improve  Short Term Goals: Ability to remain free from injury will improve, Ability to verbalize frustration and anger appropriately will improve, Ability to demonstrate self-control, Ability to participate in decision making will improve, Ability to verbalize feelings will improve, Ability to disclose and discuss suicidal ideas, Ability to identify and develop effective coping behaviors will improve, and Compliance with prescribed medications will improve  Medication Management: RN will administer medications as ordered by provider, will assess and evaluate patient's response and provide education  to patient for prescribed medication. RN will report any adverse and/or side effects to prescribing provider.  Therapeutic Interventions: 1 on 1 counseling sessions, Psychoeducation, Medication administration, Evaluate responses to treatment, Monitor vital signs and CBGs as ordered, Perform/monitor CIWA, COWS, AIMS and Fall Risk screenings as ordered, Perform wound care treatments as ordered.  Evaluation of Outcomes: Progressing   LCSW Treatment Plan for Primary Diagnosis: Schizoaffective disorder, depressive type ([place]) Long Term Goal(s): Safe transition to  appropriate next level of care at discharge, Engage patient in therapeutic group addressing interpersonal concerns.  Short Term Goals: Engage patient in aftercare planning with referrals and resources, Increase social support, Increase ability to appropriately verbalize feelings, Increase emotional regulation, Facilitate acceptance of mental health diagnosis and concerns, Facilitate patient progression through stages of change regarding substance use diagnoses and concerns, and Identify triggers associated with mental health/substance abuse issues  Therapeutic Interventions: Assess for all discharge needs, 1 to 1 time with Social worker, Explore available resources and support systems, Assess for adequacy in community support network, Educate family and significant other(s) on suicide prevention, Complete Psychosocial Assessment, Interpersonal group therapy.  Evaluation of Outcomes: Progressing   Progress in Treatment: Attending groups: Yes. Participating in groups: Yes. Taking medication as prescribed: Yes. Toleration medication: Yes. Family/Significant other contact made: No, will contact:  [name] will assess and identify social support  Patient understands diagnosis: Yes. Discussing patient identified problems/goals with staff: Yes. Medical problems stabilized or resolved: No. Patient has acute shoulder pain. Nursing reports inability to stand for long periods of time and unsteady on feet.  Denies suicidal/homicidal ideation: Yes. Issues/concerns per patient self-inventory: No.  New problem(s) identified: No, Describe:  none reported  New Short Term/Long Term Goal(s):   medication stabilization, elimination of SI thoughts, development of comprehensive mental wellness plan.    Patient Goals:  Pt states, "I need to work on depression and housing situation"  Discharge Plan or Barriers: Patient does not like living on his own.   Reason for Continuation of Hospitalization:  Anxiety Depression Medication stabilization Suicidal ideation  Estimated Length of Stay: 5-7 days  Last 3 [place] Suicide Severity Risk Score: [place] Admission (Current) from 07/13/2022 in [place] [facility] ED from 07/12/2022 in [facility] ED from 07/10/2022 in [facility] Low Risk Moderate Risk No Risk       Last PHQ 2/9 Scores:    09/13/2021    9:02 AM 03/05/2021    9:21 AM 01/26/2018   11:03 AM  Depression screen PHQ 2/9  Decreased Interest 0 0 0  Down, Depressed, Hopeless 0 0 1  PHQ - 2 Score 0 0 1    Scribe for Treatment Team: Zachery Conch, LCSW 07/14/2022 11:12 AM

## 2022-07-14 NOTE — Progress Notes (Signed)
   07/14/22 1600  Psych Admission Type (Psych Patients Only)  Admission Status Voluntary  Psychosocial Assessment  Patient Complaints Depression  Eye Contact Fair  Facial Expression Anxious  Affect Anxious  Speech Logical/coherent  Interaction Minimal  Motor Activity Slow  Appearance/Hygiene Unremarkable  Behavior Characteristics Anxious  Mood Anxious;Depressed  Thought Process  Coherency WDL  Content WDL  Delusions None reported or observed  Perception WDL  Hallucination None reported or observed  Judgment Poor  Confusion WDL  Danger to Self  Current suicidal ideation? Passive  Self-Injurious Behavior No self-injurious ideation or behavior indicators observed or expressed   Agreement Not to Harm Self Yes  Description of Agreement verbally contracts for safety  Danger to Others  Danger to Others None reported or observed

## 2022-07-14 NOTE — Group Note (Signed)
Recreation Therapy Group Note   Group Topic:Anger Management  Group Date: 07/14/2022 Start Time: 1005 End Time: 9357 Facilitators: Felicha Frayne-McCall, LRT,CTRS Location: 500 [address] Dayroom   Goal Area(s) Addresses:  Patient will identify triggers for anger.  Patient will identify a situation that makes them angry.  Patient will identify positive coping skills to deal with anger.  Group Description: Patients discussed anger, what makes them angry, and what other emotions come with anger. Patients were then given an anger thermometer.  Patients were to rank the things that get them angry on the thermometer from 1-10, one being the least and 10 being the most angry.  Patients were to then identify 5 positive coping skills they could use to help them deal with anger/frustration.   Affect/Mood: Flat   Participation Level: Minimal   Participation Quality: Independent   Behavior: Withdrawn   Speech/Thought Process: Barely audible    Insight: Fair   Judgement: Fair    Modes of Intervention: Worksheet   Patient Response to Interventions:  Engaged   Education Outcome:  Acknowledges education and In group clarification offered    Clinical Observations/Individualized Feedback: Pt appeared flat and depressed during group.  Pt identified becoming paranoid and rated it a 10, making him do things at 9 and boundaries at 8.  Pt then expressed he was unable to concentrate until he spoke with the doctor.  Pt then asked if he could leave group to which he was excused and did not return.     Plan: Continue to engage patient in RT group sessions 2-3x/week.   Danny Cook, LRT,CTRS 07/14/2022 1:46 PM

## 2022-07-14 NOTE — Inpatient Diabetes Management (Addendum)
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 Continues to be suicidal, agreeable to inpatient psychiatry for stabilization.   History: DM  Home DM Meds: Amaryl 4 mg daily       Tresiba 34 units Daily       Metformin 1000 mg BID       Ozempic 2 mg Qweek  Current Orders: Amaryl 4 mg daily       Metformin 1000 mg BID       Ozempic 2 mg Qweek    MD- Note sure pt will be able to get Ozempic in-hospital.  May have to bring supply from home.  Please check with Pharmacy.  Needs orders for CBG checks TID AC + HS  May also consider adding Novolog Sensitive Correction Scale/ SSI (0-9 units) TID AC + HS   Endocrinologist: Dr. Honor Junes- Last seen June 2023 Was told to Stop Amaryl, Stop Humalog Reduce Tresiba to 34 units daily (if lows persist drop to 30 units) Continue Ozempic and Metformin    --Will follow patient during hospitalization--  Wyn Quaker RN, MSN, [facility] Coordinator Inpatient Glycemic Control Team Team Pager: [phone] (8a-5p)

## 2022-07-14 NOTE — Progress Notes (Signed)
Pt is A&OX4, calm, unsteady gait at times (Pt ambulates with caution), denies suicidal ideations, denies homicidal ideations, denies auditory hallucinations and denies visual hallucinations. Pt verbally agrees to approach staff if these become apparent and before harming self or others. Pt denies experiencing nightmares. Mood and affect are congruent. Pt appetite is ok. No complaints of anxiety, distress, pain and/or discomfort at this time. Pt's memory appears to be grossly intact, and Pt hasn't displayed any injurious behaviors. Pt is medication compliant. There's no evidence of suicidal intent. Psychomotor activity was WNL. No s/s of Parkinson, Dystonia, Akathisia and/or Tardive Dyskinesia noted.

## 2022-07-14 NOTE — [facility] Suicide Risk Assessment (Signed)
Suicide Risk Assessment  Admission Assessment    [facility] Admission Suicide Risk Assessment   Nursing information obtained from:  Patient Demographic factors:  Male, Caucasian, Living alone Current Mental Status:  Suicidal ideation indicated by patient Loss Factors:  NA Historical Factors:  NA Risk Reduction Factors:  Sense of responsibility to family, Positive social support, Positive therapeutic relationship, Positive coping skills or problem solving skills  Total Time spent with patient: 1 hour Principal Problem: Severe episode of recurrent major depressive disorder, without psychotic features ([place]) Diagnosis:  Principal Problem:   Severe episode of recurrent major depressive disorder, without psychotic features ([place]) Active Problems:   Chronic diastolic heart failure (HCC)   HTN (hypertension)   Diabetes ([place])   HIV positive ([place])   Generalized anxiety disorder   Subjective Data:   Danny Cook. is a 62 y.o., male with a past psychiatric history significant for MDD with psychotic features who presents to the [facility] from  [facility] for evaluation and management of SI with plan to overdose on medications.    Initial assessment on 1/15, patient was evaluated on the inpatient unit, the patient reports worsening depression anxiety for the past few weeks due to being isolated in his apartment and increasing burden to care for himself.  He reports that he previously lived in a group home but 10 months ago, he moved into an apartment where he lives by himself.  During this transition, he reports his depression started becoming more consistent.  He tried to apply to obtain home health but it was denied multiple times and he is unsure why.  His depressive symptoms have been worsening over the past few weeks and during this time, he is having passive SI.  Last week, he reported having active SI with a plan to overdose on his Valium.  He went as  far as to putting pills in his hand but stopped himself from putting them in his mouth. After that, he called the ED.  He reports that he does not want to die but he does not know another way out of his situation.  He feels that his sleep has been worsening, endorsing 2 hours of sleep every night.  He reports taking about an hour to fall asleep initially and waking up 2-3 times in the middle of the night and then taking a few hours to fall back asleep.  He also reports anhedonia, feelings of hopelessness regarding his living situation, low energy, poor concentration and poor appetite.  He reports that when he lives in the group home, his depression was much improved.  He also endorses elevated anxiety in the past few weeks specifically about living alone and his home health needs.  Denies a history of panic attacks.  He reports occasional paranoia when he is at home due to feeling that someone will break in.  He reports that when he is not living alone, he denies paranoia.  Continued Clinical Symptoms:  Alcohol Use Disorder Identification Test Final Score (AUDIT): 0 The "Alcohol Use Disorders Identification Test", Guidelines for Use in Primary Care, Second Edition.  World Pharmacologist [facility]). Score between 0-7:  no or low risk or alcohol related problems. Score between 8-15:  moderate risk of alcohol related problems. Score between 16-19:  high risk of alcohol related problems. Score 20 or above:  warrants further diagnostic evaluation for alcohol dependence and treatment.   CLINICAL FACTORS:   Severe Anxiety and/or Agitation Depression:   Anhedonia  Hopelessness Insomnia Chronic Pain   Musculoskeletal: Strength & Muscle Tone: within normal limits Gait & Station: normal Patient leans: N/A  Psychiatric Specialty Exam:  General Appearance: appears at stated age, casually dressed and groomed    Behavior: pleasant and cooperative    Psychomotor Activity: no psychomotor agitation or  retardation noted    Eye Contact: fair  Speech: normal amount, tone, volume and fluency      Mood: depressed Affect: congruent   Thought Process: some difficulty although goal directed Descriptions of Associations: intact    Thought Content Hallucinations: denies AH, VH , does not appear responding to stimuli  Delusions: no paranoia, delusions of control, grandeur, ideas of reference, thought broadcasting, and magical thinking  Suicidal Thoughts: denies SI, intention, plan. Most recently having active SI was yesterday afternoon Homicidal Thoughts: denies HI, intention, plan    Alertness/Orientation: alert and fully oriented    Insight: limited Judgment: limited   Memory: limited    Executive Functions  Concentration: intact  Attention Span: fair  Recall: intact  Fund of Knowledge: fair    Physical Exam  Constitutional:      Appearance: Normal appearance.  Cardiovascular:     Rate and Rhythm: Normal rate.  Pulmonary:     Effort: Pulmonary effort is normal.  Neurological:     General: No focal deficit present.     Mental Status: Alert and oriented to person, place, and time.      Review of Systems  Constitutional: Negative.  Negative for chills, fever and weight loss.  HENT: Negative.    Eyes: Negative.   Respiratory: Negative.    Cardiovascular: Negative.   Gastrointestinal:  Negative for constipation, diarrhea, nausea and vomiting.  Genitourinary: Negative.   Musculoskeletal: Negative.   Skin: Negative.   Neurological: Negative.  Negative for tingling.    COGNITIVE FEATURES THAT CONTRIBUTE TO RISK:  None    SUICIDE RISK:  Severe:  Frequent, intense, and enduring suicidal ideation, specific plan, no subjective intent, but some objective markers of intent (i.e., choice of lethal method), the method is accessible, some limited preparatory behavior, evidence of impaired self-control, severe dysphoria/symptomatology, multiple risk factors present, and few if any  protective factors, particularly a lack of social support.  PLAN OF CARE: See H&P for assessment and plan.   I certify that inpatient services furnished can reasonably be expected to improve the patient's condition.   Alesia Morin, MD 07/14/2022, 3:14 PM

## 2022-07-14 NOTE — Plan of Care (Signed)
  Problem: Safety: Goal: Periods of time without injury will increase Outcome: Progressing

## 2022-07-14 NOTE — Progress Notes (Signed)
   07/14/22 0000  Psych Admission Type (Psych Patients Only)  Admission Status Voluntary  Psychosocial Assessment  Patient Complaints Depression;Anxiety  Eye Contact Fair  Facial Expression Anxious  Affect Anxious  Speech Logical/coherent  Interaction Minimal  Motor Activity Slow  Appearance/Hygiene In scrubs  Behavior Characteristics Cooperative;Appropriate to situation  Mood Anxious;Depressed  Thought Process  Coherency WDL  Content WDL  Delusions None reported or observed  Perception WDL  Hallucination None reported or observed  Judgment Impaired  Confusion WDL  Danger to Self  Current suicidal ideation? Passive  Self-Injurious Behavior No self-injurious ideation or behavior indicators observed or expressed   Agreement Not to Harm Self Yes  Description of Agreement verbally contracts for safety  Danger to Others  Danger to Others None reported or observed   Alert/oriented. Makes needs/concerns known to staff. Pleasant cooperative with staff. Denies HI/A/V hallucinations but states has thoughts of hurting self at times. Pt has verbally contracted for safety. Med compliant. Will encourage continue compliance and progression towards goals. Will continue to monitor.

## 2022-07-14 NOTE — H&P (Cosign Needed Addendum)
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 of breath. 1 Inhaler 2    aspirin EC 325 MG tablet Take 325 mg by mouth daily.      benztropine (COGENTIN) 0.5 MG tablet Take 1.5 mg by mouth daily. Pt unsure of dose      brimonidine-timolol (COMBIGAN) 0.2-0.5 % ophthalmic solution Place 1 drop into both eyes every 12 (twelve) hours.      diazepam (VALIUM) 5 MG tablet Take 1 tablet (5 mg total) by mouth 3 (three) times daily. 30 tablet 0    dicyclomine (BENTYL) 10 MG capsule Take 10 mg by mouth 4 (four) times daily -  before meals and at bedtime.      divalproex (DEPAKOTE ER) 500 MG 24 hr tablet Take 500 mg by mouth at bedtime.      docusate sodium (COLACE) 250 MG capsule Take 250 mg by mouth 2 (two) times daily.      donepezil  (ARICEPT) 10 MG tablet Take 5 mg by mouth daily.      FANAPT 12 MG TABS Take 12 mg by mouth 2 (two) times daily.      Fluocinolone Acetonide 0.01 % OIL Place 2-4 drops in ear(s) every other day. At bedtime for dry skin      fluticasone (FLOVENT HFA) 220 MCG/ACT inhaler Inhale 2 puffs into the lungs 2 (two) times daily. Rinse out mouth afterwards 1 Inhaler 12  gabapentin (NEURONTIN) 400 MG capsule Take 2 capsules (800 mg total) by mouth 3 (three) times daily. 180 capsule 2    gemfibrozil (LOPID) 600 MG tablet Take 600 mg by mouth 2 (two) times daily before a meal.      glimepiride (AMARYL) 4 MG tablet Take 4 mg by mouth daily with breakfast.      glycopyrrolate (ROBINUL) 2 MG tablet Take 0.5 mg by mouth 2 (two) times daily.      hydrOXYzine (ATARAX) 25 MG tablet Take 1 tablet (25 mg total) by mouth 3 (three) times daily as needed for anxiety. (Patient not taking: Reported on 06/10/2022) 30 tablet 0    Insulin Degludec (TRESIBA FLEXTOUCH) 200 UNIT/ML SOPN Inject 10 Units into the skin daily. (Patient taking differently: Inject 34 Units into the skin daily.)      losartan (COZAAR) 50 MG tablet Take 50 mg by mouth daily.      meclizine (ANTIVERT) 25 MG tablet Take 1 tablet (25 mg total) by mouth 3 (three) times daily as needed for dizziness. 30 tablet 0    memantine (NAMENDA) 10 MG tablet Take 10 mg by mouth 2 (two) times daily.      metFORMIN (GLUCOPHAGE) 1000 MG tablet Take 1,000 mg by mouth 2 (two) times daily with a meal.      methocarbamol (ROBAXIN) 500 MG tablet Take 1 tablet (500 mg total) by mouth 4 (four) times daily. (Patient not taking: Reported on 06/10/2022) 120 tablet 0    mirtazapine (REMERON) 45 MG tablet Take 1 tablet (45 mg total) by mouth at bedtime. 90 tablet 2    mometasone (ELOCON) 0.1 % ointment Apply 1 application topically See admin instructions. Apply 4 drops to ear canal at bedtime every other night for itching and dry skin. (Patient not taking: Reported on 06/10/2022)       nitroGLYCERIN (NITROSTAT) 0.4 MG SL tablet Place 0.4 mg under the tongue every 5 (five) minutes as needed for chest pain. (Patient not taking: Reported on 07/12/2022)      Omega-3 Fatty Acids (FISH OIL) 1000 MG CAPS Take 1,000 mg by mouth daily.      omeprazole (PRILOSEC) 40 MG capsule Take 40 mg by mouth 2 (two) times daily.      OZEMPIC, 1 MG/DOSE, 4 MG/3ML SOPN Inject 2 mg into the skin every Monday.      rosuvastatin (CRESTOR) 40 MG tablet Take 40 mg by mouth daily.       senna (SENOKOT) 8.6 MG TABS tablet Take 1 tablet by mouth daily.      sucralfate (CARAFATE) 1 g tablet Take 1 g by mouth 4 (four) times daily.      tamsulosin (FLOMAX) 0.4 MG CAPS capsule Take 0.4 mg by mouth daily.      venlafaxine XR (EFFEXOR-XR) 150 MG 24 hr capsule Take 1 capsule (150 mg total) by mouth daily with breakfast. 30 capsule 2    ziprasidone (GEODON) 40 MG capsule Take 1 capsule (40 mg total) by mouth 2 (two) times daily with a meal. 60 capsule 2      Physical Findings: AIMS: No  CIWA:    COWS:     Mental Status Exam  General Appearance: appears at stated age, casually dressed and groomed   Behavior: pleasant and cooperative   Psychomotor Activity: no psychomotor agitation or retardation noted   Eye Contact: fair  Speech: normal amount, tone, volume and fluency    Mood: depressed Affect: congruent  Thought Process: some difficulty although  goal directed Descriptions of Associations: intact   Thought Content Hallucinations: denies AH, VH , does not appear responding to stimuli  Delusions: no paranoia, delusions of control, grandeur, ideas of reference, thought broadcasting, and magical thinking  Suicidal Thoughts: denies SI, intention, plan. Most recently having active SI was yesterday afternoon Homicidal Thoughts: denies HI, intention, plan   Alertness/Orientation: alert and fully oriented   Insight: limited Judgment: limited  Memory: limited   Executive Functions  Concentration:  intact  Attention Span: fair  Recall: intact  Fund of Knowledge: fair   Physical Exam  Constitutional:      Appearance: Normal appearance.  Cardiovascular:     Rate and Rhythm: Normal rate.  Pulmonary:     Effort: Pulmonary effort is normal.  Neurological:     General: No focal deficit present.     Mental Status: Alert and oriented to person, place, and time.    Review of Systems  Constitutional: Negative.  Negative for chills, fever and weight loss.  HENT: Negative.    Eyes: Negative.   Respiratory: Negative.    Cardiovascular: Negative.   Gastrointestinal:  Negative for constipation, diarrhea, nausea and vomiting.  Genitourinary: Negative.   Musculoskeletal: Negative.   Skin: Negative.   Neurological: Negative.  Negative for tingling.   Blood pressure 133/77, pulse 83, temperature 97.8 F (36.6 C), temperature source Oral, resp. rate 16, height '5\' 7"'$  (1.702 m), weight 87.5 kg, SpO2 98 %. Body mass index is 30.23 kg/m.   Treatment Plan Summary: Daily contact with patient to assess and evaluate symptoms and progress in treatment and medication management  ASSESSMENT: Danny Cook. is a 62 y.o., male with a past psychiatric history significant for MDD with psychotic features who presents to the [facility] from [facility] Department for evaluation and management of SI with plan to overdose on medications.   PLAN: Safety and Monitoring:  -- Voluntary admission to inpatient psychiatric unit for safety, stabilization and treatment  -- Daily contact with patient to assess and evaluate symptoms and progress in treatment  -- Patient's case to be discussed in multi-disciplinary team meeting  -- Observation Level : q15 minute checks  -- Vital signs:  q12 hours  -- Precautions: suicide, elopement, and assault  2. Medications:    Psychiatric Diagnosis and Treatment MDD (major depressive disorder), recurrent severe, without psychosis  ([place])  Principal Problem:   MDD (major depressive disorder), recurrent severe, without psychosis ([place]) Active Problems:   Chronic diastolic heart failure (HCC)   HTN (hypertension)   Diabetes (HCC)   HIV positive (HCC)   Generalized anxiety disorder -Start Effexor-XR 75 mg daily with breakfast for depressive symptoms and anxiety -Start Remeron 30 mg qHS for insomnia, poor appetite and MDD (decreased from home dose 45 mg) -Start Geodon taper 20 mg BID with meals for 4 doses (home dose 40 mg BID) -Start Fanapt taper (home dose of 12 mg BID)  -8 mg q12H for 3 doses -> 6 mg q12H on 1/17 -> 4 mg q12H on 1/18 -> 2 mg q12H on 1/19 -Start Neurontin 600 mg q8H for neuropathy and anxiety (decreased from home dose of 800 mg TID) -Continue home Valium 5 mg q8H for anxiety  -Patient is open-minded towards benzo taper in the future -STOP home Cogentin, Arecept, and Namenda due to patient not taking/filling -STOP home Depakote due to polypharmacy  Medical Diagnosis and Treatment -PT/OT consult  HIV- continue home Triumeq  - ordered CD4 count and HIV quant  DM- continue home glimepiride, metformin, and semaglutide - CBG checks - SSI - diabetes coordinator consult  CVA history- continue home ASA Stomach cramping/Constipation- continue home dicyclomine, colace, and senna HLD- continue home gemfibrozil, and crestor HTN- continue home losartan GERD- protonix, carafate PRN BPH- continue home flomax Dry Eyes- continue home brimonidine    Other as needed medications  Tylenol 650 mg every 6 hours as needed for pain Mylanta 30 mL every 4 hours as needed for indigestion Milk of magnesia 30 mL daily as needed for constipation Meclizine 25 mg TID as needed for dizziness   The risks/benefits/side-effects/alternatives to the above medication were discussed in detail with the patient and time was given for questions. The patient consents to medication trial. FDA black box warnings, if present, were  discussed.  The patient is agreeable with the medication plan, as above. We will monitor the patient's response to pharmacologic treatment, and adjust medications as necessary.  3. Routine and other pertinent labs: EKG monitoring: QTc: 458  Metabolism / endocrine: BMI: Body mass index is 30.23 kg/m.  Pending CD4 and HIV quant  CBC: RBC 4.2, Hct 38.3, otherwise WNL CMP: Cr: 1.36>1.41 (baseline around 1.10) UA WNL UDS positive benzo Vit b12 294 Alcohol <10   Lipid panel in 04/15/2022: Triglycerides 259 VLDL 52  Valproic acid level in 04/14/2022: <10   4. Group Therapy:  -- Encouraged patient to participate in unit milieu and in scheduled group therapies   -- Short Term Goals: Ability to identify changes in lifestyle to reduce recurrence of condition will improve, Ability to verbalize feelings will improve, Ability to disclose and discuss suicidal ideas, Ability to demonstrate self-control will improve, Ability to identify and develop effective coping behaviors will improve, Ability to maintain clinical measurements within normal limits will improve, Compliance with prescribed medications will improve, and Ability to identify triggers associated with substance abuse/mental health issues will improve  -- Long Term Goals: Improvement in symptoms so as ready for discharge -- Patient is encouraged to participate in group therapy while admitted to the psychiatric unit. -- We will address other chronic and acute stressors, which contributed to the patient's MDD (major depressive disorder), recurrent severe, without psychosis ([place]) in order to reduce the risk of self-harm at discharge.  5. Discharge Planning:   -- Social work and case management to assist with discharge planning and identification of hospital follow-up needs prior to discharge  -- Estimated LOS: 5-7 days  -- Discharge Concerns: Need to establish a safety plan; Medication compliance and effectiveness  -- Discharge Goals:  Return home with outpatient referrals for mental health follow-up including medication management/psychotherapy  I certify that inpatient services furnished can reasonably be expected to improve the patient's condition.    I discussed my assessment, planned testing and intervention for the patient with Dr. Caswell Corwin who agrees with my formulated course of action.   Alesia Morin, MD, PGY-1 1/15/20244:07 PM

## 2022-07-15 LAB — CD4/CD8 (T-HELPER/T-SUPPRESSOR CELL)
CD4 absolute: 1102 /uL (ref 400–1790)
CD4%: 41.37 % (ref 33–65)
CD8 T Cell Abs: 798 /uL (ref 190–1000)
CD8tox: 29.94 % (ref 12–40)
Ratio: 1.38 (ref 1.0–3.0)
Total lymphocyte count: 2665 /uL (ref 1000–4000)

## 2022-07-15 LAB — GLUCOSE, CAPILLARY
Glucose-Capillary: 120 mg/dL — ABNORMAL HIGH (ref 70–99)
Glucose-Capillary: 55 mg/dL — ABNORMAL LOW (ref 70–99)
Glucose-Capillary: 71 mg/dL (ref 70–99)
Glucose-Capillary: 188 mg/dL — ABNORMAL HIGH (ref 70–99)
Glucose-Capillary: 188 mg/dL — ABNORMAL HIGH (ref 70–99)
Glucose-Capillary: 139 mg/dL — ABNORMAL HIGH (ref 70–99)

## 2022-07-15 NOTE — Group Note (Signed)
LCSW Group Therapy Note   Group Date: 07/15/2022 Start Time: 1100 End Time: 1200  Type of Therapy: Group Therapy: Boundaries  Description of Group: This group will address the use of boundaries in their personal lives. Patients will explore why boundaries are important, the difference between healthy and unhealthy boundaries, and negative and positive outcomes of different boundaries and will look at how boundaries can be crossed.  Patients will be encouraged to identify current boundaries in their own lives and identify what kind of boundary is being set. Facilitators will guide patients in utilizing problem-solving interventions to address and correct types of boundaries being used and to address when no boundary is being used. Understanding and applying boundaries will be explored and addressed for obtaining and maintaining a balanced life. Patients will be encouraged to explore ways to assertively make their boundaries and needs known to significant others in their lives, using other group members and facilitator for role play, support, and feedback.   Therapeutic Goals: 1. Patient will identify areas in their life where setting clear boundaries could be used to improve their life.  2. Patient will identify signs/triggers that a boundary is not being respected. 3. Patient will identify two ways to set boundaries in order to achieve balance in their lives: 4. Patient will demonstrate ability to communicate their needs and set boundaries through discussion and/or role plays   Participation Level: Did Not Attend  Summary of Progress/Problems: Did not attend  Darleen Crocker, LCSWA 07/15/2022  1:25 PM

## 2022-07-15 NOTE — Plan of Care (Signed)
  Problem: Education: Goal: Emotional status will improve Outcome: Progressing Goal: Mental status will improve Outcome: Progressing Goal: Verbalization of understanding the information provided will improve Outcome: Progressing   Problem: Safety: Goal: Periods of time without injury will increase Outcome: Progressing   

## 2022-07-15 NOTE — Progress Notes (Signed)
   07/15/22 0545  15 Minute Checks  Location Bedroom  Visual Appearance Calm  Behavior Sleeping  Sleep (Behavioral Health Patients Only)  Calculate sleep? (Click Yes once per 24 hr at 0600 safety check) Yes  Documented sleep last 24 hours 8.75

## 2022-07-15 NOTE — Progress Notes (Signed)
Hypoglycemic Event  CBG: 55 at 1145  Treatment: 4 oz juice/soda  Symptoms: Shaky and Hungry  Follow-up CBG: Time:1203 CBG Result:71  Possible Reasons for Event: Inadequate meal intake  Comments/MD notified: Staff will be notifying MD    Danny Cook

## 2022-07-15 NOTE — BHH Group Notes (Signed)
Spiritual care group on grief and loss facilitated by Chaplain Katy Avannah Decker, B[facility] and Colleen Pesci, counseling intern.  Group Goal: Support / Education around grief and loss  Members engage in facilitated group support and psycho-social education.  Group Description:  Following introductions and group rules, group members engaged in facilitated group dialogue and support around topic of loss, with particular support around experiences of loss in their lives. Group Identified types of loss (relationships / self / things) and identified patterns, circumstances, and changes that precipitate losses. Reflected on thoughts / feelings around loss, normalized grief responses, and recognized variety in grief experience. Group encouraged individual reflection on safe space and on the coping skills that they are already utilizing.  Group drew on Adlerian / Rogerian and narrative framework  Patient Progress: Did not attend.  

## 2022-07-15 NOTE — Progress Notes (Signed)
   07/15/22 0800  Psych Admission Type (Psych Patients Only)  Admission Status Voluntary  Psychosocial Assessment  Patient Complaints Anxiety  Eye Contact Fair  Facial Expression Anxious  Affect Apathetic  Speech Logical/coherent  Interaction Cautious  Motor Activity Slow  Appearance/Hygiene Unremarkable  Behavior Characteristics Cooperative;Calm;Anxious  Mood Anxious  Thought Process  Coherency WDL  Content WDL  Delusions None reported or observed  Perception WDL  Hallucination None reported or observed  Judgment Poor  Confusion None  Danger to Self  Current suicidal ideation? Denies  Self-Injurious Behavior No self-injurious ideation or behavior indicators observed or expressed   Agreement Not to Harm Self Yes  Description of Agreement Verbal  Danger to Others  Danger to Others None reported or observed

## 2022-07-15 NOTE — Group Note (Signed)
Recreation Therapy Group Note   Group Topic:Animal Assisted Therapy   Group Date: 07/15/2022 Start Time: 1430 End Time: 1510 Facilitators: Nicklaus Alviar-McCall, LRT,CTRS Location: [address] Dayroom   Animal-Assisted Activity (AAA) Program Checklist/Progress Notes Patient Eligibility Criteria Checklist & Daily Group note for Rec Tx Intervention  AAA/T Program Assumption of Risk Form signed by Patient/ or Parent Legal Guardian Yes  Patient understands his/her participation is voluntary Yes   Affect/Mood: N/A   Participation Level: Did not attend    Clinical Observations/Individualized Feedback:      Plan: Continue to engage patient in RT group sessions 2-3x/week.   Oluwademilade Kellett-McCall, LRT,CTRS 07/15/2022 4:21 PM

## 2022-07-15 NOTE — Plan of Care (Signed)
  Problem: Education: Goal: Knowledge of [facility] Education information/materials will improve Outcome: Progressing Goal: Emotional status will improve Outcome: Progressing Goal: Mental status will improve Outcome: Progressing Goal: Verbalization of understanding the information provided will improve Outcome: Progressing   Problem: Coping: Goal: Ability to verbalize frustrations and anger appropriately will improve Outcome: Progressing Goal: Ability to demonstrate self-control will improve Outcome: Progressing

## 2022-07-15 NOTE — Plan of Care (Signed)
Pt will perform LB dressing at EOB w/ MOD and AE prn at time of DC.   Pt will pass Pill Box Test prior to DC.

## 2022-07-15 NOTE — B[facility] Suicide Risk Assessment (Signed)
[place] INPATIENT:  Family/Significant Other Suicide Prevention Education  Suicide Prevention Education:  Education Completed; Jonna Coup (peer support) [phone]   ,  (name of family member/significant other) has been identified by the patient as the family member/significant other with whom the patient will be residing, and identified as the person(s) who will aid the patient in the event of a mental health crisis (suicidal ideations/suicide attempt).  With written consent from the patient, the family member/significant other has been provided the following suicide prevention education, prior to the and/or following the discharge of the patient.  [name] spoke with peer support that was unaware patient was doing bad.  He reports that he is suspicious if patient is saying he is suicidal in order to get additional services.  Peer support states that he is in a safe environment with lots of community support.  He has been living were he is at for about 10 months and states that if he wants to go to a group home we would need to set up a treatment team and contact his post transition coordinator, Thurnell Lose from [employer].Peer support was able to provide [place] her email and encouraged [name] to reach out.  Peer support has no safety concerns at this time.  He reports no access to guns or weapons.   The suicide prevention education provided includes the following: Suicide risk factors Suicide prevention and interventions National Suicide Hotline telephone number [facility] assessment telephone number [facility] Emergency Assistance [place] and/or Residential Mobile Crisis Unit telephone number  Request made of family/significant other to: Remove weapons (e.g., guns, rifles, knives), all items previously/currently identified as safety concern.   Remove drugs/medications (over-the-counter, prescriptions, illicit drugs), all items previously/currently identified as a safety  concern.  The family member/significant other verbalizes understanding of the suicide prevention education information provided.  The family member/significant other agrees to remove the items of safety concern listed above.  Afsana Liera E Scheryl Sanborn 07/15/2022, 1:20 PM
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
## 2022-07-15 NOTE — Progress Notes (Signed)
   07/14/22 2105  Psych Admission Type (Psych Patients Only)  Admission Status Voluntary  Psychosocial Assessment  Patient Complaints Anxiety  Eye Contact Fair  Facial Expression Anxious  Affect Apathetic  Speech Logical/coherent  Interaction Minimal  Motor Activity Slow  Appearance/Hygiene Unremarkable  Behavior Characteristics Anxious  Mood Anxious  Thought Process  Coherency WDL  Content WDL  Delusions None reported or observed  Perception WDL  Hallucination None reported or observed  Judgment Poor  Confusion None  Danger to Self  Current suicidal ideation? Denies  Self-Injurious Behavior No self-injurious ideation or behavior indicators observed or expressed   Agreement Not to Harm Self Yes  Description of Agreement Verbal  Danger to Others  Danger to Others None reported or observed

## 2022-07-15 NOTE — Evaluation (Signed)
Occupational Therapy Evaluation Patient Details Name: Danny Cook. MRN: [id number] DOB: January 15, 1961 Today's Date: 07/15/2022   History of Present Illness Pt is a 62 y.o., male with a past psychiatric history significant for MDD with psychotic features who presents to the [place] [facility] from  [facility] Emergency Department for evaluation and management of SI with plan to overdose on medications. OT was consulted for evaluation and treatment.   Clinical Impression   Pt is seen by OT for eval on this date. Pt presents w/ generalized weakness and mild imbalance. CC is a gradual decline in ADLs and iADL performance at home to the extent he believes he is no longer capable of independent living. Specifically he states tasks such as cooking and LE ADLs are difficult and are significantly in decline / reports difficulty in day to day Rx mgmt, specifically his insulin shots in addition to medications that arrive in blister "pill packs". Says he often forgets and is sometimes confused by the process.   OT briefly assess cognition w/ CDT and delayed recall of 5 words where his CDT effort is errorless overall, recalls one word w/o assist, 1 word w/ semantic cues and 3 words w/ multiple choice. 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   OT will follow pt in this setting to address the deficits in these areas and will cont to update POC and make DC recommendations as the full extent of CI is understood w/ MoCA testing and additional psychometric testing as required.      Recommendations for follow up therapy are one component of a multi-disciplinary discharge planning process, led by the attending physician.  Recommendations may be updated based on patient status, additional functional criteria and insurance authorization.   Follow Up Recommendations  Skilled nursing-short term rehab (<3 hours/day)     Assistance Recommended at Discharge  Intermittent Supervision/Assistance  Patient can return home with the following Assistance with cooking/housework;Direct supervision/assist for financial management;Direct supervision/assist for medications management    Functional Status Assessment  Patient has had a recent decline in their functional status and demonstrates the ability to make significant improvements in function in a reasonable and predictable amount of time.  Equipment Recommendations  Other (comment) (DME needs met at home per pt report)           Precautions / Restrictions Precautions Precautions: Other (comment) (Hx of SI) Restrictions Weight Bearing Restrictions: No      Mobility Bed Mobility Overal bed mobility: Independent             General bed mobility comments: independent but additional time required                              Balance Overall balance assessment: Needs assistance (requires HHA d/t mild imbalance / normally uses a 4WRW at home, occasionally a SPC)         Standing balance support: Single extremity supported                               ADL either performed or assessed with clinical judgement   ADL Overall ADL's : Modified independent  General ADL Comments: OT notes some mild imbalance and generalized weakness that limits ADLs that challenge functional mobility and dynamic balance. Pt specifically cites cooking and LE dressing as a major pain point.      Pertinent Vitals/Pain Pain Assessment Pain Assessment: 0-10 Pain Score: 5  Pain Location: lower back pain     Hand Dominance Right   Extremity/Trunk Assessment             Communication Communication Communication: No difficulties   Cognition Arousal/Alertness: Awake/alert Behavior During Therapy: Anxious Overall Cognitive Status: History of cognitive impairments - at baseline                                  General Comments: performs clock drawing test w/o error in construction and contour, digit placement and time setting to 10 past 11. Processing speed is normal and there's no evidence of micrographia or rigidity in the line quality. pt scores 7/15 for Memory Index Score suggesting at least moderate impairment for delayed recall. OT plans to administer complete MoCA during next treatment visit.                      Home Living Family/patient expects to be discharged to:: Private residence Living Arrangements: Alone Available Help at Discharge: Family;Other (Comment);Available PRN/intermittently Type of Home: Apartment Home Access: Level entry     Home Layout: One level     Bathroom Shower/Tub: Teacher, early years/pre: Standard     Home Equipment: Rollator (4 wheels);Shower seat;Grab bars - tub/shower;Grab bars - toilet;Rolling Walker (2 wheels)          Prior Functioning/Environment Prior Level of Function : Needs assist  Cognitive Assist : ADLs (cognitive)   ADLs (Cognitive): Set up cues Physical Assist : ADLs (physical)   ADLs (physical):  (ADLs are limited d/t imbalance and chronic pain per pt report) Mobility Comments: 4wRW and SPC ADLs Comments: Mod I in dressing, bathing, bed mobility. Does not drive, receives rides to grocery store. Reports he had difficulties performing ADLs in standing, cooking, remembering to take medications and with basic housekeeping. Pt beleives he is is no longer able to care for himself in an independent living situation.        OT Problem List: Decreased strength;Decreased cognition;Impaired balance (sitting and/or standing);Decreased activity tolerance      OT Treatment/Interventions: Cognitive remediation/compensation;Self-care/ADL training;Therapeutic exercise;Patient/family education;Therapeutic activities    OT Goals(Current goals can be found in the care plan section) Acute Rehab OT Goals Patient Stated Goal:  improve balance, Rx mgmt and general strengthening OT Goal Formulation: With patient Time For Goal Achievement: 08/05/22 Potential to Achieve Goals: Fair  OT Frequency: Min 2X/week    AM-PAC OT "6 Clicks" Daily Activity     Outcome Measure Help from another person eating meals?: None Help from another person taking care of personal grooming?: None Help from another person toileting, which includes using toliet, bedpan, or urinal?: None Help from another person bathing (including washing, rinsing, drying)?: A Little Help from another person to put on and taking off regular upper body clothing?: None Help from another person to put on and taking off regular lower body clothing?: A Little 6 Click Score: 22   End of Session Nurse Communication: Other (comment) (OT POC)  Activity Tolerance: Patient tolerated treatment well Patient left: in bed  OT Visit Diagnosis: Cognitive communication deficit (R41.841)  Time: 9449-6759 OT Time Calculation (min): 32 min Charges:  OT General Charges $OT Visit: 1 Visit OT Evaluation $OT Eval Low Complexity: 1 Low OT Treatments $Self Care/Home Management : 8-22 mins  Cornell Barman, OT   Brantley Stage 07/15/2022, 6:47 PM

## 2022-07-15 NOTE — Group Note (Signed)
Date:  07/15/2022 Time:  2:52 PM  Group Topic/Focus:  Orientation:   The focus of this group is to educate the patient on the purpose and policies of crisis stabilization and provide a format to answer questions about their admission.  The group details unit policies and expectations of patients while admitted.    Participation Level:  Minimal  Participation Quality:  Attentive  Affect:  Appropriate  Cognitive:  Appropriate  Insight: Appropriate  Engagement in Group:  Limited  Modes of Intervention:  Discussion  Additional Comments:     Jerrye Beavers 07/15/2022, 2:52 PM

## 2022-07-15 NOTE — Progress Notes (Signed)
The patient rated his day as either a 6 or 7 out of 10. His positive event for the day is that he talked more than yesterday. He admits to being very shy in social situations.

## 2022-07-16 LAB — GLUCOSE, CAPILLARY
Glucose-Capillary: 136 mg/dL — ABNORMAL HIGH (ref 70–99)
Glucose-Capillary: 97 mg/dL (ref 70–99)
Glucose-Capillary: 166 mg/dL — ABNORMAL HIGH (ref 70–99)

## 2022-07-16 LAB — HIV-1 RNA QUANT-NO REFLEX-BLD
HIV 1 RNA Quant: 30 copies/mL
LOG10 HIV-1 RNA: 1.477 log10copy/mL
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
## 2022-07-16 NOTE — Plan of Care (Signed)
  Problem: Activity: ?Goal: Interest or engagement in activities will improve ?Outcome: Progressing ?Goal: Sleeping patterns will improve ?Outcome: Progressing ?  ?Problem: Coping: ?Goal: Ability to verbalize frustrations and anger appropriately will improve ?Outcome: Progressing ?Goal: Ability to demonstrate self-control will improve ?Outcome: Progressing ?  ?

## 2022-07-16 NOTE — Group Note (Signed)
Occupational Therapy Group Note  Group Topic:Coping Skills  Group Date: 07/14/2022 Start Time: 1400 End Time: 1440 Facilitators: Brantley Stage, OT   Group Description: Group encouraged increased engagement and participation through discussion and activity focused on "Coping Ahead." Patients were split up into teams and selected a card from a stack of positive coping strategies. Patients were instructed to act out/charade the coping skill for other peers to guess and receive points for their team. Discussion followed with a focus on identifying additional positive coping strategies and patients shared how they were going to cope ahead over the weekend while continuing hospitalization stay.  Therapeutic Goal(s): Identify positive vs negative coping strategies. Identify coping skills to be used during hospitalization vs coping skills outside of hospital/at home Increase participation in therapeutic group environment and promote engagement in treatment   Participation Level: Active and Engaged   Participation Quality: Independent   Behavior: Appropriate   Speech/Thought Process: Coherent   Affect/Mood: Appropriate   Insight: Fair   Judgement: Fair   Individualization: pt was engaged in their participation of group discussion/activity. New skills identified  Modes of Intervention: Discussion  Patient Response to Interventions:  Attentive   Plan: Continue to engage patient in OT groups 2 - 3x/week.  07/16/2022  Brantley Stage, OT Cornell Barman, OT

## 2022-07-16 NOTE — Group Note (Signed)
Recreation Therapy Group Note   Group Topic:Problem Solving  Group Date: 07/16/2022 Start Time: 0935 End Time: 0952 Facilitators: Sheniece Ruggles-McCall, LRT,CTRS Location: [address] Dayroom   Goal Area(s) Addresses:  Patient will effectively work with peer towards shared goal.  Patient will identify skills used to make activity successful.  Patient will share challenges and verbalize solution-driven approaches used. Patient will identify how skills used during activity can be used to reach post d/c goals.   Group Description: [employer]. Patients were provided the following materials: 5 drinking straws, 5 rubber bands, 5 paper clips, 2 index cards, and 2 drinking cups. Using the provided materials patients were asked to build a launching mechanism to launch a ping pong ball across the room, approximately 10 feet. Patients were divided into teams of 3-5. Instructions required all materials be incorporated into the device, functionality of items left to the peer group's discretion.   Affect/Mood: Appropriate   Participation Level: Minimal   Participation Quality: Independent   Behavior: Appropriate   Speech/Thought Process: Focused   Insight: Fair   Judgement: Fair    Modes of Intervention: STEM Activity   Patient Response to Interventions:  Attentive   Education Outcome:  Acknowledges education and In group clarification offered    Clinical Observations/Individualized Feedback: Pt was quiet and observant during group session.      Plan: Continue to engage patient in RT group sessions 2-3x/week.   Acelynn Dejonge-McCall, LRT,CTRS 07/16/2022 1:41 PM

## 2022-07-16 NOTE — Progress Notes (Signed)
   07/16/22 0800  Psych Admission Type (Psych Patients Only)  Admission Status Voluntary  Psychosocial Assessment  Patient Complaints Anxiety  Eye Contact Fair  Facial Expression Animated  Affect Appropriate to circumstance  Speech Logical/coherent  Interaction Assertive  Motor Activity Slow  Appearance/Hygiene Unremarkable  Behavior Characteristics Cooperative;Unable to participate  Mood Pleasant  Thought Process  Coherency WDL  Content WDL  Delusions None reported or observed  Perception WDL  Hallucination None reported or observed  Judgment Poor  Confusion None  Danger to Self  Current suicidal ideation? Denies  Self-Injurious Behavior No self-injurious ideation or behavior indicators observed or expressed   Agreement Not to Harm Self Yes  Description of Agreement verbal  Danger to Others  Danger to Others None reported or observed

## 2022-07-16 NOTE — Progress Notes (Signed)
The patient did not attend group this evening.

## 2022-07-16 NOTE — BHH Counselor (Signed)
BHH/BMU LCSW Progress Note   07/16/2022    9:41 AM  [name].   [id number]   Type of Contact and Topic:  Careteam Collateral  [name] spoke with patient regarding setting up a tx team meeting with community supports re: living situation.  Patient states "I have a change of heart" and patient no longer want to go to a group home and states that he realizes his housing may not be making him depressed. He is willing to continue with treatment meeting but states that he feels like a day program may be more appropriate.   [name] emailed community supports to let them know about this and requested if tx team meeting still needed could be scheduled at 11am on Thursday.    Signed:  Riki Altes MSW, LCSW, LCAS 07/16/2022 9:41 AM

## 2022-07-16 NOTE — Progress Notes (Signed)
   07/15/22 2200  Psych Admission Type (Psych Patients Only)  Admission Status Voluntary  Psychosocial Assessment  Patient Complaints Anxiety  Eye Contact Fair  Facial Expression Animated  Affect Appropriate to circumstance  Speech Logical/coherent  Interaction Assertive  Motor Activity Slow  Appearance/Hygiene Unremarkable  Behavior Characteristics Cooperative;Appropriate to situation;Calm  Mood Pleasant  Thought Process  Coherency WDL  Content WDL  Delusions None reported or observed  Perception WDL  Hallucination None reported or observed  Judgment Poor  Confusion None  Danger to Self  Current suicidal ideation? Denies  Self-Injurious Behavior No self-injurious ideation or behavior indicators observed or expressed   Agreement Not to Harm Self Yes  Description of Agreement verbal  Danger to Others  Danger to Others None reported or observed

## 2022-07-16 NOTE — Evaluation (Signed)
Physical Therapy Evaluation Patient Details Name: Danny Cook. 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 OT was consulted for evaluation and treatment.   Clinical Impression  Danny Cook. is 62 y.o. male admitted with above HPI and diagnosis. Patient is currently limited by functional impairments below (see PT problem list). Patient lives alone and is uses Rollator for mobility at baseline. He has personal care aids/peer support aids who assist with transportation and IADL's as needed. He is unsteady with mobility and is a high fall risk based on DGI. Overall he is able to mobilize with no AD and supervision to ambulate short household distances. Pt reports RW improves back pain however posture worse with RW as pt has tendency to flex trunk forward. Patient no further acute PT needs identified. All education has been completed and the patient has no further questions. Recommending HHPT follow up when safe for discharge home. PT is signing off. Thank you for this referral.      Recommendations for follow up therapy are one component of a multi-disciplinary discharge planning process, led by the attending physician.  Recommendations may be updated based on patient status, additional functional criteria and insurance authorization.  Follow Up Recommendations Home health PT      Assistance Recommended at Discharge Intermittent Supervision/Assistance  Patient can return home with the following  A little help with walking and/or transfers;A little help with bathing/dressing/bathroom;Assist for transportation;Help with stairs or ramp for entrance;Assistance with  cooking/housework;Direct supervision/assist for medications management    Equipment Recommendations None recommended by PT  Recommendations for Other Services       Functional Status Assessment Patient has had a recent decline in their functional status and demonstrates the ability to make significant improvements in function in a reasonable and predictable amount of time.     Precautions / Restrictions Precautions Precautions: Other (comment) (Hx of SI) Restrictions Weight Bearing Restrictions: No      Mobility  Bed Mobility                    Transfers Overall transfer level: Needs assistance Equipment used: Rolling walker (2 wheels), None Transfers: Sit to/from Stand Sit to Stand: Modified independent (Device/Increase time)           General transfer comment: no assist needed for sit<>stand and    Ambulation/Gait Ambulation/Gait assistance: Min guard, Supervision Gait Distance (Feet): 300 Feet Assistive device: Rolling walker (2 wheels), None Gait Pattern/deviations: Step-through pattern, Decreased stride length (Rt lean) Gait velocity: decr        Stairs            Wheelchair Mobility    Modified Rankin (Stroke Patients Only)       Balance Overall balance assessment: Needs assistance Sitting-balance support: Feet supported Sitting balance-Leahy Scale: Good     Standing balance support: Bilateral upper extremity supported, No upper extremity supported, During functional activity Standing balance-Leahy Scale: Fair                   Standardized Balance Assessment Standardized Balance Assessment : Dynamic Gait Index   Dynamic Gait Index Level Surface: Mild Impairment Change in Gait Speed: Mild Impairment Gait with Horizontal Head Turns:  Mild Impairment Gait with Vertical Head Turns: Mild Impairment Gait and Pivot Turn: Mild Impairment Step Over Obstacle: Moderate Impairment Step Around Obstacles: Mild Impairment Steps:  Moderate Impairment Total Score: 14       Pertinent Vitals/Pain      Home Living Family/patient expects to be discharged to:: Private residence Living Arrangements: Alone Available Help at Discharge: Family;Other (Comment);Available PRN/intermittently Type of Home: Apartment Home Access: Level entry       Home Layout: One level Home Equipment: Rollator (4 wheels);Shower seat;Grab bars - tub/shower;Grab bars - toilet;Rolling Walker (2 wheels)      Prior Function Prior Level of Function : Needs assist  Cognitive Assist : ADLs (cognitive)   ADLs (Cognitive): Set up cues Physical Assist : ADLs (physical)   ADLs (physical):  (ADLs are limited d/t imbalance and chronic pain per pt report) Mobility Comments: 4wRW and SPC ADLs Comments: Mod I in dressing, bathing, bed mobility. Does not drive, receives rides to grocery store. Reports he had difficulties performing ADLs in standing, cooking, remembering to take medications and with basic housekeeping. Pt beleives he is is no longer able to care for himself in an independent living situation.     Hand Dominance   Dominant Hand: Right    Extremity/Trunk Assessment   Upper Extremity Assessment Upper Extremity Assessment: Overall WFL for tasks assessed    Lower Extremity Assessment Lower Extremity Assessment: Overall WFL for tasks assessed    Cervical / Trunk Assessment Cervical / Trunk Assessment: Kyphotic  Communication   Communication: No difficulties  Cognition Arousal/Alertness: Awake/alert Behavior During Therapy: Anxious, WFL for tasks assessed/performed Overall Cognitive Status: History of cognitive impairments - at baseline                                          General Comments      Exercises     Assessment/Plan    PT Assessment All further PT needs can be met in the next venue of care  PT Problem List Decreased strength;Decreased activity tolerance;Decreased balance;Decreased  mobility;Decreased knowledge of use of DME;Decreased safety awareness;Decreased knowledge of precautions       PT Treatment Interventions DME instruction;Gait training;Functional mobility training;Therapeutic activities;Therapeutic exercise;Balance training;Neuromuscular re-education;Patient/family education    PT Goals (Current goals can be found in the Care Plan section)  Acute Rehab PT Goals Patient Stated Goal: get back home when ready and have [place] services PT Goal Formulation: With patient Time For Goal Achievement: 07/30/22 Potential to Achieve Goals: Good    Frequency  (1x eval)     Co-evaluation               AM-PAC PT "6 Clicks" Mobility  Outcome Measure Help needed turning from your back to your side while in a flat bed without using bedrails?: None Help needed moving from lying on your back to sitting on the side of a flat bed without using bedrails?: None Help needed moving to and from a bed to a chair (including a wheelchair)?: A Little Help needed standing up from a chair using your arms (e.g., wheelchair or bedside chair)?: A Little Help needed to walk in hospital room?: A Little Help needed climbing 3-5 steps with a railing? : A Little 6 Click Score: 20    End of Session Equipment Utilized During Treatment: Gait belt Activity Tolerance: Patient tolerated treatment well Patient left: in chair (in front of  nursing station) Nurse Communication: Mobility status PT Visit Diagnosis: Muscle weakness (generalized) (M62.81);Difficulty in walking, not elsewhere classified (R26.2);Other abnormalities of gait and mobility (R26.89);Unsteadiness on feet (R26.81)    Time: 8616-8372 PT Time Calculation (min) (ACUTE ONLY): 15 min   Charges:   PT Evaluation $PT Eval Moderate Complexity: 1 Mod          Verner Mould, DPT Acute Rehabilitation Services Office [phone]  07/16/22 3:28 PM

## 2022-07-17 LAB — GLUCOSE, CAPILLARY
Glucose-Capillary: 240 mg/dL — ABNORMAL HIGH (ref 70–99)
Glucose-Capillary: 61 mg/dL — ABNORMAL LOW (ref 70–99)
Glucose-Capillary: 82 mg/dL (ref 70–99)
Glucose-Capillary: 178 mg/dL — ABNORMAL HIGH (ref 70–99)
Glucose-Capillary: 137 mg/dL — ABNORMAL HIGH (ref 70–99)

## 2022-07-17 MED ORDER — ILOPERIDONE 4 MG PO TABS
4.0000 mg | ORAL_TABLET | Freq: Two times a day (BID) | ORAL | Status: DC
  Administered 2022-07-17 – 2022-07-22 (×10): 4 mg via ORAL
  Filled 2022-07-17 (×14): qty 1

## 2022-07-17 MED ORDER — GLIMEPIRIDE 1 MG PO TABS
1.0000 mg | ORAL_TABLET | Freq: Every day | ORAL | Status: DC
  Administered 2022-07-18 – 2022-07-22 (×5): 1 mg via ORAL
  Filled 2022-07-17 (×8): qty 1

## 2022-07-17 NOTE — BHH Counselor (Signed)
BHH/BMU LCSW Progress Note   07/17/2022    10:15 AM  Danny Cook   [id number]   Type of Contact and Topic:  Tx Team Meeting with [place] assisted patient with participating in treatment team meeting.  Pt discussed that he wanted to stay in his apartment.  Plan of action is this [name] will do a referral to [facility] for patient.  Team will follow up on the referral and continue to assist patient.  [name] let team know that patient has an expected discharge of next Tuesday.    Signed:  Riki Altes MSW, LCSW, LCAS 07/17/2022 10:15 AM

## 2022-07-17 NOTE — Progress Notes (Signed)
Occupational Therapy Treatment Patient Details Name: Danny Cook. 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 OT was consulted for evaluation and treatment.   OT comments  Pt was seen today for follow up treatment where pt completed the MoCA v 8.2 where his score is recorded as 27/30, MIS of 14/15, Clock drawing Test of 10/10 and TMT B: WNL and w/o error in shifting w/ normal processing speed in initiation and shifting. Pt is independent in his functional mobility transfers during sit/Std and ambulation in hallway. LE dressing has also improved w/ independence / Pt was asked today his thoughts regarding his ability to care for himself at home and pt reports he has "had a change of heart" / pt goes on to provide insight into this statement that he is now feeling better in almost every way and believes it is as a result of proper nutrition and Rx mgmt. OT and pt explored modification and adaptive approaches he could take at home to eliminate his "forgetting" his inhaler and insulin shots. Pt agrees w/ OT to continue using blister style pill packs, will use posted visual reminders in his bathroom on the mirror as he access the BR first in the morning and will incorporate Rx mgmt into his existing AM routine for increased adherence using prompting. OT believes pt will continue to improve as long as he can incorporate these adaptive strategies into his at home daily routine s/p Dc from this [facility] acute setting.    Recommendations for follow up therapy are one component of a multi-disciplinary discharge planning process, led by the attending physician.  Recommendations may be updated based on patient  status, additional functional criteria and insurance authorization.    Follow Up Recommendations  Home health OT     Assistance Recommended at Discharge PRN (in the context of [place], PT and RN during transition back home.)  Patient can return home with the following  Other (comment) (2-3x/wk checks for Rx mgmt / compliance)   Equipment Recommendations    Needs met at home   Recommendations for Other Services  Would benefit from [facility] services     Precautions / Restrictions Restrictions Weight Bearing Restrictions: No       Mobility Bed Mobility Overal bed mobility: Independent Bed Mobility: Rolling, Sidelying to Sit, Supine to Sit, Sit to Supine, Sit to Sidelying           General bed mobility comments: independent w/ improvemetns in the quality of movements and safe, increased speed in the initiation and procesing of gross motor movements    Transfers Overall transfer level: Independent Equipment used: None Transfers: Sit to/from Stand Sit to Stand: Independent           General transfer comment: no assist needed for sit<>stand and     Balance Overall balance assessment: Modified Independent (ambulates in hallway w/ one hand on rail / no evidence of LOB or sway)                                         ADL either performed or assessed with clinical judgement   ADL  Overall ADL's : Independent                                       General ADL Comments: demonstrates improvements globally in ADLs w/o evidence of imbalance on this date. reaching to LEs has improved as have standing tasks that had previously challenged dynamic reaching balance and reaching in the act of crossing midline. it appears he is at his normal baseine of function at this time    Extremity/Trunk Assessment                                Cognition Arousal/Alertness: Awake/alert Behavior During Therapy: WFL for tasks assessed/performed Overall  Cognitive Status: Within Functional Limits for tasks assessed                                 General Comments: MoCA v 8.2: 27/30; Memory Index Score (MIS): 14/15; CDT: 10/10; TMT B: errorless . Global cognition is [facility] per MoCA assessment. Pt has improved significantly since initial encounter on 1/16.                           Pertinent Vitals/ Pain       Pain Assessment Pain Assessment: No/denies pain Pain Score: 0-No pain                                                          Frequency  Min 2X/week / goals have now been met and OT will plan to sign off at this time.        Progress Toward Goals  OT Goals(current goals can now be found in the care plan section)  Progress towards OT goals: Goals met/education completed, patient discharged from OT  Acute Rehab OT Goals Patient Stated Goal: pt has met OT goals at this time and is looking forward to DC to home early next week. may continue to benefit from mobility / strengthening OT Goal Formulation: With patient Time For Goal Achievement: 07/17/22 Potential to Achieve Goals: Good  Plan   Discontinue acute OT at this time                    AM-PAC OT "6 Clicks" Daily Activity     Outcome Measure   Help from another person eating meals?: None Help from another person taking care of personal grooming?: None Help from another person toileting, which includes using toliet, bedpan, or urinal?: None Help from another person bathing (including washing, rinsing, drying)?: None Help from another person to put on and taking off regular upper body clothing?: None Help from another person to put on and taking off regular lower body clothing?: None 6 Click Score: 24    End of Session    OT Visit Diagnosis: Cognitive communication deficit (R41.841)   Activity Tolerance Patient tolerated treatment well   Patient Left in bed   Nurse Communication Other (comment) (RN  notified of pts improvemtns in both cognitive capacity and skills as well as mobility and ADL performance.)  Time: 1450-1520 OT Time Calculation (min): 30 min  Charges: OT General Charges $OT Visit: 1 Visit OT Treatments $Therapeutic Activity: 8-22 mins $Cognitive Funtion inital: Initial 15 mins  Cornell Barman, OT   Brantley Stage 07/17/2022, 9:33 PM

## 2022-07-17 NOTE — Progress Notes (Signed)
   07/17/22 1700  Psych Admission Type (Psych Patients Only)  Admission Status Voluntary  Psychosocial Assessment  Patient Complaints Anxiety  Eye Contact Fair  Facial Expression Animated  Affect Appropriate to circumstance  Speech Logical/coherent  Interaction Assertive  Motor Activity Slow  Appearance/Hygiene Unremarkable  Behavior Characteristics Calm;Cooperative  Mood Pleasant  Aggressive Behavior  Effect No apparent injury  Thought Process  Coherency WDL  Content WDL  Delusions None reported or observed  Perception WDL  Hallucination None reported or observed  Judgment Poor  Confusion None  Danger to Self  Current suicidal ideation? Denies  Self-Injurious Behavior No self-injurious ideation or behavior indicators observed or expressed   Agreement Not to Harm Self Yes  Description of Agreement verbal contract for safety  Danger to Others  Danger to Others None reported or observed

## 2022-07-17 NOTE — Progress Notes (Signed)
Hypoglycemic Event  CBG: 61  Treatment: 8 oz juice/soda  Symptoms: None  Follow-up CBG: Time:1210 CBG Result:82  Possible Reasons for Event: Unknown      Danny Cook

## 2022-07-17 NOTE — Plan of Care (Signed)
Pt has met both LE dressing goal and has demonstrated normal problem solving and cognitive capacity w/ MoCA 27/30 suggesting normal cognitive function where a score > or equal to 26/30 is normal. MIS is recorded as 14/15 suggesting normal STM function
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
## 2022-07-17 NOTE — Group Note (Signed)
LCSW Group Therapy Note   Group Date: 07/17/2022 Start Time: 1100 End Time: 1200  Type of Therapy and Topic:  Group Therapy:  Setting Goals   Participation Level:  Active   Description of Group: In this process group, patients discussed using strengths to work toward goals and address challenges.  Patients identified one goal they are working on. Patients were given the opportunity to share openly and support each other's plan for self-empowerment.  The group discussed the acronym SMART to help individuals develop and work through their established goals. Patients were encouraged to identify a plan to utilize their strengths to work on current challenges and goals.   Therapeutic Goals Patient will verbalize personal strengths/positive qualities and relate how these can assist with achieving desired personal goals. Patients will verbalize affirmation of peers plans for personal change and goal setting. Patients will use the acronym SMART to help develop a plan for their goals. Patients will verbalize a plan for regular reinforcement of personal positive qualities and circumstances.   Summary of Patient Progress: Patient identified the definition of goals. Patient was given the opportunity to share openly and support other group members' plan for self-empowerment. Patient verbalized personal strength and how they relate to achieving the desired goal. Patient was able to identify positive goals to work towards when they return home.      Therapeutic Modalities Cognitive Behavioral Therapy Motivational Interviewing  Darleen Crocker, [place] 07/17/2022  1:23 PM

## 2022-07-17 NOTE — [facility] Group Notes (Signed)
Pt. did attend wrap up group.  Pt. was cooperative.

## 2022-07-17 NOTE — Inpatient Diabetes Management (Signed)
Inpatient Diabetes Program Recommendations  AACE/ADA: New Consensus Statement on Inpatient Glycemic Control  Target Ranges:  Prepandial:   less than 140 mg/dL      Peak postprandial:   less than 180 mg/dL (1-2 hours)      Critically ill patients:  140 - 180 mg/dL    Latest Reference Range & Units 07/17/22 08:12 07/17/22 11:49  Glucose-Capillary 70 - 99 mg/dL 240 (H) 61 (L)    Latest Reference Range & Units 07/16/22 06:31 07/16/22 11:56 07/16/22 17:16  Glucose-Capillary 70 - 99 mg/dL 136 (H) 97 166 (H)   Review of Glycemic Control  Diabetes history: DM2 Outpatient Diabetes medications: Amaryl 4 mg daily, Tresiba 34 units daily, Metformin 1000 mg BID, Ozempic 2 mg Qweek Current orders for Inpatient glycemic control: Amaryl 4 mg QAM, Metformin 1000 mg BID, Ozempic 2 mg Qweek, Novolog 0-15 units TID with meals  Inpatient Diabetes Program Recommendations:    Oral DM medication: Please decrease Amaryl to 1 mg daily. If patient has any other issues with hypoglycemia, may need to discontinue Amaryl altogether.   NOTE: Per chart patient sees Dr. Honor Junes (Endocrinologist) and was last seen 12/26/21. Per office note on 12/26/21, patient was told to staff of Amaryl (had stopped in May 2023) and decrease Tresiba to 34 units daily (if any lows decrease Tresiba to 30 units) and continue Ozempic. Several telephone notes with Endocrinology since last visit. Per telephone note on 06/24/22, patient had stopped [place] and was calling for clear direction on when to take [place]; patient was instructed to increase [place] to 40 units daily.   As inpatient,  patient has been receiving Amaryl 4 mg daily, Metformin 1000 mg BID, Ozempic 2 mg Qweek, and Novolog correction scale. Noted glucose down to 61 mg/dl today (after getting Amaryl and Metformin this morning; no Novolog correction given this morning for CBG of 240 mg/dl).   Thanks, Barnie Alderman, RN, MSN, [facility] Coordinator Inpatient Diabetes  Program [phone] (Team Pager from 8am to [place])

## 2022-07-18 ENCOUNTER — Encounter (HOSPITAL_COMMUNITY): Payer: Self-pay

## 2022-07-18 LAB — GLUCOSE, CAPILLARY
Glucose-Capillary: 163 mg/dL — ABNORMAL HIGH (ref 70–99)
Glucose-Capillary: 89 mg/dL (ref 70–99)
Glucose-Capillary: 168 mg/dL — ABNORMAL HIGH (ref 70–99)
Glucose-Capillary: 104 mg/dL — ABNORMAL HIGH (ref 70–99)

## 2022-07-18 MED ORDER — DIAZEPAM 2 MG PO TABS
4.0000 mg | ORAL_TABLET | Freq: Three times a day (TID) | ORAL | Status: AC
  Administered 2022-07-18 – 2022-07-19 (×4): 4 mg via ORAL
  Filled 2022-07-18 (×4): qty 2

## 2022-07-18 MED ORDER — DIAZEPAM 5 MG PO TABS
2.5000 mg | ORAL_TABLET | Freq: Three times a day (TID) | ORAL | Status: AC
  Administered 2022-07-20 (×3): 2.5 mg via ORAL
  Filled 2022-07-18 (×3): qty 1

## 2022-07-18 MED ORDER — DIAZEPAM 2 MG PO TABS
2.0000 mg | ORAL_TABLET | Freq: Three times a day (TID) | ORAL | Status: DC
  Administered 2022-07-21 – 2022-07-22 (×4): 2 mg via ORAL
  Filled 2022-07-18 (×4): qty 1

## 2022-07-18 NOTE — [facility] Group Notes (Signed)
Pt attended [place] meeting.

## 2022-07-18 NOTE — Group Note (Signed)
Date:  07/18/2022 Time:  10:41 AM  Group Topic/Focus:  Orientation:   The focus of this group is to educate the patient on the purpose and policies of crisis stabilization and provide a format to answer questions about their admission.  The group details unit policies and expectations of patients while admitted.    Participation Level:  Active  Participation Quality:  Appropriate  Affect:  Appropriate  Cognitive:  Appropriate  Insight: Good  Engagement in Group:  Engaged  Modes of Intervention:  Discussion  Additional Comments:     Jerrye Beavers 07/18/2022, 10:41 AM

## 2022-07-18 NOTE — Group Note (Signed)
Recreation Therapy Group Note   Group Topic:Stress Management  Group Date: 07/18/2022 Start Time: 0945 End Time: 1003 Facilitators: Graden Hoshino-McCall, LRT,CTRS Location: [address] Dayroom   Goal Area(s) Addresses:  Patient will actively participate in stress management techniques presented during session.  Patient will successfully identify benefit of practicing stress management post d/c.    Group Description: Guided Imagery. LRT provided education, instruction, and demonstration on practice of visualization via guided imagery. Patient was asked to participate in the technique introduced during session. LRT read a script that focused on visualizing a peaceful place that helps them relax, decompress and get away mentally.   LRT debriefed including topics of mindfulness, stress management and specific scenarios each patient could use these techniques. Patients were given suggestions of ways to access scripts post d/c and encouraged to explore Youtube and other apps available on smartphones, tablets, and computers.   Affect/Mood: N/A   Participation Level: Did not attend    Clinical Observations/Individualized Feedback:     Plan: Continue to engage patient in RT group sessions 2-3x/week.   Ione Sandusky-McCall, LRT,CTRS 07/18/2022 11:49 AM

## 2022-07-18 NOTE — Progress Notes (Signed)
   07/18/22 2000  Psych Admission Type (Psych Patients Only)  Admission Status Voluntary  Psychosocial Assessment  Patient Complaints Anxiety  Eye Contact Fair  Facial Expression Anxious  Affect Appropriate to circumstance  Speech Logical/coherent  Interaction Assertive  Motor Activity Slow  Appearance/Hygiene Unremarkable  Behavior Characteristics Cooperative  Mood Pleasant  Thought Process  Coherency WDL  Content WDL  Delusions None reported or observed  Perception WDL  Hallucination None reported or observed  Judgment Poor  Confusion None  Danger to Self  Current suicidal ideation? Denies  Self-Injurious Behavior No self-injurious ideation or behavior indicators observed or expressed   Agreement Not to Harm Self Yes  Description of Agreement verbal  Danger to Others  Danger to Others None reported or observed   Alert/oriented.  Makes needs/concerns known to staff. Pleasant cooperative with staff. Denies SI/HI/A/V hallucinations. Med compliant. PRN med given with good effect. Patient states went to group. Will encourage continue compliance and progression towards goals. Verbally contracted for safety. Will continue to monitor.

## 2022-07-18 NOTE — BH IP Treatment Plan (Signed)
Interdisciplinary Treatment and Diagnostic Plan Update  07/18/2022 Time of Session: [address] [place]. MRN: [id number]  Principal Diagnosis: Severe episode of recurrent major depressive disorder, without psychotic features ([place])  Secondary Diagnoses: Principal Problem:   Severe episode of recurrent major depressive disorder, without psychotic features ([place]) Active Problems:   Chronic diastolic heart failure ([place])   HTN (hypertension)   Diabetes ([place])   HIV positive ([place])   Generalized anxiety disorder   Current Medications:  Current Facility-Administered Medications  Medication Dose Route Frequency Provider Last Rate Last Admin   abacavir-dolutegravir-lamiVUDine (TRIUMEQ) 600-50-300 MG per tablet 1 tablet  1 tablet Oral Daily Coralyn Pear B, MD   1 tablet at 07/17/22 0804   acetaminophen (TYLENOL) tablet 650 mg  650 mg Oral Q6H PRN Deloria Lair, NP   650 mg at 07/18/22 0817   albuterol (VENTOLIN HFA) 108 (90 Base) MCG/ACT inhaler 2 puff  2 puff Inhalation Q6H PRN Deloria Lair, NP       alum & mag hydroxide-simeth (MAALOX/MYLANTA) 200-200-20 MG/5ML suspension 30 mL  30 mL Oral Q4H PRN Dixon, Rashaun M, NP   30 mL at 07/14/22 1732   aspirin EC tablet 325 mg  325 mg Oral Daily Dixon, Rashaun M, NP   325 mg at 07/18/22 0817   brimonidine (ALPHAGAN) 0.2 % ophthalmic solution 1 drop  1 drop Both Eyes Q12H Dixon, Rashaun M, NP   1 drop at 07/18/22 3244   And   timolol (TIMOPTIC) 0.5 % ophthalmic solution 1 drop  1 drop Both Eyes Q12H Dixon, Rashaun M, NP   1 drop at 07/18/22 0102   diazepam (VALIUM) tablet 5 mg  5 mg Oral Q8H Coralyn Pear B, MD   5 mg at 07/18/22 0636   dicyclomine (BENTYL) capsule 10 mg  10 mg Oral TID AC & HS Dixon, Rashaun M, NP   10 mg at 07/18/22 0641   docusate sodium (COLACE) capsule 200 mg  200 mg Oral BID Massengill, Ovid Curd, MD   200 mg at 07/18/22 0819   gabapentin (NEURONTIN) capsule 600 mg  600 mg Oral Q8H Hoang, Orma Render B, MD   600 mg at  07/18/22 0636   gemfibrozil (LOPID) tablet 600 mg  600 mg Oral BID AC Coralyn Pear B, MD   600 mg at 07/18/22 0640   glimepiride (AMARYL) tablet 1 mg  1 mg Oral Q breakfast Coralyn Pear B, MD   1 mg at 07/18/22 0820   hydrOXYzine (ATARAX) tablet 25 mg  25 mg Oral TID PRN Deloria Lair, NP   25 mg at 07/18/22 0817   iloperidone (FANAPT) tablet 4 mg  4 mg Oral Q12H Coralyn Pear B, MD   4 mg at 07/18/22 0820   insulin aspart (novoLOG) injection 0-15 Units  0-15 Units Subcutaneous TID WC Coralyn Pear B, MD   3 Units at 07/18/22 0729   OLANZapine zydis (ZYPREXA) disintegrating tablet 5 mg  5 mg Oral Q8H PRN Massengill, Ovid Curd, MD       And   LORazepam (ATIVAN) tablet 1 mg  1 mg Oral PRN Massengill, Ovid Curd, MD       And   ziprasidone (GEODON) injection 20 mg  20 mg Intramuscular PRN Massengill, Ovid Curd, MD       losartan (COZAAR) tablet 50 mg  50 mg Oral Daily Coralyn Pear B, MD   50 mg at 07/18/22 0817   magnesium hydroxide (MILK OF MAGNESIA) suspension 30 mL  30 mL Oral Daily  PRN Deloria Lair, NP   30 mL at 07/15/22 2106   meclizine (ANTIVERT) tablet 25 mg  25 mg Oral TID PRN Deloria Lair, NP       metFORMIN (GLUCOPHAGE) tablet 1,000 mg  1,000 mg Oral BID WC Coralyn Pear B, MD   1,000 mg at 07/18/22 0817   mirtazapine (REMERON) tablet 30 mg  30 mg Oral QHS Coralyn Pear B, MD   30 mg at 07/17/22 2124   pantoprazole (PROTONIX) EC tablet 40 mg  40 mg Oral Daily Anette Riedel M, NP   40 mg at 07/18/22 0817   rosuvastatin (CRESTOR) tablet 40 mg  40 mg Oral Daily Anette Riedel M, NP   40 mg at 07/18/22 1749   Semaglutide (1 MG/DOSE) SOPN 2 mg  2 mg Subcutaneous Q Mon Dixon, Rashaun M, NP       senna (SENOKOT) tablet 8.6 mg  1 tablet Oral Daily Dixon, Rashaun M, NP   8.6 mg at 07/18/22 0817   sucralfate (CARAFATE) tablet 1 g  1 g Oral QID PRN Coralyn Pear B, MD       tamsulosin [facility]) capsule 0.4 mg  0.4 mg Oral Daily Doren Custard, Rashaun M, NP   0.4 mg at 07/18/22 0817    venlafaxine XR (EFFEXOR-XR) 24 hr capsule 75 mg  75 mg Oral Q breakfast Massengill, Ovid Curd, MD   75 mg at 07/18/22 4496   PTA Medications: Medications Prior to Admission  Medication Sig Dispense Refill Last Dose   abacavir-dolutegravir-lamiVUDine (TRIUMEQ) 600-50-300 MG tablet Take 1 tablet by mouth at bedtime. 30 tablet 6    albuterol (PROVENTIL HFA;VENTOLIN HFA) 108 (90 Base) MCG/ACT inhaler Inhale 2 puffs into the lungs every 6 (six) hours as needed for wheezing or shortness of breath. 1 Inhaler 2    aspirin EC 325 MG tablet Take 325 mg by mouth daily.      benztropine (COGENTIN) 0.5 MG tablet Take 1.5 mg by mouth daily. Pt unsure of dose      brimonidine-timolol (COMBIGAN) 0.2-0.5 % ophthalmic solution Place 1 drop into both eyes every 12 (twelve) hours.      diazepam (VALIUM) 5 MG tablet Take 1 tablet (5 mg total) by mouth 3 (three) times daily. 30 tablet 0    dicyclomine (BENTYL) 10 MG capsule Take 10 mg by mouth 4 (four) times daily -  before meals and at bedtime.      divalproex (DEPAKOTE ER) 500 MG 24 hr tablet Take 500 mg by mouth at bedtime.      docusate sodium (COLACE) 250 MG capsule Take 250 mg by mouth 2 (two) times daily.      donepezil (ARICEPT) 10 MG tablet Take 5 mg by mouth daily.      FANAPT 12 MG TABS Take 12 mg by mouth 2 (two) times daily.      Fluocinolone Acetonide 0.01 % OIL Place 2-4 drops in ear(s) every other day. At bedtime for dry skin      fluticasone (FLOVENT HFA) 220 MCG/ACT inhaler Inhale 2 puffs into the lungs 2 (two) times daily. Rinse out mouth afterwards 1 Inhaler 12    gabapentin (NEURONTIN) 400 MG capsule Take 2 capsules (800 mg total) by mouth 3 (three) times daily. 180 capsule 2    gemfibrozil (LOPID) 600 MG tablet Take 600 mg by mouth 2 (two) times daily before a meal.      glimepiride (AMARYL) 4 MG tablet Take 4 mg by mouth daily with breakfast.  glycopyrrolate (ROBINUL) 2 MG tablet Take 0.5 mg by mouth 2 (two) times daily.      hydrOXYzine  (ATARAX) 25 MG tablet Take 1 tablet (25 mg total) by mouth 3 (three) times daily as needed for anxiety. (Patient not taking: Reported on 06/10/2022) 30 tablet 0    Insulin Degludec (TRESIBA FLEXTOUCH) 200 UNIT/ML SOPN Inject 10 Units into the skin daily. (Patient taking differently: Inject 34 Units into the skin daily.)      losartan (COZAAR) 50 MG tablet Take 50 mg by mouth daily.      meclizine (ANTIVERT) 25 MG tablet Take 1 tablet (25 mg total) by mouth 3 (three) times daily as needed for dizziness. 30 tablet 0    memantine (NAMENDA) 10 MG tablet Take 10 mg by mouth 2 (two) times daily.      metFORMIN (GLUCOPHAGE) 1000 MG tablet Take 1,000 mg by mouth 2 (two) times daily with a meal.      methocarbamol (ROBAXIN) 500 MG tablet Take 1 tablet (500 mg total) by mouth 4 (four) times daily. (Patient not taking: Reported on 06/10/2022) 120 tablet 0    mirtazapine (REMERON) 45 MG tablet Take 1 tablet (45 mg total) by mouth at bedtime. 90 tablet 2    mometasone (ELOCON) 0.1 % ointment Apply 1 application topically See admin instructions. Apply 4 drops to ear canal at bedtime every other night for itching and dry skin. (Patient not taking: Reported on 06/10/2022)      nitroGLYCERIN (NITROSTAT) 0.4 MG SL tablet Place 0.4 mg under the tongue every 5 (five) minutes as needed for chest pain. (Patient not taking: Reported on 07/12/2022)      Omega-3 Fatty Acids (FISH OIL) 1000 MG CAPS Take 1,000 mg by mouth daily.      omeprazole (PRILOSEC) 40 MG capsule Take 40 mg by mouth 2 (two) times daily.      OZEMPIC, 1 MG/DOSE, 4 MG/3ML SOPN Inject 2 mg into the skin every Monday.      rosuvastatin (CRESTOR) 40 MG tablet Take 40 mg by mouth daily.       senna (SENOKOT) 8.6 MG TABS tablet Take 1 tablet by mouth daily.      sucralfate (CARAFATE) 1 g tablet Take 1 g by mouth 4 (four) times daily.      tamsulosin (FLOMAX) 0.4 MG CAPS capsule Take 0.4 mg by mouth daily.      venlafaxine XR (EFFEXOR-XR) 150 MG 24 hr capsule  Take 1 capsule (150 mg total) by mouth daily with breakfast. 30 capsule 2    ziprasidone (GEODON) 40 MG capsule Take 1 capsule (40 mg total) by mouth 2 (two) times daily with a meal. 60 capsule 2     Patient Stressors: Financial difficulties   Other: does not like living alone    Patient Strengths: Ability for insight  [employer] logistics/support/administrative officer  Motivation for treatment/growth   Treatment Modalities: Medication Management, Group therapy, Case management,  1 to 1 session with clinician, Psychoeducation, Recreational therapy.   Physician Treatment Plan for Primary Diagnosis: Severe episode of recurrent major depressive disorder, without psychotic features ([place]) Long Term Goal(s):     Short Term Goals: Ability to identify changes in lifestyle to reduce recurrence of condition will improve Ability to verbalize feelings will improve Ability to disclose and discuss suicidal ideas Ability to demonstrate self-control will improve Ability to identify and develop effective coping behaviors will improve Ability to maintain clinical measurements within normal limits will improve Compliance with prescribed medications will  improve Ability to identify triggers associated with substance abuse/mental health issues will improve  Medication Management: Evaluate patient's response, side effects, and tolerance of medication regimen.  Therapeutic Interventions: 1 to 1 sessions, Unit Group sessions and Medication administration.  Evaluation of Outcomes: Progressing  Physician Treatment Plan for Secondary Diagnosis: Principal Problem:   Severe episode of recurrent major depressive disorder, without psychotic features ([place]) Active Problems:   Chronic diastolic heart failure ([facility])   HTN (hypertension)   Diabetes ([place])   HIV positive ([place])   Generalized anxiety disorder  Long Term Goal(s):     Short Term Goals: Ability to identify changes in lifestyle to reduce recurrence of condition will  improve Ability to verbalize feelings will improve Ability to disclose and discuss suicidal ideas Ability to demonstrate self-control will improve Ability to identify and develop effective coping behaviors will improve Ability to maintain clinical measurements within normal limits will improve Compliance with prescribed medications will improve Ability to identify triggers associated with substance abuse/mental health issues will improve     Medication Management: Evaluate patient's response, side effects, and tolerance of medication regimen.  Therapeutic Interventions: 1 to 1 sessions, Unit Group sessions and Medication administration.  Evaluation of Outcomes: Progressing   RN Treatment Plan for Primary Diagnosis: Severe episode of recurrent major depressive disorder, without psychotic features ([place]) Long Term Goal(s): Knowledge of disease and therapeutic regimen to maintain health will improve  Short Term Goals: Ability to remain free from injury will improve, Ability to verbalize frustration and anger appropriately will improve, Ability to demonstrate self-control, Ability to participate in decision making will improve, Ability to verbalize feelings will improve, Ability to disclose and discuss suicidal ideas, Ability to identify and develop effective coping behaviors will improve, and Compliance with prescribed medications will improve  Medication Management: RN will administer medications as ordered by provider, will assess and evaluate patient's response and provide education to patient for prescribed medication. RN will report any adverse and/or side effects to prescribing provider.  Therapeutic Interventions: 1 on 1 counseling sessions, Psychoeducation, Medication administration, Evaluate responses to treatment, Monitor vital signs and CBGs as ordered, Perform/monitor CIWA, COWS, AIMS and Fall Risk screenings as ordered, Perform wound care treatments as ordered.  Evaluation of  Outcomes: Progressing   LCSW Treatment Plan for Primary Diagnosis: Severe episode of recurrent major depressive disorder, without psychotic features ([place]) Long Term Goal(s): Safe transition to appropriate next level of care at discharge, Engage patient in therapeutic group addressing interpersonal concerns.  Short Term Goals: Engage patient in aftercare planning with referrals and resources, Increase social support, Increase ability to appropriately verbalize feelings, Increase emotional regulation, Facilitate acceptance of mental health diagnosis and concerns, Facilitate patient progression through stages of change regarding substance use diagnoses and concerns, and Identify triggers associated with mental health/substance abuse issues  Therapeutic Interventions: Assess for all discharge needs, 1 to 1 time with Social worker, Explore available resources and support systems, Assess for adequacy in community support network, Educate family and significant other(s) on suicide prevention, Complete Psychosocial Assessment, Interpersonal group therapy.  Evaluation of Outcomes: Progressing   Progress in Treatment: Attending groups: Yes. Participating in groups: Yes. Taking medication as prescribed: Yes. Toleration medication: Yes. Family/Significant other contact made: No, will contact:  Does not consent Patient understands diagnosis: Yes. Discussing patient identified problems/goals with staff: Yes. Medical problems stabilized or resolved: Yes. Denies suicidal/homicidal ideation: Yes. Issues/concerns per patient self-inventory: Yes. Other:   New problem(s) identified: No, Describe:  None reported  New Short Term/Long Term  Goal(s):Medication Stabilization  Patient Goals:  Coping Skills  Discharge Plan or Barriers:   Reason for Continuation of Hospitalization: Depression Medical Issues Medication stabilization Suicidal ideation  Estimated Length of Stay: 3-7 Days  Last [place]  Suicide Severity Risk Score: [place] Admission (Current) from 07/13/2022 in [facility] ED from 07/12/2022 in [place] ED from 07/10/2022 in [place] [facility] Low Risk Moderate Risk No Risk       Last PHQ 2/9 Scores:    09/13/2021    9:02 AM 03/05/2021    9:21 AM 01/26/2018   11:03 AM  Depression screen PHQ 2/9  Decreased Interest 0 0 0  Down, Depressed, Hopeless 0 0 1  PHQ - 2 Score 0 0 1     medication stabilization, elimination of SI thoughts, development of comprehensive mental wellness plan.   Scribe for Treatment Team: Windle Guard, LCSW 07/18/2022 9:50 AM

## 2022-07-18 NOTE — Progress Notes (Signed)
   07/18/22 2000  Psych Admission Type (Psych Patients Only)  Admission Status Voluntary  Psychosocial Assessment  Patient Complaints Anxiety  Eye Contact Fair  Facial Expression Anxious  Affect Appropriate to circumstance  Speech Logical/coherent  Interaction Assertive  Motor Activity Slow  Appearance/Hygiene Unremarkable  Behavior Characteristics Cooperative  Mood Pleasant  Thought Process  Coherency WDL  Content WDL  Delusions None reported or observed  Perception WDL  Hallucination None reported or observed  Judgment Poor  Confusion None  Danger to Self  Current suicidal ideation? Denies  Self-Injurious Behavior No self-injurious ideation or behavior indicators observed or expressed   Agreement Not to Harm Self Yes  Description of Agreement verbal  Danger to Others  Danger to Others None reported or observed   Alert/oriented. Makes needs/concerns known to staff. Pleasant cooperative with staff. Denies SI/HI/A/V hallucinations. Med compliant. Patient states went to group.  Will encourage continue compliance and progression towards goals. Verbally contracted for safety. Will continue to monitor.
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
## 2022-07-18 NOTE — Discharge Instructions (Addendum)
-  Follow-up with your outpatient psychiatric provider -instructions on appointment date, time, and address (location) are provided to you in discharge paperwork.  -Take your psychiatric medications as prescribed at discharge - instructions are provided to you in the discharge paperwork  -Follow-up with outpatient primary care doctor and other specialists -for management of preventative medicine and any chronic medical disease.  -Recommend abstinence from alcohol, tobacco, and other illicit drug use at discharge.   -If your psychiatric symptoms recur, worsen, or if you have side effects to your psychiatric medications, call your outpatient psychiatric provider, 911, [phone] or go to the nearest emergency department.  -If suicidal thoughts occur, call your outpatient psychiatric provider, 911, [phone] or go to the nearest emergency department.  Naloxone (Narcan) can help reverse an overdose when given to the victim quickly.  [place] offers free naloxone kits and instructions/training on its use.  Add naloxone to your first aid kit and you can help save a life.   Pick up your free kit at the following locations:   [place]:  [facility], [address] [place] [address] ([phone]) [facility] [address] [place] [id number] ([phone]) [facility] [address] [place] [address]  [place]: [facility] [address] [place] [address] ([phone]) [facility] [address] [place]  [address] ([phone])  

## 2022-07-18 NOTE — Progress Notes (Signed)
   07/18/22 0500  Psych Admission Type (Psych Patients Only)  Admission Status Voluntary  Psychosocial Assessment  Patient Complaints Anxiety  Eye Contact Fair  Facial Expression Animated  Affect Appropriate to circumstance  Speech Logical/coherent  Interaction Assertive  Motor Activity Slow  Appearance/Hygiene Unremarkable  Behavior Characteristics Cooperative;Calm  Mood Pleasant  Thought Process  Coherency WDL  Content WDL  Delusions None reported or observed  Perception WDL  Hallucination None reported or observed  Judgment Poor  Confusion None  Danger to Self  Current suicidal ideation? Denies  Self-Injurious Behavior No self-injurious ideation or behavior indicators observed or expressed   Agreement Not to Harm Self Yes  Description of Agreement verbal  Danger to Others  Danger to Others None reported or observed   Alert/oriented. Makes needs/concerns known to staff. Pleasant cooperative with staff. Denies SI/HI/A/V hallucinations. Med compliant. PRN med given with good effect. Patient states went to group. Will encourage continue compliance and progression towards goals. Verbally contracted for safety. Will continue to monitor.

## 2022-07-18 NOTE — Plan of Care (Signed)
  Problem: Safety: Goal: Periods of time without injury will increase Outcome: Progressing

## 2022-07-18 NOTE — [facility] Group Notes (Signed)
Adult Psychoeducational Group Note  Date:  07/18/2022 Time:  9:50 AM  Group Topic/Focus:  Goals Group:   The focus of this group is to help patients establish daily goals to achieve during treatment and discuss how the patient can incorporate goal setting into their daily lives to aide in recovery.  Participation Level:  Active  Participation Quality:  Appropriate  Affect:  Appropriate  Cognitive:  Appropriate  Insight: Appropriate  Engagement in Group:  Engaged  Modes of Intervention:  Discussion  Additional Comments:  Patient did not set a goal.   Alric Seton 07/18/2022, 9:50 AM

## 2022-07-18 NOTE — Progress Notes (Signed)
   07/18/22 1329  Psych Admission Type (Psych Patients Only)  Admission Status Voluntary  Psychosocial Assessment  Patient Complaints Anxiety  Eye Contact Fair  Facial Expression Animated  Affect Appropriate to circumstance  Speech Logical/coherent  Interaction Assertive  Motor Activity Slow  Appearance/Hygiene Unremarkable  Behavior Characteristics Cooperative;Calm  Mood Pleasant  Aggressive Behavior  Effect No apparent injury  Thought Process  Coherency WDL  Content WDL  Delusions None reported or observed  Perception WDL  Hallucination None reported or observed  Judgment Poor  Confusion None  Danger to Self  Current suicidal ideation? Denies  Self-Injurious Behavior No self-injurious ideation or behavior indicators observed or expressed   Agreement Not to Harm Self Yes  Description of Agreement verbal  Danger to Others  Danger to Others None reported or observed

## 2022-07-18 NOTE — Group Note (Signed)
Date:  07/18/2022 Time:  4:24 PM  Group Topic/Focus:  Recovery Goals:   The focus of this group is to identify appropriate goals for recovery and establish a plan to achieve them.    Participation Level:  Active  Participation Quality:  Appropriate  Affect:  Appropriate  Cognitive:  Appropriate  Insight: Appropriate  Engagement in Group:  Engaged  Modes of Intervention:  Exploration  Additional Comments:  Patient wants to use coping skills learned in order to delay and or stop the urge to use drugs or alcohol.    Danny Cook 07/18/2022, 4:24 PM

## 2022-07-19 LAB — GLUCOSE, CAPILLARY
Glucose-Capillary: 143 mg/dL — ABNORMAL HIGH (ref 70–99)
Glucose-Capillary: 146 mg/dL — ABNORMAL HIGH (ref 70–99)
Glucose-Capillary: 139 mg/dL — ABNORMAL HIGH (ref 70–99)
Glucose-Capillary: 178 mg/dL — ABNORMAL HIGH (ref 70–99)
Glucose-Capillary: 177 mg/dL — ABNORMAL HIGH (ref 70–99)

## 2022-07-19 NOTE — Progress Notes (Signed)
   07/19/22 1300  Psych Admission Type (Psych Patients Only)  Admission Status Voluntary  Psychosocial Assessment  Patient Complaints Anxiety  Eye Contact Fair  Facial Expression Anxious  Affect Appropriate to circumstance  Speech Logical/coherent  Interaction Assertive  Motor Activity Slow  Appearance/Hygiene Unremarkable  Behavior Characteristics Cooperative;Calm  Mood Pleasant  Thought Process  Coherency WDL  Content WDL  Delusions None reported or observed  Perception WDL  Hallucination None reported or observed  Judgment Limited  Confusion None  Danger to Self  Current suicidal ideation? Denies  Self-Injurious Behavior No self-injurious ideation or behavior indicators observed or expressed   Agreement Not to Harm Self Yes  Description of Agreement verbal contract  Danger to Others  Danger to Others None reported or observed
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
## 2022-07-19 NOTE — Group Note (Signed)
LCSW Group Therapy Note  Group Date: 07/19/2022 Start Time: 1010 End Time: 1110   Type of Therapy and Topic:  Group Therapy: Anger Cues and Responses  Participation Level:  Active   Description of Group:   In this group, patients learned how to recognize the physical, cognitive, emotional, and behavioral responses they have to anger-provoking situations.  They identified a recent time they became angry and how they reacted.  They analyzed how their reaction was possibly beneficial and how it was possibly unhelpful.  The group discussed a variety of healthier coping skills that could help with such a situation in the future.  Focus was placed on how helpful it is to recognize the underlying emotions to our anger, because working on those can lead to a more permanent solution as well as our ability to focus on the important rather than the urgent.  Therapeutic Goals: Patients will remember their last incident of anger and how they felt emotionally and physically, what their thoughts were at the time, and how they behaved. Patients will identify how their behavior at that time worked for them, as well as how it worked against them. Patients will explore possible new behaviors to use in future anger situations. Patients will learn that anger itself is normal and cannot be eliminated, and that healthier reactions can assist with resolving conflict rather than worsening situations.  Summary of Patient Progress: Eitan was active during the group. He shared a recent occurrence wherein feeling that someone was trying to control him and choose his friends, which led to anger. He demonstrated good insight into the subject matter, was respectful of peers, and participated throughout the entire session.  He was the sole participant who shared that he has few anger issues and this is because he has chosen to be assertive asking for what he wants/needs as opposed to getting outwardly or inwardly  angry.  Therapeutic Modalities:   Cognitive Behavioral Therapy    Marquette Old 07/19/2022  2:44 PM

## 2022-07-19 NOTE — Progress Notes (Signed)
[facility] Group Notes:  (Nursing/MHT/Case Management/Adjunct)  Date:  07/19/2022  Time:  2000  Type of Therapy:   wrap up group  Participation Level:  Minimal  Participation Quality:  Sharing  Affect:  Lethargic  Cognitive:  Lacking  Insight:  Limited  Engagement in Group:  Limited  Modes of Intervention:  Clarification, Education, and Support  Summary of Progress/Problems: Pt joined at the end of group after everyone had shared. Pt did answer prompts but said they were hard because he had just woken up. Positive thinking and positive change were discussed.   Shellia Cleverly 07/19/2022, 11:36 PM

## 2022-07-20 LAB — GLUCOSE, CAPILLARY
Glucose-Capillary: 141 mg/dL — ABNORMAL HIGH (ref 70–99)
Glucose-Capillary: 99 mg/dL (ref 70–99)
Glucose-Capillary: 151 mg/dL — ABNORMAL HIGH (ref 70–99)
Glucose-Capillary: 114 mg/dL — ABNORMAL HIGH (ref 70–99)
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
## 2022-07-20 NOTE — Plan of Care (Signed)
  Problem: Education: Goal: Knowledge of [place] [facility] Education information/materials will improve Outcome: Progressing Goal: Emotional status will improve Outcome: Progressing Goal: Mental status will improve Outcome: Progressing Goal: Verbalization of understanding the information provided will improve Outcome: Progressing   Problem: Activity: Goal: Interest or engagement in activities will improve Outcome: Progressing Goal: Sleeping patterns will improve Outcome: Progressing   Problem: Coping: Goal: Ability to verbalize frustrations and anger appropriately will improve Outcome: Progressing Goal: Ability to demonstrate self-control will improve Outcome: Progressing   Problem: Health Behavior/Discharge Planning: Goal: Identification of resources available to assist in meeting health care needs will improve Outcome: Progressing Goal: Compliance with treatment plan for underlying cause of condition will improve Outcome: Progressing   Problem: Physical Regulation: Goal: Ability to maintain clinical measurements within normal limits will improve Outcome: Progressing   Problem: Safety: Goal: Periods of time without injury will increase Outcome: Progressing   Problem: Education: Goal: Utilization of techniques to improve thought processes will improve Outcome: Progressing Goal: Knowledge of the prescribed therapeutic regimen will improve Outcome: Progressing   Problem: Activity: Goal: Interest or engagement in leisure activities will improve Outcome: Progressing Goal: Imbalance in normal sleep/wake cycle will improve Outcome: Progressing   Problem: Coping: Goal: Coping ability will improve Outcome: Progressing Goal: Will verbalize feelings Outcome: Progressing   Problem: Health Behavior/Discharge Planning: Goal: Ability to make decisions will improve Outcome: Progressing Goal: Compliance with therapeutic regimen will improve Outcome: Progressing    Problem: Role Relationship: Goal: Will demonstrate positive changes in social behaviors and relationships Outcome: Progressing   Problem: Self-Concept: Goal: Will verbalize positive feelings about self Outcome: Progressing Goal: Level of anxiety will decrease Outcome: Progressing   Problem: Education: Goal: Ability to describe self-care measures that may prevent or decrease complications (Diabetes Survival Skills Education) will improve Outcome: Progressing Goal: Individualized Educational Video(s) Outcome: Progressing   Problem: Coping: Goal: Ability to adjust to condition or change in health will improve Outcome: Progressing   Problem: Fluid Volume: Goal: Ability to maintain a balanced intake and output will improve Outcome: Progressing   Problem: Health Behavior/Discharge Planning: Goal: Ability to identify and utilize available resources and services will improve Outcome: Progressing Goal: Ability to manage health-related needs will improve Outcome: Progressing   Problem: Metabolic: Goal: Ability to maintain appropriate glucose levels will improve Outcome: Progressing   Problem: Nutritional: Goal: Maintenance of adequate nutrition will improve Outcome: Progressing Goal: Progress toward achieving an optimal weight will improve Outcome: Progressing   Problem: Tissue Perfusion: Goal: Adequacy of tissue perfusion will improve Outcome: Progressing

## 2022-07-20 NOTE — [facility] Group Notes (Signed)
Pt attended goals group

## 2022-07-20 NOTE — Progress Notes (Signed)
Adult Psychoeducational Group Note  Date:  07/20/2022 Time:  9:14 PM  Group Topic/Focus:  Wrap-Up Group:   The focus of this group is to help patients review their daily goal of treatment and discuss progress on daily workbooks.  Participation Level:  Active  Participation Quality:  Appropriate  Affect:  Appropriate  Cognitive:  Appropriate  Insight: Appropriate  Engagement in Group:  Engaged  Modes of Intervention:  Discussion  Additional Comments:   Pt states that he had a good day and is excited for D/C tomorrow. Pt states that he has a full set of responsibilities that he needs to stick to but is excited to be able to be able to continue on. Pt denies everything.  Gerhard Perches 07/20/2022, 9:14 PM

## 2022-07-20 NOTE — Progress Notes (Signed)
D: Patient alert and oriented. Affect/mood reported as improving. Denies SI, HI, AVH, and pain. He is much improved since he originally came in when Probation officer assessed him.  A: Scheduled medication administered to patient, per MD orders. Support and encouragement provided. Routine safety checks conducted every 15 minutes. Patient informed to notify staff with problems or concerns.   R: No adverse drug reactions noted. Patient contracts for safety at this time. Patient compliant with medications and treatment plan. Patient receptive, calm and cooperative. Patient interacts well with others on unit. Patient remains safe at this time.

## 2022-07-20 NOTE — Plan of Care (Signed)
  Problem: Safety: Goal: Periods of time without injury will increase Outcome: Progressing

## 2022-07-20 NOTE — [facility] Group Notes (Signed)
Adult Psychoeducational Group Note  Date:  07/20/2022 Time:  10:38 AM  Group Topic/Focus:  Emotional Education:   The focus of this group is to discuss what feelings/emotions are, and how they are experienced.  Participation Level:  Active  Participation Quality:  Appropriate  Affect:  Appropriate  Cognitive:  Appropriate  Insight: Appropriate  Engagement in Group:  Engaged  Modes of Intervention:  Education  Additional Comments:  Today's Group "Don't Make Assumption's"   Danny Cook 07/20/2022, 10:38 AM

## 2022-07-20 NOTE — Progress Notes (Signed)
D- Patient alert and oriented. Patient affect/mood.reported "I feel great." Denies SI, HI, AVH, and pain. Patient states, "they said that I am supposed to be going home on Monday, and that makes me even more excited, so I am going to try to have a good nights ready to prepare myself for tomorrow."     07/20/22 2200  Psych Admission Type (Psych Patients Only)  Admission Status Voluntary  Psychosocial Assessment  Patient Complaints Anxiety  Eye Contact Fair  Facial Expression Anxious  Affect Appropriate to circumstance  Speech Logical/coherent  Interaction Assertive  Motor Activity Slow  Appearance/Hygiene Unremarkable  Behavior Characteristics Cooperative  Mood Pleasant  Thought Process  Coherency WDL  Content WDL  Delusions None reported or observed  Perception WDL  Hallucination None reported or observed  Judgment Limited  Confusion None  Danger to Self  Current suicidal ideation? Denies  Agreement Not to Harm Self Yes  Description of Agreement verbal  Danger to Others  Danger to Others None reported or observed  Danger to Others Abnormal  Harmful Behavior to others No threats or harm toward other people  Destructive Behavior No threats or harm toward property    A- Scheduled medications administered to patient, per MD orders. Support and encouragement provided.  Routine safety checks conducted every 15 minutes.  Patient informed to notify staff with problems or concerns. R- No adverse drug reactions noted. Patient contracts for safety at this time. Patient compliant with medications and treatment plan. Patient receptive, calm, and cooperative. Patient interacts well with others on the unit.  Patient remains safe at this time.

## 2022-07-20 NOTE — Progress Notes (Signed)
   07/20/22 0000  Psych Admission Type (Psych Patients Only)  Admission Status Voluntary  Psychosocial Assessment  Patient Complaints Anxiety  Eye Contact Fair  Facial Expression Anxious  Affect Appropriate to circumstance  Speech Logical/coherent  Interaction Assertive  Motor Activity Slow  Appearance/Hygiene Unremarkable  Behavior Characteristics Cooperative  Mood Pleasant  Thought Process  Coherency WDL  Content WDL  Delusions None reported or observed  Perception WDL  Hallucination None reported or observed  Judgment Limited  Confusion None  Danger to Self  Current suicidal ideation? Denies  Self-Injurious Behavior No self-injurious ideation or behavior indicators observed or expressed   Agreement Not to Harm Self Yes  Description of Agreement verbal  Danger to Others  Danger to Others Reported or observed   Alert/oriented. Makes needs/concerns known to staff. Pleasant cooperative with staff. Denies SI/HI/A/V hallucinations. Med compliant. PRN med given with good effect. Patient states went to group. Will encourage continue compliance and progression towards goals. Verbally contracted for safety. Will continue to monitor.

## 2022-07-21 LAB — GLUCOSE, CAPILLARY
Glucose-Capillary: 122 mg/dL — ABNORMAL HIGH (ref 70–99)
Glucose-Capillary: 119 mg/dL — ABNORMAL HIGH (ref 70–99)
Glucose-Capillary: 183 mg/dL — ABNORMAL HIGH (ref 70–99)
Glucose-Capillary: 120 mg/dL — ABNORMAL HIGH (ref 70–99)

## 2022-07-21 NOTE — Plan of Care (Signed)
  Problem: Activity: ?Goal: Interest or engagement in activities will improve ?Outcome: Progressing ?Goal: Sleeping patterns will improve ?Outcome: Progressing ?  ?Problem: Coping: ?Goal: Ability to verbalize frustrations and anger appropriately will improve ?Outcome: Progressing ?Goal: Ability to demonstrate self-control will improve ?Outcome: Progressing ?  ?

## 2022-07-21 NOTE — Progress Notes (Signed)
   07/21/22 0800  Psych Admission Type (Psych Patients Only)  Admission Status Voluntary  Psychosocial Assessment  Patient Complaints Anxiety  Eye Contact Fair  Facial Expression Anxious  Affect Appropriate to circumstance  Speech Logical/coherent  Interaction Assertive  Motor Activity Slow  Appearance/Hygiene Unremarkable  Behavior Characteristics Cooperative  Mood Pleasant  Aggressive Behavior  Effect No apparent injury  Thought Process  Coherency WDL  Content WDL  Delusions None reported or observed  Perception WDL  Hallucination None reported or observed  Judgment Limited  Confusion None  Danger to Self  Current suicidal ideation? Denies  Self-Injurious Behavior No self-injurious ideation or behavior indicators observed or expressed   Agreement Not to Harm Self Yes  Description of Agreement verbal  Danger to Others  Danger to Others None reported or observed  Danger to Others Abnormal  Harmful Behavior to others No threats or harm toward other people  Destructive Behavior No threats or harm toward property

## 2022-07-21 NOTE — [facility] Group Notes (Signed)
Adult Psychoeducational Group Note  Date:  07/21/2022 Time:  10:22 AM  Group Topic/Focus:  Goals Group:   The focus of this group is to help patients establish daily goals to achieve during treatment and discuss how the patient can incorporate goal setting into their daily lives to aide in recovery.  Participation Level:  Active  Additional Comments:  There wasn't a formal group today due to staffing, but I was able to get a 1:1 goals review with the pt. The patient's goal was to find new coping skills.   Miko Markwood T Domonik Levario 07/21/2022, 10:22 AM

## 2022-07-21 NOTE — Progress Notes (Signed)
   07/21/22 2100  Psych Admission Type (Psych Patients Only)  Admission Status Voluntary  Psychosocial Assessment  Patient Complaints Anxiety  Eye Contact Fair  Facial Expression Animated;Anxious  Affect Appropriate to circumstance  Speech Logical/coherent  Interaction Assertive  Motor Activity Slow  Appearance/Hygiene Unremarkable  Behavior Characteristics Cooperative;Anxious  Mood Anxious;Pleasant  Aggressive Behavior  Effect No apparent injury  Thought Process  Coherency WDL  Content WDL  Delusions None reported or observed  Perception WDL  Hallucination None reported or observed  Judgment Limited  Confusion None  Danger to Self  Current suicidal ideation? Denies  Self-Injurious Behavior No self-injurious ideation or behavior indicators observed or expressed   Agreement Not to Harm Self Yes  Description of Agreement verbal  Danger to Others  Danger to Others None reported or observed  Danger to Others Abnormal  Harmful Behavior to others No threats or harm toward other people  Destructive Behavior No threats or harm toward property
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
## 2022-07-21 NOTE — BHH Group Notes (Signed)
Patient did not attend the AA group. 

## 2022-07-21 NOTE — Group Note (Signed)
Recreation Therapy Group Note   Group Topic:Health and Wellness  Group Date: 07/21/2022 Start Time: 0933 End Time: 0958 Facilitators: Ariah Mower-McCall, LRT,CTRS Location: [address] Dayroom   Goal Area(s) Addresses:  Patient will verbalize benefit of exercise during group session. Patient will verbalize an exercise that can be completed in their hospital room during admission. Patient will identify an exercise that can be completed post d/c. Patient will acknowledge benefits of exercise when used as a coping mechanism.   Group Description:  Exercise.  LRT and patients discussed the importance of exercise and how it benefits the individual.  LRT then explained to patients, they would be leading the group.  LRT explained each person would have the chance to lead the group in an exercise, stretch or dance move of their choosing.  Patients were informed to be mindful of the exercises they choose and to take in consideration their peers and any limitations they may have.  Patients were encouraged to have a good time and move!   Affect/Mood: Appropriate   Participation Level: Active   Participation Quality: Independent   Behavior: Appropriate   Speech/Thought Process: Focused   Insight: Good   Judgement: Good   Modes of Intervention: Music and Exercise   Patient Response to Interventions:  Engaged   Education Outcome:  Acknowledges education and In group clarification offered    Clinical Observations/Individualized Feedback: Pt was bright and completed the majority of the exercises sitting in a chair.  Pt was also singing along with the music while it played.  Pt was appropriate throughout group session.    Plan: Continue to engage patient in RT group sessions 2-3x/week.   Zainah Steven-McCall, LRT,CTRS 07/21/2022 1:00 PM

## 2022-07-21 NOTE — Progress Notes (Signed)
   07/21/22 0646  15 Minute Checks  Location Bedroom  Visual Appearance Calm  Behavior Composed  Sleep (Behavioral Health Patients Only)  Calculate sleep? (Click Yes once per 24 hr at 0600 safety check) Yes  Documented sleep last 24 hours 9.5

## 2022-07-21 NOTE — BHH Group Notes (Signed)
Spiritual care group on grief and loss facilitated by chaplain Leopold Smyers Andree Elk and Lysle Morales, counseling intern.   Group Goal: Support / Education around grief and loss  Members engage in facilitated group support and psycho-social education.  Group Description:  Following introductions and group rules, group members engaged in facilitated group dialogue and support around topic of loss, with particular support around experiences of loss in their lives. Group Identified types of loss (relationships / self / things) and identified patterns, circumstances, and changes that precipitate losses. Reflected on thoughts / feelings around loss, normalized grief responses, and recognized variety in grief experience. Group encouraged individual reflection on safe space and on the coping skills that they are already utilizing.   Group drew on Adlerian / Rogerian and narrative framework  Patient Progress: [place] actively participated in as he came in for the last 15-20 minutes.  He was able to descriptively share his "safe place" which includes the den in his home.  [place] shares a great love of music.  He explored his coping skills.

## 2022-07-22 LAB — GLUCOSE, CAPILLARY
Glucose-Capillary: 141 mg/dL — ABNORMAL HIGH (ref 70–99)
Glucose-Capillary: 86 mg/dL (ref 70–99)

## 2022-07-22 MED ORDER — VENLAFAXINE HCL ER 75 MG PO CP24: 75.0000 mg | ORAL_CAPSULE | Freq: Every day | ORAL | 0 refills | Status: AC

## 2022-07-22 MED ORDER — MIRTAZAPINE 30 MG PO TABS: 30.0000 mg | ORAL_TABLET | Freq: Every day | ORAL | 0 refills | Status: AC

## 2022-07-22 MED ORDER — HYDROXYZINE HCL 25 MG PO TABS: 25.0000 mg | ORAL_TABLET | Freq: Three times a day (TID) | ORAL | 0 refills | Status: AC | PRN

## 2022-07-22 MED ORDER — ILOPERIDONE 4 MG PO TABS: 4.0000 mg | ORAL_TABLET | Freq: Two times a day (BID) | ORAL | 0 refills | Status: AC

## 2022-07-22 MED ORDER — DIAZEPAM 2 MG PO TABS: 2.0000 mg | ORAL_TABLET | Freq: Three times a day (TID) | ORAL | 0 refills | Status: AC

## 2022-07-22 MED ORDER — GABAPENTIN 300 MG PO CAPS: 600.0000 mg | ORAL_CAPSULE | Freq: Three times a day (TID) | ORAL | 0 refills | Status: AC

## 2022-07-22 NOTE — [facility] Suicide Risk Assessment (Signed)
[facility] Discharge Suicide Risk Assessment   Principal Problem: Severe episode of recurrent major depressive disorder, without psychotic features ([place]) Discharge Diagnoses: Principal Problem:   Severe episode of recurrent major depressive disorder, without psychotic features ([place]) Active Problems:   Chronic diastolic heart failure (HCC)   HTN (hypertension)   Diabetes ([place])   HIV positive ([place])   Generalized anxiety disorder   Total Time spent with patient: 15 minutes   Danny Cook. is a 62 y.o., male with a past psychiatric history significant for MDD with psychotic features who presents to the [place] from  [facility] Emergency Department for evaluation and management of SI with plan to overdose on medications.   During the patient's hospitalization, patient had extensive initial psychiatric evaluation, and follow-up psychiatric evaluations every day.  Psychiatric diagnoses provided upon initial assessment:  MDD (major depressive disorder), recurrent severe, without psychosis ([place])   Chronic diastolic heart failure (HCC)   HTN (hypertension)   Diabetes (HCC)   HIV positive (HCC)   Generalized anxiety disorder Dependent personality d/o   Patient's psychiatric medications were adjusted on admission:  -Restart home  Effexor-XR 75 mg daily with breakfast for depressive symptoms and anxiety -Decrease Remeron from 45 mg to 30 mg qHS for insomnia, poor appetite and MDD  -Decrease Geodon from 40 mg bid to 20 mg BID with meals for 4 doses -Start Fanapt taper (home dose of 12 mg BID): 8 mg q12H for 3 doses -> 6 mg q12H on 1/17 -> 4 mg q12H on 1/18 -> 2 mg q12H on 1/19 -Decrease Neurontin from 800 mg to 600 mg q8H for neuropathy and anxiety -Continue home Valium 5 mg q8H for anxiety -STOP home Cogentin, Arecept, and Namenda due to patient not taking/filling -STOP home Depakote due to polypharmacy  During the hospitalization, other adjustments were made to  the patient's psychiatric medication regimen:  Fanapt taper paused at 4 mg bid - this is effective dose for treating paranoia (psychotic symptom of MDD)   -Valium tapered from 5 mg tid to 2 mg tid   Patient's care was discussed during the interdisciplinary team meeting every day during the hospitalization.  The patient denied having side effects to prescribed psychiatric medication.  Gradually, patient started adjusting to milieu. The patient was evaluated each day by a clinical provider to ascertain response to treatment. Improvement was noted by the patient's report of decreasing symptoms, improved sleep and appetite, affect, medication tolerance, behavior, and participation in unit programming.  Patient was asked each day to complete a self inventory noting mood, mental status, pain, new symptoms, anxiety and concerns.    Symptoms were reported as significantly decreased or resolved completely by discharge.   On day of discharge, the patient reports that their mood is stable. The patient denied having suicidal thoughts for more than 48 hours prior to discharge.  Patient denies having homicidal thoughts.  Patient denies having auditory hallucinations.  Patient denies any visual hallucinations or other symptoms of psychosis. The patient was motivated to continue taking medication with a goal of continued improvement in mental health.   The patient reports their target psychiatric symptoms of depression and suicidal thoughts, all responded well to the psychiatric medications, and the patient reports overall benefit other psychiatric hospitalization. Supportive psychotherapy was provided to the patient. The patient also participated in regular group therapy while hospitalized. Coping skills, problem solving as well as relaxation therapies were also part of the unit programming.  Labs were reviewed with the  patient, and abnormal results were discussed with the patient.  The patient is able to  verbalize their individual safety plan to this provider.  # It is recommended to the patient to continue psychiatric medications as prescribed, after discharge from the hospital.    # It is recommended to the patient to follow up with your outpatient psychiatric provider and PCP.  # It was discussed with the patient, the impact of alcohol, drugs, tobacco have been there overall psychiatric and medical wellbeing, and total abstinence from substance use was recommended the patient.ed.  # Prescriptions provided or sent directly to preferred pharmacy at discharge. Patient agreeable to plan. Given opportunity to ask questions. Appears to feel comfortable with discharge.    # In the event of worsening symptoms, the patient is instructed to call the crisis hotline, 911 and or go to the nearest ED for appropriate evaluation and treatment of symptoms. To follow-up with primary care provider for other medical issues, concerns and or health care needs  # Patient was discharged to home, with a plan to follow up as noted below.      Psychiatric Specialty Exam  Presentation  General Appearance:  Casual; Fairly Groomed; Appropriate for Environment  Eye Contact: Good  Speech: Normal Rate; Clear and Coherent  Speech Volume: Normal  Handedness: Right   Mood and Affect  Mood: Euthymic  Duration of Depression Symptoms: Less than two weeks  Affect: Appropriate; Congruent; Full Range   Thought Process  Thought Processes: Linear  Descriptions of Associations:Intact  Orientation:Full (Time, Place and Person)  Thought Content:Logical  History of Schizophrenia/Schizoaffective disorder:No  Duration of Psychotic Symptoms:No data recorded Hallucinations:Hallucinations: None  Ideas of Reference:None  Suicidal Thoughts:Suicidal Thoughts: No  Homicidal Thoughts:Homicidal Thoughts: No   Sensorium  Memory: Immediate Good; Recent Good; Remote  Good  Judgment: Fair  Insight: Fair   Materials engineer: Fair  Attention Span: Fair  Recall: [employer] of Knowledge: Fair  Language: Fair   Psychomotor Activity  Psychomotor Activity: Psychomotor Activity: Normal   Assets  Assets: Desire for Improvement; Housing; Catering manager; Communication Skills   Sleep  Sleep: Sleep: Fair   Physical Exam: Physical Exam See discharge summary  ROS See discharge summary  Blood pressure 110/76, pulse 94, temperature 97.8 F (36.6 C), temperature source Oral, resp. rate 16, height '5\' 7"'$  (1.702 m), weight 87.5 kg, SpO2 100 %. Body mass index is 30.23 kg/m.  Mental Status Per Nursing Assessment::   On Admission:  Suicidal ideation indicated by patient  Demographic factors:  Male, Caucasian, Living alone Loss Factors:  NA Historical Factors:  NA Risk Reduction Factors:  Sense of responsibility to family, Positive social support, Positive therapeutic relationship, Positive coping skills or problem solving skills  Continued Clinical Symptoms:  Mdd - mood is stable, no psychotic sx, denying SI   Cognitive Features That Contribute To Risk:  None    Suicide Risk:  Mild:  There are no identifiable suicide plans, no associated intent, mild dysphoria and related symptoms, good self-control (both objective and subjective assessment), few other risk factors, and identifiable protective factors, including available and accessible social support.    Follow-up Information     [facility] on 07/24/2022.   Why: You have an appointment for medication management services on 07/24/22 at 9:20 am. This appointment will be held in person. Contact information: [place] [address] [address]. Call.   Why:  Please call to schedule an appointment for therapy services with Pam.  Please continue with this provider for peer support  services. Contact information: [place] [address] [phone]         [facility] Call.   Why: We have made a referral to their program, [facility].  Please follow up with this provider for ongoing services.  Your support team is aware of this referral and have agreed to assist you with following up.  Updated phone number is [phone] and updated fax number is: [phone]. Contact information: [address] [place] [address] [phone]                 Plan Of Care/Follow-up recommendations:   Activity: as tolerated  Diet: heart healthy  Other: -Follow-up with your outpatient psychiatric provider -instructions on appointment date, time, and address (location) are provided to you in discharge paperwork.  -Take your psychiatric medications as prescribed at discharge - instructions are provided to you in the discharge paperwork  -Follow-up with outpatient primary care doctor and other specialists -for management of preventative medicine and chronic medical disease  -Recommend abstinence from alcohol, tobacco, and other illicit drug use at discharge.   -If your psychiatric symptoms recur, worsen, or if you have side effects to your psychiatric medications, call your outpatient psychiatric provider, 911, [phone] or go to the nearest emergency department.  -If suicidal thoughts recur, call your outpatient psychiatric provider, 911, [phone] or go to the nearest emergency department.   Christoper Allegra, MD 07/22/2022, 10:29 AM

## 2022-07-22 NOTE — Progress Notes (Signed)
[name].  D/C'd Home per MD order.  Discussed with the patient and all questions fully answered.  An After Visit Summary was printed and given to the patient. Patient received prescription.  D/c education completed with patient including follow up instructions, medication list, d/c activities limitations if indicated, with other d/c instructions as indicated by MD - patient able to verbalize understanding, all questions fully answered.   Patient instructed to return to ED, call 911, or call MD for any changes in condition.   Patient escorted to the main entarnce, and D/C home via taxi.  Glennie Bose O Mckinzie Saksa 07/22/2022 1:19 PM

## 2022-07-22 NOTE — Discharge Summary (Addendum)
Physician Discharge Summary Note  Patient:  Danny Cook. is an 62 y.o., male MRN:  [id number] DOB:  01-04-1961 Patient phone:  [phone] (home)  Patient address:   [address] [place] [address],  Total Time spent with patient: 15 minutes  Date of Admission:  07/13/2022 Date of Discharge: 07-22-2022  Reason for Admission:     [name]. is a 62 y.o., male with a past psychiatric history significant for MDD with psychotic features who presents to the [place] from  [facility] Emergency Department for evaluation and management of SI with plan to overdose on medications.    Principal Problem: Severe episode of recurrent major depressive disorder, without psychotic features [facility]) Discharge Diagnoses: Principal Problem:   Severe episode of recurrent major depressive disorder, without psychotic features ([place]) Active Problems:   Chronic diastolic heart failure (HCC)   HTN (hypertension)   Diabetes ([place])   HIV positive ([place])   Generalized anxiety disorder   Past Psychiatric History:  Previous Psych Diagnoses: MDD with psychotic features and his last admission in 03/2022, previously diagnosed with schizophrenia in his 65s Prior psychiatric treatment: Effexor 75 mg, hydroxyzine 25 mg 3 times daily as needed Psychiatric medication compliance history: poor   Current psychiatric treatment: Valium 5 mg 3 times daily, Depakote ER 500 mg nightly, gabapentin 800 mg 3 times daily, iloperidone 12 mg twice daily, Remeron 45 mg nightly, Geodon 40 mg twice daily with meals Current psychiatrist: Dr. Rigoberto Noel Current therapist: sees a therapist in [facility]   Previous hospitalizations once from 04/15/2022-04/23/2022 for SI and worsening depression for months History of suicide attempts: overdose on pills when he was 62 years old due to family stressors History of self harm: denies     Past Medical History:  Past Medical History:   Diagnosis Date   Anemia    Anxiety    Arthritis    CHF (congestive heart failure) ([place])    Chronic kidney disease    Renal Insufficiency Syndrome; Glomerulosclerosis 2013   Depressed    Diabetes mellitus without complication (HCC)    GERD (gastroesophageal reflux disease)    Headache    High cholesterol    History of kidney stones    HIV (human immunodeficiency virus infection) ([place])    Hypertension    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[place])    Myocardial infarction ([place])    Paranoid disorder ([place])    Pneumonia    Schizophrenia, paranoid ([place])    Sleep apnea    Stroke ([place])    mini TIA   TIA (transient ischemic attack)     Past Surgical History:  Procedure Laterality Date   CARDIAC CATHETERIZATION     CATARACT EXTRACTION W/PHACO Right 04/25/2021   Procedure: CATARACT EXTRACTION PHACO AND INTRAOCULAR LENS PLACEMENT ([place]) RIGHT DIABETIC;  Surgeon: Birder Robson, MD;  Location: [facility] ORS;  Service: Ophthalmology;  Laterality: Right;  3.32 0:23.0   CATARACT EXTRACTION W/PHACO Left 09/19/2021   Procedure: CATARACT EXTRACTION PHACO AND INTRAOCULAR LENS PLACEMENT ([place]) LEFT DIABETIC;  Surgeon: Birder Robson, MD;  Location: [facility] ORS;  Service: Ophthalmology;  Laterality: Left;  6.73 0:49.1   COLONOSCOPY WITH PROPOFOL N/A 09/03/2015   Procedure: COLONOSCOPY WITH PROPOFOL;  Surgeon: Lollie Sails, MD;  [facility] ENDOSCOPY;  Service: Endoscopy;  Laterality: N/A;   COLONOSCOPY WITH PROPOFOL N/A 09/17/2020   Procedure: COLONOSCOPY WITH PROPOFOL;  Surgeon: Lesly Rubenstein, MD;  Location: [facility] ENDOSCOPY;  Service: Endoscopy;  Laterality: N/A;  PREFERS 9AM  OR LATER DUE TO TRANSPORTATION   DG TEETH FULL     ESOPHAGOGASTRODUODENOSCOPY (EGD) WITH PROPOFOL N/A 07/01/2016   Dr. [name], [facility]. abnormal esophageal motility, suspicious for presbyesophagus, bile gastritis s/p biopsy, non-bleeding erosive gastropathy s/p biopsy, normal duodenum, path with negative H.pylori and +chronic active  gastritis   ESOPHAGOGASTRODUODENOSCOPY (EGD) WITH PROPOFOL N/A 09/17/2020   Procedure: ESOPHAGOGASTRODUODENOSCOPY (EGD) WITH PROPOFOL;  Surgeon: Lesly Rubenstein, MD;  Location: [facility] ENDOSCOPY;  Service: Endoscopy;  Laterality: N/A;   EXTRACORPOREAL SHOCK WAVE LITHOTRIPSY Right 11/01/2020   Procedure: EXTRACORPOREAL SHOCK WAVE LITHOTRIPSY (ESWL);  Surgeon: Abbie Sons, MD;  Location: [facility] ORS;  Service: Urology;  Laterality: Right;   LUMBAR LAMINECTOMY/DECOMPRESSION MICRODISCECTOMY N/A 07/15/2021   Procedure: L3-5 DECOMPRESSION;  Surgeon: Meade Maw, MD;  Location: [facility] ORS;  Service: Neurosurgery;  Laterality: N/A;   Family History:  Family History  Problem Relation Age of Onset   Heart attack Mother    Diabetes Mellitus II Mother    Mental illness Mother    CAD Mother    Heart attack Father    CAD Father    Hypertension Father    Family Psychiatric  History:  Psych: Mother- MDD, anxiety Suicide: Mother attempted suicide Substance use family hx: Denies    Social History:  Social History   Substance and Sexual Activity  Alcohol Use No     Social History   Substance and Sexual Activity  Drug Use No   Comment: denies street drugs    Social History   Socioeconomic History   Marital status: Single    Spouse name: Not on file   Number of children: Not on file   Years of education: 11   Highest education level: GED or equivalent  Occupational History   Not on file  Tobacco Use   Smoking status: Never   Smokeless tobacco: Never  Vaping Use   Vaping Use: Never used  Substance and Sexual Activity   Alcohol use: No   Drug use: No    Comment: denies street drugs   Sexual activity: Never  Other Topics Concern   Not on file  Social History Narrative   Not on file   Social Determinants of Health   Financial Resource Strain: Low Risk  (08/12/2017)   Overall Financial Resource Strain (CARDIA)    Difficulty of Paying Living Expenses: Not hard at all  Food  Insecurity: No Food Insecurity (07/13/2022)   Hunger Vital Sign    Worried About [place] in the Last Year: Never true    [place] in the Last Year: Never true  Recent Concern: Food Insecurity - Food Insecurity Present (04/15/2022)   Hunger Vital Sign    Worried About [place] in the Last Year: Sometimes true    Ran Out of Food in the Last Year: Sometimes true  Transportation Needs: No Transportation Needs (07/13/2022)   PRAPARE - Hydrologist (Medical): No    Lack of Transportation (Non-Medical): No  Recent Concern: Transportation Needs - Unmet Transportation Needs (04/15/2022)   PRAPARE - Transportation    Lack of Transportation (Medical): Yes    Lack of Transportation (Non-Medical): Yes  Physical Activity: Inactive (08/12/2017)   Exercise Vital Sign    Days of Exercise per Week: 0 days    Minutes of Exercise per Session: 0 min  Stress: Stress Concern Present (08/12/2017)   [place]  Feeling of Stress : To some extent  Social Connections: Moderately Isolated (08/12/2017)   Social Connection and Isolation Panel [NHANES]    Frequency of Communication with Friends and Family: More than three times a week    Frequency of Social Gatherings with Friends and Family: Three times a week    Attends Religious Services: Never    Active Member of Clubs or Organizations: No    Attends Archivist Meetings: Never    Marital Status: Never married    Hospital Course:     During the patient's hospitalization, patient had extensive initial psychiatric evaluation, and follow-up psychiatric evaluations every day.   Psychiatric diagnoses provided upon initial assessment:  MDD (major depressive disorder), recurrent severe, without psychosis ([place])   Chronic diastolic heart failure (HCC)   HTN (hypertension)   Diabetes (HCC)   HIV positive (HCC)   Generalized anxiety  disorder Dependent personality d/o    Patient's psychiatric medications were adjusted on admission:  -Restart home  Effexor-XR 75 mg daily with breakfast for depressive symptoms and anxiety -Decrease Remeron from 45 mg to 30 mg qHS for insomnia, poor appetite and MDD  -Decrease Geodon from 40 mg bid to 20 mg BID with meals for 4 doses -Start Fanapt taper (home dose of 12 mg BID): 8 mg q12H for 3 doses -> 6 mg q12H on 1/17 -> 4 mg q12H on 1/18 -> 2 mg q12H on 1/19 -Decrease Neurontin from 800 mg to 600 mg q8H for neuropathy and anxiety -Continue home Valium 5 mg q8H for anxiety -STOP home Cogentin, Arecept, and Namenda due to patient not taking/filling -STOP home Depakote due to polypharmacy   During the hospitalization, other adjustments were made to the patient's psychiatric medication regimen:  Fanapt taper paused at 4 mg bid - this is effective dose for treating paranoia (psychotic symptom of MDD)   -Valium tapered from 5 mg tid to 2 mg tid    Patient's care was discussed during the interdisciplinary team meeting every day during the hospitalization.   The patient denied having side effects to prescribed psychiatric medication.   Gradually, patient started adjusting to milieu. The patient was evaluated each day by a clinical provider to ascertain response to treatment. Improvement was noted by the patient's report of decreasing symptoms, improved sleep and appetite, affect, medication tolerance, behavior, and participation in unit programming.  Patient was asked each day to complete a self inventory noting mood, mental status, pain, new symptoms, anxiety and concerns.     Symptoms were reported as significantly decreased or resolved completely by discharge.    On day of discharge, the patient reports that their mood is stable. The patient denied having suicidal thoughts for more than 48 hours prior to discharge.  Patient denies having homicidal thoughts.  Patient denies having auditory  hallucinations.  Patient denies any visual hallucinations or other symptoms of psychosis. The patient was motivated to continue taking medication with a goal of continued improvement in mental health.    The patient reports their target psychiatric symptoms of depression and suicidal thoughts, all responded well to the psychiatric medications, and the patient reports overall benefit other psychiatric hospitalization. Supportive psychotherapy was provided to the patient. The patient also participated in regular group therapy while hospitalized. Coping skills, problem solving as well as relaxation therapies were also part of the unit programming.   Labs were reviewed with the patient, and abnormal results were discussed with the patient.   The patient is able to verbalize  their individual safety plan to this provider.   # It is recommended to the patient to continue psychiatric medications as prescribed, after discharge from the hospital.     # It is recommended to the patient to follow up with your outpatient psychiatric provider and PCP.   # It was discussed with the patient, the impact of alcohol, drugs, tobacco have been there overall psychiatric and medical wellbeing, and total abstinence from substance use was recommended the patient.ed.   # Prescriptions provided or sent directly to preferred pharmacy at discharge. Patient agreeable to plan. Given opportunity to ask questions. Appears to feel comfortable with discharge.    # In the event of worsening symptoms, the patient is instructed to call the crisis hotline, 911 and or go to the nearest ED for appropriate evaluation and treatment of symptoms. To follow-up with primary care provider for other medical issues, concerns and or health care needs   # Patient was discharged to home, with a plan to follow up as noted below.         Physical Findings: AIMS: Facial and Oral Movements Muscles of Facial Expression: None, normal Lips and  Perioral Area: None, normal Jaw: None, normal Tongue: None, normal,Extremity Movements Upper (arms, wrists, hands, fingers): None, normal Lower (legs, knees, ankles, toes): None, normal, Trunk Movements Neck, shoulders, hips: None, normal, Overall Severity Severity of abnormal movements (highest score from questions above): None, normal Incapacitation due to abnormal movements: None, normal Patient's awareness of abnormal movements (rate only patient's report): No Awareness, Dental Status Current problems with teeth and/or dentures?: No Does patient usually wear dentures?: No  CIWA:    COWS:     Musculoskeletal: Strength & Muscle Tone: within normal limits Gait & Station: normal Patient leans: N/A  Some RUE cogwheeling, mild. This is improved since admission.   Psychiatric Specialty Exam:  Presentation  General Appearance:  Casual; Fairly Groomed; Appropriate for Environment  Eye Contact: Good  Speech: Normal Rate; Clear and Coherent  Speech Volume: Normal  Handedness: Right   Mood and Affect  Mood: Euthymic  Affect: Appropriate; Congruent; Full Range   Thought Process  Thought Processes: Linear  Descriptions of Associations:Intact  Orientation:Full (Time, Place and Person)  Thought Content:Logical  History of Schizophrenia/Schizoaffective disorder:No  Duration of Psychotic Symptoms:No data recorded Hallucinations:Hallucinations: None  Ideas of Reference:None  Suicidal Thoughts:Suicidal Thoughts: No  Homicidal Thoughts:Homicidal Thoughts: No   Sensorium  Memory: Immediate Good; Recent Good; Remote Good  Judgment: Fair  Insight: Fair   Materials engineer: Fair  Attention Span: Fair  Recall: [employer] of Knowledge: Fair  Language: Fair   Psychomotor Activity  Psychomotor Activity: Psychomotor Activity: Normal   Assets  Assets: Desire for Improvement; Housing; Catering manager;  Communication Skills   Sleep  Sleep: Sleep: Fair    Physical Exam: Physical Exam Vitals reviewed.  Constitutional:      General: He is not in acute distress.    Appearance: He is normal weight. He is not toxic-appearing.  Neurological:     Mental Status: He is alert.     Motor: No weakness.     Gait: Gait normal.  Psychiatric:        Mood and Affect: Mood normal.        Behavior: Behavior normal.        Thought Content: Thought content normal.        Judgment: Judgment normal.    Review of Systems  Neurological:  Negative for dizziness, tingling,  tremors and headaches.  Psychiatric/Behavioral:  Negative for depression, hallucinations, memory loss, substance abuse and suicidal ideas. The patient is not nervous/anxious and does not have insomnia.    Blood pressure 110/76, pulse 94, temperature 97.8 F (36.6 C), temperature source Oral, resp. rate 16, height '5\' 7"'$  (1.702 m), weight 87.5 kg, SpO2 100 %. Body mass index is 30.23 kg/m.   Social History   Tobacco Use  Smoking Status Never  Smokeless Tobacco Never   Tobacco Cessation:  N/A, patient does not currently use tobacco products   Blood Alcohol level:  Lab Results  Component Value Date   ETH <10 07/12/2022   ETH <10 16/03/9603    Metabolic Disorder Labs:  Lab Results  Component Value Date   HGBA1C 5.8 (H) 04/15/2022   MPG 119.76 04/15/2022   MPG 134.11 07/31/2020   No results found for: "PROLACTIN" Lab Results  Component Value Date   CHOL 154 04/15/2022   TRIG 259 (H) 04/15/2022   HDL 27 (L) 04/15/2022   CHOLHDL 5.7 04/15/2022   VLDL 52 (H) 04/15/2022   LDLCALC 75 04/15/2022   LDLCALC 106 (H) 11/05/2017    See Psychiatric Specialty Exam and Suicide Risk Assessment completed by Attending Physician prior to discharge.  Discharge destination:  Home  Is patient on multiple antipsychotic therapies at discharge:  No   Has Patient had three or more failed trials of antipsychotic monotherapy by  history:  No  Recommended Plan for Multiple Antipsychotic Therapies: NA  Discharge Instructions     Diet - low sodium heart healthy   Complete by: As directed    Increase activity slowly   Complete by: As directed       Allergies as of 07/22/2022       Reactions   Trazodone And Nefazodone    Affects other meds that they dont work   V[employer]ad Visual merchandiser Disoproxil]    Damages kidneys   Etodolac Rash        Medication List     STOP taking these medications    benztropine 0.5 MG tablet Commonly known as: COGENTIN   dicyclomine 10 MG capsule Commonly known as: BENTYL   divalproex 500 MG 24 hr tablet Commonly known as: DEPAKOTE ER   donepezil 10 MG tablet Commonly known as: ARICEPT   Fish Oil 1000 MG Caps   glycopyrrolate 2 MG tablet Commonly known as: ROBINUL   memantine 10 MG tablet Commonly known as: NAMENDA   methocarbamol 500 MG tablet Commonly known as: Robaxin   sucralfate 1 g tablet Commonly known as: CARAFATE   Tresiba FlexTouch 200 UNIT/ML FlexTouch Pen Generic drug: insulin degludec   ziprasidone 40 MG capsule Commonly known as: GEODON       TAKE these medications      Indication  albuterol 108 (90 Base) MCG/ACT inhaler Commonly known as: VENTOLIN HFA Inhale 2 puffs into the lungs every 6 (six) hours as needed for wheezing or shortness of breath.  Indication: Asthma   aspirin EC 325 MG tablet Take 325 mg by mouth daily.  Indication: Disease involving Lipid Deposits in the Arteries   brimonidine-timolol 0.2-0.5 % ophthalmic solution Commonly known as: COMBIGAN Place 1 drop into both eyes every 12 (twelve) hours.  Indication: Persistently Increased Pressure in the Eye   diazepam 2 MG tablet Commonly known as: VALIUM Take 1 tablet (2 mg total) by mouth every 8 (eight) hours for 7 days. What changed:  medication strength how much to take when to take this  Indication: Feeling Anxious   docusate sodium 250 MG capsule Commonly  known as: COLACE Take 250 mg by mouth 2 (two) times daily.  Indication: Constipation   Fluocinolone Acetonide 0.01 % Oil Place 2-4 drops in ear(s) every other day. At bedtime for dry skin  Indication: Chronic External Ear Eczema   fluticasone 220 MCG/ACT inhaler Commonly known as: Flovent HFA Inhale 2 puffs into the lungs 2 (two) times daily. Rinse out mouth afterwards  Indication: Asthma   gabapentin 300 MG capsule Commonly known as: NEURONTIN Take 2 capsules (600 mg total) by mouth every 8 (eight) hours. What changed:  medication strength how much to take when to take this  Indication: Generalized Anxiety Disorder   gemfibrozil 600 MG tablet Commonly known as: LOPID Take 600 mg by mouth 2 (two) times daily before a meal.  Indication: High Amount of Triglycerides in the Blood   glimepiride 4 MG tablet Commonly known as: AMARYL Take 4 mg by mouth daily with breakfast.  Indication: Type 2 Diabetes   hydrOXYzine 25 MG tablet Commonly known as: ATARAX Take 1 tablet (25 mg total) by mouth 3 (three) times daily as needed for anxiety.  Indication: Feeling Anxious   iloperidone 4 MG Tabs tablet Commonly known as: FANAPT Take 1 tablet (4 mg total) by mouth every 12 (twelve) hours. What changed:  medication strength how much to take when to take this  Indication: psychotic symptoms of depression   losartan 50 MG tablet Commonly known as: COZAAR Take 50 mg by mouth daily.  Indication: High Blood Pressure Disorder   meclizine 25 MG tablet Commonly known as: ANTIVERT Take 1 tablet (25 mg total) by mouth 3 (three) times daily as needed for dizziness.  Indication: Dizzy   metFORMIN 1000 MG tablet Commonly known as: GLUCOPHAGE Take 1,000 mg by mouth 2 (two) times daily with a meal.  Indication: Antipsychotic Therapy-Induced Weight Gain, Type 2 Diabetes   mirtazapine 30 MG tablet Commonly known as: REMERON Take 1 tablet (30 mg total) by mouth at bedtime. What changed:   medication strength how much to take  Indication: Major Depressive Disorder   mometasone 0.1 % ointment Commonly known as: ELOCON Apply 1 application topically See admin instructions. Apply 4 drops to ear canal at bedtime every other night for itching and dry skin.  Indication: Itching   nitroGLYCERIN 0.4 MG SL tablet Commonly known as: NITROSTAT Place 0.4 mg under the tongue every 5 (five) minutes as needed for chest pain.  Indication: Acute Angina Pectoris   omeprazole 40 MG capsule Commonly known as: PRILOSEC Take 40 mg by mouth 2 (two) times daily.  Indication: Gastroesophageal Reflux Disease   Ozempic (1 MG/DOSE) 4 MG/3ML Sopn Generic drug: Semaglutide (1 MG/DOSE) Inject 2 mg into the skin every Monday.  Indication: Type 2 Diabetes   rosuvastatin 40 MG tablet Commonly known as: CRESTOR Take 40 mg by mouth daily.  Indication: High Amount of Fats in the Blood   senna 8.6 MG Tabs tablet Commonly known as: SENOKOT Take 1 tablet by mouth daily.  Indication: Constipation   tamsulosin 0.4 MG Caps capsule Commonly known as: FLOMAX Take 0.4 mg by mouth daily.  Indication: Benign Enlargement of Prostate   Triumeq 600-50-300 MG tablet Generic drug: abacavir-dolutegravir-lamiVUDine Take 1 tablet by mouth at bedtime.  Indication: HIV Disease   venlafaxine XR 75 MG 24 hr capsule Commonly known as: EFFEXOR-XR Take 1 capsule (75 mg total) by mouth daily with breakfast. Start taking on: July 23, 2022 What  changed:  medication strength how much to take  Indication: Major Depressive Disorder        Follow-up Information     [facility], [facility] on 07/24/2022.   Why: You have an appointment for medication management services on 07/24/22 at 9:20 am. This appointment will be held in person. Contact information: [place] [address] [place]. Call.   Why: Please call to schedule an appointment for  therapy services with Pam.  Please continue with this provider for peer support services. Contact information: [place]  [address] [phone]         [facility], [facility]. Call.   Why: We have made a referral to their program, [facility].  Please follow up with this provider for ongoing services.  Your support team is aware of this referral and have agreed to assist you with following up.  Updated phone number is [phone] and updated fax number is: [phone]. Contact information: [address] [place] [address] [phone]                 Follow-up recommendations:    Activity: as tolerated   Diet: heart healthy   Other: -Follow-up with your outpatient psychiatric provider -instructions on appointment date, time, and address (location) are provided to you in discharge paperwork.   -Take your psychiatric medications as prescribed at discharge - instructions are provided to you in the discharge paperwork   -Follow-up with outpatient primary care doctor and other specialists -for management of preventative medicine and chronic medical disease   -Recommend abstinence from alcohol, tobacco, and other illicit drug use at discharge.    -If your psychiatric symptoms recur, worsen, or if you have side effects to your psychiatric medications, call your outpatient psychiatric provider, 911, [phone] or go to the nearest emergency department.   -If suicidal thoughts recur, call your outpatient psychiatric provider, 911, [phone] or go to the nearest emergency department.    - At discharge, we recommend: psychosocial rehab, [facility] or to go to [facility].   Signed: Christoper Allegra, MD 07/22/2022, 10:26 AM   Total Time Spent in Direct Patient Care:  I personally spent 35 minutes on the unit in direct patient care. The direct patient care time included face-to-face time with the patient, reviewing the patient's chart,  communicating with other professionals, and coordinating care. Greater than 50% of this time was spent in counseling or coordinating care with the patient regarding goals of hospitalization, psycho-education, and discharge planning needs.   Janine Limbo, MD Psychiatrist

## 2022-07-22 NOTE — Group Note (Signed)
LCSW Group Therapy Note   Group Date: 07/22/2022 Start Time: 1100 End Time: 1200  Type of Therapy and Topic: Group Therapy: Effective Communication   Participation Level: Did not attend    Description of Group:  In this group patients will be asked to identify their own styles of communication as well as defining and identifying passive, assertive, and aggressive styles of communication. Participants will identify strategies to communicate in a more assertive manner in an effort to appropriately meet their needs. This group will be process-oriented, with patients participating in exploration of their own experiences as well as giving and receiving support and challenge from other group members.   Therapeutic Goals: 1. Patient will identify their personal communication style. 2. Patient will identify passive, assertive, and aggressive forms of communication. 3. Patient will identify strategies for developing more effective communication to appropriately meet their needs.    Summary of Patient Progress: Did not attend     Therapeutic Modalities:  Communication Skills Solution Focused Therapy Motivational Interviewing  Darleen Crocker, [place] 07/22/2022  12:58 PM

## 2022-07-22 NOTE — Plan of Care (Signed)
  Problem: Activity: ?Goal: Interest or engagement in activities will improve ?Outcome: Progressing ?Goal: Sleeping patterns will improve ?Outcome: Progressing ?  ?Problem: Coping: ?Goal: Ability to verbalize frustrations and anger appropriately will improve ?Outcome: Progressing ?Goal: Ability to demonstrate self-control will improve ?Outcome: Progressing ?  ?

## 2022-07-22 NOTE — Plan of Care (Signed)
  Problem: Education: Goal: Knowledge of [facility] Education information/materials will improve 07/22/2022 1022 by Dorris Carnes, RN Outcome: Completed/Met 07/22/2022 1018 by Dorris Carnes, RN Outcome: Progressing Goal: Emotional status will improve 07/22/2022 1022 by Dorris Carnes, RN Outcome: Completed/Met 07/22/2022 1018 by Dorris Carnes, RN Outcome: Progressing Goal: Mental status will improve 07/22/2022 1022 by Dorris Carnes, RN Outcome: Completed/Met 07/22/2022 1018 by Dorris Carnes, RN Outcome: Progressing Goal: Verbalization of understanding the information provided will improve 07/22/2022 1022 by Dorris Carnes, RN Outcome: Completed/Met 07/22/2022 1018 by Dorris Carnes, RN Outcome: Progressing   Problem: Activity: Goal: Interest or engagement in activities will improve 07/22/2022 1022 by Dorris Carnes, RN Outcome: Completed/Met 07/22/2022 1018 by Dorris Carnes, RN Outcome: Progressing Goal: Sleeping patterns will improve 07/22/2022 1022 by Dorris Carnes, RN Outcome: Completed/Met 07/22/2022 1018 by Dorris Carnes, RN Outcome: Progressing   Problem: Education: Goal: Emotional status will improve 07/22/2022 1022 by Dorris Carnes, RN Outcome: Completed/Met 07/22/2022 1018 by Dorris Carnes, RN Outcome: Progressing

## 2022-07-22 NOTE — Progress Notes (Signed)
[facility] LCSW Progress Note   07/22/2022    9:52 AM  Danny Cook Danny Cook.   [id number]   Type of Contact and Topic:  Medicare Notice  Patient informed of right to appeal discharge, provided phone number to [facility]. Patient expressed no interest in appealing discharge at this time. [name] will continue to monitor situation.    Signed:  Riki Altes MSW, LCSW, LCAS 07/22/2022 9:52 AM

## 2022-07-22 NOTE — Progress Notes (Signed)
  [facility] Adult Case Management Discharge Plan :  Will you be returning to the same living situation after discharge:  Yes,  home At discharge, do you have transportation home?: Yes,  [name] assists patient with taxi Do you have the ability to pay for your medications: Yes,  insurance, Medicare  Release of information consent forms completed and in the chart;  Patient's signature needed at discharge.  Patient to Follow up at:  Follow-up Information     [facility] [place] on 07/24/2022.   Why: You have an appointment for medication management services on 07/24/22 at 9:20 am. This appointment will be held in person. Contact information: [place] [address] [place]. Call.   Why: Please call to schedule an appointment for therapy services with Pam.  Please continue with this provider for peer support services. Contact information: [place] [address] [phone]         [facility]. Call.   Why: We have made a referral to their program, [facility].  Please follow up with this provider for ongoing services.  Your support team is aware of this referral and have agreed to assist you with following up.  Updated phone number is [phone] and updated fax number is: [phone]. Contact information: [address] [place]  [address] [phone]                 Next level of care provider has access to [place] and Suicide Prevention discussed: Yes,  peer support, Nicole Kindred     Has patient been referred to the Quitline?: N/A patient is not a smoker  Patient has been referred for addiction treatment: [place], LCSW 07/22/2022, 9:50 AM

## 2022-07-22 NOTE — Group Note (Signed)
Occupational Therapy Group Note   Group Topic:Goal Setting  Group Date: 07/21/2022 Start Time: 1430 End Time: 1510 Facilitators: Brantley Stage, OT   Group Description: Group encouraged engagement and participation through discussion focused on goal setting. Group members were introduced to goal-setting using the SMART Goal framework, identifying goals as Specific, Measureable, Acheivable, Relevant, and Time-Bound. Group members took time from group to create their own personal goal reflecting the SMART goal template and shared for review by peers and OT.    Therapeutic Goal(s):  Identify at least one goal that fits the SMART framework    Participation Level: Active   Participation Quality: Independent   Behavior: Appropriate   Speech/Thought Process: Directed   Affect/Mood: Appropriate   Insight: Fair   Judgement: Fair   Individualization: pt was engaged in their participation of group discussion/activity. New skills identified  Modes of Intervention: Discussion  Patient Response to Interventions:  Attentive   Plan: Continue to engage patient in OT groups 1x/week.  07/22/2022  Brantley Stage, OT Cornell Barman, OT

## 2022-07-24 ENCOUNTER — Other Ambulatory Visit: Payer: Self-pay | Admitting: Infectious Diseases

## 2022-07-24 DIAGNOSIS — R911 Solitary pulmonary nodule: Secondary | ICD-10-CM

## 2022-08-06 ENCOUNTER — Ambulatory Visit
Admission: RE | Admit: 2022-08-06 | Discharge: 2022-08-06 | Disposition: A | Discharge status: Home / Self Care | Payer: 59 | Source: Ambulatory Visit | Attending: Infectious Diseases | Admitting: Infectious Diseases

## 2022-08-06 DIAGNOSIS — R911 Solitary pulmonary nodule: Secondary | ICD-10-CM | POA: Insufficient documentation

## 2022-09-25 ENCOUNTER — Other Ambulatory Visit: Payer: Self-pay

## 2022-09-25 ENCOUNTER — Emergency Department
Admission: EM | Admit: 2022-09-25 | Discharge: 2022-09-26 | Disposition: A | Discharge status: Home / Self Care | Payer: 59 | Attending: Student in an Organized Health Care Education/Training Program | Admitting: Student in an Organized Health Care Education/Training Program

## 2022-09-25 ENCOUNTER — Emergency Department: Payer: 59

## 2022-09-25 ENCOUNTER — Encounter: Payer: Self-pay | Admitting: Intensive Care

## 2022-09-25 ENCOUNTER — Telehealth: Payer: Self-pay

## 2022-09-25 DIAGNOSIS — U071 COVID-19: Secondary | ICD-10-CM | POA: Diagnosis not present

## 2022-09-25 DIAGNOSIS — E860 Dehydration: Secondary | ICD-10-CM | POA: Insufficient documentation

## 2022-09-25 DIAGNOSIS — R42 Dizziness and giddiness: Secondary | ICD-10-CM | POA: Diagnosis present

## 2022-09-25 LAB — BASIC METABOLIC PANEL
Anion gap: 11 (ref 5–15)
BUN: 22 mg/dL (ref 8–23)
CO2: 25 mmol/L (ref 22–32)
Calcium: 8.8 mg/dL — ABNORMAL LOW (ref 8.9–10.3)
Chloride: 101 mmol/L (ref 98–111)
Creatinine, Ser: 1.44 mg/dL — ABNORMAL HIGH (ref 0.61–1.24)
GFR, Estimated: 55 mL/min — ABNORMAL LOW (ref >60)
Glucose, Bld: 150 mg/dL — ABNORMAL HIGH (ref 70–99)
Potassium: 3.9 mmol/L (ref 3.5–5.1)
Sodium: 137 mmol/L (ref 135–145)

## 2022-09-25 LAB — RESP PANEL BY RT-PCR (RSV, FLU A&B, COVID)  RVPGX2
Influenza A by PCR: NEGATIVE
Influenza B by PCR: NEGATIVE
Resp Syncytial Virus by PCR: NEGATIVE
SARS Coronavirus 2 by RT PCR: POSITIVE — AB

## 2022-09-25 LAB — CBC
HCT: 35.3 % — ABNORMAL LOW (ref 39.0–52.0)
Hemoglobin: 12 g/dL — ABNORMAL LOW (ref 13.0–17.0)
MCH: 31.8 pg (ref 26.0–34.0)
MCHC: 34 g/dL (ref 30.0–36.0)
MCV: 93.6 fL (ref 80.0–100.0)
Platelets: 185 10*3/uL (ref 150–400)
RBC: 3.77 MIL/uL — ABNORMAL LOW (ref 4.22–5.81)
RDW: 13.5 % (ref 11.5–15.5)
WBC: 5.8 10*3/uL (ref 4.0–10.5)
nRBC: 0 % (ref 0.0–0.2)

## 2022-09-25 LAB — URINALYSIS, ROUTINE W REFLEX MICROSCOPIC
Bilirubin Urine: NEGATIVE
Glucose, UA: NEGATIVE mg/dL
Hgb urine dipstick: NEGATIVE
Ketones, ur: NEGATIVE mg/dL
Leukocytes,Ua: NEGATIVE
Nitrite: NEGATIVE
Protein, ur: 100 mg/dL — AB
Specific Gravity, Urine: 1.008 (ref 1.005–1.030)
Squamous Epithelial / HPF: NONE SEEN /HPF (ref 0–5)
pH: 5 (ref 5.0–8.0)

## 2022-09-25 LAB — TROPONIN I (HIGH SENSITIVITY): Troponin I (High Sensitivity): 7 ng/L (ref <18)

## 2022-09-25 MED ORDER — NIRMATRELVIR/RITONAVIR (PAXLOVID) TABLET (RENAL DOSING): 2.0000 | ORAL_TABLET | Freq: Two times a day (BID) | ORAL | 0 refills | Status: DC

## 2022-09-25 MED ORDER — NIRMATRELVIR/RITONAVIR (PAXLOVID) TABLET (RENAL DOSING): 2.0000 | ORAL_TABLET | Freq: Two times a day (BID) | ORAL | 0 refills | Status: AC

## 2022-09-25 MED ORDER — SODIUM CHLORIDE 0.9 % IV BOLUS
1000.0000 mL | Freq: Once | INTRAVENOUS | Status: AC
  Administered 2022-09-25: 1000 mL via INTRAVENOUS

## 2022-09-25 MED ORDER — ACETAMINOPHEN 500 MG PO TABS
1000.0000 mg | ORAL_TABLET | Freq: Once | ORAL | Status: AC
  Administered 2022-09-25: 1000 mg via ORAL
  Filled 2022-09-25: qty 2

## 2022-09-25 NOTE — ED Triage Notes (Addendum)
Patient c/o weakness, dizziness, and lightheadedness that started this AM. Reports generalized body aches. Patient appears to have cough, cold and chills in triage

## 2022-09-25 NOTE — Telephone Encounter (Signed)
Pt called to let Lysle Morales  know that last week he went from 196 lb to 190 lb.  Pt stated that this week,  his weight has been stable at 190 lb. And stated that he has been purposely trying to loose weight.   Will route to Landry Corporal, FNP for FYI.

## 2022-09-25 NOTE — ED Notes (Signed)
First nurse note: Pt here for dizziness and weakness from home. Pt here via [facility].   CBG 120 98.3 HR:60 96% 133/92

## 2022-09-25 NOTE — ED Provider Notes (Signed)
[facility] Provider Note    Event Date/Time   First MD Initiated Contact with Patient 09/25/22 1837     (approximate)   History   Dizziness   HPI  Danny Cook. is a 62 y.o. male who presents to the ER for generalized malaise fatigue lightheadedness and some cough.  Has had some chills but no measured fever.  Denies any abdominal pain no dysuria.  No chest pain no shortness of breath.  No numbness or tingling.  No known sick contacts no recent antibiotics.  No new medications.     Physical Exam   Triage Vital Signs: ED Triage Vitals  Enc Vitals Group     BP 09/25/22 1731 126/69     Pulse Rate 09/25/22 1731 69     Resp 09/25/22 1731 16     Temp 09/25/22 1731 98.4 F (36.9 C)     Temp Source 09/25/22 1731 Oral     SpO2 09/25/22 1731 98 %     Weight 09/25/22 1732 190 lb (86.2 kg)     Height 09/25/22 1732 5\' 7"  (1.702 m)     Head Circumference --      Peak Flow --      Pain Score 09/25/22 1732 0     Pain Loc --      Pain Edu? --      Excl. in [place]? --     Most recent vital signs: Vitals:   09/25/22 2200 09/25/22 2230  BP: 121/68   Pulse: 94   Resp: 18   Temp:  100 F (37.8 C)  SpO2: 93%      Constitutional: Alert  Eyes: Conjunctivae are normal.  Head: Atraumatic. Nose: No congestion/rhinnorhea. Mouth/Throat: Mucous membranes are moist.   Neck: Painless ROM.  Cardiovascular:   Good peripheral circulation. Respiratory: Normal respiratory effort.  No retractions.  Gastrointestinal: Soft and nontender.  Musculoskeletal:  no deformity Neurologic:  MAE spontaneously. No gross focal neurologic deficits are appreciated.  Skin:  Skin is warm, dry and intact. No rash noted. Psychiatric: Mood and affect are normal. Speech and behavior are normal.    ED Results / Procedures / Treatments   Labs (all labs ordered are listed, but only abnormal results are displayed) Labs Reviewed  RESP PANEL BY RT-PCR (RSV, FLU A&B, COVID)   RVPGX2 - Abnormal; Notable for the following components:      Result Value   SARS Coronavirus 2 by RT PCR POSITIVE (*)    All other components within normal limits  BASIC METABOLIC PANEL - Abnormal; Notable for the following components:   Glucose, Bld 150 (*)    Creatinine, Ser 1.44 (*)    Calcium 8.8 (*)    GFR, Estimated 55 (*)    All other components within normal limits  CBC - Abnormal; Notable for the following components:   RBC 3.77 (*)    Hemoglobin 12.0 (*)    HCT 35.3 (*)    All other components within normal limits  URINALYSIS, ROUTINE W REFLEX MICROSCOPIC - Abnormal; Notable for the following components:   Color, Urine STRAW (*)    APPearance CLEAR (*)    Protein, ur 100 (*)    Bacteria, UA RARE (*)    All other components within normal limits  TROPONIN I (HIGH SENSITIVITY)  TROPONIN I (HIGH SENSITIVITY)     EKG  ED ECG REPORT I, Merlyn Lot, the attending physician, personally viewed and interpreted this ECG.   Date: 09/25/2022  EKG Time: 17:37  Rate: 70  Rhythm: sinus  Axis: normal  Intervals: normal  ST&T Change: non specific t wave abnn    RADIOLOGY Please see ED Course for my review and interpretation.  I personally reviewed all radiographic images ordered to evaluate for the above acute complaints and reviewed radiology reports and findings.  These findings were personally discussed with the patient.  Please see medical record for radiology report.    PROCEDURES:  Critical Care performed: No  Procedures   MEDICATIONS ORDERED IN ED: Medications  sodium chloride 0.9 % bolus 1,000 mL (0 mLs Intravenous Stopped 09/25/22 2033)  acetaminophen (TYLENOL) tablet 1,000 mg (1,000 mg Oral Given 09/25/22 2022)     IMPRESSION / MDM / ASSESSMENT AND PLAN / ED COURSE  I reviewed the triage vital signs and the nursing notes.                              Differential diagnosis includes, but is not limited to, dehydration, electrolyte abnormality,  pneumonia, viral illness, UTI, anemia  Patient presenting to the ER for evaluation of symptoms as described above.  Based on symptoms, risk factors and considered above differential, this presenting complaint could reflect a potentially life-threatening illness therefore the patient will be placed on continuous pulse oximetry and telemetry for monitoring.  Laboratory evaluation will be sent to evaluate for the above complaints.      Clinical Course as of 09/25/22 2322  Thu Sep 25, 2022  2018 Is COVID-positive.  No hypoxia no orthostasis able to ambulate with steady gait.  Does have temperature now will give Tylenol.  Presentation is explained by acute COVID.  Will continue to observe to determine need for hospitalization versus outpatient follow-up. [PR]  2319 Patient reassessed.  Remains hemodynamically stable.  Fever defervesced saying.  No hypoxia.  Stable and appropriate for outpatient follow-up. [PR]    Clinical Course User Index [PR] Merlyn Lot, MD     FINAL CLINICAL IMPRESSION(S) / ED DIAGNOSES   Final diagnoses:  Dehydration  COVID-19     Rx / DC Orders   ED Discharge Orders          Ordered    nirmatrelvir/ritonavir, renal dosing, (PAXLOVID) 10 x 150 MG & 10 x 100MG  TABS  2 times daily,   Status:  Discontinued        09/25/22 2018    nirmatrelvir/ritonavir, renal dosing, (PAXLOVID) 10 x 150 MG & 10 x 100MG  TABS  2 times daily        09/25/22 2024             Note:  This document was prepared using Dragon voice recognition software and may include unintentional dictation errors.    Merlyn Lot, MD 09/25/22 2322

## 2022-09-26 NOTE — ED Notes (Signed)
Writer received message to give pts spouse, Danny Cook, a callback. Writer spoke with [name] and her concern for pts transport back to home. Per, [name], pt has been receiving medical transport to and from needed medical appointments/care due to her inability to be exposed to illness while actively receiving chemotherapy. Writer informed that pt could be placed on transport list for 9am due to him being ambulatory and not meeting criteria for current [facility] services. [name] agreeable with plan of care. Assigned RN made aware.

## 2022-10-08 ENCOUNTER — Encounter: Payer: Medicare Other | Admitting: Family

## 2022-10-18 NOTE — Progress Notes (Unsigned)
Patient ID: Danny Navy., male    DOB: 1961/02/21, 62 y.o.   MRN: [id number]  Primary cardiologist: Marcina Millard, MD (last seen 10/23; returns 06/24) PCP: Gayla Doss, MD (last seen 02/24)  HPI  Danny Cook is a 62 y/o male with a history of DM, hyperlipidemia, HTN, CKD, HIV, schizophrenia, obstructive sleep apnea, anxiety, osteoarthritis and chronic heart failure.   Echo 07/03/22: EF  65-70% with mild LVH & mild Danny. Echo 07/25/20: EF of 55-60% along with mild LVH. Echo 06/08/18: EF of 55-60%. Echo 03/25/18: EF of 55-60%. Echo 07/31/17: EF of 55-65%. Echo 05/11/17: EF of 55% along with mild AR/ TR/ Danny.  Was in the ED 09/25/22 due to dizziness/ fatigue thought to be due to dehydration. Found to be covid +. Was in the ED 05/18/22 due to dizziness for 2 days along with possible thrush on his tongue. IVF given. Head CT negative. Had a psych admission in October along with 3 other ED visits in October.     He presents today for a HF follow-up visit with a chief complaint of minimal fatigue with moderate exertion. Chronic in nature. Has associated chronic back pain and occasional light-headedness along with this. Denies difficulty sleeping, abdominal distention, palpitations, pedal edema, chest pain, SOB, cough or weight gain.   Has been trying to lose weight by watching what he eats along with portion sizes.   Continues to live at [facility] and says that he's enjoying living there. Gets his medications pill packed from his pharmacy so is having no problems taking them. Medications get delivered from his pharmacy  Past Medical History:  Diagnosis Date   Anemia    Anxiety    Arthritis    CHF (congestive heart failure) (HCC)    Chronic kidney disease    Renal Insufficiency Syndrome; Glomerulosclerosis 2013   Depressed    Diabetes mellitus without complication (HCC)    GERD (gastroesophageal reflux disease)    Headache    High cholesterol    History of kidney stones     HIV (human immunodeficiency virus infection) (HCC)    Hypertension    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HCC)    Myocardial infarction (HCC)    Paranoid disorder (HCC)    Pneumonia    Schizophrenia, paranoid (HCC)    Sleep apnea    Stroke (HCC)    mini TIA   TIA (transient ischemic attack)    Past Surgical History:  Procedure Laterality Date   CARDIAC CATHETERIZATION     CATARACT EXTRACTION W/PHACO Right 04/25/2021   Procedure: CATARACT EXTRACTION PHACO AND INTRAOCULAR LENS PLACEMENT (IOC) RIGHT DIABETIC;  Surgeon: Galen Manila, MD;  Location: [facility] ORS;  Service: Ophthalmology;  Laterality: Right;  3.32 0:23.0   CATARACT EXTRACTION W/PHACO Left 09/19/2021   Procedure: CATARACT EXTRACTION PHACO AND INTRAOCULAR LENS PLACEMENT (IOC) LEFT DIABETIC;  Surgeon: Galen Manila, MD;  Location: [facility] ORS;  Service: Ophthalmology;  Laterality: Left;  6.73 0:49.1   COLONOSCOPY WITH PROPOFOL N/A 09/03/2015   Procedure: COLONOSCOPY WITH PROPOFOL;  Surgeon: Christena Deem, MD;  Location: [facility] ENDOSCOPY;  Service: Endoscopy;  Laterality: N/A;   COLONOSCOPY WITH PROPOFOL N/A 09/17/2020   Procedure: COLONOSCOPY WITH PROPOFOL;  Surgeon: Regis Bill, MD;  Location: [facility] ENDOSCOPY;  Service: Endoscopy;  Laterality: N/A;  PREFERS 9AM OR LATER DUE TO TRANSPORTATION   DG TEETH FULL     ESOPHAGOGASTRODUODENOSCOPY (EGD) WITH PROPOFOL N/A 07/01/2016   Dr. Marva Panda, [facility]. abnormal esophageal motility, suspicious for presbyesophagus, bile  gastritis s/p biopsy, non-bleeding erosive gastropathy s/p biopsy, normal duodenum, path with negative H.pylori and +chronic active gastritis   ESOPHAGOGASTRODUODENOSCOPY (EGD) WITH PROPOFOL N/A 09/17/2020   Procedure: ESOPHAGOGASTRODUODENOSCOPY (EGD) WITH PROPOFOL;  Surgeon: Regis Bill, MD;  Location: [facility] ENDOSCOPY;  Service: Endoscopy;  Laterality: N/A;   EXTRACORPOREAL SHOCK WAVE LITHOTRIPSY Right 11/01/2020   Procedure: EXTRACORPOREAL SHOCK WAVE LITHOTRIPSY (ESWL);   Surgeon: Riki Altes, MD;  Location: [facility] ORS;  Service: Urology;  Laterality: Right;   LUMBAR LAMINECTOMY/DECOMPRESSION MICRODISCECTOMY N/A 07/15/2021   Procedure: L3-5 DECOMPRESSION;  Surgeon: Venetia Night, MD;  Location: [facility] ORS;  Service: Neurosurgery;  Laterality: N/A;   Family History  Problem Relation Age of Onset   Heart attack Mother    Diabetes Mellitus II Mother    Mental illness Mother    CAD Mother    Heart attack Father    CAD Father    Hypertension Father    Social History   Tobacco Use   Smoking status: Never   Smokeless tobacco: Never  Substance Use Topics   Alcohol use: No   Allergies  Allergen Reactions   Trazodone And Nefazodone     Affects other meds that they dont work   [employer] Secondary school teacher Disoproxil]     Damages kidneys   Etodolac Rash   Prior to Admission medications   Medication Sig Start Date End Date Taking? Authorizing Provider  abacavir-dolutegravir-lamiVUDine (TRIUMEQ) 600-50-300 MG tablet Take 1 tablet by mouth at bedtime. 07/21/19  Yes Danny Ito, MD  albuterol (PROVENTIL HFA;VENTOLIN HFA) 108 (90 Base) MCG/ACT inhaler Inhale 2 puffs into the lungs every 6 (six) hours as needed for wheezing or shortness of breath. 04/14/18  Yes Alford Highland, MD  aspirin EC 325 MG tablet Take 325 mg by mouth daily.   Yes [provider]  beclomethasone (QVAR) 40 MCG/ACT inhaler Inhale 2 puffs into the lungs 2 (two) times daily. 09/05/22 09/05/23 Yes [provider]  benztropine (COGENTIN) 0.5 MG tablet Take 3 tablets by mouth daily. 04/21/22  Yes [provider]  brimonidine-timolol (COMBIGAN) 0.2-0.5 % ophthalmic solution Place 1 drop into both eyes every 12 (twelve) hours.   Yes [provider]  docusate sodium (COLACE) 250 MG capsule Take 250 mg by mouth 2 (two) times daily.   Yes [provider]  Fluocinolone Acetonide 0.01 % OIL Place 2-4 drops in ear(s) every other day. At bedtime for dry skin    Yes [provider]  fluticasone (FLOVENT HFA) 220 MCG/ACT inhaler Inhale 2 puffs into the lungs 2 (two) times daily. Rinse out mouth afterwards 04/14/18  Yes Cook, Richard, MD  gabapentin (NEURONTIN) 300 MG capsule Take 2 capsules (600 mg total) by mouth every 8 (eight) hours. 07/22/22  Yes Danny Cook, Danny Donath, MD  gemfibrozil (LOPID) 600 MG tablet Take 600 mg by mouth 2 (two) times daily before a meal.   Yes [provider]  glimepiride (AMARYL) 4 MG tablet Take 4 mg by mouth daily with breakfast. 06/06/19  Yes [provider]  iloperidone (FANAPT) 4 MG TABS tablet Take 1 tablet (4 mg total) by mouth every 12 (twelve) hours. 07/22/22  Yes Danny Cook, Danny Donath, MD  losartan (COZAAR) 50 MG tablet Take 50 mg by mouth daily. 02/02/21  Yes [provider]  meclizine (ANTIVERT) 25 MG tablet Take 1 tablet (25 mg total) by mouth 3 (three) times daily as needed for dizziness. 05/18/22  Yes Danny Cook, Danny Bering, Danny Cook  metFORMIN (GLUCOPHAGE) 1000 MG tablet Take 1,000 mg by mouth  2 (two) times daily with a meal.   Yes [provider]  mirtazapine (REMERON) 30 MG tablet Take 1 tablet (30 mg total) by mouth at bedtime. 07/22/22  Yes Danny Cook, Danny Donath, MD  mometasone (ELOCON) 0.1 % ointment Apply 1 application  topically See admin instructions. Apply 4 drops to ear canal at bedtime every other night for itching and dry skin.   Yes [provider]  nitroGLYCERIN (NITROSTAT) 0.4 MG SL tablet Place 0.4 mg under the tongue every 5 (five) minutes as needed for chest pain.   Yes [provider]  omeprazole (PRILOSEC) 40 MG capsule Take 40 mg by mouth 2 (two) times daily.   Yes [provider]  OZEMPIC, 1 MG/DOSE, 4 MG/3ML SOPN Inject 2 mg into the skin every Monday. 10/10/20  Yes [provider]  rosuvastatin (CRESTOR) 40 MG tablet Take 40 mg by mouth daily.    Yes [provider]  senna (SENOKOT) 8.6 MG TABS tablet Take 1 tablet by mouth  daily.   Yes [provider]  tamsulosin (FLOMAX) 0.4 MG CAPS capsule Take 0.4 mg by mouth daily. 08/02/19  Yes [provider]  venlafaxine XR (EFFEXOR-XR) 75 MG 24 hr capsule Take 1 capsule (75 mg total) by mouth daily with breakfast. 07/23/22  Yes Danny Cook, Danny Donath, MD   Review of Systems  Constitutional:  Positive for fatigue. Negative for appetite change.  HENT:  Positive for rhinorrhea. Negative for congestion, postnasal drip and sore throat.   Eyes: Negative.   Respiratory:  Negative for cough, chest tightness and shortness of breath.   Cardiovascular:  Negative for chest pain, palpitations and leg swelling.  Gastrointestinal:  Negative for abdominal distention and abdominal pain.  Endocrine: Negative.   Genitourinary: Negative.   Musculoskeletal:  Positive for back pain (when standing for long periods of time). Negative for neck pain.  Skin: Negative.   Allergic/Immunologic: Negative.   Neurological:  Positive for light-headedness (at times). Negative for dizziness and weakness.  Hematological:  Negative for adenopathy. Does not bruise/bleed easily.  Psychiatric/Behavioral:  Negative for dysphoric mood and sleep disturbance (sleeping on 1 pillows). The patient is not nervous/anxious.    Vitals:   10/20/22 0913  BP: 122/80  Pulse: 66  Resp: 14  SpO2: 99%  Weight: 191 lb 6 oz (86.8 kg)   Wt Readings from Last 3 Encounters:  10/20/22 191 lb 6 oz (86.8 kg)  09/25/22 190 lb (86.2 kg)  07/12/22 193 lb (87.5 kg)   Lab Results  Component Value Date   CREATININE 1.44 (H) 09/25/2022   CREATININE 1.41 (H) 07/12/2022   CREATININE 1.36 (H) 07/10/2022   Physical Exam Vitals and nursing note reviewed.  Constitutional:      Appearance: He is well-developed.  HENT:     Head: Normocephalic and atraumatic.  Neck:     Vascular: No JVD.  Cardiovascular:     Rate and Rhythm: Normal rate and regular rhythm.  Pulmonary:     Effort: Pulmonary effort is normal.      Breath sounds: No wheezing or rales.  Abdominal:     General: There is no distension.     Palpations: Abdomen is soft.     Tenderness: There is no abdominal tenderness.  Musculoskeletal:        General: No tenderness.     Cervical back: Normal range of motion and neck supple.     Right lower leg: No tenderness. No edema.     Left lower leg: No tenderness.  No edema.  Skin:    General: Skin is warm and dry.  Neurological:     General: No focal deficit present.     Mental Status: He is alert and oriented to person, place, and time.  Psychiatric:        Mood and Affect: Mood normal.        Behavior: Behavior normal.        Thought Content: Thought content normal.    Assessment & Plan:  1: Chronic heart failure with preserved ejection fraction- - NYHA class II - euvolemic today - weighing daily; reminded to call for an overnight weight gain of >2 pounds or a weekly weight gain of >5 pounds - weight down 2 pounds from last visit here 3 months ago - Echo 07/03/22: EF  65-70% with mild LVH & mild Danny. Echo 07/25/20: EF of 55-60% along with mild LVH. Echo 06/08/18: EF of 55-60%. Echo 03/25/18: EF of 55-60%. Echo 07/31/17: EF of 55-65%. Echo 05/11/17: EF of 55% along with mild AR/ TR/ Danny. - not adding salt to his food and trying to watch the sodium content of his food - saw cardiology (Danny Cook) 10/23; returns 06/24 - continue losartan 50mg  daily - BNP 07/10/22 was 20.9  2: HTN- - BP 122/80 - BMP from 09/25/22 reviewed and showed sodium 137, potassium 3.9, creatinine 1.44 and GFR 55 - saw PCP Danny Cook) 02/24  3: Diabetes- - A1c 08/11/22 was 7.0% - glucose at home this morning was 115 - saw endocrinologist Danny Cook) 04/24 - saw nephrologist Danny Cook) 02/24/22  4: Depression- - stable at this time - did have psych admission back in October 2023   Return in 5 months, sooner if needed.

## 2022-10-20 ENCOUNTER — Ambulatory Visit: Payer: 59 | Attending: Family | Admitting: Family

## 2022-10-20 ENCOUNTER — Encounter: Payer: Self-pay | Admitting: Family

## 2022-10-20 VITALS — BP 122/80 | HR 66 | Resp 14 | Wt 191.4 lb

## 2022-10-20 DIAGNOSIS — N189 Chronic kidney disease, unspecified: Secondary | ICD-10-CM | POA: Insufficient documentation

## 2022-10-20 DIAGNOSIS — Z8616 Personal history of COVID-19: Secondary | ICD-10-CM | POA: Diagnosis not present

## 2022-10-20 DIAGNOSIS — I5032 Chronic diastolic (congestive) heart failure: Secondary | ICD-10-CM

## 2022-10-20 DIAGNOSIS — I130 Hypertensive heart and chronic kidney disease with heart failure and stage 1 through stage 4 chronic kidney disease, or unspecified chronic kidney disease: Secondary | ICD-10-CM | POA: Insufficient documentation

## 2022-10-20 DIAGNOSIS — G4733 Obstructive sleep apnea (adult) (pediatric): Secondary | ICD-10-CM | POA: Diagnosis not present

## 2022-10-20 DIAGNOSIS — I10 Essential (primary) hypertension: Secondary | ICD-10-CM | POA: Diagnosis not present

## 2022-10-20 DIAGNOSIS — F32A Depression, unspecified: Secondary | ICD-10-CM | POA: Insufficient documentation

## 2022-10-20 DIAGNOSIS — M1990 Unspecified osteoarthritis, unspecified site: Secondary | ICD-10-CM | POA: Diagnosis not present

## 2022-10-20 DIAGNOSIS — E1122 Type 2 diabetes mellitus with diabetic chronic kidney disease: Secondary | ICD-10-CM | POA: Diagnosis not present

## 2022-10-20 DIAGNOSIS — R42 Dizziness and giddiness: Secondary | ICD-10-CM | POA: Insufficient documentation

## 2022-10-20 DIAGNOSIS — M549 Dorsalgia, unspecified: Secondary | ICD-10-CM | POA: Diagnosis not present

## 2022-10-20 DIAGNOSIS — G8929 Other chronic pain: Secondary | ICD-10-CM | POA: Insufficient documentation

## 2022-10-20 DIAGNOSIS — Z79899 Other long term (current) drug therapy: Secondary | ICD-10-CM | POA: Diagnosis not present

## 2022-10-20 DIAGNOSIS — Z21 Asymptomatic human immunodeficiency virus [HIV] infection status: Secondary | ICD-10-CM | POA: Diagnosis not present

## 2022-10-20 DIAGNOSIS — F329 Major depressive disorder, single episode, unspecified: Secondary | ICD-10-CM | POA: Diagnosis not present

## 2022-10-20 DIAGNOSIS — R5383 Other fatigue: Secondary | ICD-10-CM | POA: Insufficient documentation

## 2022-10-20 DIAGNOSIS — E785 Hyperlipidemia, unspecified: Secondary | ICD-10-CM | POA: Insufficient documentation

## 2022-10-20 DIAGNOSIS — Z7984 Long term (current) use of oral hypoglycemic drugs: Secondary | ICD-10-CM | POA: Insufficient documentation

## 2022-10-20 DIAGNOSIS — Z794 Long term (current) use of insulin: Secondary | ICD-10-CM

## 2022-10-20 DIAGNOSIS — N182 Chronic kidney disease, stage 2 (mild): Secondary | ICD-10-CM

## 2022-12-02 ENCOUNTER — Ambulatory Visit: Payer: 59 | Admitting: Physician Assistant

## 2022-12-11 ENCOUNTER — Other Ambulatory Visit: Payer: Self-pay

## 2022-12-11 DIAGNOSIS — N200 Calculus of kidney: Secondary | ICD-10-CM

## 2022-12-12 ENCOUNTER — Ambulatory Visit (INDEPENDENT_AMBULATORY_CARE_PROVIDER_SITE_OTHER): Payer: 59 | Admitting: Physician Assistant

## 2022-12-12 ENCOUNTER — Ambulatory Visit
Admission: RE | Admit: 2022-12-12 | Discharge: 2022-12-12 | Disposition: A | Discharge status: Home / Self Care | Payer: 59 | Source: Ambulatory Visit | Attending: Physician Assistant | Admitting: Physician Assistant

## 2022-12-12 VITALS — BP 151/83 | HR 67 | Ht 67.0 in | Wt 191.0 lb

## 2022-12-12 DIAGNOSIS — N200 Calculus of kidney: Secondary | ICD-10-CM | POA: Diagnosis present

## 2022-12-12 DIAGNOSIS — Z09 Encounter for follow-up examination after completed treatment for conditions other than malignant neoplasm: Secondary | ICD-10-CM | POA: Diagnosis not present

## 2022-12-12 DIAGNOSIS — Z87442 Personal history of urinary calculi: Secondary | ICD-10-CM

## 2022-12-12 NOTE — Progress Notes (Signed)
Danny Cook. presents for an office/procedure visit. BP today is 159/84 . He is complaint with BP medication. Greater than 140/90. Provider  notified. Pt advised to follow up with PCP . Pt voiced understanding.

## 2022-12-12 NOTE — Progress Notes (Signed)
12/12/2022 11:30 AM   Danny Cook. 1961-02-09 [id number]  CC: Chief Complaint  Patient presents with   Follow-up   Nephrolithiasis   HPI: Danny Cook. is a 62 y.o. male with PMH intermittent painless gross hematuria and nephrolithiasis who presents today for annual follow-up.   Today he reports doing well with no acute urologic concerns.  No episodes of flank pain or gross hematuria over the past year.  He has had multiple admissions over the past year and he states he is doing much better now that they have adjusted some of his medications.  KUB today with stable phleboliths and no evidence of renal stones.  He had a CTAP with contrast on 04/14/2022, no urolithiasis seen at that time either.  He has had multiple rounds of urine testing over the past year with no microscopic hematuria noted.  PMH: Past Medical History:  Diagnosis Date   Anemia    Anxiety    Arthritis    CHF (congestive heart failure) (HCC)    Chronic kidney disease    Renal Insufficiency Syndrome; Glomerulosclerosis 2013   Depressed    Diabetes mellitus without complication (HCC)    GERD (gastroesophageal reflux disease)    Headache    High cholesterol    History of kidney stones    HIV (human immunodeficiency virus infection) (HCC)    Hypertension    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HCC)    Myocardial infarction (HCC)    Paranoid disorder (HCC)    Pneumonia    Schizophrenia, paranoid (HCC)    Sleep apnea    Stroke (HCC)    mini TIA   TIA (transient ischemic attack)     Surgical History: Past Surgical History:  Procedure Laterality Date   CARDIAC CATHETERIZATION     CATARACT EXTRACTION W/PHACO Right 04/25/2021   Procedure: CATARACT EXTRACTION PHACO AND INTRAOCULAR LENS PLACEMENT (IOC) RIGHT DIABETIC;  Surgeon: Galen Manila, MD;  Location: [facility] ORS;  Service: Ophthalmology;  Laterality: Right;  3.32 0:23.0   CATARACT EXTRACTION W/PHACO Left 09/19/2021   Procedure: CATARACT  EXTRACTION PHACO AND INTRAOCULAR LENS PLACEMENT (IOC) LEFT DIABETIC;  Surgeon: Galen Manila, MD;  Location: [facility];  Service: Ophthalmology;  Laterality: Left;  6.73 0:49.1   COLONOSCOPY WITH PROPOFOL N/A 09/03/2015   Procedure: COLONOSCOPY WITH PROPOFOL;  Surgeon: Christena Deem, MD;  Location: [facility] ENDOSCOPY;  Service: Endoscopy;  Laterality: N/A;   COLONOSCOPY WITH PROPOFOL N/A 09/17/2020   Procedure: COLONOSCOPY WITH PROPOFOL;  Surgeon: Regis Bill, MD;  Location: [facility] ENDOSCOPY;  Service: Endoscopy;  Laterality: N/A;  PREFERS 9AM OR LATER DUE TO TRANSPORTATION   DG TEETH FULL     ESOPHAGOGASTRODUODENOSCOPY (EGD) WITH PROPOFOL N/A 07/01/2016   Dr. Marva Panda, [facility]. abnormal esophageal motility, suspicious for presbyesophagus, bile gastritis s/p biopsy, non-bleeding erosive gastropathy s/p biopsy, normal duodenum, path with negative H.pylori and +chronic active gastritis   ESOPHAGOGASTRODUODENOSCOPY (EGD) WITH PROPOFOL N/A 09/17/2020   Procedure: ESOPHAGOGASTRODUODENOSCOPY (EGD) WITH PROPOFOL;  Surgeon: Regis Bill, MD;  Location: [facility] ENDOSCOPY;  Service: Endoscopy;  Laterality: N/A;   EXTRACORPOREAL SHOCK WAVE LITHOTRIPSY Right 11/01/2020   Procedure: EXTRACORPOREAL SHOCK WAVE LITHOTRIPSY (ESWL);  Surgeon: Riki Altes, MD;  Location: [facility] OR[facility];  Service: Urology;  Laterality: Right;   LUMBAR LAMINECTOMY/DECOMPRESSION MICRODISCECTOMY N/A 07/15/2021   Procedure: L3-5 DECOMPRESSION;  Surgeon: Venetia Night, MD;  Location: [facility] OR[facility];  Service: Neurosurgery;  Laterality: N/A;    Home Medications:  Allergies as of 12/12/2022  Reactions   Trazodone And Nefazodone    Affects other meds that they dont work   Viread [tenofovir Disoproxil]    Damages kidneys   Etodolac Rash        Medication List        Accurate as of December 12, 2022 11:30 AM. If you have any questions, ask your nurse or doctor.          albuterol 108 (90 Base) MCG/ACT  inhaler Commonly known as: VENTOLIN HFA Inhale 2 puffs into the lungs every 6 (six) hours as needed for wheezing or shortness of breath.   aspirin EC 325 MG tablet Take 325 mg by mouth daily.   beclomethasone 40 MCG/ACT inhaler Commonly known as: QVAR Inhale 2 puffs into the lungs 2 (two) times daily.   benztropine 0.5 MG tablet Commonly known as: COGENTIN Take 3 tablets by mouth daily.   brimonidine-timolol 0.2-0.5 % ophthalmic solution Commonly known as: COMBIGAN Place 1 drop into both eyes every 12 (twelve) hours.   diazepam 2 MG tablet Commonly known as: VALIUM Take 2 mg by mouth 3 (three) times daily.   dicyclomine 10 MG capsule Commonly known as: BENTYL Take 10 mg by mouth 4 (four) times daily before meals and nightly   divalproex 500 MG 24 hr tablet Commonly known as: DEPAKOTE ER Take 500 mg by mouth daily.   docusate sodium 250 MG capsule Commonly known as: COLACE Take 250 mg by mouth 2 (two) times daily.   Fluocinolone Acetonide 0.01 % Oil Place 2-4 drops in ear(s) every other day. At bedtime for dry skin   fluticasone 220 MCG/ACT inhaler Commonly known as: Flovent HFA Inhale 2 puffs into the lungs 2 (two) times daily. Rinse out mouth afterwards   gabapentin 300 MG capsule Commonly known as: NEURONTIN Take 2 capsules (600 mg total) by mouth every 8 (eight) hours.   gemfibrozil 600 MG tablet Commonly known as: LOPID Take 600 mg by mouth 2 (two) times daily before a meal.   glimepiride 4 MG tablet Commonly known as: AMARYL Take 4 mg by mouth daily with breakfast.   iloperidone 4 MG Tabs tablet Commonly known as: FANAPT Take 1 tablet (4 mg total) by mouth every 12 (twelve) hours.   losartan 50 MG tablet Commonly known as: COZAAR Take 50 mg by mouth daily.   meclizine 25 MG tablet Commonly known as: ANTIVERT Take 1 tablet (25 mg total) by mouth 3 (three) times daily as needed for dizziness.   metFORMIN 1000 MG tablet Commonly known as:  GLUCOPHAGE Take 1,000 mg by mouth 2 (two) times daily with a meal.   mirtazapine 30 MG tablet Commonly known as: REMERON Take 1 tablet (30 mg total) by mouth at bedtime.   mometasone 0.1 % ointment Commonly known as: ELOCON Apply 1 application  topically See admin instructions. Apply 4 drops to ear canal at bedtime every other night for itching and dry skin.   nitroGLYCERIN 0.4 MG SL tablet Commonly known as: NITROSTAT Place 0.4 mg under the tongue every 5 (five) minutes as needed for chest pain.   omeprazole 40 MG capsule Commonly known as: PRILOSEC Take 40 mg by mouth 2 (two) times daily.   OneTouch Verio test strip Generic drug: glucose blood 4 (four) times daily.   Precision QID Test test strip Generic drug: glucose blood 1 each (1 strip total) 4 (four) times daily Use as instructed.   Ozempic (1 MG/DOSE) 4 MG/3ML Sopn Generic drug: Semaglutide (1 MG/DOSE) Inject 2  mg into the skin every Monday.   rosuvastatin 40 MG tablet Commonly known as: CRESTOR Take 40 mg by mouth daily.   senna 8.6 MG Tabs tablet Commonly known as: SENOKOT Take 1 tablet by mouth daily.   tamsulosin 0.4 MG Caps capsule Commonly known as: FLOMAX Take 0.4 mg by mouth daily.   Triumeq 600-50-300 MG tablet Generic drug: abacavir-dolutegravir-lamiVUDine Take 1 tablet by mouth at bedtime.   UltiCare Mini Pen Needles 31G X 6 MM Misc Generic drug: Insulin Pen Needle See admin instructions.   valsartan 40 MG tablet Commonly known as: DIOVAN Take by mouth.   venlafaxine XR 75 MG 24 hr capsule Commonly known as: EFFEXOR-XR Take 1 capsule (75 mg total) by mouth daily with breakfast.   ziprasidone 60 MG capsule Commonly known as: GEODON Take 60 mg by mouth 2 (two) times daily.        Allergies:  Allergies  Allergen Reactions   Trazodone And Nefazodone     Affects other meds that they dont work   Viread Secondary school teacher Disoproxil]     Damages kidneys   Etodolac Rash    Family  History: Family History  Problem Relation Age of Onset   Heart attack Mother    Diabetes Mellitus II Mother    Mental illness Mother    CAD Mother    Heart attack Father    CAD Father    Hypertension Father     Social History:   reports that he has never smoked. He has never used smokeless tobacco. He reports that he does not drink alcohol and does not use drugs.  Physical Exam: BP (!) 159/84   Pulse 62   Ht 5\' 7"  (1.702 m)   Wt 191 lb (86.6 kg)   BMI 29.91 kg/m   Constitutional:  Alert and oriented, no acute distress, nontoxic appearing HEENT: [place], AT Cardiovascular: No clubbing, cyanosis, or edema Respiratory: Normal respiratory effort, no increased work of breathing Skin: No rashes, bruises or suspicious lesions Neurologic: Grossly intact, no focal deficits, moving all 4 extremities Psychiatric: Normal mood and affect  Pertinent Imaging: KUB, 12/12/2022: CLINICAL DATA:  Renal calculi   EXAM: ABDOMEN - 1 VIEW   COMPARISON:  11/29/2021, 04/14/2022   FINDINGS: Two supine frontal views of the abdomen and pelvis are obtained. There are no radiopaque urinary tract calculi. Stable vascular calcifications within the pelvis. No bowel obstruction or ileus. Lung bases are clear. No acute bony abnormalities.   IMPRESSION: 1. No radiopaque urinary tract calculi.     Electronically Signed   By: Sharlet Salina M.D.   On: 12/18/2022 15:31  I personally reviewed the images referenced above and note stable phleboliths and no evidence of renal stones.  Assessment & Plan:   1. Nephrolithiasis No hematuria or flank pain over the past year.  KUB stable with no evidence of interval stone development.  Since he is not developed any new stones in the past year, I offered him to follow-up as needed, but he prefers closer follow-up with annual visits and KUBs, which is reasonable.  Will see him back next year, sooner if needed.  We discussed return precautions especially including  gross hematuria.   Return in about 1 year (around 12/12/2023) for Annual stone visit with KUB prior.  Carman Ching, PA-C  [facility] [place] [address] [place], [place] [address] [phone]

## 2023-03-03 ENCOUNTER — Emergency Department: Payer: 59

## 2023-03-03 ENCOUNTER — Other Ambulatory Visit: Payer: Self-pay

## 2023-03-03 ENCOUNTER — Emergency Department
Admission: EM | Admit: 2023-03-03 | Discharge: 2023-03-03 | Discharge status: Against Medical Advice | Payer: 59 | Attending: Emergency Medicine | Admitting: Emergency Medicine

## 2023-03-03 DIAGNOSIS — R079 Chest pain, unspecified: Secondary | ICD-10-CM | POA: Insufficient documentation

## 2023-03-03 DIAGNOSIS — Z5321 Procedure and treatment not carried out due to patient leaving prior to being seen by health care provider: Secondary | ICD-10-CM | POA: Diagnosis not present

## 2023-03-03 LAB — CBC
HCT: 38.9 % — ABNORMAL LOW (ref 39.0–52.0)
Hemoglobin: 13.2 g/dL (ref 13.0–17.0)
MCH: 31.4 pg (ref 26.0–34.0)
MCHC: 33.9 g/dL (ref 30.0–36.0)
MCV: 92.6 fL (ref 80.0–100.0)
Platelets: 187 10*3/uL (ref 150–400)
RBC: 4.2 MIL/uL — ABNORMAL LOW (ref 4.22–5.81)
RDW: 12.9 % (ref 11.5–15.5)
WBC: 8.4 10*3/uL (ref 4.0–10.5)
nRBC: 0 % (ref 0.0–0.2)

## 2023-03-03 LAB — BASIC METABOLIC PANEL
Anion gap: 12 (ref 5–15)
BUN: 37 mg/dL — ABNORMAL HIGH (ref 8–23)
CO2: 23 mmol/L (ref 22–32)
Calcium: 8.7 mg/dL — ABNORMAL LOW (ref 8.9–10.3)
Chloride: 104 mmol/L (ref 98–111)
Creatinine, Ser: 1.66 mg/dL — ABNORMAL HIGH (ref 0.61–1.24)
GFR, Estimated: 46 mL/min — ABNORMAL LOW (ref >60)
Glucose, Bld: 163 mg/dL — ABNORMAL HIGH (ref 70–99)
Potassium: 4.1 mmol/L (ref 3.5–5.1)
Sodium: 139 mmol/L (ref 135–145)

## 2023-03-03 LAB — TROPONIN I (HIGH SENSITIVITY): Troponin I (High Sensitivity): 29 ng/L — ABNORMAL HIGH (ref <18)

## 2023-03-03 NOTE — ED Triage Notes (Signed)
Pt to ED via [facility] c/o chest pain. Pt reports he was feeling dizzy earlier today and went to his PCP. His doctor lowered his BP dose today because they had recently upped the dose. Pt was having chest pain at that time too but it would come and go. No CP at this time. Denies SOB, dizziness, fevers.

## 2023-03-04 ENCOUNTER — Emergency Department
Admission: EM | Admit: 2023-03-04 | Discharge: 2023-03-04 | Discharge status: Against Medical Advice | Payer: 59 | Attending: Emergency Medicine | Admitting: Emergency Medicine

## 2023-03-04 ENCOUNTER — Telehealth: Payer: Self-pay | Admitting: Emergency Medicine

## 2023-03-04 ENCOUNTER — Encounter: Payer: Self-pay | Admitting: Emergency Medicine

## 2023-03-04 DIAGNOSIS — R531 Weakness: Secondary | ICD-10-CM | POA: Diagnosis present

## 2023-03-04 DIAGNOSIS — Z5321 Procedure and treatment not carried out due to patient leaving prior to being seen by health care provider: Secondary | ICD-10-CM | POA: Insufficient documentation

## 2023-03-04 DIAGNOSIS — R110 Nausea: Secondary | ICD-10-CM | POA: Insufficient documentation

## 2023-03-04 LAB — CBC
HCT: 41.5 % (ref 39.0–52.0)
Hemoglobin: 14.1 g/dL (ref 13.0–17.0)
MCH: 31.2 pg (ref 26.0–34.0)
MCHC: 34 g/dL (ref 30.0–36.0)
MCV: 91.8 fL (ref 80.0–100.0)
Platelets: 198 10*3/uL (ref 150–400)
RBC: 4.52 MIL/uL (ref 4.22–5.81)
RDW: 12.7 % (ref 11.5–15.5)
WBC: 7.9 10*3/uL (ref 4.0–10.5)
nRBC: 0 % (ref 0.0–0.2)

## 2023-03-04 LAB — BASIC METABOLIC PANEL
Anion gap: 12 (ref 5–15)
BUN: 32 mg/dL — ABNORMAL HIGH (ref 8–23)
CO2: 21 mmol/L — ABNORMAL LOW (ref 22–32)
Calcium: 9 mg/dL (ref 8.9–10.3)
Chloride: 106 mmol/L (ref 98–111)
Creatinine, Ser: 1.38 mg/dL — ABNORMAL HIGH (ref 0.61–1.24)
GFR, Estimated: 57 mL/min — ABNORMAL LOW (ref >60)
Glucose, Bld: 141 mg/dL — ABNORMAL HIGH (ref 70–99)
Potassium: 4.2 mmol/L (ref 3.5–5.1)
Sodium: 139 mmol/L (ref 135–145)

## 2023-03-04 LAB — TROPONIN I (HIGH SENSITIVITY): Troponin I (High Sensitivity): 18 ng/L — ABNORMAL HIGH (ref <18)

## 2023-03-04 LAB — CBG MONITORING, ED: Glucose-Capillary: 121 mg/dL — ABNORMAL HIGH (ref 70–99)

## 2023-03-04 NOTE — Telephone Encounter (Signed)
Called patient due to left emergency department before provider exam to inquire about condition and follow up plans.  Says he is not having chest pain now, but just feels funny. I explained elevated troponin and advised to return to ED.  Says he has already called pcp and is waiting for call back.  Says he will wait and see what his pcp says.

## 2023-03-04 NOTE — ED Notes (Signed)
Patient informed this RN that he had spoke with his PCP over the phone and stated he no longer wanted to stay to be seen. This RN encouraged patient to stay and be evaluated. Patient refused and left ED. NAD noted. Ambulatory out of ED.

## 2023-03-04 NOTE — ED Triage Notes (Signed)
Patient to ED via POV for generalized weakness and nausea. Patient states he check in last PM for CP but LBBS- called by RN this AM due to a trop of 29. Pt denies CP at this time. Pt reports "feeling funny and strange."

## 2023-03-16 ENCOUNTER — Encounter: Payer: Self-pay | Admitting: Family

## 2023-03-16 ENCOUNTER — Ambulatory Visit: Payer: 59 | Attending: Family | Admitting: Family

## 2023-03-16 VITALS — BP 120/65 | HR 69 | Resp 14 | Wt 197.1 lb

## 2023-03-16 DIAGNOSIS — D649 Anemia, unspecified: Secondary | ICD-10-CM | POA: Diagnosis not present

## 2023-03-16 DIAGNOSIS — I441 Atrioventricular block, second degree: Secondary | ICD-10-CM | POA: Diagnosis not present

## 2023-03-16 DIAGNOSIS — I130 Hypertensive heart and chronic kidney disease with heart failure and stage 1 through stage 4 chronic kidney disease, or unspecified chronic kidney disease: Secondary | ICD-10-CM | POA: Insufficient documentation

## 2023-03-16 DIAGNOSIS — Z794 Long term (current) use of insulin: Secondary | ICD-10-CM

## 2023-03-16 DIAGNOSIS — I471 Supraventricular tachycardia, unspecified: Secondary | ICD-10-CM | POA: Insufficient documentation

## 2023-03-16 DIAGNOSIS — M1990 Unspecified osteoarthritis, unspecified site: Secondary | ICD-10-CM | POA: Insufficient documentation

## 2023-03-16 DIAGNOSIS — F329 Major depressive disorder, single episode, unspecified: Secondary | ICD-10-CM | POA: Diagnosis not present

## 2023-03-16 DIAGNOSIS — Z79899 Other long term (current) drug therapy: Secondary | ICD-10-CM | POA: Diagnosis not present

## 2023-03-16 DIAGNOSIS — F32A Depression, unspecified: Secondary | ICD-10-CM | POA: Diagnosis not present

## 2023-03-16 DIAGNOSIS — I428 Other cardiomyopathies: Secondary | ICD-10-CM | POA: Insufficient documentation

## 2023-03-16 DIAGNOSIS — B20 Human immunodeficiency virus [HIV] disease: Secondary | ICD-10-CM | POA: Diagnosis not present

## 2023-03-16 DIAGNOSIS — C469 Kaposi's sarcoma, unspecified: Secondary | ICD-10-CM | POA: Diagnosis not present

## 2023-03-16 DIAGNOSIS — I10 Essential (primary) hypertension: Secondary | ICD-10-CM | POA: Diagnosis not present

## 2023-03-16 DIAGNOSIS — Z5982 Transportation insecurity: Secondary | ICD-10-CM | POA: Diagnosis not present

## 2023-03-16 DIAGNOSIS — I5032 Chronic diastolic (congestive) heart failure: Secondary | ICD-10-CM | POA: Diagnosis not present

## 2023-03-16 DIAGNOSIS — N189 Chronic kidney disease, unspecified: Secondary | ICD-10-CM | POA: Insufficient documentation

## 2023-03-16 DIAGNOSIS — I491 Atrial premature depolarization: Secondary | ICD-10-CM | POA: Diagnosis not present

## 2023-03-16 DIAGNOSIS — F200 Paranoid schizophrenia: Secondary | ICD-10-CM | POA: Insufficient documentation

## 2023-03-16 DIAGNOSIS — G4733 Obstructive sleep apnea (adult) (pediatric): Secondary | ICD-10-CM | POA: Insufficient documentation

## 2023-03-16 DIAGNOSIS — E1122 Type 2 diabetes mellitus with diabetic chronic kidney disease: Secondary | ICD-10-CM | POA: Diagnosis not present

## 2023-03-16 DIAGNOSIS — N182 Chronic kidney disease, stage 2 (mild): Secondary | ICD-10-CM

## 2023-03-16 DIAGNOSIS — Z8616 Personal history of COVID-19: Secondary | ICD-10-CM | POA: Insufficient documentation

## 2023-03-16 DIAGNOSIS — I509 Heart failure, unspecified: Secondary | ICD-10-CM | POA: Insufficient documentation

## 2023-03-16 MED ORDER — NITROGLYCERIN 0.4 MG SL SUBL: 0.4000 mg | SUBLINGUAL_TABLET | SUBLINGUAL | 5 refills | Status: AC | PRN

## 2023-03-16 NOTE — Patient Instructions (Signed)
It was good to see you today. Keep up the good work!

## 2023-03-16 NOTE — Progress Notes (Signed)
[id number], Teena Irani, MD (last seen 09/24) Primary Cardiologist: Marcina Millard, MD (last seen 09/24)   HPI:   Mr Kopas is a 62 y/o male with a history of DM, hyperlipidemia, HTN, CKD, HIV (since 1990's), schizophrenia, obstructive sleep apnea, anxiety, anemia, depression, kaposi's sarcoma, osteoarthritis and chronic heart failure.   Admitted 11/03/2020 for 48-hour episode of weakness, status post lithotripsy 2 days prior. ECG revealed premature atrial contractions and intermittent second-degree AV block.   Patient wore a 7-day Holter monitor starting on 01/23/2021, which revealed predominant sinus rhythm with a mean heart rate of 65 bpm. There were occasional premature atrial contractions and premature ventricular contractions present. There were infrequent dropped beats due to Mobitz 2 second-degree AV block. There were occasional episodes of SVT, the longest lasting 6 minutes and 49 seconds at a rate of 166 bpm.   Was in the ED 12/03/2021 for chief complaint of lightheadedness with chest discomfort. ECG revealed sinus bradycardia with first-degree AV block without ischemic ST-T wave changes. Troponin was negative (8, 9). Symptoms were felt to be due to hypoglycemia with capillary blood glucose of 46.   Was in the ED 05/18/22 due to dizziness for 2 days along with possible thrush on his tongue. IVF given. Head CT negative. Had a psych admission in October along with 3 other ED visits in October. Was in the ED 09/25/22 due to dizziness/ fatigue thought to be due to dehydration. Found to be covid +. Was in the ED twice 09/24 but both times left without being seen.   Echo 05/11/17: EF of 55% along with mild AR/ TR/ MR. Echo 07/31/17: EF of 55-65%.  Echo 03/25/18: EF of 55-60%.  Echo 06/08/18: EF of 55-60%.  Echo 07/25/20: EF of 55-60% along with mild LVH Echo 07/03/22: EF  65-70% with mild LVH & mild MR.    He presents today for a HF follow-up visit with a chief complaint of minimal shortness of  breath with moderate exertion. Chronic in nature. Has associated intermittent palpitations, dizziness along with this. Sleeping well on 1 pillow. Denies fatigue, chest pain, cough, abdominal distention, pedal edema or weight gain. Says that he is very sleepy this morning because he got up early.   Not using CPAP machine due to air leakage around his mask. Estimates that he hasn't used it in the last 6 months.   Continues to live at [facility] and says that he's enjoying living there. Gets his medications pill packed from his pharmacy so is having no problems taking them. Medications get delivered from his pharmacy  ROS: All systems negative except as listed in HPI, PMH and Problem List.  SH:  Social History   Socioeconomic History   Marital status: Single    Spouse name: Not on file   Number of children: Not on file   Years of education: 11   Highest education level: GED or equivalent  Occupational History   Not on file  Tobacco Use   Smoking status: Never   Smokeless tobacco: Never  Vaping Use   Vaping status: Never Used  Substance and Sexual Activity   Alcohol use: No   Drug use: No    Comment: denies street drugs   Sexual activity: Never  Other Topics Concern   Not on file  Social History Narrative   Not on file   Social Determinants of Health   Financial Resource Strain: Low Risk  (08/12/2017)   Overall Financial Resource Strain (CARDIA)    Difficulty of Paying  Living Expenses: Not hard at all  Food Insecurity: No Food Insecurity (07/13/2022)   Hunger Vital Sign    Worried About Running Out of Food in the Last Year: Never true    Ran Out of Food in the Last Year: Never true  Recent Concern: Food Insecurity - Food Insecurity Present (04/15/2022)   Hunger Vital Sign    Worried About Running Out of Food in the Last Year: Sometimes true    Ran Out of Food in the Last Year: Sometimes true  Transportation Needs: No Transportation Needs (07/13/2022)   PRAPARE -  Administrator, Civil Service (Medical): No    Lack of Transportation (Non-Medical): No  Recent Concern: Transportation Needs - Unmet Transportation Needs (04/15/2022)   PRAPARE - Transportation    Lack of Transportation (Medical): Yes    Lack of Transportation (Non-Medical): Yes  Physical Activity: Inactive (08/12/2017)   Exercise Vital Sign    Days of Exercise per Week: 0 days    Minutes of Exercise per Session: 0 min  Stress: Stress Concern Present (08/12/2017)   [employer] of Occupational Health - Occupational Stress Questionnaire    Feeling of Stress : To some extent  Social Connections: Moderately Isolated (08/12/2017)   Social Connection and Isolation Panel [NHANES]    Frequency of Communication with Friends and Family: More than three times a week    Frequency of Social Gatherings with Friends and Family: Three times a week    Attends Religious Services: Never    Active Member of Clubs or Organizations: No    Attends Banker Meetings: Never    Marital Status: Never married  Intimate Partner Violence: Not At Risk (07/13/2022)   Humiliation, Afraid, Rape, and Kick questionnaire    Fear of Current or Ex-Partner: No    Emotionally Abused: No    Physically Abused: No    Sexually Abused: No    FH:  Family History  Problem Relation Age of Onset   Heart attack Mother    Diabetes Mellitus II Mother    Mental illness Mother    CAD Mother    Heart attack Father    CAD Father    Hypertension Father     Past Medical History:  Diagnosis Date   Anemia    Anxiety    Arthritis    CHF (congestive heart failure) (HCC)    Chronic kidney disease    Renal Insufficiency Syndrome; Glomerulosclerosis 2013   Depressed    Diabetes mellitus without complication (HCC)    GERD (gastroesophageal reflux disease)    Headache    High cholesterol    History of kidney stones    HIV (human immunodeficiency virus infection) (HCC)    Hypertension    Kaposi's  sarcoma (HCC)    Myocardial infarction (HCC)    Paranoid disorder (HCC)    Pneumonia    Schizophrenia, paranoid (HCC)    Sleep apnea    Stroke (HCC)    mini TIA   TIA (transient ischemic attack)     Current Outpatient Medications  Medication Sig Dispense Refill   abacavir-dolutegravir-lamiVUDine (TRIUMEQ) 600-50-300 MG tablet Take 1 tablet by mouth at bedtime. 30 tablet 6   albuterol (PROVENTIL HFA;VENTOLIN HFA) 108 (90 Base) MCG/ACT inhaler Inhale 2 puffs into the lungs every 6 (six) hours as needed for wheezing or shortness of breath. 1 Inhaler 2   aspirin EC 325 MG tablet Take 325 mg by mouth daily.     beclomethasone (QVAR) 40  MCG/ACT inhaler Inhale 2 puffs into the lungs 2 (two) times daily.     benztropine (COGENTIN) 0.5 MG tablet Take 3 tablets by mouth daily.     brimonidine-timolol (COMBIGAN) 0.2-0.5 % ophthalmic solution Place 1 drop into both eyes every 12 (twelve) hours.     diazepam (VALIUM) 2 MG tablet Take 2 mg by mouth 3 (three) times daily.     dicyclomine (BENTYL) 10 MG capsule Take 10 mg by mouth 4 (four) times daily before meals and nightly     divalproex (DEPAKOTE ER) 500 MG 24 hr tablet Take 500 mg by mouth daily.     docusate sodium (COLACE) 250 MG capsule Take 250 mg by mouth 2 (two) times daily.     Fluocinolone Acetonide 0.01 % OIL Place 2-4 drops in ear(s) every other day. At bedtime for dry skin     fluticasone (FLOVENT HFA) 220 MCG/ACT inhaler Inhale 2 puffs into the lungs 2 (two) times daily. Rinse out mouth afterwards 1 Inhaler 12   gabapentin (NEURONTIN) 300 MG capsule Take 2 capsules (600 mg total) by mouth every 8 (eight) hours. 180 capsule 0   gemfibrozil (LOPID) 600 MG tablet Take 600 mg by mouth 2 (two) times daily before a meal.     glimepiride (AMARYL) 4 MG tablet Take 4 mg by mouth daily with breakfast.     glucose blood (PRECISION QID TEST) test strip 1 each (1 strip total) 4 (four) times daily Use as instructed.     iloperidone (FANAPT) 4 MG TABS  tablet Take 1 tablet (4 mg total) by mouth every 12 (twelve) hours. 60 tablet 0   losartan (COZAAR) 50 MG tablet Take 50 mg by mouth daily.     meclizine (ANTIVERT) 25 MG tablet Take 1 tablet (25 mg total) by mouth 3 (three) times daily as needed for dizziness. 30 tablet 0   metFORMIN (GLUCOPHAGE) 1000 MG tablet Take 1,000 mg by mouth 2 (two) times daily with a meal.     mirtazapine (REMERON) 30 MG tablet Take 1 tablet (30 mg total) by mouth at bedtime. 30 tablet 0   mometasone (ELOCON) 0.1 % ointment Apply 1 application  topically See admin instructions. Apply 4 drops to ear canal at bedtime every other night for itching and dry skin.     nitroGLYCERIN (NITROSTAT) 0.4 MG SL tablet Place 0.4 mg under the tongue every 5 (five) minutes as needed for chest pain.     omeprazole (PRILOSEC) 40 MG capsule Take 40 mg by mouth 2 (two) times daily.     ONETOUCH VERIO test strip 4 (four) times daily.     OZEMPIC, 1 MG/DOSE, 4 MG/3ML SOPN Inject 2 mg into the skin every Monday.     rosuvastatin (CRESTOR) 40 MG tablet Take 40 mg by mouth daily.      senna (SENOKOT) 8.6 MG TABS tablet Take 1 tablet by mouth daily.     tamsulosin (FLOMAX) 0.4 MG CAPS capsule Take 0.4 mg by mouth daily.     ULTICARE MINI PEN NEEDLES 31G X 6 MM MISC See admin instructions.     valsartan (DIOVAN) 40 MG tablet Take by mouth.     venlafaxine XR (EFFEXOR-XR) 75 MG 24 hr capsule Take 1 capsule (75 mg total) by mouth daily with breakfast. 30 capsule 0   ziprasidone (GEODON) 60 MG capsule Take 60 mg by mouth 2 (two) times daily.     No current facility-administered medications for this visit.   Vitals:  03/16/23 0838  BP: 120/65  Pulse: 69  Resp: 14  SpO2: 97%  Weight: 197 lb 2 oz (89.4 kg)   Wt Readings from Last 3 Encounters:  03/16/23 197 lb 2 oz (89.4 kg)  03/03/23 195 lb (88.5 kg)  12/12/22 191 lb (86.6 kg)   Lab Results  Component Value Date   CREATININE 1.38 (H) 03/04/2023   CREATININE 1.66 (H) 03/03/2023    CREATININE 1.44 (H) 09/25/2022   PHYSICAL EXAM:  General:  Well appearing. No resp difficulty HEENT: normal Neck: supple. JVP flat. No lymphadenopathy or thryomegaly appreciated. Cor: PMI normal. Regular rate & rhythm. No rubs, gallops or murmurs. Lungs: clear Abdomen: soft, nontender, nondistended. No hepatosplenomegaly. No bruits or masses.  Extremities: no cyanosis, clubbing, rash, edema Neuro: alert & orientedx3, cranial nerves grossly intact. Moves all 4 extremities w/o difficulty. Affect pleasant.   ECG: ED 03/04/23 showed NSR with 1st degree AV block, HR 87   ASSESSMENT & PLAN:  1: NICM with preserved ejection fraction- - suspect due to HTN/ sleep apnea - NYHA class II - euvolemic today - weighing daily; reminded to call for an overnight weight gain of >2 pounds or a weekly weight gain of >5 pounds - weight up 6 pounds from last visit here 5 months ago - Echo 05/11/17: EF of 55% along with mild AR/ TR/ MR. - Echo 07/31/17: EF of 55-65%.  - Echo 03/25/18: EF of 55-60%.  - Echo 06/08/18: EF of 55-60%.  - Echo 07/25/20: EF of 55-60% along with mild LVH - Echo 07/03/22: EF  65-70% with mild LVH & mild MR.   - not adding salt to his food and trying to watch the sodium content of his food - saw cardiology (Paraschos) 09/24 - continue valsartan 40mg  daily - continue jardiance 25mg  daily (DM dose) - consider adding spironolactone if BP remains stable - BNP 07/10/22 was 20.9  2: HTN- - BP 120/65 - BMP 03/04/23 reviewed and showed sodium 139, potassium 4.2, creatinine 1.38 and GFR 57 - saw PCP Sampson Goon) 09/24  3: Diabetes- - A1c 11/18/22 was 6.2% - saw endocrinologist Val EagleElveria Rising) 07/24 - saw nephrologist Cherly Hensen) 07/24  4: Depression- - stable at this time - did have psych admission back in October 2023  5: OSA- - says that he hasn't worn his CPAP in ~ 6 months due to air leakage around his mask - instructed him to call the respiratory company to have them come out and  evaluate the equipment  Return in 6 months, sooner if needed.

## 2023-03-23 ENCOUNTER — Emergency Department
Admission: EM | Admit: 2023-03-23 | Discharge: 2023-03-23 | Disposition: A | Discharge status: Home / Self Care | Payer: 59 | Attending: Emergency Medicine | Admitting: Emergency Medicine

## 2023-03-23 ENCOUNTER — Other Ambulatory Visit: Payer: Self-pay

## 2023-03-23 DIAGNOSIS — I110 Hypertensive heart disease with heart failure: Secondary | ICD-10-CM | POA: Insufficient documentation

## 2023-03-23 DIAGNOSIS — I509 Heart failure, unspecified: Secondary | ICD-10-CM | POA: Diagnosis not present

## 2023-03-23 DIAGNOSIS — R531 Weakness: Secondary | ICD-10-CM | POA: Insufficient documentation

## 2023-03-23 DIAGNOSIS — E1165 Type 2 diabetes mellitus with hyperglycemia: Secondary | ICD-10-CM | POA: Insufficient documentation

## 2023-03-23 DIAGNOSIS — R42 Dizziness and giddiness: Secondary | ICD-10-CM | POA: Diagnosis not present

## 2023-03-23 DIAGNOSIS — R739 Hyperglycemia, unspecified: Secondary | ICD-10-CM

## 2023-03-23 LAB — BASIC METABOLIC PANEL
Anion gap: 11 (ref 5–15)
BUN: 25 mg/dL — ABNORMAL HIGH (ref 8–23)
CO2: 20 mmol/L — ABNORMAL LOW (ref 22–32)
Calcium: 8.9 mg/dL (ref 8.9–10.3)
Chloride: 105 mmol/L (ref 98–111)
Creatinine, Ser: 1.41 mg/dL — ABNORMAL HIGH (ref 0.61–1.24)
GFR, Estimated: 56 mL/min — ABNORMAL LOW (ref >60)
Glucose, Bld: 344 mg/dL — ABNORMAL HIGH (ref 70–99)
Potassium: 4.3 mmol/L (ref 3.5–5.1)
Sodium: 136 mmol/L (ref 135–145)

## 2023-03-23 LAB — CBC
HCT: 42.4 % (ref 39.0–52.0)
Hemoglobin: 14.4 g/dL (ref 13.0–17.0)
MCH: 31.8 pg (ref 26.0–34.0)
MCHC: 34 g/dL (ref 30.0–36.0)
MCV: 93.6 fL (ref 80.0–100.0)
Platelets: 200 10*3/uL (ref 150–400)
RBC: 4.53 MIL/uL (ref 4.22–5.81)
RDW: 12.8 % (ref 11.5–15.5)
WBC: 8.8 10*3/uL (ref 4.0–10.5)
nRBC: 0 % (ref 0.0–0.2)

## 2023-03-23 LAB — CBG MONITORING, ED: Glucose-Capillary: 134 mg/dL — ABNORMAL HIGH (ref 70–99)

## 2023-03-23 MED ORDER — SODIUM CHLORIDE 0.9 % IV BOLUS
1000.0000 mL | Freq: Once | INTRAVENOUS | Status: AC
  Administered 2023-03-23: 1000 mL via INTRAVENOUS

## 2023-03-23 NOTE — Discharge Instructions (Signed)
You were seen in the emergency department today for your weakness.  Your blood sugar was elevated.  Please make sure you are staying hydrated and taking her medications as directed.  Follow-up your primary care doctor for further evaluation.  Return to the ER for new or worsening symptoms.

## 2023-03-23 NOTE — ED Triage Notes (Signed)
Pt to ED [facility] from home for generalized weakness. States thinks he is dehydrated because has not been drinking water, does not like the taste of it. Denies pain.  +DM, took meds this am

## 2023-03-23 NOTE — ED Triage Notes (Signed)
Ems for weakness.    says he takes meds.  Fsbs 363.  100% ra. 81.  106/61.

## 2023-03-23 NOTE — ED Provider Notes (Signed)
[facility] Provider Note    Event Date/Time   First MD Initiated Contact with Patient 03/23/23 1325     (approximate)   History   Weakness   HPI  Danny Cook. is a 62 year old male with history of hypertension, CHF, diabetes presenting to the emergency department for evaluation of weakness.  Patient states he does not like the taste of water and has not been drinking much over the past few days.  This morning, started drinking some propel but as he was walking to [employer] noticed that he felt generally weak.  Does report some lightheadedness.  No syncope.  No headache.  No fevers or chills.  No chest pain, shortness of breath, nausea, vomiting, abdominal pain, diarrhea, dysuria.  At the time of my evaluation, reports some mild ongoing weakness but feels significantly improved.  Does report he has been compliant with his diabetes medications.  Reviewed echo from January of this year, LVEF 65 to 70% with grade 1 diastolic dysfunction.    Physical Exam   Triage Vital Signs: ED Triage Vitals  Encounter Vitals Group     BP 03/23/23 1046 104/67     Systolic BP Percentile --      Diastolic BP Percentile --      Pulse Rate 03/23/23 1046 93     Resp 03/23/23 1046 18     Temp 03/23/23 1046 97.7 F (36.5 C)     Temp src --      SpO2 03/23/23 1046 95 %     Weight 03/23/23 1047 195 lb (88.5 kg)     Height 03/23/23 1047 5\' 7"  (1.702 m)     Head Circumference --      Peak Flow --      Pain Score 03/23/23 1047 0     Pain Loc --      Pain Education --      Exclude from Growth Chart --     Most recent vital signs: Vitals:   03/23/23 1046 03/23/23 1513  BP: 104/67 136/75  Pulse: 93 65  Resp: 18 17  Temp: 97.7 F (36.5 C) 97.8 F (36.6 C)  SpO2: 95% 96%     General: Awake, interactive  CV:  Regular rate, good peripheral perfusion.  Resp:  Lungs clear, unlabored respirations.  Abd:  Soft, nondistended.  Neuro:  Alert and oriented,  normal extraocular movements, symmetric facial movement, sensation intact over bilateral upper and lower extremities with 5 out of 5 strength.  Normal finger-to-nose testing.   ED Results / Procedures / Treatments   Labs (all labs ordered are listed, but only abnormal results are displayed) Labs Reviewed  BASIC METABOLIC PANEL - Abnormal; Notable for the following components:      Result Value   CO2 20 (*)    Glucose, Bld 344 (*)    BUN 25 (*)    Creatinine, Ser 1.41 (*)    GFR, Estimated 56 (*)    All other components within normal limits  CBG MONITORING, ED - Abnormal; Notable for the following components:   Glucose-Capillary 134 (*)    All other components within normal limits  CBC     EKG EKG independently reviewed interpreted by myself (ER attending) demonstrates:  EKG demonstrates sinus rhythm at a rate of 95, PR 226, QRS 86, QTc 469, no acute ST changes  RADIOLOGY Imaging independently reviewed and interpreted by myself demonstrates:    PROCEDURES:  Critical Care performed: No  Procedures  MEDICATIONS ORDERED IN ED: Medications  sodium chloride 0.9 % bolus 1,000 mL (1,000 mLs Intravenous New Bag/Given 03/23/23 1419)     IMPRESSION / MDM / ASSESSMENT AND PLAN / ED COURSE  I reviewed the triage vital signs and the nursing notes.  Differential diagnosis includes, but is not limited to, anemia, electrolyte abnormality, hyperglycemia with or without DKA, arrhythmia, no headache, focal deficits to suggest acute intracranial process  Patient's presentation is most consistent with acute presentation with potential threat to life or bodily function.  62 year old male presenting to the emergency department for evaluation of weakness.  Labs notable for hyperglycemia with slightly low bicarb, normal anion gap, not consistent with DKA.  CBC reassuring.  Patient significantly improved at the time of my initial evaluation.  Will give the patient some IV fluids and plan for  reevaluation.  After IV fluids, patient reported significant improvement in his symptoms.  His repeat CBG was improved to 132.  He was able to ambulate in the ER without recurrent lightheadedness.  He is comfortable discharge home.  Strict return precautions provided.      FINAL CLINICAL IMPRESSION(S) / ED DIAGNOSES   Final diagnoses:  Generalized weakness  Hyperglycemia     Rx / DC Orders   ED Discharge Orders     None        Note:  This document was prepared using Dragon voice recognition software and may include unintentional dictation errors.   Trinna Post, MD 03/23/23 [phone]

## 2023-08-21 ENCOUNTER — Telehealth: Payer: Self-pay | Admitting: Family

## 2023-08-21 NOTE — Telephone Encounter (Signed)
Lvm to confirm appt for 08/24/23

## 2023-08-21 NOTE — Progress Notes (Unsigned)
 Advanced Heart Failure Clinic Note   Danny Cook, Teena Irani, MD (last seen 01/25)) Primary Cardiologist: Marcina Millard, MD (last seen 09/24)  Chief Complaint: fatigue  HPI:   [name] is a 63 y/o male with a history of DM, hyperlipidemia, HTN, CKD, HIV (since 1990's), schizophrenia, obstructive sleep apnea, anxiety, anemia, depression, kaposi's sarcoma, osteoarthritis and chronic heart failure.   Admitted 11/03/2020 for 48-hour episode of weakness, status post lithotripsy 2 days prior. ECG revealed premature atrial contractions and intermittent second-degree AV block.   Patient wore a 7-day Holter monitor starting on 01/23/2021, which revealed predominant sinus rhythm with a mean heart rate of 65 bpm. There were occasional premature atrial contractions and premature ventricular contractions present. There were infrequent dropped beats due to Mobitz 2 second-degree AV block. There were occasional episodes of SVT, the longest lasting 6 minutes and 49 seconds at a rate of 166 bpm.   Was in the ED 12/03/2021 for chief complaint of lightheadedness with chest discomfort. ECG revealed sinus bradycardia with first-degree AV block without ischemic ST-T wave changes. Troponin was negative (8, 9). Symptoms were felt to be due to hypoglycemia with capillary blood glucose of 46.   Was in the ED 05/18/22 due to dizziness for 2 days along with possible thrush on his tongue. IVF given. Head CT negative. Had a psych admission in October along with 3 other ED visits in October. Was in the ED 09/25/22 due to dizziness/ fatigue thought to be due to dehydration. Found to be covid +. Was in the ED twice 09/24 but both times left without being seen. Was in the ED 03/23/23 due to weakness after not drinking much water over prior 2 days. Found to be hyperglycemic, IVF given and improvement of symptoms.   Echo 06/08/18: EF of 55-60%.  Echo 07/25/20: EF of 55-60% along with mild LVH Echo 07/03/22: EF  65-70% with mild  LVH & mild MR.   Echo 05/20/23: EF >55% with Grade II DD, normal RV, mild AR  He presents today for a HF follow-up visit with a chief complaint of moderate fatigue. Has associated lightheadedness, weakness, constipation and nausea along with this. He was in the ED last night due to constipation and has to have lactulose and senna delivered from his pharmacy. Feels like he's been eating / hydrating well. Denies shortness of breath, chest pain, cough, palpitations or pedal edema. He says that he feels like he's sleeping well on 1 pillow. Not taking valsartan due to hypotension.       Not using CPAP machine due to air leakage around his mask. Estimates that he hasn't used it in almost a year. Says that his last sleep study was "years ago" and he thinks his CPAP equipment is >28 years old.    ROS: All systems negative except as listed in HPI, PMH and Problem List.  SH:  Social History   Socioeconomic History   Marital status: Single    Spouse name: Not on file   Number of children: Not on file   Years of education: 11   Highest education level: GED or equivalent  Occupational History   Not on file  Tobacco Use   Smoking status: Never   Smokeless tobacco: Never  Vaping Use   Vaping status: Never Used  Substance and Sexual Activity   Alcohol use: No   Drug use: No    Comment: denies street drugs   Sexual activity: Never  Other Topics Concern   Not on file  Social History Narrative   Not on file   Social Drivers of Health   Financial Resource Strain: Low Risk  (07/27/2023)   Received from [facility]   Overall Financial Resource Strain (CARDIA)    Difficulty of Paying Living Expenses: Not hard at all  Food Insecurity: No Food Insecurity (07/27/2023)   Received from [facility]   Hunger Vital Sign    Ran Out of Food in the Last Year: Never true    Worried About Running Out of Food in the Last Year: Never true  Transportation Needs: No  Transportation Needs (07/27/2023)   Received from [facility]   PRAPARE - Transportation    Lack of Transportation (Non-Medical): No    In the past 12 months, has lack of transportation kept you from medical appointments or from getting medications?: No  Physical Activity: Inactive (08/12/2017)   Exercise Vital Sign    Days of Exercise per Week: 0 days    Minutes of Exercise per Session: 0 min  Stress: Stress Concern Present (08/12/2017)   [employer] of Occupational Health - Occupational Stress Questionnaire    Feeling of Stress : To some extent  Social Connections: Moderately Isolated (08/12/2017)   Social Connection and Isolation Panel [NHANES]    Frequency of Communication with Friends and Family: More than three times a week    Frequency of Social Gatherings with Friends and Family: Three times a week    Attends Religious Services: Never    Active Member of Clubs or Organizations: No    Attends Banker Meetings: Never    Marital Status: Never married  Intimate Partner Violence: Not At Risk (07/13/2022)   Humiliation, Afraid, Rape, and Kick questionnaire    Fear of Current or Ex-Partner: No    Emotionally Abused: No    Physically Abused: No    Sexually Abused: No    FH:  Family History  Problem Relation Age of Onset   Heart attack Mother    Diabetes Mellitus II Mother    Mental illness Mother    CAD Mother    Heart attack Father    CAD Father    Hypertension Father     Past Medical History:  Diagnosis Date   Anemia    Anxiety    Arthritis    CHF (congestive heart failure) (HCC)    Chronic kidney disease    Renal Insufficiency Syndrome; Glomerulosclerosis 2013   Depressed    Diabetes mellitus without complication (HCC)    GERD (gastroesophageal reflux disease)    Headache    High cholesterol    History of kidney stones    HIV (human immunodeficiency virus infection) (HCC)    Hypertension    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HCC)     Myocardial infarction (HCC)    Paranoid disorder (HCC)    Pneumonia    Schizophrenia, paranoid (HCC)    Sleep apnea    Stroke (HCC)    mini TIA   TIA (transient ischemic attack)     Current Outpatient Medications  Medication Sig Dispense Refill   abacavir-dolutegravir-lamiVUDine (TRIUMEQ) 600-50-300 MG tablet Take 1 tablet by mouth at bedtime. 30 tablet 6   albuterol (PROVENTIL HFA;VENTOLIN HFA) 108 (90 Base) MCG/ACT inhaler Inhale 2 puffs into the lungs every 6 (six) hours as needed for wheezing or shortness of breath. 1 Inhaler 2   aspirin EC 81 MG tablet Take 81 mg by mouth daily. Swallow whole.     beclomethasone (  QVAR) 40 MCG/ACT inhaler Inhale 2 puffs into the lungs 2 (two) times daily.     benztropine (COGENTIN) 0.5 MG tablet Take 3 tablets by mouth daily.     brimonidine-timolol (COMBIGAN) 0.2-0.5 % ophthalmic solution Place 1 drop into both eyes every 12 (twelve) hours.     diazepam (VALIUM) 2 MG tablet Take 2 mg by mouth 3 (three) times daily.     dicyclomine (BENTYL) 10 MG capsule Take 10 mg by mouth 4 (four) times daily before meals and nightly     divalproex (DEPAKOTE ER) 500 MG 24 hr tablet Take 500 mg by mouth daily.     docusate sodium (COLACE) 250 MG capsule Take 250 mg by mouth 2 (two) times daily.     Fluocinolone Acetonide 0.01 % OIL Place 2-4 drops in ear(s) every other day. At bedtime for dry skin     fluticasone (FLOVENT HFA) 220 MCG/ACT inhaler Inhale 2 puffs into the lungs 2 (two) times daily. Rinse out mouth afterwards 1 Inhaler 12   gabapentin (NEURONTIN) 300 MG capsule Take 2 capsules (600 mg total) by mouth every 8 (eight) hours. 180 capsule 0   gemfibrozil (LOPID) 600 MG tablet Take 600 mg by mouth 2 (two) times daily before a meal.     glimepiride (AMARYL) 4 MG tablet Take 4 mg by mouth daily with breakfast.     glycopyrrolate (ROBINUL) 2 MG tablet Take 1 mg by mouth 2 (two) times daily.     iloperidone (FANAPT) 4 MG TABS tablet Take 1 tablet (4 mg total) by  mouth every 12 (twelve) hours. 60 tablet 0   insulin lispro (HUMALOG) 100 UNIT/ML KwikPen Inject 4 Units into the skin 3 (three) times daily.     JARDIANCE 25 MG TABS tablet Take 25 mg by mouth daily.     meclizine (ANTIVERT) 25 MG tablet Take 1 tablet (25 mg total) by mouth 3 (three) times daily as needed for dizziness. 30 tablet 0   memantine (NAMENDA) 10 MG tablet Take 1 tablet by mouth 2 (two) times daily.     mirtazapine (REMERON) 30 MG tablet Take 1 tablet (30 mg total) by mouth at bedtime. 30 tablet 0   mometasone (ELOCON) 0.1 % ointment Apply 1 application  topically See admin instructions. Apply 4 drops to ear canal at bedtime every other night for itching and dry skin.     nitroGLYCERIN (NITROSTAT) 0.4 MG SL tablet Place 1 tablet (0.4 mg total) under the tongue every 5 (five) minutes as needed for chest pain. 20 tablet 5   omega-3 acid ethyl esters (LOVAZA) 1 g capsule Take 1 capsule by mouth every morning.     omeprazole (PRILOSEC) 40 MG capsule Take 40 mg by mouth 2 (two) times daily.     ONETOUCH VERIO test strip 4 (four) times daily.     OZEMPIC, 1 MG/DOSE, 4 MG/3ML SOPN Inject 2 mg into the skin every Monday.     rosuvastatin (CRESTOR) 40 MG tablet Take 40 mg by mouth daily.      senna (SENOKOT) 8.6 MG TABS tablet Take 1 tablet by mouth daily.     sucralfate (CARAFATE) 1 g tablet Take 1 tablet by mouth 4 (four) times daily.     tamsulosin (FLOMAX) 0.4 MG CAPS capsule Take 0.4 mg by mouth daily.     ULTICARE MINI PEN NEEDLES 31G X 6 MM MISC See admin instructions.     valsartan (DIOVAN) 40 MG tablet Take by mouth.  venlafaxine XR (EFFEXOR-XR) 75 MG 24 hr capsule Take 1 capsule (75 mg total) by mouth daily with breakfast. 30 capsule 0   ziprasidone (GEODON) 60 MG capsule Take 60 mg by mouth 2 (two) times daily.     No current facility-administered medications for this visit.   Vitals:   08/24/23 0826  BP: 103/63  Pulse: 67  SpO2: 97%  Weight: 204 lb (92.5 kg)   Wt  Readings from Last 3 Encounters:  08/24/23 204 lb (92.5 kg)  08/23/23 207 lb (93.9 kg)  03/23/23 195 lb (88.5 kg)   Lab Results  Component Value Date   CREATININE 1.41 (H) 03/23/2023   CREATININE 1.38 (H) 03/04/2023   CREATININE 1.66 (H) 03/03/2023    PHYSICAL EXAM:  General: appears tired. No resp difficulty HEENT: normal Neck: supple, no JVD Cor: Regular rhythm, rate. No rubs, gallops or murmurs Lungs: clear Abdomen: soft, tender to palpation, nondistended. Extremities: no cyanosis, clubbing, rash, edema Neuro: alert & oriented X 3. Moves all 4 extremities w/o difficulty. Affect pleasant   ECG: not done   ASSESSMENT & PLAN:  1: NICM with preserved ejection fraction- - suspect due to HTN/ sleep apnea - NYHA class III - euvolemic today - weighing daily; reminded to call for an overnight weight gain of >2 pounds or a weekly weight gain of >5 pounds - weight up 7 pounds from last visit here 5 months ago - Echo 07/25/20: EF of 55-60% along with mild LVH - Echo 07/03/22: EF  65-70% with mild LVH & mild MR.   - Echo 05/20/23: EF >55% with Grade II DD, normal RV, mild AR - not adding salt to his food and trying to watch the sodium content of his food - saw cardiology (Paraschos) 09/24 - continue jardiance 25mg  daily (DM dose) - valsartan has been stopped due to hypotension - current BP too low for MRA - BNP 07/10/22 was 20.9  2: HTN- - BP 103/63 - saw PCP Sampson Goon) 01/25 - CMP 07/27/23 reviewed and showed sodium 144, potassium 4.4, creatinine 1.4 and GFR 56 - BMET today  3: Diabetes- - A1c 07/27/23 reviewed and was 8.3% - saw endocrinologist Val EagleElveria Rising) 02/25 - saw nephrologist Cherly Hensen) 07/24  4: OSA- - says that he hasn't worn his CPAP in ~ 1 year due to air leakage around his mask - will get sleep study repeated, Itamar if approved by insurance, in lab sleep study if not.    Return in 1 month, sooner if needed.   Delma Freeze, FNP 08/21/23

## 2023-08-23 ENCOUNTER — Other Ambulatory Visit: Payer: Self-pay

## 2023-08-23 ENCOUNTER — Emergency Department
Admission: EM | Admit: 2023-08-23 | Discharge: 2023-08-23 | Disposition: A | Discharge status: Home / Self Care | Payer: 59 | Attending: Emergency Medicine | Admitting: Emergency Medicine

## 2023-08-23 DIAGNOSIS — K5900 Constipation, unspecified: Secondary | ICD-10-CM | POA: Insufficient documentation

## 2023-08-23 DIAGNOSIS — R110 Nausea: Secondary | ICD-10-CM | POA: Insufficient documentation

## 2023-08-23 MED ORDER — LACTULOSE 10 GM/15ML PO SOLN: 20.0000 g | Freq: Three times a day (TID) | ORAL | 0 refills | Status: DC

## 2023-08-23 MED ORDER — SENNOSIDES-DOCUSATE SODIUM 8.6-50 MG PO TABS: 1.0000 | ORAL_TABLET | Freq: Every day | ORAL | 0 refills | Status: DC

## 2023-08-23 NOTE — Discharge Instructions (Addendum)
 You have been diagnosed with constipation.  Please take MiraLAX 2 caps in a glass of water 2 times per day until you can pass stool.  Continue taking 1 glass of water with 1 cap of MiraLAX every day.  Please take senna docusate 1 tablet by mouth daily.  Please follow the high fiber diet.  This come back to ED or go to your PCP if you have new symptoms or symptoms worsen.

## 2023-08-23 NOTE — ED Triage Notes (Signed)
 Pt to ED [facility] from home for constipation since yesterday Took milk of magnesia and stool softener today, then had BM as EMS was arriving. Had to strain to have BM.   Pt was having abdominal discomfort yesterday and this AM until had BM. Pt states he wants to know why he is constipated. Stools are dark brown (not black or tarry).  VS: 144/81, 84 HR, 95% RA BG sensor to L arm, hx diabetes, BG has been in 200s. Will start new insulin next Wed  In NAD

## 2023-08-23 NOTE — ED Provider Notes (Signed)
 [facility] Provider Note    Event Date/Time   First MD Initiated Contact with Patient 08/23/23 1722     (approximate)   History   Constipation   HPI  Danny Cook. is a 63 y.o. male   who presents today with history of constipation in the last 24 hours.  Patient took a look for her nausea and a stool softener having a hard bowel movement.  Patient states first episode.  Last colonoscopy 2022 within normal limits.  Patient denies blood in the stools.  Patient states no eating vegetables or fiber.     Physical Exam   Triage Vital Signs: ED Triage Vitals  Encounter Vitals Group     BP 08/23/23 1604 127/75     Systolic BP Percentile --      Diastolic BP Percentile --      Pulse Rate 08/23/23 1604 81     Resp 08/23/23 1604 20     Temp 08/23/23 1604 98.4 F (36.9 C)     Temp Source 08/23/23 1604 Oral     SpO2 08/23/23 1604 95 %     Weight 08/23/23 1602 207 lb (93.9 kg)     Height 08/23/23 1602 5\' 7"  (1.702 m)     Head Circumference --      Peak Flow --      Pain Score 08/23/23 1605 0     Pain Loc --      Pain Education --      Exclude from Growth Chart --     Most recent vital signs: Vitals:   08/23/23 1604  BP: 127/75  Pulse: 81  Resp: 20  Temp: 98.4 F (36.9 C)  SpO2: 95%     Constitutional: Alert , nondistressed Eyes: Conjunctivae are normal.  Head: Atraumatic. Nose: No congestion/rhinnorhea. Mouth/Throat: Mucous membranes are dry Neck: Painless ROM.  Cardiovascular:   Good peripheral circulation. Respiratory: Normal respiratory effort.  No retractions.  Gastrointestinal: Skin without scars, bowel sounds positive. Soft and nontender.  Distended and tympanic.  McBurney negative Rovsing negative rebound negative Musculoskeletal:  no deformity Neurologic:  MAE spontaneously. No gross focal neurologic deficits are appreciated.  Skin:  Skin is warm, dry and intact. No rash noted. Psychiatric: Mood and affect are normal.  Speech and behavior are normal.    ED Results / Procedures / Treatments   Labs (all labs ordered are listed, but only abnormal results are displayed) Labs Reviewed - No data to display   EKG    RADIOLOGY     PROCEDURES:  Critical Care performed:   Procedures   MEDICATIONS ORDERED IN ED: Medications - No data to display   IMPRESSION / MDM / ASSESSMENT AND PLAN / ED COURSE  I reviewed the triage vital signs and the nursing notes.  Differential diagnosis includes, but is not limited to, constipation, colitis, medicine side effect  Patient's presentation is most consistent with acute, uncomplicated illness.   Patient's diagnosis is consistent with acute constipation.  I did review the patient's allergies and medications. Patient will be discharged home with prescriptions for the legs. Patient is to follow up with PCP as needed or otherwise directed. Patient is given ED precautions to return to the ED for any worsening or new symptoms. Discussed plan of care with patient, answered all of patient's questions, Patient agreeable to plan of care. Advised patient to take medications according to the instructions on the label. Discussed possible side effects of new medications. Patient verbalized  understanding.     FINAL CLINICAL IMPRESSION(S) / ED DIAGNOSES   Final diagnoses:  Constipation, unspecified constipation type     Rx / DC Orders   ED Discharge Orders          Ordered    lactulose (CHRONULAC) 10 GM/15ML solution  3 times daily        08/23/23 1805    senna-docusate (SENOKOT-S) 8.6-50 MG tablet  Daily        08/23/23 1805             Note:  This document was prepared using Dragon voice recognition software and may include unintentional dictation errors.   Gladys Damme, PA-C 08/23/23 1806    Merwyn Katos, MD 08/23/23 [phone]

## 2023-08-24 ENCOUNTER — Encounter (INDEPENDENT_AMBULATORY_CARE_PROVIDER_SITE_OTHER): Payer: Self-pay | Admitting: Cardiology

## 2023-08-24 ENCOUNTER — Encounter: Payer: Self-pay | Admitting: Family

## 2023-08-24 ENCOUNTER — Ambulatory Visit: Payer: 59 | Attending: Family | Admitting: Family

## 2023-08-24 VITALS — BP 103/63 | HR 67 | Wt 204.0 lb

## 2023-08-24 DIAGNOSIS — Z87442 Personal history of urinary calculi: Secondary | ICD-10-CM | POA: Diagnosis not present

## 2023-08-24 DIAGNOSIS — N189 Chronic kidney disease, unspecified: Secondary | ICD-10-CM | POA: Diagnosis not present

## 2023-08-24 DIAGNOSIS — D631 Anemia in chronic kidney disease: Secondary | ICD-10-CM | POA: Diagnosis not present

## 2023-08-24 DIAGNOSIS — C469 Kaposi's sarcoma, unspecified: Secondary | ICD-10-CM | POA: Diagnosis not present

## 2023-08-24 DIAGNOSIS — I5032 Chronic diastolic (congestive) heart failure: Secondary | ICD-10-CM

## 2023-08-24 DIAGNOSIS — Z794 Long term (current) use of insulin: Secondary | ICD-10-CM

## 2023-08-24 DIAGNOSIS — I428 Other cardiomyopathies: Secondary | ICD-10-CM | POA: Insufficient documentation

## 2023-08-24 DIAGNOSIS — B20 Human immunodeficiency virus [HIV] disease: Secondary | ICD-10-CM | POA: Diagnosis not present

## 2023-08-24 DIAGNOSIS — M1990 Unspecified osteoarthritis, unspecified site: Secondary | ICD-10-CM | POA: Insufficient documentation

## 2023-08-24 DIAGNOSIS — G4733 Obstructive sleep apnea (adult) (pediatric): Secondary | ICD-10-CM | POA: Diagnosis not present

## 2023-08-24 DIAGNOSIS — E1122 Type 2 diabetes mellitus with diabetic chronic kidney disease: Secondary | ICD-10-CM | POA: Diagnosis not present

## 2023-08-24 DIAGNOSIS — F32A Depression, unspecified: Secondary | ICD-10-CM | POA: Insufficient documentation

## 2023-08-24 DIAGNOSIS — R5383 Other fatigue: Secondary | ICD-10-CM | POA: Diagnosis present

## 2023-08-24 DIAGNOSIS — F419 Anxiety disorder, unspecified: Secondary | ICD-10-CM | POA: Insufficient documentation

## 2023-08-24 DIAGNOSIS — R0683 Snoring: Secondary | ICD-10-CM | POA: Diagnosis not present

## 2023-08-24 DIAGNOSIS — Z79899 Other long term (current) drug therapy: Secondary | ICD-10-CM | POA: Insufficient documentation

## 2023-08-24 DIAGNOSIS — N182 Chronic kidney disease, stage 2 (mild): Secondary | ICD-10-CM

## 2023-08-24 DIAGNOSIS — F209 Schizophrenia, unspecified: Secondary | ICD-10-CM | POA: Diagnosis not present

## 2023-08-24 DIAGNOSIS — I130 Hypertensive heart and chronic kidney disease with heart failure and stage 1 through stage 4 chronic kidney disease, or unspecified chronic kidney disease: Secondary | ICD-10-CM | POA: Diagnosis not present

## 2023-08-24 DIAGNOSIS — I10 Essential (primary) hypertension: Secondary | ICD-10-CM

## 2023-08-24 NOTE — Progress Notes (Signed)
 ITAMAR home sleep study given to patient, all instructions explained, waiver signed, and CLOUDPAT registration complete.

## 2023-08-24 NOTE — Patient Instructions (Addendum)
 Go DOWN to LOWER LEVEL (LL) to have your blood work completed inside of [employer] office.  We will only call you if the results are abnormal or if the provider would like to make medication changes.   We will call you to let you know you can complete your sleep study once your insurance has given approval.   Your pin will be [phone].  Your provider has recommended that you have a home sleep study (Itamar Test).  We have provided you with the equipment in our office today. Please go ahead and download the app. DO NOT OPEN OR TAMPER WITH THE BOX UNTIL WE ADVISE YOU TO DO SO. Once insurance has approved the test our office will call you with PIN number and approval to proceed with testing. Once you have completed the test you just dispose of the equipment, the information is automatically uploaded to [place] via blue-tooth technology. If your test is positive for sleep apnea and you need a home CPAP machine you will be contacted by Dr Norris Cross office [facility]) to set this up.

## 2023-08-24 NOTE — Progress Notes (Signed)
 Height:     Weight: BMI:  Today's Date:  STOP BANG RISK ASSESSMENT S (snore) Have you been told that you snore?     YES   T (tired) Are you often tired, fatigued, or sleepy during the day?   YES  O (obstruction) Do you stop breathing, choke, or gasp during sleep? NO   P (pressure) Do you have or are you being treated for high blood pressure? YES   B (BMI) Is your body index greater than 35 kg/m? NO   A (age) Are you 65 years old or older? YES   N (neck) Do you have a neck circumference greater than 16 inches?   NO   G (gender) Are you a male? YES   TOTAL STOP/BANG "YES" ANSWERS 5                                                                       For Office Use Only              Procedure Order Form    YES to 3+ Stop Bang questions OR two clinical symptoms - patient qualifies for WatchPAT (CPT 95800)      Clinical Notes: Will consult Sleep Specialist and refer for management of therapy due to patient increased risk of Sleep Apnea. Ordering a sleep study due to the following two clinical symptoms: Excessive daytime sleepiness G47.10 / Gastroesophageal reflux K21.9 / Nocturia R35.1 / Morning Headaches G44.221 / Difficulty concentrating R41.840 / Memory problems or poor judgment G31.84 / Personality changes or irritability R45.4 / Loud snoring R06.83 / Depression F32.9 / Unrefreshed by sleep G47.8 / Impotence N52.9 / History of high blood pressure R03.0 / Insomnia G47.00

## 2023-08-25 ENCOUNTER — Encounter: Payer: Self-pay | Admitting: Family

## 2023-08-25 LAB — BASIC METABOLIC PANEL
BUN/Creatinine Ratio: 16 (ref 10–24)
BUN: 21 mg/dL (ref 8–27)
CO2: 22 mmol/L (ref 20–29)
Calcium: 9.4 mg/dL (ref 8.6–10.2)
Chloride: 103 mmol/L (ref 96–106)
Creatinine, Ser: 1.28 mg/dL — ABNORMAL HIGH (ref 0.76–1.27)
Glucose: 162 mg/dL — ABNORMAL HIGH (ref 70–99)
Potassium: 4.5 mmol/L (ref 3.5–5.2)
Sodium: 146 mmol/L — ABNORMAL HIGH (ref 134–144)
eGFR: 62 mL/min/{1.73_m2} (ref >59)

## 2023-08-26 ENCOUNTER — Telehealth: Payer: Self-pay

## 2023-08-26 NOTE — Telephone Encounter (Signed)
 Pt called wanting the results of his sleep study. Advised pt that the result had not came in yet.

## 2023-08-28 ENCOUNTER — Ambulatory Visit: Attending: Family

## 2023-08-28 DIAGNOSIS — I5032 Chronic diastolic (congestive) heart failure: Secondary | ICD-10-CM

## 2023-08-28 NOTE — Procedures (Signed)
   SLEEP STUDY REPORT Patient Information Study Date: 08/24/2023 Patient Name: Danny Cook Patient ID: [id number] Birth Date: 1961-03-04 Age: 63 Gender: Male BMI: 32.2 (W=205 lb, H=5' 7'') Stopbang: 5 Referring Physician: Clarisa Kindred, NP  TEST DESCRIPTION: Home sleep apnea testing was completed using the WatchPat, a Type 1 device, utilizing peripheral arterial tonometry (PAT), chest movement, actigraphy, pulse oximetry, pulse rate, body position and snore. AHI was calculated with apnea and hypopnea using valid sleep time as the denominator. RDI includes apneas, hypopneas, and RERAs. The data acquired and the scoring of sleep and all associated events were performed in accordance with the recommended standards and specifications as outlined in the AASM Manual for the Scoring of Sleep and Associated Events 2.2.0 (2015).  FINDINGS: 1. No evidence of Obstructive Sleep Apnea with AHI 3.3/hr. 2. No Central Sleep Apnea. 3. Oxygen desaturations as low as 86%. 4. Mild snoring was present. O2 sats were < 88% for 1.1 minutes. 5. Total sleep time was 5 hrs and 42 min. 6. 19.7% of total sleep time was spent in REM sleep. 7. Shortened sleep onset latency at 5 min. 8. Prolonged REM sleep onset latency at 138 min. 9. Total awakenings were 3.  DIAGNOSIS: Normal study with no significant sleep disordered breathing.  RECOMMENDATIONS: 1. Normal study with no significant sleep disordered breathing.  2. Healthy sleep recommendations include: adequate nightly sleep (normal 7-9 hrs/night), avoidance of caffeine after noon and alcohol near bedtime, and maintaining a sleep environment that is cool, dark and quiet.  3. Weight loss for overweight patients is recommended.  4. Snoring recommendations include: weight loss where appropriate, side sleeping, and avoidance of alcohol before bed.  5. Operation of motor vehicle or dangerous equipment must be avoided when feeling drowsy, excessively  sleepy, or mentally fatigued.  6. An ENT consultation which may be useful for specific causes of and possible treatment of bothersome snoring .  7. Weight loss may be of benefit in reducing the severity of snoring.   Signature: Armanda Magic, MD; [facility]; Diplomat, American Board of Sleep Medicine Electronically Signed: 08/28/2023 3:41:32 PM

## 2023-08-31 ENCOUNTER — Telehealth: Payer: Self-pay

## 2023-08-31 NOTE — Telephone Encounter (Signed)
 Left VM, per DPR. Notified patient that Sleep study did not show significant signs of Sleep Apnea and there is no recommended treatment. Left callback number for questions or concern

## 2023-08-31 NOTE — Telephone Encounter (Signed)
-----   Message from Armanda Magic sent at 08/28/2023  3:43 PM EST ----- Please let patient know that sleep study showed no significant sleep apnea.

## 2023-09-11 ENCOUNTER — Telehealth: Payer: Self-pay | Admitting: Family

## 2023-09-11 NOTE — Telephone Encounter (Signed)
 Lvm to confirm appt 09/14/23

## 2023-09-13 NOTE — Progress Notes (Deleted)
 Advanced Heart Failure Clinic Note   WGN:[id number], Teena Irani, MD (last seen 01/25)) Primary Cardiologist: Marcina Millard, MD (last seen 09/24)  Chief Complaint: fatigue  HPI:   Mr Hausmann is a 63 y/o male with a history of DM, hyperlipidemia, HTN, CKD, HIV (since 1990's), schizophrenia, obstructive sleep apnea, anxiety, anemia, depression, kaposi's sarcoma, osteoarthritis and chronic heart failure.   Admitted 11/03/2020 for 48-hour episode of weakness, status post lithotripsy 2 days prior. ECG revealed premature atrial contractions and intermittent second-degree AV block.   Patient wore a 7-day Holter monitor starting on 01/23/2021, which revealed predominant sinus rhythm with a mean heart rate of 65 bpm. There were occasional premature atrial contractions and premature ventricular contractions present. There were infrequent dropped beats due to Mobitz 2 second-degree AV block. There were occasional episodes of SVT, the longest lasting 6 minutes and 49 seconds at a rate of 166 bpm.   Was in the ED 12/03/2021 for chief complaint of lightheadedness with chest discomfort. ECG revealed sinus bradycardia with first-degree AV block without ischemic ST-T wave changes. Troponin was negative (8, 9). Symptoms were felt to be due to hypoglycemia with capillary blood glucose of 46.   Was in the ED 05/18/22 due to dizziness for 2 days along with possible thrush on his tongue. IVF given. Head CT negative. Had a psych admission in October along with 3 other ED visits in October. Was in the ED 09/25/22 due to dizziness/ fatigue thought to be due to dehydration. Found to be covid +. Was in the ED twice 09/24 but both times left without being seen. Was in the ED 03/23/23 due to weakness after not drinking much water over prior 2 days. Found to be hyperglycemic, IVF given and improvement of symptoms.   Echo 06/08/18: EF of 55-60%.  Echo 07/25/20: EF of 55-60% along with mild LVH Echo 07/03/22: EF  65-70% with mild  LVH & mild MR.   Echo 05/20/23: EF >55% with Grade II DD, normal RV, mild AR  He presents today for a HF follow-up visit with a chief complaint of moderate fatigue. Has associated lightheadedness, weakness, constipation and nausea along with this. He was in the ED last night due to constipation and has to have lactulose and senna delivered from his pharmacy. Feels like he's been eating / hydrating well. Denies shortness of breath, chest pain, cough, palpitations or pedal edema. He says that he feels like he's sleeping well on 1 pillow. Not taking valsartan due to hypotension.       Not using CPAP machine due to air leakage around his mask. Estimates that he hasn't used it in almost a year. Says that his last sleep study was "years ago" and he thinks his CPAP equipment is >67 years old.    ROS: All systems negative except as listed in HPI, PMH and Problem List.  SH:  Social History   Socioeconomic History   Marital status: Single    Spouse name: Not on file   Number of children: Not on file   Years of education: 11   Highest education level: GED or equivalent  Occupational History   Not on file  Tobacco Use   Smoking status: Never   Smokeless tobacco: Never  Vaping Use   Vaping status: Never Used  Substance and Sexual Activity   Alcohol use: No   Drug use: No    Comment: denies street drugs   Sexual activity: Never  Other Topics Concern   Not on file  Social History Narrative   Not on file   Social Drivers of Health   Financial Resource Strain: Low Risk  (07/27/2023)   Received from [facility]   Overall Financial Resource Strain (CARDIA)    Difficulty of Paying Living Expenses: Not hard at all  Food Insecurity: No Food Insecurity (07/27/2023)   Received from [facility]   Hunger Vital Sign    Ran Out of Food in the Last Year: Never true    Worried About Running Out of Food in the Last Year: Never true  Transportation Needs: No  Transportation Needs (07/27/2023)   Received from [facility]   PRAPARE - Transportation    Lack of Transportation (Non-Medical): No    In the past 12 months, has lack of transportation kept you from medical appointments or from getting medications?: No  Physical Activity: Inactive (08/12/2017)   Exercise Vital Sign    Days of Exercise per Week: 0 days    Minutes of Exercise per Session: 0 min  Stress: Stress Concern Present (08/12/2017)   [employer] of Occupational Health - Occupational Stress Questionnaire    Feeling of Stress : To some extent  Social Connections: Moderately Isolated (08/12/2017)   Social Connection and Isolation Panel [NHANES]    Frequency of Communication with Friends and Family: More than three times a week    Frequency of Social Gatherings with Friends and Family: Three times a week    Attends Religious Services: Never    Active Member of Clubs or Organizations: No    Attends Banker Meetings: Never    Marital Status: Never married  Intimate Partner Violence: Not At Risk (07/13/2022)   Humiliation, Afraid, Rape, and Kick questionnaire    Fear of Current or Ex-Partner: No    Emotionally Abused: No    Physically Abused: No    Sexually Abused: No    FH:  Family History  Problem Relation Age of Onset   Heart attack Mother    Diabetes Mellitus II Mother    Mental illness Mother    CAD Mother    Heart attack Father    CAD Father    Hypertension Father     Past Medical History:  Diagnosis Date   Anemia    Anxiety    Arthritis    CHF (congestive heart failure) (HCC)    Chronic kidney disease    Renal Insufficiency Syndrome; Glomerulosclerosis 2013   Depressed    Diabetes mellitus without complication (HCC)    type 2, has continuous monitor   GERD (gastroesophageal reflux disease)    Headache    High cholesterol    History of kidney stones    HIV (human immunodeficiency virus infection) (HCC)    Hypertension     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HCC)    Myocardial infarction (HCC)    Paranoid disorder (HCC)    Pneumonia    Schizophrenia, paranoid (HCC)    Sleep apnea    Stroke (HCC)    mini TIA   TIA (transient ischemic attack)     Current Outpatient Medications  Medication Sig Dispense Refill   abacavir-dolutegravir-lamiVUDine (TRIUMEQ) 600-50-300 MG tablet Take 1 tablet by mouth at bedtime. 30 tablet 6   albuterol (PROVENTIL HFA;VENTOLIN HFA) 108 (90 Base) MCG/ACT inhaler Inhale 2 puffs into the lungs every 6 (six) hours as needed for wheezing or shortness of breath. 1 Inhaler 2   aspirin EC 81 MG tablet Take 81 mg by mouth daily.  Swallow whole.     benztropine (COGENTIN) 0.5 MG tablet Take 3 tablets by mouth daily.     brimonidine-timolol (COMBIGAN) 0.2-0.5 % ophthalmic solution Place 1 drop into both eyes every 12 (twelve) hours.     diazepam (VALIUM) 2 MG tablet Take 2 mg by mouth 3 (three) times daily.     dicyclomine (BENTYL) 10 MG capsule Take 10 mg by mouth 4 (four) times daily before meals and nightly     divalproex (DEPAKOTE ER) 500 MG 24 hr tablet Take 500 mg by mouth daily.     docusate sodium (COLACE) 250 MG capsule Take 250 mg by mouth 2 (two) times daily.     Fluocinolone Acetonide 0.01 % OIL Place 2-4 drops in ear(s) every other day. At bedtime for dry skin     fluticasone (FLOVENT HFA) 220 MCG/ACT inhaler Inhale 2 puffs into the lungs 2 (two) times daily. Rinse out mouth afterwards 1 Inhaler 12   gabapentin (NEURONTIN) 300 MG capsule Take 2 capsules (600 mg total) by mouth every 8 (eight) hours. 180 capsule 0   gemfibrozil (LOPID) 600 MG tablet Take 600 mg by mouth 2 (two) times daily before a meal.     glimepiride (AMARYL) 4 MG tablet Take 4 mg by mouth daily with breakfast.     glycopyrrolate (ROBINUL) 2 MG tablet Take 1 mg by mouth 2 (two) times daily.     iloperidone (FANAPT) 4 MG TABS tablet Take 1 tablet (4 mg total) by mouth every 12 (twelve) hours. 60 tablet 0   insulin lispro  (HUMALOG) 100 UNIT/ML KwikPen Inject 14 Units into the skin 3 (three) times daily.     JARDIANCE 25 MG TABS tablet Take 25 mg by mouth daily.     lactulose (CHRONULAC) 10 GM/15ML solution Take 30 mLs (20 g total) by mouth 3 (three) times daily. (Patient not taking: Reported on 08/24/2023) 236 mL 0   meclizine (ANTIVERT) 25 MG tablet Take 1 tablet (25 mg total) by mouth 3 (three) times daily as needed for dizziness. 30 tablet 0   memantine (NAMENDA) 10 MG tablet Take 1 tablet by mouth 2 (two) times daily.     mirtazapine (REMERON) 30 MG tablet Take 1 tablet (30 mg total) by mouth at bedtime. 30 tablet 0   mometasone (ELOCON) 0.1 % ointment Apply 1 application  topically See admin instructions. Apply 4 drops to ear canal at bedtime every other night for itching and dry skin.     nitroGLYCERIN (NITROSTAT) 0.4 MG SL tablet Place 1 tablet (0.4 mg total) under the tongue every 5 (five) minutes as needed for chest pain. 20 tablet 5   omega-3 acid ethyl esters (LOVAZA) 1 g capsule Take 1 capsule by mouth every morning.     omeprazole (PRILOSEC) 40 MG capsule Take 40 mg by mouth 2 (two) times daily.     ONETOUCH VERIO test strip 4 (four) times daily.     rosuvastatin (CRESTOR) 40 MG tablet Take 40 mg by mouth daily.      senna-docusate (SENOKOT-S) 8.6-50 MG tablet Take 1 tablet by mouth daily. (Patient not taking: Reported on 08/24/2023) 30 tablet 0   sucralfate (CARAFATE) 1 g tablet Take 1 tablet by mouth 4 (four) times daily.     tamsulosin (FLOMAX) 0.4 MG CAPS capsule Take 0.4 mg by mouth daily.     ULTICARE MINI PEN NEEDLES 31G X 6 MM MISC See admin instructions.     valsartan (DIOVAN) 40 MG tablet Take by  mouth. (Patient not taking: Reported on 08/24/2023)     venlafaxine XR (EFFEXOR-XR) 75 MG 24 hr capsule Take 1 capsule (75 mg total) by mouth daily with breakfast. 30 capsule 0   ziprasidone (GEODON) 60 MG capsule Take 60 mg by mouth 2 (two) times daily.     No current facility-administered medications  for this visit.   There were no vitals filed for this visit.  Wt Readings from Last 3 Encounters:  08/24/23 204 lb (92.5 kg)  08/23/23 207 lb (93.9 kg)  03/23/23 195 lb (88.5 kg)   Lab Results  Component Value Date   CREATININE 1.28 (H) 08/24/2023   CREATININE 1.41 (H) 03/23/2023   CREATININE 1.38 (H) 03/04/2023    PHYSICAL EXAM:  General: appears tired. No resp difficulty HEENT: normal Neck: supple, no JVD Cor: Regular rhythm, rate. No rubs, gallops or murmurs Lungs: clear Abdomen: soft, tender to palpation, nondistended. Extremities: no cyanosis, clubbing, rash, edema Neuro: alert & oriented X 3. Moves all 4 extremities w/o difficulty. Affect pleasant   ECG: not done   ASSESSMENT & PLAN:  1: NICM with preserved ejection fraction- - suspect due to HTN/ sleep apnea - NYHA class III - euvolemic today - weighing daily; reminded to call for an overnight weight gain of >2 pounds or a weekly weight gain of >5 pounds - weight up 7 pounds from last visit here 5 months ago - Echo 07/25/20: EF of 55-60% along with mild LVH - Echo 07/03/22: EF  65-70% with mild LVH & mild MR.   - Echo 05/20/23: EF >55% with Grade II DD, normal RV, mild AR - not adding salt to his food and trying to watch the sodium content of his food - saw cardiology (Paraschos) 09/24 - continue jardiance 25mg  daily (DM dose) - valsartan has been stopped due to hypotension - current BP too low for MRA - BNP 07/10/22 was 20.9  2: HTN- - BP 103/63 - saw PCP Sampson Goon) 01/25 - CMP 07/27/23 reviewed and showed sodium 144, potassium 4.4, creatinine 1.4 and GFR 56 - BMET today  3: Diabetes- - A1c 07/27/23 reviewed and was 8.3% - saw endocrinologist Val EagleElveria Rising) 02/25 - saw nephrologist Cherly Hensen) 07/24  4: OSA- - says that he hasn't worn his CPAP in ~ 1 year due to air leakage around his mask - will get sleep study repeated, [place] if approved by insurance, in lab sleep study if not.    Return in 1 month,  sooner if needed.   Delma Freeze, FNP 09/13/23

## 2023-09-14 ENCOUNTER — Encounter: Payer: 59 | Admitting: Family

## 2023-09-25 ENCOUNTER — Telehealth: Payer: Self-pay | Admitting: Family

## 2023-09-25 NOTE — Progress Notes (Signed)
 [facility] Note    [id number], Teena Irani, MD (last seen 03/25) Primary Cardiologist: Marcina Millard, MD (last seen 03/25)  Chief Complaint: HF visit  HPI:   Danny Cook is a 63 y/o male with a history of T2DM, hyperlipidemia, HTN, CKD, HIV (since 1990's), schizophrenia, PSVT, obstructive sleep apnea, anxiety, anemia, depression, kaposi's sarcoma, osteoarthritis and chronic heart failure.   Echo 06/08/18: EF of 55-60%.  Echo 07/25/20: EF of 55-60% along with mild LVH  Admitted 11/03/2020 for 48-hour episode of weakness, status post lithotripsy 2 days prior. ECG revealed premature atrial contractions and intermittent second-degree AV block.   Patient wore a 7-day Holter monitor starting on 01/23/2021, which revealed predominant sinus rhythm with a mean heart rate of 65 bpm. There were occasional premature atrial contractions and premature ventricular contractions present. There were infrequent dropped beats due to Mobitz 2 second-degree AV block. There were occasional episodes of SVT, the longest lasting 6 minutes and 49 seconds at a rate of 166 bpm.   Was in the ED 12/03/2021 for chief complaint of lightheadedness with chest discomfort. ECG revealed sinus bradycardia with first-degree AV block without ischemic ST-T wave changes. Troponin was negative (8, 9). Symptoms were felt to be due to hypoglycemia with capillary blood glucose of 46.   Was in the ED 05/18/22 due to dizziness for 2 days along with possible thrush on his tongue. IVF given. Head CT negative. Had a psych admission in October along with 3 other ED visits in October. Was in the ED 09/25/22 due to dizziness/ fatigue thought to be due to dehydration. Found to be covid +. Was in the ED twice 09/24 but both times left without being seen. Was in the ED 03/23/23 due to weakness after not drinking much water over prior 2 days. Found to be hyperglycemic, IVF given and improvement of symptoms.   Echo 07/03/22: EF   65-70% with mild LVH & mild Danny.   Echo 05/20/23: EF >55% with Grade II DD, normal RV, mild AR  He presents today with a chief complaint of a HF follow-up visit. Currently has no complaints and specifically denies shortness of breath, fatigue, chest pain, cough, palpitations, abdominal distention, pedal edema, dizziness or difficulty sleeping. Reports sleeping well on 1 pillow. Says that his appetite is good and that overall, he feels great!  Did his home sleep study and says that he was told that he no longer needed to wear CPAP equipment.   ROS: All systems negative except as listed in HPI, PMH and Problem List.  SH:  Social History   Socioeconomic History   Marital status: Single    Spouse name: Not on file   Number of children: Not on file   Years of education: 11   Highest education level: GED or equivalent  Occupational History   Not on file  Tobacco Use   Smoking status: Never   Smokeless tobacco: Never  Vaping Use   Vaping status: Never Used  Substance and Sexual Activity   Alcohol use: No   Drug use: No    Comment: denies street drugs   Sexual activity: Never  Other Topics Concern   Not on file  Social History Narrative   Not on file   Social Drivers of Health   Financial Resource Strain: Low Risk  (07/27/2023)   Received from [facility]   Overall Financial Resource Strain (CARDIA)    Difficulty of Paying Living Expenses: Not hard at all  Food  Insecurity: No Food Insecurity (07/27/2023)   Received from [facility]   Hunger Vital Sign    Ran Out of Food in the Last Year: Never true    Worried About Running Out of Food in the Last Year: Never true  Transportation Needs: No Transportation Needs (07/27/2023)   Received from [facility]   PRAPARE - Transportation    Lack of Transportation (Non-Medical): No    In the past 12 months, has lack of transportation kept you from medical appointments or from getting  medications?: No  Physical Activity: Inactive (08/12/2017)   Exercise Vital Sign    Days of Exercise per Week: 0 days    Minutes of Exercise per Session: 0 min  Stress: Stress Concern Present (08/12/2017)   [employer] of Occupational Health - Occupational Stress Questionnaire    Feeling of Stress : To some extent  Social Connections: Moderately Isolated (08/12/2017)   Social Connection and Isolation Panel [NHANES]    Frequency of Communication with Friends and Family: More than three times a week    Frequency of Social Gatherings with Friends and Family: Three times a week    Attends Religious Services: Never    Active Member of Clubs or Organizations: No    Attends Banker Meetings: Never    Marital Status: Never married  Intimate Partner Violence: Not At Risk (07/13/2022)   Humiliation, Afraid, Rape, and Kick questionnaire    Fear of Current or Ex-Partner: No    Emotionally Abused: No    Physically Abused: No    Sexually Abused: No    FH:  Family History  Problem Relation Age of Onset   Heart attack Mother    Diabetes Mellitus II Mother    Mental illness Mother    CAD Mother    Heart attack Father    CAD Father    Hypertension Father     Past Medical History:  Diagnosis Date   Anemia    Anxiety    Arthritis    CHF (congestive heart failure) (HCC)    Chronic kidney disease    Renal Insufficiency Syndrome; Glomerulosclerosis 2013   Depressed    Diabetes mellitus without complication (HCC)    type 2, has continuous monitor   GERD (gastroesophageal reflux disease)    Headache    High cholesterol    History of kidney stones    HIV (human immunodeficiency virus infection) (HCC)    Hypertension    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HCC)    Myocardial infarction (HCC)    Paranoid disorder (HCC)    Pneumonia    Schizophrenia, paranoid (HCC)    Sleep apnea    Stroke (HCC)    mini TIA   TIA (transient ischemic attack)     Current Outpatient Medications   Medication Sig Dispense Refill   abacavir-dolutegravir-lamiVUDine (TRIUMEQ) 600-50-300 MG tablet Take 1 tablet by mouth at bedtime. 30 tablet 6   albuterol (PROVENTIL HFA;VENTOLIN HFA) 108 (90 Base) MCG/ACT inhaler Inhale 2 puffs into the lungs every 6 (six) hours as needed for wheezing or shortness of breath. 1 Inhaler 2   aspirin EC 81 MG tablet Take 81 mg by mouth daily. Swallow whole.     benztropine (COGENTIN) 0.5 MG tablet Take 3 tablets by mouth daily.     brimonidine-timolol (COMBIGAN) 0.2-0.5 % ophthalmic solution Place 1 drop into both eyes every 12 (twelve) hours.     diazepam (VALIUM) 2 MG tablet Take 2 mg by mouth 3 (  three) times daily.     dicyclomine (BENTYL) 10 MG capsule Take 10 mg by mouth 4 (four) times daily before meals and nightly     divalproex (DEPAKOTE ER) 500 MG 24 hr tablet Take 500 mg by mouth daily.     docusate sodium (COLACE) 250 MG capsule Take 250 mg by mouth 2 (two) times daily.     Fluocinolone Acetonide 0.01 % OIL Place 2-4 drops in ear(s) every other day. At bedtime for dry skin     fluticasone (FLOVENT HFA) 220 MCG/ACT inhaler Inhale 2 puffs into the lungs 2 (two) times daily. Rinse out mouth afterwards 1 Inhaler 12   gabapentin (NEURONTIN) 300 MG capsule Take 2 capsules (600 mg total) by mouth every 8 (eight) hours. 180 capsule 0   gemfibrozil (LOPID) 600 MG tablet Take 600 mg by mouth 2 (two) times daily before a meal.     glimepiride (AMARYL) 4 MG tablet Take 4 mg by mouth daily with breakfast.     glycopyrrolate (ROBINUL) 2 MG tablet Take 1 mg by mouth 2 (two) times daily.     iloperidone (FANAPT) 4 MG TABS tablet Take 1 tablet (4 mg total) by mouth every 12 (twelve) hours. 60 tablet 0   insulin lispro (HUMALOG) 100 UNIT/ML KwikPen Inject 14 Units into the skin 3 (three) times daily.     JARDIANCE 25 MG TABS tablet Take 25 mg by mouth daily.     lactulose (CHRONULAC) 10 GM/15ML solution Take 30 mLs (20 g total) by mouth 3 (three) times daily. (Patient  not taking: Reported on 08/24/2023) 236 mL 0   meclizine (ANTIVERT) 25 MG tablet Take 1 tablet (25 mg total) by mouth 3 (three) times daily as needed for dizziness. 30 tablet 0   memantine (NAMENDA) 10 MG tablet Take 1 tablet by mouth 2 (two) times daily.     mirtazapine (REMERON) 30 MG tablet Take 1 tablet (30 mg total) by mouth at bedtime. 30 tablet 0   mometasone (ELOCON) 0.1 % ointment Apply 1 application  topically See admin instructions. Apply 4 drops to ear canal at bedtime every other night for itching and dry skin.     nitroGLYCERIN (NITROSTAT) 0.4 MG SL tablet Place 1 tablet (0.4 mg total) under the tongue every 5 (five) minutes as needed for chest pain. 20 tablet 5   omega-3 acid ethyl esters (LOVAZA) 1 g capsule Take 1 capsule by mouth every morning.     omeprazole (PRILOSEC) 40 MG capsule Take 40 mg by mouth 2 (two) times daily.     ONETOUCH VERIO test strip 4 (four) times daily.     rosuvastatin (CRESTOR) 40 MG tablet Take 40 mg by mouth daily.      senna-docusate (SENOKOT-S) 8.6-50 MG tablet Take 1 tablet by mouth daily. (Patient not taking: Reported on 08/24/2023) 30 tablet 0   sucralfate (CARAFATE) 1 g tablet Take 1 tablet by mouth 4 (four) times daily.     tamsulosin (FLOMAX) 0.4 MG CAPS capsule Take 0.4 mg by mouth daily.     ULTICARE MINI PEN NEEDLES 31G X 6 MM MISC See admin instructions.     valsartan (DIOVAN) 40 MG tablet Take by mouth. (Patient not taking: Reported on 08/24/2023)     venlafaxine XR (EFFEXOR-XR) 75 MG 24 hr capsule Take 1 capsule (75 mg total) by mouth daily with breakfast. 30 capsule 0   ziprasidone (GEODON) 60 MG capsule Take 60 mg by mouth 2 (two) times daily.  No current facility-administered medications for this visit.   Vitals:   09/28/23 0850  BP: 126/71  Pulse: 67  SpO2: 97%  Weight: 204 lb 12.8 oz (92.9 kg)   Wt Readings from Last 3 Encounters:  09/28/23 204 lb 12.8 oz (92.9 kg)  08/24/23 204 lb (92.5 kg)  08/23/23 207 lb (93.9 kg)    Lab Results  Component Value Date   CREATININE 1.28 (H) 08/24/2023   CREATININE 1.41 (H) 03/23/2023   CREATININE 1.38 (H) 03/04/2023    PHYSICAL EXAM:  General: Well appearing. No resp difficulty HEENT: normal Neck: supple, no JVD Cor: Regular rhythm, rate. No rubs, gallops or murmurs Lungs: clear Abdomen: soft, nontender, nondistended. Extremities: no cyanosis, clubbing, rash, edema Neuro: alert & oriented X 3. Moves all 4 extremities w/o difficulty. Affect pleasant   ECG: not done   ASSESSMENT & PLAN:  1: NICM with preserved ejection fraction- - suspect due to HTN/ sleep apnea - NYHA class III - euvolemic today - weighing daily; reminded to call for an overnight weight gain of >2 pounds or a weekly weight gain of >5 pounds - weight unchanged from last visit here 1 month ago - Echo 07/25/20: EF of 55-60% along with mild LVH - Echo 07/03/22: EF  65-70% with mild LVH & mild Danny.   - Echo 05/20/23: EF >55% with Grade II DD, normal RV, mild AR - not adding salt to his food and trying to watch the sodium content of his food - saw cardiology (Paraschos) 03/25 - continue jardiance 25mg  daily (DM dose) - valsartan has been stopped due to hypotension - current BP too low for MRA - BNP 07/10/22 was 20.9  2: HTN- - BP 126/71 - saw PCP Sampson Goon) 03/25 - BMP 08/24/23 reviewed and showed sodium 146, potassium 4.5, creatinine 1.28 and GFR 62  3: Diabetes- - A1c 07/27/23 reviewed and was 8.3% - saw endocrinologist Val EagleElveria Rising) 02/25; returns tomorrow - saw nephrologist Cherly Hensen) 03/25  4: OSA- - home sleep study was normal; no need to resume CPAP   Return in 6 months, sooner if needed.   Delma Freeze, FNP 09/25/23

## 2023-09-25 NOTE — Telephone Encounter (Incomplete)
 Called to confirm/remind patient of their appointment at the [facility] on 09/28/23***.   Appointment:   [x] Confirmed  [] Left mess   [] No answer/No voice mail  [] Phone not in service  Patient reminded to bring all medications and/or complete list.  Confirmed patient has transportation. Gave directions, instructed to utilize valet parking.

## 2023-09-28 ENCOUNTER — Encounter: Payer: Self-pay | Admitting: Family

## 2023-09-28 ENCOUNTER — Ambulatory Visit: Payer: 59 | Attending: Family | Admitting: Family

## 2023-09-28 VITALS — BP 126/71 | HR 67 | Wt 204.8 lb

## 2023-09-28 DIAGNOSIS — M1990 Unspecified osteoarthritis, unspecified site: Secondary | ICD-10-CM | POA: Diagnosis not present

## 2023-09-28 DIAGNOSIS — E7800 Pure hypercholesterolemia, unspecified: Secondary | ICD-10-CM | POA: Diagnosis not present

## 2023-09-28 DIAGNOSIS — G4733 Obstructive sleep apnea (adult) (pediatric): Secondary | ICD-10-CM | POA: Diagnosis not present

## 2023-09-28 DIAGNOSIS — I130 Hypertensive heart and chronic kidney disease with heart failure and stage 1 through stage 4 chronic kidney disease, or unspecified chronic kidney disease: Secondary | ICD-10-CM | POA: Diagnosis not present

## 2023-09-28 DIAGNOSIS — Z8616 Personal history of COVID-19: Secondary | ICD-10-CM | POA: Insufficient documentation

## 2023-09-28 DIAGNOSIS — Z79624 Long term (current) use of inhibitors of nucleotide synthesis: Secondary | ICD-10-CM | POA: Insufficient documentation

## 2023-09-28 DIAGNOSIS — Z21 Asymptomatic human immunodeficiency virus [HIV] infection status: Secondary | ICD-10-CM | POA: Insufficient documentation

## 2023-09-28 DIAGNOSIS — I441 Atrioventricular block, second degree: Secondary | ICD-10-CM | POA: Diagnosis not present

## 2023-09-28 DIAGNOSIS — E1122 Type 2 diabetes mellitus with diabetic chronic kidney disease: Secondary | ICD-10-CM | POA: Insufficient documentation

## 2023-09-28 DIAGNOSIS — Z7951 Long term (current) use of inhaled steroids: Secondary | ICD-10-CM | POA: Diagnosis not present

## 2023-09-28 DIAGNOSIS — N182 Chronic kidney disease, stage 2 (mild): Secondary | ICD-10-CM

## 2023-09-28 DIAGNOSIS — Z833 Family history of diabetes mellitus: Secondary | ICD-10-CM | POA: Insufficient documentation

## 2023-09-28 DIAGNOSIS — Z8249 Family history of ischemic heart disease and other diseases of the circulatory system: Secondary | ICD-10-CM | POA: Insufficient documentation

## 2023-09-28 DIAGNOSIS — Z7982 Long term (current) use of aspirin: Secondary | ICD-10-CM | POA: Insufficient documentation

## 2023-09-28 DIAGNOSIS — I10 Essential (primary) hypertension: Secondary | ICD-10-CM | POA: Diagnosis not present

## 2023-09-28 DIAGNOSIS — I5032 Chronic diastolic (congestive) heart failure: Secondary | ICD-10-CM | POA: Diagnosis not present

## 2023-09-28 DIAGNOSIS — N189 Chronic kidney disease, unspecified: Secondary | ICD-10-CM | POA: Insufficient documentation

## 2023-09-28 DIAGNOSIS — Z794 Long term (current) use of insulin: Secondary | ICD-10-CM | POA: Insufficient documentation

## 2023-09-28 DIAGNOSIS — I110 Hypertensive heart disease with heart failure: Secondary | ICD-10-CM | POA: Diagnosis present

## 2023-09-28 DIAGNOSIS — F200 Paranoid schizophrenia: Secondary | ICD-10-CM | POA: Insufficient documentation

## 2023-09-28 DIAGNOSIS — Z7984 Long term (current) use of oral hypoglycemic drugs: Secondary | ICD-10-CM | POA: Diagnosis not present

## 2023-09-28 DIAGNOSIS — Z79899 Other long term (current) drug therapy: Secondary | ICD-10-CM | POA: Diagnosis not present

## 2023-09-28 NOTE — Patient Instructions (Addendum)
 It was good to see you today!  If you receive a satisfaction survey regarding the Heart Failure Clinic, please take the time to fill it out. This way we can continue to provide excellent care and make any changes that need to be made.    Follow-Up in: Please follow up with the Heart Failure Clinic in 6 months.  At the [facility], you and your health needs are our priority. We have a designated team specialized in the treatment of Heart Failure. This Care Team includes your primary Heart Failure Specialized Cardiologist (physician), Advanced Practice Providers (APPs- Physician Assistants and Nurse Practitioners), and Pharmacist who all work together to provide you with the care you need, when you need it.   You may see any of the following providers on your designated Care Team at your next follow up:  Dr. Arvilla Meres Dr. Marca Ancona Dr. Dorthula Nettles Dr. Theresia Bough Clarisa Kindred, FNP Enos Fling, RPH-CPP  Please be sure to bring in all your medications bottles to every appointment.   Need to Contact us:  If you have any questions or concerns before your next appointment please send [place] a message through [place] or call our office at [phone].    TO LEAVE A MESSAGE FOR THE NURSE SELECT OPTION 2, PLEASE LEAVE A MESSAGE INCLUDING: YOUR NAME DATE OF BIRTH CALL BACK NUMBER REASON FOR CALL**this is important as we prioritize the call backs  YOU WILL RECEIVE A CALL BACK THE SAME DAY AS LONG AS YOU CALL BEFORE 4:00 PM

## 2023-11-03 ENCOUNTER — Emergency Department
Admission: EM | Admit: 2023-11-03 | Discharge: 2023-11-03 | Discharge status: Against Medical Advice | Attending: Emergency Medicine | Admitting: Emergency Medicine

## 2023-11-03 ENCOUNTER — Emergency Department

## 2023-11-03 ENCOUNTER — Other Ambulatory Visit: Payer: Self-pay

## 2023-11-03 DIAGNOSIS — R42 Dizziness and giddiness: Secondary | ICD-10-CM | POA: Diagnosis not present

## 2023-11-03 DIAGNOSIS — Z5321 Procedure and treatment not carried out due to patient leaving prior to being seen by health care provider: Secondary | ICD-10-CM | POA: Diagnosis not present

## 2023-11-03 DIAGNOSIS — R079 Chest pain, unspecified: Secondary | ICD-10-CM | POA: Insufficient documentation

## 2023-11-03 DIAGNOSIS — R531 Weakness: Secondary | ICD-10-CM | POA: Diagnosis present

## 2023-11-03 LAB — URINALYSIS, ROUTINE W REFLEX MICROSCOPIC
Bacteria, UA: NONE SEEN
Bilirubin Urine: NEGATIVE
Glucose, UA: >=500 mg/dL — AB
Hgb urine dipstick: NEGATIVE
Ketones, ur: NEGATIVE mg/dL
Leukocytes,Ua: NEGATIVE
Nitrite: NEGATIVE
Protein, ur: >=300 mg/dL — AB
Specific Gravity, Urine: 1.021 (ref 1.005–1.030)
Squamous Epithelial / HPF: 0 /HPF (ref 0–5)
pH: 8 (ref 5.0–8.0)

## 2023-11-03 LAB — COMPREHENSIVE METABOLIC PANEL WITH GFR
ALT: 19 U/L (ref 0–44)
AST: 21 U/L (ref 15–41)
Albumin: 3.7 g/dL (ref 3.5–5.0)
Alkaline Phosphatase: 90 U/L (ref 38–126)
Anion gap: 9 (ref 5–15)
BUN: 15 mg/dL (ref 8–23)
CO2: 26 mmol/L (ref 22–32)
Calcium: 9.6 mg/dL (ref 8.9–10.3)
Chloride: 102 mmol/L (ref 98–111)
Creatinine, Ser: 1.15 mg/dL (ref 0.61–1.24)
GFR, Estimated: >60 mL/min (ref >60)
Glucose, Bld: 109 mg/dL — ABNORMAL HIGH (ref 70–99)
Potassium: 4 mmol/L (ref 3.5–5.1)
Sodium: 137 mmol/L (ref 135–145)
Total Bilirubin: 0.6 mg/dL (ref 0.0–1.2)
Total Protein: 7 g/dL (ref 6.5–8.1)

## 2023-11-03 LAB — CBC
HCT: 42.6 % (ref 39.0–52.0)
Hemoglobin: 14.5 g/dL (ref 13.0–17.0)
MCH: 31.1 pg (ref 26.0–34.0)
MCHC: 34 g/dL (ref 30.0–36.0)
MCV: 91.4 fL (ref 80.0–100.0)
Platelets: 175 10*3/uL (ref 150–400)
RBC: 4.66 MIL/uL (ref 4.22–5.81)
RDW: 13.1 % (ref 11.5–15.5)
WBC: 8.5 10*3/uL (ref 4.0–10.5)
nRBC: 0 % (ref 0.0–0.2)

## 2023-11-03 LAB — TROPONIN I (HIGH SENSITIVITY): Troponin I (High Sensitivity): 9 ng/L (ref <18)

## 2023-11-03 NOTE — ED Triage Notes (Signed)
 Patient to ED via [facility] from home for weakess/dizziness since yesterday. Worse today. Hx diabetes. Ambulatory with assistance.   Cbg 148 114/64 64 HR 98.1 98% RA

## 2023-11-03 NOTE — ED Notes (Signed)
 No answer when called several times from lobby

## 2023-11-03 NOTE — ED Triage Notes (Addendum)
 Pt here via [facility] with weakness and dizziness. Pt states when he walks he looses his balance and he gets nauseous with some lightheadedness. Pt also c/o sharp pains in his chest as well. Pt states his LKW was Sat.

## 2023-12-11 ENCOUNTER — Ambulatory Visit: Payer: Self-pay | Admitting: Physician Assistant

## 2023-12-18 ENCOUNTER — Ambulatory Visit: Admitting: Physician Assistant

## 2023-12-23 ENCOUNTER — Other Ambulatory Visit: Payer: Self-pay | Admitting: Physician Assistant

## 2023-12-23 DIAGNOSIS — N200 Calculus of kidney: Secondary | ICD-10-CM

## 2023-12-24 ENCOUNTER — Ambulatory Visit (INDEPENDENT_AMBULATORY_CARE_PROVIDER_SITE_OTHER): Admitting: Physician Assistant

## 2023-12-24 ENCOUNTER — Encounter: Payer: Self-pay | Admitting: Physician Assistant

## 2023-12-24 ENCOUNTER — Ambulatory Visit
Admission: RE | Admit: 2023-12-24 | Discharge: 2023-12-24 | Disposition: A | Discharge status: Home / Self Care | Attending: Physician Assistant | Admitting: Physician Assistant

## 2023-12-24 ENCOUNTER — Ambulatory Visit
Admission: RE | Admit: 2023-12-24 | Discharge: 2023-12-24 | Disposition: A | Discharge status: Home / Self Care | Source: Ambulatory Visit | Attending: Physician Assistant | Admitting: Physician Assistant

## 2023-12-24 VITALS — Ht 67.0 in | Wt 204.2 lb

## 2023-12-24 DIAGNOSIS — N200 Calculus of kidney: Secondary | ICD-10-CM | POA: Insufficient documentation

## 2023-12-24 DIAGNOSIS — Z87442 Personal history of urinary calculi: Secondary | ICD-10-CM

## 2023-12-24 NOTE — Progress Notes (Signed)
 12/24/2023 9:22 AM   Danny Cook. [birth year] [id number]  CC: Chief Complaint  Patient presents with   Follow-up   HPI: Danny Cook. is a 63 y.o. male with PMH intermittent painless gross hematuria and nephrolithiasis who presents today for follow-up.   Today he reports no flank pain, gross hematuria, or stones passed in the past year.  He has no acute concerns today.  KUB today with no radiopaque urolithiasis.  PMH: Past Medical History:  Diagnosis Date   Anemia    Anxiety    Arthritis    CHF (congestive heart failure) (HCC)    Chronic kidney disease    Renal Insufficiency Syndrome; Glomerulosclerosis 2013   Depressed    Diabetes mellitus without complication (HCC)    type 2, has continuous monitor   GERD (gastroesophageal reflux disease)    Headache    High cholesterol    History of kidney stones    HIV (human immunodeficiency virus infection) (HCC)    Hypertension    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HCC)    Myocardial infarction (HCC)    Paranoid disorder (HCC)    Pneumonia    Schizophrenia, paranoid (HCC)    Sleep apnea    Stroke (HCC)    mini TIA   TIA (transient ischemic attack)     Surgical History: Past Surgical History:  Procedure Laterality Date   CARDIAC CATHETERIZATION     CATARACT EXTRACTION W/PHACO Right 04/25/2021   Procedure: CATARACT EXTRACTION PHACO AND INTRAOCULAR LENS PLACEMENT (IOC) RIGHT DIABETIC;  Surgeon: Jaye Fallow, MD;  Location: [facility] ORS;  Service: Ophthalmology;  Laterality: Right;  3.32 0:23.0   CATARACT EXTRACTION W/PHACO Left 09/19/2021   Procedure: CATARACT EXTRACTION PHACO AND INTRAOCULAR LENS PLACEMENT (IOC) LEFT DIABETIC;  Surgeon: Jaye Fallow, MD;  Location: [facility];  Service: Ophthalmology;  Laterality: Left;  6.73 0:49.1   COLONOSCOPY WITH PROPOFOL  N/A 09/03/2015   Procedure: COLONOSCOPY WITH PROPOFOL ;  Surgeon: Gladis RAYMOND Mariner, MD;  Location: [facility] ENDOSCOPY;  Service: Endoscopy;  Laterality:  N/A;   COLONOSCOPY WITH PROPOFOL  N/A 09/17/2020   Procedure: COLONOSCOPY WITH PROPOFOL ;  Surgeon: Maryruth Ole DASEN, MD;  Location: [facility] ENDOSCOPY;  Service: Endoscopy;  Laterality: N/A;  PREFERS 9AM OR LATER DUE TO TRANSPORTATION   DG TEETH FULL     ESOPHAGOGASTRODUODENOSCOPY (EGD) WITH PROPOFOL  N/A 07/01/2016   Dr. Mariner, [facility]. abnormal esophageal motility, suspicious for presbyesophagus, bile gastritis s/p biopsy, non-bleeding erosive gastropathy s/p biopsy, normal duodenum, path with negative H.pylori and +chronic active gastritis   ESOPHAGOGASTRODUODENOSCOPY (EGD) WITH PROPOFOL  N/A 09/17/2020   Procedure: ESOPHAGOGASTRODUODENOSCOPY (EGD) WITH PROPOFOL ;  Surgeon: Maryruth Ole DASEN, MD;  Location: [facility] ENDOSCOPY;  Service: Endoscopy;  Laterality: N/A;   EXTRACORPOREAL SHOCK WAVE LITHOTRIPSY Right 11/01/2020   Procedure: EXTRACORPOREAL SHOCK WAVE LITHOTRIPSY (ESWL);  Surgeon: Twylla Glendia BROCKS, MD;  Location: [facility];  Service: Urology;  Laterality: Right;   LUMBAR LAMINECTOMY/DECOMPRESSION MICRODISCECTOMY N/A 07/15/2021   Procedure: L3-5 DECOMPRESSION;  Surgeon: Clois Fret, MD;  Location: [facility];  Service: Neurosurgery;  Laterality: N/A;    Home Medications:  Allergies as of 12/24/2023       Reactions   Trazodone  And Nefazodone    Affects other meds that they dont work   Viread [tenofovir Disoproxil]    Damages kidneys   Etodolac Rash        Medication List        Accurate as of December 24, 2023  9:22 AM. If you have any questions, ask your nurse  or doctor.          STOP taking these medications    gemfibrozil  600 MG tablet Commonly known as: LOPID  Stopped by: Lucie Hones   OneTouch Verio test strip Generic drug: glucose blood Stopped by: Lucie Hones       TAKE these medications    albuterol  108 (90 Base) MCG/ACT inhaler Commonly known as: VENTOLIN  HFA Inhale 2 puffs into the lungs every 6 (six) hours as needed for wheezing or  shortness of breath.   aspirin  EC 81 MG tablet Take 81 mg by mouth daily. Swallow whole.   benztropine  0.5 MG tablet Commonly known as: COGENTIN  Take 3 tablets by mouth daily.   brimonidine -timolol  0.2-0.5 % ophthalmic solution Commonly known as: COMBIGAN  Place 1 drop into both eyes every 12 (twelve) hours.   diazepam  2 MG tablet Commonly known as: VALIUM  Take 2 mg by mouth 3 (three) times daily.   dicyclomine  10 MG capsule Commonly known as: BENTYL  Take 10 mg by mouth 4 (four) times daily before meals and nightly   divalproex  500 MG 24 hr tablet Commonly known as: DEPAKOTE  ER Take 500 mg by mouth daily.   docusate sodium  250 MG capsule Commonly known as: COLACE Take 250 mg by mouth 2 (two) times daily.   Fluocinolone Acetonide 0.01 % Oil Place 2-4 drops in ear(s) every other day. At bedtime for dry skin   fluticasone  220 MCG/ACT inhaler Commonly known as: Flovent  HFA Inhale 2 puffs into the lungs 2 (two) times daily. Rinse out mouth afterwards   gabapentin  300 MG capsule Commonly known as: NEURONTIN  Take 2 capsules (600 mg total) by mouth every 8 (eight) hours.   glimepiride  4 MG tablet Commonly known as: AMARYL  Take 4 mg by mouth daily with breakfast.   glycopyrrolate  2 MG tablet Commonly known as: ROBINUL  Take 1 mg by mouth 2 (two) times daily.   iloperidone  4 MG Tabs tablet Commonly known as: FANAPT  Take 1 tablet (4 mg total) by mouth every 12 (twelve) hours.   insulin  lispro 100 UNIT/ML KwikPen Commonly known as: HUMALOG Inject 14 Units into the skin 3 (three) times daily.   Jardiance  25 MG Tabs tablet Generic drug: empagliflozin  Take 25 mg by mouth daily.   meclizine  25 MG tablet Commonly known as: ANTIVERT  Take 1 tablet (25 mg total) by mouth 3 (three) times daily as needed for dizziness.   memantine  10 MG tablet Commonly known as: NAMENDA  Take 1 tablet by mouth 2 (two) times daily.   mirtazapine  30 MG tablet Commonly known as: REMERON  Take  1 tablet (30 mg total) by mouth at bedtime.   mometasone  0.1 % ointment Commonly known as: ELOCON  Apply 1 application  topically See admin instructions. Apply 4 drops to ear canal at bedtime every other night for itching and dry skin.   nitroGLYCERIN  0.4 MG SL tablet Commonly known as: NITROSTAT  Place 1 tablet (0.4 mg total) under the tongue every 5 (five) minutes as needed for chest pain.   omega-3 acid ethyl esters 1 g capsule Commonly known as: LOVAZA  Take 1 capsule by mouth every morning.   omeprazole  40 MG capsule Commonly known as: PRILOSEC Take 40 mg by mouth 2 (two) times daily.   rosuvastatin  40 MG tablet Commonly known as: CRESTOR  Take 40 mg by mouth daily.   sucralfate  1 g tablet Commonly known as: CARAFATE  Take 1 tablet by mouth 4 (four) times daily.   tamsulosin  0.4 MG Caps capsule Commonly known as: FLOMAX  Take 0.4 mg  by mouth daily.   Triumeq  600-50-300 MG tablet Generic drug: abacavir -dolutegravir -lamiVUDine  Take 1 tablet by mouth at bedtime.   UltiCare Mini Pen Needles 31G X 6 MM Misc Generic drug: Insulin  Pen Needle See admin instructions.   venlafaxine  XR 75 MG 24 hr capsule Commonly known as: EFFEXOR -XR Take 1 capsule (75 mg total) by mouth daily with breakfast.   ziprasidone  60 MG capsule Commonly known as: GEODON  Take 60 mg by mouth 2 (two) times daily.        Allergies:  Allergies  Allergen Reactions   Trazodone  And Nefazodone     Affects other meds that they dont work   Viread Secondary school teacher Disoproxil]     Damages kidneys   Etodolac Rash    Family History: Family History  Problem Relation Age of Onset   Heart attack Mother    Diabetes Mellitus II Mother    Mental illness Mother    CAD Mother    Heart attack Father    CAD Father    Hypertension Father     Social History:   reports that he has never smoked. He has never used smokeless tobacco. He reports that he does not drink alcohol and does not use drugs.  Physical  Exam: Ht 5' 7 (1.702 m)   Wt 204 lb 3.2 oz (92.6 kg)   BMI 31.98 kg/m   Constitutional:  Alert and oriented, no acute distress, nontoxic appearing HEENT: [place], AT Cardiovascular: No clubbing, cyanosis, or edema Respiratory: Normal respiratory effort, no increased work of breathing Skin: No rashes, bruises or suspicious lesions Neurologic: Grossly intact, no focal deficits, moving all 4 extremities Psychiatric: Normal mood and affect  Pertinent Imaging: KUB, 12/24/2023: See epic  I personally reviewed the images referenced above and note no radiopaque urolithiasis.  Assessment & Plan:   1. History of nephrolithiasis (Primary) Asymptomatic, no stone seen on KUB this year.  He prefers to keep annual follow-up.  Return in about 1 year (around 12/23/2024) for Annual stone visit with KUB prior.  Lucie Hones, PA-C  [facility] [address] [place], [place] [address] [phone]

## 2024-03-28 ENCOUNTER — Telehealth: Payer: Self-pay | Admitting: Family

## 2024-03-28 NOTE — Telephone Encounter (Signed)
 Called to confirm/remind patient of their appointment at the [facility] on 03/29/24.   Appointment:   [x] Confirmed  [] Left mess   [] No answer/No voice mail  [] VM Full/unable to leave message  [] Phone not in service  Patient reminded to bring all medications and/or complete list.  Confirmed patient has transportation. Gave directions, instructed to utilize valet parking.

## 2024-03-28 NOTE — Progress Notes (Unsigned)
 [facility] Note    ERE:Qpushzmjoi, Alm HERO, MD Primary Cardiologist: Ammon Blunt, MD (last seen 09/25; returns 03/26)  Chief Complaint: fatigue   HPI:  Danny Cook is a 63 y/o male with a history of T2DM, hyperlipidemia, HTN, CKD, HIV (since 1990's), schizophrenia, PSVT, obstructive sleep apnea, anxiety, anemia, depression, kaposi's sarcoma, osteoarthritis and chronic heart failure.   Echo 06/08/18: EF of 55-60%.  Echo 07/25/20: EF of 55-60% along with mild LVH  Admitted 11/03/2020 for 48-hour episode of weakness, status post lithotripsy 2 days prior. ECG revealed premature atrial contractions and intermittent second-degree AV block.   Patient wore a 7-day Holter monitor starting on 01/23/2021, which revealed predominant sinus rhythm with a mean heart rate of 65 bpm. There were occasional premature atrial contractions and premature ventricular contractions present. There were infrequent dropped beats due to Mobitz 2 second-degree AV block. There were occasional episodes of SVT, the longest lasting 6 minutes and 49 seconds at a rate of 166 bpm.   Was in the ED 12/03/2021 for chief complaint of lightheadedness with chest discomfort. ECG revealed sinus bradycardia with first-degree AV block without ischemic ST-T wave changes. Troponin was negative (8, 9). Symptoms were felt to be due to hypoglycemia with capillary blood glucose of 46.   Was in the ED 05/18/22 due to dizziness for 2 days along with possible thrush on his tongue. IVF given. Head CT negative. Had a psych admission in October along with 3 other ED visits in October. Was in the ED 09/25/22 due to dizziness/ fatigue thought to be due to dehydration. Found to be covid +. Was in the ED twice 09/24 but both times left without being seen. Was in the ED 03/23/23 due to weakness after not drinking much water  over prior 2 days. Found to be hyperglycemic, IVF given and improvement of symptoms.   Echo 07/03/22: EF  65-70%  with mild LVH & mild Danny.   Echo 05/20/23: EF >55% with Grade II DD, normal RV, mild AR  He presents today for a HF visit with a chief complaint of moderate fatigue. Occasional dizziness. Sleeping well. Has taken both flu / covid vaccines for this season.   ROS: All systems negative except as listed in HPI, PMH and Problem List.  SH:  Social History   Socioeconomic History   Marital status: Single    Spouse name: Not on file   Number of children: Not on file   Years of education: 11   Highest education level: GED or equivalent  Occupational History   Not on file  Tobacco Use   Smoking status: Never   Smokeless tobacco: Never  Vaping Use   Vaping status: Never Used  Substance and Sexual Activity   Alcohol use: No   Drug use: No    Comment: denies street drugs   Sexual activity: Never  Other Topics Concern   Not on file  Social History Narrative   Not on file   Social Drivers of Health   Financial Resource Strain: Low Risk  (03/01/2024)   Received from [facility]   Overall Financial Resource Strain (CARDIA)    Difficulty of Paying Living Expenses: Not hard at all  Food Insecurity: No Food Insecurity (03/01/2024)   Received from [facility]   Hunger Vital Sign    Within the past 12 months, you worried that your food would run out before you got the money to buy more.: Never true    Within  the past 12 months, the food you bought just didn't last and you didn't have money to get more.: Never true  Transportation Needs: No Transportation Needs (03/01/2024)   Received from [facility] - Transportation    In the past 12 months, has lack of transportation kept you from medical appointments or from getting medications?: No    Lack of Transportation (Non-Medical): No  Physical Activity: Inactive (08/12/2017)   Exercise Vital Sign    Days of Exercise per Week: 0 days    Minutes of Exercise per Session: 0 min  Stress:  Stress Concern Present (08/12/2017)   [employer] of Occupational Health - Occupational Stress Questionnaire    Feeling of Stress : To some extent  Social Connections: Moderately Isolated (08/12/2017)   Social Connection and Isolation Panel    Frequency of Communication with Friends and Family: More than three times a week    Frequency of Social Gatherings with Friends and Family: Three times a week    Attends Religious Services: Never    Active Member of Clubs or Organizations: No    Attends Banker Meetings: Never    Marital Status: Never married  Intimate Partner Violence: Not At Risk (07/13/2022)   Humiliation, Afraid, Rape, and Kick questionnaire    Fear of Current or Ex-Partner: No    Emotionally Abused: No    Physically Abused: No    Sexually Abused: No    FH:  Family History  Problem Relation Age of Onset   Heart attack Mother    Diabetes Mellitus II Mother    Mental illness Mother    CAD Mother    Heart attack Father    CAD Father    Hypertension Father     Past Medical History:  Diagnosis Date   Anemia    Anxiety    Arthritis    CHF (congestive heart failure) (HCC)    Chronic kidney disease    Renal Insufficiency Syndrome; Glomerulosclerosis 2013   Depressed    Diabetes mellitus without complication (HCC)    type 2, has continuous monitor   GERD (gastroesophageal reflux disease)    Headache    High cholesterol    History of kidney stones    HIV (human immunodeficiency virus infection) (HCC)    Hypertension    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HCC)    Myocardial infarction (HCC)    Paranoid disorder (HCC)    Pneumonia    Schizophrenia, paranoid (HCC)    Sleep apnea    Stroke (HCC)    mini TIA   TIA (transient ischemic attack)     Current Outpatient Medications  Medication Sig Dispense Refill   abacavir -dolutegravir -lamiVUDine  (TRIUMEQ ) 600-50-300 MG tablet Take 1 tablet by mouth at bedtime. 30 tablet 6   albuterol  (PROVENTIL  HFA;VENTOLIN  HFA) 108  (90 Base) MCG/ACT inhaler Inhale 2 puffs into the lungs every 6 (six) hours as needed for wheezing or shortness of breath. 1 Inhaler 2   aspirin  EC 81 MG tablet Take 81 mg by mouth daily. Swallow whole.     benztropine  (COGENTIN ) 0.5 MG tablet Take 3 tablets by mouth daily.     brimonidine -timolol  (COMBIGAN ) 0.2-0.5 % ophthalmic solution Place 1 drop into both eyes every 12 (twelve) hours.     diazepam  (VALIUM ) 2 MG tablet Take 2 mg by mouth 3 (three) times daily.     dicyclomine  (BENTYL ) 10 MG capsule Take 10 mg by mouth 4 (four) times daily before meals and nightly  divalproex  (DEPAKOTE  ER) 500 MG 24 hr tablet Take 500 mg by mouth daily.     docusate sodium  (COLACE) 250 MG capsule Take 250 mg by mouth 2 (two) times daily.     Fluocinolone Acetonide 0.01 % OIL Place 2-4 drops in ear(s) every other day. At bedtime for dry skin     fluticasone  (FLOVENT  HFA) 220 MCG/ACT inhaler Inhale 2 puffs into the lungs 2 (two) times daily. Rinse out mouth afterwards 1 Inhaler 12   gabapentin  (NEURONTIN ) 300 MG capsule Take 2 capsules (600 mg total) by mouth every 8 (eight) hours. 180 capsule 0   glimepiride  (AMARYL ) 4 MG tablet Take 4 mg by mouth daily with breakfast.     glycopyrrolate  (ROBINUL ) 2 MG tablet Take 1 mg by mouth 2 (two) times daily.     iloperidone  (FANAPT ) 4 MG TABS tablet Take 1 tablet (4 mg total) by mouth every 12 (twelve) hours. 60 tablet 0   insulin  lispro (HUMALOG) 100 UNIT/ML KwikPen Inject 14 Units into the skin 3 (three) times daily.     JARDIANCE  25 MG TABS tablet Take 25 mg by mouth daily.     meclizine  (ANTIVERT ) 25 MG tablet Take 1 tablet (25 mg total) by mouth 3 (three) times daily as needed for dizziness. 30 tablet 0   memantine  (NAMENDA ) 10 MG tablet Take 1 tablet by mouth 2 (two) times daily.     mirtazapine  (REMERON ) 30 MG tablet Take 1 tablet (30 mg total) by mouth at bedtime. 30 tablet 0   mometasone  (ELOCON ) 0.1 % ointment Apply 1 application  topically See admin  instructions. Apply 4 drops to ear canal at bedtime every other night for itching and dry skin.     nitroGLYCERIN  (NITROSTAT ) 0.4 MG SL tablet Place 1 tablet (0.4 mg total) under the tongue every 5 (five) minutes as needed for chest pain. 20 tablet 5   omega-3 acid ethyl esters (LOVAZA ) 1 g capsule Take 1 capsule by mouth every morning.     omeprazole  (PRILOSEC) 40 MG capsule Take 40 mg by mouth 2 (two) times daily.     rosuvastatin  (CRESTOR ) 40 MG tablet Take 40 mg by mouth daily.      sucralfate  (CARAFATE ) 1 g tablet Take 1 tablet by mouth 4 (four) times daily.     tamsulosin  (FLOMAX ) 0.4 MG CAPS capsule Take 0.4 mg by mouth daily.     ULTICARE MINI PEN NEEDLES 31G X 6 MM MISC See admin instructions.     venlafaxine  XR (EFFEXOR -XR) 75 MG 24 hr capsule Take 1 capsule (75 mg total) by mouth daily with breakfast. 30 capsule 0   ziprasidone  (GEODON ) 60 MG capsule Take 60 mg by mouth 2 (two) times daily.     No current facility-administered medications for this visit.   Vitals:   03/29/24 0843  BP: 138/69  Pulse: 66  SpO2: 97%  Weight: 207 lb 12.8 oz (94.3 kg)   Wt Readings from Last 3 Encounters:  03/29/24 207 lb 12.8 oz (94.3 kg)  12/24/23 204 lb 3.2 oz (92.6 kg)  11/03/23 204 lb 12.9 oz (92.9 kg)   Lab Results  Component Value Date   CREATININE 1.15 11/03/2023   CREATININE 1.28 (H) 08/24/2023   CREATININE 1.41 (H) 03/23/2023     PHYSICAL EXAM:  General: Well appearing.  Cor: No JVD. Regular rhythm, rate.  Lungs: clear Abdomen: soft, nontender, nondistended. Extremities: no edema Neuro:. Affect pleasant    ECG: not done   ASSESSMENT & PLAN:  1: NICM with preserved ejection fraction- - suspect due to HTN/ sleep apnea - NYHA class III - euvolemic today - weight up 3 pounds from last visit here 6 months ago - Echo 07/25/20: EF of 55-60% along with mild LVH - Echo 07/03/22: EF  65-70% with mild LVH & mild Danny.   - Echo 05/20/23: EF >55% with Grade II DD, normal RV, mild  AR - not adding salt to his food and trying to watch the sodium content of his food - saw cardiology (Paraschos) 09/25 - continue jardiance  25mg  daily (DM dose) - since BP has stabilized, will begin Kerendia 10mg  daily as EF >40% to help reduce risk of CV death, HF hospitalization. If insurance doesn't cover it, will use spironolactone - BMET today, 1 week after begin on medication and at next OV  - BNP 07/10/22 was 20.9  2: HTN- - BP 138/69 - saw PCP Dois) 09/25 - BMP 03/01/24 reviewed: sodium 141, potassium 4.3, creatinine 1.5 and GFR 51 - BMET today  3: Diabetes- - A1c 03/01/24 reviewed and was 7.1% - saw endocrinologist GERALDTHEORA Laundry) 07/25 - saw nephrologist Murry) 09/25  4: OSA- - home sleep study was normal; no need to resume CPAP   Return in 1 month, sooner if needed.   I spent 30 minutes reviewing records, interviewing/ examing patient and managing plan/ orders.    Ellouise DELENA Class, FNP 03/28/24

## 2024-03-29 ENCOUNTER — Ambulatory Visit: Admitting: Family

## 2024-03-29 ENCOUNTER — Ambulatory Visit: Payer: Self-pay | Admitting: Family

## 2024-03-29 ENCOUNTER — Encounter: Payer: Self-pay | Admitting: Family

## 2024-03-29 ENCOUNTER — Other Ambulatory Visit
Admission: RE | Admit: 2024-03-29 | Discharge: 2024-03-29 | Disposition: A | Discharge status: Home / Self Care | Source: Ambulatory Visit | Attending: Family | Admitting: Family

## 2024-03-29 ENCOUNTER — Other Ambulatory Visit: Payer: Self-pay

## 2024-03-29 VITALS — BP 138/69 | HR 66 | Wt 207.8 lb

## 2024-03-29 DIAGNOSIS — Z794 Long term (current) use of insulin: Secondary | ICD-10-CM | POA: Diagnosis not present

## 2024-03-29 DIAGNOSIS — I428 Other cardiomyopathies: Secondary | ICD-10-CM | POA: Insufficient documentation

## 2024-03-29 DIAGNOSIS — B20 Human immunodeficiency virus [HIV] disease: Secondary | ICD-10-CM | POA: Diagnosis not present

## 2024-03-29 DIAGNOSIS — G4733 Obstructive sleep apnea (adult) (pediatric): Secondary | ICD-10-CM | POA: Diagnosis not present

## 2024-03-29 DIAGNOSIS — I5032 Chronic diastolic (congestive) heart failure: Secondary | ICD-10-CM

## 2024-03-29 DIAGNOSIS — I10 Essential (primary) hypertension: Secondary | ICD-10-CM | POA: Diagnosis not present

## 2024-03-29 DIAGNOSIS — N189 Chronic kidney disease, unspecified: Secondary | ICD-10-CM | POA: Diagnosis not present

## 2024-03-29 DIAGNOSIS — Z79624 Long term (current) use of inhibitors of nucleotide synthesis: Secondary | ICD-10-CM | POA: Insufficient documentation

## 2024-03-29 DIAGNOSIS — C469 Kaposi's sarcoma, unspecified: Secondary | ICD-10-CM | POA: Diagnosis not present

## 2024-03-29 DIAGNOSIS — E785 Hyperlipidemia, unspecified: Secondary | ICD-10-CM | POA: Diagnosis not present

## 2024-03-29 DIAGNOSIS — Z79899 Other long term (current) drug therapy: Secondary | ICD-10-CM | POA: Insufficient documentation

## 2024-03-29 DIAGNOSIS — I130 Hypertensive heart and chronic kidney disease with heart failure and stage 1 through stage 4 chronic kidney disease, or unspecified chronic kidney disease: Secondary | ICD-10-CM | POA: Diagnosis not present

## 2024-03-29 DIAGNOSIS — Z7984 Long term (current) use of oral hypoglycemic drugs: Secondary | ICD-10-CM | POA: Insufficient documentation

## 2024-03-29 DIAGNOSIS — N182 Chronic kidney disease, stage 2 (mild): Secondary | ICD-10-CM

## 2024-03-29 DIAGNOSIS — E1122 Type 2 diabetes mellitus with diabetic chronic kidney disease: Secondary | ICD-10-CM | POA: Diagnosis not present

## 2024-03-29 LAB — BASIC METABOLIC PANEL WITH GFR
Anion gap: 12 (ref 5–15)
BUN: 19 mg/dL (ref 8–23)
CO2: 26 mmol/L (ref 22–32)
Calcium: 9.2 mg/dL (ref 8.9–10.3)
Chloride: 101 mmol/L (ref 98–111)
Creatinine, Ser: 1.65 mg/dL — ABNORMAL HIGH (ref 0.61–1.24)
GFR, Estimated: 46 mL/min — ABNORMAL LOW (ref >60)
Glucose, Bld: 130 mg/dL — ABNORMAL HIGH (ref 70–99)
Potassium: 4 mmol/L (ref 3.5–5.1)
Sodium: 139 mmol/L (ref 135–145)

## 2024-03-29 MED ORDER — KERENDIA 10 MG PO TABS: 10.0000 mg | ORAL_TABLET | Freq: Every day | ORAL | 5 refills | Status: AC

## 2024-03-29 NOTE — Patient Instructions (Signed)
 Medication Changes:  START Kerendia 10mg  (1 tab) daily  Lab Work:  Go downstairs to [place] on LOWER LEVEL to have your blood work completed TODAY AND IN 1 WEEK.  We will only call you if the results are abnormal or if the provider would like to make medication changes.  No news is good news.   Follow-Up in: Please follow up with the Advanced [facility] Failure Clinic in 1 month with Ellouise Class, FNP.   Thank you for choosing [facility].    At the Advanced Heart Failure Clinic, you and your health needs are our priority. We have a designated team specialized in the treatment of Heart Failure. This Care Team includes your primary Heart Failure Specialized Cardiologist (physician), Advanced Practice Providers (APPs- Physician Assistants and Nurse Practitioners), and Pharmacist who all work together to provide you with the care you need, when you need it.   You may see any of the following providers on your designated Care Team at your next follow up:  Dr. Toribio Fuel Dr. Ezra Shuck Dr. Ria Commander Dr. Morene Brownie Ellouise Class, FNP Jaun Bash, RPH-CPP  Please be sure to bring in all your medications bottles to every appointment.   Need to Contact Us :  If you have any questions or concerns before your next appointment please send us  a message through [place] or call our office at [phone].    TO LEAVE A MESSAGE FOR THE NURSE SELECT OPTION 2, PLEASE LEAVE A MESSAGE INCLUDING: YOUR NAME DATE OF BIRTH CALL BACK NUMBER REASON FOR CALL**this is important as we prioritize the call backs  YOU WILL RECEIVE A CALL BACK THE SAME DAY AS LONG AS YOU CALL BEFORE 4:00 PM

## 2024-04-03 ENCOUNTER — Other Ambulatory Visit: Payer: Self-pay

## 2024-04-03 ENCOUNTER — Emergency Department
Admission: EM | Admit: 2024-04-03 | Discharge: 2024-04-03 | Disposition: A | Discharge status: Home / Self Care | Attending: Emergency Medicine | Admitting: Emergency Medicine

## 2024-04-03 ENCOUNTER — Emergency Department

## 2024-04-03 DIAGNOSIS — I130 Hypertensive heart and chronic kidney disease with heart failure and stage 1 through stage 4 chronic kidney disease, or unspecified chronic kidney disease: Secondary | ICD-10-CM | POA: Insufficient documentation

## 2024-04-03 DIAGNOSIS — R059 Cough, unspecified: Secondary | ICD-10-CM | POA: Diagnosis present

## 2024-04-03 DIAGNOSIS — Z794 Long term (current) use of insulin: Secondary | ICD-10-CM | POA: Insufficient documentation

## 2024-04-03 DIAGNOSIS — E1122 Type 2 diabetes mellitus with diabetic chronic kidney disease: Secondary | ICD-10-CM | POA: Diagnosis not present

## 2024-04-03 DIAGNOSIS — J441 Chronic obstructive pulmonary disease with (acute) exacerbation: Secondary | ICD-10-CM | POA: Diagnosis not present

## 2024-04-03 DIAGNOSIS — N189 Chronic kidney disease, unspecified: Secondary | ICD-10-CM | POA: Insufficient documentation

## 2024-04-03 DIAGNOSIS — Z21 Asymptomatic human immunodeficiency virus [HIV] infection status: Secondary | ICD-10-CM | POA: Insufficient documentation

## 2024-04-03 DIAGNOSIS — I509 Heart failure, unspecified: Secondary | ICD-10-CM | POA: Insufficient documentation

## 2024-04-03 DIAGNOSIS — E119 Type 2 diabetes mellitus without complications: Secondary | ICD-10-CM

## 2024-04-03 LAB — CBC WITH DIFFERENTIAL/PLATELET
Abs Immature Granulocytes: 0.02 K/uL (ref 0.00–0.07)
Basophils Absolute: 0.1 K/uL (ref 0.0–0.1)
Basophils Relative: 1 %
Eosinophils Absolute: 0.1 K/uL (ref 0.0–0.5)
Eosinophils Relative: 2 %
HCT: 41.7 % (ref 39.0–52.0)
Hemoglobin: 14.4 g/dL (ref 13.0–17.0)
Immature Granulocytes: 0 %
Lymphocytes Relative: 42 %
Lymphs Abs: 2.6 K/uL (ref 0.7–4.0)
MCH: 31.5 pg (ref 26.0–34.0)
MCHC: 34.5 g/dL (ref 30.0–36.0)
MCV: 91.2 fL (ref 80.0–100.0)
Monocytes Absolute: 0.6 K/uL (ref 0.1–1.0)
Monocytes Relative: 11 %
Neutro Abs: 2.7 K/uL (ref 1.7–7.7)
Neutrophils Relative %: 44 %
Platelets: 190 K/uL (ref 150–400)
RBC: 4.57 MIL/uL (ref 4.22–5.81)
RDW: 12.8 % (ref 11.5–15.5)
WBC: 6.1 K/uL (ref 4.0–10.5)
nRBC: 0 % (ref 0.0–0.2)

## 2024-04-03 LAB — RESP PANEL BY RT-PCR (RSV, FLU A&B, COVID)  RVPGX2
Influenza A by PCR: NEGATIVE
Influenza B by PCR: NEGATIVE
Resp Syncytial Virus by PCR: NEGATIVE
SARS Coronavirus 2 by RT PCR: NEGATIVE

## 2024-04-03 LAB — COMPREHENSIVE METABOLIC PANEL WITH GFR
ALT: 22 U/L (ref 0–44)
AST: 25 U/L (ref 15–41)
Albumin: 3.7 g/dL (ref 3.5–5.0)
Alkaline Phosphatase: 50 U/L (ref 38–126)
Anion gap: 12 (ref 5–15)
BUN: 25 mg/dL — ABNORMAL HIGH (ref 8–23)
CO2: 23 mmol/L (ref 22–32)
Calcium: 9.1 mg/dL (ref 8.9–10.3)
Chloride: 104 mmol/L (ref 98–111)
Creatinine, Ser: 1.61 mg/dL — ABNORMAL HIGH (ref 0.61–1.24)
GFR, Estimated: 47 mL/min — ABNORMAL LOW (ref >60)
Glucose, Bld: 87 mg/dL (ref 70–99)
Potassium: 4 mmol/L (ref 3.5–5.1)
Sodium: 139 mmol/L (ref 135–145)
Total Bilirubin: 0.4 mg/dL (ref 0.0–1.2)
Total Protein: 7.3 g/dL (ref 6.5–8.1)

## 2024-04-03 LAB — URINALYSIS, W/ REFLEX TO CULTURE (INFECTION SUSPECTED)
Bacteria, UA: NONE SEEN
Bilirubin Urine: NEGATIVE
Glucose, UA: >=500 mg/dL — AB
Hgb urine dipstick: NEGATIVE
Ketones, ur: NEGATIVE mg/dL
Leukocytes,Ua: NEGATIVE
Nitrite: NEGATIVE
Protein, ur: 100 mg/dL — AB
RBC / HPF: 0 RBC/hpf (ref 0–5)
Specific Gravity, Urine: 1.016 (ref 1.005–1.030)
Squamous Epithelial / HPF: 0 /HPF (ref 0–5)
WBC, UA: 0 WBC/hpf (ref 0–5)
pH: 7 (ref 5.0–8.0)

## 2024-04-03 LAB — LACTIC ACID, PLASMA: Lactic Acid, Venous: 1.3 mmol/L (ref 0.5–1.9)

## 2024-04-03 MED ORDER — PREDNISONE 20 MG PO TABS: 40.0000 mg | ORAL_TABLET | Freq: Every day | ORAL | 0 refills | Status: AC

## 2024-04-03 MED ORDER — PREDNISONE 20 MG PO TABS
60.0000 mg | ORAL_TABLET | Freq: Once | ORAL | Status: AC
Start: 2024-04-03 — End: 2024-04-03
  Administered 2024-04-03: 60 mg via ORAL
  Filled 2024-04-03: qty 3

## 2024-04-03 MED ORDER — IPRATROPIUM-ALBUTEROL 0.5-2.5 (3) MG/3ML IN SOLN
9.0000 mL | Freq: Once | RESPIRATORY_TRACT | Status: AC
  Administered 2024-04-03: 9 mL via RESPIRATORY_TRACT
  Filled 2024-04-03: qty 3

## 2024-04-03 NOTE — ED Triage Notes (Addendum)
 Pt BIB [facility] from home. Pt with a Hx of HIV, CHF, DM2, and HTN presents with productive cough, ShOB, wheezing that started on 9/29. He was not evaluated but had his PCP send in Rx for doxycycline  100 mg BID for 7 days, and a cough syrup. Pt is having progressive worsening of symptoms. He does have some associated chills. Denies N/V/D, orthopnea.    EMS Vitals  HR 60 130/87 SpO2 98% on RA CBG 81

## 2024-04-03 NOTE — ED Provider Notes (Signed)
 [facility] Provider Note    Event Date/Time   First MD Initiated Contact with Patient 04/03/24 1508     (approximate)   History   Chief Complaint: Shortness of breath  HPI  Danny Cook. is a 63 y.o. male with a history of CHF CKD diabetes GERD HIV who comes ED complaining of shortness of breath and cough for the last 7 days.  His doctor prescribed him doxycycline  4 days ago which she has been taking without relief of his symptoms.  Also has a Qvar inhaler which has not helped.  Denies chest pain or pleuritic discomfort.  Has chills but no fever.  No exertional symptoms.  No lower extremity pain or swelling        Past Medical History:  Diagnosis Date   Anemia    Anxiety    Arthritis    CHF (congestive heart failure) (HCC)    Chronic kidney disease    Renal Insufficiency Syndrome; Glomerulosclerosis 2013   Depressed    Diabetes mellitus without complication (HCC)    type 2, has continuous monitor   GERD (gastroesophageal reflux disease)    Headache    High cholesterol    History of kidney stones    HIV (human immunodeficiency virus infection) (HCC)    Hypertension    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HCC)    Myocardial infarction (HCC)    Paranoid disorder (HCC)    Pneumonia    Schizophrenia, paranoid (HCC)    Sleep apnea    Stroke [facility])    mini TIA   TIA (transient ischemic attack)     Current Outpatient Rx   Order #: [id number] Class: Normal   Order #: [id number] Class: Normal   Order #: [id number] Class: Normal   Order #: [id number] Class: Historical Med   Order #: [id number] Class: Historical Med   Order #: [id number] Class: Historical Med   Order #: [id number] Class: Historical Med   Order #: [id number] Class: Historical Med   Order #: [id number] Class: Historical Med   Order #: [id number] Class: Historical Med   Order #: [id number] Class: Normal   Order #: [id number] Class: Historical Med   Order #: [id number] Class: Normal   Order #: [id number] Class:  Normal   Order #: [id number] Class: Historical Med   Order #: [id number] Class: Historical Med   Order #: [id number] Class: Normal   Order #: [id number] Class: Historical Med   Order #: [id number] Class: Historical Med   Order #: [id number] Class: Normal   Order #: [id number] Class: Historical Med   Order #: [id number] Class: Normal   Order #: [id number] Class: Historical Med   Order #: [id number] Class: Normal   Order #: [id number] Class: Historical Med   Order #: [id number] Class: Historical Med   Order #: [id number] Class: Historical Med   Order #: [id number] Class: Historical Med   Order #: [id number] Class: Historical Med   Order #: [id number] Class: Historical Med   Order #: [id number] Class: Normal   Order #: [id number] Class: Historical Med    Past Surgical History:  Procedure Laterality Date   CARDIAC CATHETERIZATION     CATARACT EXTRACTION W/PHACO Right 04/25/2021   Procedure: CATARACT EXTRACTION PHACO AND INTRAOCULAR LENS PLACEMENT (IOC) RIGHT DIABETIC;  Surgeon: Jaye Fallow, MD;  Location: [facility] ORS;  Service: Ophthalmology;  Laterality: Right;  3.32 0:23.0   CATARACT EXTRACTION W/PHACO Left 09/19/2021   Procedure: CATARACT EXTRACTION PHACO AND INTRAOCULAR LENS PLACEMENT (IOC) LEFT DIABETIC;  Surgeon: Jaye Fallow, MD;  Location: [facility] ORS;  Service: Ophthalmology;  Laterality: Left;  6.73 0:49.1   COLONOSCOPY WITH PROPOFOL  N/A 09/03/2015   Procedure: COLONOSCOPY WITH  PROPOFOL ;  Surgeon: Gladis RAYMOND Mariner, MD;  Location: [facility] ENDOSCOPY;  Service: Endoscopy;  Laterality: N/A;   COLONOSCOPY WITH PROPOFOL  N/A 09/17/2020   Procedure: COLONOSCOPY WITH PROPOFOL ;  Surgeon: Maryruth Ole DASEN, MD;  Location: [facility] ENDOSCOPY;  Service: Endoscopy;  Laterality: N/A;  PREFERS 9AM OR LATER DUE TO TRANSPORTATION   DG TEETH FULL     ESOPHAGOGASTRODUODENOSCOPY (EGD) WITH PROPOFOL  N/A 07/01/2016   Dr. Mariner, [facility]. abnormal esophageal motility, suspicious for presbyesophagus, bile gastritis s/p biopsy,  non-bleeding erosive gastropathy s/p biopsy, normal duodenum, path with negative H.pylori and +chronic active gastritis   ESOPHAGOGASTRODUODENOSCOPY (EGD) WITH PROPOFOL  N/A 09/17/2020   Procedure: ESOPHAGOGASTRODUODENOSCOPY (EGD) WITH PROPOFOL ;  Surgeon: Maryruth Ole DASEN, MD;  Location: [facility] ENDOSCOPY;  Service: Endoscopy;  Laterality: N/A;   EXTRACORPOREAL SHOCK WAVE LITHOTRIPSY Right 11/01/2020   Procedure: EXTRACORPOREAL SHOCK WAVE LITHOTRIPSY (ESWL);  Surgeon: Twylla Glendia BROCKS, MD;  Location: [facility] ORS;  Service: Urology;  Laterality: Right;   LUMBAR LAMINECTOMY/DECOMPRESSION MICRODISCECTOMY N/A 07/15/2021   Procedure: L3-5 DECOMPRESSION;  Surgeon: Clois Fret, MD;  Location: [facility] ORS;  Service: Neurosurgery;  Laterality: N/A;    Physical Exam   Triage Vital Signs: ED Triage Vitals  Encounter Vitals Group     BP 04/03/24 1401 133/67     Girls Systolic BP Percentile --      Girls Diastolic BP Percentile --      Boys Systolic BP Percentile --      Boys Diastolic BP Percentile --      Pulse Rate 04/03/24 1401 61     Resp 04/03/24 1401 20     Temp 04/03/24 1401 98.5 F (36.9 C)     Temp Source 04/03/24 1401 Oral     SpO2 04/03/24 1401 95 %     Weight 04/03/24 1357 203 lb (92.1 kg)     Height 04/03/24 1357 5' 7 (1.702 m)     Head Circumference --      Peak Flow --      Pain Score 04/03/24 1357 0     Pain Loc --      Pain Education --      Exclude from Growth Chart --     Most recent vital signs: Vitals:   04/03/24 1630 04/03/24 1700  BP: 123/72 126/70  Pulse: (!) 59 (!) 57  Resp:    Temp:    SpO2: 96% 96%    General: Awake, no distress.  CV:  Good peripheral perfusion.  Regular rate rhythm Resp:  Normal effort.  Coarse inspiratory breath sounds, diffuse expiratory wheezing with prolonged expiratory phase, accentuated by FEV1 maneuver. Abd:  No distention.  Soft nontender Other:  No lower extremity edema, no calf tenderness.  Symmetric calf circumference   ED  Results / Procedures / Treatments   Labs (all labs ordered are listed, but only abnormal results are displayed) Labs Reviewed  COMPREHENSIVE METABOLIC PANEL WITH GFR - Abnormal; Notable for the following components:      Result Value   BUN 25 (*)    Creatinine, Ser 1.61 (*)    GFR, Estimated 47 (*)    All other components within normal limits  URINALYSIS, W/ REFLEX TO CULTURE (INFECTION SUSPECTED) - Abnormal; Notable for the following components:   Color, Urine YELLOW (*)    APPearance CLEAR (*)    Glucose, UA >=500 (*)    Protein, ur 100 (*)    All other components within normal limits  RESP PANEL BY RT-PCR (RSV, FLU A&B, COVID)  RVPGX2  LACTIC ACID, PLASMA  CBC WITH DIFFERENTIAL/PLATELET     EKG    RADIOLOGY Chest x-ray interpreted by me, unremarkable.  Radiology report reviewed   PROCEDURES:  Procedures   MEDICATIONS ORDERED IN ED: Medications  predniSONE  (DELTASONE ) tablet 60 mg (60 mg Oral Given 04/03/24 1526)  ipratropium-albuterol  (DUONEB) 0.5-2.5 (3) MG/3ML nebulizer solution 9 mL (9 mLs Nebulization Given 04/03/24 1527)     IMPRESSION / MDM / ASSESSMENT AND PLAN / ED COURSE  I reviewed the triage vital signs and the nursing notes.  DDx: COPD exacerbation, pleural effusion, lobar pneumonia, electrolyte derangement, anemia, COVID, influenza  Patient's presentation is most consistent with acute presentation with potential threat to life or bodily function.  Patient presents with persistent shortness of breath and nonproductive cough for the past week, despite being on doxycycline  for the last 4 days.  Clinically has COPD exacerbation.  Doubt ACS PE dissection pericardial effusion.  Labs are normal, vital signs are normal, otherwise nontoxic.  Chest x-ray without signs of focal pneumonia.  Will start on prednisone , DuoNebs.   ----------------------------------------- 5:37 PM on 04/03/2024 ----------------------------------------- Feeling better, wheezing  improved.  Oxygenation remains normal.  Stable for discharge, continue prednisone  course at home.      FINAL CLINICAL IMPRESSION(S) / ED DIAGNOSES   Final diagnoses:  COPD exacerbation (HCC)  Type 2 diabetes mellitus without complication, with long-term current use of insulin  (HCC)     Rx / DC Orders   ED Discharge Orders          Ordered    predniSONE  (DELTASONE ) 20 MG tablet  Daily with breakfast        04/03/24 1737             Note:  This document was prepared using Dragon voice recognition software and may include unintentional dictation errors.   Viviann Pastor, MD 04/03/24 [phone]

## 2024-04-04 ENCOUNTER — Telehealth: Payer: Self-pay | Admitting: Family

## 2024-04-04 NOTE — Telephone Encounter (Signed)
 PA for [place] approved. (Key: [id number])

## 2024-04-19 ENCOUNTER — Other Ambulatory Visit
Admission: RE | Admit: 2024-04-19 | Discharge: 2024-04-19 | Disposition: A | Discharge status: Home / Self Care | Attending: Family | Admitting: Family

## 2024-04-19 ENCOUNTER — Ambulatory Visit: Payer: Self-pay | Admitting: Family

## 2024-04-19 DIAGNOSIS — I5032 Chronic diastolic (congestive) heart failure: Secondary | ICD-10-CM | POA: Insufficient documentation

## 2024-04-19 LAB — BASIC METABOLIC PANEL WITH GFR
Anion gap: 9 (ref 5–15)
BUN: 27 mg/dL — ABNORMAL HIGH (ref 8–23)
CO2: 28 mmol/L (ref 22–32)
Calcium: 8.8 mg/dL — ABNORMAL LOW (ref 8.9–10.3)
Chloride: 102 mmol/L (ref 98–111)
Creatinine, Ser: 1.68 mg/dL — ABNORMAL HIGH (ref 0.61–1.24)
GFR, Estimated: 45 mL/min — ABNORMAL LOW (ref >60)
Glucose, Bld: 132 mg/dL — ABNORMAL HIGH (ref 70–99)
Potassium: 4.6 mmol/L (ref 3.5–5.1)
Sodium: 139 mmol/L (ref 135–145)

## 2024-04-25 ENCOUNTER — Telehealth: Payer: Self-pay | Admitting: Family

## 2024-04-25 NOTE — Progress Notes (Unsigned)
 [facility] Note    ERE:Qpushzmjoi, Alm HERO, MD Primary Cardiologist: Ammon Blunt, MD (last seen 09/25; returns 03/26)  Chief Complaint: fatigue   HPI:  [name] is a 63 y/o male with a history of stroke,T2DM, hyperlipidemia, HTN, CKD, HIV (since 1990's), schizophrenia, PSVT, obstructive sleep apnea, anxiety, anemia, depression, kaposi's sarcoma, osteoarthritis and chronic heart failure.   Echo 06/08/18: EF of 55-60%.  Echo 07/25/20: EF of 55-60% along with mild LVH  Admitted 11/03/2020 for 48-hour episode of weakness, status post lithotripsy 2 days prior. ECG revealed premature atrial contractions and intermittent second-degree AV block.   Patient wore a 7-day Holter monitor starting on 01/23/2021, which revealed predominant sinus rhythm with a mean heart rate of 65 bpm. There were occasional premature atrial contractions and premature ventricular contractions present. There were infrequent dropped beats due to Mobitz 2 second-degree AV block. There were occasional episodes of SVT, the longest lasting 6 minutes and 49 seconds at a rate of 166 bpm.   Was in the ED 12/03/2021 for chief complaint of lightheadedness with chest discomfort. ECG revealed sinus bradycardia with first-degree AV block without ischemic ST-T wave changes. Troponin was negative (8, 9). Symptoms were felt to be due to hypoglycemia with capillary blood glucose of 46.   Was in the ED 05/18/22 due to dizziness for 2 days along with possible thrush on his tongue. IVF given. Head CT negative. Had a psych admission in October along with 3 other ED visits in October. Was in the ED 09/25/22 due to dizziness/ fatigue thought to be due to dehydration. Found to be covid +. Was in the ED twice 09/24 but both times left without being seen. Was in the ED 03/23/23 due to weakness after not drinking much water  over prior 2 days. Found to be hyperglycemic, IVF given and improvement of symptoms.   Echo 07/03/22: EF   65-70% with mild LVH & mild MR.   Echo 05/20/23: EF >55% with Grade II DD, normal RV, mild AR  ED visit 04/07/24 for progressive shortness of breath and cough. Had taken doxycycline  for 4 days prior with no symptoms improvement. CXR showed bronchial thickening. Diagnosied with COPD exaberation. Discharged home with prednisone  and inhaler.   He presents today for a HF visit with a chief complaint of occasional fatigue. Denies shortness of breath, chest pain, palpitations, dizziness, edema, abdominal distention, and sleep difficulty. Has been stable since discharge with no worsening or new symptoms.   Has started kerendia with no associated complications. Continues to take jardiance . Has started checking his BP daily per PCP. Weighing occasionally. Minimal salt use with meals and primarily consumes water  and diet sodas. Denies alcohol, smoking, or drug use.   ROS: All systems negative except as listed in HPI, PMH and Problem List.  SH:  Social History   Socioeconomic History   Marital status: Single    Spouse name: Not on file   Number of children: Not on file   Years of education: 11   Highest education level: GED or equivalent  Occupational History   Not on file  Tobacco Use   Smoking status: Never   Smokeless tobacco: Never  Vaping Use   Vaping status: Never Used  Substance and Sexual Activity   Alcohol use: No   Drug use: No    Comment: denies street drugs   Sexual activity: Never  Other Topics Concern   Not on file  Social History Narrative   Not on file   Social  Drivers of Corporate Investment Banker Strain: Low Risk  (03/01/2024)   Received from [facility]   Overall Financial Resource Strain (CARDIA)    Difficulty of Paying Living Expenses: Not hard at all  Food Insecurity: No Food Insecurity (03/01/2024)   Received from [facility]   Hunger Vital Sign    Within the past 12 months, you worried that your food would run out before you  got the money to buy more.: Never true    Within the past 12 months, the food you bought just didn't last and you didn't have money to get more.: Never true  Transportation Needs: No Transportation Needs (03/01/2024)   Received from [facility] - Transportation    In the past 12 months, has lack of transportation kept you from medical appointments or from getting medications?: No    Lack of Transportation (Non-Medical): No  Physical Activity: Inactive (08/12/2017)   Exercise Vital Sign    Days of Exercise per Week: 0 days    Minutes of Exercise per Session: 0 min  Stress: Stress Concern Present (08/12/2017)   [employer] of Occupational Health - Occupational Stress Questionnaire    Feeling of Stress : To some extent  Social Connections: Moderately Isolated (08/12/2017)   Social Connection and Isolation Panel    Frequency of Communication with Friends and Family: More than three times a week    Frequency of Social Gatherings with Friends and Family: Three times a week    Attends Religious Services: Never    Active Member of Clubs or Organizations: No    Attends Banker Meetings: Never    Marital Status: Never married  Intimate Partner Violence: Not At Risk (07/13/2022)   Humiliation, Afraid, Rape, and Kick questionnaire    Fear of Current or Ex-Partner: No    Emotionally Abused: No    Physically Abused: No    Sexually Abused: No    FH:  Family History  Problem Relation Age of Onset   Heart attack Mother    Diabetes Mellitus II Mother    Mental illness Mother    CAD Mother    Heart attack Father    CAD Father    Hypertension Father     Past Medical History:  Diagnosis Date   Anemia    Anxiety    Arthritis    CHF (congestive heart failure) (HCC)    Chronic kidney disease    Renal Insufficiency Syndrome; Glomerulosclerosis 2013   Depressed    Diabetes mellitus without complication (HCC)    type 2, has continuous monitor   GERD  (gastroesophageal reflux disease)    Headache    High cholesterol    History of kidney stones    HIV (human immunodeficiency virus infection) (HCC)    Hypertension    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HCC)    Myocardial infarction (HCC)    Paranoid disorder (HCC)    Pneumonia    Schizophrenia, paranoid (HCC)    Sleep apnea    Stroke (HCC)    mini TIA   TIA (transient ischemic attack)     Current Outpatient Medications  Medication Sig Dispense Refill   abacavir -dolutegravir -lamiVUDine  (TRIUMEQ ) 600-50-300 MG tablet Take 1 tablet by mouth at bedtime. 30 tablet 6   albuterol  (PROVENTIL  HFA;VENTOLIN  HFA) 108 (90 Base) MCG/ACT inhaler Inhale 2 puffs into the lungs every 6 (six) hours as needed for wheezing or shortness of breath. 1 Inhaler 2  aspirin  EC 81 MG tablet Take 81 mg by mouth daily. Swallow whole.     benztropine  (COGENTIN ) 0.5 MG tablet Take 3 tablets by mouth daily.     brimonidine -timolol  (COMBIGAN ) 0.2-0.5 % ophthalmic solution Place 1 drop into both eyes every 12 (twelve) hours.     diazepam  (VALIUM ) 2 MG tablet Take 2 mg by mouth 3 (three) times daily.     dicyclomine  (BENTYL ) 10 MG capsule Take 10 mg by mouth 4 (four) times daily before meals and nightly     divalproex  (DEPAKOTE  ER) 500 MG 24 hr tablet Take 500 mg by mouth daily.     docusate sodium  (COLACE) 250 MG capsule Take 250 mg by mouth 2 (two) times daily.     Finerenone (KERENDIA) 10 MG TABS Take 1 tablet (10 mg total) by mouth daily. 30 tablet 5   Fluocinolone Acetonide 0.01 % OIL Place 2-4 drops in ear(s) every other day. At bedtime for dry skin     fluticasone  (FLOVENT  HFA) 220 MCG/ACT inhaler Inhale 2 puffs into the lungs 2 (two) times daily. Rinse out mouth afterwards 1 Inhaler 12   gabapentin  (NEURONTIN ) 300 MG capsule Take 2 capsules (600 mg total) by mouth every 8 (eight) hours. 180 capsule 0   glimepiride  (AMARYL ) 4 MG tablet Take 4 mg by mouth daily with breakfast.     glycopyrrolate  (ROBINUL ) 2 MG tablet Take 1 mg by  mouth 2 (two) times daily.     iloperidone  (FANAPT ) 4 MG TABS tablet Take 1 tablet (4 mg total) by mouth every 12 (twelve) hours. 60 tablet 0   insulin  lispro (HUMALOG) 100 UNIT/ML KwikPen Inject 14 Units into the skin 3 (three) times daily.     JARDIANCE  25 MG TABS tablet Take 25 mg by mouth daily.     meclizine  (ANTIVERT ) 25 MG tablet Take 1 tablet (25 mg total) by mouth 3 (three) times daily as needed for dizziness. 30 tablet 0   memantine  (NAMENDA ) 10 MG tablet Take 1 tablet by mouth 2 (two) times daily.     mirtazapine  (REMERON ) 30 MG tablet Take 1 tablet (30 mg total) by mouth at bedtime. 30 tablet 0   mometasone  (ELOCON ) 0.1 % ointment Apply 1 application  topically See admin instructions. Apply 4 drops to ear canal at bedtime every other night for itching and dry skin.     nitroGLYCERIN  (NITROSTAT ) 0.4 MG SL tablet Place 1 tablet (0.4 mg total) under the tongue every 5 (five) minutes as needed for chest pain. 20 tablet 5   omega-3 acid ethyl esters (LOVAZA ) 1 g capsule Take 1 capsule by mouth every morning.     omeprazole  (PRILOSEC) 40 MG capsule Take 40 mg by mouth 2 (two) times daily.     rosuvastatin  (CRESTOR ) 40 MG tablet Take 40 mg by mouth daily.      sucralfate  (CARAFATE ) 1 g tablet Take 1 tablet by mouth 4 (four) times daily.     tamsulosin  (FLOMAX ) 0.4 MG CAPS capsule Take 0.4 mg by mouth daily.     ULTICARE MINI PEN NEEDLES 31G X 6 MM MISC See admin instructions.     venlafaxine  XR (EFFEXOR -XR) 75 MG 24 hr capsule Take 1 capsule (75 mg total) by mouth daily with breakfast. 30 capsule 0   ziprasidone  (GEODON ) 60 MG capsule Take 60 mg by mouth 2 (two) times daily.     No current facility-administered medications for this visit.   Vitals:   04/26/24 0845  BP: 115/62  Pulse: 68  SpO2: 96%  Weight: 92.7 kg   Wt Readings from Last 3 Encounters:  04/26/24 92.7 kg  04/03/24 92.1 kg  03/29/24 94.3 kg   Lab Results  Component Value Date   CREATININE 1.68 (H) 04/19/2024    CREATININE 1.61 (H) 04/03/2024   CREATININE 1.65 (H) 03/29/2024     PHYSICAL EXAM:  General: Well appearing. No acute signs of distress.  Cor: No JVD. Regular rhythm, rate.  Lungs: Clear bilaterally. Symmetrical chest expansion.  Abdomen: Soft, nontender, nondistended. Extremities: No edema, rashes, or cyanosis.  Neuro: AO x3.  Affect pleasant.   ECG: not done   ASSESSMENT & PLAN:  1: NICM with preserved ejection fraction- - suspect due to HTN/ sleep apnea - NYHA class III - euvolemic today - weight down 3 pounds since visit 1 month ago  - Echo 07/25/20: EF of 55-60% along with mild LVH - Echo 07/03/22: EF  65-70% with mild LVH & mild MR.   - Echo 05/20/23: EF >55% with Grade II DD, normal RV, mild AR - not adding salt to his food and trying to watch the sodium content of his food - saw cardiology (Paraschos) 09/25 - continue jardiance  25mg  daily (DM dose) - continue kerendia 10mg  daily as EF >40% to help reduce risk of CV death and HF hospitalization, maintain at dose due GFR <60  - BMET in 1 week - BNP 07/10/22 was 20.9  2: HTN- - BP 115/62 - saw PCP Dois) 09/25 - BMP 04/19/24 reviewed: sodium 139, potassium 4.6, creatinine 1.68 and GFR 45 - BMET today  3: Diabetes- - A1c 03/01/24 reviewed and was 7.1% - saw endocrinologist GERALDTHEORA Laundry) 10/25 - saw nephrologist Murry) 09/25  4: OSA- - home sleep study was normal; no need to resume CPAP   Return in 3-4 months, sooner if needed.   Ellouise Class, FNP/Emauri Krygier, FNP-S 04/26/24

## 2024-04-25 NOTE — Telephone Encounter (Signed)
 Called to confirm/remind patient of their appointment at the [facility] on 04/26/24.   Appointment:   [x] Confirmed  [] Left mess   [] No answer/No voice mail  [] VM Full/unable to leave message  [] Phone not in service  Patient reminded to bring all medications and/or complete list.  Confirmed patient has transportation. Gave directions, instructed to utilize valet parking.

## 2024-04-26 ENCOUNTER — Encounter: Payer: Self-pay | Admitting: Family

## 2024-04-26 ENCOUNTER — Ambulatory Visit: Attending: Family | Admitting: Family

## 2024-04-26 VITALS — BP 115/62 | HR 68 | Wt 204.4 lb

## 2024-04-26 DIAGNOSIS — Z7982 Long term (current) use of aspirin: Secondary | ICD-10-CM | POA: Diagnosis not present

## 2024-04-26 DIAGNOSIS — Z7951 Long term (current) use of inhaled steroids: Secondary | ICD-10-CM | POA: Insufficient documentation

## 2024-04-26 DIAGNOSIS — Z7984 Long term (current) use of oral hypoglycemic drugs: Secondary | ICD-10-CM | POA: Insufficient documentation

## 2024-04-26 DIAGNOSIS — I471 Supraventricular tachycardia, unspecified: Secondary | ICD-10-CM | POA: Diagnosis not present

## 2024-04-26 DIAGNOSIS — J449 Chronic obstructive pulmonary disease, unspecified: Secondary | ICD-10-CM | POA: Diagnosis not present

## 2024-04-26 DIAGNOSIS — Z79624 Long term (current) use of inhibitors of nucleotide synthesis: Secondary | ICD-10-CM | POA: Diagnosis not present

## 2024-04-26 DIAGNOSIS — Z8673 Personal history of transient ischemic attack (TIA), and cerebral infarction without residual deficits: Secondary | ICD-10-CM | POA: Insufficient documentation

## 2024-04-26 DIAGNOSIS — I441 Atrioventricular block, second degree: Secondary | ICD-10-CM | POA: Diagnosis not present

## 2024-04-26 DIAGNOSIS — E118 Type 2 diabetes mellitus with unspecified complications: Secondary | ICD-10-CM

## 2024-04-26 DIAGNOSIS — I10 Essential (primary) hypertension: Secondary | ICD-10-CM

## 2024-04-26 DIAGNOSIS — I493 Ventricular premature depolarization: Secondary | ICD-10-CM | POA: Insufficient documentation

## 2024-04-26 DIAGNOSIS — B20 Human immunodeficiency virus [HIV] disease: Secondary | ICD-10-CM | POA: Insufficient documentation

## 2024-04-26 DIAGNOSIS — F32A Depression, unspecified: Secondary | ICD-10-CM | POA: Diagnosis not present

## 2024-04-26 DIAGNOSIS — I130 Hypertensive heart and chronic kidney disease with heart failure and stage 1 through stage 4 chronic kidney disease, or unspecified chronic kidney disease: Secondary | ICD-10-CM | POA: Insufficient documentation

## 2024-04-26 DIAGNOSIS — F419 Anxiety disorder, unspecified: Secondary | ICD-10-CM | POA: Diagnosis not present

## 2024-04-26 DIAGNOSIS — I509 Heart failure, unspecified: Secondary | ICD-10-CM | POA: Diagnosis not present

## 2024-04-26 DIAGNOSIS — G4733 Obstructive sleep apnea (adult) (pediatric): Secondary | ICD-10-CM | POA: Insufficient documentation

## 2024-04-26 DIAGNOSIS — I428 Other cardiomyopathies: Secondary | ICD-10-CM | POA: Diagnosis present

## 2024-04-26 DIAGNOSIS — Z79899 Other long term (current) drug therapy: Secondary | ICD-10-CM | POA: Insufficient documentation

## 2024-04-26 DIAGNOSIS — I252 Old myocardial infarction: Secondary | ICD-10-CM | POA: Insufficient documentation

## 2024-04-26 DIAGNOSIS — Z723 Lack of physical exercise: Secondary | ICD-10-CM | POA: Diagnosis not present

## 2024-04-26 DIAGNOSIS — Z794 Long term (current) use of insulin: Secondary | ICD-10-CM | POA: Diagnosis not present

## 2024-04-26 DIAGNOSIS — N189 Chronic kidney disease, unspecified: Secondary | ICD-10-CM | POA: Insufficient documentation

## 2024-04-26 DIAGNOSIS — I491 Atrial premature depolarization: Secondary | ICD-10-CM | POA: Insufficient documentation

## 2024-04-26 DIAGNOSIS — C469 Kaposi's sarcoma, unspecified: Secondary | ICD-10-CM | POA: Insufficient documentation

## 2024-04-26 DIAGNOSIS — E1165 Type 2 diabetes mellitus with hyperglycemia: Secondary | ICD-10-CM | POA: Insufficient documentation

## 2024-04-26 DIAGNOSIS — D649 Anemia, unspecified: Secondary | ICD-10-CM | POA: Diagnosis not present

## 2024-04-26 DIAGNOSIS — E1122 Type 2 diabetes mellitus with diabetic chronic kidney disease: Secondary | ICD-10-CM | POA: Diagnosis not present

## 2024-04-26 DIAGNOSIS — I440 Atrioventricular block, first degree: Secondary | ICD-10-CM | POA: Diagnosis not present

## 2024-04-26 NOTE — Patient Instructions (Signed)
 Medication Changes:  No medication changes today!  Lab Work:  Go over to the [facility]. Go pass the gift shop and have your blood work completed NEXT WEEK.    We will only call you if the results are abnormal or if the provider would like to make medication changes.  No news is good news.    Follow-Up in: Please follow up with the [facility] in 3-4 months with Ellouise Class, FNP.   Thank you for choosing [facility].    At the [facility], you and your health needs are our priority. We have a designated team specialized in the treatment of Heart Failure. This Care Team includes your primary Heart Failure Specialized Cardiologist (physician), Advanced Practice Providers (APPs- Physician Assistants and Nurse Practitioners), and Pharmacist who all work together to provide you with the care you need, when you need it.   You may see any of the following providers on your designated Care Team at your next follow up:  Dr. Toribio Fuel Dr. Ezra Shuck Dr. Ria Commander Dr. Morene Brownie Ellouise Class, FNP Jaun Bash, RPH-CPP  Please be sure to bring in all your medications bottles to every appointment.   Need to Contact Us :  If you have any questions or concerns before your next appointment please send us  a message through [place] or call our office at [phone].    TO LEAVE A MESSAGE FOR THE NURSE SELECT OPTION 2, PLEASE LEAVE A MESSAGE INCLUDING: YOUR NAME DATE OF BIRTH CALL BACK NUMBER REASON FOR CALL**this is important as we prioritize the call backs  YOU WILL RECEIVE A CALL BACK THE SAME DAY AS LONG AS YOU CALL BEFORE 4:00 PM

## 2024-05-04 ENCOUNTER — Other Ambulatory Visit
Admission: RE | Admit: 2024-05-04 | Discharge: 2024-05-04 | Disposition: A | Discharge status: Home / Self Care | Source: Ambulatory Visit | Attending: Family | Admitting: Family

## 2024-05-04 ENCOUNTER — Ambulatory Visit: Payer: Self-pay | Admitting: Family

## 2024-05-04 DIAGNOSIS — I428 Other cardiomyopathies: Secondary | ICD-10-CM | POA: Diagnosis present

## 2024-05-04 LAB — BASIC METABOLIC PANEL WITH GFR
Anion gap: 13 (ref 5–15)
BUN: 32 mg/dL — ABNORMAL HIGH (ref 8–23)
CO2: 23 mmol/L (ref 22–32)
Calcium: 9.1 mg/dL (ref 8.9–10.3)
Chloride: 104 mmol/L (ref 98–111)
Creatinine, Ser: 1.5 mg/dL — ABNORMAL HIGH (ref 0.61–1.24)
GFR, Estimated: 51 mL/min — ABNORMAL LOW (ref >60)
Glucose, Bld: 126 mg/dL — ABNORMAL HIGH (ref 70–99)
Potassium: 4 mmol/L (ref 3.5–5.1)
Sodium: 140 mmol/L (ref 135–145)

## 2024-05-16 ENCOUNTER — Telehealth: Payer: Self-pay

## 2024-05-16 NOTE — Telephone Encounter (Signed)
 Callers name: self Relation to patient:   Call back phone #:   Pharmacy (if applicable):   Issue/reason for call: pt called to say he has experienced chest pain after eating. Pt describes it as dull, heavy, fullness in middle of chest after he eats. Denies pain in neck, arms, back. Denies shortness of breath when occurring. States that pain is intermitted. States that pain has occurred once while on a walk but other than that it only happens after eating. Advised to follow up primary cards and possibly try an antacid.

## 2024-05-23 ENCOUNTER — Telehealth: Payer: Self-pay

## 2024-05-23 NOTE — Telephone Encounter (Signed)
 Per Ellouise Class, NP: Review instructions for when and how to take nitroglycerin  with instructions to call 911 if chest pain and shortness of breath gets worse or is not relieved after nitroglycerin . Attempt to have patient move up appt with primary cardiology, this clinic, or primary MD. Returned patient call to review provider recommendations. Pt verbalized understanding and agreement. Pt reiterates that he is unable to come to an appt this week due to transportation. He states he is willing and able to come in next Friday now, and we have scheduled for 12/5. Pt is aware to seek emergency help if symptoms worsen or become persistent.

## 2024-05-23 NOTE — Telephone Encounter (Signed)
 Patient called to ask if he could schedule an appointment. He reports increased episodes of shortness of breath, with sudden weakness, and feeling like something heavy is sitting on my chest. He reports these events are happening 4-5 times a day, and often happening after meals. He reports each event probably last 15-20 seconds and has not taken nitroglycerin  tablets for it. He states he did try indigestion medicines and they did not give relief.   He denies any sign of swelling or weight gain. He states his primary MD and primary cardiologist are aware and that he has appt on 12/12 with primary cardiologist. He states he is not available to come in for an appt this week or next week due to transportation and previously scheduled  appointments.

## 2024-06-03 ENCOUNTER — Other Ambulatory Visit: Payer: Self-pay

## 2024-06-03 ENCOUNTER — Emergency Department: Admission: EM | Admit: 2024-06-03 | Discharge: 2024-06-03 | Disposition: A | Discharge status: Home / Self Care

## 2024-06-03 ENCOUNTER — Ambulatory Visit: Admitting: Family

## 2024-06-03 ENCOUNTER — Emergency Department

## 2024-06-03 DIAGNOSIS — R197 Diarrhea, unspecified: Secondary | ICD-10-CM | POA: Insufficient documentation

## 2024-06-03 DIAGNOSIS — Z85831 Personal history of malignant neoplasm of soft tissue: Secondary | ICD-10-CM | POA: Insufficient documentation

## 2024-06-03 DIAGNOSIS — N189 Chronic kidney disease, unspecified: Secondary | ICD-10-CM | POA: Diagnosis not present

## 2024-06-03 DIAGNOSIS — R7989 Other specified abnormal findings of blood chemistry: Secondary | ICD-10-CM | POA: Diagnosis not present

## 2024-06-03 DIAGNOSIS — Z21 Asymptomatic human immunodeficiency virus [HIV] infection status: Secondary | ICD-10-CM | POA: Insufficient documentation

## 2024-06-03 DIAGNOSIS — I130 Hypertensive heart and chronic kidney disease with heart failure and stage 1 through stage 4 chronic kidney disease, or unspecified chronic kidney disease: Secondary | ICD-10-CM | POA: Insufficient documentation

## 2024-06-03 DIAGNOSIS — Z794 Long term (current) use of insulin: Secondary | ICD-10-CM | POA: Diagnosis not present

## 2024-06-03 DIAGNOSIS — R110 Nausea: Secondary | ICD-10-CM | POA: Diagnosis present

## 2024-06-03 DIAGNOSIS — E1122 Type 2 diabetes mellitus with diabetic chronic kidney disease: Secondary | ICD-10-CM | POA: Diagnosis not present

## 2024-06-03 DIAGNOSIS — I509 Heart failure, unspecified: Secondary | ICD-10-CM | POA: Diagnosis not present

## 2024-06-03 LAB — URINALYSIS, COMPLETE (UACMP) WITH MICROSCOPIC
Bacteria, UA: NONE SEEN
Bilirubin Urine: NEGATIVE
Glucose, UA: >=500 mg/dL — AB
Hgb urine dipstick: NEGATIVE
Ketones, ur: NEGATIVE mg/dL
Leukocytes,Ua: NEGATIVE
Nitrite: NEGATIVE
Protein, ur: 100 mg/dL — AB
Specific Gravity, Urine: 1.022 (ref 1.005–1.030)
pH: 6 (ref 5.0–8.0)

## 2024-06-03 LAB — CBC WITH DIFFERENTIAL/PLATELET
Abs Immature Granulocytes: 0.04 K/uL (ref 0.00–0.07)
Basophils Absolute: 0.1 K/uL (ref 0.0–0.1)
Basophils Relative: 1 %
Eosinophils Absolute: 0 K/uL (ref 0.0–0.5)
Eosinophils Relative: 0 %
HCT: 40.7 % (ref 39.0–52.0)
Hemoglobin: 14.2 g/dL (ref 13.0–17.0)
Immature Granulocytes: 1 %
Lymphocytes Relative: 33 %
Lymphs Abs: 2.5 K/uL (ref 0.7–4.0)
MCH: 31.6 pg (ref 26.0–34.0)
MCHC: 34.9 g/dL (ref 30.0–36.0)
MCV: 90.6 fL (ref 80.0–100.0)
Monocytes Absolute: 0.8 K/uL (ref 0.1–1.0)
Monocytes Relative: 10 %
Neutro Abs: 4.4 K/uL (ref 1.7–7.7)
Neutrophils Relative %: 55 %
Platelets: 207 K/uL (ref 150–400)
RBC: 4.49 MIL/uL (ref 4.22–5.81)
RDW: 13 % (ref 11.5–15.5)
WBC: 7.8 K/uL (ref 4.0–10.5)
nRBC: 0 % (ref 0.0–0.2)

## 2024-06-03 LAB — COMPREHENSIVE METABOLIC PANEL WITH GFR
ALT: 16 U/L (ref 0–44)
AST: 24 U/L (ref 15–41)
Albumin: 3.8 g/dL (ref 3.5–5.0)
Alkaline Phosphatase: 67 U/L (ref 38–126)
Anion gap: 9 (ref 5–15)
BUN: 17 mg/dL (ref 8–23)
CO2: 25 mmol/L (ref 22–32)
Calcium: 8.7 mg/dL — ABNORMAL LOW (ref 8.9–10.3)
Chloride: 105 mmol/L (ref 98–111)
Creatinine, Ser: 1.6 mg/dL — ABNORMAL HIGH (ref 0.61–1.24)
GFR, Estimated: 48 mL/min — ABNORMAL LOW (ref >60)
Glucose, Bld: 195 mg/dL — ABNORMAL HIGH (ref 70–99)
Potassium: 4.5 mmol/L (ref 3.5–5.1)
Sodium: 139 mmol/L (ref 135–145)
Total Bilirubin: 0.2 mg/dL (ref 0.0–1.2)
Total Protein: 6.4 g/dL — ABNORMAL LOW (ref 6.5–8.1)

## 2024-06-03 LAB — RESP PANEL BY RT-PCR (RSV, FLU A&B, COVID)  RVPGX2
Influenza A by PCR: NEGATIVE
Influenza B by PCR: NEGATIVE
Resp Syncytial Virus by PCR: NEGATIVE
SARS Coronavirus 2 by RT PCR: NEGATIVE

## 2024-06-03 LAB — BLOOD GAS, VENOUS
Acid-Base Excess: 2.9 mmol/L — ABNORMAL HIGH (ref 0.0–2.0)
Bicarbonate: 27.9 mmol/L (ref 20.0–28.0)
O2 Saturation: 94.7 %
Patient temperature: 37
pCO2, Ven: 43 mmHg — ABNORMAL LOW (ref 44–60)
pH, Ven: 7.42 (ref 7.25–7.43)
pO2, Ven: 67 mmHg — ABNORMAL HIGH (ref 32–45)

## 2024-06-03 LAB — TROPONIN T, HIGH SENSITIVITY
Troponin T High Sensitivity: 25 ng/L — ABNORMAL HIGH (ref 0–19)
Troponin T High Sensitivity: 26 ng/L — ABNORMAL HIGH (ref 0–19)

## 2024-06-03 LAB — PRO BRAIN NATRIURETIC PEPTIDE: Pro Brain Natriuretic Peptide: 274 pg/mL (ref <300.0)

## 2024-06-03 MED ORDER — METOCLOPRAMIDE HCL 5 MG/ML IJ SOLN
5.0000 mg | Freq: Once | INTRAMUSCULAR | Status: AC
  Administered 2024-06-03: 5 mg via INTRAVENOUS
  Filled 2024-06-03: qty 2

## 2024-06-03 MED ORDER — DIPHENHYDRAMINE HCL 50 MG/ML IJ SOLN
12.5000 mg | Freq: Once | INTRAMUSCULAR | Status: AC
  Administered 2024-06-03: 12.5 mg via INTRAVENOUS
  Filled 2024-06-03: qty 1

## 2024-06-03 MED ORDER — ONDANSETRON 4 MG PO TBDP: 4.0000 mg | ORAL_TABLET | Freq: Three times a day (TID) | ORAL | 0 refills | Status: AC | PRN

## 2024-06-03 NOTE — Discharge Instructions (Signed)

## 2024-06-03 NOTE — ED Notes (Signed)
 Patient given ginger ale.

## 2024-06-03 NOTE — ED Triage Notes (Signed)
 Patient arrives via [facility] from home. Patient called due to persistent nausea and diarrhea and 8/10 headache. Patient denies abdominal tenderness. Respirations even and unlabored, patient alert and oriented x4. Patient walked from EMS stretcher to ER stretcher with no assistance. MD Nicholaus at bedside.

## 2024-06-03 NOTE — ED Notes (Signed)
 Called CCMD to initiate cardiac monitoring.

## 2024-06-03 NOTE — ED Provider Notes (Signed)
 [facility] Provider Note    Event Date/Time   First MD Initiated Contact with Patient 06/03/24 1557     (approximate)   History   Nausea   HPI  Danny Vasudevan. is a 63 y.o. male  with pm history of CHF/preserved EF, HIV/history of Kaposi's sarcoma currently on Biktarvy with undetectable virus count, schizophrenia, OSA, hypertension, diabetes/insulin , anemia, CKD who presents to the emergency department with 1 day of nausea, headache, diarrhea.  He denies any abdominal pain.  He presents via EMS who states that his vitals were stable and route and he was administered 4 mg of intramuscular Zofran .  Patient denies any chest pain shortness of breath and states that his headache is improving.  Reports compliance of all medications.  No SI HI or AVH S      Physical Exam   Triage Vital Signs: ED Triage Vitals [06/03/24 1555]  Encounter Vitals Group     BP      Girls Systolic BP Percentile      Girls Diastolic BP Percentile      Boys Systolic BP Percentile      Boys Diastolic BP Percentile      Pulse Rate 77     Resp 19     Temp 98.1 F (36.7 C)     Temp Source Oral     SpO2      Weight      Height      Head Circumference      Peak Flow      Pain Score      Pain Loc      Pain Education      Exclude from Growth Chart     Most recent vital signs: Vitals:   06/03/24 1730 06/03/24 1745  BP:    Pulse: 70 72  Resp: 16 15  Temp:    SpO2: 95% 95%    Nursing Triage Note reviewed. Vital signs reviewed and patients oxygen saturation is normoxic  General: Patient is well nourished, well developed, awake and alert, resting comfortably in no acute distress Head: Normocephalic and atraumatic Eyes: Normal inspection, extraocular muscles intact, no conjunctival pallor Ear, nose, throat: Normal external exam Neck: Normal range of motion Respiratory: Patient is in no respiratory distress, lungs CTAB Cardiovascular: Patient is not tachycardic,  RRR without murmur appreciated GI: Abd SNT with no guarding or rebound  Back: Normal inspection of the back with good strength and range of motion throughout all ext Extremities: pulses intact with good cap refills, no LE pitting edema or calf tenderness Neuro: The patient is alert and oriented to person, place, and time, appropriately conversive, with 5/5 bilat UE/LE strength, no gross motor or sensory defects noted. Coordination appears to be adequate. Skin: Warm, dry, and intact Psych: normal mood and affect, no SI or HI  ED Results / Procedures / Treatments   Labs (all labs ordered are listed, but only abnormal results are displayed) Labs Reviewed  URINALYSIS, COMPLETE (UACMP) WITH MICROSCOPIC - Abnormal; Notable for the following components:      Result Value   Color, Urine YELLOW (*)    APPearance CLEAR (*)    Glucose, UA >=500 (*)    Protein, ur 100 (*)    All other components within normal limits  BLOOD GAS, VENOUS - Abnormal; Notable for the following components:   pCO2, Ven 43 (*)    pO2, Ven 67 (*)    Acid-Base Excess 2.9 (*)  All other components within normal limits  COMPREHENSIVE METABOLIC PANEL WITH GFR - Abnormal; Notable for the following components:   Glucose, Bld 195 (*)    Creatinine, Ser 1.60 (*)    Calcium  8.7 (*)    Total Protein 6.4 (*)    GFR, Estimated 48 (*)    All other components within normal limits  TROPONIN T, HIGH SENSITIVITY - Abnormal; Notable for the following components:   Troponin T High Sensitivity 25 (*)    All other components within normal limits  TROPONIN T, HIGH SENSITIVITY - Abnormal; Notable for the following components:   Troponin T High Sensitivity 26 (*)    All other components within normal limits  RESP PANEL BY RT-PCR (RSV, FLU A&B, COVID)  [id number]  CBC WITH DIFFERENTIAL/PLATELET  PRO BRAIN NATRIURETIC PEPTIDE     EKG EKG and rhythm strip are interpreted by myself:   EKG: [Normal sinus rhythm] at heart rate of 77,  normal QRS duration, QTc 442, normal ST segments and T waves no ectopy EKG not consistent with Acute STEMI Rhythm strip: NSR in lead II   RADIOLOGY CXR: No acute abnormality on my independent review interpretation radiologist agrees    PROCEDURES:  Critical Care performed: No  Procedures   MEDICATIONS ORDERED IN ED: Medications  diphenhydrAMINE  (BENADRYL ) injection 12.5 mg (12.5 mg Intravenous Given 06/03/24 1620)  metoCLOPramide  (REGLAN ) injection 5 mg (5 mg Intravenous Given 06/03/24 1619)     IMPRESSION / MDM / ASSESSMENT AND PLAN / ED COURSE                                Differential diagnosis includes, but is not limited to: URI, gastroenteritis, AKI, UTI, atypical ACS   ED course: Patient is well-appearing and EKG demonstrated no evidence of acute ischemia.  His normal vital signs and he had no acidosis on his VBG.  His creatinine is at baseline for him and he had no anion gap.  He had no leukocytosis or acute anemia.  Urinalysis was not consistent with UTI.  BNP was not significantly elevated.  His respiratory viral panel was negative.  He did have 2 troponins that were mildly elevated at 25 and 26 respectively which I do not think is consistent with acute ACS.  Patient felt improved after Benadryl  and Reglan  and was able to tolerate p.o. hydration.  He feels comfortable returning home and I did send him with prescription for Zofran  ODT's   Clinical Course as of 06/04/24 0016  Fri Jun 03, 2024  1659 WBC: 7.8 No leukocytosis [HD]  1659 Blood gas, venous(!) Blood gas and unremarkable [HD]  1700 DG Chest 2 View No acute abnormality [HD]  1745 Creatinine(!): 1.60 At patient's baseline [HD]  1748 Patient resting comfortably, taking p.o.  Awaiting second troponin and likely can go home [HD]  1834 Troponin T High Sensitivity(!): 26 Stable from previous [HD]  1846 Resp panel by RT-PCR (RSV, Flu A&B, Covid) Anterior Nasal Swab Negative [HD]  1846 Patient reassessed and  feels improved has been able to tolerate p.o.  Denies any headache.  Feels comfortable returning home.  Will send him with scripts for Zofran  and he will follow-up with primary care physician [HD]    Clinical Course User Index [HD] Nicholaus Rolland BRAVO, MD   At time of discharge there is no evidence of acute life, limb, vision, or fertility threat. Patient has stable vital signs, pain is well controlled,  patient is ambulatory and p.o. tolerant.  Discharge instructions were completed using the EPIC system. I would refer you to those at this time. All warnings prescriptions follow-up etc. were discussed in detail with the patient. Patient indicates understanding and is agreeable with this plan. All questions answered.  Patient is made aware that they may return to the emergency department for any worsening or new condition or for any other emergency.   -- Risk: 5 This patient has a high risk of morbidity due to further diagnostic testing or treatment. Rationale: This patient's evaluation and management involve a high risk of morbidity due to the potential severity of presenting symptoms, need for diagnostic testing, and/or initiation of treatment that may require close monitoring. The differential includes conditions with potential for significant deterioration or requiring escalation of care. Treatment decisions in the ED, including medication administration, procedural interventions, or disposition planning, reflect this level of risk. COPA: 5 The patient has the following acute or chronic illness/injury that poses a possible threat to life or bodily function: [X] : The patient has a potentially serious acute condition or an acute exacerbation of a chronic illness requiring urgent evaluation and management in the Emergency Department. The clinical presentation necessitates immediate consideration of life-threatening or function-threatening diagnoses, even if they are ultimately ruled out.   FINAL  CLINICAL IMPRESSION(S) / ED DIAGNOSES   Final diagnoses:  Nausea  Diarrhea, unspecified type     Rx / DC Orders   ED Discharge Orders          Ordered    ondansetron  (ZOFRAN -ODT) 4 MG disintegrating tablet  Every 8 hours PRN        06/03/24 1847             Note:  This document was prepared using Dragon voice recognition software and may include unintentional dictation errors.   Nicholaus Rolland BRAVO, MD 06/04/24 [phone]

## 2024-07-01 ENCOUNTER — Ambulatory Visit: Admitting: Family

## 2024-08-09 ENCOUNTER — Ambulatory Visit: Admitting: Family

## 2024-12-23 ENCOUNTER — Ambulatory Visit: Admitting: Physician Assistant
